# Patient Record
Sex: Male | Born: 1950
Health system: Southern US, Community
[De-identification: ages and names within clinical notes are randomized; demographics above are authoritative.]

## PROBLEM LIST (undated history)

## (undated) DIAGNOSIS — I219 Acute myocardial infarction, unspecified: Secondary | ICD-10-CM

## (undated) DIAGNOSIS — E119 Type 2 diabetes mellitus without complications: Secondary | ICD-10-CM

## (undated) DIAGNOSIS — Z720 Tobacco use: Secondary | ICD-10-CM

## (undated) DIAGNOSIS — I10 Essential (primary) hypertension: Secondary | ICD-10-CM

## (undated) DIAGNOSIS — F1910 Other psychoactive substance abuse, uncomplicated: Secondary | ICD-10-CM

## (undated) DIAGNOSIS — E785 Hyperlipidemia, unspecified: Secondary | ICD-10-CM

## (undated) DIAGNOSIS — J449 Chronic obstructive pulmonary disease, unspecified: Secondary | ICD-10-CM

## (undated) DIAGNOSIS — I4892 Unspecified atrial flutter: Secondary | ICD-10-CM

## (undated) DIAGNOSIS — F329 Major depressive disorder, single episode, unspecified: Secondary | ICD-10-CM

## (undated) DIAGNOSIS — Z89612 Acquired absence of left leg above knee: Secondary | ICD-10-CM

## (undated) DIAGNOSIS — F319 Bipolar disorder, unspecified: Secondary | ICD-10-CM

## (undated) DIAGNOSIS — R350 Frequency of micturition: Secondary | ICD-10-CM

## (undated) DIAGNOSIS — R7302 Impaired glucose tolerance (oral): Secondary | ICD-10-CM

## (undated) DIAGNOSIS — M1990 Unspecified osteoarthritis, unspecified site: Secondary | ICD-10-CM

## (undated) DIAGNOSIS — Z86718 Personal history of other venous thrombosis and embolism: Secondary | ICD-10-CM

## (undated) DIAGNOSIS — E559 Vitamin D deficiency, unspecified: Secondary | ICD-10-CM

## (undated) DIAGNOSIS — F419 Anxiety disorder, unspecified: Secondary | ICD-10-CM

## (undated) DIAGNOSIS — I2510 Atherosclerotic heart disease of native coronary artery without angina pectoris: Secondary | ICD-10-CM

## (undated) DIAGNOSIS — R0602 Shortness of breath: Secondary | ICD-10-CM

## (undated) DIAGNOSIS — I739 Peripheral vascular disease, unspecified: Secondary | ICD-10-CM

## (undated) DIAGNOSIS — I4891 Unspecified atrial fibrillation: Secondary | ICD-10-CM

## (undated) DIAGNOSIS — F32A Depression, unspecified: Secondary | ICD-10-CM

## (undated) HISTORY — DX: Bipolar disorder, unspecified: F31.9

## (undated) HISTORY — DX: Tobacco use: Z72.0

## (undated) HISTORY — DX: Depression, unspecified: F32.A

## (undated) HISTORY — PX: ELECTROCARDIOGRAM: SHX264

## (undated) HISTORY — PX: CARDIAC CATHETERIZATION: SHX172

## (undated) HISTORY — PX: OTHER SURGICAL HISTORY: SHX169

## (undated) HISTORY — DX: Hyperlipidemia, unspecified: E78.5

## (undated) HISTORY — DX: Major depressive disorder, single episode, unspecified: F32.9

## (undated) HISTORY — DX: Chronic obstructive pulmonary disease, unspecified: J44.9

## (undated) HISTORY — DX: Atherosclerotic heart disease of native coronary artery without angina pectoris: I25.10

## (undated) HISTORY — DX: Acute myocardial infarction, unspecified: I21.9

## (undated) HISTORY — DX: Impaired glucose tolerance (oral): R73.02

## (undated) HISTORY — DX: Personal history of other venous thrombosis and embolism: Z86.718

## (undated) HISTORY — PX: VASECTOMY: SHX75

---
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
## 2000-07-26 ENCOUNTER — Ambulatory Visit (HOSPITAL_COMMUNITY): Admission: RE | Admit: 2000-07-26 | Discharge: 2000-07-26 | Payer: Self-pay

## 2006-07-22 ENCOUNTER — Inpatient Hospital Stay (HOSPITAL_COMMUNITY): Admission: EM | Admit: 2006-07-22 | Discharge: 2006-07-28 | Payer: Self-pay | Admitting: *Deleted
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
## 2010-07-04 ENCOUNTER — Inpatient Hospital Stay (HOSPITAL_COMMUNITY)
Admission: EM | Admit: 2010-07-04 | Discharge: 2010-07-07 | DRG: 249 | Disposition: A | Discharge status: Home / Self Care | Payer: Self-pay | Source: Other Acute Inpatient Hospital | Attending: Internal Medicine | Admitting: Internal Medicine

## 2010-07-04 DIAGNOSIS — I498 Other specified cardiac arrhythmias: Secondary | ICD-10-CM | POA: Diagnosis not present

## 2010-07-04 DIAGNOSIS — F17200 Nicotine dependence, unspecified, uncomplicated: Secondary | ICD-10-CM | POA: Diagnosis present

## 2010-07-04 DIAGNOSIS — R7401 Elevation of levels of liver transaminase levels: Secondary | ICD-10-CM | POA: Diagnosis present

## 2010-07-04 DIAGNOSIS — I2119 ST elevation (STEMI) myocardial infarction involving other coronary artery of inferior wall: Principal | ICD-10-CM | POA: Diagnosis present

## 2010-07-04 DIAGNOSIS — F319 Bipolar disorder, unspecified: Secondary | ICD-10-CM | POA: Diagnosis present

## 2010-07-04 DIAGNOSIS — Z86718 Personal history of other venous thrombosis and embolism: Secondary | ICD-10-CM

## 2010-07-04 DIAGNOSIS — Y840 Cardiac catheterization as the cause of abnormal reaction of the patient, or of later complication, without mention of misadventure at the time of the procedure: Secondary | ICD-10-CM | POA: Diagnosis present

## 2010-07-04 DIAGNOSIS — J4489 Other specified chronic obstructive pulmonary disease: Secondary | ICD-10-CM | POA: Diagnosis present

## 2010-07-04 DIAGNOSIS — I2510 Atherosclerotic heart disease of native coronary artery without angina pectoris: Secondary | ICD-10-CM | POA: Diagnosis present

## 2010-07-04 DIAGNOSIS — J449 Chronic obstructive pulmonary disease, unspecified: Secondary | ICD-10-CM | POA: Diagnosis present

## 2010-07-04 DIAGNOSIS — I9589 Other hypotension: Secondary | ICD-10-CM | POA: Diagnosis not present

## 2010-07-04 DIAGNOSIS — R7402 Elevation of levels of lactic acid dehydrogenase (LDH): Secondary | ICD-10-CM | POA: Diagnosis present

## 2010-07-04 DIAGNOSIS — E785 Hyperlipidemia, unspecified: Secondary | ICD-10-CM | POA: Diagnosis present

## 2010-07-04 LAB — CARDIAC PANEL(CRET KIN+CKTOT+MB+TROPI)
Relative Index: 12.8 — ABNORMAL HIGH (ref 0.0–2.5)
Total CK: 1527 U/L — ABNORMAL HIGH (ref 7–232)

## 2010-07-04 LAB — MRSA PCR SCREENING: MRSA by PCR: NEGATIVE

## 2010-07-05 LAB — CBC
HCT: 39 % (ref 39.0–52.0)
Hemoglobin: 14 g/dL (ref 13.0–17.0)
MCH: 31.4 pg (ref 26.0–34.0)
MCHC: 35.9 g/dL (ref 30.0–36.0)
MCV: 87.4 fL (ref 78.0–100.0)
RDW: 12.5 % (ref 11.5–15.5)

## 2010-07-05 LAB — COMPREHENSIVE METABOLIC PANEL
ALT: 41 U/L (ref 0–53)
Alkaline Phosphatase: 51 U/L (ref 39–117)
BUN: 8 mg/dL (ref 6–23)
Chloride: 104 mEq/L (ref 96–112)
GFR calc Af Amer: >60 mL/min (ref >60)
Glucose, Bld: 107 mg/dL — ABNORMAL HIGH (ref 70–99)
Potassium: 3.6 mEq/L (ref 3.5–5.1)
Sodium: 137 mEq/L (ref 135–145)
Total Bilirubin: 0.5 mg/dL (ref 0.3–1.2)
Total Protein: 5.8 g/dL — ABNORMAL LOW (ref 6.0–8.3)

## 2010-07-05 LAB — HEMOGLOBIN A1C
Hgb A1c MFr Bld: 6.2 % — ABNORMAL HIGH (ref <5.7)
Mean Plasma Glucose: 131 mg/dL — ABNORMAL HIGH (ref <117)

## 2010-07-05 LAB — LIPID PANEL
Total CHOL/HDL Ratio: 4.2 RATIO
VLDL: 23 mg/dL (ref 0–40)

## 2010-07-05 LAB — TSH: TSH: 0.242 u[IU]/mL — ABNORMAL LOW (ref 0.350–4.500)

## 2010-07-05 LAB — CARDIAC PANEL(CRET KIN+CKTOT+MB+TROPI): CK, MB: 192.1 ng/mL (ref 0.3–4.0)

## 2010-07-06 LAB — COMPREHENSIVE METABOLIC PANEL
AST: 52 U/L — ABNORMAL HIGH (ref 0–37)
Albumin: 3 g/dL — ABNORMAL LOW (ref 3.5–5.2)
Calcium: 7.5 mg/dL — ABNORMAL LOW (ref 8.4–10.5)
Creatinine, Ser: 0.73 mg/dL (ref 0.50–1.35)
Sodium: 139 mEq/L (ref 135–145)

## 2010-07-06 LAB — CBC
MCH: 31.1 pg (ref 26.0–34.0)
MCV: 88.9 fL (ref 78.0–100.0)
Platelets: 199 10*3/uL (ref 150–400)
RDW: 12.6 % (ref 11.5–15.5)
WBC: 10.8 10*3/uL — ABNORMAL HIGH (ref 4.0–10.5)

## 2010-07-06 LAB — T4, FREE: Free T4: 1.02 ng/dL (ref 0.80–1.80)

## 2010-07-07 DIAGNOSIS — I2510 Atherosclerotic heart disease of native coronary artery without angina pectoris: Secondary | ICD-10-CM

## 2010-07-10 NOTE — Cardiovascular Report (Signed)
NAMEEMRAN, MOLZAHN NO.:  [id number]  MEDICAL RECORD NO.:  [id number]  LOCATION:  2906                         FACILITY:  [facility]  PHYSICIAN:  Nicki Guadalajara, M.D.     DATE OF BIRTH:  09-22-1950  DATE OF PROCEDURE:  07/04/2010 DATE OF DISCHARGE:                           CARDIAC CATHETERIZATION   INDICATIONS:  Mr. Vedanth Sirico is a 60 year old gentleman who has a history of bipolar disorder and ongoing 2-pack-per-day tobacco use.  The patient has been having stuttering chest pain for the past 2 days. Apparently today, he presented to [facility] Emergency Room.  An ECG showed ST-segment elevation inferior wall myocardial infarction.  A code STEMI was called and the patient was transported to [facility].  Upon arrival to [facility], the patient still had recurrent chest tightness.  He was bradycardic with pulses in the low 40s, which in one occasion had dropped into the 30s.  His right femoral artery was punctured anteriorly, and a 6-French arterial sheath and 7-French venous sheath were inserted without difficulty.  Due to the patient's significant bradycardia, a transvenous temporary pacemaker was inserted and advanced to the RV apex with excellent capture and thresholds.  A 6- [place] diagnostic left catheter was then inserted and selective angiography was performed.  A right guiding catheter, 6-French was then inserted and angiography confirmed total mid RCA occlusion with TIMI 0 flow.  Apparently, the patient was treated with 600 mg of Plavix prior to transfer from [facility].  He was started on bivalirudin in the laboratory.  Asahi medium wire was advanced down the RCA and was able to cross the occlusion partially.  A 2.5 x 15 mm Emerge Monorail balloon was then inserted.  This provide additional support to totally cross the lesion and several dilatations were performed at 2,4, 8 and 10 atmospheres.  After initial opening, there was  significant clot burden. An Expressway thrombectomy catheter was then inserted and 3 runs were made with significant clot retrieval.  A 3.0 x 15-mm Trek balloon was then used for additional dilatation prior to stenting.  Due to concerns of potential medical compliance with Plavix long-term with a large- caliber RCA, decision was made to use a bare-metal stent.  A Multilink Vision 3.5 x 18-mm stent was then inserted and successfully deployed x2 up to 13 atmospheres.  A 4.0 x 12-mm noncompliant Trek was used for post- stent dilatation with dilatation up to 3.91 mm.  The patient did have transient hypotension at the start of the study requiring initiation of dopamine up to 4 mcg.  Scout angiography confirmed an excellent angiographic result.  A 6-French pigtail catheter was then inserted and left ventriculography was performed.  Distal aortography was done.  HEMODYNAMIC DATA:  Central aortic pressure was initially 75/54 at the completion of the procedure, central aortic pressure was approximately 100/60 and left ventricular pressure was 103/6.  Post A-wave 16.  ANGIOGRAPHIC DATA:  The left main coronary artery was a large vessel, which bifurcated into an LAD and left circumflex system.  There was mild 10-20% distal taper of the left main.  The LAD was a moderate-sized vessel that gave rise to a small  high diagonal ramus intermediate-like vessel.  The proximal LAD had irregularity with narrowing of 40% to less than 50% in the region of the first septal perforating artery takeoff.  In the midportion of the LAD, there was luminal irregularity with narrowings of 20-30%.  LAD extended to the apex.  Collateralization of PLA branch of the RCA was visualized from the left injection.  The circumflex vessel was moderate-sized vessel that gave rise to one large diagonal vessel and also supplied left atrial circumflex branch.  The right coronary artery was a large-caliber vessel that had  30% proximal narrowing.  The RCA was totally occluded in its midsegment after the takeoff of small anterior RV marginal branch.  There was initial TIMI 0 flow.  As noted above, temporary pacemaker was in place for the intervention and the patient was started on dopamine for hypotension and fluids were increased.  Following initial dilatation at the initial occluded site, there was significant thrombus burden beyond this segment.  Expressway thrombectomy was performed with significant clot removal.  Following additional dilatation and subsequent stenting with a 3.5 x 18-mm bare- metal Vision stent, postdilated to 3.91 mm, the 100% occlusion was reduced to 0%.  There was now brisk TIMI 3 flow and the vessel supplied a large PDA and posterolateral vessel.  RAO ventriculography revealed mild LV dysfunction acutely with an ejection fraction of approximately 50% with mild inferior hypocontractility.  Distal aortography revealed widely patent renal arteries without significant aortoiliac disease.  IMPRESSION: 1. Acute ST-segment elevation inferior wall myocardial infarction     secondary to total occlusion of the mid right coronary artery. 2. Transient bradycardia to heart rate down to 38 requiring temporary     pacemaker insertion. 3. Hypotension requiring inotropic support with dopamine. 4. Mild 10-20% distal tapering of the left main, with 40-50% proximal     left anterior descending stenoses and luminal irregularities of 20-     30% in the mid segment of the left anterior descending with initial     left-to-right collateralization to the posterolateral artery branch     of the RCA. 5. Normal left circumflex coronary artery. 6. Total occlusion of the mid right coronary artery with initial TIMI     0 flow. 7. Successful percutaneous coronary intervention requiring initial     thrombectomy, percutaneous transluminal coronary angioplasty and     ultimate stenting with a 3.5 x 18-mm  bare-metal Vision stent,     postdilated 3.91 mm. 8. Because of haziness and clot during the procedure, the patient was     also started on Integrilin in addition to bivalirudin and had     received 600 mg oral Plavix prior to transfer to [facility].  ESTIMATED DOOR-TO-BALLOON TIME:  39 minutes.          ______________________________ Nicki Guadalajara, M.D.     TK/MEDQ  D:  07/04/2010  T:  07/05/2010  Job:  [id number]  cc:   Bevelyn Buckles. Bensimhon, MD  Electronically Signed by Nicki Guadalajara M.D. on 07/10/2010 04:30:52 PM

## 2010-07-13 NOTE — Discharge Summary (Addendum)
Anthony [name], [name] NO.:  [id number]  MEDICAL RECORD NO.:  [id number]  LOCATION:  2003                         FACILITY:  [facility]  PHYSICIAN:  Marca Ancona, MD      DATE OF BIRTH:  09/26/1950  DATE OF ADMISSION:  07/04/2010 DATE OF DISCHARGE:  07/07/2010                              DISCHARGE SUMMARY   DISCHARGE DIAGNOSES: 1. Newly diagnosed coronary artery disease this admission with acute     inferior ST-elevation myocardial infarction.     a.     Status post percutaneous coronary intervention with      thrombectomy, percutaneous transluminal coronary angioplasty, and      bare-metal stent to the mid right coronary artery. 2. Bradycardia requiring temporary pacemaker in the cath lab, on July 04, 2010, currently tolerating low-dose beta-blocker. 3. Hypotension, requiring dopamine, stable. 4. Bipolar disorder with stable mental status this admission. 5. History of traumatic deep vein thrombosis, off Coumadin. 6. Ongoing tobacco abuse. 7. Mild transaminitis, improved. 8. Dyslipidemia, total cholesterol 164, triglycerides 114, HDL 40, LDL     104. 9. Abnormal initial TSH was 0.242, repeat TSH with free T4 normal.  HOSPITAL COURSE:  Mr. Anthony [name] is a 60 year old gentleman with history of hyperlipidemia, bipolar disorder, and COPD with ongoing tobacco abuse as well as prior history of DVT.  He developed chest pain on the day of admission, but per report, initially ignored it, went to [employer] to drop off the books.  However, the chest pain got so bad, so he presented to the [facility] ER and there, EKG showed inferolateral ST elevation. Code STEMI was activated.  In the ER, he was treated with morphine, heparin, and 600 mg of Plavix.  He was subsequently transferred to [facility] and upon arrival, was still having chest pain approximately 4-5/10.  He underwent emergent cardiac catheterization by Dr. Tresa Endo who was the STEMI doctor on-call,  demonstrating total occlusion of the mid RCA which was subsequently intervened upon with thrombectomy, PTCA, and ultimately stenting with a bare-metal stent. Because of haziness and clot during the procedure, the patient was started on Integrilin in addition to Angiomax.  He had nonobstructive residual disease in the left main, proximal LAD, and mid segment of the LAD.  He also had transient bradycardia with heart rate down to 38, requiring temporary pacemaker implantation.  The morning after procedure, the patient was doing well.  He was no longer relying on a temporary pacemaker with intrinsic rate in the mid 50s.  The temporary pacemaker was discontinued, and his dopamine was weaned down.  He was seen by cardiac rehab as well as tobacco cessation for education and ambulation.  His TSH was checked which was noted to be suppressed, however, repeat TSH and free T4 were normal.  He had mild transaminitis as well but this was thought secondary to his MI and repeat check demonstrated an AST of 52 and normal ALT.  On the day of discharge, the patient is doing well without chest pain.  Dr. Shirlee Latch has seen and examined him and feels he is stable for discharge today.  He has also been seen by  case Production designer, theatre/television/film for the [employer].  DISCHARGE LABS:  WBC 10.8, hemoglobin 14.3, hematocrit 40.9, platelet count 199.  Sodium 139, potassium 4.2, chloride 106, CO2 24, glucose 101, BUN 13, creatinine 0.73.  A1c 6.2.  Troponin greater than 25, total cholesterol 167, triglycerides 114, HDL 40, LDL 104.  TSH 1.157, free T4 1.02.  STUDIES:  Cardiac catheterization on July 04, 2010, please see full report for details.  DISCHARGE MEDICATIONS: 1. Lovastatin 40 mg at bedtime. 2. Metoprolol tartrate 25 mg 1/2 tablet b.i.d. 3. Nicotine patch 21-mg patch 1 daily for 6 weeks, 14 -mg patch 1     daily for 2 weeks, 7-mg patch 1 daily for 2 weeks. 4. Nitro sublingual 0.4 mg every 5 minutes as needed up  to 3 doses for     chest pain. 5. Effient 10 mg daily. 6. Aleve 220 mg daily as needed with instructions to only use     sparingly as this medicine can increase the risk of systemic     bleeding with medicines like aspirin and Plavix. 7. Aspirin 81 mg daily. 8. Multivitamin 1 tablet daily. 9. Xanax 1 mg daily as needed for anxiety.  DISPOSITION:  Anthony [name] will be discharged in stable condition home.  He is not to lift anything over 5 pounds for 1 week, drive for 2 days, or participate in sexual activity for 1 week.  He is to follow a heart- healthy diet.  Per discussion with Dr. Shirlee Latch, he may return to work in 2 weeks.  He denies needing work note for that.  He is to call or return if he notices any pain, swelling, bleeding, or pus at the cath site.  He will follow up with [facility] in our [place] office and we will arrange this appointment.  He lives in [place] postal address, but actually is only 12 miles from the [place] office.  He is also instructed to follow up with his primary care provider for continued to surveillance of his blood sugar as his A1c was elevated this admission at 6.2.DURATION OF DISCHARGE ENCOUNTER:  Greater than 30 minutes including physician and PA time.     Ronie Spies, P.A.C.   ______________________________ Marca Ancona, MD    DD/MEDQ  D:  07/07/2010  T:  07/07/2010  Job:  [id number]  cc:   [facility]  Electronically Signed by Ronie Spies  on 07/13/2010 01:45:04 PM Electronically Signed by Marca Ancona MD on 07/27/2010 01:28:48 PM

## 2010-07-13 NOTE — H&P (Signed)
NAMEPHYLLIS, WHITEFIELD NO.:  [id number]  MEDICAL RECORD NO.:  [id number]  LOCATION:  [facility]                         FACILITY:  [facility]  Anthony [name]:  Bevelyn Buckles. Bensimhon, MDDATE OF BIRTH:  1950-06-14  DATE OF ADMISSION:  07/04/2010 DATE OF DISCHARGE:                             HISTORY & PHYSICAL   PRIMARY CARE Anthony [name]:  [facility].  CARDIOLOGY:  He is new to [facility].  HISTORY OF PRESENT ILLNESS:  Mr. Beirne is a 60 year old male with history of hyperlipidemia, bipolar disorder, and COPD with ongoing tobacco use 2 packs per day and previous DVT.  He denies any history of known coronary artery disease.  He has had stuttering chest pain, since Sunday.  Today, the pain got worse.  He finally went to the [facility] ER. In the ER, the EKG showed inferolateral ST-elevation and a code STEMI was activated.  In the ER, he was treated with morphine, heparin, and 600 mg of Plavix.  He was transferred emergently to [facility] Lab.  On arrival, he continues to have some chest pain, this is mild about 4- 5/10.  REVIEW OF SYSTEMS:  He denies any heart failure symptoms.  No bleeding. No palpitations.  He does have bipolar disorder with depression.  He has not been on any meds for this.  Remainder review of systems are negative except for HPI and problem list.  PROBLEM LIST: 1. Hyperlipidemia. 2. Bipolar disorder. 3. COPD with ongoing tobacco use. 4. History of traumatic DVT several years ago, treated with Coumadin,     now off.  CURRENT MEDICATIONS:  Xanax.  ALLERGIES:  PENICILLIN.  SOCIAL HISTORY:  He lives in [place].  He lives alone.  He has kids, but is not married.  Tobacco 2 packs per day.  Alcohol occasional.  FAMILY HISTORY:  Both mother and father are deceased.  Father had a history of coronary artery disease.  PHYSICAL EXAMINATION:  GENERAL:  He is lying on a cath lab in moderate distress. VITAL SIGNS:  Blood pressure  95/61 and heart rate 45. HEENT:  Normal except for poor dentition. NECK:  Supple.  Carotids are 1+ bilaterally with no obvious bruits. CARDIAC:  PMI is not palpable.  He has distant heart sounds.  No obvious murmur. LUNGS:  Decreased breath sounds throughout. ABDOMEN: Soft and nontender.  Good bowel sounds. EXTREMITIES:  Warm with no cyanosis or clubbing. NEUROLOGIC:  He is alert and oriented x3.  Cranial nerves II through XII are grossly intact.  Moves all 4 extremities without difficulty.  Affect is reasonable.  LABORATORY DATA:  White count is 13.3, hemoglobin 16.0, and platelets are 277,000.  Sodium 136, potassium 3.9, BUN 10, creatinine 0.9, and glucose 109.  CK is 445, MB is 29, and troponin is 0.64.  EKG shows sinus rhythm at a rate of 63 with inferolateral ST elevation.  ASSESSMENT: 1. Inferolateral ST elevation myocardial infarction. 2. Chronic obstructive pulmonary disease with ongoing tobacco use. 3. Bipolar disorder. 4. Hyperlipidemia. 5. History of traumatic deep vein thrombosis several years ago.  PLAN/DISCUSSION:  We will proceed with emergent cardiac catheterization with Dr. Tresa Endo.  He will need a smoking cessation consult  and aggressive risk factor management after his catheterization.     Bevelyn Buckles. Bensimhon, MD     DRB/MEDQ  D:  07/04/2010  T:  07/04/2010  Job:  [id number]  Electronically Signed by Arvilla Meres MD on 07/13/2010 03:31:07 PM

## 2010-07-25 ENCOUNTER — Encounter: Payer: Self-pay | Admitting: Physician Assistant

## 2010-07-26 ENCOUNTER — Encounter: Payer: Self-pay | Admitting: Physician Assistant

## 2010-07-27 ENCOUNTER — Encounter: Payer: Self-pay | Admitting: *Deleted

## 2010-07-27 ENCOUNTER — Encounter: Payer: Self-pay | Admitting: Internal Medicine

## 2010-07-28 ENCOUNTER — Encounter: Payer: Self-pay | Admitting: Physician Assistant

## 2010-07-28 ENCOUNTER — Ambulatory Visit (INDEPENDENT_AMBULATORY_CARE_PROVIDER_SITE_OTHER): Payer: Self-pay | Admitting: Physician Assistant

## 2010-07-28 VITALS — BP 128/84 | HR 67 | Resp 16 | Ht 70.0 in | Wt 187.0 lb

## 2010-07-28 DIAGNOSIS — F32A Depression, unspecified: Secondary | ICD-10-CM | POA: Insufficient documentation

## 2010-07-28 DIAGNOSIS — E785 Hyperlipidemia, unspecified: Secondary | ICD-10-CM | POA: Insufficient documentation

## 2010-07-28 DIAGNOSIS — F329 Major depressive disorder, single episode, unspecified: Secondary | ICD-10-CM | POA: Insufficient documentation

## 2010-07-28 DIAGNOSIS — Z720 Tobacco use: Secondary | ICD-10-CM | POA: Insufficient documentation

## 2010-07-28 DIAGNOSIS — I2510 Atherosclerotic heart disease of native coronary artery without angina pectoris: Secondary | ICD-10-CM | POA: Insufficient documentation

## 2010-07-28 DIAGNOSIS — J449 Chronic obstructive pulmonary disease, unspecified: Secondary | ICD-10-CM | POA: Insufficient documentation

## 2010-07-28 DIAGNOSIS — J441 Chronic obstructive pulmonary disease with (acute) exacerbation: Secondary | ICD-10-CM | POA: Insufficient documentation

## 2010-07-28 DIAGNOSIS — E1169 Type 2 diabetes mellitus with other specified complication: Secondary | ICD-10-CM | POA: Insufficient documentation

## 2010-07-28 DIAGNOSIS — F17200 Nicotine dependence, unspecified, uncomplicated: Secondary | ICD-10-CM

## 2010-07-28 LAB — BASIC METABOLIC PANEL
CO2: 28 mEq/L (ref 19–32)
Calcium: 9.2 mg/dL (ref 8.4–10.5)
Chloride: 104 mEq/L (ref 96–112)
Potassium: 4.4 mEq/L (ref 3.5–5.1)
Sodium: 139 mEq/L (ref 135–145)

## 2010-07-28 NOTE — Assessment & Plan Note (Signed)
Followup with his psychiatrist for continued evaluation.

## 2010-07-28 NOTE — Assessment & Plan Note (Signed)
He understands the importance of cessation.  I have reminded him to not smoke with the nicotine patch on.

## 2010-07-28 NOTE — Assessment & Plan Note (Signed)
Arrange for lipids and LFTs through the [facility] Department in 6-8 weeks

## 2010-07-28 NOTE — Assessment & Plan Note (Signed)
Repeat basic metabolic panel today

## 2010-07-28 NOTE — Assessment & Plan Note (Signed)
Doing well post inferior MI.  He had preserved LV function at cardiac catheterization.  He is tolerating his medications well.  Continue aspirin, Effient, statin and beta blocker.  I will provide him with Effient samples today if available.  We will check on his Effient assistance paperwork to make sure that this is completed.  I will refer him to [facility] cardiac rehabilitation.  Followup with Dr. Gala Romney in 2 months.

## 2010-07-28 NOTE — Patient Instructions (Signed)
Your physician recommends that you schedule a follow-up appointment in: 2 MONTHS WITH DR. Gala Romney AS PER SCOTT WEAVER, PA-C  You have been referred to CARDIAC REHAB AT [place] FOR CAD 414.00   DO NOT SMOKE WHILE USING THE NICOTINE PATCH.  Your physician recommends that you return for lab work in: TODAY BMET 275.41

## 2010-07-28 NOTE — Progress Notes (Signed)
History of Present Illness: Primary Cardiologist:  Dr. Arvilla Meres PCP:  [facility] Psych:  [place] [facility]  Anthony [name] is a 61 y.o. male who presents for post hospital follow up.  He presented to [facility] on 6/26 in transfer from [facility] with an inferior ST elevation myocardial infarction.  He was taken to the cardiac catheterization lab.  Cardiac catheterization demonstrated a totally occluded mid RCA.  This was treated with a Vision bare metal stent.  Residual disease included 10-20% distal left main stenosis, 40-50% proximal LAD and 20-30% mid LAD, 30% proximal RCA.  Left ventriculogram demonstrated inferior hypokinesis with an EF of 50%.  He did have hypotension and bradycardia post PCI.  This required temporary pacemaker implantation and dopamine.  He was eventually weaned off of both of these and maintained an adequate blood pressure and heart rate.  He was started on lovastatin for dyslipidemia.  His LDL was 104.  He was noted to have elevated sugars with a A1c of 6.2 (glucose intolerance).  He was set up with assistance for Effient.  He returns for followup.  Hospital labs: Potassium 4.2, creatinine 0.73, calcium 7.5, hemoglobin 14.3, ALT 34, A1c 6.2, TSH 1.157, TC 167, TG 114, HDL 40, LDL 104; peak troponin greater than 25.  He denies any further chest discomfort consistent with his angina.  He did have one very brief episode of left-sided chest discomfort.  He denies any exertional symptoms.  He is walking infrequently, but without difficulty.  He denies significant shortness of breath.  He is plagued mainly by depression.  He has difficulty with sleeping.  He is due to see a psychiatrist back next week.  The only psych medication he currently takes is Xanax.  This is not helping him sleep.  He denies orthopnea, PND or edema.  He denies syncope or palpitations.  Past Medical History  Diagnosis Date  . CAD (coronary artery disease)     a. INF STEMI 07/04/10:  tx with thrombectomy + Vision BMS to [facility];  cath 07/04/10: dLM 10-20%, pLAD 40-50%, mLAD 20-30%, pRCA 30%, mRCA occluded and tx with PCI, EF 50% with inf HK  . HLD (hyperlipidemia)   . COPD (chronic obstructive pulmonary disease)   . Tobacco abuse   . Bipolar disorder   . History of DVT (deep vein thrombosis)     traumatic, s/p coumadin tx.  . Glucose intolerance (impaired glucose tolerance)     A1c 6.2 06/2010  . Depression     Current Outpatient Prescriptions  Medication Sig Dispense Refill  . ALPRAZolam (XANAX) 0.5 MG tablet Take 0.5 mg by mouth at bedtime as needed.        Marland Kitchen aspirin 81 MG tablet Take 81 mg by mouth daily.        Marland Kitchen lovastatin (MEVACOR) 40 MG tablet Take 40 mg by mouth at bedtime.        . metoprolol tartrate (LOPRESSOR) 25 MG tablet Take 25 mg by mouth. 1/2 po bid       . naproxen sodium (ANAPROX) 220 MG tablet Take 220 mg by mouth 2 (two) times daily with a meal.        . nicotine (NICODERM CQ - DOSED IN MG/24 HOURS) 21 mg/24hr patch Place 1 patch onto the skin daily.        . nitroGLYCERIN (NITROSTAT) 0.4 MG SL tablet Place 0.4 mg under the tongue every 5 (five) minutes as needed.        Marland Kitchen  prasugrel (EFFIENT) 10 MG TABS Take 10 mg by mouth daily.          Allergies: Allergies  Allergen Reactions  . Penicillins     Social history:  He continues to smoke cigarettes.  Vital Signs: BP 128/84  Pulse 67  Resp 16  Ht 5\' 10"  (1.778 m)  Wt 187 lb (84.823 kg)  BMI 26.83 kg/m2  PHYSICAL EXAM: Well nourished, well developed, in no acute distress HEENT: normal Neck: no JVD Vascular: No carotid bruits Cardiac:  normal S1, S2; RRR; no murmur Lungs:  clear to auscultation bilaterally, no wheezing, rhonchi or rales Abd: soft, nontender, no hepatomegaly Ext: no edema; RFA site without hematoma or bruit Skin: warm and dry Neuro:  CNs 2-12 intact, no focal abnormalities noted Psych: Flat affect  EKG:  Sinus rhythm, heart rate 67, inferior Q  waves with associated T-wave inversions (evolving Inferior MI), poor R-wave progression, T wave inversion in lead V6  ASSESSMENT AND PLAN:

## 2010-09-14 ENCOUNTER — Telehealth: Payer: Self-pay | Admitting: Internal Medicine

## 2010-09-14 NOTE — Telephone Encounter (Signed)
Pt would like samples of Effient until his medication comes in.  Please call him when samples are ready for pick up.

## 2010-09-15 NOTE — Telephone Encounter (Signed)
Pt calling wanting to know if he can get some effient 10mg  samples. Pt is unable to afford RX at this time. Pt has been out of RX for one week now. Please return pt call to discuss further. Pt having some phone troubles and will call back with a more reliable number. Pt can hear person talked however, person talking cannot hear pt. Pt asks that we leave a detailed message.

## 2010-09-15 NOTE — Telephone Encounter (Signed)
N/A.  LMTC.  Work number has been disconnected.

## 2010-09-15 NOTE — Telephone Encounter (Signed)
7 days of samples (all we have),  were left at the front desk for pt.  He will call back next week to see if we have more samples available.

## 2010-09-21 ENCOUNTER — Telehealth: Payer: Self-pay | Admitting: Internal Medicine

## 2010-09-21 NOTE — Telephone Encounter (Signed)
Pt called

## 2010-09-21 NOTE — Telephone Encounter (Signed)
Samples of effient.

## 2010-09-21 NOTE — Telephone Encounter (Signed)
Samples left at front desk 

## 2010-09-21 NOTE — Telephone Encounter (Signed)
Pt rtn call to debbie re effient supply, pls call [phone]

## 2010-09-26 ENCOUNTER — Ambulatory Visit (INDEPENDENT_AMBULATORY_CARE_PROVIDER_SITE_OTHER): Payer: Self-pay | Admitting: Internal Medicine

## 2010-09-26 ENCOUNTER — Encounter: Payer: Self-pay | Admitting: Internal Medicine

## 2010-09-26 DIAGNOSIS — F17200 Nicotine dependence, unspecified, uncomplicated: Secondary | ICD-10-CM

## 2010-09-26 DIAGNOSIS — Z720 Tobacco use: Secondary | ICD-10-CM

## 2010-09-26 DIAGNOSIS — F329 Major depressive disorder, single episode, unspecified: Secondary | ICD-10-CM

## 2010-09-26 DIAGNOSIS — I2510 Atherosclerotic heart disease of native coronary artery without angina pectoris: Secondary | ICD-10-CM

## 2010-09-26 NOTE — Assessment & Plan Note (Signed)
Discussed smoking cessation however he is not currently interested in smoking cessation.

## 2010-09-26 NOTE — Progress Notes (Signed)
History of Present Illness: Primary Cardiologist:  Dr. Arvilla Meres PCP:  [facility] Psych:  [place] [facility]  Anthony [name] is a 60 y.o. male who presents for post hospital follow up.  He presented to [facility] on 6/26 in transfer from [facility] with an inferior ST elevation myocardial infarction.  He was taken to the cardiac catheterization lab.  Cardiac catheterization demonstrated a totally occluded mid RCA.  This was treated with a Vision bare metal stent.  Residual disease included 10-20% distal left main stenosis, 40-50% proximal LAD and 20-30% mid LAD, 30% proximal RCA.  Left ventriculogram demonstrated inferior hypokinesis with an EF of 50%.  He did have hypotension and bradycardia post PCI.  This required temporary pacemaker implantation and dopamine.  He was eventually weaned off of both of these and maintained an adequate blood pressure and heart rate.  He was started on lovastatin for dyslipidemia.  His LDL was 104.  He was noted to have elevated sugars with a A1c of 6.2 (glucose intolerance).  He was set up with assistance for Effient.  He returns for followup.  Hospital labs: Potassium 4.2, creatinine 0.73, calcium 7.5, hemoglobin 14.3, ALT 34, A1c 6.2, TSH 1.157, TC 167, TG 114, HDL 40, LDL 104;  troponin greater than 25.  Overall doing ok. Complains of fatigue . He continues to smoke 1 pack 3-4 days. He denies any further chest discomfort consistent with his angina. No dyspnea. He complains of depression and plans to follow up with his pyschiatrist 11-27. Currently not taking any anti depressants. He does walk 20 minute a day. He did not follow up with cardiac rehab. Taking all meds. Wanting to stop Effient.   i Past Medical History  Diagnosis Date  . CAD (coronary artery disease)     a. INF STEMI 07/04/10:  tx with thrombectomy + Vision BMS to [facility];  cath 07/04/10: dLM 10-20%, pLAD 40-50%, mLAD 20-30%, pRCA 30%, mRCA occluded and tx  with PCI, EF 50% with inf HK  . HLD (hyperlipidemia)   . COPD (chronic obstructive pulmonary disease)   . Tobacco abuse   . Bipolar disorder   . History of DVT (deep vein thrombosis)     traumatic, s/p coumadin tx.  . Glucose intolerance (impaired glucose tolerance)     A1c 6.2 06/2010  . Depression     Current Outpatient Prescriptions  Medication Sig Dispense Refill  . ALPRAZolam (XANAX) 0.5 MG tablet Take 0.5 mg by mouth at bedtime as needed.        Marland Kitchen aspirin 81 MG tablet Take 81 mg by mouth daily.        Marland Kitchen lovastatin (MEVACOR) 40 MG tablet Take 40 mg by mouth at bedtime.        . metoprolol tartrate (LOPRESSOR) 25 MG tablet Take 25 mg by mouth. 1/2 po bid       . naproxen sodium (ANAPROX) 220 MG tablet Take 220 mg by mouth 2 (two) times daily with a meal.        . nicotine (NICODERM CQ - DOSED IN MG/24 HOURS) 21 mg/24hr patch Place 1 patch onto the skin daily.        . nitroGLYCERIN (NITROSTAT) 0.4 MG SL tablet Place 0.4 mg under the tongue every 5 (five) minutes as needed.        . prasugrel (EFFIENT) 10 MG TABS Take 10 mg by mouth daily.          Allergies: Allergies  Allergen Reactions  . Penicillins     Social history:  He continues to smoke cigarettes.  Vital Signs: BP 112/70  Pulse 68  Resp 18  Ht 5\' 11"  (1.803 m)  Wt 198 lb (89.812 kg)  BMI 27.62 kg/m2  PHYSICAL EXAM: Well nourished, well developed, in no acute distress HEENT: normal Neck: no JVD Vascular: No carotid bruits Cardiac:  normal S1, S2; RRR; no murmur Lungs:  clear to auscultation bilaterally, no wheezing, rhonchi or rales Abd: soft, nontender, no hepatomegaly Ext: no edema Skin: warm and dry Neuro:  CNs 2-12 intact, no focal abnormalities noted Psych: Flat affect    ASSESSMENT AND PLAN:

## 2010-09-26 NOTE — Patient Instructions (Signed)
Your physician has recommended you make the following change in your medication:  Stop your Effient in one month  Your physician wants you to follow-up in: 6 months with Dr Sanjuana Kava.   You will receive a reminder letter in the mail two months in advance. If you don't receive a letter, please call our office to schedule the follow-up appointment.

## 2010-09-26 NOTE — Assessment & Plan Note (Signed)
He is to follow up with his psychiatrist December 05, 2010

## 2010-09-26 NOTE — Assessment & Plan Note (Addendum)
Doing well from a cardiac standpoint. Refuses cardia rehab. Tolerating medications. Will continue Effient for 1 more month than stop. Continue baby aspirin daily. Follow up 6 months.

## 2010-10-23 LAB — BASIC METABOLIC PANEL
BUN: 16
CO2: 26
Calcium: 8.4
Chloride: 103
Creatinine, Ser: 0.82
GFR calc Af Amer: >60
GFR calc non Af Amer: >60
Glucose, Bld: 98
Potassium: 4.3
Sodium: 138

## 2010-10-23 LAB — CBC
HCT: 40
Hemoglobin: 13.6
MCHC: 34.1
MCV: 90.7
Platelets: 238
RBC: 4.4
RDW: 12.5
WBC: 11.2 — ABNORMAL HIGH

## 2010-10-24 LAB — URINALYSIS, ROUTINE W REFLEX MICROSCOPIC
Bilirubin Urine: NEGATIVE
Hgb urine dipstick: NEGATIVE
Specific Gravity, Urine: 1.015
Urobilinogen, UA: 0.2

## 2011-04-19 ENCOUNTER — Ambulatory Visit (INDEPENDENT_AMBULATORY_CARE_PROVIDER_SITE_OTHER): Payer: Self-pay | Admitting: Cardiovascular Disease

## 2011-04-19 ENCOUNTER — Encounter: Payer: Self-pay | Admitting: Cardiovascular Disease

## 2011-04-19 VITALS — BP 122/76 | HR 72 | Ht 71.0 in | Wt 200.0 lb

## 2011-04-19 DIAGNOSIS — I2510 Atherosclerotic heart disease of native coronary artery without angina pectoris: Secondary | ICD-10-CM

## 2011-04-19 NOTE — Progress Notes (Signed)
History of Present Illness: 61 yo male with history of CAD, HLD, tobacco abuse, bipolar disorder, COPD here today for cardiac follow up. He has been followed in the past by Dr. Gala Romney and was last seen in our office September 2012.  He presented to [facility] on 07/04/10 in transfer from [facility] with an inferior ST elevation myocardial infarction. He was taken to the cardiac catheterization lab by Norwood Levo. Cardiac catheterization demonstrated a totally occluded mid RCA. This was treated with a Vision bare metal stent. Residual disease included 10-20% distal left main stenosis, 40-50% proximal LAD and 20-30% mid LAD, 30% proximal RCA. Left ventriculogram demonstrated inferior hypokinesis with an EF of 50%. He did have hypotension and bradycardia post PCI. This required temporary pacemaker implantation and dopamine. He was eventually weaned off of both of these and maintained an adequate blood pressure and heart rate. He was started on lovastatin for dyslipidemia. He continued to smoke.  He did not follow up with cardiac rehab.  He is here today for follow up. He is doing well. No recurrent chest pain or SOB. He continues to smoke. He continues to have some pain in his right groin at cath site. Taking all meds including. His Effient was stopped in September. He has had some issues with decaying teeth. He has been walking and playing tennis.   Primary Care Physician:  None  Last Lipid Profile: TC 167, TG 114, HDL 40, LDL 104;   Past Medical History  Diagnosis Date  . CAD (coronary artery disease)     a. INF STEMI 07/04/10:  tx with thrombectomy + Vision BMS to [facility];  cath 07/04/10: dLM 10-20%, pLAD 40-50%, mLAD 20-30%, pRCA 30%, mRCA occluded and tx with PCI, EF 50% with inf HK. A Multilink  . HLD (hyperlipidemia)   . COPD (chronic obstructive pulmonary disease)   . Tobacco abuse   . Bipolar disorder   . History of DVT (deep vein thrombosis)     traumatic, s/p coumadin tx.  .  Glucose intolerance (impaired glucose tolerance)     A1c 6.2 06/2010  . Depression     Past Surgical History  Procedure Date  . Electrocardiogram     Showed inferolateral ST-elevation and a code STEMI was activated. In the ER, he was treated with morphine, herarin, and 600 mg of Plavix. He was transferred emergently to [facility].     Current Outpatient Prescriptions  Medication Sig Dispense Refill  . ALPRAZolam (XANAX) 0.5 MG tablet Take 0.5 mg by mouth at bedtime as needed.        Marland Kitchen aspirin 81 MG tablet Take 81 mg by mouth daily.        Marland Kitchen lovastatin (MEVACOR) 40 MG tablet Take 40 mg by mouth at bedtime.        . metoprolol tartrate (LOPRESSOR) 25 MG tablet Take 25 mg by mouth. 1/2 po bid       . naproxen sodium (ANAPROX) 220 MG tablet 220 mg. As needed      . nicotine (NICODERM CQ - DOSED IN MG/24 HOURS) 21 mg/24hr patch Place 1 patch onto the skin daily.        . nitroGLYCERIN (NITROSTAT) 0.4 MG SL tablet Place 0.4 mg under the tongue every 5 (five) minutes as needed.          Allergies  Allergen Reactions  . Penicillins     History   Social History  . Marital Status: Divorced  Spouse Name: N/A    Number of Children: N/A  . Years of Education: N/A   Occupational History  . Not on file.   Social History Main Topics  . Smoking status: Current Everyday Smoker -- 2.0 packs/day    Types: Cigarettes  . Smokeless tobacco: Not on file  . Alcohol Use: Not on file     occasionally  . Drug Use: Not on file  . Sexually Active: Not on file   Other Topics Concern  . Not on file   Social History Narrative   Lives in [place]. He lives alone. He has kids but is not married.     Family History  Problem Relation Age of Onset  . Coronary artery disease Father     Review of Systems:  As stated in the HPI and otherwise negative.   BP 122/76  Pulse 72  Ht 5\' 11"  (1.803 m)  Wt 200 lb (90.719 kg)  BMI 27.89 kg/m2  Physical Examination: General: Well developed,  well nourished, NAD HEENT: OP clear, mucus membranes moist SKIN: warm, dry. No rashes. Neuro: No focal deficits Musculoskeletal: Muscle strength 5/5 all ext Psychiatric: Mood and affect normal Neck: No JVD, no carotid bruits, no thyromegaly, no lymphadenopathy. Lungs:Clear bilaterally, no wheezes, rhonci, crackles Cardiovascular: Regular rate and rhythm. No murmurs, gallops or rubs. Abdomen:Soft. Bowel sounds present. Non-tender.  Extremities: No lower extremity edema. Pulses are 2 + in the bilateral DP/PT. No bruit over right femoral artery. No hematoma. No palpable mass right groin

## 2011-04-19 NOTE — Patient Instructions (Signed)
Your physician wants you to follow-up in: 6 months  You will receive a reminder letter in the mail two months in advance. If you don't receive a letter, please call our office to schedule the follow-up appointment.  Your physician recommends that you continue on your current medications as directed. Please refer to the Current Medication list given to you today.  

## 2011-04-19 NOTE — Assessment & Plan Note (Addendum)
Stable. No chest pains. Continue ASA, statin, beta blocker. He has been off of statin. BP is well controlled. Lipids are at goal

## 2011-07-22 ENCOUNTER — Other Ambulatory Visit: Payer: Self-pay | Admitting: Physician Assistant

## 2011-07-23 ENCOUNTER — Other Ambulatory Visit: Payer: Self-pay | Admitting: Physician Assistant

## 2011-07-23 NOTE — Telephone Encounter (Signed)
..   Requested Prescriptions   Pending Prescriptions Disp Refills  . metoprolol tartrate (LOPRESSOR) 25 MG tablet [Pharmacy Med Name: METOPROLOL TART 25MG  TAB] 30 tablet 3    Sig: TAKE ONE-HALF TABLET BY MOUTH TWICE DAILY

## 2011-07-23 NOTE — Telephone Encounter (Signed)
Received call from Brunilda Payor, RN in research that pt had contacted her regarding refill for either Pravastatin or Mevacor. I called [facility] on [address] and confirmed they filled pravastatin today. Prescription for pravastatin sent by [facility].  I will not send Mevacor refill at this time.  Brunilda Payor made aware and she will contact pt with this information

## 2011-07-25 ENCOUNTER — Other Ambulatory Visit: Payer: Self-pay | Admitting: Internal Medicine

## 2011-07-25 MED ORDER — METOPROLOL TARTRATE 25 MG PO TABS: ORAL_TABLET | ORAL | Status: DC

## 2011-09-11 ENCOUNTER — Encounter: Payer: Self-pay | Admitting: Cardiovascular Disease

## 2011-09-11 ENCOUNTER — Ambulatory Visit (INDEPENDENT_AMBULATORY_CARE_PROVIDER_SITE_OTHER): Payer: Self-pay | Admitting: Cardiovascular Disease

## 2011-09-11 VITALS — BP 134/87 | HR 94 | Ht 71.0 in | Wt 204.0 lb

## 2011-09-11 DIAGNOSIS — M79609 Pain in unspecified limb: Secondary | ICD-10-CM

## 2011-09-11 DIAGNOSIS — M79604 Pain in right leg: Secondary | ICD-10-CM

## 2011-09-11 DIAGNOSIS — I25810 Atherosclerosis of coronary artery bypass graft(s) without angina pectoris: Secondary | ICD-10-CM

## 2011-09-11 DIAGNOSIS — R5383 Other fatigue: Secondary | ICD-10-CM

## 2011-09-11 NOTE — Patient Instructions (Signed)
Your physician wants you to follow-up in: 6 months  You will receive a reminder letter in the mail two months in advance. If you don't receive a letter, please call our office to schedule the follow-up appointment.  Your physician recommends that you continue on your current medications as directed. Please refer to the Current Medication list given to you today.  

## 2011-09-11 NOTE — Progress Notes (Signed)
History of Present Illness: 61 yo male with history of CAD, HLD, tobacco abuse, bipolar disorder, COPD, DVT 2008 here today for cardiac follow up. He has been followed in the past by Dr. Gala Romney.  I saw him in our office April 2013. He presented to [facility] on 07/04/10 in transfer from [facility] with an inferior ST elevation myocardial infarction. He was taken to the cardiac catheterization lab by Norwood Levo. Cardiac catheterization demonstrated a totally occluded mid RCA. This was treated with a Vision bare metal stent. Residual disease included 10-20% distal left main stenosis, 40-50% proximal LAD and 20-30% mid LAD, 30% proximal RCA. Left ventriculogram demonstrated inferior hypokinesis with an EF of 50%. He did have hypotension and bradycardia post PCI. This required temporary pacemaker implantation and dopamine. He was eventually weaned off of both of these and maintained an adequate blood pressure and heart rate. He was started on lovastatin for dyslipidemia. He continued to smoke. He did not follow up with cardiac rehab.   He is here today for follow up. He has been feeling weak. He has no energy.  No recurrent chest pain or SOB. He continues to smoke. He continues to have some pain in his right leg, anterior aspect at rest.   His Effient was stopped in September 2012. He is depressed. He has been on Wellbutrin. He continues to smoke 1/2 ppd.   Primary Care Physician: [facility])  Last Lipid Profile: TC 167, TG 114, HDL 40, LDL 104;    Past Medical History  Diagnosis Date  . CAD (coronary artery disease)     a. INF STEMI 07/04/10:  tx with thrombectomy + Vision BMS to [facility];  cath 07/04/10: dLM 10-20%, pLAD 40-50%, mLAD 20-30%, pRCA 30%, mRCA occluded and tx with PCI, EF 50% with inf HK. A Multilink  . HLD (hyperlipidemia)   . COPD (chronic obstructive pulmonary disease)   . Tobacco abuse   . Bipolar disorder   . History of DVT (deep vein  thrombosis)     traumatic, s/p coumadin tx.  . Glucose intolerance (impaired glucose tolerance)     A1c 6.2 06/2010  . Depression     Past Surgical History  Procedure Date  . Electrocardiogram     Showed inferolateral ST-elevation and a code STEMI was activated. In the ER, he was treated with morphine, herarin, and 600 mg of Plavix. He was transferred emergently to [facility].     Current Outpatient Prescriptions  Medication Sig Dispense Refill  . ALPRAZolam (XANAX) 0.5 MG tablet Take 0.5 mg by mouth at bedtime as needed.        Marland Kitchen aspirin 81 MG tablet Take 81 mg by mouth daily.        Marland Kitchen lovastatin (MEVACOR) 40 MG tablet Take 40 mg by mouth at bedtime.        . metoprolol tartrate (LOPRESSOR) 25 MG tablet TAKE ONE-HALF TABLET BY MOUTH TWICE DAILY  30 tablet  3  . naproxen sodium (ANAPROX) 220 MG tablet Take 500 mg by mouth. As needed      . nicotine (NICODERM CQ - DOSED IN MG/24 HOURS) 21 mg/24hr patch Place 1 patch onto the skin daily.        . nitroGLYCERIN (NITROSTAT) 0.4 MG SL tablet Place 0.4 mg under the tongue every 5 (five) minutes as needed.          Allergies  Allergen Reactions  . Penicillins  History   Social History  . Marital Status: Divorced    Spouse Name: N/A    Number of Children: N/A  . Years of Education: N/A   Occupational History  . Not on file.   Social History Main Topics  . Smoking status: Current Everyday Smoker -- 2.0 packs/day    Types: Cigarettes  . Smokeless tobacco: Not on file  . Alcohol Use: Not on file     occasionally  . Drug Use: Not on file  . Sexually Active: Not on file   Other Topics Concern  . Not on file   Social History Narrative   Lives in [place]. He lives alone. He has kids but is not married.     Family History  Problem Relation Age of Onset  . Coronary artery disease Father     Review of Systems:  As stated in the HPI and otherwise negative.   BP 134/87  Pulse 94  Ht 5\' 11"  (1.803 m)  Wt 204 lb  (92.534 kg)  BMI 28.45 kg/m2  Physical Examination: General: Well developed, well nourished, NAD HEENT: OP clear, mucus membranes moist SKIN: warm, dry. No rashes. Neuro: No focal deficits Musculoskeletal: Muscle strength 5/5 all ext Psychiatric: Mood and affect normal Neck: No JVD, no carotid bruits, no thyromegaly, no lymphadenopathy. Lungs:Clear bilaterally, no wheezes, rhonci, crackles Cardiovascular: Regular rate and rhythm. No murmurs, gallops or rubs. Abdomen:Soft. Bowel sounds present. Non-tender.  Extremities: No lower extremity edema. Pulses are 2 + in the bilateral DP/PT. No groin hematoma or bruit.     Assessment and Plan:   1. CAD: Stable. No chest pain or SOB. Continue ASA, statin, beta blocker.   2. Fatigue: I do not think this is cardiac related. Will check TSH and CBC today. This could be related to depression. He is on Wellbutrin. He is not suicidal.   3. Right leg pain: Sporadic pains in right anterior leg since his cath in 2012. No edema so DVT is low on the differential. He had a history of DVT but his leg was swollen then. No hematoma or bruit at cath site. He has good pulses distally in the right leg and a good femoral pulse. Unlikely to be arterial related. I do not think a venous or arterial doppler is indicated. Likely neuropathic pain.

## 2011-10-02 ENCOUNTER — Telehealth: Payer: Self-pay | Admitting: *Deleted

## 2011-10-02 NOTE — Telephone Encounter (Signed)
Left message on pt's identified voicemail of normal lab results. Left message to call back if questions.

## 2011-12-17 ENCOUNTER — Other Ambulatory Visit: Payer: Self-pay | Admitting: *Deleted

## 2011-12-17 MED ORDER — METOPROLOL TARTRATE 25 MG PO TABS: ORAL_TABLET | ORAL | Status: DC

## 2012-01-07 ENCOUNTER — Encounter (HOSPITAL_COMMUNITY): Payer: Self-pay | Admitting: Emergency Medicine

## 2012-01-07 ENCOUNTER — Encounter: Payer: Self-pay | Admitting: *Deleted

## 2012-01-07 ENCOUNTER — Emergency Department (HOSPITAL_COMMUNITY)
Admission: EM | Admit: 2012-01-07 | Discharge: 2012-01-07 | Disposition: A | Discharge status: Home / Self Care | Payer: Self-pay | Attending: Emergency Medicine | Admitting: Emergency Medicine

## 2012-01-07 DIAGNOSIS — I2510 Atherosclerotic heart disease of native coronary artery without angina pectoris: Secondary | ICD-10-CM | POA: Insufficient documentation

## 2012-01-07 DIAGNOSIS — G8929 Other chronic pain: Secondary | ICD-10-CM | POA: Insufficient documentation

## 2012-01-07 DIAGNOSIS — E785 Hyperlipidemia, unspecified: Secondary | ICD-10-CM | POA: Insufficient documentation

## 2012-01-07 DIAGNOSIS — Z862 Personal history of diseases of the blood and blood-forming organs and certain disorders involving the immune mechanism: Secondary | ICD-10-CM | POA: Insufficient documentation

## 2012-01-07 DIAGNOSIS — Z79899 Other long term (current) drug therapy: Secondary | ICD-10-CM | POA: Insufficient documentation

## 2012-01-07 DIAGNOSIS — Z7982 Long term (current) use of aspirin: Secondary | ICD-10-CM | POA: Insufficient documentation

## 2012-01-07 DIAGNOSIS — R1030 Lower abdominal pain, unspecified: Secondary | ICD-10-CM

## 2012-01-07 DIAGNOSIS — Z8639 Personal history of other endocrine, nutritional and metabolic disease: Secondary | ICD-10-CM | POA: Insufficient documentation

## 2012-01-07 DIAGNOSIS — Z86718 Personal history of other venous thrombosis and embolism: Secondary | ICD-10-CM | POA: Insufficient documentation

## 2012-01-07 DIAGNOSIS — M79609 Pain in unspecified limb: Secondary | ICD-10-CM | POA: Insufficient documentation

## 2012-01-07 DIAGNOSIS — R109 Unspecified abdominal pain: Secondary | ICD-10-CM | POA: Insufficient documentation

## 2012-01-07 DIAGNOSIS — F17200 Nicotine dependence, unspecified, uncomplicated: Secondary | ICD-10-CM | POA: Insufficient documentation

## 2012-01-07 DIAGNOSIS — F319 Bipolar disorder, unspecified: Secondary | ICD-10-CM | POA: Insufficient documentation

## 2012-01-07 DIAGNOSIS — J449 Chronic obstructive pulmonary disease, unspecified: Secondary | ICD-10-CM | POA: Insufficient documentation

## 2012-01-07 DIAGNOSIS — J4489 Other specified chronic obstructive pulmonary disease: Secondary | ICD-10-CM | POA: Insufficient documentation

## 2012-01-07 MED ORDER — HYDROCODONE-ACETAMINOPHEN 5-325 MG PO TABS: 1.0000 | ORAL_TABLET | Freq: Four times a day (QID) | ORAL | Status: DC | PRN

## 2012-01-07 MED ORDER — DOCUSATE SODIUM 100 MG PO CAPS: 100.0000 mg | ORAL_CAPSULE | Freq: Two times a day (BID) | ORAL | Status: DC

## 2012-01-07 MED ORDER — HYDROCODONE-ACETAMINOPHEN 5-325 MG PO TABS: 1.0000 | ORAL_TABLET | ORAL | Status: DC | PRN

## 2012-01-07 NOTE — ED Notes (Signed)
Called patient no answer.

## 2012-01-07 NOTE — ED Notes (Addendum)
Pt here with c/o right groin pain the radiate down to leg since a year ago after stent placement about a year ago because MI. Pt reports that pain got progressively worse in the last 2 months. Also reports ringing in ears bilaterally.

## 2012-01-07 NOTE — ED Notes (Signed)
Pt was called with no response in main waiting ED area

## 2012-01-07 NOTE — ED Provider Notes (Signed)
History     CSN: [id number]  Arrival date & time 01/07/12  1518   None     Chief Complaint  Patient presents with  . Groin Pain  . Leg Pain     HPI chief complaint: Groin pain. Onset one year ago. Location right groin. Improved with rest and worse with ambulation. Quality dull. Severity mild. Timing intermittent. For associated signs and symptoms to review of systems. Regarding social history see nurse's notes. History of coronary artery disease. I have reviewed patient's past medical, past surgical, social service was medications and allergies.  Past Medical History  Diagnosis Date  . CAD (coronary artery disease)     a. INF STEMI 07/04/10:  tx with thrombectomy + Vision BMS to [facility];  cath 07/04/10: dLM 10-20%, pLAD 40-50%, mLAD 20-30%, pRCA 30%, mRCA occluded and tx with PCI, EF 50% with inf HK. A Multilink  . HLD (hyperlipidemia)   . COPD (chronic obstructive pulmonary disease)   . Tobacco abuse   . Bipolar disorder   . History of DVT (deep vein thrombosis)     traumatic, s/p coumadin tx.  . Glucose intolerance (impaired glucose tolerance)     A1c 6.2 06/2010  . Depression     Past Surgical History  Procedure Date  . Electrocardiogram     Showed inferolateral ST-elevation and a code STEMI was activated. In the ER, he was treated with morphine, herarin, and 600 mg of Plavix. He was transferred emergently to [facility].   . Stent placement     Family History  Problem Relation Age of Onset  . Coronary artery disease Father     History  Substance Use Topics  . Smoking status: Current Every Day Smoker -- 2.0 packs/day    Types: Cigarettes  . Smokeless tobacco: Current User  . Alcohol Use: No     Comment: occasionally      Review of Systems  Constitutional: Negative for fever and chills.  HENT: Negative for neck pain and neck stiffness.   Eyes: Negative for visual disturbance.  Respiratory: Negative for cough, chest tightness and shortness of breath.     Cardiovascular: Negative for chest pain, palpitations and leg swelling.  Gastrointestinal: Negative for nausea, vomiting, abdominal pain, diarrhea, constipation, blood in stool, abdominal distention and rectal pain.  Genitourinary: Negative for dysuria, urgency, frequency, hematuria, decreased urine volume, discharge, penile swelling, scrotal swelling, difficulty urinating, genital sores, penile pain and testicular pain.  Musculoskeletal: Negative for back pain and gait problem.       Right groin pain.  Skin: Negative for rash.  Neurological: Negative for dizziness, tremors, seizures, syncope, facial asymmetry, speech difficulty, weakness, light-headedness, numbness and headaches.  Hematological: Negative for adenopathy. Does not bruise/bleed easily.  Psychiatric/Behavioral: Negative for confusion.    Allergies  Penicillins  Home Medications   Current Outpatient Rx  Name  Route  Sig  Dispense  Refill  . ACETAMINOPHEN 325 MG PO TABS   Oral   Take 325 mg by mouth every 6 (six) hours as needed. For pain         . ASPIRIN EC 81 MG PO TBEC   Oral   Take 81 mg by mouth daily.         Marland Kitchen METOPROLOL TARTRATE 25 MG PO TABS   Oral   Take 12.5 mg by mouth 2 (two) times daily.         Marland Kitchen MIRTAZAPINE 30 MG PO TABS   Oral   Take 30  mg by mouth at bedtime.         Marland Kitchen NITROGLYCERIN 0.4 MG SL SUBL   Sublingual   Place 0.4 mg under the tongue every 5 (five) minutes as needed.           Marland Kitchen PRAVASTATIN SODIUM 40 MG PO TABS   Oral   Take 40 mg by mouth every morning.         . STUDY MEDICATION   Oral   Take 1 tablet by mouth every morning. Blind study drug; Duration for 3 years           BP 126/89  Pulse 95  Temp 99.4 F (37.4 C) (Oral)  Resp 20  SpO2 94%  Physical Exam  Constitutional: He is oriented to person, place, and time. He appears well-developed and well-nourished. No distress.  HENT:  Head: Normocephalic.  Eyes: Conjunctivae normal are normal.  Neck:  Normal range of motion. Neck supple.  Cardiovascular: Normal rate, regular rhythm, normal heart sounds and intact distal pulses.   No murmur heard. Pulmonary/Chest: Effort normal and breath sounds normal. No respiratory distress.  Abdominal: Soft. Bowel sounds are normal. He exhibits no distension, no abdominal bruit and no pulsatile midline mass. There is no hepatosplenomegaly. There is no tenderness. There is no rigidity, no rebound, no guarding, no CVA tenderness, no tenderness at McBurney's point and negative Murphy's sign. A hernia is present. Hernia confirmed positive in the left inguinal area. Hernia confirmed negative in the ventral area and confirmed negative in the right inguinal area.  Genitourinary: Penis normal. Right testis shows no mass, no swelling and no tenderness. Right testis is descended. Cremasteric reflex is not absent on the right side. Left testis shows mass. Left testis shows no swelling and no tenderness. Left testis is descended. Cremasteric reflex is not absent on the left side. Circumcised.  Musculoskeletal: Normal range of motion. He exhibits no edema and no tenderness.       Right hip: Normal.       Right knee: Normal.       Cervical back: Normal.       Thoracic back: Normal.       Lumbar back: Normal.       Negative straight leg raise and crossed straight leg raise.  Lymphadenopathy:       Right: No inguinal adenopathy present.       Left: No inguinal adenopathy present.  Neurological: He is alert and oriented to person, place, and time.  Skin: Skin is warm and dry. He is not diaphoretic.  Psychiatric: He has a normal mood and affect.    ED Course  Procedures (including critical care time)  Labs Reviewed - No data to display No results found.   1. Chronic groin pain       MDM  The patient is a very well-appearing 61 year old male who had a right femoral artery catheterization 1.5 years ago presents with one year of right groin pain which only  presents after he has been walking 5-10 minutes and is worse with ambulation. Resolved with rest. Seen his PCP but has not been worked up for this. Vital stable. Afebrile. No pain with range of motion of the right hip. Clinical picture doubtful for right hip pathology. A bedside ultrasound demonstrates right femoral artery is 1.5 cm equal in diameter to the opposite with no obvious intraluminal thrombus or dissection. Femoral vein in the region of the right groin is easily compressible. There is no bruits the right  groin. Clinical picture is doubtful for right femoral artery aneurysm, right femoral DVT. No inguinal hernia. Right lower extremities neurovascular intact, warm and well perfused. Normal pulses. GU exam largely unremarkable. I do not feel patient has a intra-abdominal cause of his right groin pain. Given the chronicity of the patient's pain, and is stable appearance in the emergency department I feel patient's symptoms can be further worked up on an outpatient basis. Start home with PCP followup        Consuello Masse, MD 01/08/12 [phone]

## 2012-01-08 NOTE — ED Provider Notes (Signed)
I saw and evaluated the patient, reviewed the resident's note and I agree with the findings and plan. Pt with groin pain with no herneas or signs of testicle involevment.  Possibly coming from the hip.  Gwyneth Sprout, MD 01/08/12 [phone]

## 2012-03-07 ENCOUNTER — Emergency Department (HOSPITAL_COMMUNITY): Payer: Self-pay

## 2012-03-07 ENCOUNTER — Emergency Department (HOSPITAL_COMMUNITY)
Admission: EM | Admit: 2012-03-07 | Discharge: 2012-03-07 | Disposition: A | Discharge status: Home / Self Care | Payer: Self-pay | Attending: Emergency Medicine | Admitting: Emergency Medicine

## 2012-03-07 DIAGNOSIS — Z9861 Coronary angioplasty status: Secondary | ICD-10-CM | POA: Insufficient documentation

## 2012-03-07 DIAGNOSIS — J4489 Other specified chronic obstructive pulmonary disease: Secondary | ICD-10-CM | POA: Insufficient documentation

## 2012-03-07 DIAGNOSIS — F319 Bipolar disorder, unspecified: Secondary | ICD-10-CM | POA: Insufficient documentation

## 2012-03-07 DIAGNOSIS — J449 Chronic obstructive pulmonary disease, unspecified: Secondary | ICD-10-CM | POA: Insufficient documentation

## 2012-03-07 DIAGNOSIS — IMO0002 Reserved for concepts with insufficient information to code with codable children: Secondary | ICD-10-CM | POA: Insufficient documentation

## 2012-03-07 DIAGNOSIS — I252 Old myocardial infarction: Secondary | ICD-10-CM | POA: Insufficient documentation

## 2012-03-07 DIAGNOSIS — Z791 Long term (current) use of non-steroidal anti-inflammatories (NSAID): Secondary | ICD-10-CM | POA: Insufficient documentation

## 2012-03-07 DIAGNOSIS — Z8639 Personal history of other endocrine, nutritional and metabolic disease: Secondary | ICD-10-CM | POA: Insufficient documentation

## 2012-03-07 DIAGNOSIS — Z8659 Personal history of other mental and behavioral disorders: Secondary | ICD-10-CM | POA: Insufficient documentation

## 2012-03-07 DIAGNOSIS — Y92009 Unspecified place in unspecified non-institutional (private) residence as the place of occurrence of the external cause: Secondary | ICD-10-CM | POA: Insufficient documentation

## 2012-03-07 DIAGNOSIS — R071 Chest pain on breathing: Secondary | ICD-10-CM | POA: Insufficient documentation

## 2012-03-07 DIAGNOSIS — W1789XA Other fall from one level to another, initial encounter: Secondary | ICD-10-CM

## 2012-03-07 DIAGNOSIS — Z86718 Personal history of other venous thrombosis and embolism: Secondary | ICD-10-CM | POA: Insufficient documentation

## 2012-03-07 DIAGNOSIS — S42101A Fracture of unspecified part of scapula, right shoulder, initial encounter for closed fracture: Secondary | ICD-10-CM

## 2012-03-07 DIAGNOSIS — Z79899 Other long term (current) drug therapy: Secondary | ICD-10-CM | POA: Insufficient documentation

## 2012-03-07 DIAGNOSIS — Y93H9 Activity, other involving exterior property and land maintenance, building and construction: Secondary | ICD-10-CM | POA: Insufficient documentation

## 2012-03-07 DIAGNOSIS — Z7982 Long term (current) use of aspirin: Secondary | ICD-10-CM | POA: Insufficient documentation

## 2012-03-07 DIAGNOSIS — W11XXXA Fall on and from ladder, initial encounter: Secondary | ICD-10-CM | POA: Insufficient documentation

## 2012-03-07 DIAGNOSIS — Z862 Personal history of diseases of the blood and blood-forming organs and certain disorders involving the immune mechanism: Secondary | ICD-10-CM | POA: Insufficient documentation

## 2012-03-07 DIAGNOSIS — Z23 Encounter for immunization: Secondary | ICD-10-CM | POA: Insufficient documentation

## 2012-03-07 DIAGNOSIS — I2510 Atherosclerotic heart disease of native coronary artery without angina pectoris: Secondary | ICD-10-CM | POA: Insufficient documentation

## 2012-03-07 DIAGNOSIS — F17200 Nicotine dependence, unspecified, uncomplicated: Secondary | ICD-10-CM | POA: Insufficient documentation

## 2012-03-07 DIAGNOSIS — S42109A Fracture of unspecified part of scapula, unspecified shoulder, initial encounter for closed fracture: Secondary | ICD-10-CM | POA: Insufficient documentation

## 2012-03-07 DIAGNOSIS — IMO0001 Reserved for inherently not codable concepts without codable children: Secondary | ICD-10-CM | POA: Insufficient documentation

## 2012-03-07 MED ORDER — IBUPROFEN 200 MG PO TABS
400.0000 mg | ORAL_TABLET | Freq: Once | ORAL | Status: AC
  Administered 2012-03-07: 400 mg via ORAL
  Filled 2012-03-07: qty 2

## 2012-03-07 MED ORDER — TETANUS-DIPHTH-ACELL PERTUSSIS 5-2.5-18.5 LF-MCG/0.5 IM SUSP
0.5000 mL | Freq: Once | INTRAMUSCULAR | Status: AC
  Administered 2012-03-07: 0.5 mL via INTRAMUSCULAR
  Filled 2012-03-07: qty 0.5

## 2012-03-07 MED ORDER — IOHEXOL 300 MG/ML  SOLN
80.0000 mL | Freq: Once | INTRAMUSCULAR | Status: AC | PRN
  Administered 2012-03-07: 80 mL via INTRAVENOUS

## 2012-03-07 MED ORDER — OXYCODONE-ACETAMINOPHEN 5-325 MG PO TABS: ORAL_TABLET | ORAL | Status: DC

## 2012-03-07 NOTE — ED Notes (Signed)
Patient drove here to the ED due to a fall from 59ft ladder on Wednesday 26th. Patient denies LOC. Sustained multiple dry abrasions & contusions noted to arms, legs, right hip, abdomen. Patient has a stiff C-Collar applied from triage. No neuro deficits.

## 2012-03-07 NOTE — ED Notes (Addendum)
Pt reports he fell 9 ft off ladder on wed night. Pt fell face forward. Arms in protective position on top of alluminum ladder. Bruising noted to chest, abdomen and arms. Laceration to right elbow and right shin. Unsure last tetanus shot. Pt has cardiac hx and stent placed 2 years ago. Pt reports right arm pain 6/10. Pt thinks collar bone might be hurt, reports there is a bump on right collar bone area.   Having lots of difficulty moving and ambulating. Generalized pain all over  c collar applied upon arrival to ED

## 2012-03-07 NOTE — ED Provider Notes (Signed)
Medical screening examination/treatment/procedure(s) were performed by non-physician practitioner and as supervising physician I was immediately available for consultation/collaboration.  Ethelda Chick, MD 03/07/12 2016

## 2012-03-07 NOTE — ED Notes (Signed)
Pt wanting to use restroom but does not want to use bedpan or urinal.  Wants to wait till doctors sees her and she can ambulate to restroom.

## 2012-03-07 NOTE — ED Notes (Signed)
Patient transported to X-ray 

## 2012-03-07 NOTE — ED Provider Notes (Signed)
History     CSN: [id number]  Arrival date & time 03/07/12  1523   First MD Initiated Contact with Patient 03/07/12 [phone]      Chief Complaint  Patient presents with  . fell 26ft off ladder     (Consider location/radiation/quality/duration/timing/severity/associated sxs/prior treatment) HPI  Anthony [name] is a 62 y.o. male complaining of pain status post fall off of 9 foot ladder 48 hours ago patient fell onto a wooden deck he denies any specific head trauma, loss of consciousness, headache or nausea and vomiting. Patient endorses a right shoulder pain with weakness and a right clavicular deformity, in addition to multiple abrasions, and a general soreness. Last tetanus shot is unknown. Patient denies chest pain, shortness of breath, abdominal pain, change in bowel or bladder habits including hematuria, numbness.   Past Medical History  Diagnosis Date  . CAD (coronary artery disease)     a. INF STEMI 07/04/10:  tx with thrombectomy + Vision BMS to [facility];  cath 07/04/10: dLM 10-20%, pLAD 40-50%, mLAD 20-30%, pRCA 30%, mRCA occluded and tx with PCI, EF 50% with inf HK. A Multilink  . HLD (hyperlipidemia)   . COPD (chronic obstructive pulmonary disease)   . Tobacco abuse   . Bipolar disorder   . History of DVT (deep vein thrombosis)     traumatic, s/p coumadin tx.  . Glucose intolerance (impaired glucose tolerance)     A1c 6.2 06/2010  . Depression     Past Surgical History  Procedure Laterality Date  . Electrocardiogram      Showed inferolateral ST-elevation and a code STEMI was activated. In the ER, he was treated with morphine, herarin, and 600 mg of Plavix. He was transferred emergently to [facility].   . Stent placement      Family History  Problem Relation Age of Onset  . Coronary artery disease Father     History  Substance Use Topics  . Smoking status: Current Every Day Smoker -- 2.00 packs/day    Types: Cigarettes  . Smokeless tobacco: Current User  .  Alcohol Use: No     Comment: occasionally      Review of Systems  Constitutional: Negative for fever.  Respiratory: Negative for shortness of breath.   Cardiovascular: Negative for chest pain.  Gastrointestinal: Negative for nausea, vomiting, abdominal pain and diarrhea.  All other systems reviewed and are negative.    Allergies  Penicillins  Home Medications   Current Outpatient Rx  Name  Route  Sig  Dispense  Refill  . aspirin EC 81 MG tablet   Oral   Take 81 mg by mouth every morning.          Marland Kitchen HYDROcodone-acetaminophen (NORCO/VICODIN) 5-325 MG per tablet   Oral   Take 1 tablet by mouth every 4 (four) hours as needed (PAIN).         Marland Kitchen lovastatin (MEVACOR) 40 MG tablet   Oral   Take 40 mg by mouth at bedtime.         . metoprolol tartrate (LOPRESSOR) 25 MG tablet   Oral   Take 12.5 mg by mouth 2 (two) times daily.         . naproxen sodium (ANAPROX) 220 MG tablet   Oral   Take 220 mg by mouth 2 (two) times daily as needed (pain).           BP 139/99  Pulse 87  Temp(Src) 98 F (36.7 C) (Oral)  Resp  20  SpO2 99%  Physical Exam  Nursing note and vitals reviewed. Constitutional: He is oriented to person, place, and time. He appears well-developed and well-nourished. No distress.  HENT:  Head: Normocephalic and atraumatic.  Right Ear: External ear normal.  Mouth/Throat: Oropharynx is clear and moist.  No signs of head trauma  Eyes: Conjunctivae and EOM are normal. Pupils are equal, round, and reactive to light.  Neck: Normal range of motion. Neck supple.  No midline tenderness to palpation or step-offs appreciated. Patient has full range of motion without pain.  Cardiovascular: Normal rate, regular rhythm, normal heart sounds and intact distal pulses.   Pulmonary/Chest: Effort normal and breath sounds normal. No stridor. No respiratory distress. He has no wheezes. He has no rales. He exhibits no tenderness.    Tenderness to palpation or  crepitance  Abdominal: Soft. Bowel sounds are normal. He exhibits no distension and no mass. There is no tenderness. There is no rebound and no guarding.    Musculoskeletal: Normal range of motion. He exhibits no edema.  Right shoulder range of motion is significantly reduced. Patient cannot abduction over 90. Radial pulses 2+ bilaterally distal sensation is grossly intact, normal range of motion to elbow with no tenderness to palpation. Patient is diffusely tender to anterior and posterior right shoulder with no ecchymoses. No clavicular deformity appreciated.   Patient and ambulates without difficulty there is full range of motion in bilateral hips flexion and extension and both bilateral knees have full range of motion without tenderness.   Neurological: He is alert and oriented to person, place, and time.  Skin:  Multiple partial thickness abrasions to all 4 extremities, no erythema, induration, tenderness, warmth or discharge.  Psychiatric: He has a normal mood and affect.    ED Course  Procedures (including critical care time)  Labs Reviewed - No data to display Dg Chest 2 View  03/07/2012  *RADIOLOGY REPORT*  Clinical Data: Cough, shortness of breath  CHEST - 2 VIEW  Comparison: 07/04/2010  Findings: Low lung volumes with left basilar atelectasis / scarring.  No pleural effusion or pneumothorax.  The heart is normal in size.  Visualized osseous structures are within normal limits.  IMPRESSION: No evidence of acute cardiopulmonary disease.   Original Report Authenticated By: Charline Bills, M.D.    Dg Cervical Spine Complete  03/07/2012  *RADIOLOGY REPORT*  Clinical Data: Fall, neck pain.  CERVICAL SPINE - COMPLETE 4+ VIEW  Comparison: None  Findings: Mild degenerative disc and foot disease throughout the cervical spine.  Alignment is normal.  No fracture.  Prevertebral soft tissues are normal.  IMPRESSION: No acute bony abnormality.   Original Report Authenticated By: Charlett Nose,  M.D.    Dg Lumbar Spine Complete  03/07/2012  *RADIOLOGY REPORT*  Clinical Data: Fall, low back pain.  LUMBAR SPINE - COMPLETE 4+ VIEW  Comparison: None  Findings: Diffuse degenerative facet disease and mild degenerative disc disease throughout the lumbar spine.  Normal alignment.  No fracture.  SI joints are symmetric and unremarkable.  IMPRESSION: Degenerative changes.  No acute findings.   Original Report Authenticated By: Charlett Nose, M.D.    Dg Shoulder Right  03/07/2012  *RADIOLOGY REPORT*  Clinical Data: Fall, right shoulder pain.  RIGHT SHOULDER - 2+ VIEW  Comparison: None  Findings: Advanced degenerative changes in the right AC joint. Glenohumeral joint is unremarkable.  There is angulation near the medial tip of the scapula concerning for scapular fracture.  No additional fracture visualized.  IMPRESSION: Medial  right scapular fracture.   Original Report Authenticated By: Charlett Nose, M.D.    Ct Chest W Contrast  03/07/2012  *RADIOLOGY REPORT*  Clinical Data: Fall, known right scapular fracture  CT CHEST WITH CONTRAST  Technique:  Multidetector CT imaging of the chest was performed following the standard protocol during bolus administration of intravenous contrast.  Contrast: 80mL OMNIPAQUE IOHEXOL 300 MG/ML  SOLN  Comparison: Chest and right shoulder radiographs dated 03/07/2012  Findings: No evidence of mediastinal hematoma.  Mild dependent atelectasis in the bilateral lower lobes.  Mild to moderate paraseptal emphysematous changes. No pleural effusion or pneumothorax.  Visualized thyroid is unremarkable.  The heart is normal in size.  No pericardial effusion.  Coronary atherosclerosis.  After calcifications of the aortic arch.  No suspicious mediastinal, hilar, or axillary lymphadenopathy.  Visualized upper abdomen is notable for hepatic steatosis.  Mildly comminuted fracture involving the superomedial aspect of right scapula (coronal image 57).  No additional fracture is seen.  Mild  degenerative changes of the lower thoracic spine.  IMPRESSION: Mildly comminuted fracture involving the superomedial aspect of right scapula.  Otherwise, no evidence of traumatic injury to the chest.   Original Report Authenticated By: Charline Bills, M.D.      1. Scapular fracture, right, closed, initial encounter   2. Injury resulting from fall from height       MDM   Patient had a 9 foot full from a ladder will try to clean out his gutters several days ago. He is presenting with right shoulder pain in reduced range of motion and weakness. In addition to several abrasions. Imaging ordered. Patient refuses pain control this time. C-spine cleared by nexus criteria. I think it still warrants imaging based on the mechanism of injury.   Shoulder x-ray shows scapular fracture. Discussed with attending who recommends Chest CT.  Chest CT shows no further abnormality except for the previously noted scapular fracture. I have placed him in a sling and advised him to follow with the orthopedist. Return precautions given.   Filed Vitals:   03/07/12 1525 03/07/12 1605 03/07/12 1748 03/07/12 1945  BP: 139/99 135/90 120/74 131/90  Pulse: 87 78 67 67  Temp: 98 F (36.7 C)  97.9 F (36.6 C) 98.5 F (36.9 C)  TempSrc: Oral  Oral Oral  Resp: 20 16 16 14   SpO2: 99%   93%     Pt verbalized understanding and agrees with care plan. Outpatient follow-up and return precautions given.    New Prescriptions   OXYCODONE-ACETAMINOPHEN (PERCOCET/ROXICET) 5-325 MG PER TABLET    1 to 2 tabs PO q6hrs  PRN for pain              Wynetta Emery, PA-C 03/07/12 2015

## 2012-03-10 ENCOUNTER — Telehealth: Payer: Self-pay | Admitting: Orthopedic Surgery

## 2012-03-10 NOTE — Telephone Encounter (Signed)
Patient called to request appointment following visit at [facility] Friday, 03/07/12 for problem: scapula fracture.  States he had been referred to someone in [place], but said instructions were vague, and that he has not heard from anyone in [place] yet.  States able to bring the minimum self-pay payment amount ("actually can bring a little more").  Please review and advise if okay to schedule an appointment.  His ph# [phone]

## 2012-04-21 ENCOUNTER — Other Ambulatory Visit (HOSPITAL_COMMUNITY): Payer: Self-pay | Admitting: Family Medicine

## 2012-04-21 DIAGNOSIS — M25569 Pain in unspecified knee: Secondary | ICD-10-CM

## 2012-04-22 ENCOUNTER — Encounter (HOSPITAL_COMMUNITY): Payer: Self-pay | Admitting: Emergency Medicine

## 2012-04-22 ENCOUNTER — Emergency Department (HOSPITAL_COMMUNITY)
Admission: EM | Admit: 2012-04-22 | Discharge: 2012-04-22 | Disposition: A | Discharge status: Home / Self Care | Payer: Self-pay | Attending: Emergency Medicine | Admitting: Emergency Medicine

## 2012-04-22 ENCOUNTER — Ambulatory Visit (HOSPITAL_COMMUNITY)
Admission: RE | Admit: 2012-04-22 | Discharge: 2012-04-22 | Disposition: A | Discharge status: Home / Self Care | Payer: Self-pay | Source: Ambulatory Visit | Attending: Family Medicine | Admitting: Family Medicine

## 2012-04-22 DIAGNOSIS — M79609 Pain in unspecified limb: Secondary | ICD-10-CM | POA: Insufficient documentation

## 2012-04-22 DIAGNOSIS — Z7982 Long term (current) use of aspirin: Secondary | ICD-10-CM | POA: Insufficient documentation

## 2012-04-22 DIAGNOSIS — M25569 Pain in unspecified knee: Secondary | ICD-10-CM

## 2012-04-22 DIAGNOSIS — I82409 Acute embolism and thrombosis of unspecified deep veins of unspecified lower extremity: Secondary | ICD-10-CM | POA: Insufficient documentation

## 2012-04-22 DIAGNOSIS — I82401 Acute embolism and thrombosis of unspecified deep veins of right lower extremity: Secondary | ICD-10-CM

## 2012-04-22 DIAGNOSIS — Z9861 Coronary angioplasty status: Secondary | ICD-10-CM | POA: Insufficient documentation

## 2012-04-22 DIAGNOSIS — E785 Hyperlipidemia, unspecified: Secondary | ICD-10-CM | POA: Insufficient documentation

## 2012-04-22 DIAGNOSIS — J4489 Other specified chronic obstructive pulmonary disease: Secondary | ICD-10-CM | POA: Insufficient documentation

## 2012-04-22 DIAGNOSIS — F17200 Nicotine dependence, unspecified, uncomplicated: Secondary | ICD-10-CM | POA: Insufficient documentation

## 2012-04-22 DIAGNOSIS — M7989 Other specified soft tissue disorders: Secondary | ICD-10-CM

## 2012-04-22 DIAGNOSIS — I2510 Atherosclerotic heart disease of native coronary artery without angina pectoris: Secondary | ICD-10-CM | POA: Insufficient documentation

## 2012-04-22 DIAGNOSIS — Z8659 Personal history of other mental and behavioral disorders: Secondary | ICD-10-CM | POA: Insufficient documentation

## 2012-04-22 DIAGNOSIS — Z79899 Other long term (current) drug therapy: Secondary | ICD-10-CM | POA: Insufficient documentation

## 2012-04-22 DIAGNOSIS — J449 Chronic obstructive pulmonary disease, unspecified: Secondary | ICD-10-CM | POA: Insufficient documentation

## 2012-04-22 DIAGNOSIS — Z86718 Personal history of other venous thrombosis and embolism: Secondary | ICD-10-CM | POA: Insufficient documentation

## 2012-04-22 LAB — CBC
MCH: 32.2 pg (ref 26.0–34.0)
MCHC: 36.8 g/dL — ABNORMAL HIGH (ref 30.0–36.0)
MCV: 87.4 fL (ref 78.0–100.0)
Platelets: 150 10*3/uL (ref 150–400)
RDW: 12.4 % (ref 11.5–15.5)

## 2012-04-22 LAB — BASIC METABOLIC PANEL
BUN: 13 mg/dL (ref 6–23)
CO2: 26 mEq/L (ref 19–32)
Calcium: 9.3 mg/dL (ref 8.4–10.5)
Creatinine, Ser: 0.93 mg/dL (ref 0.50–1.35)
Glucose, Bld: 111 mg/dL — ABNORMAL HIGH (ref 70–99)

## 2012-04-22 MED ORDER — RIVAROXABAN 15 MG PO TABS: 15.0000 mg | ORAL_TABLET | Freq: Every day | ORAL | Status: DC

## 2012-04-22 MED ORDER — OXYCODONE-ACETAMINOPHEN 5-325 MG PO TABS: 1.0000 | ORAL_TABLET | Freq: Four times a day (QID) | ORAL | Status: DC | PRN

## 2012-04-22 MED ORDER — RIVAROXABAN 15 MG PO TABS
15.0000 mg | ORAL_TABLET | Freq: Once | ORAL | Status: AC
  Administered 2012-04-22: 15 mg via ORAL
  Filled 2012-04-22: qty 1

## 2012-04-22 NOTE — ED Notes (Signed)
Lab at beside.

## 2012-04-22 NOTE — ED Notes (Signed)
Rt leg feels hot x 4 days  [facility] [place] [facility] had [place] of leg and he is positive for dvt here for further tx

## 2012-04-22 NOTE — Progress Notes (Signed)
   CARE MANAGEMENT ED NOTE 04/22/2012  Patient:  Anthony [name], Anthony [name]   Account Number:  [id number]  Date Initiated:  04/22/2012  Documentation initiated by:  Fransico Michael  Subjective/Objective Assessment:   presented to ED with c/o pain and swelling to right leg     Subjective/Objective Assessment Detail:     Action/Plan:   needs assistance with xarelto and pcp   Action/Plan Detail:   Anticipated DC Date:  04/22/2012     Status Recommendation to Physician:   Result of Recommendation:      DC Planning Services  CM consult  PCP issues    Choice offered to / List presented to:            Status of service:  Completed, signed off  ED Comments:   ED Comments Detail:  04/22/12-1515-J.Denali Sharma,RN,BSN [phone]      Patient assistance application faxed to [facility] and [place] along with copy of prescription  per instructions on application. All paperwork returned to patient along with fax confirmation. No further needs identified. Patient to be discharged back home today.  04/22/12-1501-J.Zyrell Carmean,RN,BSN [phone]      Called Adult care clinic at [phone] and made follow up appointment with them for next Tuesday, April 22 at 1730. Information entered on follow up section of discharge and shared with [facility] and Dr. Lorenso Courier.  04/22/12-1452-J.Hadden Steig,RN,BSN [phone]      Application for assistance reveiwed with Dr. Lorenso Courier. Signature obtained from Dr and patient. Prescription for initial 20 days of therapy written to be faxed with application.  04/22/12-1436-J.Vina Byrd,RN,BSN [phone]      Xarelto 10 day free card and application for paitent assistance given to and reviewed with patient. Emphasized importance of follow up appointment with patient due to limited supply of free medication. Voiced understanding. Denied having any monthly income or tax returns to send in with application.  04/22/12-1407-J.Everardo Voris,RN,BSN [phone]     Received call from Dr. Lorenso Courier that we "are going to proceed  with Xarelto initiation."  CM attempted to call Adult care clinic to make follow up appointment.  04/22/12- 1333-J.Kamonte Mcmichen,RN,BSN [phone]     In to speak with patient. Denies having insurance or income. Denies also to have PCP. Reports, "I just go to the health department in [place] if I need anything. Or here."  04/22/12-1303-J.Evah Rashid,RN,BSN [phone]     Received referral from Dr. Lorenso Courier regarding initiation of xarelto for recurrent DVT. Patient is self-pay. Felicia with [facility] consulted by [facility]. Dr. Lorenso Courier waiting on labs for final determination

## 2012-04-22 NOTE — ED Notes (Signed)
Pt c/o right leg pain and swelling since Friday, then on Saturday he noticed it was feeling warm, yesterday he went to health department to be evaluated and scheduled him an appointment for a doppler, and was told he had an old DVT, but his PCP told him to go to [facility] and get admitted. Pt reports he had a DVT about 4 years ago in right leg and was put on coumadin but was taken off of it a few years ago. Pt reports he has felt generalized weakness over the past year or so. Pt reports he is an everyday smoker. Pt in nad, ambulatory to room, skin warm and dry, resp e/u.

## 2012-04-22 NOTE — Discharge Instructions (Signed)
Deep Vein Thrombosis  A deep vein thrombosis (DVT) is a blood clot that develops in a deep vein. A DVT is a clot in the deep, larger veins of the leg, arm, or pelvis. These are more dangerous than clots that might form in veins near the surface of the body. A DVT can lead to complications if the clot breaks off and travels in the bloodstream to the lungs.   A DVT can damage the valves in your leg veins, so that instead of flowing upwards, the blood pools in the lower leg. This is called post-thrombotic syndrome, and can result in pain, swelling, discoloration, and sores on the leg.  Once identified, a DVT can be treated. It can also be prevented in some circumstances. Once you have had a DVT, you may be at increased risk for a DVT in the future.  CAUSES  Blood clots form in a vein for different reasons. Usually several things contribute to blood clots. Contributing factors include:   The flow of blood slows down.   The inside of the vein is damaged in some way.   The person has a condition that makes blood clot more easily.  Some people are more likely than others to develop blood clots. That is because they have more factors that make clots likely. These are called risk factors. Risk factors include:    Older age, especially over 75 years old.   Having a history of blood clots. This means you have had one before. Or, it means that someone else in your family has had blood clots. You may have a genetic tendency to form clots.   Having major or lengthy surgery. This is especially true for surgery on the hip, knee, or belly (abdomen). Hip surgery is particularly high risk.   Breaking a hip or leg.   Sitting or lying still for a long time. This includes long distance travel, paralysis, or recovery from an illness or surgery.   Cancer, or cancer treatment.   Having a long, thin tube (catheter) placed inside a vein during a medical procedure.   Being overweight (obese).   Pregnancy and childbirth. Hormone  changes make the blood clot more easily during pregnancy. The fetus puts pressure on the veins of the pelvis. There is also risk of injury to veins during delivery or a caesarean. The risk is at its highest just after childbirth.   Medicines with the male hormone estrogen. This includes birth control pills and hormone replacement therapy.   Smoking.   Other circulation or heart problems.  SYMPTOMS  When a clot forms, it can either partially or totally block the blood flow in that vein. Symptoms of a DVT can include:   Swelling of the leg or arm, especially if one side is much worse.   Warmth and redness of the leg or arm, especially if one side is much worse.   Pain in an arm or leg. If the clot is in the leg, symptoms may be more noticeable or worse when standing or walking.  The symptoms of a DVT that has traveled to the lungs (pulmonary embolism, PE) usually start suddenly, and include:   Shortness of breath.   Coughing.   Coughing up blood or blood-tinged phlegm.   Chest pain. The chest pain is often worse with deep breaths.   Rapid heartbeat.  Anyone with these symptoms should get emergency medical treatment right away. Call your local emergency services 911 in [place] if you have these symptoms.    the clotting properties of the blood.  Ultrasonography to see if you have clots in your legs or lungs.  X-rays to show the flow of blood when dye is injected into the veins (venography).  Studies of your lungs, if you have any chest symptoms. PREVENTION  Exercise the legs regularly. Take a brisk 30 minute walk every day.  Maintain a weight that is appropriate for your height.  Avoid sitting or lying in bed for long periods of time without moving your legs.  Women, particularly those over the age of 5,  should consider the risks and benefits of taking estrogen medicines, including birth control pills.  Do not smoke, especially if you take estrogen medicines.  Long distance travel can increase your risk of DVT. You should exercise your legs by walking or pumping the muscles every hour.  In-hospital prevention:  Many of the risk factors above relate to situations that exist with hospitalization, either for illness, injury, or elective surgery.  Your caregiver will assess you for the need for venous thromboembolism prophylaxis when you are admitted to the hospital. If you are having surgery, your surgeon will assess you the day of or day after surgery.  Prevention may include medical and nonmedical measures. TREATMENT Treatment for DVT helps prevent death and disability. The most common treatment for DVT is blood thinning (anticoagulant) medicine, which reduces the blood's tendency to clot. Anticoagulants can stop new blood clots from forming and old ones from growing. They cannot dissolve existing clots. Your body does this by itself over time. Anticoagulants can be given by mouth, by intravenous (IV) access, or by injection. Your caregiver will determine the best program for you.  Heparin or related medicines (low molecular weight heparin) are usually the first treatment for a blood clot. They act quickly. However, they cannot be taken orally.  Heparin can cause a fall in a component of blood that stops bleeding and forms blood clots (platelets). You will be monitored with blood tests to be sure this does not occur.  Warfarin is an anticoagulant that can be swallowed (taken orally). It takes a few days to start working, so usually heparin or related medicines are used in combination. Once warfarin is working, heparin is usually stopped.  Less commonly, clot dissolving drugs (thrombolytics) are used to dissolve a DVT. They carry a high risk of bleeding, so they are used mainly in severe cases,  where a life or limb is threatened.  Very rarely, a blood clot in the leg needs to be removed surgically.  If you are unable to take anticoagulants, your caregiver may arrange for you to have a filter placed in a main vein in your belly (abdomen). This filter prevents clots from traveling to your lungs. HOME CARE INSTRUCTIONS  Take all medicines prescribed by your caregiver. Follow the directions carefully.  Warfarin. Most people will continue taking warfarin after hospital discharge. Your caregiver will advise you on the length of treatment (usually 3 6 months, sometimes lifelong).  Too much and too little warfarin are both dangerous. Too much warfarin increases the risk of bleeding. Too little warfarin continues to allow the risk for blood clots. While taking warfarin, you will need to have regular blood tests to measure your blood clotting time. These blood tests usually include both the prothrombin time (PT) and international normalized ratio (INR) tests. The PT and INR results allow your caregiver to adjust your dose of warfarin. The dose can change for many reasons. It is critically important that  blood clots. While taking warfarin, you will need to have regular blood tests to measure your blood clotting time. These blood tests usually include both the prothrombin time (PT) and international normalized ratio (INR) tests. The PT and INR results allow your caregiver to adjust your dose of warfarin. The dose can change for many reasons. It is critically important that you take warfarin exactly as prescribed, and that you have your PT and INR levels drawn exactly as directed.   Many foods, especially foods high in vitamin K can interfere with warfarin and affect the PT and INR results. Foods high in vitamin K include spinach, kale, broccoli, cabbage, collard and turnip greens, brussels sprouts, peas, cauliflower, seaweed, and parsley as well as beef and pork liver, green tea, and soybean oil. You should eat a consistent amount of foods high in vitamin K. Avoid major changes in your diet, or notify your caregiver before changing your diet. Arrange a visit with a dietitian to answer your questions.   Many medicines can interfere with warfarin and affect the PT and INR results. You must tell your caregiver about any and all medicines you take, this includes all vitamins  and supplements. Be especially cautious with aspirin and anti-inflammatory medicines. Ask your caregiver before taking these. Do not take or discontinue any prescribed or over-the-counter medicine except on the advice of your caregiver or pharmacist.   Warfarin can have side effects, primarily excessive bruising or bleeding. You will need to hold pressure over cuts for longer than usual. Your caregiver or pharmacist will discuss other potential side effects.   Alcohol can change the body's ability to handle warfarin. It is best to avoid alcoholic drinks or consume only very small amounts while taking warfarin. Notify your caregiver if you change your alcohol intake.   Notify your dentist or other caregivers before procedures.   Activity. Ask your caregiver how soon you can go back to normal activities. It is important to stay active to prevent blood clots. If you are on anticoagulant medicine, avoid contact sports.   Exercise. It is very important to exercise. This is especially important while traveling, sitting or standing for long periods of time. Exercise your legs by walking or by pumping the muscles frequently. Take frequent walks.   Compression stockings. These are tight elastic stockings that apply pressure to the lower legs. This pressure can help keep the blood in the legs from clotting. You may need to wear compressions stockings at home to help prevent a DVT.   Smoking. If you smoke, quit. Ask your caregiver for help with quitting smoking.   Learn as much as you can about DVT. Knowing more about the condition should help you keep it from coming back.   Wear a medical alert bracelet or carry a medical alert card.  SEEK MEDICAL CARE IF:   You notice a rapid heartbeat.   You feel weaker or more tired than usual.   You feel faint.   You notice increased bruising.   You feel your symptoms are not getting better in the time expected.   You believe you are having side effects of medicine.  SEEK  IMMEDIATE MEDICAL CARE IF:   You have chest pain.   You have trouble breathing.   You have new or increased swelling or pain in one leg.   You cough up blood.   You notice blood in vomit, in a bowel movement, or in urine.  MAKE SURE YOU:   Understand these instructions.  

## 2012-04-22 NOTE — ED Provider Notes (Signed)
History     CSN: [id number]  Arrival date & time 04/22/12  1042   First MD Initiated Contact with Patient 04/22/12 1123      Chief Complaint  Patient presents with  . DVT    (Consider location/radiation/quality/duration/timing/severity/associated sxs/prior treatment) HPI Comments: Anthony [name] presents for evaluation of right lower leg pain.  He reports a dull soreness and throbbing of the leg and calf over the last 4 days.  It has become more noticeable and is within the last 48 hours swelling has developed.  He was seen yesterday at the [facility] and referred here for a DVT stud this morning.  He was told that there were clots in the right leg at the vascular clinic.  He was referred directly to the ER.  He denies chest pain, palpitations, new back pain, fainting, and shortness of breath.  Patient is a 62 y.o. male presenting with leg pain. The history is provided by the patient. No language interpreter was used.  Leg Pain Location:  Leg Time since incident:  4 days Injury: no   Leg location:  R leg and R lower leg Pain details:    Quality:  Dull, pressure, throbbing and aching   Radiates to:  Does not radiate   Onset quality:  Gradual   Timing:  Constant   Progression:  Unchanged Chronicity:  New Dislocation: no   Foreign body present:  No foreign bodies Prior injury to area:  Yes (hx DVT) Relieved by:  Nothing Worsened by:  Nothing tried Ineffective treatments:  None tried Associated symptoms: swelling   Associated symptoms: no decreased ROM, no fatigue, no fever, no muscle weakness, no numbness and no stiffness   Risk factors: no frequent fractures, no known bone disorder and no recent illness   Risk factors comment:  Previous DVT   Past Medical History  Diagnosis Date  . CAD (coronary artery disease)     a. INF STEMI 07/04/10:  tx with thrombectomy + [facility] BMS to [facility];  cath 07/04/10: dLM 10-20%, pLAD 40-50%, mLAD 20-30%, pRCA 30%, mRCA  occluded and tx with PCI, EF 50% with inf HK. A Multilink  . HLD (hyperlipidemia)   . COPD (chronic obstructive pulmonary disease)   . Tobacco abuse   . Bipolar disorder   . History of DVT (deep vein thrombosis)     traumatic, s/p coumadin tx.  . Glucose intolerance (impaired glucose tolerance)     A1c 6.2 06/2010  . Depression     Past Surgical History  Procedure Laterality Date  . Electrocardiogram      Showed inferolateral ST-elevation and a code STEMI was activated. In the ER, he was treated with morphine, herarin, and 600 mg of Plavix. He was transferred emergently to [facility].   . Stent placement      Family History  Problem Relation Age of Onset  . Coronary artery disease Father     History  Substance Use Topics  . Smoking status: Current Every Day Smoker -- 2.00 packs/day    Types: Cigarettes  . Smokeless tobacco: Current User  . Alcohol Use: No     Comment: occasionally      Review of Systems  Constitutional: Negative for fever and fatigue.  Cardiovascular: Positive for leg swelling.  Musculoskeletal: Positive for arthralgias. Negative for stiffness.  All other systems reviewed and are negative.    Allergies  Penicillins  Home Medications   Current Outpatient Rx  Name  Route  Sig  Dispense  Refill  . aspirin EC 81 MG tablet   Oral   Take 81 mg by mouth every morning.          Marland Kitchen HYDROcodone-acetaminophen (NORCO/VICODIN) 5-325 MG per tablet   Oral   Take 1 tablet by mouth every 4 (four) hours as needed (PAIN).         Marland Kitchen lovastatin (MEVACOR) 40 MG tablet   Oral   Take 40 mg by mouth at bedtime.         . metoprolol tartrate (LOPRESSOR) 25 MG tablet   Oral   Take 12.5 mg by mouth 2 (two) times daily.         . naproxen sodium (ANAPROX) 220 MG tablet   Oral   Take 220 mg by mouth 2 (two) times daily as needed (pain).         Marland Kitchen oxyCODONE-acetaminophen (PERCOCET/ROXICET) 5-325 MG per tablet      1 to 2 tabs PO q6hrs  PRN  for pain   15 tablet   0     BP 109/79  Pulse 86  Temp(Src) 98.3 F (36.8 C)  Resp 16  SpO2 96%  Physical Exam  Nursing note and vitals reviewed. Constitutional: He is oriented to person, place, and time. He appears well-developed and well-nourished. No distress.  HENT:  Head: Normocephalic and atraumatic.  Right Ear: External ear normal.  Left Ear: External ear normal.  Nose: Nose normal.  Mouth/Throat: Oropharynx is clear and moist. No oropharyngeal exudate.  Eyes: Conjunctivae are normal. Pupils are equal, round, and reactive to light. Right eye exhibits no discharge. Left eye exhibits no discharge. No scleral icterus.  Neck: Normal range of motion. Neck supple. No JVD present. No tracheal deviation present.  Pulmonary/Chest: No stridor.  Abdominal: Soft. Bowel sounds are normal. He exhibits no distension and no mass. There is no tenderness. There is no rebound and no guarding.  Musculoskeletal: Normal range of motion. He exhibits tenderness (right calf.  also note increased circumference of right calf as compared to left). He exhibits no edema.  Lymphadenopathy:    He has no cervical adenopathy.  Neurological: He is alert and oriented to person, place, and time.  Skin: Skin is warm and dry. No rash noted. He is not diaphoretic. No erythema. No pallor.  Psychiatric: He has a normal mood and affect. His behavior is normal.    ED Course  Procedures (including critical care time)  Labs Reviewed  CBC  APTT  PROTIME-INR  BASIC METABOLIC PANEL   No results found.   No diagnosis found.    MDM  Pt presents for evaluation of atraumatic right leg pain and swelling.  An ultrasound obtained this morning demonstrates DVT of the right leg.  He has no clinical evidence of limb ischemia.  Will obtain basic labs and coags.  Will also consult the hospitalist service for treatment recommendations.  He would like to avoid taking coumadin but he has no insurance benefits and his  ability to take a newer anticoagulant may be limited by cost.  1515.  Pt stable, NAD.  Case management was able to arrange follow-up for Mr. Bahl.  He has also been supplied with 21 days of xarelto and will apply for assistance with the drug manufacturer to continue the medication thereafter.  Pt has no clinical evidence of PE.  Plan discharge home.      Tobin Chad, MD 04/22/12 [phone]

## 2012-04-22 NOTE — Progress Notes (Signed)
*  PRELIMINARY RESULTS* Vascular Ultrasound Lower extremity venous duplex has been completed.  Preliminary findings: Right= DVT involving the femoral, popliteal, and posterior tibial veins. Mostly appears chronic, but areas of distal femoral and popliteal appears more acute. Left= no evidence of DVT.  Called report to [employer], PA at [facility]. She instructed patient to go to ED for treatment.   Farrel Demark, RDMS, RVT  04/22/2012, 10:46 AM

## 2012-04-29 ENCOUNTER — Observation Stay (HOSPITAL_COMMUNITY)
Admission: AD | Admit: 2012-04-29 | Discharge: 2012-04-29 | Discharge status: Against Medical Advice | Payer: Self-pay | Source: Ambulatory Visit | Attending: Family Medicine | Admitting: Family Medicine

## 2012-04-29 ENCOUNTER — Encounter (HOSPITAL_COMMUNITY): Payer: Self-pay | Admitting: *Deleted

## 2012-04-29 ENCOUNTER — Emergency Department (HOSPITAL_COMMUNITY)
Admission: EM | Admit: 2012-04-29 | Discharge: 2012-04-29 | Disposition: A | Discharge status: Still In Hospital | Payer: Self-pay | Source: Home / Self Care

## 2012-04-29 DIAGNOSIS — E785 Hyperlipidemia, unspecified: Secondary | ICD-10-CM | POA: Insufficient documentation

## 2012-04-29 DIAGNOSIS — F319 Bipolar disorder, unspecified: Secondary | ICD-10-CM | POA: Insufficient documentation

## 2012-04-29 DIAGNOSIS — I82409 Acute embolism and thrombosis of unspecified deep veins of unspecified lower extremity: Principal | ICD-10-CM | POA: Insufficient documentation

## 2012-04-29 DIAGNOSIS — F17200 Nicotine dependence, unspecified, uncomplicated: Secondary | ICD-10-CM | POA: Insufficient documentation

## 2012-04-29 DIAGNOSIS — I82401 Acute embolism and thrombosis of unspecified deep veins of right lower extremity: Secondary | ICD-10-CM | POA: Diagnosis present

## 2012-04-29 DIAGNOSIS — F39 Unspecified mood [affective] disorder: Secondary | ICD-10-CM | POA: Diagnosis present

## 2012-04-29 DIAGNOSIS — Z5181 Encounter for therapeutic drug level monitoring: Secondary | ICD-10-CM

## 2012-04-29 DIAGNOSIS — J441 Chronic obstructive pulmonary disease with (acute) exacerbation: Secondary | ICD-10-CM | POA: Diagnosis present

## 2012-04-29 DIAGNOSIS — J4489 Other specified chronic obstructive pulmonary disease: Secondary | ICD-10-CM | POA: Insufficient documentation

## 2012-04-29 DIAGNOSIS — Z86718 Personal history of other venous thrombosis and embolism: Secondary | ICD-10-CM | POA: Insufficient documentation

## 2012-04-29 DIAGNOSIS — J449 Chronic obstructive pulmonary disease, unspecified: Secondary | ICD-10-CM | POA: Diagnosis present

## 2012-04-29 DIAGNOSIS — Z7901 Long term (current) use of anticoagulants: Secondary | ICD-10-CM

## 2012-04-29 DIAGNOSIS — I2510 Atherosclerotic heart disease of native coronary artery without angina pectoris: Secondary | ICD-10-CM | POA: Diagnosis present

## 2012-04-29 DIAGNOSIS — Z720 Tobacco use: Secondary | ICD-10-CM

## 2012-04-29 MED ORDER — SODIUM CHLORIDE 0.9 % IV SOLN: 250.0000 mL | INTRAVENOUS | Status: DC | PRN

## 2012-04-29 MED ORDER — SODIUM CHLORIDE 0.9 % IJ SOLN: 3.0000 mL | Freq: Two times a day (BID) | INTRAMUSCULAR | Status: DC

## 2012-04-29 MED ORDER — RIVAROXABAN 20 MG PO TABS: 20.0000 mg | ORAL_TABLET | Freq: Every day | ORAL | Status: DC

## 2012-04-29 MED ORDER — METOPROLOL TARTRATE 12.5 MG HALF TABLET
12.5000 mg | ORAL_TABLET | Freq: Two times a day (BID) | ORAL | Status: DC
  Filled 2012-04-29: qty 1

## 2012-04-29 MED ORDER — RIVAROXABAN 15 MG PO TABS
15.0000 mg | ORAL_TABLET | Freq: Two times a day (BID) | ORAL | Status: DC
  Filled 2012-04-29 (×2): qty 1

## 2012-04-29 MED ORDER — ASPIRIN EC 81 MG PO TBEC: 81.0000 mg | DELAYED_RELEASE_TABLET | Freq: Every morning | ORAL | Status: DC

## 2012-04-29 MED ORDER — SODIUM CHLORIDE 0.9 % IJ SOLN: 3.0000 mL | INTRAMUSCULAR | Status: DC | PRN

## 2012-04-29 MED ORDER — RIVAROXABAN 15 MG PO TABS: 15.0000 mg | ORAL_TABLET | Freq: Two times a day (BID) | ORAL | Status: DC

## 2012-04-29 NOTE — ED Notes (Signed)
Follow up from hospital visit.

## 2012-04-29 NOTE — ED Provider Notes (Signed)
History     CSN: [id number]  Arrival date & time 04/29/12  1721   First MD Initiated Contact with Patient 04/29/12 1812      No chief complaint on file.    HPI 62 year old male with history of CAD, inf STEMI in 2012 s/p cath, COPD, hyperlipidemia, history of DVT in 2006 treated with coumadin was seen in the ED 1 week back with symptoms of right cough swelling. He had a Doppler of his eye care done which showed DVT of right femoral, right popliteal and right posterior tibial vein. He was given a 21 day supply of xarelto as per ED physician note but  patient informs that he received only about a week supply and may have 1-2 pills left.   he informs his calf swelling to have subsided and has minimal leg pain. Denies chest pain or SOB.  Past Medical History  Diagnosis Date  . CAD (coronary artery disease)     a. INF STEMI 07/04/10:  tx with thrombectomy + [facility] BMS to [facility];  cath 07/04/10: dLM 10-20%, pLAD 40-50%, mLAD 20-30%, pRCA 30%, mRCA occluded and tx with PCI, EF 50% with inf HK. A Multilink  . HLD (hyperlipidemia)   . COPD (chronic obstructive pulmonary disease)   . Tobacco abuse   . Bipolar disorder   . History of DVT (deep vein thrombosis)     traumatic, s/p coumadin tx.  . Glucose intolerance (impaired glucose tolerance)     A1c 6.2 06/2010  . Depression     Past Surgical History  Procedure Laterality Date  . Electrocardiogram      Showed inferolateral ST-elevation and a code STEMI was activated. In the ER, he was treated with morphine, herarin, and 600 mg of Plavix. He was transferred emergently to [facility].   . Stent placement      Family History  Problem Relation Age of Onset  . Coronary artery disease Father     History  Substance Use Topics  . Smoking status: Current Every Day Smoker -- 2.00 packs/day    Types: Cigarettes  . Smokeless tobacco: Current User  . Alcohol Use: No     Comment: occasionally      Review of Systems As outlined in  HPI Allergies  Penicillins  Home Medications   Current Outpatient Rx  Name  Route  Sig  Dispense  Refill  . aspirin EC 81 MG tablet   Oral   Take 81 mg by mouth every morning.          . lovastatin (MEVACOR) 40 MG tablet   Oral   Take 40 mg by mouth at bedtime.         . metoprolol tartrate (LOPRESSOR) 25 MG tablet   Oral   Take 12.5 mg by mouth 2 (two) times daily.         . naproxen sodium (ANAPROX) 220 MG tablet   Oral   Take 220 mg by mouth 2 (two) times daily as needed (pain).         Marland Kitchen oxyCODONE-acetaminophen (PERCOCET) 5-325 MG per tablet   Oral   Take 1 tablet by mouth every 6 (six) hours as needed for pain.   16 tablet   0   . Rivaroxaban (XARELTO) 15 MG TABS tablet   Oral   Take 1 tablet (15 mg total) by mouth daily.   30 tablet   0   . Rivaroxaban (XARELTO) 15 MG TABS tablet   Oral  Take 1 tablet (15 mg total) by mouth daily.   42 tablet   0     BP 125/81  Pulse 76  Temp(Src) 98.1 F (36.7 C) (Oral)  Resp 18  SpO2 97%  Physical Exam Elderly male in no acute distress HEENT: No pallor, moist oral mucosa Chest: Clear to auscultation bilaterally, no added sounds CVS: Normal S1 and S2, no murmurs rub or gallop Abdomen: Soft, nontender, nondistended, bowel sounds present Extremities: Warm, no edema, no cough swelling or tenderness CNS: The aorta is 3 ED Course  Procedures (including critical care time)  Labs Reviewed - No data to display No results found.   No diagnosis found.  Acute right lower extremity DVT Patient was sent on a prescription of xarelto for  3 weeks however he was only able to fill for about 7 days and remaining he had to get a voucher or pay out of pocket which he cannot afford. No acute DVT  And lack of insurance to afford newer anticoagulant he will need to be on coumadin and bridged with sq lovenox.  i have spoken with hospitalist Dr Mahala Menghini who agrees with direct admit to floor under observation overnight and  start on coumadin and bridge with sq lovenox until INR therapeutic.  SW consult to help him with few days supply with sq lovenox until INR therapeutic in coumadin.     MDM          Eddie North, MD 04/29/12 1843

## 2012-04-29 NOTE — H&P (Signed)
Chief Complaint:  New dvt cant afford xaralto  HPI: 62 yo male with new diagnosed rle dvt last week here at [facility] (h/o dvt in same leg several years ago took coumadin for a year).  Was sent home on xaralto 15mg  daily, pt has no health insurance.  CM in ED arranged for him to get 20 day supply but pt only actually got a 10 day supply and was arranged to f/u with clinic today.  He was only given 15 mg pill daily, and has 2 pills left.  He filled out paperwork last week at [employer] to get assistance from [employer] for xaralto long term.  He does not know if this has been approved yet.  Due to the unclear plan of his anticoagulation long term dr Dorthey Sawyer advised direct admit to switch to lovenox/coumadin.  Pt does not want to take coumadin.  Denies any cp or sob.  The swelling in his rt leg has markedly decreased since starting xaralto.  No hemoptysis.    Review of Systems:  Positive and negative as per HPI otherwise all other systems are negative  Past Medical History: Past Medical History  Diagnosis Date  . CAD (coronary artery disease)     a. INF STEMI 07/04/10:  tx with thrombectomy + Vision BMS to [facility];  cath 07/04/10: dLM 10-20%, pLAD 40-50%, mLAD 20-30%, pRCA 30%, mRCA occluded and tx with PCI, EF 50% with inf HK. A Multilink  . HLD (hyperlipidemia)   . COPD (chronic obstructive pulmonary disease)   . Tobacco abuse   . Bipolar disorder   . History of DVT (deep vein thrombosis)     traumatic, s/p coumadin tx.  . Glucose intolerance (impaired glucose tolerance)     A1c 6.2 06/2010  . Depression    Past Surgical History  Procedure Laterality Date  . Electrocardiogram      Showed inferolateral ST-elevation and a code STEMI was activated. In the ER, he was treated with morphine, herarin, and 600 mg of Plavix. He was transferred emergently to [facility].   . Stent placement      Medications: Prior to Admission medications   Medication Sig Start Date End Date Taking?  Authorizing Provider  aspirin EC 81 MG tablet Take 81 mg by mouth every morning.    Yes Historical Provider, MD  Ginkgo Biloba 40 MG TABS Take 1 tablet by mouth daily.   Yes Historical Provider, MD  lovastatin (MEVACOR) 40 MG tablet Take 40 mg by mouth at bedtime.   Yes Historical Provider, MD  metoprolol tartrate (LOPRESSOR) 25 MG tablet Take 12.5 mg by mouth 2 (two) times daily.   Yes Historical Provider, MD  naproxen sodium (ANAPROX) 220 MG tablet Take 220 mg by mouth 2 (two) times daily as needed (pain).   Yes Historical Provider, MD  oxyCODONE-acetaminophen (PERCOCET) 5-325 MG per tablet Take 1 tablet by mouth every 6 (six) hours as needed for pain. 04/22/12  Yes Tobin Chad, MD  Rivaroxaban (XARELTO) 15 MG TABS tablet Take 1 tablet (15 mg total) by mouth daily. 04/22/12  Yes Tobin Chad, MD  vitamin B-12 (CYANOCOBALAMIN) 1000 MCG tablet Take 1,000 mcg by mouth daily.   Yes Historical Provider, MD    Allergies:   Allergies  Allergen Reactions  . Penicillins Other (See Comments)    unknown reaction    Social History:  reports that he has been smoking Cigarettes.  He has been smoking about 2.00 packs per day. He uses smokeless  tobacco. He reports that he does not drink alcohol. His drug history is not on file.  Family History: Family History  Problem Relation Age of Onset  . Coronary artery disease Father     Physical Exam: Filed Vitals:   04/29/12 2044  BP: 129/86  Pulse: 64  Temp: 97.8 F (36.6 C)  TempSrc: Oral  Resp: 20  Height: 5\' 10"  (1.778 m)  Weight: 93.849 kg (206 lb 14.4 oz)  SpO2: 94%   General appearance: alert, cooperative and no distress Head: Normocephalic, without obvious abnormality, atraumatic Eyes: negative Nose: Nares normal. Septum midline. Mucosa normal. No drainage or sinus tenderness. Neck: no JVD and supple, symmetrical, trachea midline Lungs: clear to auscultation bilaterally Heart: regular rate and rhythm, S1, S2 normal, no murmur,  click, rub or gallop Abdomen: soft, non-tender; bowel sounds normal; no masses,  no organomegaly Extremities: extremities normal, atraumatic, no cyanosis or edema Pulses: 2+ and symmetric Skin: Skin color, texture, turgor normal. No rashes or lesions Neurologic: Grossly normal    Labs on Admission:  pending  Radiological Exams on Admission: No results found.  Assessment/Plan  62 yo male with recent recurrent rle dvt in need of arrangement for long term anticoagulation  Principal Problem:   Inadequate anticoagulation Active Problems:   CAD (coronary artery disease)   COPD (chronic obstructive pulmonary disease)   Right leg DVT   Bipolar disorder  Will place on xaralto 15mg  bid appropriate dosing for now, CM will need to be involved to help arrange the most cost effective long term plan for this gentlemen who has no health insurance.  He is agreeable to stay overnight for now, initially wanting to leave AMA.  Will need life long anticoagulation (second dvt).  Will need to check if his application from [employer] has been approved thru [employer] and if indeed he will be able to get assistance with xaralto long term.  Called CM on call, will not be able to assist until the morning.  obs overnight.  Full code.  DAVID,RACHAL A 04/29/2012, 8:52 PM

## 2012-04-29 NOTE — Progress Notes (Signed)
Pt admitted to the unit. Pt is alert and oriented. Pt oriented to room, staff, and call bell. Bed in lowest position. Admissions paged.  Anthony [name] E

## 2012-04-29 NOTE — Progress Notes (Signed)
Pt decided to leave against medical advice. He has been informed of the risks involved in leaving the hospital without receiving further care. Dr. Onalee Hua paged. AMA form signed and placed in the chart.

## 2012-05-01 NOTE — Discharge Summary (Signed)
Pt left AMA °

## 2012-05-26 NOTE — Progress Notes (Signed)
05/26/12-1514-J.Mahrosh Donnell,RN,BSN [id number]      Received call from patient, very angry and verbally abusive. States, " I need you to call me in 21 more xarelto pills cause I didn't get two a day like I was supposed to. I only got one a day." EDCM explained to patient that he received the correct prescription upon discharge from the ED on the 15th of April. He states, Dr. Lorenso Courier needs to give me another prescription. [facility] asked patient if he had kept [facility]nd of April. He stated, "yes but they weren't any help. They wanted to put me on coumadin so we had a falling out. Then they sent me to the hospital to be admitted." [facility] attempted to explain to patient that according to notes, patient left hospital AMA on 4/24 and that he needed to pursue finding a PCP to move forward for treatment. Patient states, " Of course, [facility] just passes the buck so no one has to take responsibility. I will just find it on my own, or maybe mug him in the parking lot. Thanks for nothing!" Patient hung up the phone.

## 2012-05-29 ENCOUNTER — Encounter (HOSPITAL_COMMUNITY): Payer: Self-pay | Admitting: *Deleted

## 2012-05-29 ENCOUNTER — Emergency Department (INDEPENDENT_AMBULATORY_CARE_PROVIDER_SITE_OTHER)
Admission: EM | Admit: 2012-05-29 | Discharge: 2012-05-29 | Discharge status: Against Medical Advice | Payer: Self-pay | Source: Home / Self Care

## 2012-05-29 DIAGNOSIS — Z9119 Patient's noncompliance with other medical treatment and regimen: Secondary | ICD-10-CM

## 2012-05-29 DIAGNOSIS — I82409 Acute embolism and thrombosis of unspecified deep veins of unspecified lower extremity: Secondary | ICD-10-CM

## 2012-05-29 DIAGNOSIS — Z91199 Patient's noncompliance with other medical treatment and regimen due to unspecified reason: Secondary | ICD-10-CM

## 2012-05-29 NOTE — ED Notes (Signed)
Pt  Here  Today for  followup  From    Recent  hosp   Admission for a  Blood  Clot          Pt  States  He  Is  Taking  His    xarelta    But    According to  His  Statement  He  Left the  Hospital ama        When asked  Had  He  Seen a  pcp    Since  The   ama  He  Stated  He  Was  Seen at the    [facility]  Dept   - he  denys  Any  Chest pain or  Any  Shortness  Of  Breath  He  Ambulated  To  Room  With a  Steady  Fluid  Gait   He  Reports  Some  Pain in    The  Affected  Leg    He  States  He  Is   Almost  Out of his   xarelta

## 2012-05-29 NOTE — ED Notes (Signed)
Pt    Was  Noted   Walking  Out  Of  Treatment  Room      With a  Brisk  Gait       After  discussing  Plan of  Care  With the  Provider

## 2012-05-29 NOTE — ED Provider Notes (Signed)
History     CSN: [id number]  Arrival date & time 05/29/12  1257   None     Chief Complaint  Patient presents with  . Follow-up    (Consider location/radiation/quality/duration/timing/severity/associated sxs/prior treatment) HPI Comments: Pt presents requesting refill on his xarelto.  He has 4 days left on it at this point.  He questions whether he had the right dosage prescribed originally.  He tried to go to the [facility] department for a refill but they would not give him one.  He has not kept follow-up with primary care that he was sent to following last hospital visit either.  He states he went to a PCP visit initially and they tried to put him on coumadin and then sent him into the hospital.  When he started to think the hospital wanted to put him on coumadin, he left AMA.  States he does not want to go to hospital and doesn't want to be started on coumadin, he just wants a refill on his medications.     Past Medical History  Diagnosis Date  . CAD (coronary artery disease)     a. INF STEMI 07/04/10:  tx with thrombectomy + Vision BMS to [facility];  cath 07/04/10: dLM 10-20%, pLAD 40-50%, mLAD 20-30%, pRCA 30%, mRCA occluded and tx with PCI, EF 50% with inf HK. A Multilink  . HLD (hyperlipidemia)   . COPD (chronic obstructive pulmonary disease)   . Tobacco abuse   . Bipolar disorder   . History of DVT (deep vein thrombosis)     traumatic, s/p coumadin tx.  . Glucose intolerance (impaired glucose tolerance)     A1c 6.2 06/2010  . Depression     Past Surgical History  Procedure Laterality Date  . Electrocardiogram      Showed inferolateral ST-elevation and a code STEMI was activated. In the ER, he was treated with morphine, herarin, and 600 mg of Plavix. He was transferred emergently to [facility].   . Stent placement      Family History  Problem Relation Age of Onset  . Coronary artery disease Father     History  Substance Use Topics  . Smoking  status: Current Every Day Smoker -- 2.00 packs/day    Types: Cigarettes  . Smokeless tobacco: Current User  . Alcohol Use: No     Comment: occasionally      Review of Systems  Cardiovascular: Positive for leg swelling.    Allergies  Penicillins  Home Medications   Current Outpatient Rx  Name  Route  Sig  Dispense  Refill  . aspirin EC 81 MG tablet   Oral   Take 81 mg by mouth every morning.          . Ginkgo Biloba 40 MG TABS   Oral   Take 1 tablet by mouth daily.         Marland Kitchen lovastatin (MEVACOR) 40 MG tablet   Oral   Take 40 mg by mouth at bedtime.         . metoprolol tartrate (LOPRESSOR) 25 MG tablet   Oral   Take 12.5 mg by mouth 2 (two) times daily.         . naproxen sodium (ANAPROX) 220 MG tablet   Oral   Take 220 mg by mouth 2 (two) times daily as needed (pain).         Marland Kitchen oxyCODONE-acetaminophen (PERCOCET) 5-325 MG per tablet   Oral   Take 1  tablet by mouth every 6 (six) hours as needed for pain.   16 tablet   0   . Rivaroxaban (XARELTO) 15 MG TABS tablet   Oral   Take 1 tablet (15 mg total) by mouth daily.   30 tablet   0   . vitamin B-12 (CYANOCOBALAMIN) 1000 MCG tablet   Oral   Take 1,000 mcg by mouth daily.           BP 134/93  Pulse 78  Temp(Src) 98.1 F (36.7 C) (Oral)  SpO2 100%  Physical Exam  Musculoskeletal: He exhibits edema.    ED Course  Procedures (including critical care time)  Labs Reviewed - No data to display No results found.   No diagnosis found.    MDM  Discussed with pt his options, including the importance of primary care.  We discussed how this is not something to be handled in urgent care.  While we were discussing proper utilization of the healthcare system and what this pt needs in terms of management of his condition, pt left the room, stating "well then thanks for nothing."  Walked him out and encouraged him to call the community care service that he was originally referred to right now.           Graylon Good, PA-C 05/29/12 1356  Graylon Good, PA-C 05/29/12 1423  Graylon Good, PA-C 05/29/12 1549

## 2012-06-05 NOTE — ED Provider Notes (Signed)
Medical screening examination/treatment/procedure(s) were performed by non-physician practitioner and as supervising physician I was immediately available for consultation/collaboration.   [facility]; MD  Sharin Grave, MD 06/05/12 1016

## 2012-12-13 ENCOUNTER — Emergency Department (HOSPITAL_COMMUNITY)
Admission: EM | Admit: 2012-12-13 | Discharge: 2012-12-16 | Disposition: A | Discharge status: Other Acute Inpatient Hospital | Payer: 59 | Attending: Emergency Medicine | Admitting: Emergency Medicine

## 2012-12-13 ENCOUNTER — Emergency Department (HOSPITAL_COMMUNITY): Payer: Self-pay

## 2012-12-13 ENCOUNTER — Encounter (HOSPITAL_COMMUNITY): Payer: Self-pay | Admitting: Emergency Medicine

## 2012-12-13 ENCOUNTER — Other Ambulatory Visit: Payer: Self-pay

## 2012-12-13 ENCOUNTER — Ambulatory Visit (HOSPITAL_COMMUNITY)
Admission: AD | Admit: 2012-12-13 | Discharge: 2012-12-13 | Disposition: A | Discharge status: Home / Self Care | Payer: Self-pay | Attending: Psychiatry | Admitting: Psychiatry

## 2012-12-13 DIAGNOSIS — J449 Chronic obstructive pulmonary disease, unspecified: Secondary | ICD-10-CM | POA: Insufficient documentation

## 2012-12-13 DIAGNOSIS — F17200 Nicotine dependence, unspecified, uncomplicated: Secondary | ICD-10-CM | POA: Insufficient documentation

## 2012-12-13 DIAGNOSIS — F329 Major depressive disorder, single episode, unspecified: Secondary | ICD-10-CM

## 2012-12-13 DIAGNOSIS — I2510 Atherosclerotic heart disease of native coronary artery without angina pectoris: Secondary | ICD-10-CM | POA: Insufficient documentation

## 2012-12-13 DIAGNOSIS — J4489 Other specified chronic obstructive pulmonary disease: Secondary | ICD-10-CM | POA: Insufficient documentation

## 2012-12-13 DIAGNOSIS — Z79899 Other long term (current) drug therapy: Secondary | ICD-10-CM | POA: Insufficient documentation

## 2012-12-13 DIAGNOSIS — K4090 Unilateral inguinal hernia, without obstruction or gangrene, not specified as recurrent: Secondary | ICD-10-CM | POA: Insufficient documentation

## 2012-12-13 DIAGNOSIS — F3130 Bipolar disorder, current episode depressed, mild or moderate severity, unspecified: Secondary | ICD-10-CM | POA: Insufficient documentation

## 2012-12-13 DIAGNOSIS — Z880 Allergy status to penicillin: Secondary | ICD-10-CM | POA: Insufficient documentation

## 2012-12-13 DIAGNOSIS — Z7901 Long term (current) use of anticoagulants: Secondary | ICD-10-CM | POA: Insufficient documentation

## 2012-12-13 DIAGNOSIS — E785 Hyperlipidemia, unspecified: Secondary | ICD-10-CM | POA: Insufficient documentation

## 2012-12-13 DIAGNOSIS — Z7982 Long term (current) use of aspirin: Secondary | ICD-10-CM | POA: Insufficient documentation

## 2012-12-13 DIAGNOSIS — F319 Bipolar disorder, unspecified: Secondary | ICD-10-CM

## 2012-12-13 DIAGNOSIS — Z86718 Personal history of other venous thrombosis and embolism: Secondary | ICD-10-CM | POA: Insufficient documentation

## 2012-12-13 DIAGNOSIS — Z9861 Coronary angioplasty status: Secondary | ICD-10-CM | POA: Insufficient documentation

## 2012-12-13 LAB — COMPREHENSIVE METABOLIC PANEL
Alkaline Phosphatase: 73 U/L (ref 39–117)
BUN: 14 mg/dL (ref 6–23)
CO2: 23 mEq/L (ref 19–32)
Chloride: 97 mEq/L (ref 96–112)
GFR calc Af Amer: >90 mL/min (ref >90)
Glucose, Bld: 204 mg/dL — ABNORMAL HIGH (ref 70–99)
Potassium: 4.2 mEq/L (ref 3.5–5.1)
Total Bilirubin: 0.5 mg/dL (ref 0.3–1.2)

## 2012-12-13 LAB — CBC
HCT: 46.6 % (ref 39.0–52.0)
Hemoglobin: 16.8 g/dL (ref 13.0–17.0)
WBC: 9.3 10*3/uL (ref 4.0–10.5)

## 2012-12-13 LAB — RAPID URINE DRUG SCREEN, HOSP PERFORMED: Barbiturates: NOT DETECTED

## 2012-12-13 LAB — ACETAMINOPHEN LEVEL: Acetaminophen (Tylenol), Serum: <15 ug/mL (ref 10–30)

## 2012-12-13 LAB — POCT I-STAT TROPONIN I: Troponin i, poc: 0 ng/mL (ref 0.00–0.08)

## 2012-12-13 LAB — ETHANOL: Alcohol, Ethyl (B): <11 mg/dL (ref 0–11)

## 2012-12-13 MED ORDER — SIMVASTATIN 5 MG PO TABS: 5.0000 mg | ORAL_TABLET | Freq: Every day | ORAL | Status: DC

## 2012-12-13 MED ORDER — RIVAROXABAN 15 MG PO TABS
15.0000 mg | ORAL_TABLET | Freq: Every day | ORAL | Status: DC
  Filled 2012-12-13: qty 1

## 2012-12-13 MED ORDER — NICOTINE 21 MG/24HR TD PT24
21.0000 mg | MEDICATED_PATCH | Freq: Every day | TRANSDERMAL | Status: DC
  Administered 2012-12-15 – 2012-12-16 (×2): 21 mg via TRANSDERMAL
  Filled 2012-12-13 (×3): qty 1

## 2012-12-13 MED ORDER — METOPROLOL TARTRATE 25 MG PO TABS
12.5000 mg | ORAL_TABLET | Freq: Two times a day (BID) | ORAL | Status: DC
  Administered 2012-12-14 – 2012-12-16 (×4): 12.5 mg via ORAL
  Filled 2012-12-13 (×5): qty 1

## 2012-12-13 MED ORDER — LORAZEPAM 1 MG PO TABS
1.0000 mg | ORAL_TABLET | Freq: Three times a day (TID) | ORAL | Status: DC | PRN
  Administered 2012-12-14 – 2012-12-16 (×3): 1 mg via ORAL
  Filled 2012-12-13 (×3): qty 1

## 2012-12-13 MED ORDER — ASPIRIN EC 81 MG PO TBEC
81.0000 mg | DELAYED_RELEASE_TABLET | Freq: Every morning | ORAL | Status: DC
  Filled 2012-12-13: qty 1

## 2012-12-13 NOTE — Progress Notes (Addendum)
Writer consulted with the NP Drenda Freeze) regarding the patient.  Drenda Freeze reports that the patient has to be medically cleared then referred to other hospitals because there are no beds at [facility]   Writer informed the Horticulturist, commercial) that the patient will be in route for medical clearance and the patient has not been accepted to [facility].  Per Drenda Freeze, the patient does meet criteria for inpatient hospitalization.  Patient was a walk in at [facility].  Patient reports SI with a plan to overdose on his blood pressure medication and aspirin or by cutting his wrist and ankles.   After the patient is medically cleared the disposition Tech and the TTS counselor will refer the patient to other hospitals for psychiatric inpatient hospitalization.

## 2012-12-13 NOTE — BH Assessment (Signed)
Assessment Note  Patient is a 62 year old while male with SI and a plan to overdose on his blood thinner medication and aspirin or by cutting his nkles and wrists.  Patient reports that he is not able to contract for safety.  Writer attempted suicide in 1995 when he took an overdose on sleeping pills.      Patient reports increased feelings of hopelessness and depression.  Patient reports a previous diagnosis of Bipolar and Major Depressive Disorder, Recurrent.  Patient denies prior psychiatric hospitalization.  Patient reports medication management previously at [facility].  Patient reports that he has not been taking his medication as prescribed.  Patient was not able to tell me the last time that he has taken his medication.    Patient denies substance abuse.  Patient denies HI.  Patient denies psychosis.       Axis I: Major Depression, Recurrent severe Axis II: Deferred Axis III:  Past Medical History  Diagnosis Date  . CAD (coronary artery disease)     a. INF STEMI 07/04/10:  tx with thrombectomy + Vision BMS to [facility];  cath 07/04/10: dLM 10-20%, pLAD 40-50%, mLAD 20-30%, pRCA 30%, mRCA occluded and tx with PCI, EF 50% with inf HK. A Multilink  . HLD (hyperlipidemia)   . COPD (chronic obstructive pulmonary disease)   . Tobacco abuse   . Bipolar disorder   . History of DVT (deep vein thrombosis)     traumatic, s/p coumadin tx.  . Glucose intolerance (impaired glucose tolerance)     A1c 6.2 06/2010  . Depression    Axis IV: economic problems, housing problems, occupational problems, other psychosocial or environmental problems, problems related to social environment, problems with access to health care services and problems with primary support group Axis V: 31-40 impairment in reality testing  Past Medical History:  Past Medical History  Diagnosis Date  . CAD (coronary artery disease)     a. INF STEMI 07/04/10:  tx with thrombectomy + Vision BMS to [facility];  cath 07/04/10: dLM 10-20%, pLAD  40-50%, mLAD 20-30%, pRCA 30%, mRCA occluded and tx with PCI, EF 50% with inf HK. A Multilink  . HLD (hyperlipidemia)   . COPD (chronic obstructive pulmonary disease)   . Tobacco abuse   . Bipolar disorder   . History of DVT (deep vein thrombosis)     traumatic, s/p coumadin tx.  . Glucose intolerance (impaired glucose tolerance)     A1c 6.2 06/2010  . Depression     Past Surgical History  Procedure Laterality Date  . Electrocardiogram      Showed inferolateral ST-elevation and a code STEMI was activated. In the ER, he was treated with morphine, herarin, and 600 mg of Plavix. He was transferred emergently to [facility].   . Stent placement      Family History:  Family History  Problem Relation Age of Onset  . Coronary artery disease Father     Social History:  reports that he has been smoking Cigarettes.  He has been smoking about 2.00 packs per day. He uses smokeless tobacco. He reports that he does not drink alcohol. His drug history is not on file.  Additional Social History:     CIWA:   COWS:    Allergies:  Allergies  Allergen Reactions  . Penicillins Other (See Comments)    unknown reaction    Home Medications:  (Not in a hospital admission)  OB/GYN Status:  No LMP for male patient.  General Assessment Data Location of Assessment: [facility] Is this a Tele or Face-to-Face Assessment?: Face-to-Face Is this an Initial Assessment or a Re-assessment for this encounter?: Initial Assessment Living Arrangements: Alone Can pt return to current living arrangement?: Yes Admission Status: Voluntary Is patient capable of signing voluntary admission?: Yes Transfer from: Acute Hospital Referral Source: Self/Family/Friend  Medical Screening Exam [facility] Walk-in ONLY) Medical Exam completed: Yes  [facility] Crisis Care Plan Living Arrangements: Alone Name of Psychiatrist: NA Name of Therapist: NA  Education Status Is patient currently in school?:  No Current Grade: NA Highest grade of school patient has completed: NA Name of school: NA Contact person: NA  Risk to self Suicidal Ideation: Yes-Currently Present Suicidal Intent: Yes-Currently Present Is patient at risk for suicide?: Yes Suicidal Plan?: Yes-Currently Present Specify Current Suicidal Plan: Overdosing in his blood thinner medication and aspirin Access to Means: Yes Specify Access to Suicidal Means: medication  What has been your use of drugs/alcohol within the last 12 months?: None Previous Attempts/Gestures: Yes How many times?: 1 Other Self Harm Risks: None Triggers for Past Attempts: Unpredictable Intentional Self Injurious Behavior: None Family Suicide History: No Recent stressful life event(s): Job Loss;Financial Problems;Conflict (Comment);Divorce Persecutory voices/beliefs?: No Depression: Yes Depression Symptoms: Despondent;Isolating;Tearfulness;Guilt;Feeling worthless/self pity;Fatigue Substance abuse history and/or treatment for substance abuse?: No Suicide prevention information given to non-admitted patients: Yes  Risk to Others Homicidal Ideation: No Thoughts of Harm to Others: Yes-Currently Present Comment - Thoughts of Harm to Others: None Current Homicidal Intent: No Current Homicidal Plan: No Access to Homicidal Means: No Identified Victim: None Reported History of harm to others?: No Assessment of Violence: None Noted Violent Behavior Description: calm Does patient have access to weapons?: No Criminal Charges Pending?: No Does patient have a court date: No  Psychosis Hallucinations: None noted Delusions: None noted  Mental Status Report Appear/Hygiene: Body odor;Disheveled;Layered clothes;Poor hygiene Eye Contact: Poor Motor Activity: Freedom of movement Speech: Logical/coherent Level of Consciousness: Alert Mood: Depressed Affect: Blunted Anxiety Level: None Thought Processes: Coherent;Relevant Judgement:  Unimpaired Orientation: Person;Place;Time;Situation Obsessive Compulsive Thoughts/Behaviors: None  Cognitive Functioning Concentration: Decreased Memory: Recent Intact;Remote Intact IQ: Average Insight: Fair Impulse Control: Poor Appetite: Fair Weight Loss: 0 Weight Gain: 0 Sleep: Decreased Total Hours of Sleep: 3 Vegetative Symptoms: Decreased grooming;Not bathing  ADLScreening [facility] Assessment Services) Patient's cognitive ability adequate to safely complete daily activities?: Yes Patient able to express need for assistance with ADLs?: Yes Independently performs ADLs?: Yes (appropriate for developmental age)  Prior Inpatient Therapy Prior Inpatient Therapy: No Prior Therapy Dates: NA Prior Therapy Facilty/Provider(s): NA Reason for Treatment: NA  Prior Outpatient Therapy Prior Outpatient Therapy: Yes Prior Therapy Dates: 2014 Prior Therapy Facilty/Provider(s): [facility] Reason for Treatment: Medication Management   ADL Screening (condition at time of admission) Patient's cognitive ability adequate to safely complete daily activities?: Yes Patient able to express need for assistance with ADLs?: Yes Independently performs ADLs?: Yes (appropriate for developmental age)                  Additional Information 1:1 In Past 12 Months?: No CIRT Risk: No Elopement Risk: No Does patient have medical clearance?: Yes     Disposition:  Disposition Initial Assessment Completed for this Encounter: Yes Disposition of Patient: Inpatient treatment program Type of inpatient treatment program: Adult  On Site Evaluation by:   Reviewed with Physician:    Phillip Heal LaVerne 12/13/2012 10:32 PM

## 2012-12-13 NOTE — ED Notes (Addendum)
Pt presents today with frequent thoughts of suicide. Pt reports his plan is to overdose on Xarelto and aspirin or by cutting his wrists, ankles, or throat. Pt reports having a stent placed in his heart two years ago and having a DVT seven months ago. Pt denies HI. Pt denies using any drugs. Pt denies auditory and visual hallucinations. Pt reports trouble falling asleep. Pt also voices concern that his "scrotal sac has something growing in it."

## 2012-12-13 NOTE — ED Provider Notes (Addendum)
CSN: [id number]     Arrival date & time 12/13/12  2145 History   First MD Initiated Contact with Patient 12/13/12 2229     Chief Complaint  Patient presents with  . Medical Clearance   (Consider location/radiation/quality/duration/timing/severity/associated sxs/prior Treatment) The history is provided by the patient.  pt with hx cad, depression, copd, c/o feeing increasingly depressed in the past few weeks. States he is depressed about not working, and the limitations his chronic health problems have placed on his life.  Lives alone. Denies specific acute stressor in recent weeks. Remote hx etoh abuse, but none x years. States compliant w his normal meds, no recent change in doses. States thoughts of suicide but denies any attempt, denies overdose. States physical health at baseline w exception that he had noticed a swollen area between bil testicles. Denies pain. No dysuria. No skin changes or redness to area. No prior eval for same. Denies wt loss. States is eating and drinking normally. Is having trouble sleeping at night.      Past Medical History  Diagnosis Date  . CAD (coronary artery disease)     a. INF STEMI 07/04/10:  tx with thrombectomy + Vision BMS to [facility];  cath 07/04/10: dLM 10-20%, pLAD 40-50%, mLAD 20-30%, pRCA 30%, mRCA occluded and tx with PCI, EF 50% with inf HK. A Multilink  . HLD (hyperlipidemia)   . COPD (chronic obstructive pulmonary disease)   . Tobacco abuse   . Bipolar disorder   . History of DVT (deep vein thrombosis)     traumatic, s/p coumadin tx.  . Glucose intolerance (impaired glucose tolerance)     A1c 6.2 06/2010  . Depression    Past Surgical History  Procedure Laterality Date  . Electrocardiogram      Showed inferolateral ST-elevation and a code STEMI was activated. In the ER, he was treated with morphine, herarin, and 600 mg of Plavix. He was transferred emergently to [facility].   . Stent placement     Family History  Problem Relation Age  of Onset  . Coronary artery disease Father    History  Substance Use Topics  . Smoking status: Current Every Day Smoker -- 2.00 packs/day    Types: Cigarettes  . Smokeless tobacco: Current User  . Alcohol Use: No     Comment: occasionally    Review of Systems  Constitutional: Negative for fever.  HENT: Negative for sore throat.   Eyes: Negative for redness.  Respiratory: Negative for shortness of breath.   Cardiovascular: Negative for chest pain.  Gastrointestinal: Negative for abdominal pain.  Genitourinary: Negative for dysuria, flank pain and testicular pain.  Musculoskeletal: Negative for back pain and neck pain.  Skin: Negative for rash.  Neurological: Negative for headaches.  Hematological: Does not bruise/bleed easily.  Psychiatric/Behavioral: Positive for dysphoric mood.    Allergies  Penicillins  Home Medications   Current Outpatient Rx  Name  Route  Sig  Dispense  Refill  . aspirin EC 81 MG tablet   Oral   Take 81 mg by mouth every morning.          . Ginkgo Biloba 40 MG TABS   Oral   Take 1 tablet by mouth daily.         Marland Kitchen lovastatin (MEVACOR) 40 MG tablet   Oral   Take 40 mg by mouth at bedtime.         . metoprolol tartrate (LOPRESSOR) 25 MG tablet   Oral  Take 12.5 mg by mouth 2 (two) times daily.         . naproxen sodium (ANAPROX) 220 MG tablet   Oral   Take 220 mg by mouth 2 (two) times daily as needed (pain).         Marland Kitchen oxyCODONE-acetaminophen (PERCOCET) 5-325 MG per tablet   Oral   Take 1 tablet by mouth every 6 (six) hours as needed for pain.   16 tablet   0   . Rivaroxaban (XARELTO) 15 MG TABS tablet   Oral   Take 1 tablet (15 mg total) by mouth daily.   30 tablet   0   . vitamin B-12 (CYANOCOBALAMIN) 1000 MCG tablet   Oral   Take 1,000 mcg by mouth daily.          BP 116/88  Pulse 97  Temp(Src) 98.3 F (36.8 C) (Oral)  Resp 15  Ht 5\' 10"  (1.778 m)  Wt 210 lb (95.255 kg)  BMI 30.13 kg/m2  SpO2  95% Physical Exam  Nursing note and vitals reviewed. Constitutional: He is oriented to person, place, and time. He appears well-developed and well-nourished. No distress.  HENT:  Head: Atraumatic.  Eyes: Conjunctivae are normal. No scleral icterus.  Neck: Neck supple. No tracheal deviation present.  Cardiovascular: Normal rate, normal heart sounds and intact distal pulses.   Pulmonary/Chest: Effort normal and breath sounds normal. No accessory muscle usage. No respiratory distress.  Abdominal: Soft. Bowel sounds are normal. He exhibits no distension. There is no tenderness.  Genitourinary:  Mild fullness ?mass mid scrotum. No discrete testicular mass noted. No abscess. No tenderness. No skin changes, rash or lesion. No penile discharge. No fem/ing l/a.   Musculoskeletal: Normal range of motion. He exhibits no edema and no tenderness.  Neurological: He is alert and oriented to person, place, and time.  Steady gait.   Skin: Skin is warm and dry. He is not diaphoretic.  Psychiatric:  Depressed mood/flat affect.       ED Course  Procedures (including critical care time)  Results for orders placed during the hospital encounter of 12/13/12  ACETAMINOPHEN LEVEL      Result Value Range   Acetaminophen (Tylenol), Serum <15.0  10 - 30 ug/mL  CBC      Result Value Range   WBC 9.3  4.0 - 10.5 K/uL   RBC 5.27  4.22 - 5.81 MIL/uL   Hemoglobin 16.8  13.0 - 17.0 g/dL   HCT 16.1  09.6 - 04.5 %   MCV 88.4  78.0 - 100.0 fL   MCH 31.9  26.0 - 34.0 pg   MCHC 36.1 (*) 30.0 - 36.0 g/dL   RDW 40.9  81.1 - 91.4 %   Platelets 207  150 - 400 K/uL  COMPREHENSIVE METABOLIC PANEL      Result Value Range   Sodium 133 (*) 135 - 145 mEq/L   Potassium 4.2  3.5 - 5.1 mEq/L   Chloride 97  96 - 112 mEq/L   CO2 23  19 - 32 mEq/L   Glucose, Bld 204 (*) 70 - 99 mg/dL   BUN 14  6 - 23 mg/dL   Creatinine, Ser 7.82  0.50 - 1.35 mg/dL   Calcium 9.6  8.4 - 95.6 mg/dL   Total Protein 7.7  6.0 - 8.3 g/dL    Albumin 4.1  3.5 - 5.2 g/dL   AST 70 (*) 0 - 37 U/L   ALT 85 (*) 0 - 53 U/L  Alkaline Phosphatase 73  39 - 117 U/L   Total Bilirubin 0.5  0.3 - 1.2 mg/dL   GFR calc non Af Amer 88 (*) >90 mL/min   GFR calc Af Amer >90  >90 mL/min  ETHANOL      Result Value Range   Alcohol, Ethyl (B) <11  0 - 11 mg/dL  SALICYLATE LEVEL      Result Value Range   Salicylate Lvl <2.0 (*) 2.8 - 20.0 mg/dL  URINE RAPID DRUG SCREEN (HOSP PERFORMED)      Result Value Range   Opiates NONE DETECTED  NONE DETECTED   Cocaine NONE DETECTED  NONE DETECTED   Benzodiazepines NONE DETECTED  NONE DETECTED   Amphetamines NONE DETECTED  NONE DETECTED   Tetrahydrocannabinol NONE DETECTED  NONE DETECTED   Barbiturates NONE DETECTED  NONE DETECTED  POCT I-STAT TROPONIN I      Result Value Range   Troponin i, poc 0.00  0.00 - 0.08 ng/mL   Comment 3            US Scrotum  12/14/2012   CLINICAL DATA:  Scrotal fullness; assess for scrotal mass.  EXAM: ULTRASOUND OF SCROTUM  TECHNIQUE: Complete ultrasound examination of the testicles, epididymis, and other scrotal structures was performed.  COMPARISON:  None.  FINDINGS: Right testicle  Measurements: 4.8 x 2.1 x 3.1 cm. No mass or microlithiasis visualized.  Left testicle  Measurements: 3.4 x 2.3 x 3.4 cm. No mass or microlithiasis visualized.  Limited Doppler evaluation demonstrates normal color Doppler blood flow respect to both testes; there is no evidence of testicular torsion.  Right epididymis: A few small cysts are seen at the right epididymal head, measuring up to 0.5 cm in size. The right epididymis is otherwise unremarkable in appearance.  Left epididymis: A 0.9 cm cyst is noted at the left epididymal head; the left epididymis is otherwise unremarkable in appearance.  Hydrocele:  None visualized.  Varicocele:  None visualized.  A relatively large left inguinal hernia is noted, containing only fat, extending into the left scrotum. The fat measures approximately 23.2 x 4.5  cm; no associated bowel is seen. This appears to explain the patient's scrotal fullness. There is no evidence of peristalsis.  IMPRESSION: 1. The patient's scrotal fullness appears to reflect a large left inguinal hernia, containing only fat. A large amount of fat is seen extending into the left scrotum. No associated bowel seen. No suspicious masses identified. 2. Testes unremarkable in appearance; no evidence of testicular torsion. 3. Scattered bilateral epididymal head cysts incidentally noted.   Electronically Signed   By: Roanna Raider M.D.   On: 12/14/2012 01:00        EKG Interpretation   None       MDM  Labs.  Reviewed nursing notes and prior charts for additional history.   Psych team called to assess.  Labs reviewed, mild elev ast/alt. No abd pain or nv. Denies etoh abuse. Will hold statin rx given elev lfts.  Psych eval pending. Signed out to oncoming provider to follow up with psych eval, dispo appropriately.   Pt has no abd pain or tenderness. No scrotal/testicular pain/tenderness on recheck.  U/s results c/w inguinal hernia, also noted epididymal cysts - as no pain, not acutely incarcerated, patient can follow up with general surgeon as outpt.   Psych dispo pending.     Suzi Roots, MD 12/14/12 1556

## 2012-12-14 ENCOUNTER — Encounter (HOSPITAL_COMMUNITY): Payer: Self-pay | Admitting: Registered Nurse

## 2012-12-14 DIAGNOSIS — F332 Major depressive disorder, recurrent severe without psychotic features: Secondary | ICD-10-CM

## 2012-12-14 DIAGNOSIS — R45851 Suicidal ideations: Secondary | ICD-10-CM

## 2012-12-14 MED ORDER — RIVAROXABAN 20 MG PO TABS
20.0000 mg | ORAL_TABLET | Freq: Every day | ORAL | Status: DC
  Administered 2012-12-14 – 2012-12-15 (×2): 20 mg via ORAL
  Filled 2012-12-14 (×3): qty 1

## 2012-12-14 MED ORDER — ASPIRIN EC 81 MG PO TBEC
81.0000 mg | DELAYED_RELEASE_TABLET | Freq: Every day | ORAL | Status: DC
  Administered 2012-12-14 – 2012-12-16 (×3): 81 mg via ORAL
  Filled 2012-12-14 (×3): qty 1

## 2012-12-14 MED ORDER — RIVAROXABAN 15 MG PO TABS
15.0000 mg | ORAL_TABLET | Freq: Every day | ORAL | Status: DC
  Filled 2012-12-14: qty 1

## 2012-12-14 MED ORDER — TRAZODONE HCL 50 MG PO TABS
50.0000 mg | ORAL_TABLET | Freq: Every evening | ORAL | Status: DC | PRN
  Administered 2012-12-14 – 2012-12-15 (×3): 50 mg via ORAL
  Filled 2012-12-14 (×3): qty 1

## 2012-12-14 NOTE — Consult Note (Signed)
[facility] Face-to-Face Psychiatry Consult   Reason for Consult:  Suicidal thinking and plan to overdose Referring Physician:  EDP  Anthony [name] is an 62 y.o. male.  Assessment: AXIS I:  Major Depression, Recurrent severe AXIS II:  Deferred AXIS III:   Past Medical History  Diagnosis Date  . CAD (coronary artery disease)     a. INF STEMI 07/04/10:  tx with thrombectomy + Vision BMS to [facility];  cath 07/04/10: dLM 10-20%, pLAD 40-50%, mLAD 20-30%, pRCA 30%, mRCA occluded and tx with PCI, EF 50% with inf HK. A Multilink  . HLD (hyperlipidemia)   . COPD (chronic obstructive pulmonary disease)   . Tobacco abuse   . Bipolar disorder   . History of DVT (deep vein thrombosis)     traumatic, s/p coumadin tx.  . Glucose intolerance (impaired glucose tolerance)     A1c 6.2 06/2010  . Depression    AXIS IV:  housing problems, other psychosocial or environmental problems, problems related to social environment and problems with primary support group AXIS V:  11-20 some danger of hurting self or others possible OR occasionally fails to maintain minimal personal hygiene OR gross impairment in communication  Plan:  Recommend psychiatric Inpatient admission when medically cleared.  Subjective:   Anthony [name] is a 62 y.o. male Anthony [name] admitted with suicidal thinking and plan to overdose.  HPI:  Anthony [name] seen chart reviewed.  Anthony [name] is 62 year old Caucasian man who came to the emergency room with a feeling of severe depression and having suicidal thoughts.  Anthony [name] is noncompliant with his psychotropic medication trial.  Anthony [name] told the past few months he has been sleeping poorly, complaining of hopelessness and helplessness.  He has multiple physical problems including DVT.  He cannot walk and cannot work.  He is taking blood thinner.  For past few weeks he is thinking to take this blood thinner in his life.  He denies any hallucination or any paranoia but appropriate hopeless, helpless, anhedonia, lack of  energy and no desire to live.  He has limited family and social support.  He also like to restart his medication Wellbutrin which helped him in the past.  Anthony [name] is not drinking or using any illegal substances.  Anthony [name] has history of suicidal attempt in 1995 when he took overdose on his medication. HPI Elements:   Location:  Emergency room. Quality:  poor. Severity:  Moderate.  Past Psychiatric History: Past Medical History  Diagnosis Date  . CAD (coronary artery disease)     a. INF STEMI 07/04/10:  tx with thrombectomy + Vision BMS to [facility];  cath 07/04/10: dLM 10-20%, pLAD 40-50%, mLAD 20-30%, pRCA 30%, mRCA occluded and tx with PCI, EF 50% with inf HK. A Multilink  . HLD (hyperlipidemia)   . COPD (chronic obstructive pulmonary disease)   . Tobacco abuse   . Bipolar disorder   . History of DVT (deep vein thrombosis)     traumatic, s/p coumadin tx.  . Glucose intolerance (impaired glucose tolerance)     A1c 6.2 06/2010  . Depression     reports that he has been smoking Cigarettes.  He has been smoking about 2.00 packs per day. He uses smokeless tobacco. He reports that he does not drink alcohol. His drug history is not on file. Family History  Problem Relation Age of Onset  . Coronary artery disease Father            Allergies:   Allergies  Allergen Reactions  . Penicillins  Other (See Comments)    unknown reaction    ACT Assessment Complete:  Yes:    Educational Status    Risk to Self: Risk to self Is Anthony [name] at risk for suicide?: Yes Substance abuse history and/or treatment for substance abuse?: No  Risk to Others:    Abuse:    Prior Inpatient Therapy:    Prior Outpatient Therapy:    Additional Information:                    Objective: Blood pressure 101/73, pulse 86, temperature 98.4 F (36.9 C), temperature source Oral, resp. rate 18, height 5\' 10"  (1.778 m), weight 210 lb (95.255 kg), SpO2 93.00%.Body mass index is 30.13 kg/(m^2). Results for  orders placed during the hospital encounter of 12/13/12 (from the past 72 hour(s))  ACETAMINOPHEN LEVEL     Status: None   Collection Time    12/13/12  9:55 PM      Result Value Range   Acetaminophen (Tylenol), Serum <15.0  10 - 30 ug/mL   Comment:            THERAPEUTIC CONCENTRATIONS VARY     SIGNIFICANTLY. A RANGE OF 10-30     ug/mL MAY BE AN EFFECTIVE     CONCENTRATION FOR MANY PATIENTS.     HOWEVER, SOME ARE BEST TREATED     AT CONCENTRATIONS OUTSIDE THIS     RANGE.     ACETAMINOPHEN CONCENTRATIONS     >150 ug/mL AT 4 HOURS AFTER     INGESTION AND >50 ug/mL AT 12     HOURS AFTER INGESTION ARE     OFTEN ASSOCIATED WITH TOXIC     REACTIONS.  CBC     Status: Abnormal   Collection Time    12/13/12  9:55 PM      Result Value Range   WBC 9.3  4.0 - 10.5 K/uL   RBC 5.27  4.22 - 5.81 MIL/uL   Hemoglobin 16.8  13.0 - 17.0 g/dL   HCT 91.4  78.2 - 95.6 %   MCV 88.4  78.0 - 100.0 fL   MCH 31.9  26.0 - 34.0 pg   MCHC 36.1 (*) 30.0 - 36.0 g/dL   RDW 21.3  08.6 - 57.8 %   Platelets 207  150 - 400 K/uL  COMPREHENSIVE METABOLIC PANEL     Status: Abnormal   Collection Time    12/13/12  9:55 PM      Result Value Range   Sodium 133 (*) 135 - 145 mEq/L   Potassium 4.2  3.5 - 5.1 mEq/L   Chloride 97  96 - 112 mEq/L   CO2 23  19 - 32 mEq/L   Glucose, Bld 204 (*) 70 - 99 mg/dL   BUN 14  6 - 23 mg/dL   Creatinine, Ser 4.69  0.50 - 1.35 mg/dL   Calcium 9.6  8.4 - 62.9 mg/dL   Total Protein 7.7  6.0 - 8.3 g/dL   Albumin 4.1  3.5 - 5.2 g/dL   AST 70 (*) 0 - 37 U/L   ALT 85 (*) 0 - 53 U/L   Alkaline Phosphatase 73  39 - 117 U/L   Total Bilirubin 0.5  0.3 - 1.2 mg/dL   GFR calc non Af Amer 88 (*) >90 mL/min   GFR calc Af Amer >90  >90 mL/min   Comment: (NOTE)     The eGFR has been calculated using the CKD EPI equation.  This calculation has not been validated in all clinical situations.     eGFR's persistently <90 mL/min signify possible Chronic Kidney     Disease.  ETHANOL      Status: None   Collection Time    12/13/12  9:55 PM      Result Value Range   Alcohol, Ethyl (B) <11  0 - 11 mg/dL   Comment:            LOWEST DETECTABLE LIMIT FOR     SERUM ALCOHOL IS 11 mg/dL     FOR MEDICAL PURPOSES ONLY  SALICYLATE LEVEL     Status: Abnormal   Collection Time    12/13/12  9:55 PM      Result Value Range   Salicylate Lvl <2.0 (*) 2.8 - 20.0 mg/dL  POCT I-STAT TROPONIN I     Status: None   Collection Time    12/13/12 10:06 PM      Result Value Range   Troponin i, poc 0.00  0.00 - 0.08 ng/mL   Comment 3            Comment: Due to the release kinetics of cTnI,     a negative result within the first hours     of the onset of symptoms does not rule out     myocardial infarction with certainty.     If myocardial infarction is still suspected,     repeat the test at appropriate intervals.  URINE RAPID DRUG SCREEN (HOSP PERFORMED)     Status: None   Collection Time    12/13/12 10:21 PM      Result Value Range   Opiates NONE DETECTED  NONE DETECTED   Cocaine NONE DETECTED  NONE DETECTED   Benzodiazepines NONE DETECTED  NONE DETECTED   Amphetamines NONE DETECTED  NONE DETECTED   Tetrahydrocannabinol NONE DETECTED  NONE DETECTED   Barbiturates NONE DETECTED  NONE DETECTED   Comment:            DRUG SCREEN FOR MEDICAL PURPOSES     ONLY.  IF CONFIRMATION IS NEEDED     FOR ANY PURPOSE, NOTIFY LAB     WITHIN 5 DAYS.                LOWEST DETECTABLE LIMITS     FOR URINE DRUG SCREEN     Drug Class       Cutoff (ng/mL)     Amphetamine      1000     Barbiturate      200     Benzodiazepine   200     Tricyclics       300     Opiates          300     Cocaine          300     THC              50   Labs are reviewed.  Current Facility-Administered Medications  Medication Dose Route Frequency Provider Last Rate Last Dose  . aspirin EC tablet 81 mg  81 mg Oral q morning - 10a Suzi Roots, MD      . LORazepam (ATIVAN) tablet 1 mg  1 mg Oral Q8H PRN Suzi Roots, MD   1 mg at 12/14/12 0151  . metoprolol tartrate (LOPRESSOR) tablet 12.5 mg  12.5 mg Oral BID Suzi Roots, MD      . nicotine (  NICODERM CQ - dosed in mg/24 hours) patch 21 mg  21 mg Transdermal Daily Suzi Roots, MD      . Rivaroxaban Carlena Hurl) tablet 15 mg  15 mg Oral Daily Suzi Roots, MD      . traZODone (DESYREL) tablet 50 mg  50 mg Oral QHS PRN Kristeen Mans, NP   50 mg at 12/14/12 0151   Current Outpatient Prescriptions  Medication Sig Dispense Refill  . aspirin EC 81 MG tablet Take 81 mg by mouth every morning.       . Ginkgo Biloba 40 MG TABS Take 1 tablet by mouth daily.      Marland Kitchen lovastatin (MEVACOR) 40 MG tablet Take 40 mg by mouth at bedtime.      . metoprolol tartrate (LOPRESSOR) 25 MG tablet Take 12.5 mg by mouth 2 (two) times daily.      . naproxen sodium (ANAPROX) 220 MG tablet Take 220 mg by mouth 2 (two) times daily as needed (pain).      Marland Kitchen oxyCODONE-acetaminophen (PERCOCET) 5-325 MG per tablet Take 1 tablet by mouth every 6 (six) hours as needed for pain.  16 tablet  0  . Rivaroxaban (XARELTO) 15 MG TABS tablet Take 1 tablet (15 mg total) by mouth daily.  30 tablet  0  . vitamin B-12 (CYANOCOBALAMIN) 1000 MCG tablet Take 1,000 mcg by mouth daily.        Psychiatric Specialty Exam:     Blood pressure 101/73, pulse 86, temperature 98.4 F (36.9 C), temperature source Oral, resp. rate 18, height 5\' 10"  (1.778 m), weight 210 lb (95.255 kg), SpO2 93.00%.Body mass index is 30.13 kg/(m^2).  General Appearance: Disheveled and Guarded  Eye Solicitor::  Fair  Speech:  Slow  Volume:  Decreased  Mood:  Anxious, Depressed and Dysphoric  Affect:  Constricted and Depressed  Thought Process:  Intact  Orientation:  Full (Time, Place, and Person)  Thought Content:  Rumination  Suicidal Thoughts:  Yes.  with intent/plan  Homicidal Thoughts:  No  Memory:  Alert and oriented x3  Judgement:  Impaired  Insight:  Lacking  Psychomotor Activity:  Decreased  Concentration:   Fair  Recall:  Fair  Akathisia:  No  Handed:  Right  AIMS (if indicated):     Assets:  Desire for Improvement  Sleep:      Treatment Plan Summary: Medication management We will start Wellbutrin XL 150 mg daily.  Continue trazodone for insomnia.  Recommend inpatient psychiatric treatment when bed is available.  Zeki Bedrosian T. 12/14/2012 10:51 AM

## 2012-12-14 NOTE — BH Assessment (Signed)
[facility] Assessment Progress Note  Update:  [facility] - no beds per [facility] @ [address] - beds per [facility] @ 1117, so referral faxed for review [facility] - no beds per [facility] @ 1121

## 2012-12-14 NOTE — ED Provider Notes (Signed)
Pt sleeping, no issues overnight.  Awaiting placement.  Filed Vitals:   12/14/12 0549  BP: 107/66  Pulse: 75  Temp: 98.4 F (36.9 C)  Resp: 18     Rolan Bucco, MD 12/14/12 [phone]

## 2012-12-14 NOTE — ED Notes (Signed)
NAD, sitting up in room eating lunch

## 2012-12-14 NOTE — Progress Notes (Signed)
The following facilities have been contacted regarding inpatient placement:  [facility]: spoke with Broadus John, asked that the referral be faxed [facility]: spoke with Geri Seminole, stated beds are available and asked that the referral be faxed  Referral has been faxed to both of the facilities named above.  Tomi Bamberger, MHT

## 2012-12-14 NOTE — ED Notes (Signed)
Dr Fredderick Phenix  Informed that lopressor was held due to BP this AM, continue to monitor BP

## 2012-12-14 NOTE — ED Notes (Signed)
Shuvon NP and Dr Lolly Mustache into see

## 2012-12-15 ENCOUNTER — Emergency Department (HOSPITAL_COMMUNITY): Payer: Self-pay

## 2012-12-15 DIAGNOSIS — F319 Bipolar disorder, unspecified: Secondary | ICD-10-CM

## 2012-12-15 MED ORDER — FLUOXETINE HCL 10 MG PO CAPS
10.0000 mg | ORAL_CAPSULE | Freq: Every day | ORAL | Status: DC
  Administered 2012-12-15 – 2012-12-16 (×2): 10 mg via ORAL
  Filled 2012-12-15 (×2): qty 1

## 2012-12-15 NOTE — Progress Notes (Signed)
Pt was interviewed with NP. Agree with assessment and plan. Will admit for safety and stabilization. Start prozac for depression.

## 2012-12-15 NOTE — Progress Notes (Signed)
Patient ID: Anthony [name], male   DOB: 17-Nov-1950, 62 y.o.   MRN: [id number] Psychiatric Specialty Exam: Physical Exam  ROS  Blood pressure 121/81, pulse 74, temperature 97.5 F (36.4 C), temperature source Oral, resp. rate 18, height 5\' 10"  (1.778 m), weight 95.255 kg (210 lb), SpO2 93.00%.Body mass index is 30.13 kg/(m^2).  General Appearance: Casual and Disheveled  Eye Contact::  Good  Speech:  Clear and Coherent and Normal Rate  Volume:  Normal  Mood:  Depressed  Affect:  Congruent and Flat, depressed  Thought Process:  Coherent and Intact  Orientation:  Full (Time, Place, and Person)  Thought Content:  NA  Suicidal Thoughts:  No  Homicidal Thoughts:  No  Memory:  Immediate;   Good Recent;   Fair Remote;   Fair  Judgement:  Poor  Insight:  Present  Psychomotor Activity:  Normal  Concentration:  Fair  Recall:  NA  Akathisia:  NA  Handed:  Right  AIMS (if indicated):     Assets:  Desire for Improvement  Sleep:      Pt was seen this am by this Clinical research associate and Dr Tawni Carnes.  He denies Suicidal ideation and credits his feeling well to good night sleep.  Patient states he lives in a shade at am empty lot with heating but no water.  He reports previous suicidal attempt in 1995 by OD on sleeping pills. He did not report to anybody and did not seek treatment.  He started seeing Psychiatrist back in 2008 and was tried on several medications.  He states   The only medication that worked for him was Xanax and after a while his doctor stopped ordering it for him.  This Clinical research associate and Dr Tawni Carnes told patient we will not restart his Xanax but will restart him on an Antidepressant.  Patient now denies SI/HI/AVH but stated he was thinking of OD on Xarelto or cutting his wrist.  He has agreed to come in for treatment of his depression and we will be seeking for bed in our 500 hall unit.  We will start him on Prozac 10 mg po while he waits for available bed. Plan: Admit as planned Start Prozac 10 mg po  daily.  Dahlia Byes   PMHNP-BC

## 2012-12-15 NOTE — Progress Notes (Signed)
Underwriter initiated bed placement for inpatient hospitalization for pt.  The following facilities were faxed based on bed availability: 1)[facility] 2)[facility]  [facility] declined pt due to DVT/blood thinner in addition to heart condition per Christiane Ha.  Blain Pais, MHT/NS

## 2012-12-16 ENCOUNTER — Inpatient Hospital Stay (HOSPITAL_COMMUNITY)
Admission: AD | Admit: 2012-12-16 | Discharge: 2012-12-22 | DRG: 885 | Disposition: A | Discharge status: Home / Self Care | Payer: 59 | Source: Intra-hospital | Attending: Psychiatry | Admitting: Psychiatry

## 2012-12-16 ENCOUNTER — Encounter (HOSPITAL_COMMUNITY): Payer: Self-pay | Admitting: *Deleted

## 2012-12-16 DIAGNOSIS — F332 Major depressive disorder, recurrent severe without psychotic features: Principal | ICD-10-CM | POA: Diagnosis present

## 2012-12-16 DIAGNOSIS — E785 Hyperlipidemia, unspecified: Secondary | ICD-10-CM

## 2012-12-16 DIAGNOSIS — F17200 Nicotine dependence, unspecified, uncomplicated: Secondary | ICD-10-CM | POA: Diagnosis present

## 2012-12-16 DIAGNOSIS — R45851 Suicidal ideations: Secondary | ICD-10-CM

## 2012-12-16 DIAGNOSIS — Z5181 Encounter for therapeutic drug level monitoring: Secondary | ICD-10-CM

## 2012-12-16 DIAGNOSIS — J4489 Other specified chronic obstructive pulmonary disease: Secondary | ICD-10-CM | POA: Diagnosis present

## 2012-12-16 DIAGNOSIS — Z79899 Other long term (current) drug therapy: Secondary | ICD-10-CM

## 2012-12-16 DIAGNOSIS — J449 Chronic obstructive pulmonary disease, unspecified: Secondary | ICD-10-CM | POA: Diagnosis present

## 2012-12-16 DIAGNOSIS — F319 Bipolar disorder, unspecified: Secondary | ICD-10-CM

## 2012-12-16 DIAGNOSIS — Z720 Tobacco use: Secondary | ICD-10-CM

## 2012-12-16 DIAGNOSIS — F329 Major depressive disorder, single episode, unspecified: Secondary | ICD-10-CM

## 2012-12-16 DIAGNOSIS — F333 Major depressive disorder, recurrent, severe with psychotic symptoms: Secondary | ICD-10-CM | POA: Diagnosis present

## 2012-12-16 DIAGNOSIS — I2510 Atherosclerotic heart disease of native coronary artery without angina pectoris: Secondary | ICD-10-CM | POA: Diagnosis present

## 2012-12-16 LAB — URINALYSIS, ROUTINE W REFLEX MICROSCOPIC
Bilirubin Urine: NEGATIVE
Nitrite: NEGATIVE
Specific Gravity, Urine: 1.016 (ref 1.005–1.030)
Urobilinogen, UA: 0.2 mg/dL (ref 0.0–1.0)

## 2012-12-16 LAB — GLUCOSE, CAPILLARY: Glucose-Capillary: 193 mg/dL — ABNORMAL HIGH (ref 70–99)

## 2012-12-16 MED ORDER — NICOTINE 21 MG/24HR TD PT24
21.0000 mg | MEDICATED_PATCH | Freq: Every day | TRANSDERMAL | Status: DC
  Administered 2012-12-17 – 2012-12-22 (×6): 21 mg via TRANSDERMAL
  Filled 2012-12-16 (×6): qty 1
  Filled 2012-12-16: qty 14
  Filled 2012-12-16 (×2): qty 1

## 2012-12-16 MED ORDER — MAGNESIUM HYDROXIDE 400 MG/5ML PO SUSP
30.0000 mL | Freq: Every day | ORAL | Status: DC | PRN
  Administered 2012-12-18 – 2012-12-19 (×2): 30 mL via ORAL

## 2012-12-16 MED ORDER — ALUM & MAG HYDROXIDE-SIMETH 200-200-20 MG/5ML PO SUSP: 30.0000 mL | ORAL | Status: DC | PRN

## 2012-12-16 MED ORDER — IBUPROFEN 200 MG PO TABS
600.0000 mg | ORAL_TABLET | Freq: Three times a day (TID) | ORAL | Status: DC | PRN
  Administered 2012-12-16: 600 mg via ORAL
  Filled 2012-12-16: qty 3

## 2012-12-16 MED ORDER — TRAZODONE HCL 50 MG PO TABS
50.0000 mg | ORAL_TABLET | Freq: Every evening | ORAL | Status: DC | PRN
  Administered 2012-12-16 – 2012-12-17 (×4): 50 mg via ORAL
  Filled 2012-12-16 (×10): qty 1

## 2012-12-16 MED ORDER — RIVAROXABAN 20 MG PO TABS
20.0000 mg | ORAL_TABLET | Freq: Every day | ORAL | Status: DC
  Administered 2012-12-16 – 2012-12-21 (×6): 20 mg via ORAL
  Filled 2012-12-16 (×9): qty 1

## 2012-12-16 MED ORDER — METOPROLOL TARTRATE 12.5 MG HALF TABLET
12.5000 mg | ORAL_TABLET | Freq: Two times a day (BID) | ORAL | Status: DC
  Administered 2012-12-16 – 2012-12-17 (×2): 12.5 mg via ORAL
  Administered 2012-12-17: 09:00:00 via ORAL
  Administered 2012-12-18 – 2012-12-19 (×3): 12.5 mg via ORAL
  Administered 2012-12-19: 17:00:00 via ORAL
  Administered 2012-12-20 – 2012-12-22 (×5): 12.5 mg via ORAL
  Filled 2012-12-16 (×17): qty 1

## 2012-12-16 MED ORDER — ASPIRIN EC 81 MG PO TBEC
81.0000 mg | DELAYED_RELEASE_TABLET | Freq: Every day | ORAL | Status: DC
  Administered 2012-12-17 – 2012-12-22 (×6): 81 mg via ORAL
  Filled 2012-12-16 (×7): qty 1

## 2012-12-16 MED ORDER — ACETAMINOPHEN 500 MG PO TABS: 1000.0000 mg | ORAL_TABLET | Freq: Four times a day (QID) | ORAL | Status: DC | PRN

## 2012-12-16 MED ORDER — ACETAMINOPHEN 500 MG PO TABS
1000.0000 mg | ORAL_TABLET | Freq: Four times a day (QID) | ORAL | Status: DC | PRN
  Administered 2012-12-16 – 2012-12-22 (×13): 1000 mg via ORAL
  Filled 2012-12-16 (×13): qty 2

## 2012-12-16 MED ORDER — ACETAMINOPHEN 325 MG PO TABS: 650.0000 mg | ORAL_TABLET | Freq: Four times a day (QID) | ORAL | Status: DC | PRN

## 2012-12-16 MED ORDER — WHITE PETROLATUM GEL
Status: DC | PRN
  Filled 2012-12-16 (×2): qty 5

## 2012-12-16 MED ORDER — FLUOXETINE HCL 10 MG PO CAPS
10.0000 mg | ORAL_CAPSULE | Freq: Every day | ORAL | Status: DC
  Administered 2012-12-17: 10 mg via ORAL
  Filled 2012-12-16 (×2): qty 1

## 2012-12-16 NOTE — Progress Notes (Signed)
[name], MHT received UA results and submitted to Bonita Quin, RN intake at [facility] for review. Bonita Quin was informed that patient will be a voluntary admission and to fax voluntary admission forms to [facility], attending nurse at this time [place] notified of this. Writer will have oncoming disposition tech to follow up.

## 2012-12-16 NOTE — Consult Note (Signed)
  Psychiatric Specialty Exam: Physical Exam  ROS  Blood pressure 119/69, pulse 104, temperature 97.7 F (36.5 C), temperature source Oral, resp. rate 20, height 5\' 10"  (1.778 m), weight 95.255 kg (210 lb), SpO2 98.00%.Body mass index is 30.13 kg/(m^2).  General Appearance: Casual  Eye Contact::  Good  Speech:  Clear and Coherent  Volume:  Normal  Mood:  Depressed  Affect:  Appropriate  Thought Process:  Goal Directed  Orientation:  Full (Time, Place, and Person)  Thought Content:  Negative  Suicidal Thoughts:  Yes.  with intent/plan  Homicidal Thoughts:  No  Memory:  Immediate;   Good Recent;   Good Remote;   Good  Judgement:  Intact  Insight:  Fair  Psychomotor Activity:  Normal  Concentration:  Good  Recall:  Good  Akathisia:  Negative  Handed:  Right  AIMS (if indicated):     Assets:  Communication Skills Desire for Improvement  Sleep:   poor even with meds  Mr Jameis is still depressed and suicidal.  Says his physical health is so bad that he has no quality of life.  To even take care of his house tires him.  He describes himself as a "slovenly sloth".  He also worries about a hydrocoele that is getting larger.  Has become depressed and hopeless.  He will be transferred to [facility] today.

## 2012-12-16 NOTE — Progress Notes (Signed)
D: Pt was having frequent thoughts of suicide with a plan to OD.Pt states that his main stressor is lack of sleep. Passive SI but contracts for safety on the unit. Denies HI & AVH. A: Pt supported & encouraged. Pt on 15 minute checks. R: Pt. Safety maintained.

## 2012-12-16 NOTE — ED Notes (Signed)
Report called Beverly,RN at [facility].  Pt. To go to [facility] at 1330.

## 2012-12-16 NOTE — Progress Notes (Signed)
The Clinical research associate was requested by Annabelle Harman, Intake nurse, from [place] to send the results of the urinalysis. The writer received verbal confirmation that she received the fax. Annabelle Harman from [place] stated she will also, send the voluntary consent support forms to the [facility]. The Clinical research associate informed the counselor Berna Spare at the [facility] that they should receive the forms. -T.Damain Broadus, MHT

## 2012-12-16 NOTE — ED Notes (Signed)
Notified Shavon, NP of [facility] of 193.  Pt. Denies being DM.  Per NP, continue to monitor pt.'s bloodsugar.  Discussed with pt. DM, pt. States that he will "see someone" about this.

## 2012-12-16 NOTE — Progress Notes (Signed)
Anthony [name], MHT continued placement search efforts by contacting the facilities noted below;   [facility] at capacity [facility] not available [facility] not available [facility] faxed referral for review [facility] not available [facility] per [employer] patient must be IVC to present to attending [name] faxed for review

## 2012-12-16 NOTE — Progress Notes (Signed)
Ava from the ACT team informed the writer that she has not received the support forms from [place]. Per request from Ava, the writer followed-up with Annabelle Harman, intake nurse, from [place]. Annabelle Harman stated that she will look for the forms and call the writer back once she is able to locate them. Writer provided contact number to [employer]. Writer will follow-up with Annabelle Harman from [place] if a call has not been returned by 1300. -T.Adriana Simas, MHT

## 2012-12-16 NOTE — Progress Notes (Signed)
B.Shanelle Clontz, MHT received report from Bonita Quin, RN at [facility] who is reviewing patient for admission and has requested a urine analysis. Writer contacted attending nurse Latricia  at [facility] and notified of request. If patient is accepted he will need to complete voluntary admission forms for [facility] which will be faxed to [facility] for him to sign. Writer will follow up this am when results are back or notify oncoming disposition tech if result are not back by 7am this morning.

## 2012-12-16 NOTE — ED Notes (Signed)
Pt. To go to [facility] around 1330.

## 2012-12-16 NOTE — Progress Notes (Signed)
Per Annabelle Harman, intake nurse, from [place] the patient has been denied based on the patient needing long term care. The writer informed Ava in the psych-ed and the Clinical research associate will search for other facilities for placement. -T.Anyeli Hockenbury, MHT

## 2012-12-16 NOTE — Progress Notes (Signed)
Writer consulted with the Psychiatrist (Dr. Ladona Ridgel) and the NP [facility]) regarding the patient meeting criteria for inpatient hospitalization.   Writer informed the ER MD (Dr. Dawna Part) and the nurse Candise Bowens) that the patient has been accepted to [facility] Bed 502-2. The accepting is Dr. Elsie Saas.  Patient will be sent to [facility] at 1:30pm.  Writer has faxed the support paperwork to [facility]

## 2012-12-16 NOTE — Progress Notes (Signed)
Adult Psychoeducational Group Note  Date:  12/16/2012 Time:  8:00 pm  Group Topic/Focus:  Wrap-Up Group:   The focus of this group is to help patients review their daily goal of treatment and discuss progress on daily workbooks.  Participation Level:  Active  Participation Quality:  Appropriate and Sharing  Affect:  Appropriate  Cognitive:  Appropriate  Insight: Appropriate  Engagement in Group:  Engaged  Modes of Intervention:  Discussion, Education, Socialization and Support  Additional Comments: Pt stated that he is an alcoholic and tried to commit suicide. Pt stated that he is feeling better since being in the hospital. Pt stated that he has a sense of humor and that he is optimistic.   Laural Benes, Deondray Ospina 12/16/2012, 11:29 PM

## 2012-12-17 DIAGNOSIS — F333 Major depressive disorder, recurrent, severe with psychotic symptoms: Secondary | ICD-10-CM

## 2012-12-17 DIAGNOSIS — R45851 Suicidal ideations: Secondary | ICD-10-CM

## 2012-12-17 MED ORDER — FLUOXETINE HCL 20 MG PO CAPS
20.0000 mg | ORAL_CAPSULE | Freq: Every day | ORAL | Status: DC
  Administered 2012-12-18 – 2012-12-19 (×2): 20 mg via ORAL
  Filled 2012-12-17 (×6): qty 1

## 2012-12-17 NOTE — Progress Notes (Signed)
[facility] Group Notes:  (Nursing/MHT/Case Management/Adjunct)  Date:  12/17/2012  Time:  8:39 PM  Type of Therapy:  Group Therapy  Participation Level:  Active  Participation Quality:  Appropriate  Affect:  Appropriate  Cognitive:  Appropriate  Insight:  Appropriate  Engagement in Group:  Engaged  Modes of Intervention:  Discussion  Summary of Progress/Problems:The patient said he had  A ok day.The patient said that he kept finding hisself in a depress way. The patient said that he tries to occupy his mind with other thought to feel better.  Anthony [name] 12/17/2012, 8:39 PM

## 2012-12-17 NOTE — Tx Team (Signed)
Interdisciplinary Treatment Plan Update (Adult)  Date: 12/17/2012  Time Reviewed:  9:45 AM  Progress in Treatment: Attending groups: Yes Participating in groups:  Yes Taking medication as prescribed:  Yes Tolerating medication:  Yes Family/Significant othe contact made: CSW assessing  Patient understands diagnosis:  Yes Discussing patient identified problems/goals with staff:  Yes Medical problems stabilized or resolved:  Yes Denies suicidal/homicidal ideation: Yes Issues/concerns per patient self-inventory:  Yes Other:  New problem(s) identified: N/A  Discharge Plan or Barriers: CSW assessing for appropriate referrals.  Reason for Continuation of Hospitalization: Anxiety Depression Medication Stabilization  Comments: N/A  Estimated length of stay: 3-5 days  For review of initial/current patient goals, please see plan of care.  Attendees: Patient:     Family:     Physician:  Dr. Johnalagadda 12/17/2012 10:33 AM   Nursing:   Vivian Kent, RN 12/17/2012 10:33 AM   Clinical Social Worker:  Korin Hartwell [name], LCSW 12/17/2012 10:33 AM   Other: Christa Dopson, RN 12/17/2012 10:33 AM   Other:  Roxanne Brand, care coordination 12/17/2012 10:33 AM   Other:  Quylle Hodnett, LCSW 12/17/2012 10:33 AM   Other:  Roniecia Byrd, RN 12/17/2012 10:33 AM   Other:    Other:    Other:    Other:    Other:    Other:     Scribe for Treatment Team:   [name], Jewelle Whitner Nicole, 12/17/2012 10:33 AM   

## 2012-12-17 NOTE — Progress Notes (Signed)
Recreation Therapy Notes  Date: 12.10.2014 Time: 3:00pm Location: [address] Dayroom  Group Topic: Leisure Education  Goal Area(s) Addresses:  Patient will identify positive leisure activities.  Patient will identify one positive benefit of participation in leisure activities.   Behavioral Response:  Appropriate  Intervention: Game  Activity: Group Leisure ABC's. Patients were split into teams of 4, as a team they were asked to identify leisure activities to correspond with each letter of the alphabet. Patient lists were combined to make large group list.  Education:  Leisure Education, Pharmacologist, Building control surveyor.   Education Outcome: Acknowledges understanding  Clinical Observations/Feedback: Patient actively engaged in group activity working well with her teammates to identify leisure activities to correspond with letters of the alphabet. Patient made no contributions to group discussion, but appeared to actively listen as he maintained appropriate eye contact with speaker.    Marykay Lex Myli Pae, LRT/CTRS  Jearl Klinefelter 12/17/2012 3:56 PM

## 2012-12-17 NOTE — BHH Counselor (Signed)
Adult Comprehensive Assessment  Patient ID: Anthony [name], male   DOB: 1950-08-28, 62 y.o.   MRN: [id number]  Information Source: Information source: Patient  Current Stressors:  Educational / Learning stressors: N/A Employment / Job issues: Unemployed, not stressor for him though Family Relationships: N/A Surveyor, quantity / Lack of resources (include bankruptcy): No income, lives off his assests, not a stressor for him Housing / Lack of housing: N/A Physical health (include injuries & life threatening diseases): has something growing in testicular sac. Social relationships: N/A Substance abuse: N/A Bereavement / Loss: N/A  Living/Environment/Situation:  Living Arrangements: Alone Living conditions (as described by patient or guardian): Pt lives alone in [place].  Pt reports this is an okay environment but states it's a "shack" and hasn't been caring for the place.   How long has patient lived in current situation?: 6 years What is atmosphere in current home: Other (Comment) (isolated, lonely)  Family History:  Marital status: Divorced Divorced, when?: divorced twice, 2002 What types of issues is patient dealing with in the relationship?: went seperate ways Additional relationship information: N/A Does patient have children?: Yes How many children?: 2 How is patient's relationship with their children?: Pt reports having a good relationship with daughters.    Childhood History:  By whom was/is the patient raised?: Both parents Additional childhood history information: Pt reports having a good childhood.  Pt states that he was raised in [place].   Description of patient's relationship with caregiver when they were a child: Pt reports having a good relationship with both parents.  Patient's description of current relationship with people who raised him/her: Both parents are deceased.  Does patient have siblings?: Yes Number of Siblings: 1 Description of patient's current relationship with  siblings: Pt reports having a good relationship with sister.   Did patient suffer any verbal/emotional/physical/sexual abuse as a child?: No Did patient suffer from severe childhood neglect?: No Has patient ever been sexually abused/assaulted/raped as an adolescent or adult?: No Was the patient ever a victim of a crime or a disaster?: No Witnessed domestic violence?: No Has patient been effected by domestic violence as an adult?: No  Education:  Highest grade of school patient has completed: some trade school Currently a student?: No Name of school: N/A Learning disability?: No  Employment/Work Situation:   Employment situation: Unemployed Patient's job has been impacted by current illness: No What is the longest time patient has a held a job?: 12 years 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, making clothes Has patient ever been in the [employer]?: Yes (Describe in comment) (Educational psychologist for 6 months) Has patient ever served in Buyer, retail?: No  Financial Resources:   Surveyor, quantity resources: Income from spouse;Food stamps (living off of his assests) Does patient have a representative payee or guardian?: No  Alcohol/Substance Abuse:   What has been your use of drugs/alcohol within the last 12 months?: Pt denies alcohol and drug abuse If attempted suicide, did drugs/alcohol play a role in this?: No Alcohol/Substance Abuse Treatment Hx: Past Tx, Inpatient If yes, describe treatment: Pt reports having to complete a court ordered treatment in 1984 and not having a problem with alcohol since.   Has alcohol/substance abuse ever caused legal problems?: No  Social Support System:   Patient's Community Support System: Good Describe Community Support System: Pt reports that his family is very supportive.  Type of faith/religion: Ephriam Knuckles How does patient's faith help to cope with current illness?: prayer  Leisure/Recreation:   Leisure and  Hobbies: pt denies having any hobbies,  stating he's been depressed and doesn't feel like doing anything.   Strengths/Needs:   What things does the patient do well?: personal relations, fixing things In what areas does patient struggle / problems for patient: Depression, SI  Discharge Plan:   Does patient have access to transportation?: Yes Will patient be returning to same living situation after discharge?: Yes Currently receiving community mental health services: Yes (From Whom) Arna Medici) If no, would patient like referral for services when discharged?: Yes (What county?) [facility]) Does patient have financial barriers related to discharge medications?: No  Summary/Recommendations:     Patient is a 62 year old Caucasian Male with a diagnosis of Major Depressive Disorder.  Patient lives in [place] alone.  Pt reports feeling depressed for the last year with no parcipitating factors.  Patient will benefit from crisis stabilization, medication evaluation, group therapy and psycho education in addition to case management for discharge planning.    Anthony [name], [name]. 12/17/2012

## 2012-12-17 NOTE — H&P (Signed)
Psychiatric Admission Assessment Adult  Patient Identification:  Anthony [name] Date of Evaluation:  12/17/2012 Chief Complaint:  MAJOR DEPRESSIVE DISORDER History of Present Illness: Patient is a 62 year old while male [facility] for symptoms of depression and suicidal ideation with the plan to overdose on his blood thinner medication and aspirin or by cutting his ankles and wrists. Patient reports that he is not able to contract for safety. Patient has a history of suicidal attempt in 1995 when he took an overdose on sleeping pills. Patient has increased feelings of hopelessness and depression. Patient reports a previous diagnosis of Bipolar and Major Depressive Disorder, Recurrent. Patient denies prior psychiatric hospitalization. Patient reports medication management previously at [facility]. Patient reports that he has not been taking his medication as prescribed. Patient was not able to tell me the last time that he has taken his medication. Patient denies substance abuse.   Elements:  Location:  Psychiatric inpatient unit. Quality:  Depression. Severity:  Suicidal ideation. Timing:  Multiple psychosocial stressors. Duration:  Few months. Context:  Suicide plan and intervention. Associated Signs/Synptoms: Depression Symptoms:  depressed mood, anhedonia, hypersomnia, psychomotor retardation, fatigue, feelings of worthlessness/guilt, difficulty concentrating, hopelessness, impaired memory, suicidal thoughts with specific plan, anxiety, weight gain, increased appetite, (Hypo) Manic Symptoms:  Distractibility, Impulsivity, Anxiety Symptoms:  Excessive Worry, Psychotic Symptoms:  Not applicable PTSD Symptoms: NA  Psychiatric Specialty Exam: Physical Exam  ROS  Blood pressure 126/88, pulse 78, temperature 97.4 F (36.3 C), temperature source Oral, resp. rate 16, height 5\' 10"  (1.778 m), SpO2 93.00%.There is no weight  on file to calculate BMI.  General Appearance: Disheveled and Guarded  Patent attorney::  Fair  Speech:  Clear and Coherent and Slow  Volume:  Decreased  Mood:  Anxious, Depressed, Hopeless and Worthless  Affect:  Depressed and Flat  Thought Process:  Goal Directed and Intact  Orientation:  Full (Time, Place, and Person)  Thought Content:  WDL  Suicidal Thoughts:  Yes.  with intent/plan  Homicidal Thoughts:  No  Memory:  Immediate;   Fair  Judgement:  Impaired  Insight:  Lacking  Psychomotor Activity:  Psychomotor Retardation  Concentration:  Fair  Recall:  Fair  Akathisia:  NA  Handed:  Right  AIMS (if indicated):     Assets:  Communication Skills Desire for Improvement Leisure Time Physical Health Resilience Social Support Transportation  Sleep:  Number of Hours: 4.25    Past Psychiatric History: Diagnosis:  Hospitalizations:  Outpatient Care:  Substance Abuse Care:  Self-Mutilation:  Suicidal Attempts:  Violent Behaviors:   Past Medical History:   Past Medical History  Diagnosis Date  . CAD (coronary artery disease)     a. INF STEMI 07/04/10:  tx with thrombectomy + [facility] BMS to [facility];  cath 07/04/10: dLM 10-20%, pLAD 40-50%, mLAD 20-30%, pRCA 30%, mRCA occluded and tx with PCI, EF 50% with inf HK. A Multilink  . HLD (hyperlipidemia)   . COPD (chronic obstructive pulmonary disease)   . Tobacco abuse   . Bipolar disorder   . History of DVT (deep vein thrombosis)     traumatic, s/p coumadin tx.  . Glucose intolerance (impaired glucose tolerance)     A1c 6.2 06/2010  . Depression    None. Allergies:   Allergies  Allergen Reactions  . Penicillins Other (See Comments)    unknown reaction   PTA Medications: Prescriptions prior to admission  Medication Sig Dispense Refill  . aspirin EC  81 MG tablet Take 81 mg by mouth every morning.       . lovastatin (MEVACOR) 40 MG tablet Take 40 mg by mouth at bedtime.      . metoprolol tartrate (LOPRESSOR) 25 MG tablet Take  12.5 mg by mouth 2 (two) times daily.      . naproxen sodium (ANAPROX) 220 MG tablet Take 220 mg by mouth 2 (two) times daily as needed (pain).      . Rivaroxaban (XARELTO) 20 MG TABS tablet Take 20 mg by mouth daily with supper.      . vitamin B-12 (CYANOCOBALAMIN) 1000 MCG tablet Take 1,000 mcg by mouth daily.        Previous Psychotropic Medications:  Medication/Dose                 Substance Abuse History in the last 12 months:  no  Consequences of Substance Abuse: NA  Social History:  reports that he has been smoking Cigarettes.  He has been smoking about 2.00 packs per day. He uses smokeless tobacco. He reports that he does not drink alcohol. His drug history is not on file. Additional Social History:                      Current Place of Residence:   Place of Birth:   Family Members: Marital Status:  Divorced Children:  Sons:  Daughters: Relationships: Education:  [employer] Problems/Performance: Religious Beliefs/Practices: History of Abuse (Emotional/Phsycial/Sexual) Teacher, music History:  None. Legal History: Hobbies/Interests:  Family History:   Family History  Problem Relation Age of Onset  . Coronary artery disease Father     Results for orders placed during the hospital encounter of 12/13/12 (from the past 72 hour(s))  URINALYSIS, ROUTINE W REFLEX MICROSCOPIC     Status: Abnormal   Collection Time    12/16/12  6:14 AM      Result Value Range   Color, Urine YELLOW  YELLOW   APPearance CLEAR  CLEAR   Specific Gravity, Urine 1.016  1.005 - 1.030   pH 5.5  5.0 - 8.0   Glucose, UA 100 (*) NEGATIVE mg/dL   Hgb urine dipstick NEGATIVE  NEGATIVE   Bilirubin Urine NEGATIVE  NEGATIVE   Ketones, ur NEGATIVE  NEGATIVE mg/dL   Protein, ur NEGATIVE  NEGATIVE mg/dL   Urobilinogen, UA 0.2  0.0 - 1.0 mg/dL   Nitrite NEGATIVE  NEGATIVE   Leukocytes, UA NEGATIVE  NEGATIVE   Comment: MICROSCOPIC NOT DONE ON  URINES WITH NEGATIVE PROTEIN, BLOOD, LEUKOCYTES, NITRITE, OR GLUCOSE <1000 mg/dL.  GLUCOSE, CAPILLARY     Status: Abnormal   Collection Time    12/16/12 12:08 PM      Result Value Range   Glucose-Capillary 193 (*) 70 - 99 mg/dL   Psychological Evaluations:  Assessment:   DSM5:  Schizophrenia Disorders:   Obsessive-Compulsive Disorders:   Trauma-Stressor Disorders:   Substance/Addictive Disorders:   Depressive Disorders:  Major Depressive Disorder - Severe (296.23)  AXIS I:  Major Depression, Recurrent severe AXIS II:  Deferred AXIS III:   Past Medical History  Diagnosis Date  . CAD (coronary artery disease)     a. INF STEMI 07/04/10:  tx with thrombectomy + Vision BMS to [facility];  cath 07/04/10: dLM 10-20%, pLAD 40-50%, mLAD 20-30%, pRCA 30%, mRCA occluded and tx with PCI, EF 50% with inf HK. A Multilink  . HLD (hyperlipidemia)   . COPD (chronic obstructive pulmonary disease)   .  Tobacco abuse   . Bipolar disorder   . History of DVT (deep vein thrombosis)     traumatic, s/p coumadin tx.  . Glucose intolerance (impaired glucose tolerance)     A1c 6.2 06/2010  . Depression    AXIS IV:  economic problems, housing problems, occupational problems, other psychosocial or environmental problems, problems related to social environment and problems with primary support group AXIS V:  41-50 serious symptoms  Treatment Plan/Recommendations:  Admit for crisis evaluation, safety margin and medication management  Treatment Plan Summary: Daily contact with patient to assess and evaluate symptoms and progress in treatment Medication management Current Medications:  Current Facility-Administered Medications  Medication Dose Route Frequency Provider Last Rate Last Dose  . acetaminophen (TYLENOL) tablet 1,000 mg  1,000 mg Oral Q6H PRN Benjaman Pott, MD   1,000 mg at 12/17/12 4098  . alum & mag hydroxide-simeth (MAALOX/MYLANTA) 200-200-20 MG/5ML suspension 30 mL  30 mL Oral Q4H PRN Benjaman Pott, MD      . aspirin EC tablet 81 mg  81 mg Oral Q lunch Benjaman Pott, MD   81 mg at 12/17/12 1200  . [START ON 12/18/2012] FLUoxetine (PROZAC) capsule 20 mg  20 mg Oral Daily Nehemiah Settle, MD      . magnesium hydroxide (MILK OF MAGNESIA) suspension 30 mL  30 mL Oral Daily PRN Benjaman Pott, MD      . metoprolol tartrate (LOPRESSOR) tablet 12.5 mg  12.5 mg Oral BID Benjaman Pott, MD      . nicotine (NICODERM CQ - dosed in mg/24 hours) patch 21 mg  21 mg Transdermal Daily Benjaman Pott, MD   21 mg at 12/17/12 0907  . Rivaroxaban (XARELTO) tablet 20 mg  20 mg Oral Q supper Benjaman Pott, MD   20 mg at 12/16/12 1849  . traZODone (DESYREL) tablet 50 mg  50 mg Oral QHS,MR X 1 Kerry Hough, PA-C   50 mg at 12/16/12 2331    Observation Level/Precautions:  15 minute checks  Laboratory:  Reviewed admission labs  Psychotherapy: Individual, group and milieu therapy patient benefit from supportive therapy and interpersonal psychotherapy and motivational interviewing   Medications:  Increase fluoxetine to 20 mg daily for depression and continue trazodone 50 mg at bedtime and home medications   Consultations:  None   Discharge Concerns: Safety   Estimated LOS: 4-7 days   Other:     I certify that inpatient services furnished can reasonably be expected to improve the patient's condition.   Starlene Consuegra,JANARDHAHA R. 12/10/201412:40 PM

## 2012-12-17 NOTE — [facility] Group Notes (Signed)
[facility] LCSW Aftercare Discharge Planning Group Note   12/17/2012 8:45 AM  Participation Quality:  Alert, Appropriate and Oriented  Mood/Affect:  Flat and Depressed  Depression Rating:  4  Anxiety Rating:  5  Thoughts of Suicide:  Pt denies SI/HI  Will you contract for safety?   Yes  Current AVH:  Pt denies  Plan for Discharge/Comments:  Pt attended discharge planning group and actively participated in group.  CSW provided pt with today's workbook.  Pt reports coming to the hospital for suicidal ideation and anxiety.  Pt reports low energy today.  Pt states that he lives in [place] and will return home there.  CSW will assess for appropriate referrals.  No further needs voiced by pt at this time.    Transportation Means: Pt reports access to transportation - pt has own car here  Supports: No supports mentioned at this time  Reyes Ivan, LCSW 12/17/2012 10:21 AM

## 2012-12-17 NOTE — [facility] Group Notes (Signed)
[facility] LCSW Group Therapy  12/17/2012  1:15 PM   Type of Therapy:  Group Therapy  Participation Level:  Active  Participation Quality:  Attentive, Sharing and Supportive  Affect:  Depressed and Flat  Cognitive:  Alert and Oriented  Insight:  Developing/Improving and Engaged  Engagement in Therapy:  Developing/Improving and Engaged  Modes of Intervention:  Clarification, Confrontation, Discussion, Education, Exploration, Limit-setting, Orientation, Problem-solving, Rapport Building, Dance movement psychotherapist, Socialization and Support  Summary of Progress/Problems: The topic for group today was emotional regulation.  This group focused on both positive and negative emotion identification and allowed group members to process ways to identify feelings, regulate negative emotions, and find healthy ways to manage internal/external emotions. Group members were asked to reflect on a time when their reaction to an emotion led to a negative outcome and explored how alternative responses using emotion regulation would have benefited them. Group members were also asked to discuss a time when emotion regulation was utilized when a negative emotion was experienced.  Pt shared that he knows what to do to get out of this depressed state but has not been motivated to do so.  Pt states that he thinks he is still in denial about accepting that he is depressed.  Pt actively participated and was engaged in group discussion.    Reyes Ivan, LCSW 12/17/2012 2:16 PM

## 2012-12-17 NOTE — Progress Notes (Signed)
D: Patient pleasant and cooperative with staff and peers. Presents with appropriate affect and depressed mood. Patient complained of headache 5/10. He reported on the self inventory sheet that sleep is poor, appetite is good, energy level is low and ability to pay attention is improving. Patient rated depression "4" and feelings of hopelessness "6". He's attending and participating in groups. Compliant with current medication regimen.  A: Support and encouragement provided to patient. Scheduled medications administered per MD orders. Maintain Q15 minute checks for safety.  R: Patient receptive. Passive SI, but contracts for safety. Denies HI and auditory/visual hallucinations. Patient remains safe.

## 2012-12-17 NOTE — [facility] Suicide Risk Assessment (Signed)
Suicide Risk Assessment  Admission Assessment     Nursing information obtained from:    Demographic factors:    Current Mental Status:    Loss Factors:    Historical Factors:    Risk Reduction Factors:     CLINICAL FACTORS:   Severe Anxiety and/or Agitation Depression:   Anhedonia Hopelessness Impulsivity Insomnia Recent sense of peace/wellbeing Severe Unstable or Poor Therapeutic Relationship Previous Psychiatric Diagnoses and Treatments Medical Diagnoses and Treatments/Surgeries  COGNITIVE FEATURES THAT CONTRIBUTE TO RISK:  Closed-mindedness Loss of executive function Polarized thinking Thought constriction (tunnel vision)    SUICIDE RISK:   Moderate:  Frequent suicidal ideation with limited intensity, and duration, some specificity in terms of plans, no associated intent, good self-control, limited dysphoria/symptomatology, some risk factors present, and identifiable protective factors, including available and accessible social support.  PLAN OF CARE: Admit from [facility] long emergency department for major depressive disorder with suicidal ideation and has multiple psychosocial stressors.   I certify that inpatient services furnished can reasonably be expected to improve the patient's condition.  Anthony [name],Anthony R. 12/17/2012, 12:39 PM

## 2012-12-18 DIAGNOSIS — F332 Major depressive disorder, recurrent severe without psychotic features: Principal | ICD-10-CM

## 2012-12-18 DIAGNOSIS — R45851 Suicidal ideations: Secondary | ICD-10-CM

## 2012-12-18 MED ORDER — TRAZODONE HCL 100 MG PO TABS
100.0000 mg | ORAL_TABLET | Freq: Every evening | ORAL | Status: DC | PRN
  Administered 2012-12-18 – 2012-12-20 (×6): 100 mg via ORAL
  Filled 2012-12-18 (×8): qty 1

## 2012-12-18 MED ORDER — NAPROXEN 375 MG PO TABS
375.0000 mg | ORAL_TABLET | Freq: Two times a day (BID) | ORAL | Status: DC | PRN
  Administered 2012-12-18 – 2012-12-21 (×2): 375 mg via ORAL
  Filled 2012-12-18 (×2): qty 1

## 2012-12-18 NOTE — Progress Notes (Signed)
Patient ID: Anthony [name], male   DOB: June 27, 1950, 62 y.o.   MRN: [id number] D: Pt. Reports depression at "8" of 10. Pt. Reports day been "pretty good", sharing in groups have helped. A: Writer introduced self to client and provided emotional support. Staff encouraged group. R: Pt. Is safe on the unit and attended group.

## 2012-12-18 NOTE — Progress Notes (Signed)
[facility] MD Progress Note  12/18/2012 12:03 PM Anthony [name]  MRN:  [id number] Subjective:   Patient states "I did not sleep well last night. That's been going on for a while. I have been feeling a little irritable. I have been depressed about my medical problems. My depression is still high. I am trying to keep myself occupied. At home I was too depressed to clean up after myself and I would let things get messy."   Objective:  Patient has been visible on the unit today. He continues to report ongoing depressive symptoms and trouble sleeping. Anthony [name] is noted to exhibit a depressed mood and seems sad.Patient is reporting less suicidal thoughts since being admitted to the hospital. Patient has been going to groups and interacting with peers. The patient appears to be invested in his treatment.  Diagnosis:   DSM5: Schizophrenia Disorders:  Obsessive-Compulsive Disorders:  Trauma-Stressor Disorders:  Substance/Addictive Disorders:  Depressive Disorders: Major Depressive Disorder - Severe (296.23)  AXIS I: Major Depression, Recurrent severe  AXIS II: Deferred  AXIS III:  Past Medical History   Diagnosis  Date   .  CAD (coronary artery disease)      a. INF STEMI 07/04/10: tx with thrombectomy + [facility]; cath 07/04/10: dLM 10-20%, pLAD 40-50%, mLAD 20-30%, pRCA 30%, mRCA occluded and tx with PCI, EF 50% with inf HK. A Multilink   .  HLD (hyperlipidemia)    .  COPD (chronic obstructive pulmonary disease)    .  Tobacco abuse    .  Bipolar disorder    .  History of DVT (deep vein thrombosis)      traumatic, s/p coumadin tx.   .  Glucose intolerance (impaired glucose tolerance)      A1c 6.2 06/2010   .  Depression     AXIS IV: economic problems, housing problems, occupational problems, other psychosocial or environmental problems, problems related to social environment and problems with primary support group  AXIS V: 41-50 serious symptoms  ADL's:  Intact  Sleep: Poor  Appetite:   Fair  Suicidal Ideation:  Passive SI with plan Homicidal Ideation:  Denies AEB (as evidenced by):  Psychiatric Specialty Exam: Review of Systems  Constitutional: Negative.   HENT: Negative.   Eyes: Negative.   Respiratory: Negative.   Cardiovascular: Negative.   Gastrointestinal: Negative.   Genitourinary: Negative.   Musculoskeletal: Positive for joint pain.  Skin: Negative.   Neurological: Negative.   Endo/Heme/Allergies: Negative.   Psychiatric/Behavioral: Positive for depression and suicidal ideas. Negative for hallucinations, memory loss and substance abuse. The patient is nervous/anxious and has insomnia.     Blood pressure 135/85, pulse 84, temperature 97.8 F (36.6 C), temperature source Oral, resp. rate 20, height 5\' 10"  (1.778 m), SpO2 93.00%.There is no weight on file to calculate BMI.  General Appearance: Disheveled  Eye Contact::  Good  Speech:  Clear and Coherent  Volume:  Normal  Mood:  Dysphoric and Irritable  Affect:  Congruent  Thought Process:  Goal Directed and Intact  Orientation:  Full (Time, Place, and Person)  Thought Content:  Rumination  Suicidal Thoughts:  Yes.  with intent/plan  Homicidal Thoughts:  No  Memory:  Immediate;   Good Recent;   Good Remote;   Good  Judgement:  Fair  Insight:  Shallow  Psychomotor Activity:  Normal  Concentration:  Fair  Recall:  Good  Akathisia:  No  Handed:  Right  AIMS (if indicated):     Assets:  Communication Skills Desire for Improvement Resilience  Sleep:  Number of Hours: 3.75   Current Medications: Current Facility-Administered Medications  Medication Dose Route Frequency Provider Last Rate Last Dose  . acetaminophen (TYLENOL) tablet 1,000 mg  1,000 mg Oral Q6H PRN Benjaman Pott, MD   1,000 mg at 12/18/12 0943  . alum & mag hydroxide-simeth (MAALOX/MYLANTA) 200-200-20 MG/5ML suspension 30 mL  30 mL Oral Q4H PRN Benjaman Pott, MD      . aspirin EC tablet 81 mg  81 mg Oral Q lunch Benjaman Pott, MD   81 mg at 12/18/12 1147  . FLUoxetine (PROZAC) capsule 20 mg  20 mg Oral Daily Nehemiah Settle, MD   20 mg at 12/18/12 0738  . magnesium hydroxide (MILK OF MAGNESIA) suspension 30 mL  30 mL Oral Daily PRN Benjaman Pott, MD   30 mL at 12/18/12 1009  . metoprolol tartrate (LOPRESSOR) tablet 12.5 mg  12.5 mg Oral BID Benjaman Pott, MD   12.5 mg at 12/18/12 0739  . nicotine (NICODERM CQ - dosed in mg/24 hours) patch 21 mg  21 mg Transdermal Daily Benjaman Pott, MD   21 mg at 12/17/12 0907  . Rivaroxaban (XARELTO) tablet 20 mg  20 mg Oral Q supper Benjaman Pott, MD   20 mg at 12/17/12 1720  . traZODone (DESYREL) tablet 50 mg  50 mg Oral QHS,MR X 1 Kerry Hough, PA-C   50 mg at 12/17/12 2340    Lab Results:  Results for orders placed during the hospital encounter of 12/13/12 (from the past 48 hour(s))  GLUCOSE, CAPILLARY     Status: Abnormal   Collection Time    12/16/12 12:08 PM      Result Value Range   Glucose-Capillary 193 (*) 70 - 99 mg/dL    Physical Findings: AIMS: Facial and Oral Movements Muscles of Facial Expression: None, normal Lips and Perioral Area: None, normal Jaw: None, normal Tongue: None, normal,Extremity Movements Upper (arms, wrists, hands, fingers): None, normal Lower (legs, knees, ankles, toes): None, normal, Trunk Movements Neck, shoulders, hips: None, normal, Overall Severity Severity of abnormal movements (highest score from questions above): None, normal Incapacitation due to abnormal movements: None, normal Patient's awareness of abnormal movements (rate only patient's report): No Awareness, Dental Status Current problems with teeth and/or dentures?: Yes (no teeth, wears dentures) Does patient usually wear dentures?: Yes (no dentures with patient)  CIWA:    COWS:     Treatment Plan Summary: Daily contact with patient to assess and evaluate symptoms and progress in treatment Medication management  Plan: Continue crisis  management and stabilization.  Medication management:  Continue Prozac 20 mg daily for depression/anxiety.  Increase Trazodone to 100 mg hs.  Encouraged patient to attend groups and participate in group counseling sessions and activities.  Discharge plan in progress.  Continue current treatment plan.  Address health issues: Vitals reviewed and stable. Order Naproxen 500 mg BID prn for knee pain.   Medical Decision Making Problem Points:  Established problem, stable/improving (1) and Review of psycho-social stressors (1) Data Points:  Review of medication regiment & side effects (2)  I certify that inpatient services furnished can reasonably be expected to improve the patient's condition.   DAVIS, LAURA NP-C 12/18/2012, 12:03 PM  Reviewed the information documented and agree with the treatment plan.  Dickie Cloe,JANARDHAHA R. 12/18/2012 1:48 PM

## 2012-12-18 NOTE — Progress Notes (Signed)
Adult Psychoeducational Group Note  Date:  12/18/2012 Time:  1:14 PM  Group Topic/Focus:  Therapeutic Activity  Participation Level:  Active  Participation Quality:  Appropriate and Redirectable  Affect:  Appropriate  Cognitive:  Appropriate  Insight: Appropriate  Engagement in Group:  Lacking  Modes of Intervention:  Activity  Additional Comments:  Pts played a game of Charades using coping skills. Pt participated but had to be redirected from having side conversations.  Caswell Corwin 12/18/2012, 1:14 PM

## 2012-12-18 NOTE — Progress Notes (Signed)
The focus of this group is to educate the patient on the purpose and policies of crisis stabilization and provide a format to answer questions about their admission.  The group details unit policies and expectations of patients while admitted.  Patient attended 0900 nurse education orientation group this morning.  Patient listened, appropriate affect, alert, appropriate insight and engagement.  Today patient will work on 3 goals for discharge.  

## 2012-12-18 NOTE — Progress Notes (Signed)
Adult Psychoeducational Group Note  Date:  12/18/2012 Time:  1:55 PM  Group Topic/Focus:  Self Esteem Action Plan:   The focus of this group is to help patients create a plan to continue to build self-esteem after discharge.  Participation Level:  Active  Participation Quality:  Appropriate and Attentive  Affect:  Appropriate  Cognitive:  Appropriate  Insight: Appropriate  Engagement in Group:  Engaged and Supportive  Modes of Intervention:  Discussion, Socialization and Support  Additional Comments:  Pts discussed different things that can increase and decrease in our life. Pts worked on coming up with something that positively helps our self esteem for each letter of the alphabet. Pt came up with two things that improve his self-esteem, prayer and meditation.  Anthony [name] 12/18/2012, 1:55 PM

## 2012-12-18 NOTE — [facility] Group Notes (Signed)
[facility] LCSW Group Therapy  12/18/2012  1:15 PM   Type of Therapy:  Group Therapy  Participation Level:  Active  Participation Quality:  Attentive, Sharing and Supportive  Affect:  Depressed and Flat  Cognitive:  Alert and Oriented  Insight:  Developing/Improving, Engaged and Supportive  Engagement in Therapy:  Developing/Improving, Engaged and Supportive  Modes of Intervention:  Activity, Clarification, Confrontation, Discussion, Education, Exploration, Limit-setting, Orientation, Problem-solving, Rapport Building, Reality Testing, Socialization and Support  Summary of Progress/Problems: Patient was attentive and engaged with speaker from [employer].  Patient was attentive to speaker while they shared their story of dealing with mental health and overcoming it.  Patient expressed interest in their programs and services and received information on their agency.  Patient processed ways they can relate to the speaker.     Lydon Vansickle [name], LCSW 12/18/2012 2:35 PM  

## 2012-12-18 NOTE — [facility] Suicide Risk Assessment (Signed)
[facility] INPATIENT:  Family/Significant Other Suicide Prevention Education  Suicide Prevention Education:  Education Completed; Earma Reading - daughter [phone]),  (name of family member/significant other) has been identified by the patient as the family member/significant other with whom the patient will be residing, and identified as the person(s) who will aid the patient in the event of a mental health crisis (suicidal ideations/suicide attempt).  With written consent from the patient, the family member/significant other has been provided the following suicide prevention education, prior to the and/or following the discharge of the patient.  The suicide prevention education provided includes the following:  Suicide risk factors  Suicide prevention and interventions  National Suicide Hotline telephone number  [facility] assessment telephone number  [facility] Emergency Assistance 911  [facility] and/or Residential Mobile Crisis Unit telephone number  Request made of family/significant other to:  Remove weapons (e.g., guns, rifles, knives), all items previously/currently identified as safety concern.    Remove drugs/medications (over-the-counter, prescriptions, illicit drugs), all items previously/currently identified as a safety concern.  The family member/significant other verbalizes understanding of the suicide prevention education information provided.  The family member/significant other agrees to remove the items of safety concern listed above.  Carmina Miller 12/18/2012, 2:49 PM

## 2012-12-18 NOTE — Progress Notes (Signed)
Recreation Therapy Notes   Animal-Assisted Activity/Therapy (AAA/T) Program Checklist/Progress Notes Patient Eligibility Criteria Checklist & Daily Group note for Rec Tx Intervention  Date: 12.11.2014 Time: 2:45pm Location: [address] Programmer, applications   AAA/T Program Assumption of Risk Form signed by Patient/ or Parent Legal Guardian yes  Patient is free of allergies or sever asthma yes  Patient reports no fear of animals yes  Patient reports no history of cruelty to animals yes   Patient understands his/her participation is voluntary yes  Patient washes hands before animal contact yes  Patient washes hands after animal contact yes  Behavioral Response: Appropriate, Engaged, Attentive.   Education: Charity fundraiser, Health visitor   Education Outcome: Acknowledges understanding  Anthony [name] Anthony [name], LRT/CTRS  Jearl Klinefelter 12/18/2012 4:43 PM

## 2012-12-18 NOTE — Progress Notes (Signed)
D: Patient presents with appropriate affect and depressed mood. Patient is active in groups and compliant with medications. He's visible in the milieu and interactive with peers.  A: Support and encouragement provided to patient. Administered scheduled medications per ordering MD. Monitor Q15 minute checks for safety.  R: Patient receptive. Passive SI, but contracts for safety. Denies HI. Patient remains safe on the unit.

## 2012-12-18 NOTE — Progress Notes (Signed)
Adult Psychoeducational Group Note  Date:  12/18/2012 Time:  9:07 PM  Group Topic/Focus:  Goals Group:   The focus of this group is to help patients establish daily goals to achieve during treatment and discuss how the patient can incorporate goal setting into their daily lives to aide in recovery.  Participation Level:  Active  Participation Quality:  Appropriate  Affect:  Appropriate  Cognitive:  Appropriate  Insight: Appropriate  Engagement in Group:  Engaged  Modes of Intervention:  Discussion  Additional Comments:  Pt stated that his day was so so, it had it's ups and downs but was ok, wants to do something about his sleeping patterns. Stated that he didn't get enough sleep.  Terie Purser R 12/18/2012, 9:07 PM

## 2012-12-19 DIAGNOSIS — F329 Major depressive disorder, single episode, unspecified: Secondary | ICD-10-CM

## 2012-12-19 MED ORDER — SIMVASTATIN 20 MG PO TABS
20.0000 mg | ORAL_TABLET | Freq: Every day | ORAL | Status: DC
  Administered 2012-12-19 – 2012-12-21 (×3): 20 mg via ORAL
  Filled 2012-12-19 (×5): qty 1

## 2012-12-19 NOTE — Progress Notes (Signed)
Adult Psychoeducational Group Note  Date:  12/19/2012 Time:  11:07 AM  Group Topic/Focus:  Stages of Change:   The focus of this group is to explain the stages of change and help patients identify changes they want to make upon discharge.  Participation Level:  Active  Participation Quality:  Appropriate  Affect:  Appropriate  Cognitive:  Appropriate  Insight: Appropriate  Engagement in Group:  Engaged  Modes of Intervention:  Discussion and Education    Roanna Banning, [place] N 12/19/2012, 11:07 AM

## 2012-12-19 NOTE — [facility] Group Notes (Signed)
[facility] LCSW Aftercare Discharge Planning Group Note   12/19/2012 1:19 PM    Participation Quality:  Appropraite  Mood/Affect:  Appropriate  Depression Rating:    Anxiety Rating:    Thoughts of Suicide:  No  Will you contract for safety?   NA  Current AVH:  No  Plan for Discharge/Comments:  Patient attended discharge planning group and actively participated in group.  He reports doing well and hopes to discharge today.  He advised he and his CSW are working on discharge plans. CSW provided all participants with daily workbook.   Transportation Means: Patient has transportation.   Supports:  Patient has a support system.   Anthony [name], Joesph July

## 2012-12-19 NOTE — Tx Team (Signed)
Interdisciplinary Treatment Plan Update (Adult)  Date: 12/19/2012  Time Reviewed:  9:45 AM  Progress in Treatment: Attending groups: Yes Participating in groups:  Yes Taking medication as prescribed:  Yes Tolerating medication:  Yes Family/Significant othe contact made: Yes, with pt's daughter Patient understands diagnosis:  Yes Discussing patient identified problems/goals with staff:  Yes Medical problems stabilized or resolved:  Yes Denies suicidal/homicidal ideation: Yes Issues/concerns per patient self-inventory:  Yes Other:  New problem(s) identified: N/A  Discharge Plan or Barriers: Pt will follow up at [facility] for medication management and therapy.    Reason for Continuation of Hospitalization: Anxiety Depression Medication Stabilization  Comments: N/A  Estimated length of stay: 2-3 days  For review of initial/current patient goals, please see plan of care.  Attendees: Patient:     Family:     Physician:  Dr. Javier Glazier 12/19/2012 10:35 AM   Nursing:   Audie Box, RN 12/19/2012 10:35 AM   Clinical Social Worker:  Reyes Ivan, LCSW 12/19/2012 10:35 AM   Other: Verne Spurr, PA 12/19/2012 10:35 AM   Other:  Lamount Cranker, RN 12/19/2012 10:35 AM   Other:  Juline Patch, LCSW 12/19/2012 10:35 AM   Other:     Other:    Other:    Other:    Other:    Other:    Other:     Scribe for Treatment Team:   Carmina Miller, 12/19/2012 10:35 AM

## 2012-12-19 NOTE — [facility] Group Notes (Signed)
[facility] LCSW Group Therapy  Feelings Around Relapse 1:15 -2:30        12/19/2012  2:56 PM   Type of Therapy:  Group Therapy  Participation Level:  Appropriate  Participation Quality:  Appropriate  Affect:  Appropriate  Cognitive:  Attentive Appropriate  Insight:  Developing/Improving  Engagement in Therapy: Developing/Improving  Modes of Intervention:  Discussion Exploration Problem-Solving Supportive  Summary of Progress/Problems:  The topic for today was feelings around relapse.    Patient processed feelings toward relapse and was able to relate to peers. Patient shared if he were to relapse he would be engaging negative thoughts.  Patient identified coping skills that can be used to prevent a relapse.   Wynn Banker 12/19/2012 2:56 PM

## 2012-12-19 NOTE — Progress Notes (Signed)
Patient observed in bed. Patient appears anxious. Reports anxiety 12/10, depression 10/10, c/o insomnia. Patient states that he attended groups throughout the day, continues to have "racing thoughts, strange thoughts". States his feelings still "up and down". Denies SI, HI, AVH at present. Patient reports that he "has trouble with Prozac" and prefers Wellbutrin.   Encouragement offered. Patient given trazodone, prn tylenol.   Patient safety maintained, Q 15 checks continue.

## 2012-12-19 NOTE — Progress Notes (Signed)
D: Patient's affect is appropriate to circumstance and mood is depressed. He reported on the self inventory sheet that he's sleeping well, appetite and ability to pay attention are both good and energy level is normal. Patient rated depression "5" and feelings of hopelessness "4". He's actively participating in groups and attending meals. Patient tolerating medications well.  A: Support and encouragement provided to patient. Scheduled medications administered per MD orders. Maintain Q15 minute checks for safety.  R: Patient receptive. Denies SI/HI and auditory/visual hallucinations. Patient remains safe.

## 2012-12-19 NOTE — Progress Notes (Signed)
[facility] MD Progress Note  12/19/2012 2:18 PM Anthony [name]  MRN:  [id number] Subjective:  Patient's sleep was better last night after Trazodone but a "little dry mouth", appetite is fair.  Ruminating over issues last night and how he got upset with a minute detail of his account---difficult to redirect---focused on the negativity, pain in his leg is much better.  Everything is a negative and a complaint, nothing positive to say...Marland KitchenMarland Kitchenasked for him to think of 3 positives prior to sleep. Diagnosis:   DSM5:  Depressive Disorders:  Major Depressive Disorder - Severe (296.23)  Axis I: Anxiety Disorder NOS and Major Depression, Recurrent severe Axis II: Deferred Axis III:  Past Medical History  Diagnosis Date  . CAD (coronary artery disease)     a. INF STEMI 07/04/10:  tx with thrombectomy + [facility] BMS to [facility];  cath 07/04/10: dLM 10-20%, pLAD 40-50%, mLAD 20-30%, pRCA 30%, mRCA occluded and tx with PCI, EF 50% with inf HK. A Multilink  . HLD (hyperlipidemia)   . COPD (chronic obstructive pulmonary disease)   . Tobacco abuse   . Bipolar disorder   . History of DVT (deep vein thrombosis)     traumatic, s/p coumadin tx.  . Glucose intolerance (impaired glucose tolerance)     A1c 6.2 06/2010  . Depression    Axis IV: economic problems, other psychosocial or environmental problems, problems related to social environment and problems with primary support group Axis V: 41-50 serious symptoms  ADL's:  Intact  Sleep: Fair  Appetite:  Fair  Suicidal Ideation:  Plan:  overdose Intent:  yes Means:  none Homicidal Ideation:  Denies  Psychiatric Specialty Exam: Review of Systems  Constitutional: Negative.   HENT: Negative.   Eyes: Negative.   Respiratory: Negative.   Cardiovascular: Negative.   Gastrointestinal: Negative.   Genitourinary: Negative.   Musculoskeletal: Negative.   Skin: Negative.   Neurological: Negative.   Endo/Heme/Allergies: Negative.   Psychiatric/Behavioral: Positive  for depression and suicidal ideas. The patient is nervous/anxious.     Blood pressure 135/88, pulse 76, temperature 97.3 F (36.3 C), temperature source Oral, resp. rate 19, height 5\' 10"  (1.778 m), SpO2 93.00%.There is no weight on file to calculate BMI.  General Appearance: Casual  Eye Contact::  Fair  Speech:  Normal Rate  Volume:  Normal  Mood:  Anxious and Depressed  Affect:  Congruent  Thought Process:  Coherent  Orientation:  Full (Time, Place, and Person)  Thought Content:  WDL  Suicidal Thoughts:  Yes.  with intent/plan  Homicidal Thoughts:  No  Memory:  Immediate;   Fair Recent;   Fair Remote;   Fair  Judgement:  Fair  Insight:  Fair  Psychomotor Activity:  Decreased  Concentration:  Fair  Recall:  Fair  Akathisia:  No  Handed:  Right  AIMS (if indicated):     Assets:  Resilience Social Support  Sleep:  Number of Hours: 5.75   Current Medications: Current Facility-Administered Medications  Medication Dose Route Frequency Provider Last Rate Last Dose  . acetaminophen (TYLENOL) tablet 1,000 mg  1,000 mg Oral Q6H PRN Benjaman Pott, MD   1,000 mg at 12/18/12 2028  . alum & mag hydroxide-simeth (MAALOX/MYLANTA) 200-200-20 MG/5ML suspension 30 mL  30 mL Oral Q4H PRN Benjaman Pott, MD      . aspirin EC tablet 81 mg  81 mg Oral Q lunch Benjaman Pott, MD   81 mg at 12/19/12 1149  . FLUoxetine (PROZAC) capsule 20  mg  20 mg Oral Daily Nehemiah Settle, MD   20 mg at 12/19/12 0816  . magnesium hydroxide (MILK OF MAGNESIA) suspension 30 mL  30 mL Oral Daily PRN Benjaman Pott, MD   30 mL at 12/18/12 1009  . metoprolol tartrate (LOPRESSOR) tablet 12.5 mg  12.5 mg Oral BID Benjaman Pott, MD   12.5 mg at 12/19/12 0816  . naproxen (NAPROSYN) tablet 375 mg  375 mg Oral BID BM PRN Fransisca Kaufmann, NP   375 mg at 12/18/12 1822  . nicotine (NICODERM CQ - dosed in mg/24 hours) patch 21 mg  21 mg Transdermal Daily Benjaman Pott, MD   21 mg at 12/19/12 0817  . Rivaroxaban  (XARELTO) tablet 20 mg  20 mg Oral Q supper Benjaman Pott, MD   20 mg at 12/18/12 1708  . simvastatin (ZOCOR) tablet 20 mg  20 mg Oral q1800 Nanine Means, NP      . traZODone (DESYREL) tablet 100 mg  100 mg Oral QHS,MR X 1 Fransisca Kaufmann, NP   100 mg at 12/18/12 2303    Lab Results: No results found for this or any previous visit (from the past 48 hour(s)).  Physical Findings: AIMS: Facial and Oral Movements Muscles of Facial Expression: None, normal Lips and Perioral Area: None, normal Jaw: None, normal Tongue: None, normal,Extremity Movements Upper (arms, wrists, hands, fingers): None, normal Lower (legs, knees, ankles, toes): None, normal, Trunk Movements Neck, shoulders, hips: None, normal, Overall Severity Severity of abnormal movements (highest score from questions above): None, normal Incapacitation due to abnormal movements: None, normal Patient's awareness of abnormal movements (rate only patient's report): No Awareness, Dental Status Current problems with teeth and/or dentures?: Yes Does patient usually wear dentures?:  (no dentures with pt.)  CIWA:    COWS:     Treatment Plan Summary: Daily contact with patient to assess and evaluate symptoms and progress in treatment Medication management  Plan:  Review of chart, vital signs, medications, and notes. 1-Individual and group therapy 2-Medication management for depression and anxiety:  Medications reviewed with the patient and he stated no untoward effects, no changes made 3-Coping skills for depression, anxiety 4-Continue crisis stabilization and management 5-Address health issues--monitoring vital signs, stable 6-Treatment plan in progress to prevent relapse of depression and anxiety  Medical Decision Making Problem Points:  Established problem, stable/improving (1) and Review of psycho-social stressors (1) Data Points:  Review of new medications or change in dosage (2)  I certify that inpatient services furnished can  reasonably be expected to improve the patient's condition.   Nanine Means, PMH-NP 12/19/2012, 2:18 PM  Reviewed the information documented and agree with the treatment plan.  Evelio Rueda,JANARDHAHA R. 12/20/2012 11:43 AM

## 2012-12-19 NOTE — Progress Notes (Signed)
D: Pt in bed resting with eyes closed. Respirations even and unlabored. Pt appears to be in no signs of distress at this time. A: Q15min checks remains for this pt. R: Pt remains safe at this time.   

## 2012-12-20 MED ORDER — HYDROXYZINE HCL 50 MG PO TABS
50.0000 mg | ORAL_TABLET | Freq: Once | ORAL | Status: AC
  Administered 2012-12-20: 50 mg via ORAL
  Filled 2012-12-20 (×2): qty 1

## 2012-12-20 MED ORDER — HYDROXYZINE HCL 25 MG PO TABS
25.0000 mg | ORAL_TABLET | Freq: Three times a day (TID) | ORAL | Status: DC
  Administered 2012-12-20 – 2012-12-22 (×6): 25 mg via ORAL
  Filled 2012-12-20 (×2): qty 1
  Filled 2012-12-20: qty 42
  Filled 2012-12-20 (×6): qty 1
  Filled 2012-12-20: qty 42
  Filled 2012-12-20 (×2): qty 1
  Filled 2012-12-20: qty 42

## 2012-12-20 MED ORDER — BUPROPION HCL ER (XL) 150 MG PO TB24
150.0000 mg | ORAL_TABLET | Freq: Every day | ORAL | Status: DC
  Administered 2012-12-20 – 2012-12-22 (×3): 150 mg via ORAL
  Filled 2012-12-20: qty 1
  Filled 2012-12-20: qty 14
  Filled 2012-12-20 (×5): qty 1

## 2012-12-20 NOTE — Progress Notes (Signed)
Psychoeducational Group Note  Date: 12/20/2012 Time:  1015  Group Topic/Focus:  Identifying Needs:   The focus of this group is to help patients identify their personal needs that have been historically problematic and identify healthy behaviors to address their needs.  Participation Level:  Active  Participation Quality:  Appropriate  Affect:  Appropriate  Cognitive:  Appropriate  Insight:  Engaged  Engagement in Group:  Engaged  Additional Comments:    [name] A  

## 2012-12-20 NOTE — Progress Notes (Signed)
 .  Psychoeducational Group Note    Date: 12/20/2012 Time: 0930  Goal Setting Purpose of Group: To be able to set a goal that is measurable and that can be accomplished in one day Participation Level:  Active  Participation Quality:  Appropriate  Affect:  Appropriate  Cognitive:  Oriented  Insight:  Improving  Engagement in Group:  Engaged  Additional Comments:    Elisabeth Strom A 

## 2012-12-20 NOTE — Progress Notes (Signed)
Patient ID: Anthony [name], male   DOB: 1950-08-04, 62 y.o.   MRN: [id number] [facility] MD Progress Note  12/20/2012 12:45 PM Anthony [name]  MRN:  [id number] Subjective:  Hawken states he is doing better than before, he is thinking of positive things to say and do. He reports resting well last night, after he was given 2 trazadone and another medication. He also notes a decrease in his withdrawal symtpoms. Upon discharge he is hoping to get into into outpatient therapy, state he has support from his daughters. He really wishes to stop smoking currently on the patch. Denies any SI/HI/AVH. Reports limited amount of pain in his legs and ankles, states it is no big deal. Denies the need for pain medication at this time.  Diagnosis:   DSM5:  Depressive Disorders:  Major Depressive Disorder - Severe (296.23)  Axis I: Anxiety Disorder NOS and Major Depression, Recurrent severe Axis II: Deferred Axis III:  Past Medical History  Diagnosis Date  . CAD (coronary artery disease)     a. INF STEMI 07/04/10:  tx with thrombectomy + [facility] BMS to [facility];  cath 07/04/10: dLM 10-20%, pLAD 40-50%, mLAD 20-30%, pRCA 30%, mRCA occluded and tx with PCI, EF 50% with inf HK. A Multilink  . HLD (hyperlipidemia)   . COPD (chronic obstructive pulmonary disease)   . Tobacco abuse   . Bipolar disorder   . History of DVT (deep vein thrombosis)     traumatic, s/p coumadin tx.  . Glucose intolerance (impaired glucose tolerance)     A1c 6.2 06/2010  . Depression    Axis IV: economic problems, other psychosocial or environmental problems, problems related to social environment and problems with primary support group Axis V: 41-50 serious symptoms  ADL's:  Intact  Sleep: Fair  Appetite:  Fair  Suicidal Ideation:  Plan:  overdose Intent:  yes Means:  none Homicidal Ideation:  Denies  Psychiatric Specialty Exam: Review of Systems  Constitutional: Negative.   HENT: Negative.   Eyes: Negative.   Respiratory: Negative.    Cardiovascular: Negative.   Gastrointestinal: Negative.   Genitourinary: Negative.   Musculoskeletal: Negative.   Skin: Negative.   Neurological: Negative.   Endo/Heme/Allergies: Negative.   Psychiatric/Behavioral: Positive for depression. Negative for suicidal ideas. The patient is nervous/anxious.     Blood pressure 144/69, pulse 88, temperature 98 F (36.7 C), temperature source Oral, resp. rate 18, height 5\' 10"  (1.778 m), SpO2 93.00%.There is no weight on file to calculate BMI.  General Appearance: Casual  Eye Contact::  Fair  Speech:  Normal Rate  Volume:  Normal  Mood:  Anxious and Depressed  Affect:  Congruent  Thought Process:  Coherent  Orientation:  Full (Time, Place, and Person)  Thought Content:  WDL  Suicidal Thoughts:  Yes.  with intent/plan  Homicidal Thoughts:  No  Memory:  Immediate;   Fair Recent;   Fair Remote;   Fair  Judgement:  Fair  Insight:  Fair  Psychomotor Activity:  Decreased  Concentration:  Fair  Recall:  Fair  Akathisia:  No  Handed:  Right  AIMS (if indicated):     Assets:  Resilience Social Support  Sleep:  Number of hours of sleep 6-7   Current Medications: Current Facility-Administered Medications  Medication Dose Route Frequency Provider Last Rate Last Dose  . acetaminophen (TYLENOL) tablet 1,000 mg  1,000 mg Oral Q6H PRN Benjaman Pott, MD   1,000 mg at 12/20/12 0810  . alum & mag hydroxide-simeth (  MAALOX/MYLANTA) 200-200-20 MG/5ML suspension 30 mL  30 mL Oral Q4H PRN Benjaman Pott, MD      . aspirin EC tablet 81 mg  81 mg Oral Q lunch Benjaman Pott, MD   81 mg at 12/20/12 1120  . FLUoxetine (PROZAC) capsule 20 mg  20 mg Oral Daily Nehemiah Settle, MD   20 mg at 12/19/12 0816  . magnesium hydroxide (MILK OF MAGNESIA) suspension 30 mL  30 mL Oral Daily PRN Benjaman Pott, MD   30 mL at 12/19/12 2252  . metoprolol tartrate (LOPRESSOR) tablet 12.5 mg  12.5 mg Oral BID Benjaman Pott, MD   12.5 mg at 12/20/12 0751  .  naproxen (NAPROSYN) tablet 375 mg  375 mg Oral BID BM PRN Fransisca Kaufmann, NP   375 mg at 12/18/12 1822  . nicotine (NICODERM CQ - dosed in mg/24 hours) patch 21 mg  21 mg Transdermal Daily Benjaman Pott, MD   21 mg at 12/20/12 0749  . Rivaroxaban (XARELTO) tablet 20 mg  20 mg Oral Q supper Benjaman Pott, MD   20 mg at 12/19/12 1723  . simvastatin (ZOCOR) tablet 20 mg  20 mg Oral q1800 Nanine Means, NP   20 mg at 12/19/12 1723  . traZODone (DESYREL) tablet 100 mg  100 mg Oral QHS,MR X 1 Fransisca Kaufmann, NP   100 mg at 12/20/12 1610    Lab Results: No results found for this or any previous visit (from the past 48 hour(s)).  Physical Findings: AIMS: Facial and Oral Movements Muscles of Facial Expression: None, normal Lips and Perioral Area: None, normal Jaw: None, normal Tongue: None, normal,Extremity Movements Upper (arms, wrists, hands, fingers): None, normal Lower (legs, knees, ankles, toes): None, normal, Trunk Movements Neck, shoulders, hips: None, normal, Overall Severity Severity of abnormal movements (highest score from questions above): None, normal Incapacitation due to abnormal movements: None, normal Patient's awareness of abnormal movements (rate only patient's report): No Awareness, Dental Status Current problems with teeth and/or dentures?: Yes Does patient usually wear dentures?:  (no dentures with pt.)  CIWA:    COWS:     Treatment Plan Summary: Daily contact with patient to assess and evaluate symptoms and progress in treatment Medication management  Plan:  Review of chart, vital signs, medications, and notes. 1-Individual and group therapy 2-Medication management for depression and anxiety:  Medications reviewed with the patient and he stated no untoward effects, no changes made 3-Coping skills for depression, anxiety 4-Continue crisis stabilization and management 5-Address health issues--monitoring vital signs, stable 6-Treatment plan in progress to prevent relapse  of depression and anxiety  Medical Decision Making Problem Points:  Established problem, stable/improving (1) and Review of psycho-social stressors (1) Data Points:  Review of new medications or change in dosage (2)  I certify that inpatient services furnished can reasonably be expected to improve the patient's condition.   Truman Hayward, FNP-BC  12/20/2012, 12:45 PM  Reviewed the information documented and agree with the treatment plan.  Truman Hayward 12/20/2012 12:45 PM

## 2012-12-20 NOTE — Progress Notes (Signed)
[facility] Group Notes:  (Nursing/MHT/Case Management/Adjunct)  Date:  12/20/2012  Time:  10:10 PM  Type of Therapy:  Group Therapy  Participation Level:  Active  Participation Quality:  Appropriate  Affect:  Appropriate  Cognitive:  Appropriate  Insight:  Appropriate  Engagement in Group:  Engaged  Modes of Intervention:  Discussion  Summary of Progress/Problems:The patient expressed in Group that its not good to be catered to all the time.The patient said that you have to have better coping skills.  Octavio Manns 12/20/2012, 10:10 PM
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
## 2012-12-20 NOTE — Progress Notes (Addendum)
Patient ID: Anthony [name], male   DOB: June 18, 1950, 62 y.o.   MRN: [id number] 12-20-12 nursing shift note: D: pt is very talkative and very engaging with staff. He is going to groups and complained of some right leg pain. He is coming to the medication window for his am medications. He is having passive SI.  Pt is refusing his Prozac he stated it increases his anxiety.  A: med's have been administered with no adverse effects. He was given acetaminophen for his right leg pain 3/10 and was reduced to a 1/10. He is able to contract for passive SI, verbally. R: on his inventory sheet he wrote: slept well, appetite good, energy normal, attention poor with his depression at 4 and hopelessness at 7. No w/d symptoms. After discharge he plans to "eat better, pray,exercise". RN will monitor and Q 15 min ck's continue.

## 2012-12-21 NOTE — Progress Notes (Signed)
Patient ID: Anthony [name], male   DOB: December 20, 1950, 62 y.o.   MRN: [id number]  D: Patient pleasant on approach today. Reports poor sleep and hip/leg pain this am. Gives depression "4" and hopelessness "3" on scales. Currently denies any SI but says the thoughts come and go. Denies any active thoughts about suicide. Reports trazodone not working for him.  A: Staff will monitor on q 15 minute checks, follow treatment plan, and give meds as ordered. R: Took scheduled meds and also got naprosyn for pain this am.

## 2012-12-21 NOTE — Progress Notes (Signed)
Adult Psychoeducational Group Note  Date:  12/21/2012 Time:  9:30 PM  Group Topic/Focus:  Wrap-Up Group:   The focus of this group is to help patients review their daily goal of treatment and discuss progress on daily workbooks.  Participation Level:  Active  Participation Quality:  Appropriate and Attentive  Affect:  Appropriate  Cognitive:  Appropriate  Insight: Appropriate  Engagement in Group:  Engaged  Modes of Intervention:  Discussion and Support  Anthony [name] 12/21/2012, 9:30 PM

## 2012-12-21 NOTE — Progress Notes (Signed)
[facility] MD Progress Note  12/21/2012 11:02 AM Anthony [name]  MRN:  [id number] Subjective:  Patient said his sleep was poor due to his roommate being up frequently.  He stated his Trazodone does not work but the Vistaril works, Trazodone discontinued and Vistaril increased.  Appetite is "good", depression is "a little better" with only brief thoughts of suicide at times, he also stated, "I would be happy to stay longer." Diagnosis:   DSM5:  Depressive Disorders:  Major Depressive Disorder - Severe (296.23)  Axis I: Major Depression, Recurrent severe Axis II: Deferred Axis III:  Past Medical History  Diagnosis Date  . CAD (coronary artery disease)     a. INF STEMI 07/04/10:  tx with thrombectomy + Vision BMS to [facility];  cath 07/04/10: dLM 10-20%, pLAD 40-50%, mLAD 20-30%, pRCA 30%, mRCA occluded and tx with PCI, EF 50% with inf HK. A Multilink  . HLD (hyperlipidemia)   . COPD (chronic obstructive pulmonary disease)   . Tobacco abuse   . Bipolar disorder   . History of DVT (deep vein thrombosis)     traumatic, s/p coumadin tx.  . Glucose intolerance (impaired glucose tolerance)     A1c 6.2 06/2010  . Depression    Axis IV: economic problems, housing problems, other psychosocial or environmental problems, problems related to social environment and problems with primary support group Axis V: 41-50 serious symptoms  ADL's:  Intact  Sleep: Poor  Appetite:  Good  Suicidal Ideation:  Plan:  none Intent:  none Means:  none Homicidal Ideation:  Denies   Psychiatric Specialty Exam: Review of Systems  Constitutional: Negative.   HENT: Negative.   Eyes: Negative.   Respiratory: Negative.   Cardiovascular: Negative.   Gastrointestinal: Negative.   Genitourinary: Negative.   Musculoskeletal: Negative.   Skin: Negative.   Neurological: Negative.   Endo/Heme/Allergies: Negative.   Psychiatric/Behavioral: Positive for depression. The patient is nervous/anxious.     Blood pressure  131/90, pulse 86, temperature 97.8 F (36.6 C), temperature source Oral, resp. rate 18, height 5\' 10"  (1.778 m), SpO2 93.00%.There is no weight on file to calculate BMI.  General Appearance: Casual  Eye Contact::  Fair  Speech:  Normal Rate  Volume:  Normal  Mood:  Depressed  Affect:  Congruent  Thought Process:  Coherent  Orientation:  Full (Time, Place, and Person)  Thought Content:  WDL  Suicidal Thoughts:  Yes.  without intent/plan  Homicidal Thoughts:  No  Memory:  Immediate;   Fair Recent;   Fair Remote;   Fair  Judgement:  Fair  Insight:  Fair  Psychomotor Activity:  Decreased  Concentration:  Fair  Recall:  Fair  Akathisia:  No  Handed:  Right  AIMS (if indicated):     Assets:  Resilience  Sleep:  Number of Hours: 4.25   Current Medications: Current Facility-Administered Medications  Medication Dose Route Frequency Provider Last Rate Last Dose  . acetaminophen (TYLENOL) tablet 1,000 mg  1,000 mg Oral Q6H PRN Benjaman Pott, MD   1,000 mg at 12/20/12 2044  . alum & mag hydroxide-simeth (MAALOX/MYLANTA) 200-200-20 MG/5ML suspension 30 mL  30 mL Oral Q4H PRN Benjaman Pott, MD      . aspirin EC tablet 81 mg  81 mg Oral Q lunch Benjaman Pott, MD   81 mg at 12/20/12 1120  . buPROPion (WELLBUTRIN XL) 24 hr tablet 150 mg  150 mg Oral Daily Nanine Means, NP   150 mg at 12/21/12 0810  .  hydrOXYzine (ATARAX/VISTARIL) tablet 25 mg  25 mg Oral TID Nanine Means, NP   25 mg at 12/21/12 0807  . magnesium hydroxide (MILK OF MAGNESIA) suspension 30 mL  30 mL Oral Daily PRN Benjaman Pott, MD   30 mL at 12/19/12 2252  . metoprolol tartrate (LOPRESSOR) tablet 12.5 mg  12.5 mg Oral BID Benjaman Pott, MD   12.5 mg at 12/21/12 1610  . naproxen (NAPROSYN) tablet 375 mg  375 mg Oral BID BM PRN Fransisca Kaufmann, NP   375 mg at 12/21/12 9604  . nicotine (NICODERM CQ - dosed in mg/24 hours) patch 21 mg  21 mg Transdermal Daily Benjaman Pott, MD   21 mg at 12/21/12 5409  . Rivaroxaban  (XARELTO) tablet 20 mg  20 mg Oral Q supper Benjaman Pott, MD   20 mg at 12/20/12 1658  . simvastatin (ZOCOR) tablet 20 mg  20 mg Oral q1800 Nanine Means, NP   20 mg at 12/20/12 8119    Lab Results: No results found for this or any previous visit (from the past 48 hour(s)).  Physical Findings: AIMS: Facial and Oral Movements Muscles of Facial Expression: None, normal Lips and Perioral Area: None, normal Jaw: None, normal Tongue: None, normal,Extremity Movements Upper (arms, wrists, hands, fingers): None, normal Lower (legs, knees, ankles, toes): None, normal, Trunk Movements Neck, shoulders, hips: None, normal, Overall Severity Severity of abnormal movements (highest score from questions above): None, normal Incapacitation due to abnormal movements: None, normal Patient's awareness of abnormal movements (rate only patient's report): No Awareness, Dental Status Current problems with teeth and/or dentures?: Yes Does patient usually wear dentures?:  (no dentures with pt.)  CIWA:    COWS:     Treatment Plan Summary: Daily contact with patient to assess and evaluate symptoms and progress in treatment Medication management  Plan:  Review of chart, vital signs, medications, and notes. 1-Individual and group therapy 2-Medication management for depression and anxiety:  Medications reviewed with the patient and Trazodone discontinued, Vistaril 50 mg at bedtime ordered for sleep and anxiety 3-Coping skills for depression, anxiety 4-Continue crisis stabilization and management 5-Address health issues--monitoring vital signs, stable 6-Treatment plan in progress to prevent relapse of depression and anxiety  Medical Decision Making Problem Points:  Established problem, stable/improving (1) and Review of psycho-social stressors (1) Data Points:  Review of new medications or change in dosage (2)  I certify that inpatient services furnished can reasonably be expected to improve the patient's  condition.   Nanine Means, PMH-NP 12/21/2012, 11:02 AM

## 2012-12-21 NOTE — Progress Notes (Signed)
Patient ID: Anthony [name], male   DOB: 02/05/1950, 62 y.o.   MRN: [id number] D)  Has been out and about on the hall this evening, pleasant, cooperative, conversational and appropriate.  Was medicated with tylenol for pain in his leg, decreased from 6 to 4.  Attended group this evening and participated, sense of humor returning, insightful.  Contracts for safety.   A)  Will continue to monitor for safety, continue POC R)  Safety maintained.

## 2012-12-21 NOTE — [facility] Group Notes (Signed)
[facility] Group Notes:  (Clinical Social Work)  12/21/2012   1:15-2:20PM  Summary of Progress/Problems:  The main focus of today's process group was to   identify the patient's current support system and decide on other supports that can be put in place.  The picture on workbook was used to discuss why additional supports are needed.  An emphasis was placed on using counselor, doctor, therapy groups, 12-step groups, and problem-specific support groups to expand supports.   There was also an extensive discussion about what constitutes a healthy support versus an unhealthy support.  The patient expressed full comprehension of the concepts presented, and agreed that there is a need to add more supports.  The patient reported a willingness to add the [facility].  Type of Therapy:  Process Group  Participation Level:  Active  Participation Quality:  Attentive and Sharing  Affect:  Blunted  Cognitive:  Appropriate and Oriented  Insight:  Developing/Improving  Engagement in Therapy:  Engaged  Modes of Intervention:  Education,  Support and [employer], LCSW 12/21/2012, 4:00pm

## 2012-12-21 NOTE — Progress Notes (Signed)
Psychoeducational Group Note  Date: 12/21/2012 Time:0930  Group Topic/Focus:  Gratefulness:  The focus of this group is to help patients identify what two things they are most grateful for in their lives. What helps ground them and to center them on their work to their recovery.  Participation Level:  Active  Participation Quality:  Appropriate  Affect:  Appropriate  Cognitive:  Appropriate  Insight:  Improving  Engagement in Group:  Engaged  Additional Comments:    Konstantine Gervasi A   

## 2012-12-21 NOTE — Progress Notes (Signed)
Psychoeducational Group Note  Date:  12/21/2012 Time:  1015  Group Topic/Focus:  Making Healthy Choices:   The focus of this group is to help patients identify negative/unhealthy choices they were using prior to admission and identify positive/healthier coping strategies to replace them upon discharge.  Participation Level:  Active  Participation Quality:  Appropriate  Affect:  Appropriate  Cognitive:  Oriented  Insight:  Improving  Engagement in Group:  Engaged  Additional Comments:    Layton Naves A 12/21/2012  

## 2012-12-22 MED ORDER — HYDROXYZINE HCL 50 MG PO TABS
50.0000 mg | ORAL_TABLET | Freq: Every evening | ORAL | Status: DC | PRN
  Administered 2012-12-22: 50 mg via ORAL
  Filled 2012-12-22: qty 1

## 2012-12-22 MED ORDER — LOVASTATIN 40 MG PO TABS: 40.0000 mg | ORAL_TABLET | Freq: Every day | ORAL | Status: DC

## 2012-12-22 MED ORDER — METOPROLOL TARTRATE 25 MG PO TABS: 12.5000 mg | ORAL_TABLET | Freq: Two times a day (BID) | ORAL | Status: DC

## 2012-12-22 MED ORDER — NAPROXEN SODIUM 220 MG PO TABS: 220.0000 mg | ORAL_TABLET | Freq: Two times a day (BID) | ORAL | Status: DC | PRN

## 2012-12-22 MED ORDER — HYDROXYZINE HCL 25 MG PO TABS: 25.0000 mg | ORAL_TABLET | Freq: Three times a day (TID) | ORAL | Status: DC

## 2012-12-22 MED ORDER — BUPROPION HCL ER (XL) 150 MG PO TB24: 150.0000 mg | ORAL_TABLET | Freq: Every day | ORAL | Status: DC

## 2012-12-22 MED ORDER — RIVAROXABAN 20 MG PO TABS: 20.0000 mg | ORAL_TABLET | Freq: Every day | ORAL | Status: DC

## 2012-12-22 MED ORDER — ASPIRIN EC 81 MG PO TBEC: 81.0000 mg | DELAYED_RELEASE_TABLET | Freq: Every morning | ORAL | Status: DC

## 2012-12-22 NOTE — [facility] Group Notes (Signed)
[facility] LCSW Aftercare Discharge Planning Group Note   12/22/2012 8:45 AM  Participation Quality:  Alert, Appropriate and Oriented  Mood/Affect:  Calm  Depression Rating:  1  Anxiety Rating:  1  Thoughts of Suicide:  Pt denies SI/HI  Will you contract for safety?   Yes  Current AVH:  Pt denies  Plan for Discharge/Comments:  Pt attended discharge planning group and actively participated in group.  CSW provided pt with today's workbook.  Pt reports feeling stable to d/c today.  Pt will return home in [place] and has follow up scheduled at [facility].  Pt is also interested following up at [facility] for additional support.   No further needs voiced by pt at this time.    Transportation Means: Pt reports access to transportation - pt has own car here  Supports: No supports mentioned at this time  Anthony Ivan, LCSW 12/22/2012 10:16 AM

## 2012-12-22 NOTE — Progress Notes (Signed)
[facility] Adult Case Management Discharge Plan :  Will you be returning to the same living situation after discharge: Yes,  returning home At discharge, do you have transportation home?:Yes,  has own car here Do you have the ability to pay for your medications:Yes,  access to meds  Release of information consent forms completed and in the chart;  Patient's signature needed at discharge.  Patient to Follow up at: Follow-up Information   Follow up with Arna Medici On 12/24/2012. (Appointment scheduled at 8:00 am on this date, for hospital discharge appointment.  They will then schedule you for medication management and therapy.  Referral # [id number])    Contact information:   Physical Address: [address], [place], [place] [address] Phone: [phone] Fax: [phone]  Mailing Address: [address], [place], [place] [address]      Patient denies SI/HI:   Yes,  denies SI/HI    Safety Planning and Suicide Prevention discussed:  Yes,  discussed with pt and pt's daughter.  See suicide prevention education note.   Carmina Miller 12/22/2012, 10:18 AM

## 2012-12-22 NOTE — Discharge Summary (Signed)
Physician Discharge Summary Note  Patient:  Anthony [name] is an 62 y.o., male MRN:  [id number] DOB:  February 11, 1950 Patient phone:  [phone] (home)  Patient address:   [address] [place] [address],   Date of Admission:  12/16/2012 Date of Discharge: 12/22/2012  Reason for Admission:  Depression with suicidal ideations and plan  Discharge Diagnoses: Active Problems:   Major psychotic depression, recurrent  Review of Systems  Constitutional: Negative.   HENT: Negative.   Eyes: Negative.   Respiratory: Negative.   Cardiovascular: Negative.   Gastrointestinal: Negative.   Genitourinary: Negative.   Musculoskeletal: Negative.   Skin: Negative.   Neurological: Negative.   Endo/Heme/Allergies: Negative.   Psychiatric/Behavioral: The patient is nervous/anxious.     DSM5:  Depressive Disorders:  Major Depressive Disorder - Severe (296.23)  Axis Diagnosis:   AXIS I:  Major Depression, Recurrent severe AXIS II:  Deferred AXIS III:   Past Medical History  Diagnosis Date  . CAD (coronary artery disease)     a. INF STEMI 07/04/10:  tx with thrombectomy + [facility] to [facility];  cath 07/04/10: dLM 10-20%, pLAD 40-50%, mLAD 20-30%, pRCA 30%, mRCA occluded and tx with PCI, EF 50% with inf HK. A Multilink  . HLD (hyperlipidemia)   . COPD (chronic obstructive pulmonary disease)   . Tobacco abuse   . Bipolar disorder   . History of DVT (deep vein thrombosis)     traumatic, s/p coumadin tx.  . Glucose intolerance (impaired glucose tolerance)     A1c 6.2 06/2010  . Depression    AXIS IV:  other psychosocial or environmental problems, problems related to social environment and problems with primary support group AXIS V:  41-50 serious symptoms  Level of Care:  OP  Hospital Course:  On admission:  62 year old while male [facility] for symptoms of depression and suicidal ideation with the plan to overdose on his  blood thinner medication and aspirin or by cutting his ankles and wrists. Patient reports that he is not able to contract for safety. Patient has a history of suicidal attempt in 1995 when he took an overdose on sleeping pills. Patient has increased feelings of hopelessness and depression. Patient reports a previous diagnosis of Bipolar and Major Depressive Disorder, Recurrent. Patient denies prior psychiatric hospitalization. Patient reports medication management previously at [facility]. Patient reports that he has not been taking his medication as prescribed. Patient was not able to tell me the last time that he has taken his medication. Patient denies substance abuse.   During hospitalization:  Medications managed--Cardiac and coagulation medications continued during inpatient.  Wellbutrin 150 mg daily for depression, Vistaril 25 mg TID and 50 mg at bedtime for anxiety started.  Anthony [name] attended and participated in therapy.  He denied suicidal/homicidal ideations and auditory/visual hallucinations, follow-up appointments encouraged to attend, outside support groups encouraged and information given, Rx given and 14 day supply of medications.  Anthony [name] is mentally and physically stable for discharge.  Consults:  None  Significant Diagnostic Studies:  labs: completed, reviewed, stable  Discharge Vitals:   Blood pressure 132/91, pulse 70, temperature 97.3 F (36.3 C), temperature source Oral, resp. rate 16, height 5\' 10"  (1.778 m), SpO2 93.00%. There is no weight on file to calculate BMI. Lab Results:   No results found for this or any previous visit (from the past 72 hour(s)).  Physical Findings: AIMS: Facial and Oral Movements Muscles of Facial Expression: None,  normal Lips and Perioral Area: None, normal Jaw: None, normal Tongue: None, normal,Extremity Movements Upper (arms, wrists, hands, fingers): None, normal Lower (legs, knees, ankles, toes): None, normal, Trunk Movements Neck, shoulders,  hips: None, normal, Overall Severity Severity of abnormal movements (highest score from questions above): None, normal Incapacitation due to abnormal movements: None, normal Patient's awareness of abnormal movements (rate only patient's report): No Awareness, Dental Status Current problems with teeth and/or dentures?: No Does patient usually wear dentures?: No  CIWA:    COWS:     Psychiatric Specialty Exam: See Psychiatric Specialty Exam and Suicide Risk Assessment completed by Attending Physician prior to discharge.  Discharge destination:  Home  Is patient on multiple antipsychotic therapies at discharge:  No   Has Patient had three or more failed trials of antipsychotic monotherapy by history:  No  Recommended Plan for Multiple Antipsychotic Therapies: NA  Discharge Orders   Future Orders Complete By Expires   Diet - low sodium heart healthy  As directed    Discharge instructions  As directed    Comments:     Take all of your medications as directed. Be sure to keep all of your follow up appointments.  If you are unable to keep your follow up appointment, call your Doctor's office to let them know, and reschedule.  Make sure that you have enough medication to last until your appointment. Be sure to get plenty of rest. Going to bed at the same time each night will help. Try to avoid sleeping during the day.  Increase your activity as tolerated. Regular exercise will help you to sleep better and improve your mental health. Eating a heart healthy diet is recommended. Try to avoid salty or fried foods. Be sure to avoid all alcohol and illegal drugs.   Increase activity slowly  As directed        Medication List    STOP taking these medications       vitamin B-12 1000 MCG tablet  Commonly known as:  CYANOCOBALAMIN      TAKE these medications     Indication   aspirin EC 81 MG tablet  Take 1 tablet (81 mg total) by mouth every morning. For platelet aggregation.   Indication:   Blood Clot     buPROPion 150 MG 24 hr tablet  Commonly known as:  WELLBUTRIN XL  Take 1 tablet (150 mg total) by mouth daily.   Indication:  Major Depressive Disorder     hydrOXYzine 25 MG tablet  Commonly known as:  ATARAX/VISTARIL  Take 1 tablet (25 mg total) by mouth 3 (three) times daily. For anxiety.   Indication:  Anxiety Neurosis     lovastatin 40 MG tablet  Commonly known as:  MEVACOR  Take 1 tablet (40 mg total) by mouth at bedtime. Cholesterol.   Indication:  Type II A Hyperlipidemia     metoprolol tartrate 25 MG tablet  Commonly known as:  LOPRESSOR  Take 0.5 tablets (12.5 mg total) by mouth 2 (two) times daily. For anxiety.   Indication:  Feeling Anxious     naproxen sodium 220 MG tablet  Commonly known as:  ANAPROX  Take 1 tablet (220 mg total) by mouth 2 (two) times daily as needed (pain).      Rivaroxaban 20 MG Tabs tablet  Commonly known as:  XARELTO  Take 1 tablet (20 mg total) by mouth daily with supper.   Indication:  Thromboembolism secondary to Atrial Fibrillation  Follow-up Information   Follow up with Arna Medici On 12/24/2012. (Appointment scheduled at 8:00 am on this date, for hospital discharge appointment.  They will then schedule you for medication management and therapy.  Referral # [id number])    Contact information:   Physical Address: [address], [place], [place] [address] Phone: [phone] Fax: [phone]  Mailing Address: [address], [place], [place] [address]      Follow-up recommendations:  Activity:  as tolerated Diet:  low-sodium heart healthy diet  Comments:  Patient will continued his care at [facility].  Total Discharge Time:  Greater than 30 minutes.  SignedNanine Means, PMH-NP 12/22/2012, 11:06 AM  Patient was seen for psychiatric evaluation, suicide risk assessment and case discussed with physician extender and formulated treatment plan and also developed disposition plan after discussed with the treatment team.  Reviewed the information documented and agree with the treatment plan.  Deanndra Kirley,JANARDHAHA R. 12/22/2012 2:30 PM

## 2012-12-22 NOTE — Progress Notes (Signed)
Patient ID: Anthony [name], male   DOB: February 03, 1950, 62 y.o.   MRN: [id number] Discharge Note:   Patient denies HI and A/V hallucinations. Patient is passive for SI but verbally contracts for safety. Patient states, "I just have thoughts but it's getting better."  Belongings returned to patient at time of discharge. Patient denies any pain or discomfort upon discharge. Discharge instructions and medications were reviewed with patient. Patient verbalized understanding of both medications and discharge instructions. Patient was seen in the milieu and going to groups earlier in the day. Patient filled out patient satisfaction survey. Q15 minute safety checks were maintained until discharge.

## 2012-12-22 NOTE — [facility] Suicide Risk Assessment (Signed)
Suicide Risk Assessment  Discharge Assessment     Demographic Factors:  Male, Adolescent or young adult, Caucasian, Low socioeconomic status and Unemployed  Mental Status Per Nursing Assessment::   On Admission:     Current Mental Status by Physician: Mental Status Examination: Patient appeared as per his stated age, casually dressed, and fairly groomed, and maintaining good eye contact. Patient has good mood and his affect was constricted. He has normal rate, rhythm, and volume of speech. His thought process is linear and goal directed. Patient has denied suicidal, homicidal ideations, intentions or plans. Patient has no evidence of auditory or visual hallucinations, delusions, and paranoia. Patient has fair insight judgment and impulse control.  Loss Factors: None significant except chronic medical problems  Historical Factors: Prior suicide attempts, Family history of mental illness or substance abuse and Impulsivity  Risk Reduction Factors:   Sense of responsibility to family, Religious beliefs about death, Living with another person, especially a relative, Positive social support, Positive therapeutic relationship and Positive coping skills or problem solving skills  Continued Clinical Symptoms:  Depression:   Recent sense of peace/wellbeing Previous Psychiatric Diagnoses and Treatments Medical Diagnoses and Treatments/Surgeries  Cognitive Features That Contribute To Risk:  Polarized thinking    Suicide Risk:  Minimal: No identifiable suicidal ideation.  Patients presenting with no risk factors but with morbid ruminations; may be classified as minimal risk based on the severity of the depressive symptoms  Discharge Diagnoses:   AXIS I:  Major Depression, Recurrent severe AXIS II:  Deferred AXIS III:   Past Medical History  Diagnosis Date  . CAD (coronary artery disease)     a. INF STEMI 07/04/10:  tx with thrombectomy + [facility]MS to [facility];  cath 07/04/10: dLM 10-20%, pLAD  40-50%, mLAD 20-30%, pRCA 30%, mRCA occluded and tx with PCI, EF 50% with inf HK. A Multilink  . HLD (hyperlipidemia)   . COPD (chronic obstructive pulmonary disease)   . Tobacco abuse   . Bipolar disorder   . History of DVT (deep vein thrombosis)     traumatic, s/p coumadin tx.  . Glucose intolerance (impaired glucose tolerance)     A1c 6.2 06/2010  . Depression    AXIS IV:  economic problems, other psychosocial or environmental problems, problems related to social environment and problems with primary support group AXIS V:  61-70 mild symptoms  Plan Of Care/Follow-up recommendations:  Activity:  As directed Diet:  Regular  Is patient on multiple antipsychotic therapies at discharge:  No   Has Patient had three or more failed trials of antipsychotic monotherapy by history:  No  Recommended Plan for Multiple Antipsychotic Therapies: NA  Raynell Upton,JANARDHAHA R. 12/22/2012, 12:08 PM

## 2012-12-22 NOTE — Progress Notes (Signed)
Patient ID: Anthony [name], male   DOB: 1950-12-28, 62 y.o.   MRN: [id number] D)  Has been out and about on the hall this evening, attended group, pleasant, appropriate.  Requested something for sleep, NP made aware as trazodone has been d/c'd.  Stated had had problems sleeping mostly because of his roommate, snoring, other noises.  States feeling a little better but tired, still occasional thought of suicide but contracts for safety. A)  Will continue to monitor for safety, continue POC R)  Safety maintained.   Addenddum:  01:10 came to med window to ask about sleep med, NP notified of request, order put in for vistaril 50 mg for sleep, was given, pt in bed resting.

## 2012-12-22 NOTE — Tx Team (Signed)
Interdisciplinary Treatment Plan Update (Adult)  Date: 12/22/2012  Time Reviewed:  9:45 AM  Progress in Treatment: Attending groups: Yes Participating in groups:  Yes Taking medication as prescribed:  Yes Tolerating medication:  Yes Family/Significant othe contact made: Yes, with pt's daughter  Patient understands diagnosis:  Yes Discussing patient identified problems/goals with staff:  Yes Medical problems stabilized or resolved:  Yes Denies suicidal/homicidal ideation: Yes Issues/concerns per patient self-inventory:  Yes Other:  New problem(s) identified: N/A  Discharge Plan or Barriers: Pt will follow up at [facility] for medication management and therapy  Reason for Continuation of Hospitalization: Stable to d/c today  Comments: N/A  Estimated length of stay: D/C today  For review of initial/current patient goals, please see plan of care.  Attendees: Patient:  Anthony [name] 12/22/2012 10:18 AM   Family:     Physician:  Dr. Javier Glazier 12/22/2012 10:18 AM   Nursing:   Marzetta Board, RN 12/22/2012 10:18 AM   Clinical Social Worker:  Reyes Ivan, LCSW 12/22/2012 10:18 AM   Other: Verne Spurr, PA 12/22/2012 10:18 AM   Other:  Quintella Reichert, RN 12/22/2012 10:18 AM   Other:  Juline Patch, LCSW 12/22/2012 10:18 AM   Other:  Neill Loft, RN 12/22/2012 10:18 AM   Other:    Other:    Other:    Other:    Other:      Scribe for Treatment Team:   Carmina Miller, 12/22/2012 , 10:18 AM

## 2012-12-25 NOTE — Progress Notes (Signed)
Patient Discharge Instructions:  After Visit Summary (AVS):   Faxed to:  12/25/12 Discharge Summary Note:   Faxed to:  12/25/12 Psychiatric Admission Assessment Note:   Faxed to:  12/25/12 Suicide Risk Assessment - Discharge Assessment:   Faxed to:  12/25/12 Faxed/Sent to the Next Level Care provider:  12/25/12 Faxed to [facility] @ [phone]  Jerelene Redden, 12/25/2012, 4:00 PM

## 2013-05-05 ENCOUNTER — Emergency Department (HOSPITAL_COMMUNITY): Payer: Self-pay

## 2013-05-05 ENCOUNTER — Emergency Department (HOSPITAL_COMMUNITY)
Admission: EM | Admit: 2013-05-05 | Discharge: 2013-05-05 | Disposition: A | Discharge status: Home / Self Care | Payer: Self-pay | Attending: Emergency Medicine | Admitting: Emergency Medicine

## 2013-05-05 ENCOUNTER — Encounter (HOSPITAL_COMMUNITY): Payer: Self-pay | Admitting: Emergency Medicine

## 2013-05-05 DIAGNOSIS — Z7901 Long term (current) use of anticoagulants: Secondary | ICD-10-CM | POA: Insufficient documentation

## 2013-05-05 DIAGNOSIS — F17200 Nicotine dependence, unspecified, uncomplicated: Secondary | ICD-10-CM | POA: Insufficient documentation

## 2013-05-05 DIAGNOSIS — Z880 Allergy status to penicillin: Secondary | ICD-10-CM | POA: Insufficient documentation

## 2013-05-05 DIAGNOSIS — K4090 Unilateral inguinal hernia, without obstruction or gangrene, not specified as recurrent: Secondary | ICD-10-CM | POA: Insufficient documentation

## 2013-05-05 DIAGNOSIS — J4489 Other specified chronic obstructive pulmonary disease: Secondary | ICD-10-CM | POA: Insufficient documentation

## 2013-05-05 DIAGNOSIS — Z86718 Personal history of other venous thrombosis and embolism: Secondary | ICD-10-CM | POA: Insufficient documentation

## 2013-05-05 DIAGNOSIS — J449 Chronic obstructive pulmonary disease, unspecified: Secondary | ICD-10-CM | POA: Insufficient documentation

## 2013-05-05 DIAGNOSIS — Z791 Long term (current) use of non-steroidal anti-inflammatories (NSAID): Secondary | ICD-10-CM | POA: Insufficient documentation

## 2013-05-05 DIAGNOSIS — F319 Bipolar disorder, unspecified: Secondary | ICD-10-CM | POA: Insufficient documentation

## 2013-05-05 DIAGNOSIS — E785 Hyperlipidemia, unspecified: Secondary | ICD-10-CM | POA: Insufficient documentation

## 2013-05-05 DIAGNOSIS — Z79899 Other long term (current) drug therapy: Secondary | ICD-10-CM | POA: Insufficient documentation

## 2013-05-05 DIAGNOSIS — Z7982 Long term (current) use of aspirin: Secondary | ICD-10-CM | POA: Insufficient documentation

## 2013-05-05 DIAGNOSIS — Z9861 Coronary angioplasty status: Secondary | ICD-10-CM | POA: Insufficient documentation

## 2013-05-05 DIAGNOSIS — I2510 Atherosclerotic heart disease of native coronary artery without angina pectoris: Secondary | ICD-10-CM | POA: Insufficient documentation

## 2013-05-05 LAB — URINALYSIS, ROUTINE W REFLEX MICROSCOPIC
Bilirubin Urine: NEGATIVE
Glucose, UA: NEGATIVE mg/dL
HGB URINE DIPSTICK: NEGATIVE
Ketones, ur: NEGATIVE mg/dL
Leukocytes, UA: NEGATIVE
Nitrite: NEGATIVE
Protein, ur: NEGATIVE mg/dL
Specific Gravity, Urine: 1.006 (ref 1.005–1.030)
Urobilinogen, UA: 0.2 mg/dL (ref 0.0–1.0)
pH: 6.5 (ref 5.0–8.0)

## 2013-05-05 LAB — HEPATIC FUNCTION PANEL
ALK PHOS: 59 U/L (ref 39–117)
ALT: 62 U/L — ABNORMAL HIGH (ref 0–53)
AST: 52 U/L — ABNORMAL HIGH (ref 0–37)
Albumin: 4.2 g/dL (ref 3.5–5.2)
TOTAL PROTEIN: 7.4 g/dL (ref 6.0–8.3)
Total Bilirubin: 0.8 mg/dL (ref 0.3–1.2)

## 2013-05-05 LAB — I-STAT CG4 LACTIC ACID, ED: Lactic Acid, Venous: 0.95 mmol/L (ref 0.5–2.2)

## 2013-05-05 LAB — CBC WITH DIFFERENTIAL/PLATELET
Basophils Absolute: 0 10*3/uL (ref 0.0–0.1)
Basophils Relative: 0 % (ref 0–1)
Eosinophils Absolute: 0.1 10*3/uL (ref 0.0–0.7)
Eosinophils Relative: 1 % (ref 0–5)
HCT: 46.3 % (ref 39.0–52.0)
Hemoglobin: 16.5 g/dL (ref 13.0–17.0)
LYMPHS ABS: 2.8 10*3/uL (ref 0.7–4.0)
LYMPHS PCT: 26 % (ref 12–46)
MCH: 31.5 pg (ref 26.0–34.0)
MCHC: 35.6 g/dL (ref 30.0–36.0)
MCV: 88.5 fL (ref 78.0–100.0)
Monocytes Absolute: 1 10*3/uL (ref 0.1–1.0)
Monocytes Relative: 10 % (ref 3–12)
NEUTROS PCT: 62 % (ref 43–77)
Neutro Abs: 6.5 10*3/uL (ref 1.7–7.7)
Platelets: 185 10*3/uL (ref 150–400)
RBC: 5.23 MIL/uL (ref 4.22–5.81)
RDW: 12.5 % (ref 11.5–15.5)
WBC: 10.5 10*3/uL (ref 4.0–10.5)

## 2013-05-05 LAB — I-STAT CHEM 8, ED
BUN: 8 mg/dL (ref 6–23)
CREATININE: 1.1 mg/dL (ref 0.50–1.35)
Calcium, Ion: 1.16 mmol/L (ref 1.13–1.30)
Chloride: 102 mEq/L (ref 96–112)
Glucose, Bld: 116 mg/dL — ABNORMAL HIGH (ref 70–99)
HCT: 50 % (ref 39.0–52.0)
Hemoglobin: 17 g/dL (ref 13.0–17.0)
POTASSIUM: 4 meq/L (ref 3.7–5.3)
SODIUM: 139 meq/L (ref 137–147)
TCO2: 22 mmol/L (ref 0–100)

## 2013-05-05 LAB — LIPASE, BLOOD: LIPASE: 29 U/L (ref 11–59)

## 2013-05-05 LAB — PROTIME-INR
INR: 1.1 (ref 0.00–1.49)
PROTHROMBIN TIME: 14 s (ref 11.6–15.2)

## 2013-05-05 MED ORDER — MORPHINE SULFATE 4 MG/ML IJ SOLN
4.0000 mg | Freq: Once | INTRAMUSCULAR | Status: AC
  Administered 2013-05-05: 4 mg via INTRAVENOUS
  Filled 2013-05-05: qty 1

## 2013-05-05 MED ORDER — IOHEXOL 300 MG/ML  SOLN
50.0000 mL | Freq: Once | INTRAMUSCULAR | Status: AC | PRN
  Administered 2013-05-05: 50 mL via ORAL

## 2013-05-05 MED ORDER — HYDROCODONE-ACETAMINOPHEN 5-325 MG PO TABS: ORAL_TABLET | ORAL | Status: DC

## 2013-05-05 MED ORDER — IOHEXOL 300 MG/ML  SOLN
100.0000 mL | Freq: Once | INTRAMUSCULAR | Status: AC | PRN
  Administered 2013-05-05: 100 mL via INTRAVENOUS

## 2013-05-05 MED ORDER — ONDANSETRON HCL 4 MG/2ML IJ SOLN
4.0000 mg | Freq: Once | INTRAMUSCULAR | Status: AC
  Administered 2013-05-05: 4 mg via INTRAVENOUS
  Filled 2013-05-05: qty 2

## 2013-05-05 NOTE — ED Provider Notes (Signed)
7:48 AM Handoff from Rona Ravens PA-C/Dr. Sabra Heck at shift change pending CT abd.   Patient with known reducible L inguinal hernia with worsening pain. Reducible on exam here. CT ordered to r/o other cause of pain. Patient reports pain radiating around pelvis to buttocks.   CT reviewed. Shows bilateral fat containing hernias. Tender on exam, but no redness or exquisite pain. Pt informed of all results. Will rx pain meds, surgery f/u. Also discussed support of area, ice.   Discussed signs of strangulation and incarceration and need to return with these.   Patient counseled on use of narcotic pain medications. Counseled not to combine these medications with others containing tylenol. Urged not to drink alcohol, drive, or perform any other activities that requires focus while taking these medications. The patient verbalizes understanding and agrees with the plan.   Carlisle Cater, PA-C 05/05/13 [phone]

## 2013-05-05 NOTE — ED Provider Notes (Signed)
Medical screening examination/treatment/procedure(s) were conducted as a shared visit with non-physician practitioner(s) and myself.  I personally evaluated the patient during the encounter  Please see my separate respective documentation pertaining to this patient encounter   Johnna Acosta, MD 05/05/13 [phone]

## 2013-05-05 NOTE — ED Notes (Signed)
Pt made aware of need for urine.  Pt unable to provide one at this time. Pt will notify staff when he has provided a sample.

## 2013-05-05 NOTE — ED Provider Notes (Signed)
63 year old male who presents with a complaint of abdominal discomfort and left inguinal pain. This is not a new problem, it has been going on for some time in fact he has recently had an ultrasound of the scrotum that confirmed an inguinal hernia present in the scrotum. He has not had nausea vomiting fevers chills or lightheadedness, he does have some component of mild low back pain and a feeling of discomfort in his bilateral legs especially when he coughs. He has no weakness or numbness, no difficulty urinating, no diarrhea. He has been referred for surgical repair of his hernia but told to come to the hospital, not the surgeon's office.  On my exam the patient has a soft nontender abdomen with a left inguinal hernia which is present in the inguinal canal into the left hemiscrotum. This is reducible but immediately goes back into the scrotum upon release. There is no tenderness, no redness, minimal swelling. He has no tenderness on palpation of his back, clear heart and lung sounds. The patient is on anticoagulation secondary to a prior blood clot as well as a prior myocardial infarction.  Due to the presence and worsening symptoms of this hernia imaging will be performed to rule out strangulation or other complications related to his hernia.  Medical screening examination/treatment/procedure(s) were conducted as a shared visit with non-physician practitioner(s) and myself.  I personally evaluated the patient during the encounter.        Johnna Acosta, MD 05/05/13 0530

## 2013-05-05 NOTE — ED Notes (Signed)
Pt complains of abd pain for several days, he states he was dx with a hernia, no vomiting and no diarrhea

## 2013-05-05 NOTE — Discharge Instructions (Signed)
Please read and follow all provided instructions.  Your diagnoses today include:  1. Inguinal hernia     Tests performed today include:  Blood counts and electrolytes - normal  Blood tests to check liver and kidney function - normal  Urine test to look for infection - normal  CT scan - shows a large left inguinal hernia containing fat and a smaller one on the right, no other problems  Vital signs. See below for your results today.   Medications prescribed:   Vicodin (hydrocodone/acetaminophen) - narcotic pain medication  DO NOT drive or perform any activities that require you to be awake and alert because this medicine can make you drowsy. BE VERY CAREFUL not to take multiple medicines containing Tylenol (also called acetaminophen). Doing so can lead to an overdose which can damage your liver and cause liver failure and possibly death.  Take any prescribed medications only as directed.  Home care instructions:   Follow any educational materials contained in this packet.  Follow-up instructions: Please follow-up with the surgeon listed in the next 3 days for further evaluation of your symptoms. If you do not have a primary care doctor -- see below for referral information.   Return instructions:  SEEK IMMEDIATE MEDICAL ATTENTION IF:  The pain does not go away or becomes severe   A temperature above 101F develops   Repeated vomiting occurs (multiple episodes)   The pain becomes localized to portions of the abdomen. The right side could possibly be appendicitis. In an adult, the left lower portion of the abdomen could be colitis or diverticulitis.   Blood is being passed in stools or vomit (bright red or black tarry stools)   You develop chest pain, difficulty breathing, dizziness or fainting, or become confused, poorly responsive, or inconsolable (young children)  If you have any other emergent concerns regarding your health  Additional Information: Abdominal (belly)  pain can be caused by many things. Your caregiver performed an examination and possibly ordered blood/urine tests and imaging (CT scan, x-rays, ultrasound). Many cases can be observed and treated at home after initial evaluation in the emergency department. Even though you are being discharged home, abdominal pain can be unpredictable. Therefore, you need a repeated exam if your pain does not resolve, returns, or worsens. Most patients with abdominal pain don't have to be admitted to the hospital or have surgery, but serious problems like appendicitis and gallbladder attacks can start out as nonspecific pain. Many abdominal conditions cannot be diagnosed in one visit, so follow-up evaluations are very important.  Your vital signs today were: BP 136/83   Pulse 77   Temp(Src) 98 F (36.7 C) (Oral)   Resp 20   Ht 5\' 11"  (1.803 m)   SpO2 96% If your blood pressure (bp) was elevated above 135/85 this visit, please have this repeated by your doctor within one month. --------------  Emergency Department Resource Guide 1) Find a Doctor and Pay Out of Pocket Although you won't have to find out who is covered by your insurance plan, it is a good idea to ask around and get recommendations. You will then need to call the office and see if the doctor you have chosen will accept you as a new patient and what types of options they offer for patients who are self-pay. Some doctors offer discounts or will set up payment plans for their patients who do not have insurance, but you will need to ask so you aren't surprised when you get to  your appointment.  2) Contact Your Local Health Department Not all health departments have doctors that can see patients for sick visits, but many do, so it is worth a call to see if yours does. If you don't know where your local health department is, you can check in your phone book. The CDC also has a tool to help you locate your state's health department, and many state websites also  have listings of all of their local health departments.  3) Find a [facility] If your illness is not likely to be very severe or complicated, you may want to try a walk in clinic. These are popping up all over the country in pharmacies, drugstores, and shopping centers. They're usually staffed by nurse practitioners or physician assistants that have been trained to treat common illnesses and complaints. They're usually fairly quick and inexpensive. However, if you have serious medical issues or chronic medical problems, these are probably not your best option.  No Primary Care Doctor: - Call Health Connect at  [phone] - they can help you locate a primary care doctor that  accepts your insurance, provides certain services, etc. - Physician Referral Service- [phone]  Chronic Pain Problems: Organization         Address  Phone   Notes  [facility]  [phone] Patients need to be referred by their primary care doctor.   Medication Assistance: Organization         Address  Phone   Notes  [facility] Medication [facility] [address] [place], [place] [address] [phone] --Must be a resident of [facility] -- Must have NO insurance coverage whatsoever (no Medicaid/ Medicare, etc.) -- The pt. MUST have a primary care doctor that directs their care regularly and follows them in the community   [facility]  [phone]   [employer]  [phone]    Agencies that provide inexpensive medical care: Organization         Address  Phone   Notes  [place]  [phone]   [facility]    [phone]   [facility] [address], [place] [address] [phone]   [place] [address], [place] [phone]   Planned Parenthood    [phone]   [facility]    [phone]   [place] and [place] [address], [place] Phone:  [phone], Fax:  [phone] Hours of Operation:  9 am - 6 pm, M-F.  Also accepts Medicaid/Medicare and self-pay.  [facility] for [place] [address], [address], [place] Phone: [phone], Fax: [phone]. Hours of Operation:  8:30 am - 5:30 pm, M-F.  Also accepts Medicaid and self-pay.  [facility] [place] [address], [place] Phone: [phone]   [place], [place], [place] [phone], Ext. [phone] Mondays & Thursdays: 7-9 AM.  First 15 patients are seen on a first come, first serve basis.    [place] Providers:  Organization         Address  Phone   Notes  [facility] [address], [place] [phone] Also accepts self-pay patients.  [place], [place], [place]  [phone]   [place]  [place], [place] [phone]   [place] [address] [place] [phone]   Lucianne Lei [address], [place]   [phone] Only accepts [place] Access [place] patients after they have their name applied to their card.   Self-Pay (no insurance) in [facility]:  Organization         Address  Phone   Notes  Sickle Cell Patients, [facility] [place] [phone]   [facility] [place] [phone]   [facility] [place]  [place], [place], [place] [phone]   [facility]/Dr. Osei-Bonsu  [address], [place] or [address], [place] [phone] Phone number for both [place] and [place] locations is the same.  Urgent Medical and [facility] [address], [place] [phone]   [facility] [address],  [place] or [address] [phone]   [facility] [address], [place] [phone], phone; [phone], fax Sees patients 1st and 3rd Saturday of every month.  Must not qualify for public or private insurance (i.e. Medicaid, Medicare, [place] Health Choice, Veterans' Benefits)  Household income should be no more than 200% of the poverty level The clinic cannot treat you if you are pregnant or think you are pregnant  Sexually transmitted diseases are not treated at the clinic.    Dental Care: Organization         Address  Phone  Notes  [facility] Department of [facility] [place] [phone] Accepts children up to age 43 who are enrolled in [place] or [place]; pregnant women with a Medicaid card; and children who have applied for Medicaid or [place] Health Choice, but were declined, whose parents can pay a reduced fee at time of service.  [facility] Department of [facility]  [address], [place] [phone] Accepts children up to age 21 who are enrolled in [place] or [place]; pregnant women with a Medicaid card; and children who have applied for Medicaid or [place] Health Choice, but were declined, whose parents can pay a reduced fee at time of service.  [place] Adult [facility] Access PROGRAM  [place] [phone] Patients are seen by appointment only. Walk-ins are not accepted. [place] will see patients 42 years of age and older. Monday - Tuesday (8am-5pm) Most Wednesdays (8:30-5pm) $30 per visit, cash only  [facility] Adult Dental Access PROGRAM  [address], [facility] [phone] Patients are seen by appointment only. Walk-ins are not accepted. [place] will see patients 53 years of age and older. One Wednesday Evening (Monthly: Volunteer Based).  $30 per visit, cash only  [place]  [phone] for adults; Children under age 20, call Graduate Pediatric Dentistry at [phone]. Children aged 70-14, please call [phone] to request a pediatric application.  Dental services are provided in all areas of dental care including fillings, crowns and bridges, complete and partial dentures, implants, gum treatment, root canals, and extractions. Preventive care is also provided. Treatment is provided to both adults and children. Patients are selected via a lottery and there is often a waiting list.   [facility] [phone]  [address], [place]  [phone] [email or web address]   [facility] [address] [place], [place] [phone], Ext. [phone] Second and Fourth Thursday of each month, opens at 6:30 AM; Clinic ends at 9 AM.  Patients are seen on a first-come first-served basis, and a limited number are seen during each clinic.   [facility]  [address] Hillard Danker [place], [place] [phone]   Eligibility Requirements You must have lived in [place], [place], or [place] counties for at least the last three months.   You cannot be eligible for state or federal sponsored [employer], including [employer], [place], or [employer].   You generally cannot be eligible for healthcare insurance through your employer.    How to apply: Eligibility screenings are held every Tuesday and Wednesday afternoon from 1:00 pm until 4:00 pm. You do not need an appointment for the interview!  [facility] [address] [place], [place]   [place]  [place] Department  [place]  [phone]    Behavioral Health Resources in the Community: Intensive Outpatient Programs Organization         Address  Phone  Notes  [place]. [address], [place], [place] [phone]     [facility] Outpatient [address] [place], [place]   ADS: Alcohol & Drug Svcs [address], [place], [place]   [place] [address],  [place], [place] or [phone]   Substance Abuse Resources Organization         Address  Phone  Notes  Alcohol and Drug Services  [phone]   [place]  [phone]   [facility] [place]   Chinita Pester  [phone]   Residential & Outpatient Substance Abuse Program  [phone]   Psychological Services Organization         Address  Phone  Notes  [facility] [place]  [place]  [phone]   [place] [address], [place] or [phone]    Mobile Crisis Teams Organization         Address  Phone  Notes  [facility], Mobile Crisis Care Unit  [phone]   [facility]  [address] [place], [place]   [place] [address], [place] [phone]    Self-Help/Support Groups Organization         Address  Phone             Notes  [place]. of [place] - variety of support groups  [place] Call for more information  Narcotics Anonymous (NA), [facility] [address], [employer]   2 meetings at this location   Special educational needs teacher         Address  Phone  Notes  [facility] Residential Treatment [place],    [place]  [phone]   [facility]  [address] [place] [address], [place], [place]   [place], [place] [phone] Admissions: 8am-3pm M-F  Incentives Substance [place] [address]    [place], [place] [phone]   [facility] [address] Jadene Pierini [place], [place]   The [place].,  [place],  [place] - Intensive Outpatient [address] Kristeen [address], [place], [place]   [facility] ([place].,  [place], [place] [phone] or [phone]   [facility]idential Treatment Services (RTS) [address] [place], [place] Accepts Medicaid  [facility] [place] [address],  [place] [phone] Substance Abuse/Addiction Treatment   [facility] Organization         Address  Phone  Notes  [facility]  [phone]   Domenic Schwab, PhD [address] Arlis Porta [place], [place]   [phone] or [phone]   [place], [place] [phone]   [address], [place], [place] [phone] Insurance/Medicaid/sponsorship through [facility] and Families [address] [place]                                    [place], [place] [phone] Regino Ramirez [address], [place] [phone]    Dr. Adele Schilder  [phone]   [facility] Dept. 1) [address] [place] 2) [place] 3)  [place] [address], [place] [phone]  [phone]   [place] [phone] or [phone] (After Hours)

## 2013-05-05 NOTE — ED Provider Notes (Signed)
CSN: [id number]     Arrival date & time 05/05/13  0358 History   First MD Initiated Contact with Patient 05/05/13 0406     Chief Complaint  Patient presents with  . Abdominal Pain     (Consider location/radiation/quality/duration/timing/severity/associated sxs/prior Treatment) HPI  63 year old male with history of left inguinal hernia, history of CAD, COPD, and bipolar disorder who presents complaining of abdominal pain. Patient is a poor historian. Patient's initial complaint is pain to his buttocks which radiates down to both thigh ongoing for the past 6-7 months. Pain is described as a sharp and aching sensation, persistent, worsening with movement and not improved with taking ibuprofen. For the past 3 days it seems that the pain has intensified. No associated numbness or weakness. He also mentioned that he has history of left hernia that was diagnosed several months ago. Denies any changes in his hernia and states that it's always the same which she described as a swollen scrotum but no significant pain. He mentioned that he was told by his primary care doctor to be seen in the ER while month ago due to his hernia and that's why he is here today. When asked how severe his his pain he rated as 9/10 but unable to specify where it hurts the most. Otherwise patient denies fever, chills, chest pain, shortness of breath, productive cough, dysuria, hematuria,, he or melena. He does report occasional sensation of nausea without vomiting. He did complain of having constipation for the past several weeks. Last bowel movement was today but reports only a small amount.   Past Medical History  Diagnosis Date  . CAD (coronary artery disease)     a. INF STEMI 07/04/10:  tx with thrombectomy + [facility] to [place] [facility];  cath 07/04/10: dLM 10-20%, pLAD 40-50%, mLAD 20-30%, pRCA 30%, mRCA occluded and tx with PCI, EF 50% with inf HK. A Multilink  . HLD (hyperlipidemia)   . COPD (chronic obstructive pulmonary disease)    . Tobacco abuse   . Bipolar disorder   . History of DVT (deep vein thrombosis)     traumatic, s/p coumadin tx.  . Glucose intolerance (impaired glucose tolerance)     A1c 6.2 06/2010  . Depression    Past Surgical History  Procedure Laterality Date  . Electrocardiogram      Showed inferolateral ST-elevation and a code STEMI was activated. In the ER, he was treated with morphine, herarin, and 600 mg of Plavix. [facility] was [facility] emerg[facility] to [facility].   . Stent placement     Family History  Problem Relation Age of Onset  . Coronary artery disease Father    History  Substance Use Topics  . Smoking status: Current Every Day Smoker -- 2.00 packs/day    Types: Cigarettes  . Smokeless tobacco: Current User  . Alcohol Use: No     Comment: occasionally    Review of Systems  All other systems reviewed and are negative.     Allergies  Penicillins  Home Medications   Prior to Admission medications   Medication Sig Start Date End Date Taking? Authorizing Provider  aspirin EC 81 MG tablet Take 1 tablet (81 mg total) by mouth every morning. For platelet aggregation. 12/22/12   Nena Polio, PA-C  buPROPion (WELLBUTRIN XL) 150 MG 24 hr tablet Take 1 tablet (150 mg total) by mouth daily. 12/22/12   Nena Polio, PA-C  hydrOXYzine (ATARAX/VISTARIL) 25 MG tablet Take 1 tablet (25 mg total) by mouth 3 (  three) times daily. For anxiety. 12/22/12   Nena Polio, PA-C  lovastatin (MEVACOR) 40 MG tablet Take 1 tablet (40 mg total) by mouth at bedtime. Cholesterol. 12/22/12   Nena Polio, PA-C  metoprolol tartrate (LOPRESSOR) 25 MG tablet Take 0.5 tablets (12.5 mg total) by mouth 2 (two) times daily. For anxiety. 12/22/12   Nena Polio, PA-C  naproxen sodium (ANAPROX) 220 MG tablet Take 1 tablet (220 mg total) by mouth 2 (two) times daily as needed (pain). 12/22/12   Nena Polio, PA-C  Rivaroxaban (XARELTO) 20 MG TABS tablet Take 1 tablet (20 mg total) by mouth daily with  supper. 12/22/12   Nena Polio, PA-C   BP 136/83  Pulse 80  Temp(Src) 98 F (36.7 C) (Oral)  Resp 20  Ht 5\' 11"  (1.803 m)  SpO2 97% Physical Exam  Constitutional: He is oriented to person, place, and time. He appears well-developed and well-nourished. No distress.  HENT:  Head: Atraumatic.  Eyes: Conjunctivae are normal.  Neck: Normal range of motion. Neck supple.  Cardiovascular: Normal rate, regular rhythm and intact distal pulses.   Pulmonary/Chest: Effort normal and breath sounds normal.  Abdominal: Soft. There is tenderness. There is no rebound and no guarding.  Protuberant abdomen that is soft with mild tenderness to epigastrium, no guarding, no rebound tenderness  Genitourinary:  No CVA tenderness  Left inguinal hernia noted without any significant tenderness  Musculoskeletal:  No significant midline spine tenderness, crepitus, or step-off noted.  Bilateral lower extremities without palpable cords, erythema, edema, negative Homans sign.  Bilateral hip with normal flexion extension abduction and abduction  Neurological: He is alert and oriented to person, place, and time. He has normal reflexes.  Skin: No rash noted.  Psychiatric: He has a normal mood and affect.    ED Course  Procedures (including critical care time)  4:30 AM Pt here with multiple vague complaints.  Report having radicular back pain without any red flags. Unable to localized his back or thigh pain.  Negative straight leg raise. Is NVI. Has mild abd pain without peritoneal sign. Doubt AAA.  Has L inguinal hernia without significant pain and doubt incarceration or strangulation given the duration of his sxs.  Care discussed with Dr. Sabra Heck.    5:05 AM Dr. Sabra Heck has evaluated pt and recommend belly labs, abd/pelvis CT w/CM for further evaluation of his sxs.    6:05 AM Care discussed with oncoming provider who will continue care and dispo pending CT result.     Labs Review Labs Reviewed  HEPATIC  FUNCTION PANEL - Abnormal; Notable for the following:    AST 52 (*)    ALT 62 (*)    All other components within normal limits  I-STAT CHEM 8, ED - Abnormal; Notable for the following:    Glucose, Bld 116 (*)    All other components within normal limits  CBC WITH DIFFERENTIAL  LIPASE, BLOOD  PROTIME-INR  URINALYSIS, ROUTINE W REFLEX MICROSCOPIC  I-STAT CG4 LACTIC ACID, ED    Imaging Review No results found.   EKG Interpretation None      MDM   Final diagnoses:  None    BP 136/83  Pulse 80  Temp(Src) 98 F (36.7 C) (Oral)  Resp 20  Ht 5\' 11"  (1.803 m)  SpO2 97%     Domenic Moras, PA-C 05/05/13 [phone]

## 2013-05-06 NOTE — ED Provider Notes (Signed)
Medical screening examination/treatment/procedure(s) were conducted as a shared visit with non-physician practitioner(s) and myself.  I personally evaluated the patient during the encounter  Please see my separate respective documentation pertaining to this patient encounter   Johnna Acosta, MD 05/06/13 [phone]

## 2013-05-21 ENCOUNTER — Ambulatory Visit (INDEPENDENT_AMBULATORY_CARE_PROVIDER_SITE_OTHER): Payer: Self-pay | Admitting: General Surgery

## 2013-05-26 ENCOUNTER — Telehealth (INDEPENDENT_AMBULATORY_CARE_PROVIDER_SITE_OTHER): Payer: Self-pay | Admitting: General Surgery

## 2013-05-26 NOTE — Telephone Encounter (Signed)
LMOM for patient to call back and ask for Deneise Lever, want to see if he wants to move his apt up to 4 instead 4:30. Deneise Lever

## 2013-05-28 ENCOUNTER — Encounter (INDEPENDENT_AMBULATORY_CARE_PROVIDER_SITE_OTHER): Payer: Self-pay | Admitting: General Surgery

## 2013-05-28 ENCOUNTER — Ambulatory Visit (INDEPENDENT_AMBULATORY_CARE_PROVIDER_SITE_OTHER): Payer: Self-pay | Admitting: General Surgery

## 2013-05-28 ENCOUNTER — Other Ambulatory Visit (INDEPENDENT_AMBULATORY_CARE_PROVIDER_SITE_OTHER): Payer: Self-pay | Admitting: General Surgery

## 2013-05-28 VITALS — BP 116/72 | HR 80 | Temp 98.7°F | Resp 16 | Ht 70.0 in | Wt 204.6 lb

## 2013-05-28 DIAGNOSIS — K429 Umbilical hernia without obstruction or gangrene: Secondary | ICD-10-CM | POA: Insufficient documentation

## 2013-05-28 DIAGNOSIS — K4090 Unilateral inguinal hernia, without obstruction or gangrene, not specified as recurrent: Secondary | ICD-10-CM

## 2013-05-28 DIAGNOSIS — R911 Solitary pulmonary nodule: Secondary | ICD-10-CM

## 2013-05-28 DIAGNOSIS — K4020 Bilateral inguinal hernia, without obstruction or gangrene, not specified as recurrent: Secondary | ICD-10-CM

## 2013-05-28 NOTE — Patient Instructions (Signed)
Our office will contact yourr cardiologist to see if we can get you cleared for surgery and whether or not we can stop your blood thinner Once we obtain clearance our office will contact you to discuss surgery and scheduling surgery and potential financial options for paying for surgery  If the area becomes extremely painful and unbearable then the safest thing to do is to report to the emergency room

## 2013-05-29 ENCOUNTER — Encounter (INDEPENDENT_AMBULATORY_CARE_PROVIDER_SITE_OTHER): Payer: Self-pay | Admitting: General Surgery

## 2013-05-29 DIAGNOSIS — R911 Solitary pulmonary nodule: Secondary | ICD-10-CM | POA: Insufficient documentation

## 2013-05-29 NOTE — Progress Notes (Signed)
Patient ID: Anthony [name], male   DOB: 10-12-1950, 63 y.o.   MRN: [id number]  Chief Complaint  Patient presents with  . New Evaluation    inguinal hernia    HPI Anthony [name] is a 63 y.o. male.   HPI 63 year old Caucasian male referred by Dr. Noemi Chapel for evaluation of inguinal hernia. The patient states that he has had a left groin hernia for many years. He went to the emergency room for multiple vague complaints. A CT scan was performed which demonstrated bilateral inguinal hernias as well as a 4 mm left lower lung nodule. He states that he will have some occasional discomfort in his right groin. He also will have some occasional right groin discomfort that radiates down his anterior thigh as well as along his right hip and buttock. He continues to smoke up to 2 packs per day. He does take an oral anticoagulant because of his coronary artery disease. He has had a prior vasectomy Past Medical History  Diagnosis Date  . CAD (coronary artery disease)     a. INF STEMI 07/04/10:  tx with thrombectomy + Vision BMS to [facility];  cath 07/04/10: dLM 10-20%, pLAD 40-50%, mLAD 20-30%, pRCA 30%, mRCA occluded and tx with PCI, EF 50% with inf HK. A Multilink  . HLD (hyperlipidemia)   . COPD (chronic obstructive pulmonary disease)   . Tobacco abuse   . Bipolar disorder   . History of DVT (deep vein thrombosis)     traumatic, s/p coumadin tx.  . Glucose intolerance (impaired glucose tolerance)     A1c 6.2 06/2010  . Depression   . Myocardial infarction     Past Surgical History  Procedure Laterality Date  . Electrocardiogram      Showed inferolateral ST-elevation and a code STEMI was activated. In the ER, he was treated with morphine, herarin, and 600 mg of Plavix. He was transferred emergently to [facility].   . Stent placement    . Vasectomy      Family History  Problem Relation Age of Onset  . Coronary artery disease Father     Social History History  Substance Use Topics  .  Smoking status: Current Every Day Smoker -- 2.00 packs/day    Types: Cigarettes  . Smokeless tobacco: Current User  . Alcohol Use: No     Comment: occasionally    Allergies  Allergen Reactions  . Penicillins Other (See Comments)    unknown reaction    Current Outpatient Prescriptions  Medication Sig Dispense Refill  . aspirin EC 81 MG tablet Take 1 tablet (81 mg total) by mouth every morning. For platelet aggregation.      . hydrOXYzine (ATARAX/VISTARIL) 25 MG tablet Take 1 tablet (25 mg total) by mouth 3 (three) times daily. For anxiety.  90 tablet  0  . lovastatin (MEVACOR) 40 MG tablet Take 1 tablet (40 mg total) by mouth at bedtime. Cholesterol.      . metoprolol tartrate (LOPRESSOR) 25 MG tablet Take 0.5 tablets (12.5 mg total) by mouth 2 (two) times daily. For anxiety.  60 tablet  0  . naproxen sodium (ANAPROX) 220 MG tablet Take 1 tablet (220 mg total) by mouth 2 (two) times daily as needed (pain).      . Rivaroxaban (XARELTO) 20 MG TABS tablet Take 1 tablet (20 mg total) by mouth daily with supper.  30 tablet    . TRAZODONE HCL PO Take 1 tablet by mouth at bedtime as  needed (sleep).      Marland Kitchen HYDROcodone-acetaminophen (NORCO/VICODIN) 5-325 MG per tablet Take 1-2 tablets every 6 hours as needed for severe pain  10 tablet  0   No current facility-administered medications for this visit.    Review of Systems Review of Systems  Constitutional: Negative for fever, chills, appetite change and unexpected weight change.  HENT: Negative for congestion and trouble swallowing.   Eyes: Negative for visual disturbance.  Respiratory: Negative for chest tightness and shortness of breath.   Cardiovascular: Negative for chest pain and leg swelling.       No PND, no orthopnea, no DOE  Gastrointestinal: Positive for constipation.       See HPI  Genitourinary: Negative for dysuria and hematuria.  Musculoskeletal: Negative.   Skin: Negative for rash.  Neurological: Negative for seizures and  speech difficulty.  Hematological: Does not bruise/bleed easily.  Psychiatric/Behavioral: Negative for behavioral problems and confusion.    Blood pressure 116/72, pulse 80, temperature 98.7 F (37.1 C), resp. rate 16, height 5\' 10"  (1.778 m), weight 204 lb 9.6 oz (92.806 kg).  Physical Exam Physical Exam  Constitutional: He is oriented to person, place, and time. He appears well-developed and well-nourished. No distress.  Slightly disheveled appearance  HENT:  Head: Normocephalic and atraumatic.  Right Ear: External ear normal.  Left Ear: External ear normal.  Eyes: Conjunctivae are normal. No scleral icterus.  Neck: Normal range of motion. Neck supple. No tracheal deviation present. No thyromegaly present.  Cardiovascular: Normal rate, normal heart sounds and intact distal pulses.   Pulmonary/Chest: Effort normal and breath sounds normal. No respiratory distress. He has no wheezes.  Abdominal: Soft. He exhibits no distension. There is no tenderness. There is no rebound. A hernia is present. Hernia confirmed positive in the right inguinal area and confirmed positive in the left inguinal area.  About a 1.5cm fascial defect at umbilicus. reducible  Genitourinary: Testes normal and penis normal.     Large left inguinal hernia extends into scrotum. Reducible. Small RIH.   Musculoskeletal: Normal range of motion. He exhibits no edema and no tenderness.  Lymphadenopathy:    He has no cervical adenopathy.  Neurological: He is alert and oriented to person, place, and time. He exhibits normal muscle tone.  Skin: Skin is warm and dry. No rash noted. He is not diaphoretic. No erythema. No pallor.  Psychiatric: He has a normal mood and affect. His behavior is normal. Judgment and thought content normal.    [facility] psych records Dec 2014 ED notes 04/2013 along with CT  Assessment    Bilateral inguinal hernias Umbilical hernia Tobacco use disorder Chronic  anticoagulation Left lower lung nodule      Plan    We discussed the etiology of inguinal and umbilical hernias. We discussed the signs & symptoms of incarceration & strangulation.  We discussed non-operative and operative management.  Because he has bilateral inguinal hernias as well as a small umbilical hernia I recommended a laparoscopic repair of bilateral inguinal hernias with mesh and open repair of umbilical hernia with possible mesh.   I described the procedure in detail.  The patient was given educational material. We discussed the risks and benefits including but not limited to bleeding, infection, chronic inguinal pain, nerve entrapment, hernia recurrence, mesh complications, hematoma formation, urinary retention, injury to the testicles, numbness in the groin, blood clots, injury to the surrounding structures, and anesthesia risk. We also discussed the typical post operative recovery course, including no heavy  lifting for 6 weeks. I explained that the likelihood of improvement of their symptoms is good.  I discussed that he was at higher risk for infection as well as recurrence given his ongoing tobacco use. We discussed the importance of him trying to cut back and stop smoking given his history of heart disease and moreover because of the small lung nodule.  With respect to his lung nodule I stressed to him the importance of discussing this with his primary care physician. We discussed the fact that he would need a followup CT scan of his lungs within one year to monitor this nodule.  I explained that we would also need cardiology clearance in order to determine whether or not he is safe for surgery and whether or not we can stop his oral anticoagulant perioperatively.  At the end of the appointment he also states that currently he has no funding for surgery and  he would be probably eligible for  Medicare in the fall when he turns 69. We arranged for him to meet with our surgery  scheduler's to talk about potential financial options  Leighton Ruff. Redmond Pulling, MD, FACS General, Bariatric, & Minimally Invasive Surgery [facility]          [place] Curry 05/29/2013, 10:27 AM

## 2013-06-09 ENCOUNTER — Encounter: Payer: Self-pay | Admitting: Cardiovascular Disease

## 2013-06-09 ENCOUNTER — Ambulatory Visit (INDEPENDENT_AMBULATORY_CARE_PROVIDER_SITE_OTHER): Payer: Self-pay | Admitting: Cardiovascular Disease

## 2013-06-09 ENCOUNTER — Other Ambulatory Visit: Payer: Self-pay | Admitting: Cardiovascular Disease

## 2013-06-09 VITALS — BP 120/76 | HR 67 | Ht 69.0 in | Wt 209.0 lb

## 2013-06-09 DIAGNOSIS — M79609 Pain in unspecified limb: Secondary | ICD-10-CM

## 2013-06-09 DIAGNOSIS — I2510 Atherosclerotic heart disease of native coronary artery without angina pectoris: Secondary | ICD-10-CM

## 2013-06-09 DIAGNOSIS — E785 Hyperlipidemia, unspecified: Secondary | ICD-10-CM

## 2013-06-09 DIAGNOSIS — M79604 Pain in right leg: Secondary | ICD-10-CM

## 2013-06-09 DIAGNOSIS — Z720 Tobacco use: Secondary | ICD-10-CM

## 2013-06-09 DIAGNOSIS — F17200 Nicotine dependence, unspecified, uncomplicated: Secondary | ICD-10-CM

## 2013-06-09 NOTE — Patient Instructions (Signed)
Your physician wants you to follow-up in:  5-6 months.  You will receive a reminder letter in the mail two months in advance. If you don't receive a letter, please call our office to schedule the follow-up appointment.

## 2013-06-09 NOTE — Progress Notes (Signed)
History of Present Illness: 63 yo male with history of CAD, HLD, tobacco abuse, bipolar disorder, COPD, DVT 2008 here today for cardiac follow up. He has been followed in the past by Dr. Haroldine Laws. He presented to [facility] on 07/04/10 in transfer from [facility] with an inferior ST elevation myocardial infarction. He was taken to the cardiac catheterization lab by Nona Dell. Cardiac catheterization demonstrated a totally occluded mid RCA. This was treated with a Vision bare metal stent. Residual disease included 10-20% distal left main stenosis, 40-50% proximal LAD and 20-30% mid LAD, 30% proximal RCA. Left ventriculogram demonstrated inferior hypokinesis with an EF of 50%. Recurrence of DVT March 2014 and has been on Xarelto.   He is here today for follow up. No chest pain or SOB. He continues to have some pain in his right leg, anterior aspect at rest. He continues to smoke 2 ppd.   Primary Care Physician: [facility])  Last Lipid Profile: He thinks this is followed in the Health Dept but he has no records. Does not wish to have it checked here since he has no coverage.   Past Medical History  Diagnosis Date  . CAD (coronary artery disease)     a. INF STEMI 07/04/10:  tx with thrombectomy + Vision BMS to [facility];  cath 07/04/10: dLM 10-20%, pLAD 40-50%, mLAD 20-30%, pRCA 30%, mRCA occluded and tx with PCI, EF 50% with inf HK. A Multilink  . HLD (hyperlipidemia)   . COPD (chronic obstructive pulmonary disease)   . Tobacco abuse   . Bipolar disorder   . History of DVT (deep vein thrombosis)     traumatic, s/p coumadin tx.  . Glucose intolerance (impaired glucose tolerance)     A1c 6.2 06/2010  . Depression   . Myocardial infarction     Past Surgical History  Procedure Laterality Date  . Electrocardiogram      Showed inferolateral ST-elevation and a code STEMI was activated. In the ER, he was treated with morphine, herarin, and 600 mg of  Plavix. He was transferred emergently to [facility].   . Stent placement    . Vasectomy      Current Outpatient Prescriptions  Medication Sig Dispense Refill  . aspirin EC 81 MG tablet Take 1 tablet (81 mg total) by mouth every morning. For platelet aggregation.      Marland Kitchen HYDROcodone-acetaminophen (NORCO/VICODIN) 5-325 MG per tablet Take 1-2 tablets every 6 hours as needed for severe pain  10 tablet  0  . hydrOXYzine (ATARAX/VISTARIL) 25 MG tablet Take 1 tablet (25 mg total) by mouth 3 (three) times daily. For anxiety.  90 tablet  0  . ibuprofen (ADVIL,MOTRIN) 200 MG tablet Take 200 mg by mouth every 6 (six) hours as needed.      . lovastatin (MEVACOR) 40 MG tablet Take 1 tablet (40 mg total) by mouth at bedtime. Cholesterol.      . metoprolol tartrate (LOPRESSOR) 25 MG tablet Take 0.5 tablets (12.5 mg total) by mouth 2 (two) times daily. For anxiety.  60 tablet  0  . naproxen sodium (ANAPROX) 220 MG tablet Take 1 tablet (220 mg total) by mouth 2 (two) times daily as needed (pain).      . Rivaroxaban (XARELTO) 20 MG TABS tablet Take 1 tablet (20 mg total) by mouth daily with supper.  30 tablet    . TRAZODONE HCL PO Take 1 tablet by mouth at bedtime as needed (sleep).  No current facility-administered medications for this visit.    Allergies  Allergen Reactions  . Penicillins Other (See Comments)    unknown reaction    History   Social History  . Marital Status: Divorced    Spouse Name: N/A    Number of Children: N/A  . Years of Education: N/A   Occupational History  . Not on file.   Social History Main Topics  . Smoking status: Current Every Day Smoker -- 2.00 packs/day    Types: Cigarettes  . Smokeless tobacco: Current User  . Alcohol Use: No     Comment: occasionally  . Drug Use: Not on file  . Sexual Activity: Not on file   Other Topics Concern  . Not on file   Social History Narrative   Lives in [place]. He lives alone. He has kids but is not married.      Family History  Problem Relation Age of Onset  . Coronary artery disease Father     Review of Systems:  As stated in the HPI and otherwise negative.   BP 120/76  Pulse 67  Ht 5\' 9"  (1.753 m)  Wt 209 lb (94.802 kg)  BMI 30.85 kg/m2  Physical Examination: General: Well developed, well nourished, NAD HEENT: OP clear, mucus membranes moist SKIN: warm, dry. No rashes. Neuro: No focal deficits Musculoskeletal: Muscle strength 5/5 all ext Psychiatric: Mood and affect normal Neck: No JVD, no carotid bruits, no thyromegaly, no lymphadenopathy. Lungs:Clear bilaterally, no wheezes, rhonci, crackles Cardiovascular: Regular rate and rhythm. No murmurs, gallops or rubs. Abdomen:Soft. Bowel sounds present. Non-tender.  Extremities: No lower extremity edema. Pulses are 2 + in the bilateral DP/PT. No groin hematoma or bruit.   EKG: NSR, rate 67 bpm. Inferior infarct.   Assessment and Plan:   1. CAD: Stable. No chest pain or SOB. Continue ASA, statin, beta blocker. No cardiac contra-indication to hernia surgery.   2. DVT: On Xarelto. Followed in Health Dept. I do not follow his DVT. If he needs hernia surgery, will have to have Xarelto issue addressed in primary care.   3. Right leg pain: Sporadic pains in right anterior leg since his cath in 2012. He has good pulses distally in the right leg and a good femoral pulse. Unlikely to be arterial related. I do not think a venous or arterial doppler is indicated. Likely neuropathic pain.   4. HLD: on statin. Needs lipids but will check in October at f/u if not done in Health Dept  5. Tobacco abuse: Still smoking 2 packs per day. He is advised to stop smoking.

## 2013-06-17 ENCOUNTER — Encounter (INDEPENDENT_AMBULATORY_CARE_PROVIDER_SITE_OTHER): Payer: Self-pay | Admitting: General Surgery

## 2013-07-07 ENCOUNTER — Encounter: Payer: Self-pay | Admitting: Cardiovascular Disease

## 2013-07-09 ENCOUNTER — Other Ambulatory Visit (INDEPENDENT_AMBULATORY_CARE_PROVIDER_SITE_OTHER): Payer: Self-pay | Admitting: General Surgery

## 2013-07-09 ENCOUNTER — Telehealth (INDEPENDENT_AMBULATORY_CARE_PROVIDER_SITE_OTHER): Payer: Self-pay | Admitting: General Surgery

## 2013-07-09 ENCOUNTER — Encounter (INDEPENDENT_AMBULATORY_CARE_PROVIDER_SITE_OTHER): Payer: Self-pay

## 2013-07-09 NOTE — Telephone Encounter (Signed)
Called Mr. Dewey to let him know that we have cardiac clearance and he will need to stop his xarelto one day before surgery then start back the day after surgery. I told the patient when our scheduler call him and it may be next week when they call him, I told him to ask for Deneise Lever that I can get him in to see Dr. Redmond Pulling before surgery, patient understood. The orders for surgery are up in medical records and I had Randa Lynn to scan his cardiac clearance

## 2013-10-08 ENCOUNTER — Other Ambulatory Visit: Payer: Self-pay | Admitting: *Deleted

## 2013-10-08 ENCOUNTER — Telehealth: Payer: Self-pay | Admitting: *Deleted

## 2013-10-08 MED ORDER — RIVAROXABAN 20 MG PO TABS: 20.0000 mg | ORAL_TABLET | Freq: Every day | ORAL | Status: DC

## 2013-10-08 NOTE — Telephone Encounter (Signed)
OK to refill Xarelto. cdm

## 2013-10-08 NOTE — Telephone Encounter (Signed)
Patient is requesting a refill on xarelto. He stated that previously the health department has been refilling this but they are not responding to the pharmacy request for this nor are they returning the patients phone calls/messages. Per your office note, you do not follow his dvt. Ok to refill? Please advise. Thanks, MI

## 2013-10-08 NOTE — Telephone Encounter (Signed)
Rx sent in. Patient aware and also aware to call and schedule follow up appointment as well.

## 2013-10-19 NOTE — Progress Notes (Signed)
Please put orders in Epic surgery 10-30-13 pre op 10-26-13 Thanks

## 2013-10-20 ENCOUNTER — Encounter (HOSPITAL_COMMUNITY): Payer: Self-pay | Admitting: Pharmacy Technician

## 2013-10-20 ENCOUNTER — Other Ambulatory Visit (INDEPENDENT_AMBULATORY_CARE_PROVIDER_SITE_OTHER): Payer: Self-pay | Admitting: General Surgery

## 2013-10-22 DIAGNOSIS — Z86718 Personal history of other venous thrombosis and embolism: Secondary | ICD-10-CM | POA: Diagnosis not present

## 2013-10-22 DIAGNOSIS — N401 Enlarged prostate with lower urinary tract symptoms: Secondary | ICD-10-CM | POA: Diagnosis not present

## 2013-10-22 DIAGNOSIS — J42 Unspecified chronic bronchitis: Secondary | ICD-10-CM | POA: Diagnosis not present

## 2013-10-22 DIAGNOSIS — K429 Umbilical hernia without obstruction or gangrene: Secondary | ICD-10-CM | POA: Diagnosis not present

## 2013-10-22 DIAGNOSIS — F17200 Nicotine dependence, unspecified, uncomplicated: Secondary | ICD-10-CM | POA: Diagnosis not present

## 2013-10-22 DIAGNOSIS — I2510 Atherosclerotic heart disease of native coronary artery without angina pectoris: Secondary | ICD-10-CM | POA: Diagnosis not present

## 2013-10-22 DIAGNOSIS — K4001 Bilateral inguinal hernia, with obstruction, without gangrene, recurrent: Secondary | ICD-10-CM | POA: Diagnosis not present

## 2013-10-22 DIAGNOSIS — R351 Nocturia: Secondary | ICD-10-CM | POA: Diagnosis not present

## 2013-10-26 ENCOUNTER — Encounter (INDEPENDENT_AMBULATORY_CARE_PROVIDER_SITE_OTHER): Payer: Self-pay

## 2013-10-26 ENCOUNTER — Encounter (HOSPITAL_COMMUNITY): Payer: Self-pay

## 2013-10-26 ENCOUNTER — Encounter (HOSPITAL_COMMUNITY)
Admission: RE | Admit: 2013-10-26 | Discharge: 2013-10-26 | Disposition: A | Discharge status: Home / Self Care | Payer: Medicare Other | Source: Ambulatory Visit | Attending: General Surgery | Admitting: General Surgery

## 2013-10-26 DIAGNOSIS — Z01818 Encounter for other preprocedural examination: Secondary | ICD-10-CM | POA: Diagnosis not present

## 2013-10-26 DIAGNOSIS — K4020 Bilateral inguinal hernia, without obstruction or gangrene, not specified as recurrent: Secondary | ICD-10-CM | POA: Insufficient documentation

## 2013-10-26 DIAGNOSIS — K429 Umbilical hernia without obstruction or gangrene: Secondary | ICD-10-CM | POA: Insufficient documentation

## 2013-10-26 DIAGNOSIS — I2510 Atherosclerotic heart disease of native coronary artery without angina pectoris: Secondary | ICD-10-CM | POA: Insufficient documentation

## 2013-10-26 HISTORY — DX: Anxiety disorder, unspecified: F41.9

## 2013-10-26 HISTORY — DX: Shortness of breath: R06.02

## 2013-10-26 LAB — BASIC METABOLIC PANEL
ANION GAP: 11 (ref 5–15)
BUN: 14 mg/dL (ref 6–23)
CO2: 25 mEq/L (ref 19–32)
Calcium: 9.2 mg/dL (ref 8.4–10.5)
Chloride: 98 mEq/L (ref 96–112)
Creatinine, Ser: 0.89 mg/dL (ref 0.50–1.35)
GFR calc non Af Amer: 88 mL/min — ABNORMAL LOW (ref >90)
Glucose, Bld: 172 mg/dL — ABNORMAL HIGH (ref 70–99)
Potassium: 4.2 mEq/L (ref 3.7–5.3)
Sodium: 134 mEq/L — ABNORMAL LOW (ref 137–147)

## 2013-10-26 LAB — PROTIME-INR
INR: 1.03 (ref 0.00–1.49)
Prothrombin Time: 13.7 seconds (ref 11.6–15.2)

## 2013-10-26 LAB — CBC WITH DIFFERENTIAL/PLATELET
BASOS ABS: 0.1 10*3/uL (ref 0.0–0.1)
BASOS PCT: 1 % (ref 0–1)
EOS ABS: 0.1 10*3/uL (ref 0.0–0.7)
Eosinophils Relative: 1 % (ref 0–5)
HCT: 46.1 % (ref 39.0–52.0)
Hemoglobin: 16.3 g/dL (ref 13.0–17.0)
Lymphocytes Relative: 25 % (ref 12–46)
Lymphs Abs: 1.8 10*3/uL (ref 0.7–4.0)
MCH: 31.2 pg (ref 26.0–34.0)
MCHC: 35.4 g/dL (ref 30.0–36.0)
MCV: 88.3 fL (ref 78.0–100.0)
MONO ABS: 0.8 10*3/uL (ref 0.1–1.0)
Monocytes Relative: 11 % (ref 3–12)
NEUTROS ABS: 4.3 10*3/uL (ref 1.7–7.7)
Neutrophils Relative %: 62 % (ref 43–77)
Platelets: 202 10*3/uL (ref 150–400)
RBC: 5.22 MIL/uL (ref 4.22–5.81)
RDW: 12.2 % (ref 11.5–15.5)
WBC: 7 10*3/uL (ref 4.0–10.5)

## 2013-10-26 LAB — APTT: aPTT: 32 seconds (ref 24–37)

## 2013-10-26 NOTE — Patient Instructions (Signed)
[place]  10/26/2013   Your procedure is scheduled on:    10/30/2013  Report to [facility] Main Entrance and follow signs to  [place] arrive at [place] AM.   Call this number if you have problems the morning of surgery [phone] or Presurgical Testing [phone].   Remember:  Do not eat food or drink liquids :After Midnight.  For Living Will and/or Health Care Power Attorney Forms: please provide copy for your medical record, may bring AM of surgery (forms should be already notarized-we do not provide this service).     Take these medicines the morning of surgery with A SIP OF WATER: Metoprolol;Eye Drops (bring with you day of surgery)                               You may not have any metal on your body including hair pins and piercings  Do not wear jewelry, lotions, powders, or deodorant.  Men may shave face and neck.               Do not bring valuables to the hospital. [place].  Contacts, dentures or bridgework may not be worn into surgery.  Leave suitcase in the car. After surgery it may be brought to your room.  For patients admitted to the hospital, checkout time is 11:00 AM the day of discharge. ________________________________________________________________________  [facility] - Preparing for Surgery Before surgery, you can play an important role.  Because skin is not sterile, your skin needs to be as free of germs as possible.  You can reduce the number of germs on your skin by washing with CHG (chlorahexidine gluconate) soap before surgery.  CHG is an antiseptic cleaner which kills germs and bonds with the skin to continue killing germs even after washing. Please DO NOT use if you have an allergy to CHG or antibacterial soaps.  If your skin becomes reddened/irritated stop using the CHG and inform your nurse when you arrive at Short Stay. Do not shave (including legs and underarms) for at least 48 hours prior  to the first CHG shower.  You may shave your face/neck. Please follow these instructions carefully:  1.  Shower with CHG Soap the night before surgery and the  morning of Surgery.  2.  If you choose to wash your hair, wash your hair first as usual with your  normal  shampoo.  3.  After you shampoo, rinse your hair and body thoroughly to remove the  shampoo.                           4.  Use CHG as you would any other liquid soap.  You can apply chg directly  to the skin and wash                       Gently with a scrungie or clean washcloth.  5.  Apply the CHG Soap to your body ONLY FROM THE NECK DOWN.   Do not use on face/ open                           Wound or open sores. Avoid contact with eyes, ears mouth and genitals (private parts).  Wash face,  Genitals (private parts) with your normal soap.             6.  Wash thoroughly, paying special attention to the area where your surgery  will be performed.  7.  Thoroughly rinse your body with warm water from the neck down.  8.  DO NOT shower/wash with your normal soap after using and rinsing off  the CHG Soap.                9.  Pat yourself dry with a clean towel.            10.  Wear clean pajamas.            11.  Place clean sheets on your bed the night of your first shower and do not  sleep with pets. Day of Surgery : Do not apply any lotions/deodorants the morning of surgery.  Please wear clean clothes to the hospital/surgery center.  FAILURE TO FOLLOW THESE INSTRUCTIONS MAY RESULT IN THE CANCELLATION OF YOUR SURGERY PATIENT SIGNATURE_________________________________  NURSE SIGNATURE__________________________________  ________________________________________________________________________

## 2013-10-26 NOTE — Progress Notes (Signed)
CXR per epic 12/15/2012 EKG per epic 06/09/2013

## 2013-10-30 ENCOUNTER — Observation Stay (HOSPITAL_COMMUNITY)
Admission: RE | Admit: 2013-10-30 | Discharge: 2013-10-31 | Disposition: A | Discharge status: Home / Self Care | Payer: Medicare Other | Source: Ambulatory Visit | Attending: General Surgery | Admitting: General Surgery

## 2013-10-30 ENCOUNTER — Ambulatory Visit (HOSPITAL_COMMUNITY): Payer: Medicare Other | Admitting: Certified Registered Nurse Anesthetist

## 2013-10-30 ENCOUNTER — Encounter (HOSPITAL_COMMUNITY): Payer: Self-pay

## 2013-10-30 ENCOUNTER — Encounter (HOSPITAL_COMMUNITY)
Admission: RE | Disposition: A | Discharge status: Home / Self Care | Payer: Self-pay | Source: Ambulatory Visit | Attending: General Surgery

## 2013-10-30 ENCOUNTER — Encounter (HOSPITAL_COMMUNITY): Payer: Medicare Other | Admitting: Certified Registered Nurse Anesthetist

## 2013-10-30 DIAGNOSIS — R351 Nocturia: Secondary | ICD-10-CM | POA: Diagnosis not present

## 2013-10-30 DIAGNOSIS — I509 Heart failure, unspecified: Secondary | ICD-10-CM | POA: Insufficient documentation

## 2013-10-30 DIAGNOSIS — Z86718 Personal history of other venous thrombosis and embolism: Secondary | ICD-10-CM | POA: Diagnosis not present

## 2013-10-30 DIAGNOSIS — F17210 Nicotine dependence, cigarettes, uncomplicated: Secondary | ICD-10-CM | POA: Diagnosis not present

## 2013-10-30 DIAGNOSIS — K4020 Bilateral inguinal hernia, without obstruction or gangrene, not specified as recurrent: Principal | ICD-10-CM | POA: Insufficient documentation

## 2013-10-30 DIAGNOSIS — J449 Chronic obstructive pulmonary disease, unspecified: Secondary | ICD-10-CM | POA: Diagnosis not present

## 2013-10-30 DIAGNOSIS — F1010 Alcohol abuse, uncomplicated: Secondary | ICD-10-CM | POA: Diagnosis not present

## 2013-10-30 DIAGNOSIS — F419 Anxiety disorder, unspecified: Secondary | ICD-10-CM | POA: Diagnosis not present

## 2013-10-30 DIAGNOSIS — E780 Pure hypercholesterolemia: Secondary | ICD-10-CM | POA: Diagnosis not present

## 2013-10-30 DIAGNOSIS — R7302 Impaired glucose tolerance (oral): Secondary | ICD-10-CM | POA: Diagnosis not present

## 2013-10-30 DIAGNOSIS — K4090 Unilateral inguinal hernia, without obstruction or gangrene, not specified as recurrent: Secondary | ICD-10-CM | POA: Diagnosis not present

## 2013-10-30 DIAGNOSIS — N401 Enlarged prostate with lower urinary tract symptoms: Secondary | ICD-10-CM | POA: Diagnosis not present

## 2013-10-30 DIAGNOSIS — J42 Unspecified chronic bronchitis: Secondary | ICD-10-CM | POA: Diagnosis not present

## 2013-10-30 DIAGNOSIS — Z8719 Personal history of other diseases of the digestive system: Secondary | ICD-10-CM

## 2013-10-30 DIAGNOSIS — K429 Umbilical hernia without obstruction or gangrene: Secondary | ICD-10-CM | POA: Diagnosis not present

## 2013-10-30 DIAGNOSIS — I2510 Atherosclerotic heart disease of native coronary artery without angina pectoris: Secondary | ICD-10-CM | POA: Insufficient documentation

## 2013-10-30 DIAGNOSIS — F3130 Bipolar disorder, current episode depressed, mild or moderate severity, unspecified: Secondary | ICD-10-CM | POA: Diagnosis not present

## 2013-10-30 DIAGNOSIS — K4000 Bilateral inguinal hernia, with obstruction, without gangrene, not specified as recurrent: Secondary | ICD-10-CM | POA: Diagnosis not present

## 2013-10-30 DIAGNOSIS — Z9889 Other specified postprocedural states: Secondary | ICD-10-CM

## 2013-10-30 HISTORY — PX: INSERTION OF MESH: SHX5868

## 2013-10-30 HISTORY — PX: LAPAROSCOPIC INGUINAL HERNIA WITH UMBILICAL HERNIA: SHX5658

## 2013-10-30 SURGERY — LAPAROSCOPIC INGUINAL HERNIA WITH UMBILICAL HERNIA
Anesthesia: General | Laterality: Bilateral

## 2013-10-30 MED ORDER — LACTATED RINGERS IR SOLN
Status: DC | PRN
  Administered 2013-10-30: 1

## 2013-10-30 MED ORDER — PROPOFOL 10 MG/ML IV BOLUS
INTRAVENOUS | Status: DC | PRN
  Administered 2013-10-30: 150 mg via INTRAVENOUS

## 2013-10-30 MED ORDER — CIPROFLOXACIN IN D5W 400 MG/200ML IV SOLN
400.0000 mg | INTRAVENOUS | Status: AC
  Administered 2013-10-30: 400 mg via INTRAVENOUS

## 2013-10-30 MED ORDER — LACTATED RINGERS IV SOLN: INTRAVENOUS | Status: DC

## 2013-10-30 MED ORDER — HYDROMORPHONE HCL 1 MG/ML IJ SOLN
INTRAMUSCULAR | Status: AC
  Filled 2013-10-30: qty 1

## 2013-10-30 MED ORDER — MIDAZOLAM HCL 5 MG/5ML IJ SOLN
INTRAMUSCULAR | Status: DC | PRN
  Administered 2013-10-30: .5 mg via INTRAVENOUS

## 2013-10-30 MED ORDER — SODIUM CHLORIDE 0.9 % IJ SOLN
INTRAMUSCULAR | Status: AC
  Filled 2013-10-30: qty 10

## 2013-10-30 MED ORDER — OXYCODONE-ACETAMINOPHEN 5-325 MG PO TABS
1.0000 | ORAL_TABLET | ORAL | Status: DC | PRN
  Administered 2013-10-30 – 2013-10-31 (×6): 2 via ORAL
  Filled 2013-10-30 (×6): qty 2

## 2013-10-30 MED ORDER — HYDROXYZINE HCL 25 MG PO TABS
25.0000 mg | ORAL_TABLET | Freq: Two times a day (BID) | ORAL | Status: DC
  Administered 2013-10-30 – 2013-10-31 (×3): 25 mg via ORAL
  Filled 2013-10-30 (×4): qty 1

## 2013-10-30 MED ORDER — MIDAZOLAM HCL 2 MG/2ML IJ SOLN
INTRAMUSCULAR | Status: AC
  Filled 2013-10-30: qty 2

## 2013-10-30 MED ORDER — ONDANSETRON HCL 4 MG/2ML IJ SOLN
INTRAMUSCULAR | Status: DC | PRN
  Administered 2013-10-30 (×2): 2 mg via INTRAVENOUS

## 2013-10-30 MED ORDER — NEOSTIGMINE METHYLSULFATE 10 MG/10ML IV SOLN
INTRAVENOUS | Status: DC | PRN
  Administered 2013-10-30: 3 mg via INTRAVENOUS

## 2013-10-30 MED ORDER — SUCCINYLCHOLINE CHLORIDE 20 MG/ML IJ SOLN
INTRAMUSCULAR | Status: DC | PRN
  Administered 2013-10-30: 120 mg via INTRAVENOUS

## 2013-10-30 MED ORDER — MORPHINE SULFATE 2 MG/ML IJ SOLN: 1.0000 mg | INTRAMUSCULAR | Status: DC | PRN

## 2013-10-30 MED ORDER — PHENOL 1.4 % MT LIQD
1.0000 | OROMUCOSAL | Status: DC | PRN
  Administered 2013-10-30: 1 via OROMUCOSAL
  Filled 2013-10-30: qty 177

## 2013-10-30 MED ORDER — ALBUTEROL SULFATE HFA 108 (90 BASE) MCG/ACT IN AERS
INHALATION_SPRAY | RESPIRATORY_TRACT | Status: DC | PRN
  Administered 2013-10-30: 5 via RESPIRATORY_TRACT

## 2013-10-30 MED ORDER — DEXAMETHASONE SODIUM PHOSPHATE 10 MG/ML IJ SOLN
INTRAMUSCULAR | Status: DC | PRN
  Administered 2013-10-30: 10 mg via INTRAVENOUS

## 2013-10-30 MED ORDER — EPHEDRINE SULFATE 50 MG/ML IJ SOLN
INTRAMUSCULAR | Status: DC | PRN
  Administered 2013-10-30: 10 mg via INTRAVENOUS
  Administered 2013-10-30: 5 mg via INTRAVENOUS

## 2013-10-30 MED ORDER — GUAIFENESIN 100 MG/5ML PO SOLN
5.0000 mL | ORAL | Status: DC | PRN
  Administered 2013-10-30 – 2013-10-31 (×2): 100 mg via ORAL
  Filled 2013-10-30: qty 100
  Filled 2013-10-30: qty 10

## 2013-10-30 MED ORDER — LIDOCAINE HCL (CARDIAC) 20 MG/ML IV SOLN
INTRAVENOUS | Status: DC | PRN
  Administered 2013-10-30: 75 mg via INTRAVENOUS

## 2013-10-30 MED ORDER — CHLORHEXIDINE GLUCONATE 4 % EX LIQD: 1.0000 "application " | Freq: Once | CUTANEOUS | Status: DC

## 2013-10-30 MED ORDER — ONDANSETRON HCL 4 MG/2ML IJ SOLN
INTRAMUSCULAR | Status: AC
Start: 2013-10-30 — End: 2013-10-30
  Filled 2013-10-30: qty 2

## 2013-10-30 MED ORDER — EPHEDRINE SULFATE 50 MG/ML IJ SOLN
INTRAMUSCULAR | Status: AC
  Filled 2013-10-30: qty 1

## 2013-10-30 MED ORDER — BUPIVACAINE-EPINEPHRINE (PF) 0.25% -1:200000 IJ SOLN
INTRAMUSCULAR | Status: AC
  Filled 2013-10-30: qty 30

## 2013-10-30 MED ORDER — ALBUTEROL SULFATE HFA 108 (90 BASE) MCG/ACT IN AERS
INHALATION_SPRAY | RESPIRATORY_TRACT | Status: AC
  Filled 2013-10-30: qty 6.7

## 2013-10-30 MED ORDER — CISATRACURIUM BESYLATE (PF) 10 MG/5ML IV SOLN
INTRAVENOUS | Status: DC | PRN
  Administered 2013-10-30: 6 mg via INTRAVENOUS
  Administered 2013-10-30 (×2): 2 mg via INTRAVENOUS

## 2013-10-30 MED ORDER — TRAZODONE HCL 50 MG PO TABS
50.0000 mg | ORAL_TABLET | Freq: Every evening | ORAL | Status: DC | PRN
  Administered 2013-10-31: 50 mg via ORAL
  Filled 2013-10-30: qty 1

## 2013-10-30 MED ORDER — FENTANYL CITRATE 0.05 MG/ML IJ SOLN
INTRAMUSCULAR | Status: AC
  Filled 2013-10-30: qty 5

## 2013-10-30 MED ORDER — SODIUM CHLORIDE 0.9 % IV SOLN
10.0000 mg | INTRAVENOUS | Status: DC | PRN
  Administered 2013-10-30: 40 ug/min via INTRAVENOUS

## 2013-10-30 MED ORDER — CISATRACURIUM BESYLATE 20 MG/10ML IV SOLN
INTRAVENOUS | Status: AC
  Filled 2013-10-30: qty 10

## 2013-10-30 MED ORDER — ENOXAPARIN SODIUM 40 MG/0.4ML ~~LOC~~ SOLN
40.0000 mg | SUBCUTANEOUS | Status: DC
  Filled 2013-10-30 (×2): qty 0.4

## 2013-10-30 MED ORDER — GLYCOPYRROLATE 0.2 MG/ML IJ SOLN
INTRAMUSCULAR | Status: DC | PRN
  Administered 2013-10-30: .1 mg via INTRAVENOUS
  Administered 2013-10-30: .6 mg via INTRAVENOUS
  Administered 2013-10-30: 0.1 mg via INTRAVENOUS

## 2013-10-30 MED ORDER — METOPROLOL TARTRATE 12.5 MG HALF TABLET
12.5000 mg | ORAL_TABLET | Freq: Two times a day (BID) | ORAL | Status: DC
  Administered 2013-10-30 – 2013-10-31 (×2): 12.5 mg via ORAL
  Filled 2013-10-30 (×4): qty 1

## 2013-10-30 MED ORDER — 0.9 % SODIUM CHLORIDE (POUR BTL) OPTIME
TOPICAL | Status: DC | PRN
  Administered 2013-10-30: 1000 mL

## 2013-10-30 MED ORDER — ONDANSETRON HCL 4 MG PO TABS: 4.0000 mg | ORAL_TABLET | Freq: Four times a day (QID) | ORAL | Status: DC | PRN

## 2013-10-30 MED ORDER — HYDROMORPHONE HCL 1 MG/ML IJ SOLN
0.2500 mg | INTRAMUSCULAR | Status: DC | PRN
  Administered 2013-10-30: 0.5 mg via INTRAVENOUS

## 2013-10-30 MED ORDER — POTASSIUM CHLORIDE IN NACL 20-0.9 MEQ/L-% IV SOLN
INTRAVENOUS | Status: DC
  Administered 2013-10-30: 13:00:00 via INTRAVENOUS
  Filled 2013-10-30 (×2): qty 1000

## 2013-10-30 MED ORDER — BUPIVACAINE-EPINEPHRINE 0.25% -1:200000 IJ SOLN
INTRAMUSCULAR | Status: DC | PRN
  Administered 2013-10-30: 60 mL

## 2013-10-30 MED ORDER — METOPROLOL TARTRATE 12.5 MG HALF TABLET
12.5000 mg | ORAL_TABLET | Freq: Once | ORAL | Status: AC
  Administered 2013-10-30: 12.5 mg via ORAL
  Filled 2013-10-30: qty 1

## 2013-10-30 MED ORDER — LACTATED RINGERS IV SOLN
INTRAVENOUS | Status: DC | PRN
  Administered 2013-10-30 (×2): via INTRAVENOUS

## 2013-10-30 MED ORDER — DEXAMETHASONE SODIUM PHOSPHATE 10 MG/ML IJ SOLN
INTRAMUSCULAR | Status: AC
  Filled 2013-10-30: qty 1

## 2013-10-30 MED ORDER — ONDANSETRON HCL 4 MG/2ML IJ SOLN: 4.0000 mg | Freq: Four times a day (QID) | INTRAMUSCULAR | Status: DC | PRN

## 2013-10-30 MED ORDER — PROPOFOL 10 MG/ML IV BOLUS
INTRAVENOUS | Status: AC
  Filled 2013-10-30: qty 20

## 2013-10-30 MED ORDER — POLYVINYL ALCOHOL 1.4 % OP SOLN: 1.0000 [drp] | OPHTHALMIC | Status: DC | PRN

## 2013-10-30 MED ORDER — ATROPINE SULFATE 0.4 MG/ML IJ SOLN
INTRAMUSCULAR | Status: AC
  Filled 2013-10-30: qty 1

## 2013-10-30 MED ORDER — CIPROFLOXACIN IN D5W 400 MG/200ML IV SOLN
INTRAVENOUS | Status: AC
  Filled 2013-10-30: qty 200

## 2013-10-30 MED ORDER — GUAIFENESIN ER 600 MG PO TB12
600.0000 mg | ORAL_TABLET | Freq: Two times a day (BID) | ORAL | Status: DC | PRN
  Administered 2013-10-30: 600 mg via ORAL
  Filled 2013-10-30 (×2): qty 1

## 2013-10-30 SURGICAL SUPPLY — 41 items
ADH SKN CLS APL DERMABOND .7 (GAUZE/BANDAGES/DRESSINGS) ×1
APL SKNCLS STERI-STRIP NONHPOA (GAUZE/BANDAGES/DRESSINGS)
BANDAGE ADH SHEER 1  50/CT (GAUZE/BANDAGES/DRESSINGS) IMPLANT
BENZOIN TINCTURE PRP APPL 2/3 (GAUZE/BANDAGES/DRESSINGS) IMPLANT
CANISTER SUCTION 2500CC (MISCELLANEOUS) ×3 IMPLANT
CLOSURE WOUND 1/2 X4 (GAUZE/BANDAGES/DRESSINGS)
DECANTER SPIKE VIAL GLASS SM (MISCELLANEOUS) IMPLANT
DERMABOND ADVANCED (GAUZE/BANDAGES/DRESSINGS) ×2
DERMABOND ADVANCED .7 DNX12 (GAUZE/BANDAGES/DRESSINGS) ×1 IMPLANT
DEVICE SECURE STRAP 25 ABSORB (INSTRUMENTS) ×2 IMPLANT
DRAPE LAPAROSCOPIC ABDOMINAL (DRAPES) ×3 IMPLANT
DRSG TEGADERM 2-3/8X2-3/4 SM (GAUZE/BANDAGES/DRESSINGS) IMPLANT
DRSG TEGADERM 4X4.75 (GAUZE/BANDAGES/DRESSINGS) IMPLANT
ELECT PENCIL ROCKER SW 15FT (MISCELLANEOUS) ×3 IMPLANT
ELECT REM PT RETURN 9FT ADLT (ELECTROSURGICAL) ×3
ELECTRODE REM PT RTRN 9FT ADLT (ELECTROSURGICAL) ×1 IMPLANT
GLOVE BIOGEL M STRL SZ7.5 (GLOVE) IMPLANT
GOWN STRL REUS W/TWL XL LVL3 (GOWN DISPOSABLE) ×9 IMPLANT
KIT BASIN OR (CUSTOM PROCEDURE TRAY) ×3 IMPLANT
MESH ULTRAPRO 6X6 15CM15CM (Mesh General) ×6 IMPLANT
NS IRRIG 1000ML POUR BTL (IV SOLUTION) ×3 IMPLANT
RELOAD STAPLE 4.0 BLU F/HERNIA (INSTRUMENTS) IMPLANT
RELOAD STAPLE HERNIA 4.0 BLUE (INSTRUMENTS) IMPLANT
RELOAD STAPLE HERNIA 4.8 BLK (STAPLE) IMPLANT
SCALPEL HARMONIC ACE (MISCELLANEOUS) IMPLANT
SCISSORS LAP 5X35 DISP (ENDOMECHANICALS) ×3 IMPLANT
SET IRRIG TUBING LAPAROSCOPIC (IRRIGATION / IRRIGATOR) ×2 IMPLANT
SOLUTION ANTI FOG 6CC (MISCELLANEOUS) ×3 IMPLANT
STAPLER HERNIA 12 8.5 360D (INSTRUMENTS) ×1 IMPLANT
STRIP CLOSURE SKIN 1/2X4 (GAUZE/BANDAGES/DRESSINGS) IMPLANT
SUT MNCRL AB 4-0 PS2 18 (SUTURE) ×3 IMPLANT
SUT NOVA NAB DX-16 0-1 5-0 T12 (SUTURE) ×2 IMPLANT
SUT VIC AB 3-0 SH 18 (SUTURE) ×2 IMPLANT
TOWEL OR 17X26 10 PK STRL BLUE (TOWEL DISPOSABLE) ×3 IMPLANT
TOWEL OR NON WOVEN STRL DISP B (DISPOSABLE) ×3 IMPLANT
TRAY FOLEY CATH 14FRSI W/METER (CATHETERS) IMPLANT
TRAY LAPAROSCOPIC (CUSTOM PROCEDURE TRAY) ×3 IMPLANT
TROCAR BLADELESS OPT 5 75 (ENDOMECHANICALS) ×3 IMPLANT
TROCAR SLEEVE XCEL 5X75 (ENDOMECHANICALS) ×3 IMPLANT
TROCAR XCEL BLUNT TIP 100MML (ENDOMECHANICALS) ×3 IMPLANT
TUBING INSUFFLATION 10FT LAP (TUBING) ×3 IMPLANT

## 2013-10-30 NOTE — Discharge Instructions (Signed)
[facility]  UMBILICAL OR INGUINAL HERNIA REPAIR: POST OP INSTRUCTIONS  Always review your discharge instruction sheet given to you by the facility where your surgery was performed. IF YOU HAVE DISABILITY OR FAMILY LEAVE FORMS, YOU MUST BRING THEM TO THE OFFICE FOR PROCESSING.   DO NOT GIVE THEM TO YOUR DOCTOR.  1. A  prescription for pain medication may be given to you upon discharge.  Take your pain medication as prescribed, if needed.  If narcotic pain medicine is not needed, then you may take acetaminophen (Tylenol) or ibuprofen (Advil) as needed. 2. Take your usually prescribed medications unless otherwise directed. 3. If you need a refill on your pain medication, please contact your pharmacy.  They will contact our office to request authorization. Prescriptions will not be filled after 5 pm or on week-ends. 4. You should follow a light diet the first 24 hours after arrival home, such as soup and crackers, etc.  Be sure to include lots of fluids daily.  Resume your normal diet the day after surgery. 5. Most patients will experience some swelling and bruising around the umbilicus or in the groin and scrotum.  Ice packs and reclining will help.  Swelling and bruising can take several days to resolve.  6. It is common to experience some constipation if taking pain medication after surgery.  Increasing fluid intake and taking a stool softener (such as Colace) will usually help or prevent this problem from occurring.  A mild laxative (Milk of Magnesia or Miralax) should be taken according to package directions if there are no bowel movements after 48 hours. 7.  If your surgeon used skin glue on the incision, you may shower in 24 hours.  The glue will flake off over the next 2-3 weeks.  Any sutures or staples will be removed at the office during your follow-up visit. 8. ACTIVITIES:  You may resume regular (light) daily activities beginning the next day--such as daily self-care,  walking, climbing stairs--gradually increasing activities as tolerated.  You may have sexual intercourse when it is comfortable.  Refrain from any heavy lifting or straining until approved by your doctor. a. You may drive when you are no longer taking prescription pain medication, you can comfortably wear a seatbelt, and you can safely maneuver your car and apply brakes. b. RETURN TO WORK:  9. You should see your doctor in the office for a follow-up appointment approximately 2-3 weeks after your surgery.  Make sure that you call for this appointment within a day or two after you arrive home to insure a convenient appointment time. 10. OTHER INSTRUCTIONS: RESUME XARELTO ON Saturday 10/30/13 11. DO NOT LIFT, PUSH OR PULL ANYTHING GREATER THAN 10 POUNDS FOR 6 WEEKS 12. GET UP AND WALK AT LEAST ONCE  PER HOUR WHILE AWAKE   WHEN TO CALL YOUR DOCTOR: 1. Fever over 101.0 2. Inability to urinate 3. Nausea and/or vomiting 4. Extreme swelling or bruising 5. Continued bleeding from incision. 6. Increased pain, redness, or drainage from the incision  The clinic staff is available to answer your questions during regular business hours.  Please dont hesitate to call and ask to speak to one of the nurses for clinical concerns.  If you have a medical emergency, go to the nearest emergency room or call 911.  A surgeon from [facility] is always on call at the hospital   [address], [place], [place], [place]  [address] ?  [id number], [place],  [place]   [address] [phone] ? [phone] ? FAX [phone] Web site: [email or web address]

## 2013-10-30 NOTE — Anesthesia Procedure Notes (Signed)
Procedure Name: Intubation Date/Time: 10/30/2013 7:45 AM Performed by: Ofilia Neas Pre-anesthesia Checklist: Patient identified, Emergency Drugs available, Suction available, Patient being monitored and Timeout performed Patient Re-evaluated:Patient Re-evaluated prior to inductionOxygen Delivery Method: Circle system utilized Preoxygenation: Pre-oxygenation with 100% oxygen Intubation Type: IV induction Ventilation: Mask ventilation without difficulty Laryngoscope Size: Mac and 4 Grade View: Grade I Tube type: Oral Tube size: 7.5 mm Number of attempts: 1 Airway Equipment and Method: Stylet Placement Confirmation: ETT inserted through vocal cords under direct vision,  positive ETCO2 and breath sounds checked- equal and bilateral Secured at: 21 cm Tube secured with: Tape Dental Injury: Teeth and Oropharynx as per pre-operative assessment

## 2013-10-30 NOTE — Transfer of Care (Signed)
Immediate Anesthesia Transfer of Care Note  Patient: Anthony [name]  Procedure(s) Performed: Procedure(s): laparoscopic repair left pantaloom hernia with mesh, laparoscopic right inguinal hernia with mesh, OPEN REPAIR OF UMBILICAL HERNIA  (Bilateral) INSERTION OF MESH (Bilateral)  Patient Location: PACU  Anesthesia Type:General  Level of Consciousness: awake, alert , oriented, patient cooperative, lethargic and responds to stimulation  Airway & Oxygen Therapy: Patient Spontanous Breathing and Patient connected to face mask oxygen  Post-op Assessment: Report given to PACU RN, Post -op Vital signs reviewed and stable and Patient moving all extremities  Post vital signs: Reviewed and stable  Complications: No apparent anesthesia complications

## 2013-10-30 NOTE — Interval H&P Note (Signed)
History and Physical Interval Note:  10/30/2013 7:27 AM  Anthony [name]  has presented today for surgery, with the diagnosis of Bilateral Inguinal Hernia, umbilical Hernia  The various methods of treatment have been discussed with the patient and family. After consideration of risks, benefits and other options for treatment, the patient has consented to  Procedure(s): [place], OPEN REPAIR OF UMBILICAL HERNIA WITH POSSIBLE MESH (Bilateral) INSERTION OF MESH (Bilateral) as a surgical intervention .  The patient's history has been reviewed, patient examined, no change in status, stable for surgery.  I have reviewed the patient's chart and labs.  Questions were answered to the patient's satisfaction.    Rt calf soft, nontender Stopped xarelto Did not pick up flomax and take It  Leighton Ruff. Redmond Pulling, MD, [place], Bariatric, & Minimally Invasive Surgery [facility] Surgery, [place]  [facility]

## 2013-10-30 NOTE — Anesthesia Preprocedure Evaluation (Addendum)
Anesthesia Evaluation  Patient identified by MRN, date of birth, ID band Patient awake    Reviewed: Allergy & Precautions, H&P , NPO status , Patient's Chart, lab work & pertinent test results, reviewed documented beta blocker date and time   Airway Mallampati: II TM Distance: >3 FB Neck ROM: full    Dental  (+) Edentulous Upper, Edentulous Lower, Dental Advisory Given   Pulmonary shortness of breath and with exertion, COPDCurrent Smoker,  breath sounds clear to auscultation  Pulmonary exam normal       Cardiovascular Exercise Tolerance: Good + CAD, + Past MI and + Cardiac Stents Rhythm:regular Rate:Normal  INF STEMI 6/12   Neuro/Psych Anxiety Depression Bipolar Disorder negative neurological ROS  negative psych ROS   GI/Hepatic negative GI ROS, Neg liver ROS,   Endo/Other  negative endocrine ROSImpaired glucose tolerance  Renal/GU negative Renal ROS  negative genitourinary   Musculoskeletal   Abdominal   Peds  Hematology negative hematology ROS (+)   Anesthesia Other Findings   Reproductive/Obstetrics negative OB ROS                          Anesthesia Physical Anesthesia Plan  ASA: III  Anesthesia Plan: General   Post-op Pain Management:    Induction: Intravenous  Airway Management Planned: Oral ETT  Additional Equipment:   Intra-op Plan:   Post-operative Plan: Extubation in OR  Informed Consent: I have reviewed the patients History and Physical, chart, labs and discussed the procedure including the risks, benefits and alternatives for the proposed anesthesia with the patient or authorized representative who has indicated his/her understanding and acceptance.   Dental Advisory Given  Plan Discussed with: CRNA and Surgeon  Anesthesia Plan Comments:         Anesthesia Quick Evaluation

## 2013-10-30 NOTE — Anesthesia Postprocedure Evaluation (Signed)
  Anesthesia Post-op Note  Patient: Anthony [name]  Procedure(s) Performed: Procedure(s) (LRB): laparoscopic repair left pantaloom hernia with mesh, laparoscopic right inguinal hernia with mesh, OPEN REPAIR OF UMBILICAL HERNIA  (Bilateral) INSERTION OF MESH (Bilateral)  Patient Location: PACU  Anesthesia Type: General  Level of Consciousness: awake and alert   Airway and Oxygen Therapy: Patient Spontanous Breathing  Post-op Pain: mild  Post-op Assessment: Post-op Vital signs reviewed, Patient's Cardiovascular Status Stable, Respiratory Function Stable, Patent Airway and No signs of Nausea or vomiting  Last Vitals:  Filed Vitals:   10/30/13 0947  BP: 121/73  Pulse: 69  Temp: 36.7 C  Resp: 15    Post-op Vital Signs: stable   Complications: No apparent anesthesia complications

## 2013-10-30 NOTE — Op Note (Signed)
10/30/2013  Anthony [name] July 01, 1950   PREOPERATIVE DIAGNOSIS: bilateral inguinal hernias, umbilical hernia  POSTOPERATIVE DIAGNOSIS: Left pantaloon (direct & indirect) inguinal hernia; right direct inguinal hernia; umbilical hernia  PROCEDURE: Laparoscopic repair of Left pantaloon inguinal hernia with  mesh and right direct inguinal hernia with mesh(TAPP). Open primary repair of umbilical hernia   SURGEON: Leighton Ruff. Redmond Pulling, MD   ASSISTANT SURGEON: None.   ANESTHESIA: General plus local consisting of 0.25% Marcaine with epi.   ESTIMATED BLOOD LOSS: Minimal.   FINDINGS: The patient had a both a left direct and indirect inguinal hernia. He had a right direct inguinal hernia.  It was repaired using a 6 inch x 6  inch piece of Ethicon UltraPro mesh that was cut in half (a 3 in x 6 in piece of mesh in each groin). 5.1OA umbilical fascial defect.   SPECIMEN: none  INDICATIONS FOR PROCEDURE: 63 year old obese Caucasian male with history of bipolar disease and tobacco abuse presented with a large symptomatic left groin hernia was also found to have a right groin hernia as well as an umbilical hernia. He desired surgical repair. The risks and benefits including but not limited to bleeding, infection, chronic inguinal pain, nerve entrapment, hernia recurrence, mesh complications, hematoma formation, urinary retention, injury to the testicles or the ovaries, numbness in the groin, blood clots, injury to the surrounding structures, and anesthesia risk was discussed with the patient.  DESCRIPTION OF PROCEDURE: After obtaining verbal consent the patient was then taken back to the operating room, placed  supine on the operating room table. General endotracheal anesthesia was established. The patient had emptied their bladder prior to going back to  the operating room. Sequential compression devices were placed. The abdomen and groin were prepped and draped in the usual standard surgical  fashion  with ChloraPrep. The patient received  IV antibiotics prior to the incision. A surgical time-out was performed.  Local was infiltrated at the base of the umbilicus.   0.250% Marcaine with epinephrine was used to anesthetize the skin. A curvilinear infraumbilical incision was created. Dissection was carried down to the hernia sac located above the fascia and was mobilized from surrounding structures. Intact fascia was identified circumferentially around the defect. Skin and soft tissue was mobilized from the surface of the fascia in a circumferential manner. A finger sweep was performed underneath the fascia to ensure there were no adhesions to the anterior abdominal wall. Pursestring suture was placed around the fascial edges using a 0 Vicryl. A 12-mm Hasson trocar was placed.  Pneumoperitoneum was smoothly established up to a patient pressure of 15 mmHg. Laparoscope was advanced. There was  evidence of a  contralateral direct hernia on the right. The patient had a defects in the left groin both medial and lateral to the inferior epigastric vessel, consistent with an pantaloon inguinal  hernia. Two 5-mm trocars were placed, one on the right, one on the left in the midclavicular line slightly above the level of the umbilicus all  under direct visualization. After local had been infiltrated, I then made incision along the peritoneum on the left, starting 2 inches above  the anterior superior iliac spine and caring it medial toward the median umbilical ligament in a lazy S configuration using  Endo Shears with electrocautery. The peritoneal flap was then gently dissected downward from the anterior abdominal wall taking care not to  injure the inferior epigastric vessels. The pubic bone was identified. The testicular vessels were identified.  Using  traction and  counter traction with short graspers, I reduced both the indirect and direct hernia sacs in its entirety. The testicular vessels had been identified  and preserved. The vas deferens was identified and preserved, and the hernia sac was stripped from those to surrounding structures. I then went about creating a large pocket by  lifting the peritoneum of the pelvic floor. I took great care not to  injure the iliac vessels.  I then turned my attention to the right groin and in a similar fashion Made an incision along the right peritoneum bringing down flap. The peritoneum was brought down all the weight down to the level of the pubic bone. The inferior epigastric vessels were protected. The patient had a direct hernia on this side. It was not as large as the contralateral groin. It was easily reduced. A large pocket was then made to accommodate the mesh. The vas deferens and testicular structures were identified and preserved. The peritoneum was stripped off the structures.   Local anesthetic was injected 2 finger breadths below and medial to the anterior superior iliac spine as well as along both groins prior to placing the mesh. I then obtained a piece of Ethicon UltraPro mesh 6inch x 6 inch; cut in half resulting in 2 pieces of 3in x 6in mesh, placed one piece  through the Hasson trocar and into the right groin so that half of it covered medial  to the inferior epigastric vessels and half of it lateral to the inferior epigastric vessels. The defect was well covered with the mesh. I then secured the mesh to the abdominal wall using an Ethicon secure strap tack. Tacks were placed through  the Cooper's ligament, one tack on each side of the inferior epigastric vessel and two tacks out laterally. No tacks were placed below the shelving edge of the inguinal ligament.  Pneumoperitoneum was reduced to 8 mmHg. I then brought the peritoneal flap back up to the abdominal wall and tacked it to the abdominal wall using 4 tacks. There was no defect in the peritoneum, and the mesh was well covered. I then repeated the same procedure on the left, Placing a 3" x 6" piece of  ultra Pro mesh into the left groin where half of the mesh was lateral to the inferior epigastric vessels and half was medial to the inferior epigastric vessels. The mesh was secured to the abdominal wall into the groin in a similar fashion. No tacks were placed below the shelving edge of inguinal ligament. I then reduced pneumoperitoneum and closed the flap with 4 tacks. The mesh was well covered. I removed the Hasson trocar and removed the previously placed pursestring suture.   The fascia was then closed primarily with 4 interrupted 0-novafil sutures. I then restored pneumoperitoneum and inspected the umbilical closure through the right-sided abdominal trocar into the closure. There is nothing within the closure. It was airtight. Pneumoperitoneum was released. The umbilical cavity was irrigated and additional local was infiltrated in the subcutaneous tissue and fascia. The umbilical stalk was then tacked back down to the fascia with two 3-0 vicryl sutures. Hemostasis was confirmed. The soft tissue was irrigated and closed in layers with inverted interrupted 3-0 vicryl sutures for the deep dermis. All skin incisions were closed with a 4-0 Monocryl in a subcuticular fashion followed by  application of Dermabond. All needle, instrument, and sponge counts were correct x2. There are no immediate complications. The patient tolerated the procedure well. The patient was extubated and taken to the  recovery room in stable condition.  Leighton Ruff. Redmond Pulling, MD, FACS General, Bariatric, & Minimally Invasive Surgery [facility] Surgery, [place]

## 2013-10-30 NOTE — Progress Notes (Signed)
Patient was  Seen drinking water from water fountain outside of short stay prior to arrival this am. Does have a daughter that can pick him up, but does not have anyone to stay w him for next 24 hours

## 2013-10-30 NOTE — H&P (Signed)
History of Present Illness Randall Hiss M. Wadsworth Skolnick MD; 10/26/2013 3:43 PM) Patient words: prep inguinal hernia (bilateral).  The patient is a 63 year old male who presents for a pre-op visit. 63 year old Caucasian male comes in for his preoperative visit. I initially met him back in May 2015 for bilateral inguinal hernias as well as a small umbilical hernia. He is currently scheduled to undergo surgery on October 23. He denies any changes since his last visit. Unfortunately he is still smoking cigarettes. He has been seen by the cardiologist and cleared for surgery. He reports some issues with constipation. He denies any fever, chills, nausea or vomiting. He still complains of left groin pain. He has had a prior vasectomy. He is still taking an oral anticoagulant for history of a DVT   Other Problems Gayland Curry, MD; 10/22/2013 10:36 AM) Alcohol Abuse Anxiety Disorder Congestive Heart Failure Hypercholesterolemia Inguinal Hernia Other disease, cancer, significant illness Umbilical Hernia Repair  Past Surgical History Gayland Curry, MD; 10/22/2013 10:36 AM) Vasectomy  Diagnostic Studies History Gayland Curry, MD; 10/22/2013 10:36 AM) Colonoscopy never  Allergies Lenoria Farrier; 10/22/2013 10:12 AM) Penicillins  Medication History Apolonio Schneiders [place]; 10/22/2013 10:14 AM) Xarelto (20MG Tablet, Oral daily) Active. Aspirin Childrens (81MG Tablet Chewable, Oral daily) Active. Atarax (25MG Tablet, Oral daily) Active. Mevacor (40MG Tablet, Oral daily) Active. Lopressor (50MG Tablet, .5 Oral daily) Active. TraZODone HCl (50MG Tablet, Oral daily) Active. Valerian Root Plus (Oral daily) Active.  Social History Gayland Curry, MD; 10/22/2013 10:36 AM) Caffeine use Coffee. Tobacco use Current every day smoker. Alcohol use Remotely quit alcohol use. Illicit drug use Remotely quit drug use.  Family History Gayland Curry, MD; 10/22/2013 10:36 AM) Alcohol Abuse Father,  Mother, Sister. Depression Father, Mother.  Review of Systems Randall Hiss M. Keelin Neville MD; 10/22/2013 10:36 AM) General Present- Fatigue and Fever. Not Present- Appetite Loss, Chills, Night Sweats, Weight Gain and Weight Loss. HEENT Present- Ringing in the Ears and Wears glasses/contact lenses. Not Present- Earache, Hearing Loss, Hoarseness, Nose Bleed, Oral Ulcers, Seasonal Allergies, Sinus Pain, Sore Throat, Visual Disturbances and Yellow Eyes. Gastrointestinal Present- Constipation and Rectal Pain. Not Present- Abdominal Pain, Bloating, Bloody Stool, Change in Bowel Habits, Chronic diarrhea, Difficulty Swallowing, Excessive gas, Gets full quickly at meals, Hemorrhoids, Indigestion, Nausea and Vomiting. Male Genitourinary Present- Urgency. Not Present- Blood in Urine, Change in Urinary Stream, Frequency, Impotence, Nocturia, Painful Urination and Urine Leakage.   Vitals Apolonio Schneiders [place]; 10/22/2013 10:15 AM) 10/22/2013 10:14 AM Weight: 213 lb Height: 70in Body Surface Area: 2.18 m Body Mass Index: 30.56 kg/m Temp.: 98.52F(Oral)  Pulse: 74 (Regular)  Resp.: 16 (Unlabored)  BP: 118/68 (Sitting, Left Arm, Standard)    Physical Exam Randall Hiss M. Tina Temme MD; 10/26/2013 3:41 PM) General Mental Status-Alert. General Appearance-Consistent with stated age. Hydration-Well hydrated. Voice-Normal. Note: obese; A LITTLE DISHEVELED   Head and Neck Head-normocephalic, atraumatic with no lesions or palpable masses. Trachea-midline. Thyroid Gland Characteristics - normal size and consistency.  Eye Eyeball - Bilateral-Extraocular movements intact. Sclera/Conjunctiva - Bilateral-No scleral icterus.  Chest and Lung Exam Chest and lung exam reveals -quiet, even and easy respiratory effort with no use of accessory muscles and on auscultation, normal breath sounds, no adventitious sounds and normal vocal resonance. Inspection Chest Wall - Normal. Back - normal.  Breast -  Did not examine.  Cardiovascular Cardiovascular examination reveals -normal heart sounds, regular rate and rhythm with no murmurs and normal pedal pulses bilaterally.  Abdomen Inspection  Inspection of the abdomen reveals: Note: ABOUT  A 1.5CM FASCIAL DEFECT AT THE UMBILICUS - NT, REDUCIBLE. Skin - Scar - no surgical scars. Palpation/Percussion Palpation and Percussion of the abdomen reveal - Soft, Non Tender, No Rebound tenderness, No Rigidity (guarding) and No hepatosplenomegaly. Auscultation Auscultation of the abdomen reveals - Bowel sounds normal.  Male Genitourinary Note: LARGE LEFT INGUINAL HERNIA EXTENDS INTO SCROTUM, SOFT, NT; SMALL REDUCIBLE RIGHT IH. BOTH TESTICLES DOWN   Peripheral Vascular Upper Extremity Palpation - Pulses bilaterally normal.  Neurologic Neurologic evaluation reveals -alert and oriented x 3 with no impairment of recent or remote memory. Mental Status-Normal.  Neuropsychiatric The patient's mood and affect are described as -normal. Judgment and Insight-insight is appropriate concerning matters relevant to self.  Musculoskeletal Normal Exam - Left-Upper Extremity Strength Normal and Lower Extremity Strength Normal. Normal Exam - Right-Upper Extremity Strength Normal and Lower Extremity Strength Normal.  Lymphatic Head & Neck  General Head & Neck Lymphatics: Bilateral - Description - Normal. Axillary - Did not examine. Femoral & Inguinal - Did not examine.    Assessment & Plan Randall Hiss M. Holton Sidman MD; 10/22/2013 10:35 AM) BILATERAL RECURRENT INGUINAL HERNIA WITH OBSTRUCTION AND WITHOUT GANGRENE (161.09  U04.54) UMBILICAL HERNIA WITHOUT OBSTRUCTION AND WITHOUT GANGRENE (553.1  K42.9) NOCTURIA ASSOCIATED WITH BENIGN PROSTATIC HYPERTROPHY (600.01  N40.1) Current Plans  Started Flomax 0.4MG, 1 (one) Capsule daily, #7, 7 days starting 10/22/2013, No Refill. Instructions: Start taking 3 days before surgery including the morning of  surgery HISTORY OF DVT (DEEP VEIN THROMBOSIS) (V12.51  Z86.718) Current Plans Instructions: stop taking Xarelto on Sunday 10/25/13 - Last dose to be taken this Sunday TOBACCO DEPENDENCE (305.1  F17.200) Current Plans Pt Education - Smoking: Ways to Quit: tobacco CHRONIC BRONCHITIS, UNSPECIFIED CHRONIC BRONCHITIS TYPE (491.9  J42) CORONARY ATHEROSCLEROSIS DUE TO CALCIFIED CORONARY LESION (414.00  I25.10) BIPOLAR DEPRESSION (296.50  F31.30) GLUCOSE INTOLERANCE (IMPAIRED GLUCOSE TOLERANCE) (790.22  R73.02)  Leighton Ruff. Redmond Pulling, MD, FACS General, Bariatric, & Minimally Invasive Surgery [facility] Surgery, [place]

## 2013-10-31 DIAGNOSIS — K4020 Bilateral inguinal hernia, without obstruction or gangrene, not specified as recurrent: Secondary | ICD-10-CM | POA: Diagnosis not present

## 2013-10-31 MED ORDER — OXYCODONE-ACETAMINOPHEN 10-325 MG PO TABS: 1.0000 | ORAL_TABLET | ORAL | Status: DC | PRN

## 2013-10-31 NOTE — Discharge Summary (Signed)
Physician Discharge Summary  Patient ID: Anthony [name] MRN: [id number] DOB/AGE: [birth year] 63 y.o.  Admit date: 10/30/2013 Discharge date: 10/31/2013  Admission Diagnoses: s/p bIHR with mesh and UHR   Discharge Diagnoses:  Active Problems:   S/P bilateral inguinal hernia repair   Discharged Condition: good  Hospital Course: PT doing well.  No medical complaints this AM.  Pain well addressed.  Afebrile, ready for home  Consults: None  Significant Diagnostic Studies: none  Treatments: surgery: as above  Discharge Exam: Blood pressure 129/82, pulse 76, temperature 98.3 F (36.8 C), temperature source Oral, resp. rate 18, height 5\' 10"  (1.778 m), weight 211 lb 2 oz (95.766 kg), SpO2 95.00%. General appearance: alert and cooperative GI: soft, non-tender; bowel sounds normal; no masses,  no organomegaly wound c/d/i  Disposition: 01-Home or Self Care  Discharge Instructions   Diet - low sodium heart healthy    Complete by:  As directed      Increase activity slowly    Complete by:  As directed             Medication List         5-HTP PO  Take 1-2 tablets by mouth at bedtime.     aspirin EC 81 MG tablet  Take 81 mg by mouth 2 (two) times daily.     DRAMAMINE 50 MG tablet  Generic drug:  dimenhyDRINATE  Take 50-200 mg by mouth at bedtime.     guaiFENesin 600 MG 12 hr tablet  Commonly known as:  MUCINEX  Take 600 mg by mouth 2 (two) times daily as needed for cough or to loosen phlegm.     hydrOXYzine 25 MG tablet  Commonly known as:  ATARAX/VISTARIL  Take 25 mg by mouth 2 (two) times daily.     ibuprofen 800 MG tablet  Commonly known as:  ADVIL,MOTRIN  Take 800 mg by mouth every 8 (eight) hours as needed.     ibuprofen 200 MG tablet  Commonly known as:  ADVIL,MOTRIN  Take 200 mg by mouth every 6 (six) hours as needed. Pt takes 1-2 every 4 hours     lovastatin 20 MG tablet  Commonly known as:  MEVACOR  Take 20 mg by mouth at bedtime.     LUTEIN PO   Take 1-2 tablets by mouth every morning.     metoprolol tartrate 25 MG tablet  Commonly known as:  LOPRESSOR  Take 0.5 tablets (12.5 mg total) by mouth 2 (two) times daily. For anxiety.     OVER THE COUNTER MEDICATION  1 each 3 (three) times daily. Vicks Inhaler.     polyvinyl alcohol 1.4 % ophthalmic solution  Commonly known as:  LIQUIFILM TEARS  Place 1-2 drops into both eyes as needed for dry eyes.     rivaroxaban 20 MG Tabs tablet  Commonly known as:  XARELTO  Take 1 tablet (20 mg total) by mouth daily with supper.     TRAZODONE HCL PO  Take 1 tablet by mouth at bedtime as needed (sleep).     VALERIAN ROOT PO  Take 1-2 tablets by mouth at bedtime.           Follow-up Information   Follow up with Gayland Curry, MD. Schedule an appointment as soon as possible for a visit in 4 weeks. (For wound re-check)    Specialty:  General Surgery   Contact information:   [address] [place] [address] [phone]  Signed: Rosario Jacks., Saira Kramme 10/31/2013, 9:47 AM

## 2013-10-31 NOTE — Progress Notes (Signed)
UR completed 

## 2013-11-02 ENCOUNTER — Encounter (HOSPITAL_COMMUNITY): Payer: Self-pay | Admitting: General Surgery

## 2013-12-21 DIAGNOSIS — M9904 Segmental and somatic dysfunction of sacral region: Secondary | ICD-10-CM | POA: Diagnosis not present

## 2013-12-21 DIAGNOSIS — M5414 Radiculopathy, thoracic region: Secondary | ICD-10-CM | POA: Diagnosis not present

## 2013-12-21 DIAGNOSIS — M9902 Segmental and somatic dysfunction of thoracic region: Secondary | ICD-10-CM | POA: Diagnosis not present

## 2013-12-21 DIAGNOSIS — M5417 Radiculopathy, lumbosacral region: Secondary | ICD-10-CM | POA: Diagnosis not present

## 2013-12-21 DIAGNOSIS — M9903 Segmental and somatic dysfunction of lumbar region: Secondary | ICD-10-CM | POA: Diagnosis not present

## 2013-12-21 DIAGNOSIS — M5137 Other intervertebral disc degeneration, lumbosacral region: Secondary | ICD-10-CM | POA: Diagnosis not present

## 2013-12-23 DIAGNOSIS — M9903 Segmental and somatic dysfunction of lumbar region: Secondary | ICD-10-CM | POA: Diagnosis not present

## 2013-12-23 DIAGNOSIS — M5414 Radiculopathy, thoracic region: Secondary | ICD-10-CM | POA: Diagnosis not present

## 2013-12-23 DIAGNOSIS — M9904 Segmental and somatic dysfunction of sacral region: Secondary | ICD-10-CM | POA: Diagnosis not present

## 2013-12-23 DIAGNOSIS — M5417 Radiculopathy, lumbosacral region: Secondary | ICD-10-CM | POA: Diagnosis not present

## 2013-12-23 DIAGNOSIS — M5137 Other intervertebral disc degeneration, lumbosacral region: Secondary | ICD-10-CM | POA: Diagnosis not present

## 2013-12-23 DIAGNOSIS — M9902 Segmental and somatic dysfunction of thoracic region: Secondary | ICD-10-CM | POA: Diagnosis not present

## 2013-12-25 ENCOUNTER — Other Ambulatory Visit: Payer: Self-pay | Admitting: Cardiovascular Disease

## 2013-12-25 DIAGNOSIS — M9904 Segmental and somatic dysfunction of sacral region: Secondary | ICD-10-CM | POA: Diagnosis not present

## 2013-12-25 DIAGNOSIS — M9902 Segmental and somatic dysfunction of thoracic region: Secondary | ICD-10-CM | POA: Diagnosis not present

## 2013-12-25 DIAGNOSIS — M5414 Radiculopathy, thoracic region: Secondary | ICD-10-CM | POA: Diagnosis not present

## 2013-12-25 DIAGNOSIS — F332 Major depressive disorder, recurrent severe without psychotic features: Secondary | ICD-10-CM | POA: Diagnosis not present

## 2013-12-25 DIAGNOSIS — M9903 Segmental and somatic dysfunction of lumbar region: Secondary | ICD-10-CM | POA: Diagnosis not present

## 2013-12-25 DIAGNOSIS — M5417 Radiculopathy, lumbosacral region: Secondary | ICD-10-CM | POA: Diagnosis not present

## 2013-12-25 DIAGNOSIS — M5137 Other intervertebral disc degeneration, lumbosacral region: Secondary | ICD-10-CM | POA: Diagnosis not present

## 2013-12-28 DIAGNOSIS — M5137 Other intervertebral disc degeneration, lumbosacral region: Secondary | ICD-10-CM | POA: Diagnosis not present

## 2013-12-28 DIAGNOSIS — M5414 Radiculopathy, thoracic region: Secondary | ICD-10-CM | POA: Diagnosis not present

## 2013-12-28 DIAGNOSIS — M9903 Segmental and somatic dysfunction of lumbar region: Secondary | ICD-10-CM | POA: Diagnosis not present

## 2013-12-28 DIAGNOSIS — M5417 Radiculopathy, lumbosacral region: Secondary | ICD-10-CM | POA: Diagnosis not present

## 2013-12-28 DIAGNOSIS — M9902 Segmental and somatic dysfunction of thoracic region: Secondary | ICD-10-CM | POA: Diagnosis not present

## 2013-12-28 DIAGNOSIS — M9904 Segmental and somatic dysfunction of sacral region: Secondary | ICD-10-CM | POA: Diagnosis not present

## 2013-12-28 NOTE — Telephone Encounter (Signed)
I spoke to patient on phone for final visit for Accelerate Study. Patient is doing well. Patient stated he is almost out of Xarelto for DVT. He wanted me to help him get Xarelto refill. He stated [facility] is not seeing him anymore since he uses Medicare. He wanted Dr. [name] to refill Xarelto for him since he does not have a primary doctor.I called Pat and Dr. Standley Brooking but out of office on vacation. I spoke to Eliot Ford. She stated Dr.MCAlhany not managing DVT and had not orginally prescribed Xarelto. She suggested patient call [facility] back and has for primary doctor referral. I called patient and explained to him.importance of establishing with primary doctor. Patient thanked me for assistance and stated he would call health department for primary doctor referrals.

## 2013-12-28 NOTE — Telephone Encounter (Deleted)
I spoke to patient on phone for final visit for Accelerate Study. Patient is doing well. Patient stated he is almost out of Xarelto for DVT. He wanted me to help him get Xarelto refill. He stated [facility] is not seeing him anymore since he uses Medicare. He wanted Dr. [name] to refill Xarelto for him since he does not have a primary doctor.I called Pat and Dr. Standley Brooking but out of office on vacation. I spoke to Eliot Ford. She stated Dr.MCAlhany not managing DVT and had not orginally prescribed Xarelto. She suggested patient call [facility] back and has for primary doctor referral. I called patient and explained to him.importance of establishing with primary doctor. Patient thanked me for assistance and stated he would call health department for primary doctor referrals.

## 2013-12-29 DIAGNOSIS — M5417 Radiculopathy, lumbosacral region: Secondary | ICD-10-CM | POA: Diagnosis not present

## 2013-12-29 DIAGNOSIS — M9902 Segmental and somatic dysfunction of thoracic region: Secondary | ICD-10-CM | POA: Diagnosis not present

## 2013-12-29 DIAGNOSIS — M9904 Segmental and somatic dysfunction of sacral region: Secondary | ICD-10-CM | POA: Diagnosis not present

## 2013-12-29 DIAGNOSIS — M9903 Segmental and somatic dysfunction of lumbar region: Secondary | ICD-10-CM | POA: Diagnosis not present

## 2013-12-29 DIAGNOSIS — M5137 Other intervertebral disc degeneration, lumbosacral region: Secondary | ICD-10-CM | POA: Diagnosis not present

## 2013-12-29 DIAGNOSIS — M5414 Radiculopathy, thoracic region: Secondary | ICD-10-CM | POA: Diagnosis not present

## 2013-12-30 DIAGNOSIS — M5137 Other intervertebral disc degeneration, lumbosacral region: Secondary | ICD-10-CM | POA: Diagnosis not present

## 2013-12-30 DIAGNOSIS — M9903 Segmental and somatic dysfunction of lumbar region: Secondary | ICD-10-CM | POA: Diagnosis not present

## 2013-12-30 DIAGNOSIS — M5414 Radiculopathy, thoracic region: Secondary | ICD-10-CM | POA: Diagnosis not present

## 2013-12-30 DIAGNOSIS — M9902 Segmental and somatic dysfunction of thoracic region: Secondary | ICD-10-CM | POA: Diagnosis not present

## 2013-12-30 DIAGNOSIS — M9904 Segmental and somatic dysfunction of sacral region: Secondary | ICD-10-CM | POA: Diagnosis not present

## 2013-12-30 DIAGNOSIS — M5417 Radiculopathy, lumbosacral region: Secondary | ICD-10-CM | POA: Diagnosis not present

## 2013-12-31 ENCOUNTER — Other Ambulatory Visit: Payer: Self-pay | Admitting: *Deleted

## 2013-12-31 MED ORDER — RIVAROXABAN 20 MG PO TABS: 20.0000 mg | ORAL_TABLET | Freq: Every day | ORAL | Status: DC

## 2014-01-04 DIAGNOSIS — M9902 Segmental and somatic dysfunction of thoracic region: Secondary | ICD-10-CM | POA: Diagnosis not present

## 2014-01-04 DIAGNOSIS — M5414 Radiculopathy, thoracic region: Secondary | ICD-10-CM | POA: Diagnosis not present

## 2014-01-04 DIAGNOSIS — M9904 Segmental and somatic dysfunction of sacral region: Secondary | ICD-10-CM | POA: Diagnosis not present

## 2014-01-04 DIAGNOSIS — M5137 Other intervertebral disc degeneration, lumbosacral region: Secondary | ICD-10-CM | POA: Diagnosis not present

## 2014-01-04 DIAGNOSIS — M9903 Segmental and somatic dysfunction of lumbar region: Secondary | ICD-10-CM | POA: Diagnosis not present

## 2014-01-04 DIAGNOSIS — M5417 Radiculopathy, lumbosacral region: Secondary | ICD-10-CM | POA: Diagnosis not present

## 2014-01-06 DIAGNOSIS — M9904 Segmental and somatic dysfunction of sacral region: Secondary | ICD-10-CM | POA: Diagnosis not present

## 2014-01-06 DIAGNOSIS — M5414 Radiculopathy, thoracic region: Secondary | ICD-10-CM | POA: Diagnosis not present

## 2014-01-06 DIAGNOSIS — M5417 Radiculopathy, lumbosacral region: Secondary | ICD-10-CM | POA: Diagnosis not present

## 2014-01-06 DIAGNOSIS — M5137 Other intervertebral disc degeneration, lumbosacral region: Secondary | ICD-10-CM | POA: Diagnosis not present

## 2014-01-06 DIAGNOSIS — M9902 Segmental and somatic dysfunction of thoracic region: Secondary | ICD-10-CM | POA: Diagnosis not present

## 2014-01-06 DIAGNOSIS — M9903 Segmental and somatic dysfunction of lumbar region: Secondary | ICD-10-CM | POA: Diagnosis not present

## 2014-01-07 DIAGNOSIS — M5414 Radiculopathy, thoracic region: Secondary | ICD-10-CM | POA: Diagnosis not present

## 2014-01-07 DIAGNOSIS — M5417 Radiculopathy, lumbosacral region: Secondary | ICD-10-CM | POA: Diagnosis not present

## 2014-01-07 DIAGNOSIS — M9902 Segmental and somatic dysfunction of thoracic region: Secondary | ICD-10-CM | POA: Diagnosis not present

## 2014-01-07 DIAGNOSIS — M5137 Other intervertebral disc degeneration, lumbosacral region: Secondary | ICD-10-CM | POA: Diagnosis not present

## 2014-01-07 DIAGNOSIS — M9904 Segmental and somatic dysfunction of sacral region: Secondary | ICD-10-CM | POA: Diagnosis not present

## 2014-01-07 DIAGNOSIS — M9903 Segmental and somatic dysfunction of lumbar region: Secondary | ICD-10-CM | POA: Diagnosis not present

## 2014-01-13 DIAGNOSIS — M9904 Segmental and somatic dysfunction of sacral region: Secondary | ICD-10-CM | POA: Diagnosis not present

## 2014-01-13 DIAGNOSIS — M5137 Other intervertebral disc degeneration, lumbosacral region: Secondary | ICD-10-CM | POA: Diagnosis not present

## 2014-01-13 DIAGNOSIS — M5414 Radiculopathy, thoracic region: Secondary | ICD-10-CM | POA: Diagnosis not present

## 2014-01-13 DIAGNOSIS — M9902 Segmental and somatic dysfunction of thoracic region: Secondary | ICD-10-CM | POA: Diagnosis not present

## 2014-01-13 DIAGNOSIS — M5417 Radiculopathy, lumbosacral region: Secondary | ICD-10-CM | POA: Diagnosis not present

## 2014-01-13 DIAGNOSIS — M9903 Segmental and somatic dysfunction of lumbar region: Secondary | ICD-10-CM | POA: Diagnosis not present

## 2014-01-15 DIAGNOSIS — M9903 Segmental and somatic dysfunction of lumbar region: Secondary | ICD-10-CM | POA: Diagnosis not present

## 2014-01-15 DIAGNOSIS — M9902 Segmental and somatic dysfunction of thoracic region: Secondary | ICD-10-CM | POA: Diagnosis not present

## 2014-01-15 DIAGNOSIS — M5137 Other intervertebral disc degeneration, lumbosacral region: Secondary | ICD-10-CM | POA: Diagnosis not present

## 2014-01-15 DIAGNOSIS — M5417 Radiculopathy, lumbosacral region: Secondary | ICD-10-CM | POA: Diagnosis not present

## 2014-01-15 DIAGNOSIS — M5414 Radiculopathy, thoracic region: Secondary | ICD-10-CM | POA: Diagnosis not present

## 2014-01-15 DIAGNOSIS — M9904 Segmental and somatic dysfunction of sacral region: Secondary | ICD-10-CM | POA: Diagnosis not present

## 2014-01-18 DIAGNOSIS — M9903 Segmental and somatic dysfunction of lumbar region: Secondary | ICD-10-CM | POA: Diagnosis not present

## 2014-01-18 DIAGNOSIS — M9904 Segmental and somatic dysfunction of sacral region: Secondary | ICD-10-CM | POA: Diagnosis not present

## 2014-01-18 DIAGNOSIS — M5137 Other intervertebral disc degeneration, lumbosacral region: Secondary | ICD-10-CM | POA: Diagnosis not present

## 2014-01-18 DIAGNOSIS — M9902 Segmental and somatic dysfunction of thoracic region: Secondary | ICD-10-CM | POA: Diagnosis not present

## 2014-01-18 DIAGNOSIS — M5414 Radiculopathy, thoracic region: Secondary | ICD-10-CM | POA: Diagnosis not present

## 2014-01-18 DIAGNOSIS — M5417 Radiculopathy, lumbosacral region: Secondary | ICD-10-CM | POA: Diagnosis not present

## 2014-01-21 DIAGNOSIS — M9902 Segmental and somatic dysfunction of thoracic region: Secondary | ICD-10-CM | POA: Diagnosis not present

## 2014-01-21 DIAGNOSIS — M5417 Radiculopathy, lumbosacral region: Secondary | ICD-10-CM | POA: Diagnosis not present

## 2014-01-21 DIAGNOSIS — M9903 Segmental and somatic dysfunction of lumbar region: Secondary | ICD-10-CM | POA: Diagnosis not present

## 2014-01-21 DIAGNOSIS — M5414 Radiculopathy, thoracic region: Secondary | ICD-10-CM | POA: Diagnosis not present

## 2014-01-21 DIAGNOSIS — M5137 Other intervertebral disc degeneration, lumbosacral region: Secondary | ICD-10-CM | POA: Diagnosis not present

## 2014-01-21 DIAGNOSIS — M9904 Segmental and somatic dysfunction of sacral region: Secondary | ICD-10-CM | POA: Diagnosis not present

## 2014-01-25 DIAGNOSIS — M9904 Segmental and somatic dysfunction of sacral region: Secondary | ICD-10-CM | POA: Diagnosis not present

## 2014-01-25 DIAGNOSIS — M9902 Segmental and somatic dysfunction of thoracic region: Secondary | ICD-10-CM | POA: Diagnosis not present

## 2014-01-25 DIAGNOSIS — M9903 Segmental and somatic dysfunction of lumbar region: Secondary | ICD-10-CM | POA: Diagnosis not present

## 2014-01-25 DIAGNOSIS — M5414 Radiculopathy, thoracic region: Secondary | ICD-10-CM | POA: Diagnosis not present

## 2014-01-25 DIAGNOSIS — M5417 Radiculopathy, lumbosacral region: Secondary | ICD-10-CM | POA: Diagnosis not present

## 2014-01-25 DIAGNOSIS — M5137 Other intervertebral disc degeneration, lumbosacral region: Secondary | ICD-10-CM | POA: Diagnosis not present

## 2014-02-03 DIAGNOSIS — M5417 Radiculopathy, lumbosacral region: Secondary | ICD-10-CM | POA: Diagnosis not present

## 2014-02-03 DIAGNOSIS — M5414 Radiculopathy, thoracic region: Secondary | ICD-10-CM | POA: Diagnosis not present

## 2014-02-03 DIAGNOSIS — M5137 Other intervertebral disc degeneration, lumbosacral region: Secondary | ICD-10-CM | POA: Diagnosis not present

## 2014-02-03 DIAGNOSIS — M9903 Segmental and somatic dysfunction of lumbar region: Secondary | ICD-10-CM | POA: Diagnosis not present

## 2014-02-03 DIAGNOSIS — M9902 Segmental and somatic dysfunction of thoracic region: Secondary | ICD-10-CM | POA: Diagnosis not present

## 2014-02-03 DIAGNOSIS — M9904 Segmental and somatic dysfunction of sacral region: Secondary | ICD-10-CM | POA: Diagnosis not present

## 2014-02-04 DIAGNOSIS — F332 Major depressive disorder, recurrent severe without psychotic features: Secondary | ICD-10-CM | POA: Diagnosis not present

## 2014-02-15 ENCOUNTER — Telehealth: Payer: Self-pay | Admitting: Family Medicine

## 2014-02-16 DIAGNOSIS — F332 Major depressive disorder, recurrent severe without psychotic features: Secondary | ICD-10-CM | POA: Diagnosis not present

## 2014-02-16 NOTE — Telephone Encounter (Signed)
Pt returned call, given new pt appt with dr Sabra Heck 3/17 at 9:30. Pt aware to arrive 15 minutes prior to appt with a copy of his insurance card and a current list of all medications.

## 2014-02-23 DIAGNOSIS — F332 Major depressive disorder, recurrent severe without psychotic features: Secondary | ICD-10-CM | POA: Diagnosis not present

## 2014-02-24 DIAGNOSIS — M5417 Radiculopathy, lumbosacral region: Secondary | ICD-10-CM | POA: Diagnosis not present

## 2014-02-24 DIAGNOSIS — M5137 Other intervertebral disc degeneration, lumbosacral region: Secondary | ICD-10-CM | POA: Diagnosis not present

## 2014-02-24 DIAGNOSIS — M9902 Segmental and somatic dysfunction of thoracic region: Secondary | ICD-10-CM | POA: Diagnosis not present

## 2014-02-24 DIAGNOSIS — M5414 Radiculopathy, thoracic region: Secondary | ICD-10-CM | POA: Diagnosis not present

## 2014-02-24 DIAGNOSIS — M9903 Segmental and somatic dysfunction of lumbar region: Secondary | ICD-10-CM | POA: Diagnosis not present

## 2014-02-24 DIAGNOSIS — M9904 Segmental and somatic dysfunction of sacral region: Secondary | ICD-10-CM | POA: Diagnosis not present

## 2014-03-10 DIAGNOSIS — M9902 Segmental and somatic dysfunction of thoracic region: Secondary | ICD-10-CM | POA: Diagnosis not present

## 2014-03-10 DIAGNOSIS — M5414 Radiculopathy, thoracic region: Secondary | ICD-10-CM | POA: Diagnosis not present

## 2014-03-10 DIAGNOSIS — M5417 Radiculopathy, lumbosacral region: Secondary | ICD-10-CM | POA: Diagnosis not present

## 2014-03-10 DIAGNOSIS — M5137 Other intervertebral disc degeneration, lumbosacral region: Secondary | ICD-10-CM | POA: Diagnosis not present

## 2014-03-10 DIAGNOSIS — M9904 Segmental and somatic dysfunction of sacral region: Secondary | ICD-10-CM | POA: Diagnosis not present

## 2014-03-10 DIAGNOSIS — M9903 Segmental and somatic dysfunction of lumbar region: Secondary | ICD-10-CM | POA: Diagnosis not present

## 2014-03-15 ENCOUNTER — Ambulatory Visit: Payer: Self-pay | Admitting: Cardiovascular Disease

## 2014-03-18 NOTE — Progress Notes (Signed)
History of Present Illness: 64 yo male with history of CAD, HLD, tobacco abuse, bipolar disorder, COPD, DVT 2008 here today for cardiac follow up. He has been followed in the past by Dr. Haroldine Laws. He presented to [facility] on 07/04/10 in transfer from [facility] with an inferior ST elevation myocardial infarction. He was taken to the cardiac catheterization lab by Nona Dell. Cardiac catheterization demonstrated a totally occluded mid RCA. This was treated with a Vision bare metal stent. Residual disease included 10-20% distal left main stenosis, 40-50% proximal LAD and 20-30% mid LAD, 30% proximal RCA. Left ventriculogram demonstrated inferior hypokinesis with an EF of 50%. Recurrence of DVT March 2014 and has been on Xarelto. Hernia repair October 2015.   He is here today for follow up. No chest pain or SOB. Right leg pain improved. He continues to smoke 2 ppd.   Primary Care Physician: [facility]) Starting at [place] Lipid Profile: He thinks this is followed in the Health Dept but he has no records. Does not wish to have it checked here   Past Medical History  Diagnosis Date  . CAD (coronary artery disease)     a. INF STEMI 07/04/10:  tx with thrombectomy + Vision BMS to [facility];  cath 07/04/10: dLM 10-20%, pLAD 40-50%, mLAD 20-30%, pRCA 30%, mRCA occluded and tx with PCI, EF 50% with inf HK. A Multilink  . HLD (hyperlipidemia)   . COPD (chronic obstructive pulmonary disease)   . Tobacco abuse   . Bipolar disorder   . History of DVT (deep vein thrombosis)     traumatic, s/p coumadin tx.  . Glucose intolerance (impaired glucose tolerance)     A1c 6.2 06/2010  . Depression   . Myocardial infarction   . Shortness of breath     walking distance or climbing stairs  . Anxiety     Past Surgical History  Procedure Laterality Date  . Electrocardiogram      Showed inferolateral ST-elevation and a code STEMI was  activated. In the ER, he was treated with morphine, herarin, and 600 mg of Plavix. He was transferred emergently to [facility].   . Stent placement    . Vasectomy    . Laparoscopic inguinal hernia with umbilical hernia Bilateral 10/30/2013    Procedure: laparoscopic repair left pantaloom hernia with mesh, laparoscopic right inguinal hernia with mesh, OPEN REPAIR OF UMBILICAL HERNIA ;  Surgeon: Gayland Curry, MD;  Location: [facility] ORS;  Service: General;  Laterality: Bilateral;  . Insertion of mesh Bilateral 10/30/2013    Procedure: INSERTION OF MESH;  Surgeon: Gayland Curry, MD;  Location: [facility] ORS;  Service: General;  Laterality: Bilateral;    Current Outpatient Prescriptions  Medication Sig Dispense Refill  . 5-Hydroxytryptophan (5-HTP PO) Take 1-2 tablets by mouth at bedtime.    Marland Kitchen aspirin EC 81 MG tablet Take 81 mg by mouth 2 (two) times daily.     Marland Kitchen dimenhyDRINATE (DRAMAMINE) 50 MG tablet Take 50-200 mg by mouth at bedtime.    Marland Kitchen guaiFENesin (MUCINEX) 600 MG 12 hr tablet Take 600 mg by mouth 2 (two) times daily as needed for cough or to loosen phlegm.    . hydrOXYzine (ATARAX/VISTARIL) 25 MG tablet Take 25 mg by mouth 2 (two) times daily.    Marland Kitchen lovastatin (MEVACOR) 20 MG tablet Take 20 mg by mouth at bedtime.    . LUTEIN PO Take 1-2 tablets by  mouth every morning.    . metoprolol tartrate (LOPRESSOR) 25 MG tablet Take 0.5 tablets (12.5 mg total) by mouth 2 (two) times daily. For anxiety. 60 tablet 0  . naproxen sodium (ANAPROX) 220 MG tablet Take 220 mg by mouth 2 (two) times daily with a meal.    . OVER THE COUNTER MEDICATION 1 each 3 (three) times daily. Vicks Inhaler.    Marland Kitchen oxyCODONE-acetaminophen (PERCOCET) 10-325 MG per tablet Take 1 tablet by mouth every 4 (four) hours as needed for pain. 30 tablet 0  . polyvinyl alcohol (LIQUIFILM TEARS) 1.4 % ophthalmic solution Place 1-2 drops into both eyes as needed for dry eyes.    . rivaroxaban (XARELTO) 20 MG TABS tablet Take 1 tablet (20 mg  total) by mouth daily with supper. 30 tablet 3  . TRAZODONE HCL PO Take 1 tablet by mouth at bedtime as needed (sleep).    Cristino Martes ROOT PO Take 1-2 tablets by mouth at bedtime.     No current facility-administered medications for this visit.    Allergies  Allergen Reactions  . Penicillins Other (See Comments)    unknown reaction    History   Social History  . Marital Status: Divorced    Spouse Name: N/A  . Number of Children: N/A  . Years of Education: N/A   Occupational History  . Not on file.   Social History Main Topics  . Smoking status: Current Every Day Smoker -- 2.00 packs/day for 45 years    Types: Cigarettes  . Smokeless tobacco: Never Used  . Alcohol Use: Yes     Comment: past hx of ETOH use was in inpt facility per court order   . Drug Use: Yes    Special: Marijuana, Heroin, Cocaine     Comment: snorted heroin and cocaine  . Sexual Activity: Not on file   Other Topics Concern  . Not on file   Social History Narrative   Lives in [place]. He lives alone. He has kids but is not married.     Family History  Problem Relation Age of Onset  . Coronary artery disease Father     Review of Systems:  As stated in the HPI and otherwise negative.   BP 102/74 mmHg  Pulse 65  Ht 5\' 10"  (1.778 m)  Wt 210 lb (95.255 kg)  BMI 30.13 kg/m2  Physical Examination: General: Well developed, well nourished, NAD HEENT: OP clear, mucus membranes moist SKIN: warm, dry. No rashes. Neuro: No focal deficits Musculoskeletal: Muscle strength 5/5 all ext Psychiatric: Mood and affect normal Neck: No JVD, no carotid bruits, no thyromegaly, no lymphadenopathy. Lungs:Clear bilaterally, no wheezes, rhonci, crackles Cardiovascular: Regular rate and rhythm. No murmurs, gallops or rubs. Abdomen:Soft. Bowel sounds present. Non-tender.  Extremities: No lower extremity edema. Pulses are 2 + in the bilateral DP/PT. No groin hematoma or bruit.   EKG: NSR, rate 65 bpm. Inferior  infarct.   Assessment and Plan:   1. CAD: Stable. No chest pain or SOB. Continue ASA, statin, beta blocker.   2. DVT: On Xarelto. Followed in Health Dept. I do not follow his DVT. Will need to have this followed in primary care when he establishes there next week.   3. Right leg pain: Sporadic pains in right anterior leg since his cath in 2012. He has good pulses distally in the right leg and a good femoral pulse. Unlikely to be arterial related. I do not think a venous or arterial doppler is indicated. Likely  neuropathic pain. Mostly resolved.   4. HLD: on statin. Needs lipids but he does not wish to let me check it today. Will have checked in primary care  5. Tobacco abuse: Still smoking 2 packs per day. He is advised to stop smoking.

## 2014-03-19 ENCOUNTER — Encounter: Payer: Self-pay | Admitting: Cardiovascular Disease

## 2014-03-19 ENCOUNTER — Ambulatory Visit (INDEPENDENT_AMBULATORY_CARE_PROVIDER_SITE_OTHER): Payer: Medicare Other | Admitting: Cardiovascular Disease

## 2014-03-19 VITALS — BP 102/74 | HR 65 | Ht 70.0 in | Wt 210.0 lb

## 2014-03-19 DIAGNOSIS — M79604 Pain in right leg: Secondary | ICD-10-CM

## 2014-03-19 DIAGNOSIS — I82401 Acute embolism and thrombosis of unspecified deep veins of right lower extremity: Secondary | ICD-10-CM | POA: Diagnosis not present

## 2014-03-19 DIAGNOSIS — E785 Hyperlipidemia, unspecified: Secondary | ICD-10-CM

## 2014-03-19 DIAGNOSIS — Z720 Tobacco use: Secondary | ICD-10-CM

## 2014-03-19 DIAGNOSIS — I2510 Atherosclerotic heart disease of native coronary artery without angina pectoris: Secondary | ICD-10-CM

## 2014-03-19 MED ORDER — RIVAROXABAN 20 MG PO TABS: 20.0000 mg | ORAL_TABLET | Freq: Every day | ORAL | Status: DC

## 2014-03-19 NOTE — Patient Instructions (Signed)
Your physician wants you to follow-up in:  12 months.  You will receive a reminder letter in the mail two months in advance. If you don't receive a letter, please call our office to schedule the follow-up appointment.   

## 2014-03-25 ENCOUNTER — Ambulatory Visit (INDEPENDENT_AMBULATORY_CARE_PROVIDER_SITE_OTHER): Payer: Medicare Other | Admitting: Family Medicine

## 2014-03-25 ENCOUNTER — Telehealth: Payer: Self-pay

## 2014-03-25 ENCOUNTER — Encounter: Payer: Self-pay | Admitting: Family Medicine

## 2014-03-25 ENCOUNTER — Other Ambulatory Visit: Payer: Self-pay | Admitting: Family Medicine

## 2014-03-25 VITALS — BP 124/77 | HR 75 | Temp 96.9°F | Ht 68.25 in | Wt 207.0 lb

## 2014-03-25 DIAGNOSIS — J439 Emphysema, unspecified: Secondary | ICD-10-CM

## 2014-03-25 DIAGNOSIS — R911 Solitary pulmonary nodule: Secondary | ICD-10-CM | POA: Diagnosis not present

## 2014-03-25 DIAGNOSIS — I2510 Atherosclerotic heart disease of native coronary artery without angina pectoris: Secondary | ICD-10-CM

## 2014-03-25 DIAGNOSIS — E785 Hyperlipidemia, unspecified: Secondary | ICD-10-CM | POA: Diagnosis not present

## 2014-03-25 DIAGNOSIS — K4020 Bilateral inguinal hernia, without obstruction or gangrene, not specified as recurrent: Secondary | ICD-10-CM

## 2014-03-25 NOTE — Progress Notes (Signed)
Subjective:    Patient ID: Anthony [name], male    DOB: 03-19-1950, 64 y.o.   MRN: [id number]  HPI 64 year old gentleman, first visit here. Most recently followed at health department and cardiology offices but is now on Medicare and wishes to move his medical care here. There is a history of coronary disease, hernia repairs, bipolar or depression disease, DVT, hyperlipidemia, and COPD. Currently, he has no complaints or symptoms except for possible failure to hernia surgery which she had in October 2015. There is a little confusion about why he is on anticoagulation having had only one DVT by history. He also has a history of lung nodule which she seemed to not know anything about but is supposed to have follow-up CT Re: The nodule by next month.  Patient Active Problem List   Diagnosis Date Noted  . S/P bilateral inguinal hernia repair 10/30/2013  . Nodule of left lung 05/29/2013  . Bilateral inguinal hernia 05/28/2013  . Umbilical hernia 76/28/3151  . Major psychotic depression, recurrent 12/16/2012  . Inadequate anticoagulation 04/29/2012  . Hypocalcemia 07/28/2010  . CAD (coronary artery disease)   . HLD (hyperlipidemia)   . COPD (chronic obstructive pulmonary disease)   . Tobacco abuse   . Depression    Outpatient Encounter Prescriptions as of 03/25/2014  Medication Sig  . aspirin EC 81 MG tablet Take 81 mg by mouth 2 (two) times daily.   . cyanocobalamin 100 MCG tablet Take 100 mcg by mouth daily.  Marland Kitchen lovastatin (MEVACOR) 20 MG tablet Take 20 mg by mouth at bedtime.  . LUTEIN PO Take 1-2 tablets by mouth every morning.  . metoprolol tartrate (LOPRESSOR) 25 MG tablet Take 0.5 tablets (12.5 mg total) by mouth 2 (two) times daily. For anxiety.  . Omega-3 Fatty Acids (FISH OIL PO) Take by mouth.  . rivaroxaban (XARELTO) 20 MG TABS tablet Take 1 tablet (20 mg total) by mouth daily with supper.  . Vitamin D, Cholecalciferol, 1000 UNITS CAPS Take 1 capsule by mouth.  .  [DISCONTINUED] TRAZODONE HCL PO Take 1 tablet by mouth at bedtime as needed (sleep).  . [DISCONTINUED] 5-Hydroxytryptophan (5-HTP PO) Take 1-2 tablets by mouth at bedtime.  . [DISCONTINUED] dimenhyDRINATE (DRAMAMINE) 50 MG tablet Take 50-200 mg by mouth at bedtime.  . [DISCONTINUED] guaiFENesin (MUCINEX) 600 MG 12 hr tablet Take 600 mg by mouth 2 (two) times daily as needed for cough or to loosen phlegm.  . [DISCONTINUED] hydrOXYzine (ATARAX/VISTARIL) 25 MG tablet Take 25 mg by mouth 2 (two) times daily.  . [DISCONTINUED] naproxen sodium (ANAPROX) 220 MG tablet Take 220 mg by mouth 2 (two) times daily with a meal.  . [DISCONTINUED] OVER THE COUNTER MEDICATION 1 each 3 (three) times daily. Vicks Inhaler.  . [DISCONTINUED] oxyCODONE-acetaminophen (PERCOCET) 10-325 MG per tablet Take 1 tablet by mouth every 4 (four) hours as needed for pain.  . [DISCONTINUED] polyvinyl alcohol (LIQUIFILM TEARS) 1.4 % ophthalmic solution Place 1-2 drops into both eyes as needed for dry eyes.  . [DISCONTINUED] VALERIAN ROOT PO Take 1-2 tablets by mouth at bedtime.     Review of Systems  Constitutional: Negative.   HENT: Negative.   Respiratory: Negative.   Cardiovascular: Negative.   Gastrointestinal: Negative.   Neurological: Negative.   Psychiatric/Behavioral: Negative.        Objective:   Physical Exam  Constitutional: He is oriented to person, place, and time. He appears well-nourished.  Neck: Normal range of motion.  Cardiovascular: Normal rate, regular rhythm,  normal heart sounds and intact distal pulses.   Pulmonary/Chest: Effort normal and breath sounds normal.  Abdominal: Soft.  Musculoskeletal: Normal range of motion.  Neurological: He is alert and oriented to person, place, and time.    BP 124/77 mmHg  Pulse 75  Temp(Src) 96.9 F (36.1 C) (Oral)  Ht 5' 8.25" (1.734 m)  Wt 207 lb (93.895 kg)  BMI 31.23 kg/m2       Assessment & Plan:  1. Lung nodule Patient continues to smoke. Will  order follow-up CT to reassess this nodule. Patient has no knowledge of this problem. - CT Chest W Contrast; Future  2. Hyperlipidemia He takes Mevacor but does not recall last time lipids and liver were checked. He does admit to not remembering to take medication daily - CMP14+EGFR - Lipid panel  3. Bilateral inguinal hernia without obstruction or gangrene, recurrence not specified  - Ambulatory referral to General Surgery  4. Pulmonary emphysema, unspecified emphysema type He is on no medication and denies problems with frequent bronchitis or other exacerbation  5. Coronary artery disease involving native coronary artery of native heart without angina pectoris Followed by cardiology. There is no history of chest pain. He did have stent placed back in 2012.

## 2014-03-25 NOTE — Telephone Encounter (Signed)
LMOM to call x-ray

## 2014-03-26 LAB — CMP14+EGFR
A/G RATIO: 1.8 (ref 1.1–2.5)
ALK PHOS: 64 IU/L (ref 39–117)
ALT: 45 IU/L — ABNORMAL HIGH (ref 0–44)
AST: 34 IU/L (ref 0–40)
Albumin: 4.7 g/dL (ref 3.6–4.8)
BUN / CREAT RATIO: 11 (ref 10–22)
BUN: 11 mg/dL (ref 8–27)
Bilirubin Total: 0.5 mg/dL (ref 0.0–1.2)
CALCIUM: 9.4 mg/dL (ref 8.6–10.2)
CO2: 24 mmol/L (ref 18–29)
Chloride: 99 mmol/L (ref 97–108)
Creatinine, Ser: 0.96 mg/dL (ref 0.76–1.27)
GFR calc Af Amer: 95 mL/min/{1.73_m2} (ref >59)
GFR calc non Af Amer: 83 mL/min/{1.73_m2} (ref >59)
GLOBULIN, TOTAL: 2.6 g/dL (ref 1.5–4.5)
Glucose: 143 mg/dL — ABNORMAL HIGH (ref 65–99)
POTASSIUM: 4.4 mmol/L (ref 3.5–5.2)
Sodium: 138 mmol/L (ref 134–144)
Total Protein: 7.3 g/dL (ref 6.0–8.5)

## 2014-03-26 LAB — LIPID PANEL
CHOLESTEROL TOTAL: 160 mg/dL (ref 100–199)
Chol/HDL Ratio: 4.1 ratio units (ref 0.0–5.0)
HDL: 39 mg/dL — ABNORMAL LOW (ref >39)
LDL Calculated: 95 mg/dL (ref 0–99)
TRIGLYCERIDES: 132 mg/dL (ref 0–149)
VLDL CHOLESTEROL CAL: 26 mg/dL (ref 5–40)

## 2014-03-29 DIAGNOSIS — F411 Generalized anxiety disorder: Secondary | ICD-10-CM | POA: Diagnosis not present

## 2014-03-29 DIAGNOSIS — F332 Major depressive disorder, recurrent severe without psychotic features: Secondary | ICD-10-CM | POA: Diagnosis not present

## 2014-03-30 ENCOUNTER — Ambulatory Visit
Admission: RE | Admit: 2014-03-30 | Discharge: 2014-03-30 | Disposition: A | Discharge status: Home / Self Care | Payer: Medicare Other | Source: Ambulatory Visit | Attending: Family Medicine | Admitting: Family Medicine

## 2014-03-30 DIAGNOSIS — J432 Centrilobular emphysema: Secondary | ICD-10-CM | POA: Diagnosis not present

## 2014-03-30 DIAGNOSIS — R918 Other nonspecific abnormal finding of lung field: Secondary | ICD-10-CM | POA: Diagnosis not present

## 2014-03-30 DIAGNOSIS — I2510 Atherosclerotic heart disease of native coronary artery without angina pectoris: Secondary | ICD-10-CM | POA: Diagnosis not present

## 2014-03-30 DIAGNOSIS — Z87891 Personal history of nicotine dependence: Secondary | ICD-10-CM | POA: Diagnosis not present

## 2014-03-30 DIAGNOSIS — R911 Solitary pulmonary nodule: Secondary | ICD-10-CM

## 2014-03-30 MED ORDER — IOPAMIDOL (ISOVUE-300) INJECTION 61%
75.0000 mL | Freq: Once | INTRAVENOUS | Status: AC | PRN
  Administered 2014-03-30: 75 mL via INTRAVENOUS

## 2014-04-03 ENCOUNTER — Telehealth: Payer: Self-pay | Admitting: *Deleted

## 2014-04-03 NOTE — Telephone Encounter (Signed)
-----   Message from Wardell Honour, MD sent at 03/31/2014 10:39 AM EDT ----- Nodules are still present within the long. Plan would be to repeat CT in 6 months

## 2014-04-03 NOTE — Telephone Encounter (Signed)
Lm - need to go over CT and labs

## 2014-04-07 DIAGNOSIS — M9902 Segmental and somatic dysfunction of thoracic region: Secondary | ICD-10-CM | POA: Diagnosis not present

## 2014-04-07 DIAGNOSIS — M9903 Segmental and somatic dysfunction of lumbar region: Secondary | ICD-10-CM | POA: Diagnosis not present

## 2014-04-07 DIAGNOSIS — M5137 Other intervertebral disc degeneration, lumbosacral region: Secondary | ICD-10-CM | POA: Diagnosis not present

## 2014-04-07 DIAGNOSIS — M5414 Radiculopathy, thoracic region: Secondary | ICD-10-CM | POA: Diagnosis not present

## 2014-04-07 DIAGNOSIS — M9904 Segmental and somatic dysfunction of sacral region: Secondary | ICD-10-CM | POA: Diagnosis not present

## 2014-04-07 DIAGNOSIS — M5417 Radiculopathy, lumbosacral region: Secondary | ICD-10-CM | POA: Diagnosis not present

## 2014-04-12 DIAGNOSIS — F332 Major depressive disorder, recurrent severe without psychotic features: Secondary | ICD-10-CM | POA: Diagnosis not present

## 2014-04-12 DIAGNOSIS — F411 Generalized anxiety disorder: Secondary | ICD-10-CM | POA: Diagnosis not present

## 2014-04-14 ENCOUNTER — Other Ambulatory Visit: Payer: Self-pay | Admitting: General Surgery

## 2014-04-14 DIAGNOSIS — K4090 Unilateral inguinal hernia, without obstruction or gangrene, not specified as recurrent: Secondary | ICD-10-CM

## 2014-04-14 DIAGNOSIS — R1909 Other intra-abdominal and pelvic swelling, mass and lump: Secondary | ICD-10-CM | POA: Diagnosis not present

## 2014-04-14 DIAGNOSIS — K429 Umbilical hernia without obstruction or gangrene: Secondary | ICD-10-CM

## 2014-04-14 DIAGNOSIS — K4021 Bilateral inguinal hernia, without obstruction or gangrene, recurrent: Secondary | ICD-10-CM

## 2014-04-16 ENCOUNTER — Ambulatory Visit
Admission: RE | Admit: 2014-04-16 | Discharge: 2014-04-16 | Disposition: A | Discharge status: Home / Self Care | Payer: Medicare Other | Source: Ambulatory Visit | Attending: General Surgery | Admitting: General Surgery

## 2014-04-16 DIAGNOSIS — K760 Fatty (change of) liver, not elsewhere classified: Secondary | ICD-10-CM | POA: Diagnosis not present

## 2014-04-16 DIAGNOSIS — K4020 Bilateral inguinal hernia, without obstruction or gangrene, not specified as recurrent: Secondary | ICD-10-CM | POA: Diagnosis not present

## 2014-04-16 MED ORDER — IOPAMIDOL (ISOVUE-300) INJECTION 61%
125.0000 mL | Freq: Once | INTRAVENOUS | Status: AC | PRN
  Administered 2014-04-16: 125 mL via INTRAVENOUS

## 2014-05-05 DIAGNOSIS — M9902 Segmental and somatic dysfunction of thoracic region: Secondary | ICD-10-CM | POA: Diagnosis not present

## 2014-05-05 DIAGNOSIS — M9904 Segmental and somatic dysfunction of sacral region: Secondary | ICD-10-CM | POA: Diagnosis not present

## 2014-05-05 DIAGNOSIS — M5137 Other intervertebral disc degeneration, lumbosacral region: Secondary | ICD-10-CM | POA: Diagnosis not present

## 2014-05-05 DIAGNOSIS — M5417 Radiculopathy, lumbosacral region: Secondary | ICD-10-CM | POA: Diagnosis not present

## 2014-05-05 DIAGNOSIS — M5414 Radiculopathy, thoracic region: Secondary | ICD-10-CM | POA: Diagnosis not present

## 2014-05-05 DIAGNOSIS — M9903 Segmental and somatic dysfunction of lumbar region: Secondary | ICD-10-CM | POA: Diagnosis not present

## 2014-05-11 ENCOUNTER — Telehealth: Payer: Self-pay | Admitting: Family Medicine

## 2014-05-12 NOTE — Telephone Encounter (Signed)
Reviewing the results of the CT scan there is persistent hernia and fatty liver. It does not appear that there is any need for further surgery per my reading. Okay to call in Wellbutrin (it may not be covered), 100 mg twice a day

## 2014-05-13 DIAGNOSIS — K4091 Unilateral inguinal hernia, without obstruction or gangrene, recurrent: Secondary | ICD-10-CM | POA: Diagnosis not present

## 2014-05-13 DIAGNOSIS — F17200 Nicotine dependence, unspecified, uncomplicated: Secondary | ICD-10-CM | POA: Diagnosis not present

## 2014-05-18 ENCOUNTER — Telehealth: Payer: Self-pay | Admitting: Family Medicine

## 2014-05-18 NOTE — Telephone Encounter (Signed)
Patient is going to fit in touch with Dr. Laqueta Linden office in regards to his hernias.

## 2014-05-19 DIAGNOSIS — F332 Major depressive disorder, recurrent severe without psychotic features: Secondary | ICD-10-CM | POA: Diagnosis not present

## 2014-05-19 DIAGNOSIS — F411 Generalized anxiety disorder: Secondary | ICD-10-CM | POA: Diagnosis not present

## 2014-05-24 ENCOUNTER — Encounter (HOSPITAL_COMMUNITY): Payer: Self-pay | Admitting: Vascular Surgery

## 2014-05-24 ENCOUNTER — Telehealth: Payer: Self-pay | Admitting: Family Medicine

## 2014-05-24 ENCOUNTER — Encounter (HOSPITAL_COMMUNITY): Payer: Self-pay | Admitting: *Deleted

## 2014-05-24 NOTE — Progress Notes (Signed)
Called pt for pre-op call and he states he thinks he has 2 ticks on his scrotum. He states he can't get them off and he wanted to know if that would be a problem for surgery. I instructed him that he needed to have that checked by his PCP or an Urgent Care and if they had any concerns about surgery, that they would call Dr. Dois Davenport office. He states he has felt like he's been warm and more tired recently. Doesn't have a thermometer to check his temp. Denies any chills or bodyaches. Also states he has a rash between his legs (has been there longer than ticks), he states it was there when he saw Dr. Redmond Pulling last week. Pt denies any chest pain or sob. Saw Dr. [name] in April, 2016. Pt did stop his Xarelto as instructed.

## 2014-05-24 NOTE — Progress Notes (Signed)
Called Dr. Dois Davenport office for pre-op orders and to see if they have a cardiac clearance for pt. Spoke with Anderson Malta, she states Dr. Redmond Pulling did not get a cardiac clearance on pt, but did instruct pt to stop Xarelto 5 days prior to surgery.

## 2014-05-24 NOTE — Telephone Encounter (Signed)
Advised that there are no appointments available today. Patient complains of 3 day history of fatigue, weakness, and tactile fever. He removed a tick from his hand a few days ago that was barely attached. He is scheduled for surgery tomorrow. Advised him to let his surgeons office know that he has been sick. Also suggested he seek care at Urgent Care since there are no appointments available today.  Patient stated understanding and agreement to plan.

## 2014-05-24 NOTE — Progress Notes (Signed)
Anesthesia Chart Review:  SAME DAY WORK-UP.  Patient is a 64 year old male posted for open repair of left inguinal hernia with mesh on 05/25/14 by Dr. Redmond Pulling. He is s/p laparoscopic repair of right direct inguinal hernia and left pantaloon hernia with open repair of umbilical hernia on 51/02/58.   Other history includes former smoker, COPD, CAD with inferior STEMI 07/04/10 s/p BMS RCA, glucose intolerance, Bipolar disorder, DVT '08 and 03/2012 (on Xarelto; followed by PCP), anxiety, HTN, exertional dyspnea, HLD. PCP is Dr. Alain Honey. Cardiologist is Dr. [name], last visit 03/19/14 with one year follow-up recommended. No new cardiac testing ordered at that time.   Meds include ASA, lovastatin, Lopressor, fish oil.  He has been on Xarelto for history of DVT (last in 2014). Dr. Redmond Pulling instructed patent to hold Xarelto for five days pre-operatively.  03/19/14 EKG ([facility]): NSR, inferior infarct (age undetermined).   By cardiology notes, "Cardiac catheterization [07/04/10] demonstrated a totally occluded mid RCA. This was treated with a Vision bare metal stent. Residual disease included 10-20% distal left main stenosis, 40-50% proximal LAD and 20-30% mid LAD, 30% proximal RCA. Left ventriculogram demonstrated inferior hypokinesis with an EF of 50%. He did have hypotension and bradycardia post PCI. This required temporary pacemaker implantation and dopamine. He was eventually weaned off of both of these and maintained an adequate blood pressure and heart rate."  03/30/14 Chest CT: IMPRESSION: 1. There are several noncalcified pulmonary nodules, the largest in the right upper lobe anteriorly and inferiorly of 5 mm in diameter. Followup recommendations are given above. 2. Paraseptal and centrilobular emphysema. 3. Fatty infiltration of the liver. 4. Calcification within the left anterior descending coronary artery. Dr. Sabra Heck plans to repeat CT in 6 months.  Of note, during his RN phone  interview, he said he was concerned about possibly having 2 ticks on his scrotum (he wasn't for sure).  He also reported feeling warm but no chills or body aches.  He does have a thermometer. He does have a rash between his legs which he reported was present even when he saw Dr. Redmond Pulling last week.  Our PAT RN instructed him to check with his PCP or Urgent Care about being seen today, so if there was any concern about proceeding with surgery tomorrow then Dr. Redmond Pulling could be notified. Since then there is a nurse's note from Dr. Sabra Heck office instructing patient that Dr. Ammie Ferrier office had no appointments today, and advised him to let Dr. Dois Davenport office know and suggested he seek care at Urgent Care. I also alerted [facility] triage nurse Amy.   If surgery remains as scheduled, he will need labs on arrival.   [facility]/Anesthesiology Phone [phone] 05/24/2014 1:43 PM

## 2014-05-25 ENCOUNTER — Ambulatory Visit (HOSPITAL_COMMUNITY): Admission: RE | Admit: 2014-05-25 | Payer: Medicare Other | Source: Ambulatory Visit | Admitting: General Surgery

## 2014-05-25 HISTORY — DX: Essential (primary) hypertension: I10

## 2014-05-25 SURGERY — REPAIR, HERNIA, INGUINAL, ADULT
Anesthesia: General | Laterality: Left

## 2014-05-26 DIAGNOSIS — F3130 Bipolar disorder, current episode depressed, mild or moderate severity, unspecified: Secondary | ICD-10-CM | POA: Diagnosis not present

## 2014-05-26 DIAGNOSIS — Z86718 Personal history of other venous thrombosis and embolism: Secondary | ICD-10-CM | POA: Diagnosis not present

## 2014-05-26 DIAGNOSIS — F17200 Nicotine dependence, unspecified, uncomplicated: Secondary | ICD-10-CM | POA: Diagnosis not present

## 2014-05-26 DIAGNOSIS — K4091 Unilateral inguinal hernia, without obstruction or gangrene, recurrent: Secondary | ICD-10-CM | POA: Diagnosis not present

## 2014-05-26 DIAGNOSIS — B356 Tinea cruris: Secondary | ICD-10-CM | POA: Diagnosis not present

## 2014-06-01 ENCOUNTER — Encounter: Payer: Self-pay | Admitting: Family Medicine

## 2014-06-01 ENCOUNTER — Ambulatory Visit (INDEPENDENT_AMBULATORY_CARE_PROVIDER_SITE_OTHER): Payer: Medicare Other

## 2014-06-01 ENCOUNTER — Ambulatory Visit (INDEPENDENT_AMBULATORY_CARE_PROVIDER_SITE_OTHER): Payer: Medicare Other | Admitting: Family Medicine

## 2014-06-01 VITALS — BP 120/81 | HR 74 | Temp 97.3°F | Ht 68.25 in | Wt 209.0 lb

## 2014-06-01 DIAGNOSIS — I2510 Atherosclerotic heart disease of native coronary artery without angina pectoris: Secondary | ICD-10-CM

## 2014-06-01 DIAGNOSIS — M25551 Pain in right hip: Secondary | ICD-10-CM

## 2014-06-01 NOTE — Telephone Encounter (Signed)
Pt has been talking to his surgeon regarding the hernia. Will close encounter.

## 2014-06-01 NOTE — Progress Notes (Signed)
Subjective:    Patient ID: Anthony Form., male    DOB: 06-16-1950, 64 y.o.   MRN: [id number]  HPI 64 year old gentleman who presents with right hip pain. The pain has been intermittent. At his last visit here he hardly had any symptoms. More recently pain is worse. He has a long history of bilateral inguinal hernias repairs and re-repairs.  He also has a history of coronary disease having had an MI in the past. He has gone off of most of his medicines arbitrarily. Lovastatin was discontinued 5 or 6 months ago but his most recent LDL cholesterol is less than 100. He also stopped metoprolol. He denies any recent chest pain and blood pressure is normal at 120/81. There is a history of DVT secondary to trauma and injury and he has been on some relative for a number of years. There is no history of atrial fibrillation or pulmonary embolus so I think we could stop the Xarelto  Patient Active Problem List   Diagnosis Date Noted  . S/P bilateral inguinal hernia repair 10/30/2013  . Nodule of left lung 05/29/2013  . Bilateral inguinal hernia 05/28/2013  . Umbilical hernia 50/27/7412  . Major psychotic depression, recurrent 12/16/2012  . Inadequate anticoagulation 04/29/2012  . Hypocalcemia 07/28/2010  . CAD (coronary artery disease)   . HLD (hyperlipidemia)   . COPD (chronic obstructive pulmonary disease)   . Tobacco abuse   . Depression    Outpatient Encounter Prescriptions as of 06/01/2014  Medication Sig  . aspirin EC 81 MG tablet Take 81 mg by mouth 2 (two) times daily.   . cyanocobalamin 100 MCG tablet Take 100 mcg by mouth daily.  Marland Kitchen lovastatin (MEVACOR) 20 MG tablet Take 20 mg by mouth at bedtime.  . LUTEIN PO Take 1-2 tablets by mouth every morning.  . metoprolol tartrate (LOPRESSOR) 25 MG tablet Take 0.5 tablets (12.5 mg total) by mouth 2 (two) times daily. For anxiety. (Patient not taking: Reported on 06/01/2014)  . nicotine (NICODERM CQ - DOSED IN MG/24 HOURS) 14 mg/24hr patch  Place 14 mg onto the skin daily.  . Omega-3 Fatty Acids (FISH OIL PO) Take by mouth.  . rivaroxaban (XARELTO) 20 MG TABS tablet Take 1 tablet (20 mg total) by mouth daily with supper. (Patient not taking: Reported on 05/24/2014)  . Vitamin D, Cholecalciferol, 1000 UNITS CAPS Take 1 capsule by mouth.   No facility-administered encounter medications on file as of 06/01/2014.      Review of Systems  Constitutional: Negative.   Respiratory: Negative.   Cardiovascular: Negative.   Genitourinary: Negative.   Musculoskeletal: Positive for arthralgias.  Psychiatric/Behavioral: Positive for decreased concentration.       Objective:   Physical Exam  Constitutional: He appears well-developed and well-nourished.  Musculoskeletal:  Right hip: Pain is anterior. There is some tenderness around the greater trochanter. Pain increases with extreme flexion the hip as well as internal and external rotation  X-ray shows degenerative changes.    BP 120/81 mmHg  Pulse 74  Temp(Src) 97.3 F (36.3 C) (Oral)  Ht 5' 8.25" (1.734 m)  Wt 209 lb (94.802 kg)  BMI 31.53 kg/m2        Assessment & Plan:  1. Hip pain, right I believe the hip pain is related to degenerative arthritis. I suggested he begin taking glucosamine 2000 mg a day. We talked about anti-inflammatory as versus analgesia 6 and if this is indeed degenerative resident inflammatory analgesia 6 would provide more relief. Also  consider injection.  Wardell Honour MD - DG HIP UNILAT WITH PELVIS 2-3 VIEWS RIGHT; Future

## 2014-06-22 ENCOUNTER — Telehealth: Payer: Self-pay | Admitting: Family Medicine

## 2014-06-23 ENCOUNTER — Ambulatory Visit (INDEPENDENT_AMBULATORY_CARE_PROVIDER_SITE_OTHER): Payer: Medicare Other | Admitting: Family Medicine

## 2014-06-23 ENCOUNTER — Encounter: Payer: Self-pay | Admitting: Family Medicine

## 2014-06-23 VITALS — BP 110/77 | HR 96 | Temp 97.8°F | Ht 68.25 in | Wt 211.0 lb

## 2014-06-23 DIAGNOSIS — G8929 Other chronic pain: Secondary | ICD-10-CM | POA: Diagnosis not present

## 2014-06-23 DIAGNOSIS — M25551 Pain in right hip: Secondary | ICD-10-CM

## 2014-06-23 DIAGNOSIS — I2510 Atherosclerotic heart disease of native coronary artery without angina pectoris: Secondary | ICD-10-CM

## 2014-06-23 NOTE — Telephone Encounter (Signed)
Can try steroid injection if pt would like

## 2014-06-23 NOTE — Progress Notes (Signed)
   Subjective:    Patient ID: Anthony Form., male    DOB: 21-Jun-1950, 64 y.o.   MRN: [id number]  HPI expiratory-year-old gentleman here with right hip pain. We started glucosamine and his last visit and he has been on it now less than 2 weeks but he stopped Tylenol Aleve,ibuprofen and now the pain is worse.   Patient Active Problem List   Diagnosis Date Noted  . S/P bilateral inguinal hernia repair 10/30/2013  . Nodule of left lung 05/29/2013  . Bilateral inguinal hernia 05/28/2013  . Umbilical hernia 71/24/5809  . Major psychotic depression, recurrent 12/16/2012  . Inadequate anticoagulation 04/29/2012  . Hypocalcemia 07/28/2010  . CAD (coronary artery disease)   . HLD (hyperlipidemia)   . COPD (chronic obstructive pulmonary disease)   . Tobacco abuse   . Depression    Outpatient Encounter Prescriptions as of 06/23/2014  Medication Sig  . acetaminophen (TYLENOL) 325 MG tablet Take 650 mg by mouth every 6 (six) hours as needed.  Marland Kitchen aspirin EC 81 MG tablet Take 81 mg by mouth 2 (two) times daily.   Marland Kitchen glucosamine-chondroitin 500-400 MG tablet Take 1 tablet by mouth 3 (three) times daily.  . LUTEIN PO Take 1-2 tablets by mouth every morning.  . Omega-3 Fatty Acids (FISH OIL PO) Take by mouth.  . Vitamin D, Cholecalciferol, 1000 UNITS CAPS Take 1 capsule by mouth.  . [DISCONTINUED] cyanocobalamin 100 MCG tablet Take 100 mcg by mouth daily.  . [DISCONTINUED] lovastatin (MEVACOR) 20 MG tablet Take 20 mg by mouth at bedtime.  . [DISCONTINUED] metoprolol tartrate (LOPRESSOR) 25 MG tablet Take 0.5 tablets (12.5 mg total) by mouth 2 (two) times daily. For anxiety. (Patient not taking: Reported on 06/01/2014)  . [DISCONTINUED] nicotine (NICODERM CQ - DOSED IN MG/24 HOURS) 14 mg/24hr patch Place 14 mg onto the skin daily.  . [DISCONTINUED] rivaroxaban (XARELTO) 20 MG TABS tablet Take 1 tablet (20 mg total) by mouth daily with supper. (Patient not taking: Reported on 05/24/2014)   No  facility-administered encounter medications on file as of 06/23/2014.     Review of Systems  Constitutional: Negative.   HENT: Negative.   Respiratory: Negative.   Gastrointestinal: Positive for blood in stool.  Musculoskeletal: Positive for arthralgias.  Psychiatric/Behavioral: Negative.        Objective:   Physical Exam  Constitutional: He appears well-developed and well-nourished.  Cardiovascular: Normal rate.   Pulmonary/Chest: Effort normal.  Musculoskeletal:  Tender over right greater trochanter    BP 110/77 mmHg  Pulse 96  Temp(Src) 97.8 F (36.6 C) (Oral)  Ht 5' 8.25" (1.734 m)  Wt 211 lb (95.709 kg)  BMI 31.83 kg/m2        Assessment & Plan:  1. Chronic right hip pain Hip pain probably due to trochanteric bursitis. I have suggested that he continues glucosamine and get back on either Tylenol or Aleve with my preference being Tylenol. I would continue glucosamine for at least 2 months before abandoning that as a source of  Anthony Honour MD

## 2014-06-23 NOTE — Telephone Encounter (Signed)
appt made for steroid injection with miller today.

## 2014-07-07 ENCOUNTER — Ambulatory Visit (INDEPENDENT_AMBULATORY_CARE_PROVIDER_SITE_OTHER): Payer: Medicare Other | Admitting: Family

## 2014-07-07 ENCOUNTER — Encounter: Payer: Self-pay | Admitting: Family

## 2014-07-07 VITALS — BP 131/88 | HR 99 | Temp 97.6°F | Ht 68.25 in | Wt 212.0 lb

## 2014-07-07 DIAGNOSIS — M25551 Pain in right hip: Secondary | ICD-10-CM | POA: Diagnosis not present

## 2014-07-07 DIAGNOSIS — I2510 Atherosclerotic heart disease of native coronary artery without angina pectoris: Secondary | ICD-10-CM | POA: Diagnosis not present

## 2014-07-07 MED ORDER — METHYLPREDNISOLONE ACETATE 80 MG/ML IJ SUSP
80.0000 mg | Freq: Once | INTRAMUSCULAR | Status: AC
  Administered 2014-07-07: 80 mg via INTRAMUSCULAR

## 2014-07-07 MED ORDER — TRAMADOL HCL 50 MG PO TABS: 100.0000 mg | ORAL_TABLET | Freq: Two times a day (BID) | ORAL | Status: DC | PRN

## 2014-07-07 MED ORDER — KETOROLAC TROMETHAMINE 30 MG/ML IJ SOLN
30.0000 mg | Freq: Once | INTRAMUSCULAR | Status: AC
  Administered 2014-07-07: 30 mg via INTRAMUSCULAR

## 2014-07-07 NOTE — Progress Notes (Signed)
   Subjective:    Patient ID: Anthony Form., male    DOB: 1950-04-21, 64 y.o.   MRN: [id number]  Hip Pain  The incident occurred more than 1 week ago. There was no injury mechanism. Pain location: Right hip. The pain is at a severity of 9/10. The pain is moderate. The pain has been constant since onset. Associated symptoms include a loss of motion. Pertinent negatives include no numbness or tingling. He reports no foreign bodies present. The symptoms are aggravated by movement and weight bearing. He has tried NSAIDs, acetaminophen and rest for the symptoms. The treatment provided mild relief.      Review of Systems  Constitutional: Negative.   HENT: Negative.   Respiratory: Negative.   Cardiovascular: Negative.   Gastrointestinal: Negative.   Endocrine: Negative.   Genitourinary: Negative.   Musculoskeletal: Negative.   Neurological: Negative.  Negative for tingling and numbness.  Hematological: Negative.   Psychiatric/Behavioral: Negative.   All other systems reviewed and are negative.      Objective:   Physical Exam  Constitutional: He is oriented to person, place, and time. He appears well-developed and well-nourished. No distress.  HENT:  Head: Normocephalic.  Eyes: Pupils are equal, round, and reactive to light. Right eye exhibits no discharge. Left eye exhibits no discharge.  Neck: Normal range of motion. Neck supple. No thyromegaly present.  Cardiovascular: Normal rate, regular rhythm, normal heart sounds and intact distal pulses.   No murmur heard. Pulmonary/Chest: Effort normal and breath sounds normal. No respiratory distress. He has no wheezes.  Abdominal: Soft. Bowel sounds are normal. He exhibits no distension. There is no tenderness.  Musculoskeletal: Normal range of motion. He exhibits no edema or tenderness.  Neurological: He is alert and oriented to person, place, and time. He has normal reflexes. No cranial nerve deficit.  Skin: Skin is warm and dry. No  rash noted. No erythema.  Psychiatric: He has a normal mood and affect. His behavior is normal. Judgment and thought content normal.  Vitals reviewed.     BP 131/88 mmHg  Pulse 99  Temp(Src) 97.6 F (36.4 C) (Oral)  Ht 5' 8.25" (1.734 m)  Wt 212 lb (96.163 kg)  BMI 31.98 kg/m2     Assessment & Plan:  1. Right hip pain -Avoid activities that cause  Pain -keep follow up appts with Dr. Sabra Heck and RTO prn - Ambulatory referral to Orthopedic Surgery - traMADol (ULTRAM) 50 MG tablet; Take 2 tablets (100 mg total) by mouth every 12 (twelve) hours as needed.  Dispense: 120 tablet; Refill: 0 - BMP8+EGFR - methylPREDNISolone acetate (DEPO-MEDROL) injection 80 mg; Inject 1 mL (80 mg total) into the muscle once. - ketorolac (TORADOL) 30 MG/ML injection 30 mg; Inject 1 mL (30 mg total) into the muscle once.  [name], FNP

## 2014-07-07 NOTE — Patient Instructions (Signed)
Hip Pain Your hip is the joint between your upper legs and your lower pelvis. The bones, cartilage, tendons, and muscles of your hip joint perform a lot of work each day supporting your body weight and allowing you to move around. Hip pain can range from a minor ache to severe pain in one or both of your hips. Pain may be felt on the inside of the hip joint near the groin, or the outside near the buttocks and upper thigh. You may have swelling or stiffness as well.  HOME CARE INSTRUCTIONS   Take medicines only as directed by your health care provider.  Apply ice to the injured area:  Put ice in a plastic bag.  Place a towel between your skin and the bag.  Leave the ice on for 15-20 minutes at a time, 3-4 times a day.  Keep your leg raised (elevated) when possible to lessen swelling.  Avoid activities that cause pain.  Follow specific exercises as directed by your health care provider.  Sleep with a pillow between your legs on your most comfortable side.  Record how often you have hip pain, the location of the pain, and what it feels like. SEEK MEDICAL CARE IF:   You are unable to put weight on your leg.  Your hip is red or swollen or very tender to touch.  Your pain or swelling continues or worsens after 1 week.  You have increasing difficulty walking.  You have a fever. SEEK IMMEDIATE MEDICAL CARE IF:   You have fallen.  You have a sudden increase in pain and swelling in your hip. MAKE SURE YOU:   Understand these instructions.  Will watch your condition.  Will get help right away if you are not doing well or get worse. Document Released: 06/14/2009 Document Revised: 05/11/2013 Document Reviewed: 08/21/2012 Exit[employer] Patient Information 2015 [employer]. This information is not intended to replace advice given to you by your health care provider. Make sure you discuss any questions you have with your health care provider.  

## 2014-07-14 ENCOUNTER — Encounter: Payer: Self-pay | Admitting: Family Medicine

## 2014-07-14 ENCOUNTER — Ambulatory Visit (INDEPENDENT_AMBULATORY_CARE_PROVIDER_SITE_OTHER): Payer: Medicare Other | Admitting: Family Medicine

## 2014-07-14 VITALS — BP 133/89 | HR 69 | Temp 98.0°F | Ht 68.25 in | Wt 213.0 lb

## 2014-07-14 DIAGNOSIS — I2510 Atherosclerotic heart disease of native coronary artery without angina pectoris: Secondary | ICD-10-CM

## 2014-07-14 DIAGNOSIS — K4090 Unilateral inguinal hernia, without obstruction or gangrene, not specified as recurrent: Secondary | ICD-10-CM

## 2014-07-14 DIAGNOSIS — M25551 Pain in right hip: Secondary | ICD-10-CM | POA: Diagnosis not present

## 2014-07-14 DIAGNOSIS — G8929 Other chronic pain: Secondary | ICD-10-CM | POA: Diagnosis not present

## 2014-07-14 LAB — POCT CBC
Granulocyte percent: 72.4 %G (ref 37–80)
HEMATOCRIT: 48.9 % (ref 43.5–53.7)
HEMOGLOBIN: 16.2 g/dL (ref 14.1–18.1)
Lymph, poc: 2.4 (ref 0.6–3.4)
MCH: 29.9 pg (ref 27–31.2)
MCHC: 33 g/dL (ref 31.8–35.4)
MCV: 90.5 fL (ref 80–97)
MPV: 6.1 fL (ref 0–99.8)
PLATELET COUNT, POC: 260 10*3/uL (ref 142–424)
POC Granulocyte: 8.3 — AB (ref 2–6.9)
POC LYMPH PERCENT: 21.2 %L (ref 10–50)
RBC: 5.41 M/uL (ref 4.69–6.13)
RDW, POC: 12.1 %
WBC: 11.4 10*3/uL — AB (ref 4.6–10.2)

## 2014-07-14 NOTE — Progress Notes (Signed)
   Subjective:    Patient ID: Anthony Form., male    DOB: 1950/03/02, 64 y.o.   MRN: [id number]  HPI 64 year old male with right hip pain. At his last visit here he was started on tramadol and given injection of steroid; the pain has improved but he is now constipated. This could be a side effect of tramadol. Pain is not so bad in the morning but is worse the more he moves and worse by end of the day. I think this is consistent with degenerative arthritis which is x-ray did show. We spent some time talking about the progression of arthritis, but as long as tramadol helps will use that.   Patient Active Problem List   Diagnosis Date Noted  . Chronic right hip pain 06/23/2014  . S/P bilateral inguinal hernia repair 10/30/2013  . Nodule of left lung 05/29/2013  . Bilateral inguinal hernia 05/28/2013  . Umbilical hernia 91/91/6606  . Major psychotic depression, recurrent 12/16/2012  . Inadequate anticoagulation 04/29/2012  . Hypocalcemia 07/28/2010  . CAD (coronary artery disease)   . HLD (hyperlipidemia)   . COPD (chronic obstructive pulmonary disease)   . Tobacco abuse   . Depression    Outpatient Encounter Prescriptions as of 07/14/2014  Medication Sig  . acetaminophen (TYLENOL) 325 MG tablet Take 650 mg by mouth every 6 (six) hours as needed.  Marland Kitchen aspirin EC 81 MG tablet Take 81 mg by mouth 2 (two) times daily.   Marland Kitchen glucosamine-chondroitin 500-400 MG tablet Take 1 tablet by mouth 3 (three) times daily.  . LUTEIN PO Take 1-2 tablets by mouth every morning.  . Omega-3 Fatty Acids (FISH OIL PO) Take by mouth.  . Vitamin D, Cholecalciferol, 1000 UNITS CAPS Take 1 capsule by mouth.  . traMADol (ULTRAM) 50 MG tablet Take 2 tablets (100 mg total) by mouth every 12 (twelve) hours as needed. (Patient not taking: Reported on 07/14/2014)   No facility-administered encounter medications on file as of 07/14/2014.     Review of Systems  Constitutional: Negative.   Respiratory: Negative.     Cardiovascular: Negative.   Gastrointestinal: Positive for constipation.  Psychiatric/Behavioral: Negative.        Objective:   Physical Exam  Constitutional: He is oriented to person, place, and time. He appears well-developed and well-nourished.  Cardiovascular: Normal rate.   Pulmonary/Chest: Effort normal.  Musculoskeletal:  Right hip has normal range of motion. I can detect no crepitance.  Neurological: He is alert and oriented to person, place, and time.    BP 133/89 mmHg  Pulse 69  Temp(Src) 98 F (36.7 C) (Oral)  Ht 5' 8.25" (1.734 m)  Wt 213 lb (96.616 kg)  BMI 32.13 kg/m2       Assessment & Plan:  1. Unilateral inguinal hernia without obstruction or gangrene, recurrence not specified Scheduled for hernia repair in the near future - POCT CBC - BMP8+EGFR  2. Chronic right hip pain I believe this is osteoarthritis. Continue tramadol. Add senna S and titrate as needed for constipation  Wardell Honour MD

## 2014-07-15 ENCOUNTER — Ambulatory Visit: Payer: Medicare Other | Admitting: Family Medicine

## 2014-07-15 LAB — BMP8+EGFR
BUN/Creatinine Ratio: 15 (ref 10–22)
BUN: 13 mg/dL (ref 8–27)
CALCIUM: 9 mg/dL (ref 8.6–10.2)
CO2: 20 mmol/L (ref 18–29)
CREATININE: 0.87 mg/dL (ref 0.76–1.27)
Chloride: 101 mmol/L (ref 97–108)
GFR calc Af Amer: 105 mL/min/{1.73_m2} (ref >59)
GFR calc non Af Amer: 91 mL/min/{1.73_m2} (ref >59)
Glucose: 128 mg/dL — ABNORMAL HIGH (ref 65–99)
Potassium: 4.9 mmol/L (ref 3.5–5.2)
Sodium: 139 mmol/L (ref 134–144)

## 2014-07-20 NOTE — Progress Notes (Signed)
Please put orders in Epic for Same day surgery 07-26-14 unable to come for a pre op visit Thanks

## 2014-07-21 ENCOUNTER — Encounter (HOSPITAL_COMMUNITY): Payer: Self-pay | Admitting: *Deleted

## 2014-07-22 ENCOUNTER — Ambulatory Visit: Payer: Self-pay | Admitting: General Surgery

## 2014-07-22 NOTE — H&P (Signed)
[place] 05/26/2014 10:47 AM Location: [facility] Patient #: [id number] DOB: 26-May-1950 Divorced / Language: [name] / Race: White Male  History of Present Illness Randall Hiss M. Ocie Stanzione MD; 05/26/2014 11:18 AM) Patient words: tick bite.  The patient is a 64 year old male who presents for an evaluation of a hernia. He was initially scheduled for open repair of recurrent left inguinal hernia earlier this week however his surgery was canceled when he alerted [place] that he he might have been bit by a tic. He states that he is felt weak and tired. He denies any fevers or chills. He has a follow-up appointment with his primary care physician next week. He also stated that he had developed a rash in his groin area. That was another reason we canceled the surgery. He states that the rash is gotten better. He also thought that there might be some tic bites on his scrotum as well because he found on HIS SCROTUM. He denies any dysuria. He is a longer smoking but he is using patches and Wellbutrin. He reports a good appetite. He denies any lightheadedness or dizziness   Problem List/Past Medical Gayland Curry, MD; 05/26/2014 11:20 AM) RECURRENT LEFT INGUINAL HERNIA (550.91  K40.91) BIPOLAR DEPRESSION (296.50  F31.30) GLUCOSE INTOLERANCE (IMPAIRED GLUCOSE TOLERANCE) (790.22  R73.02) HISTORY OF DVT (DEEP VEIN THROMBOSIS) (V12.51  Z86.718) JOCK ITCH (110.3  B35.6) TOBACCO DEPENDENCE (305.1  F17.200) NOCTURIA ASSOCIATED WITH BENIGN PROSTATIC HYPERTROPHY (600.01  N40.1) GROIN SWELLING (789.30  R19.09) S/P HERNIA SURGERY (V45.89  Z98.89) POSTOP CHECK (V67.00  Z09) CHRONIC BRONCHITIS, UNSPECIFIED CHRONIC BRONCHITIS TYPE (491.9  J42) CORONARY ATHEROSCLEROSIS DUE TO CALCIFIED CORONARY LESION (414.00  I25.10) BILATERAL RECURRENT INGUINAL HERNIA WITH OBSTRUCTION AND WITHOUT GANGRENE (749.44  H67.59) UMBILICAL HERNIA WITHOUT OBSTRUCTION AND WITHOUT GANGRENE (553.1   K42.9)  Other Problems Gayland Curry, MD; 05/26/2014 11:20 AM) Congestive Heart Failure Alcohol Abuse Anxiety Disorder Hypercholesterolemia Inguinal Hernia Other disease, cancer, significant illness Umbilical Hernia Repair  Past Surgical History Gayland Curry, MD; 05/26/2014 11:20 AM) Vasectomy  Diagnostic Studies History Gayland Curry, MD; 05/26/2014 11:20 AM) Colonoscopy never  Allergies Elbert Ewings, CMA; 05/26/2014 10:47 AM) Penicillins  Medication History Elbert Ewings, CMA; 05/26/2014 10:47 AM) Flomax (0.4MG  Capsule, 1 (one) Oral daily, Taken starting 05/13/2014) Active. (start taking 2 weeks before hernia surgery) Xarelto (20MG  Tablet, Oral daily) Active. Aspirin Childrens (81MG  Tablet Chewable, Oral daily) Active. Atarax (25MG  Tablet, Oral daily) Active. Mevacor (40MG  Tablet, Oral daily) Active. Lopressor (50MG  Tablet, .5 Oral daily) Active. TraZODone HCl (50MG  Tablet, Oral daily) Active. Valerian Root Plus (Oral daily) Active. Medications Reconciled  Social History Gayland Curry, MD; 05/26/2014 11:20 AM) Tobacco use Current every day smoker. Alcohol use Remotely quit alcohol use. Caffeine use Coffee. Illicit drug use Remotely quit drug use.  Family History Gayland Curry, MD; 05/26/2014 11:20 AM) Depression Father, Mother. Alcohol Abuse Father, Mother, Sister.  Vitals Elbert Ewings CMA; 05/26/2014 10:48 AM) 05/26/2014 10:47 AM Weight: 210 lb Height: 70in Body Surface Area: 2.17 m Body Mass Index: 30.13 kg/m Temp.: 98.72F(Oral)  Pulse: 80 (Regular)  Resp.: 17 (Unlabored)  BP: 130/72 (Sitting, Left Arm, Standard)    Physical Exam Randall Hiss M. Shavonte Zhao MD; 05/26/2014 11:16 AM) General Mental Status-Alert. General Appearance-Consistent with stated age. Hydration-Well hydrated. Voice-Normal.  Chest and Lung Exam Chest and lung exam reveals -quiet, even and easy respiratory effort with no use of accessory muscles and  on auscultation, normal breath sounds, no adventitious sounds and normal  vocal resonance. Inspection Chest Wall - Normal. Back - normal.  Cardiovascular Cardiovascular examination reveals -normal heart sounds, regular rate and rhythm with no murmurs, carotid auscultation reveals no bruits and normal pedal pulses bilaterally.  Abdomen Inspection Skin - Scar - Note: Incision: clean, dry, and intact. Palpation/Percussion Palpation and Percussion of the abdomen reveal - Soft, Non Tender, No Rebound tenderness, No Rigidity (guarding) and No hepatosplenomegaly. Auscultation Auscultation of the abdomen reveals - Bowel sounds normal. Note: Well-healed umbilical and abdominal wall trocar sites. The obese; no cellulitis, induration or fluctuance. Both testicles are down. Full left scrotum which was present preoperatively no sign of hernia recurrence; some skin rash on L inner thigh c/w jock itch. scrotum - normal - no cellulitis, induration, fluctuance     Assessment & Plan Randall Hiss M. Rockford Leinen MD; 05/26/2014 11:20 AM) RECURRENT LEFT INGUINAL HERNIA (550.91  K40.91) Impression: We will delay repair of his hernia until he is evaluated and cleared by his primary care physician as well as when he feels symptomatically better. Current Plans  Pt Education - CCS Free Text Education/Instructions: discussed with patient and provided information. HISTORY OF DVT (DEEP VEIN THROMBOSIS) (V12.51  Z86.718) Impression: He is not sure why he is on blood thinners long-term. He reports one isolated DVT after traumatic fall. I encouraged him to discuss this with his primary care physician. We discussed indications for long-term blood thinner usage such as genetic mutations and/or multiple prior blood clots. To his knowledge he has only had 1 lower extremity DVT. Current Plans Pt Education - CCS Free Text Education/Instructions: discussed with patient and provided information. TOBACCO DEPENDENCE (305.1   F17.200) Impression: congratulated pt on his efforts to stop smoking Current Plans Pt Education - CCS Free Text Education/Instructions: discussed with patient and provided information. BIPOLAR DEPRESSION (296.50  F31.30) JOCK ITCH (110.3  B35.6) Impression: He has some mild jock itch. I informed him to get a cream over-the-counter for jock itch and a follow package directions. Current Plans Pt Education - CCS Free Text Education/Instructions: discussed with patient and provided informat  Leighton Ruff. Redmond Pulling, MD, FACS General, Bariatric, & Minimally Invasive Surgery [facility], [place]

## 2014-07-26 ENCOUNTER — Observation Stay (HOSPITAL_COMMUNITY)
Admission: RE | Admit: 2014-07-26 | Discharge: 2014-07-27 | Disposition: A | Discharge status: Home / Self Care | Payer: Medicare Other | Source: Ambulatory Visit | Attending: General Surgery | Admitting: General Surgery

## 2014-07-26 ENCOUNTER — Ambulatory Visit (HOSPITAL_COMMUNITY): Payer: Medicare Other | Admitting: Certified Registered Nurse Anesthetist

## 2014-07-26 ENCOUNTER — Encounter (HOSPITAL_COMMUNITY)
Admission: RE | Disposition: A | Discharge status: Home / Self Care | Payer: Self-pay | Source: Ambulatory Visit | Attending: General Surgery

## 2014-07-26 ENCOUNTER — Encounter (HOSPITAL_COMMUNITY): Payer: Self-pay

## 2014-07-26 DIAGNOSIS — I2510 Atherosclerotic heart disease of native coronary artery without angina pectoris: Secondary | ICD-10-CM | POA: Insufficient documentation

## 2014-07-26 DIAGNOSIS — F419 Anxiety disorder, unspecified: Secondary | ICD-10-CM | POA: Insufficient documentation

## 2014-07-26 DIAGNOSIS — Z79899 Other long term (current) drug therapy: Secondary | ICD-10-CM | POA: Diagnosis not present

## 2014-07-26 DIAGNOSIS — G8929 Other chronic pain: Secondary | ICD-10-CM | POA: Diagnosis not present

## 2014-07-26 DIAGNOSIS — F329 Major depressive disorder, single episode, unspecified: Secondary | ICD-10-CM | POA: Insufficient documentation

## 2014-07-26 DIAGNOSIS — K4091 Unilateral inguinal hernia, without obstruction or gangrene, recurrent: Principal | ICD-10-CM | POA: Insufficient documentation

## 2014-07-26 DIAGNOSIS — Z86718 Personal history of other venous thrombosis and embolism: Secondary | ICD-10-CM | POA: Diagnosis not present

## 2014-07-26 DIAGNOSIS — E785 Hyperlipidemia, unspecified: Secondary | ICD-10-CM | POA: Diagnosis not present

## 2014-07-26 DIAGNOSIS — Z7982 Long term (current) use of aspirin: Secondary | ICD-10-CM | POA: Insufficient documentation

## 2014-07-26 DIAGNOSIS — F319 Bipolar disorder, unspecified: Secondary | ICD-10-CM | POA: Diagnosis not present

## 2014-07-26 DIAGNOSIS — J449 Chronic obstructive pulmonary disease, unspecified: Secondary | ICD-10-CM | POA: Diagnosis not present

## 2014-07-26 DIAGNOSIS — R45851 Suicidal ideations: Secondary | ICD-10-CM | POA: Insufficient documentation

## 2014-07-26 DIAGNOSIS — F333 Major depressive disorder, recurrent, severe with psychotic symptoms: Secondary | ICD-10-CM | POA: Diagnosis present

## 2014-07-26 DIAGNOSIS — F17210 Nicotine dependence, cigarettes, uncomplicated: Secondary | ICD-10-CM | POA: Diagnosis not present

## 2014-07-26 DIAGNOSIS — M25551 Pain in right hip: Secondary | ICD-10-CM | POA: Insufficient documentation

## 2014-07-26 DIAGNOSIS — B356 Tinea cruris: Secondary | ICD-10-CM | POA: Diagnosis not present

## 2014-07-26 DIAGNOSIS — Z7901 Long term (current) use of anticoagulants: Secondary | ICD-10-CM | POA: Insufficient documentation

## 2014-07-26 HISTORY — DX: Unspecified osteoarthritis, unspecified site: M19.90

## 2014-07-26 HISTORY — DX: Frequency of micturition: R35.0

## 2014-07-26 HISTORY — PX: INGUINAL HERNIA REPAIR: SHX194

## 2014-07-26 HISTORY — PX: LAPAROSCOPY: SHX197

## 2014-07-26 LAB — CBC WITH DIFFERENTIAL/PLATELET
BASOS PCT: 0 % (ref 0–1)
Basophils Absolute: 0 10*3/uL (ref 0.0–0.1)
EOS ABS: 0 10*3/uL (ref 0.0–0.7)
Eosinophils Relative: 0 % (ref 0–5)
HCT: 49.3 % (ref 39.0–52.0)
Hemoglobin: 17.1 g/dL — ABNORMAL HIGH (ref 13.0–17.0)
LYMPHS PCT: 19 % (ref 12–46)
Lymphs Abs: 1.8 10*3/uL (ref 0.7–4.0)
MCH: 31 pg (ref 26.0–34.0)
MCHC: 34.7 g/dL (ref 30.0–36.0)
MCV: 89.3 fL (ref 78.0–100.0)
MONO ABS: 0.7 10*3/uL (ref 0.1–1.0)
Monocytes Relative: 7 % (ref 3–12)
Neutro Abs: 7.1 10*3/uL (ref 1.7–7.7)
Neutrophils Relative %: 74 % (ref 43–77)
PLATELETS: 235 10*3/uL (ref 150–400)
RBC: 5.52 MIL/uL (ref 4.22–5.81)
RDW: 12 % (ref 11.5–15.5)
WBC: 9.7 10*3/uL (ref 4.0–10.5)

## 2014-07-26 SURGERY — REPAIR, HERNIA, INGUINAL, ADULT
Anesthesia: General | Site: Groin

## 2014-07-26 MED ORDER — MIDAZOLAM HCL 5 MG/5ML IJ SOLN
INTRAMUSCULAR | Status: DC | PRN
  Administered 2014-07-26: 2 mg via INTRAVENOUS

## 2014-07-26 MED ORDER — VANCOMYCIN HCL IN DEXTROSE 1-5 GM/200ML-% IV SOLN
1000.0000 mg | INTRAVENOUS | Status: AC
  Administered 2014-07-26: 1000 mg via INTRAVENOUS
  Filled 2014-07-26: qty 200

## 2014-07-26 MED ORDER — FENTANYL CITRATE (PF) 100 MCG/2ML IJ SOLN
INTRAMUSCULAR | Status: AC
  Filled 2014-07-26: qty 2

## 2014-07-26 MED ORDER — ACETAMINOPHEN 500 MG PO TABS
1000.0000 mg | ORAL_TABLET | Freq: Four times a day (QID) | ORAL | Status: AC
  Administered 2014-07-26 – 2014-07-27 (×4): 1000 mg via ORAL
  Filled 2014-07-26 (×4): qty 2

## 2014-07-26 MED ORDER — LACTATED RINGERS IV SOLN
INTRAVENOUS | Status: DC
  Administered 2014-07-26: 12:00:00 via INTRAVENOUS

## 2014-07-26 MED ORDER — MORPHINE SULFATE 2 MG/ML IJ SOLN
1.0000 mg | INTRAMUSCULAR | Status: DC | PRN
  Administered 2014-07-26 – 2014-07-27 (×2): 2 mg via INTRAVENOUS
  Filled 2014-07-26 (×2): qty 1

## 2014-07-26 MED ORDER — SUGAMMADEX SODIUM 200 MG/2ML IV SOLN
INTRAVENOUS | Status: DC | PRN
  Administered 2014-07-26: 200 mg via INTRAVENOUS

## 2014-07-26 MED ORDER — ONDANSETRON HCL 4 MG/2ML IJ SOLN
INTRAMUSCULAR | Status: AC
  Filled 2014-07-26: qty 2

## 2014-07-26 MED ORDER — MIDAZOLAM HCL 2 MG/2ML IJ SOLN
INTRAMUSCULAR | Status: AC
  Filled 2014-07-26: qty 2

## 2014-07-26 MED ORDER — FENTANYL CITRATE (PF) 100 MCG/2ML IJ SOLN
25.0000 ug | INTRAMUSCULAR | Status: DC | PRN
  Administered 2014-07-26 (×2): 50 ug via INTRAVENOUS

## 2014-07-26 MED ORDER — PHENYLEPHRINE HCL 10 MG/ML IJ SOLN
INTRAMUSCULAR | Status: DC | PRN
  Administered 2014-07-26: 80 ug via INTRAVENOUS

## 2014-07-26 MED ORDER — CHLORHEXIDINE GLUCONATE 4 % EX LIQD: 1.0000 "application " | Freq: Once | CUTANEOUS | Status: DC

## 2014-07-26 MED ORDER — ONDANSETRON HCL 4 MG PO TABS: 4.0000 mg | ORAL_TABLET | Freq: Four times a day (QID) | ORAL | Status: DC | PRN

## 2014-07-26 MED ORDER — ONDANSETRON HCL 4 MG/2ML IJ SOLN: 4.0000 mg | Freq: Four times a day (QID) | INTRAMUSCULAR | Status: DC | PRN

## 2014-07-26 MED ORDER — ONDANSETRON HCL 4 MG/2ML IJ SOLN
INTRAMUSCULAR | Status: DC | PRN
  Administered 2014-07-26: 4 mg via INTRAVENOUS

## 2014-07-26 MED ORDER — NEOSTIGMINE METHYLSULFATE 10 MG/10ML IV SOLN
INTRAVENOUS | Status: AC
  Filled 2014-07-26: qty 1

## 2014-07-26 MED ORDER — BUPIVACAINE-EPINEPHRINE (PF) 0.25% -1:200000 IJ SOLN
INTRAMUSCULAR | Status: AC
  Filled 2014-07-26: qty 30

## 2014-07-26 MED ORDER — FENTANYL CITRATE (PF) 250 MCG/5ML IJ SOLN
INTRAMUSCULAR | Status: AC
  Filled 2014-07-26: qty 5

## 2014-07-26 MED ORDER — GLYCOPYRROLATE 0.2 MG/ML IJ SOLN
INTRAMUSCULAR | Status: AC
  Filled 2014-07-26: qty 3

## 2014-07-26 MED ORDER — HEPARIN SODIUM (PORCINE) 5000 UNIT/ML IJ SOLN
5000.0000 [IU] | Freq: Three times a day (TID) | INTRAMUSCULAR | Status: DC
  Administered 2014-07-26 – 2014-07-27 (×3): 5000 [IU] via SUBCUTANEOUS
  Filled 2014-07-26 (×5): qty 1

## 2014-07-26 MED ORDER — ROCURONIUM BROMIDE 100 MG/10ML IV SOLN
INTRAVENOUS | Status: AC
  Filled 2014-07-26: qty 1

## 2014-07-26 MED ORDER — ROCURONIUM BROMIDE 100 MG/10ML IV SOLN
INTRAVENOUS | Status: DC | PRN
  Administered 2014-07-26: 10 mg via INTRAVENOUS
  Administered 2014-07-26: 20 mg via INTRAVENOUS
  Administered 2014-07-26: 40 mg via INTRAVENOUS
  Administered 2014-07-26 (×2): 10 mg via INTRAVENOUS

## 2014-07-26 MED ORDER — PROPOFOL 10 MG/ML IV BOLUS
INTRAVENOUS | Status: AC
  Filled 2014-07-26: qty 20

## 2014-07-26 MED ORDER — OXYCODONE HCL 5 MG PO TABS
5.0000 mg | ORAL_TABLET | ORAL | Status: DC | PRN
  Administered 2014-07-26: 5 mg via ORAL
  Administered 2014-07-27 (×3): 10 mg via ORAL
  Filled 2014-07-26 (×3): qty 2
  Filled 2014-07-26: qty 1

## 2014-07-26 MED ORDER — BUPIVACAINE-EPINEPHRINE 0.25% -1:200000 IJ SOLN
INTRAMUSCULAR | Status: DC | PRN
  Administered 2014-07-26: 30 mL

## 2014-07-26 MED ORDER — PHENYLEPHRINE 40 MCG/ML (10ML) SYRINGE FOR IV PUSH (FOR BLOOD PRESSURE SUPPORT)
PREFILLED_SYRINGE | INTRAVENOUS | Status: AC
  Filled 2014-07-26: qty 10

## 2014-07-26 MED ORDER — LIDOCAINE HCL (CARDIAC) 20 MG/ML IV SOLN
INTRAVENOUS | Status: AC
  Filled 2014-07-26: qty 5

## 2014-07-26 MED ORDER — KCL IN DEXTROSE-NACL 20-5-0.45 MEQ/L-%-% IV SOLN
INTRAVENOUS | Status: DC
  Administered 2014-07-26: 17:00:00 via INTRAVENOUS
  Administered 2014-07-27: 50 mL/h via INTRAVENOUS
  Filled 2014-07-26 (×3): qty 1000

## 2014-07-26 MED ORDER — PHENOL 1.4 % MT LIQD: 1.0000 | OROMUCOSAL | Status: DC | PRN

## 2014-07-26 MED ORDER — DOCUSATE SODIUM 100 MG PO CAPS
100.0000 mg | ORAL_CAPSULE | Freq: Two times a day (BID) | ORAL | Status: DC
  Administered 2014-07-26 – 2014-07-27 (×3): 100 mg via ORAL
  Filled 2014-07-26 (×4): qty 1

## 2014-07-26 MED ORDER — FENTANYL CITRATE (PF) 100 MCG/2ML IJ SOLN
INTRAMUSCULAR | Status: DC | PRN
  Administered 2014-07-26 (×2): 50 ug via INTRAVENOUS
  Administered 2014-07-26: 100 ug via INTRAVENOUS
  Administered 2014-07-26: 50 ug via INTRAVENOUS

## 2014-07-26 MED ORDER — PANTOPRAZOLE SODIUM 40 MG IV SOLR
40.0000 mg | INTRAVENOUS | Status: DC
  Administered 2014-07-26: 40 mg via INTRAVENOUS
  Filled 2014-07-26 (×2): qty 40

## 2014-07-26 MED ORDER — LIDOCAINE HCL (CARDIAC) 20 MG/ML IV SOLN
INTRAVENOUS | Status: DC | PRN
  Administered 2014-07-26: 50 mg via INTRAVENOUS

## 2014-07-26 MED ORDER — PROPOFOL 10 MG/ML IV BOLUS
INTRAVENOUS | Status: DC | PRN
Start: 2014-07-26 — End: 2014-07-26
  Administered 2014-07-26: 150 mg via INTRAVENOUS

## 2014-07-26 MED ORDER — 0.9 % SODIUM CHLORIDE (POUR BTL) OPTIME
TOPICAL | Status: DC | PRN
  Administered 2014-07-26: 1000 mL

## 2014-07-26 MED ORDER — PROMETHAZINE HCL 25 MG/ML IJ SOLN: 6.2500 mg | INTRAMUSCULAR | Status: DC | PRN

## 2014-07-26 MED ORDER — SUGAMMADEX SODIUM 200 MG/2ML IV SOLN
INTRAVENOUS | Status: AC
  Filled 2014-07-26: qty 2

## 2014-07-26 MED ORDER — MEPERIDINE HCL 50 MG/ML IJ SOLN: 6.2500 mg | INTRAMUSCULAR | Status: DC | PRN

## 2014-07-26 SURGICAL SUPPLY — 85 items
APL SKNCLS STERI-STRIP NONHPOA (GAUZE/BANDAGES/DRESSINGS) ×2
APPLIER CLIP 5 13 M/L LIGAMAX5 (MISCELLANEOUS)
APPLIER CLIP ROT 10 11.4 M/L (STAPLE)
APR CLP MED LRG 11.4X10 (STAPLE)
APR CLP MED LRG 5 ANG JAW (MISCELLANEOUS)
BENZOIN TINCTURE PRP APPL 2/3 (GAUZE/BANDAGES/DRESSINGS) ×4 IMPLANT
BLADE EXTENDED COATED 6.5IN (ELECTRODE) IMPLANT
BLADE HEX COATED 2.75 (ELECTRODE) IMPLANT
BLADE SURG SZ10 CARB STEEL (BLADE) IMPLANT
BLADE SURG SZ12 CARB STEEL (BLADE) ×3 IMPLANT
BLADE SURG SZ20 CARB STEEL (BLADE) ×3 IMPLANT
CLIP APPLIE 5 13 M/L LIGAMAX5 (MISCELLANEOUS) IMPLANT
CLIP APPLIE ROT 10 11.4 M/L (STAPLE) IMPLANT
CLOSURE STERI-STRIP 1/4X4 (GAUZE/BANDAGES/DRESSINGS) ×4 IMPLANT
CLOSURE WOUND 1/2 X4 (GAUZE/BANDAGES/DRESSINGS)
COVER MAYO STAND STRL (DRAPES) ×1 IMPLANT
COVER SURGICAL LIGHT HANDLE (MISCELLANEOUS) ×1 IMPLANT
DECANTER SPIKE VIAL GLASS SM (MISCELLANEOUS) ×1 IMPLANT
DRAIN PENROSE 18X1/2 LTX STRL (DRAIN) ×2 IMPLANT
DRAPE LAPAROSCOPIC ABDOMINAL (DRAPES) ×4 IMPLANT
DRAPE PED LAPAROTOMY (DRAPES) ×1 IMPLANT
DRAPE UTILITY XL STRL (DRAPES) ×4 IMPLANT
DRAPE WARM FLUID 44X44 (DRAPE) IMPLANT
DRSG TEGADERM 2-3/8X2-3/4 SM (GAUZE/BANDAGES/DRESSINGS) ×4 IMPLANT
DRSG TEGADERM 4X4.75 (GAUZE/BANDAGES/DRESSINGS) ×4 IMPLANT
ELECT REM PT RETURN 9FT ADLT (ELECTROSURGICAL) ×4
ELECTRODE REM PT RTRN 9FT ADLT (ELECTROSURGICAL) ×2 IMPLANT
FILTER SMOKE EVAC LAPAROSHD (FILTER) IMPLANT
GAUZE SPONGE 4X4 12PLY STRL (GAUZE/BANDAGES/DRESSINGS) IMPLANT
GAUZE SPONGE 4X4 16PLY XRAY LF (GAUZE/BANDAGES/DRESSINGS) ×2 IMPLANT
GLOVE BIO SURGEON STRL SZ7 (GLOVE) ×4 IMPLANT
GLOVE BIO SURGEON STRL SZ7.5 (GLOVE) ×4 IMPLANT
GLOVE BIOGEL M 7.0 STRL (GLOVE) ×4 IMPLANT
GLOVE BIOGEL M STRL SZ7.5 (GLOVE) IMPLANT
GLOVE BIOGEL PI IND STRL 7.0 (GLOVE) ×2 IMPLANT
GLOVE BIOGEL PI INDICATOR 7.0 (GLOVE) ×2
GLOVE INDICATOR 8.0 STRL GRN (GLOVE) IMPLANT
GOWN STRL REUS W/ TWL XL LVL3 (GOWN DISPOSABLE) ×4 IMPLANT
GOWN STRL REUS W/TWL LRG LVL3 (GOWN DISPOSABLE) ×8 IMPLANT
GOWN STRL REUS W/TWL XL LVL3 (GOWN DISPOSABLE) ×20 IMPLANT
KIT BASIN OR (CUSTOM PROCEDURE TRAY) ×4 IMPLANT
LIGASURE IMPACT 36 18CM CVD LR (INSTRUMENTS) IMPLANT
LIQUID BAND (GAUZE/BANDAGES/DRESSINGS) IMPLANT
MESH ULTRAPRO 3X6 7.6X15CM (Mesh General) ×3 IMPLANT
NEEDLE HYPO 22GX1.5 SAFETY (NEEDLE) ×4 IMPLANT
NS IRRIG 1000ML POUR BTL (IV SOLUTION) ×4 IMPLANT
PACK GENERAL/GYN (CUSTOM PROCEDURE TRAY) ×4 IMPLANT
PENCIL BUTTON HOLSTER BLD 10FT (ELECTRODE) IMPLANT
SET IRRIG TUBING LAPAROSCOPIC (IRRIGATION / IRRIGATOR) IMPLANT
SHEARS HARMONIC ACE PLUS 36CM (ENDOMECHANICALS) IMPLANT
SOLUTION ANTI FOG 6CC (MISCELLANEOUS) ×4 IMPLANT
SPONGE LAP 18X18 X RAY DECT (DISPOSABLE) ×3 IMPLANT
STAPLER VISISTAT 35W (STAPLE) IMPLANT
STRIP CLOSURE SKIN 1/2X4 (GAUZE/BANDAGES/DRESSINGS) IMPLANT
SUCTION POOLE TIP (SUCTIONS) IMPLANT
SUT MNCRL AB 4-0 PS2 18 (SUTURE) ×8 IMPLANT
SUT PDS AB 1 TP1 96 (SUTURE) IMPLANT
SUT PROLENE 2 0 CT2 30 (SUTURE) ×14 IMPLANT
SUT PROLENE 2 0 KS (SUTURE) IMPLANT
SUT PROLENE 2 0 SH DA (SUTURE) IMPLANT
SUT SILK 2 0 (SUTURE)
SUT SILK 2 0 SH CR/8 (SUTURE) IMPLANT
SUT SILK 2-0 18XBRD TIE 12 (SUTURE) IMPLANT
SUT SILK 3 0 (SUTURE)
SUT SILK 3 0 SH CR/8 (SUTURE) IMPLANT
SUT SILK 3-0 18XBRD TIE 12 (SUTURE) IMPLANT
SUT VIC AB 2-0 SH 27 (SUTURE) ×4
SUT VIC AB 2-0 SH 27X BRD (SUTURE) ×2 IMPLANT
SUT VIC AB 3-0 SH 18 (SUTURE) ×4 IMPLANT
SUT VICRYL 0 UR6 27IN ABS (SUTURE) ×3 IMPLANT
SYR CONTROL 10ML LL (SYRINGE) IMPLANT
SYRINGE 20CC LL (MISCELLANEOUS) ×3 IMPLANT
SYS LAPSCP GELPORT 120MM (MISCELLANEOUS)
SYSTEM LAPSCP GELPORT 120MM (MISCELLANEOUS) IMPLANT
TOWEL OR 17X26 10 PK STRL BLUE (TOWEL DISPOSABLE) ×4 IMPLANT
TRAY FOLEY W/METER SILVER 14FR (SET/KITS/TRAYS/PACK) ×4 IMPLANT
TRAY LAPAROSCOPIC (CUSTOM PROCEDURE TRAY) ×1 IMPLANT
TROCAR BLADELESS OPT 5 75 (ENDOMECHANICALS) IMPLANT
TROCAR XCEL 12X100 BLDLESS (ENDOMECHANICALS) ×4 IMPLANT
TROCAR XCEL BLUNT TIP 100MML (ENDOMECHANICALS) IMPLANT
TROCAR XCEL NON-BLD 11X100MML (ENDOMECHANICALS) IMPLANT
TROCAR XCEL UNIV SLVE 11M 100M (ENDOMECHANICALS) IMPLANT
TUBING INSUFFLATION 10FT LAP (TUBING) ×4 IMPLANT
YANKAUER SUCT BULB TIP 10FT TU (MISCELLANEOUS) IMPLANT
YANKAUER SUCT BULB TIP NO VENT (SUCTIONS) IMPLANT

## 2014-07-26 NOTE — Op Note (Signed)
Anthony [name]. [birth year] [id number] 07/26/2014   Diagnostic laparoscopy, Open Repair of Recurrent Left Indirect and Direct Inguinal Hernia with Mesh Procedure Note  Indications: The patient presented with a history of a recurrent left, reducible hernia. Please see previous notes. He had previously undergone laparoscopic repair of left direct/indirect inguinal hernia and right inguinal hernia with mesh and open umbilical hernia repair primarily in the fall of 2015. A ct confirmed recurrent left inguinal hernia  Pre-operative Diagnosis:  Recurrent left inguinal hernia  Post-operative Diagnosis: recurrent left direct/inguinal hernia   Surgeon: Gayland Curry   Assistants: none  Anesthesia: General endotracheal anesthesia  ASA Class: 3   Surgeon: Leighton Ruff. Redmond Pulling, MD, FACS  Procedure Details  The patient was seen again in the Holding Room. The risks, benefits, complications, treatment options, and expected outcomes were discussed with the patient. The possibilities of reaction to medication, pulmonary aspiration, perforation of viscus, bleeding, recurrent infection, the need for additional procedures, and development of a complication requiring transfusion or further operation were discussed with the patient and/or family. The likelihood of success in repairing the hernia and returning the patient to their previous functional status is good.  There was concurrence with the proposed plan, and informed consent was obtained. The site of surgery was properly noted/marked. The patient was taken to the Operating Room, identified as Anthony [name]., and the procedure verified as recurrent left inguinal hernia repair and diagnostic laparoscopy. A Time Out was held and the above information confirmed.  The patient was placed in the supine position and underwent induction of anesthesia. The lower abdomen and groin was prepped with Chloraprep and draped in the standard fashion Local anesthetic agent  was injected into the skin near the umbilicus and an incision made thru his old infraumbilical scar. We dissected down to the abdominal fascia with blunt dissection.  The fascia was incised vertically and we entered the peritoneal cavity bluntly.  A pursestring suture of 0-Vicryl was placed around the fascial opening.  The Hasson cannula was inserted and secured with the stay suture.  Pneumoperitoneum was then created with CO2 and tolerated well without any adverse changes in the patient's vital signs. The right groin was inspected and the previously placed ethicon ultrapro mesh was inplace and flat without evidence of recurrence. Mesh was visualized in the left groin however it was lateral and no longer covering the indirect or direct spaces. There was evidence of a recurrent direct and indirect left inguinal hernia. Moreover the mesh in the left groin had lateralized and appeared contractured. At this point pneumoperitoneum was released.  An oblique incision was made in the left groin. Dissection was carried down through the subcutaneous tissue with cautery to the external oblique fascia.  We opened the external oblique fascia along the direction of its fibers to the external ring.  The spermatic cord was circumferentially dissected bluntly and retracted with a Penrose drain.  The floor of the inguinal canal was inspected and there was a large direct defect.  We skeletonized the spermatic cord and isolated a small hernia sac and reduced it back into the abdomen. For the direct defect I brought together the conjoined tendon down to the shelving edge of the inguinal ligament with 3 interrupted 2-0 proline sutures.  We then used a 3 x 6 inch piece of Ultrapro mesh, which was cut into a keyhole shape.  This was secured with 2-0 Prolene, beginning at the pubic tubercle, running this along the shelving edge inferiorly. Superiorly,  the mesh was secured to the internal oblique fascia with interrupted 2-0 Prolene  sutures.  The tails of the mesh were sutured together behind the spermatic cord.  The mesh was tucked underneath the external oblique fascia laterally.  The external oblique fascia was reapproximated with 2-0 Vicryl.  3-0 Vicryl was used to close the subcutaneous tissues and 4-0 Monocryl was used to close the skin in subcuticular fashion. I reestablished pneumoperitoneum and inspected the hernia repair. It appeared that the direct and indirect defects had been closed. Pneumoperitoneum was released. The previously placed pursestring suture was tied down thus obliterating the umbilical fascial defect. There was no palpable defect. Skin was closed with a 4-0 Monocryl. Benzoin and steri-strips were used to seal the incision.  A clean dressing was applied.  The patient was then extubated and brought to the recovery room in stable condition.  All sponge, instrument, and needle counts were correct prior to closure and at the conclusion of the case.   Estimated Blood Loss: Minimal                 Complications: None; patient tolerated the procedure well.         Disposition: PACU - hemodynamically stable.         Condition: stable  Leighton Ruff. Redmond Pulling, MD, FACS General, Bariatric, & Minimally Invasive Surgery [facility], [place]

## 2014-07-26 NOTE — Anesthesia Procedure Notes (Signed)
Procedure Name: Intubation Date/Time: 07/26/2014 1:22 PM Performed by: Deliah Boston Pre-anesthesia Checklist: Patient identified, Emergency Drugs available, Suction available and Patient being monitored Patient Re-evaluated:Patient Re-evaluated prior to inductionOxygen Delivery Method: Circle System Utilized Preoxygenation: Pre-oxygenation with 100% oxygen Intubation Type: IV induction Ventilation: Mask ventilation without difficulty Laryngoscope Size: Mac and 4 Grade View: Grade I Tube type: Oral Tube size: 8.0 mm Number of attempts: 1 Airway Equipment and Method: Oral airway Placement Confirmation: ETT inserted through vocal cords under direct vision,  positive ETCO2 and breath sounds checked- equal and bilateral Secured at: 21 cm Tube secured with: Tape Dental Injury: Teeth and Oropharynx as per pre-operative assessment

## 2014-07-26 NOTE — Anesthesia Preprocedure Evaluation (Signed)
Anesthesia Evaluation  Patient identified by MRN, date of birth, ID band Patient awake    Reviewed: Allergy & Precautions, NPO status , Patient's Chart, lab work & pertinent test results  Airway Mallampati: II  TM Distance: >3 FB Neck ROM: Full    Dental no notable dental hx.    Pulmonary COPDCurrent Smoker,  breath sounds clear to auscultation  Pulmonary exam normal       Cardiovascular hypertension, + CAD, + Past MI and + Cardiac Stents (2012) Normal cardiovascular examRhythm:Regular Rate:Normal     Neuro/Psych Bipolar Disorder negative neurological ROS  negative psych ROS   GI/Hepatic negative GI ROS, Neg liver ROS,   Endo/Other  negative endocrine ROS  Renal/GU negative Renal ROS  negative genitourinary   Musculoskeletal negative musculoskeletal ROS (+)   Abdominal   Peds negative pediatric ROS (+)  Hematology negative hematology ROS (+)   Anesthesia Other Findings   Reproductive/Obstetrics negative OB ROS                             Anesthesia Physical Anesthesia Plan  ASA: III  Anesthesia Plan: General   Post-op Pain Management:    Induction: Intravenous  Airway Management Planned: Oral ETT  Additional Equipment:   Intra-op Plan:   Post-operative Plan: Extubation in OR  Informed Consent: I have reviewed the patients History and Physical, chart, labs and discussed the procedure including the risks, benefits and alternatives for the proposed anesthesia with the patient or authorized representative who has indicated his/her understanding and acceptance.   Dental advisory given  Plan Discussed with: CRNA  Anesthesia Plan Comments:         Anesthesia Quick Evaluation

## 2014-07-26 NOTE — Clinical Social Work Note (Signed)
Clinical Social Work Assessment  Patient Details  Name: Anthony [name]. MRN: [id number] Date of Birth: [birth year]  Date of referral:  07/26/14               Reason for consult:  Suicide Risk/Attempt                Permission sought to share information with:   (None.) Permission granted to share information::  No  Name::        Agency::     Relationship::     Contact Information:     Housing/Transportation Living arrangements for the past 2 months:  Single Family Home Source of Information:  Patient Patient Interpreter Needed:  None Criminal Activity/Legal Involvement Pertinent to Current Situation/Hospitalization:  No - Comment as needed Significant Relationships:  Adult Children Lives with:  Self Do you feel safe going back to the place where you live?  Yes Need for family participation in patient care:  Yes (Comment) (CSW believes it will be helpful family is involved with pt.)  Care giving concerns:  The pt informed CSW that he lives alone in [place], [place]. Patient informed CSW that he completes his ADL's independently. Patient states that he has gotten behind on his cleaning because he hasn't been feeling well.   Patient informed CSW that he feels safe to return home.   Social Worker assessment / plan:  CSW met with pt at bedside. There was no family present. CSW was consulted by nurse to speak with pt due to him voicing SI thoughts today. Patient states that he presents to hospital due to hernia. CSW spoke with nurse who informed her that he had surgery today.  Patient states that he has fallen x1 in the past 6 months. Patient informed CSW that he fell outside his house due to the steps being wet from rain. Patient states that his daughter is his primary support. Patient states that his daughter calls his at least once a week.  However, he states that she visits him x3 out the year. Patient states that his daughter also lives in [place], [place]. He states that his daughter lives 1  mile from him.  Patient denies SI/HI and AVH. Patient informed CSW that he is not feeling suicidal today. He states that he was feeling suicidal last week, but did not have a plan. Patient mentioned that if he did attempt suicide he would make it seem accidental. Patient states that he was a client at [facility] in the past. He informed CSW that he does not have a psychiatrist or a mental health provider now. Patient states that he is not taking any psychiatric medications.  Patient informed CSW that he feels safe to return home. Patient states that he does not have any questions at this time.    Employment status:    Insurance information:  Medicare PT Recommendations:  Not assessed at this time Information / Referral to community resources:   (CSW will give pt a list of outpatient psychiatric providers.)  Patient/Family's Response to care:  Patient is on the 5th floor. Patient is currently appropriate at this time.  Patient/Family's Understanding of and Emotional Response to Diagnosis, Current Treatment, and Prognosis:  Patient is understanding of his diagnosis. Patient denies SI HI and AVH during this interview.   Emotional Assessment Appearance:  Appears stated age Attitude/Demeanor/Rapport:   (Appropriate. ) Affect (typically observed):  Pleasant, Appropriate Orientation:  Oriented to Self, Oriented to Place, Oriented to  Time, Oriented to  Situation Alcohol / Substance use:    Psych involvement (Current and /or in the community):  Yes (Comment) (Patient states that he has been a client at [facility] in the past.)  Discharge Needs  Concerns to be addressed:  Adjustment to Illness, Mental Health Concerns Readmission within the last 30 days:  No Current discharge risk:  None Barriers to Discharge:  No Barriers Identified   Bernita Buffy, LCSW 07/26/2014, 10:13 PM

## 2014-07-26 NOTE — Transfer of Care (Signed)
Immediate Anesthesia Transfer of Care Note  Patient: Anthony [name].  Procedure(s) Performed: Procedure(s): OPEN REPAIR OF RECURENT LEFT INGUINAL HERNIA REPAIR WITH MESH (Left) LAPAROSCOPY DIAGNOSTIC (N/A)  Patient Location: PACU  Anesthesia Type:General  Level of Consciousness:  sedated, patient cooperative and responds to stimulation  Airway & Oxygen Therapy:Patient Spontanous Breathing and Patient connected to face mask oxgen  Post-op Assessment:  Report given to PACU RN and Post -op Vital signs reviewed and stable  Post vital signs:  Reviewed and stable  Last Vitals:  Filed Vitals:   07/26/14 1128  BP: 117/79  Pulse: 73  Temp: 36.4 C  Resp: 20    Complications: No apparent anesthesia complications

## 2014-07-26 NOTE — Progress Notes (Signed)
Patient expressed suicidal ideations. He doesn't have a plan but " I am trying to think of a way it is an accident and doesn't look like suicide". I passed the word to holding, left a note on the chart, left a voicemail on Financial controller; and spoke with [facility], Venia Carbon RN. Anderson Malta said she would follow up on it

## 2014-07-26 NOTE — Progress Notes (Signed)
Social Work Called per MD request regarding pt statements about suicide before surgery.

## 2014-07-26 NOTE — Progress Notes (Signed)
Pt arrived from PACU via bed accompanied by RN. Vitals stable. Orders reviewed. Sitter at bedside. Will continue to assess

## 2014-07-26 NOTE — Anesthesia Postprocedure Evaluation (Signed)
  Anesthesia Post-op Note  Patient: Anthony [name].  Procedure(s) Performed: Procedure(s) (LRB): OPEN REPAIR OF RECURENT LEFT INGUINAL HERNIA REPAIR WITH MESH (Left) LAPAROSCOPY DIAGNOSTIC (N/A)  Patient Location: PACU  Anesthesia Type: General  Level of Consciousness: awake and alert   Airway and Oxygen Therapy: Patient Spontanous Breathing  Post-op Pain: mild  Post-op Assessment: Post-op Vital signs reviewed, Patient's Cardiovascular Status Stable, Respiratory Function Stable, Patent Airway and No signs of Nausea or vomiting  Last Vitals:  Filed Vitals:   07/26/14 1929  BP: 114/59  Pulse: 69  Temp: 37.5 C  Resp: 18    Post-op Vital Signs: stable   Complications: No apparent anesthesia complications

## 2014-07-26 NOTE — Interval H&P Note (Signed)
History and Physical Interval Note:  07/26/2014 1:04 PM  [name]  has presented today for surgery, with the diagnosis of Recurrent Left Inguinal Hernia  The various methods of treatment have been discussed with the patient and family. After consideration of risks, benefits and other options for treatment, the patient has consented to  Procedure(s): OPEN REPAIR OF RECURENT LEFT INGUINAL HERNIA REPAIR WITH MESH (Left) LAPAROSCOPY DIAGNOSTIC (Left) as a surgical intervention .  The patient's history has been reviewed, patient examined, no change in status, stable for surgery.  I have reviewed the patient's chart and labs.  Questions were answered to the patient's satisfaction.    No changes with respect to [facility] The preop nurse learned pt has been having SI but no plans yet. I confirmed this with the pt. Just doesn't see the point anymore with all of his medical issues. Went to what sounds like group therapy when I asked him about seeing a therapist, but "didn't see any real improvement/change" after several weeks so stopped going. Advised pt he would have someone staying in his room this evening. We will get a psych consult postop.   We also rediscussed risk/benefits of a recurrent hernia repair - increased risks bleeding, injury, chronic pain, recurrence, hematoma/seroma. Pt cont to smoke.   Leighton Ruff. Redmond Pulling, MD, Lydia, Bariatric, & Minimally Invasive Surgery [facility] Surgery, [place]    [facility] M

## 2014-07-26 NOTE — H&P (View-Only) (Signed)
Robins 05/26/2014 10:47 AM Location: [facility] Patient #: [id number] DOB: [birth year] Divorced / Language: [name] / Race: White Male  History of Present Illness Anthony Hiss M. Lanetra Hartley MD; 05/26/2014 11:18 AM) Patient words: tick bite.  The patient is a 64 year old male who presents for an evaluation of a hernia. He was initially scheduled for open repair of recurrent left inguinal hernia earlier this week however his surgery was canceled when he alerted [place] that he he might have been bit by a tic. He states that he is felt weak and tired. He denies any fevers or chills. He has a follow-up appointment with his primary care physician next week. He also stated that he had developed a rash in his groin area. That was another reason we canceled the surgery. He states that the rash is gotten better. He also thought that there might be some tic bites on his scrotum as well because he found on HIS SCROTUM. He denies any dysuria. He is a longer smoking but he is using patches and Wellbutrin. He reports a good appetite. He denies any lightheadedness or dizziness   Problem List/Past Medical Gayland Curry, MD; 05/26/2014 11:20 AM) RECURRENT LEFT INGUINAL HERNIA (550.91  K40.91) BIPOLAR DEPRESSION (296.50  F31.30) GLUCOSE INTOLERANCE (IMPAIRED GLUCOSE TOLERANCE) (790.22  R73.02) HISTORY OF DVT (DEEP VEIN THROMBOSIS) (V12.51  Z86.718) JOCK ITCH (110.3  B35.6) TOBACCO DEPENDENCE (305.1  F17.200) NOCTURIA ASSOCIATED WITH BENIGN PROSTATIC HYPERTROPHY (600.01  N40.1) GROIN SWELLING (789.30  R19.09) S/P HERNIA SURGERY (V45.89  Z98.89) POSTOP CHECK (V67.00  Z09) CHRONIC BRONCHITIS, UNSPECIFIED CHRONIC BRONCHITIS TYPE (491.9  J42) CORONARY ATHEROSCLEROSIS DUE TO CALCIFIED CORONARY LESION (414.00  I25.10) BILATERAL RECURRENT INGUINAL HERNIA WITH OBSTRUCTION AND WITHOUT GANGRENE (492.01  E07.12) UMBILICAL HERNIA WITHOUT OBSTRUCTION AND WITHOUT GANGRENE (553.1   K42.9)  Other Problems Gayland Curry, MD; 05/26/2014 11:20 AM) Congestive Heart Failure Alcohol Abuse Anxiety Disorder Hypercholesterolemia Inguinal Hernia Other disease, cancer, significant illness Umbilical Hernia Repair  Past Surgical History Gayland Curry, MD; 05/26/2014 11:20 AM) Vasectomy  Diagnostic Studies History Gayland Curry, MD; 05/26/2014 11:20 AM) Colonoscopy never  Allergies Elbert Ewings, CMA; 05/26/2014 10:47 AM) Penicillins  Medication History Elbert Ewings, CMA; 05/26/2014 10:47 AM) Flomax (0.4MG  Capsule, 1 (one) Oral daily, Taken starting 05/13/2014) Active. (start taking 2 weeks before hernia surgery) Xarelto (20MG  Tablet, Oral daily) Active. Aspirin Childrens (81MG  Tablet Chewable, Oral daily) Active. Atarax (25MG  Tablet, Oral daily) Active. Mevacor (40MG  Tablet, Oral daily) Active. Lopressor (50MG  Tablet, .5 Oral daily) Active. TraZODone HCl (50MG  Tablet, Oral daily) Active. Valerian Root Plus (Oral daily) Active. Medications Reconciled  Social History Gayland Curry, MD; 05/26/2014 11:20 AM) Tobacco use Current every day smoker. Alcohol use Remotely quit alcohol use. Caffeine use Coffee. Illicit drug use Remotely quit drug use.  Family History Gayland Curry, MD; 05/26/2014 11:20 AM) Depression Father, Mother. Alcohol Abuse Father, Mother, Sister.  Vitals Elbert Ewings CMA; 05/26/2014 10:48 AM) 05/26/2014 10:47 AM Weight: 210 lb Height: 70in Body Surface Area: 2.17 m Body Mass Index: 30.13 kg/m Temp.: 98.67F(Oral)  Pulse: 80 (Regular)  Resp.: 17 (Unlabored)  BP: 130/72 (Sitting, Left Arm, Standard)    Physical Exam Anthony Hiss M. Shahara Hartsfield MD; 05/26/2014 11:16 AM) General Mental Status-Alert. General Appearance-Consistent with stated age. Hydration-Well hydrated. Voice-Normal.  Chest and Lung Exam Chest and lung exam reveals -quiet, even and easy respiratory effort with no use of accessory muscles and  on auscultation, normal breath sounds, no adventitious sounds and normal  vocal resonance. Inspection Chest Wall - Normal. Back - normal.  Cardiovascular Cardiovascular examination reveals -normal heart sounds, regular rate and rhythm with no murmurs, carotid auscultation reveals no bruits and normal pedal pulses bilaterally.  Abdomen Inspection Skin - Scar - Note: Incision: clean, dry, and intact. Palpation/Percussion Palpation and Percussion of the abdomen reveal - Soft, Non Tender, No Rebound tenderness, No Rigidity (guarding) and No hepatosplenomegaly. Auscultation Auscultation of the abdomen reveals - Bowel sounds normal. Note: Well-healed umbilical and abdominal wall trocar sites. The obese; no cellulitis, induration or fluctuance. Both testicles are down. Full left scrotum which was present preoperatively no sign of hernia recurrence; some skin rash on L inner thigh c/w jock itch. scrotum - normal - no cellulitis, induration, fluctuance     Assessment & Plan Anthony Hiss M. Janee Ureste MD; 05/26/2014 11:20 AM) RECURRENT LEFT INGUINAL HERNIA (550.91  K40.91) Impression: We will delay repair of his hernia until he is evaluated and cleared by his primary care physician as well as when he feels symptomatically better. Current Plans  Pt Education - [facility] Free Text Education/Instructions: discussed with patient and provided information. HISTORY OF DVT (DEEP VEIN THROMBOSIS) (V12.51  Z86.718) Impression: He is not sure why he is on blood thinners long-term. He reports one isolated DVT after traumatic fall. I encouraged him to discuss this with his primary care physician. We discussed indications for long-term blood thinner usage such as genetic mutations and/or multiple prior blood clots. To his knowledge he has only had 1 lower extremity DVT. Current Plans Pt Education - [facility] Free Text Education/Instructions: discussed with patient and provided information. TOBACCO DEPENDENCE (305.1   F17.200) Impression: congratulated pt on his efforts to stop smoking Current Plans Pt Education - [facility] Free Text Education/Instructions: discussed with patient and provided information. BIPOLAR DEPRESSION (296.50  F31.30) JOCK ITCH (110.3  B35.6) Impression: He has some mild jock itch. I informed him to get a cream over-the-counter for jock itch and a follow package directions. Current Plans Pt Education - CCS Free Text Education/Instructions: discussed with patient and provided informat  Leighton Ruff. Redmond Pulling, MD, FACS General, Bariatric, & Minimally Invasive Surgery [facility] Surgery, [place]

## 2014-07-27 ENCOUNTER — Encounter (HOSPITAL_COMMUNITY): Payer: Self-pay | Admitting: General Surgery

## 2014-07-27 DIAGNOSIS — J449 Chronic obstructive pulmonary disease, unspecified: Secondary | ICD-10-CM | POA: Diagnosis not present

## 2014-07-27 DIAGNOSIS — K4091 Unilateral inguinal hernia, without obstruction or gangrene, recurrent: Secondary | ICD-10-CM | POA: Diagnosis not present

## 2014-07-27 DIAGNOSIS — F17210 Nicotine dependence, cigarettes, uncomplicated: Secondary | ICD-10-CM | POA: Diagnosis not present

## 2014-07-27 DIAGNOSIS — I2510 Atherosclerotic heart disease of native coronary artery without angina pectoris: Secondary | ICD-10-CM | POA: Diagnosis not present

## 2014-07-27 DIAGNOSIS — B356 Tinea cruris: Secondary | ICD-10-CM | POA: Diagnosis not present

## 2014-07-27 DIAGNOSIS — F329 Major depressive disorder, single episode, unspecified: Secondary | ICD-10-CM | POA: Diagnosis not present

## 2014-07-27 DIAGNOSIS — F339 Major depressive disorder, recurrent, unspecified: Secondary | ICD-10-CM

## 2014-07-27 LAB — CBC
HCT: 45.2 % (ref 39.0–52.0)
Hemoglobin: 15.4 g/dL (ref 13.0–17.0)
MCH: 31 pg (ref 26.0–34.0)
MCHC: 34.1 g/dL (ref 30.0–36.0)
MCV: 90.9 fL (ref 78.0–100.0)
PLATELETS: 206 10*3/uL (ref 150–400)
RBC: 4.97 MIL/uL (ref 4.22–5.81)
RDW: 12.2 % (ref 11.5–15.5)
WBC: 12.4 10*3/uL — ABNORMAL HIGH (ref 4.0–10.5)

## 2014-07-27 LAB — BASIC METABOLIC PANEL
ANION GAP: 7 (ref 5–15)
BUN: 9 mg/dL (ref 6–20)
CHLORIDE: 106 mmol/L (ref 101–111)
CO2: 26 mmol/L (ref 22–32)
CREATININE: 0.92 mg/dL (ref 0.61–1.24)
Calcium: 8.9 mg/dL (ref 8.9–10.3)
GFR calc Af Amer: >60 mL/min (ref >60)
GFR calc non Af Amer: >60 mL/min (ref >60)
GLUCOSE: 119 mg/dL — AB (ref 65–99)
POTASSIUM: 4 mmol/L (ref 3.5–5.1)
SODIUM: 139 mmol/L (ref 135–145)

## 2014-07-27 MED ORDER — OXYCODONE HCL 5 MG PO TABS: 5.0000 mg | ORAL_TABLET | ORAL | Status: DC | PRN

## 2014-07-27 MED ORDER — PANTOPRAZOLE SODIUM 40 MG PO TBEC: 40.0000 mg | DELAYED_RELEASE_TABLET | Freq: Every day | ORAL | Status: DC

## 2014-07-27 NOTE — Discharge Summary (Signed)
Physician Discharge Summary  Anthony [name]. TKZ:[id number] DOB: October 26, 1950 DOA: 07/26/2014  PCP: Wardell Honour, MD  Admit date: 07/26/2014 Discharge date: 07/27/2014  Recommendations for Outpatient Follow-up:   Follow-up Information    Follow up with Gayland Curry, MD On 08/26/2014.   Specialty:  General Surgery   Why:  2:00 PM (pls arrive at 1:45 PM) , For wound re-check   Contact information:   [address] [place] [address] [phone]      Discharge Diagnoses:  1. Recurrent left indirect/direct inguinal hernia 2. Tobacco use 3. Major psychotic depression, recurrent 4. CAD 5. COPD  Surgical Procedure: diagnostic laparoscopy, open repair of recurrent left indirect/direct inguinal hernia with mesh  Discharge Condition: stable Disposition: home  Diet recommendation: regular/cardiac  Filed Weights   07/26/14 1128  Weight: 92.443 kg (203 lb 12.8 oz)    Hospital Course:  Preoperatively in the holding area the patient expressed suicidal ideations but did not express a plan. I confirmed this with the patient. He states that he had several medical issues going on and just didn't really see the point. He did not have a plan. Postoperatively a sitter was at his bedside and he was placed on suicide precautions. A social worker saw him and I called in a psychiatric consult. He was Postoperatively because one he he lived alone as well as because of the above. But the social worker and psychiatrist cleared him for discharge. Please see their notes for further details. With respect to his hernia repair he was doing quite well. There is no urinary retention. He tolerated a diet. He was ambulating without difficulty. His vital signs are stable. And his wound was stable. I discussed discharge instructions with the patient.   Discharge Instructions  Discharge Instructions    Diet - low sodium heart healthy    Complete by:  As directed      Discharge instructions     Complete by:  As directed   See CCS discharge instructions     Increase activity slowly    Complete by:  As directed             Medication List    TAKE these medications        acetaminophen 325 MG tablet  Commonly known as:  TYLENOL  Take 650 mg by mouth every 6 (six) hours as needed.     aspirin EC 81 MG tablet  Take 81 mg by mouth 2 (two) times daily.     FISH OIL PO  Take by mouth.     glucosamine-chondroitin 500-400 MG tablet  Take 1 tablet by mouth 3 (three) times daily.     LUTEIN PO  Take 1-2 tablets by mouth every morning.     oxyCODONE 5 MG immediate release tablet  Commonly known as:  Oxy IR/ROXICODONE  Take 1-2 tablets (5-10 mg total) by mouth every 4 (four) hours as needed for moderate pain.     psyllium 58.6 % packet  Commonly known as:  METAMUCIL  Take 1 packet by mouth as needed.     senna 8.6 MG Tabs tablet  Commonly known as:  SENOKOT  Take 1 tablet by mouth 4 (four) times daily as needed for mild constipation.     traMADol 50 MG tablet  Commonly known as:  ULTRAM  Take 2 tablets (100 mg total) by mouth every 12 (twelve) hours as needed.     Vitamin D (Cholecalciferol) 1000 UNITS Caps  Take 1 capsule by mouth.           Follow-up Information    Follow up with Gayland Curry, MD On 08/26/2014.   Specialty:  General Surgery   Why:  2:00 PM (pls arrive at 1:45 PM) , For wound re-check   Contact information:   [place] [address] [phone]        The results of significant diagnostics from this hospitalization (including imaging, microbiology, ancillary and laboratory) are listed below for reference.    Significant Diagnostic Studies: No results found.  Microbiology: No results found for this or any previous visit (from the past 240 hour(s)).   Labs: Basic Metabolic Panel:  Recent Labs Lab 07/27/14 0450  NA 139  K 4.0  CL 106  CO2 26  GLUCOSE 119*  BUN 9  CREATININE 0.92  CALCIUM 8.9    Liver Function Tests: No results for input(s): AST, ALT, ALKPHOS, BILITOT, PROT, ALBUMIN in the last 168 hours. No results for input(s): LIPASE, AMYLASE in the last 168 hours. No results for input(s): AMMONIA in the last 168 hours. CBC:  Recent Labs Lab 07/26/14 1215 07/27/14 0450  WBC 9.7 12.4*  NEUTROABS 7.1  --   HGB 17.1* 15.4  HCT 49.3 45.2  MCV 89.3 90.9  PLT 235 206   Cardiac Enzymes: No results for input(s): CKTOTAL, CKMB, CKMBINDEX, TROPONINI in the last 168 hours. BNP: BNP (last 3 results) No results for input(s): BNP in the last 8760 hours.  ProBNP (last 3 results) No results for input(s): PROBNP in the last 8760 hours.  CBG: No results for input(s): GLUCAP in the last 168 hours.  Principal Problem:   Major psychotic depression, recurrent Active Problems:   Recurrent left inguinal hernia   Time coordinating discharge: 20 minutes  Signed:  Gayland Curry, MD [facility] Surgery, [place] [phone] 07/27/2014, 5:54 PM

## 2014-07-27 NOTE — Progress Notes (Signed)
1 Day Post-Op  Subjective: Some soreness. Pain ok. No n/v. Tolerated clears. Voided on own  Objective: Vital signs in last 24 hours: Temp:  [97.7 F (36.5 C)-99.5 F (37.5 C)] 98.7 F (37.1 C) (07/19 1145) Pulse Rate:  [63-74] 70 (07/19 1145) Resp:  [16-21] 18 (07/19 1145) BP: (99-144)/(58-84) 119/71 mmHg (07/19 1145) SpO2:  [92 %-98 %] 97 % (07/19 1145)    Intake/Output from previous day: 07/18 0701 - 07/19 0700 In: 1855.8 [P.O.:500; I.V.:1355.8] Out: 550 [Urine:550] Intake/Output this shift: Total I/O In: 646.2 [P.O.:480; I.V.:166.2] Out: 500 [Urine:500]  Alert, appropriate, nad Cta ant Obese, soft, dressings c/d/i; no hematoma. No significant inguinal swelling  No edema  Lab Results:   Recent Labs  07/26/14 1215 07/27/14 0450  WBC 9.7 12.4*  HGB 17.1* 15.4  HCT 49.3 45.2  PLT 235 206   BMET  Recent Labs  07/27/14 0450  NA 139  K 4.0  CL 106  CO2 26  GLUCOSE 119*  BUN 9  CREATININE 0.92  CALCIUM 8.9   PT/INR No results for input(s): LABPROT, INR in the last 72 hours. ABG No results for input(s): PHART, HCO3 in the last 72 hours.  Invalid input(s): PCO2, PO2  Studies/Results: No results found.  Anti-infectives: Anti-infectives    Start     Dose/Rate Route Frequency Ordered Stop   07/26/14 1131  vancomycin (VANCOCIN) IVPB 1000 mg/200 mL premix     1,000 mg 200 mL/hr over 60 Minutes Intravenous On call to O.R. 07/26/14 1131 07/26/14 1320      Assessment/Plan: s/p Procedure(s): OPEN REPAIR OF RECURENT LEFT INGUINAL HERNIA REPAIR WITH MESH (Left) LAPAROSCOPY DIAGNOSTIC (N/A)  Adv diet as tolerated Chemical vte prophylaxis Suicide precautions Discharge when cleared by psychiatry Discussed dc instructions with pt  Leighton Ruff. Redmond Pulling, MD, FACS General, Bariatric, & Minimally Invasive Surgery [facility] Surgery, [place]      [facility] Jerilynn Mages 07/27/2014

## 2014-07-27 NOTE — Discharge Instructions (Signed)
[facility]  UMBILICAL OR INGUINAL HERNIA REPAIR: POST OP INSTRUCTIONS  Always review your discharge instruction sheet given to you by the facility where your surgery was performed. IF YOU HAVE DISABILITY OR FAMILY LEAVE FORMS, YOU MUST BRING THEM TO THE OFFICE FOR PROCESSING.   DO NOT GIVE THEM TO YOUR DOCTOR.  1. A  prescription for pain medication may be given to you upon discharge.  Take your pain medication as prescribed, if needed.  If narcotic pain medicine is not needed, then you may take acetaminophen (Tylenol) or ibuprofen (Advil) as needed. 2. Take your usually prescribed medications unless otherwise directed. 3. If you need a refill on your pain medication, please contact your pharmacy.  They will contact our office to request authorization. Prescriptions will not be filled after 5 pm or on week-ends. 4. You should follow a light diet the first 24 hours after arrival home, such as soup and crackers, etc.  Be sure to include lots of fluids daily.  Resume your normal diet the day after surgery. 5. Most patients will experience some swelling and bruising around the umbilicus or in the groin and scrotum.  Ice packs and reclining will help.  Swelling and bruising can take several days to resolve.  6. It is common to experience some constipation if taking pain medication after surgery.  Increasing fluid intake and taking a stool softener (such as Colace) will usually help or prevent this problem from occurring.  A mild laxative (Milk of Magnesia or Miralax) should be taken according to package directions if there are no bowel movements after 48 hours. 7. Unless discharge instructions indicate otherwise, you may remove your bandages 48 hours after surgery, and you may shower at that time.  You may have steri-strips (small skin tapes) in place directly over the incision.  These strips should be left on the skin for 7-10 days.  8. ACTIVITIES:  You may resume regular (light)  daily activities beginning the next day--such as daily self-care, walking, climbing stairs--gradually increasing activities as tolerated.  You may have sexual intercourse when it is comfortable.  Refrain from any heavy lifting or straining until approved by your doctor. a. You may drive when you are no longer taking prescription pain medication, you can comfortably wear a seatbelt, and you can safely maneuver your car and apply brakes. b. RETURN TO WORK: n/a 9. You should see your doctor in the office for a follow-up appointment approximately 2-3 weeks after your surgery.  Make sure that you call for this appointment within a day or two after you arrive home to insure a convenient appointment time. 10. OTHER INSTRUCTIONS: DO NOT LIFT, PUSH, OR PULL ANYTHING GREATER THAN 10 POUNDS FOR 6 WEEKS 11. REMOVE OUTER CLEAR BANDAGE AND GAUZE ON WEDNESDAY    WHEN TO CALL YOUR DOCTOR: 1. Fever over 101.0 2. Inability to urinate 3. Nausea and/or vomiting 4. Extreme swelling or bruising 5. Continued bleeding from incision. 6. Increased pain, redness, or drainage from the incision  The clinic staff is available to answer your questions during regular business hours.  Please dont hesitate to call and ask to speak to one of the nurses for clinical concerns.  If you have a medical emergency, go to the nearest emergency room or call 911.  A surgeon from [facility] is always on call at the hospital   [address] [place], [place], [place]  [address] ?  [address] [address], [place], [place]  [address] [phone] ? [phone] ? FAX [phone] Web site: [email or web address]

## 2014-07-27 NOTE — Clinical Social Work Psych Assess (Signed)
Clinical Social Work Nature conservation officer  Clinical Social Worker:  Boone Master, [place] Date/Time:  07/27/2014, 3:56 PM Referred By:  Physician Date Referred:  07/27/14 Reason for Referral:  Behavioral Health Issues   Presenting Symptoms/Problems  Presenting Symptoms/Problems(in person's/family's own words):  Psych consulted due to SI.   Abuse/Neglect/Trauma History  Abuse/Neglect/Trauma History:  Denies History Abuse/Neglect/Trauma History Comments (indicate dates):  N/A   Psychiatric History  Psychiatric History:  Inpatient/Hospitalization, Outpatient Treatment Psychiatric Medication:  None currently   Current Mental Health Hospitalizations/Previous Mental Health History:  Patient reports he has been diagnosed with depression in the past and received medication management but is not currently receiving any treatment.   Current Provider:  None currently Place and Date:  N/A  Current Medications:   Scheduled Meds: . docusate sodium  100 mg Oral BID  . heparin  5,000 Units Subcutaneous 3 times per day  . pantoprazole  40 mg Oral QHS   Continuous Infusions: . dextrose 5 % and 0.45 % NaCl with KCl 20 mEq/L 10 mL/hr at 07/27/14 0828   PRN Meds:.morphine injection, ondansetron **OR** ondansetron (ZOFRAN) IV, oxyCODONE, phenol     Previous Inpatient Admission/Date/Reason:  Patient has been to [facility].   Emotional Health/Current Symptoms  Suicide/Self Harm: Suicidal Ideation (ex. "I can't take anymore, I wish I could disappear") Suicide Attempt in Past (date/description):  Patient reports he has passive SI but none currently. Patient is able to contract for safety.  Other Harmful Behavior (ex. homicidal ideation) (describe):  None reported   Psychotic/Dissociative Symptoms  Psychotic/Dissociative Symptoms: None Reported Other Psychotic/Dissociative Symptoms:  N/A   Attention/Behavioral Symptoms  Attention/Behavioral Symptoms: Within Normal  Limits Other Attention/Behavioral Symptoms:  Patient engaged during assessment.   Cognitive Impairment  Cognitive Impairment:  Within Normal Limits Other Cognitive Impairment:  Patient alert and oriented.   Mood and Adjustment  Mood and Adjustment:  Flat   Stress, Anxiety, Trauma, Any Recent Loss/Stressor  Stress, Anxiety, Trauma, Any Recent Loss/Stressor: None Reported Anxiety (frequency):  N/A  Phobia (specify):  N/A  Compulsive Behavior (specify):  N/A  Obsessive Behavior (specify):  N/A  Other Stress, Anxiety, Trauma, Any Recent Loss/Stressor:  N/A   Substance Abuse/Use  Substance Abuse/Use: None SBIRT Completed (please refer for detailed history): No Self-reported Substance Use (last use and frequency):  None reported  Urinary Drug Screen Completed: No Alcohol Level:  N/A   Environment/Housing/Living Arrangement  Environmental/Housing/Living Arrangement: Stable Housing Who is in the Home:  Alone  Emergency Contact:  dtrs   Financial  Financial: Medicare   Patient's Strengths and Goals  Patient's Strengths and Goals (patient's own words):  Patient reports he is motivated to recover quickly so he can start exercising again.   Clinical Social Worker's Interpretive Summary  Clinical Social Workers Interpretive Summary:    CSW received referral to complete psychosocial assessment. CSW and psych MD evaluated patient together. Patient laying in bed in room and agreeable to assessment.  Patient reports he lives home alone and has two dtrs. Dtrs live nearby but do not visit patient often. Patient reports a long history of depression and feels that depression has increased due to medical problems. Patient reports that he has been to [facility] in the past and was prescribed medication. Patient initially stated that medication was helpful but then later stated it was not and that is why he stopped taking medication. Patient has been to [facility] in the past but has stopped  all treatment because he did not think it was helpful.  Patient reports passive SI and feels it is due to depression. Patient denies any current SI and contracts for safety. Patient reports that he is not interested in medication or treatment and feels his depression will improve over time and once his feeling better physically.  CSW will continue to follow to offer support during hospitalization.   Disposition  Disposition: Outpatient Referral Made/Needed         Sindy Messing, LCSW [phone]

## 2014-07-27 NOTE — Consult Note (Signed)
Anthony [name]   Reason for [name]:  Depression and anxiety Referring Physician:  Dr. Redmond Pulling Patient Identification: Anthony [name]. MRN:  [id number] Principal Diagnosis: Major psychotic depression, recurrent Diagnosis:   Patient Active Problem List   Diagnosis Date Noted  . Recurrent left inguinal hernia [K40.91] 07/26/2014  . Chronic right hip pain [M25.551, G89.29] 06/23/2014  . S/P bilateral inguinal hernia repair [Z98.89] 10/30/2013  . Nodule of left lung [R91.1] 05/29/2013  . Bilateral inguinal hernia [K40.20] 05/28/2013  . Umbilical hernia [R51.8] 05/28/2013  . Major psychotic depression, recurrent [F33.3] 12/16/2012  . Inadequate anticoagulation [Z51.81, Z79.01] 04/29/2012  . Hypocalcemia [E83.51] 07/28/2010  . CAD (coronary artery disease) [I25.10]   . HLD (hyperlipidemia) [E78.5]   . COPD (chronic obstructive pulmonary disease) [J44.9]   . Tobacco abuse [Z72.0]   . Depression [F32.9]     Total Time spent with patient: 1 hour  Subjective:   Anthony Memoli. is a 64 y.o. male patient admitted with Inguinal hernia and tick bite. Marland Kitchen  HPI:  Anthony [name] is a 64 years old Caucasian male seen, chart reviewed for psychiatric consultation and evaluation of increased symptoms of depression, anxiety secondary to multiple medical problems and limited clinical response. Patient reportedly suffering with bone spur on his tailbone which causing hip pain and later was diagnosed with inguinal hernia which required repair. Patient reportedly suffering with the depression and anxiety and has been [facility] later [facility] [place]. Patient is currently not receiving outpatient medication management or counseling services at this time over one year. Patient lives by himself and denies current suicidal/homicidal ideation, intention or plans. Patient endorses some symptoms  of depression but not interested in medication management because of his previous experience that medication does not work. Patient is also not interested in outpatient counseling because he feels waste of time. Patient reported he has no current suicidal/homicidal ideation and contracts for safety during the hospitalization. Patient initially reported Wellbutrin was helpful in the past and also help him not to smoke tobacco. Later he stated he was misspoken about effectiveness of medication. Patient refused to discuss about other medication management including Cymbalta. He reports a good appetite but average sleep. He denies any lightheadedness or dizziness.   HPI Elements:   Location:  Depression. Quality:  Fair to poor. Severity:  Isolated, loss of interest poor energy. Timing:  Hospitalization for surgery. Duration:  Few days. Context:  Psychosocial stresses and chronic medical conditions and chronic pain.  Past Medical History:  Past Medical History  Diagnosis Date  . CAD (coronary artery disease)     a. INF STEMI 07/04/10:  tx with thrombectomy + [facility] to [facility];  cath 07/04/10: dLM 10-20%, pLAD 40-50%, mLAD 20-30%, pRCA 30%, mRCA occluded and tx with PCI, EF 50% with inf HK. A Multilink  . HLD (hyperlipidemia)   . COPD (chronic obstructive pulmonary disease)   . Tobacco abuse   . Bipolar disorder   . History of DVT (deep vein thrombosis)     traumatic, s/p coumadin tx.  . Glucose intolerance (impaired glucose tolerance)     A1c 6.2 06/2010  . Depression   . Myocardial infarction   . Shortness of breath     walking distance or climbing stairs  . Anxiety   . Hypertension     pt states is currently on no medications  . Urinary frequency   . Arthritis  Past Surgical History  Procedure Laterality Date  . Electrocardiogram      Showed inferolateral ST-elevation and a code STEMI was activated. In the ER, he was treated with morphine, herarin, and 600 mg of Plavix. He was  transferred emergently to [facility].   . Stent placement    . Vasectomy    . Laparoscopic inguinal hernia with umbilical hernia Bilateral 10/30/2013    Procedure: laparoscopic repair left pantaloom hernia with mesh, laparoscopic right inguinal hernia with mesh, OPEN REPAIR OF UMBILICAL HERNIA ;  Surgeon: Gayland Curry, MD;  Location: [facility];  Service: General;  Laterality: Bilateral;  . Insertion of mesh Bilateral 10/30/2013    Procedure: INSERTION OF MESH;  Surgeon: Gayland Curry, MD;  Location: [facility];  Service: General;  Laterality: Bilateral;  . Cardiac catheterization     Family History:  Family History  Problem Relation Age of Onset  . Coronary artery disease Father    Social History:  History  Alcohol Use No    Comment: past hx of ETOH use was in inpt facility per court order  - 20 years     History  Drug Use No    Comment: snorted heroin and cocaine    History   Social History  . Marital Status: Divorced    Spouse Name: N/A  . Number of Children: N/A  . Years of Education: N/A   Social History Main Topics  . Smoking status: Current Every Day Smoker -- 2.00 packs/day for 45 years    Types: Cigarettes  . Smokeless tobacco: Never Used  . Alcohol Use: No     Comment: past hx of ETOH use was in inpt facility per court order  - 20 years  . Drug Use: No     Comment: snorted heroin and cocaine  . Sexual Activity: Not on file   Other Topics Concern  . None   Social History Narrative   Lives in [place]. He lives alone. He has kids but is not married.    Additional Social History:                          Allergies:   Allergies  Allergen Reactions  . Flomax [Tamsulosin Hcl]     This medication makes patient feel sick  . Penicillins Other (See Comments)    unknown reaction    Labs:  Results for orders placed or performed during the hospital encounter of 07/26/14 (from the past 48 hour(s))  CBC WITH DIFFERENTIAL     Status: Abnormal    Collection Time: 07/26/14 12:15 PM  Result Value Ref Range   WBC 9.7 4.0 - 10.5 K/uL   RBC 5.52 4.22 - 5.81 MIL/uL   Hemoglobin 17.1 (H) 13.0 - 17.0 g/dL   HCT 49.3 39.0 - 52.0 %   MCV 89.3 78.0 - 100.0 fL   MCH 31.0 26.0 - 34.0 pg   MCHC 34.7 30.0 - 36.0 g/dL   RDW 12.0 11.5 - 15.5 %   Platelets 235 150 - 400 K/uL   Neutrophils Relative % 74 43 - 77 %   Neutro Abs 7.1 1.7 - 7.7 K/uL   Lymphocytes Relative 19 12 - 46 %   Lymphs Abs 1.8 0.7 - 4.0 K/uL   Monocytes Relative 7 3 - 12 %   Monocytes Absolute 0.7 0.1 - 1.0 K/uL   Eosinophils Relative 0 0 - 5 %   Eosinophils Absolute 0.0  0.0 - 0.7 K/uL   Basophils Relative 0 0 - 1 %   Basophils Absolute 0.0 0.0 - 0.1 K/uL  CBC     Status: Abnormal   Collection Time: 07/27/14  4:50 AM  Result Value Ref Range   WBC 12.4 (H) 4.0 - 10.5 K/uL   RBC 4.97 4.22 - 5.81 MIL/uL   Hemoglobin 15.4 13.0 - 17.0 g/dL   HCT 45.2 39.0 - 52.0 %   MCV 90.9 78.0 - 100.0 fL   MCH 31.0 26.0 - 34.0 pg   MCHC 34.1 30.0 - 36.0 g/dL   RDW 12.2 11.5 - 15.5 %   Platelets 206 150 - 400 K/uL  Basic metabolic panel     Status: Abnormal   Collection Time: 07/27/14  4:50 AM  Result Value Ref Range   Sodium 139 135 - 145 mmol/L   Potassium 4.0 3.5 - 5.1 mmol/L   Chloride 106 101 - 111 mmol/L   CO2 26 22 - 32 mmol/L   Glucose, Bld 119 (H) 65 - 99 mg/dL   BUN 9 6 - 20 mg/dL   Creatinine, Ser 0.92 0.61 - 1.24 mg/dL   Calcium 8.9 8.9 - 10.3 mg/dL   GFR calc non Af Amer >60 >60 mL/min   GFR calc Af Amer >60 >60 mL/min    Comment: (NOTE) The eGFR has been calculated using the CKD EPI equation. This calculation has not been validated in all clinical situations. eGFR's persistently <60 mL/min signify possible Chronic Kidney Disease.    Anion gap 7 5 - 15    Vitals: Blood pressure 113/68, pulse 64, temperature 98.4 F (36.9 C), temperature source Oral, resp. rate 20, height 5' 10"  (1.778 m), weight 92.443 kg (203 lb 12.8 oz), SpO2 96 %.  Risk to Self: Is  patient at risk for suicide?: Yes Risk to Others:   Prior Inpatient Therapy:   Prior Outpatient Therapy:    Current Facility-Administered Medications  Medication Dose Route Frequency Provider Last Rate Last Dose  . acetaminophen (TYLENOL) tablet 1,000 mg  1,000 mg Oral 4 times per day Greer Pickerel, MD   1,000 mg at 07/27/14 0509  . dextrose 5 % and 0.45 % NaCl with KCl 20 mEq/L infusion   Intravenous Continuous Greer Pickerel, MD 10 mL/hr at 07/27/14 [phone]    . docusate sodium (COLACE) capsule 100 mg  100 mg Oral BID Greer Pickerel, MD   100 mg at 07/27/14 1031  . heparin injection 5,000 Units  5,000 Units Subcutaneous 3 times per day Greer Pickerel, MD   5,000 Units at 07/27/14 0509  . morphine 2 MG/ML injection 1-4 mg  1-4 mg Intravenous Q1H PRN Greer Pickerel, MD   2 mg at 07/27/14 0010  . ondansetron (ZOFRAN) tablet 4 mg  4 mg Oral Q6H PRN Greer Pickerel, MD       Or  . ondansetron [facility]) injection 4 mg  4 mg Intravenous Q6H PRN Greer Pickerel, MD      . oxyCODONE (Oxy IR/ROXICODONE) immediate release tablet 5-10 mg  5-10 mg Oral Q4H PRN Greer Pickerel, MD   10 mg at 07/27/14 1031  . pantoprazole (PROTONIX) injection 40 mg  40 mg Intravenous Q24H Greer Pickerel, MD   40 mg at 07/26/14 2108  . phenol (CHLORASEPTIC) mouth spray 1 spray  1 spray Mouth/Throat PRN Greer Pickerel, MD        Musculoskeletal: Strength & Muscle Tone: decreased Gait & Station: unable to stand Patient leans: N/A  Psychiatric Specialty  Exam: Physical Exam as per history and physical   ROS depression anxiety, loss of interest, poor energy, isolated and chronic hip pain and back pain but denies nausea or vomiting, dizziness, shortness of breath and chest pain No Fever-chills, No Headache, No changes with Vision or hearing, reports vertigo No problems swallowing food or Liquids, No Chest pain, Cough or Shortness of Breath, No Abdominal pain, No Nausea or Vommitting, Bowel movements are regular, No Blood in stool or Urine, No  dysuria, No new skin rashes or bruises, No new joints pains-aches,  No new weakness, tingling, numbness in any extremity, No recent weight gain or loss, No polyuria, polydypsia or polyphagia,   A full 10 point Review of Systems was done, except as stated above, all other Review of Systems were negative.   Blood pressure 113/68, pulse 64, temperature 98.4 F (36.9 C), temperature source Oral, resp. rate 20, height 5' 10"  (1.778 m), weight 92.443 kg (203 lb 12.8 oz), SpO2 96 %.Body mass index is 29.24 kg/(m^2).  General Appearance: Guarded  Eye Contact::  Good  Speech:  Clear and Coherent  Volume:  Normal  Mood:  Depressed  Affect:  Constricted and Depressed  Thought Process:  Coherent and Goal Directed  Orientation:  Full (Time, Place, and Person)  Thought Content:  Rumination  Suicidal Thoughts:  No  Homicidal Thoughts:  No  Memory:  Immediate;   Good Recent;   Good  Judgement:  Fair  Insight:  Fair  Psychomotor Activity:  Decreased  Concentration:  Good  Recall:  Good  Fund of Knowledge:Good  Language: Good  Akathisia:  Negative  Handed:  Right  AIMS (if indicated):     Assets:  Communication Skills Desire for Improvement Financial Resources/Insurance Housing Leisure Time Resilience Social Support  ADL's:  Impaired  Cognition: WNL  Sleep:      Medical Decision Making: Review of Psycho-Social Stressors (1), Review or order clinical lab tests (1), Established Problem, Worsening (2), Review of Last Therapy Session (1), Review or order medicine tests (1), Review of Medication Regimen & Side Effects (2) and Review of New Medication or Change in Dosage (2)  Treatment Plan Summary: Patient presented with chronic symptoms of depression, isolation generalized weakness, lack of interest and poor sleep. Patient also reported chronic pain syndrome secondary to medical condition. Patient has no evidence of psychosis. Patient has no suicidal/homicidal ideation, intention or plan.  Patient contract for safety. Daily contact with patient to assess and evaluate symptoms and progress in treatment and Medication management  Plan: Safety concerns: Patient has no safety concerns and discontinue safety sitter Depression: Provided supportive therapy and recommended medication management which was refused by the patient. If patient changes his mind he might benefit from Cymbalta 30 mg daily or Wellbutrin XL 150 mg daily Patient does not meet criteria for psychiatric inpatient admission. Supportive therapy provided about ongoing stressors.  Appreciate psychiatric consultation Please contact [phone] or [phone] if needs further assistance  Disposition: Patient will be referred to the outpatient medication management when medically stable.  Anthony [name],JANARDHAHA R. 07/27/2014 10:58 AM

## 2014-07-27 NOTE — Progress Notes (Signed)
Verbal ordered for MD Redmond Pulling that patient may advance to regular diet and to change IV fluid rate to Drake Leach RN 8:50 AM 07-27-2014

## 2014-07-27 NOTE — Progress Notes (Signed)
Key Points: Use following P&T approved IV to PO non-antibiotic change policy.  Description contains the criteria that are approved Note: Policy Excludes:  Esophagectomy patientsPHARMACIST - PHYSICIAN COMMUNICATION DR:   Redmond Pulling CONCERNING: IV to Oral Route Change Policy  RECOMMENDATION: This patient is receiving protonix by the intravenous route.  Based on criteria approved by the Pharmacy and Therapeutics Committee, the intravenous medication(s) is/are being converted to the equivalent oral dose form(s).   DESCRIPTION: These criteria include:  The patient is eating (either orally or via tube) and/or has been taking other orally administered medications for a least 24 hours  The patient has no evidence of active gastrointestinal bleeding or impaired GI absorption (gastrectomy, short bowel, patient on TNA or NPO).  If you have questions about this conversion, please contact the Pharmacy Department  []   [phone] )  [facility] []   [phone] )  [facility]  []   [phone] )  [facility] [x]   [phone] )  [place], [place] 07/27/2014 1:00 PM

## 2014-08-12 ENCOUNTER — Other Ambulatory Visit: Payer: Self-pay | Admitting: Family

## 2014-08-13 NOTE — Telephone Encounter (Signed)
Last seen 07/14/14 Dr Sabra Heck  If approved print

## 2014-09-14 ENCOUNTER — Other Ambulatory Visit: Payer: Self-pay | Admitting: Family Medicine

## 2014-09-14 NOTE — Telephone Encounter (Signed)
Last filled 08/13/14, last seen 07/14/14.

## 2014-09-16 NOTE — Telephone Encounter (Signed)
Patient aware script can be picked up at front desk  

## 2014-09-20 DIAGNOSIS — M25551 Pain in right hip: Secondary | ICD-10-CM | POA: Diagnosis not present

## 2014-10-18 ENCOUNTER — Other Ambulatory Visit: Payer: Self-pay | Admitting: Family Medicine

## 2014-10-19 NOTE — Telephone Encounter (Signed)
Last filled 09/16/14, last seen 07/14/14. Rx will print

## 2014-10-20 NOTE — Telephone Encounter (Signed)
Left detailed message that rx is ready for pick up

## 2014-10-29 ENCOUNTER — Encounter: Payer: Self-pay | Admitting: Family Medicine

## 2014-10-29 ENCOUNTER — Ambulatory Visit (INDEPENDENT_AMBULATORY_CARE_PROVIDER_SITE_OTHER): Payer: Medicare Other | Admitting: Family Medicine

## 2014-10-29 VITALS — BP 117/77 | HR 68 | Temp 98.0°F | Ht 68.25 in | Wt 197.8 lb

## 2014-10-29 DIAGNOSIS — J441 Chronic obstructive pulmonary disease with (acute) exacerbation: Secondary | ICD-10-CM

## 2014-10-29 DIAGNOSIS — I2510 Atherosclerotic heart disease of native coronary artery without angina pectoris: Secondary | ICD-10-CM

## 2014-10-29 MED ORDER — LEVOFLOXACIN 500 MG PO TABS: 500.0000 mg | ORAL_TABLET | Freq: Every day | ORAL | Status: DC

## 2014-10-29 MED ORDER — BENZONATATE 200 MG PO CAPS: 200.0000 mg | ORAL_CAPSULE | Freq: Three times a day (TID) | ORAL | Status: DC | PRN

## 2014-10-29 NOTE — Progress Notes (Signed)
Subjective:  Patient ID: Anthony Form., male    DOB: 1950/09/08  Age: 64 y.o. MRN: [id number]  CC: URI   HPI Anthony [name]. presents for increasing cough over the last week. He quit smoking 3 days ago and hopes that that would help with the cough but it has not. He is marginally short of breath but not significantly. His energy is poor and he is been having to lay around more than usual. He is bringing up green sputum. He has had subjective fever with chills. He is sleeping okay at night.  History Anthony [name] has a past medical history of CAD (coronary artery disease); HLD (hyperlipidemia); COPD (chronic obstructive pulmonary disease) (Anthony [name]); Tobacco abuse; Bipolar disorder (Anthony [name]); History of DVT (deep vein thrombosis); Glucose intolerance (impaired glucose tolerance); Depression; Myocardial infarction (Anthony [name]); Shortness of breath; Anxiety; Hypertension; Urinary frequency; and Arthritis.   He has past surgical history that includes electrocardiogram; stent placement; Vasectomy; Laparoscopic inguinal hernia with umbilical hernia (Bilateral, 10/30/2013); Insertion of mesh (Bilateral, 10/30/2013); Cardiac catheterization; Inguinal hernia repair (Left, 07/26/2014); and laparoscopy (N/A, 07/26/2014).   His family history includes Coronary artery disease in his father.He reports that he has been smoking Cigarettes.  He has a 90 pack-year smoking history. He has never used smokeless tobacco. He reports that he does not drink alcohol or use illicit drugs.  Outpatient Prescriptions Prior to Visit  Medication Sig Dispense Refill  . acetaminophen (TYLENOL) 325 MG tablet Take 650 mg by mouth every 6 (six) hours as needed.    Marland Kitchen aspirin EC 81 MG tablet Take 81 mg by mouth 2 (two) times daily.     Marland Kitchen glucosamine-chondroitin 500-400 MG tablet Take 1 tablet by mouth 3 (three) times daily.    . LUTEIN PO Take 1-2 tablets by mouth every morning.    . Omega-3 Fatty Acids (FISH OIL PO) Take by mouth.    . senna  (SENOKOT) 8.6 MG TABS tablet Take 1 tablet by mouth 4 (four) times daily as needed for mild constipation.    . traMADol (ULTRAM) 50 MG tablet TAKE TWO TABLETS BY MOUTH EVERY 12 HOURS AS NEEDED 120 tablet 0  . Vitamin D, Cholecalciferol, 1000 UNITS CAPS Take 1 capsule by mouth.    . psyllium (METAMUCIL) 58.6 % packet Take 1 packet by mouth as needed.    Marland Kitchen oxyCODONE (OXY IR/ROXICODONE) 5 MG immediate release tablet Take 1-2 tablets (5-10 mg total) by mouth every 4 (four) hours as needed for moderate pain. (Patient not taking: Reported on 10/29/2014) 30 tablet 0   No facility-administered medications prior to visit.    ROS Review of Systems  Constitutional: Negative for fever, chills, activity change and appetite change.  HENT: Positive for congestion, postnasal drip, rhinorrhea and sinus pressure. Negative for ear discharge, ear pain, hearing loss, nosebleeds, sneezing and trouble swallowing.   Respiratory: Negative for chest tightness and shortness of breath.   Cardiovascular: Negative for chest pain and palpitations.  Skin: Negative for rash.    Objective:  BP 117/77 mmHg  Pulse 68  Temp(Src) 98 F (36.7 C) (Oral)  Ht 5' 8.25" (1.734 m)  Wt 197 lb 12.8 oz (89.721 kg)  BMI 29.84 kg/m2  BP Readings from Last 3 Encounters:  10/29/14 117/77  07/27/14 138/72  07/14/14 133/89    Wt Readings from Last 3 Encounters:  10/29/14 197 lb 12.8 oz (89.721 kg)  07/26/14 203 lb 12.8 oz (92.443 kg)  07/14/14 213 lb (96.616 kg)  Physical Exam  Constitutional: He appears well-developed and well-nourished.  HENT:  Head: Normocephalic and atraumatic.  Right Ear: Tympanic membrane and external ear normal. No decreased hearing is noted.  Left Ear: Tympanic membrane and external ear normal. No decreased hearing is noted.  Nose: Mucosal edema present. Right sinus exhibits no frontal sinus tenderness. Left sinus exhibits no frontal sinus tenderness.  Mouth/Throat: No oropharyngeal exudate or  posterior oropharyngeal erythema.  Neck: No Brudzinski's sign noted.  Pulmonary/Chest: Breath sounds normal. No respiratory distress.  Lymphadenopathy:       Head (right side): No preauricular adenopathy present.       Head (left side): No preauricular adenopathy present.       Right cervical: No superficial cervical adenopathy present.      Left cervical: No superficial cervical adenopathy present.    Lab Results  Component Value Date   HGBA1C 6.2* 07/05/2010    Lab Results  Component Value Date   WBC 12.4* 07/27/2014   HGB 15.4 07/27/2014   HCT 45.2 07/27/2014   PLT 206 07/27/2014   GLUCOSE 119* 07/27/2014   CHOL 160 03/25/2014   TRIG 132 03/25/2014   HDL 39* 03/25/2014   LDLCALC 95 03/25/2014   ALT 45* 03/25/2014   AST 34 03/25/2014   NA 139 07/27/2014   K 4.0 07/27/2014   CL 106 07/27/2014   CREATININE 0.92 07/27/2014   BUN 9 07/27/2014   CO2 26 07/27/2014   TSH 1.157 07/06/2010   INR 1.03 10/26/2013   HGBA1C 6.2* 07/05/2010    No results found.  Assessment & Plan:   Anthony [name] was seen today for uri.  Diagnoses and all orders for this visit:  Acute exacerbation of chronic obstructive pulmonary disease (COPD) (Anthony [name])  Other orders -     levofloxacin (LEVAQUIN) 500 MG tablet; Take 1 tablet (500 mg total) by mouth daily. -     benzonatate (TESSALON) 200 MG capsule; Take 1 capsule (200 mg total) by mouth 3 (three) times daily as needed for cough.   I have discontinued Mr. Packett psyllium and oxyCODONE. I am also having him start on levofloxacin and benzonatate. Additionally, I am having him maintain his aspirin EC, LUTEIN PO, Vitamin D (Cholecalciferol), Omega-3 Fatty Acids (FISH OIL PO), glucosamine-chondroitin, acetaminophen, senna, and traMADol.  Meds ordered this encounter  Medications  . levofloxacin (LEVAQUIN) 500 MG tablet    Sig: Take 1 tablet (500 mg total) by mouth daily.    Dispense:  10 tablet    Refill:  0  . benzonatate (TESSALON) 200 MG capsule     Sig: Take 1 capsule (200 mg total) by mouth 3 (three) times daily as needed for cough.    Dispense:  20 capsule    Refill:  0     Follow-up: Return if symptoms worsen or fail to improve.  Claretta Fraise, M.D.

## 2014-11-04 ENCOUNTER — Ambulatory Visit (INDEPENDENT_AMBULATORY_CARE_PROVIDER_SITE_OTHER): Payer: Medicare Other | Admitting: Family Medicine

## 2014-11-04 ENCOUNTER — Other Ambulatory Visit: Payer: Self-pay | Admitting: Family Medicine

## 2014-11-04 ENCOUNTER — Ambulatory Visit (INDEPENDENT_AMBULATORY_CARE_PROVIDER_SITE_OTHER): Payer: Medicare Other

## 2014-11-04 ENCOUNTER — Telehealth: Payer: Self-pay

## 2014-11-04 ENCOUNTER — Encounter: Payer: Self-pay | Admitting: Family Medicine

## 2014-11-04 VITALS — BP 123/81 | HR 77 | Temp 96.6°F | Ht 69.5 in | Wt 196.6 lb

## 2014-11-04 DIAGNOSIS — R05 Cough: Secondary | ICD-10-CM

## 2014-11-04 DIAGNOSIS — R059 Cough, unspecified: Secondary | ICD-10-CM

## 2014-11-04 DIAGNOSIS — J209 Acute bronchitis, unspecified: Secondary | ICD-10-CM | POA: Diagnosis not present

## 2014-11-04 DIAGNOSIS — I2510 Atherosclerotic heart disease of native coronary artery without angina pectoris: Secondary | ICD-10-CM | POA: Diagnosis not present

## 2014-11-04 DIAGNOSIS — R911 Solitary pulmonary nodule: Secondary | ICD-10-CM

## 2014-11-04 MED ORDER — FLUTICASONE FUROATE-VILANTEROL 100-25 MCG/INH IN AEPB: 1.0000 | INHALATION_SPRAY | Freq: Every day | RESPIRATORY_TRACT | Status: DC

## 2014-11-04 NOTE — Progress Notes (Signed)
Subjective:    Patient ID: Anthony Form., male    DOB: [birth year], 64 y.o.   MRN: [id number]  HPI 51 or gentleman here for follow-up visit he was seen about a week ago and placed on Levaquin for exacerbation of COPD. Since then he thinks he has gotten worse. He has more wheezing. His and no fever or chills. Cough is not productive. He does not appear he'll.  He also complains of seeing some flashing lights along the periphery of his vision. He was told by pharmacy that's a side effect of tramadol but I have not seen that and I would be concerned that there may be impending retinal detachment.  Patient Active Problem List   Diagnosis Date Noted  . Recurrent left inguinal hernia 07/26/2014  . Chronic right hip pain 06/23/2014  . S/P bilateral inguinal hernia repair 10/30/2013  . Nodule of left lung 05/29/2013  . Bilateral inguinal hernia 05/28/2013  . Umbilical hernia 67/89/3810  . Major psychotic depression, recurrent ([place]) 12/16/2012  . Inadequate anticoagulation 04/29/2012  . Hypocalcemia 07/28/2010  . CAD (coronary artery disease)   . HLD (hyperlipidemia)   . COPD (chronic obstructive pulmonary disease) ([place])   . Tobacco abuse   . Depression    Outpatient Encounter Prescriptions as of 11/04/2014  Medication Sig  . acetaminophen (TYLENOL) 325 MG tablet Take 650 mg by mouth every 6 (six) hours as needed.  Marland Kitchen aspirin EC 81 MG tablet Take 81 mg by mouth 2 (two) times daily.   . benzonatate (TESSALON) 200 MG capsule Take 1 capsule (200 mg total) by mouth 3 (three) times daily as needed for cough.  Marland Kitchen glucosamine-chondroitin 500-400 MG tablet Take 1 tablet by mouth 3 (three) times daily.  Marland Kitchen levofloxacin (LEVAQUIN) 500 MG tablet Take 1 tablet (500 mg total) by mouth daily.  . LUTEIN PO Take 1-2 tablets by mouth every morning.  . Omega-3 Fatty Acids (FISH OIL PO) Take by mouth.  . senna (SENOKOT) 8.6 MG TABS tablet Take 1 tablet by mouth 4 (four) times daily as needed for mild  constipation.  . traMADol (ULTRAM) 50 MG tablet TAKE TWO TABLETS BY MOUTH EVERY 12 HOURS AS NEEDED  . Vitamin D, Cholecalciferol, 1000 UNITS CAPS Take 1 capsule by mouth.   No facility-administered encounter medications on file as of 11/04/2014.      Review of Systems  Constitutional: Positive for fatigue.  Respiratory: Positive for cough.   Cardiovascular: Negative.   Neurological: Negative.   Psychiatric/Behavioral: Negative.        Objective:   Physical Exam  Constitutional: He is oriented to person, place, and time. He appears well-developed and well-nourished.  Does not appear ill  HENT:  Head: Normocephalic.  Mouth/Throat: Oropharynx is clear and moist.  Cardiovascular: Normal rate, regular rhythm and normal heart sounds.   Pulmonary/Chest: Effort normal. He has wheezes.  Neurological: He is alert and oriented to person, place, and time.          Assessment & Plan:  1. Cough Chest x-ray shows no infiltrate - DG Chest 2 View; Future - Ambulatory referral to Ophthalmology  2. Acute bronchitis, unspecified organism Finish prescription of Levaquin. Given sample of Brio. I think this will help his wheezing and shortness of breath. Continue to drink plenty of fluids and get extra rest.  Wardell Honour MD  Notice: Thisdictation was prepared with Dragon dictation along with smaller phrase technology. Any transcriptional errors that result from this process are unintentional and  may not be corrected upon review

## 2014-11-09 ENCOUNTER — Telehealth: Payer: Self-pay | Admitting: Family Medicine

## 2014-11-10 ENCOUNTER — Encounter (INDEPENDENT_AMBULATORY_CARE_PROVIDER_SITE_OTHER): Payer: Medicare Other | Admitting: Ophthalmology

## 2014-11-10 DIAGNOSIS — H353131 Nonexudative age-related macular degeneration, bilateral, early dry stage: Secondary | ICD-10-CM

## 2014-11-10 DIAGNOSIS — H2513 Age-related nuclear cataract, bilateral: Secondary | ICD-10-CM | POA: Diagnosis not present

## 2014-11-10 DIAGNOSIS — H43813 Vitreous degeneration, bilateral: Secondary | ICD-10-CM | POA: Diagnosis not present

## 2014-11-11 ENCOUNTER — Ambulatory Visit
Admission: RE | Admit: 2014-11-11 | Discharge: 2014-11-11 | Disposition: A | Discharge status: Home / Self Care | Payer: Medicare Other | Source: Ambulatory Visit | Attending: Family Medicine | Admitting: Family Medicine

## 2014-11-11 DIAGNOSIS — R911 Solitary pulmonary nodule: Secondary | ICD-10-CM

## 2014-11-11 DIAGNOSIS — R918 Other nonspecific abnormal finding of lung field: Secondary | ICD-10-CM | POA: Diagnosis not present

## 2014-11-11 NOTE — Telephone Encounter (Signed)
Notes faxed.

## 2014-11-16 ENCOUNTER — Telehealth: Payer: Self-pay | Admitting: Family Medicine

## 2014-11-16 NOTE — Telephone Encounter (Signed)
Patient of results.  

## 2014-11-29 ENCOUNTER — Telehealth: Payer: Self-pay | Admitting: Family Medicine

## 2014-11-29 ENCOUNTER — Other Ambulatory Visit: Payer: Self-pay | Admitting: Family Medicine

## 2014-11-29 MED ORDER — TRAMADOL HCL 50 MG PO TABS: ORAL_TABLET | ORAL | Status: DC

## 2014-11-29 NOTE — Telephone Encounter (Signed)
#  10 printed for pt pick up - will follow up with Dr Sabra Heck Wednesday

## 2014-11-29 NOTE — Telephone Encounter (Signed)
Patient aware that Dr. Sabra Heck will not be back until Wednesday but he is completely out. Patient does have appointment on Wed with Sabra Heck. Patient wants to know if we can fill until the appointment?

## 2014-11-29 NOTE — Telephone Encounter (Signed)
Please hold and forwarded to Dr. Sabra Heck when he returns to give him enough to do until Dr. Sabra Heck returns

## 2014-12-01 ENCOUNTER — Ambulatory Visit (INDEPENDENT_AMBULATORY_CARE_PROVIDER_SITE_OTHER): Payer: Medicare Other | Admitting: Family Medicine

## 2014-12-01 ENCOUNTER — Encounter: Payer: Self-pay | Admitting: Family Medicine

## 2014-12-01 VITALS — BP 126/82 | HR 68 | Temp 96.7°F | Ht 69.5 in | Wt 204.2 lb

## 2014-12-01 DIAGNOSIS — M25551 Pain in right hip: Secondary | ICD-10-CM

## 2014-12-01 DIAGNOSIS — G8929 Other chronic pain: Secondary | ICD-10-CM | POA: Diagnosis not present

## 2014-12-01 DIAGNOSIS — F32A Depression, unspecified: Secondary | ICD-10-CM

## 2014-12-01 DIAGNOSIS — F329 Major depressive disorder, single episode, unspecified: Secondary | ICD-10-CM

## 2014-12-01 DIAGNOSIS — I2510 Atherosclerotic heart disease of native coronary artery without angina pectoris: Secondary | ICD-10-CM

## 2014-12-01 MED ORDER — TRAMADOL HCL 50 MG PO TABS: ORAL_TABLET | ORAL | Status: DC

## 2014-12-01 MED ORDER — BUPROPION HCL 100 MG PO TABS: ORAL_TABLET | ORAL | Status: DC

## 2014-12-01 NOTE — Progress Notes (Signed)
   Subjective:    Patient ID: Anthony Form., male    DOB: July 04, 1950, 64 y.o.   MRN: [id number]  HPI  Patient is here today with chief complaint of fatigue and malaise and just general loss of interest. He is smoking 2 packs a day.) Pain limits his activities. He has been on multiple antidepressants in the past and tells me the one that worked best was welbutrin.   Review of Systems  Cardiovascular: Negative.   Gastrointestinal: Negative.   Musculoskeletal: Positive for arthralgias.  Psychiatric/Behavioral: Positive for decreased concentration.      BP 126/82 mmHg  Pulse 68  Temp(Src) 96.7 F (35.9 C) (Oral)  Ht 5' 9.5" (1.765 m)  Wt 204 lb 4 oz (92.647 kg)  BMI 29.74 kg/m2  Objective:   Physical Exam  Constitutional: He is oriented to person, place, and time. He appears well-developed and well-nourished.  Cardiovascular: Normal rate and regular rhythm.   Pulmonary/Chest: Effort normal and breath sounds normal.  Abdominal: Soft. Bowel sounds are normal.  Neurological: He is alert and oriented to person, place, and time.          Assessment & Plan:  1. Chronic right hip pain Refill tramadol with same instructions  2. Depression Start Wellbutrin 100 mg twice a day and increase to 100 mg 3 times a day - Testosterone,Free and Total - TSH  Wardell Honour MD

## 2014-12-01 NOTE — Addendum Note (Signed)
Addended by: Zannie Cove on: 12/01/2014 12:32 PM   Modules accepted: Orders

## 2014-12-08 ENCOUNTER — Encounter: Payer: Self-pay | Admitting: Family Medicine

## 2014-12-08 ENCOUNTER — Ambulatory Visit (INDEPENDENT_AMBULATORY_CARE_PROVIDER_SITE_OTHER): Payer: Medicare Other | Admitting: Family Medicine

## 2014-12-08 VITALS — BP 130/81 | HR 65 | Temp 98.3°F | Ht 69.5 in | Wt 202.2 lb

## 2014-12-08 DIAGNOSIS — M25551 Pain in right hip: Secondary | ICD-10-CM

## 2014-12-08 DIAGNOSIS — F333 Major depressive disorder, recurrent, severe with psychotic symptoms: Secondary | ICD-10-CM

## 2014-12-08 DIAGNOSIS — I2510 Atherosclerotic heart disease of native coronary artery without angina pectoris: Secondary | ICD-10-CM | POA: Diagnosis not present

## 2014-12-08 DIAGNOSIS — G8929 Other chronic pain: Secondary | ICD-10-CM

## 2014-12-08 MED ORDER — QUETIAPINE FUMARATE 25 MG PO TABS: 25.0000 mg | ORAL_TABLET | Freq: Every day | ORAL | Status: DC

## 2014-12-08 NOTE — Progress Notes (Signed)
Subjective:    Patient ID: Anthony [name]., male    DOB: January 26, 1950, 64 y.o.   MRN: [id number]  HPI 64 year old who is having a problem with right hip pain insomnia and depression. We restarted Wellbutrin last week and it really has not had time to have much of an effect. He tells me he is only slept for 5 hours since last visit. He has been on multiple medications for sleep and depression and pretty much has declared that none of them work. He does appear depressed. He lives alone and family have not supported him. He does not appear psychotic even though when he was in the hospital with hernia repair the consulting psychiatrist said that he was psychologically depressed. I almost got him to say yes to psychiatric consultation but then he says the reality is that it he does not have money to pay psychiatry I offered him stronger medicine for his hip pain as well as orthopedic referral but he declines both of these saying that he will continue with tramadol.  He is difficult to treat because he rejects most of the options that I am able to offer.  Patient Active Problem List   Diagnosis Date Noted  . Recurrent left inguinal hernia 07/26/2014  . Chronic right hip pain 06/23/2014  . S/P bilateral inguinal hernia repair 10/30/2013  . Nodule of left lung 05/29/2013  . Bilateral inguinal hernia 05/28/2013  . Umbilical hernia [id number]  . Major psychotic depression, recurrent ([place]) 12/16/2012  . Inadequate anticoagulation 04/29/2012  . Hypocalcemia 07/28/2010  . CAD (coronary artery disease)   . HLD (hyperlipidemia)   . COPD (chronic obstructive pulmonary disease) ([place])   . Tobacco abuse   . Depression    Outpatient Encounter Prescriptions as of 12/08/2014  Medication Sig  . acetaminophen (TYLENOL) 325 MG tablet Take 650 mg by mouth every 6 (six) hours as needed.  Marland Kitchen aspirin EC 81 MG tablet Take 81 mg by mouth 2 (two) times daily.   Marland Kitchen buPROPion (WELLBUTRIN) 100 MG tablet Start with 100 mg  by mouth twice a day. After 3 days increase to 100 mg 3 times a day  . Fluticasone Furoate-Vilanterol (BREO ELLIPTA) 100-25 MCG/INH AEPB Inhale 1 Inhaler into the lungs daily.  Marland Kitchen glucosamine-chondroitin 500-400 MG tablet Take 1 tablet by mouth 3 (three) times daily.  . LUTEIN PO Take 1-2 tablets by mouth every morning.  . Omega-3 Fatty Acids (FISH OIL PO) Take by mouth.  . senna (SENOKOT) 8.6 MG TABS tablet Take 1 tablet by mouth 4 (four) times daily as needed for mild constipation.  . traMADol (ULTRAM) 50 MG tablet TAKE TWO TABLETS BY MOUTH EVERY 12 HOURS AS NEEDED  . Vitamin D, Cholecalciferol, 1000 UNITS CAPS Take 1 capsule by mouth.   No facility-administered encounter medications on file as of 12/08/2014.      Review of Systems  Constitutional: Positive for activity change and fatigue.  Respiratory: Negative.   Cardiovascular: Negative.   Musculoskeletal: Positive for arthralgias.  Psychiatric/Behavioral: Positive for sleep disturbance. The patient is nervous/anxious.        Objective:   Physical Exam  Constitutional: He is oriented to person, place, and time. He appears well-developed and well-nourished.  Cardiovascular: Normal rate.   Pulmonary/Chest: Effort normal and breath sounds normal.  Neurological: He is alert and oriented to person, place, and time.  Psychiatric: He has a normal mood and affect. Judgment normal.  Appears to be in touch with reality and I find  no evidence of psychosis. I do believe he is overtly depressed and I think he probably needs help of a professional such a psychiatrist.          Assessment & Plan:  1. Chronic right hip pain Tenuous tramadol as he has refused other medicines or injection  2. Major psychotic depression, recurrent ([name]) Again I'm not sure he has psychotic but is certainly depressed. In an effort to help him sleep at night of offered low-dose Seroquel 25 mg not to exceed 50 mg and one night hopefully this will help and if he  can get some rest perhaps his outlook on other things will be improved.  Wardell Honour MD

## 2014-12-09 ENCOUNTER — Other Ambulatory Visit: Payer: Medicare Other

## 2014-12-09 DIAGNOSIS — F329 Major depressive disorder, single episode, unspecified: Secondary | ICD-10-CM | POA: Diagnosis not present

## 2014-12-09 NOTE — Progress Notes (Signed)
Lab only 

## 2014-12-11 LAB — TSH: TSH: 1.08 u[IU]/mL (ref 0.450–4.500)

## 2014-12-11 LAB — TESTOSTERONE,FREE AND TOTAL
TESTOSTERONE FREE: 9.8 pg/mL (ref 6.6–18.1)
TESTOSTERONE: 260 ng/dL — AB (ref 348–1197)

## 2014-12-14 ENCOUNTER — Telehealth: Payer: Self-pay | Admitting: Family Medicine

## 2014-12-14 NOTE — Telephone Encounter (Signed)
Patient feels like he is having reaction from the serquel. He feels that it is causing his legs to be jumpy. Patient aware that Sabra Heck is off til Thursday.

## 2014-12-16 ENCOUNTER — Telehealth: Payer: Self-pay | Admitting: *Deleted

## 2014-12-16 MED ORDER — TESTOSTERONE CYPIONATE 200 MG/ML IM SOLN: 200.0000 mg | INTRAMUSCULAR | Status: DC

## 2014-12-16 NOTE — Telephone Encounter (Signed)
Patient will try taking half of the dose and let us know if it helps.

## 2014-12-16 NOTE — Addendum Note (Signed)
Addended by: Shelbie Ammons on: 12/16/2014 03:51 PM   Modules accepted: Orders

## 2014-12-16 NOTE — Telephone Encounter (Signed)
We had started Seroquel to help him sleep. I would wonder isn't helping. We can try to reduce the dose by half to see if that helps [place] restless legs.

## 2014-12-16 NOTE — Telephone Encounter (Signed)
Patient aware, script for testosterone is ready.

## 2014-12-17 ENCOUNTER — Ambulatory Visit (INDEPENDENT_AMBULATORY_CARE_PROVIDER_SITE_OTHER): Payer: Medicare Other | Admitting: *Deleted

## 2014-12-17 DIAGNOSIS — E291 Testicular hypofunction: Secondary | ICD-10-CM

## 2014-12-17 DIAGNOSIS — E349 Endocrine disorder, unspecified: Secondary | ICD-10-CM

## 2014-12-17 MED ORDER — TESTOSTERONE CYPIONATE 200 MG/ML IM SOLN
200.0000 mg | INTRAMUSCULAR | Status: DC
  Administered 2014-12-17 – 2015-04-25 (×5): 200 mg via INTRAMUSCULAR

## 2014-12-17 NOTE — Patient Instructions (Signed)

## 2014-12-17 NOTE — Progress Notes (Signed)
Pt given testosterone injection IM LUOQ and tolerated well. °

## 2014-12-22 ENCOUNTER — Telehealth: Payer: Self-pay | Admitting: Family Medicine

## 2014-12-22 NOTE — Telephone Encounter (Signed)
The question still remains does he sleep with Seroquel if he does he can still cut the dose again

## 2014-12-24 NOTE — Telephone Encounter (Signed)
Stp and he says no he isn't sleeping. He says it doesn't even make him drowsy.

## 2014-12-27 ENCOUNTER — Telehealth: Payer: Self-pay | Admitting: Family Medicine

## 2014-12-27 NOTE — Telephone Encounter (Signed)
Stp and advised we are waiting on Dr. Sabra Heck to return the previous message with his advise. Pt voiced understanding.

## 2014-12-29 NOTE — Telephone Encounter (Signed)
Patient has an office appointment for 12-20-14.

## 2014-12-29 NOTE — Telephone Encounter (Signed)
Probably needs to return for office visit

## 2014-12-30 ENCOUNTER — Encounter: Payer: Self-pay | Admitting: Family Medicine

## 2014-12-30 ENCOUNTER — Ambulatory Visit (INDEPENDENT_AMBULATORY_CARE_PROVIDER_SITE_OTHER): Payer: Medicare Other | Admitting: Family Medicine

## 2014-12-30 VITALS — BP 109/68 | HR 66 | Temp 97.2°F | Ht 69.5 in | Wt 208.0 lb

## 2014-12-30 DIAGNOSIS — F329 Major depressive disorder, single episode, unspecified: Secondary | ICD-10-CM

## 2014-12-30 DIAGNOSIS — M25559 Pain in unspecified hip: Secondary | ICD-10-CM | POA: Diagnosis not present

## 2014-12-30 DIAGNOSIS — I2510 Atherosclerotic heart disease of native coronary artery without angina pectoris: Secondary | ICD-10-CM

## 2014-12-30 DIAGNOSIS — F32A Depression, unspecified: Secondary | ICD-10-CM

## 2014-12-30 NOTE — Progress Notes (Signed)
   Subjective:    Patient ID: Anthony Form., male    DOB: 1950-03-01, 64 y.o.   MRN: [id number]  HPI 64 year old man is here to follow-up insomnia and depression hip pain. At his last visit I gave him Seroquel thinking this would help him sleep. He did call some drowsiness but seemed to cause restless leg syndrome times. We tried adjusting the dose over the phone without much success. Previously we have been limited by his financial abilities but now he has Medicare which should open more possibilities to treatment both with drugs and consultations. We spent some time talking about his depression and insomnia I think he is now willing to see psychiatry. He has tried many hypnotics as well as antidepressants without much relief.  He also has right hip pain that we have been treating with tramadol and injections. He has seen orthopedics at least on one occasion but feels like he is ready to do something about his hip pain again and requests another orthopedic consult.    Review of Systems  Constitutional: Negative.   Musculoskeletal: Positive for arthralgias.  Psychiatric/Behavioral: Positive for behavioral problems and sleep disturbance.      BP 109/68 mmHg  Pulse 66  Temp(Src) 97.2 F (36.2 C) (Oral)  Ht 5' 9.5" (1.765 m)  Wt 208 lb (94.348 kg)  BMI 30.29 kg/m2   Objective:   Physical Exam  Constitutional: He is oriented to person, place, and time. He appears well-developed and well-nourished.  Cardiovascular: Normal rate.   Pulmonary/Chest: Effort normal and breath sounds normal.  Neurological: He is alert and oriented to person, place, and time.  Psychiatric:  I think he is depressed. He is taking Wellbutrin and feels like there is been some improvement. He also is receiving testosterone shots thinking that that might help his moods and depression some          Assessment & Plan:  1. Hip pain, unspecified laterality Increase tramadol to 100 mg 4 times a day as needed  for pain and referral to orthopedic - Ambulatory referral to Orthopedic Surgery  2. Depression Refer to psychiatry continue Wellbutrin pending consultation - Ambulatory referral to Psychiatry   Wardell Honour MD

## 2015-01-04 ENCOUNTER — Other Ambulatory Visit: Payer: Self-pay | Admitting: Family Medicine

## 2015-01-04 MED ORDER — TRAMADOL HCL 50 MG PO TABS: ORAL_TABLET | ORAL | Status: DC

## 2015-01-04 NOTE — Telephone Encounter (Signed)
Patient requests refill of Tramadol, please route response to pools

## 2015-01-05 NOTE — Telephone Encounter (Signed)
Aware , tramadol script ready. 

## 2015-01-06 ENCOUNTER — Telehealth: Payer: Self-pay

## 2015-01-13 ENCOUNTER — Telehealth: Payer: Self-pay | Admitting: Family Medicine

## 2015-01-13 NOTE — Telephone Encounter (Signed)
Per Dr. Sabra Heck seroquel was d/c'd at last visit. lmovm for pt to call back

## 2015-01-13 NOTE — Telephone Encounter (Signed)
Pt is needing something else to help him sleep, since the seroquel was d/c'd last visit and he is still not sleeping.

## 2015-01-14 MED ORDER — DOXEPIN HCL 50 MG PO CAPS: 50.0000 mg | ORAL_CAPSULE | Freq: Every day | ORAL | Status: DC

## 2015-01-14 NOTE — Telephone Encounter (Signed)
Doxepin sent to [facility]

## 2015-01-14 NOTE — Telephone Encounter (Signed)
I think he has tried most sleeping aids without much success. I would recommend going to doxepin 50 mg at bedtime #30. I would expect he might decline but I do not have other options at this point.

## 2015-01-21 ENCOUNTER — Ambulatory Visit (INDEPENDENT_AMBULATORY_CARE_PROVIDER_SITE_OTHER): Payer: Medicare Other | Admitting: *Deleted

## 2015-01-21 DIAGNOSIS — E349 Endocrine disorder, unspecified: Secondary | ICD-10-CM

## 2015-01-21 DIAGNOSIS — E291 Testicular hypofunction: Secondary | ICD-10-CM | POA: Diagnosis not present

## 2015-01-27 DIAGNOSIS — M25551 Pain in right hip: Secondary | ICD-10-CM | POA: Diagnosis not present

## 2015-01-27 DIAGNOSIS — M1611 Unilateral primary osteoarthritis, right hip: Secondary | ICD-10-CM | POA: Diagnosis not present

## 2015-02-11 ENCOUNTER — Other Ambulatory Visit: Payer: Self-pay | Admitting: Family Medicine

## 2015-02-15 MED ORDER — TRAMADOL HCL 50 MG PO TABS: ORAL_TABLET | ORAL | Status: DC

## 2015-02-15 NOTE — Telephone Encounter (Signed)
Last filled 01/04/15, last seen 12/30/14

## 2015-02-16 ENCOUNTER — Other Ambulatory Visit: Payer: Self-pay | Admitting: *Deleted

## 2015-02-16 MED ORDER — DOXEPIN HCL 50 MG PO CAPS: 50.0000 mg | ORAL_CAPSULE | Freq: Every day | ORAL | Status: DC

## 2015-02-21 ENCOUNTER — Ambulatory Visit (INDEPENDENT_AMBULATORY_CARE_PROVIDER_SITE_OTHER): Payer: Medicare Other | Admitting: *Deleted

## 2015-02-21 DIAGNOSIS — E349 Endocrine disorder, unspecified: Secondary | ICD-10-CM

## 2015-02-21 DIAGNOSIS — E291 Testicular hypofunction: Secondary | ICD-10-CM | POA: Diagnosis not present

## 2015-02-21 NOTE — Progress Notes (Signed)
Pt given Testosterone 200mg  injection IM LUOQ and tolerated well.

## 2015-02-28 DIAGNOSIS — M1611 Unilateral primary osteoarthritis, right hip: Secondary | ICD-10-CM | POA: Diagnosis not present

## 2015-02-28 DIAGNOSIS — M25551 Pain in right hip: Secondary | ICD-10-CM | POA: Diagnosis not present

## 2015-03-01 ENCOUNTER — Encounter (HOSPITAL_COMMUNITY): Payer: Self-pay

## 2015-03-01 ENCOUNTER — Other Ambulatory Visit (HOSPITAL_COMMUNITY): Payer: Self-pay | Admitting: Orthopedic Surgery

## 2015-03-01 ENCOUNTER — Encounter: Payer: Self-pay | Admitting: Family Medicine

## 2015-03-01 ENCOUNTER — Ambulatory Visit (INDEPENDENT_AMBULATORY_CARE_PROVIDER_SITE_OTHER): Payer: Medicare Other | Admitting: Family Medicine

## 2015-03-01 VITALS — BP 120/72 | HR 83 | Temp 97.5°F | Ht 69.0 in | Wt 206.0 lb

## 2015-03-01 DIAGNOSIS — R52 Pain, unspecified: Secondary | ICD-10-CM

## 2015-03-01 DIAGNOSIS — L820 Inflamed seborrheic keratosis: Secondary | ICD-10-CM | POA: Diagnosis not present

## 2015-03-01 NOTE — Progress Notes (Signed)
   Subjective:    Patient ID: Anthony Form., male    DOB: 04/20/1950, 65 y.o.   MRN: [id number]  HPI Patient here today for skin lesion of left forearm.     Patient Active Problem List   Diagnosis Date Noted  . Recurrent left inguinal hernia 07/26/2014  . Chronic right hip pain 06/23/2014  . S/P bilateral inguinal hernia repair 10/30/2013  . Nodule of left lung 05/29/2013  . Bilateral inguinal hernia 05/28/2013  . Umbilical hernia [id number]  . Major psychotic depression, recurrent ([place]) 12/16/2012  . Inadequate anticoagulation 04/29/2012  . Hypocalcemia 07/28/2010  . CAD (coronary artery disease)   . HLD (hyperlipidemia)   . COPD (chronic obstructive pulmonary disease) ([place])   . Tobacco abuse   . Depression    Outpatient Encounter Prescriptions as of 03/01/2015  Medication Sig  . acetaminophen (TYLENOL) 325 MG tablet Take 650 mg by mouth every 6 (six) hours as needed.  Marland Kitchen aspirin EC 81 MG tablet Take 81 mg by mouth 2 (two) times daily.   Marland Kitchen buPROPion (WELLBUTRIN) 100 MG tablet Start with 100 mg by mouth twice a day. After 3 days increase to 100 mg 3 times a day  . doxepin (SINEQUAN) 50 MG capsule Take 1 capsule (50 mg total) by mouth at bedtime.  . Fluticasone Furoate-Vilanterol (BREO ELLIPTA) 100-25 MCG/INH AEPB Inhale 1 Inhaler into the lungs daily.  Marland Kitchen glucosamine-chondroitin 500-400 MG tablet Take 1 tablet by mouth 3 (three) times daily.  . LUTEIN PO Take 1-2 tablets by mouth every morning.  . Omega-3 Fatty Acids (FISH OIL PO) Take by mouth.  . QUEtiapine (SEROQUEL) 25 MG tablet Take 1 tablet (25 mg total) by mouth at bedtime.  . senna (SENOKOT) 8.6 MG TABS tablet Take 1 tablet by mouth 4 (four) times daily as needed for mild constipation.  Marland Kitchen testosterone cypionate (DEPO-TESTOSTERONE) 200 MG/ML injection Inject 1 mL (200 mg total) into the muscle every 28 (twenty-eight) days.  . traMADol (ULTRAM) 50 MG tablet TAKE TWO TABLETS BY MOUTH EVERY 12 HOURS AS NEEDED  . Vitamin  D, Cholecalciferol, 1000 UNITS CAPS Take 1 capsule by mouth.   Facility-Administered Encounter Medications as of 03/01/2015  Medication  . testosterone cypionate (DEPOTESTOSTERONE CYPIONATE) injection 200 mg      Review of Systems  Constitutional: Negative.   HENT: Negative.   Eyes: Negative.   Respiratory: Negative.   Cardiovascular: Negative.   Gastrointestinal: Negative.   Endocrine: Negative.   Genitourinary: Negative.   Musculoskeletal: Negative.   Skin: Negative.        Left forearm skin lesion   Allergic/Immunologic: Negative.   Neurological: Negative.   Hematological: Negative.   Psychiatric/Behavioral: Negative.        Objective:   Physical Exam  BP 120/72 mmHg  Pulse 83  Temp(Src) 97.5 F (36.4 C) (Oral)  Ht 5\' 9"  (1.753 m)  Wt 206 lb (93.441 kg)  BMI 30.41 kg/m2       Assessment & Plan:

## 2015-03-01 NOTE — Progress Notes (Signed)
S patient has a lesion on left forearm consistent with inflamed seborrheic keratosis. We discussed options of shave excision versus freezing. He chose the latter.   O: The lesion was frozen using liquid nitrogen with the device which limited the area frozen. It was allowed to thaw and then refrozen.  A,P: Expect lesion to follow off within 2 weeks if it does not consider re-freezing or shave excision at next visit.

## 2015-03-02 ENCOUNTER — Ambulatory Visit (HOSPITAL_COMMUNITY)
Admission: RE | Admit: 2015-03-02 | Discharge: 2015-03-02 | Disposition: A | Discharge status: Home / Self Care | Payer: Medicare Other | Source: Ambulatory Visit | Attending: Cardiovascular Disease | Admitting: Cardiovascular Disease

## 2015-03-02 DIAGNOSIS — E785 Hyperlipidemia, unspecified: Secondary | ICD-10-CM | POA: Diagnosis not present

## 2015-03-02 DIAGNOSIS — I82531 Chronic embolism and thrombosis of right popliteal vein: Secondary | ICD-10-CM | POA: Diagnosis not present

## 2015-03-02 DIAGNOSIS — M79604 Pain in right leg: Secondary | ICD-10-CM | POA: Diagnosis not present

## 2015-03-02 DIAGNOSIS — I10 Essential (primary) hypertension: Secondary | ICD-10-CM | POA: Insufficient documentation

## 2015-03-02 DIAGNOSIS — R52 Pain, unspecified: Secondary | ICD-10-CM | POA: Diagnosis not present

## 2015-03-02 DIAGNOSIS — I82511 Chronic embolism and thrombosis of right femoral vein: Secondary | ICD-10-CM | POA: Insufficient documentation

## 2015-03-02 DIAGNOSIS — I2510 Atherosclerotic heart disease of native coronary artery without angina pectoris: Secondary | ICD-10-CM | POA: Diagnosis not present

## 2015-03-02 DIAGNOSIS — Z720 Tobacco use: Secondary | ICD-10-CM | POA: Insufficient documentation

## 2015-03-24 ENCOUNTER — Ambulatory Visit (INDEPENDENT_AMBULATORY_CARE_PROVIDER_SITE_OTHER): Payer: Medicare Other | Admitting: *Deleted

## 2015-03-24 DIAGNOSIS — E291 Testicular hypofunction: Secondary | ICD-10-CM

## 2015-03-24 DIAGNOSIS — E349 Endocrine disorder, unspecified: Secondary | ICD-10-CM

## 2015-03-24 NOTE — Progress Notes (Signed)
Pt given Testosterone injection IM RUOQ and tolerated well. 

## 2015-04-05 ENCOUNTER — Telehealth: Payer: Self-pay | Admitting: Family Medicine

## 2015-04-06 NOTE — Telephone Encounter (Signed)
Spoke to pt and advised to take a stool softener which he says he is already taking, advised he can also take Miralax with the stool softener if needed. Pt voiced understanding.

## 2015-04-25 ENCOUNTER — Ambulatory Visit (INDEPENDENT_AMBULATORY_CARE_PROVIDER_SITE_OTHER): Payer: Medicare Other | Admitting: *Deleted

## 2015-04-25 DIAGNOSIS — E291 Testicular hypofunction: Secondary | ICD-10-CM | POA: Diagnosis not present

## 2015-04-25 DIAGNOSIS — E349 Endocrine disorder, unspecified: Secondary | ICD-10-CM

## 2015-04-25 NOTE — Progress Notes (Signed)
Pt given testosterone injection IM LUOQ and tolerated well. °

## 2015-04-28 NOTE — H&P (Signed)
TOTAL HIP ADMISSION H&P  Patient is admitted for right total hip arthroplasty, anterior approach.  Subjective:  Chief Complaint:     Right hip primary OA / pain  HPI: Anthony Form., 65 y.o. male, has a history of pain and functional disability in the right hip(s) due to arthritis and patient has failed non-surgical conservative treatments for greater than 12 weeks to include NSAID's and/or analgesics, use of assistive devices and activity modification.  Onset of symptoms was gradual starting 4+ years ago with gradually worsening course since that time.The patient noted no past surgery on the right hip(s).  Patient currently rates pain in the right hip at 10 out of 10 with activity. Patient has night pain, worsening of pain with activity and weight bearing, trendelenberg gait, pain that interfers with activities of daily living and pain with passive range of motion. Patient has evidence of periarticular osteophytes and joint space narrowing by imaging studies. This condition presents safety issues increasing the risk of falls.   There is no current active infection.   Risks, benefits and expectations were discussed with the patient.  Risks including but not limited to the risk of anesthesia, blood clots, nerve damage, blood vessel damage, failure of the prosthesis, infection and up to and including death.  Patient understand the risks, benefits and expectations and wishes to proceed with surgery.   PCP: Wardell Honour, MD  D/C Plans:      Home  Post-op Meds:       No Rx given   Tranexamic Acid:      To be given - topically   (previous DVT)  Decadron:      is to be given  FYI:     Xarelto then ASA  Norco  Nicotine Patch 21 mg    Patient Active Problem List   Diagnosis Date Noted  . Recurrent left inguinal hernia 07/26/2014  . Chronic right hip pain 06/23/2014  . S/P bilateral inguinal hernia repair 10/30/2013  . Nodule of left lung 05/29/2013  . Bilateral inguinal hernia  05/28/2013  . Umbilical hernia [id number]  . Major psychotic depression, recurrent ([place]) 12/16/2012  . Inadequate anticoagulation 04/29/2012  . Hypocalcemia 07/28/2010  . CAD (coronary artery disease)   . HLD (hyperlipidemia)   . COPD (chronic obstructive pulmonary disease) ([place])   . Tobacco abuse   . Depression    Past Medical History  Diagnosis Date  . CAD (coronary artery disease)     a. INF STEMI 07/04/10:  tx with thrombectomy + [facility] to [facility];  cath 07/04/10: dLM 10-20%, pLAD 40-50%, mLAD 20-30%, pRCA 30%, mRCA occluded and tx with PCI, EF 50% with inf HK. A Multilink  . HLD (hyperlipidemia)   . COPD (chronic obstructive pulmonary disease) ([place])   . Tobacco abuse   . Bipolar disorder ([place])   . History of DVT (deep vein thrombosis)     traumatic, s/p coumadin tx.  . Glucose intolerance (impaired glucose tolerance)     A1c 6.2 06/2010  . Depression   . Myocardial infarction ([place])   . Shortness of breath     walking distance or climbing stairs  . Anxiety   . Hypertension     pt states is currently on no medications  . Urinary frequency   . Arthritis     Past Surgical History  Procedure Laterality Date  . Electrocardiogram      Showed inferolateral ST-elevation and a code STEMI was activated. In the ER, he was  treated with morphine, herarin, and 600 mg of Plavix. He was transferred emergently to [facility].   . Stent placement    . Vasectomy    . Laparoscopic inguinal hernia with umbilical hernia Bilateral 10/30/2013    Procedure: laparoscopic repair left pantaloom hernia with mesh, laparoscopic right inguinal hernia with mesh, OPEN REPAIR OF UMBILICAL HERNIA ;  Surgeon: Gayland Curry, MD;  Location: [facility];  Service: General;  Laterality: Bilateral;  . Insertion of mesh Bilateral 10/30/2013    Procedure: INSERTION OF MESH;  Surgeon: Gayland Curry, MD;  Location: [facility];  Service: General;  Laterality: Bilateral;  . Cardiac catheterization    . Inguinal hernia  repair Left 07/26/2014    Procedure: OPEN REPAIR OF RECURENT LEFT INGUINAL HERNIA REPAIR WITH MESH;  Surgeon: Greer Pickerel, MD;  Location: [facility];  Service: General;  Laterality: Left;  . Laparoscopy N/A 07/26/2014    Procedure: LAPAROSCOPY DIAGNOSTIC;  Surgeon: Greer Pickerel, MD;  Location: [facility];  Service: General;  Laterality: N/A;    No prescriptions prior to admission   Allergies  Allergen Reactions  . Flomax [Tamsulosin Hcl]     This medication makes patient feel sick  . Penicillins Other (See Comments)    unknown reaction    Social History  Substance Use Topics  . Smoking status: Current Every Day Smoker -- 2.00 packs/day for 45 years    Types: Cigarettes  . Smokeless tobacco: Never Used  . Alcohol Use: No     Comment: past hx of ETOH use was in inpt facility per court order  - 20 years    Family History  Problem Relation Age of Onset  . Coronary artery disease Father      Review of Systems  Constitutional: Negative.   HENT: Negative.   Eyes: Negative.   Respiratory: Positive for shortness of breath (with exertion).   Cardiovascular: Negative.   Gastrointestinal: Positive for constipation.  Genitourinary: Positive for frequency.  Musculoskeletal: Positive for joint pain.  Skin: Negative.   Neurological: Negative.   Endo/Heme/Allergies: Negative.   Psychiatric/Behavioral: Positive for depression. The patient is nervous/anxious.     Objective:  Physical Exam  Constitutional: He is oriented to person, place, and time. He appears well-developed.  HENT:  Head: Normocephalic.  Eyes: Pupils are equal, round, and reactive to light.  Neck: Neck supple. No JVD present. No tracheal deviation present. No thyromegaly present.  Cardiovascular: Normal rate, regular rhythm, normal heart sounds and intact distal pulses.   Respiratory: Effort normal and breath sounds normal. No stridor. No respiratory distress. He has no wheezes.  GI: Soft. There is no tenderness. There is no  guarding.  Musculoskeletal:       Right hip: He exhibits decreased range of motion, decreased strength, tenderness and bony tenderness. He exhibits no swelling, no deformity and no laceration.  Lymphadenopathy:    He has no cervical adenopathy.  Neurological: He is alert and oriented to person, place, and time.  Skin: Skin is warm and dry.  Psychiatric: He has a normal mood and affect.     Labs:  Estimated body mass index is 30.41 kg/(m^2) as calculated from the following:   Height as of 03/01/15: 5\' 9"  (1.753 m).   Weight as of 03/01/15: 93.441 kg (206 lb).   Imaging Review Plain radiographs demonstrate severe degenerative joint disease of the right hip(s). The bone quality appears to be good for age and reported activity level.  Assessment/Plan:  End stage arthritis,  right hip(s)  The patient history, physical examination, clinical judgement of the provider and imaging studies are consistent with end stage degenerative joint disease of the right hip(s) and total hip arthroplasty is deemed medically necessary. The treatment options including medical management, injection therapy, arthroscopy and arthroplasty were discussed at length. The risks and benefits of total hip arthroplasty were presented and reviewed. The risks due to aseptic loosening, infection, stiffness, dislocation/subluxation,  thromboembolic complications and other imponderables were discussed.  The patient acknowledged the explanation, agreed to proceed with the plan and consent was signed. Patient is being admitted for inpatient treatment for surgery, pain control, PT, OT, prophylactic antibiotics, VTE prophylaxis, progressive ambulation and ADL's and discharge planning.The patient is planning to be discharged home with home health services.    [place] Pugh Byron Tipping   PA-C  04/28/2015, 12:19 PM

## 2015-05-02 ENCOUNTER — Other Ambulatory Visit: Payer: Self-pay | Admitting: Family Medicine

## 2015-05-05 MED ORDER — TRAMADOL HCL 50 MG PO TABS: ORAL_TABLET | ORAL | Status: DC

## 2015-05-06 ENCOUNTER — Encounter (HOSPITAL_COMMUNITY): Payer: Self-pay

## 2015-05-06 ENCOUNTER — Encounter (HOSPITAL_COMMUNITY)
Admission: RE | Admit: 2015-05-06 | Discharge: 2015-05-06 | Disposition: A | Discharge status: Home / Self Care | Payer: Medicare Other | Source: Ambulatory Visit | Attending: Orthopedic Surgery | Admitting: Orthopedic Surgery

## 2015-05-06 DIAGNOSIS — E785 Hyperlipidemia, unspecified: Secondary | ICD-10-CM | POA: Diagnosis not present

## 2015-05-06 DIAGNOSIS — J449 Chronic obstructive pulmonary disease, unspecified: Secondary | ICD-10-CM | POA: Insufficient documentation

## 2015-05-06 DIAGNOSIS — Z01812 Encounter for preprocedural laboratory examination: Secondary | ICD-10-CM | POA: Diagnosis not present

## 2015-05-06 DIAGNOSIS — I252 Old myocardial infarction: Secondary | ICD-10-CM | POA: Insufficient documentation

## 2015-05-06 DIAGNOSIS — I2510 Atherosclerotic heart disease of native coronary artery without angina pectoris: Secondary | ICD-10-CM | POA: Diagnosis not present

## 2015-05-06 DIAGNOSIS — F319 Bipolar disorder, unspecified: Secondary | ICD-10-CM | POA: Diagnosis not present

## 2015-05-06 DIAGNOSIS — Z86718 Personal history of other venous thrombosis and embolism: Secondary | ICD-10-CM | POA: Diagnosis not present

## 2015-05-06 LAB — URINALYSIS, ROUTINE W REFLEX MICROSCOPIC
BILIRUBIN URINE: NEGATIVE
GLUCOSE, UA: 100 mg/dL — AB
HGB URINE DIPSTICK: NEGATIVE
KETONES UR: NEGATIVE mg/dL
Leukocytes, UA: NEGATIVE
NITRITE: NEGATIVE
PH: 6 (ref 5.0–8.0)
Protein, ur: NEGATIVE mg/dL
Specific Gravity, Urine: 1.016 (ref 1.005–1.030)

## 2015-05-06 LAB — BASIC METABOLIC PANEL
Anion gap: 11 (ref 5–15)
BUN: 11 mg/dL (ref 6–20)
CO2: 23 mmol/L (ref 22–32)
Calcium: 9.1 mg/dL (ref 8.9–10.3)
Chloride: 103 mmol/L (ref 101–111)
Creatinine, Ser: 0.91 mg/dL (ref 0.61–1.24)
GFR calc Af Amer: >60 mL/min (ref >60)
GLUCOSE: 131 mg/dL — AB (ref 65–99)
POTASSIUM: 4.5 mmol/L (ref 3.5–5.1)
Sodium: 137 mmol/L (ref 135–145)

## 2015-05-06 LAB — CBC
HEMATOCRIT: 51 % (ref 39.0–52.0)
Hemoglobin: 17.6 g/dL — ABNORMAL HIGH (ref 13.0–17.0)
MCH: 30.6 pg (ref 26.0–34.0)
MCHC: 34.5 g/dL (ref 30.0–36.0)
MCV: 88.5 fL (ref 78.0–100.0)
Platelets: 215 10*3/uL (ref 150–400)
RBC: 5.76 MIL/uL (ref 4.22–5.81)
RDW: 12.6 % (ref 11.5–15.5)
WBC: 10.4 10*3/uL (ref 4.0–10.5)

## 2015-05-06 LAB — APTT: APTT: 28 s (ref 24–37)

## 2015-05-06 LAB — ABO/RH: ABO/RH(D): B POS

## 2015-05-06 LAB — SURGICAL PCR SCREEN
MRSA, PCR: NEGATIVE
Staphylococcus aureus: NEGATIVE

## 2015-05-06 LAB — PROTIME-INR
INR: 0.93 (ref 0.00–1.49)
PROTHROMBIN TIME: 12.7 s (ref 11.6–15.2)

## 2015-05-06 NOTE — Pre-Procedure Instructions (Addendum)
EKG 03-19-14 epic CT chest 11-11-14 epic  Pt chart is marked for history of cocaine and heroin. However, pt denies.  Ordered Stat drug screen for AM of surgery.  Pt had MI in June 2012.  Spoke with Dr. Aurea Graff office. She is looking to see if they have cardiac clearance.    Pt does not have cardiac clearance. Spoke with Dr. Marcie Bal, anesthesia.  He would like clarification that pt's CAD is stable prior to surgery.  Called Dr. Sabra Heck, PCP to get clarification.  Dr. Sabra Heck remains okay to clear for surgery. CAD is stable.

## 2015-05-06 NOTE — Patient Instructions (Signed)
[name]  05/06/2015   Your procedure is scheduled on: 05/10/15  Report to [facility]  Entrance take [facility]  elevators to 3rd floor to  [place] at 5:15AM.  Call this number if you have problems the morning of surgery [phone]   Remember: ONLY 1 PERSON MAY GO WITH YOU TO SHORT STAY TO GET  READY MORNING OF [place].  Do not eat food or drink liquids :After Midnight.     Take these medicines the morning of surgery with A SIP OF WATER: none                                You may not have any metal on your body including hair pins and              piercings  Do not wear jewelry, make-up, lotions, powders or perfumes, deodorant             Do not wear nail polish.  Do not shave  48 hours prior to surgery.              Men may shave face and neck.   Do not bring valuables to the hospital. [place].  Contacts, dentures or bridgework may not be worn into surgery.  Leave suitcase in the car. After surgery it may be brought to your room.                Please read over the following fact sheets you were given: _____________________________________________________________________             [facility] - Preparing for Surgery Before surgery, you can play an important role.  Because skin is not sterile, your skin needs to be as free of germs as possible.  You can reduce the number of germs on your skin by washing with CHG (chlorahexidine gluconate) soap before surgery.  CHG is an antiseptic cleaner which kills germs and bonds with the skin to continue killing germs even after washing. Please DO NOT use if you have an allergy to CHG or antibacterial soaps.  If your skin becomes reddened/irritated stop using the CHG and inform your nurse when you arrive at Short Stay. Do not shave (including legs and underarms) for at least 48 hours prior to the first CHG shower.  You may shave your  face/neck. Please follow these instructions carefully:  1.  Shower with CHG Soap the night before surgery and the  morning of Surgery.  2.  If you choose to wash your hair, wash your hair first as usual with your  normal  shampoo.  3.  After you shampoo, rinse your hair and body thoroughly to remove the  shampoo.                           4.  Use CHG as you would any other liquid soap.  You can apply chg directly  to the skin and wash                       Gently with a scrungie or clean washcloth.  5.  Apply the CHG Soap to your  body ONLY FROM THE NECK DOWN.   Do not use on face/ open                           Wound or open sores. Avoid contact with eyes, ears mouth and genitals (private parts).                       Wash face,  Genitals (private parts) with your normal soap.             6.  Wash thoroughly, paying special attention to the area where your surgery  will be performed.  7.  Thoroughly rinse your body with warm water from the neck down.  8.  DO NOT shower/wash with your normal soap after using and rinsing off  the CHG Soap.                9.  Pat yourself dry with a clean towel.            10.  Wear clean pajamas.            11.  Place clean sheets on your bed the night of your first shower and do not  sleep with pets. Day of Surgery : Do not apply any lotions/deodorants the morning of surgery.  Please wear clean clothes to the hospital/surgery center.  FAILURE TO FOLLOW THESE INSTRUCTIONS MAY RESULT IN THE CANCELLATION OF YOUR SURGERY PATIENT SIGNATURE_________________________________  NURSE SIGNATURE__________________________________  ________________________________________________________________________   Adam Phenix  An incentive spirometer is a tool that can help keep your lungs clear and active. This tool measures how well you are filling your lungs with each breath. Taking long deep breaths may help reverse or decrease the chance of developing breathing  (pulmonary) problems (especially infection) following:  A long period of time when you are unable to move or be active. BEFORE THE PROCEDURE   If the spirometer includes an indicator to show your best effort, your nurse or respiratory therapist will set it to a desired goal.  If possible, sit up straight or lean slightly forward. Try not to slouch.  Hold the incentive spirometer in an upright position. INSTRUCTIONS FOR USE   Sit on the edge of your bed if possible, or sit up as far as you can in bed or on a chair.  Hold the incentive spirometer in an upright position.  Breathe out normally.  Place the mouthpiece in your mouth and seal your lips tightly around it.  Breathe in slowly and as deeply as possible, raising the piston or the ball toward the top of the column.  Hold your breath for 3-5 seconds or for as long as possible. Allow the piston or ball to fall to the bottom of the column.  Remove the mouthpiece from your mouth and breathe out normally.  Rest for a few seconds and repeat Steps 1 through 7 at least 10 times every 1-2 hours when you are awake. Take your time and take a few normal breaths between deep breaths.  The spirometer may include an indicator to show your best effort. Use the indicator as a goal to work toward during each repetition.  After each set of 10 deep breaths, practice coughing to be sure your lungs are clear. If you have an incision (the cut made at the time of surgery), support your incision when coughing by placing a pillow or rolled up towels firmly against it.  Once you are able to get out of bed, walk around indoors and cough well. You may stop using the incentive spirometer when instructed by your caregiver.  RISKS AND COMPLICATIONS  Take your time so you do not get dizzy or light-headed.  If you are in pain, you may need to take or ask for pain medication before doing incentive spirometry. It is harder to take a deep breath if you are having  pain. AFTER USE  Rest and breathe slowly and easily.  It can be helpful to keep track of a log of your progress. Your caregiver can provide you with a simple table to help with this. If you are using the spirometer at home, follow these instructions: [place] IF:   You are having difficultly using the spirometer.  You have trouble using the spirometer as often as instructed.  Your pain medication is not giving enough relief while using the spirometer.  You develop fever of 100.5 F (38.1 C) or higher. SEEK IMMEDIATE MEDICAL CARE IF:   You cough up bloody sputum that had not been present before.  You develop fever of 102 F (38.9 C) or greater.  You develop worsening pain at or near the incision site. MAKE SURE YOU:   Understand these instructions.  Will watch your condition.  Will get help right away if you are not doing well or get worse. Document Released: 05/07/2006 Document Revised: 03/19/2011 Document Reviewed: 07/08/2006 Exit[employer] Patient Information 2014 [employer], [place].   ________________________________________________________________________  WHAT IS A BLOOD TRANSFUSION? Blood Transfusion Information  A transfusion is the replacement of blood or some of its parts. Blood is made up of multiple cells which provide different functions.  Red blood cells carry oxygen and are used for blood loss replacement.  White blood cells fight against infection.  Platelets control bleeding.  Plasma helps clot blood.  Other blood products are available for specialized needs, such as hemophilia or other clotting disorders. BEFORE THE TRANSFUSION  Who gives blood for transfusions?   Healthy volunteers who are fully evaluated to make sure their blood is safe. This is blood bank blood. Transfusion therapy is the safest it has ever been in the practice of medicine. Before blood is taken from a donor, a complete history is taken to make sure that person has no history  of diseases nor engages in risky social behavior (examples are intravenous drug use or sexual activity with multiple partners). The donor's travel history is screened to minimize risk of transmitting infections, such as malaria. The donated blood is tested for signs of infectious diseases, such as HIV and hepatitis. The blood is then tested to be sure it is compatible with you in order to minimize the chance of a transfusion reaction. If you or a relative donates blood, this is often done in anticipation of surgery and is not appropriate for emergency situations. It takes many days to process the donated blood. RISKS AND COMPLICATIONS Although transfusion therapy is very safe and saves many lives, the main dangers of transfusion include:   Getting an infectious disease.  Developing a transfusion reaction. This is an allergic reaction to something in the blood you were given. Every precaution is taken to prevent this. The decision to have a blood transfusion has been considered carefully by your caregiver before blood is given. Blood is not given unless the benefits outweigh the risks. AFTER THE TRANSFUSION  Right after receiving a blood transfusion, you will usually feel much better and more energetic.  This is especially true if your red blood cells have gotten low (anemic). The transfusion raises the level of the red blood cells which carry oxygen, and this usually causes an energy increase.  The nurse administering the transfusion will monitor you carefully for complications. HOME CARE INSTRUCTIONS  No special instructions are needed after a transfusion. You may find your energy is better. Speak with your caregiver about any limitations on activity for underlying diseases you may have. SEEK MEDICAL CARE IF:   Your condition is not improving after your transfusion.  You develop redness or irritation at the intravenous (IV) site. SEEK IMMEDIATE MEDICAL CARE IF:  Any of the following symptoms  occur over the next 12 hours:  Shaking chills.  You have a temperature by mouth above 102 F (38.9 C), not controlled by medicine.  Chest, back, or muscle pain.  People around you feel you are not acting correctly or are confused.  Shortness of breath or difficulty breathing.  Dizziness and fainting.  You get a rash or develop hives.  You have a decrease in urine output.  Your urine turns a dark color or changes to pink, red, or brown. Any of the following symptoms occur over the next 10 days:  You have a temperature by mouth above 102 F (38.9 C), not controlled by medicine.  Shortness of breath.  Weakness after normal activity.  The white part of the eye turns yellow (jaundice).  You have a decrease in the amount of urine or are urinating less often.  Your urine turns a dark color or changes to pink, red, or brown. Document Released: 12/23/1999 Document Revised: 03/19/2011 Document Reviewed: 08/11/2007 [facility] Patient Information 2014 [place], [place].  _______________________________________________________________________

## 2015-05-06 NOTE — Telephone Encounter (Signed)
Aware ,script ready. 

## 2015-05-09 NOTE — Anesthesia Preprocedure Evaluation (Addendum)
Anesthesia Evaluation  Patient identified by MRN, date of birth, ID band Patient awake    Reviewed: Allergy & Precautions, H&P , NPO status , Patient's Chart, lab work & pertinent test results  Airway Mallampati: II  TM Distance: >3 FB Neck ROM: full    Dental  (+) Dental Advisory Given, Edentulous Upper, Edentulous Lower   Pulmonary shortness of breath and with exertion, COPD, Current Smoker,    Pulmonary exam normal breath sounds clear to auscultation       Cardiovascular hypertension, + CAD, + Past MI and + Cardiac Stents  Normal cardiovascular exam Rhythm:regular Rate:Normal     Neuro/Psych Anxiety Depression Bipolar Disorder negative neurological ROS  negative psych ROS   GI/Hepatic negative GI ROS, Neg liver ROS,   Endo/Other  negative endocrine ROSImpaired glucose tolerance  Renal/GU negative Renal ROS  negative genitourinary   Musculoskeletal   Abdominal   Peds  Hematology negative hematology ROS (+)   Anesthesia Other Findings   Reproductive/Obstetrics negative OB ROS                           Anesthesia Physical Anesthesia Plan  ASA: III  Anesthesia Plan:    Post-op Pain Management:    Induction:   Airway Management Planned:   Additional Equipment:   Intra-op Plan:   Post-operative Plan:   Informed Consent: I have reviewed the patients History and Physical, chart, labs and discussed the procedure including the risks, benefits and alternatives for the proposed anesthesia with the patient or authorized representative who has indicated his/her understanding and acceptance.   Dental Advisory Given  Plan Discussed with: CRNA  Anesthesia Plan Comments:         Anesthesia Quick Evaluation

## 2015-05-10 ENCOUNTER — Inpatient Hospital Stay (HOSPITAL_COMMUNITY): Payer: Medicare Other | Admitting: Anesthesiology

## 2015-05-10 ENCOUNTER — Inpatient Hospital Stay (HOSPITAL_COMMUNITY): Payer: Medicare Other

## 2015-05-10 ENCOUNTER — Encounter (HOSPITAL_COMMUNITY): Payer: Self-pay

## 2015-05-10 ENCOUNTER — Encounter (HOSPITAL_COMMUNITY)
Admission: RE | Disposition: A | Discharge status: Home Health | Payer: Self-pay | Source: Ambulatory Visit | Attending: Orthopedic Surgery

## 2015-05-10 ENCOUNTER — Inpatient Hospital Stay (HOSPITAL_COMMUNITY)
Admission: RE | Admit: 2015-05-10 | Discharge: 2015-05-11 | DRG: 470 | Disposition: A | Discharge status: Home Health | Payer: Medicare Other | Source: Ambulatory Visit | Attending: Orthopedic Surgery | Admitting: Orthopedic Surgery

## 2015-05-10 DIAGNOSIS — F17210 Nicotine dependence, cigarettes, uncomplicated: Secondary | ICD-10-CM | POA: Diagnosis present

## 2015-05-10 DIAGNOSIS — Z86718 Personal history of other venous thrombosis and embolism: Secondary | ICD-10-CM | POA: Diagnosis not present

## 2015-05-10 DIAGNOSIS — I10 Essential (primary) hypertension: Secondary | ICD-10-CM | POA: Diagnosis not present

## 2015-05-10 DIAGNOSIS — J449 Chronic obstructive pulmonary disease, unspecified: Secondary | ICD-10-CM | POA: Diagnosis not present

## 2015-05-10 DIAGNOSIS — Z6829 Body mass index (BMI) 29.0-29.9, adult: Secondary | ICD-10-CM

## 2015-05-10 DIAGNOSIS — F319 Bipolar disorder, unspecified: Secondary | ICD-10-CM | POA: Diagnosis present

## 2015-05-10 DIAGNOSIS — E785 Hyperlipidemia, unspecified: Secondary | ICD-10-CM | POA: Diagnosis present

## 2015-05-10 DIAGNOSIS — Z96649 Presence of unspecified artificial hip joint: Secondary | ICD-10-CM

## 2015-05-10 DIAGNOSIS — I2510 Atherosclerotic heart disease of native coronary artery without angina pectoris: Secondary | ICD-10-CM | POA: Diagnosis present

## 2015-05-10 DIAGNOSIS — M1611 Unilateral primary osteoarthritis, right hip: Principal | ICD-10-CM | POA: Diagnosis present

## 2015-05-10 DIAGNOSIS — I252 Old myocardial infarction: Secondary | ICD-10-CM

## 2015-05-10 DIAGNOSIS — E663 Overweight: Secondary | ICD-10-CM | POA: Diagnosis present

## 2015-05-10 DIAGNOSIS — Z96641 Presence of right artificial hip joint: Secondary | ICD-10-CM | POA: Diagnosis not present

## 2015-05-10 DIAGNOSIS — Z471 Aftercare following joint replacement surgery: Secondary | ICD-10-CM | POA: Diagnosis not present

## 2015-05-10 DIAGNOSIS — M25551 Pain in right hip: Secondary | ICD-10-CM | POA: Diagnosis not present

## 2015-05-10 HISTORY — PX: TOTAL HIP ARTHROPLASTY: SHX124

## 2015-05-10 LAB — TYPE AND SCREEN
ABO/RH(D): B POS
ANTIBODY SCREEN: NEGATIVE

## 2015-05-10 LAB — RAPID URINE DRUG SCREEN, HOSP PERFORMED
Amphetamines: NOT DETECTED
BARBITURATES: NOT DETECTED
BENZODIAZEPINES: NOT DETECTED
Cocaine: NOT DETECTED
Opiates: NOT DETECTED
Tetrahydrocannabinol: NOT DETECTED

## 2015-05-10 SURGERY — ARTHROPLASTY, HIP, TOTAL, ANTERIOR APPROACH
Anesthesia: Spinal | Site: Hip | Laterality: Right

## 2015-05-10 MED ORDER — METOCLOPRAMIDE HCL 5 MG/ML IJ SOLN: 5.0000 mg | Freq: Three times a day (TID) | INTRAMUSCULAR | Status: DC | PRN

## 2015-05-10 MED ORDER — PHENYLEPHRINE HCL 10 MG/ML IJ SOLN
INTRAMUSCULAR | Status: DC | PRN
  Administered 2015-05-10 (×3): 80 ug via INTRAVENOUS

## 2015-05-10 MED ORDER — SODIUM CHLORIDE 0.9 % IR SOLN
Status: DC | PRN
  Administered 2015-05-10: 1000 mL

## 2015-05-10 MED ORDER — MAGNESIUM CITRATE PO SOLN: 1.0000 | Freq: Once | ORAL | Status: DC | PRN

## 2015-05-10 MED ORDER — DEXAMETHASONE SODIUM PHOSPHATE 10 MG/ML IJ SOLN
INTRAMUSCULAR | Status: AC
  Filled 2015-05-10: qty 1

## 2015-05-10 MED ORDER — MENTHOL 3 MG MT LOZG
1.0000 | LOZENGE | OROMUCOSAL | Status: DC | PRN
  Filled 2015-05-10: qty 9

## 2015-05-10 MED ORDER — CELECOXIB 200 MG PO CAPS
200.0000 mg | ORAL_CAPSULE | Freq: Two times a day (BID) | ORAL | Status: DC
  Administered 2015-05-10 – 2015-05-11 (×3): 200 mg via ORAL
  Filled 2015-05-10 (×5): qty 1

## 2015-05-10 MED ORDER — FENTANYL CITRATE (PF) 100 MCG/2ML IJ SOLN
INTRAMUSCULAR | Status: AC
  Filled 2015-05-10: qty 2

## 2015-05-10 MED ORDER — ONDANSETRON HCL 4 MG/2ML IJ SOLN: 4.0000 mg | Freq: Four times a day (QID) | INTRAMUSCULAR | Status: DC | PRN

## 2015-05-10 MED ORDER — VANCOMYCIN HCL IN DEXTROSE 1-5 GM/200ML-% IV SOLN
1000.0000 mg | Freq: Two times a day (BID) | INTRAVENOUS | Status: AC
  Administered 2015-05-10: 1000 mg via INTRAVENOUS
  Filled 2015-05-10: qty 200

## 2015-05-10 MED ORDER — HYDROMORPHONE HCL 1 MG/ML IJ SOLN: 0.2500 mg | INTRAMUSCULAR | Status: DC | PRN

## 2015-05-10 MED ORDER — BISACODYL 10 MG RE SUPP: 10.0000 mg | Freq: Every day | RECTAL | Status: DC | PRN

## 2015-05-10 MED ORDER — HYDROCODONE-ACETAMINOPHEN 7.5-325 MG PO TABS
1.0000 | ORAL_TABLET | ORAL | Status: DC
  Administered 2015-05-10: 1 via ORAL
  Administered 2015-05-10 – 2015-05-11 (×6): 2 via ORAL
  Filled 2015-05-10 (×4): qty 2
  Filled 2015-05-10: qty 1
  Filled 2015-05-10 (×3): qty 2

## 2015-05-10 MED ORDER — CHLORHEXIDINE GLUCONATE 4 % EX LIQD: 60.0000 mL | Freq: Once | CUTANEOUS | Status: DC

## 2015-05-10 MED ORDER — LACTATED RINGERS IV SOLN: INTRAVENOUS | Status: DC

## 2015-05-10 MED ORDER — ROCURONIUM BROMIDE 100 MG/10ML IV SOLN
INTRAVENOUS | Status: AC
  Filled 2015-05-10: qty 1

## 2015-05-10 MED ORDER — NICOTINE 21 MG/24HR TD PT24
21.0000 mg | MEDICATED_PATCH | TRANSDERMAL | Status: DC
  Administered 2015-05-10: 21 mg via TRANSDERMAL
  Filled 2015-05-10 (×2): qty 1

## 2015-05-10 MED ORDER — LACTATED RINGERS IV SOLN
INTRAVENOUS | Status: DC
  Administered 2015-05-10: 06:00:00 via INTRAVENOUS

## 2015-05-10 MED ORDER — BUPIVACAINE HCL (PF) 0.75 % IJ SOLN
INTRAMUSCULAR | Status: DC | PRN
  Administered 2015-05-10: 15 mg via INTRATHECAL

## 2015-05-10 MED ORDER — METHOCARBAMOL 1000 MG/10ML IJ SOLN
500.0000 mg | Freq: Four times a day (QID) | INTRAMUSCULAR | Status: DC | PRN
  Administered 2015-05-10: 500 mg via INTRAVENOUS
  Filled 2015-05-10: qty 5
  Filled 2015-05-10: qty 550

## 2015-05-10 MED ORDER — HYDROMORPHONE HCL 1 MG/ML IJ SOLN
0.5000 mg | INTRAMUSCULAR | Status: DC | PRN
  Administered 2015-05-10: 0.5 mg via INTRAVENOUS
  Filled 2015-05-10: qty 1

## 2015-05-10 MED ORDER — DEXAMETHASONE SODIUM PHOSPHATE 10 MG/ML IJ SOLN
10.0000 mg | Freq: Once | INTRAMUSCULAR | Status: AC
  Administered 2015-05-11: 10 mg via INTRAVENOUS

## 2015-05-10 MED ORDER — ONDANSETRON HCL 4 MG/2ML IJ SOLN
INTRAMUSCULAR | Status: DC | PRN
  Administered 2015-05-10: 4 mg via INTRAVENOUS

## 2015-05-10 MED ORDER — METHOCARBAMOL 500 MG PO TABS
500.0000 mg | ORAL_TABLET | Freq: Four times a day (QID) | ORAL | Status: DC | PRN
Start: 2015-05-10 — End: 2015-05-11
  Administered 2015-05-11: 500 mg via ORAL
  Filled 2015-05-10: qty 1

## 2015-05-10 MED ORDER — DOCUSATE SODIUM 100 MG PO CAPS
100.0000 mg | ORAL_CAPSULE | Freq: Two times a day (BID) | ORAL | Status: DC
  Administered 2015-05-10 – 2015-05-11 (×2): 100 mg via ORAL

## 2015-05-10 MED ORDER — MIDAZOLAM HCL 2 MG/2ML IJ SOLN
INTRAMUSCULAR | Status: DC | PRN
  Administered 2015-05-10: 2 mg via INTRAVENOUS

## 2015-05-10 MED ORDER — TRANEXAMIC ACID 1000 MG/10ML IV SOLN
2000.0000 mg | INTRAVENOUS | Status: DC | PRN
  Administered 2015-05-10: 2000 mg via TOPICAL

## 2015-05-10 MED ORDER — GLYCOPYRROLATE 0.2 MG/ML IJ SOLN
0.2000 mg | Freq: Once | INTRAMUSCULAR | Status: AC
  Administered 2015-05-10: 0.2 mg via INTRAVENOUS

## 2015-05-10 MED ORDER — DOXEPIN HCL 50 MG PO CAPS
50.0000 mg | ORAL_CAPSULE | Freq: Every evening | ORAL | Status: DC | PRN
  Filled 2015-05-10: qty 1

## 2015-05-10 MED ORDER — ONDANSETRON HCL 4 MG/2ML IJ SOLN
INTRAMUSCULAR | Status: AC
Start: 2015-05-10 — End: 2015-05-10
  Filled 2015-05-10: qty 2

## 2015-05-10 MED ORDER — PROPOFOL 10 MG/ML IV BOLUS
INTRAVENOUS | Status: AC
  Filled 2015-05-10: qty 80

## 2015-05-10 MED ORDER — LACTATED RINGERS IV BOLUS (SEPSIS)
500.0000 mL | Freq: Once | INTRAVENOUS | Status: AC
  Administered 2015-05-10: 500 mL via INTRAVENOUS

## 2015-05-10 MED ORDER — METOCLOPRAMIDE HCL 10 MG PO TABS: 5.0000 mg | ORAL_TABLET | Freq: Three times a day (TID) | ORAL | Status: DC | PRN

## 2015-05-10 MED ORDER — PHENOL 1.4 % MT LIQD: 1.0000 | OROMUCOSAL | Status: DC | PRN

## 2015-05-10 MED ORDER — RIVAROXABAN 10 MG PO TABS
10.0000 mg | ORAL_TABLET | ORAL | Status: DC
  Administered 2015-05-11: 10 mg via ORAL
  Filled 2015-05-10 (×2): qty 1

## 2015-05-10 MED ORDER — GLYCOPYRROLATE 0.2 MG/ML IJ SOLN: 0.1000 mg | Freq: Once | INTRAMUSCULAR | Status: DC

## 2015-05-10 MED ORDER — PHENYLEPHRINE HCL 10 MG/ML IJ SOLN
INTRAMUSCULAR | Status: AC
  Filled 2015-05-10: qty 2

## 2015-05-10 MED ORDER — DEXAMETHASONE SODIUM PHOSPHATE 10 MG/ML IJ SOLN
10.0000 mg | Freq: Once | INTRAMUSCULAR | Status: AC
  Administered 2015-05-10: 10 mg via INTRAVENOUS

## 2015-05-10 MED ORDER — DEXTROSE 5 % IV SOLN
20.0000 mg | INTRAVENOUS | Status: DC | PRN
  Administered 2015-05-10: 25 ug/min via INTRAVENOUS

## 2015-05-10 MED ORDER — FERROUS SULFATE 325 (65 FE) MG PO TABS
325.0000 mg | ORAL_TABLET | Freq: Three times a day (TID) | ORAL | Status: DC
  Administered 2015-05-10 – 2015-05-11 (×2): 325 mg via ORAL
  Filled 2015-05-10 (×5): qty 1

## 2015-05-10 MED ORDER — DIPHENHYDRAMINE HCL 25 MG PO CAPS
25.0000 mg | ORAL_CAPSULE | Freq: Four times a day (QID) | ORAL | Status: DC | PRN
  Administered 2015-05-11: 25 mg via ORAL
  Filled 2015-05-10: qty 1

## 2015-05-10 MED ORDER — PHENYLEPHRINE 40 MCG/ML (10ML) SYRINGE FOR IV PUSH (FOR BLOOD PRESSURE SUPPORT)
PREFILLED_SYRINGE | INTRAVENOUS | Status: AC
  Filled 2015-05-10: qty 10

## 2015-05-10 MED ORDER — PROPOFOL 500 MG/50ML IV EMUL
INTRAVENOUS | Status: DC | PRN
  Administered 2015-05-10: 75 ug/kg/min via INTRAVENOUS

## 2015-05-10 MED ORDER — MIDAZOLAM HCL 2 MG/2ML IJ SOLN
INTRAMUSCULAR | Status: AC
Start: 2015-05-10 — End: 2015-05-10
  Filled 2015-05-10: qty 2

## 2015-05-10 MED ORDER — POLYETHYLENE GLYCOL 3350 17 G PO PACK
17.0000 g | PACK | Freq: Two times a day (BID) | ORAL | Status: DC
  Administered 2015-05-10 – 2015-05-11 (×2): 17 g via ORAL

## 2015-05-10 MED ORDER — VANCOMYCIN HCL IN DEXTROSE 1-5 GM/200ML-% IV SOLN
1000.0000 mg | INTRAVENOUS | Status: AC
  Administered 2015-05-10 (×2): 1000 mg via INTRAVENOUS
  Filled 2015-05-10: qty 200

## 2015-05-10 MED ORDER — ONDANSETRON HCL 4 MG PO TABS: 4.0000 mg | ORAL_TABLET | Freq: Four times a day (QID) | ORAL | Status: DC | PRN

## 2015-05-10 MED ORDER — TRANEXAMIC ACID 1000 MG/10ML IV SOLN
2000.0000 mg | Freq: Once | INTRAVENOUS | Status: DC
  Filled 2015-05-10: qty 20

## 2015-05-10 MED ORDER — ALUM & MAG HYDROXIDE-SIMETH 200-200-20 MG/5ML PO SUSP
30.0000 mL | ORAL | Status: DC | PRN
  Administered 2015-05-11 (×3): 30 mL via ORAL
  Filled 2015-05-10 (×3): qty 30

## 2015-05-10 MED ORDER — SODIUM CHLORIDE 0.9 % IV SOLN
INTRAVENOUS | Status: DC
  Administered 2015-05-10: 1000 mL via INTRAVENOUS
  Administered 2015-05-11: 01:00:00 via INTRAVENOUS

## 2015-05-10 SURGICAL SUPPLY — 37 items
BAG DECANTER FOR FLEXI CONT (MISCELLANEOUS) IMPLANT
BAG SPEC THK2 15X12 ZIP CLS (MISCELLANEOUS)
BAG ZIPLOCK 12X15 (MISCELLANEOUS) IMPLANT
CAPT HIP TOTAL 2 ×2 IMPLANT
CLOTH BEACON ORANGE TIMEOUT ST (SAFETY) ×3 IMPLANT
COVER PERINEAL POST (MISCELLANEOUS) ×3 IMPLANT
DRAPE STERI IOBAN 125X83 (DRAPES) ×3 IMPLANT
DRAPE U-SHAPE 47X51 STRL (DRAPES) ×6 IMPLANT
DRESSING AQUACEL AG SP 3.5X10 (GAUZE/BANDAGES/DRESSINGS) ×1 IMPLANT
DRSG AQUACEL AG ADV 3.5X10 (GAUZE/BANDAGES/DRESSINGS) ×3 IMPLANT
DRSG AQUACEL AG SP 3.5X10 (GAUZE/BANDAGES/DRESSINGS) ×3
DURAPREP 26ML APPLICATOR (WOUND CARE) ×3 IMPLANT
ELECT REM PT RETURN 15FT ADLT (MISCELLANEOUS) IMPLANT
ELECT REM PT RETURN 9FT ADLT (ELECTROSURGICAL) ×3
ELECTRODE REM PT RTRN 9FT ADLT (ELECTROSURGICAL) ×1 IMPLANT
GLOVE BIOGEL M 7.0 STRL (GLOVE) IMPLANT
GLOVE BIOGEL PI IND STRL 7.5 (GLOVE) ×1 IMPLANT
GLOVE BIOGEL PI IND STRL 8.5 (GLOVE) ×4 IMPLANT
GLOVE BIOGEL PI INDICATOR 7.5 (GLOVE) ×2
GLOVE BIOGEL PI INDICATOR 8.5 (GLOVE) ×8
GLOVE ECLIPSE 8.0 STRL XLNG CF (GLOVE) ×12 IMPLANT
GLOVE ORTHO TXT STRL SZ7.5 (GLOVE) ×5 IMPLANT
GOWN STRL REUS W/TWL LRG LVL3 (GOWN DISPOSABLE) ×9 IMPLANT
GOWN STRL REUS W/TWL XL LVL3 (GOWN DISPOSABLE) ×3 IMPLANT
HOLDER FOLEY CATH W/STRAP (MISCELLANEOUS) ×3 IMPLANT
LIQUID BAND (GAUZE/BANDAGES/DRESSINGS) ×3 IMPLANT
PACK ANTERIOR HIP CUSTOM (KITS) ×3 IMPLANT
SAW OSC TIP CART 19.5X105X1.3 (SAW) ×3 IMPLANT
SUT MNCRL AB 4-0 PS2 18 (SUTURE) ×3 IMPLANT
SUT VIC AB 1 CT1 36 (SUTURE) ×9 IMPLANT
SUT VIC AB 2-0 CT1 27 (SUTURE) ×6
SUT VIC AB 2-0 CT1 TAPERPNT 27 (SUTURE) ×2 IMPLANT
SUT VLOC 180 0 24IN GS25 (SUTURE) ×3 IMPLANT
TRAY FOLEY W/METER SILVER 14FR (SET/KITS/TRAYS/PACK) IMPLANT
TRAY FOLEY W/METER SILVER 16FR (SET/KITS/TRAYS/PACK) ×3 IMPLANT
WATER STERILE IRR 1500ML POUR (IV SOLUTION) ×3 IMPLANT
YANKAUER SUCT BULB TIP 10FT TU (MISCELLANEOUS) IMPLANT

## 2015-05-10 NOTE — Transfer of Care (Signed)
Immediate Anesthesia Transfer of Care Note  Patient: Anthony [name].  Procedure(s) Performed: Procedure(s): RIGHT TOTAL HIP ARTHROPLASTY ANTERIOR APPROACH (Right)  Patient Location: PACU  Anesthesia Type:Spinal  Level of Consciousness: sedated  Airway & Oxygen Therapy: Patient connected to face mask oxygen  Post-op Assessment: Post -op Vital signs reviewed and stable  Post vital signs: stable  Last Vitals:  Filed Vitals:   05/10/15 0545  BP: 114/77  Pulse: 84  Temp: 37 C  Resp: 18    Last Pain:  Filed Vitals:   05/10/15 0621  PainSc: 1          Complications: No apparent anesthesia complications

## 2015-05-10 NOTE — Progress Notes (Signed)
Utilization review completed.  

## 2015-05-10 NOTE — Anesthesia Postprocedure Evaluation (Signed)
Anesthesia Post Note  Patient: Anthony [name].  Procedure(s) Performed: Procedure(s) (LRB): RIGHT TOTAL HIP ARTHROPLASTY ANTERIOR APPROACH (Right)  Patient location during evaluation: PACU Anesthesia Type: General Level of consciousness: awake and alert Pain management: pain level controlled Vital Signs Assessment: post-procedure vital signs reviewed and stable Respiratory status: spontaneous breathing, nonlabored ventilation, respiratory function stable and patient connected to nasal cannula oxygen Cardiovascular status: blood pressure returned to baseline and stable Postop Assessment: no signs of nausea or vomiting Anesthetic complications: no    Last Vitals:  Filed Vitals:   05/10/15 1200 05/10/15 1223  BP: 89/64 98/65  Pulse: 65 69  Temp:  36.7 C  Resp: 21 20    Last Pain:  Filed Vitals:   05/10/15 1333  PainSc: 2                  Mahira Petras L

## 2015-05-10 NOTE — Interval H&P Note (Signed)
History and Physical Interval Note:  05/10/2015 7:11 AM  Anthony [name].  has presented today for surgery, with the diagnosis of RIGHT HIP OA  The various methods of treatment have been discussed with the patient and family. After consideration of risks, benefits and other options for treatment, the patient has consented to  Procedure(s): RIGHT TOTAL HIP ARTHROPLASTY ANTERIOR APPROACH (Right) as a surgical intervention .  The patient's history has been reviewed, patient examined, no change in status, stable for surgery.  I have reviewed the patient's chart and labs.  Questions were answered to the patient's satisfaction.     Mauri Pole

## 2015-05-10 NOTE — Evaluation (Signed)
Physical Therapy Evaluation Patient Details Name: Anthony [name]. MRN: [id number] DOB: 11-16-1950 Today's Date: 05/10/2015   History of Present Illness  Anthony [name]  Clinical Impression  The patient ambulated x 48' today. The patient will benefit from PT to addrss the problems listed in the note below to address the problems listed in the note below.    Follow Up Recommendations No PT follow up    Equipment Recommendations  Rolling walker with 5" wheels;3in1 (PT)    Recommendations for Other Services       Precautions / Restrictions Precautions Precautions: Fall      Mobility  Bed Mobility Overal bed mobility: Needs Assistance Bed Mobility: Supine to Sit     Supine to sit: Min assist     General bed mobility comments: assist with the  right leg, cues  for technique  Transfers Overall transfer level: Needs assistance Equipment used: Rolling walker (2 wheeled) Transfers: Sit to/from Stand Sit to Stand: Min assist         General transfer comment: cues for technique, right leg position  Ambulation/Gait Ambulation/Gait assistance: Min assist Ambulation Distance (Feet): 48 Feet Assistive device: Rolling walker (2 wheeled) Gait Pattern/deviations: Step-to pattern;Antalgic;Decreased step length - right;Decreased stance time - right     General Gait Details: cues for sequence and  position inside RW.  Stairs            Wheelchair Mobility    Modified Rankin (Stroke Patients Only)       Balance                                             Pertinent Vitals/Pain Pain Assessment: 0-10 Pain Score: 2  Pain Location: Anthony thigh Pain Descriptors / Indicators: Tightness Pain Intervention(s): Monitored during session;Premedicated before session;Repositioned;Ice applied    Home Living Family/patient expects to be discharged to:: Private residence Living Arrangements: Alone Available Help at Discharge: Family Type of Home: House Home  Access: Stairs to enter Entrance Stairs-Rails: Psychiatric nurse of Steps: 8 Home Layout: One level Home Equipment: Anthony [name] - single point Additional Comments: ex wife to assist    Prior Function Level of Independence: Independent with assistive device(s)               Hand Dominance        Extremity/Trunk Assessment   Upper Extremity Assessment: Defer to OT evaluation           Lower Extremity Assessment: RLE deficits/detail RLE Deficits / Details: able to advance the leg first    Cervical / Trunk Assessment: Normal  Communication   Communication: No difficulties  Cognition Arousal/Alertness: Awake/alert Behavior During Therapy: WFL for tasks assessed/performed Overall Cognitive Status: Within Functional Limits for tasks assessed                      General Comments      Exercises        Assessment/Plan    PT Assessment Patient needs continued PT services  PT Diagnosis Difficulty walking;Acute pain   PT Problem List Decreased range of motion;Decreased activity tolerance;Decreased mobility;Decreased knowledge of precautions;Decreased safety awareness;Decreased knowledge of use of DME;Decreased strength;Pain  PT Treatment Interventions DME instruction;Gait training;Stair training;Functional mobility training;Therapeutic activities;Therapeutic exercise;Patient/family education   PT Goals (Current goals can be found in the Care Plan section) Acute Rehab PT Goals Patient Stated  Goal: to walk without pain PT Goal Formulation: With patient/family Time For Goal Achievement: 05/13/15 Potential to Achieve Goals: Good    Frequency 7X/week   Barriers to discharge        Co-evaluation               End of Session Equipment Utilized During Treatment: Gait belt Activity Tolerance: Patient tolerated treatment well Patient left: in chair;with call bell/phone within reach;with chair alarm set Nurse Communication: Mobility status          Time: [id number] PT Time Calculation (min) (ACUTE ONLY): 15 min   Charges:   PT Evaluation $PT Eval Low Complexity: 1 Procedure     PT G CodesMarcelino [name] PT [id number]  05/10/2015, 5:04 PM
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
## 2015-05-10 NOTE — Op Note (Signed)
NAME:  Anthony [name].                ACCOUNT NO.: [id number]      MEDICAL RECORD NO.: [id number]      FACILITY:  [facility]      PHYSICIAN:  Paralee Cancel D  DATE OF BIRTH:  Dec 06, 1950     DATE OF PROCEDURE:  05/10/2015                                 OPERATIVE REPORT         PREOPERATIVE DIAGNOSIS: Right  hip osteoarthritis.      POSTOPERATIVE DIAGNOSIS:  Right hip osteoarthritis.      PROCEDURE:  Right total hip replacement through an anterior approach   utilizing DePuy THR system, component size 53mm pinnacle cup, a size 36+4 neutral   Altrex liner, a size 8 Hi Tri Lock stem with a 36+1.5 delta ceramic   ball.      SURGEON:  Pietro Cassis. Alvan Dame, M.D.      ASSISTANT:  Danae Orleans, PA-C     ANESTHESIA:  Spinal.      SPECIMENS:  None.      COMPLICATIONS:  None.      BLOOD LOSS:  325 cc     DRAINS:  None     INDICATION OF THE PROCEDURE:  Anthony [name]. is a 65 y.o. male who had   presented to office for evaluation of right hip pain.  Radiographs revealed   progressive degenerative changes with bone-on-bone   articulation to the  hip joint.  The patient had painful limited range of   motion significantly affecting their overall quality of life.  The patient was failing to    respond to conservative measures, and at this point was ready   to proceed with more definitive measures.  The patient has noted progressive   degenerative changes in his hip, progressive problems and dysfunction   with regarding the hip prior to surgery.  Consent was obtained for   benefit of pain relief.  Specific risk of infection, DVT, component   failure, dislocation, need for revision surgery, as well discussion of   the anterior versus posterior approach were reviewed.  Consent was   obtained for benefit of anterior pain relief through an anterior   approach.      PROCEDURE IN DETAIL:  The patient was brought to operative theater.   Once adequate anesthesia,  preoperative antibiotics, 1gm of Vancomycin and 10 mg of Decadron administered.   The patient was positioned supine on the OSI Hanna table.  Once adequate   padding of boney process was carried out, we had predraped out the hip, and  used fluoroscopy to confirm orientation of the pelvis and position.      The right hip was then prepped and draped from proximal iliac crest to   mid thigh with shower curtain technique.      Time-out was performed identifying the patient, planned procedure, and   extremity.     An incision was then made 2 cm distal and lateral to the   anterior superior iliac spine extending over the orientation of the   tensor fascia lata muscle and sharp dissection was carried down to the   fascia of the muscle and protractor placed in the soft tissues.      The fascia was then incised.  The  muscle belly was identified and swept   laterally and retractor placed along the superior neck.  Following   cauterization of the circumflex vessels and removing some pericapsular   fat, a second cobra retractor was placed on the inferior neck.  A third   retractor was placed on the anterior acetabulum after elevating the   anterior rectus.  A L-capsulotomy was along the line of the   superior neck to the trochanteric fossa, then extended proximally and   distally.  Tag sutures were placed and the retractors were then placed   intracapsular.  We then identified the trochanteric fossa and   orientation of my neck cut, confirmed this radiographically   and then made a neck osteotomy with the femur on traction.  The femoral   head was removed without difficulty or complication.  Traction was let   off and retractors were placed posterior and anterior around the   acetabulum.      The labrum and foveal tissue were debrided.  I began reaming with a 5mm   reamer and reamed up to 14mm reamer with good bony bed preparation and a 4mm   cup was chosen.  Cancellous bone graft from the  femoral head was placed in the central acetabular region where central pulvinar removed.  The final 34mm Pinnacle cup was then impacted under fluoroscopy  to confirm the depth of penetration and orientation with respect to   abduction.  A screw was placed followed by the hole eliminator.  The final   36+4 neutral Altrex liner was impacted with good visualized rim fit.  The cup was positioned anatomically within the acetabular portion of the pelvis.      At this point, the femur was rolled at 80 degrees.  Further capsule was   released off the inferior aspect of the femoral neck.  I then   released the superior capsule proximally.  The hook was placed laterally   along the femur and elevated manually and held in position with the bed   hook.  The leg was then extended and adducted with the leg rolled to 100   degrees of external rotation.  Once the proximal femur was fully   exposed, I used a box osteotome to set orientation.  I then began   broaching with the starting chili pepper broach and passed this by hand and then broached up to 8.  With the 8 broach in place I chose a high offset neck and did trial reductions.  The offset was appropriate, leg lengths   appeared to be equal best matched with the +1.5 trial head ball confirmed radiographically.   Given these findings, I went ahead and dislocated the hip, repositioned all   retractors and positioned the right hip in the extended and abducted position.  The final 8 Hi Tri Lock stem was   chosen and it was impacted down to the level of neck cut.  Based on this   and the trial reduction, a 36+1.5 delta ceramic ball was chosen and   impacted onto a clean and dry trunnion, and the hip was reduced.  The   hip had been irrigated throughout the case again at this point.  I did   reapproximate the superior capsular leaflet to the anterior leaflet   using #1 Vicryl.  The fascia of the   tensor fascia lata muscle was then reapproximated using #1  Vicryl and #0 V-lock sutures.  The   remaining wound was closed  with 2-0 Vicryl and running 4-0 Monocryl.   The hip was cleaned, dried, and dressed sterilely using Dermabond and   Aquacel dressing.  He was then brought   to recovery room in stable condition tolerating the procedure well.    Danae Orleans, PA-C was present for the entirety of the case involved from   preoperative positioning, perioperative retractor management, general   facilitation of the case, as well as primary wound closure as assistant.            Pietro Cassis Alvan Dame, M.D.        05/10/2015 8:48 AM

## 2015-05-10 NOTE — Anesthesia Procedure Notes (Addendum)
Date/Time: 05/10/2015 7:20 AM Performed by: Pilar Grammes Pre-anesthesia Checklist: Patient identified Patient Re-evaluated:Patient Re-evaluated prior to inductionOxygen Delivery Method: Simple face mask   Spinal Patient location during procedure: OR Start time: 05/10/2015 7:24 AM End time: 05/10/2015 7:29 AM Staffing Anesthesiologist: Rod Mae Performed by: anesthesiologist  Preanesthetic Checklist Completed: patient identified, site marked, surgical consent, pre-op evaluation, timeout performed, IV checked, risks and benefits discussed and monitors and equipment checked Spinal Block Patient position: sitting Prep: Betadine Patient monitoring: heart rate, continuous pulse ox and blood pressure Location: L3-4 Injection technique: single-shot Needle Needle type: Whitacre  Needle gauge: 25 G Needle length: 9 cm Assessment Sensory level: T6 Additional Notes Expiration date of kit checked and confirmed. Patient tolerated procedure well, without complications.

## 2015-05-11 DIAGNOSIS — E663 Overweight: Secondary | ICD-10-CM | POA: Diagnosis present

## 2015-05-11 LAB — BASIC METABOLIC PANEL
ANION GAP: 8 (ref 5–15)
BUN: 14 mg/dL (ref 6–20)
CO2: 25 mmol/L (ref 22–32)
Calcium: 8.6 mg/dL — ABNORMAL LOW (ref 8.9–10.3)
Chloride: 106 mmol/L (ref 101–111)
Creatinine, Ser: 0.94 mg/dL (ref 0.61–1.24)
Glucose, Bld: 154 mg/dL — ABNORMAL HIGH (ref 65–99)
Potassium: 4.4 mmol/L (ref 3.5–5.1)
Sodium: 139 mmol/L (ref 135–145)

## 2015-05-11 LAB — CBC
HCT: 42.3 % (ref 39.0–52.0)
Hemoglobin: 14.5 g/dL (ref 13.0–17.0)
MCH: 30 pg (ref 26.0–34.0)
MCHC: 34.3 g/dL (ref 30.0–36.0)
MCV: 87.6 fL (ref 78.0–100.0)
PLATELETS: 207 10*3/uL (ref 150–400)
RBC: 4.83 MIL/uL (ref 4.22–5.81)
RDW: 12.6 % (ref 11.5–15.5)
WBC: 17.4 10*3/uL — AB (ref 4.0–10.5)

## 2015-05-11 MED ORDER — HYDROCODONE-ACETAMINOPHEN 7.5-325 MG PO TABS: 1.0000 | ORAL_TABLET | ORAL | Status: DC | PRN

## 2015-05-11 MED ORDER — RIVAROXABAN 10 MG PO TABS: 10.0000 mg | ORAL_TABLET | ORAL | Status: DC

## 2015-05-11 MED ORDER — FERROUS SULFATE 325 (65 FE) MG PO TABS: 325.0000 mg | ORAL_TABLET | Freq: Three times a day (TID) | ORAL | Status: DC

## 2015-05-11 MED ORDER — DOCUSATE SODIUM 100 MG PO CAPS: 100.0000 mg | ORAL_CAPSULE | Freq: Two times a day (BID) | ORAL | Status: DC

## 2015-05-11 MED ORDER — ASPIRIN EC 325 MG PO TBEC: 325.0000 mg | DELAYED_RELEASE_TABLET | Freq: Two times a day (BID) | ORAL | Status: AC

## 2015-05-11 MED ORDER — TIZANIDINE HCL 4 MG PO TABS: 4.0000 mg | ORAL_TABLET | Freq: Four times a day (QID) | ORAL | Status: DC | PRN

## 2015-05-11 MED ORDER — POLYETHYLENE GLYCOL 3350 17 G PO PACK: 17.0000 g | PACK | Freq: Two times a day (BID) | ORAL | Status: DC

## 2015-05-11 NOTE — Care Management Note (Signed)
Case Management Note  Patient Details  Name: Anthony [name]. MRN: [id number] Date of Birth: 1950/04/23  Subjective/Objective:                  RIGHT TOTAL HIP ARTHROPLASTY ANTERIOR APPROACH (Right) Action/Plan: Discharge planning Expected Discharge Date:  05/11/15               Expected Discharge Plan:  Home/Self Care  In-House Referral:     Discharge planning Services  CM Consult  Post Acute Care Choice:    Choice offered to:  Patient  DME Arranged:  3-N-1, Walker rolling DME Agency:  [place]:  NA [place] Agency:  NA  Status of Service:  Completed, signed off  Medicare Important Message Given:    Date Medicare IM Given:    Medicare IM give by:    Date Additional Medicare IM Given:    Additional Medicare Important Message give by:     If discussed at [place] of Stay Meetings, dates discussed:    Additional Comments: CM met with pt to confirm plan to go home without [facility] services; pt confirms.  CM called [facility] DME rep, Lecretia to please deliver the 3n1 and rolling walker to room so pt can discharge.  No other CM needs were communicated. Dellie Catholic, RN 05/11/2015, 9:48 AM

## 2015-05-11 NOTE — Progress Notes (Signed)
[facility]    [facility] is providing the following services: RW/Commode  If patient discharges after hours, please call [phone].   Anthony [name] 05/11/2015, 10:14 AM

## 2015-05-11 NOTE — Discharge Instructions (Addendum)
INSTRUCTIONS AFTER JOINT REPLACEMENT  ° °o Remove items at home which could result in a fall. This includes throw rugs or furniture in walking pathways °o ICE to the affected joint every three hours while awake for 30 minutes at a time, for at least the first 3-5 days, and then as needed for pain and swelling.  Continue to use ice for pain and swelling. You may notice swelling that will progress down to the foot and ankle.  This is normal after surgery.  Elevate your leg when you are not up walking on it.   °o Continue to use the breathing machine you got in the hospital (incentive spirometer) which will help keep your temperature down.  It is common for your temperature to cycle up and down following surgery, especially at night when you are not up moving around and exerting yourself.  The breathing machine keeps your lungs expanded and your temperature down. ° ° °DIET:  As you were doing prior to hospitalization, we recommend a well-balanced diet. ° °DRESSING / WOUND CARE / SHOWERING ° °Keep the surgical dressing until follow up.  The dressing is water proof, so you can shower without any extra covering.  IF THE DRESSING FALLS OFF or the wound gets wet inside, change the dressing with sterile gauze.  Please use good hand washing techniques before changing the dressing.  Do not use any lotions or creams on the incision until instructed by your surgeon.   ° °ACTIVITY ° °o Increase activity slowly as tolerated, but follow the weight bearing instructions below.   °o No driving for 6 weeks or until further direction given by your physician.  You cannot drive while taking narcotics.  °o No lifting or carrying greater than 10 lbs. until further directed by your surgeon. °o Avoid periods of inactivity such as sitting longer than an hour when not asleep. This helps prevent blood clots.  °o You may return to work once you are authorized by your doctor.  ° ° ° °WEIGHT BEARING  ° °Weight bearing as tolerated with assist  device (walker, cane, etc) as directed, use it as long as suggested by your surgeon or therapist, typically at least 4-6 weeks. ° ° °EXERCISES ° °Results after joint replacement surgery are often greatly improved when you follow the exercise, range of motion and muscle strengthening exercises prescribed by your doctor. Safety measures are also important to protect the joint from further injury. Any time any of these exercises cause you to have increased pain or swelling, decrease what you are doing until you are comfortable again and then slowly increase them. If you have problems or questions, call your caregiver or physical therapist for advice.  ° °Rehabilitation is important following a joint replacement. After just a few days of immobilization, the muscles of the leg can become weakened and shrink (atrophy).  These exercises are designed to build up the tone and strength of the thigh and leg muscles and to improve motion. Often times heat used for twenty to thirty minutes before working out will loosen up your tissues and help with improving the range of motion but do not use heat for the first two weeks following surgery (sometimes heat can increase post-operative swelling).  ° °These exercises can be done on a training (exercise) mat, on the floor, on a table or on a bed. Use whatever works the best and is most comfortable for you.    Use music or television while you are exercising so that   the exercises are a pleasant break in your day. This will make your life better with the exercises acting as a break in your routine that you can look forward to.   Perform all exercises about fifteen times, three times per day or as directed.  You should exercise both the operative leg and the other leg as well. ° °Exercises include: °  °• Quad Sets - Tighten up the muscle on the front of the thigh (Quad) and hold for 5-10 seconds.   °• Straight Leg Raises - With your knee straight (if you were given a brace, keep it on),  lift the leg to 60 degrees, hold for 3 seconds, and slowly lower the leg.  Perform this exercise against resistance later as your leg gets stronger.  °• Leg Slides: Lying on your back, slowly slide your foot toward your buttocks, bending your knee up off the floor (only go as far as is comfortable). Then slowly slide your foot back down until your leg is flat on the floor again.  °• Angel Wings: Lying on your back spread your legs to the side as far apart as you can without causing discomfort.  °• Hamstring Strength:  Lying on your back, push your heel against the floor with your leg straight by tightening up the muscles of your buttocks.  Repeat, but this time bend your knee to a comfortable angle, and push your heel against the floor.  You may put a pillow under the heel to make it more comfortable if necessary.  ° °A rehabilitation program following joint replacement surgery can speed recovery and prevent re-injury in the future due to weakened muscles. Contact your doctor or a physical therapist for more information on knee rehabilitation.  ° ° °CONSTIPATION ° °Constipation is defined medically as fewer than three stools per week and severe constipation as less than one stool per week.  Even if you have a regular bowel pattern at home, your normal regimen is likely to be disrupted due to multiple reasons following surgery.  Combination of anesthesia, postoperative narcotics, change in appetite and fluid intake all can affect your bowels.  ° °YOU MUST use at least one of the following options; they are listed in order of increasing strength to get the job done.  They are all available over the counter, and you may need to use some, POSSIBLY even all of these options:   ° °Drink plenty of fluids (prune juice may be helpful) and high fiber foods °Colace 100 mg by mouth twice a day  °Senokot for constipation as directed and as needed Dulcolax (bisacodyl), take with full glass of water  °Miralax (polyethylene glycol)  once or twice a day as needed. ° °If you have tried all these things and are unable to have a bowel movement in the first 3-4 days after surgery call either your surgeon or your primary doctor.   ° °If you experience loose stools or diarrhea, hold the medications until you stool forms back up.  If your symptoms do not get better within 1 week or if they get worse, check with your doctor.  If you experience "the worst abdominal pain ever" or develop nausea or vomiting, please contact the office immediately for further recommendations for treatment. ° ° °ITCHING:  If you experience itching with your medications, try taking only a single pain pill, or even half a pain pill at a time.  You can also use Benadryl over the counter for itching or also to   help with sleep.   TED HOSE STOCKINGS:  Use stockings on both legs until for at least 2 weeks or as directed by physician office. They may be removed at night for sleeping.  MEDICATIONS:  See your medication summary on the After Visit Summary that nursing will review with you.  You may have some home medications which will be placed on hold until you complete the course of blood thinner medication.  It is important for you to complete the blood thinner medication as prescribed.  PRECAUTIONS:  If you experience chest pain or shortness of breath - call 911 immediately for transfer to the hospital emergency department.   If you develop a fever greater that 101 F, purulent drainage from wound, increased redness or drainage from wound, foul odor from the wound/dressing, or calf pain - CONTACT YOUR SURGEON.                                                   FOLLOW-UP APPOINTMENTS:  If you do not already have a post-op appointment, please call the office for an appointment to be seen by your surgeon.  Guidelines for how soon to be seen are listed in your After Visit Summary, but are typically between 1-4 weeks after surgery.  OTHER INSTRUCTIONS:   Knee  Replacement:  Do not place pillow under knee, focus on keeping the knee straight while resting.   MAKE SURE YOU:   Understand these instructions.   Get help right away if you are not doing well or get worse.    Thank you for letting us be a part of your medical care team.  It is a privilege we respect greatly.  We hope these instructions will help you stay on track for a fast and full recovery!     Information on my medicine - XARELTO (Rivaroxaban)  This medication education was reviewed with me or my healthcare representative as part of my discharge preparation.  The pharmacist that spoke with me during my hospital stay was:  Yonael Tulloch, [facility]  Why was Xarelto prescribed for you? Xarelto was prescribed for you to reduce the risk of blood clots forming after orthopedic surgery. The medical term for these abnormal blood clots is venous thromboembolism (VTE).  What do you need to know about xarelto ? Take your Xarelto ONCE DAILY at the same time every day. You may take it either with or without food.  If you have difficulty swallowing the tablet whole, you may crush it and mix in applesauce just prior to taking your dose.  Take Xarelto exactly as prescribed by your doctor and DO NOT stop taking Xarelto without talking to the doctor who prescribed the medication.  Stopping without other VTE prevention medication to take the place of Xarelto may increase your risk of developing a clot.  After discharge, you should have regular check-up appointments with your healthcare provider that is prescribing your Xarelto.    What do you do if you miss a dose? If you miss a dose, take it as soon as you remember on the same day then continue your regularly scheduled once daily regimen the next day. Do not take two doses of Xarelto on the same day.   Important Safety Information A possible side effect of Xarelto is bleeding. You should call your healthcare provider right away if you  experience any of the following: ? Bleeding from an injury or your nose that does not stop. ? Unusual colored urine (red or dark brown) or unusual colored stools (red or black). ? Unusual bruising for unknown reasons. ? A serious fall or if you hit your head (even if there is no bleeding).  Some medicines may interact with Xarelto and might increase your risk of bleeding while on Xarelto. To help avoid this, consult your healthcare provider or pharmacist prior to using any new prescription or non-prescription medications, including herbals, vitamins, non-steroidal anti-inflammatory drugs (NSAIDs) and supplements.  This website has more information on Xarelto: [email or web address]

## 2015-05-11 NOTE — Progress Notes (Signed)
     Subjective: 1 Day Post-Op Procedure(s) (LRB): RIGHT TOTAL HIP ARTHROPLASTY ANTERIOR APPROACH (Right)   Patient reports pain as mild, pain controlled. No events throughout the night. Ready to be discharged home if he does well with PT.  Objective:   VITALS:   Filed Vitals:   05/11/15 0228 05/11/15 0624  BP: 107/65 114/73  Pulse: 62 63  Temp: 98 F (36.7 C) 97.8 F (36.6 C)  Resp: 18 18    Dorsiflexion/Plantar flexion intact Incision: dressing C/D/I No cellulitis present Compartment soft  LABS  Recent Labs  05/11/15 0425  HGB 14.5  HCT 42.3  WBC 17.4*  PLT 207     Recent Labs  05/11/15 0425  NA 139  K 4.4  BUN 14  CREATININE 0.94  GLUCOSE 154*     Assessment/Plan: 1 Day Post-Op Procedure(s) (LRB): RIGHT TOTAL HIP ARTHROPLASTY ANTERIOR APPROACH (Right) Foley cath d/c'ed Advance diet Up with therapy D/C IV fluids Discharge home with home health  Follow up in 2 weeks at [facility]. Follow up with OLIN,Angelamarie Avakian D in 2 weeks.  Contact information:  [facility] [address] [place] [id number]    Overweight (BMI 25-29.9) Estimated body mass index is 29.61 kg/(m^2) as calculated from the following:   Height as of this encounter: 5\' 10"  (1.778 m).   Weight as of this encounter: 93.611 kg (206 lb 6 oz). Patient also counseled that weight may inhibit the healing process Patient counseled that losing weight will help with future health issues         West Pugh. Makael Stein   PAC  05/11/2015, 9:53 AM

## 2015-05-11 NOTE — Progress Notes (Signed)
Physical Therapy Treatment Patient Details Name: Anthony [name]. MRN: [id number] DOB: 11-15-1950 Today's Date: 05/11/2015    History of Present Illness [name]    PT Comments    The patient is mobilizing at times without the RW, cues and reminders to stick with the RW, plans DC today.  Follow Up Recommendations  No PT follow up     Equipment Recommendations       Recommendations for Other Services       Precautions / Restrictions Precautions Precautions: Fall    Mobility  Bed Mobility               General bed mobility comments: oob  Transfers Overall transfer level: Modified independent                  Ambulation/Gait Ambulation/Gait assistance: Supervision Ambulation Distance (Feet): 225 Feet         General Gait Details: cues for sequence and  position inside RW.   Stairs            Wheelchair Mobility    Modified Rankin (Stroke Patients Only)       Balance                                    Cognition Arousal/Alertness: Awake/alert                          Exercises Total Joint Exercises Hip ABduction/ADduction: AROM;Right;10 reps;Standing Knee Flexion: AROM;Right;10 reps;Standing Marching in Standing: AROM;Right;10 reps;Standing Standing Hip Extension: AROM;Right;Standing    General Comments        Pertinent Vitals/Pain Pain Score: 4  Pain Descriptors / Indicators: Tender;Tightness Pain Intervention(s): Monitored during session;Premedicated before session;Repositioned    Home Living                      Prior Function            PT Goals (current goals can now be found in the care plan section) Progress towards PT goals: Progressing toward goals    Frequency       PT Plan Current plan remains appropriate    Co-evaluation             End of Session Equipment Utilized During Treatment: Gait belt Activity Tolerance: Patient tolerated treatment well Patient  left: in chair;with call bell/phone within reach     Time: 1500-1533 PT Time Calculation (min) (ACUTE ONLY): 33 min  Charges:  $Gait Training: 8-22 mins $Therapeutic Exercise: 8-22 mins                    G Codes:      Claretha Cooper 05/11/2015, 3:43 PM Tresa Endo PT [phone]

## 2015-05-11 NOTE — Progress Notes (Signed)
Physical Therapy Treatment Patient Details Name: Anthony [name]. MRN: [id number] DOB: 08-03-1950 Today's Date: 05/11/2015    History of Present Illness [name]    PT Comments    Patient is progressing well. Plans DC today.   Follow Up Recommendations  No PT follow up     Equipment Recommendations  Rolling walker with 5" wheels;3in1 (PT)    Recommendations for Other Services       Precautions / Restrictions Precautions Precautions: Fall Restrictions Weight Bearing Restrictions: No    Mobility  Bed Mobility Overal bed mobility: Modified Independent Bed Mobility: Supine to Sit     Supine to sit: Min assist     General bed mobility comments: assist with the  right leg, cues  for technique  Transfers Overall transfer level: Modified independent Equipment used: Rolling walker (2 wheeled) Transfers: Sit to/from Stand Sit to Stand: Modified independent (Device/Increase time)         General transfer comment: cues for UE/LE placement and safety  Ambulation/Gait Ambulation/Gait assistance: Supervision Ambulation Distance (Feet): 225 Feet Assistive device: Rolling walker (2 wheeled) Gait Pattern/deviations: Step-to pattern;Step-through pattern;Antalgic     General Gait Details: cues for sequence and  position inside RW.   Stairs Stairs: Yes Stairs assistance: Supervision Stair Management: One rail Right;One rail Left;Step to pattern;Forwards;With cane Number of Stairs: 4 General stair comments: cues for safety and sequence  Wheelchair Mobility    Modified Rankin (Stroke Patients Only)       Balance                                    Cognition Arousal/Alertness: Awake/alert Behavior During Therapy: WFL for tasks assessed/performed Overall Cognitive Status:  (decreased safety; pt is used to being independent)                      Exercises Total Joint Exercises Ankle Circles/Pumps: AROM;Both;10 reps;Supine Quad Sets:  AROM;Both;10 reps;Supine Short Arc Quad: AROM;Right;10 reps;Supine Heel Slides: AROM;Right;10 reps;Supine Hip ABduction/ADduction: AROM;Right;10 reps;Supine Straight Leg Raises: AROM;Right;10 reps;Supine Long Arc Quad: AROM;Right;10 reps;Seated    General Comments        Pertinent Vitals/Pain Pain Score: 4  Pain Location: R hip Pain Descriptors / Indicators: Tightness;Dull Pain Intervention(s): Monitored during session;Premedicated before session;Ice applied;Repositioned    Home Living Family/patient expects to be discharged to:: Private residence Living Arrangements: Alone Available Help at Discharge: Other (Comment) (ex wife)           Additional Comments: has reacher at home    Prior Function Level of Independence: Independent with assistive device(s)          PT Goals (current goals can now be found in the care plan section) Acute Rehab PT Goals Patient Stated Goal: to walk without pain Progress towards PT goals: Progressing toward goals    Frequency  7X/week    PT Plan Current plan remains appropriate    Co-evaluation             End of Session   Activity Tolerance: Patient tolerated treatment well Patient left: in bed;with call bell/phone within reach;with bed alarm set     Time: [id number] PT Time Calculation (min) (ACUTE ONLY): 36 min  Charges:  $Gait Training: 8-22 mins $Therapeutic Exercise: 8-22 mins                    G Codes:  Claretha Cooper 05/11/2015, 11:34 AM Tresa Endo PT [phone]

## 2015-05-11 NOTE — Addendum Note (Signed)
Addendum  created 05/11/15 1029 by Lollie Sails, CRNA   Modules edited: Charges VN

## 2015-05-11 NOTE — Evaluation (Signed)
Occupational Therapy Evaluation Patient Details Name: Anthony [name]. MRN: [id number] DOB: 1950-07-02 Today's Date: 05/11/2015    History of Present Illness Anthony [name]   Clinical Impression   This 65 year old man was admitted for the above surgery. He will benefit from continued OT in acute to reinforce safety and use of reacher for ADLs.  Pt is very independent natured and lives at home alone, but his ex wife will assist him initially  Goals are for supervision     Follow Up Recommendations  Supervision/Assistance - 24 hour    Equipment Recommendations  3 in 1 bedside comode    Recommendations for Other Services       Precautions / Restrictions Precautions Precautions: Fall Restrictions Weight Bearing Restrictions: No      Mobility Bed Mobility   Bed Mobility: Supine to Sit     Supine to sit: Min assist     General bed mobility comments: assist with the  right leg, cues  for technique  Transfers   Equipment used: Rolling walker (2 wheeled) Transfers: Sit to/from Stand Sit to Stand: Min guard         General transfer comment: cues for UE/LE placement and safety    Balance                                            ADL Overall ADL's : Needs assistance/impaired     Grooming: Min guard;Standing       Lower Body Bathing: Minimal assistance;Sit to/from stand       Lower Body Dressing: Moderate assistance;Sit to/from stand   Toilet Transfer: Min guard;Ambulation;BSC;RW             General ADL Comments: pt required multiple safety cues.  Stood without walker when scooting to EOB.  "parked walker a couple of times and attempted to step out of it and adjust things in room (ie moving BSC, adjusting blanket in chair).  Educated to stay within walker and to use the walker at all times when standing.  Pt lives alone and is very independent.  Discussed methods to assist RLE out of bed.       Vision     Perception     Praxis       Pertinent Vitals/Pain Pain Score: 2  Pain Location: Anthony thigh Pain Descriptors / Indicators: Sore Pain Intervention(s): Limited activity within patient's tolerance;Monitored during session;Premedicated before session;Repositioned;Ice applied     Hand Dominance     Extremity/Trunk Assessment Upper Extremity Assessment Upper Extremity Assessment: Overall WFL for tasks assessed           Communication Communication Communication: No difficulties   Cognition Arousal/Alertness: Awake/alert Behavior During Therapy: WFL for tasks assessed/performed Overall Cognitive Status:  (decreased safety; pt is used to being independent)                     General Comments       Exercises       Shoulder Instructions      Home Living Family/patient expects to be discharged to:: Private residence Living Arrangements: Alone Available Help at Discharge: Other (Comment) (ex wife)               Bathroom Shower/Tub: Tub/shower unit Shower/tub characteristics: Curtain Biochemist, clinical: Standard         Additional Comments: has Secondary school teacher at  home      Prior Functioning/Environment Level of Independence: Independent with assistive device(s)             OT Diagnosis: Acute pain   OT Problem List: Decreased safety awareness;Pain;Decreased knowledge of use of DME or AE   OT Treatment/Interventions: Self-care/ADL training;DME and/or AE instruction;Patient/family education (safety)    OT Goals(Current goals can be found in the care plan section) Acute Rehab OT Goals Patient Stated Goal: to walk without pain OT Goal Formulation: With patient Time For Goal Achievement: 05/18/15 Potential to Achieve Goals: Good ADL Goals Pt Will Perform Lower Body Dressing: with supervision;with adaptive equipment;sitting/lateral leans (pants) Pt Will Transfer to Toilet: with supervision;ambulating;bedside commode Additional ADL Goal #1: pt will keep body positioned within walker when  standing and walking during adls  OT Frequency: Min 2X/week   Barriers to D/C:            Co-evaluation              End of Session    Activity Tolerance: Patient tolerated treatment well Patient left: in chair;with call bell/phone within reach;with chair alarm set   Time: [id number] OT Time Calculation (min): 20 min Charges:  OT General Charges $OT Visit: 1 Procedure OT Evaluation $OT Eval Low Complexity: 1 Procedure G-Codes:    Nuchem Grattan 05/13/15, 9:21 AM Lesle Chris, OTR/L [phone] 05/13/15

## 2015-05-16 NOTE — Discharge Summary (Signed)
Physician Discharge Summary  Patient ID: Anthony [name]. MRN: [id number] DOB/AGE: Aug 11, 1950 65 y.o.  Admit date: 05/10/2015 Discharge date: 05/11/2015   Procedures:  Procedure(s) (LRB): RIGHT TOTAL HIP ARTHROPLASTY ANTERIOR APPROACH (Right)  Attending Physician:  Dr. Paralee Cancel   Admission Diagnoses:   Right hip primary OA / pain  Discharge Diagnoses:  Principal Problem:   S/P right THA, AA Active Problems:   Overweight (BMI 25.0-29.9)  Past Medical History  Diagnosis Date  . CAD (coronary artery disease)     a. INF STEMI 07/04/10:  tx with thrombectomy + [facility] to [facility];  cath 07/04/10: dLM 10-20%, pLAD 40-50%, mLAD 20-30%, pRCA 30%, mRCA occluded and tx with PCI, EF 50% with inf HK. A Multilink  . HLD (hyperlipidemia)   . COPD (chronic obstructive pulmonary disease) ([place])   . Tobacco abuse   . Bipolar disorder ([place])   . History of DVT (deep vein thrombosis)     traumatic, s/p coumadin tx.  . Glucose intolerance (impaired glucose tolerance)     A1c 6.2 06/2010  . Depression   . Shortness of breath     walking distance or climbing stairs  . Anxiety   . Hypertension     pt states is currently on no medications  . Urinary frequency   . Arthritis   . Myocardial infarction [facility])     around 2013    HPI:    Anthony Form., 65 y.o. male, has a history of pain and functional disability in the right hip(s) due to arthritis and patient has failed non-surgical conservative treatments for greater than 12 weeks to include NSAID's and/or analgesics, use of assistive devices and activity modification. Onset of symptoms was gradual starting 4+ years ago with gradually worsening course since that time.The patient noted no past surgery on the right hip(s). Patient currently rates pain in the right hip at 10 out of 10 with activity. Patient has night pain, worsening of pain with activity and weight bearing, trendelenberg gait, pain that interfers with activities of daily living  and pain with passive range of motion. Patient has evidence of periarticular osteophytes and joint space narrowing by imaging studies. This condition presents safety issues increasing the risk of falls. There is no current active infection. Risks, benefits and expectations were discussed with the patient. Risks including but not limited to the risk of anesthesia, blood clots, nerve damage, blood vessel damage, failure of the prosthesis, infection and up to and including death. Patient understand the risks, benefits and expectations and wishes to proceed with surgery.   PCP: Wardell Honour, MD   Discharged Condition: good  Hospital Course:  Patient underwent the above stated procedure on 05/10/2015. Patient tolerated the procedure well and brought to the recovery room in good condition and subsequently to the floor.  POD #1 BP: 114/73 ; Pulse: 63 ; Temp: 97.8 F (36.6 C) ; Resp: 18 Patient reports pain as mild, pain controlled. No events throughout the night. Ready to be discharged home. Dorsiflexion/plantar flexion intact, incision: dressing C/D/I, no cellulitis present and compartment soft.   LABS  Basename    HGB     14.5  HCT     42.3    Discharge Exam: General appearance: alert, cooperative and no distress Extremities: Homans sign is negative, no sign of DVT, no edema, redness or tenderness in the calves or thighs and no ulcers, gangrene or trophic changes  Disposition: Home with follow up in 2 weeks  Follow-up Information    Follow up with Anthony Pole, MD. Schedule an appointment as soon as possible for a visit in 2 weeks.   Specialty:  Orthopedic Surgery   Contact information:   [address] [place] [address] [id number]       Discharge Instructions    Call MD / Call 911    Complete by:  As directed   If you experience chest pain or shortness of breath, CALL 911 and be transported to the hospital emergency room.  If you develope a fever  above 101 F, pus (white drainage) or increased drainage or redness at the wound, or calf pain, call your surgeon's office.     Change dressing    Complete by:  As directed   Maintain surgical dressing until follow up in the clinic. If the edges start to pull up, may reinforce with tape. If the dressing is no longer working, may remove and cover with gauze and tape, but must keep the area dry and clean.  Call with any questions or concerns.     Constipation Prevention    Complete by:  As directed   Drink plenty of fluids.  Prune juice may be helpful.  You may use a stool softener, such as Colace (over the counter) 100 mg twice a day.  Use MiraLax (over the counter) for constipation as needed.     Diet - low sodium heart healthy    Complete by:  As directed      Discharge instructions    Complete by:  As directed   Maintain surgical dressing until follow up in the clinic. If the edges start to pull up, may reinforce with tape. If the dressing is no longer working, may remove and cover with gauze and tape, but must keep the area dry and clean.  Follow up in 2 weeks at [facility]. Call with any questions or concerns.     Increase activity slowly as tolerated    Complete by:  As directed   Weight bearing as tolerated with assist device (walker, cane, etc) as directed, use it as long as suggested by your surgeon or therapist, typically at least 4-6 weeks.     TED hose    Complete by:  As directed   Use stockings (TED hose) for 2 weeks on both leg(s).  You may remove them at night for sleeping.             Medication List    STOP taking these medications        acetaminophen 325 MG tablet  Commonly known as:  TYLENOL     buPROPion 100 MG tablet  Commonly known as:  WELLBUTRIN     fluticasone furoate-vilanterol 100-25 MCG/INH Aepb  Commonly known as:  BREO ELLIPTA     HYDROcodone-acetaminophen 5-325 MG tablet  Commonly known as:  NORCO/VICODIN  Replaced by:   HYDROcodone-acetaminophen 7.5-325 MG tablet     QUEtiapine 25 MG tablet  Commonly known as:  SEROQUEL     traMADol 50 MG tablet  Commonly known as:  ULTRAM      TAKE these medications        ANTIFUNGAL 1 % cream  Generic drug:  tolnaftate  Apply 1 application topically daily as needed (He uses on feet.).     ANTIFUNGAL SPRAY POWDER EX  Apply 1 application topically daily as needed (Applies to groin area.).     aspirin EC 325 MG tablet  Take 1  tablet (325 mg total) by mouth 2 (two) times daily. Take for 4 weeks.  Start taking on:  05/26/2015     docusate sodium 100 MG capsule  Commonly known as:  COLACE  Take 1 capsule (100 mg total) by mouth 2 (two) times daily.     doxepin 50 MG capsule  Commonly known as:  SINEQUAN  Take 1 capsule (50 mg total) by mouth at bedtime.     ferrous sulfate 325 (65 FE) MG tablet  Take 1 tablet (325 mg total) by mouth 3 (three) times daily after meals.     glucosamine-chondroitin 500-400 MG tablet  Take 1 tablet by mouth 2 (two) times daily.     HYDROcodone-acetaminophen 7.5-325 MG tablet  Commonly known as:  NORCO  Take 1-2 tablets by mouth every 4 (four) hours as needed for moderate pain.     LUTEIN PO  Take 1-2 tablets by mouth every morning.     MELATONIN PO  Take 2-3 tablets by mouth at bedtime as needed (For sleep.).     polyethylene glycol packet  Commonly known as:  MIRALAX / GLYCOLAX  Take 17 g by mouth 2 (two) times daily.     rivaroxaban 10 MG Tabs tablet  Commonly known as:  XARELTO  Take 1 tablet (10 mg total) by mouth daily.     senna 8.6 MG Tabs tablet  Commonly known as:  SENOKOT  Take 1 tablet by mouth 4 (four) times daily as needed for mild constipation.     testosterone cypionate 200 MG/ML injection  Commonly known as:  DEPO-TESTOSTERONE  Inject 1 mL (200 mg total) into the muscle every 28 (twenty-eight) days.     tetrahydrozoline 0.05 % ophthalmic solution  Place 2-3 drops into both eyes 4 (four) times  daily as needed (For eye redness.).     tiZANidine 4 MG tablet  Commonly known as:  ZANAFLEX  Take 1 tablet (4 mg total) by mouth every 6 (six) hours as needed for muscle spasms.     TRYPTOPHAN PO  Take 2-3 capsules by mouth at bedtime as needed (For sleep.).         Signed: West Pugh. Markee Remlinger   PA-C  05/16/2015, 2:55 PM

## 2015-05-19 ENCOUNTER — Encounter (HOSPITAL_COMMUNITY): Payer: Self-pay | Admitting: Orthopedic Surgery

## 2015-05-26 ENCOUNTER — Ambulatory Visit: Payer: Medicare Other

## 2015-05-26 DIAGNOSIS — Z96641 Presence of right artificial hip joint: Secondary | ICD-10-CM | POA: Diagnosis not present

## 2015-05-26 DIAGNOSIS — Z471 Aftercare following joint replacement surgery: Secondary | ICD-10-CM | POA: Diagnosis not present

## 2015-05-30 ENCOUNTER — Ambulatory Visit (INDEPENDENT_AMBULATORY_CARE_PROVIDER_SITE_OTHER): Payer: Medicare Other | Admitting: *Deleted

## 2015-05-30 DIAGNOSIS — E291 Testicular hypofunction: Secondary | ICD-10-CM

## 2015-05-30 DIAGNOSIS — E349 Endocrine disorder, unspecified: Secondary | ICD-10-CM

## 2015-05-30 MED ORDER — TESTOSTERONE CYPIONATE 200 MG/ML IM SOLN
200.0000 mg | INTRAMUSCULAR | Status: AC
  Administered 2015-05-30 – 2015-10-25 (×4): 200 mg via INTRAMUSCULAR

## 2015-05-30 NOTE — Patient Instructions (Signed)

## 2015-05-30 NOTE — Progress Notes (Signed)
Pt given Testosterone injection IM LUOQ and tolerated well. ?

## 2015-06-14 ENCOUNTER — Encounter: Payer: Self-pay | Admitting: Cardiovascular Disease

## 2015-06-14 ENCOUNTER — Telehealth: Payer: Self-pay | Admitting: Cardiovascular Disease

## 2015-06-14 NOTE — Progress Notes (Signed)
No show

## 2015-06-14 NOTE — Telephone Encounter (Signed)
No show for appt today. cdm 

## 2015-06-15 ENCOUNTER — Encounter: Payer: Self-pay | Admitting: Cardiovascular Disease

## 2015-06-23 DIAGNOSIS — Z96641 Presence of right artificial hip joint: Secondary | ICD-10-CM | POA: Diagnosis not present

## 2015-06-23 DIAGNOSIS — Z471 Aftercare following joint replacement surgery: Secondary | ICD-10-CM | POA: Diagnosis not present

## 2015-07-01 ENCOUNTER — Ambulatory Visit (INDEPENDENT_AMBULATORY_CARE_PROVIDER_SITE_OTHER): Payer: Medicare Other

## 2015-07-01 DIAGNOSIS — E291 Testicular hypofunction: Secondary | ICD-10-CM

## 2015-07-01 DIAGNOSIS — E349 Endocrine disorder, unspecified: Secondary | ICD-10-CM

## 2015-07-11 ENCOUNTER — Ambulatory Visit (INDEPENDENT_AMBULATORY_CARE_PROVIDER_SITE_OTHER): Payer: Medicare Other | Admitting: Family Medicine

## 2015-07-11 ENCOUNTER — Encounter: Payer: Self-pay | Admitting: Family Medicine

## 2015-07-11 VITALS — BP 123/80 | HR 79 | Temp 97.1°F | Ht 70.0 in | Wt 187.2 lb

## 2015-07-11 DIAGNOSIS — G8929 Other chronic pain: Secondary | ICD-10-CM | POA: Diagnosis not present

## 2015-07-11 DIAGNOSIS — M25551 Pain in right hip: Secondary | ICD-10-CM

## 2015-07-11 DIAGNOSIS — E291 Testicular hypofunction: Secondary | ICD-10-CM

## 2015-07-11 DIAGNOSIS — F329 Major depressive disorder, single episode, unspecified: Secondary | ICD-10-CM

## 2015-07-11 DIAGNOSIS — F32A Depression, unspecified: Secondary | ICD-10-CM

## 2015-07-11 DIAGNOSIS — E349 Endocrine disorder, unspecified: Secondary | ICD-10-CM

## 2015-07-11 MED ORDER — ALPRAZOLAM 0.5 MG PO TBDP: 0.5000 mg | ORAL_TABLET | Freq: Two times a day (BID) | ORAL | Status: DC | PRN

## 2015-07-11 MED ORDER — TRAMADOL HCL 50 MG PO TABS: ORAL_TABLET | ORAL | Status: DC

## 2015-07-11 NOTE — Progress Notes (Signed)
Subjective:    Patient ID: Anthony Form., male    DOB: 10/16/1950, 65 y.o.   MRN: [id number]  HPI Patient here today with complaints of trouble sleeping, difficulty concentrating, and anxiety. Patient had hip replacement back in May and is doing a lot better. Still taking tramadol when necessary for pain. He is also receiving testosterone injections for the past 6-7 months and he thinks this is helped in terms of energy and depression but he still does have some trouble sleeping and nervousness. He has taken alprazolam in the past which helps. We have also tried several other hypnotics at night without much success.          Patient Active Problem List   Diagnosis Date Noted  . Overweight (BMI 25.0-29.9) 05/11/2015  . S/P right THA, AA 05/10/2015  . Recurrent left inguinal hernia 07/26/2014  . Chronic right hip pain 06/23/2014  . S/P bilateral inguinal hernia repair 10/30/2013  . Nodule of left lung 05/29/2013  . Bilateral inguinal hernia 05/28/2013  . Umbilical hernia 123456  . Major psychotic depression, recurrent ([place]) 12/16/2012  . Inadequate anticoagulation 04/29/2012  . Hypocalcemia 07/28/2010  . CAD (coronary artery disease)   . HLD (hyperlipidemia)   . COPD (chronic obstructive pulmonary disease) ([place])   . Tobacco abuse   . Depression    Outpatient Encounter Prescriptions as of 07/11/2015  Medication Sig  . glucosamine-chondroitin 500-400 MG tablet Take 1 tablet by mouth 2 (two) times daily.   . LUTEIN PO Take 1-2 tablets by mouth every morning.  Marland Kitchen MELATONIN PO Take 2-3 tablets by mouth at bedtime as needed (For sleep.).  Marland Kitchen testosterone cypionate (DEPO-TESTOSTERONE) 200 MG/ML injection Inject 1 mL (200 mg total) into the muscle every 28 (twenty-eight) days.  . Tolnaftate (ANTIFUNGAL SPRAY POWDER EX) Apply 1 application topically daily as needed (Applies to groin area.).  Marland Kitchen tolnaftate (ANTIFUNGAL) 1 % cream Apply 1 application topically daily as needed (He  uses on feet.).  Marland Kitchen ferrous sulfate 325 (65 FE) MG tablet Take 1 tablet (325 mg total) by mouth 3 (three) times daily after meals. (Patient not taking: Reported on 07/11/2015)  . polyethylene glycol (MIRALAX / GLYCOLAX) packet Take 17 g by mouth 2 (two) times daily. (Patient not taking: Reported on 07/11/2015)  . rivaroxaban (XARELTO) 10 MG TABS tablet Take 1 tablet (10 mg total) by mouth daily.  Marland Kitchen tetrahydrozoline 0.05 % ophthalmic solution Place 2-3 drops into both eyes 4 (four) times daily as needed (For eye redness.).  Marland Kitchen tiZANidine (ZANAFLEX) 4 MG tablet Take 1 tablet (4 mg total) by mouth every 6 (six) hours as needed for muscle spasms. (Patient not taking: Reported on 07/11/2015)  . [DISCONTINUED] docusate sodium (COLACE) 100 MG capsule Take 1 capsule (100 mg total) by mouth 2 (two) times daily. (Patient not taking: Reported on 07/11/2015)  . [DISCONTINUED] doxepin (SINEQUAN) 50 MG capsule Take 1 capsule (50 mg total) by mouth at bedtime. (Patient not taking: Reported on 07/11/2015)  . [DISCONTINUED] HYDROcodone-acetaminophen (NORCO) 7.5-325 MG tablet Take 1-2 tablets by mouth every 4 (four) hours as needed for moderate pain. (Patient not taking: Reported on 07/11/2015)  . [DISCONTINUED] senna (SENOKOT) 8.6 MG TABS tablet Take 1 tablet by mouth 4 (four) times daily as needed for mild constipation.  . [DISCONTINUED] TRYPTOPHAN PO Take 2-3 capsules by mouth at bedtime as needed (For sleep.). Reported on 07/11/2015   Facility-Administered Encounter Medications as of 07/11/2015  Medication  . testosterone cypionate (DEPOTESTOSTERONE CYPIONATE) injection 200  mg     Review of Systems  Constitutional: Negative.   HENT: Negative.   Eyes: Negative.   Respiratory: Negative.   Cardiovascular: Negative.   Gastrointestinal: Negative.   Endocrine: Negative.   Genitourinary: Negative.   Musculoskeletal: Negative.   Skin: Negative.   Allergic/Immunologic: Negative.   Neurological: Negative.   Hematological:  Negative.   Psychiatric/Behavioral: Positive for sleep disturbance and decreased concentration. The patient is nervous/anxious.        Objective:   Physical Exam  Constitutional: He is oriented to person, place, and time. He appears well-developed and well-nourished.  Patient has lost weight eating healthier but has gone back to smoking about 2 packs per day  Cardiovascular: Normal rate.   Pulmonary/Chest: Effort normal and breath sounds normal.  Musculoskeletal:  Walks with a cane but moves around pretty well. He did not undergo any formal physical therapy after the hip replacement  Neurological: He is alert and oriented to person, place, and time.  Psychiatric: He has a normal mood and affect. His behavior is normal.    BP 123/80 mmHg  Pulse 79  Temp(Src) 97.1 F (36.2 C) (Oral)  Ht 5\' 10"  (1.778 m)  Wt 187 lb 3.2 oz (84.913 kg)  BMI 26.86 kg/m2         Assessment & Plan:  1. Testosterone deficiency Receiving injections. Need to reassess testosterone level - Testosterone,Free and Total  2. Depression Will try alprazolam rather than a typical antidepressant. Really not sure whether his depression is more of an anxiety issue  3. Chronic right hip pain Antrum's are markedly better after joint replacement  Wardell Honour MD

## 2015-07-13 LAB — TESTOSTERONE,FREE AND TOTAL
TESTOSTERONE: 523 ng/dL (ref 348–1197)
Testosterone, Free: 12.8 pg/mL (ref 6.6–18.1)

## 2015-08-02 ENCOUNTER — Ambulatory Visit (INDEPENDENT_AMBULATORY_CARE_PROVIDER_SITE_OTHER): Payer: Medicare Other | Admitting: *Deleted

## 2015-08-02 DIAGNOSIS — E349 Endocrine disorder, unspecified: Secondary | ICD-10-CM

## 2015-08-02 DIAGNOSIS — E291 Testicular hypofunction: Secondary | ICD-10-CM

## 2015-08-02 NOTE — Progress Notes (Signed)
Pt given Testosterone injection IM RUOQ and tolerated well. 

## 2015-08-03 DIAGNOSIS — Z96641 Presence of right artificial hip joint: Secondary | ICD-10-CM | POA: Diagnosis not present

## 2015-08-03 DIAGNOSIS — Z471 Aftercare following joint replacement surgery: Secondary | ICD-10-CM | POA: Diagnosis not present

## 2015-08-15 NOTE — Progress Notes (Signed)
 Subjective:    Patient ID: Anthony M Betty Jr., male    DOB: 01/10/1950, 65 y.o.   MRN: [id number]  HPI 65-year-old gentleman who complains of weakness and fatigue. He is receiving testosterone replacement shots that I thought was helping symptoms. His sleep hygiene is questionable he sleeps daytime and nighttime concerning the depression screening he scores very high at 19 but he thinks that depression may be because he feels weak. He is having some problems with memory and forgetfulness which could also be a sign of depression.  Patient Active Problem List   Diagnosis Date Noted  . Overweight (BMI 25.0-29.9) 05/11/2015  . S/P right THA, AA 05/10/2015  . Recurrent left inguinal hernia 07/26/2014  . Chronic right hip pain 06/23/2014  . S/P bilateral inguinal hernia repair 10/30/2013  . Nodule of left lung 05/29/2013  . Bilateral inguinal hernia 05/28/2013  . Umbilical hernia 05/28/2013  . Major psychotic depression, recurrent ([facility]) 12/16/2012  . Inadequate anticoagulation 04/29/2012  . Hypocalcemia 07/28/2010  . CAD (coronary artery disease)   . HLD (hyperlipidemia)   . COPD (chronic obstructive pulmonary disease) ([facility])   . Tobacco abuse   . Depression    Outpatient Encounter Prescriptions as of 08/16/2015  Medication Sig  . ALPRAZolam (NIRAVAM) 0.5 MG dissolvable tablet Take 1 tablet (0.5 mg total) by mouth 2 (two) times daily as needed for anxiety.  . ferrous sulfate 325 (65 FE) MG tablet Take 1 tablet (325 mg total) by mouth 3 (three) times daily after meals.  . glucosamine-chondroitin 500-400 MG tablet Take 1 tablet by mouth 2 (two) times daily.   . LUTEIN PO Take 1-2 tablets by mouth every morning.  . MELATONIN PO Take 2-3 tablets by mouth at bedtime as needed (For sleep.).  . testosterone cypionate (DEPO-TESTOSTERONE) 200 MG/ML injection Inject 1 mL (200 mg total) into the muscle every 28 (twenty-eight) days.  . tetrahydrozoline 0.05 % ophthalmic solution Place 2-3 drops into  both eyes 4 (four) times daily as needed (For eye redness.).  . Tolnaftate (ANTIFUNGAL SPRAY POWDER EX) Apply 1 application topically daily as needed (Applies to groin area.).  . tolnaftate (ANTIFUNGAL) 1 % cream Apply 1 application topically daily as needed (He uses on feet.).  . traMADol (ULTRAM) 50 MG tablet Take 1-2 tablets 3 times a day when necessary  . polyethylene glycol (MIRALAX / GLYCOLAX) packet Take 17 g by mouth 2 (two) times daily. (Patient not taking: Reported on 07/11/2015)  . [DISCONTINUED] rivaroxaban (XARELTO) 10 MG TABS tablet Take 1 tablet (10 mg total) by mouth daily.  . [DISCONTINUED] tiZANidine (ZANAFLEX) 4 MG tablet Take 1 tablet (4 mg total) by mouth every 6 (six) hours as needed for muscle spasms. (Patient not taking: Reported on 07/11/2015)   Facility-Administered Encounter Medications as of 08/16/2015  Medication  . testosterone cypionate (DEPOTESTOSTERONE CYPIONATE) injection 200 mg      Review of Systems  Constitutional: Positive for fatigue.  Respiratory: Negative.   Cardiovascular: Negative.   Gastrointestinal: Negative.   Neurological: Negative.   Psychiatric/Behavioral: Positive for sleep disturbance.       Objective:   Physical Exam  Constitutional: He is oriented to person, place, and time. He appears well-developed.  Cardiovascular: Normal rate and regular rhythm.   Pulmonary/Chest: Effort normal and breath sounds normal.  Neurological: He is alert and oriented to person, place, and time.  Psychiatric: He has a normal mood and affect. His behavior is normal.   BP 105/72 (BP Location: Right Arm, Patient Position:   Sitting, Cuff Size: Normal)   Pulse 76   Temp 97.4 F (36.3 C) (Oral)   Ht 5' 10" (1.778 m)   Wt 188 lb 12.8 oz (85.6 kg)   BMI 27.09 kg/m         Assessment & Plan:    1. Chronic fatigue I suspect his fatigue may be related to depression. Will screen for other common causes such as anemia or hypothyroidism etc. but I have asked  him to think about trying an antidepressant for 1 or 2 months to see if that would help - CBC with Differential/Platelet - BMP8+EGFR - TSH  Wardell Honour MD

## 2015-08-16 ENCOUNTER — Encounter: Payer: Self-pay | Admitting: Family Medicine

## 2015-08-16 ENCOUNTER — Ambulatory Visit (INDEPENDENT_AMBULATORY_CARE_PROVIDER_SITE_OTHER): Payer: Medicare Other | Admitting: Family Medicine

## 2015-08-16 VITALS — BP 105/72 | HR 76 | Temp 97.4°F | Ht 70.0 in | Wt 188.8 lb

## 2015-08-16 DIAGNOSIS — R1011 Right upper quadrant pain: Secondary | ICD-10-CM | POA: Diagnosis not present

## 2015-08-16 DIAGNOSIS — R5382 Chronic fatigue, unspecified: Secondary | ICD-10-CM | POA: Diagnosis not present

## 2015-08-16 MED ORDER — ALPRAZOLAM 0.5 MG PO TBDP: 0.5000 mg | ORAL_TABLET | Freq: Two times a day (BID) | ORAL | 0 refills | Status: DC | PRN

## 2015-08-17 LAB — CBC WITH DIFFERENTIAL/PLATELET
BASOS ABS: 0 10*3/uL (ref 0.0–0.2)
Basos: 0 %
EOS (ABSOLUTE): 0.1 10*3/uL (ref 0.0–0.4)
EOS: 1 %
HEMATOCRIT: 48.3 % (ref 37.5–51.0)
Hemoglobin: 16.5 g/dL (ref 12.6–17.7)
IMMATURE GRANULOCYTES: 0 %
Immature Grans (Abs): 0 10*3/uL (ref 0.0–0.1)
LYMPHS ABS: 1.9 10*3/uL (ref 0.7–3.1)
Lymphs: 21 %
MCH: 29.8 pg (ref 26.6–33.0)
MCHC: 34.2 g/dL (ref 31.5–35.7)
MCV: 87 fL (ref 79–97)
MONOS ABS: 0.5 10*3/uL (ref 0.1–0.9)
Monocytes: 6 %
NEUTROS PCT: 72 %
Neutrophils Absolute: 6.5 10*3/uL (ref 1.4–7.0)
PLATELETS: 222 10*3/uL (ref 150–379)
RBC: 5.53 x10E6/uL (ref 4.14–5.80)
RDW: 14 % (ref 12.3–15.4)
WBC: 9.1 10*3/uL (ref 3.4–10.8)

## 2015-08-17 LAB — BMP8+EGFR
BUN/Creatinine Ratio: 19 (ref 10–24)
BUN: 17 mg/dL (ref 8–27)
CALCIUM: 9 mg/dL (ref 8.6–10.2)
CHLORIDE: 100 mmol/L (ref 96–106)
CO2: 25 mmol/L (ref 18–29)
Creatinine, Ser: 0.91 mg/dL (ref 0.76–1.27)
GFR calc Af Amer: 101 mL/min/{1.73_m2} (ref >59)
GFR, EST NON AFRICAN AMERICAN: 88 mL/min/{1.73_m2} (ref >59)
GLUCOSE: 151 mg/dL — AB (ref 65–99)
POTASSIUM: 4.8 mmol/L (ref 3.5–5.2)
SODIUM: 141 mmol/L (ref 134–144)

## 2015-08-17 LAB — TSH: TSH: 1.35 u[IU]/mL (ref 0.450–4.500)

## 2015-08-22 ENCOUNTER — Telehealth: Payer: Self-pay | Admitting: *Deleted

## 2015-08-25 MED ORDER — ALPRAZOLAM 0.5 MG PO TABS: 0.5000 mg | ORAL_TABLET | Freq: Every evening | ORAL | 0 refills | Status: DC | PRN

## 2015-08-25 NOTE — Telephone Encounter (Signed)
Will refill generic Xanax rather than the niravam

## 2015-08-25 NOTE — Telephone Encounter (Signed)
Pt notified RX is ready for pick up Verbalizes understanding

## 2015-08-26 ENCOUNTER — Ambulatory Visit (INDEPENDENT_AMBULATORY_CARE_PROVIDER_SITE_OTHER): Payer: Medicare Other | Admitting: Family Medicine

## 2015-08-26 ENCOUNTER — Encounter: Payer: Self-pay | Admitting: Family Medicine

## 2015-08-26 VITALS — BP 116/74 | HR 72 | Temp 98.1°F | Ht 70.0 in | Wt 186.6 lb

## 2015-08-26 DIAGNOSIS — F329 Major depressive disorder, single episode, unspecified: Secondary | ICD-10-CM | POA: Diagnosis not present

## 2015-08-26 DIAGNOSIS — F32A Depression, unspecified: Secondary | ICD-10-CM

## 2015-08-26 MED ORDER — ZOLPIDEM TARTRATE 10 MG PO TABS: 10.0000 mg | ORAL_TABLET | Freq: Every evening | ORAL | 0 refills | Status: DC | PRN

## 2015-08-26 NOTE — Telephone Encounter (Signed)
Generic alprazolam has been approved

## 2015-08-26 NOTE — Progress Notes (Signed)
Subjective:    Patient ID: Anthony Form., male    DOB: [birth year], 65 y.o.   MRN: [id number]  HPI 65 year old gentleman who was seen here most recently for fatigue. We did the standard workup all of which was nonrevealing. I thought the hand and several times before that his symptoms may be related to depression which is also leading to insomnia. The latter is his complaint today. He does have Xanax that he takes at nighttime that helps relaxing but does not really induce sleep. He scored a 14 on the depression scale. We discussed options that is treating with antidepressant versus sedative hypnotic versus antihistamine.  Patient Active Problem List   Diagnosis Date Noted  . Overweight (BMI 25.0-29.9) 05/11/2015  . S/P right THA, AA 05/10/2015  . Recurrent left inguinal hernia 07/26/2014  . Chronic right hip pain 06/23/2014  . S/P bilateral inguinal hernia repair 10/30/2013  . Nodule of left lung 05/29/2013  . Bilateral inguinal hernia 05/28/2013  . Umbilical hernia 123456  . Major psychotic depression, recurrent ([place]) 12/16/2012  . Inadequate anticoagulation 04/29/2012  . Hypocalcemia 07/28/2010  . CAD (coronary artery disease)   . HLD (hyperlipidemia)   . COPD (chronic obstructive pulmonary disease) ([place])   . Tobacco abuse   . Depression    Outpatient Encounter Prescriptions as of 08/26/2015  Medication Sig  . ALPRAZolam (XANAX) 0.5 MG tablet Take 1 tablet (0.5 mg total) by mouth at bedtime as needed for anxiety.  . ferrous sulfate 325 (65 FE) MG tablet Take 1 tablet (325 mg total) by mouth 3 (three) times daily after meals.  Marland Kitchen glucosamine-chondroitin 500-400 MG tablet Take 1 tablet by mouth 2 (two) times daily.   . LUTEIN PO Take 1-2 tablets by mouth every morning.  Marland Kitchen MELATONIN PO Take 2-3 tablets by mouth at bedtime as needed (For sleep.).  Marland Kitchen polyethylene glycol (MIRALAX / GLYCOLAX) packet Take 17 g by mouth 2 (two) times daily.  Marland Kitchen testosterone cypionate  (DEPO-TESTOSTERONE) 200 MG/ML injection Inject 1 mL (200 mg total) into the muscle every 28 (twenty-eight) days.  Marland Kitchen tetrahydrozoline 0.05 % ophthalmic solution Place 2-3 drops into both eyes 4 (four) times daily as needed (For eye redness.).  Marland Kitchen Tolnaftate (ANTIFUNGAL SPRAY POWDER EX) Apply 1 application topically daily as needed (Applies to groin area.).  Marland Kitchen tolnaftate (ANTIFUNGAL) 1 % cream Apply 1 application topically daily as needed (He uses on feet.).  Marland Kitchen traMADol (ULTRAM) 50 MG tablet Take 1-2 tablets 3 times a day when necessary   Facility-Administered Encounter Medications as of 08/26/2015  Medication  . testosterone cypionate (DEPOTESTOSTERONE CYPIONATE) injection 200 mg      Review of Systems  Constitutional: Positive for fatigue.  Respiratory: Negative.   Cardiovascular: Negative.   Psychiatric/Behavioral: Positive for confusion and sleep disturbance.       Objective:   Physical Exam  Constitutional: He is oriented to person, place, and time. He appears well-developed and well-nourished.  Cardiovascular: Normal rate and regular rhythm.   Pulmonary/Chest: Effort normal and breath sounds normal.  Neurological: He is alert and oriented to person, place, and time.  Psychiatric: He has a normal mood and affect. His behavior is normal.   BP 116/74 (BP Location: Right Arm, Patient Position: Sitting, Cuff Size: Normal)   Pulse 72   Temp 98.1 F (36.7 C) (Oral)   Ht 5\' 10"  (1.778 m)   Wt 186 lb 9.6 oz (84.6 kg)   BMI 26.77 kg/m  Assessment & Plan:  1. Depression I really think this man is depressed but he will not take antidepressants because he has not had good experience in the past with efficacy. I'm also wondering a little bit about bipolar disorder because he has depression along with some times what sounds like manic activity. At his request will go ahead with sedative hypnotic and try Ambien 10 mg. He will let us know how this works I do think if he can rest  well that that will help his fatigue.  Wardell Honour MD

## 2015-09-05 DIAGNOSIS — Z471 Aftercare following joint replacement surgery: Secondary | ICD-10-CM | POA: Diagnosis not present

## 2015-09-05 DIAGNOSIS — Z96641 Presence of right artificial hip joint: Secondary | ICD-10-CM | POA: Diagnosis not present

## 2015-09-05 DIAGNOSIS — M25551 Pain in right hip: Secondary | ICD-10-CM | POA: Diagnosis not present

## 2015-09-22 ENCOUNTER — Encounter: Payer: Self-pay | Admitting: Family Medicine

## 2015-09-22 ENCOUNTER — Ambulatory Visit (INDEPENDENT_AMBULATORY_CARE_PROVIDER_SITE_OTHER): Payer: Medicare Other | Admitting: Family Medicine

## 2015-09-22 VITALS — BP 115/78 | HR 65 | Temp 98.0°F | Ht 70.0 in | Wt 194.0 lb

## 2015-09-22 DIAGNOSIS — F329 Major depressive disorder, single episode, unspecified: Secondary | ICD-10-CM | POA: Diagnosis not present

## 2015-09-22 DIAGNOSIS — F32A Depression, unspecified: Secondary | ICD-10-CM

## 2015-09-22 MED ORDER — ZOLPIDEM TARTRATE 10 MG PO TABS: 10.0000 mg | ORAL_TABLET | Freq: Every evening | ORAL | 0 refills | Status: DC | PRN

## 2015-09-22 NOTE — Progress Notes (Signed)
Subjective:    Patient ID: Anthony Form., male    DOB: 03-04-1950, 65 y.o.   MRN: [id number]  HPI Patient here today for fatigue and depression.  Patient denies any physical symptoms. He has gained some weight so when he has nothing else to do he either eats or smokes. Insomnia and sleeping is still a big problem. Ambien that I have given him seems to help some but he has run out of that. He talked about checking himself into behavioral health that [facility] in [place]. Every thing about this patient points toward depression. The problem comes in that he has tried multiple antidepressants and seen several psychiatrists in the past and has had negative experiences. I think he realizes he needs some help and today says he is willing to see a psychiatrist if we help with the referral. There have also been some issues with concentration and memory that suggest a component of psychomotor retardation.    Patient Active Problem List   Diagnosis Date Noted  . Overweight (BMI 25.0-29.9) 05/11/2015  . S/P right THA, AA 05/10/2015  . Recurrent left inguinal hernia 07/26/2014  . Chronic right hip pain 06/23/2014  . S/P bilateral inguinal hernia repair 10/30/2013  . Nodule of left lung 05/29/2013  . Bilateral inguinal hernia 05/28/2013  . Umbilical hernia 123456  . Major psychotic depression, recurrent ([place]) 12/16/2012  . Inadequate anticoagulation 04/29/2012  . Hypocalcemia 07/28/2010  . CAD (coronary artery disease)   . HLD (hyperlipidemia)   . COPD (chronic obstructive pulmonary disease) ([place])   . Tobacco abuse   . Depression    Outpatient Encounter Prescriptions as of 09/22/2015  Medication Sig  . ALPRAZolam (XANAX) 0.5 MG tablet Take 1 tablet (0.5 mg total) by mouth at bedtime as needed for anxiety.  . ferrous sulfate 325 (65 FE) MG tablet Take 1 tablet (325 mg total) by mouth 3 (three) times daily after meals.  Marland Kitchen glucosamine-chondroitin 500-400 MG tablet Take 1 tablet by  mouth 2 (two) times daily.   . LUTEIN PO Take 1-2 tablets by mouth every morning.  Marland Kitchen MELATONIN PO Take 2-3 tablets by mouth at bedtime as needed (For sleep.).  Marland Kitchen polyethylene glycol (MIRALAX / GLYCOLAX) packet Take 17 g by mouth 2 (two) times daily.  Marland Kitchen testosterone cypionate (DEPO-TESTOSTERONE) 200 MG/ML injection Inject 1 mL (200 mg total) into the muscle every 28 (twenty-eight) days.  Marland Kitchen tetrahydrozoline 0.05 % ophthalmic solution Place 2-3 drops into both eyes 4 (four) times daily as needed (For eye redness.).  Marland Kitchen Tolnaftate (ANTIFUNGAL SPRAY POWDER EX) Apply 1 application topically daily as needed (Applies to groin area.).  Marland Kitchen tolnaftate (ANTIFUNGAL) 1 % cream Apply 1 application topically daily as needed (He uses on feet.).  Marland Kitchen traMADol (ULTRAM) 50 MG tablet Take 1-2 tablets 3 times a day when necessary  . zolpidem (AMBIEN) 10 MG tablet Take 1 tablet (10 mg total) by mouth at bedtime as needed for sleep.   Facility-Administered Encounter Medications as of 09/22/2015  Medication  . testosterone cypionate (DEPOTESTOSTERONE CYPIONATE) injection 200 mg      Review of Systems  Constitutional: Positive for fatigue.  HENT: Negative.   Eyes: Negative.   Respiratory: Negative.   Cardiovascular: Negative.   Gastrointestinal: Negative.   Endocrine: Negative.   Genitourinary: Negative.   Musculoskeletal: Positive for arthralgias (hip ).  Skin: Negative.   Allergic/Immunologic: Negative.   Neurological: Positive for weakness.  Hematological: Negative.   Psychiatric/Behavioral: Positive for suicidal ideas (depressed).  Objective:   Physical Exam  Constitutional: He is oriented to person, place, and time. He appears well-developed and well-nourished.  Cardiovascular: Normal rate and regular rhythm.   Pulmonary/Chest: Effort normal and breath sounds normal.  Neurological: He is alert and oriented to person, place, and time.   BP 115/78 (BP Location: Left Arm)   Pulse 65   Temp 98 F  (36.7 C) (Oral)   Ht 5\' 10"  (1.778 m)   Wt 194 lb (88 kg)   BMI 27.84 kg/m         Assessment & Plan:  1. Depression I had considered starting antidepressant but if he is willing to except for referral I think that would be the better choice. In the meantime I've refilled his Ambien.  Wardell Honour MD - Ambulatory referral to Psychiatry

## 2015-09-26 ENCOUNTER — Ambulatory Visit (INDEPENDENT_AMBULATORY_CARE_PROVIDER_SITE_OTHER): Payer: Medicare Other | Admitting: Cardiovascular Disease

## 2015-09-26 ENCOUNTER — Encounter: Payer: Self-pay | Admitting: Cardiovascular Disease

## 2015-09-26 VITALS — BP 128/68 | HR 64 | Ht 70.0 in | Wt 196.0 lb

## 2015-09-26 DIAGNOSIS — E785 Hyperlipidemia, unspecified: Secondary | ICD-10-CM | POA: Diagnosis not present

## 2015-09-26 DIAGNOSIS — I2510 Atherosclerotic heart disease of native coronary artery without angina pectoris: Secondary | ICD-10-CM | POA: Diagnosis not present

## 2015-09-26 DIAGNOSIS — Z720 Tobacco use: Secondary | ICD-10-CM

## 2015-09-26 MED ORDER — ASPIRIN EC 81 MG PO TBEC: 81.0000 mg | DELAYED_RELEASE_TABLET | Freq: Every day | ORAL | 3 refills | Status: DC

## 2015-09-26 MED ORDER — METOPROLOL TARTRATE 25 MG PO TABS: 12.5000 mg | ORAL_TABLET | Freq: Two times a day (BID) | ORAL | 11 refills | Status: DC

## 2015-09-26 MED ORDER — LOVASTATIN 20 MG PO TABS: 20.0000 mg | ORAL_TABLET | Freq: Every day | ORAL | 11 refills | Status: DC

## 2015-09-26 NOTE — Progress Notes (Signed)
Chief Complaint  Patient presents with  . Anxiety     History of Present Illness: 65 yo male with history of CAD, HLD, tobacco abuse, bipolar disorder, COPD, DVT 2008 here today for cardiac follow up. He presented to [facility] in June 2012  with an inferior ST elevation myocardial infarction. Cardiac catheterization demonstrated a totally occluded mid RCA. This was treated with a Vision bare metal stent. Residual disease included 10-20% distal left main stenosis, 40-50% proximal LAD and 20-30% mid LAD, 30% proximal RCA. Left ventriculogram demonstrated inferior hypokinesis with an EF of 50%. Recurrence of DVT March 2014 and has been on Xarelto.   He is here today for follow up. No chest pain or SOB. He continues to smoke 2 ppd.   Primary Care Physician: Wardell Honour, MD   Past Medical History:  Diagnosis Date  . Anxiety   . Arthritis   . Bipolar disorder ([place])   . CAD (coronary artery disease)    a. INF STEMI 07/04/10:  tx with thrombectomy + Vision BMS to [facility];  cath 07/04/10: dLM 10-20%, pLAD 40-50%, mLAD 20-30%, pRCA 30%, mRCA occluded and tx with PCI, EF 50% with inf HK. A Multilink  . COPD (chronic obstructive pulmonary disease) ([place])   . Depression   . Glucose intolerance (impaired glucose tolerance)    A1c 6.2 06/2010  . History of DVT (deep vein thrombosis)    traumatic, s/p coumadin tx.  Marland Kitchen HLD (hyperlipidemia)   . Hypertension    pt states is currently on no medications  . Myocardial infarction ([place])    around 2013  . Shortness of breath    walking distance or climbing stairs  . Tobacco abuse   . Urinary frequency     Past Surgical History:  Procedure Laterality Date  . CARDIAC CATHETERIZATION    . ELECTROCARDIOGRAM     Showed inferolateral ST-elevation and a code STEMI was activated. In the ER, he was treated with morphine, herarin, and 600 mg of Plavix. He was transferred emergently to [facility].   . INGUINAL HERNIA REPAIR Left 07/26/2014     Procedure: OPEN REPAIR OF RECURENT LEFT INGUINAL HERNIA REPAIR WITH MESH;  Surgeon: Greer Pickerel, MD;  Location: [facility];  Service: General;  Laterality: Left;  . INSERTION OF MESH Bilateral 10/30/2013   Procedure: INSERTION OF MESH;  Surgeon: Gayland Curry, MD;  Location: [facility];  Service: General;  Laterality: Bilateral;  . LAPAROSCOPIC INGUINAL HERNIA WITH UMBILICAL HERNIA Bilateral 10/30/2013   Procedure: laparoscopic repair left pantaloom hernia with mesh, laparoscopic right inguinal hernia with mesh, OPEN REPAIR OF UMBILICAL HERNIA ;  Surgeon: Gayland Curry, MD;  Location: [facility];  Service: General;  Laterality: Bilateral;  . LAPAROSCOPY N/A 07/26/2014   Procedure: LAPAROSCOPY DIAGNOSTIC;  Surgeon: Greer Pickerel, MD;  Location: [facility];  Service: General;  Laterality: N/A;  . stent placement    . TOTAL HIP ARTHROPLASTY Right 05/10/2015   Procedure: RIGHT TOTAL HIP ARTHROPLASTY ANTERIOR APPROACH;  Surgeon: Paralee Cancel, MD;  Location: [facility];  Service: Orthopedics;  Laterality: Right;  Marland Kitchen VASECTOMY      Current Outpatient Prescriptions  Medication Sig Dispense Refill  . ALPRAZolam (XANAX) 0.5 MG tablet Take 1 tablet (0.5 mg total) by mouth at bedtime as needed for anxiety. 30 tablet 0  . ferrous sulfate 325 (65 FE) MG tablet Take 1 tablet (325 mg total) by mouth 3 (three) times daily after meals.  3  . glucosamine-chondroitin  500-400 MG tablet Take 1 tablet by mouth 2 (two) times daily.     . LUTEIN PO Take 1-2 tablets by mouth every morning.    Marland Kitchen MELATONIN PO Take 2-3 tablets by mouth at bedtime as needed (For sleep.).    Marland Kitchen polyethylene glycol (MIRALAX / GLYCOLAX) packet Take 17 g by mouth 2 (two) times daily. 14 each 0  . testosterone cypionate (DEPO-TESTOSTERONE) 200 MG/ML injection Inject 1 mL (200 mg total) into the muscle every 28 (twenty-eight) days. 10 mL 0  . tetrahydrozoline 0.05 % ophthalmic solution Place 2-3 drops into both eyes 4 (four) times daily as needed (For eye redness.).     Marland Kitchen Tolnaftate (ANTIFUNGAL SPRAY POWDER EX) Apply 1 application topically daily as needed (Applies to groin area.).    Marland Kitchen tolnaftate (ANTIFUNGAL) 1 % cream Apply 1 application topically daily as needed (He uses on feet.).    Marland Kitchen traMADol (ULTRAM) 50 MG tablet Take 1-2 tablets 3 times a day when necessary 90 tablet 0  . zolpidem (AMBIEN) 10 MG tablet Take 1 tablet (10 mg total) by mouth at bedtime as needed for sleep. 30 tablet 0  . aspirin EC 81 MG tablet Take 1 tablet (81 mg total) by mouth daily. 90 tablet 3  . lovastatin (MEVACOR) 20 MG tablet Take 1 tablet (20 mg total) by mouth at bedtime. 30 tablet 11  . metoprolol tartrate (LOPRESSOR) 25 MG tablet Take 0.5 tablets (12.5 mg total) by mouth 2 (two) times daily. 30 tablet 11   Current Facility-Administered Medications  Medication Dose Route Frequency Provider Last Rate Last Dose  . testosterone cypionate (DEPOTESTOSTERONE CYPIONATE) injection 200 mg  200 mg Intramuscular Q28 days Wardell Honour, MD   200 mg at 08/02/15 1141    Allergies  Allergen Reactions  . Flomax [Tamsulosin Hcl] Other (See Comments)    This medication makes patient feel sick  . Penicillins Other (See Comments)    Reaction unknown occurred during childhood Has patient had a PCN reaction causing immediate rash, facial/tongue/throat swelling, SOB or lightheadedness with hypotension: unsure - childhood reaction Has patient had a PCN reaction causing severe rash involving mucus membranes or skin necrosis: unsure - childhood reaction Has patient had a PCN reaction that required hospitalization no Has patient had a PCN reaction occurring within the last 10 years: no If all of the above answers are "NO", then may p    Social History   Social History  . Marital status: Divorced    Spouse name: N/A  . Number of children: N/A  . Years of education: N/A   Occupational History  . Not on file.   Social History Main Topics  . Smoking status: Current Every Day Smoker     Packs/day: 2.00    Years: 45.00    Types: Cigarettes  . Smokeless tobacco: Never Used  . Alcohol use No     Comment: past hx of ETOH use was in inpt facility per court order  - 20 years  . Drug use: No     Comment: snorted heroin and cocaine  . Sexual activity: Not on file   Other Topics Concern  . Not on file   Social History Narrative   Lives in [place]. He lives alone. He has kids but is not married.     Family History  Problem Relation Age of Onset  . Coronary artery disease Father     Review of Systems:  As stated in the HPI and otherwise negative.  BP 128/68   Pulse 64   Ht 5\' 10"  (1.778 m)   Wt 196 lb (88.9 kg)   BMI 28.12 kg/m   Physical Examination: General: Well developed, well nourished, NAD  HEENT: OP clear, mucus membranes moist  SKIN: warm, dry. No rashes. Neuro: No focal deficits  Musculoskeletal: Muscle strength 5/5 all ext  Psychiatric: Mood and affect normal  Neck: No JVD, no carotid bruits, no thyromegaly, no lymphadenopathy.  Lungs:Clear bilaterally, no wheezes, rhonci, crackles Cardiovascular: Regular rate and rhythm. No murmurs, gallops or rubs. Abdomen:Soft. Bowel sounds present. Non-tender.  Extremities: No lower extremity edema. Pulses are 2 + in the bilateral DP/PT. No groin hematoma or bruit.   EKG:  EKG is ordered today. The ekg ordered today demonstrates NSR, rate 65 bpm. Inferior Q waves.   Recent Labs: 05/11/2015: Hemoglobin 14.5 08/16/2015: BUN 17; Creatinine, Ser 0.91; Platelets 222; Potassium 4.8; Sodium 141; TSH 1.350   Lipid Panel    Component Value Date/Time   CHOL 160 03/25/2014 1017   TRIG 132 03/25/2014 1017   HDL 39 (L) 03/25/2014 1017   CHOLHDL 4.1 03/25/2014 1017   CHOLHDL 4.2 07/05/2010 0450   VLDL 23 07/05/2010 0450   LDLCALC 95 03/25/2014 1017     Wt Readings from Last 3 Encounters:  09/26/15 196 lb (88.9 kg)  09/22/15 194 lb (88 kg)  08/26/15 186 lb 9.6 oz (84.6 kg)     Other studies Reviewed: Additional  studies/ records that were reviewed today include: . Review of the above records demonstrates:   Assessment and Plan:   1. CAD: No chest pain suggestive of angina. He had been on ASA, statin, beta blocker. He is not sure why he is not taking now. Will restart ASA 81 mg daily, Lopressor 12.5 mg twice daily and Lovastatin.   2. DVT: resolved. He is now off of Xarelto.   3. HLD: Will restart statin today. Will need lipids checked in 3 months along with LFTs.   5. Tobacco abuse: Still smoking 2 packs per day. He is advised to stop smoking.   Current medicines are reviewed at length with the patient today.  The patient does not have concerns regarding medicines.  The following changes have been made:  no change  Labs/ tests ordered today include:   Orders Placed This Encounter  Procedures  . Lipid Profile  . Hepatic function panel  . EKG 12-Lead     Disposition:   FU with me in 12 months   Signed, Lauree Chandler, MD 09/26/2015 9:19 AM    [facility] [place], [place], [place]  [address] Phone: [phone]; Fax: [phone]

## 2015-09-26 NOTE — Patient Instructions (Signed)
Medication Instructions:  Your physician has recommended you make the following change in your medication:  Start Aspirin 81 mg by mouth daily. Start metoprolol tartrate 12.5 mg by mouth twice daily. Start lovastatin 20 mg by mouth daily at bedtime    Labwork: Your physician recommends that you return for lab work in: 12 weeks.  This will be fasting. The lab opens at 7:30 AM.  Scheduled for December 11,2017   Testing/Procedures: none  Follow-Up: Your physician wants you to follow-up in: 12 months.  You will receive a reminder letter in the mail two months in advance. If you don't receive a letter, please call our office to schedule the follow-up appointment.   Any Other Special Instructions Will Be Listed Below (If Applicable).     If you need a refill on your cardiac medications before your next appointment, please call your pharmacy.

## 2015-09-27 ENCOUNTER — Telehealth (HOSPITAL_COMMUNITY): Payer: Self-pay | Admitting: *Deleted

## 2015-09-27 NOTE — Telephone Encounter (Signed)
left voice message regarding an appointment. 

## 2015-10-04 ENCOUNTER — Telehealth: Payer: Self-pay | Admitting: Family Medicine

## 2015-10-04 NOTE — Telephone Encounter (Signed)
Appt. Made to see Dr. Sabra Heck.

## 2015-10-05 NOTE — Progress Notes (Signed)
Subjective:    Patient ID: Anthony Form., male    DOB: 1950/05/04, 65 y.o.   MRN: [id number]  HPI 65 year old gentleman with some scrotal swelling. He thinks the hernia and he had repaired a few years ago may have come back. He has some pain with standing but has had no other urinary symptoms  Patient Active Problem List   Diagnosis Date Noted  . Overweight (BMI 25.0-29.9) 05/11/2015  . S/P right THA, AA 05/10/2015  . Recurrent left inguinal hernia 07/26/2014  . Chronic right hip pain 06/23/2014  . S/P bilateral inguinal hernia repair 10/30/2013  . Nodule of left lung 05/29/2013  . Bilateral inguinal hernia 05/28/2013  . Umbilical hernia [id number]  . Major psychotic depression, recurrent ([place]) 12/16/2012  . Inadequate anticoagulation 04/29/2012  . Hypocalcemia 07/28/2010  . CAD (coronary artery disease)   . HLD (hyperlipidemia)   . COPD (chronic obstructive pulmonary disease) ([place])   . Tobacco abuse   . Depression    Outpatient Encounter Prescriptions as of 10/06/2015  Medication Sig  . ALPRAZolam (XANAX) 0.5 MG tablet Take 1 tablet (0.5 mg total) by mouth at bedtime as needed for anxiety.  Marland Kitchen aspirin EC 81 MG tablet Take 1 tablet (81 mg total) by mouth daily.  . ferrous sulfate 325 (65 FE) MG tablet Take 1 tablet (325 mg total) by mouth 3 (three) times daily after meals.  Marland Kitchen glucosamine-chondroitin 500-400 MG tablet Take 1 tablet by mouth 2 (two) times daily.   Marland Kitchen lovastatin (MEVACOR) 20 MG tablet Take 1 tablet (20 mg total) by mouth at bedtime.  . LUTEIN PO Take 1-2 tablets by mouth every morning.  Marland Kitchen MELATONIN PO Take 2-3 tablets by mouth at bedtime as needed (For sleep.).  Marland Kitchen metoprolol tartrate (LOPRESSOR) 25 MG tablet Take 0.5 tablets (12.5 mg total) by mouth 2 (two) times daily.  . polyethylene glycol (MIRALAX / GLYCOLAX) packet Take 17 g by mouth 2 (two) times daily.  Marland Kitchen testosterone cypionate (DEPO-TESTOSTERONE) 200 MG/ML injection Inject 1 mL (200 mg total) into the  muscle every 28 (twenty-eight) days.  Marland Kitchen tetrahydrozoline 0.05 % ophthalmic solution Place 2-3 drops into both eyes 4 (four) times daily as needed (For eye redness.).  Marland Kitchen Tolnaftate (ANTIFUNGAL SPRAY POWDER EX) Apply 1 application topically daily as needed (Applies to groin area.).  Marland Kitchen tolnaftate (ANTIFUNGAL) 1 % cream Apply 1 application topically daily as needed (He uses on feet.).  Marland Kitchen traMADol (ULTRAM) 50 MG tablet Take 1-2 tablets 3 times a day when necessary  . zolpidem (AMBIEN) 10 MG tablet Take 1 tablet (10 mg total) by mouth at bedtime as needed for sleep.   Facility-Administered Encounter Medications as of 10/06/2015  Medication  . testosterone cypionate (DEPOTESTOSTERONE CYPIONATE) injection 200 mg      Review of Systems  Genitourinary: Positive for scrotal swelling.  Psychiatric/Behavioral: Positive for dysphoric mood and sleep disturbance.       Objective:   Physical Exam  Constitutional: He appears well-developed and well-nourished.  Genitourinary:  Genitourinary Comments: I cannot detect a hernia area there is swelling of the scrotum consistent with probable hydrocele. There is no pain with palpation. I offered a urology referral but he declines at present time. We did agree that if it continued to get larger and become more uncomfortable and he would see urologist  Psychiatric:  Patient seems a little "brighter" today. His daughter is given him 2 kittens that he is bonding with and I think that's a good thing. He  does have appointment with psychiatry in about 2-3 weeks   BP 131/82   Pulse 77   Temp 97.5 F (36.4 C) (Oral)   Ht 5\' 10"  (1.778 m)   Wt 198 lb (89.8 kg)   BMI 28.41 kg/m         Assessment & Plan:   1. Scrotal swelling I think this is related to hydrocele. I can detect no hernia today. Patient declined urology referral but will agree if symptoms worsen   Wardell Honour MD

## 2015-10-06 ENCOUNTER — Encounter: Payer: Self-pay | Admitting: Family Medicine

## 2015-10-06 ENCOUNTER — Ambulatory Visit (INDEPENDENT_AMBULATORY_CARE_PROVIDER_SITE_OTHER): Payer: Medicare Other | Admitting: Family Medicine

## 2015-10-06 VITALS — BP 131/82 | HR 77 | Temp 97.5°F | Ht 70.0 in | Wt 198.0 lb

## 2015-10-06 DIAGNOSIS — N5089 Other specified disorders of the male genital organs: Secondary | ICD-10-CM

## 2015-10-06 DIAGNOSIS — I2510 Atherosclerotic heart disease of native coronary artery without angina pectoris: Secondary | ICD-10-CM

## 2015-10-06 MED ORDER — TESTOSTERONE CYPIONATE 200 MG/ML IM SOLN: 200.0000 mg | INTRAMUSCULAR | 0 refills | Status: DC

## 2015-10-14 ENCOUNTER — Telehealth: Payer: Self-pay | Admitting: *Deleted

## 2015-10-14 DIAGNOSIS — F329 Major depressive disorder, single episode, unspecified: Secondary | ICD-10-CM | POA: Diagnosis not present

## 2015-10-14 DIAGNOSIS — F319 Bipolar disorder, unspecified: Secondary | ICD-10-CM | POA: Diagnosis not present

## 2015-10-14 NOTE — Telephone Encounter (Signed)
Incoming call from pt  Pt called c/o increased anxiety and paranoia He states he is also having some suicidal ideation Pt instructed to go to ED Pt stated he didn't want to go to ED EMS contacted to go eval pt per Dr Warrick Parisian
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
## 2015-10-18 ENCOUNTER — Ambulatory Visit (INDEPENDENT_AMBULATORY_CARE_PROVIDER_SITE_OTHER): Payer: Medicare Other | Admitting: Psychiatry

## 2015-10-18 ENCOUNTER — Encounter (HOSPITAL_COMMUNITY): Payer: Self-pay | Admitting: Psychiatry

## 2015-10-18 VITALS — BP 103/71 | HR 68 | Ht 70.0 in | Wt 197.0 lb

## 2015-10-18 DIAGNOSIS — F331 Major depressive disorder, recurrent, moderate: Secondary | ICD-10-CM | POA: Diagnosis not present

## 2015-10-18 DIAGNOSIS — Z7982 Long term (current) use of aspirin: Secondary | ICD-10-CM | POA: Diagnosis not present

## 2015-10-18 DIAGNOSIS — Z8249 Family history of ischemic heart disease and other diseases of the circulatory system: Secondary | ICD-10-CM | POA: Diagnosis not present

## 2015-10-18 DIAGNOSIS — F17210 Nicotine dependence, cigarettes, uncomplicated: Secondary | ICD-10-CM

## 2015-10-18 DIAGNOSIS — R45851 Suicidal ideations: Secondary | ICD-10-CM

## 2015-10-18 DIAGNOSIS — Z79899 Other long term (current) drug therapy: Secondary | ICD-10-CM

## 2015-10-18 MED ORDER — MIRTAZAPINE 15 MG PO TBDP: 15.0000 mg | ORAL_TABLET | Freq: Every day | ORAL | 0 refills | Status: DC

## 2015-10-18 NOTE — Patient Instructions (Addendum)
1. Start Mirtazapine 15 mg at night 2. You may call crisis hotline numbers 1-800-273-TALK or 1-800-SUICIDE or 911 if any suicidal ideation gets worse 3. Return to clinic in one month

## 2015-10-19 ENCOUNTER — Telehealth (HOSPITAL_COMMUNITY): Payer: Self-pay | Admitting: *Deleted

## 2015-10-19 NOTE — Telephone Encounter (Signed)
Pt called stating he is needing refills for his Klonopin. Per pt, his PCP put him on this medication and when he came into his appt on 10-18-2015, he had enough medications. Per pt, when he last went in his bottle to look, he realized that he was out of his medication. Informed pt that this medication is not on his list nor is it in Dr. Ivor Reining notes. Per pt, PCP put him on it and  when he called them to see if they could give him more refills, they informed him that due to him having a psychiatrist now, he should have her fill this medication. Pt number is  [phone].

## 2015-10-20 ENCOUNTER — Telehealth (HOSPITAL_COMMUNITY): Payer: Self-pay | Admitting: Psychiatry

## 2015-10-20 ENCOUNTER — Telehealth (HOSPITAL_COMMUNITY): Payer: Self-pay | Admitting: *Deleted

## 2015-10-20 MED ORDER — MIRTAZAPINE 15 MG PO TABS: 15.0000 mg | ORAL_TABLET | Freq: Every day | ORAL | 0 refills | Status: DC

## 2015-10-20 NOTE — Telephone Encounter (Signed)
Pt called stating when he went to the pharmacy to pick up his Remeron, he noticed it was the Soluble tabs. Per pt he do not want the Sol-Tabs. Per pt he wants the regular tablets and if Dr. Modesta Messing could resend it to the pharmacy for them to fill the current one. Per pt he thinks it was a mistake but need Dr. Modesta Messing to resend Remeron to his pharmacy. Pt number is [phone].

## 2015-10-20 NOTE — Telephone Encounter (Signed)
Called the patient, left a voice message that regular Mirtazapine was ordered electronically.

## 2015-10-20 NOTE — Telephone Encounter (Signed)
noted 

## 2015-10-20 NOTE — Telephone Encounter (Signed)
PHONE CALL FROM PATIENT.  WENT TO PICK UP HIS mirtazapine (REMERON) AND IT WAS $67 BECAUSE IT WAS THE MELT IN YOUR MOUTH.

## 2015-10-20 NOTE — Telephone Encounter (Signed)
Completed by provider.

## 2015-10-25 ENCOUNTER — Encounter: Payer: Self-pay | Admitting: Family Medicine

## 2015-10-25 ENCOUNTER — Ambulatory Visit (INDEPENDENT_AMBULATORY_CARE_PROVIDER_SITE_OTHER): Payer: Medicare Other | Admitting: Family Medicine

## 2015-10-25 VITALS — BP 119/79 | HR 98 | Temp 97.8°F | Ht 70.0 in | Wt 198.0 lb

## 2015-10-25 DIAGNOSIS — K921 Melena: Secondary | ICD-10-CM | POA: Diagnosis not present

## 2015-10-25 DIAGNOSIS — E349 Endocrine disorder, unspecified: Secondary | ICD-10-CM

## 2015-10-25 DIAGNOSIS — I2510 Atherosclerotic heart disease of native coronary artery without angina pectoris: Secondary | ICD-10-CM

## 2015-10-25 NOTE — Progress Notes (Signed)
Subjective:    Patient ID: Anthony Form., male    DOB: 27-Dec-1950, 65 y.o.   MRN: [id number]  HPI Pt is here for blood in stool, RLQ pain, and fatigue. The blood was just noted one time it was read and also mixed in with the stool. He does suffer from constipation sometimes. Has seen psychiatry since he was here instead was started on mirtazapine 15 mg. Unsure of the plan of psychiatry that dose is not as likely to be an antidepressant as it is more for sleep and perhaps that was the goal of the psychiatrist Also continuing to have problems with scrotal swelling was thought last time to be possible hydrocele. Other possibility may be a varicocele  Patient Active Problem List   Diagnosis Date Noted  . Moderate episode of recurrent major depressive disorder ([place]) 10/18/2015  . Overweight (BMI 25.0-29.9) 05/11/2015  . S/P right THA, AA 05/10/2015  . Recurrent left inguinal hernia 07/26/2014  . Chronic right hip pain 06/23/2014  . S/P bilateral inguinal hernia repair 10/30/2013  . Nodule of left lung 05/29/2013  . Bilateral inguinal hernia 05/28/2013  . Umbilical hernia [id number]  . Major psychotic depression, recurrent ([place]) 12/16/2012  . Inadequate anticoagulation 04/29/2012  . Hypocalcemia 07/28/2010  . CAD (coronary artery disease)   . HLD (hyperlipidemia)   . COPD (chronic obstructive pulmonary disease) ([place])   . Tobacco abuse   . Depression    Outpatient Encounter Prescriptions as of 10/25/2015  Medication Sig  . aspirin EC 81 MG tablet Take 1 tablet (81 mg total) by mouth daily.  . ferrous sulfate 325 (65 FE) MG tablet Take 1 tablet (325 mg total) by mouth 3 (three) times daily after meals.  Marland Kitchen glucosamine-chondroitin 500-400 MG tablet Take 1 tablet by mouth 2 (two) times daily.   Marland Kitchen lovastatin (MEVACOR) 20 MG tablet Take 1 tablet (20 mg total) by mouth at bedtime.  . LUTEIN PO Take 1-2 tablets by mouth every morning.  Marland Kitchen MELATONIN PO Take 2-3 tablets by mouth at bedtime  as needed (For sleep.).  Marland Kitchen metoprolol tartrate (LOPRESSOR) 25 MG tablet Take 0.5 tablets (12.5 mg total) by mouth 2 (two) times daily.  . mirtazapine (REMERON) 15 MG tablet Take 1 tablet (15 mg total) by mouth at bedtime.  . polyethylene glycol (MIRALAX / GLYCOLAX) packet Take 17 g by mouth 2 (two) times daily.  Marland Kitchen testosterone cypionate (DEPO-TESTOSTERONE) 200 MG/ML injection Inject 1 mL (200 mg total) into the muscle every 28 (twenty-eight) days.  Marland Kitchen tetrahydrozoline 0.05 % ophthalmic solution Place 2-3 drops into both eyes 4 (four) times daily as needed (For eye redness.).  Marland Kitchen Tolnaftate (ANTIFUNGAL SPRAY POWDER EX) Apply 1 application topically daily as needed (Applies to groin area.).  Marland Kitchen tolnaftate (ANTIFUNGAL) 1 % cream Apply 1 application topically daily as needed (He uses on feet.).  Marland Kitchen traMADol (ULTRAM) 50 MG tablet Take 1-2 tablets 3 times a day when necessary (Patient taking differently: Take 1-2 tablets 3 times a day when necessary, 10-18-2015 per pt he only takes 1 tablets QOD)  . zolpidem (AMBIEN) 10 MG tablet Take 1 tablet (10 mg total) by mouth at bedtime as needed for sleep.   Facility-Administered Encounter Medications as of 10/25/2015  Medication  . testosterone cypionate (DEPOTESTOSTERONE CYPIONATE) injection 200 mg       Review of Systems  Constitutional: Positive for fatigue.  HENT: Negative.   Eyes: Negative.   Respiratory: Negative.   Cardiovascular: Negative.   Gastrointestinal: Positive  for abdominal pain (RLQ pain).  Endocrine: Negative.   Genitourinary: Negative.   Musculoskeletal: Negative.   Skin: Negative.   Allergic/Immunologic: Negative.   Neurological: Negative.   Hematological: Negative.   Psychiatric/Behavioral: Negative.        Objective:   Physical Exam  Constitutional: He appears well-developed and well-nourished.  Abdominal: Soft. He exhibits no mass.  Genitourinary:  Genitourinary Comments: Rectal exam shows no hemorrhoids or masses  Hemoccult was sent    BP 119/79 (BP Location: Left Arm)   Pulse 98   Temp 97.8 F (36.6 C) (Oral)   Ht 5\' 10"  (1.778 m)   Wt 198 lb (89.8 kg)   BMI 28.41 kg/m        Assessment & Plan:  1. Blood in stool Will do Hemoccult. Last hemoglobin was checked 2 months ago and it was greater than 16. - Fecal occult blood, imunochemical  Wardell Honour MD

## 2015-10-27 LAB — FECAL OCCULT BLOOD, IMMUNOCHEMICAL: FECAL OCCULT BLD: POSITIVE — AB

## 2015-10-28 ENCOUNTER — Other Ambulatory Visit (INDEPENDENT_AMBULATORY_CARE_PROVIDER_SITE_OTHER): Payer: Medicare Other

## 2015-10-28 DIAGNOSIS — Z1211 Encounter for screening for malignant neoplasm of colon: Secondary | ICD-10-CM

## 2015-10-28 NOTE — Addendum Note (Signed)
Addended by: Michaela Corner on: 10/28/2015 01:32 PM   Modules accepted: Orders

## 2015-10-30 LAB — FECAL OCCULT BLOOD, IMMUNOCHEMICAL: Fecal Occult Bld: NEGATIVE

## 2015-10-31 ENCOUNTER — Other Ambulatory Visit: Payer: Self-pay | Admitting: Family Medicine

## 2015-11-10 ENCOUNTER — Encounter (INDEPENDENT_AMBULATORY_CARE_PROVIDER_SITE_OTHER): Payer: Self-pay | Admitting: Internal Medicine

## 2015-11-10 ENCOUNTER — Encounter (INDEPENDENT_AMBULATORY_CARE_PROVIDER_SITE_OTHER): Payer: Self-pay

## 2015-11-15 NOTE — Progress Notes (Signed)
[place] MD/PA/NP OP Progress Note  11/17/2015 2:19 PM [place].  MRN:  [id number]  Chief Complaint:  Chief Complaint    Depression; Follow-up     Subjective:  "sleepless" HPI:  Patient presents for follow up appointment. He states that he continues to have insomnia, although it got a little better after starting mirtazapine. He feels depressed, weaker every day and spends most of the day at home. He states he is concerned that he has hematochezia and he will see GI specialist. He feels depressed. He has SI, although he denies any intent/plans. He denies Ah/VH. He denies alcohol use or drug use  Visit Diagnosis:    ICD-9-CM ICD-10-CM   1. Moderate episode of recurrent major depressive disorder ([place]) 296.32 F33.1     Past Psychiatric History:  Outpatient: not since 2014 [facility]states he has "manic" ), [facility] in 12/9-12/15/2014 for depression with SI Previous suicide attempt: Patient has a history of suicidal attempt in 1995 when he overdosed on sleeping pills. Past trials of medication: Wellbutrin, Cymbalta  Past Medical History:  Past Medical History:  Diagnosis Date  . Anxiety   . Arthritis   . Bipolar disorder ([place])   . CAD (coronary artery disease)    a. INF STEMI 07/04/10:  tx with thrombectomy + [facility] to [facility];  cath 07/04/10: dLM 10-20%, pLAD 40-50%, mLAD 20-30%, pRCA 30%, mRCA occluded and tx with PCI, EF 50% with inf HK. A Multilink  . COPD (chronic obstructive pulmonary disease) ([place])   . Depression   . Glucose intolerance (impaired glucose tolerance)    A1c 6.2 06/2010  . History of DVT (deep vein thrombosis)    traumatic, s/p coumadin tx.  Marland Kitchen HLD (hyperlipidemia)   . Hypertension    pt states is currently on no medications  . Myocardial infarction    around 2013  . Shortness of breath    walking distance or climbing stairs  . Tobacco abuse   . Urinary frequency     Past Surgical History:  Procedure  Laterality Date  . CARDIAC CATHETERIZATION    . ELECTROCARDIOGRAM     Showed inferolateral ST-elevation and a code STEMI was activated. In the ER, he was treated with morphine, herarin, and 600 mg of Plavix. He was transferred emergently to [facility].   . INGUINAL HERNIA REPAIR Left 07/26/2014   Procedure: OPEN REPAIR OF RECURENT LEFT INGUINAL HERNIA REPAIR WITH MESH;  Surgeon: Greer Pickerel, MD;  Location: [facility];  Service: General;  Laterality: Left;  . INSERTION OF MESH Bilateral 10/30/2013   Procedure: INSERTION OF MESH;  Surgeon: Gayland Curry, MD;  Location: [facility];  Service: General;  Laterality: Bilateral;  . LAPAROSCOPIC INGUINAL HERNIA WITH UMBILICAL HERNIA Bilateral 10/30/2013   Procedure: laparoscopic repair left pantaloom hernia with mesh, laparoscopic right inguinal hernia with mesh, OPEN REPAIR OF UMBILICAL HERNIA ;  Surgeon: Gayland Curry, MD;  [facility]: [facility];  Service: General;  Laterality: Bilateral;  . LAPAROSCOPY N/A 07/26/2014   Procedure: LAPAROSCOPY DIAGNOSTIC;  Surgeon: Greer Pickerel, MD;  [facility]: [facility];  Service: General;  Laterality: N/A;  . stent placement    . TOTAL HIP ARTHROPLASTY Right 05/10/2015   Procedure: RIGHT TOTAL HIP ARTHROPLASTY ANTERIOR APPROACH;  Surgeon: Paralee Cancel, MD;  Location: [facility];  Service: Orthopedics;  Laterality: Right;  Marland Kitchen VASECTOMY      Family Psychiatric History: Denies (Details unknown),   Family History:  Family History  Problem Relation Age of Onset  . Coronary artery disease Father     Social History:  Social History   Social History  . Marital status: Divorced    Spouse name: N/A  . Number of children: N/A  . Years of education: N/A   Social History Main Topics  . Smoking status: Current Every Day Smoker    Packs/day: 2.00    Years: 45.00    Types: Cigarettes  . Smokeless tobacco: Never Used  . Alcohol use No     Comment: past hx of ETOH use was in inpt facility per court order  - 20 years, 10-18-2015 per  pt not anymore,per pt stopped about 15-20 yrs ago  . Drug use: No     Comment: snorted heroin and cocaine, 10-18-2015 per pt in the past he did Marijuana only and nothing else  . Sexual activity: Not Asked   Other Topics Concern  . None   Social History Narrative   Lives in [place]. He lives alone. He has kids but is not married.     Allergies:  Allergies  Allergen Reactions  . Flomax [Tamsulosin Hcl] Other (See Comments)    This medication makes patient feel sick  . Penicillins Other (See Comments)    Reaction unknown occurred during childhood Has patient had a PCN reaction causing immediate rash, facial/tongue/throat swelling, SOB or lightheadedness with hypotension: unsure - childhood reaction Has patient had a PCN reaction causing severe rash involving mucus membranes or skin necrosis: unsure - childhood reaction Has patient had a PCN reaction that required hospitalization no Has patient had a PCN reaction occurring within the last 10 years: no If all of the above answers are "NO", then may p    Metabolic Disorder Labs: Lab Results  Component Value Date   HGBA1C 6.2 (H) 07/05/2010   MPG 131 (H) 07/05/2010   No results found for: PROLACTIN Lab Results  Component Value Date   CHOL 160 03/25/2014   TRIG 132 03/25/2014   HDL 39 (L) 03/25/2014   CHOLHDL 4.1 03/25/2014   VLDL 23 07/05/2010   LDLCALC 95 03/25/2014   LDLCALC 104 (H) 07/05/2010     Current Medications: Current Outpatient Prescriptions  Medication Sig Dispense Refill  . aspirin EC 81 MG tablet Take 1 tablet (81 mg total) by mouth daily. 90 tablet 3  . ferrous sulfate 325 (65 FE) MG tablet Take 1 tablet (325 mg total) by mouth 3 (three) times daily after meals.  3  . LUTEIN PO Take 1-2 tablets by mouth every morning.    Marland Kitchen MELATONIN PO Take 2-3 tablets by mouth at bedtime as needed (For sleep.).    Marland Kitchen metoprolol tartrate (LOPRESSOR) 25 MG tablet Take 0.5 tablets (12.5 mg total) by mouth 2 (two) times daily.  30 tablet 11  . mirtazapine (REMERON) 30 MG tablet Take 0.5 tablets (15 mg total) by mouth at bedtime. 30 tablet 0  . tetrahydrozoline 0.05 % ophthalmic solution Place 2-3 drops into both eyes 4 (four) times daily as needed (For eye redness.).    Marland Kitchen Tolnaftate (ANTIFUNGAL SPRAY POWDER EX) Apply 1 application topically daily as needed (Applies to groin area.).    Marland Kitchen tolnaftate (ANTIFUNGAL) 1 % cream Apply 1 application topically daily as needed (He uses on feet.).    Marland Kitchen glucosamine-chondroitin 500-400 MG tablet Take 1 tablet by mouth 2 (two) times daily.     Marland Kitchen lovastatin (MEVACOR) 20 MG tablet Take 1 tablet (20 mg total) by  mouth at bedtime. (Patient not taking: Reported on 11/17/2015) 30 tablet 11  . testosterone cypionate (DEPO-TESTOSTERONE) 200 MG/ML injection Inject 1 mL (200 mg total) into the muscle every 28 (twenty-eight) days. (Patient not taking: Reported on 11/17/2015) 10 mL 0  . traMADol (ULTRAM) 50 MG tablet Take 1-2 tablets 3 times a day when necessary (Patient not taking: Reported on 11/17/2015) 90 tablet 0   Current Facility-Administered Medications  Medication Dose Route Frequency Provider Last Rate Last Dose  . testosterone cypionate (DEPOTESTOSTERONE CYPIONATE) injection 200 mg  200 mg Intramuscular Q28 days Wardell Honour, MD   200 mg at 10/25/15 1556    Neurologic: Headache: No Seizure: No Paresthesias: No  Musculoskeletal: Strength & Muscle Tone: within normal limits Gait & Station: normal Patient leans: N/A  Psychiatric Specialty Exam: Review of Systems  Gastrointestinal: Positive for blood in stool.  Neurological: Positive for weakness.  Psychiatric/Behavioral: Positive for depression and suicidal ideas. Negative for hallucinations and substance abuse. The patient is nervous/anxious and has insomnia.   All other systems reviewed and are negative.   Blood pressure 130/89, pulse 81, height 5\' 10"  (1.778 m), weight 204 lb 12.8 oz (92.9 kg).Body mass index is 29.39  kg/m.  General Appearance: Casual  Eye Contact:  Good  Speech:  Clear and Coherent  Volume:  Normal  Mood:  Depressed  Affect:  down  Thought Process:  Coherent and Goal Directed  Orientation:  Full (Time, Place, and Person)  Thought Content: Logical  Perceptions: denies AH/VH  Suicidal Thoughts:  Yes.  without intent/plan  Homicidal Thoughts:  No  Memory:  Immediate;   Good Recent;   Good Remote;   Good  Judgement:  Fair  Insight:  Fair  Psychomotor Activity:  Normal  Concentration:  Concentration: Good and Attention Span: Good  Recall:  Good  Fund of Knowledge: Good  Language: Good  Akathisia:  NA  Handed:  Right  AIMS (if indicated):  N/A  Assets:  Communication Skills Desire for Improvement  ADL's:  Intact  Cognition: WNL  Sleep:  poor   Assessment 65yo male with bipolar disorder per chart, CAD, HLD, tobacco abuse, COPD, DVT 2008, S/P right THA, AA, s/p bilateral inguinal hernia repair, who presented to the clinic for depression. Psychosocial stressors including divorce and financial strain.   # MDD  R/o MDD with mixed episode Patient continues to endorse neurovegetative symptoms with chronic SI. Will increase Mirtazapine to target his mood and insomnia. Noted that he had periods of hypomanic episode post operation; it is likely related to anesthetic use, but will closely monitor any manic symptoms. Discussed behavioral activation. Patient will greatly benefit from CBT; will make a referral.   Plan - Increase mirtazapine 30 mg at night - Patient to try walking 15 minutes every day  - RTC in one month - Will make a referral for therapy  The patient demonstrates the following  risk factors for suicide: Chronic risk factors for suicide include psychiatric disorder,  medical illness, previous suicide attempt, demographic factors (male, >60yo). Acute risk factors for suicide include family or marital conflict, unemployment, social withdrawal/isolation, medical  problems.  Protective factors for this patient include positive social support, hope for the future, being amenable to [facility] treatment, seeking for help.  Considering these factors, the overall suicide risk at this point appears to be low. Emergency resources including 911, ED, suicide hotline was discussed.   Treatment Plan Summary: Medication management  Norman Clay, MD 11/17/2015, 2:19 PM

## 2015-11-17 ENCOUNTER — Ambulatory Visit (INDEPENDENT_AMBULATORY_CARE_PROVIDER_SITE_OTHER): Payer: Medicare Other | Admitting: Psychiatry

## 2015-11-17 ENCOUNTER — Encounter (HOSPITAL_COMMUNITY): Payer: Self-pay | Admitting: Psychiatry

## 2015-11-17 VITALS — BP 130/89 | HR 81 | Ht 70.0 in | Wt 204.8 lb

## 2015-11-17 DIAGNOSIS — Z79899 Other long term (current) drug therapy: Secondary | ICD-10-CM

## 2015-11-17 DIAGNOSIS — Z888 Allergy status to other drugs, medicaments and biological substances status: Secondary | ICD-10-CM

## 2015-11-17 DIAGNOSIS — R45851 Suicidal ideations: Secondary | ICD-10-CM

## 2015-11-17 DIAGNOSIS — F331 Major depressive disorder, recurrent, moderate: Secondary | ICD-10-CM | POA: Diagnosis not present

## 2015-11-17 DIAGNOSIS — F17210 Nicotine dependence, cigarettes, uncomplicated: Secondary | ICD-10-CM | POA: Diagnosis not present

## 2015-11-17 DIAGNOSIS — I2510 Atherosclerotic heart disease of native coronary artery without angina pectoris: Secondary | ICD-10-CM | POA: Diagnosis not present

## 2015-11-17 DIAGNOSIS — Z880 Allergy status to penicillin: Secondary | ICD-10-CM

## 2015-11-17 MED ORDER — MIRTAZAPINE 30 MG PO TABS: 15.0000 mg | ORAL_TABLET | Freq: Every day | ORAL | 0 refills | Status: DC

## 2015-11-17 NOTE — Patient Instructions (Signed)
1. Increase mirtazapine 30 mg at night 2. Try to have a daily schedule- would recommend taking a walk for 15 minutes every day 3. Return to clinic in one month 4. You will be scheduled with a therapist

## 2015-11-21 ENCOUNTER — Encounter (INDEPENDENT_AMBULATORY_CARE_PROVIDER_SITE_OTHER): Payer: Self-pay | Admitting: Internal Medicine

## 2015-11-21 ENCOUNTER — Telehealth: Payer: Self-pay | Admitting: Family Medicine

## 2015-11-21 ENCOUNTER — Other Ambulatory Visit (INDEPENDENT_AMBULATORY_CARE_PROVIDER_SITE_OTHER): Payer: Self-pay | Admitting: Internal Medicine

## 2015-11-21 ENCOUNTER — Ambulatory Visit (INDEPENDENT_AMBULATORY_CARE_PROVIDER_SITE_OTHER): Payer: Medicare Other | Admitting: Internal Medicine

## 2015-11-21 VITALS — BP 104/70 | HR 60 | Temp 97.5°F | Ht 70.0 in | Wt 209.3 lb

## 2015-11-21 DIAGNOSIS — K625 Hemorrhage of anus and rectum: Secondary | ICD-10-CM

## 2015-11-21 DIAGNOSIS — I2510 Atherosclerotic heart disease of native coronary artery without angina pectoris: Secondary | ICD-10-CM | POA: Diagnosis not present

## 2015-11-21 DIAGNOSIS — R195 Other fecal abnormalities: Secondary | ICD-10-CM | POA: Diagnosis not present

## 2015-11-21 NOTE — Progress Notes (Signed)
Subjective:    Patient ID: Anthony [name]., male    DOB: [birth year], 65 y.o.   MRN: [id number]  HPI Referred by Alain Honey for guaiac positive stool. He states he has seen blood in his stool a couple of times recently. Stool are brown in color. Saw streaks of blood on the stool. Symptoms for 4-5 months. No family hx of colon cancer. He has never undergone a colonoscopy in the past.  His appetite is good. No weight loss. He has a BM daily. Takes Aleve x 1 a day.  Review of Systems Past Medical History:  Diagnosis Date  . Anxiety   . Arthritis   . Bipolar disorder ([place])   . CAD (coronary artery disease)    a. INF STEMI 07/04/10:  tx with thrombectomy + [facility] BMS to [facility];  cath 07/04/10: dLM 10-20%, pLAD 40-50%, mLAD 20-30%, pRCA 30%, mRCA occluded and tx with PCI, EF 50% with inf HK. A Multilink  . COPD (chronic obstructive pulmonary disease) ([place])   . Depression   . Glucose intolerance (impaired glucose tolerance)    A1c 6.2 06/2010  . History of DVT (deep vein thrombosis)    traumatic, s/p coumadin tx.  Marland Kitchen HLD (hyperlipidemia)   . Hypertension    pt states is currently on no medications  . Myocardial infarction    around 2013  . Shortness of breath    walking distance or climbing stairs  . Tobacco abuse   . Urinary frequency     Past Surgical History:  Procedure Laterality Date  . CARDIAC CATHETERIZATION    . ELECTROCARDIOGRAM     Showed inferolateral ST-elevation and a code STEMI was activated. In the ER, he was treated with morphine, herarin, and 600 mg of Plavix. He was transferred emergently to [facility].   . INGUINAL HERNIA REPAIR Left 07/26/2014   Procedure: OPEN REPAIR OF RECURENT LEFT INGUINAL HERNIA REPAIR WITH MESH;  Surgeon: Greer Pickerel, MD;  [facility];  Service: General;  Laterality: Left;  . INSERTION OF MESH Bilateral 10/30/2013   Procedure: INSERTION OF MESH;  Surgeon: Gayland Curry, MD;  [facility];  Service: General;   Laterality: Bilateral;  . LAPAROSCOPIC INGUINAL HERNIA WITH UMBILICAL HERNIA Bilateral 10/30/2013   Procedure: laparoscopic repair left pantaloom hernia with mesh, laparoscopic right inguinal hernia with mesh, OPEN REPAIR OF UMBILICAL HERNIA ;  Surgeon: Gayland Curry, MD;  [facility];  Service: General;  Laterality: Bilateral;  . LAPAROSCOPY N/A 07/26/2014   Procedure: LAPAROSCOPY DIAGNOSTIC;  Surgeon: Greer Pickerel, MD;  [facility];  Service: General;  Laterality: N/A;  . stent placement    . TOTAL HIP ARTHROPLASTY Right 05/10/2015   Procedure: RIGHT TOTAL HIP ARTHROPLASTY ANTERIOR APPROACH;  Surgeon: Paralee Cancel, MD;  [facility];  Service: Orthopedics;  Laterality: Right;  Marland Kitchen VASECTOMY      Allergies  Allergen Reactions  . Flomax [Tamsulosin Hcl] Other (See Comments)    This medication makes patient feel sick  . Penicillins Other (See Comments)    Reaction unknown occurred during childhood Has patient had a PCN reaction causing immediate rash, facial/tongue/throat swelling, SOB or lightheadedness with hypotension: unsure - childhood reaction Has patient had a PCN reaction causing severe rash involving mucus membranes or skin necrosis: unsure - childhood reaction Has patient had a PCN reaction that required hospitalization no Has patient had a PCN reaction occurring within the last 10 years: no If all of the above answers  are "NO", then may p    Current Outpatient Prescriptions on File Prior to Visit  Medication Sig Dispense Refill  . aspirin EC 81 MG tablet Take 1 tablet (81 mg total) by mouth daily. 90 tablet 3  . lovastatin (MEVACOR) 20 MG tablet Take 1 tablet (20 mg total) by mouth at bedtime. 30 tablet 11  . LUTEIN PO Take 1-2 tablets by mouth every morning.    Marland Kitchen MELATONIN PO Take 2-3 tablets by mouth at bedtime as needed (For sleep.).    Marland Kitchen metoprolol tartrate (LOPRESSOR) 25 MG tablet Take 0.5 tablets (12.5 mg total) by mouth 2 (two) times daily. 30 tablet 11  .  mirtazapine (REMERON) 30 MG tablet Take 0.5 tablets (15 mg total) by mouth at bedtime. 30 tablet 0  . tetrahydrozoline 0.05 % ophthalmic solution Place 2-3 drops into both eyes 4 (four) times daily as needed (For eye redness.).    Marland Kitchen glucosamine-chondroitin 500-400 MG tablet Take 1 tablet by mouth as needed.      Current Facility-Administered Medications on File Prior to Visit  Medication Dose Route Frequency Provider Last Rate Last Dose  . testosterone cypionate (DEPOTESTOSTERONE CYPIONATE) injection 200 mg  200 mg Intramuscular Q28 days Wardell Honour, MD   200 mg at 10/25/15 1556       Objective:   Physical Exam Blood pressure 104/70, pulse 60, temperature 97.5 F (36.4 C), height 5\' 10"  (1.778 m), weight 209 lb 4.8 oz (94.9 kg).   Alert and oriented. Skin warm and dry. Oral mucosa is moist.   . Sclera anicteric, conjunctivae is pink. Thyroid not enlarged. No cervical lymphadenopathy. Lungs clear. Heart regular rate and rhythm.  Abdomen is soft. Bowel sounds are positive. No hepatomegaly. No abdominal masses felt. No tenderness.  No edema to lower extremities.      Assessment & Plan:  Guaiac positive stool. Rectal bleeding. Colonic neoplasm, polyp, AVM, hemorrhoids needs to be ruled out. Colonoscopy. The risks and benefits such as perforation, bleeding, and infection were reviewed with the patient and is agreeable.

## 2015-11-21 NOTE — Patient Instructions (Signed)
Colonoscopy.  The risks and benefits such as perforation, bleeding, and infection were reviewed with the patient and is agreeable. 

## 2015-11-22 ENCOUNTER — Telehealth (INDEPENDENT_AMBULATORY_CARE_PROVIDER_SITE_OTHER): Payer: Self-pay | Admitting: *Deleted

## 2015-11-22 ENCOUNTER — Other Ambulatory Visit: Payer: Self-pay | Admitting: *Deleted

## 2015-11-22 ENCOUNTER — Encounter (INDEPENDENT_AMBULATORY_CARE_PROVIDER_SITE_OTHER): Payer: Self-pay | Admitting: *Deleted

## 2015-11-22 DIAGNOSIS — K469 Unspecified abdominal hernia without obstruction or gangrene: Secondary | ICD-10-CM

## 2015-11-22 MED ORDER — ZOLPIDEM TARTRATE 10 MG PO TABS: 10.0000 mg | ORAL_TABLET | Freq: Every evening | ORAL | 0 refills | Status: DC | PRN

## 2015-11-22 NOTE — Telephone Encounter (Signed)
Patient needs trilyte 

## 2015-11-22 NOTE — Telephone Encounter (Signed)
Okay to refer to general surgery either in [place] or  at [facility] surgery in [place]

## 2015-11-22 NOTE — Telephone Encounter (Signed)
Referral placed for surgery

## 2015-11-22 NOTE — Telephone Encounter (Signed)
I agree with patient he needs to see GI not orthopedics

## 2015-11-22 NOTE — Telephone Encounter (Signed)
Pt was seen by GI yesterday. Pt wanted refill for sleep of either Ambien or Xanax. Dr. Sabra Heck verbally ok'd Ambien that it would help better. Rx called to [facility]

## 2015-11-23 DIAGNOSIS — K625 Hemorrhage of anus and rectum: Secondary | ICD-10-CM | POA: Insufficient documentation

## 2015-11-25 MED ORDER — PEG 3350-KCL-NA BICARB-NACL 420 G PO SOLR: 4000.0000 mL | Freq: Once | ORAL | 0 refills | Status: AC

## 2015-12-15 NOTE — Progress Notes (Signed)
[place] MD/PA/NP OP Progress Note  12/16/2015 11:59 AM Anthony [name].  MRN:  [id number]  Chief Complaint:  Chief Complaint    Depression; Anxiety; Follow-up     Subjective:  "Meh..soso" HPI:  Patient presents for follow up appointment. Patient states that he feels "despondent." He feels weaker and feels very tired after taking a walk. He was started on zolpidem and sleeps better; sleeps 4 hours. He stayed by himself on Thanksgiving day. He occasionally texts with hi children. Although he used to enjoy bike riding, he does not do it anymore. He feels anxious and has panic attack a couple of times per week. He reports passive SI.   Visit Diagnosis:    ICD-9-CM ICD-10-CM   1. Moderate episode of recurrent major depressive disorder ([place]) 296.32 F33.1     Past Psychiatric History:  Outpatient: not since 2014 [facility]states he has "manic" ), [facility] in 12/9-12/15/2014 for depression with SI Previous suicide attempt: Patient has a history of suicidal attempt in 1995 when he overdosed on sleeping pills. Past trials of medication: Wellbutrin, Cymbalta  Past Medical History:  Past Medical History:  Diagnosis Date  . Anxiety   . Arthritis   . Bipolar disorder ([place])   . CAD (coronary artery disease)    a. INF STEMI 07/04/10:  tx with thrombectomy + [facility] to [facility];  cath 07/04/10: dLM 10-20%, pLAD 40-50%, mLAD 20-30%, pRCA 30%, mRCA occluded and tx with PCI, EF 50% with inf HK. A Multilink  . COPD (chronic obstructive pulmonary disease) ([place])   . Depression   . Glucose intolerance (impaired glucose tolerance)    A1c 6.2 06/2010  . History of DVT (deep vein thrombosis)    traumatic, s/p coumadin tx.  Marland Kitchen HLD (hyperlipidemia)   . Hypertension    pt states is currently on no medications  . Myocardial infarction    around 2013  . Shortness of breath    walking distance or climbing stairs  . Tobacco abuse   . Urinary frequency     Past  Surgical History:  Procedure Laterality Date  . CARDIAC CATHETERIZATION    . ELECTROCARDIOGRAM     Showed inferolateral ST-elevation and a code STEMI was activated. In the ER, he was treated with morphine, herarin, and 600 mg of Plavix. He was transferred emergently to [facility].   . INGUINAL HERNIA REPAIR Left 07/26/2014   Procedure: OPEN REPAIR OF RECURENT LEFT INGUINAL HERNIA REPAIR WITH MESH;  Surgeon: Greer Pickerel, MD;  Location: [facility];  Service: General;  Laterality: Left;  . INSERTION OF MESH Bilateral 10/30/2013   Procedure: INSERTION OF MESH;  Surgeon: Gayland Curry, MD;  Location: [facility];  Service: General;  Laterality: Bilateral;  . LAPAROSCOPIC INGUINAL HERNIA WITH UMBILICAL HERNIA Bilateral 10/30/2013   Procedure: laparoscopic repair left pantaloom hernia with mesh, laparoscopic right inguinal hernia with mesh, OPEN REPAIR OF UMBILICAL HERNIA ;  Surgeon: Gayland Curry, MD;  Location: [facility];  Service: General;  Laterality: Bilateral;  . LAPAROSCOPY N/A 07/26/2014   Procedure: LAPAROSCOPY DIAGNOSTIC;  Surgeon: Greer Pickerel, MD;  Location: [facility];  Service: General;  Laterality: N/A;  . stent placement    . TOTAL HIP ARTHROPLASTY Right 05/10/2015   Procedure: RIGHT TOTAL HIP ARTHROPLASTY ANTERIOR APPROACH;  Surgeon: Paralee Cancel, MD;  Location: [facility];  Service: Orthopedics;  Laterality: Right;  Marland Kitchen VASECTOMY      Family Psychiatric History: Denies (Details  unknown),   Family History:  Family History  Problem Relation Age of Onset  . Coronary artery disease Father     Social History:  Social History   Social History  . Marital status: Divorced    Spouse name: N/A  . Number of children: N/A  . Years of education: N/A   Social History Main Topics  . Smoking status: Current Every Day Smoker    Packs/day: 2.00    Years: 45.00    Types: Cigarettes  . Smokeless tobacco: Never Used  . Alcohol use No     Comment: past hx of ETOH use was in inpt facility per court  order  - 20 years, 10-18-2015 per pt not anymore,per pt stopped about 15-20 yrs ago  . Drug use: No     Comment: snorted heroin and cocaine, 10-18-2015 per pt in the past he did Marijuana only and nothing else  . Sexual activity: Not Asked   Other Topics Concern  . None   Social History Narrative   Lives in [place]. He lives alone. He has kids but is not married.     Allergies:  Allergies  Allergen Reactions  . Flomax [Tamsulosin Hcl] Other (See Comments)    This medication makes patient feel sick  . Penicillins Other (See Comments)    Reaction unknown occurred during childhood Has patient had a PCN reaction causing immediate rash, facial/tongue/throat swelling, SOB or lightheadedness with hypotension: unsure - childhood reaction Has patient had a PCN reaction causing severe rash involving mucus membranes or skin necrosis: unsure - childhood reaction Has patient had a PCN reaction that required hospitalization no Has patient had a PCN reaction occurring within the last 10 years: no If all of the above answers are "NO", then may p    Metabolic Disorder Labs: Lab Results  Component Value Date   HGBA1C 6.2 (H) 07/05/2010   MPG 131 (H) 07/05/2010   No results found for: PROLACTIN Lab Results  Component Value Date   CHOL 160 03/25/2014   TRIG 132 03/25/2014   HDL 39 (L) 03/25/2014   CHOLHDL 4.1 03/25/2014   VLDL 23 07/05/2010   LDLCALC 95 03/25/2014   LDLCALC 104 (H) 07/05/2010     Current Medications: Current Outpatient Prescriptions  Medication Sig Dispense Refill  . ARIPiprazole (ABILIFY) 2 MG tablet Take 1 tablet (2 mg total) by mouth daily. 30 tablet 1  . aspirin EC 81 MG tablet Take 1 tablet (81 mg total) by mouth daily. 90 tablet 3  . glucosamine-chondroitin 500-400 MG tablet Take 1 tablet by mouth as needed.     . lovastatin (MEVACOR) 20 MG tablet Take 1 tablet (20 mg total) by mouth at bedtime. 30 tablet 11  . LUTEIN PO Take 1-2 tablets by mouth every morning.     Marland Kitchen MELATONIN PO Take 2-3 tablets by mouth at bedtime as needed (For sleep.).    Marland Kitchen metoprolol tartrate (LOPRESSOR) 25 MG tablet Take 0.5 tablets (12.5 mg total) by mouth 2 (two) times daily. 30 tablet 11  . mirtazapine (REMERON) 30 MG tablet Take 1 tablet (30 mg total) by mouth at bedtime. 30 tablet 1  . naproxen sodium (ANAPROX) 220 MG tablet Take 220 mg by mouth 2 (two) times daily with a meal.    . tetrahydrozoline 0.05 % ophthalmic solution Place 2-3 drops into both eyes 4 (four) times daily as needed (For eye redness.).    Marland Kitchen zolpidem (AMBIEN) 10 MG tablet Take 1 tablet (10 mg total) by  mouth at bedtime as needed for sleep. 30 tablet 0   Current Facility-Administered Medications  Medication Dose Route Frequency Provider Last Rate Last Dose  . testosterone cypionate (DEPOTESTOSTERONE CYPIONATE) injection 200 mg  200 mg Intramuscular Q28 days Wardell Honour, MD   200 mg at 10/25/15 1556    Neurologic: Headache: No Seizure: No Paresthesias: No  Musculoskeletal: Strength & Muscle Tone: within normal limits Gait & Station: normal Patient leans: N/A  Psychiatric Specialty Exam: Review of Systems  Gastrointestinal: Positive for blood in stool.  Neurological: Positive for weakness.  Psychiatric/Behavioral: Positive for depression and suicidal ideas. Negative for hallucinations and substance abuse. The patient is nervous/anxious and has insomnia.   All other systems reviewed and are negative.   Blood pressure 105/77, pulse 95, height 5\' 10"  (1.778 m), weight 211 lb 6.4 oz (95.9 kg).Body mass index is 30.33 kg/m.  General Appearance: Casual  Eye Contact:  Good  Speech:  Clear and Coherent  Volume:  Normal  Mood:  Depressed  Affect:  down  Thought Process:  Coherent and Goal Directed  Orientation:  Full (Time, Place, and Person)  Thought Content: Logical  Perceptions: denies AH/VH  Suicidal Thoughts:  Yes.  without intent/plan  Homicidal Thoughts:  No  Memory:  Immediate;    Good Recent;   Good Remote;   Good  Judgement:  Fair  Insight:  Fair  Psychomotor Activity:  Normal  Concentration:  Concentration: Good and Attention Span: Good  Recall:  Good  Fund of Knowledge: Good  Language: Good  Akathisia:  NA  Handed:  Right  AIMS (if indicated):  N/A  Assets:  Communication Skills Desire for Improvement  ADL's:  Intact  Cognition: WNL  Sleep:  poor   Assessment 65yo male with bipolar disorder per chart, CAD, HLD, tobacco abuse, COPD, DVT 2008, S/P right THA, AA, s/p bilateral inguinal hernia repair, who presented to the clinic for depression. Psychosocial stressors including divorce and financial strain.   # MDD  R/o MDD with mixed episode Patient continues to endorse neurovegetative symptoms with chronic SI. Will continue mirtazapine and add Abilify as augmentation therapy. Noted that although he had periods of hypomanic episode post operation; it is likely related to anesthetic use;  will closely monitor any manic symptoms. Discussed behavioral activation. Patient to see a therapist; he will greatly benefit from CBT.   Plan 1. Continue mirtazapine 30 mg at night 2. Start Abilify 2 mg daily 3. Return to clinic in January 4. Patient is scheduled to see Ms. Bynum for therapy  The patient demonstrates the following  risk factors for suicide: Chronic risk factors for suicide include psychiatric disorder,  medical illness, previous suicide attempt, demographic factors (male, >48 yo). Acute risk factors for suicide include family or marital conflict, unemployment, social withdrawal/isolation, medical problems.  Protective factors for this patient include positive social support, hope for the future, being amenable to [facility] treatment, seeking for help.  Considering these factors, the overall suicide risk at this point appears to be low. Emergency resources including 911, ED, suicide hotline was discussed.   Treatment Plan Summary: Medication  management  Norman Clay, MD 12/16/2015, 11:59 AM

## 2015-12-16 ENCOUNTER — Encounter (HOSPITAL_COMMUNITY): Payer: Self-pay | Admitting: Psychiatry

## 2015-12-16 ENCOUNTER — Ambulatory Visit (INDEPENDENT_AMBULATORY_CARE_PROVIDER_SITE_OTHER): Payer: Medicare Other | Admitting: Psychiatry

## 2015-12-16 VITALS — BP 105/77 | HR 95 | Ht 70.0 in | Wt 211.4 lb

## 2015-12-16 DIAGNOSIS — Z8489 Family history of other specified conditions: Secondary | ICD-10-CM | POA: Diagnosis not present

## 2015-12-16 DIAGNOSIS — Z888 Allergy status to other drugs, medicaments and biological substances status: Secondary | ICD-10-CM

## 2015-12-16 DIAGNOSIS — I2510 Atherosclerotic heart disease of native coronary artery without angina pectoris: Secondary | ICD-10-CM

## 2015-12-16 DIAGNOSIS — Z880 Allergy status to penicillin: Secondary | ICD-10-CM

## 2015-12-16 DIAGNOSIS — R45851 Suicidal ideations: Secondary | ICD-10-CM

## 2015-12-16 DIAGNOSIS — F331 Major depressive disorder, recurrent, moderate: Secondary | ICD-10-CM | POA: Diagnosis not present

## 2015-12-16 DIAGNOSIS — Z79899 Other long term (current) drug therapy: Secondary | ICD-10-CM

## 2015-12-16 DIAGNOSIS — Z7982 Long term (current) use of aspirin: Secondary | ICD-10-CM

## 2015-12-16 DIAGNOSIS — Z9889 Other specified postprocedural states: Secondary | ICD-10-CM | POA: Diagnosis not present

## 2015-12-16 MED ORDER — ARIPIPRAZOLE 2 MG PO TABS: 2.0000 mg | ORAL_TABLET | Freq: Every day | ORAL | 1 refills | Status: DC

## 2015-12-16 MED ORDER — MIRTAZAPINE 30 MG PO TABS: 30.0000 mg | ORAL_TABLET | Freq: Every day | ORAL | 1 refills | Status: DC

## 2015-12-16 NOTE — Patient Instructions (Signed)
1. Continue mirtazapine 30 mg at night 2. Start Abilify 2 mg daily 3. Return to clinic in January

## 2015-12-19 ENCOUNTER — Other Ambulatory Visit: Payer: Medicare Other | Admitting: *Deleted

## 2015-12-19 DIAGNOSIS — E785 Hyperlipidemia, unspecified: Secondary | ICD-10-CM | POA: Diagnosis not present

## 2015-12-19 LAB — LIPID PANEL
CHOLESTEROL: 174 mg/dL (ref <200)
HDL: 28 mg/dL — ABNORMAL LOW (ref >40)
Total CHOL/HDL Ratio: 6.2 Ratio — ABNORMAL HIGH (ref <5.0)
Triglycerides: 426 mg/dL — ABNORMAL HIGH (ref <150)

## 2015-12-19 LAB — HEPATIC FUNCTION PANEL
ALK PHOS: 63 U/L (ref 40–115)
ALT: 20 U/L (ref 9–46)
AST: 20 U/L (ref 10–35)
Albumin: 4.4 g/dL (ref 3.6–5.1)
BILIRUBIN DIRECT: 0.1 mg/dL (ref <=0.2)
BILIRUBIN INDIRECT: 0.3 mg/dL (ref 0.2–1.2)
BILIRUBIN TOTAL: 0.4 mg/dL (ref 0.2–1.2)
Total Protein: 7.1 g/dL (ref 6.1–8.1)

## 2015-12-21 ENCOUNTER — Other Ambulatory Visit: Payer: Self-pay | Admitting: *Deleted

## 2015-12-21 DIAGNOSIS — E7849 Other hyperlipidemia: Secondary | ICD-10-CM

## 2015-12-23 ENCOUNTER — Telehealth: Payer: Self-pay | Admitting: Family Medicine

## 2015-12-23 MED ORDER — ZOLPIDEM TARTRATE 10 MG PO TABS: 10.0000 mg | ORAL_TABLET | Freq: Every evening | ORAL | 0 refills | Status: DC | PRN

## 2015-12-23 NOTE — Telephone Encounter (Signed)
I do not like Ambien. You may refill this 1 but tell the patient to take as little as possible like one half nightly instead of a whole nightly.

## 2015-12-23 NOTE — Telephone Encounter (Signed)
Pt notified of refill on Zolpidem  Called to [facility] per Dr Laurance Flatten

## 2015-12-23 NOTE — Telephone Encounter (Signed)
Pt having some problems with constipation Will try Miralax Is scheduled for colonoscopy next week Also requesting refill on Ambien Please advise

## 2015-12-26 ENCOUNTER — Encounter (HOSPITAL_COMMUNITY): Payer: Self-pay | Admitting: Psychiatry

## 2015-12-26 ENCOUNTER — Other Ambulatory Visit (HOSPITAL_COMMUNITY): Payer: Self-pay | Admitting: Psychiatry

## 2015-12-26 ENCOUNTER — Ambulatory Visit (INDEPENDENT_AMBULATORY_CARE_PROVIDER_SITE_OTHER): Payer: Medicare Other | Admitting: Psychiatry

## 2015-12-26 ENCOUNTER — Telehealth (INDEPENDENT_AMBULATORY_CARE_PROVIDER_SITE_OTHER): Payer: Self-pay | Admitting: *Deleted

## 2015-12-26 ENCOUNTER — Telehealth (HOSPITAL_COMMUNITY): Payer: Self-pay | Admitting: *Deleted

## 2015-12-26 DIAGNOSIS — F331 Major depressive disorder, recurrent, moderate: Secondary | ICD-10-CM | POA: Diagnosis not present

## 2015-12-26 MED ORDER — BUPROPION HCL ER (XL) 150 MG PO TB24: 150.0000 mg | ORAL_TABLET | Freq: Every day | ORAL | 1 refills | Status: DC

## 2015-12-26 NOTE — Telephone Encounter (Signed)
Patient has not started Abilify due to cost issues. (The pharmacist has not received an appropriate insurance card in file). After having discussion, patient is interested in restarting wellbutrin. Will start this medication  Discussed side effects which includes headache and insomnia.  - Start wellbutrin XR 150 mg daily - continue mirtazapine 30 mg qhs - discontinue Abilify

## 2015-12-26 NOTE — Telephone Encounter (Signed)
Pt came into office to see another provider. Per pt, he would like to know what happened with office responding back to him about his Abilify? Informed pt that the last time RMA spoke with him, he informed RMA that his pharmacy is telling him that his Abilify medication is going to cost him a little over $700 and he do not have the money for that. RMA then instructed pt to have his pharmacy send PA to office because it sounds like he needs a PA and if the pharmacy have any problems to have them call office and to also make sure he have the right insurance on file with his pharmacy. At the time pt was on the phone pt verbalized understanding. Per pt he called his insurance company and they will not pay for his Abilify. Per pt he might as well go back on his Wellbutrin although he told Dr. Modesta Messing that is did not work. Per pt he took insurance card to his pharmacy and they also told him that his insurance don't pay for the Abilify and they only wants to pay for his diabetes meds. RMA then called pt pharmacy ([facility] in [place]) which was the pharmacy pt verbalized that office should call, and spoke with Hinton Dyer. Per Hinton Dyer, pt have several cards on file and the only one that is similar or close to being an insurance is AAP. Per Hinton Dyer when they run this card a message comes back stating non match card holder ID (which means pt do not have coverage). Hinton Dyer then stated that pt also have [place], PDMI, QCA and P[employer] and all of those cards are discount savings card because when ever she tries to run those cards it only takes $1.00 off the total balance each. Asked Hinton Dyer if in their record/pt profile if he brought his medicare card to them and Hinton Dyer stated no. Per Hinton Dyer they do not have no other insurance card on file other then AAP and can not find anywhere on pt profile any medicare card. When RMA got off the phone with Hinton Dyer at the pharmacy, [place] informed pt that pharmacy is saying they do not have any Medicare card on file and the  only cards they have on file are discount cards. Pt then stated he know and he already told RMA that. RMA then stated asked pt if he had his Medicare card on him right now and pt stated no and did not say anything else. Then RMA asked pt if he know where his Medicare card was and pt stated yes it's in his car. RMA then asked pt if he could go to his car and bring his card upstairs to office and pt then stated no and stated it's just his social number anyway's. RMA then tried to tell pt that taking his Medicare card to his insurance is very important and in order for office to phone if Medicare will pay for this medication he would have to take the care to the pharmacy. Pt then stated "I already told you I called the insurance they told me the only medication they will pay for is my diabetes meds and they will not pay for my Abilify. Per pt I might as well just give up on taking this Abilify because I don't feel like fooling around with this anymore. Per pt just tell Dr. Modesta Messing to put me bsck on Wellbutrin although it does not work. RMA then tried to encourage pt to not give up on trying to take  the Abilify. RMA then reminded pt that as soon as he leaves office to please take his Medicare card to his pharmacy. Per pt, "I already took it to them once and I'm not taking it up there right now, I'll take it up there when I have the chance to go by there". Then pt stated you know what I'm not fooling with this anymore and then pt started stating that office should have had this token care of and and he's not going to the pharmacy anymore and if he thinks about it he'll do it on his time. Pt then stated getting more louder with RMA and RMA then calmly asked pt to please have a seat for a minute and pt forcefully stated "No I will not have a seat and I'm leaving" and walked away from the front desk and walked out. 5 mins later pt walked back in office and sat down. RMA then called Dr. Modesta Messing and informed her with what just  happened and asked if she could speak with pt because pt is not trying to hear what RMA is trying to say right now if if he hears it coming form her he might take it into consideration. Provider agreed and RMA handed pt the phone. When pt was done talking with provider, pt handed the phone back to RMA and stated thank you and walked out.

## 2015-12-26 NOTE — Telephone Encounter (Signed)
Patient needs you to call him, real constipated, TCS sch'd 12/29/15

## 2015-12-26 NOTE — Telephone Encounter (Signed)
Advised him to get an enema over the counter and then call me  With results
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
## 2015-12-27 ENCOUNTER — Other Ambulatory Visit: Payer: Self-pay

## 2015-12-29 ENCOUNTER — Encounter (HOSPITAL_COMMUNITY)
Admission: RE | Disposition: A | Discharge status: Home / Self Care | Payer: Self-pay | Source: Ambulatory Visit | Attending: Internal Medicine

## 2015-12-29 ENCOUNTER — Ambulatory Visit (HOSPITAL_COMMUNITY)
Admission: RE | Admit: 2015-12-29 | Discharge: 2015-12-29 | Disposition: A | Discharge status: Home / Self Care | Payer: Medicare Other | Source: Ambulatory Visit | Attending: Internal Medicine | Admitting: Internal Medicine

## 2015-12-29 ENCOUNTER — Encounter (HOSPITAL_COMMUNITY): Payer: Self-pay | Admitting: *Deleted

## 2015-12-29 DIAGNOSIS — E785 Hyperlipidemia, unspecified: Secondary | ICD-10-CM | POA: Insufficient documentation

## 2015-12-29 DIAGNOSIS — K641 Second degree hemorrhoids: Secondary | ICD-10-CM | POA: Diagnosis not present

## 2015-12-29 DIAGNOSIS — D122 Benign neoplasm of ascending colon: Secondary | ICD-10-CM | POA: Diagnosis not present

## 2015-12-29 DIAGNOSIS — D124 Benign neoplasm of descending colon: Secondary | ICD-10-CM | POA: Insufficient documentation

## 2015-12-29 DIAGNOSIS — I2510 Atherosclerotic heart disease of native coronary artery without angina pectoris: Secondary | ICD-10-CM | POA: Diagnosis not present

## 2015-12-29 DIAGNOSIS — K625 Hemorrhage of anus and rectum: Secondary | ICD-10-CM | POA: Insufficient documentation

## 2015-12-29 DIAGNOSIS — K5900 Constipation, unspecified: Secondary | ICD-10-CM | POA: Insufficient documentation

## 2015-12-29 DIAGNOSIS — F17210 Nicotine dependence, cigarettes, uncomplicated: Secondary | ICD-10-CM | POA: Insufficient documentation

## 2015-12-29 DIAGNOSIS — Z7982 Long term (current) use of aspirin: Secondary | ICD-10-CM | POA: Insufficient documentation

## 2015-12-29 DIAGNOSIS — I252 Old myocardial infarction: Secondary | ICD-10-CM | POA: Diagnosis not present

## 2015-12-29 DIAGNOSIS — K921 Melena: Secondary | ICD-10-CM | POA: Diagnosis not present

## 2015-12-29 DIAGNOSIS — Z96641 Presence of right artificial hip joint: Secondary | ICD-10-CM | POA: Diagnosis not present

## 2015-12-29 DIAGNOSIS — Z79899 Other long term (current) drug therapy: Secondary | ICD-10-CM | POA: Diagnosis not present

## 2015-12-29 DIAGNOSIS — Z955 Presence of coronary angioplasty implant and graft: Secondary | ICD-10-CM | POA: Insufficient documentation

## 2015-12-29 DIAGNOSIS — I10 Essential (primary) hypertension: Secondary | ICD-10-CM | POA: Diagnosis not present

## 2015-12-29 DIAGNOSIS — F329 Major depressive disorder, single episode, unspecified: Secondary | ICD-10-CM | POA: Insufficient documentation

## 2015-12-29 DIAGNOSIS — K644 Residual hemorrhoidal skin tags: Secondary | ICD-10-CM | POA: Insufficient documentation

## 2015-12-29 DIAGNOSIS — R195 Other fecal abnormalities: Secondary | ICD-10-CM | POA: Diagnosis not present

## 2015-12-29 DIAGNOSIS — K5730 Diverticulosis of large intestine without perforation or abscess without bleeding: Secondary | ICD-10-CM | POA: Diagnosis not present

## 2015-12-29 HISTORY — PX: COLONOSCOPY: SHX5424

## 2015-12-29 HISTORY — PX: POLYPECTOMY: SHX5525

## 2015-12-29 SURGERY — COLONOSCOPY
Anesthesia: Moderate Sedation

## 2015-12-29 MED ORDER — SODIUM CHLORIDE 0.9 % IV SOLN
INTRAVENOUS | Status: DC
  Administered 2015-12-29: 12:00:00 via INTRAVENOUS

## 2015-12-29 MED ORDER — MIDAZOLAM HCL 5 MG/5ML IJ SOLN
INTRAMUSCULAR | Status: DC | PRN
  Administered 2015-12-29: 2 mg via INTRAVENOUS
  Administered 2015-12-29: 1 mg via INTRAVENOUS
  Administered 2015-12-29: 2 mg via INTRAVENOUS
  Administered 2015-12-29: 1 mg via INTRAVENOUS

## 2015-12-29 MED ORDER — STERILE WATER FOR IRRIGATION IR SOLN
Status: DC | PRN
  Administered 2015-12-29: 2.5 mL

## 2015-12-29 MED ORDER — MEPERIDINE HCL 50 MG/ML IJ SOLN
INTRAMUSCULAR | Status: DC | PRN
  Administered 2015-12-29 (×2): 25 mg via INTRAVENOUS

## 2015-12-29 MED ORDER — MEPERIDINE HCL 50 MG/ML IJ SOLN
INTRAMUSCULAR | Status: AC
  Filled 2015-12-29: qty 1

## 2015-12-29 MED ORDER — MIDAZOLAM HCL 5 MG/5ML IJ SOLN
INTRAMUSCULAR | Status: AC
  Filled 2015-12-29: qty 10

## 2015-12-29 NOTE — H&P (Signed)
Anthony Form. is an 65 y.o. male.   Chief Complaint: Patient is here for colonoscopy. HPI: Patient is 65 year old Caucasian male who has had 2 episodes of rectal bleeding. Past small amount of fresh blood. He was noted to have heme-positive stool. Lately he has had constipation. He denies abdominal pain anorexia weight loss. He has never been screened for CRC. He is on low-dose aspirin. He may also use Aleve once or twice a week but no more. Family history is negative for CRC.  Past Medical History:  Diagnosis Date  . Anxiety   . Arthritis   . Bipolar disorder ([place])   . CAD (coronary artery disease)    a. INF STEMI 07/04/10:  tx with thrombectomy + [facility] to [facility];  cath 07/04/10: dLM 10-20%, pLAD 40-50%, mLAD 20-30%, pRCA 30%, mRCA occluded and tx with PCI, EF 50% with inf HK. A Multilink  . COPD (chronic obstructive pulmonary disease) ([place])   . Depression   . Glucose intolerance (impaired glucose tolerance)    A1c 6.2 06/2010  . History of DVT (deep vein thrombosis)    traumatic, s/p coumadin tx.  Marland Kitchen HLD (hyperlipidemia)   . Hypertension    pt states is currently on no medications  . Myocardial infarction    around 2013  . Shortness of breath    walking distance or climbing stairs  . Tobacco abuse   . Urinary frequency     Past Surgical History:  Procedure Laterality Date  . CARDIAC CATHETERIZATION    . ELECTROCARDIOGRAM     Showed inferolateral ST-elevation and a code STEMI was activated. In the ER, he was treated with morphine, herarin, and 600 mg of Plavix. He was transferred emergently to [facility].   . INGUINAL HERNIA REPAIR Left 07/26/2014   Procedure: OPEN REPAIR OF RECURENT LEFT INGUINAL HERNIA REPAIR WITH MESH;  Surgeon: Greer Pickerel, MD;  Location: [facility];  Service: General;  Laterality: Left;  . INSERTION OF MESH Bilateral 10/30/2013   Procedure: INSERTION OF MESH;  Surgeon: Gayland Curry, MD;  Location: [facility];  Service: General;  Laterality: Bilateral;   . LAPAROSCOPIC INGUINAL HERNIA WITH UMBILICAL HERNIA Bilateral 10/30/2013   Procedure: laparoscopic repair left pantaloom hernia with mesh, laparoscopic right inguinal hernia with mesh, OPEN REPAIR OF UMBILICAL HERNIA ;  Surgeon: Gayland Curry, MD;  Location: [facility];  Service: General;  Laterality: Bilateral;  . LAPAROSCOPY N/A 07/26/2014   Procedure: LAPAROSCOPY DIAGNOSTIC;  Surgeon: Greer Pickerel, MD;  Location: [facility];  Service: General;  Laterality: N/A;  . stent placement    . TOTAL HIP ARTHROPLASTY Right 05/10/2015   Procedure: RIGHT TOTAL HIP ARTHROPLASTY ANTERIOR APPROACH;  Surgeon: Paralee Cancel, MD;  Location: [facility];  Service: Orthopedics;  Laterality: Right;  Marland Kitchen VASECTOMY      Family History  Problem Relation Age of Onset  . Coronary artery disease Father   . Depression Father   . Depression Mother    Social History:  reports that he has been smoking Cigarettes.  He has a 90.00 pack-year smoking history. He has never used smokeless tobacco. He reports that he does not drink alcohol or use drugs.  Allergies:  Allergies  Allergen Reactions  . Flomax [Tamsulosin Hcl] Other (See Comments)    This medication makes patient feel sick  . Penicillins Other (See Comments)    Reaction unknown occurred during childhood Has patient had a PCN reaction causing immediate rash, facial/tongue/throat swelling, SOB or lightheadedness with  hypotension: unsure - childhood reaction Has patient had a PCN reaction causing severe rash involving mucus membranes or skin necrosis: unsure - childhood reaction Has patient had a PCN reaction that required hospitalization no Has patient had a PCN reaction occurring within the last 10 years: no If all of the above answers are "NO", then may p    Facility-Administered Medications Prior to Admission  Medication Dose Route Frequency Provider Last Rate Last Dose  . testosterone cypionate (DEPOTESTOSTERONE CYPIONATE) injection 200 mg  200 mg Intramuscular Q28 days  Wardell Honour, MD   200 mg at 10/25/15 1556   Medications Prior to Admission  Medication Sig Dispense Refill  . aspirin EC 81 MG tablet Take 1 tablet (81 mg total) by mouth daily. 90 tablet 3  . lovastatin (MEVACOR) 20 MG tablet Take 1 tablet (20 mg total) by mouth at bedtime. 30 tablet 11  . MELATONIN PO Take 2 tablets by mouth at bedtime as needed (sleep).    . metoprolol tartrate (LOPRESSOR) 25 MG tablet Take 0.5 tablets (12.5 mg total) by mouth 2 (two) times daily. 30 tablet 11  . mirtazapine (REMERON) 30 MG tablet Take 1 tablet (30 mg total) by mouth at bedtime. 30 tablet 1  . multivitamin-lutein (OCUVITE-LUTEIN) CAPS capsule Take 1 capsule by mouth every other day.    . naproxen sodium (ANAPROX) 220 MG tablet Take 220 mg by mouth 2 (two) times daily as needed (pain).     . psyllium (METAMUCIL) 58.6 % powder Take 1 packet by mouth daily as needed (constipation).    . sennosides-docusate sodium (SENOKOT-S) 8.6-50 MG tablet Take 1 tablet by mouth daily as needed for constipation.    . sodium chloride (OCEAN) 0.65 % SOLN nasal spray Place 1 spray into both nostrils as needed for congestion.    Marland Kitchen tetrahydrozoline 0.05 % ophthalmic solution Place 2-3 drops into both eyes 4 (four) times daily as needed (For eye redness.).    Marland Kitchen traMADol (ULTRAM) 50 MG tablet Take 50 mg by mouth every 12 (twelve) hours as needed for moderate pain or severe pain.    Marland Kitchen VALERIAN ROOT PO Take 1 capsule by mouth daily as needed (sleep).    . zolpidem (AMBIEN) 10 MG tablet Take 1 tablet (10 mg total) by mouth at bedtime as needed for sleep. 30 tablet 0  . ARIPiprazole (ABILIFY) 2 MG tablet Take 1 tablet (2 mg total) by mouth daily. (Patient not taking: Reported on 12/26/2015) 30 tablet 1  . buPROPion (WELLBUTRIN XL) 150 MG 24 hr tablet Take 1 tablet (150 mg total) by mouth daily. 30 tablet 1  . glucosamine-chondroitin 500-400 MG tablet Take 1 tablet by mouth as needed (joint pain).       No results found for this or  any previous visit (from the past 48 hour(s)). No results found.  ROS  Blood pressure 108/72, pulse 69, temperature 98.3 F (36.8 C), temperature source Oral, resp. rate 12, height 5\' 9"  (1.753 m), weight 205 lb (93 kg), SpO2 95 %. Physical Exam  Constitutional: He appears well-developed and well-nourished.  HENT:  Mouth/Throat: Oropharynx is clear and moist.  Eyes: Conjunctivae are normal. No scleral icterus.  Neck: No thyromegaly present.  Cardiovascular: Normal rate, regular rhythm and normal heart sounds.   No murmur heard. Respiratory: Effort normal and breath sounds normal.  GI:  Abdomen is full but soft with mild tenderness at LLQ. No organomegaly or masses.  Musculoskeletal: He exhibits no edema.  Lymphadenopathy:  He has no cervical adenopathy.  Neurological: He is alert.  Skin: Skin is warm and dry.     Assessment/Plan Hematochezia and heme-positive stool. Diagnostic colonoscopy.  Hildred Laser, MD 12/29/2015, 12:29 PM

## 2015-12-29 NOTE — Discharge Instructions (Signed)
Resume aspirin on 01/01/2016. Resume other medications as before. High fiber diet. No driving for 24 hours. Physician will call with biopsy results.     Colonoscopy, Adult, Care After This sheet gives you information about how to care for yourself after your procedure. Your doctor may also give you more specific instructions. If you have problems or questions, call your doctor. Follow these instructions at home: General instructions  For the first 24 hours after the procedure:  Do not drive or use machinery.  Do not sign important documents.  Do not drink alcohol.  Do your daily activities more slowly than normal.  Eat foods that are soft and easy to digest.  Rest often.  Take over-the-counter or prescription medicines only as told by your doctor.  It is up to you to get the results of your procedure. Ask your doctor, or the department performing the procedure, when your results will be ready. To help cramping and bloating:  Try walking around.  Put heat on your belly (abdomen) as told by your doctor. Use a heat source that your doctor recommends, such as a moist heat pack or a heating pad.  Put a towel between your skin and the heat source.  Leave the heat on for 20-30 minutes.  Remove the heat if your skin turns bright red. This is especially important if you cannot feel pain, heat, or cold. You can get burned. Eating and drinking  Drink enough fluid to keep your pee (urine) clear or pale yellow.  Return to your normal diet as told by your doctor. Avoid heavy or fried foods that are hard to digest.  Avoid drinking alcohol for as long as told by your doctor. Contact a doctor if:  You have blood in your poop (stool) 2-3 days after the procedure. Get help right away if:  You have more than a small amount of blood in your poop.  You see large clumps of tissue (blood clots) in your poop.  Your belly is swollen.  You feel sick to your stomach (nauseous).  You  throw up (vomit).  You have a fever.  You have belly pain that gets worse, and medicine does not help your pain. This information is not intended to replace advice given to you by your health care provider. Make sure you discuss any questions you have with your health care provider. Document Released: 01/27/2010 Document Revised: 09/19/2015 Document Reviewed: 09/19/2015 Elsevier Interactive Patient Education  2017 [place].    Colon Polyps Introduction Polyps are tissue growths inside the body. Polyps can grow in many places, including the large intestine (colon). A polyp may be a round bump or a mushroom-shaped growth. You could have one polyp or several. Most colon polyps are noncancerous (benign). However, some colon polyps can become cancerous over time. What are the causes? The exact cause of colon polyps is not known. What increases the risk? This condition is more likely to develop in people who:  Have a family history of colon cancer or colon polyps.  Are older than 66 or older than 45 if they are African American.  Have inflammatory bowel disease, such as ulcerative colitis or Crohn disease.  Are overweight.  Smoke cigarettes.  Do not get enough exercise.  Drink too much alcohol.  Eat a diet that is:  High in fat and red meat.  Low in fiber.  Had childhood cancer that was treated with abdominal radiation. What are the signs or symptoms? Most polyps do not cause  symptoms. If you have symptoms, they may include:  Blood coming from your rectum when having a bowel movement.  Blood in your stool.The stool may look dark red or black.  A change in bowel habits, such as constipation or diarrhea. How is this diagnosed? This condition is diagnosed with a colonoscopy. This is a procedure that uses a lighted, flexible scope to look at the inside of your colon. How is this treated? Treatment for this condition involves removing any polyps that are found. Those  polyps will then be tested for cancer. If cancer is found, your health care provider will talk to you about options for colon cancer treatment. Follow these instructions at home: Diet  Eat plenty of fiber, such as fruits, vegetables, and whole grains.  Eat foods that are high in calcium and vitamin D, such as milk, cheese, yogurt, eggs, liver, fish, and broccoli.  Limit foods high in fat, red meats, and processed meats, such as hot dogs, sausage, bacon, and lunch meats.  Maintain a healthy weight, or lose weight if recommended by your health care provider. General instructions  Do not smoke cigarettes.  Do not drink alcohol excessively.  Keep all follow-up visits as told by your health care provider. This is important. This includes keeping regularly scheduled colonoscopies. Talk to your health care provider about when you need a colonoscopy.  Exercise every day or as told by your health care provider. Contact a health care provider if:  You have new or worsening bleeding during a bowel movement.  You have new or increased blood in your stool.  You have a change in bowel habits.  You unexpectedly lose weight. This information is not intended to replace advice given to you by your health care provider. Make sure you discuss any questions you have with your health care provider. Document Released: 09/21/2003 Document Revised: 06/02/2015 Document Reviewed: 11/15/2014  2017 Elsevier   High-Fiber Diet Fiber, also called dietary fiber, is a type of carbohydrate found in fruits, vegetables, whole grains, and beans. A high-fiber diet can have many health benefits. Your health care provider may recommend a high-fiber diet to help:  Prevent constipation. Fiber can make your bowel movements more regular.  Lower your cholesterol.  Relieve hemorrhoids, uncomplicated diverticulosis, or irritable bowel syndrome.  Prevent overeating as part of a weight-loss plan.  Prevent heart disease,  type 2 diabetes, and certain cancers. What is my plan? The recommended daily intake of fiber includes:  38 grams for men under age 55.  69 grams for men over age 36.  85 grams for women under age 19.  55 grams for women over age 90. You can get the recommended daily intake of dietary fiber by eating a variety of fruits, vegetables, grains, and beans. Your health care provider may also recommend a fiber supplement if it is not possible to get enough fiber through your diet. What do I need to know about a high-fiber diet?  Fiber supplements have not been widely studied for their effectiveness, so it is better to get fiber through food sources.  Always check the fiber content on thenutrition facts label of any prepackaged food. Look for foods that contain at least 5 grams of fiber per serving.  Ask your dietitian if you have questions about specific foods that are related to your condition, especially if those foods are not listed in the following section.  Increase your daily fiber consumption gradually. Increasing your intake of dietary fiber too quickly may cause bloating,  cramping, or gas.  Drink plenty of water. Water helps you to digest fiber. What foods can I eat? Grains  Whole-grain breads. Multigrain cereal. Oats and oatmeal. Brown rice. Barley. Bulgur wheat. [place]. Bran muffins. Popcorn. Rye wafer crackers. Vegetables  Sweet potatoes. Spinach. Kale. Artichokes. Cabbage. Broccoli. Green peas. Carrots. Squash. Fruits  Berries. Pears. Apples. Oranges. Avocados. Prunes and raisins. Dried figs. Meats and Other Protein Sources  [place], kidney, pinto, and soy beans. Split peas. Lentils. Nuts and seeds. Dairy  Fiber-fortified yogurt. Beverages  Fiber-fortified soy milk. Fiber-fortified orange juice. Other  Fiber bars. The items listed above may not be a complete list of recommended foods or beverages. Contact your dietitian for more options.  What foods are not  recommended? Grains  White bread. Pasta made with refined flour. White rice. Vegetables  Fried potatoes. Canned vegetables. Well-cooked vegetables. Fruits  Fruit juice. Cooked, strained fruit. Meats and Other Protein Sources  Fatty cuts of meat. Fried Sales executive or fried fish. Dairy  Milk. Yogurt. Cream cheese. Sour cream. Beverages  Soft drinks. Other  Cakes and pastries. Butter and oils. The items listed above may not be a complete list of foods and beverages to avoid. Contact your dietitian for more information.  What are some tips for including high-fiber foods in my diet?  Eat a wide variety of high-fiber foods.  Make sure that half of all grains consumed each day are whole grains.  Replace breads and cereals made from refined flour or white flour with whole-grain breads and cereals.  Replace white rice with brown rice, bulgur wheat, or millet.  Start the day with a breakfast that is high in fiber, such as a cereal that contains at least 5 grams of fiber per serving.  Use beans in place of meat in soups, salads, or pasta.  Eat high-fiber snacks, such as berries, raw vegetables, nuts, or popcorn. This information is not intended to replace advice given to you by your health care provider. Make sure you discuss any questions you have with your health care provider. Document Released: 12/25/2004 Document Revised: 06/02/2015 Document Reviewed: 06/09/2013 Elsevier Interactive Patient Education  2017 [employer].

## 2015-12-29 NOTE — Op Note (Signed)
[facility] Patient Name: Anthony [name] Procedure Date: 12/29/2015 10:24 AM MRN: [id number] Date of Birth: 1950-06-30 Attending MD: Hildred Laser , MD CSN: [id number] Age: 65 Admit Type: Outpatient Procedure:                Colonoscopy Indications:              Hematochezia, Heme positive stool Providers:                Hildred Laser, MD, Charlyne Petrin RN, RN, Randa Spike, Technician, Aram Candela Referring MD:             Lillette Boxer. Sabra Heck, MD Medicines:                Meperidine 50 mg IV, Midazolam 6 mg IV Complications:            No immediate complications. Estimated Blood Loss:     Estimated blood loss was minimal. Procedure:                Pre-Anesthesia Assessment:                           - Prior to the procedure, a History and Physical                            was performed, and patient medications and                            allergies were reviewed. The patient's tolerance of                            previous anesthesia was also reviewed. The risks                            and benefits of the procedure and the sedation                            options and risks were discussed with the patient.                            All questions were answered, and informed consent                            was obtained. Prior Anticoagulants: The patient                            last took aspirin 2 days prior to the procedure.                            ASA Grade Assessment: II - A patient with mild                            systemic disease. After reviewing the risks and  benefits, the patient was deemed in satisfactory                            condition to undergo the procedure.                           After obtaining informed consent, the colonoscope                            was passed under direct vision. Throughout the                            procedure, the patient's blood pressure, pulse, and                             oxygen saturations were monitored continuously. The                            EC-[id number]) scope was introduced through                            the anus and advanced to the the cecum, identified                            by appendiceal orifice and ileocecal valve. The                            colonoscopy was performed without difficulty. The                            patient tolerated the procedure well. The quality                            of the bowel preparation was adequate to identify                            polyps 6 mm and larger in size. The ileocecal                            valve, appendiceal orifice, and rectum were                            photographed. Findings:      Three sessile polyps were found in the ascending colon. The polyps were       5 to 8 mm in size. These polyps were removed with a cold snare.       Resection and retrieval were complete. The pathology specimen was placed       into Bottle Number 1.      A 4 mm polyp was found in the descending colon. The polyp was sessile.       Biopsies were taken with a cold forceps for histology. The pathology       specimen was placed into Bottle Number 2.      A few medium-mouthed diverticula were found in  the sigmoid colon.      External hemorrhoids were found during retroflexion. The hemorrhoids       were small. Impression:               - Three 5 to 8 mm polyps in the ascending colon,                            removed with a cold snare. Resected and retrieved.                           - One 4 mm polyp in the descending colon. Biopsied.                           - Diverticulosis in the sigmoid colon.                           - External hemorrhoids. Moderate Sedation:      Moderate (conscious) sedation was administered by the endoscopy nurse       and supervised by the endoscopist. The following parameters were       monitored: oxygen saturation, heart rate, blood  pressure, CO2       capnography and response to care. Total physician intraservice time was       37 minutes. Recommendation:           - Patient has a contact number available for                            emergencies. The signs and symptoms of potential                            delayed complications were discussed with the                            patient. Return to normal activities tomorrow.                            Written discharge instructions were provided to the                            patient.                           - High fiber diet today.                           - Continue present medications.                           - No aspirin, ibuprofen, naproxen, or other                            non-steroidal anti-inflammatory drugs for 3 days                            after polyp removal.                           -  Await pathology results.                           - Repeat colonoscopy date to be determined after                            pending pathology results are reviewed. Procedure Code(s):        --- Professional ---                           [phone], Colonoscopy, flexible; with removal of                            tumor(s), polyp(s), or other lesion(s) by snare                            technique                           45380, 59, Colonoscopy, flexible; with biopsy,                            single or multiple                           99152, Moderate sedation services provided by the                            same physician or other qualified health care                            professional performing the diagnostic or                            therapeutic service that the sedation supports,                            requiring the presence of an independent trained                            observer to assist in the monitoring of the                            patient's level of consciousness and physiological                            status;  initial 15 minutes of intraservice time,                            patient age 58 years or older                           99153, Moderate sedation services; each additional                            15 minutes  intraservice time Diagnosis Code(s):        --- Professional ---                           D12.2, Benign neoplasm of ascending colon                           D12.4, Benign neoplasm of descending colon                           K64.4, Residual hemorrhoidal skin tags                           K92.1, Melena (includes Hematochezia)                           R19.5, Other fecal abnormalities                           K57.30, Diverticulosis of large intestine without                            perforation or abscess without bleeding CPT copyright 2016 American Medical Association. All rights reserved. The codes documented in this report are preliminary and upon coder review may  be revised to meet current compliance requirements. Hildred Laser, MD Hildred Laser, MD 12/29/2015 1:25:03 PM This report has been signed electronically. Number of Addenda: 0

## 2016-01-03 ENCOUNTER — Other Ambulatory Visit: Payer: Medicare Other | Admitting: *Deleted

## 2016-01-03 ENCOUNTER — Encounter (HOSPITAL_COMMUNITY): Payer: Self-pay | Admitting: Internal Medicine

## 2016-01-03 DIAGNOSIS — E7849 Other hyperlipidemia: Secondary | ICD-10-CM

## 2016-01-03 DIAGNOSIS — E784 Other hyperlipidemia: Secondary | ICD-10-CM | POA: Diagnosis not present

## 2016-01-03 LAB — LIPID PANEL
CHOL/HDL RATIO: 6.3 ratio — AB (ref <5.0)
CHOLESTEROL: 183 mg/dL (ref <200)
HDL: 29 mg/dL — ABNORMAL LOW (ref >40)
LDL Cholesterol: 89 mg/dL (ref <100)
TRIGLYCERIDES: 325 mg/dL — AB (ref <150)
VLDL: 65 mg/dL — AB (ref <30)

## 2016-01-05 ENCOUNTER — Telehealth: Payer: Self-pay | Admitting: Cardiovascular Disease

## 2016-01-05 DIAGNOSIS — E7849 Other hyperlipidemia: Secondary | ICD-10-CM

## 2016-01-05 MED ORDER — LOVASTATIN 40 MG PO TABS: 40.0000 mg | ORAL_TABLET | Freq: Every day | ORAL | 11 refills | Status: DC

## 2016-01-05 NOTE — Telephone Encounter (Signed)
I spoke with pt and reviewed lab results with him.  I will send new prescription for Lovastatin 40 mg to [facility] on [address].  Pt will come in for fasting lab work on 03/27/16

## 2016-01-05 NOTE — Telephone Encounter (Signed)
Anthony [name] is returning your call. Please call. Thanks.

## 2016-01-13 ENCOUNTER — Other Ambulatory Visit (HOSPITAL_COMMUNITY): Payer: Self-pay | Admitting: General Surgery

## 2016-01-13 DIAGNOSIS — R1909 Other intra-abdominal and pelvic swelling, mass and lump: Secondary | ICD-10-CM | POA: Diagnosis not present

## 2016-01-16 ENCOUNTER — Other Ambulatory Visit (HOSPITAL_COMMUNITY): Payer: Self-pay | Admitting: General Surgery

## 2016-01-17 ENCOUNTER — Telehealth (HOSPITAL_COMMUNITY): Payer: Self-pay | Admitting: *Deleted

## 2016-01-17 NOTE — Telephone Encounter (Signed)
voice message from patient, said he need refill of Remeron to be call to [facility].

## 2016-01-17 NOTE — Progress Notes (Signed)
Subjective:    Patient ID: Anthony Form., male    DOB: 1950/11/20, 66 y.o.   MRN: [id number]  HPI 66 year old gentleman who is here primarily to talk. He has lots of chronic symptoms that typically relate to depression. He is seeing psychiatrist and psychologist now. He had a recent issue with some lower GI bleeding and a polyp was removed at colonoscopy. He has had no further bleeding. He does complain today of some dry eyes. I reviewed his medicines and looks like only the Remeron may have some drying effect as far as his eyes. We talked about using some artificial tears or drops for the dry  Patient Active Problem List   Diagnosis Date Noted  . Rectal bleeding 11/23/2015  . Moderate episode of recurrent major depressive disorder ([place]) 10/18/2015  . Overweight (BMI 25.0-29.9) 05/11/2015  . S/P right THA, AA 05/10/2015  . Recurrent left inguinal hernia 07/26/2014  . Chronic right hip pain 06/23/2014  . S/P bilateral inguinal hernia repair 10/30/2013  . Nodule of left lung 05/29/2013  . Bilateral inguinal hernia 05/28/2013  . Umbilical hernia [id number]  . Major psychotic depression, recurrent ([place]) 12/16/2012  . Inadequate anticoagulation 04/29/2012  . Hypocalcemia 07/28/2010  . CAD (coronary artery disease)   . HLD (hyperlipidemia)   . COPD (chronic obstructive pulmonary disease) ([place])   . Tobacco abuse   . Depression    Outpatient Encounter Prescriptions as of 01/18/2016  Medication Sig  . aspirin EC 81 MG tablet Take 1 tablet (81 mg total) by mouth daily.  Marland Kitchen buPROPion (WELLBUTRIN XL) 150 MG 24 hr tablet Take 1 tablet (150 mg total) by mouth daily.  Marland Kitchen glucosamine-chondroitin 500-400 MG tablet Take 1 tablet by mouth as needed (joint pain).   Marland Kitchen lovastatin (MEVACOR) 40 MG tablet Take 1 tablet (40 mg total) by mouth at bedtime.  Marland Kitchen MELATONIN PO Take 2 tablets by mouth at bedtime as needed (sleep).  . mirtazapine (REMERON) 30 MG tablet Take 1 tablet (30 mg total) by mouth at  bedtime.  . multivitamin-lutein (OCUVITE-LUTEIN) CAPS capsule Take 1 capsule by mouth every other day.  . naproxen sodium (ANAPROX) 220 MG tablet Take 220 mg by mouth 2 (two) times daily as needed (pain).   . psyllium (METAMUCIL) 58.6 % powder Take 1 packet by mouth daily as needed (constipation).  . sennosides-docusate sodium (SENOKOT-S) 8.6-50 MG tablet Take 1 tablet by mouth daily as needed for constipation.  . sodium chloride (OCEAN) 0.65 % SOLN nasal spray Place 1 spray into both nostrils as needed for congestion.  Marland Kitchen tetrahydrozoline 0.05 % ophthalmic solution Place 2-3 drops into both eyes 4 (four) times daily as needed (For eye redness.).  Marland Kitchen traMADol (ULTRAM) 50 MG tablet Take 50 mg by mouth every 12 (twelve) hours as needed for moderate pain or severe pain.  Marland Kitchen VALERIAN ROOT PO Take 1 capsule by mouth daily as needed (sleep).  . zolpidem (AMBIEN) 10 MG tablet Take 1 tablet (10 mg total) by mouth at bedtime as needed for sleep.  . metoprolol tartrate (LOPRESSOR) 25 MG tablet Take 0.5 tablets (12.5 mg total) by mouth 2 (two) times daily.   Facility-Administered Encounter Medications as of 01/18/2016  Medication  . testosterone cypionate (DEPOTESTOSTERONE CYPIONATE) injection 200 mg      Review of Systems  Constitutional: Positive for fatigue.  Respiratory: Negative.   Cardiovascular: Negative.        Objective:   Physical Exam  Constitutional: He is oriented to person, place,  and time. He appears well-developed and well-nourished.  Cardiovascular: Normal rate and regular rhythm.   Pulmonary/Chest: Effort normal and breath sounds normal.  Neurological: He is alert and oriented to person, place, and time.  Psychiatric: He has a normal mood and affect.   BP 127/84   Pulse 71   Temp 97.2 F (36.2 C) (Oral)   Ht 5\' 9"  (1.753 m)   Wt 210 lb (95.3 kg)   BMI 31.01 kg/m         Assessment & Plan:  1. Moderate episode of recurrent major depressive disorder (HCC) Think a lot  of his problems stem from some depression and loneliness. These include lack of energy. He really has some insight and intelligence but I think he likes to visit the doctor's office too   2. Chronic right hip pain Using tramadol as needed for hip pain\\Stephen Loleta Chance MD .

## 2016-01-18 ENCOUNTER — Encounter: Payer: Self-pay | Admitting: Family Medicine

## 2016-01-18 ENCOUNTER — Telehealth (HOSPITAL_COMMUNITY): Payer: Self-pay | Admitting: *Deleted

## 2016-01-18 ENCOUNTER — Ambulatory Visit (INDEPENDENT_AMBULATORY_CARE_PROVIDER_SITE_OTHER): Payer: Medicare Other | Admitting: Family Medicine

## 2016-01-18 VITALS — BP 127/84 | HR 71 | Temp 97.2°F | Ht 69.0 in | Wt 210.0 lb

## 2016-01-18 DIAGNOSIS — M25551 Pain in right hip: Secondary | ICD-10-CM

## 2016-01-18 DIAGNOSIS — G8929 Other chronic pain: Secondary | ICD-10-CM | POA: Diagnosis not present

## 2016-01-18 DIAGNOSIS — F331 Major depressive disorder, recurrent, moderate: Secondary | ICD-10-CM

## 2016-01-18 NOTE — Telephone Encounter (Signed)
lmtcb

## 2016-01-18 NOTE — Telephone Encounter (Signed)
Called pt and lmtcb and office number provided.  

## 2016-01-18 NOTE — Telephone Encounter (Signed)
Patient left voice message for his refill to go to [facility].

## 2016-01-19 ENCOUNTER — Ambulatory Visit (HOSPITAL_COMMUNITY): Payer: Medicare Other

## 2016-01-19 ENCOUNTER — Telehealth (HOSPITAL_COMMUNITY): Payer: Self-pay | Admitting: *Deleted

## 2016-01-19 ENCOUNTER — Ambulatory Visit (INDEPENDENT_AMBULATORY_CARE_PROVIDER_SITE_OTHER): Payer: Medicare Other | Admitting: Psychiatry

## 2016-01-19 ENCOUNTER — Ambulatory Visit (HOSPITAL_COMMUNITY)
Admission: RE | Admit: 2016-01-19 | Discharge: 2016-01-19 | Disposition: A | Discharge status: Home / Self Care | Payer: Medicare Other | Source: Ambulatory Visit | Attending: General Surgery | Admitting: General Surgery

## 2016-01-19 ENCOUNTER — Encounter (HOSPITAL_COMMUNITY): Payer: Self-pay | Admitting: Psychiatry

## 2016-01-19 DIAGNOSIS — N433 Hydrocele, unspecified: Secondary | ICD-10-CM | POA: Insufficient documentation

## 2016-01-19 DIAGNOSIS — N503 Cyst of epididymis: Secondary | ICD-10-CM | POA: Diagnosis not present

## 2016-01-19 DIAGNOSIS — F331 Major depressive disorder, recurrent, moderate: Secondary | ICD-10-CM

## 2016-01-19 DIAGNOSIS — K4090 Unilateral inguinal hernia, without obstruction or gangrene, not specified as recurrent: Secondary | ICD-10-CM | POA: Diagnosis not present

## 2016-01-19 DIAGNOSIS — R1909 Other intra-abdominal and pelvic swelling, mass and lump: Secondary | ICD-10-CM | POA: Insufficient documentation

## 2016-01-19 NOTE — Telephone Encounter (Signed)
Pt called office back. RMA informed pt that due to him requesting for office to send more refills to [facility] for his Remeron, pt could call or go to that location he wants to fill with and request a transfer. Per pt he went to the [facility] and was informed by Harrell Gave that his medication had expired. Informed pt that per his chart with office, he should not be out of his Remeron and he should have refills for this medication. RMA informed pt that she will have to call pharmacy to figure out what's going on and pt agreed. Called pt in [place] and spoke with Beverlee Nims and she stated that pt do have written script that was written on 12-16-15 on hold waiting for pt to call them requesting refills. Informed Beverlee Nims with with pt stated about his medication be expired and she stated she do no know who told him that but his Remeron is not expired. RMA asked Beverlee Nims if she could transfer the Remeron to the [facility] location in [place] due to pt wanting to fill his medication with them. Per Beverlee Nims, pt do not have to request a transfer due to all the [facility] location have to do is look it up and they will be able to see medication and able to pull it up and fill it. Called pt and informed him with this information. Per pt "why couldn't RMA leave this information on his VM?" RMA informed pt that detail massage about his personal information like his medication can not be left on voicemail and pt verbalized understanding and stated okay. RMA did informed pt with what Beverlee Nims stated and he also verbalized understanding.

## 2016-01-19 NOTE — Telephone Encounter (Signed)
Voice message at 9:04 a.m. [facility] has no scripts for him.  only thing they have is the dissolve in mouth.

## 2016-01-19 NOTE — Telephone Encounter (Signed)
Per Denice Paradise the pharmacy, pt have his Remeron and picked it up today.

## 2016-01-19 NOTE — Progress Notes (Signed)
Patient:  Anthony [name].   DOB: [birth year]  MR Number: [id number]  Location: [facility]:  [address] [place],  [place], [address]  Start: Thursday  01/19/2016 2:15 PM End: Thursday 01/19/2016  3:10 PM  Provider/Observer:     Maurice Small, MSW, LCSW   Chief Complaint:      Chief Complaint  Patient presents with  . Depression    Reason For Service:     Anthony [name]. is a 66 y.o. male who says I am here because Dr. Modesta Messing referred me. I have depression. I think about suicide. I wouldn't do anything to myself because of my family. I am stressed with going to the doctors. I have been very weak for the past 1 1/2 years and I am falling behind in doing things.  Interventions Strategy:  Supportive  Participation Level:   Active  Participation Quality:  Appropriate      Behavioral Observation:  Fairly Groomed, Alert, and Appropriate   Current Psychosocial Factors: Health, family issues  Content of Session:   Established rapport, reviewed symptoms, discussed stressors, examined patient's pattern of interaction with his family, discussed ways to improve self-care  Current Status:   depressed mood, worry, fatigue, poor motivation, diminished interest/pleasure in activities  Suicidal/Homicidal:   No  Patient Progress:   [place]. Patient reports less depressed mood and improved sleep pattern since last session. He also reports decreased frequency of passive SI and still says he would not do anything to harm self. He reports he did see oldest daughter during the holidays and reports relief he and his youngest daughter have resumed contact. He reports trying to increase physical activity by walking regularly and says he eats healthy meals although he skips some meals. He continues to experience fatigue and reports feeling despondent. He  expresses continued frustration regarding his health issues and suspects he may need another surgery to correct previous hernia surgery. He    Target Goals:   1. Establish rapport.  2.  Identify ways to improve self-care.   Last Reviewed:     Goals Addressed Today:    1, 2  Impression/Diagnosis:   Patient presents with symptoms of depression that have been present most of his life per his report. Patient reports one psychiatric hosptialization which occurred at age 69 due to manic episode. He also participated in outpatient therapy for about a year when he was around 31 or 24. Patient also participated in outpatient therapy at [facility] intermittently for several years. Current symptoms include depressed mood, worry, fatigue, poor motivation, diminished interest/pleasure in activities.    Diagnosis:  Axis I: Moderate episode of recurrent major depressive disorder ([place])          Axis II: Deferred    Tab Rylee, LCSW 01/19/2016

## 2016-01-19 NOTE — Telephone Encounter (Signed)
lmtcb

## 2016-01-19 NOTE — Telephone Encounter (Signed)
Pt called office back. RMA informed pt that due to him requesting for office to send more refills to [facility] for his Remeron, pt could call or go to that location he wants to fill with and request a transfer. Per pt he went to the [facility] and was informed by Harrell Gave that his medication had expired. Informed pt that per his chart with office, he should not be out of his Remeron and he should have refills for this medication. RMA informed pt that she will have to call pharmacy to figure out what's going on and pt agreed. Called pt in [place] and spoke with Beverlee Nims and she stated that pt do have written script that was written on 12-16-15 on hold waiting for pt to call them requesting refills. Informed Beverlee Nims with with pt stated about his medication be expired and she stated she do no know who told him that but his Remeron is not expired. RMA asked Beverlee Nims if she could transfer the Remeron to the [facility] location in [place] due to pt wanting to fill his medication with them. Per Beverlee Nims, pt do not have to request a transfer due to all the [facility] location have to do is look it up and they will be able to see medication and able to pull it up and fill it. Called pt and informed him with this information. Per pt "why couldn't RMA leave this information on his VM?" RMA informed pt that detail massage about his personal information like his medication can not be left on voicemail and pt verbalized understanding and stated okay. RMA did informed pt with what Beverlee Nims stated and he also verbalized understanding.

## 2016-01-19 NOTE — Telephone Encounter (Signed)
Pt called office back. RMA informed pt that due to him requesting for office to send more refills to [facility] for his Remeron, pt could call or go to that location he wants to fill with and request a transfer. Per pt he went to the [facility] and was informed by Harrell Gave that his medication had expired. Informed pt that per his chart with office, he should not be out of his Remeron and he should have refills for this medication. RMA informed pt that she will have to call pharmacy to figure out what's going on and pt agreed. Called pt in [place] and spoke with Beverlee Nims and she stated that pt do have written script that was written on 12-16-15 on hold waiting for pt to call them requesting refills. Informed Beverlee Nims with with pt stated about his medication be expired and she stated she do no know who told him that but his Remeron is not expired. RMA asked Beverlee Nims if she could transfer the Remeron to the [facility] location in [place] due to pt wanting to fill his medication with them. Per Beverlee Nims, pt do not have to request a transfer due to all the [facility] location have to do is look it up and they will be able to see medication and able to pull it up and fill it. Called pt and informed him with this information. Per pt "why couldn't RMA leave this information on his VM?" RMA informed pt that detail massage about his personal information like his medication can not be left on voicemail and pt verbalized understanding and stated okay. RMA did informed pt with what Beverlee Nims stated and he also verbalized understanding.

## 2016-01-23 NOTE — Progress Notes (Signed)
[employer] MD/PA/NP OP Progress Note  01/24/2016 11:33 AM Anthony [name] Erle Crocker.  MRN:  [id number]  Chief Complaint:  Chief Complaint    Follow-up; Depression     Subjective:  "little better" HPI:  Patient presents for follow up appointment. He was not able to afford Wellbutrin. He is concerned about weight gain and reports his frustration about his care for hernia. He has been trying to be active and do walking. He feels a little better and has less SI. He feels anxious and has occasional panic attack. He sleeps only for hour hours.   Visit Diagnosis:    ICD-9-CM ICD-10-CM   1. Moderate episode of recurrent major depressive disorder ([place]) 296.32 F33.1     Past Psychiatric History:  Outpatient: not since 2014 [facility] (states he has "manic" ), [place] in 12/9-12/15/2014 for depression with SI Previous suicide attempt: Patient has a history of suicidal attempt in 1995 when he overdosed on sleeping pills. Past trials of medication: Wellbutrin, Cymbalta, sertraline,   Past Medical History:  Past Medical History:  Diagnosis Date  . Anxiety   . Arthritis   . Bipolar disorder ([place])   . CAD (coronary artery disease)    a. INF STEMI 07/04/10:  tx with thrombectomy + [facility] to [facility];  cath 07/04/10: dLM 10-20%, pLAD 40-50%, mLAD 20-30%, pRCA 30%, mRCA occluded and tx with PCI, EF 50% with inf HK. A Multilink  . COPD (chronic obstructive pulmonary disease) ([place])   . Depression   . Glucose intolerance (impaired glucose tolerance)    A1c 6.2 06/2010  . History of DVT (deep vein thrombosis)    traumatic, s/p coumadin tx.  Marland Kitchen HLD (hyperlipidemia)   . Hypertension    pt states is currently on no medications  . Myocardial infarction    around 2013  . Shortness of breath    walking distance or climbing stairs  . Tobacco abuse   . Urinary frequency     Past Surgical History:  Procedure Laterality Date  . CARDIAC CATHETERIZATION    . COLONOSCOPY N/A  12/29/2015   Procedure: COLONOSCOPY;  Surgeon: Rogene Houston, MD;  [facility]: [facility];  Service: Endoscopy;  Laterality: N/A;  12:55  . ELECTROCARDIOGRAM     Showed inferolateral ST-elevation and a code STEMI was activated. In the ER, he was treated with morphine, herarin, and 600 mg of Plavix. He was transferred emergently to [facility].   . INGUINAL HERNIA REPAIR Left 07/26/2014   Procedure: OPEN REPAIR OF RECURENT LEFT INGUINAL HERNIA REPAIR WITH MESH;  Surgeon: Greer Pickerel, MD;  Location: [facility] ORS;  Service: General;  Laterality: Left;  . INSERTION OF MESH Bilateral 10/30/2013   Procedure: INSERTION OF MESH;  Surgeon: Gayland Curry, MD;  Location: [facility];  Service: General;  Laterality: Bilateral;  . LAPAROSCOPIC INGUINAL HERNIA WITH UMBILICAL HERNIA Bilateral 10/30/2013   Procedure: laparoscopic repair left pantaloom hernia with mesh, laparoscopic right inguinal hernia with mesh, OPEN REPAIR OF UMBILICAL HERNIA ;  Surgeon: Gayland Curry, MD;  Location: [facility];  Service: General;  Laterality: Bilateral;  . LAPAROSCOPY N/A 07/26/2014   Procedure: LAPAROSCOPY DIAGNOSTIC;  Surgeon: Greer Pickerel, MD;  Location: [facility];  Service: General;  Laterality: N/A;  . POLYPECTOMY  12/29/2015   Procedure: POLYPECTOMY;  Surgeon: Rogene Houston, MD;  [facility]: [facility];  Service: Endoscopy;;  ascending colon, descending colon  . stent placement    .  TOTAL HIP ARTHROPLASTY Right 05/10/2015   Procedure: RIGHT TOTAL HIP ARTHROPLASTY ANTERIOR APPROACH;  Surgeon: Paralee Cancel, MD;  Location: [facility];  Service: Orthopedics;  Laterality: Right;  Marland Kitchen VASECTOMY      Family Psychiatric History: Denies (Details unknown),   Family History:  Family History  Problem Relation Age of Onset  . Coronary artery disease Father   . Depression Father   . Depression Mother     Social History:  Social History   Social History  . Marital status: Divorced    Spouse name: N/A  . Number of children: N/A  .  Years of education: N/A   Social History Main Topics  . Smoking status: Current Every Day Smoker    Packs/day: 2.00    Years: 45.00    Types: Cigarettes  . Smokeless tobacco: Never Used  . Alcohol use No     Comment: past hx of ETOH use was in inpt facility per court order  - 20 years, 10-18-2015 per pt not anymore,per pt stopped about 15-20 yrs ago  . Drug use: No     Comment: snorted heroin and cocaine, 10-18-2015 per pt in the past he did Marijuana only and nothing else  . Sexual activity: Not Asked   Other Topics Concern  . None   Social History Narrative   Lives in [place]. He lives alone. He has kids but is not married.     Allergies:  Allergies  Allergen Reactions  . Flomax [Tamsulosin Hcl] Other (See Comments)    This medication makes patient feel sick  . Penicillins Other (See Comments)    Reaction unknown occurred during childhood Has patient had a PCN reaction causing immediate rash, facial/tongue/throat swelling, SOB or lightheadedness with hypotension: unsure - childhood reaction Has patient had a PCN reaction causing severe rash involving mucus membranes or skin necrosis: unsure - childhood reaction Has patient had a PCN reaction that required hospitalization no Has patient had a PCN reaction occurring within the last 10 years: no If all of the above answers are "NO", then may p    Metabolic Disorder Labs: Lab Results  Component Value Date   HGBA1C 6.2 (H) 07/05/2010   MPG 131 (H) 07/05/2010   No results found for: PROLACTIN Lab Results  Component Value Date   CHOL 183 01/03/2016   TRIG 325 (H) 01/03/2016   HDL 29 (L) 01/03/2016   CHOLHDL 6.3 (H) 01/03/2016   VLDL 65 (H) 01/03/2016   LDLCALC 89 01/03/2016   LDLCALC NOT CALC 12/19/2015     Current Medications: Current Outpatient Prescriptions  Medication Sig Dispense Refill  . aspirin EC 81 MG tablet Take 1 tablet (81 mg total) by mouth daily. 90 tablet 3  . glucosamine-chondroitin 500-400 MG  tablet Take 1 tablet by mouth as needed (joint pain).     Marland Kitchen lovastatin (MEVACOR) 40 MG tablet Take 1 tablet (40 mg total) by mouth at bedtime. 30 tablet 11  . MELATONIN PO Take 2 tablets by mouth at bedtime as needed (sleep).    . metoprolol tartrate (LOPRESSOR) 25 MG tablet Take 0.5 tablets (12.5 mg total) by mouth 2 (two) times daily. 30 tablet 11  . mirtazapine (REMERON) 30 MG tablet Take 1 tablet (30 mg total) by mouth at bedtime. 30 tablet 1  . multivitamin-lutein (OCUVITE-LUTEIN) CAPS capsule Take 1 capsule by mouth every other day.    . naproxen sodium (ANAPROX) 220 MG tablet Take 220 mg by mouth 2 (two) times daily as needed (pain).     Marland Kitchen  psyllium (METAMUCIL) 58.6 % powder Take 1 packet by mouth daily as needed (constipation).    . sennosides-docusate sodium (SENOKOT-S) 8.6-50 MG tablet Take 1 tablet by mouth daily as needed for constipation.    . sodium chloride (OCEAN) 0.65 % SOLN nasal spray Place 1 spray into both nostrils as needed for congestion.    Marland Kitchen tetrahydrozoline 0.05 % ophthalmic solution Place 2-3 drops into both eyes 4 (four) times daily as needed (For eye redness.).    Marland Kitchen traMADol (ULTRAM) 50 MG tablet Take 50 mg by mouth every 12 (twelve) hours as needed for moderate pain or severe pain.    Marland Kitchen VALERIAN ROOT PO Take 1 capsule by mouth daily as needed (sleep).    . zolpidem (AMBIEN) 10 MG tablet Take 1 tablet (10 mg total) by mouth at bedtime as needed for sleep. 30 tablet 0   Current Facility-Administered Medications  Medication Dose Route Frequency Provider Last Rate Last Dose  . testosterone cypionate (DEPOTESTOSTERONE CYPIONATE) injection 200 mg  200 mg Intramuscular Q28 days Wardell Honour, MD   200 mg at 10/25/15 1556    Neurologic: Headache: No Seizure: No Paresthesias: No  Musculoskeletal: Strength & Muscle Tone: within normal limits Gait & Station: normal Patient leans: N/A  Psychiatric Specialty Exam: Review of Systems  Gastrointestinal: Negative for  blood in stool.  Neurological: Positive for weakness.  Psychiatric/Behavioral: Positive for depression and suicidal ideas. Negative for hallucinations and substance abuse. The patient is nervous/anxious and has insomnia.   All other systems reviewed and are negative.   Blood pressure 116/68, pulse 70, height 5' 8.5" (1.74 m), weight 212 lb 12.8 oz (96.5 kg).Body mass index is 31.89 kg/m.  General Appearance: Casual  Eye Contact:  Good  Speech:  Clear and Coherent  Volume:  Normal  Mood:  "little better"  Affect:  down  Thought Process:  Coherent and Goal Directed  Orientation:  Full (Time, Place, and Person)  Thought Content: Logical  Perceptions: denies AH/VH  Suicidal Thoughts:  Yes.  without intent/plan  Homicidal Thoughts:  No  Memory:  Immediate;   Good Recent;   Good Remote;   Good  Judgement:  Fair  Insight:  Fair  Psychomotor Activity:  Normal  Concentration:  Concentration: Good and Attention Span: Good  Recall:  Good  Fund of Knowledge: Good  Language: Good  Akathisia:  NA  Handed:  Right  AIMS (if indicated):  N/A  Assets:  Communication Skills Desire for Improvement  ADL's:  Intact  Cognition: WNL  Sleep:  poor   Assessment 66yo male with bipolar disorder per chart, CAD, HLD, tobacco abuse, COPD, DVT 2008, S/P right THA, AA, s/p bilateral inguinal hernia repair, who presented to the clinic for depression. Psychosocial stressors including divorce and financial strain.   # MDD  R/o MDD with mixed episode There is slight improvement in his neurovegetative symptoms since starting mirtazapine. Although there is a concern for weight gain, he reports his preference to continue this medication as he just refilled it. Will continue it at this time and may consider switching to other SSRI if he continues to have weight gain.  Noted that although he had periods of hypomanic episode post operation, it is likely related to anesthetic use;  will closely monitor any manic  symptoms. Discussed behavioral activation. Patient to continue to see for therapy.  Plan 1. Continue mirtazapine 30 mg at night 2. Return to clinic in one month 3. Patient to see Ms. Bynum for therapy  The patient demonstrates the following  risk factors for suicide: Chronic risk factors for suicide include psychiatric disorder,  medical illness, previous suicide attempt, demographic factors (male, >33 yo). Acute risk factors for suicide include family or marital conflict, unemployment, social withdrawal/isolation, medical problems.  Protective factors for this patient include positive social support, hope for the future, being amenable to [facility] treatment, seeking for help.  Considering these factors, the overall suicide risk at this point appears to be low. Emergency resources including 911, ED, suicide hotline was discussed.   Treatment Plan Summary: Medication management  Norman Clay, MD 01/24/2016, 11:33 AM

## 2016-01-24 ENCOUNTER — Encounter (HOSPITAL_COMMUNITY): Payer: Self-pay | Admitting: Psychiatry

## 2016-01-24 ENCOUNTER — Ambulatory Visit (INDEPENDENT_AMBULATORY_CARE_PROVIDER_SITE_OTHER): Payer: Medicare Other | Admitting: Psychiatry

## 2016-01-24 VITALS — BP 116/68 | HR 70 | Ht 68.5 in | Wt 212.8 lb

## 2016-01-24 DIAGNOSIS — Z880 Allergy status to penicillin: Secondary | ICD-10-CM

## 2016-01-24 DIAGNOSIS — Z9852 Vasectomy status: Secondary | ICD-10-CM

## 2016-01-24 DIAGNOSIS — Z818 Family history of other mental and behavioral disorders: Secondary | ICD-10-CM | POA: Diagnosis not present

## 2016-01-24 DIAGNOSIS — Z9889 Other specified postprocedural states: Secondary | ICD-10-CM | POA: Diagnosis not present

## 2016-01-24 DIAGNOSIS — F331 Major depressive disorder, recurrent, moderate: Secondary | ICD-10-CM

## 2016-01-24 DIAGNOSIS — R45851 Suicidal ideations: Secondary | ICD-10-CM

## 2016-01-24 DIAGNOSIS — Z79899 Other long term (current) drug therapy: Secondary | ICD-10-CM

## 2016-01-24 DIAGNOSIS — Z7982 Long term (current) use of aspirin: Secondary | ICD-10-CM

## 2016-01-24 DIAGNOSIS — Z888 Allergy status to other drugs, medicaments and biological substances status: Secondary | ICD-10-CM

## 2016-01-24 DIAGNOSIS — F17210 Nicotine dependence, cigarettes, uncomplicated: Secondary | ICD-10-CM

## 2016-01-24 NOTE — Patient Instructions (Signed)
1. Continue mirtazapine 30 mg at night 2. Return to clinic in one month

## 2016-01-27 ENCOUNTER — Telehealth: Payer: Self-pay | Admitting: Family Medicine

## 2016-01-30 NOTE — Progress Notes (Signed)
Subjective:    Patient ID: Anthony [name]., male    DOB: 09-Oct-1950, 66 y.o.   MRN: [id number]  HPI 66 year old gentleman who is here really doing pretty well. He is sleeping better with Ambien he's walking some and taking a probiotic which was suggested. He is having problems with recurrence of the left hernia that was repaired. He has swelling in the scrotum and underwent recen he request referral to surgeon other than the one who originally repaired the defect. t ultrasound which demonstrated hernia.  Patient Active Problem List   Diagnosis Date Noted  . Rectal bleeding 11/23/2015  . Moderate episode of recurrent major depressive disorder ([place]) 10/18/2015  . Overweight (BMI 25.0-29.9) 05/11/2015  . S/P right THA, AA 05/10/2015  . Recurrent left inguinal hernia 07/26/2014  . Chronic right hip pain 06/23/2014  . S/P bilateral inguinal hernia repair 10/30/2013  . Nodule of left lung 05/29/2013  . Bilateral inguinal hernia 05/28/2013  . Umbilical hernia [id number]  . Major psychotic depression, recurrent ([place]) 12/16/2012  . Inadequate anticoagulation 04/29/2012  . Hypocalcemia 07/28/2010  . CAD (coronary artery disease)   . HLD (hyperlipidemia)   . COPD (chronic obstructive pulmonary disease) ([place])   . Tobacco abuse   . Depression    Outpatient Encounter Prescriptions as of 01/31/2016  Medication Sig  . aspirin EC 81 MG tablet Take 1 tablet (81 mg total) by mouth daily.  Marland Kitchen glucosamine-chondroitin 500-400 MG tablet Take 1 tablet by mouth as needed (joint pain).   Marland Kitchen lovastatin (MEVACOR) 40 MG tablet Take 1 tablet (40 mg total) by mouth at bedtime.  Marland Kitchen MELATONIN PO Take 2 tablets by mouth at bedtime as needed (sleep).  . mirtazapine (REMERON) 30 MG tablet Take 1 tablet (30 mg total) by mouth at bedtime.  . multivitamin-lutein (OCUVITE-LUTEIN) CAPS capsule Take 1 capsule by mouth every other day.  . naproxen sodium (ANAPROX) 220 MG tablet Take 220 mg by mouth 2 (two) times daily as  needed (pain).   . Probiotic Product (PROBIOTIC-10 PO) Take by mouth.  . psyllium (METAMUCIL) 58.6 % powder Take 1 packet by mouth daily as needed (constipation).  . sennosides-docusate sodium (SENOKOT-S) 8.6-50 MG tablet Take 1 tablet by mouth daily as needed for constipation.  . sodium chloride (OCEAN) 0.65 % SOLN nasal spray Place 1 spray into both nostrils as needed for congestion.  Marland Kitchen tetrahydrozoline 0.05 % ophthalmic solution Place 2-3 drops into both eyes 4 (four) times daily as needed (For eye redness.).  Marland Kitchen traMADol (ULTRAM) 50 MG tablet Take 50 mg by mouth every 12 (twelve) hours as needed for moderate pain or severe pain.  Marland Kitchen VALERIAN ROOT PO Take 1 capsule by mouth daily as needed (sleep).  . metoprolol tartrate (LOPRESSOR) 25 MG tablet Take 0.5 tablets (12.5 mg total) by mouth 2 (two) times daily.  Marland Kitchen zolpidem (AMBIEN) 10 MG tablet Take 1 tablet (10 mg total) by mouth at bedtime as needed for sleep.   Facility-Administered Encounter Medications as of 01/31/2016  Medication  . testosterone cypionate (DEPOTESTOSTERONE CYPIONATE) injection 200 mg      Review of Systems  Constitutional: Negative.   Respiratory: Negative.   Cardiovascular: Negative.   Genitourinary: Negative.   Neurological: Negative.   Psychiatric/Behavioral: Negative.        Objective:   Physical Exam  Constitutional: He is oriented to person, place, and time. He appears well-developed and well-nourished.  Cardiovascular: Normal rate, regular rhythm and normal heart sounds.   Pulmonary/Chest: Effort normal  and breath sounds normal.  Neurological: He is alert and oriented to person, place, and time.  Psychiatric: He has a normal mood and affect. His behavior is normal.   BP 123/75   Pulse 76   Temp 97.9 F (36.6 C) (Oral)   Ht 5\' 9"  (1.753 m)   Wt 210 lb 9.6 oz (95.5 kg)   BMI 31.10 kg/m         Assessment & Plan:  1. Major psychotic depression, recurrent ([place]) Followed by psychiatry and  psychology. I think he is doing best that I have seen him do in several years  2. Bilateral recurrent inguinal hernia without obstruction or gangrene Will refer to surgery to see if he needs further repair or repeat  Wardell Honour MD

## 2016-01-31 ENCOUNTER — Ambulatory Visit (INDEPENDENT_AMBULATORY_CARE_PROVIDER_SITE_OTHER): Payer: Medicare Other | Admitting: Family Medicine

## 2016-01-31 ENCOUNTER — Encounter: Payer: Self-pay | Admitting: Family Medicine

## 2016-01-31 VITALS — BP 123/75 | HR 76 | Temp 97.9°F | Ht 69.0 in | Wt 210.6 lb

## 2016-01-31 DIAGNOSIS — F333 Major depressive disorder, recurrent, severe with psychotic symptoms: Secondary | ICD-10-CM | POA: Diagnosis not present

## 2016-01-31 DIAGNOSIS — K4021 Bilateral inguinal hernia, without obstruction or gangrene, recurrent: Secondary | ICD-10-CM | POA: Diagnosis not present

## 2016-01-31 MED ORDER — TRAMADOL HCL 50 MG PO TABS: 50.0000 mg | ORAL_TABLET | Freq: Two times a day (BID) | ORAL | 0 refills | Status: DC | PRN

## 2016-01-31 MED ORDER — ZOLPIDEM TARTRATE 10 MG PO TABS: 10.0000 mg | ORAL_TABLET | Freq: Every evening | ORAL | 2 refills | Status: DC | PRN

## 2016-01-31 NOTE — Telephone Encounter (Signed)
All the surgeons I know of in [place] 1 group. Sometimes it's a problem to get another surgeon in the same group. We can refer him to a surgeon in [place] if he desires.

## 2016-01-31 NOTE — Telephone Encounter (Signed)
Lm for pt to call back and let us know if he prefers [facility] or [place]

## 2016-02-01 ENCOUNTER — Ambulatory Visit (INDEPENDENT_AMBULATORY_CARE_PROVIDER_SITE_OTHER): Payer: Medicare Other | Admitting: Psychiatry

## 2016-02-01 ENCOUNTER — Encounter (HOSPITAL_COMMUNITY): Payer: Self-pay | Admitting: Psychiatry

## 2016-02-01 ENCOUNTER — Telehealth (HOSPITAL_COMMUNITY): Payer: Self-pay | Admitting: *Deleted

## 2016-02-01 DIAGNOSIS — F331 Major depressive disorder, recurrent, moderate: Secondary | ICD-10-CM

## 2016-02-01 NOTE — Progress Notes (Signed)
Patient:  Anthony [name].   DOB: 01/11/1950  MR Number: [id number]  Location: [place]:  [address] [place],  [place], [address]  Start: Wednesday 02/01/2016 3:05 PM End Wednesday 02/01/2016 4:05 PM   Provider/Observer:     Maurice Small, MSW, LCSW   Chief Complaint:      Chief Complaint  Patient presents with  . Depression    Reason For Service:     Anthony [name]. is a 66 y.o. male who says I am here because Dr. Modesta Messing referred me. I have depression. I think about suicide. I wouldn't do anything to myself because of my family. I am stressed with going to the doctors. I have been very weak for the past 1 1/2 years and I am falling behind in doing things.  Interventions Strategy:  Supportive  Participation Level:   Active  Participation Quality:  Appropriate      Behavioral Observation:  Fairly Groomed, Alert, and Appropriate   Current Psychosocial Factors: Health, family issues  Content of Session:    reviewed symptoms, praised and reinforced patient's improved self-care efforts, discussed effects of use of assertiveness skills on mood, behavior, and interactions with wife, discussed strengths and support system, developed treatment plan, assisted patient identify ways to improve daily structure/routine and maintain consistency regarding self-care using daily planning,  Current Status:   Less, depressed mood, decreased worry, increased energy, improved motivation, increased interes in in activities  Suicidal/Homicidal:   No  Patient Progress:   Good. Patient reports continued  less depressed mood and improved sleep pattern since last session. He has begun taking Ambien. He reports improved motivation and increased energy. He he continued to improve self-care efforts regarding nutrition and physical activity including walking regularly. He expresses increased interest in activities. He expresses decreased frustration regarding health issues and more hope about  pursuing avenues to address and improve these issues. He also reports bein assertive with ex-wife regarding their financial arrangement regarding bills and expresses relief since doing this. He expresses increased desire to nurture social relationships.    Target Goals:   1. Learn and implement behavioral strategies to overcome depression.    2. Increase social involvement through learning and implementing interpersonal skills.  Last Reviewed:   02/01/2016  Goals Addressed Today:    1, 2  Impression/Diagnosis:   Patient presents with symptoms of depression that have been present most of his life per his report. Patient reports one psychiatric hosptialization which occurred at age 44 due to manic episode. He also participated in outpatient therapy for about a year when he was around 14 or 38. Patient also participated in outpatient therapy at [facility] intermittently for several years. Current symptoms include depressed mood, worry, fatigue, poor motivation, diminished interest/pleasure in activities.    Diagnosis:  Axis I: MDD, Recurrent          Axis II: Deferred    Aldea Avis, LCSW 02/01/2016

## 2016-02-02 NOTE — Telephone Encounter (Signed)
Pt came into office to see another provider. Pt then stated when he first came in to see Dr. Modesta Messing, she had put him on Abilify. Per pt he's thinking about wanting to retry it again but it is way too expensive and his Medicare will not pay for it. Per pt he wants office to look up the name of the company that makes Abilify and wants office to give him their number as well. Per pt he wants to call them and see if they have any patient assistance to where he can get that medication free. Office provided name and number to pt and pt showed and verbalized understanding.

## 2016-02-09 NOTE — Telephone Encounter (Signed)
Please check on his referral.       Patient prefers to get surgery at [facility] with second choice is [facility].  He does not want to go back to Dr. Redmond Pulling.

## 2016-02-10 NOTE — Addendum Note (Signed)
Addended by: Shelbie Ammons on: 02/10/2016 03:49 PM   Modules accepted: Orders

## 2016-02-10 NOTE — Telephone Encounter (Signed)
Referral placed and sent thru Epic to Dr. Arnoldo Morale in [place]. This will be a second opinion so this appointment may take some time to get scheduled

## 2016-02-15 ENCOUNTER — Telehealth (HOSPITAL_COMMUNITY): Payer: Self-pay | Admitting: *Deleted

## 2016-02-15 ENCOUNTER — Encounter (HOSPITAL_COMMUNITY): Payer: Self-pay | Admitting: Psychiatry

## 2016-02-15 ENCOUNTER — Ambulatory Visit (INDEPENDENT_AMBULATORY_CARE_PROVIDER_SITE_OTHER): Payer: Medicare Other | Admitting: Psychiatry

## 2016-02-15 DIAGNOSIS — F331 Major depressive disorder, recurrent, moderate: Secondary | ICD-10-CM

## 2016-02-15 NOTE — Progress Notes (Signed)
Patient:  Anthony [name].   DOB: October 30, 1950  MR Number: [id number]  Location: [place]:  [address] [place],  [place], [address]  Start: Wednesday 02/15/2016 2:10  PM End Wednesday 02/15/2016 3:04 PM                    Provider/Observer:     Anthony [name], MSW, LCSW   Chief Complaint:      Chief Complaint  Patient presents with  . Depression    Reason For Service:     Anthony [name]. is a 66 y.o. male who says I am here because Dr. Modesta Messing referred me. I have depression. I think about suicide. I wouldn't do anything to myself because of my family. I am stressed with going to the doctors. I have been very weak for the past 1 1/2 years and I am falling behind in doing things.  Interventions Strategy:  Supportive  Participation Level:   Active  Participation Quality:  Appropriate      Behavioral Observation:  Well groomed, Alert, and Appropriate   Current Psychosocial Factors: Health, family issues  Content of Session:    reviewed symptoms, praised and reinforced patient's improved self-care efforts and use of daily planning, discussed effects of improved self-care and use of daily planning, praised and reinforced patient's increased social involvement, began to discuss the connection between thoughts/mood/behavior, discussed patient's desire to discontinue smoking, assisted patient identify benefits of quitting smoking and ways to use as coping statements, provided patient with contact information for [place] [facility], assigned patient to continue using daily planning to maintain consistency regarding self-care, daily routine/structure  Current Status:   Less depressed mood, decreased worry, increased energy, improved motivation, increased interest in activities  Suicidal/Homicidal:   No  Patient Progress:   Good. Patient reports continued improved mood since last session. He has been using daily planning and reports increased involvement in activity. He has been  walking daily and reports having a healthy eating pattern. He also has been, completing household tasks, and following through on managing medical appointments as well as business affairs. He reports increased involvement with his family. He has visited daughter and her family who reside in the local area twice since last session and reports having dinner with them on one occasion. He reports enjoying spending time with his grandsons. He also has had contact with daughter in [place] and is excited she has extended an invitation for him to visit her and her family in the near future. Patient also reports frequent contact with his "adopted son" in the past 2 weeks. Patient reports feeling more hopeful.  Target Goals:   1. Learn and implement behavioral strategies to overcome depression.    2. Increase social involvement through learning and implementing interpersonal skills.  Last Reviewed:   02/01/2016  Goals Addressed Today:    1, 2  Impression/Diagnosis:   Patient presents with symptoms of depression that have been present most of his life per his report. Patient reports one psychiatric hosptialization which occurred at age 26 due to manic episode. He also participated in outpatient therapy for about a year when he was around 74 or 14. Patient also participated in outpatient therapy at [facility] intermittently for several years. Current symptoms include depressed mood, worry, fatigue, poor motivation, diminished interest/pleasure in activities.    Diagnosis:  Axis I: MDD, Recurrent          Axis II: Deferred    BYNUM,PEGGY,  LCSW 02/15/2016

## 2016-02-15 NOTE — Telephone Encounter (Signed)
Pt came into office to see another provider. Per pt, he filled out patient assistance form for Abilify and would like for Dr. Modesta Messing to fill in her potion of the form and fax it in for him. Pt then took the form stating he was donw completing his portion when staff pointed out he had to complete the portion with his medical insurance information. Pt then stated when he gets his contact information written in and his insurance information written on the form, if Dr. Modesta Messing could fill out her part and fax it for him and for office to also fax his medicare card with application as well.

## 2016-02-16 NOTE — Telephone Encounter (Signed)
Noted  

## 2016-02-21 NOTE — Progress Notes (Signed)
[facility] MD/PA/NP OP Progress Note  02/23/2016 10:55 AM Damita Dunnings Erle Crocker.  MRN:  [id number]  Chief Complaint:  Chief Complaint    Depression; Follow-up     Subjective:  "much better" HPI:  Patient presents for follow up appointment. He states that he is doing much better since the last appointment. Although there are days he feels depressed, it is not as frequent as it used to be. He denies SI. He prioritize walking twice a day and is careful of his diet. He reports his stress of the need to find a new PCP as the one will be retired. He endorses insomnia, sleeping 3-4 hours at times; he believes it has been getting better. He takes Tramadol at times for his pain.   Visit Diagnosis:    ICD-9-CM ICD-10-CM   1. Moderate episode of recurrent major depressive disorder ([place]) 296.32 F33.1     Past Psychiatric History:  Outpatient: not since 2014 [facility]states he has "manic" ), [facility] in 12/9-12/15/2014 for depression with SI Previous suicide attempt: Patient has a history of suicidal attempt in 1995 when he overdosed on sleeping pills. Past trials of medication: Wellbutrin, Cymbalta, sertraline, Trazodone  Past Medical History:  Past Medical History:  Diagnosis Date  . Anxiety   . Arthritis   . Bipolar disorder ([place])   . CAD (coronary artery disease)    a. INF STEMI 07/04/10:  tx with thrombectomy + [facility] to [facility];  cath 07/04/10: dLM 10-20%, pLAD 40-50%, mLAD 20-30%, pRCA 30%, mRCA occluded and tx with PCI, EF 50% with inf HK. A Multilink  . COPD (chronic obstructive pulmonary disease) ([place])   . Depression   . Glucose intolerance (impaired glucose tolerance)    A1c 6.2 06/2010  . History of DVT (deep vein thrombosis)    traumatic, s/p coumadin tx.  Marland Kitchen HLD (hyperlipidemia)   . Hypertension    pt states is currently on no medications  . Myocardial infarction    around 2013  . Shortness of breath    walking distance or climbing stairs   . Tobacco abuse   . Urinary frequency     Past Surgical History:  Procedure Laterality Date  . CARDIAC CATHETERIZATION    . COLONOSCOPY N/A 12/29/2015   Procedure: COLONOSCOPY;  Surgeon: Rogene Houston, MD;  [facility];  Service: Endoscopy;  Laterality: N/A;  12:55  . ELECTROCARDIOGRAM     Showed inferolateral ST-elevation and a code STEMI was activated. In the ER, he was treated with morphine, herarin, and 600 mg of Plavix. He was transferred emergently to [facility].   . INGUINAL HERNIA REPAIR Left 07/26/2014   Procedure: OPEN REPAIR OF RECURENT LEFT INGUINAL HERNIA REPAIR WITH MESH;  Surgeon: Greer Pickerel, MD;  Location: [facility] ORS;  Service: General;  Laterality: Left;  . INSERTION OF MESH Bilateral 10/30/2013   Procedure: INSERTION OF MESH;  Surgeon: Gayland Curry, MD;  Location: [facility];  Service: General;  Laterality: Bilateral;  . LAPAROSCOPIC INGUINAL HERNIA WITH UMBILICAL HERNIA Bilateral 10/30/2013   Procedure: laparoscopic repair left pantaloom hernia with mesh, laparoscopic right inguinal hernia with mesh, OPEN REPAIR OF UMBILICAL HERNIA ;  Surgeon: Gayland Curry, MD;  Location: [facility];  Service: General;  Laterality: Bilateral;  . LAPAROSCOPY N/A 07/26/2014   Procedure: LAPAROSCOPY DIAGNOSTIC;  Surgeon: Greer Pickerel, MD;  Location: [facility];  Service: General;  Laterality: N/A;  . POLYPECTOMY  12/29/2015  Procedure: POLYPECTOMY;  Surgeon: Rogene Houston, MD;  [facility];  Service: Endoscopy;;  ascending colon, descending colon  . stent placement    . TOTAL HIP ARTHROPLASTY Right 05/10/2015   Procedure: RIGHT TOTAL HIP ARTHROPLASTY ANTERIOR APPROACH;  Surgeon: Paralee Cancel, MD;  Location: [facility];  Service: Orthopedics;  Laterality: Right;  Marland Kitchen VASECTOMY      Family Psychiatric History: Denies (Details unknown),   Family History:  Family History  Problem Relation Age of Onset  . Coronary artery disease Father   . Depression Father   . Depression  Mother     Social History:  Social History   Social History  . Marital status: Divorced    Spouse name: N/A  . Number of children: N/A  . Years of education: N/A   Social History Main Topics  . Smoking status: Current Every Day Smoker    Packs/day: 2.00    Years: 45.00    Types: Cigarettes  . Smokeless tobacco: Never Used  . Alcohol use No     Comment: past hx of ETOH use was in inpt facility per court order  - 20 years, 10-18-2015 per pt not anymore,per pt stopped about 15-20 yrs ago  . Drug use: No     Comment: snorted heroin and cocaine, 10-18-2015 per pt in the past he did Marijuana only and nothing else  . Sexual activity: Not Asked   Other Topics Concern  . None   Social History Narrative   Lives in [place]. He lives alone. He has kids but is not married.     Allergies:  Allergies  Allergen Reactions  . Flomax [Tamsulosin Hcl] Other (See Comments)    This medication makes patient feel sick  . Penicillins Other (See Comments)    Reaction unknown occurred during childhood Has patient had a PCN reaction causing immediate rash, facial/tongue/throat swelling, SOB or lightheadedness with hypotension: unsure - childhood reaction Has patient had a PCN reaction causing severe rash involving mucus membranes or skin necrosis: unsure - childhood reaction Has patient had a PCN reaction that required hospitalization no Has patient had a PCN reaction occurring within the last 10 years: no If all of the above answers are "NO", then may p    Metabolic Disorder Labs: Lab Results  Component Value Date   HGBA1C 6.2 (H) 07/05/2010   MPG 131 (H) 07/05/2010   No results found for: PROLACTIN Lab Results  Component Value Date   CHOL 183 01/03/2016   TRIG 325 (H) 01/03/2016   HDL 29 (L) 01/03/2016   CHOLHDL 6.3 (H) 01/03/2016   VLDL 65 (H) 01/03/2016   LDLCALC 89 01/03/2016   LDLCALC NOT CALC 12/19/2015     Current Medications: Current Outpatient Prescriptions  Medication  Sig Dispense Refill  . aspirin EC 81 MG tablet Take 1 tablet (81 mg total) by mouth daily. 90 tablet 3  . Cyanocobalamin (VITAMIN B-12 PO) Take 1,000 mg by mouth daily as needed.    Marland Kitchen glucosamine-chondroitin 500-400 MG tablet Take 1 tablet by mouth as needed (joint pain).     Marland Kitchen lovastatin (MEVACOR) 40 MG tablet Take 1 tablet (40 mg total) by mouth at bedtime. 30 tablet 11  . MELATONIN PO Take 2 tablets by mouth at bedtime as needed (sleep).    . metoprolol tartrate (LOPRESSOR) 25 MG tablet Take 0.5 tablets (12.5 mg total) by mouth 2 (two) times daily. 30 tablet 11  . mirtazapine (REMERON) 30 MG tablet Take 1 tablet (30  mg total) by mouth at bedtime. 30 tablet 1  . multivitamin-lutein (OCUVITE-LUTEIN) CAPS capsule Take 1 capsule by mouth every other day.    . naproxen sodium (ANAPROX) 220 MG tablet Take 220 mg by mouth 2 (two) times daily as needed (pain).     . Probiotic Product (PROBIOTIC-10 PO) Take by mouth.    . psyllium (METAMUCIL) 58.6 % powder Take 1 packet by mouth daily as needed (constipation).    . sennosides-docusate sodium (SENOKOT-S) 8.6-50 MG tablet Take 1 tablet by mouth daily as needed for constipation.    . sodium chloride (OCEAN) 0.65 % SOLN nasal spray Place 1 spray into both nostrils as needed for congestion.    Marland Kitchen tetrahydrozoline 0.05 % ophthalmic solution Place 2-3 drops into both eyes 4 (four) times daily as needed (For eye redness.).    Marland Kitchen traMADol (ULTRAM) 50 MG tablet Take 1 tablet (50 mg total) by mouth every 12 (twelve) hours as needed for moderate pain or severe pain. 30 tablet 0  . VALERIAN ROOT PO Take 1 capsule by mouth daily as needed (sleep).    . zolpidem (AMBIEN) 10 MG tablet Take 1 tablet (10 mg total) by mouth at bedtime as needed for sleep. 30 tablet 2   Current Facility-Administered Medications  Medication Dose Route Frequency Provider Last Rate Last Dose  . testosterone cypionate (DEPOTESTOSTERONE CYPIONATE) injection 200 mg  200 mg Intramuscular Q28 days  Wardell Honour, MD   200 mg at 10/25/15 1556    Neurologic: Headache: No Seizure: No Paresthesias: No  Musculoskeletal: Strength & Muscle Tone: within normal limits Gait & Station: normal Patient leans: N/A  Psychiatric Specialty Exam: Review of Systems  Gastrointestinal: Negative for blood in stool.  Neurological: Positive for weakness.  Psychiatric/Behavioral: Positive for depression. Negative for hallucinations, substance abuse and suicidal ideas. The patient is nervous/anxious and has insomnia.   All other systems reviewed and are negative.   Blood pressure 114/76, pulse 74, height 5' 8.5" (1.74 m), weight 205 lb 12.8 oz (93.4 kg), SpO2 91 %.Body mass index is 30.84 kg/m.  General Appearance: Casual  Eye Contact:  Good  Speech:  Clear and Coherent  Volume:  Normal  Mood:  "much better"  Affect:  down- improving  Thought Process:  Coherent and Goal Directed  Orientation:  Full (Time, Place, and Person)  Thought Content: Logical  Perceptions: denies AH/VH  Suicidal Thoughts:  No  Homicidal Thoughts:  No  Memory:  Immediate;   Good Recent;   Good Remote;   Good  Judgement:  Fair  Insight:  Fair  Psychomotor Activity:  Normal  Concentration:  Concentration: Good and Attention Span: Good  Recall:  Good  Fund of Knowledge: Good  Language: Good  Akathisia:  NA  Handed:  Right  AIMS (if indicated):  N/A  Assets:  Communication Skills Desire for Improvement  ADL's:  Intact  Cognition: WNL  Sleep:  poor   Assessment 66yo male with bipolar disorder per chart, CAD, HLD, tobacco abuse, COPD, DVT 2008, S/P right THA, AA, s/p bilateral inguinal hernia repair, who presented to the clinic for depression. Psychosocial stressors including divorce and financial strain.   # MDD  R/o MDD with mixed episode There has been significant improvement in his neurovegetative symptoms since starting mirtazapine.He keeps behavioral activation and it helps him to have sense of  autonomy. Will continue current medication. He will continue to see Ms. Bynum for therapy. Noted that although he had periods of hypomanic episode post operation,  it is likely related to anesthetic use;  will closely monitor any manic symptoms.   Plan 1. Continue mirtazapine 30 mg at night 2. Return to clinic in two months 3. Patient to see Ms. Bynum for therapy (He is prescribed Zolpidem 10 mg at night by his PCP)  The patient demonstrates the following  risk factors for suicide: Chronic risk factors for suicide include psychiatric disorder,  medical illness, previous suicide attempt, demographic factors (male, >73 yo). Acute risk factors for suicide include family or marital conflict, unemployment, social withdrawal/isolation, medical problems.  Protective factors for this patient include positive social support, hope for the future, being amenable to [facility] treatment, seeking for help.  Considering these factors, the overall suicide risk at this point appears to be low. Emergency resources including 911, ED, suicide hotline was discussed.   Treatment Plan Summary: Medication management  Norman Clay, MD 02/23/2016, 10:55 AM

## 2016-02-22 ENCOUNTER — Telehealth (HOSPITAL_COMMUNITY): Payer: Self-pay | Admitting: *Deleted

## 2016-02-22 NOTE — Telephone Encounter (Signed)
LEFT VOICE MESSAGE, PROVIDER OUT OF OFFICE 02/29/16.

## 2016-02-23 ENCOUNTER — Encounter (HOSPITAL_COMMUNITY): Payer: Self-pay | Admitting: Psychiatry

## 2016-02-23 ENCOUNTER — Ambulatory Visit (INDEPENDENT_AMBULATORY_CARE_PROVIDER_SITE_OTHER): Payer: Medicare Other | Admitting: Psychiatry

## 2016-02-23 VITALS — BP 114/76 | HR 74 | Ht 68.5 in | Wt 205.8 lb

## 2016-02-23 DIAGNOSIS — F331 Major depressive disorder, recurrent, moderate: Secondary | ICD-10-CM

## 2016-02-23 DIAGNOSIS — Z9852 Vasectomy status: Secondary | ICD-10-CM | POA: Diagnosis not present

## 2016-02-23 DIAGNOSIS — Z880 Allergy status to penicillin: Secondary | ICD-10-CM

## 2016-02-23 DIAGNOSIS — Z8249 Family history of ischemic heart disease and other diseases of the circulatory system: Secondary | ICD-10-CM

## 2016-02-23 DIAGNOSIS — Z9889 Other specified postprocedural states: Secondary | ICD-10-CM

## 2016-02-23 DIAGNOSIS — Z7982 Long term (current) use of aspirin: Secondary | ICD-10-CM

## 2016-02-23 DIAGNOSIS — F17210 Nicotine dependence, cigarettes, uncomplicated: Secondary | ICD-10-CM

## 2016-02-23 DIAGNOSIS — Z818 Family history of other mental and behavioral disorders: Secondary | ICD-10-CM

## 2016-02-23 DIAGNOSIS — Z79899 Other long term (current) drug therapy: Secondary | ICD-10-CM

## 2016-02-23 DIAGNOSIS — Z888 Allergy status to other drugs, medicaments and biological substances status: Secondary | ICD-10-CM

## 2016-02-23 MED ORDER — MIRTAZAPINE 30 MG PO TABS: 30.0000 mg | ORAL_TABLET | Freq: Every day | ORAL | 1 refills | Status: DC

## 2016-02-23 NOTE — Patient Instructions (Signed)
1. Continue mirtazapine 30 mg at night 2. Return to clinic in two months

## 2016-02-29 ENCOUNTER — Encounter: Payer: Self-pay | Admitting: Family Medicine

## 2016-02-29 ENCOUNTER — Ambulatory Visit (HOSPITAL_COMMUNITY): Payer: Self-pay | Admitting: Psychiatry

## 2016-02-29 ENCOUNTER — Ambulatory Visit (INDEPENDENT_AMBULATORY_CARE_PROVIDER_SITE_OTHER): Payer: Medicare Other | Admitting: Family Medicine

## 2016-02-29 VITALS — BP 114/73 | HR 84 | Temp 98.4°F | Ht 69.0 in | Wt 204.0 lb

## 2016-02-29 DIAGNOSIS — Z8719 Personal history of other diseases of the digestive system: Secondary | ICD-10-CM | POA: Diagnosis not present

## 2016-02-29 DIAGNOSIS — F333 Major depressive disorder, recurrent, severe with psychotic symptoms: Secondary | ICD-10-CM | POA: Diagnosis not present

## 2016-02-29 DIAGNOSIS — Z9889 Other specified postprocedural states: Secondary | ICD-10-CM

## 2016-02-29 MED ORDER — TRAMADOL HCL 50 MG PO TABS: 50.0000 mg | ORAL_TABLET | Freq: Two times a day (BID) | ORAL | 0 refills | Status: DC | PRN

## 2016-02-29 MED ORDER — ZOLPIDEM TARTRATE 10 MG PO TABS: 10.0000 mg | ORAL_TABLET | Freq: Every evening | ORAL | 2 refills | Status: DC | PRN

## 2016-02-29 NOTE — Progress Notes (Signed)
Subjective:    Patient ID: Anthony Form., male    DOB: [birth year], 66 y.o.   MRN: [id number]  HPI Patient here today for 1 month follow up and med refills.  Patient is seeing psychiatrist and therapist and seems better psychologically than he has since I have known him. He is on mirtazapine 30 mg. This may have an effect on his appetite since he has gained some weight.   Patient Active Problem List   Diagnosis Date Noted  . Rectal bleeding 11/23/2015  . Moderate episode of recurrent major depressive disorder ([place]) 10/18/2015  . Overweight (BMI 25.0-29.9) 05/11/2015  . S/P right THA, AA 05/10/2015  . Recurrent left inguinal hernia 07/26/2014  . Chronic right hip pain 06/23/2014  . S/P bilateral inguinal hernia repair 10/30/2013  . Nodule of left lung 05/29/2013  . Bilateral inguinal hernia 05/28/2013  . Umbilical hernia [id number]  . Major psychotic depression, recurrent ([place]) 12/16/2012  . Inadequate anticoagulation 04/29/2012  . Hypocalcemia 07/28/2010  . CAD (coronary artery disease)   . HLD (hyperlipidemia)   . COPD (chronic obstructive pulmonary disease) ([place])   . Tobacco abuse   . Depression    Outpatient Encounter Prescriptions as of 02/29/2016  Medication Sig  . 5-Hydroxytryptophan (5-HTP PO) Take 1 tablet by mouth at bedtime.  Marland Kitchen aspirin EC 81 MG tablet Take 1 tablet (81 mg total) by mouth daily.  . Cyanocobalamin (VITAMIN B-12 PO) Take 1,000 mg by mouth daily as needed.  Marland Kitchen glucosamine-chondroitin 500-400 MG tablet Take 1 tablet by mouth as needed (joint pain).   Marland Kitchen lovastatin (MEVACOR) 40 MG tablet Take 1 tablet (40 mg total) by mouth at bedtime.  Marland Kitchen MELATONIN PO Take 2 tablets by mouth at bedtime as needed (sleep).  . metoprolol tartrate (LOPRESSOR) 25 MG tablet Take 0.5 tablets (12.5 mg total) by mouth 2 (two) times daily.  . mirtazapine (REMERON) 30 MG tablet Take 1 tablet (30 mg total) by mouth at bedtime.  . multivitamin-lutein (OCUVITE-LUTEIN) CAPS capsule  Take 1 capsule by mouth every other day.  . naproxen sodium (ANAPROX) 220 MG tablet Take 220 mg by mouth 2 (two) times daily as needed (pain).   . Probiotic Product (PROBIOTIC-10 PO) Take by mouth.  . psyllium (METAMUCIL) 58.6 % powder Take 1 packet by mouth daily as needed (constipation).  . sennosides-docusate sodium (SENOKOT-S) 8.6-50 MG tablet Take 1 tablet by mouth daily as needed for constipation.  . sodium chloride (OCEAN) 0.65 % SOLN nasal spray Place 1 spray into both nostrils as needed for congestion.  Marland Kitchen tetrahydrozoline 0.05 % ophthalmic solution Place 2-3 drops into both eyes 4 (four) times daily as needed (For eye redness.).  Marland Kitchen traMADol (ULTRAM) 50 MG tablet Take 1 tablet (50 mg total) by mouth every 12 (twelve) hours as needed for moderate pain or severe pain.  Marland Kitchen VALERIAN ROOT PO Take 1 capsule by mouth daily as needed (sleep).  . zolpidem (AMBIEN) 10 MG tablet Take 1 tablet (10 mg total) by mouth at bedtime as needed for sleep.   Facility-Administered Encounter Medications as of 02/29/2016  Medication  . testosterone cypionate (DEPOTESTOSTERONE CYPIONATE) injection 200 mg      Review of Systems  Constitutional: Negative.   HENT: Negative.   Eyes: Negative.   Respiratory: Negative.   Cardiovascular: Negative.   Gastrointestinal: Negative.   Endocrine: Negative.   Genitourinary: Negative.   Musculoskeletal: Negative.   Skin: Negative.   Allergic/Immunologic: Negative.   Neurological: Negative.   Hematological: Negative.  Psychiatric/Behavioral: Negative.        Objective:   Physical Exam  Constitutional: He is oriented to person, place, and time. He appears well-developed and well-nourished.  Cardiovascular: Normal rate, regular rhythm and normal heart sounds.   Pulmonary/Chest: Effort normal and breath sounds normal.  Abdominal: Soft.  Neurological: He is alert and oriented to person, place, and time.  Psychiatric: He has a normal mood and affect. His behavior  is normal.    BP 114/73 (BP Location: Left Arm)   Pulse 84   Temp 98.4 F (36.9 C) (Oral)   Ht 5\' 9"  (1.753 m)   Wt 204 lb (92.5 kg)   BMI 30.13 kg/m        Assessment & Plan:  1. S/P bilateral inguinal hernia repair He has an appointment on March 15 with surgeon in [place]  2. Major psychotic depression, recurrent ([place]) Continue in therapy. I think this is helped him tremendously. He is almost a different person  Wardell Honour MD

## 2016-03-16 ENCOUNTER — Ambulatory Visit (INDEPENDENT_AMBULATORY_CARE_PROVIDER_SITE_OTHER): Payer: Medicare Other | Admitting: Psychiatry

## 2016-03-16 DIAGNOSIS — F331 Major depressive disorder, recurrent, moderate: Secondary | ICD-10-CM | POA: Diagnosis not present

## 2016-03-16 NOTE — Progress Notes (Signed)
Patient:  Anthony [name].   DOB:     [birth year]  MR Number: [id number]  Location: [facility]:  [address] [place],  [place], [address]  Start: Friday 03/16/2016 11:17 AM  End Friday 03/16/2016 11:55 AM                    Provider/Observer:     Maurice Small, MSW, LCSW   Chief Complaint:      Chief Complaint  Patient presents with  . Depression    Reason For Service:     [name]. is a 66 y.o. male who says I am here because Dr. Modesta Messing referred me. I have depression. I think about suicide. I wouldn't do anything to myself because of my family. I am stressed with going to the doctors. I have been very weak for the past 1 1/2 years and I am falling behind in doing things.  Interventions Strategy:  Supportive  Participation Level:   Active  Participation Quality:  Appropriate      Behavioral Observation:  Well groomed, Alert, and Appropriate   Current Psychosocial Factors: Health, family issues  Content of Session:    reviewed symptoms, praised and reinforced patient's improved self-care efforts and use of daily planning, explored patient's interests, assisted patient identify ways to pursue increased social involvement based on his interests, assigned patient to research information about a dance program in which he has previously particiapated and enjoyed.   Current Status:   Less depressed mood, decreased worry, increased energy, improved motivation, increased interest in activities  Suicidal/Homicidal:   No  Patient Progress:   Good. Patient reports continued improved mood since last session. He hasn't been using daily planning as frequently but has remained engaged in activities. He also has tried to maintain positive self care. He has continued to socialize with his daughter and her family. He is looking forward to going to visit his other daughter and her family in [place] next weekend. He has maintained contact with adopted son. He expresses some sadness  his PCP is retiring but is positive about seeing another provider in the practice. , and reports increased involvement in activity.  Target Goals:   1. Learn and implement behavioral strategies to overcome depression.    2. Increase social involvement through learning and implementing interpersonal skills.  Last Reviewed:   02/01/2016  Goals Addressed Today:    1, 2  Impression/Diagnosis:   Patient presents with symptoms of depression that have been present most of his life per his report. Patient reports one psychiatric hosptialization which occurred at age 81 due to manic episode. He also participated in outpatient therapy for about a year when he was around 9 or 60. Patient also participated in outpatient therapy at [facility] intermittently for several years. Current symptoms include depressed mood, worry, fatigue, poor motivation, diminished interest/pleasure in activities.    Diagnosis:  Axis I: MDD, Recurrent          Axis II: Deferred    Erinn Mendosa, LCSW 03/16/2016

## 2016-03-22 ENCOUNTER — Encounter: Payer: Self-pay | Admitting: General Surgery

## 2016-03-22 ENCOUNTER — Ambulatory Visit (INDEPENDENT_AMBULATORY_CARE_PROVIDER_SITE_OTHER): Payer: Medicare Other | Admitting: General Surgery

## 2016-03-22 VITALS — BP 123/65 | HR 65 | Temp 98.6°F | Resp 18 | Ht 70.0 in | Wt 204.0 lb

## 2016-03-22 DIAGNOSIS — K4091 Unilateral inguinal hernia, without obstruction or gangrene, recurrent: Secondary | ICD-10-CM

## 2016-03-22 NOTE — Patient Instructions (Signed)

## 2016-03-22 NOTE — Progress Notes (Signed)
Anthony [name].; [id number]; [birth year]   HPI Patient is a 66 year old white male who was referred to my care for second opinion concerning a recurrent left inguinal hernia. He has had both a laparoscopic and open approach to repair his left inguinal hernia in the recent past. He states he did follow-up with his surgeon who did an ultrasound in January 2018 and found that the left inguinal hernia had recurred. It only contained adipose tissue. No bowel was present. Patient states that he continues to have some scrotal swelling. No significant pain is noted. He denies any nausea or vomiting. Sometimes the swelling appears worse when lifting something heavy. It is not affecting his daily lifestyle at this time. Past Medical History:  Diagnosis Date  . Anxiety   . Arthritis   . Bipolar disorder ([place])   . CAD (coronary artery disease)    a. INF STEMI 07/04/10:  tx with thrombectomy + [facility] to [facility];  cath 07/04/10: dLM 10-20%, pLAD 40-50%, mLAD 20-30%, pRCA 30%, mRCA occluded and tx with PCI, EF 50% with inf HK. A Multilink  . COPD (chronic obstructive pulmonary disease) ([place])   . Depression   . Glucose intolerance (impaired glucose tolerance)    A1c 6.2 06/2010  . History of DVT (deep vein thrombosis)    traumatic, s/p coumadin tx.  Marland Kitchen HLD (hyperlipidemia)   . Hypertension    pt states is currently on no medications  . Myocardial infarction    around 2013  . Shortness of breath    walking distance or climbing stairs  . Tobacco abuse   . Urinary frequency     Past Surgical History:  Procedure Laterality Date  . CARDIAC CATHETERIZATION    . COLONOSCOPY N/A 12/29/2015   Procedure: COLONOSCOPY;  Surgeon: Rogene Houston, MD;  [facility];  Service: Endoscopy;  Laterality: N/A;  12:55  . ELECTROCARDIOGRAM     Showed inferolateral ST-elevation and a code STEMI was activated. In the ER, he was treated with morphine, herarin, and 600 mg of Plavix. He was transferred emergently to  [facility].   . INGUINAL HERNIA REPAIR Left 07/26/2014   Procedure: OPEN REPAIR OF RECURENT LEFT INGUINAL HERNIA REPAIR WITH MESH;  Surgeon: Greer Pickerel, MD;  Location: [facility] ORS;  Service: General;  Laterality: Left;  . INSERTION OF MESH Bilateral 10/30/2013   Procedure: INSERTION OF MESH;  Surgeon: Gayland Curry, MD;  Location: [facility];  Service: General;  Laterality: Bilateral;  . LAPAROSCOPIC INGUINAL HERNIA WITH UMBILICAL HERNIA Bilateral 10/30/2013   Procedure: laparoscopic repair left pantaloom hernia with mesh, laparoscopic right inguinal hernia with mesh, OPEN REPAIR OF UMBILICAL HERNIA ;  Surgeon: Gayland Curry, MD;  Location: [facility];  Service: General;  Laterality: Bilateral;  . LAPAROSCOPY N/A 07/26/2014   Procedure: LAPAROSCOPY DIAGNOSTIC;  Surgeon: Greer Pickerel, MD;  Location: [facility];  Service: General;  Laterality: N/A;  . POLYPECTOMY  12/29/2015   Procedure: POLYPECTOMY;  Surgeon: Rogene Houston, MD;  [facility];  Service: Endoscopy;;  ascending colon, descending colon  . stent placement    . TOTAL HIP ARTHROPLASTY Right 05/10/2015   Procedure: RIGHT TOTAL HIP ARTHROPLASTY ANTERIOR APPROACH;  Surgeon: Paralee Cancel, MD;  Location: [facility];  Service: Orthopedics;  Laterality: Right;  Marland Kitchen VASECTOMY      Family History  Problem Relation Age of Onset  . Coronary artery disease Father   . Depression Father   . Depression Mother  Current Outpatient Prescriptions on File Prior to Visit  Medication Sig Dispense Refill  . 5-Hydroxytryptophan (5-HTP PO) Take 1 tablet by mouth at bedtime.    Marland Kitchen aspirin EC 81 MG tablet Take 1 tablet (81 mg total) by mouth daily. 90 tablet 3  . Cyanocobalamin (VITAMIN B-12 PO) Take 1,000 mg by mouth daily as needed.    Marland Kitchen glucosamine-chondroitin 500-400 MG tablet Take 1 tablet by mouth as needed (joint pain).     Marland Kitchen lovastatin (MEVACOR) 40 MG tablet Take 1 tablet (40 mg total) by mouth at bedtime. 30 tablet 11  . MELATONIN PO Take 2  tablets by mouth at bedtime as needed (sleep).    . metoprolol tartrate (LOPRESSOR) 25 MG tablet Take 0.5 tablets (12.5 mg total) by mouth 2 (two) times daily. 30 tablet 11  . mirtazapine (REMERON) 30 MG tablet Take 1 tablet (30 mg total) by mouth at bedtime. 30 tablet 1  . multivitamin-lutein (OCUVITE-LUTEIN) CAPS capsule Take 1 capsule by mouth every other day.    . naproxen sodium (ANAPROX) 220 MG tablet Take 220 mg by mouth 2 (two) times daily as needed (pain).     . Probiotic Product (PROBIOTIC-10 PO) Take by mouth.    . psyllium (METAMUCIL) 58.6 % powder Take 1 packet by mouth daily as needed (constipation).    . sennosides-docusate sodium (SENOKOT-S) 8.6-50 MG tablet Take 1 tablet by mouth daily as needed for constipation.    . sodium chloride (OCEAN) 0.65 % SOLN nasal spray Place 1 spray into both nostrils as needed for congestion.    Marland Kitchen tetrahydrozoline 0.05 % ophthalmic solution Place 2-3 drops into both eyes 4 (four) times daily as needed (For eye redness.).    Marland Kitchen traMADol (ULTRAM) 50 MG tablet Take 1 tablet (50 mg total) by mouth every 12 (twelve) hours as needed for moderate pain or severe pain. 30 tablet 0  . VALERIAN ROOT PO Take 1 capsule by mouth daily as needed (sleep).    . zolpidem (AMBIEN) 10 MG tablet Take 1 tablet (10 mg total) by mouth at bedtime as needed for sleep. 30 tablet 2   Current Facility-Administered Medications on File Prior to Visit  Medication Dose Route Frequency Provider Last Rate Last Dose  . testosterone cypionate (DEPOTESTOSTERONE CYPIONATE) injection 200 mg  200 mg Intramuscular Q28 days Wardell Honour, MD   200 mg at 10/25/15 1556    Allergies  Allergen Reactions  . Flomax [Tamsulosin Hcl] Other (See Comments)    This medication makes patient feel sick  . Penicillins Other (See Comments)    Reaction unknown occurred during childhood Has patient had a PCN reaction causing immediate rash, facial/tongue/throat swelling, SOB or lightheadedness with  hypotension: unsure - childhood reaction Has patient had a PCN reaction causing severe rash involving mucus membranes or skin necrosis: unsure - childhood reaction Has patient had a PCN reaction that required hospitalization no Has patient had a PCN reaction occurring within the last 10 years: no If all of the above answers are "NO", then may p    History  Alcohol Use No    Comment: past hx of ETOH use was in inpt facility per court order  - 20 years, 10-18-2015 per pt not anymore,per pt stopped about 15-20 yrs ago    History  Smoking Status  . Current Every Day Smoker  . Packs/day: 2.00  . Years: 45.00  . Types: Cigarettes  Smokeless Tobacco  . Never Used    Review of Systems  Constitutional: Negative.   HENT: Negative.   Eyes: Negative.   Respiratory: Negative.   Cardiovascular: Negative.   Gastrointestinal: Negative.   Genitourinary: Negative.   Musculoskeletal: Negative.   Skin: Negative.   Neurological: Negative.   Endo/Heme/Allergies: Negative.   Psychiatric/Behavioral: Negative.     Objective   Vitals:   03/22/16 0949  BP: 123/65  Pulse: 65  Resp: 18  Temp: 98.6 F (37 C)    Physical Exam  Constitutional: He is oriented to person, place, and time and well-developed, well-nourished, and in no distress.  HENT:  Head: Normocephalic and atraumatic.  Neck: Normal range of motion. Neck supple.  Cardiovascular: Normal rate, regular rhythm and normal heart sounds.   No murmur heard. Pulmonary/Chest: Effort normal and breath sounds normal. He has no wheezes. He has no rales.  Abdominal: Soft. Bowel sounds are normal. He exhibits no distension. There is no tenderness. There is no rebound.  Small reducible left inguinal hernia.  Genitourinary:  Genitourinary Comments: Scrotal skin is thickened. No hydrocele appreciated.  Neurological: He is alert and oriented to person, place, and time.  Skin: Skin is warm and dry.  Vitals reviewed.  Dr. Ammie Ferrier notes  reviewed. Operative notes reviewed. Ultrasound report reviewed.  Assessment  Recurrent left inguinal hernia, asymptomatic Plan   I had a long discussion with the patient concerning his recurrent left inguinal hernia. As he is not symptomatic, I told him that there was no need for acute surgical intervention at this time. Reoperating on his hernia does increase the risk of recurrence and left testicular atrophy. I would only want to do the surgery once he was symptomatic. He understands and agrees. He was told to return to the office should he become symptomatic.

## 2016-03-27 ENCOUNTER — Other Ambulatory Visit: Payer: Medicare Other | Admitting: *Deleted

## 2016-03-27 DIAGNOSIS — E7849 Other hyperlipidemia: Secondary | ICD-10-CM

## 2016-03-27 DIAGNOSIS — E784 Other hyperlipidemia: Secondary | ICD-10-CM | POA: Diagnosis not present

## 2016-03-27 NOTE — Addendum Note (Signed)
Addended by: Eulis Foster on: 03/27/2016 10:10 AM   Modules accepted: Orders

## 2016-03-27 NOTE — Addendum Note (Signed)
Addended by: Eulis Foster on: 03/27/2016 10:09 AM   Modules accepted: Orders

## 2016-03-28 ENCOUNTER — Other Ambulatory Visit: Payer: Self-pay | Admitting: *Deleted

## 2016-03-28 DIAGNOSIS — E7849 Other hyperlipidemia: Secondary | ICD-10-CM

## 2016-03-28 LAB — HEPATIC FUNCTION PANEL
ALT: 24 IU/L (ref 0–44)
AST: 19 IU/L (ref 0–40)
Albumin: 4.5 g/dL (ref 3.6–4.8)
Alkaline Phosphatase: 71 IU/L (ref 39–117)
Bilirubin Total: 0.3 mg/dL (ref 0.0–1.2)
Bilirubin, Direct: 0.1 mg/dL (ref 0.00–0.40)
TOTAL PROTEIN: 7.5 g/dL (ref 6.0–8.5)

## 2016-03-28 LAB — LIPID PANEL
CHOLESTEROL TOTAL: 204 mg/dL — AB (ref 100–199)
Chol/HDL Ratio: 5.8 ratio units — ABNORMAL HIGH (ref 0.0–5.0)
HDL: 35 mg/dL — AB (ref >39)
LDL Calculated: 128 mg/dL — ABNORMAL HIGH (ref 0–99)
Triglycerides: 206 mg/dL — ABNORMAL HIGH (ref 0–149)
VLDL CHOLESTEROL CAL: 41 mg/dL — AB (ref 5–40)

## 2016-03-28 MED ORDER — ATORVASTATIN CALCIUM 40 MG PO TABS: 40.0000 mg | ORAL_TABLET | Freq: Every day | ORAL | 11 refills | Status: DC

## 2016-04-04 ENCOUNTER — Ambulatory Visit (INDEPENDENT_AMBULATORY_CARE_PROVIDER_SITE_OTHER): Payer: Medicare Other | Admitting: Psychiatry

## 2016-04-04 DIAGNOSIS — F331 Major depressive disorder, recurrent, moderate: Secondary | ICD-10-CM

## 2016-04-04 NOTE — Progress Notes (Signed)
Patient:  Anthony [name].   DOB:     24-May-1950  MR Number: [id number]  Location: [facility]:  [address] [place],  [place], [address]  Start: Wednesday 04/04/2016  10:58 AM  End Wednesday 04/04/2016  11:53  AM                      Provider/Observer:     Maurice Small, MSW, LCSW   Chief Complaint:      Chief Complaint  Patient presents with  . Depression    Reason For Service:     Jotham Ahn. is a 66 y.o. male who says I am here because Dr. Modesta Messing referred me. I have depression. I think about suicide. I wouldn't do anything to myself because of my family. I am stressed with going to the doctors. I have been very weak for the past 1 1/2 years and I am falling behind in doing things.  Interventions Strategy:  Supportive/CBT  Participation Level:   Active  Participation Quality:  Appropriate      Behavioral Observation:  casual, Alert, and depressed  Current Psychosocial Factors: Health, family issues  Content of Session:    reviewed symptoms, provided psychoeducation regarding depression,assisted patient identify triggers of increased depressed mood,  discussed distinction between lapse and relapse, assisted patient identify early warning signs of depression and coping strategies to intervene, began to discuss connection between thoughts/mood/behavior,  Current Status:   Increased depressed mood, increased worry, decreased energy, poor motivation,decreased interest in activities  Suicidal/Homicidal:   No  Patient Progress:   [name] Patient reports increased depressed mood, feelings of hopelessness/helplessness since last session. He reports this seemed to be triggered by being unable to accomplish tasks at his home that he would like to accomplish. He reports he began to make negative comments about self. He did visit daughter in [place] but reports experiencing some symptoms of depression during visit. He has been less motivated and reports decreased involvement  in activities.    Target Goals:   1. Learn and implement behavioral strategies to overcome depression.    2. Increase social involvement through learning and implementing interpersonal skills.  Last Reviewed:   02/01/2016  Goals Addressed Today:    1, 2  Impression/Diagnosis:   Patient presents with symptoms of depression that have been present most of his life per his report. Patient reports one psychiatric hosptialization which occurred at age 39 due to manic episode. He also participated in outpatient therapy for about a year when he was around 51 or 29. Patient also participated in outpatient therapy at [facility] intermittently for several years. Current symptoms include depressed mood, worry, fatigue, poor motivation, diminished interest/pleasure in activities.    Diagnosis:  Axis I: MDD, Recurrent          Axis II: Deferred    Johnell Bas, LCSW 04/04/2016

## 2016-04-13 ENCOUNTER — Encounter: Payer: Self-pay | Admitting: Family

## 2016-04-13 ENCOUNTER — Ambulatory Visit (INDEPENDENT_AMBULATORY_CARE_PROVIDER_SITE_OTHER): Payer: Medicare Other | Admitting: Family

## 2016-04-13 VITALS — BP 116/81 | HR 81 | Temp 97.2°F | Ht 69.0 in | Wt 206.0 lb

## 2016-04-13 DIAGNOSIS — E669 Obesity, unspecified: Secondary | ICD-10-CM

## 2016-04-13 DIAGNOSIS — M25551 Pain in right hip: Secondary | ICD-10-CM | POA: Diagnosis not present

## 2016-04-13 DIAGNOSIS — G4700 Insomnia, unspecified: Secondary | ICD-10-CM

## 2016-04-13 DIAGNOSIS — F17200 Nicotine dependence, unspecified, uncomplicated: Secondary | ICD-10-CM | POA: Diagnosis not present

## 2016-04-13 MED ORDER — ZOLPIDEM TARTRATE 10 MG PO TABS: 10.0000 mg | ORAL_TABLET | Freq: Every evening | ORAL | 2 refills | Status: DC | PRN

## 2016-04-13 NOTE — Patient Instructions (Signed)
Bursitis Bursitis is inflammation and irritation of a bursa, which is one of the small, fluid-filled sacs that cushion and protect the moving parts of your body. These sacs are located between bones and muscles, muscle attachments, or skin areas next to bones. A bursa protects these structures from the wear and tear that results from frequent movement. An inflamed bursa causes pain and swelling. Fluid may build up inside the sac. Bursitis is most common near joints, especially the knees, elbows, hips, and shoulders. What are the causes? Bursitis can be caused by:  Injury from:  A direct blow, like falling on your knee or elbow.  Overuse of a joint (repetitive stress).  Infection. This can happen if bacteria gets into a bursa through a cut or scrape near a joint.  Diseases that cause joint inflammation, such as gout and rheumatoid arthritis. What increases the risk? You may be at risk for bursitis if you:  Have a job or hobby that involves a lot of repetitive stress on your joints.  Have a condition that weakens your body's defense system (immune system), such as diabetes, cancer, or HIV.  Lift and reach overhead often.  Kneel or lean on hard surfaces often.  Run or walk often. What are the signs or symptoms? The most common signs and symptoms of bursitis are:  Pain that gets worse when you move the affected body part or put weight on it.  Inflammation.  Stiffness. Other signs and symptoms may include:  Redness.  Tenderness.  Warmth.  Pain that continues after rest.  Fever and chills. This may occur in bursitis caused by infection. How is this diagnosed? Bursitis may be diagnosed by:  Medical history and physical exam.  MRI.  A procedure to drain fluid from the bursa with a needle (aspiration). The fluid may be checked for signs of infection or gout.  Blood tests to rule out other causes of inflammation. How is this treated? Bursitis can usually be treated at  home with rest, ice, compression, and elevation (RICE). For mild bursitis, RICE treatment may be all you need. Other treatments may include:  Nonsteroidal anti-inflammatory drugs (NSAIDs) to treat pain and inflammation.  Corticosteroids to fight inflammation. You may have these drugs injected into and around the area of bursitis.  Aspiration of bursitis fluid to relieve pain and improve movement.  Antibiotic medicine to treat an infected bursa.  A splint, brace, or walking aid.  Physical therapy if you continue to have pain or limited movement.  Surgery to remove a damaged or infected bursa. This may be needed if you have a very bad case of bursitis or if other treatments have not worked. Follow these instructions at home:  Take medicines only as directed by your health care provider.  If you were prescribed an antibiotic medicine, finish it all even if you start to feel better.  Rest the affected area as directed by your health care provider.  Keep the area elevated.  Avoid activities that make pain worse.  Apply ice to the injured area:  Place ice in a plastic bag.  Place a towel between your skin and the bag.  Leave the ice on for 20 minutes, 2-3 times a day.  Use splints, braces, pads, or walking aids as directed by your health care provider.  Keep all follow-up visits as directed by your health care provider. This is important. How is this prevented?  Wear knee pads if you kneel often.  Wear sturdy running or walking shoes  that fit you well.  Take regular breaks from repetitive activity.  Warm up by stretching before doing any strenuous activity.  Maintain a healthy weight or lose weight as recommended by your health care provider. Ask your health care provider if you need help.  Exercise regularly. Start any new physical activity gradually. Contact a health care provider if:  Your bursitis is not responding to treatment or home care.  You have a  fever.  You have chills. This information is not intended to replace advice given to you by your health care provider. Make sure you discuss any questions you have with your health care provider. Document Released: 12/23/1999 Document Revised: 06/02/2015 Document Reviewed: 03/16/2013 Elsevier Interactive Patient Education  2017 [employer].

## 2016-04-13 NOTE — Progress Notes (Signed)
   Subjective:    Patient ID: Anthony Form., male    DOB: [birth year], 66 y.o.   MRN: [id number]   HPI Pt presents to the office today right hip pain. PT states this hip pain has improved.PT states three weeks ago he traveled to [place], [place] and was in a car for an extended period of time. PT states has 0 out 10 pain in his hip now, but when he walks over a mile he has 7-8 out 10. Pt has tried Aleve with moderate relief.    Requesting refill on Ambien for insomnia. Stable at this time.    Review of Systems  Musculoskeletal: Positive for arthralgias.  All other systems reviewed and are negative.      Objective:   Physical Exam  Constitutional: He is oriented to person, place, and time. He appears well-developed and well-nourished. No distress.  HENT:  Head: Normocephalic.  Cardiovascular: Normal rate, regular rhythm, normal heart sounds and intact distal pulses.   No murmur heard. Pulmonary/Chest: Effort normal and breath sounds normal. No respiratory distress. He has no wheezes.  Musculoskeletal: Normal range of motion. He exhibits no edema or tenderness.  Neurological: He is alert and oriented to person, place, and time.  Skin: Skin is warm and dry. No rash noted. No erythema.  Psychiatric: He has a normal mood and affect. His behavior is normal. Judgment and thought content normal.  Vitals reviewed.     BP 116/81   Pulse 81   Temp 97.2 F (36.2 C) (Oral)   Ht 5\' 9"  (1.753 m)   Wt 206 lb (93.4 kg)   BMI 30.42 kg/m      Assessment & Plan:  1. Right hip pain -Continue Aleve Rest Continue ROM exercises Ice and heat  2. Current smoker -Smoking cessation discussed  3. Obesity (BMI 30-39.9)  4. Insomnia, unspecified type -Sleep ritual discussed - zolpidem (AMBIEN) 10 MG tablet; Take 1 tablet (10 mg total) by mouth at bedtime as needed for sleep.  Dispense: 30 tablet; Refill: [place], FNP

## 2016-04-17 NOTE — Progress Notes (Deleted)
[place] MD/PA/NP OP Progress Note  04/17/2016 12:38 PM Anthony [name].  MRN:  [id number]  Chief Complaint:   Subjective:  "much better" HPI:  Patient presents for follow up appointment. He states that he is doing much better since the last appointment. Although there are days he feels depressed, it is not as frequent as it used to be. He denies SI. He prioritize walking twice a day and is careful of his diet. He reports his stress of the need to find a new PCP as the one will be retired. He endorses insomnia, sleeping 3-4 hours at times; he believes it has been getting better. He takes Tramadol at times for his pain.   Visit Diagnosis:  No diagnosis found.  Past Psychiatric History:  Outpatient: not since 2014 [facility]states he has "manic" ), [place] in 12/9-12/15/2014 for depression with SI Previous suicide attempt: Patient has a history of suicidal attempt in 1995 when he overdosed on sleeping pills. Past trials of medication: Wellbutrin, Cymbalta, sertraline, Trazodone  Past Medical History:  Past Medical History:  Diagnosis Date  . Anxiety   . Arthritis   . Bipolar disorder ([place])   . CAD (coronary artery disease)    a. INF STEMI 07/04/10:  tx with thrombectomy + [facility] to [facility];  cath 07/04/10: dLM 10-20%, pLAD 40-50%, mLAD 20-30%, pRCA 30%, mRCA occluded and tx with PCI, EF 50% with inf HK. A Multilink  . COPD (chronic obstructive pulmonary disease) ([place])   . Depression   . Glucose intolerance (impaired glucose tolerance)    A1c 6.2 06/2010  . History of DVT (deep vein thrombosis)    traumatic, s/p coumadin tx.  Marland Kitchen HLD (hyperlipidemia)   . Hypertension    pt states is currently on no medications  . Myocardial infarction    around 2013  . Shortness of breath    walking distance or climbing stairs  . Tobacco abuse   . Urinary frequency     Past Surgical History:  Procedure Laterality Date  . CARDIAC CATHETERIZATION    .  COLONOSCOPY N/A 12/29/2015   Procedure: COLONOSCOPY;  Surgeon: Rogene Houston, MD;  Location: [facility];  Service: Endoscopy;  Laterality: N/A;  12:55  . ELECTROCARDIOGRAM     Showed inferolateral ST-elevation and a code STEMI was activated. In the ER, he was treated with morphine, herarin, and 600 mg of Plavix. He was transferred emergently to [facility].   . INGUINAL HERNIA REPAIR Left 07/26/2014   Procedure: OPEN REPAIR OF RECURENT LEFT INGUINAL HERNIA REPAIR WITH MESH;  Surgeon: Greer Pickerel, MD;  Location: [facility];  Service: General;  Laterality: Left;  . INSERTION OF MESH Bilateral 10/30/2013   Procedure: INSERTION OF MESH;  Surgeon: Gayland Curry, MD;  Location: [facility];  Service: General;  Laterality: Bilateral;  . LAPAROSCOPIC INGUINAL HERNIA WITH UMBILICAL HERNIA Bilateral 10/30/2013   Procedure: laparoscopic repair left pantaloom hernia with mesh, laparoscopic right inguinal hernia with mesh, OPEN REPAIR OF UMBILICAL HERNIA ;  Surgeon: Gayland Curry, MD;  Location: [facility];  Service: General;  Laterality: Bilateral;  . LAPAROSCOPY N/A 07/26/2014   Procedure: LAPAROSCOPY DIAGNOSTIC;  Surgeon: Greer Pickerel, MD;  Location: [facility];  Service: General;  Laterality: N/A;  . POLYPECTOMY  12/29/2015   Procedure: POLYPECTOMY;  Surgeon: Rogene Houston, MD;  [facility]: [facility];  Service: Endoscopy;;  ascending colon, descending colon  . stent placement    .  TOTAL HIP ARTHROPLASTY Right 05/10/2015   Procedure: RIGHT TOTAL HIP ARTHROPLASTY ANTERIOR APPROACH;  Surgeon: Paralee Cancel, MD;  Location: [facility];  Service: Orthopedics;  Laterality: Right;  Marland Kitchen VASECTOMY      Family Psychiatric History: Denies (Details unknown),   Family History:  Family History  Problem Relation Age of Onset  . Coronary artery disease Father   . Depression Father   . Depression Mother     Social History:  Social History   Social History  . Marital status: Divorced    Spouse name: N/A  . Number of  children: N/A  . Years of education: N/A   Social History Main Topics  . Smoking status: Current Every Day Smoker    Packs/day: 2.00    Years: 45.00    Types: Cigarettes  . Smokeless tobacco: Never Used  . Alcohol use No     Comment: past hx of ETOH use was in inpt facility per court order  - 20 years, 10-18-2015 per pt not anymore,per pt stopped about 15-20 yrs ago  . Drug use: No     Comment: snorted heroin and cocaine, 10-18-2015 per pt in the past he did Marijuana only and nothing else  . Sexual activity: Not on file   Other Topics Concern  . Not on file   Social History Narrative   Lives in [place]. He lives alone. He has kids but is not married.     Allergies:  Allergies  Allergen Reactions  . Flomax [Tamsulosin Hcl] Other (See Comments)    This medication makes patient feel sick  . Penicillins Other (See Comments)    Reaction unknown occurred during childhood Has patient had a PCN reaction causing immediate rash, facial/tongue/throat swelling, SOB or lightheadedness with hypotension: unsure - childhood reaction Has patient had a PCN reaction causing severe rash involving mucus membranes or skin necrosis: unsure - childhood reaction Has patient had a PCN reaction that required hospitalization no Has patient had a PCN reaction occurring within the last 10 years: no If all of the above answers are "NO", then may p    Metabolic Disorder Labs: Lab Results  Component Value Date   HGBA1C 6.2 (H) 07/05/2010   MPG 131 (H) 07/05/2010   No results found for: PROLACTIN Lab Results  Component Value Date   CHOL 204 (H) 03/27/2016   TRIG 206 (H) 03/27/2016   HDL 35 (L) 03/27/2016   CHOLHDL 5.8 (H) 03/27/2016   VLDL 65 (H) 01/03/2016   LDLCALC 128 (H) 03/27/2016   LDLCALC 89 01/03/2016     Current Medications: Current Outpatient Prescriptions  Medication Sig Dispense Refill  . 5-Hydroxytryptophan (5-HTP PO) Take 1 tablet by mouth at bedtime.    Marland Kitchen aspirin EC 81 MG  tablet Take 1 tablet (81 mg total) by mouth daily. 90 tablet 3  . atorvastatin (LIPITOR) 40 MG tablet Take 1 tablet (40 mg total) by mouth daily. 30 tablet 11  . Cyanocobalamin (VITAMIN B-12 PO) Take 1,000 mg by mouth daily as needed.    Marland Kitchen glucosamine-chondroitin 500-400 MG tablet Take 1 tablet by mouth as needed (joint pain).     . MELATONIN PO Take 2 tablets by mouth at bedtime as needed (sleep).    . metoprolol tartrate (LOPRESSOR) 25 MG tablet Take 0.5 tablets (12.5 mg total) by mouth 2 (two) times daily. 30 tablet 11  . mirtazapine (REMERON) 30 MG tablet Take 1 tablet (30 mg total) by mouth at bedtime. 30 tablet 1  . multivitamin-lutein (  OCUVITE-LUTEIN) CAPS capsule Take 1 capsule by mouth every other day.    . naproxen sodium (ANAPROX) 220 MG tablet Take 220 mg by mouth 2 (two) times daily as needed (pain).     . Probiotic Product (PROBIOTIC-10 PO) Take by mouth.    . psyllium (METAMUCIL) 58.6 % powder Take 1 packet by mouth daily as needed (constipation).    . sennosides-docusate sodium (SENOKOT-S) 8.6-50 MG tablet Take 1 tablet by mouth daily as needed for constipation.    . sodium chloride (OCEAN) 0.65 % SOLN nasal spray Place 1 spray into both nostrils as needed for congestion.    Marland Kitchen tetrahydrozoline 0.05 % ophthalmic solution Place 2-3 drops into both eyes 4 (four) times daily as needed (For eye redness.).    Marland Kitchen traMADol (ULTRAM) 50 MG tablet Take 1 tablet (50 mg total) by mouth every 12 (twelve) hours as needed for moderate pain or severe pain. 30 tablet 0  . VALERIAN ROOT PO Take 1 capsule by mouth daily as needed (sleep).    . zolpidem (AMBIEN) 10 MG tablet Take 1 tablet (10 mg total) by mouth at bedtime as needed for sleep. 30 tablet 2   Current Facility-Administered Medications  Medication Dose Route Frequency Provider Last Rate Last Dose  . testosterone cypionate (DEPOTESTOSTERONE CYPIONATE) injection 200 mg  200 mg Intramuscular Q28 days Wardell Honour, MD   200 mg at 10/25/15  1556    Neurologic: Headache: No Seizure: No Paresthesias: No  Musculoskeletal: Strength & Muscle Tone: within normal limits Gait & Station: normal Patient leans: N/A  Psychiatric Specialty Exam: Review of Systems  Gastrointestinal: Negative for blood in stool.  Neurological: Positive for weakness.  Psychiatric/Behavioral: Positive for depression. Negative for hallucinations, substance abuse and suicidal ideas. The patient is nervous/anxious and has insomnia.   All other systems reviewed and are negative.   There were no vitals taken for this visit.There is no height or weight on file to calculate BMI.  General Appearance: Casual  Eye Contact:  Good  Speech:  Clear and Coherent  Volume:  Normal  Mood:  "much better"  Affect:  down- improving  Thought Process:  Coherent and Goal Directed  Orientation:  Full (Time, Place, and Person)  Thought Content: Logical  Perceptions: denies AH/VH  Suicidal Thoughts:  No  Homicidal Thoughts:  No  Memory:  Immediate;   Good Recent;   Good Remote;   Good  Judgement:  Fair  Insight:  Fair  Psychomotor Activity:  Normal  Concentration:  Concentration: Good and Attention Span: Good  Recall:  Good  Fund of Knowledge: Good  Language: Good  Akathisia:  NA  Handed:  Right  AIMS (if indicated):  N/A  Assets:  Communication Skills Desire for Improvement  ADL's:  Intact  Cognition: WNL  Sleep:  poor   Assessment 66yo male with bipolar disorder per chart, CAD, HLD, tobacco abuse, COPD, DVT 2008, S/P right THA, AA, s/p bilateral inguinal hernia repair, who presented to the clinic for depression. Psychosocial stressors including divorce and financial strain.   # MDD  R/o MDD with mixed episode There has been significant improvement in his neurovegetative symptoms since starting mirtazapine.He keeps behavioral activation and it helps him to have sense of autonomy. Will continue current medication. He will continue to see Ms. Bynum for  therapy. Noted that although he had periods of hypomanic episode post operation, it is likely related to anesthetic use;  will closely monitor any manic symptoms.   Plan 1. Continue  mirtazapine 30 mg at night 2. Return to clinic in two months 3. Patient to see Ms. Bynum for therapy (He is prescribed Zolpidem 10 mg at night by his PCP)  The patient demonstrates the following  risk factors for suicide: Chronic risk factors for suicide include psychiatric disorder,  medical illness, previous suicide attempt, demographic factors (male, >74 yo). Acute risk factors for suicide include family or marital conflict, unemployment, social withdrawal/isolation, medical problems.  Protective factors for this patient include positive social support, hope for the future, being amenable to [facility] treatment, seeking for help.  Considering these factors, the overall suicide risk at this point appears to be low. Emergency resources including 911, ED, suicide hotline was discussed.   Treatment Plan Summary: Medication management  Norman Clay, MD 04/17/2016, 12:38 PM

## 2016-04-19 ENCOUNTER — Ambulatory Visit (HOSPITAL_COMMUNITY): Payer: Self-pay | Admitting: Psychiatry

## 2016-04-23 ENCOUNTER — Encounter (HOSPITAL_COMMUNITY): Payer: Self-pay | Admitting: Psychiatry

## 2016-04-23 ENCOUNTER — Ambulatory Visit (INDEPENDENT_AMBULATORY_CARE_PROVIDER_SITE_OTHER): Payer: Medicare Other | Admitting: Psychiatry

## 2016-04-23 DIAGNOSIS — F331 Major depressive disorder, recurrent, moderate: Secondary | ICD-10-CM | POA: Diagnosis not present

## 2016-04-23 NOTE — Progress Notes (Signed)
Patient:  Anthony [name].   DOB:     09/14/1950  MR Number: [id number]  Location: [facility]:  [address] [place],  [place], [address]    Start: Monday 04/23/2016 11:02 AM End Monday 04/23/2016 11:56 AM            Provider/Observer:     Maurice Small, MSW, LCSW   Chief Complaint:      Chief Complaint  Patient presents with  . Depression    Reason For Service:     [name]. is a 66 y.o. male who says I am here because Dr. Modesta Messing referred me. I have depression. I think about suicide. I wouldn't do anything to myself because of my family. I am stressed with going to the doctors. I have been very weak for the past 1 1/2 years and I am falling behind in doing things.  Interventions Strategy:  Supportive/CBT  Participation Level:   Active  Participation Quality:  Appropriate      Behavioral Observation:  casual, Alert, and depressed  Current Psychosocial Factors: Health, family issues  Content of Session:    reviewed symptoms, assisted patient identify possible triggers of increased depressed mood, discussed cycle of depression and ways to intervene,  assisted patient identify ways to increase behavioral activation with use of daily planning, reviewed connection between thoughts/mood/behavior using examples from patient's life, assisted patient identify ways to improve sleep hygiene, discussed scheduling appointment with psychiatrist Dr. Modesta Messing for medication management.   Current Status:   Increased depressed mood, increased worry, decreased energy, poor motivation,decreased interest in activities, poor concentration, indecisiveness, insomnia.  Suicidal/Homicidal:   No   Patient Progress:   Fair Patient reports continued depressed mood, feelings of hopelessness/helplessness along with decreased interest in activities since last session. He admits he did experience passive SI with no plan and no intent. He reports increased sleep difficulty. He has sleep aid  Ambien but has been afraid to use due to side effects regarding his balance. He also reports increased worry about his health due to hernia issue that has not been addressed. He expresses ambivalent feelings about exploring possible solutions as he is reluctant to have another surgery due to previous experience with a previous hernia repair. He admits increased negative and judgmental thinking about self.    Target Goals:   1. Learn and implement behavioral strategies to overcome depression.    2. Increase social involvement through learning and implementing interpersonal skills.  Last Reviewed:   02/01/2016  Goals Addressed Today:    1, 2  Plan:   Return in 2 weeks. Patient agrees to implement strategies discussed in session.  Patient missed appointment with Dr. Modesta Messing last week and agrees to reschedule.   Impression/Diagnosis:   Patient presents with symptoms of depression that have been present most of his life per his report. Patient reports one psychiatric hosptialization which occurred at age 1 due to manic episode. He also participated in outpatient therapy for about a year when he was around 50 or 26. Patient also participated in outpatient therapy at [facility] intermittently for several years. Current symptoms include depressed mood, worry, fatigue, poor motivation, diminished interest/pleasure in activities.    Diagnosis:  Axis I: MDD, Recurrent          Axis II: Deferred    Saharah Sherrow, LCSW 04/23/2016

## 2016-04-25 ENCOUNTER — Ambulatory Visit (INDEPENDENT_AMBULATORY_CARE_PROVIDER_SITE_OTHER): Payer: Medicare Other | Admitting: Psychiatry

## 2016-04-25 ENCOUNTER — Encounter (HOSPITAL_COMMUNITY): Payer: Self-pay | Admitting: Psychiatry

## 2016-04-25 VITALS — BP 111/74 | HR 78 | Ht 69.0 in | Wt 206.0 lb

## 2016-04-25 DIAGNOSIS — I2510 Atherosclerotic heart disease of native coronary artery without angina pectoris: Secondary | ICD-10-CM | POA: Diagnosis not present

## 2016-04-25 DIAGNOSIS — F17210 Nicotine dependence, cigarettes, uncomplicated: Secondary | ICD-10-CM

## 2016-04-25 DIAGNOSIS — Z818 Family history of other mental and behavioral disorders: Secondary | ICD-10-CM | POA: Diagnosis not present

## 2016-04-25 DIAGNOSIS — F331 Major depressive disorder, recurrent, moderate: Secondary | ICD-10-CM

## 2016-04-25 DIAGNOSIS — E785 Hyperlipidemia, unspecified: Secondary | ICD-10-CM | POA: Diagnosis not present

## 2016-04-25 DIAGNOSIS — Z7982 Long term (current) use of aspirin: Secondary | ICD-10-CM

## 2016-04-25 DIAGNOSIS — J449 Chronic obstructive pulmonary disease, unspecified: Secondary | ICD-10-CM | POA: Diagnosis not present

## 2016-04-25 DIAGNOSIS — Z79899 Other long term (current) drug therapy: Secondary | ICD-10-CM | POA: Diagnosis not present

## 2016-04-25 MED ORDER — HYDROXYZINE HCL 25 MG PO TABS: 25.0000 mg | ORAL_TABLET | Freq: Every evening | ORAL | 1 refills | Status: DC | PRN

## 2016-04-25 MED ORDER — MIRTAZAPINE 45 MG PO TABS: 45.0000 mg | ORAL_TABLET | Freq: Every day | ORAL | 1 refills | Status: DC

## 2016-04-25 NOTE — Patient Instructions (Signed)
1. Increase mirtazapine 45 mg at night 2. Start hydroxyzine 25 mg at night as needed for anxiety, sleep 3. Return to clinic in one month 4. Continue to see Ms. Bynum for therapy

## 2016-04-25 NOTE — Progress Notes (Signed)
BH MD/PA/NP OP Progress Note  04/25/2016 4:22 PM [place].  MRN:  [id number]  Chief Complaint:  Chief Complaint    Depression; Follow-up     Subjective:  "I have lapses" HPI:  Patient presents for follow up appointment. He states that he has "lapses" and feels depressed at times. He complains about his care for hernia, and is concerned what might happen. At the same time, he states that he "doesn't care as I'm depressed." He takes a walk every day for 4 miles. He reports occasional insomnia. He reports low energy and anhedonia. He reports passive SI. He takes Ambien with limited effect.   Visit Diagnosis:    ICD-9-CM ICD-10-CM   1. Moderate episode of recurrent major depressive disorder ([place]) 296.32 F33.1     Past Psychiatric History:  Outpatient: not since 2014 [facility]states he has "manic" ), [place] in 12/9-12/15/2014 for depression with SI Previous suicide attempt: Patient has a history of suicidal attempt in 1995 when he overdosed on sleeping pills. Past trials of medication: Wellbutrin, Cymbalta, sertraline, Trazodone  Past Medical History:  Past Medical History:  Diagnosis Date  . Anxiety   . Arthritis   . Bipolar disorder ([place])   . CAD (coronary artery disease)    a. INF STEMI 07/04/10:  tx with thrombectomy + [facility] to [facility];  cath 07/04/10: dLM 10-20%, pLAD 40-50%, mLAD 20-30%, pRCA 30%, mRCA occluded and tx with PCI, EF 50% with inf HK. A Multilink  . COPD (chronic obstructive pulmonary disease) ([place])   . Depression   . Glucose intolerance (impaired glucose tolerance)    A1c 6.2 06/2010  . History of DVT (deep vein thrombosis)    traumatic, s/p coumadin tx.  Marland Kitchen HLD (hyperlipidemia)   . Hypertension    pt states is currently on no medications  . Myocardial infarction ([place])    around 2013  . Shortness of breath    walking distance or climbing stairs  . Tobacco abuse   . Urinary frequency     Past  Surgical History:  Procedure Laterality Date  . CARDIAC CATHETERIZATION    . COLONOSCOPY N/A 12/29/2015   Procedure: COLONOSCOPY;  Surgeon: Rogene Houston, MD;  [facility];  Service: Endoscopy;  Laterality: N/A;  12:55  . ELECTROCARDIOGRAM     Showed inferolateral ST-elevation and a code STEMI was activated. In the ER, he was treated with morphine, herarin, and 600 mg of Plavix. He was transferred emergently to [facility].   . INGUINAL HERNIA REPAIR Left 07/26/2014   Procedure: OPEN REPAIR OF RECURENT LEFT INGUINAL HERNIA REPAIR WITH MESH;  Surgeon: Greer Pickerel, MD;  Location: [facility] ORS;  Service: General;  Laterality: Left;  . INSERTION OF MESH Bilateral 10/30/2013   Procedure: INSERTION OF MESH;  Surgeon: Gayland Curry, MD;  Location: [facility];  Service: General;  Laterality: Bilateral;  . LAPAROSCOPIC INGUINAL HERNIA WITH UMBILICAL HERNIA Bilateral 10/30/2013   Procedure: laparoscopic repair left pantaloom hernia with mesh, laparoscopic right inguinal hernia with mesh, OPEN REPAIR OF UMBILICAL HERNIA ;  Surgeon: Gayland Curry, MD;  Location: [facility];  Service: General;  Laterality: Bilateral;  . LAPAROSCOPY N/A 07/26/2014   Procedure: LAPAROSCOPY DIAGNOSTIC;  Surgeon: Greer Pickerel, MD;  Location: [facility];  Service: General;  Laterality: N/A;  . POLYPECTOMY  12/29/2015   Procedure: POLYPECTOMY;  Surgeon: Rogene Houston, MD;  [facility];  Service: Endoscopy;;  ascending colon, descending colon  . stent placement    . TOTAL HIP ARTHROPLASTY Right 05/10/2015   Procedure: RIGHT TOTAL HIP ARTHROPLASTY ANTERIOR APPROACH;  Surgeon: Paralee Cancel, MD;  Location: [facility];  Service: Orthopedics;  Laterality: Right;  Marland Kitchen VASECTOMY      Family Psychiatric History: Denies (Details unknown),   Family History:  Family History  Problem Relation Age of Onset  . Coronary artery disease Father   . Depression Father   . Depression Mother     Social History:  Social History    Social History  . Marital status: Divorced    Spouse name: N/A  . Number of children: N/A  . Years of education: N/A   Social History Main Topics  . Smoking status: Current Every Day Smoker    Packs/day: 2.00    Years: 45.00    Types: Cigarettes  . Smokeless tobacco: Never Used  . Alcohol use No     Comment: past hx of ETOH use was in inpt facility per court order  - 20 years, 10-18-2015 per pt not anymore,per pt stopped about 15-20 yrs ago  . Drug use: No     Comment: snorted heroin and cocaine, 10-18-2015 per pt in the past he did Marijuana only and nothing else  . Sexual activity: Not Asked   Other Topics Concern  . None   Social History Narrative   Lives in [place]. He lives alone. He has kids but is not married.     Allergies:  Allergies  Allergen Reactions  . Flomax [Tamsulosin Hcl] Other (See Comments)    This medication makes patient feel sick  . Penicillins Other (See Comments)    Reaction unknown occurred during childhood Has patient had a PCN reaction causing immediate rash, facial/tongue/throat swelling, SOB or lightheadedness with hypotension: unsure - childhood reaction Has patient had a PCN reaction causing severe rash involving mucus membranes or skin necrosis: unsure - childhood reaction Has patient had a PCN reaction that required hospitalization no Has patient had a PCN reaction occurring within the last 10 years: no If all of the above answers are "NO", then may p    Metabolic Disorder Labs: Lab Results  Component Value Date   HGBA1C 6.2 (H) 07/05/2010   MPG 131 (H) 07/05/2010   No results found for: PROLACTIN Lab Results  Component Value Date   CHOL 204 (H) 03/27/2016   TRIG 206 (H) 03/27/2016   HDL 35 (L) 03/27/2016   CHOLHDL 5.8 (H) 03/27/2016   VLDL 65 (H) 01/03/2016   LDLCALC 128 (H) 03/27/2016   LDLCALC 89 01/03/2016     Current Medications: Current Outpatient Prescriptions  Medication Sig Dispense Refill  . 5-Hydroxytryptophan  (5-HTP PO) Take 1 tablet by mouth at bedtime.    Marland Kitchen aspirin EC 81 MG tablet Take 1 tablet (81 mg total) by mouth daily. 90 tablet 3  . atorvastatin (LIPITOR) 40 MG tablet Take 1 tablet (40 mg total) by mouth daily. 30 tablet 11  . Cyanocobalamin (VITAMIN B-12 PO) Take 1,000 mg by mouth daily as needed.    Marland Kitchen glucosamine-chondroitin 500-400 MG tablet Take 1 tablet by mouth as needed (joint pain).     . MELATONIN PO Take 2 tablets by mouth at bedtime as needed (sleep).    . metoprolol tartrate (LOPRESSOR) 25 MG tablet Take 0.5 tablets (12.5 mg total) by mouth 2 (two) times daily. 30 tablet 11  . mirtazapine (REMERON) 30 MG tablet Take 1 tablet (30 mg total) by  mouth at bedtime. 30 tablet 1  . multivitamin-lutein (OCUVITE-LUTEIN) CAPS capsule Take 1 capsule by mouth every other day.    . naproxen sodium (ANAPROX) 220 MG tablet Take 220 mg by mouth 2 (two) times daily as needed (pain).     . Probiotic Product (PROBIOTIC-10 PO) Take by mouth.    . psyllium (METAMUCIL) 58.6 % powder Take 1 packet by mouth daily as needed (constipation).    . sennosides-docusate sodium (SENOKOT-S) 8.6-50 MG tablet Take 1 tablet by mouth daily as needed for constipation.    . sodium chloride (OCEAN) 0.65 % SOLN nasal spray Place 1 spray into both nostrils as needed for congestion.    Marland Kitchen tetrahydrozoline 0.05 % ophthalmic solution Place 2-3 drops into both eyes 4 (four) times daily as needed (For eye redness.).    Marland Kitchen traMADol (ULTRAM) 50 MG tablet Take 1 tablet (50 mg total) by mouth every 12 (twelve) hours as needed for moderate pain or severe pain. 30 tablet 0  . VALERIAN ROOT PO Take 1 capsule by mouth daily as needed (sleep).    . zolpidem (AMBIEN) 10 MG tablet Take 1 tablet (10 mg total) by mouth at bedtime as needed for sleep. 30 tablet 2   Current Facility-Administered Medications  Medication Dose Route Frequency Provider Last Rate Last Dose  . testosterone cypionate (DEPOTESTOSTERONE CYPIONATE) injection 200 mg  200  mg Intramuscular Q28 days Wardell Honour, MD   200 mg at 10/25/15 1556    Neurologic: Headache: No Seizure: No Paresthesias: No  Musculoskeletal: Strength & Muscle Tone: within normal limits Gait & Station: normal Patient leans: N/A  Psychiatric Specialty Exam: Review of Systems  Gastrointestinal: Negative for blood in stool.  Neurological: Positive for weakness.  Psychiatric/Behavioral: Positive for depression and suicidal ideas. Negative for hallucinations and substance abuse. The patient is nervous/anxious and has insomnia.   All other systems reviewed and are negative.   Blood pressure 111/74, pulse 78, height 5\' 9"  (1.753 m), weight 206 lb (93.4 kg).Body mass index is 30.42 kg/m.  General Appearance: Casual  Eye Contact:  Good  Speech:  Clear and Coherent  Volume:  Normal  Mood:  Depressed  Affect:  down-slightly worse  Thought Process:  Coherent and Goal Directed  Orientation:  Full (Time, Place, and Person)  Thought Content: Logical  Perceptions: denies AH/VH  Suicidal Thoughts:  No  Homicidal Thoughts:  No  Memory:  Immediate;   Good Recent;   Good Remote;   Good  Judgement:  Fair  Insight:  Fair  Psychomotor Activity:  Normal  Concentration:  Concentration: Good and Attention Span: Good  Recall:  Good  Fund of Knowledge: Good  Language: Good  Akathisia:  NA  Handed:  Right  AIMS (if indicated):  N/A  Assets:  Communication Skills Desire for Improvement  ADL's:  Intact  Cognition: WNL  Sleep:  fair   Assessment 66yo male with depression, CAD, HLD, tobacco abuse, COPD, DVT 2008, S/P right THA, AA, s/p bilateral inguinal hernia repair, who presented to the clinic for follow up appointment for depression. Psychosocial stressors including divorce and financial strain.   # MDD  R/o MDD with mixed episode Although he had responded very well to mirtazapine, there has been worsening in his neurovegetative symptoms without specific trigger. Will uptitrate  mirtazapine to optimize its effect. He is planning to be off Ambien, prescribed by his PCP. Will have hydroxyzine prn for anxiety/insomnia. He will continue to see Ms. Bynum for therapy. Noted that although  he had periods of hypomanic episode post operation, it is likely related to anesthetic use;  will closely monitor any manic symptoms.   Plan 1. Increase mirtazapine 45 mg at night 2. Start hydroxyzine 25 mg at night as needed for anxiety, sleep 3. Return to clinic in one month 4. Continue to see Ms. Bynum for therapy (He is prescribed Zolpidem 10 mg at night by his PCP)  The patient demonstrates the following  risk factors for suicide: Chronic risk factors for suicide include psychiatric disorder,  medical illness, previous suicide attempt, demographic factors (male, >67 yo). Acute risk factors for suicide include family or marital conflict, unemployment, social withdrawal/isolation, medical problems.  Protective factors for this patient include positive social support, hope for the future, being amenable to [facility] treatment, seeking for help.  Considering these factors, the overall suicide risk at this point appears to be low. Emergency resources including 911, ED, suicide hotline was discussed.   Treatment Plan Summary: Medication management  Norman Clay, MD 04/25/2016, 4:22 PM

## 2016-05-07 ENCOUNTER — Ambulatory Visit (INDEPENDENT_AMBULATORY_CARE_PROVIDER_SITE_OTHER): Payer: Medicare Other | Admitting: Psychiatry

## 2016-05-07 DIAGNOSIS — F331 Major depressive disorder, recurrent, moderate: Secondary | ICD-10-CM | POA: Diagnosis not present

## 2016-05-07 NOTE — Progress Notes (Addendum)
Patient:  Anthony [name].   DOB:     [birth year]  MR Number: [id number]  Location: [facility]:  [address] [place],  [place], [address]    Start: Monday 05/07/2016 1:10 PM End Monday 05/07/2016 1:59 PM           Provider/Observer:     Maurice Small, MSW, LCSW   Chief Complaint:      No chief complaint on file.   Reason For Service:     Anthony [name]. is a 66 y.o. male who says I am here because Dr. Modesta Messing referred me. I have depression. I think about suicide. I wouldn't do anything to myself because of my family. I am stressed with going to the doctors. I have been very weak for the past 1 1/2 years and I am falling behind in doing things.  Interventions Strategy:  Supportive/CBT  Participation Level:   Active  Participation Quality:  Appropriate      Behavioral Observation:  casual, Alert, and depressed  Current Psychosocial Factors: Health, family issues  Content of Session:    reviewed symptoms, praised and reinforced patient's increased involvement in activity, discussed effects on mood, discussed boundary issues in relationship with ex-wife, facilitated expression of feelings regarding upcoming move, assisted patient identify benefits of moving, reviewed  ways to improve sleep hygiene,    Current Status:    less depressed mood, worry,increased interest in activities, poor concentration, indecisiveness, insomnia.  Suicidal/Homicidal:   No   Patient Progress:   Good.  Patient reports increased involvement in activity and less depressed mood. His ex-wife is in the process of selling her home and property. Patient resides on the property and has been helping ex-wife do house repairs. He reports it has been stressful working with her but being busy has helped. He hopes he and she will have less contact once she has closed on the house. Patient plans to either live with his daughter or live in a trailer on her property. He expresses mixed feeling about moving. He  reports recent medication change but continued sleep difficulty.     Target Goals:   1. Learn and implement behavioral strategies to overcome depression.    2. Increase social involvement through learning and implementing interpersonal skills.  Last Reviewed:   02/01/2016  Goals Addressed Today:    1, 2  Plan:   Return in 4 weeks. Patient agrees to call for earlier appointment if needed.   Impression/Diagnosis:   Patient presents with symptoms of depression that have been present most of his life per his report. Patient reports one psychiatric hosptialization which occurred at age 42 due to manic episode. He also participated in outpatient therapy for about a year when he was around 68 or 14. Patient also participated in outpatient therapy at [facility] intermittently for several years. Current symptoms include depressed mood, worry, fatigue, poor motivation, diminished interest/pleasure in activities.    Diagnosis:  Axis I: MDD, Recurrent          Axis II: Deferred    Anthony Carsey, LCSW 05/07/2016

## 2016-05-17 ENCOUNTER — Ambulatory Visit: Payer: Medicare Other | Admitting: Family

## 2016-05-21 ENCOUNTER — Encounter: Payer: Self-pay | Admitting: Family

## 2016-05-21 ENCOUNTER — Ambulatory Visit (INDEPENDENT_AMBULATORY_CARE_PROVIDER_SITE_OTHER): Payer: Medicare Other

## 2016-05-21 ENCOUNTER — Ambulatory Visit (HOSPITAL_COMMUNITY): Payer: Self-pay | Admitting: Psychiatry

## 2016-05-21 ENCOUNTER — Ambulatory Visit (INDEPENDENT_AMBULATORY_CARE_PROVIDER_SITE_OTHER): Payer: Medicare Other | Admitting: Family

## 2016-05-21 VITALS — BP 113/74 | HR 98 | Temp 97.0°F | Ht 69.0 in | Wt 206.4 lb

## 2016-05-21 DIAGNOSIS — R911 Solitary pulmonary nodule: Secondary | ICD-10-CM

## 2016-05-21 DIAGNOSIS — R5383 Other fatigue: Secondary | ICD-10-CM

## 2016-05-21 DIAGNOSIS — J439 Emphysema, unspecified: Secondary | ICD-10-CM

## 2016-05-21 DIAGNOSIS — F17200 Nicotine dependence, unspecified, uncomplicated: Secondary | ICD-10-CM

## 2016-05-21 DIAGNOSIS — K4091 Unilateral inguinal hernia, without obstruction or gangrene, recurrent: Secondary | ICD-10-CM | POA: Diagnosis not present

## 2016-05-21 DIAGNOSIS — E119 Type 2 diabetes mellitus without complications: Secondary | ICD-10-CM | POA: Diagnosis not present

## 2016-05-21 DIAGNOSIS — I2510 Atherosclerotic heart disease of native coronary artery without angina pectoris: Secondary | ICD-10-CM

## 2016-05-21 DIAGNOSIS — E669 Obesity, unspecified: Secondary | ICD-10-CM

## 2016-05-21 DIAGNOSIS — F333 Major depressive disorder, recurrent, severe with psychotic symptoms: Secondary | ICD-10-CM | POA: Diagnosis not present

## 2016-05-21 DIAGNOSIS — K429 Umbilical hernia without obstruction or gangrene: Secondary | ICD-10-CM | POA: Diagnosis not present

## 2016-05-21 DIAGNOSIS — R918 Other nonspecific abnormal finding of lung field: Secondary | ICD-10-CM | POA: Diagnosis not present

## 2016-05-21 NOTE — Progress Notes (Signed)
Subjective:    Patient ID: Anthony Form., male    DOB: 1950-06-05, 66 y.o.   MRN: [id number]  HPI Pt presents to the office today for fatigue that started months ago that is unchanged. PT states he eats lots of vegetables and can not lose weight.   PT is followed by [facility] every two weeks for depression and insomnia. PT states he wakes up 2 times a night and is not sleeping well.   PT does have COPD and does currently smoke 1 1/2 packs a day . PT denies any SOB or cough at this time. Pt has hx of pulmonary nodules.   PT has CAD and is followed by Cardiologists every 6 months.   PT states he had bilateral inguinal repair in 3 year then it recurred after 6-7 months and had another repair. Pt states he has started having left testicular swelling since then, but feels like it is worse now.PT states he has intermittent tenderness 1 out 10.   Review of Systems  Constitutional: Positive for fatigue.  Respiratory: Positive for shortness of breath.   All other systems reviewed and are negative.      Objective:   Physical Exam  Constitutional: He is oriented to person, place, and time. He appears well-developed and well-nourished. No distress.  HENT:  Head: Normocephalic.  Right Ear: External ear normal.  Left Ear: External ear normal.  Nose: Nose normal.  Mouth/Throat: Oropharynx is clear and moist.  Eyes: Pupils are equal, round, and reactive to light. Right eye exhibits no discharge. Left eye exhibits no discharge.  Neck: Normal range of motion. Neck supple. No thyromegaly present.  Cardiovascular: Normal rate, regular rhythm, normal heart sounds and intact distal pulses.   No murmur heard. Pulmonary/Chest: Effort normal and breath sounds normal. No respiratory distress. He has no wheezes.  Abdominal: Soft. Bowel sounds are normal. He exhibits no distension. There is no tenderness. A hernia is present. Hernia confirmed positive in the left inguinal area.    Genitourinary: Left testis shows swelling and tenderness.  Musculoskeletal: Normal range of motion. He exhibits no edema or tenderness.  Neurological: He is alert and oriented to person, place, and time.  Skin: Skin is warm and dry. No rash noted. No erythema.  Psychiatric: He has a normal mood and affect. His behavior is normal. Judgment and thought content normal.  Vitals reviewed.     BP 113/74   Pulse 98   Temp 97 F (36.1 C) (Oral)   Ht 5' 9"  (1.753 m)   Wt 206 lb 6.4 oz (93.6 kg)   BMI 30.48 kg/m      Assessment & Plan:  1. Other fatigue - CMP14+EGFR - CBC with Differential/Platelet - DG Chest 2 View; Future  2. Coronary artery disease involving native coronary artery of native heart without angina pectoris - CMP14+EGFR - CBC with Differential/Platelet - DG Chest 2 View; Future  3. Obesity (BMI 30-39.9) - CMP14+EGFR - CBC with Differential/Platelet  4. Major psychotic depression, recurrent ([place]) - CMP14+EGFR - CBC with Differential/Platelet  5. Pulmonary nodules - CMP14+EGFR - DG Chest 2 View; Future  6. Current smoker  7. Umbilical hernia without obstruction and without gangrene  - Ambulatory referral to General Surgery  8. Pulmonary emphysema, unspecified emphysema type ([place])  9. Nodule of left lung  10. Recurrent left inguinal hernia - Ambulatory referral to General Surgery   Continue all meds Labs pending Health Maintenance reviewed Diet and exercise encouraged RTO as needed  and keep chronic follow up appt   [name], FNP

## 2016-05-21 NOTE — Patient Instructions (Signed)

## 2016-05-22 LAB — CMP14+EGFR
A/G RATIO: 1.8 (ref 1.2–2.2)
ALBUMIN: 4.7 g/dL (ref 3.6–4.8)
ALT: 34 IU/L (ref 0–44)
AST: 36 IU/L (ref 0–40)
Alkaline Phosphatase: 60 IU/L (ref 39–117)
BILIRUBIN TOTAL: 0.9 mg/dL (ref 0.0–1.2)
BUN/Creatinine Ratio: 17 (ref 10–24)
BUN: 16 mg/dL (ref 8–27)
CO2: 20 mmol/L (ref 18–29)
CREATININE: 0.94 mg/dL (ref 0.76–1.27)
Calcium: 9.8 mg/dL (ref 8.6–10.2)
Chloride: 96 mmol/L (ref 96–106)
GFR calc non Af Amer: 84 mL/min/{1.73_m2} (ref >59)
GFR, EST AFRICAN AMERICAN: 97 mL/min/{1.73_m2} (ref >59)
GLUCOSE: 152 mg/dL — AB (ref 65–99)
Globulin, Total: 2.6 g/dL (ref 1.5–4.5)
Potassium: 4.2 mmol/L (ref 3.5–5.2)
Sodium: 137 mmol/L (ref 134–144)
Total Protein: 7.3 g/dL (ref 6.0–8.5)

## 2016-05-22 LAB — CBC WITH DIFFERENTIAL/PLATELET
BASOS: 0 %
Basophils Absolute: 0 10*3/uL (ref 0.0–0.2)
EOS (ABSOLUTE): 0 10*3/uL (ref 0.0–0.4)
EOS: 0 %
HEMATOCRIT: 47 % (ref 37.5–51.0)
HEMOGLOBIN: 16.8 g/dL (ref 13.0–17.7)
IMMATURE GRANS (ABS): 0.1 10*3/uL (ref 0.0–0.1)
IMMATURE GRANULOCYTES: 1 %
LYMPHS: 16 %
Lymphocytes Absolute: 1.7 10*3/uL (ref 0.7–3.1)
MCH: 31.2 pg (ref 26.6–33.0)
MCHC: 35.7 g/dL (ref 31.5–35.7)
MCV: 87 fL (ref 79–97)
Monocytes Absolute: 0.9 10*3/uL (ref 0.1–0.9)
Monocytes: 9 %
NEUTROS ABS: 7.7 10*3/uL — AB (ref 1.4–7.0)
NEUTROS PCT: 74 %
Platelets: 221 10*3/uL (ref 150–379)
RBC: 5.38 x10E6/uL (ref 4.14–5.80)
RDW: 13 % (ref 12.3–15.4)
WBC: 10.4 10*3/uL (ref 3.4–10.8)

## 2016-05-24 LAB — HGB A1C W/O EAG: HEMOGLOBIN A1C: 7.4 % — AB (ref 4.8–5.6)

## 2016-05-24 LAB — SPECIMEN STATUS REPORT

## 2016-05-25 ENCOUNTER — Other Ambulatory Visit: Payer: Self-pay | Admitting: Family

## 2016-05-25 DIAGNOSIS — E119 Type 2 diabetes mellitus without complications: Secondary | ICD-10-CM | POA: Insufficient documentation

## 2016-05-25 MED ORDER — METFORMIN HCL ER 500 MG PO TB24: 500.0000 mg | ORAL_TABLET | Freq: Every day | ORAL | 2 refills | Status: DC

## 2016-05-28 ENCOUNTER — Telehealth: Payer: Self-pay | Admitting: Family

## 2016-05-28 NOTE — Telephone Encounter (Signed)
Left message for patient to call . He can get another referral to [facility] or any place he chooses. He should let us know where he prefers.

## 2016-05-31 ENCOUNTER — Other Ambulatory Visit: Payer: Self-pay | Admitting: *Deleted

## 2016-05-31 DIAGNOSIS — Z9889 Other specified postprocedural states: Principal | ICD-10-CM

## 2016-05-31 DIAGNOSIS — Z8719 Personal history of other diseases of the digestive system: Secondary | ICD-10-CM

## 2016-06-05 ENCOUNTER — Ambulatory Visit (INDEPENDENT_AMBULATORY_CARE_PROVIDER_SITE_OTHER): Payer: Medicare Other | Admitting: Family

## 2016-06-05 ENCOUNTER — Encounter: Payer: Self-pay | Admitting: Family

## 2016-06-05 VITALS — BP 121/80 | HR 78 | Temp 97.1°F | Ht 69.0 in | Wt 198.8 lb

## 2016-06-05 DIAGNOSIS — G8929 Other chronic pain: Secondary | ICD-10-CM | POA: Diagnosis not present

## 2016-06-05 DIAGNOSIS — I2510 Atherosclerotic heart disease of native coronary artery without angina pectoris: Secondary | ICD-10-CM | POA: Diagnosis not present

## 2016-06-05 DIAGNOSIS — Z0289 Encounter for other administrative examinations: Secondary | ICD-10-CM

## 2016-06-05 DIAGNOSIS — M25551 Pain in right hip: Secondary | ICD-10-CM

## 2016-06-05 DIAGNOSIS — F1120 Opioid dependence, uncomplicated: Secondary | ICD-10-CM

## 2016-06-05 DIAGNOSIS — K4021 Bilateral inguinal hernia, without obstruction or gangrene, recurrent: Secondary | ICD-10-CM

## 2016-06-05 MED ORDER — TRAMADOL HCL 50 MG PO TABS: 50.0000 mg | ORAL_TABLET | Freq: Two times a day (BID) | ORAL | 2 refills | Status: DC | PRN

## 2016-06-05 NOTE — Patient Instructions (Signed)

## 2016-06-05 NOTE — Progress Notes (Signed)
   Subjective:    Patient ID: Anthony Form., male    DOB: [birth year], 66 y.o.   MRN: [id number]  HPI PT presents to the office today for recurrent hernia with enlarged testes. PT states he has had this hernia repaired twice. Pt states he saw Dr. Arnoldo Morale in 03/22/16 and was told he could wait. Pt states he is upset and does not wait to see Dr. Arnoldo Morale again because he feels like "he did not want to do surgery on me". Pt reports that his daughter works at [employer] and will recommend a Urologists.   PT states he Ultram 50 mg every 12 hours as needed for bilateral hip pain and arthritis. PT states he may take one every other day. PT states he has intermittent pain of 8 out 10 in his hips and knees. Tylenol, ice, and Ultram helps.      Review of Systems  Genitourinary: Positive for scrotal swelling and testicular pain.  Musculoskeletal: Positive for arthralgias and back pain.  All other systems reviewed and are negative.      Objective:   Physical Exam  Constitutional: He is oriented to person, place, and time. He appears well-developed and well-nourished. No distress.  HENT:  Head: Normocephalic.  Neck: No thyromegaly present.  Cardiovascular: Normal rate, regular rhythm, normal heart sounds and intact distal pulses.   No murmur heard. Pulmonary/Chest: Effort normal and breath sounds normal. No respiratory distress. He has no wheezes.  Abdominal: Soft. Bowel sounds are normal. He exhibits no distension. There is no tenderness.  Genitourinary: Right testis shows swelling. Left testis shows swelling.  Musculoskeletal: Normal range of motion. He exhibits no edema or tenderness.  Neurological: He is alert and oriented to person, place, and time.  Skin: Skin is warm and dry. No rash noted. No erythema.  Psychiatric: He has a normal mood and affect. His behavior is normal. Judgment and thought content normal.  Vitals reviewed.     BP 121/80   Pulse 78   Temp 97.1 F (36.2 C) (Oral)    Ht 5\' 9"  (1.753 m)   Wt 198 lb 12.8 oz (90.2 kg)   BMI 29.36 kg/m      Assessment & Plan:  1. Chronic right hip pain - traMADol (ULTRAM) 50 MG tablet; Take 1 tablet (50 mg total) by mouth every 12 (twelve) hours as needed for moderate pain or severe pain.  Dispense: 30 tablet; Refill: 2 - ToxASSURE Select 13 (MW), Urine  2. Bilateral recurrent inguinal hernia without obstruction or gangrene - traMADol (ULTRAM) 50 MG tablet; Take 1 tablet (50 mg total) by mouth every 12 (twelve) hours as needed for moderate pain or severe pain.  Dispense: 30 tablet; Refill: 2 - ToxASSURE Select 13 (MW), Urine  3. Pain medication agreement signed - traMADol (ULTRAM) 50 MG tablet; Take 1 tablet (50 mg total) by mouth every 12 (twelve) hours as needed for moderate pain or severe pain.  Dispense: 30 tablet; Refill: 2 - ToxASSURE Select 13 (MW), Urine  4. Uncomplicated opioid dependence (HCC) - traMADol (ULTRAM) 50 MG tablet; Take 1 tablet (50 mg total) by mouth every 12 (twelve) hours as needed for moderate pain or severe pain.  Dispense: 30 tablet; Refill: 2 - ToxASSURE Select 13 (MW), Urine  Referral pending for [facility] Surgery for hernia repair Discussed we will call or [facility] office will call Pain contracted signed today Ultram given RTO in 3 months   [name], FNP

## 2016-06-07 ENCOUNTER — Ambulatory Visit (HOSPITAL_COMMUNITY): Payer: Medicare Other | Admitting: Psychiatry

## 2016-06-07 LAB — TOXASSURE SELECT 13 (MW), URINE

## 2016-06-12 ENCOUNTER — Other Ambulatory Visit: Payer: Self-pay

## 2016-06-15 ENCOUNTER — Ambulatory Visit (INDEPENDENT_AMBULATORY_CARE_PROVIDER_SITE_OTHER): Payer: Medicare Other | Admitting: Physician Assistant

## 2016-06-15 ENCOUNTER — Encounter: Payer: Self-pay | Admitting: Physician Assistant

## 2016-06-15 VITALS — BP 115/75 | HR 73 | Temp 97.8°F

## 2016-06-15 DIAGNOSIS — S61201A Unspecified open wound of left index finger without damage to nail, initial encounter: Secondary | ICD-10-CM | POA: Diagnosis not present

## 2016-06-15 DIAGNOSIS — I2510 Atherosclerotic heart disease of native coronary artery without angina pectoris: Secondary | ICD-10-CM

## 2016-06-15 DIAGNOSIS — S61203A Unspecified open wound of left middle finger without damage to nail, initial encounter: Secondary | ICD-10-CM

## 2016-06-15 DIAGNOSIS — S61209A Unspecified open wound of unspecified finger without damage to nail, initial encounter: Secondary | ICD-10-CM

## 2016-06-15 DIAGNOSIS — S61205A Unspecified open wound of left ring finger without damage to nail, initial encounter: Secondary | ICD-10-CM | POA: Diagnosis not present

## 2016-06-15 NOTE — Progress Notes (Signed)
Subjective:     Patient ID: Anthony Form., male   DOB: [birth year], 66 y.o.   MRN: [id number]  HPI Pt with laceration to multiple fingers of the L hand He cut this hand with circular saw Pt immed placed pressure and here for review He believes his tetanus is up to date Denies any numbness to the finger  Review of Systems + pain but denies any numbness of loss of motion    Objective:   Physical Exam Pt with multiple avulsion type lacerations to the 2nd, 3rd, and 5th digit of the L hand The 2nd digit has 2 horizontal lacerations to the distal palmar aspect ~ 1.5cm each 3rd digit with 1.5 cm laceration to the mid palmar aspect ~ 2 cm 5th digiti with elliptical laceration to the distal tip ~ 2cm All wounds with some avulsion aspect FROM of all finger No communication with bone or tendon Good cap rf and sensory Offered closure today Consent obtained Areas cleansed with Betadine Anesth with 3 cc lido w/o epi  Under sterile technique wounds closed with 5-0 nylon Due to avulsion edges approx 2 simple sutures placed to both wounds of 2nd digiti 2 simple sutures place to 3rd digiti 5 sutures placed to the 5th digit All areas then cleansed and dressed     Assessment:     Open wound of fingers, complicated, initial encounter      Plan:     S/S of infection reviewed Wound care reviewed Keep wound clean and dry Tetanus confirmed up to date Return in 10 days for suture removal

## 2016-06-15 NOTE — Patient Instructions (Signed)

## 2016-06-20 ENCOUNTER — Ambulatory Visit (INDEPENDENT_AMBULATORY_CARE_PROVIDER_SITE_OTHER): Payer: Medicare Other | Admitting: *Deleted

## 2016-06-20 VITALS — BP 126/79 | HR 70 | Ht 68.0 in | Wt 200.0 lb

## 2016-06-20 DIAGNOSIS — E119 Type 2 diabetes mellitus without complications: Secondary | ICD-10-CM | POA: Diagnosis not present

## 2016-06-20 DIAGNOSIS — Z0000 Encounter for general adult medical examination without abnormal findings: Secondary | ICD-10-CM

## 2016-06-20 NOTE — Patient Instructions (Addendum)
Please keep appointment with Osa Craver, PA on June 19th to have stitches removed at 1:10 pm  Your appointment with Cherre Robins, PharmD is June 21st at 3:30 pm  You have been scheduled for an appointment with [facility] on July 23rd at 1:20 pm - their office is located in front of the old [address] building in [place].  The phone number there is [phone].    Please try to get 30 minutes of exercise at least 3 days per week   Thank you for seeing [place] for your Annual Wellness Visit today!   Preventive Care 47 Years and Older, Male Preventive care refers to lifestyle choices and visits with your health care provider that can promote health and wellness. What does preventive care include?  A yearly physical exam. This is also called an annual well check.  Dental exams once or twice a year.  Routine eye exams. Ask your health care provider how often you should have your eyes checked.  Personal lifestyle choices, including: ? Daily care of your teeth and gums. ? Regular physical activity. ? Eating a healthy diet. ? Avoiding tobacco and drug use. ? Limiting alcohol use. ? Practicing safe sex. ? Taking low doses of aspirin every day. ? Taking vitamin and mineral supplements as recommended by your health care provider. What happens during an annual well check? The services and screenings done by your health care provider during your annual well check will depend on your age, overall health, lifestyle risk factors, and family history of disease. Counseling Your health care provider may ask you questions about your:  Alcohol use.  Tobacco use.  Drug use.  Emotional well-being.  Home and relationship well-being.  Sexual activity.  Eating habits.  History of falls.  Memory and ability to understand (cognition).  Work and work Statistician.  Screening You may have the following tests or measurements:  Height, weight, and BMI.  Blood pressure.  Lipid and  cholesterol levels. These may be checked every 5 years, or more frequently if you are over 69 years old.  Skin check.  Lung cancer screening. You may have this screening every year starting at age 74 if you have a 30-pack-year history of smoking and currently smoke or have quit within the past 15 years.  Fecal occult blood test (FOBT) of the stool. You may have this test every year starting at age 36.  Flexible sigmoidoscopy or colonoscopy. You may have a sigmoidoscopy every 5 years or a colonoscopy every 10 years starting at age 33.  Prostate cancer screening. Recommendations will vary depending on your family history and other risks.  Hepatitis C blood test.  Hepatitis B blood test.  Sexually transmitted disease (STD) testing.  Diabetes screening. This is done by checking your blood sugar (glucose) after you have not eaten for a while (fasting). You may have this done every 1-3 years.  Abdominal aortic aneurysm (AAA) screening. You may need this if you are a current or former smoker.  Osteoporosis. You may be screened starting at age 25 if you are at high risk.  Talk with your health care provider about your test results, treatment options, and if necessary, the need for more tests. Vaccines Your health care provider may recommend certain vaccines, such as:  Influenza vaccine. This is recommended every year.  Tetanus, diphtheria, and acellular pertussis (Tdap, Td) vaccine. You may need a Td booster every 10 years.  Varicella vaccine. You may need this if you have not been vaccinated.  Zoster vaccine. You may need this after age 44.  Measles, mumps, and rubella (MMR) vaccine. You may need at least one dose of MMR if you were born in 1957 or later. You may also need a second dose.  Pneumococcal 13-valent conjugate (PCV13) vaccine. One dose is recommended after age 8.  Pneumococcal polysaccharide (PPSV23) vaccine. One dose is recommended after age 79.  Meningococcal vaccine.  You may need this if you have certain conditions.  Hepatitis A vaccine. You may need this if you have certain conditions or if you travel or work in places where you may be exposed to hepatitis A.  Hepatitis B vaccine. You may need this if you have certain conditions or if you travel or work in places where you may be exposed to hepatitis B.  Haemophilus influenzae type b (Hib) vaccine. You may need this if you have certain risk factors.  Talk to your health care provider about which screenings and vaccines you need and how often you need them. This information is not intended to replace advice given to you by your health care provider. Make sure you discuss any questions you have with your health care provider. Document Released: 01/21/2015 Document Revised: 09/14/2015 Document Reviewed: 10/26/2014 Elsevier Interactive Patient Education  2017 [employer].

## 2016-06-20 NOTE — Progress Notes (Signed)
Subjective:   Anthony [name]. is a 66 y.o. male who presents for an Initial Medicare Annual Wellness Visit.  Patient retired from a job as a Actor.  He enjoys wood working, and is currently working on building his second tiny house.  He also enjoys walking trails for exercise.  Anthony [name] has 2 daughters and 5 grandchildren.  He currently lives with his daughter and her husband, and their three children.  He reports that he feels his health is better than it was last year.    Review of Systems  Patient complains of slight left knee pain and some minor discomfort to multiple fingers on left had related to lacerations that he was seen for here earlier in the week.   All other systems negative       Objective:    Today's Vitals   06/20/16 1552  BP: 126/79  Pulse: 70  Weight: 200 lb (90.7 kg)  Height: 5\' 8"  (1.727 m)  PainSc: 4   PainLoc: Knee   Body mass index is 30.41 kg/m.  Current Medications (verified) Outpatient Encounter Prescriptions as of 06/20/2016  Medication Sig  . aspirin EC 81 MG tablet Take 1 tablet (81 mg total) by mouth daily.  Marland Kitchen atorvastatin (LIPITOR) 40 MG tablet Take 1 tablet (40 mg total) by mouth daily.  . Cyanocobalamin (VITAMIN B-12 PO) Take 1,000 mg by mouth daily as needed.  Marland Kitchen glucosamine-chondroitin 500-400 MG tablet Take 1 tablet by mouth as needed (joint pain).   . hydrOXYzine (ATARAX/VISTARIL) 25 MG tablet Take 1 tablet (25 mg total) by mouth at bedtime as needed for anxiety.  Marland Kitchen MELATONIN PO Take 2 tablets by mouth at bedtime as needed (sleep).  . metFORMIN (GLUCOPHAGE XR) 500 MG 24 hr tablet Take 1 tablet (500 mg total) by mouth daily with breakfast.  . mirtazapine (REMERON) 45 MG tablet Take 1 tablet (45 mg total) by mouth at bedtime.  . naproxen sodium (ANAPROX) 220 MG tablet Take 220 mg by mouth 2 (two) times daily as needed (pain).   . Probiotic Product (PROBIOTIC-10 PO) Take by mouth.  . sennosides-docusate sodium (SENOKOT-S)  8.6-50 MG tablet Take 1 tablet by mouth daily as needed for constipation.  . sodium chloride (OCEAN) 0.65 % SOLN nasal spray Place 1 spray into both nostrils as needed for congestion.  Marland Kitchen tetrahydrozoline 0.05 % ophthalmic solution Place 2-3 drops into both eyes 4 (four) times daily as needed (For eye redness.).  Marland Kitchen traMADol (ULTRAM) 50 MG tablet Take 1 tablet (50 mg total) by mouth every 12 (twelve) hours as needed for moderate pain or severe pain.  Marland Kitchen VALERIAN ROOT PO Take 1 capsule by mouth daily as needed (sleep).  Marland Kitchen 5-Hydroxytryptophan (5-HTP PO) Take 1 tablet by mouth at bedtime.  . metoprolol tartrate (LOPRESSOR) 25 MG tablet Take 0.5 tablets (12.5 mg total) by mouth 2 (two) times daily.  . multivitamin-lutein (OCUVITE-LUTEIN) CAPS capsule Take 1 capsule by mouth every other day.  . psyllium (METAMUCIL) 58.6 % powder Take 1 packet by mouth daily as needed (constipation).  Marland Kitchen zolpidem (AMBIEN) 10 MG tablet Take 1 tablet (10 mg total) by mouth at bedtime as needed for sleep.   No facility-administered encounter medications on file as of 06/20/2016.     Allergies (verified) Flomax [tamsulosin hcl] and Penicillins   History: Past Medical History:  Diagnosis Date  . Anxiety   . Arthritis   . Bipolar disorder ([place])   . CAD (coronary artery disease)  a. INF STEMI 07/04/10:  tx with thrombectomy + [facility] to [facility];  cath 07/04/10: dLM 10-20%, pLAD 40-50%, mLAD 20-30%, pRCA 30%, mRCA occluded and tx with PCI, EF 50% with inf HK. A Multilink  . COPD (chronic obstructive pulmonary disease) ([place])   . Depression   . Glucose intolerance (impaired glucose tolerance)    A1c 6.2 06/2010  . History of DVT (deep vein thrombosis)    traumatic, s/p coumadin tx.  Marland Kitchen HLD (hyperlipidemia)   . Hypertension    pt states is currently on no medications  . Myocardial infarction ([place])    around 2013  . Shortness of breath    walking distance or climbing stairs  . Tobacco abuse   . Urinary frequency     Past Surgical History:  Procedure Laterality Date  . CARDIAC CATHETERIZATION    . COLONOSCOPY N/A 12/29/2015   Procedure: COLONOSCOPY;  Surgeon: Rogene Houston, MD;  Location: [facility];  Service: Endoscopy;  Laterality: N/A;  12:55  . ELECTROCARDIOGRAM     Showed inferolateral ST-elevation and a code STEMI was activated. In the ER, he was treated with morphine, herarin, and 600 mg of Plavix. He was transferred emergently to [facility].   . INGUINAL HERNIA REPAIR Left 07/26/2014   Procedure: OPEN REPAIR OF RECURENT LEFT INGUINAL HERNIA REPAIR WITH MESH;  Surgeon: Greer Pickerel, MD;  Location: [facility];  Service: General;  Laterality: Left;  . INSERTION OF MESH Bilateral 10/30/2013   Procedure: INSERTION OF MESH;  Surgeon: Gayland Curry, MD;  Location: [facility];  Service: General;  Laterality: Bilateral;  . LAPAROSCOPIC INGUINAL HERNIA WITH UMBILICAL HERNIA Bilateral 10/30/2013   Procedure: laparoscopic repair left pantaloom hernia with mesh, laparoscopic right inguinal hernia with mesh, OPEN REPAIR OF UMBILICAL HERNIA ;  Surgeon: Gayland Curry, MD;  Location: [facility];  Service: General;  Laterality: Bilateral;  . LAPAROSCOPY N/A 07/26/2014   Procedure: LAPAROSCOPY DIAGNOSTIC;  Surgeon: Greer Pickerel, MD;  Location: [facility];  Service: General;  Laterality: N/A;  . POLYPECTOMY  12/29/2015   Procedure: POLYPECTOMY;  Surgeon: Rogene Houston, MD;  [facility];  Service: Endoscopy;;  ascending colon, descending colon  . stent placement    . TOTAL HIP ARTHROPLASTY Right 05/10/2015   Procedure: RIGHT TOTAL HIP ARTHROPLASTY ANTERIOR APPROACH;  Surgeon: Paralee Cancel, MD;  Location: [facility];  Service: Orthopedics;  Laterality: Right;  Marland Kitchen VASECTOMY     Family History  Problem Relation Age of Onset  . Coronary artery disease Father   . Depression Father   . Depression Mother    Social History   Occupational History  . Not on file.   Social History Main Topics  . Smoking status:  Current Every Day Smoker    Packs/day: 1.50    Years: 45.00    Types: Cigarettes  . Smokeless tobacco: Never Used  . Alcohol use No     Comment: past hx of ETOH use was in inpt facility per court order  - 20 years, 10-18-2015 per pt not anymore,per pt stopped about 15-20 yrs ago  . Drug use: No     Comment: snorted heroin and cocaine, 10-18-2015 per pt in the past he did Marijuana only and nothing else  . Sexual activity: Not on file   Tobacco Counseling Ready to quit: No Counseling given: Yes   Activities of Daily Living In your present state of health, do you have any difficulty performing the following activities: 06/20/2016  Hearing? N  Vision? N  Difficulty concentrating or making decisions? Y  Walking or climbing stairs? N  Dressing or bathing? N  Doing errands, shopping? N  Some recent data might be hidden    Immunizations and Health Maintenance Immunization History  Administered Date(s) Administered  . Tdap 03/07/2012   Health Maintenance Due  Topic Date Due  . Hepatitis C Screening  Mar 06, 1950  . FOOT EXAM  10/11/1958  . OPHTHALMOLOGY EXAM  10/11/1958  . URINE MICROALBUMIN  10/11/1958  . PNA vac Low Risk Adult (1 of 2 - PCV13) 10/10/2013   Urine microalbumin collected today Eye exam scheduled  All other health maintenance recommended at next follow up with [name], FNP   Patient Care Team: Sharion Balloon, FNP as PCP - General (Family Medicine)      Assessment:   This is a routine wellness examination for Anthony [name].   Hearing/Vision screen No hearing deficits noted. Scheduled patient for a diabetic eye exam at [facility] in [place] on 07/30/16 at 1:20 pm.  Dietary issues and exercise activities discussed: Current Exercise Habits: Home exercise routine, Type of exercise: walking, Time (Minutes): 25, Frequency (Times/Week): 4, Weekly Exercise (Minutes/Week): 100, Intensity: Mild, Exercise limited by: orthopedic condition(s)  Goals    . Exercise  3x per week (30 min per time)      Depression Screen PHQ 2/9 Scores 06/20/2016 06/15/2016 06/05/2016 05/21/2016  PHQ - 2 Score 0 0 1 0  PHQ- 9 Score - - - -    Fall Risk Fall Risk  06/20/2016 05/21/2016 04/13/2016 10/25/2015 09/22/2015  Falls in the past year? No No No No No    Cognitive Function: MMSE - Mini Mental State Exam 06/20/2016  Orientation to time 5  Orientation to Place 5  Registration 3  Attention/ Calculation 5  Recall 3  Language- name 2 objects 2  Language- repeat 1  Language- follow 3 step command 3  Language- read & follow direction 1  Write a sentence 1  Copy design 1  Total score 30        Screening Tests Health Maintenance  Topic Date Due  . Hepatitis C Screening  1950/05/20  . FOOT EXAM  10/11/1958  . OPHTHALMOLOGY EXAM  10/11/1958  . URINE MICROALBUMIN  10/11/1958  . PNA vac Low Risk Adult (1 of 2 - PCV13) 10/10/2013  . INFLUENZA VACCINE  08/08/2016  . HEMOGLOBIN A1C  11/21/2016  . TETANUS/TDAP  03/07/2022  . COLONOSCOPY  12/28/2025        Plan:     Keep follow up appointment with Thomasene Mohair, PA on 06/26/16 1:10 pm Keep appointment with Cherre Robins, PharmD on 06/28/16 at 3:30 pm Keep diabetic eye exam appointment on 07/30/16 at 1:20 pm Exercise for 30 minutes at least 3 times per week  I have personally reviewed and noted the following in the patient's chart:   . Medical and social history . Use of alcohol, tobacco or illicit drugs  . Current medications and supplements . Functional ability and status . Nutritional status . Physical activity . Advanced directives . List of other physicians . Hospitalizations, surgeries, and ER visits in previous 12 months . Vitals . Screenings to include cognitive, depression, and falls . Referrals and appointments  In addition, I have reviewed and discussed with patient certain preventive protocols, quality metrics, and best practice recommendations. A written personalized care plan for preventive  services as well as general preventive health recommendations were provided to patient.  WYATT, AMY M, RN   06/20/2016    I have reviewed and agree with the above AWV documentation.   [name], FNP

## 2016-06-21 LAB — MICROALBUMIN / CREATININE URINE RATIO
CREATININE, UR: 13.6 mg/dL
Microalb/Creat Ratio: <22.1 mg/g creat (ref 0.0–30.0)

## 2016-06-26 ENCOUNTER — Encounter: Payer: Self-pay | Admitting: Physician Assistant

## 2016-06-26 ENCOUNTER — Ambulatory Visit (INDEPENDENT_AMBULATORY_CARE_PROVIDER_SITE_OTHER): Payer: Medicare Other | Admitting: Physician Assistant

## 2016-06-26 VITALS — BP 116/74 | HR 71 | Temp 97.7°F | Ht 68.0 in | Wt 199.6 lb

## 2016-06-26 DIAGNOSIS — I2510 Atherosclerotic heart disease of native coronary artery without angina pectoris: Secondary | ICD-10-CM | POA: Diagnosis not present

## 2016-06-26 DIAGNOSIS — S61209D Unspecified open wound of unspecified finger without damage to nail, subsequent encounter: Secondary | ICD-10-CM

## 2016-06-26 DIAGNOSIS — S61205D Unspecified open wound of left ring finger without damage to nail, subsequent encounter: Secondary | ICD-10-CM

## 2016-06-26 DIAGNOSIS — Z4802 Encounter for removal of sutures: Secondary | ICD-10-CM | POA: Diagnosis not present

## 2016-06-26 NOTE — Progress Notes (Signed)
Subjective:     Patient ID: Anthony Form., male   DOB: December 01, 1950, 66 y.o.   MRN: [id number]  HPI Pt here for suture removal of lacerations to the L hand/fingers No pain or numbness to the areas No pain or redness noted  Review of Systems Denies pain or drainage form the area    Objective:   Physical Exam Wounds healing well No surrounding erythema, edema, or induration FROM of all of the fingers Good sensory Sutures removed Wounds cleansed and dressed    Assessment:     * 1. Open wound of finger of left hand, subsequent encounter   2. Visit for suture removal    *    Plan:     Wound care reviewed No prolonged soaking x 1 more week If any S/S of infection return immed F/U prn

## 2016-06-26 NOTE — Patient Instructions (Signed)
Suture Removal, Care After Refer to this sheet in the next few weeks. These instructions provide you with information on caring for yourself after your procedure. Your health care provider may also give you more specific instructions. Your treatment has been planned according to current medical practices, but problems sometimes occur. Call your health care provider if you have any problems or questions after your procedure. What can I expect after the procedure? After your stitches (sutures) are removed, it is typical to have the following:  Some discomfort and swelling in the wound area.  Slight redness in the area.  Follow these instructions at home:  If you have skin adhesive strips over the wound area, do not take the strips off. They will fall off on their own in a few days. If the strips remain in place after 14 days, you may remove them.  Change any bandages (dressings) at least once a day or as directed by your health care provider. If the bandage sticks, soak it off with warm, soapy water.  Apply cream or ointment only as directed by your health care provider. If using cream or ointment, wash the area with soap and water 2 times a day to remove all the cream or ointment. Rinse off the soap and pat the area dry with a clean towel.  Keep the wound area dry and clean. If the bandage becomes wet or dirty, or if it develops a bad smell, change it as soon as possible.  Continue to protect the wound from injury.  Use sunscreen when out in the sun. New scars become sunburned easily. Contact a health care provider if:  You have increasing redness, swelling, or pain in the wound.  You see pus coming from the wound.  You have a fever.  You notice a bad smell coming from the wound or dressing.  Your wound breaks open (edges not staying together). This information is not intended to replace advice given to you by your health care provider. Make sure you discuss any questions you have  with your health care provider. Document Released: 09/19/2000 Document Revised: 06/02/2015 Document Reviewed: 08/06/2012 Elsevier Interactive Patient Education  2017 [employer]  

## 2016-06-28 ENCOUNTER — Ambulatory Visit: Payer: Self-pay | Admitting: Pharmacist

## 2016-06-29 ENCOUNTER — Telehealth: Payer: Self-pay | Admitting: Family

## 2016-06-29 ENCOUNTER — Encounter: Payer: Self-pay | Admitting: Family

## 2016-07-09 ENCOUNTER — Ambulatory Visit: Payer: Self-pay | Admitting: Surgery

## 2016-07-09 DIAGNOSIS — K4091 Unilateral inguinal hernia, without obstruction or gangrene, recurrent: Secondary | ICD-10-CM | POA: Diagnosis not present

## 2016-07-09 DIAGNOSIS — Z01818 Encounter for other preprocedural examination: Secondary | ICD-10-CM | POA: Diagnosis not present

## 2016-07-09 DIAGNOSIS — Z720 Tobacco use: Secondary | ICD-10-CM | POA: Diagnosis not present

## 2016-07-09 NOTE — H&P (Signed)
Anthony [name] 07/09/2016 1:57 PM Location: [facility] Patient #: [id number] DOB: February 28, 1950 Divorced / Language: Cleophus [name] / Race: White Male  History of Present Illness Adin Hector MD; 07/09/2016 2:47 PM) The patient is a 66 year old male who presents with an inguinal hernia. Note for "Inguinal hernia": ` ` ` Patient sent for surgical consultation at the request of [name], FNP  Chief Complaint: Left scrotal swelling. Concern for recurrent recurrent left inguinal hernia  The patient is a smoking male. Had large inguinal hernias repaired laparoscopically in October 2015. Developed left-sided recurrence and had open repair in July 2016. Diagnostic laparoscopy confirmed no recurrent right inguinal hernia. Good repair of left inguinal hernia. Patient noted some swelling in his left scrotum the following year. Ultrasound noted fat in the region, suspicious for recurrence. There is discussion about doing a CAT scan to further clarify. Patient went gotten second opinion with Dr. [name] in March. Small left inguinal hernia recurrence. Observation recommended since the patient was not particular symptomatic and was not particularly large. Patient comes in today for get another opinion. Patient notes the swelling has gone up. It is not particularly painful, but he is concerned that he's got a recurrent hernia wished to have it fixed. Denies any discomfort on the right side. No problems with urination or defecation. He had a heart attack in the distant past. Had cardiac clearance 2 years ago. No problems since then. No longer on blood thinners. DVT long time ago. No longer on blood thinners. Borderline hemoglobin A1c but no definite diabetes. He walks rather regularly. Did a 4 mile hike last month. Warm out but he was able to do it. Surgeries. No other abdominal surgeries. No history of MRSA infections. No difficulty with urination.   (Review of systems  as stated in this history (HPI) or in the review of systems. Otherwise all other 12 point ROS are negative)   Allergies Dalbert Mayotte, CMA; 07/09/2016 1:58 PM) Penicillins Flomax *GENITOURINARY AGENTS - MISCELLANEOUS* Allergies Reconciled  Medication History Dalbert Mayotte, CMA; 07/09/2016 2:02 PM) 5-Hydroxy-L-Tryptophan Specific strength unknown - Active. TraMADol HCl (50MG  Tablet, Oral) Active. Atarax (25MG  Tablet, Oral) Active. Vitamin B-12 (1000MCG Tablet, 1 (one) Oral) Active. Aspirin Childrens (81MG  Tablet Chewable, Oral daily) Active. Psyllium (58.6% Powder, Oral) Active. Tylenol Extra Strength (500MG  Tablet, Oral) Active. Glucosamine Chondr Complex (500-400MG  Capsule, Oral) Active. Lutein (10MG  Tablet, Oral) Active. Omega 3 (1000MG  Capsule, Oral) Active. Senna (8.6MG  Tablet, Oral) Active. Vitamin D (Cholecalciferol) (1000UNIT Capsule, Oral) Active. Naproxen Sodium (Oral) Specific strength unknown - Active. Medications Reconciled    Vitals Dalbert Mayotte CMA; 07/09/2016 2:02 PM) 07/09/2016 2:02 PM Weight: 195.2 lb Height: 70in Body Surface Area: 2.07 m Body Mass Index: 28.01 kg/m  Temp.: 97.76F  Pulse: 67 (Regular)  BP: 110/80 (Sitting, Left Arm, Standard)      Physical Exam Adin Hector MD; 07/09/2016 2:32 PM)  General Mental Status-Alert. General Appearance-Not in acute distress, Not Sickly. Orientation-Oriented X3. Hydration-Well hydrated. Voice-Normal.  Integumentary Global Assessment Upon inspection and palpation of skin surfaces of the - Axillae: non-tender, no inflammation or ulceration, no drainage. and Distribution of scalp and body hair is normal. General Characteristics Temperature - normal warmth is noted.  Head and Neck Head-normocephalic, atraumatic with no lesions or palpable masses. Face Global Assessment - atraumatic, no absence of expression. Neck Global Assessment - no abnormal movements, no  bruit auscultated on the right, no bruit auscultated on the left, no decreased range of motion, non-tender.  Trachea-midline. Thyroid Gland Characteristics - non-tender.  Eye Eyeball - Left-Extraocular movements intact, No Nystagmus. Eyeball - Right-Extraocular movements intact, No Nystagmus. Cornea - Left-No Hazy. Cornea - Right-No Hazy. Sclera/Conjunctiva - Left-No scleral icterus, No Discharge. Sclera/Conjunctiva - Right-No scleral icterus, No Discharge. Pupil - Left-Direct reaction to light normal. Pupil - Right-Direct reaction to light normal.  ENMT Ears Pinna - Left - no drainage observed, no generalized tenderness observed. Right - no drainage observed, no generalized tenderness observed. Nose and Sinuses External Inspection of the Nose - no destructive lesion observed. Inspection of the nares - Left - quiet respiration. Right - quiet respiration. Mouth and Throat Lips - Upper Lip - no fissures observed, no pallor noted. Lower Lip - no fissures observed, no pallor noted. Nasopharynx - no discharge present. Oral Cavity/Oropharynx - Tongue - no dryness observed. Oral Mucosa - no cyanosis observed. Hypopharynx - no evidence of airway distress observed.  Chest and Lung Exam Inspection Movements - Normal and Symmetrical. Accessory muscles - No use of accessory muscles in breathing. Palpation Palpation of the chest reveals - Non-tender. Auscultation Breath sounds - Normal and Clear.  Cardiovascular Auscultation Rhythm - Regular. Murmurs & Other Heart Sounds - Auscultation of the heart reveals - No Murmurs and No Systolic Clicks.  Abdomen Inspection Inspection of the abdomen reveals - No Visible peristalsis and No Abnormal pulsations. Umbilicus - No Bleeding, No Urine drainage. Palpation/Percussion Palpation and Percussion of the abdomen reveal - Soft, Non Tender, No Rebound tenderness, No Rigidity (guarding) and No Cutaneous hyperesthesia. Note: Abdomen  soft. Nontender, nondistended. No guarding. No diastasis. No umbilical nor other hernias  Male Genitourinary Sexual Maturity Tanner 5 - Adult hair pattern and Adult penile size and shape. Note: Nontender. Fullness particularly in the left scrotum that is soft and distinct from the testicle. It seems to track up medially. Cannot prove a definite direct space hernia but rather suspicious.. Laxity in both inguinal floors bilaterally. No strong evidence of any indirect inguinal hernias on either side. No inguinal lymphadenopathy. No large epididymitis. Normal external male genitalia. Testes of normal size without any discrete masses.  Pauletta Browns there is no evidence of any major epidermal cyst. No hydrocele. No varicocele. Definite fat, especially in the left scrotum.  Peripheral Vascular Upper Extremity Inspection - Left - No Cyanotic nailbeds, Not Ischemic. Right - No Cyanotic nailbeds, Not Ischemic.  Neurologic Neurologic evaluation reveals -normal attention span and ability to concentrate, able to name objects and repeat phrases. Appropriate fund of knowledge , normal sensation and normal coordination. Mental Status Affect - not angry, not paranoid. Cranial Nerves-Normal Bilaterally. Gait-Normal.  Neuropsychiatric Mental status exam performed with findings of-able to articulate well with normal speech/language, rate, volume and coherence, thought content normal with ability to perform basic computations and apply abstract reasoning and no evidence of hallucinations, delusions, obsessions or homicidal/suicidal ideation.  Musculoskeletal Global Assessment Spine, Ribs and Pelvis - no instability, subluxation or laxity. Right Upper Extremity - no instability, subluxation or laxity.  Lymphatic Head & Neck  General Head & Neck Lymphatics: Bilateral - Description - No Localized lymphadenopathy. Axillary  General Axillary Region: Bilateral - Description - No Localized  lymphadenopathy. Femoral & Inguinal  Generalized Femoral & Inguinal Lymphatics: Left - Description - No Localized lymphadenopathy. Right - Description - No Localized lymphadenopathy.    Assessment & Plan Adin Hector MD; 07/09/2016 2:34 PM)  RECURRENT LEFT INGUINAL HERNIA (K40.91) Impression: Increasing swelling in left scrotum suspicious for direct inguinal hernia recurrence. Another possibility is retained  fat or hydrocele. Ultrasound more strongly suggestive hernia. Rest of the differential diagnoses seems unlikely.  Because this is subtle, I suspect ultrasound and CT scan will only confirm fat in the scrotum and not able to tell for certain if there really is a hernia or not. In reviewing the ultrasound images, it is hard to delineate if there is truly hernia versus just increased soft tissue fat. Clinically per the patient, it seems to have worsened since it was noted many months ago. I would proceed with diagnostic laparoscopy. If there is no evidence of hernia recurrence, that is reassuring. If there is evidence for recurrence, plan laparoscopic preperitoneal lysis of adhesions with repair. Most likely try and not remove the old mesh from just buttress to reinforce it. Would try and use a different mesh this time since the ultra pro seems to be contracting. Parietex. Most likely would need multiple sheets for extra overlap.  I did caution he is at risk for vascular testicular blood loss injury. Risk of chronic groin pain. He's not particularlly tender right now but is concerned that is getting larger and worries about it worsening. He does not want to get as bad as when he had it first fixed.  I told him he needs to quit smoking to help minimize risk of recurrence. We will check a urine nicotine beforehand to make sure that he is quit. He understands that if he is positive for urine nicotine, I will cancel the case.  He is open to the suggestions and wishes to proceed. He promises to quit  smoking.  TOBACCO ABUSE (Z72.0)  Current Plans Pt Education - [facility] STOP SMOKING! PREOP - ING HERNIA - ENCOUNTER FOR PREOPERATIVE EXAMINATION FOR GENERAL SURGICAL PROCEDURE (Z01.818)  Current Plans You are being scheduled for surgery- Our schedulers will call you.  You should hear from our office's scheduling department within 5 working days about the location, date, and time of surgery. We try to make accommodations for patient's preferences in scheduling surgery, but sometimes the OR schedule or the surgeon's schedule prevents [place] from making those accommodations.  If you have not heard from our office [phone]) in 5 working days, call the office and ask for your surgeon's nurse.  If you have other questions about your diagnosis, plan, or surgery, call the office and ask for your surgeon's nurse.  Written instructions provided The anatomy & physiology of the abdominal wall and pelvic floor was discussed. The pathophysiology of hernias in the inguinal and pelvic region was discussed. Natural history risks such as progressive enlargement, pain, incarceration, and strangulation was discussed. Contributors to complications such as smoking, obesity, diabetes, prior surgery, etc were discussed.  I feel the risks of no intervention will lead to serious problems that outweigh the operative risks; therefore, I recommended surgery to reduce and repair the hernia. I explained laparoscopic techniques with possible need for an open approach. I noted usual use of mesh to patch and/or buttress hernia repair  Risks such as bleeding, infection, abscess, need for further treatment, heart attack, death, and other risks were discussed. I noted a good likelihood this will help address the problem. Goals of post-operative recovery were discussed as well. Possibility that this will not correct all symptoms was explained. I stressed the importance of low-impact activity, aggressive pain control,  avoiding constipation, & not pushing through pain to minimize risk of post-operative chronic pain or injury. Possibility of reherniation was discussed. We will work to minimize complications.  An educational handout further  explaining the pathology & treatment options was given as well. Questions were answered. The patient expresses understanding & wishes to proceed with surgery.  Pt Education - Pamphlet Given - Laparoscopic Hernia Repair: discussed with patient and provided information. Pt Education - [facility] Pain Control (Soffia Doshier) Pt Education - [facility] Hernia Post-Op HCI (Nicolette Gieske): discussed with patient and provided information.  Adin Hector, M.D., F.A.C.S. Gastrointestinal and Minimally Invasive Surgery [facility] [address], [place], [place] [address] [phone] Main / Paging

## 2016-07-17 ENCOUNTER — Ambulatory Visit (INDEPENDENT_AMBULATORY_CARE_PROVIDER_SITE_OTHER): Payer: Medicare Other | Admitting: Pharmacist

## 2016-07-17 ENCOUNTER — Encounter: Payer: Self-pay | Admitting: Pharmacist

## 2016-07-17 VITALS — BP 110/64 | HR 60 | Ht 70.0 in | Wt 194.0 lb

## 2016-07-17 DIAGNOSIS — E119 Type 2 diabetes mellitus without complications: Secondary | ICD-10-CM | POA: Diagnosis not present

## 2016-07-17 DIAGNOSIS — F17200 Nicotine dependence, unspecified, uncomplicated: Secondary | ICD-10-CM | POA: Diagnosis not present

## 2016-07-17 DIAGNOSIS — I2510 Atherosclerotic heart disease of native coronary artery without angina pectoris: Secondary | ICD-10-CM

## 2016-07-17 MED ORDER — BLOOD GLUCOSE MONITOR KIT: PACK | 0 refills | Status: AC

## 2016-07-17 MED ORDER — BUPROPION HCL ER (SR) 150 MG PO TB12: 150.0000 mg | ORAL_TABLET | Freq: Two times a day (BID) | ORAL | 0 refills | Status: DC

## 2016-07-17 NOTE — Patient Instructions (Addendum)
You have been scheduled for an appointment with [facility] on Monday, July 23rd at 1:20 pm - their office is located in front of the old [place] building in [place].  The phone number there is [phone].   Diabetes and Standards of Medical Care   Diabetes is complicated. You may find that your diabetes team includes a dietitian, nurse, diabetes educator, eye doctor, and more. To help everyone know what is going on and to help you get the care you deserve, the following schedule of care was developed to help keep you on track. Below are the tests, exams, vaccines, medicines, education, and plans you will need.  Blood Glucose Goals Prior to meals = 80 - 130 Within 2 hours of the start of a meal = less than 180  HbA1c test (goal is less than 7.0% - your last value was 7.4%) This test shows how well you have controlled your glucose over the past 2 to 3 months. It is used to see if your diabetes management plan needs to be adjusted.   It is performed at least 2 times a year if you are meeting treatment goals.  It is performed 4 times a year if therapy has changed or if you are not meeting treatment goals.  Blood pressure test  This test is performed at every routine medical visit. The goal is less than 140/90 mmHg for most people, but 130/80 mmHg in some cases. Ask your health care provider about your goal.  Dental exam  Follow up with the dentist regularly.  Eye exam  If you are diagnosed with type 1 diabetes as a child, get an exam upon reaching the age of 14 years or older and have had diabetes for 3 to 5 years. Yearly eye exams are recommended after that initial eye exam.  If you are diagnosed with type 1 diabetes as an adult, get an exam within 5 years of diagnosis and then yearly.  If you are diagnosed with type 2 diabetes, get an exam as soon as possible after the diagnosis and then yearly.  Foot care exam  Visual foot exams are performed at every routine medical visit. The  exams check for cuts, injuries, or other problems with the feet.  A comprehensive foot exam should be done yearly. This includes visual inspection as well as assessing foot pulses and testing for loss of sensation.  Check your feet nightly for cuts, injuries, or other problems with your feet. Tell your health care provider if anything is not healing.  Kidney function test (urine microalbumin)  This test is performed once a year.  Type 1 diabetes: The first test is performed 5 years after diagnosis.  Type 2 diabetes: The first test is performed at the time of diagnosis.  A serum creatinine and estimated glomerular filtration rate (eGFR) test is done once a year to assess the level of chronic kidney disease (CKD), if present.  Lipid profile (cholesterol, HDL, LDL, triglycerides)  Performed every 5 years for most people.  The goal for LDL is less than 100 mg/dL. If you are at high risk, the goal is less than 70 mg/dL.  The goal for HDL is 40 mg/dL to 50 mg/dL for men and 50 mg/dL to 60 mg/dL for women. An HDL cholesterol of 60 mg/dL or higher gives some protection against heart disease.  The goal for triglycerides is less than 150 mg/dL.  Influenza vaccine, pneumococcal vaccine, and hepatitis B vaccine  The influenza vaccine is recommended  yearly.  The pneumococcal vaccine is generally given once in a lifetime. However, there are some instances when another vaccination is recommended. Check with your health care provider.  The hepatitis B vaccine is also recommended for adults with diabetes.  Diabetes self-management education  Education is recommended at diagnosis and ongoing as needed.  Treatment plan  Your treatment plan is reviewed at every medical visit.  Document Released: 10/22/2008 Document Revised: 08/27/2012 Document Reviewed: 05/27/2012 [facility] Patient Information 2014 [place].

## 2016-07-17 NOTE — Progress Notes (Signed)
Patient ID: Matas Burrows., male   DOB: Apr 27, 1950, 66 y.o.   MRN: [id number]   Subjective:    Anthony [name]. is a 66 y.o. male who presents for an initial evaluation of Type 2 diabetes mellitus and education.  Patient was initially diagnosed with tyep 2 DM May 2018 based on FBG of 152 and A1c of 7.4%/   Current symptoms/problems include none  Current diabetic medications include Metformin XR 500mg  take 1 tablet daily.    Current monitoring regimen: none Home blood sugar records: n/a Any episodes of hypoglycemia? no  Known diabetic complications: none Cardiovascular risk factors: advanced age (older than 83 for men, 35 for women), diabetes mellitus, dyslipidemia, male gender and smoking/ tobacco exposure Eye exam current (within one year): no but has appt 07/30/16 Weight trend: stable Prior visit with CDE: no Current diet: not counting carbs but overall he has a fairly healthy diet - eating vegetables, lean proteins and fruits on regular basis. Current exercise: walking Medication Compliance?  No - reports not taking mirtazepine / Remeron  Interesting in quitting smoking.  Is He on ACE inhibitor or angiotensin II receptor blocker?  No      The following portions of the patient's history were reviewed and updated as appropriate: allergies, current medications, past family history, past medical history, past social history, past surgical history and problem list.    Objective:    BP 110/64   Pulse 60   Ht 5\' 10"  (1.778 m)   Wt 194 lb (88 kg)   BMI 27.84 kg/m    A1c = 7.4% (05/21/2016)  BMP Latest Ref Rng & Units 05/21/2016 08/16/2015 05/11/2015  Glucose 65 - 99 mg/dL 152(H) 151(H) 154(H)  BUN 8 - 27 mg/dL 16 17 14   Creatinine 0.76 - 1.27 mg/dL 0.94 0.91 0.94  BUN/Creat Ratio 10 - 24 17 19  -  Sodium 134 - 144 mmol/L 137 141 139  Potassium 3.5 - 5.2 mmol/L 4.2 4.8 4.4  Chloride 96 - 106 mmol/L 96 100 106  CO2 18 - 29 mmol/L 20 25 25   Calcium 8.6 - 10.2 mg/dL 9.8 9.0  8.6(L)      Assessment:    Diabetes Mellitus type II, under inadequate control. - newly diagnosed DM.   Plan:    1.  Rx changes:   Continue metformin XR 500mg  qd  Start wellburtin XL 150mg  bid for smoking cessation 2.  Education: Reviewed 'ABCs' of diabetes management (respective goals in parentheses):  A1C (<7), blood pressure (<130/80), and cholesterol (LDL <100). Reminded of up coming eye exam - pt did not seem to know about appt. 3. Discussed pathophysiology of DM; difference between type 1 and type 2 DM. 4. CHO counting diet discussed.  Reviewed CHO amount in various foods and how to read nutrition labels.  Discussed recommended serving sizes.  5.  Recommend check BG qd to qod  times a day 6.  Recommended continue physical activity - goal is 150 minutes per week 7.  Discussed tips for smoking cessation.   8.  Follow up with PCP in August - appt made today.

## 2016-07-30 DIAGNOSIS — E119 Type 2 diabetes mellitus without complications: Secondary | ICD-10-CM | POA: Diagnosis not present

## 2016-07-30 DIAGNOSIS — H4321 Crystalline deposits in vitreous body, right eye: Secondary | ICD-10-CM | POA: Diagnosis not present

## 2016-07-30 DIAGNOSIS — H2513 Age-related nuclear cataract, bilateral: Secondary | ICD-10-CM | POA: Diagnosis not present

## 2016-07-30 DIAGNOSIS — H43813 Vitreous degeneration, bilateral: Secondary | ICD-10-CM | POA: Diagnosis not present

## 2016-07-30 LAB — HM DIABETES EYE EXAM

## 2016-08-03 ENCOUNTER — Telehealth: Payer: Self-pay | Admitting: Family

## 2016-08-03 NOTE — Telephone Encounter (Signed)
Dr Alvan Dame - 05/10/15 Pt aware by detailed VM

## 2016-08-23 ENCOUNTER — Encounter: Payer: Self-pay | Admitting: Family

## 2016-08-23 ENCOUNTER — Ambulatory Visit (INDEPENDENT_AMBULATORY_CARE_PROVIDER_SITE_OTHER): Payer: Medicare Other | Admitting: Family

## 2016-08-23 VITALS — BP 117/78 | HR 61 | Temp 97.9°F | Ht 70.0 in | Wt 197.8 lb

## 2016-08-23 DIAGNOSIS — Z0289 Encounter for other administrative examinations: Secondary | ICD-10-CM

## 2016-08-23 DIAGNOSIS — Z1159 Encounter for screening for other viral diseases: Secondary | ICD-10-CM

## 2016-08-23 DIAGNOSIS — I2510 Atherosclerotic heart disease of native coronary artery without angina pectoris: Secondary | ICD-10-CM

## 2016-08-23 DIAGNOSIS — J439 Emphysema, unspecified: Secondary | ICD-10-CM | POA: Diagnosis not present

## 2016-08-23 DIAGNOSIS — E7849 Other hyperlipidemia: Secondary | ICD-10-CM

## 2016-08-23 DIAGNOSIS — F333 Major depressive disorder, recurrent, severe with psychotic symptoms: Secondary | ICD-10-CM

## 2016-08-23 DIAGNOSIS — Z720 Tobacco use: Secondary | ICD-10-CM | POA: Diagnosis not present

## 2016-08-23 DIAGNOSIS — E119 Type 2 diabetes mellitus without complications: Secondary | ICD-10-CM | POA: Diagnosis not present

## 2016-08-23 DIAGNOSIS — F331 Major depressive disorder, recurrent, moderate: Secondary | ICD-10-CM | POA: Diagnosis not present

## 2016-08-23 DIAGNOSIS — E784 Other hyperlipidemia: Secondary | ICD-10-CM

## 2016-08-23 DIAGNOSIS — E669 Obesity, unspecified: Secondary | ICD-10-CM | POA: Diagnosis not present

## 2016-08-23 DIAGNOSIS — F1120 Opioid dependence, uncomplicated: Secondary | ICD-10-CM

## 2016-08-23 LAB — BAYER DCA HB A1C WAIVED: HB A1C (BAYER DCA - WAIVED): 6.7 % (ref <7.0)

## 2016-08-23 NOTE — Progress Notes (Signed)
Subjective:    Patient ID: [name], male    DOB: March 02, 1950, 66 y.o.   MRN: [id number]  Pt presents to the office today for chronic follow up. PT is followed by [facility]  for depression and insomnia, but has moved and states he has got off on his schedule. PT states his depression is worse.   PT does have COPD and has tried stop smoking and has smoked 1/2 pack over the last month. PT denies any SOB or cough at this time. Pt has hx of pulmonary nodules.   PT has CAD and is followed by Cardiologists every 6 months.  Diabetes  He presents for his follow-up diabetic visit. He has type 2 diabetes mellitus. His disease course has been stable. There are no hypoglycemic associated symptoms. Pertinent negatives for diabetes include no blurred vision, no foot paresthesias, no foot ulcerations and no visual change. There are no hypoglycemic complications. Symptoms are stable. Diabetic complications include heart disease. Pertinent negatives for diabetic complications include no CVA or peripheral neuropathy. He is following a generally unhealthy diet. (Does not check BS at homes ) Eye exam is current.  Hyperlipidemia  This is a chronic problem. The current episode started more than 1 year ago. The problem is uncontrolled. Recent lipid tests were reviewed and are high. Exacerbating diseases include obesity. Pertinent negatives include no shortness of breath. Current antihyperlipidemic treatment includes statins. The current treatment provides mild improvement of lipids. Risk factors for coronary artery disease include diabetes mellitus, dyslipidemia, male sex, obesity and a sedentary lifestyle.  Hypertension  This is a chronic problem. The current episode started more than 1 year ago. The problem has been resolved since onset. The problem is controlled. Associated symptoms include malaise/fatigue. Pertinent negatives include no blurred vision, peripheral edema or shortness of breath. Risk  factors for coronary artery disease include dyslipidemia, obesity, sedentary lifestyle and diabetes mellitus. The current treatment provides moderate improvement. Hypertensive end-organ damage includes CAD/MI. There is no history of kidney disease or CVA.  COPD PT not using any inhalers at this time. PT is working on quitting smoking. Denies any SOB or cough.     Review of Systems  Constitutional: Positive for malaise/fatigue.  Eyes: Negative for blurred vision.  Respiratory: Negative for shortness of breath.   All other systems reviewed and are negative.      Objective:   Physical Exam  Constitutional: He is oriented to person, place, and time. He appears well-developed and well-nourished. No distress.  HENT:  Head: Normocephalic.  Right Ear: External ear normal.  Left Ear: External ear normal.  Nose: Nose normal.  Mouth/Throat: Oropharynx is clear and moist.  Eyes: Pupils are equal, round, and reactive to light. Right eye exhibits no discharge. Left eye exhibits no discharge.  Neck: Normal range of motion. Neck supple. No thyromegaly present.  Cardiovascular: Normal rate, regular rhythm, normal heart sounds and intact distal pulses.   No murmur heard. Pulmonary/Chest: Effort normal and breath sounds normal. No respiratory distress. He has no wheezes.  Abdominal: Soft. Bowel sounds are normal. He exhibits no distension. There is no tenderness.  Musculoskeletal: Normal range of motion. He exhibits no edema or tenderness.  Neurological: He is alert and oriented to person, place, and time.  Skin: Skin is warm and dry. No rash noted. No erythema.  Psychiatric: He has a normal mood and affect. His behavior is normal. Judgment and thought content normal.  Vitals reviewed.   Diabetic Foot Exam - Simple  Simple Foot Form Diabetic Foot exam was performed with the following findings:  Yes 08/23/2016  2:42 PM  Visual Inspection No deformities, no ulcerations, no other skin breakdown  bilaterally:  Yes Sensation Testing Intact to touch and monofilament testing bilaterally:  Yes Pulse Check Posterior Tibialis and Dorsalis pulse intact bilaterally:  Yes Comments      BP 117/78   Pulse 61   Temp 97.9 F (36.6 C) (Oral)   Ht 5' 10"  (1.778 m)   Wt 197 lb 12.8 oz (89.7 kg)   BMI 28.38 kg/m      Assessment & Plan:  1. Coronary artery disease involving native coronary artery of native heart without angina pectoris - CMP14+EGFR  2. Diabetes mellitus without complication (HCC) - Bayer DCA Hb A1c Waived - CMP14+EGFR  3. Tobacco abuse - CMP14+EGFR  4. Pain medication agreement signed - CMP14+EGFR  5. Uncomplicated opioid dependence ([place] - CMP14+EGFR  6. Obesity (BMI 30-39.9)  - CMP14+EGFR  7. Other hyperlipidemia - CMP14+EGFR - Lipid panel  8. Major psychotic depression, recurrent ([place]) - CMP14+EGFR - Ambulatory referral to Psychiatry  9. Moderate episode of recurrent major depressive disorder ([place]) - CMP14+EGFR - Ambulatory referral to Psychiatry  10. Pulmonary emphysema, unspecified emphysema type ([place])  - CMP14+EGFR  11. Need for hepatitis C screening test - Hepatitis C antibody   Continue all meds Labs pending Health Maintenance reviewed Diet and exercise encouraged RTO 3 months   [name], FNP

## 2016-08-23 NOTE — Patient Instructions (Signed)
Diabetes Mellitus and Food It is important for you to manage your blood sugar (glucose) level. Your blood glucose level can be greatly affected by what you eat. Eating healthier foods in the appropriate amounts throughout the day at about the same time each day will help you control your blood glucose level. It can also help slow or prevent worsening of your diabetes mellitus. Healthy eating may even help you improve the level of your blood pressure and reach or maintain a healthy weight. General recommendations for healthful eating and cooking habits include:  Eating meals and snacks regularly. Avoid going long periods of time without eating to lose weight.  Eating a diet that consists mainly of plant-based foods, such as fruits, vegetables, nuts, legumes, and whole grains.  Using low-heat cooking methods, such as baking, instead of high-heat cooking methods, such as deep frying.  Work with your dietitian to make sure you understand how to use the Nutrition Facts information on food labels. How can food affect me? Carbohydrates Carbohydrates affect your blood glucose level more than any other type of food. Your dietitian will help you determine how many carbohydrates to eat at each meal and teach you how to count carbohydrates. Counting carbohydrates is important to keep your blood glucose at a healthy level, especially if you are using insulin or taking certain medicines for diabetes mellitus. Alcohol Alcohol can cause sudden decreases in blood glucose (hypoglycemia), especially if you use insulin or take certain medicines for diabetes mellitus. Hypoglycemia can be a life-threatening condition. Symptoms of hypoglycemia (sleepiness, dizziness, and disorientation) are similar to symptoms of having too much alcohol. If your health care provider has given you approval to drink alcohol, do so in moderation and use the following guidelines:  Women should not have more than one drink per day, and men  should not have more than two drinks per day. One drink is equal to: ? 12 oz of beer. ? 5 oz of wine. ? 1 oz of hard liquor.  Do not drink on an empty stomach.  Keep yourself hydrated. Have water, diet soda, or unsweetened iced tea.  Regular soda, juice, and other mixers might contain a lot of carbohydrates and should be counted.  What foods are not recommended? As you make food choices, it is important to remember that all foods are not the same. Some foods have fewer nutrients per serving than other foods, even though they might have the same number of calories or carbohydrates. It is difficult to get your body what it needs when you eat foods with fewer nutrients. Examples of foods that you should avoid that are high in calories and carbohydrates but low in nutrients include:  Trans fats (most processed foods list trans fats on the Nutrition Facts label).  Regular soda.  Juice.  Candy.  Sweets, such as cake, pie, doughnuts, and cookies.  Fried foods.  What foods can I eat? Eat nutrient-rich foods, which will nourish your body and keep you healthy. The food you should eat also will depend on several factors, including:  The calories you need.  The medicines you take.  Your weight.  Your blood glucose level.  Your blood pressure level.  Your cholesterol level.  You should eat a variety of foods, including:  Protein. ? Lean cuts of meat. ? Proteins low in saturated fats, such as fish, egg whites, and beans. Avoid processed meats.  Fruits and vegetables. ? Fruits and vegetables that may help control blood glucose levels, such as apples,   mangoes, and yams.  Dairy products. ? Choose fat-free or low-fat dairy products, such as milk, yogurt, and cheese.  Grains, bread, pasta, and rice. ? Choose whole grain products, such as multigrain bread, whole oats, and brown rice. These foods may help control blood pressure.  Fats. ? Foods containing healthful fats, such as  nuts, avocado, olive oil, canola oil, and fish.  Does everyone with diabetes mellitus have the same meal plan? Because every person with diabetes mellitus is different, there is not one meal plan that works for everyone. It is very important that you meet with a dietitian who will help you create a meal plan that is just right for you. This information is not intended to replace advice given to you by your health care provider. Make sure you discuss any questions you have with your health care provider. Document Released: 09/21/2004 Document Revised: 06/02/2015 Document Reviewed: 11/21/2012 Elsevier Interactive Patient Education  2017 Elsevier Inc.  

## 2016-08-24 LAB — CMP14+EGFR
A/G RATIO: 1.7 (ref 1.2–2.2)
ALK PHOS: 66 IU/L (ref 39–117)
ALT: 22 IU/L (ref 0–44)
AST: 20 IU/L (ref 0–40)
Albumin: 4.8 g/dL (ref 3.6–4.8)
BUN/Creatinine Ratio: 15 (ref 10–24)
BUN: 14 mg/dL (ref 8–27)
Bilirubin Total: 0.5 mg/dL (ref 0.0–1.2)
CALCIUM: 9.2 mg/dL (ref 8.6–10.2)
CO2: 23 mmol/L (ref 20–29)
Chloride: 100 mmol/L (ref 96–106)
Creatinine, Ser: 0.92 mg/dL (ref 0.76–1.27)
GFR calc Af Amer: 99 mL/min/{1.73_m2} (ref >59)
GFR, EST NON AFRICAN AMERICAN: 86 mL/min/{1.73_m2} (ref >59)
Globulin, Total: 2.9 g/dL (ref 1.5–4.5)
Glucose: 138 mg/dL — ABNORMAL HIGH (ref 65–99)
POTASSIUM: 5.1 mmol/L (ref 3.5–5.2)
Sodium: 137 mmol/L (ref 134–144)
Total Protein: 7.7 g/dL (ref 6.0–8.5)

## 2016-08-24 LAB — LIPID PANEL
CHOL/HDL RATIO: 3.6 ratio (ref 0.0–5.0)
CHOLESTEROL TOTAL: 157 mg/dL (ref 100–199)
HDL: 44 mg/dL (ref >39)
LDL Calculated: 76 mg/dL (ref 0–99)
TRIGLYCERIDES: 183 mg/dL — AB (ref 0–149)
VLDL Cholesterol Cal: 37 mg/dL (ref 5–40)

## 2016-08-24 LAB — HEPATITIS C ANTIBODY: Hep C Virus Ab: <0.1 s/co ratio (ref 0.0–0.9)

## 2016-09-06 ENCOUNTER — Ambulatory Visit (INDEPENDENT_AMBULATORY_CARE_PROVIDER_SITE_OTHER): Payer: Medicare Other | Admitting: Family

## 2016-09-06 ENCOUNTER — Encounter: Payer: Self-pay | Admitting: Family

## 2016-09-06 ENCOUNTER — Telehealth: Payer: Self-pay | Admitting: Family

## 2016-09-06 VITALS — BP 117/73 | HR 96 | Temp 97.3°F | Ht 70.0 in | Wt 201.0 lb

## 2016-09-06 DIAGNOSIS — J439 Emphysema, unspecified: Secondary | ICD-10-CM

## 2016-09-06 DIAGNOSIS — I2510 Atherosclerotic heart disease of native coronary artery without angina pectoris: Secondary | ICD-10-CM

## 2016-09-06 DIAGNOSIS — J189 Pneumonia, unspecified organism: Secondary | ICD-10-CM | POA: Diagnosis not present

## 2016-09-06 DIAGNOSIS — F17200 Nicotine dependence, unspecified, uncomplicated: Secondary | ICD-10-CM | POA: Diagnosis not present

## 2016-09-06 MED ORDER — BUDESONIDE-FORMOTEROL FUMARATE 80-4.5 MCG/ACT IN AERO: 2.0000 | INHALATION_SPRAY | Freq: Two times a day (BID) | RESPIRATORY_TRACT | 3 refills | Status: DC

## 2016-09-06 MED ORDER — FLUTICASONE FUROATE-VILANTEROL 100-25 MCG/INH IN AEPB: 1.0000 | INHALATION_SPRAY | Freq: Every day | RESPIRATORY_TRACT | 11 refills | Status: DC

## 2016-09-06 MED ORDER — DOXYCYCLINE HYCLATE 100 MG PO TABS: 100.0000 mg | ORAL_TABLET | Freq: Two times a day (BID) | ORAL | 0 refills | Status: DC

## 2016-09-06 MED ORDER — PREDNISONE 10 MG (21) PO TBPK: ORAL_TABLET | ORAL | 0 refills | Status: DC

## 2016-09-06 NOTE — Patient Instructions (Signed)
Chronic Obstructive Pulmonary Disease Chronic obstructive pulmonary disease (COPD) is a common lung condition in which airflow from the lungs is limited. COPD is a general term that can be used to describe many different lung problems that limit airflow, including both chronic bronchitis and emphysema. If you have COPD, your lung function will probably never return to normal, but there are measures you can take to improve lung function and make yourself feel better. What are the causes?  Smoking (common).  Exposure to secondhand smoke.  Genetic problems.  Chronic inflammatory lung diseases or recurrent infections. What are the signs or symptoms?  Shortness of breath, especially with physical activity.  Deep, persistent (chronic) cough with a large amount of thick mucus.  Wheezing.  Rapid breaths (tachypnea).  Gray or bluish discoloration (cyanosis) of the skin, especially in your fingers, toes, or lips.  Fatigue.  Weight loss.  Frequent infections or episodes when breathing symptoms become much worse (exacerbations).  Chest tightness. How is this diagnosed? Your health care provider will take a medical history and perform a physical examination to diagnose COPD. Additional tests for COPD may include:  Lung (pulmonary) function tests.  Chest X-ray.  CT scan.  Blood tests. How is this treated? Treatment for COPD may include:  Inhaler and nebulizer medicines. These help manage the symptoms of COPD and make your breathing more comfortable.  Supplemental oxygen. Supplemental oxygen is only helpful if you have a low oxygen level in your blood.  Exercise and physical activity. These are beneficial for nearly all people with COPD.  Lung surgery or transplant.  Nutrition therapy to gain weight, if you are underweight.  Pulmonary rehabilitation. This may involve working with a team of health care providers and specialists, such as respiratory, occupational, and physical  therapists. Follow these instructions at home:  Take all medicines (inhaled or pills) as directed by your health care provider.  Avoid over-the-counter medicines or cough syrups that dry up your airway (such as antihistamines) and slow down the elimination of secretions unless instructed otherwise by your health care provider.  If you are a smoker, the most important thing that you can do is stop smoking. Continuing to smoke will cause further lung damage and breathing trouble. Ask your health care provider for help with quitting smoking. He or she can direct you to community resources or hospitals that provide support.  Avoid exposure to irritants such as smoke, chemicals, and fumes that aggravate your breathing.  Use oxygen therapy and pulmonary rehabilitation if directed by your health care provider. If you require home oxygen therapy, ask your health care provider whether you should purchase a pulse oximeter to measure your oxygen level at home.  Avoid contact with individuals who have a contagious illness.  Avoid extreme temperature and humidity changes.  Eat healthy foods. Eating smaller, more frequent meals and resting before meals may help you maintain your strength.  Stay active, but balance activity with periods of rest. Exercise and physical activity will help you maintain your ability to do things you want to do.  Preventing infection and hospitalization is very important when you have COPD. Make sure to receive all the vaccines your health care provider recommends, especially the pneumococcal and influenza vaccines. Ask your health care provider whether you need a pneumonia vaccine.  Learn and use relaxation techniques to manage stress.  Learn and use controlled breathing techniques as directed by your health care provider. Controlled breathing techniques include: 1. Pursed lip breathing. Start by breathing in (inhaling)   through your nose for 1 second. Then, purse your lips as  if you were going to whistle and breathe out (exhale) through the pursed lips for 2 seconds. 2. Diaphragmatic breathing. Start by putting one hand on your abdomen just above your waist. Inhale slowly through your nose. The hand on your abdomen should move out. Then purse your lips and exhale slowly. You should be able to feel the hand on your abdomen moving in as you exhale.  Learn and use controlled coughing to clear mucus from your lungs. Controlled coughing is a series of short, progressive coughs. The steps of controlled coughing are: 1. Lean your head slightly forward. 2. Breathe in deeply using diaphragmatic breathing. 3. Try to hold your breath for 3 seconds. 4. Keep your mouth slightly open while coughing twice. 5. Spit any mucus out into a tissue. 6. Rest and repeat the steps once or twice as needed. Contact a health care provider if:  You are coughing up more mucus than usual.  There is a change in the color or thickness of your mucus.  Your breathing is more labored than usual.  Your breathing is faster than usual. Get help right away if:  You have shortness of breath while you are resting.  You have shortness of breath that prevents you from:  Being able to talk.  Performing your usual physical activities.  You have chest pain lasting longer than 5 minutes.  Your skin color is more cyanotic than usual.  You measure low oxygen saturations for longer than 5 minutes with a pulse oximeter. This information is not intended to replace advice given to you by your health care provider. Make sure you discuss any questions you have with your health care provider. Document Released: 10/04/2004 Document Revised: 06/02/2015 Document Reviewed: 08/21/2012 Elsevier Interactive Patient Education  2017 Elsevier Inc.  

## 2016-09-06 NOTE — Telephone Encounter (Signed)
Samples left for patient of symbicort. Detailed message left for patient that I have put samples up front to be picked up.

## 2016-09-06 NOTE — Telephone Encounter (Signed)
Patient aware.

## 2016-09-06 NOTE — Telephone Encounter (Signed)
Can we see if we have samples of either one of these medications for the patient.

## 2016-09-06 NOTE — Addendum Note (Signed)
Addended by: Thana Ates on: 09/06/2016 04:12 PM   Modules accepted: Orders

## 2016-09-06 NOTE — Progress Notes (Signed)
Subjective:    Patient ID: Anthony [name]., male    DOB: 12/04/1950, 66 y.o.   MRN: [id number]  Cough  This is a new problem. The current episode started 1 to 4 weeks ago. The problem has been waxing and waning. The problem occurs every few minutes. The cough is non-productive. Associated symptoms include chills, a fever, headaches, myalgias, nasal congestion, postnasal drip, rhinorrhea, a sore throat and wheezing. Pertinent negatives include no ear congestion, ear pain or shortness of breath. The symptoms are aggravated by lying down. Risk factors for lung disease include smoking/tobacco exposure. He has tried rest and OTC cough suppressant for the symptoms. The treatment provided mild relief. His past medical history is significant for COPD.      Review of Systems  Constitutional: Positive for chills and fever.  HENT: Positive for postnasal drip, rhinorrhea and sore throat. Negative for ear pain.   Respiratory: Positive for cough and wheezing. Negative for shortness of breath.   Musculoskeletal: Positive for myalgias.  Neurological: Positive for headaches.  All other systems reviewed and are negative.      Objective:   Physical Exam  Constitutional: He is oriented to person, place, and time. He appears well-developed and well-nourished. No distress.  HENT:  Head: Normocephalic.  Right Ear: External ear normal.  Left Ear: External ear normal.  Nose: Mucosal edema and rhinorrhea present.  Mouth/Throat: Oropharynx is clear and moist.  Eyes: Pupils are equal, round, and reactive to light. Right eye exhibits no discharge. Left eye exhibits no discharge.  Neck: Normal range of motion. Neck supple. No thyromegaly present.  Cardiovascular: Normal rate, regular rhythm, normal heart sounds and intact distal pulses.   No murmur heard. Pulmonary/Chest: Effort normal. No respiratory distress. He has wheezes. He has rhonchi.  Abdominal: Soft. Bowel sounds are normal. He exhibits no  distension. There is no tenderness.  Musculoskeletal: Normal range of motion. He exhibits no edema or tenderness.  Neurological: He is alert and oriented to person, place, and time.  Skin: Skin is warm and dry. No rash noted. No erythema.  Psychiatric: He has a normal mood and affect. His behavior is normal. Judgment and thought content normal.  Vitals reviewed.     BP 117/73   Pulse 96   Temp (!) 97.3 F (36.3 C) (Oral)   Ht 5\' 10"  (1.778 m)   Wt 201 lb (91.2 kg)   BMI 28.84 kg/m      Assessment & Plan:  1. Pulmonary emphysema, unspecified emphysema type ([place]) - budesonide-formoterol (SYMBICORT) 80-4.5 MCG/ACT inhaler; Inhale 2 puffs into the lungs 2 (two) times daily.  Dispense: 1 Inhaler; Refill: 3 - predniSONE (STERAPRED UNI-PAK 21 TAB) 10 MG (21) TBPK tablet; Use as directed  Dispense: 21 tablet; Refill: 0 - doxycycline (VIBRA-TABS) 100 MG tablet; Take 1 tablet (100 mg total) by mouth 2 (two) times daily.  Dispense: 20 tablet; Refill: 0  2. Atypical pneumonia - budesonide-formoterol (SYMBICORT) 80-4.5 MCG/ACT inhaler; Inhale 2 puffs into the lungs 2 (two) times daily.  Dispense: 1 Inhaler; Refill: 3 - predniSONE (STERAPRED UNI-PAK 21 TAB) 10 MG (21) TBPK tablet; Use as directed  Dispense: 21 tablet; Refill: 0 - doxycycline (VIBRA-TABS) 100 MG tablet; Take 1 tablet (100 mg total) by mouth 2 (two) times daily.  Dispense: 20 tablet; Refill: 0  3. Current smoker Smoking cessation discussed  Started pt on Symbicort  Strict low carb diet Mucinex  Smoking cessation discussed  RTO prn     [name],  FNP  

## 2016-09-06 NOTE — Telephone Encounter (Signed)
Patient called back and states [facility] is going to be over $400. Please advise

## 2016-09-06 NOTE — Telephone Encounter (Signed)
Breo Prescription sent to pharmacy to see if this is a cheaper option.

## 2016-09-13 ENCOUNTER — Telehealth: Payer: Self-pay | Admitting: Family

## 2016-09-13 MED ORDER — LEVOFLOXACIN 500 MG PO TABS: 500.0000 mg | ORAL_TABLET | Freq: Every day | ORAL | 0 refills | Status: DC

## 2016-09-13 NOTE — Telephone Encounter (Signed)
Pt stop doxycyline and stare Levaquin Prescription sent to pharmacy

## 2016-09-13 NOTE — Telephone Encounter (Signed)
Seen [facility] 8/30- please review and advise if something else can be sent in.

## 2016-09-13 NOTE — Telephone Encounter (Signed)
Aware. 

## 2016-09-25 ENCOUNTER — Encounter: Payer: Self-pay | Admitting: Family

## 2016-09-25 ENCOUNTER — Ambulatory Visit (INDEPENDENT_AMBULATORY_CARE_PROVIDER_SITE_OTHER): Payer: Medicare Other | Admitting: Family

## 2016-09-25 VITALS — BP 111/75 | HR 88 | Temp 97.1°F | Ht 70.0 in | Wt 202.0 lb

## 2016-09-25 DIAGNOSIS — J439 Emphysema, unspecified: Secondary | ICD-10-CM

## 2016-09-25 DIAGNOSIS — I2510 Atherosclerotic heart disease of native coronary artery without angina pectoris: Secondary | ICD-10-CM | POA: Diagnosis not present

## 2016-09-25 DIAGNOSIS — R059 Cough, unspecified: Secondary | ICD-10-CM

## 2016-09-25 DIAGNOSIS — R05 Cough: Secondary | ICD-10-CM | POA: Diagnosis not present

## 2016-09-25 MED ORDER — FLUTICASONE PROPIONATE 50 MCG/ACT NA SUSP: 2.0000 | Freq: Every day | NASAL | 6 refills | Status: DC

## 2016-09-25 MED ORDER — FLUTICASONE-UMECLIDIN-VILANT 100-62.5-25 MCG/INH IN AEPB: 1.0000 | INHALATION_SPRAY | Freq: Every day | RESPIRATORY_TRACT | 3 refills | Status: DC

## 2016-09-25 MED ORDER — PREDNISONE 20 MG PO TABS: ORAL_TABLET | ORAL | 0 refills | Status: DC

## 2016-09-25 NOTE — Progress Notes (Signed)
   Subjective:    Patient ID: Anthony Form., male    DOB: [birth year], 66 y.o.   MRN: [id number]  Pt presents to the office today with recurrent cough. Pt was seen on 09/06/16 given prednisone and doxycycline, and started on Symbicort BID. Pt call on 09/13/16 and states the cough was worse and was told to stop doxycycline and start Levaquin. Pt states he continues to have a cough. 9 Cough  This is a recurrent problem. The current episode started more than 1 month ago. The problem has been waxing and waning. The problem occurs every few minutes. The cough is non-productive. Associated symptoms include myalgias, nasal congestion, postnasal drip and wheezing. Pertinent negatives include no chills, ear congestion, ear pain, headaches or shortness of breath. The symptoms are aggravated by lying down. Risk factors for lung disease include smoking/tobacco exposure. He has tried rest, oral steroids and OTC cough suppressant for the symptoms. The treatment provided mild relief. His past medical history is significant for COPD.      Review of Systems  Constitutional: Negative for chills.  HENT: Positive for postnasal drip. Negative for ear pain.   Respiratory: Positive for cough and wheezing. Negative for shortness of breath.   Musculoskeletal: Positive for myalgias.  Neurological: Negative for headaches.  All other systems reviewed and are negative.      Objective:   Physical Exam  Constitutional: He is oriented to person, place, and time. He appears well-developed and well-nourished. No distress.  HENT:  Head: Normocephalic.  Right Ear: External ear normal.  Left Ear: External ear normal.  Mouth/Throat: Oropharynx is clear and moist.  Eyes: Pupils are equal, round, and reactive to light. Right eye exhibits no discharge. Left eye exhibits no discharge.  Neck: Normal range of motion. Neck supple. No thyromegaly present.  Cardiovascular: Normal rate, regular rhythm, normal heart sounds and  intact distal pulses.   No murmur heard. Pulmonary/Chest: Effort normal. No respiratory distress. He has no wheezes. He has rhonchi.  Abdominal: Soft. Bowel sounds are normal. He exhibits no distension. There is no tenderness.  Musculoskeletal: Normal range of motion. He exhibits no edema or tenderness.  Neurological: He is alert and oriented to person, place, and time.  Skin: Skin is warm and dry. No rash noted. No erythema.  Psychiatric: He has a normal mood and affect. His behavior is normal. Judgment and thought content normal.  Vitals reviewed.     BP 111/75 (BP Location: Right Arm, Patient Position: Sitting, Cuff Size: Normal)   Pulse 88   Temp (!) 97.1 F (36.2 C) (Oral)   Ht 5\' 10"  (1.778 m)   Wt 202 lb (91.6 kg)   SpO2 93%   BMI 28.98 kg/m      Assessment & Plan:  1. Pulmonary emphysema, unspecified emphysema type ([place]) - Fluticasone-Umeclidin-Vilant (TRELEGY ELLIPTA) 100-62.5-25 MCG/INH AEPB; Inhale 1 puff into the lungs daily.  Dispense: 1 each; Refill: 3 - fluticasone (FLONASE) 50 MCG/ACT nasal spray; Place 2 sprays into both nostrils daily.  Dispense: 16 g; Refill: 6 - predniSONE (DELTASONE) 20 MG tablet; Take 3 tabs daily for 1 week, then 2 tabs for 1 week, then 1 tab for one week  Dispense: 42 tablet; Refill: 0  2. Cough   Stop smoking  Force fluids Continue Mucinex Pt to stop Symbicort and start Trelegy, samples given Prednisone  RTO prn and keep chronic follow up    [name], FNP

## 2016-09-25 NOTE — Patient Instructions (Signed)
Chronic Obstructive Pulmonary Disease Chronic obstructive pulmonary disease (COPD) is a common lung condition in which airflow from the lungs is limited. COPD is a general term that can be used to describe many different lung problems that limit airflow, including both chronic bronchitis and emphysema. If you have COPD, your lung function will probably never return to normal, but there are measures you can take to improve lung function and make yourself feel better. What are the causes?  Smoking (common).  Exposure to secondhand smoke.  Genetic problems.  Chronic inflammatory lung diseases or recurrent infections. What are the signs or symptoms?  Shortness of breath, especially with physical activity.  Deep, persistent (chronic) cough with a large amount of thick mucus.  Wheezing.  Rapid breaths (tachypnea).  Gray or bluish discoloration (cyanosis) of the skin, especially in your fingers, toes, or lips.  Fatigue.  Weight loss.  Frequent infections or episodes when breathing symptoms become much worse (exacerbations).  Chest tightness. How is this diagnosed? Your health care provider will take a medical history and perform a physical examination to diagnose COPD. Additional tests for COPD may include:  Lung (pulmonary) function tests.  Chest X-ray.  CT scan.  Blood tests. How is this treated? Treatment for COPD may include:  Inhaler and nebulizer medicines. These help manage the symptoms of COPD and make your breathing more comfortable.  Supplemental oxygen. Supplemental oxygen is only helpful if you have a low oxygen level in your blood.  Exercise and physical activity. These are beneficial for nearly all people with COPD.  Lung surgery or transplant.  Nutrition therapy to gain weight, if you are underweight.  Pulmonary rehabilitation. This may involve working with a team of health care providers and specialists, such as respiratory, occupational, and physical  therapists. Follow these instructions at home:  Take all medicines (inhaled or pills) as directed by your health care provider.  Avoid over-the-counter medicines or cough syrups that dry up your airway (such as antihistamines) and slow down the elimination of secretions unless instructed otherwise by your health care provider.  If you are a smoker, the most important thing that you can do is stop smoking. Continuing to smoke will cause further lung damage and breathing trouble. Ask your health care provider for help with quitting smoking. He or she can direct you to community resources or hospitals that provide support.  Avoid exposure to irritants such as smoke, chemicals, and fumes that aggravate your breathing.  Use oxygen therapy and pulmonary rehabilitation if directed by your health care provider. If you require home oxygen therapy, ask your health care provider whether you should purchase a pulse oximeter to measure your oxygen level at home.  Avoid contact with individuals who have a contagious illness.  Avoid extreme temperature and humidity changes.  Eat healthy foods. Eating smaller, more frequent meals and resting before meals may help you maintain your strength.  Stay active, but balance activity with periods of rest. Exercise and physical activity will help you maintain your ability to do things you want to do.  Preventing infection and hospitalization is very important when you have COPD. Make sure to receive all the vaccines your health care provider recommends, especially the pneumococcal and influenza vaccines. Ask your health care provider whether you need a pneumonia vaccine.  Learn and use relaxation techniques to manage stress.  Learn and use controlled breathing techniques as directed by your health care provider. Controlled breathing techniques include: 1. Pursed lip breathing. Start by breathing in (inhaling)   through your nose for 1 second. Then, purse your lips as  if you were going to whistle and breathe out (exhale) through the pursed lips for 2 seconds. 2. Diaphragmatic breathing. Start by putting one hand on your abdomen just above your waist. Inhale slowly through your nose. The hand on your abdomen should move out. Then purse your lips and exhale slowly. You should be able to feel the hand on your abdomen moving in as you exhale.  Learn and use controlled coughing to clear mucus from your lungs. Controlled coughing is a series of short, progressive coughs. The steps of controlled coughing are: 1. Lean your head slightly forward. 2. Breathe in deeply using diaphragmatic breathing. 3. Try to hold your breath for 3 seconds. 4. Keep your mouth slightly open while coughing twice. 5. Spit any mucus out into a tissue. 6. Rest and repeat the steps once or twice as needed. Contact a health care provider if:  You are coughing up more mucus than usual.  There is a change in the color or thickness of your mucus.  Your breathing is more labored than usual.  Your breathing is faster than usual. Get help right away if:  You have shortness of breath while you are resting.  You have shortness of breath that prevents you from:  Being able to talk.  Performing your usual physical activities.  You have chest pain lasting longer than 5 minutes.  Your skin color is more cyanotic than usual.  You measure low oxygen saturations for longer than 5 minutes with a pulse oximeter. This information is not intended to replace advice given to you by your health care provider. Make sure you discuss any questions you have with your health care provider. Document Released: 10/04/2004 Document Revised: 06/02/2015 Document Reviewed: 08/21/2012 Elsevier Interactive Patient Education  2017 Elsevier Inc.  

## 2016-10-12 ENCOUNTER — Encounter: Payer: Self-pay | Admitting: Family Medicine

## 2016-10-12 ENCOUNTER — Ambulatory Visit (INDEPENDENT_AMBULATORY_CARE_PROVIDER_SITE_OTHER): Payer: Medicare Other | Admitting: Family Medicine

## 2016-10-12 VITALS — BP 125/83 | HR 73 | Temp 98.7°F | Ht 70.0 in | Wt 198.0 lb

## 2016-10-12 DIAGNOSIS — J439 Emphysema, unspecified: Secondary | ICD-10-CM

## 2016-10-12 DIAGNOSIS — Z720 Tobacco use: Secondary | ICD-10-CM | POA: Diagnosis not present

## 2016-10-12 DIAGNOSIS — R053 Chronic cough: Secondary | ICD-10-CM

## 2016-10-12 DIAGNOSIS — R05 Cough: Secondary | ICD-10-CM | POA: Diagnosis not present

## 2016-10-12 LAB — CBC WITH DIFFERENTIAL/PLATELET
BASOS ABS: 0.1 10*3/uL (ref 0.0–0.2)
Basos: 1 %
EOS (ABSOLUTE): 0.1 10*3/uL (ref 0.0–0.4)
EOS: 1 %
HEMATOCRIT: 46.7 % (ref 37.5–51.0)
HEMOGLOBIN: 16.5 g/dL (ref 13.0–17.7)
Immature Grans (Abs): 0.3 10*3/uL — ABNORMAL HIGH (ref 0.0–0.1)
Immature Granulocytes: 3 %
LYMPHS ABS: 2.1 10*3/uL (ref 0.7–3.1)
Lymphs: 20 %
MCH: 31 pg (ref 26.6–33.0)
MCHC: 35.3 g/dL (ref 31.5–35.7)
MCV: 88 fL (ref 79–97)
MONOCYTES: 7 %
Monocytes Absolute: 0.8 10*3/uL (ref 0.1–0.9)
NEUTROS ABS: 7.4 10*3/uL — AB (ref 1.4–7.0)
Neutrophils: 68 %
Platelets: 279 10*3/uL (ref 150–379)
RBC: 5.33 x10E6/uL (ref 4.14–5.80)
RDW: 12.8 % (ref 12.3–15.4)
WBC: 10.7 10*3/uL (ref 3.4–10.8)

## 2016-10-12 MED ORDER — ALBUTEROL SULFATE HFA 108 (90 BASE) MCG/ACT IN AERS: 2.0000 | INHALATION_SPRAY | Freq: Four times a day (QID) | RESPIRATORY_TRACT | 0 refills | Status: DC | PRN

## 2016-10-12 MED ORDER — IPRATROPIUM-ALBUTEROL 0.5-2.5 (3) MG/3ML IN SOLN
3.0000 mL | Freq: Once | RESPIRATORY_TRACT | Status: AC
  Administered 2016-10-12: 3 mL via RESPIRATORY_TRACT

## 2016-10-12 NOTE — Patient Instructions (Signed)
As we discussed, I have ordered blood labs and a chest x-ray for further evaluation of your symptoms. I've also ordered albuterol inhaler for you to use 2 puffs every 6 hours for the next 2 days. Then albuterol every 6 hours as needed for cough, shortness of breath or wheeze. You may need a referral to a pulmonologist for lung function testing and further management of your symptoms. I will place this order pending the studies I have ordered today. If you develop worsening shortness of breath, chest pain, lethargy, or cough up blood, please seek immediate medical attention in the emergency department   Cough, Adult Coughing is a reflex that clears your throat and your airways. Coughing helps to heal and protect your lungs. It is normal to cough occasionally, but a cough that happens with other symptoms or lasts a long time may be a sign of a condition that needs treatment. A cough may last only 2-3 weeks (acute), or it may last longer than 8 weeks (chronic). What are the causes? Coughing is commonly caused by:  Breathing in substances that irritate your lungs.  A viral or bacterial respiratory infection.  Allergies.  Asthma.  Postnasal drip.  Smoking.  Acid backing up from the stomach into the esophagus (gastroesophageal reflux).  Certain medicines.  Chronic lung problems, including COPD (or rarely, lung cancer).  Other medical conditions such as heart failure.  Follow these instructions at home: Pay attention to any changes in your symptoms. Take these actions to help with your discomfort:  Take medicines only as told by your health care provider. ? If you were prescribed an antibiotic medicine, take it as told by your health care provider. Do not stop taking the antibiotic even if you start to feel better. ? Talk with your health care provider before you take a cough suppressant medicine.  Drink enough fluid to keep your urine clear or pale yellow.  If the air is dry, use a  cold steam vaporizer or humidifier in your bedroom or your home to help loosen secretions.  Avoid anything that causes you to cough at work or at home.  If your cough is worse at night, try sleeping in a semi-upright position.  Avoid cigarette smoke. If you smoke, quit smoking. If you need help quitting, ask your health care provider.  Avoid caffeine.  Avoid alcohol.  Rest as needed.  Contact a health care provider if:  You have new symptoms.  You cough up pus.  Your cough does not get better after 2-3 weeks, or your cough gets worse.  You cannot control your cough with suppressant medicines and you are losing sleep.  You develop pain that is getting worse or pain that is not controlled with pain medicines.  You have a fever.  You have unexplained weight loss.  You have night sweats. Get help right away if:  You cough up blood.  You have difficulty breathing.  Your heartbeat is very fast. This information is not intended to replace advice given to you by your health care provider. Make sure you discuss any questions you have with your health care provider. Document Released: 06/23/2010 Document Revised: 06/02/2015 Document Reviewed: 03/03/2014 Elsevier Interactive Patient Education  2017 [employer].

## 2016-10-12 NOTE — Progress Notes (Signed)
Subjective: XT:[id number] PCP: Sharion Balloon, FNP BDZ:HGDJME Jerilynn Mages Eduardo Honor. is a 66 y.o. male presenting to clinic today for:  1. Cough Patient was seen on 09/06/2016 and 09/25/2016 for same. He was initially prescribed doxycycline and and prednisone taper. The subsequent visit he was prescribed another steroid taper and Trelegy inhaler. He notes that after about 15 doses of the inhaler, he discontinued because he was not noticing any difference. He reports that the cough seems to be gradually getting worse and has not improved with any therapies that he's been provided. He denies hemoptysis. He does report feeling short of breath regardless of exertion. No chest pain, diaphoresis, nausea, vomiting. He does report both night and day sweats over the last couple of weeks. He notes that he still is and intermittent smoker, smoking maybe 4 cigarettes per day. He notes though that he used to be a very heavy smoker. He is never seen a pulmonologist or had pulmonary function testing.  He had a CT scan done in November 2016 that showed COPD with emphysema. Multiple small lung nodules bilaterally were also seen. These were stable from previous evaluations. There was no evidence of malignancy at that time.  Chest x-ray from May 2018 with no active cardiopulmonary disease.  Allergies  Allergen Reactions  . Flomax [Tamsulosin Hcl] Other (See Comments)    This medication makes patient feel sick  . Penicillins Other (See Comments)    Reaction unknown occurred during childhood Has patient had a PCN reaction causing immediate rash, facial/tongue/throat swelling, SOB or lightheadedness with hypotension: unsure - childhood reaction Has patient had a PCN reaction causing severe rash involving mucus membranes or skin necrosis: unsure - childhood reaction Has patient had a PCN reaction that required hospitalization no Has patient had a PCN reaction occurring within the last 10 years: no If all of the above answers are  "NO", then may p   Past Medical History:  Diagnosis Date  . Anxiety   . Arthritis   . Bipolar disorder ([place])   . CAD (coronary artery disease)    a. INF STEMI 07/04/10:  tx with thrombectomy + Vision BMS to [facility];  cath 07/04/10: dLM 10-20%, pLAD 40-50%, mLAD 20-30%, pRCA 30%, mRCA occluded and tx with PCI, EF 50% with inf HK. A Multilink  . COPD (chronic obstructive pulmonary disease) ([place])   . Depression   . Glucose intolerance (impaired glucose tolerance)    A1c 6.2 06/2010  . History of DVT (deep vein thrombosis)    traumatic, s/p coumadin tx.  Marland Kitchen HLD (hyperlipidemia)   . Hypertension    pt states is currently on no medications  . Myocardial infarction ([place])    around 2013  . Shortness of breath    walking distance or climbing stairs  . Tobacco abuse   . Urinary frequency    Family History  Problem Relation Age of Onset  . Coronary artery disease Father   . Depression Father   . Depression Mother    Social Hx: active daily smoker.Current medications reviewed.   ROS: Per HPI  Objective: Office vital signs reviewed. BP 125/83   Pulse 73   Temp 98.7 F (37.1 C) (Oral)   Ht 5\' 10"  (1.778 m)   Wt 198 lb (89.8 kg)   SpO2 92%   BMI 28.41 kg/m   Physical Examination:  General: Awake, alert, nontoxic appearing, No acute distress HEENT: Normal    Neck: No masses palpated. No lymphadenopathy; no JVD.    Ears:  Tympanic membranes intact, normal light reflex, no erythema, no bulging    Eyes: PERRLA, extraocular membranes intact, sclera white    Nose: nasal turbinates moist, no nasal discharge    Throat: moist mucus membranes, no erythema, no tonsillar exudate.  Airway is patent Cardio: regular rate and rhythm, S1S2 heard, no murmurs appreciated Pulm: Right upper and lower lung fields with diffuse inspiratory and expiratory rhonchi/wheeze; left lung fields with mild expiratory wheeze. normal work of breathing on room air Ext: No lower extremity edema.  Assessment/ Plan: 66  y.o. male   1. Persistent cough for 3 weeks or longer Vital signs stable, patient is afebrile. His oxygen saturation on room air is 92%. He has normal respiratory rate. I am somewhat concerned for possible malignancy given his persistent cough and baseline exam. He was refractory to treatment for COPD exacerbation. He was given a DuoNeb in office today with no improvement in his oxygen saturation, minimal improvement in his lung exam and subjectively did not feel a great deal better after medication. I reviewed the chest x-ray report from May of this year and the CAT scan of his chest report from November 2016. He did have multiple nodules and no evidence of malignancy on the CT scan. For now, we'll obtain a chest x-ray. I did offer to place this so that he may get it done today at [facility]. Patient declined this and prefers to wait until Monday to have this done here. I suspect that he will likely need a repeat CT chest, if not for follow-up on the multiple nodules, for lung screening since he is an active smoker. Additionally, would recommend a one-time abdominal ultrasound to evaluate for aortic aneurysm in the setting of history of smoking as per screening guidelines.  CBC with differential ordered. Strict return precautions and reasons for emergent evaluation in the emergency department or reviewed with the patient. He voiced good understanding. - DG Chest 2 View; Future - ipratropium-albuterol (DUONEB) 0.5-2.5 (3) MG/3ML nebulizer solution 3 mL; Take 3 mLs by nebulization once. - CBC with Differential  2. Pulmonary emphysema, unspecified emphysema type ([place]) - DG Chest 2 View; Future - ipratropium-albuterol (DUONEB) 0.5-2.5 (3) MG/3ML nebulizer solution 3 mL; Take 3 mLs by nebulization once. - CBC with Differential  3. Tobacco abuse Cessation counseling performed today. Patient is contemplative. - DG Chest 2 View; Future - CBC with Differential   Orders Placed This Encounter  Procedures    . DG Chest 2 View    Standing Status:   Future    Standing Expiration Date:   12/12/2017    Order Specific Question:   Reason for Exam (SYMPTOM  OR DIAGNOSIS REQUIRED)    Answer:   cough x4 weeks refractory to abx and oral steroids. h/o tobacco use/ COPD    Order Specific Question:   Preferred imaging location?    Answer:   Internal    Order Specific Question:   Radiology Contrast Protocol - do NOT remove file path    Answer:   [email or web address]  . CBC with Differential   Meds ordered this encounter  Medications  . ipratropium-albuterol (DUONEB) 0.5-2.5 (3) MG/3ML nebulizer solution 3 mL  . albuterol (PROVENTIL HFA;VENTOLIN HFA) 108 (90 Base) MCG/ACT inhaler    Sig: Inhale 2 puffs into the lungs every 6 (six) hours as needed for wheezing or shortness of breath.    Dispense:  1 Inhaler    Refill:  0  . ipratropium-albuterol (DUONEB) 0.5-2.5 (3) MG/3ML nebulizer solution 3  mL     Janora Norlander, DO [place] [phone]

## 2016-10-15 ENCOUNTER — Other Ambulatory Visit (INDEPENDENT_AMBULATORY_CARE_PROVIDER_SITE_OTHER): Payer: Medicare Other

## 2016-10-15 DIAGNOSIS — R053 Chronic cough: Secondary | ICD-10-CM

## 2016-10-15 DIAGNOSIS — R05 Cough: Secondary | ICD-10-CM | POA: Diagnosis not present

## 2016-10-15 DIAGNOSIS — J439 Emphysema, unspecified: Secondary | ICD-10-CM

## 2016-10-15 DIAGNOSIS — Z720 Tobacco use: Secondary | ICD-10-CM

## 2016-10-17 ENCOUNTER — Telehealth: Payer: Self-pay | Admitting: Family Medicine

## 2016-10-17 NOTE — Telephone Encounter (Signed)
Spoke with pt to let him know it has not been read yet. [facility] radiology to make sure this gets read.

## 2016-10-19 ENCOUNTER — Encounter: Payer: Self-pay | Admitting: Family Medicine

## 2016-10-19 DIAGNOSIS — I700 Atherosclerosis of aorta: Secondary | ICD-10-CM | POA: Insufficient documentation

## 2016-10-29 ENCOUNTER — Encounter: Payer: Self-pay | Admitting: Family Medicine

## 2016-10-29 ENCOUNTER — Ambulatory Visit (INDEPENDENT_AMBULATORY_CARE_PROVIDER_SITE_OTHER): Payer: Medicare Other | Admitting: Family Medicine

## 2016-10-29 VITALS — BP 127/85 | HR 94 | Temp 97.3°F | Ht 70.0 in | Wt 202.0 lb

## 2016-10-29 DIAGNOSIS — F329 Major depressive disorder, single episode, unspecified: Secondary | ICD-10-CM

## 2016-10-29 DIAGNOSIS — F32A Depression, unspecified: Secondary | ICD-10-CM

## 2016-10-29 MED ORDER — DULOXETINE HCL 30 MG PO CPEP: 30.0000 mg | ORAL_CAPSULE | Freq: Every day | ORAL | 0 refills | Status: DC

## 2016-10-29 NOTE — Patient Instructions (Signed)
I have prescribed you Cymbalta for your depression and generalized body pain.  This may help you get off of tramadol completely for body pain.  There is a risk of possible serotonin syndrome with tramadol and Cymbalta together.  If you develop any signs or symptoms of serotonin syndrome I would like you to immediately discontinue the Cymbalta.  Otherwise, plan to follow-up in 2-4 weeks for recheck.  Taking the medicine as directed and not missing any doses is one of the best things you can do to treat your depression.  Here are some things to keep in mind:  1) Side effects (stomach upset, some increased anxiety) may happen before you notice a benefit.  These side effects typically go away over time. 2) Changes to your dose of medicine or a change in medication all together is sometimes necessary 3) Most people need to be on medication at least 12 months 4) Many people will notice an improvement within two weeks but the full effect of the medication can take up to 4-6 weeks 5) Stopping the medication when you start feeling better often results in a return of symptoms 6) Never discontinue your medication without contacting a health care professional first.  Some medications require gradual discontinuation/ taper and can make you sick if you stop them abruptly.  If your symptoms worsen or you have thoughts of suicide/homicide, PLEASE SEEK IMMEDIATE MEDICAL ATTENTION.  You may always call the National Suicide Hotline.  This is available 24 hours a day, 7 days a week.  Their phone number is: [phone])[phone] Serotonin Syndrome Serotonin is a brain chemical that regulates the nervous system, which includes the brain, spinal cord, and nerves. Serotonin appears to play a role in all types of behavior, including appetite, emotions, movement, thinking, and response to stress. Excessively high levels of serotonin in the body can cause serotonin syndrome, which is a very dangerous condition. What are the  causes? This condition can be caused by taking medicines or drugs that increase the level of serotonin in your body. These include:  Antidepressant medicines.  Migraine medicines.  Certain pain medicines.  Certain recreational drugs, including ecstasy, LSD, cocaine, and amphetamines.  Over-the-counter cough or cold medicines that contain dextromethorphan.  Certain herbal supplements, including St. John's wort, ginseng, and nutmeg.  This condition usually occurs when you take these medicines or drugs in combination, but it can also happen with a high dose of a single medicine or drug. What increases the risk? This condition is more likely to develop in:  People who have recently increased the dosage of medicine that increases the serotonin level.  People who just started taking medicine that increases the serotonin level.  What are the signs or symptoms? Symptoms of this condition usually happens within several hours of a medicine change. Symptoms include:  Headache.  Muscle twitching or stiffness.  Diarrhea.  Confusion.  Restlessness or agitation.  Shivering or goose bumps.  Loss of muscle coordination.  Rapid heart rate.  Sweating.  Severe cases of serotonin syndromecan cause:  Irregular heartbeat.  Seizures.  Loss of consciousness.  High fever.  How is this diagnosed? This condition is diagnosed with a medical history and physical exam. You will be asked aboutyour symptoms and your use of medicines and recreational drugs. Your health care provider may also order lab work or additional tests to rule out other causes of your symptoms. How is this treated? The treatment for this condition depends on the severity of your symptoms. For mild cases,  stopping the medicine that caused your condition is usually all that is needed. For moderate to severe cases, hospitalization is required to monitor you and to prevent further muscle damage. Follow these instructions  at home:  Take over-the-counter and prescription medicines only as told by your health care provider. This is important.  Check with your health care provider before you start taking any new prescriptions, over-the-counter medicines, herbs, or supplements.  Avoid combining any medicines that can cause this condition to occur.  Keep all follow-up visits as told by your health care provider.This is important.  Maintain a healthy lifestyle. ? Eat healthy foods. ? Get plenty of sleep. ? Exercise regularly. ? Do not drink alcohol. ? Do not use recreational drugs. Contact a health care provider if:  Medicines do not seem to be helping.  Your symptoms do not improve or they get worse.  You have trouble taking care of yourself. Get help right away if:  You have worsening confusion, severe headache, chest pain, high fever, seizures, or loss of consciousness.  You have serious thoughts about hurting yourself or others.  You experience serious side effects of medicine, such as swelling of your face, lips, tongue, or throat. This information is not intended to replace advice given to you by your health care provider. Make sure you discuss any questions you have with your health care provider. Document Released: 02/02/2004 Document Revised: 08/20/2015 Document Reviewed: 01/07/2014 Elsevier Interactive Patient Education  [employer].

## 2016-10-29 NOTE — Progress Notes (Signed)
Subjective: Anthony [name] poorly PCP: Sharion Balloon, FNP [id number] Anthony [name]. is a 66 y.o. male presenting to clinic today for:  1. Feeling poorly Patient reports that he has had just generalized aches and pains and depressive symptoms.  He does have a history of depression diagnosed around 21 some years old.  He reports he has been on "everything".  He notes that Wellbutrin worked okay but prescribed for smoking cessation and not for depression.  He notes that his daughter rearranged his medications and he has not had this in several months.  Denies suicidal or homicidal ideation.  Endorses poor concentration, difficulty with sleep, feeling sadder than normal, especially in the setting of recent illness.  He reports that generalized aches and pains are most prominent in the shoulders and upper extremities.  He denies numbness or tingling.  No weakness.  No known injury.  He has never had imaging of his neck.  He has been taking naproxen daily for this.  When he has breakthrough pain he will take a tramadol.  He cites that he never takes this more than 2-3 times per week and often only 1 tablet.  He does report good relief with the tramadol but he wants to know why he is having the symptoms.  Denies fevers, chills.  Cough has improved substantially with inhaler.  No shortness of breath or chest pain.  No unplanned weight loss.  Allergies  Allergen Reactions  . Flomax [Tamsulosin Hcl] Other (See Comments)    This medication makes patient feel sick  . Penicillins Other (See Comments)    Reaction unknown occurred during childhood Has patient had a PCN reaction causing immediate rash, facial/tongue/throat swelling, SOB or lightheadedness with hypotension: unsure - childhood reaction Has patient had a PCN reaction causing severe rash involving mucus membranes or skin necrosis: unsure - childhood reaction Has patient had a PCN reaction that required hospitalization no Has patient had a PCN  reaction occurring within the last 10 years: no If all of the above answers are "NO", then may p   Past Medical History:  Diagnosis Date  . Anxiety   . Arthritis   . Bipolar disorder ([place])   . CAD (coronary artery disease)    a. INF STEMI 07/04/10:  tx with thrombectomy + Vision BMS to [facility];  cath 07/04/10: dLM 10-20%, pLAD 40-50%, mLAD 20-30%, pRCA 30%, mRCA occluded and tx with PCI, EF 50% with inf HK. A Multilink  . COPD (chronic obstructive pulmonary disease) ([place])   . Depression   . Glucose intolerance (impaired glucose tolerance)    A1c 6.2 06/2010  . History of DVT (deep vein thrombosis)    traumatic, s/p coumadin tx.  Marland Kitchen HLD (hyperlipidemia)   . Hypertension    pt states is currently on no medications  . Myocardial infarction ([place])    around 2013  . Shortness of breath    walking distance or climbing stairs  . Tobacco abuse   . Urinary frequency    Family History  Problem Relation Age of Onset  . Coronary artery disease Father   . Depression Father   . Depression Mother     Current Outpatient Prescriptions:  .  5-Hydroxytryptophan (5-HTP PO), Take 1 tablet by mouth at bedtime., Disp: , Rfl:  .  albuterol (PROVENTIL HFA;VENTOLIN HFA) 108 (90 Base) MCG/ACT inhaler, Inhale 2 puffs into the lungs every 6 (six) hours as needed for wheezing or shortness of breath., Disp: 1 Inhaler, Rfl: 0 .  blood  glucose meter kit and supplies KIT, Dispense based on patient and insurance preference. Use to check BG once daily or every other other.  Dx: E11.9, Disp: 1 each, Rfl: 0 .  Cyanocobalamin (VITAMIN B-12 PO), Take 1,000 mg by mouth daily as needed., Disp: , Rfl:  .  DULoxetine (CYMBALTA) 30 MG capsule, Take 1 capsule (30 mg total) by mouth daily., Disp: 30 capsule, Rfl: 0 .  fluticasone (FLONASE) 50 MCG/ACT nasal spray, Place 2 sprays into both nostrils daily., Disp: 16 g, Rfl: 6 .  Fluticasone-Umeclidin-Vilant (TRELEGY ELLIPTA) 100-62.5-25 MCG/INH AEPB, Inhale 1 puff into the lungs  daily., Disp: 1 each, Rfl: 3 .  MELATONIN PO, Take 2 tablets by mouth at bedtime as needed (sleep)., Disp: , Rfl:  .  naproxen sodium (ANAPROX) 220 MG tablet, Take 220 mg by mouth 2 (two) times daily as needed (pain). , Disp: , Rfl:  .  sennosides-docusate sodium (SENOKOT-S) 8.6-50 MG tablet, Take 1 tablet by mouth daily as needed for constipation., Disp: , Rfl:  .  traMADol (ULTRAM) 50 MG tablet, Take 1 tablet (50 mg total) by mouth every 12 (twelve) hours as needed for moderate pain or severe pain., Disp: 30 tablet, Rfl: 2 .  VALERIAN ROOT PO, Take 1 capsule by mouth daily as needed (sleep)., Disp: , Rfl:   Social Hx: Daily heavy smoker.  Health Maintenance: flu shot  ROS: Per HPI  Objective: Office vital signs reviewed. BP 127/85   Pulse 94   Temp (!) 97.3 F (36.3 C) (Oral)   Ht 5' 10" (1.778 m)   Wt 202 lb (91.6 kg)   BMI 28.98 kg/m   Physical Examination:  General: Awake, alert, well-nourished, disheveled, No acute distress Pulm: normal work of breathing on room air; no cough MSK: Normal gait and normal station; has full active range of motion Psych: Mood stable, speech normal, eye contact fair.  Does not appear to be responding to internal stimuli.  Does seem somewhat distracted.  Depression screen [facility] 2/9 10/29/2016 10/12/2016 09/06/2016  Decreased Interest 1 1 0  Down, Depressed, Hopeless _0 PHQ - 2 Score _1 Altered sleeping 1 1 -  Tired, decreased energy 2 2 -  Change in appetite 2 2 -  Feeling bad or failure about yourself  2 2 -  Trouble concentrating 2 1 -  Moving slowly or fidgety/restless 0 0 -  Suicidal thoughts 1 2 -  PHQ-9 Score 12 12 -  Difficult doing work/chores - Somewhat difficult -  Some recent data might be hidden    Assessment/ Plan: 66 y.o. male   1. Depression, unspecified depression type PHQ 9 score unchanged from 10/12/2016 check.  He is scoring a 12.  We discussed that his physical symptoms may be manifestations of depression.   Pain that radiates into the shoulders and upper extremities could be from degenerative changes in the C-spine.  An x-ray was offered today to the patient but he declined this.  We reviewed his last CBC which did not show an elevation in his white count.  Generalized aches and pains could be the result of a viral infection.  Given the lack of fever and other vital sign abnormalities, I have a low suspicion of influenza or bacterial infection.  We discussed options of restarting Wellbutrin and titrating up as needed versus starting a new medication for depression.  Cymbalta works well for patients with fibromyalgia pain and may be of use to him as well.  He takes tramadol very rarely and I think that the risk for serotonin syndrome in combination with Cymbalta would be low given this infrequent use.  We did review that this is a potential side effect and if he were to develop any signs or symptoms of serotonin syndrome he should immediately discontinue the medication.  This was also outlined in his AVS.    A coupon for medication was provided to help with copayment.  I recommend follow-up next 2-4 weeks with his primary care doctor. - DULoxetine (CYMBALTA) 30 MG capsule; Take 1 capsule (30 mg total) by mouth daily.  Dispense: 30 capsule; Refill: 0   No orders of the defined types were placed in this encounter.  Meds ordered this encounter  Medications  . DULoxetine (CYMBALTA) 30 MG capsule    Sig: Take 1 capsule (30 mg total) by mouth daily.    Dispense:  30 capsule    Refill:  [place], DO [place] [phone]

## 2016-11-01 ENCOUNTER — Telehealth: Payer: Self-pay | Admitting: Family

## 2016-11-01 NOTE — Telephone Encounter (Signed)
Patient know has hard knot on stomach came up right after he took his new medication gottschalk gave him offred appt. Pt wanted to speak nurse.

## 2016-11-01 NOTE — Telephone Encounter (Signed)
Left message for patient to call.

## 2016-11-08 NOTE — Telephone Encounter (Signed)
No response from patient. Note will be closed.

## 2016-11-09 ENCOUNTER — Encounter: Payer: Self-pay | Admitting: Family

## 2016-11-09 ENCOUNTER — Ambulatory Visit (INDEPENDENT_AMBULATORY_CARE_PROVIDER_SITE_OTHER): Payer: Medicare Other | Admitting: Family

## 2016-11-09 VITALS — BP 115/76 | HR 70 | Temp 96.8°F | Ht 70.0 in | Wt 196.0 lb

## 2016-11-09 DIAGNOSIS — J439 Emphysema, unspecified: Secondary | ICD-10-CM | POA: Diagnosis not present

## 2016-11-09 DIAGNOSIS — J410 Simple chronic bronchitis: Secondary | ICD-10-CM

## 2016-11-09 DIAGNOSIS — I2510 Atherosclerotic heart disease of native coronary artery without angina pectoris: Secondary | ICD-10-CM | POA: Diagnosis not present

## 2016-11-09 MED ORDER — ALBUTEROL SULFATE HFA 108 (90 BASE) MCG/ACT IN AERS: 2.0000 | INHALATION_SPRAY | Freq: Four times a day (QID) | RESPIRATORY_TRACT | 0 refills | Status: DC | PRN

## 2016-11-09 MED ORDER — FLUTICASONE-UMECLIDIN-VILANT 100-62.5-25 MCG/INH IN AEPB: 1.0000 | INHALATION_SPRAY | Freq: Every day | RESPIRATORY_TRACT | 3 refills | Status: DC

## 2016-11-09 NOTE — Patient Instructions (Signed)
Chronic Obstructive Pulmonary Disease Chronic obstructive pulmonary disease (COPD) is a common lung condition in which airflow from the lungs is limited. COPD is a general term that can be used to describe many different lung problems that limit airflow, including both chronic bronchitis and emphysema. If you have COPD, your lung function will probably never return to normal, but there are measures you can take to improve lung function and make yourself feel better. What are the causes?  Smoking (common).  Exposure to secondhand smoke.  Genetic problems.  Chronic inflammatory lung diseases or recurrent infections. What are the signs or symptoms?  Shortness of breath, especially with physical activity.  Deep, persistent (chronic) cough with a large amount of thick mucus.  Wheezing.  Rapid breaths (tachypnea).  Gray or bluish discoloration (cyanosis) of the skin, especially in your fingers, toes, or lips.  Fatigue.  Weight loss.  Frequent infections or episodes when breathing symptoms become much worse (exacerbations).  Chest tightness. How is this diagnosed? Your health care provider will take a medical history and perform a physical examination to diagnose COPD. Additional tests for COPD may include:  Lung (pulmonary) function tests.  Chest X-ray.  CT scan.  Blood tests. How is this treated? Treatment for COPD may include:  Inhaler and nebulizer medicines. These help manage the symptoms of COPD and make your breathing more comfortable.  Supplemental oxygen. Supplemental oxygen is only helpful if you have a low oxygen level in your blood.  Exercise and physical activity. These are beneficial for nearly all people with COPD.  Lung surgery or transplant.  Nutrition therapy to gain weight, if you are underweight.  Pulmonary rehabilitation. This may involve working with a team of health care providers and specialists, such as respiratory, occupational, and physical  therapists. Follow these instructions at home:  Take all medicines (inhaled or pills) as directed by your health care provider.  Avoid over-the-counter medicines or cough syrups that dry up your airway (such as antihistamines) and slow down the elimination of secretions unless instructed otherwise by your health care provider.  If you are a smoker, the most important thing that you can do is stop smoking. Continuing to smoke will cause further lung damage and breathing trouble. Ask your health care provider for help with quitting smoking. He or she can direct you to community resources or hospitals that provide support.  Avoid exposure to irritants such as smoke, chemicals, and fumes that aggravate your breathing.  Use oxygen therapy and pulmonary rehabilitation if directed by your health care provider. If you require home oxygen therapy, ask your health care provider whether you should purchase a pulse oximeter to measure your oxygen level at home.  Avoid contact with individuals who have a contagious illness.  Avoid extreme temperature and humidity changes.  Eat healthy foods. Eating smaller, more frequent meals and resting before meals may help you maintain your strength.  Stay active, but balance activity with periods of rest. Exercise and physical activity will help you maintain your ability to do things you want to do.  Preventing infection and hospitalization is very important when you have COPD. Make sure to receive all the vaccines your health care provider recommends, especially the pneumococcal and influenza vaccines. Ask your health care provider whether you need a pneumonia vaccine.  Learn and use relaxation techniques to manage stress.  Learn and use controlled breathing techniques as directed by your health care provider. Controlled breathing techniques include: 1. Pursed lip breathing. Start by breathing in (inhaling)   through your nose for 1 second. Then, purse your lips as  if you were going to whistle and breathe out (exhale) through the pursed lips for 2 seconds. 2. Diaphragmatic breathing. Start by putting one hand on your abdomen just above your waist. Inhale slowly through your nose. The hand on your abdomen should move out. Then purse your lips and exhale slowly. You should be able to feel the hand on your abdomen moving in as you exhale.  Learn and use controlled coughing to clear mucus from your lungs. Controlled coughing is a series of short, progressive coughs. The steps of controlled coughing are: 1. Lean your head slightly forward. 2. Breathe in deeply using diaphragmatic breathing. 3. Try to hold your breath for 3 seconds. 4. Keep your mouth slightly open while coughing twice. 5. Spit any mucus out into a tissue. 6. Rest and repeat the steps once or twice as needed. Contact a health care provider if:  You are coughing up more mucus than usual.  There is a change in the color or thickness of your mucus.  Your breathing is more labored than usual.  Your breathing is faster than usual. Get help right away if:  You have shortness of breath while you are resting.  You have shortness of breath that prevents you from:  Being able to talk.  Performing your usual physical activities.  You have chest pain lasting longer than 5 minutes.  Your skin color is more cyanotic than usual.  You measure low oxygen saturations for longer than 5 minutes with a pulse oximeter. This information is not intended to replace advice given to you by your health care provider. Make sure you discuss any questions you have with your health care provider. Document Released: 10/04/2004 Document Revised: 06/02/2015 Document Reviewed: 08/21/2012 Elsevier Interactive Patient Education  2017 Elsevier Inc.  

## 2016-11-09 NOTE — Progress Notes (Signed)
   Subjective:    Patient ID: Anthony Form., male    DOB: 13-Nov-1950, 66 y.o.   MRN: [id number]  Cough  This is a chronic problem. The current episode started more than 1 month ago. The problem has been rapidly improving. The problem occurs every few minutes. The cough is productive of sputum. Associated symptoms include chills, ear pain (left), a fever, headaches, myalgias and nasal congestion. Pertinent negatives include no ear congestion, sore throat, shortness of breath or wheezing. The symptoms are aggravated by lying down. Risk factors for lung disease include smoking/tobacco exposure. He has tried rest and OTC cough suppressant for the symptoms. The treatment provided mild relief. His past medical history is significant for COPD.      Review of Systems  Constitutional: Positive for chills and fever.  HENT: Positive for ear pain (left). Negative for sore throat.   Respiratory: Positive for cough. Negative for shortness of breath and wheezing.   Musculoskeletal: Positive for myalgias.  Neurological: Positive for headaches.  All other systems reviewed and are negative.      Objective:   Physical Exam  Constitutional: He is oriented to person, place, and time. He appears well-developed and well-nourished. No distress.  HENT:  Head: Normocephalic.  Right Ear: External ear normal.  Left Ear: External ear normal.  Mouth/Throat: Oropharynx is clear and moist.  Eyes: Pupils are equal, round, and reactive to light. Right eye exhibits no discharge. Left eye exhibits no discharge.  Neck: Normal range of motion. Neck supple. No thyromegaly present.  Cardiovascular: Normal rate, regular rhythm, normal heart sounds and intact distal pulses.   No murmur heard. Pulmonary/Chest: Effort normal. No respiratory distress. He has decreased breath sounds. He has no wheezes.  Abdominal: Soft. Bowel sounds are normal. He exhibits no distension. There is no tenderness.  Musculoskeletal: Normal  range of motion. He exhibits no edema or tenderness.  Neurological: He is alert and oriented to person, place, and time.  Skin: Skin is warm and dry. No rash noted. No erythema.  Psychiatric: He has a normal mood and affect. His behavior is normal. Judgment and thought content normal.  Vitals reviewed.   BP 115/76   Pulse 70   Temp (!) 96.8 F (36 C) (Oral)   Ht 5\' 10"  (1.778 m)   Wt 196 lb (88.9 kg)   SpO2 95%   BMI 28.12 kg/m      Assessment & Plan:  1. Simple chronic bronchitis ([place]) - Fluticasone-Umeclidin-Vilant (TRELEGY ELLIPTA) 100-62.5-25 MCG/INH AEPB; Inhale 1 puff into the lungs daily.  Dispense: 1 each; Refill: 3 - albuterol (PROVENTIL HFA;VENTOLIN HFA) 108 (90 Base) MCG/ACT inhaler; Inhale 2 puffs into the lungs every 6 (six) hours as needed for wheezing or shortness of breath.  Dispense: 1 Inhaler; Refill: 0  2. Pulmonary emphysema, unspecified emphysema type ([place]) - Fluticasone-Umeclidin-Vilant (TRELEGY ELLIPTA) 100-62.5-25 MCG/INH AEPB; Inhale 1 puff into the lungs daily.  Dispense: 1 each; Refill: 3 - albuterol (PROVENTIL HFA;VENTOLIN HFA) 108 (90 Base) MCG/ACT inhaler; Inhale 2 puffs into the lungs every 6 (six) hours as needed for wheezing or shortness of breath.  Dispense: 1 Inhaler; Refill: 0  Quit smoking two week ago- Keep up the great work! Restart Trelegy  Use albuterol only as needed Cough improved PT to call Cardiologists for follow up appt RTO in 2 months for follow up appt  [name], FNP

## 2016-11-22 ENCOUNTER — Other Ambulatory Visit: Payer: Self-pay | Admitting: Family

## 2016-11-22 ENCOUNTER — Other Ambulatory Visit: Payer: Self-pay | Admitting: Cardiovascular Disease

## 2016-11-22 NOTE — Telephone Encounter (Signed)
Follow Up:      Returning your call from today. 

## 2016-11-22 NOTE — Telephone Encounter (Signed)
I spoke with pt.  He had stopped several medications on his own and lost prescription. Lopressor was not stopped by another provider. He is going to resume lopressor but has no refills.  I told him I would send refill  to [facility] on [address].

## 2016-11-22 NOTE — Telephone Encounter (Signed)
Pt's pharmacy is requesting a refill on Metoprolol, I do not see where this medication was D/C by a provider, but medication is no longer on pt's medication list. Please advise

## 2016-11-22 NOTE — Telephone Encounter (Signed)
Medication was removed from list at office visit on 11/01/16.  I do not see when it was discontinued. I placed call to pt to see if he has been taking or if was discontinued by another provider. Left message to call back

## 2016-11-23 NOTE — Telephone Encounter (Signed)
LMTCB is pt still taking for smoking cessation.

## 2016-12-03 ENCOUNTER — Other Ambulatory Visit: Payer: Self-pay | Admitting: Family

## 2016-12-07 ENCOUNTER — Encounter (HOSPITAL_COMMUNITY): Payer: Self-pay

## 2016-12-07 ENCOUNTER — Other Ambulatory Visit: Payer: Self-pay

## 2016-12-07 ENCOUNTER — Telehealth: Payer: Self-pay | Admitting: Family

## 2016-12-07 ENCOUNTER — Emergency Department (HOSPITAL_COMMUNITY)
Admission: EM | Admit: 2016-12-07 | Discharge: 2016-12-07 | Disposition: A | Discharge status: Home / Self Care | Payer: Medicare Other | Attending: Emergency Medicine | Admitting: Emergency Medicine

## 2016-12-07 ENCOUNTER — Emergency Department (HOSPITAL_COMMUNITY): Payer: Medicare Other

## 2016-12-07 DIAGNOSIS — Y998 Other external cause status: Secondary | ICD-10-CM | POA: Diagnosis not present

## 2016-12-07 DIAGNOSIS — S0993XA Unspecified injury of face, initial encounter: Secondary | ICD-10-CM | POA: Diagnosis not present

## 2016-12-07 DIAGNOSIS — J449 Chronic obstructive pulmonary disease, unspecified: Secondary | ICD-10-CM | POA: Diagnosis not present

## 2016-12-07 DIAGNOSIS — Z87891 Personal history of nicotine dependence: Secondary | ICD-10-CM | POA: Insufficient documentation

## 2016-12-07 DIAGNOSIS — S0990XA Unspecified injury of head, initial encounter: Secondary | ICD-10-CM

## 2016-12-07 DIAGNOSIS — I252 Old myocardial infarction: Secondary | ICD-10-CM | POA: Insufficient documentation

## 2016-12-07 DIAGNOSIS — S80211A Abrasion, right knee, initial encounter: Secondary | ICD-10-CM | POA: Diagnosis not present

## 2016-12-07 DIAGNOSIS — Y9389 Activity, other specified: Secondary | ICD-10-CM | POA: Diagnosis not present

## 2016-12-07 DIAGNOSIS — I2510 Atherosclerotic heart disease of native coronary artery without angina pectoris: Secondary | ICD-10-CM | POA: Insufficient documentation

## 2016-12-07 DIAGNOSIS — W19XXXA Unspecified fall, initial encounter: Secondary | ICD-10-CM | POA: Insufficient documentation

## 2016-12-07 DIAGNOSIS — Y929 Unspecified place or not applicable: Secondary | ICD-10-CM | POA: Diagnosis not present

## 2016-12-07 DIAGNOSIS — Z23 Encounter for immunization: Secondary | ICD-10-CM | POA: Insufficient documentation

## 2016-12-07 DIAGNOSIS — I10 Essential (primary) hypertension: Secondary | ICD-10-CM | POA: Diagnosis not present

## 2016-12-07 DIAGNOSIS — S0083XA Contusion of other part of head, initial encounter: Secondary | ICD-10-CM | POA: Insufficient documentation

## 2016-12-07 DIAGNOSIS — S199XXA Unspecified injury of neck, initial encounter: Secondary | ICD-10-CM | POA: Diagnosis not present

## 2016-12-07 DIAGNOSIS — Z79899 Other long term (current) drug therapy: Secondary | ICD-10-CM | POA: Insufficient documentation

## 2016-12-07 MED ORDER — TETANUS-DIPHTH-ACELL PERTUSSIS 5-2.5-18.5 LF-MCG/0.5 IM SUSP
0.5000 mL | Freq: Once | INTRAMUSCULAR | Status: AC
  Administered 2016-12-07: 0.5 mL via INTRAMUSCULAR
  Filled 2016-12-07: qty 0.5

## 2016-12-07 NOTE — Discharge Instructions (Signed)
Wound care as we discussed.  CT head face and neck without any acute findings.  Tetanus updated.  Expect to be sore and stiff the next few days.  Return for any new or worse symptoms.

## 2016-12-07 NOTE — ED Provider Notes (Signed)
[facility] DEPT Provider Note   CSN: [id number] Arrival date & time: 12/07/16  1244     History   Chief Complaint Chief Complaint  Patient presents with  . Fall  . eye injury    HPI Anthony [name]. is a 66 y.o. male.  Patient stumbled and fell while playing with his grandchildren this morning at about 7 in the morning.  Patient fell to his right side landing on a rock bruising his right arm and the right side of his face.  Patient not on any blood thinners.  Denied any loss of consciousness.  Patient's not sure if his tetanus is up-to-date.  Also has some abrasions to right knee right calf area.  Denies any abdominal pain or chest pain shortness of breath back pain.      Past Medical History:  Diagnosis Date  . Anxiety   . Arthritis   . Bipolar disorder ([place])   . CAD (coronary artery disease)    a. INF STEMI 07/04/10:  tx with thrombectomy + [facility] to [facility];  cath 07/04/10: dLM 10-20%, pLAD 40-50%, mLAD 20-30%, pRCA 30%, mRCA occluded and tx with PCI, EF 50% with inf HK. A Multilink  . COPD (chronic obstructive pulmonary disease) ([place])   . Depression   . Glucose intolerance (impaired glucose tolerance)    A1c 6.2 06/2010  . History of DVT (deep vein thrombosis)    traumatic, s/p coumadin tx.  Marland Kitchen HLD (hyperlipidemia)   . Hypertension    pt states is currently on no medications  . Myocardial infarction ([place])    around 2013  . Shortness of breath    walking distance or climbing stairs  . Tobacco abuse   . Urinary frequency     Patient Active Problem List   Diagnosis Date Noted  . Aortic atherosclerosis ([place]) 10/19/2016  . Pain medication agreement signed 06/05/2016  . Opioid dependence ([place]) 06/05/2016  . Diabetes mellitus without complication ([place]) 62/95/2841  . Obesity (BMI 30-39.9) 04/13/2016  . Rectal bleeding 11/23/2015  . Moderate episode of recurrent major depressive disorder ([place]) 10/18/2015  . S/P right THA, AA  05/10/2015  . Recurrent left inguinal hernia 07/26/2014  . Chronic right hip pain 06/23/2014  . S/P bilateral inguinal hernia repair 10/30/2013  . Nodule of left lung 05/29/2013  . Bilateral inguinal hernia 05/28/2013  . Umbilical hernia 32/44/0102  . Major psychotic depression, recurrent ([place]) 12/16/2012  . Inadequate anticoagulation 04/29/2012  . Hypocalcemia 07/28/2010  . CAD (coronary artery disease)   . HLD (hyperlipidemia)   . COPD (chronic obstructive pulmonary disease) ([place])   . Tobacco abuse     Past Surgical History:  Procedure Laterality Date  . CARDIAC CATHETERIZATION    . COLONOSCOPY N/A 12/29/2015   Procedure: COLONOSCOPY;  Surgeon: Rogene Houston, MD;  Location: [facility];  Service: Endoscopy;  Laterality: N/A;  12:55  . ELECTROCARDIOGRAM     Showed inferolateral ST-elevation and a code STEMI was activated. In the ER, he was treated with morphine, herarin, and 600 mg of Plavix. He was transferred emergently to [facility].   . INGUINAL HERNIA REPAIR Left 07/26/2014   Procedure: OPEN REPAIR OF RECURENT LEFT INGUINAL HERNIA REPAIR WITH MESH;  Surgeon: Greer Pickerel, MD;  Location: [facility] ORS;  Service: General;  Laterality: Left;  . INSERTION OF MESH Bilateral 10/30/2013   Procedure: INSERTION OF MESH;  Surgeon: Gayland Curry, MD;  Location: [facility] ORS;  Service: General;  Laterality: Bilateral;  . LAPAROSCOPIC INGUINAL HERNIA WITH UMBILICAL HERNIA Bilateral 10/30/2013   Procedure: laparoscopic repair left pantaloom hernia with mesh, laparoscopic right inguinal hernia with mesh, OPEN REPAIR OF UMBILICAL HERNIA ;  Surgeon: Gayland Curry, MD;  Location: [facility];  Service: General;  Laterality: Bilateral;  . LAPAROSCOPY N/A 07/26/2014   Procedure: LAPAROSCOPY DIAGNOSTIC;  Surgeon: Greer Pickerel, MD;  Location: [facility];  Service: General;  Laterality: N/A;  . POLYPECTOMY  12/29/2015   Procedure: POLYPECTOMY;  Surgeon: Rogene Houston, MD;  Location: [facility];  Service:  Endoscopy;;  ascending colon, descending colon  . stent placement    . TOTAL HIP ARTHROPLASTY Right 05/10/2015   Procedure: RIGHT TOTAL HIP ARTHROPLASTY ANTERIOR APPROACH;  Surgeon: Paralee Cancel, MD;  Location: [facility];  Service: Orthopedics;  Laterality: Right;  Marland Kitchen VASECTOMY         Home Medications    Prior to Admission medications   Medication Sig Start Date End Date Taking? Authorizing Provider  5-Hydroxytryptophan (5-HTP PO) Take 1 tablet by mouth at bedtime.   Yes [provider]  albuterol (PROVENTIL HFA;VENTOLIN HFA) 108 (90 Base) MCG/ACT inhaler Inhale 2 puffs into the lungs every 6 (six) hours as needed for wheezing or shortness of breath. 11/09/16  Yes Hawks, Christy A, FNP  buPROPion (WELLBUTRIN SR) 150 MG 12 hr tablet TAKE 1 TABLET BY MOUTH TWICE DAILY. TO HELP WITH SMOKING CESSATION. 12/04/16  Yes Hawks, Christy A, FNP  Cyanocobalamin (VITAMIN B-12 PO) Take 1,000 mg by mouth at bedtime.    Yes [provider]  fluticasone (FLONASE) 50 MCG/ACT nasal spray Place 2 sprays into both nostrils daily. 09/25/16  Yes Hawks, Christy A, FNP  Fluticasone-Umeclidin-Vilant (TRELEGY ELLIPTA) 100-62.5-25 MCG/INH AEPB Inhale 1 puff into the lungs daily. 11/09/16  Yes Hawks, Christy A, FNP  MELATONIN PO Take 2 tablets by mouth at bedtime.    Yes [provider]  metoprolol tartrate (LOPRESSOR) 25 MG tablet TAKE ONE-HALF TABLET BY MOUTH TWICE DAILY Patient taking differently: TAKE ONE-HALF (12.5 MG) TABLET BY MOUTH TWICE DAILY 11/22/16  Yes Burnell Blanks, MD  naproxen sodium (ANAPROX) 220 MG tablet Take 220 mg by mouth 2 (two) times daily as needed (pain).    Yes [provider]  sennosides-docusate sodium (SENOKOT-S) 8.6-50 MG tablet Take 1 tablet by mouth 3 (three) times daily as needed for constipation.    Yes [provider]  traMADol (ULTRAM) 50 MG tablet Take 1 tablet (50 mg total) by mouth every 12 (twelve) hours as needed for moderate pain  or severe pain. 06/05/16  Yes Hawks, Christy A, FNP  VALERIAN ROOT PO Take 1 capsule by mouth at bedtime.    Yes [provider]  blood glucose meter kit and supplies KIT Dispense based on patient and insurance preference. Use to check BG once daily or every other other.  Dx: E11.9 Patient not taking: Reported on 11/09/2016 07/17/16   [name] A, FNP  DULoxetine (CYMBALTA) 30 MG capsule Take 1 capsule (30 mg total) by mouth daily. Patient not taking: Reported on 11/09/2016 10/29/16   Janora Norlander, DO    Family History Family History  Problem Relation Age of Onset  . Coronary artery disease Father   . Depression Father   . Depression Mother     Social History Social History   Tobacco Use  . Smoking status: Former Smoker    Packs/day: 0.50    Years: 45.00    Pack years: 22.50  Types: Cigarettes    Last attempt to quit: 10/26/2016    Years since quitting: 0.1  . Smokeless tobacco: Never Used  Substance Use Topics  . Alcohol use: No    Comment: past hx of ETOH use was in inpt facility per court order  - 20 years, 10-18-2015 per pt not anymore,per pt stopped about 15-20 yrs ago  . Drug use: No    Comment: snorted heroin and cocaine, 10-18-2015 per pt in the past he did Marijuana only and nothing else     Allergies   Flomax [tamsulosin hcl] and Penicillins   Review of Systems Review of Systems  Constitutional: Negative for fever.  HENT: Positive for facial swelling.   Eyes: Negative for visual disturbance.  Respiratory: Negative for shortness of breath.   Cardiovascular: Negative for chest pain.  Gastrointestinal: Negative for abdominal pain.  Genitourinary: Negative for dysuria.  Musculoskeletal: Negative for back pain and neck pain.  Skin: Positive for wound.  Neurological: Positive for headaches. Negative for syncope.  Hematological: Does not bruise/bleed easily.  Psychiatric/Behavioral: Negative for confusion.     Physical Exam Updated Vital  Signs BP (!) 141/91   Pulse 75   Temp 98.2 F (36.8 C) (Oral)   Resp 18   Ht 1.778 m (5' 10")   Wt 93 kg (205 lb)   SpO2 93%   BMI 29.41 kg/m   Physical Exam  Constitutional: He is oriented to person, place, and time. He appears well-developed and well-nourished. No distress.  HENT:  Mouth/Throat: Oropharynx is clear and moist.  Marked swelling and bruising below the right eye.  Cheek area.  Forehead with some abrasions.  Superficial abrasions to the cheek area.  Eyes: Conjunctivae and EOM are normal. Pupils are equal, round, and reactive to light.  No hyphema  Neck: Normal range of motion. Neck supple.  Cardiovascular: Normal rate, regular rhythm and normal heart sounds.  Pulmonary/Chest: Effort normal and breath sounds normal. No respiratory distress. He exhibits no tenderness.  Abdominal: Soft. Bowel sounds are normal. There is no tenderness.  Musculoskeletal: Normal range of motion.  Right knee calf shin with some superficial abrasions.  And some bruising.  No effusion good range of motion knee Not dislocated.  Also marked subcutaneous bruising to the right forearm.  Good range of motion of wrist fingers elbow and shoulder.  No deformity.  Radial pulse 2+.  Neurological: He is alert and oriented to person, place, and time. No cranial nerve deficit or sensory deficit. He exhibits normal muscle tone. Coordination normal.  Skin: Skin is warm.  Nursing note and vitals reviewed.    ED Treatments / Results  Labs (all labs ordered are listed, but only abnormal results are displayed) Labs Reviewed - No data to display  EKG  EKG Interpretation None       Radiology Ct Head Wo Contrast  Result Date: 12/07/2016 CLINICAL DATA:  Fall. EXAM: CT HEAD WITHOUT CONTRAST CT MAXILLOFACIAL WITHOUT CONTRAST CT CERVICAL SPINE WITHOUT CONTRAST TECHNIQUE: Multidetector CT imaging of the head, cervical spine, and maxillofacial structures were performed using the standard protocol without  intravenous contrast. Multiplanar CT image reconstructions of the cervical spine and maxillofacial structures were also generated. COMPARISON:  CT scan December 15, 2012 FINDINGS: CT HEAD FINDINGS Brain: No hemorrhage identified. Scattered white matter changes. No acute cortical ischemia or infarct. Ventricles and sulci are unremarkable. Cerebellum, brainstem, and basal cisterns are normal. No mass effect or midline shift. Vascular: No hyperdense vessel or unexpected calcification. Skull: Normal.  Negative for fracture or focal lesion. Other: Soft tissue swelling is seen in the periorbital region. The underlying globe is intact. Extracranial soft tissues CT MAXILLOFACIAL FINDINGS Osseous: No fracture or mandibular dislocation. No destructive process. Orbits: Negative. No traumatic or inflammatory finding. Sinuses: Clear. Soft tissues: Soft tissue swelling over the right orbit. The underlying globe is intact. Other soft tissues are normal. CT CERVICAL SPINE FINDINGS Alignment: Normal. Skull base and vertebrae: Probable blind end in the right-sided C2. No fractures identified. Soft tissues and spinal canal: No prevertebral fluid or swelling. No visible canal hematoma. Disc levels:  Multilevel degenerative changes. Upper chest: Negative. Other: No other abnormalities. IMPRESSION: 1. No acute intracranial abnormality identified. 2. Soft tissue swelling in the right periorbital region. The underlying globe is intact. 3. Degenerative changes in the cervical spine without fracture or malalignment. Electronically Signed   By: Dorise Bullion III M.D   On: 12/07/2016 19:15   Ct Cervical Spine Wo Contrast  Result Date: 12/07/2016 CLINICAL DATA:  Fall. EXAM: CT HEAD WITHOUT CONTRAST CT MAXILLOFACIAL WITHOUT CONTRAST CT CERVICAL SPINE WITHOUT CONTRAST TECHNIQUE: Multidetector CT imaging of the head, cervical spine, and maxillofacial structures were performed using the standard protocol without intravenous contrast.  Multiplanar CT image reconstructions of the cervical spine and maxillofacial structures were also generated. COMPARISON:  CT scan December 15, 2012 FINDINGS: CT HEAD FINDINGS Brain: No hemorrhage identified. Scattered white matter changes. No acute cortical ischemia or infarct. Ventricles and sulci are unremarkable. Cerebellum, brainstem, and basal cisterns are normal. No mass effect or midline shift. Vascular: No hyperdense vessel or unexpected calcification. Skull: Normal. Negative for fracture or focal lesion. Other: Soft tissue swelling is seen in the periorbital region. The underlying globe is intact. Extracranial soft tissues CT MAXILLOFACIAL FINDINGS Osseous: No fracture or mandibular dislocation. No destructive process. Orbits: Negative. No traumatic or inflammatory finding. Sinuses: Clear. Soft tissues: Soft tissue swelling over the right orbit. The underlying globe is intact. Other soft tissues are normal. CT CERVICAL SPINE FINDINGS Alignment: Normal. Skull base and vertebrae: Probable blind end in the right-sided C2. No fractures identified. Soft tissues and spinal canal: No prevertebral fluid or swelling. No visible canal hematoma. Disc levels:  Multilevel degenerative changes. Upper chest: Negative. Other: No other abnormalities. IMPRESSION: 1. No acute intracranial abnormality identified. 2. Soft tissue swelling in the right periorbital region. The underlying globe is intact. 3. Degenerative changes in the cervical spine without fracture or malalignment. Electronically Signed   By: Dorise Bullion III M.D   On: 12/07/2016 19:15   Ct Maxillofacial Wo Cm  Result Date: 12/07/2016 CLINICAL DATA:  Fall. EXAM: CT HEAD WITHOUT CONTRAST CT MAXILLOFACIAL WITHOUT CONTRAST CT CERVICAL SPINE WITHOUT CONTRAST TECHNIQUE: Multidetector CT imaging of the head, cervical spine, and maxillofacial structures were performed using the standard protocol without intravenous contrast. Multiplanar CT image reconstructions  of the cervical spine and maxillofacial structures were also generated. COMPARISON:  CT scan December 15, 2012 FINDINGS: CT HEAD FINDINGS Brain: No hemorrhage identified. Scattered white matter changes. No acute cortical ischemia or infarct. Ventricles and sulci are unremarkable. Cerebellum, brainstem, and basal cisterns are normal. No mass effect or midline shift. Vascular: No hyperdense vessel or unexpected calcification. Skull: Normal. Negative for fracture or focal lesion. Other: Soft tissue swelling is seen in the periorbital region. The underlying globe is intact. Extracranial soft tissues CT MAXILLOFACIAL FINDINGS Osseous: No fracture or mandibular dislocation. No destructive process. Orbits: Negative. No traumatic or inflammatory finding. Sinuses: Clear. Soft tissues: Soft tissue swelling over  the right orbit. The underlying globe is intact. Other soft tissues are normal. CT CERVICAL SPINE FINDINGS Alignment: Normal. Skull base and vertebrae: Probable blind end in the right-sided C2. No fractures identified. Soft tissues and spinal canal: No prevertebral fluid or swelling. No visible canal hematoma. Disc levels:  Multilevel degenerative changes. Upper chest: Negative. Other: No other abnormalities. IMPRESSION: 1. No acute intracranial abnormality identified. 2. Soft tissue swelling in the right periorbital region. The underlying globe is intact. 3. Degenerative changes in the cervical spine without fracture or malalignment. Electronically Signed   By: Dorise Bullion III M.D   On: 12/07/2016 19:15    Procedures Procedures (including critical care time)  Medications Ordered in ED Medications  Tdap (BOOSTRIX) injection 0.5 mL (0.5 mLs Intramuscular Given 12/07/16 1903)     Initial Impression / Assessment and Plan / ED Course  I have reviewed the triage vital signs and the nursing notes.  Pertinent labs & imaging results that were available during my care of the patient were reviewed by me and  considered in my medical decision making (see chart for details).    Workup for the fall.  Without any acute bony injuries.  CT head face neck negative.  Right forearm without concerns for any bony abnormalities.  Patient's tetanus updated.  Wound care described.  No lacerations requiring suturing or gluing.   Final Clinical Impressions(s) / ED Diagnoses   Final diagnoses:  Fall, initial encounter  Contusion of face, initial encounter  Injury of head, initial encounter    ED Discharge Orders    None       Fredia Sorrow, MD 12/07/16 2353

## 2016-12-07 NOTE — ED Notes (Signed)
ED Provider at bedside. 

## 2016-12-07 NOTE — ED Triage Notes (Addendum)
Patient reports that he was fooling around with grandchildren and lost his balance when he tried to avoid stepping on his grandchild, he fell and landed on a rock with his right face. Patient has a large hematoma to the right eye/face area. Patient has not had blood thinners for a 1 1/2 years. Patient denies LOC. Patient also has abrasions to the right forearm, right knee, right calf area.

## 2016-12-11 ENCOUNTER — Ambulatory Visit (INDEPENDENT_AMBULATORY_CARE_PROVIDER_SITE_OTHER): Payer: Medicare Other | Admitting: Family

## 2016-12-11 ENCOUNTER — Encounter: Payer: Self-pay | Admitting: Family

## 2016-12-11 VITALS — BP 121/81 | HR 62 | Temp 97.4°F | Ht 70.0 in | Wt 209.0 lb

## 2016-12-11 DIAGNOSIS — W19XXXA Unspecified fall, initial encounter: Secondary | ICD-10-CM | POA: Diagnosis not present

## 2016-12-11 DIAGNOSIS — I2510 Atherosclerotic heart disease of native coronary artery without angina pectoris: Secondary | ICD-10-CM

## 2016-12-11 DIAGNOSIS — Z09 Encounter for follow-up examination after completed treatment for conditions other than malignant neoplasm: Secondary | ICD-10-CM | POA: Diagnosis not present

## 2016-12-11 DIAGNOSIS — Y92009 Unspecified place in unspecified non-institutional (private) residence as the place of occurrence of the external cause: Principal | ICD-10-CM

## 2016-12-11 DIAGNOSIS — S0083XD Contusion of other part of head, subsequent encounter: Secondary | ICD-10-CM | POA: Diagnosis not present

## 2016-12-11 NOTE — Patient Instructions (Signed)

## 2016-12-11 NOTE — Progress Notes (Signed)
   Subjective:    Patient ID: Anthony Form., male    DOB: 23-Jun-1950, 66 y.o.   MRN: [id number]  HPI Pt presents to the office today for hospital follow up. PT fell on 12/07/16 while walking up his stairs and landed on his right  Side and his right face.  PT had negative CT head, CT maxillofacial, and CT cervical spine. Denies any changes in vision, gait, or speech.   States he has constant pain of 2 out 10.   Review of Systems  Musculoskeletal: Positive for arthralgias.  All other systems reviewed and are negative.      Objective:   Physical Exam  Constitutional: He is oriented to person, place, and time. He appears well-developed and well-nourished. No distress.  HENT:  Head: Normocephalic.  Eyes: Pupils are equal, round, and reactive to light. Right eye exhibits no discharge. Left eye exhibits no discharge.  Neck: Normal range of motion. Neck supple. No thyromegaly present.  Cardiovascular: Normal rate, regular rhythm, normal heart sounds and intact distal pulses.  No murmur heard. Pulmonary/Chest: Effort normal and breath sounds normal. No respiratory distress. He has no wheezes.  Abdominal: Soft. Bowel sounds are normal. He exhibits no distension. There is no tenderness.  Musculoskeletal: Normal range of motion. He exhibits no edema or tenderness.  Neurological: He is alert and oriented to person, place, and time.  Skin: Skin is warm and dry. Ecchymosis (on right maxiallary and right forearm) noted. No rash noted. No erythema.     Psychiatric: He has a normal mood and affect. His behavior is normal. Judgment and thought content normal.  Vitals reviewed.    BP 121/81   Pulse 62   Temp (!) 97.4 F (36.3 C) (Oral)   Ht 5\' 10"  (1.778 m)   Wt 209 lb (94.8 kg)   BMI 29.99 kg/m      Assessment & Plan:  1. Fall in home, initial encounter  2. Contusion of face, subsequent encounter  3. Hospital discharge follow-up  Fall prevention discussed Wounds healing and  improving RTO prn or if any changes in gait, speech, or vision    [name], FNP

## 2017-01-18 ENCOUNTER — Encounter: Payer: Self-pay | Admitting: Family Medicine

## 2017-01-18 ENCOUNTER — Ambulatory Visit (INDEPENDENT_AMBULATORY_CARE_PROVIDER_SITE_OTHER): Payer: Medicare HMO | Admitting: Family Medicine

## 2017-01-18 ENCOUNTER — Ambulatory Visit (INDEPENDENT_AMBULATORY_CARE_PROVIDER_SITE_OTHER): Payer: Medicare HMO

## 2017-01-18 VITALS — BP 125/89 | HR 78 | Temp 98.8°F | Ht 70.0 in | Wt 220.0 lb

## 2017-01-18 DIAGNOSIS — J410 Simple chronic bronchitis: Secondary | ICD-10-CM

## 2017-01-18 DIAGNOSIS — J441 Chronic obstructive pulmonary disease with (acute) exacerbation: Secondary | ICD-10-CM | POA: Diagnosis not present

## 2017-01-18 DIAGNOSIS — J439 Emphysema, unspecified: Secondary | ICD-10-CM

## 2017-01-18 DIAGNOSIS — J449 Chronic obstructive pulmonary disease, unspecified: Secondary | ICD-10-CM | POA: Diagnosis not present

## 2017-01-18 MED ORDER — DOXYCYCLINE HYCLATE 100 MG PO TABS: 100.0000 mg | ORAL_TABLET | Freq: Two times a day (BID) | ORAL | 0 refills | Status: DC

## 2017-01-18 MED ORDER — PREDNISONE 20 MG PO TABS: ORAL_TABLET | ORAL | 0 refills | Status: DC

## 2017-01-18 MED ORDER — ALBUTEROL SULFATE HFA 108 (90 BASE) MCG/ACT IN AERS: 2.0000 | INHALATION_SPRAY | Freq: Four times a day (QID) | RESPIRATORY_TRACT | 0 refills | Status: DC | PRN

## 2017-01-18 MED ORDER — FLUTICASONE-UMECLIDIN-VILANT 100-62.5-25 MCG/INH IN AEPB: 1.0000 | INHALATION_SPRAY | Freq: Every day | RESPIRATORY_TRACT | 3 refills | Status: DC

## 2017-01-18 NOTE — Progress Notes (Signed)
Subjective: HPI: Patient presents to clinic with a chronic cough and shortness of breath. He has been having breathing problems and a chronic cough since August 2018 where he was treated with multiple antibiotics and steroids with no relief. Patient has COPD and has a pack year of 60, but has relapsed with his smoking cessation and still smokes at least 1 cigarette daily. For the past 3 weeks, patient has been experiencing worsen of cough, sweats, weakness, and sleep issues which he attributes to a cold. He has been taking OTC cold medication and Flonase daily with no relief. He finds relief with albuterol which he is using about 3-4 times a day. His symptoms wax and wane throughout the week. Patient stated he felt all cured on Monday, but then felt extremely sick on Tuesday where he felt weak and had an increase to his cough. Over the past three weeks, the patient has been coughing up clear/grayish mucus. Patient denies chest pain, nausea, vomiting, sinus pain, and congestion.  ROS: General: (+) weakness, trouble sleeping, malaise, sweats, fatigue HEENT:  Head: (-)HA  Ears: (+) ear pressure in right ear (-) hearing loss, drainage, discharge  Throat: (+) sore throat (+) dysphagia, odynophagia Neck: (-) lumps, swollen glands, pain Pulmonary: (+) cough, dyspnea (-) wheezing, hemoptysis, painful respirations Cardiovascular: (-) chest pain, tightness, palpitations  Objective: Vitals: BP: 125/89 mmHg, regular; HR: 78 bpm, regular; Temperature: 98.8 F, regular; Weight: 220lb; Height 5'10"  PE: General: 67 y/o male, appears stated age, with no signs of acute distress. Patient is continuously coughing. AAO x3. HEENT:  Eyes: no conjunctival injection or discharge  Ears: external ears free of pain, lesions, and deformities with a clear canal. TM is pearly gray,  translucent with visible landmarks  Throat: Mucous membrane pink and moist. Mild erythema in throat Neck: trachea midline and mobile.  Non-palpable, normal consistency and sized thyroid. No palpable or tender lymph nodes Pulmonary: Clear to auscultation bilaterally, no wheezes, rales, or rhonchi noted. Cardiovascular: RRR, normal S1 and S2.  Assessment: Acute COPD exacerbation: Patient presented with classic signs and symptoms of a COPD exacerbation such as a chronic productive cough with clear/grayish mucus and dyspnea. His CXR was clear bilaterally with all lung markings present.   Plan: Patient is being started on Trelegy 100-62.5-25 1 puff daily, prednisone 20 mg tablet x 3 weeks, and doxycycline 100 mg tablet BID x 10 days for his COPD exacerbation. Patient has been instructed to call the clinic or go to the ER if he has worsening breathing problems. Patient has been educated on smoking cessation and the benefits it would have on his breathing and COPD. Patient was also educated on ADRs of his medication and all questions were answered. Patient was also instructed to continue his albuterol inhaler as needed.  Patient seen and examined with Lanna Poche PA student, agree with assessment and plan above. Caryl Pina, MD [facility] 01/22/2017, 2:31 PM

## 2017-02-15 ENCOUNTER — Ambulatory Visit (INDEPENDENT_AMBULATORY_CARE_PROVIDER_SITE_OTHER): Payer: Medicare HMO | Admitting: Family

## 2017-02-15 ENCOUNTER — Encounter: Payer: Self-pay | Admitting: Family

## 2017-02-15 VITALS — BP 120/80 | HR 71 | Temp 96.8°F | Ht 70.0 in | Wt 211.8 lb

## 2017-02-15 DIAGNOSIS — J439 Emphysema, unspecified: Secondary | ICD-10-CM | POA: Diagnosis not present

## 2017-02-15 DIAGNOSIS — F331 Major depressive disorder, recurrent, moderate: Secondary | ICD-10-CM | POA: Diagnosis not present

## 2017-02-15 DIAGNOSIS — E669 Obesity, unspecified: Secondary | ICD-10-CM

## 2017-02-15 DIAGNOSIS — Z720 Tobacco use: Secondary | ICD-10-CM | POA: Diagnosis not present

## 2017-02-15 DIAGNOSIS — E1169 Type 2 diabetes mellitus with other specified complication: Secondary | ICD-10-CM

## 2017-02-15 DIAGNOSIS — I2510 Atherosclerotic heart disease of native coronary artery without angina pectoris: Secondary | ICD-10-CM

## 2017-02-15 DIAGNOSIS — E785 Hyperlipidemia, unspecified: Secondary | ICD-10-CM | POA: Diagnosis not present

## 2017-02-15 DIAGNOSIS — E119 Type 2 diabetes mellitus without complications: Secondary | ICD-10-CM | POA: Diagnosis not present

## 2017-02-15 LAB — BAYER DCA HB A1C WAIVED: HB A1C: 7.6 % — AB (ref <7.0)

## 2017-02-15 NOTE — Progress Notes (Signed)
Subjective:    Patient ID: Anthony [name]., male    DOB: 1950/11/25, 67 y.o.   MRN: [id number]  Pt presents to the office today for chronic follow up.  PT does have COPD and has tried stop smoking, but has restarted over the last several days . PT denies any SOB or cough at this time. Pt has hx of pulmonary nodules.   PT has CAD and is followed by Cardiologists every 6 months. Diabetes  He presents for his follow-up diabetic visit. He has type 2 diabetes mellitus. His disease course has been stable. There are no hypoglycemic associated symptoms. Associated symptoms include foot paresthesias. Pertinent negatives for diabetes include no blurred vision and no visual change. There are no hypoglycemic complications. Symptoms are stable. Diabetic complications include heart disease. Pertinent negatives for diabetic complications include no peripheral neuropathy. Risk factors for coronary artery disease include dyslipidemia, hypertension, male sex, obesity and sedentary lifestyle. (Does not check BS at home ) Eye exam is current.  Hyperlipidemia  This is a chronic problem. The current episode started more than 1 year ago. The problem is controlled. Recent lipid tests were reviewed and are normal. Exacerbating diseases include obesity. Current antihyperlipidemic treatment includes statins. The current treatment provides moderate improvement of lipids. Risk factors for coronary artery disease include diabetes mellitus, dyslipidemia, male sex, hypertension, a sedentary lifestyle and family history.  Depression         This is a chronic problem.  The current episode started more than 1 year ago.   The onset quality is gradual.   The problem occurs intermittently.  The problem has been waxing and waning since onset.  Associated symptoms include irritable and sad.  Associated symptoms include no helplessness and no hopelessness.  Past treatments include SSRIs - Selective serotonin reuptake  inhibitors.     Review of Systems  Eyes: Negative for blurred vision.  Respiratory: Positive for cough.   Psychiatric/Behavioral: Positive for depression.  All other systems reviewed and are negative.      Objective:   Physical Exam  Constitutional: He is oriented to person, place, and time. He appears well-developed and well-nourished. He is irritable. No distress.  HENT:  Head: Normocephalic.  Right Ear: External ear normal.  Left Ear: External ear normal.  Mouth/Throat: Oropharynx is clear and moist.  Eyes: Pupils are equal, round, and reactive to light. Right eye exhibits no discharge. Left eye exhibits no discharge.  Neck: Normal range of motion. Neck supple. No thyromegaly present.  Cardiovascular: Normal rate, regular rhythm, normal heart sounds and intact distal pulses.  No murmur heard. Pulmonary/Chest: Effort normal. No respiratory distress. He has decreased breath sounds. He has no wheezes.  Abdominal: Soft. Bowel sounds are normal. He exhibits no distension. There is no tenderness.  Musculoskeletal: Normal range of motion. He exhibits edema (trace BLE). He exhibits no tenderness.  Neurological: He is alert and oriented to person, place, and time.  Skin: Skin is warm and dry. No rash noted. No erythema.  Psychiatric: He has a normal mood and affect. His behavior is normal. Judgment and thought content normal.  Vitals reviewed.     BP 120/80   Pulse 71   Temp (!) 96.8 F (36 C) (Oral)   Ht 5' 10"  (1.778 m)   Wt 211 lb 12.8 oz (96.1 kg)   BMI 30.39 kg/m      Assessment & Plan:  1. Coronary artery disease involving native coronary artery of native heart without angina pectoris -  CMP14+EGFR  2. Pulmonary emphysema, unspecified emphysema type ([name]) - CMP14+EGFR  3. Diabetes mellitus without complication (Anthony [name]) - Bayer DCA Hb A1c Waived - CMP14+EGFR  4. Hyperlipidemia associated with type 2 diabetes mellitus (HCC) - CMP14+EGFR - Lipid panel  5. Tobacco  abuse - CMP14+EGFR  6. Obesity (BMI 30-39.9)  - CMP14+EGFR  7. Moderate episode of recurrent major depressive disorder (Anthony [name]) - CMP14+EGFR   Continue all meds Labs pending Health Maintenance reviewed Diet and exercise encouraged RTO 6 months   Anthony Dun, FNP

## 2017-02-15 NOTE — Patient Instructions (Signed)

## 2017-02-16 LAB — CMP14+EGFR
A/G RATIO: 1.7 (ref 1.2–2.2)
ALBUMIN: 4.4 g/dL (ref 3.6–4.8)
ALT: 31 IU/L (ref 0–44)
AST: 24 IU/L (ref 0–40)
Alkaline Phosphatase: 59 IU/L (ref 39–117)
BILIRUBIN TOTAL: 0.4 mg/dL (ref 0.0–1.2)
BUN/Creatinine Ratio: 24 (ref 10–24)
BUN: 21 mg/dL (ref 8–27)
CHLORIDE: 100 mmol/L (ref 96–106)
CO2: 19 mmol/L — ABNORMAL LOW (ref 20–29)
Calcium: 9.2 mg/dL (ref 8.6–10.2)
Creatinine, Ser: 0.87 mg/dL (ref 0.76–1.27)
GFR calc non Af Amer: 89 mL/min/{1.73_m2} (ref >59)
GFR, EST AFRICAN AMERICAN: 103 mL/min/{1.73_m2} (ref >59)
Globulin, Total: 2.6 g/dL (ref 1.5–4.5)
Glucose: 152 mg/dL — ABNORMAL HIGH (ref 65–99)
POTASSIUM: 4.2 mmol/L (ref 3.5–5.2)
SODIUM: 139 mmol/L (ref 134–144)
TOTAL PROTEIN: 7 g/dL (ref 6.0–8.5)

## 2017-02-16 LAB — LIPID PANEL
Chol/HDL Ratio: 5.1 ratio — ABNORMAL HIGH (ref 0.0–5.0)
Cholesterol, Total: 239 mg/dL — ABNORMAL HIGH (ref 100–199)
HDL: 47 mg/dL (ref >39)
LDL Calculated: 125 mg/dL — ABNORMAL HIGH (ref 0–99)
Triglycerides: 336 mg/dL — ABNORMAL HIGH (ref 0–149)
VLDL Cholesterol Cal: 67 mg/dL — ABNORMAL HIGH (ref 5–40)

## 2017-02-19 ENCOUNTER — Other Ambulatory Visit: Payer: Self-pay | Admitting: Family

## 2017-02-19 MED ORDER — ATORVASTATIN CALCIUM 40 MG PO TABS: 40.0000 mg | ORAL_TABLET | Freq: Every day | ORAL | 1 refills | Status: DC

## 2017-02-25 ENCOUNTER — Encounter: Payer: Self-pay | Admitting: Cardiovascular Disease

## 2017-02-25 ENCOUNTER — Ambulatory Visit: Payer: Medicare Other | Admitting: Cardiovascular Disease

## 2017-02-25 VITALS — BP 110/72 | HR 63 | Ht 70.0 in | Wt 211.0 lb

## 2017-02-25 DIAGNOSIS — I2510 Atherosclerotic heart disease of native coronary artery without angina pectoris: Secondary | ICD-10-CM

## 2017-02-25 DIAGNOSIS — E7800 Pure hypercholesterolemia, unspecified: Secondary | ICD-10-CM | POA: Diagnosis not present

## 2017-02-25 MED ORDER — ASPIRIN EC 81 MG PO TBEC: 81.0000 mg | DELAYED_RELEASE_TABLET | Freq: Every day | ORAL | 3 refills | Status: DC

## 2017-02-25 NOTE — Patient Instructions (Signed)
Medication Instructions:  Your physician recommends that you continue on your current medications as directed. Please refer to the Current Medication list given to you today.   Labwork: None ordered  Testing/Procedures: None ordered  Follow-Up: Your physician wants you to follow-up in: 1 year with Dr. McAlhany. You will receive a reminder letter in the mail two months in advance. If you don't receive a letter, please call our office to schedule the follow-up appointment.   Any Other Special Instructions Will Be Listed Below (If Applicable).     If you need a refill on your cardiac medications before your next appointment, please call your pharmacy.   

## 2017-02-25 NOTE — Progress Notes (Signed)
Chief Complaint  Patient presents with  . Coronary Artery Disease     History of Present Illness: 67 yo male with history of CAD, HLD, tobacco abuse, bipolar disorder, COPD, DVT 2008 here today for cardiac follow up. He presented to [facility] in June 2012  with an inferior ST elevation myocardial infarction. Cardiac catheterization demonstrated a totally occluded mid RCA. This was treated with a Vision bare metal stent. He had moderate non-obstructive disease in the LAD.  Left ventriculogram demonstrated inferior hypokinesis with an EF of 50%. Recurrence of DVT March 2014 and has been on Xarelto.   He is here today for follow up. The patient denies any chest pain, dyspnea, palpitations, lower extremity edema, orthopnea, PND, dizziness, near syncope or syncope. He has not been taking his ASA due to forgetting.   Primary Care Physician: Sharion Balloon, FNP   Past Medical History:  Diagnosis Date  . Anxiety   . Arthritis   . Bipolar disorder ([place])   . CAD (coronary artery disease)    a. INF STEMI 07/04/10:  tx with thrombectomy + Vision BMS to [facility];  cath 07/04/10: dLM 10-20%, pLAD 40-50%, mLAD 20-30%, pRCA 30%, mRCA occluded and tx with PCI, EF 50% with inf HK. A Multilink  . COPD (chronic obstructive pulmonary disease) ([place])   . Depression   . Glucose intolerance (impaired glucose tolerance)    A1c 6.2 06/2010  . History of DVT (deep vein thrombosis)    traumatic, s/p coumadin tx.  Marland Kitchen HLD (hyperlipidemia)   . Hypertension    pt states is currently on no medications  . Myocardial infarction ([place])    around 2013  . Shortness of breath    walking distance or climbing stairs  . Tobacco abuse   . Urinary frequency     Past Surgical History:  Procedure Laterality Date  . CARDIAC CATHETERIZATION    . COLONOSCOPY N/A 12/29/2015   Procedure: COLONOSCOPY;  Surgeon: Rogene Houston, MD;  Location: [facility];  Service: Endoscopy;  Laterality: N/A;  12:55  .  ELECTROCARDIOGRAM     Showed inferolateral ST-elevation and a code STEMI was activated. In the ER, he was treated with morphine, herarin, and 600 mg of Plavix. He was transferred emergently to [facility].   . INGUINAL HERNIA REPAIR Left 07/26/2014   Procedure: OPEN REPAIR OF RECURENT LEFT INGUINAL HERNIA REPAIR WITH MESH;  Surgeon: Greer Pickerel, MD;  Location: [facility];  Service: General;  Laterality: Left;  . INSERTION OF MESH Bilateral 10/30/2013   Procedure: INSERTION OF MESH;  Surgeon: Gayland Curry, MD;  Location: [facility];  Service: General;  Laterality: Bilateral;  . LAPAROSCOPIC INGUINAL HERNIA WITH UMBILICAL HERNIA Bilateral 10/30/2013   Procedure: laparoscopic repair left pantaloom hernia with mesh, laparoscopic right inguinal hernia with mesh, OPEN REPAIR OF UMBILICAL HERNIA ;  Surgeon: Gayland Curry, MD;  Location: [facility];  Service: General;  Laterality: Bilateral;  . LAPAROSCOPY N/A 07/26/2014   Procedure: LAPAROSCOPY DIAGNOSTIC;  Surgeon: Greer Pickerel, MD;  Location: [facility];  Service: General;  Laterality: N/A;  . POLYPECTOMY  12/29/2015   Procedure: POLYPECTOMY;  Surgeon: Rogene Houston, MD;  Location: [facility];  Service: Endoscopy;;  ascending colon, descending colon  . stent placement    . TOTAL HIP ARTHROPLASTY Right 05/10/2015   Procedure: RIGHT TOTAL HIP ARTHROPLASTY ANTERIOR APPROACH;  Surgeon: Paralee Cancel, MD;  Location: [facility];  Service: Orthopedics;  Laterality: Right;  Marland Kitchen VASECTOMY  Current Outpatient Medications  Medication Sig Dispense Refill  . 5-Hydroxytryptophan (5-HTP PO) Take 1 tablet by mouth at bedtime.    Marland Kitchen albuterol (PROVENTIL HFA;VENTOLIN HFA) 108 (90 Base) MCG/ACT inhaler Inhale 2 puffs into the lungs every 6 (six) hours as needed for wheezing or shortness of breath. 1 Inhaler 0  . atorvastatin (LIPITOR) 40 MG tablet Take 1 tablet (40 mg total) by mouth daily. 90 tablet 1  . blood glucose meter kit and supplies KIT Dispense based on patient and  insurance preference. Use to check BG once daily or every other other.  Dx: E11.9 1 each 0  . buPROPion (WELLBUTRIN SR) 150 MG 12 hr tablet TAKE 1 TABLET BY MOUTH TWICE DAILY. TO HELP WITH SMOKING CESSATION. 60 tablet 1  . Cyanocobalamin (VITAMIN B-12 PO) Take 1,000 mg by mouth at bedtime.     . fluticasone (FLONASE) 50 MCG/ACT nasal spray Place 2 sprays into both nostrils daily. 16 g 6  . Fluticasone-Umeclidin-Vilant (TRELEGY ELLIPTA) 100-62.5-25 MCG/INH AEPB Inhale 1 puff into the lungs daily. 1 each 3  . MELATONIN PO Take 2 tablets by mouth at bedtime.     . metFORMIN (GLUCOPHAGE-XR) 500 MG 24 hr tablet Take 500 mg by mouth daily with breakfast.    . metoprolol tartrate (LOPRESSOR) 25 MG tablet TAKE ONE-HALF TABLET BY MOUTH TWICE DAILY (Patient taking differently: TAKE ONE-HALF (12.5 MG) TABLET BY MOUTH TWICE DAILY) 90 tablet 1  . naproxen sodium (ANAPROX) 220 MG tablet Take 220 mg by mouth 2 (two) times daily as needed (pain).     Marland Kitchen sennosides-docusate sodium (SENOKOT-S) 8.6-50 MG tablet Take 1 tablet by mouth 3 (three) times daily as needed for constipation.     . traMADol (ULTRAM) 50 MG tablet Take 1 tablet (50 mg total) by mouth every 12 (twelve) hours as needed for moderate pain or severe pain. 30 tablet 2  . VALERIAN ROOT PO Take 1 capsule by mouth at bedtime.     Marland Kitchen aspirin EC 81 MG tablet Take 1 tablet (81 mg total) by mouth daily. 90 tablet 3   No current facility-administered medications for this visit.     Allergies  Allergen Reactions  . Flomax [Tamsulosin Hcl] Other (See Comments)    This medication makes patient feel sick  . Penicillins Other (See Comments)    Reaction unknown occurred during childhood Has patient had a PCN reaction causing immediate rash, facial/tongue/throat swelling, SOB or lightheadedness with hypotension: unsure - childhood reaction Has patient had a PCN reaction causing severe rash involving mucus membranes or skin necrosis: unsure - childhood  reaction Has patient had a PCN reaction that required hospitalization no Has patient had a PCN reaction occurring within the last 10 years: no If all of the above answers are "NO", then may p    Social History   Socioeconomic History  . Marital status: Divorced    Spouse name: Not on file  . Number of children: Not on file  . Years of education: Not on file  . Highest education level: Not on file  Social Needs  . Financial resource strain: Not on file  . Food insecurity - worry: Not on file  . Food insecurity - inability: Not on file  . Transportation needs - medical: Not on file  . Transportation needs - non-medical: Not on file  Occupational History  . Not on file  Tobacco Use  . Smoking status: Former Smoker    Packs/day: 0.50    Years: 45.00  Pack years: 22.50    Types: Cigarettes    Last attempt to quit: 10/26/2016    Years since quitting: 0.3  . Smokeless tobacco: Never Used  Substance and Sexual Activity  . Alcohol use: No    Comment: past hx of ETOH use was in inpt facility per court order  - 20 years, 10-18-2015 per pt not anymore,per pt stopped about 15-20 yrs ago  . Drug use: No    Comment: snorted heroin and cocaine, 10-18-2015 per pt in the past he did Marijuana only and nothing else  . Sexual activity: Not on file  Other Topics Concern  . Not on file  Social History Narrative   Lives in [place]. He lives alone. He has kids but is not married.     Family History  Problem Relation Age of Onset  . Coronary artery disease Father   . Depression Father   . Depression Mother     Review of Systems:  As stated in the HPI and otherwise negative.   BP 110/72   Pulse 63   Ht '5\' 10"'$  (1.778 m)   Wt 211 lb (95.7 kg)   SpO2 91%   BMI 30.28 kg/m   Physical Examination:  General: Well developed, well nourished, NAD  HEENT: OP clear, mucus membranes moist  SKIN: warm, dry. No rashes. Neuro: No focal deficits  Musculoskeletal: Muscle strength 5/5 all ext   Psychiatric: Mood and affect normal  Neck: No JVD, no carotid bruits, no thyromegaly, no lymphadenopathy.  Lungs:Clear bilaterally, no wheezes, rhonci, crackles Cardiovascular: Regular rate and rhythm. No murmurs, gallops or rubs. Abdomen:Soft. Bowel sounds present. Non-tender.  Extremities: No lower extremity edema. Pulses are 2 + in the bilateral DP/PT.  EKG:  EKG is ordered today. The ekg ordered today demonstrates NSR, rate 63 bpm. Old inferior Q waves.   Recent Labs: 10/12/2016: Hemoglobin 16.5; Platelets 279 02/15/2017: ALT 31; BUN 21; Creatinine, Ser 0.87; Potassium 4.2; Sodium 139   Lipid Panel    Component Value Date/Time   CHOL 239 (H) 02/15/2017 1617   TRIG 336 (H) 02/15/2017 1617   HDL 47 02/15/2017 1617   CHOLHDL 5.1 (H) 02/15/2017 1617   CHOLHDL 6.3 (H) 01/03/2016 0914   VLDL 65 (H) 01/03/2016 0914   LDLCALC 125 (H) 02/15/2017 1617     Wt Readings from Last 3 Encounters:  02/25/17 211 lb (95.7 kg)  02/15/17 211 lb 12.8 oz (96.1 kg)  01/18/17 220 lb (99.8 kg)     Other studies Reviewed: Additional studies/ records that were reviewed today include: . Review of the above records demonstrates:   Assessment and Plan:   1. CAD without angina: he has no chest pain suggestive of angina. Will continue ASA, statin and beta blocker.    2. DVT: resolved. He is now off of Xarelto.   3. HLD: Continue statin. LDL not at goal last week in primary care but he had not been taking his statin.   5. Tobacco abuse: Smoking cessation counseling given.   Current medicines are reviewed at length with the patient today.  The patient does not have concerns regarding medicines.  The following changes have been made:  no change  Labs/ tests ordered today include:   Orders Placed This Encounter  Procedures  . EKG 12-Lead     Disposition:   FU with me in 12 months   Signed, Lauree Chandler, MD 02/25/2017 9:12 AM    [address],  [place],   [address] Phone: [phone]; Fax: [phone]

## 2017-03-11 ENCOUNTER — Telehealth: Payer: Self-pay | Admitting: Family

## 2017-03-11 DIAGNOSIS — K4021 Bilateral inguinal hernia, without obstruction or gangrene, recurrent: Secondary | ICD-10-CM

## 2017-03-11 DIAGNOSIS — Z0289 Encounter for other administrative examinations: Secondary | ICD-10-CM

## 2017-03-11 DIAGNOSIS — F1120 Opioid dependence, uncomplicated: Secondary | ICD-10-CM

## 2017-03-11 DIAGNOSIS — G8929 Other chronic pain: Secondary | ICD-10-CM

## 2017-03-11 DIAGNOSIS — M25551 Pain in right hip: Principal | ICD-10-CM

## 2017-03-11 MED ORDER — TRAMADOL HCL 50 MG PO TABS: 50.0000 mg | ORAL_TABLET | Freq: Two times a day (BID) | ORAL | 2 refills | Status: DC | PRN

## 2017-03-11 NOTE — Telephone Encounter (Signed)
Patient is aware you are out of the office until tomorrow and no one else can fill the Tramadol.  Please see below.

## 2017-03-11 NOTE — Telephone Encounter (Signed)
Prescription sent to pharmacy.

## 2017-03-26 ENCOUNTER — Encounter: Payer: Self-pay | Admitting: Family

## 2017-03-26 ENCOUNTER — Ambulatory Visit (INDEPENDENT_AMBULATORY_CARE_PROVIDER_SITE_OTHER): Payer: Medicare HMO | Admitting: Family

## 2017-03-26 VITALS — BP 126/67 | HR 98 | Temp 97.2°F | Ht 70.0 in | Wt 212.0 lb

## 2017-03-26 DIAGNOSIS — M159 Polyosteoarthritis, unspecified: Secondary | ICD-10-CM

## 2017-03-26 DIAGNOSIS — M150 Primary generalized (osteo)arthritis: Secondary | ICD-10-CM

## 2017-03-26 DIAGNOSIS — G4700 Insomnia, unspecified: Secondary | ICD-10-CM

## 2017-03-26 DIAGNOSIS — M7989 Other specified soft tissue disorders: Secondary | ICD-10-CM | POA: Diagnosis not present

## 2017-03-26 MED ORDER — NAPROXEN 500 MG PO TABS: 500.0000 mg | ORAL_TABLET | Freq: Two times a day (BID) | ORAL | 1 refills | Status: DC

## 2017-03-26 MED ORDER — TEMAZEPAM 15 MG PO CAPS: 15.0000 mg | ORAL_CAPSULE | Freq: Every evening | ORAL | 0 refills | Status: DC | PRN

## 2017-03-26 NOTE — Progress Notes (Signed)
   Subjective:    Patient ID: Anthony Form., male    DOB: 1950/12/26, 67 y.o.   MRN: [id number]  Pt presents to the office today with complaints of insomnia.   States he is having right leg swelling and pain. Pt reports he has had a DVT in this leg before.  Insomnia  Primary symptoms: difficulty falling asleep, frequent awakening, premature morning awakening, malaise/fatigue.  The current episode started more than one year. The onset quality is gradual. The problem occurs intermittently. The problem has been waxing and waning since onset. The symptoms are aggravated by tobacco. Past treatments include medication. The treatment provided mild relief.  Arthritis  Presents for follow-up visit. He complains of pain and stiffness. The symptoms have been stable. Affected locations include the right knee, left knee, right ankle and left ankle. His pain is at a severity of 8/10.      Review of Systems  Constitutional: Positive for malaise/fatigue.  Musculoskeletal: Positive for arthritis and stiffness.  Psychiatric/Behavioral: The patient has insomnia.   All other systems reviewed and are negative.      Objective:   Physical Exam  Constitutional: He is oriented to person, place, and time. He appears well-developed and well-nourished. No distress.  HENT:  Head: Normocephalic.  Right Ear: External ear normal.  Left Ear: External ear normal.  Nose: Nose normal.  Mouth/Throat: Oropharynx is clear and moist.  Eyes: Pupils are equal, round, and reactive to light. Right eye exhibits no discharge. Left eye exhibits no discharge.  Neck: Normal range of motion. Neck supple. No thyromegaly present.  Cardiovascular: Normal rate, regular rhythm, normal heart sounds and intact distal pulses.  No murmur heard. Pulmonary/Chest: Effort normal and breath sounds normal. No respiratory distress. He has no wheezes.  Abdominal: Soft. Bowel sounds are normal. He exhibits no distension. There is no  tenderness.  Musculoskeletal: Normal range of motion. He exhibits edema. He exhibits no tenderness.  2+ in RLE, positive Homans sign  Neurological: He is alert and oriented to person, place, and time.  Skin: Skin is warm and dry. No rash noted. No erythema.  Psychiatric: He has a normal mood and affect. His behavior is normal. Judgment and thought content normal.  Vitals reviewed.   BP 126/67   Pulse 98   Temp (!) 97.2 F (36.2 C) (Oral)   Ht 5\' 10"  (1.778 m)   Wt 212 lb (96.2 kg)   BMI 30.42 kg/m        Assessment & Plan:  1. Insomnia, unspecified type Will start Restoril today Sleep ritual  Stress management discussed - temazepam (RESTORIL) 15 MG capsule; Take 1 capsule (15 mg total) by mouth at bedtime as needed for sleep.  Dispense: 30 capsule; Refill: 0  2. Right leg swelling - VAS [place] LOWER EXTREMITY VENOUS (DVT); Future  3. Primary osteoarthritis involving multiple joints Stop Aleve OTC and any other NSAID's - naproxen (NAPROSYN) 500 MG tablet; Take 1 tablet (500 mg total) by mouth 2 (two) times daily with a meal.  Dispense: 60 tablet; Refill: [place], FNP

## 2017-03-26 NOTE — Patient Instructions (Signed)

## 2017-03-28 ENCOUNTER — Ambulatory Visit (HOSPITAL_COMMUNITY)
Admission: RE | Admit: 2017-03-28 | Discharge: 2017-03-28 | Disposition: A | Discharge status: Home / Self Care | Payer: Medicare HMO | Source: Ambulatory Visit | Attending: Family | Admitting: Family

## 2017-03-28 DIAGNOSIS — R600 Localized edema: Secondary | ICD-10-CM | POA: Diagnosis not present

## 2017-03-28 DIAGNOSIS — M7989 Other specified soft tissue disorders: Secondary | ICD-10-CM | POA: Insufficient documentation

## 2017-03-29 ENCOUNTER — Telehealth: Payer: Self-pay | Admitting: Family

## 2017-04-03 ENCOUNTER — Other Ambulatory Visit: Payer: Self-pay | Admitting: Cardiovascular Disease

## 2017-04-04 ENCOUNTER — Telehealth: Payer: Self-pay | Admitting: *Deleted

## 2017-04-04 NOTE — Telephone Encounter (Signed)
Normal doppler.

## 2017-04-23 ENCOUNTER — Other Ambulatory Visit: Payer: Self-pay | Admitting: *Deleted

## 2017-04-23 DIAGNOSIS — G4700 Insomnia, unspecified: Secondary | ICD-10-CM

## 2017-04-23 MED ORDER — TEMAZEPAM 15 MG PO CAPS: 15.0000 mg | ORAL_CAPSULE | Freq: Every evening | ORAL | 0 refills | Status: DC | PRN

## 2017-04-24 ENCOUNTER — Other Ambulatory Visit: Payer: Self-pay | Admitting: Family

## 2017-04-24 DIAGNOSIS — G4700 Insomnia, unspecified: Secondary | ICD-10-CM

## 2017-04-25 ENCOUNTER — Telehealth: Payer: Self-pay | Admitting: Family

## 2017-04-25 ENCOUNTER — Other Ambulatory Visit: Payer: Self-pay | Admitting: Family

## 2017-04-25 ENCOUNTER — Other Ambulatory Visit: Payer: Self-pay | Admitting: *Deleted

## 2017-04-25 DIAGNOSIS — G4700 Insomnia, unspecified: Secondary | ICD-10-CM

## 2017-04-25 MED ORDER — TEMAZEPAM 15 MG PO CAPS: 15.0000 mg | ORAL_CAPSULE | Freq: Every evening | ORAL | 0 refills | Status: DC | PRN

## 2017-04-25 NOTE — Telephone Encounter (Signed)
Script was done on 0416-19. Left message for patient.

## 2017-04-25 NOTE — Telephone Encounter (Signed)
Provider taking care of it.

## 2017-04-25 NOTE — Telephone Encounter (Signed)
Please call patient when done

## 2017-04-25 NOTE — Telephone Encounter (Signed)
Second CALL the rx was sent to wrong pharmacy. Please advise and sent to right pharmacy [facility] in [place]

## 2017-05-13 ENCOUNTER — Ambulatory Visit (INDEPENDENT_AMBULATORY_CARE_PROVIDER_SITE_OTHER): Payer: Medicare HMO | Admitting: Family

## 2017-05-13 ENCOUNTER — Encounter: Payer: Self-pay | Admitting: Family

## 2017-05-13 VITALS — BP 129/90 | HR 78 | Temp 96.8°F | Ht 70.0 in | Wt 215.0 lb

## 2017-05-13 DIAGNOSIS — M791 Myalgia, unspecified site: Secondary | ICD-10-CM | POA: Diagnosis not present

## 2017-05-13 DIAGNOSIS — M79604 Pain in right leg: Secondary | ICD-10-CM

## 2017-05-13 DIAGNOSIS — M79605 Pain in left leg: Secondary | ICD-10-CM

## 2017-05-13 MED ORDER — BACLOFEN 10 MG PO TABS: 10.0000 mg | ORAL_TABLET | Freq: Two times a day (BID) | ORAL | 2 refills | Status: DC | PRN

## 2017-05-13 MED ORDER — BACLOFEN 10 MG PO TABS: 10.0000 mg | ORAL_TABLET | Freq: Two times a day (BID) | ORAL | 2 refills | Status: DC

## 2017-05-13 MED ORDER — BUPROPION HCL ER (SR) 150 MG PO TB12: ORAL_TABLET | ORAL | 1 refills | Status: DC

## 2017-05-13 NOTE — Patient Instructions (Signed)

## 2017-05-13 NOTE — Progress Notes (Signed)
   Subjective:    Patient ID: Anthony [name]., male    DOB: 01/26/1950, 67 y.o.   MRN: [id number]  Chief Complaint  Patient presents with  . right leg pain   Pt presents to the office today with intermittent  bilateral leg pain. States he has been trying to walk 2 miles a day and believes his pain is worse after walking.  Leg Pain   The incident occurred more than 1 week ago. There was no injury mechanism. The pain is present in the left thigh, right leg, right thigh, right foot, right knee and left foot. The quality of the pain is described as aching. The pain is at a severity of 8/10. The pain is moderate. The pain has been constant since onset. Pertinent negatives include no loss of motion, numbness or tingling. He reports no foreign bodies present. The symptoms are aggravated by movement. He has tried acetaminophen and rest for the symptoms. The treatment provided mild relief.      Review of Systems  Neurological: Negative for tingling and numbness.  All other systems reviewed and are negative.      Objective:   Physical Exam  Constitutional: He is oriented to person, place, and time. He appears well-developed and well-nourished. No distress.  HENT:  Head: Normocephalic.  Right Ear: External ear normal.  Left Ear: External ear normal.  Mouth/Throat: Oropharynx is clear and moist.  Eyes: Pupils are equal, round, and reactive to light. Right eye exhibits no discharge. Left eye exhibits no discharge.  Neck: Normal range of motion. Neck supple. No thyromegaly present.  Cardiovascular: Normal rate, regular rhythm, normal heart sounds and intact distal pulses.  No murmur heard. Pulmonary/Chest: Effort normal and breath sounds normal. No respiratory distress. He has no wheezes.  Abdominal: Soft. Bowel sounds are normal. He exhibits no distension. There is no tenderness.  Musculoskeletal: Normal range of motion. He exhibits no edema or tenderness.  Full ROM of bilateral legs     Neurological: He is alert and oriented to person, place, and time. He has normal reflexes.  Skin: Skin is warm and dry. No rash noted. No erythema.  Psychiatric: He has a normal mood and affect. His behavior is normal. Judgment and thought content normal.  Vitals reviewed.     BP 129/90   Pulse 78   Temp (!) 96.8 F (36 C) (Oral)   Ht 5' 10" (1.778 m)   Wt 215 lb (97.5 kg)   BMI 30.85 kg/m      Assessment & Plan:  [name]. comes in today with chief complaint of right leg pain   Diagnosis and orders addressed:  1. Pain in both lower extremities - BMP8+EGFR - Magnesium  2. Muscle pain - BMP8+EGFR - Magnesium - baclofen (LIORESAL) 10 MG tablet; Take 1 tablet (10 mg total) by mouth 2 (two) times daily as needed for muscle spasms.  Dispense: 60 tablet; Refill: 2   I believe this is related to overuse Discussed trying to work up to 2 miles and start with walking 1/2 mile a day and gradually increasing physical activity    , FNP   

## 2017-05-14 LAB — BMP8+EGFR
BUN/Creatinine Ratio: 13 (ref 10–24)
BUN: 13 mg/dL (ref 8–27)
CHLORIDE: 102 mmol/L (ref 96–106)
CO2: 20 mmol/L (ref 20–29)
Calcium: 9.2 mg/dL (ref 8.6–10.2)
Creatinine, Ser: 1 mg/dL (ref 0.76–1.27)
GFR calc Af Amer: 89 mL/min/{1.73_m2} (ref >59)
GFR calc non Af Amer: 77 mL/min/{1.73_m2} (ref >59)
GLUCOSE: 227 mg/dL — AB (ref 65–99)
Potassium: 4.5 mmol/L (ref 3.5–5.2)
SODIUM: 141 mmol/L (ref 134–144)

## 2017-05-14 LAB — MAGNESIUM: MAGNESIUM: 2.1 mg/dL (ref 1.6–2.3)

## 2017-05-20 ENCOUNTER — Other Ambulatory Visit: Payer: Self-pay | Admitting: Family

## 2017-05-20 DIAGNOSIS — G4700 Insomnia, unspecified: Secondary | ICD-10-CM

## 2017-05-22 ENCOUNTER — Other Ambulatory Visit: Payer: Self-pay | Admitting: Family

## 2017-05-22 DIAGNOSIS — M150 Primary generalized (osteo)arthritis: Principal | ICD-10-CM

## 2017-05-22 DIAGNOSIS — M159 Polyosteoarthritis, unspecified: Secondary | ICD-10-CM

## 2017-05-23 ENCOUNTER — Telehealth: Payer: Self-pay | Admitting: Family

## 2017-05-23 NOTE — Telephone Encounter (Signed)
Aware. 

## 2017-06-04 ENCOUNTER — Ambulatory Visit (INDEPENDENT_AMBULATORY_CARE_PROVIDER_SITE_OTHER): Payer: Medicare HMO | Admitting: Family

## 2017-06-04 ENCOUNTER — Encounter: Payer: Self-pay | Admitting: Family

## 2017-06-04 VITALS — BP 108/67 | HR 72 | Temp 97.9°F | Ht 70.0 in | Wt 216.0 lb

## 2017-06-04 DIAGNOSIS — E1165 Type 2 diabetes mellitus with hyperglycemia: Secondary | ICD-10-CM

## 2017-06-04 DIAGNOSIS — K4021 Bilateral inguinal hernia, without obstruction or gangrene, recurrent: Secondary | ICD-10-CM | POA: Diagnosis not present

## 2017-06-04 DIAGNOSIS — M79604 Pain in right leg: Secondary | ICD-10-CM

## 2017-06-04 DIAGNOSIS — R5383 Other fatigue: Secondary | ICD-10-CM | POA: Diagnosis not present

## 2017-06-04 DIAGNOSIS — Z720 Tobacco use: Secondary | ICD-10-CM | POA: Diagnosis not present

## 2017-06-04 DIAGNOSIS — M25551 Pain in right hip: Secondary | ICD-10-CM | POA: Diagnosis not present

## 2017-06-04 DIAGNOSIS — Z0289 Encounter for other administrative examinations: Secondary | ICD-10-CM

## 2017-06-04 DIAGNOSIS — Z86718 Personal history of other venous thrombosis and embolism: Secondary | ICD-10-CM | POA: Diagnosis not present

## 2017-06-04 DIAGNOSIS — G8929 Other chronic pain: Secondary | ICD-10-CM

## 2017-06-04 DIAGNOSIS — I739 Peripheral vascular disease, unspecified: Secondary | ICD-10-CM

## 2017-06-04 DIAGNOSIS — F1120 Opioid dependence, uncomplicated: Secondary | ICD-10-CM

## 2017-06-04 DIAGNOSIS — E669 Obesity, unspecified: Secondary | ICD-10-CM

## 2017-06-04 DIAGNOSIS — J439 Emphysema, unspecified: Secondary | ICD-10-CM | POA: Diagnosis not present

## 2017-06-04 DIAGNOSIS — R29898 Other symptoms and signs involving the musculoskeletal system: Secondary | ICD-10-CM | POA: Diagnosis not present

## 2017-06-04 DIAGNOSIS — M79605 Pain in left leg: Secondary | ICD-10-CM

## 2017-06-04 LAB — BAYER DCA HB A1C WAIVED: HB A1C (BAYER DCA - WAIVED): 7.4 % — ABNORMAL HIGH (ref <7.0)

## 2017-06-04 MED ORDER — FLUTICASONE-UMECLIDIN-VILANT 100-62.5-25 MCG/INH IN AEPB: 1.0000 | INHALATION_SPRAY | Freq: Every day | RESPIRATORY_TRACT | 3 refills | Status: DC

## 2017-06-04 MED ORDER — TRAMADOL HCL 50 MG PO TABS: 50.0000 mg | ORAL_TABLET | Freq: Two times a day (BID) | ORAL | 2 refills | Status: DC | PRN

## 2017-06-04 NOTE — Patient Instructions (Signed)
Intermittent Claudication Intermittent claudication is pain in your leg that occurs when you walk or exercise and goes away when you rest. The pain can occur in one or both legs. What are the causes? Intermittent claudication is caused by the buildup of plaque within the major arteries in the body (atherosclerosis). The plaque, which makes arteries stiff and narrow, prevents enough blood from reaching your leg muscles. The pain occurs when you walk or exercise because your muscles need more blood when you are moving and exercising. What increases the risk? Risk factors include:  A family history of atherosclerosis.  A personal history of stroke or heart disease.  Older age.  Being inactive or overweight.  Smoking cigarettes.  Having another health condition such as: ? Diabetes. ? High blood pressure. ? High cholesterol.  What are the signs or symptoms? Your hip or leg may:  Ache.  Cramp.  Feel tight.  Feel weak.  Feel heavy.  Over time, you may feel pain in your calf, thigh, or hip. How is this diagnosed? Your health care provider may diagnose intermittent claudication based on your symptoms and medical history. Your health care provider may also do tests to learn more about your condition. These may include:  Blood tests.  An ultrasound.  Imaging tests such as angiography, magnetic resonance angiography (MRA), and computed tomography angiography (CTA).  How is this treated? You may be treated for problems such as:  High blood pressure.  High cholesterol.  Diabetes.  Other treatments may include:  Lifestyle changes such as: ? Starting an exercise program. ? Losing weight. ? Quitting smoking.  Medicines to help restore blood flow through your legs.  Blood vessel surgery (angioplasty) to restore blood flow if your intermittent claudication is caused by severe peripheral artery disease.  Follow these instructions at home:  Manage any other health  conditions you have.  Eat a diet low in saturated fats and calories to maintain a healthy weight.  Quit smoking, if you smoke.  Take medicines only as directed by your health care provider.  If your health care provider recommended an exercise program for you, follow it as directed. Your exercise program may involve: ? Walking three or more times a week. ? Walking until you have certain symptoms of intermittent claudication. ? Resting until symptoms go away. ? Gradually increasing walking time to about 50 minutes a day. Contact a health care provider if: Your condition is not getting better or is getting worse. Get help right away if:  You have chest pain.  You have difficulty breathing.  You develop arm weakness.  You have trouble speaking.  Your face begins to droop. This information is not intended to replace advice given to you by your health care provider. Make sure you discuss any questions you have with your health care provider. Document Released: 10/28/2003 Document Revised: 06/02/2015 Document Reviewed: 04/02/2013 Elsevier Interactive Patient Education  2017 [employer]  

## 2017-06-04 NOTE — Progress Notes (Signed)
Subjective:    Patient ID: Anthony [name]., male    DOB: 05-31-1950, 67 y.o.   MRN: [id number]  Chief Complaint  Patient presents with  . pain in leg recheck   PT presents to the office today with recurrent bilateral leg pain. Reports right calf, bilateral knee, and bilateral feet pain that started several months ago and is unchanged. Pt was seen on 05/13/17 and given naprosyn that he states gives him some slight relief. Pt has hx of DVT, but had a negative doppler 03/28/17.  Leg Pain   There was no injury mechanism. The quality of the pain is described as aching. Pain scale: 2-7. The pain is moderate. The pain has been fluctuating since onset. Associated symptoms include muscle weakness. Pertinent negatives include no numbness or tingling. He reports no foreign bodies present. The symptoms are aggravated by movement and weight bearing. He has tried rest and NSAIDs for the symptoms. The treatment provided mild relief.  Diabetes  He presents for his follow-up diabetic visit. He has type 2 diabetes mellitus. There are no hypoglycemic associated symptoms. Pertinent negatives for diabetes include no blurred vision, no foot paresthesias and no polyuria. Symptoms are stable. He is following a generally unhealthy diet. (Pt does not check BS at home )  Cough  This is a chronic problem. The current episode started more than 1 year ago. The problem has been waxing and waning. The problem occurs every few minutes. Associated symptoms include myalgias. Pertinent negatives include no ear congestion or ear pain. The symptoms are aggravated by lying down.  Arthritis  Presents for follow-up visit. He complains of pain and stiffness. Affected locations include the right knee, left knee and right hip. His pain is at a severity of 6/10.  COPD  PT states he has restarted smoking and is smoking about one pack and half a week. States he has not been taking Trelegy, but has been using because he has been out.     Review of Systems  HENT: Negative for ear pain.   Eyes: Negative for blurred vision.  Respiratory: Positive for cough.   Endocrine: Negative for polyuria.  Musculoskeletal: Positive for arthritis, myalgias and stiffness.  Neurological: Negative for tingling and numbness.  All other systems reviewed and are negative.      Objective:   Physical Exam  Constitutional: He is oriented to person, place, and time. He appears well-developed and well-nourished. No distress.  HENT:  Head: Normocephalic.  Right Ear: External ear normal.  Left Ear: External ear normal.  Mouth/Throat: Oropharynx is clear and moist.  Eyes: Pupils are equal, round, and reactive to light. Right eye exhibits no discharge. Left eye exhibits no discharge.  Neck: Normal range of motion. Neck supple. No thyromegaly present.  Cardiovascular: Normal rate, regular rhythm, normal heart sounds and intact distal pulses.  No murmur heard. Pulmonary/Chest: Effort normal and breath sounds normal. No respiratory distress. He has no wheezes.  Abdominal: Soft. Bowel sounds are normal. He exhibits no distension. There is no tenderness.  Musculoskeletal: Normal range of motion. He exhibits no edema or tenderness.  Neurological: He is alert and oriented to person, place, and time. He has normal reflexes. No cranial nerve deficit.  Skin: Skin is warm and dry. No rash noted. No erythema.  Psychiatric: He has a normal mood and affect. His behavior is normal. Judgment and thought content normal.  Vitals reviewed.     BP 108/67   Pulse 72   Temp 97.9 F (  36.6 C) (Oral)   Ht 5\' 10"  (1.778 m)   Wt 216 lb (98 kg)   BMI 30.99 kg/m      Assessment & Plan:  Anthony [name]. comes in today with chief complaint of pain in leg recheck   Diagnosis and orders addressed:  1. Leg weakness, bilateral - TSH - Ambulatory referral to Vascular Surgery - Compression stockings  2. Pain in both lower extremities - traMADol (ULTRAM)  50 MG tablet; Take 1 tablet (50 mg total) by mouth every 12 (twelve) hours as needed for moderate pain or severe pain.  Dispense: 30 tablet; Refill: 2 - Ambulatory referral to Vascular Surgery  3. Type 2 diabetes mellitus with hyperglycemia, without long-term current use of insulin (HCC) - Bayer DCA Hb A1c Waived  4. Pulmonary emphysema, unspecified emphysema type ([place]) Will reorder Trelegy  Smoking cessation discussed - Fluticasone-Umeclidin-Vilant (TRELEGY ELLIPTA) 100-62.5-25 MCG/INH AEPB; Inhale 1 puff into the lungs daily.  Dispense: 1 each; Refill: 3  5. History of DVT of lower extremity - Ambulatory referral to Vascular Surgery - Compression stockings  6. Tobacco abuse - Ambulatory referral to Vascular Surgery  7. Obesity (BMI 30-39.9) - Ambulatory referral to Vascular Surgery  8. Pain medication agreement signed - traMADol (ULTRAM) 50 MG tablet; Take 1 tablet (50 mg total) by mouth every 12 (twelve) hours as needed for moderate pain or severe pain.  Dispense: 30 tablet; Refill: 2  9. Chronic right hip pain - traMADol (ULTRAM) 50 MG tablet; Take 1 tablet (50 mg total) by mouth every 12 (twelve) hours as needed for moderate pain or severe pain.  Dispense: 30 tablet; Refill: 2  10. Bilateral recurrent inguinal hernia without obstruction or gangrene  11. Uncomplicated opioid dependence (HCC) - traMADol (ULTRAM) 50 MG tablet; Take 1 tablet (50 mg total) by mouth every 12 (twelve) hours as needed for moderate pain or severe pain.  Dispense: 30 tablet; Refill: 2  12. Fatigue, unspecified type - TSH  13. Intermittent claudication (HCC) Discussed stopping smoking, having good control of DM and cholesterol  Encourage exercise - Ambulatory referral to Vascular Surgery - Compression stockings   Labs pending Health Maintenance reviewed Diet and exercise encouraged  Follow up plan: 3 months    Anthony Dun, FNP

## 2017-06-05 LAB — TSH: TSH: 1.65 u[IU]/mL (ref 0.450–4.500)

## 2017-06-10 ENCOUNTER — Other Ambulatory Visit: Payer: Self-pay

## 2017-06-10 DIAGNOSIS — I739 Peripheral vascular disease, unspecified: Secondary | ICD-10-CM

## 2017-06-13 ENCOUNTER — Other Ambulatory Visit: Payer: Self-pay | Admitting: Family

## 2017-06-17 ENCOUNTER — Telehealth: Payer: Self-pay | Admitting: Family

## 2017-06-17 ENCOUNTER — Encounter: Payer: Medicare HMO | Admitting: Vascular Surgery

## 2017-06-17 ENCOUNTER — Encounter (HOSPITAL_COMMUNITY): Payer: Self-pay

## 2017-06-17 NOTE — Telephone Encounter (Signed)
I do not start new Testerone injections. He would need to follow up with Endocrinologists. We can do a referral if he would like, but would need to have lab work. Needs to get lab work drawn before 10 AM.

## 2017-06-17 NOTE — Telephone Encounter (Signed)
Refer to previous phone note 

## 2017-06-17 NOTE — Telephone Encounter (Signed)
Patient aware and verbalizes understanding. Patient states he will call and let us know what he decides to do.

## 2017-06-17 NOTE — Telephone Encounter (Signed)
lmtcb

## 2017-06-21 ENCOUNTER — Telehealth: Payer: Self-pay | Admitting: Family

## 2017-06-25 NOTE — Telephone Encounter (Signed)
Aware.  He can get pneumonia injections any time.  Advised to try more walking and then a sit down stationary bike to increase strength in lower legs.  He may discuss having blood work to check testosterone levels at a later visit.

## 2017-07-05 ENCOUNTER — Encounter: Payer: Self-pay | Admitting: Vascular Surgery

## 2017-07-05 ENCOUNTER — Ambulatory Visit (HOSPITAL_COMMUNITY)
Admission: RE | Admit: 2017-07-05 | Discharge: 2017-07-05 | Disposition: A | Discharge status: Home / Self Care | Payer: Medicare HMO | Source: Ambulatory Visit | Attending: Vascular Surgery | Admitting: Vascular Surgery

## 2017-07-05 ENCOUNTER — Ambulatory Visit: Payer: Medicare HMO | Admitting: Vascular Surgery

## 2017-07-05 VITALS — BP 116/80 | HR 65 | Temp 97.4°F | Resp 16 | Ht 70.0 in | Wt 212.8 lb

## 2017-07-05 DIAGNOSIS — I872 Venous insufficiency (chronic) (peripheral): Secondary | ICD-10-CM

## 2017-07-05 DIAGNOSIS — I739 Peripheral vascular disease, unspecified: Secondary | ICD-10-CM

## 2017-07-05 DIAGNOSIS — E1151 Type 2 diabetes mellitus with diabetic peripheral angiopathy without gangrene: Secondary | ICD-10-CM | POA: Insufficient documentation

## 2017-07-05 DIAGNOSIS — E785 Hyperlipidemia, unspecified: Secondary | ICD-10-CM | POA: Diagnosis not present

## 2017-07-05 DIAGNOSIS — I724 Aneurysm of artery of lower extremity: Secondary | ICD-10-CM

## 2017-07-05 DIAGNOSIS — Z87891 Personal history of nicotine dependence: Secondary | ICD-10-CM | POA: Insufficient documentation

## 2017-07-05 NOTE — Progress Notes (Addendum)
Patient name: Anthony [name]. MRN: [id number] DOB: 06-Mar-1950 Sex: male  REASON FOR CONSULT:    Leg weakness and pain.  The consult is requested by Dr. Lenna Gilford.  HPI:   Anthony [name]. is a pleasant 67 y.o. male, who was referred with leg pain.  I have reviewed the records that were sent from the referring office.  The patient was seen on 06/04/2017 with leg pain.  The patient has had bilateral leg pain that involves the calf knees and feet bilaterally.  This it started several months ago and was stable.  Patient had a negative venous Doppler study in March 2009 reportedly.  Patient also has a history of type 2 diabetes which has been under good control patient is sent for vascular consultation.  The patient describes the gradual onset of pain in both knees and ankles over the last 2 months.  The symptoms have been relatively stable.  I do not get any clear-cut history of claudication or rest pain.  He denies any history of nonhealing ulcers.  He does have a history of a DVT of the right lower extremity that was approximately 10 to 12 years ago.  He was on blood thinners for this but is no longer on anticoagulation.  He experiences some pain in the left foot and lateral ankle when standing.  His risk factors for peripheral vascular disease include diabetes and a history of tobacco use.  He is currently smoking up to 2 packs/day of cigarettes.  He denies any history of hypertension, hypercholesterolemia, or family history of premature cardiovascular disease.  Past Medical History:  Diagnosis Date  . Anxiety   . Arthritis   . Bipolar disorder ([place])   . CAD (coronary artery disease)    a. INF STEMI 07/04/10:  tx with thrombectomy + [facility] to [facility];  cath 07/04/10: dLM 10-20%, pLAD 40-50%, mLAD 20-30%, pRCA 30%, mRCA occluded and tx with PCI, EF 50% with inf HK. A Multilink  . COPD (chronic obstructive pulmonary disease) ([place])   . Depression   . Glucose intolerance (impaired glucose  tolerance)    A1c 6.2 06/2010  . History of DVT (deep vein thrombosis)    traumatic, s/p coumadin tx.  Marland Kitchen HLD (hyperlipidemia)   . Hypertension    pt states is currently on no medications  . Myocardial infarction ([place])    around 2013  . Shortness of breath    walking distance or climbing stairs  . Tobacco abuse   . Urinary frequency     Family History  Problem Relation Age of Onset  . Coronary artery disease Father   . Depression Father   . Depression Mother     SOCIAL HISTORY: Social History   Socioeconomic History  . Marital status: Divorced    Spouse name: Not on file  . Number of children: Not on file  . Years of education: Not on file  . Highest education level: Not on file  Occupational History  . Not on file  Social Needs  . Financial resource strain: Not on file  . Food insecurity:    Worry: Not on file    Inability: Not on file  . Transportation needs:    Medical: Not on file    Non-medical: Not on file  Tobacco Use  . Smoking status: Former Smoker    Packs/day: 0.50    Years: 45.00    Pack years: 22.50    Types: Cigarettes    Last  attempt to quit: 10/26/2016    Years since quitting: 0.6  . Smokeless tobacco: Never Used  Substance and Sexual Activity  . Alcohol use: No    Comment: past hx of ETOH use was in inpt facility per court order  - 20 years, 10-18-2015 per pt not anymore,per pt stopped about 15-20 yrs ago  . Drug use: No    Types: Marijuana, Heroin, Cocaine    Comment: snorted heroin and cocaine, 10-18-2015 per pt in the past he did Marijuana only and nothing else  . Sexual activity: Not on file  Lifestyle  . Physical activity:    Days per week: Not on file    Minutes per session: Not on file  . Stress: Not on file  Relationships  . Social connections:    Talks on phone: Not on file    Gets together: Not on file    Attends religious service: Not on file    Active member of club or organization: Not on file    Attends meetings of clubs  or organizations: Not on file    Relationship status: Not on file  . Intimate partner violence:    Fear of current or ex partner: Not on file    Emotionally abused: Not on file    Physically abused: Not on file    Forced sexual activity: Not on file  Other Topics Concern  . Not on file  Social History Narrative   Lives in [place]. He lives alone. He has kids but is not married.     Allergies  Allergen Reactions  . Flomax [Tamsulosin Hcl] Other (See Comments)    This medication makes patient feel sick  . Penicillins Other (See Comments)    Reaction unknown occurred during childhood Has patient had a PCN reaction causing immediate rash, facial/tongue/throat swelling, SOB or lightheadedness with hypotension: unsure - childhood reaction Has patient had a PCN reaction causing severe rash involving mucus membranes or skin necrosis: unsure - childhood reaction Has patient had a PCN reaction that required hospitalization no Has patient had a PCN reaction occurring within the last 10 years: no If all of the above answers are "NO", then may p    Current Outpatient Medications  Medication Sig Dispense Refill  . 5-Hydroxytryptophan (5-HTP PO) Take 1 tablet by mouth at bedtime.    Marland Kitchen albuterol (PROVENTIL HFA;VENTOLIN HFA) 108 (90 Base) MCG/ACT inhaler Inhale 2 puffs into the lungs every 6 (six) hours as needed for wheezing or shortness of breath. 1 Inhaler 0  . aspirin EC 81 MG tablet Take 1 tablet (81 mg total) by mouth daily. 90 tablet 3  . baclofen (LIORESAL) 10 MG tablet Take 1 tablet (10 mg total) by mouth 2 (two) times daily as needed for muscle spasms. 60 tablet 2  . blood glucose meter kit and supplies KIT Dispense based on patient and insurance preference. Use to check BG once daily or every other other.  Dx: E11.9 1 each 0  . buPROPion (WELLBUTRIN SR) 150 MG 12 hr tablet TAKE 1 TABLET BY MOUTH TWICE DAILY TO HELP WITH SMOKING CESSATION 180 tablet 1  . Cyanocobalamin (VITAMIN B-12 PO)  Take 1,000 mg by mouth at bedtime.     . fluticasone (FLONASE) 50 MCG/ACT nasal spray Place 2 sprays into both nostrils daily. 16 g 6  . Fluticasone-Umeclidin-Vilant (TRELEGY ELLIPTA) 100-62.5-25 MCG/INH AEPB Inhale 1 puff into the lungs daily. 1 each 3  . MELATONIN PO Take by mouth.    . metFORMIN (  GLUCOPHAGE-XR) 500 MG 24 hr tablet Take 500 mg by mouth daily with breakfast.    . metoprolol tartrate (LOPRESSOR) 25 MG tablet TAKE ONE-HALF TABLET BY MOUTH TWICE DAILY (Patient taking differently: TAKE ONE-HALF (12.5 MG) TABLET BY MOUTH TWICE DAILY) 90 tablet 1  . naproxen (NAPROSYN) 500 MG tablet TAKE 1 TABLET BY MOUTH TWICE DAILY WITH A MEAL 60 tablet 0  . sennosides-docusate sodium (SENOKOT-S) 8.6-50 MG tablet Take 1 tablet by mouth 3 (three) times daily as needed for constipation.     . temazepam (RESTORIL) 15 MG capsule TAKE 1 CAPSULE BY MOUTH AT BEDTIME AS NEEDED FOR SLEEP 30 capsule 2  . traMADol (ULTRAM) 50 MG tablet Take 1 tablet (50 mg total) by mouth every 12 (twelve) hours as needed for moderate pain or severe pain. 30 tablet 2  . VALERIAN ROOT PO Take 1 capsule by mouth at bedtime.     Marland Kitchen atorvastatin (LIPITOR) 40 MG tablet Take 1 tablet (40 mg total) by mouth daily. 90 tablet 1   No current facility-administered medications for this visit.     REVIEW OF SYSTEMS:  '[X]'$  denotes positive finding, '[ ]'$  denotes negative finding Cardiac  Comments:  Chest pain or chest pressure:    Shortness of breath upon exertion:    Short of breath when lying flat:    Irregular heart rhythm:        Vascular    Pain in calf, thigh, or hip brought on by ambulation: x   Pain in feet at night that wakes you up from your sleep:     Blood clot in your veins:    Leg swelling:         Pulmonary    Oxygen at home:    Productive cough:     Wheezing:         Neurologic    Sudden weakness in arms or legs:     Sudden numbness in arms or legs:     Sudden onset of difficulty speaking or slurred speech:      Temporary loss of vision in one eye:     Problems with dizziness:         Gastrointestinal    Blood in stool:     Vomited blood:         Genitourinary    Burning when urinating:     Blood in urine:        Psychiatric    Major depression:         Hematologic    Bleeding problems:    Problems with blood clotting too easily:        Skin    Rashes or ulcers:        Constitutional    Fever or chills:     PHYSICAL EXAM:   Vitals:   07/05/17 1344  BP: 116/80  Pulse: 65  Resp: 16  Temp: (!) 97.4 F (36.3 C)  TempSrc: Oral  SpO2: 95%  Weight: 212 lb 12.8 oz (96.5 kg)  Height: '5\' 10"'$  (1.778 m)    GENERAL: The patient is a well-nourished male, in no acute distress. The vital signs are documented above. CARDIAC: There is a regular rate and rhythm.  VASCULAR: I do not detect carotid bruits. On the right side he has a palpable femoral, popliteal, dorsalis pedis, and posterior tibial pulse. On the left side he has a palpable femoral, popliteal, and posterior tibial pulse.  I cannot palpate a dorsalis pedis pulse. PULMONARY: There is  good air exchange bilaterally without wheezing or rales. ABDOMEN: Soft and non-tender with normal pitched bowel sounds.  I do not palpate an abdominal aortic aneurysm.  However it is difficult to assess because of his size. MUSCULOSKELETAL: There are no major deformities or cyanosis. NEUROLOGIC: No focal weakness or paresthesias are detected. SKIN: There are no ulcers or rashes noted. PSYCHIATRIC: The patient has a normal affect.  DATA:    LOWER EXTREMITY ARTERIAL DOPPLER: I have independently interpreted the lower extremity arterial Doppler study today.  On the right side there is a triphasic dorsalis pedis and posterior tibial signal.  ABI is 100%.  Toe pressure on the right is 81 mmHg.  On the left side there is a triphasic dorsalis pedis and posterior tibial signal.  ABI is 100%.  Toe pressure on the left is 74 mmHg.  VENOUS DUPLEX: This  patient did have a venous duplex scan at [facility] on 03/28/2017.  This was at the right lower extremity and showed no evidence of DVT of the right lower extremity.  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  I looked at this myself with the [employer] in the office and he does appear to have an aneurysm of the proximal superficial femoral artery.  On my limited exam he also appeared to have some laminated thrombus within this.  I think this aneurysm is concerning and that it puts him at risk for thrombosis or embolization.  I think the next logical step to work this up would be a formal right lower extremity arterial duplex.  In addition, given that he has a superficial femoral artery aneurysm I think he should be screened for an abdominal aortic aneurysm.  It is difficult to assess on exam because of his size.  I have scheduled a formal duplex of his abdominal aorta and right lower extremity we will discuss this with him after.  We have discussed the importance of tobacco cessation.  CHRONIC VENOUS INSUFFICIENCY: Based on his exam he does have evidence of significant chronic venous insufficiency especially in the right leg.  We have discussed the importance of some lifestyle management changes to stay on top of this as this will be a chronic problem.  I have encouraged him to elevate his legs daily and we discussed the proper positioning for this.  He does have compression stockings.  I have encouraged him to avoid prolonged sitting and standing.  We also discussed exercise and weight management.  If this progresses then certainly we could consider a more aggressive venous work-up including a reflux study however currently  the symptoms are quite tolerable with respect to his venous disease.  Deitra Mayo [facility] [phone]

## 2017-07-08 ENCOUNTER — Other Ambulatory Visit: Payer: Self-pay

## 2017-07-08 DIAGNOSIS — I724 Aneurysm of artery of lower extremity: Secondary | ICD-10-CM

## 2017-07-08 DIAGNOSIS — I739 Peripheral vascular disease, unspecified: Secondary | ICD-10-CM

## 2017-07-15 ENCOUNTER — Encounter: Payer: Self-pay | Admitting: Family

## 2017-07-15 ENCOUNTER — Ambulatory Visit (INDEPENDENT_AMBULATORY_CARE_PROVIDER_SITE_OTHER): Payer: Medicare HMO | Admitting: Family

## 2017-07-15 VITALS — BP 115/76 | HR 94 | Temp 97.7°F | Ht 70.0 in | Wt 210.8 lb

## 2017-07-15 DIAGNOSIS — M79605 Pain in left leg: Secondary | ICD-10-CM

## 2017-07-15 DIAGNOSIS — R6889 Other general symptoms and signs: Secondary | ICD-10-CM | POA: Diagnosis not present

## 2017-07-15 DIAGNOSIS — E669 Obesity, unspecified: Secondary | ICD-10-CM | POA: Diagnosis not present

## 2017-07-15 DIAGNOSIS — M792 Neuralgia and neuritis, unspecified: Secondary | ICD-10-CM | POA: Diagnosis not present

## 2017-07-15 DIAGNOSIS — Z720 Tobacco use: Secondary | ICD-10-CM

## 2017-07-15 MED ORDER — GABAPENTIN 100 MG PO CAPS: 100.0000 mg | ORAL_CAPSULE | Freq: Three times a day (TID) | ORAL | 3 refills | Status: DC

## 2017-07-15 NOTE — Patient Instructions (Signed)
Peripheral Neuropathy Peripheral neuropathy is a type of nerve damage. It affects nerves that carry signals between the spinal cord and other parts of the body. These are called peripheral nerves. With peripheral neuropathy, one nerve or a group of nerves may be damaged. What are the causes? Many things can damage peripheral nerves. For some people with peripheral neuropathy, the cause is unknown. Some causes include:  Diabetes. This is the most common cause of peripheral neuropathy.  Injury to a nerve.  Pressure or stress on a nerve that lasts a long time.  Too little vitamin B. Alcoholism can lead to this.  Infections.  Autoimmune diseases, such as multiple sclerosis and systemic lupus erythematosus.  Inherited nerve diseases.  Some medicines, such as cancer drugs.  Toxic substances, such as lead and mercury.  Too little blood flowing to the legs.  Kidney disease.  Thyroid disease.  What are the signs or symptoms? Different people have different symptoms. The symptoms you have will depend on which of your nerves is damaged. Common symptoms include:  Loss of feeling (numbness) in the feet and hands.  Tingling in the feet and hands.  Pain that burns.  Very sensitive skin.  Weakness.  Not being able to move a part of the body (paralysis).  Muscle twitching.  Clumsiness or poor coordination.  Loss of balance.  Not being able to control your bladder.  Feeling dizzy.  Sexual problems.  How is this diagnosed? Peripheral neuropathy is a symptom, not a disease. Finding the cause of peripheral neuropathy can be hard. To figure that out, your health care provider will take a medical history and do a physical exam. A neurological exam will also be done. This involves checking things affected by your brain, spinal cord, and nerves (nervous system). For example, your health care provider will check your reflexes, how you move, and what you can feel. Other types of tests  may also be ordered, such as:  Blood tests.  A test of the fluid in your spinal cord.  Imaging tests, such as CT scans or an MRI.  Electromyography (EMG). This test checks the nerves that control muscles.  Nerve conduction velocity tests. These tests check how fast messages pass through your nerves.  Nerve biopsy. A small piece of nerve is removed. It is then checked under a microscope.  How is this treated?  Medicine is often used to treat peripheral neuropathy. Medicines may include: ? Pain-relieving medicines. Prescription or over-the-counter medicine may be suggested. ? Antiseizure medicine. This may be used for pain. ? Antidepressants. These also may help ease pain from neuropathy. ? Lidocaine. This is a numbing medicine. You might wear a patch or be given a shot. ? Mexiletine. This medicine is typically used to help control irregular heart rhythms.  Surgery. Surgery may be needed to relieve pressure on a nerve or to destroy a nerve that is causing pain.  Physical therapy to help movement.  Assistive devices to help movement. Follow these instructions at home:  Only take over-the-counter or prescription medicines as directed by your health care provider. Follow the instructions carefully for any given medicines. Do not take any other medicines without first getting approval from your health care provider.  If you have diabetes, work closely with your health care provider to keep your blood sugar under control.  If you have numbness in your feet: ? Check every day for signs of injury or infection. Watch for redness, warmth, and swelling. ? Wear padded socks and comfortable   shoes. These help protect your feet.  Do not do things that put pressure on your damaged nerve.  Do not smoke. Smoking keeps blood from getting to damaged nerves.  Avoid or limit alcohol. Too much alcohol can cause a lack of B vitamins. These vitamins are needed for healthy nerves.  Develop a good  support system. Coping with peripheral neuropathy can be stressful. Talk to a mental health specialist or join a support group if you are struggling.  Follow up with your health care provider as directed. Contact a health care provider if:  You have new signs or symptoms of peripheral neuropathy.  You are struggling emotionally from dealing with peripheral neuropathy.  You have a fever. Get help right away if:  You have an injury or infection that is not healing.  You feel very dizzy or begin vomiting.  You have chest pain.  You have trouble breathing. This information is not intended to replace advice given to you by your health care provider. Make sure you discuss any questions you have with your health care provider. Document Released: 12/15/2001 Document Revised: 06/02/2015 Document Reviewed: 09/01/2012 Elsevier Interactive Patient Education  2017 [employer] Inc[employer]  

## 2017-07-15 NOTE — Progress Notes (Signed)
   Subjective:    Patient ID: Anthony Form., male    DOB: [birth year], 67 y.o.   MRN: [id number]  Chief Complaint  Patient presents with  . left leg pain   PT presents to the office today with chronic left lower leg pain. Pt has seen Vascular since our last appointment. PT states by the evening he has pain of 8-9 out 10.  Leg Pain   The incident occurred more than 1 week ago. The pain is present in the left leg and left thigh. The quality of the pain is described as aching and burning. The pain is mild. The pain has been intermittent since onset. Pertinent negatives include no numbness or tingling. He reports no foreign bodies present. The symptoms are aggravated by weight bearing. He has tried acetaminophen for the symptoms. The treatment provided mild relief.      Review of Systems  Neurological: Negative for tingling and numbness.  All other systems reviewed and are negative.      Objective:   Physical Exam  Constitutional: He is oriented to person, place, and time. He appears well-developed and well-nourished. No distress.  HENT:  Head: Normocephalic.  Eyes: Pupils are equal, round, and reactive to light. Right eye exhibits no discharge. Left eye exhibits no discharge.  Neck: Normal range of motion. Neck supple. No thyromegaly present.  Cardiovascular: Normal rate, regular rhythm, normal heart sounds and intact distal pulses.  No murmur heard. Pulmonary/Chest: Effort normal and breath sounds normal. No respiratory distress. He has no wheezes.  Abdominal: Soft. Bowel sounds are normal. He exhibits no distension. There is no tenderness.  Musculoskeletal: Normal range of motion. He exhibits no edema or tenderness.  Neurological: He is alert and oriented to person, place, and time. He has normal reflexes. No cranial nerve deficit.  Skin: Skin is warm and dry. No rash noted. No erythema.  Psychiatric: He has a normal mood and affect. His behavior is normal. Judgment and thought  content normal.  Vitals reviewed.     BP 115/76   Pulse 94   Temp 97.7 F (36.5 C) (Oral)   Ht 5\' 10"  (1.778 m)   Wt 210 lb 12.8 oz (95.6 kg)   BMI 30.25 kg/m      Assessment & Plan:  Anthony [name] was seen today for left leg pain.  Diagnoses and all orders for this visit:  Left leg pain -     gabapentin (NEURONTIN) 100 MG capsule; Take 1 capsule (100 mg total) by mouth 3 (three) times daily. -     Vitamin B12  Obesity (BMI 30-39.9)  Tobacco abuse  Neuropathic pain -     Vitamin B12  Keep follow up appt with Vascular  Smoking cessation  Compression hose Start gabapentin, start 100 mg at bedtime, then day 2 increase to BID, then day 3 increase to TID. Discussed this may cause drowsiness   [name], FNP

## 2017-07-16 LAB — VITAMIN B12: Vitamin B-12: 853 pg/mL (ref 232–1245)

## 2017-07-29 ENCOUNTER — Other Ambulatory Visit: Payer: Self-pay | Admitting: Family

## 2017-07-29 DIAGNOSIS — M159 Polyosteoarthritis, unspecified: Secondary | ICD-10-CM

## 2017-07-29 DIAGNOSIS — M150 Primary generalized (osteo)arthritis: Principal | ICD-10-CM

## 2017-08-01 ENCOUNTER — Ambulatory Visit (INDEPENDENT_AMBULATORY_CARE_PROVIDER_SITE_OTHER): Payer: Medicare HMO

## 2017-08-01 ENCOUNTER — Ambulatory Visit (INDEPENDENT_AMBULATORY_CARE_PROVIDER_SITE_OTHER): Payer: Medicare HMO | Admitting: Family

## 2017-08-01 ENCOUNTER — Encounter: Payer: Self-pay | Admitting: Family

## 2017-08-01 VITALS — BP 112/70 | HR 70 | Temp 97.2°F | Ht 70.0 in | Wt 211.0 lb

## 2017-08-01 DIAGNOSIS — M159 Polyosteoarthritis, unspecified: Secondary | ICD-10-CM

## 2017-08-01 DIAGNOSIS — M25552 Pain in left hip: Secondary | ICD-10-CM | POA: Diagnosis not present

## 2017-08-01 DIAGNOSIS — M79605 Pain in left leg: Secondary | ICD-10-CM

## 2017-08-01 DIAGNOSIS — Z720 Tobacco use: Secondary | ICD-10-CM | POA: Diagnosis not present

## 2017-08-01 MED ORDER — DICLOFENAC SODIUM 75 MG PO TBEC: 75.0000 mg | DELAYED_RELEASE_TABLET | Freq: Two times a day (BID) | ORAL | 0 refills | Status: DC

## 2017-08-01 NOTE — Patient Instructions (Signed)

## 2017-08-01 NOTE — Progress Notes (Signed)
   Subjective:    Patient ID: Anthony Form., male    DOB: 03/17/1950, 67 y.o.   MRN: [id number]  Chief Complaint  Patient presents with  . Leg Pain    HPI PT presents to the office today with chronic left lower leg pain. Pt is followed by Vascular who have him scheduled [place] lower extremity arterial duplex. PT states by night  he has pain of 8-9 out 10. He was started on gabapentin 100 mg TID. States he has not been able to tell a difference on it.   States the pain is worse. He had negative DVT 03/28/17, but had a DVT approx 2002. He is no longer on anticoagulation.  He reports pain on dorsum foot, lateral ankle, left calf, and anterior thigh.    Review of Systems  All other systems reviewed and are negative.      Objective:   Physical Exam  Constitutional: He is oriented to person, place, and time. He appears well-developed and well-nourished. No distress.  HENT:  Head: Normocephalic.  Eyes: Pupils are equal, round, and reactive to light. Right eye exhibits no discharge. Left eye exhibits no discharge.  Neck: Normal range of motion. Neck supple. No thyromegaly present.  Cardiovascular: Normal rate, regular rhythm, normal heart sounds and intact distal pulses.  No murmur heard. Pulmonary/Chest: Effort normal and breath sounds normal. No respiratory distress. He has no wheezes.  Abdominal: Soft. Bowel sounds are normal. He exhibits no distension. There is no tenderness.  Musculoskeletal: Normal range of motion. He exhibits no edema or tenderness.  Negative Homan's  sign   Neurological: He is alert and oriented to person, place, and time. He has normal reflexes. No cranial nerve deficit.  Skin: Skin is warm and dry. No rash noted. No erythema.  Psychiatric: He has a normal mood and affect. His behavior is normal. Judgment and thought content normal.  Vitals reviewed.     BP 112/70   Pulse 70   Temp (!) 97.2 F (36.2 C) (Oral)   Ht 5\' 10"  (1.778 m)   Wt 211 lb (95.7 kg)    BMI 30.28 kg/m      Assessment & Plan:  Digby was seen today for leg pain.  Diagnoses and all orders for this visit:  Pain of left lower extremity -     DG HIP UNILAT W OR W/O PELVIS 2-3 VIEWS LEFT; Future -     diclofenac (VOLTAREN) 75 MG EC tablet; Take 1 tablet (75 mg total) by mouth 2 (two) times daily.  Tobacco abuse  Osteoarthritis of multiple joints, unspecified osteoarthritis type -     DG HIP UNILAT W OR W/O PELVIS 2-3 VIEWS LEFT; Future -     diclofenac (VOLTAREN) 75 MG EC tablet; Take 1 tablet (75 mg total) by mouth 2 (two) times daily.   Calf negative for swelling or tenderness present With pain through out leg, this could be more joint related. Will stop Gabapentin and start Diclofenac today Keep follow up with Vascular  He may need referral to Ortho or PT if pain does not improve  [name], FNP

## 2017-08-08 ENCOUNTER — Telehealth: Payer: Self-pay

## 2017-08-08 DIAGNOSIS — M79605 Pain in left leg: Secondary | ICD-10-CM

## 2017-08-08 NOTE — Telephone Encounter (Signed)
Wants a referral for leg pain

## 2017-08-09 NOTE — Telephone Encounter (Signed)
Patient aware of results and that this office should contact him within a week with appt

## 2017-08-09 NOTE — Telephone Encounter (Signed)
We will do referral to Ortho and physical therapy

## 2017-08-12 ENCOUNTER — Ambulatory Visit: Payer: Medicare HMO | Admitting: Rehabilitative and Restorative Service Providers"

## 2017-08-12 ENCOUNTER — Encounter: Payer: Self-pay | Admitting: Rehabilitative and Restorative Service Providers"

## 2017-08-12 DIAGNOSIS — M6281 Muscle weakness (generalized): Secondary | ICD-10-CM | POA: Diagnosis not present

## 2017-08-12 DIAGNOSIS — M79605 Pain in left leg: Secondary | ICD-10-CM | POA: Diagnosis not present

## 2017-08-12 DIAGNOSIS — R29898 Other symptoms and signs involving the musculoskeletal system: Secondary | ICD-10-CM

## 2017-08-12 NOTE — Therapy (Signed)
[place], [place], [address] Phone: [phone]   Fax:  [phone]  Physical Therapy Evaluation  Patient Details  Name: Anthony [name]. MRN: [id number] Date of Birth: [birth year] Referring Provider: [name], FN-P   Encounter Date: 08/12/2017  PT End of Session - 08/12/17 1515    Visit Number  1    Number of Visits  12    Date for PT Re-Evaluation  09/23/17    PT Start Time  1410    PT Stop Time  1517    PT Time Calculation (min)  67 min    Activity Tolerance  Patient tolerated treatment well       Past Medical History:  Diagnosis Date  . Anxiety   . Arthritis   . Bipolar disorder ([place])   . CAD (coronary artery disease)    a. INF STEMI 07/04/10:  tx with thrombectomy + Vision BMS to [facility];  cath 07/04/10: dLM 10-20%, pLAD 40-50%, mLAD 20-30%, pRCA 30%, mRCA occluded and tx with PCI, EF 50% with inf HK. A Multilink  . COPD (chronic obstructive pulmonary disease) ([place])   . Depression   . Glucose intolerance (impaired glucose tolerance)    A1c 6.2 06/2010  . History of DVT (deep vein thrombosis)    traumatic, s/p coumadin tx.  Marland Kitchen HLD (hyperlipidemia)   . Hypertension    pt states is currently on no medications  . Myocardial infarction ([place])    around 2013  . Shortness of breath    walking distance or climbing stairs  . Tobacco abuse   . Urinary frequency     Past Surgical History:  Procedure Laterality Date  . CARDIAC CATHETERIZATION    . COLONOSCOPY N/A 12/29/2015   Procedure: COLONOSCOPY;  Surgeon: Rogene Houston, MD;  [facility];  Service: Endoscopy;  Laterality: N/A;  12:55  . ELECTROCARDIOGRAM     Showed inferolateral ST-elevation and a code STEMI was activated. In the ER, he was treated with morphine, herarin, and 600 mg of Plavix. He was transferred emergently to [facility].   . INGUINAL HERNIA REPAIR Left 07/26/2014   Procedure: OPEN REPAIR OF  RECURENT LEFT INGUINAL HERNIA REPAIR WITH MESH;  Surgeon: Greer Pickerel, MD;  [facility]: [facility];  Service: General;  Laterality: Left;  . INSERTION OF MESH Bilateral 10/30/2013   Procedure: INSERTION OF MESH;  Surgeon: Gayland Curry, MD;  [facility];  Service: General;  Laterality: Bilateral;  . LAPAROSCOPIC INGUINAL HERNIA WITH UMBILICAL HERNIA Bilateral 10/30/2013   Procedure: laparoscopic repair left pantaloom hernia with mesh, laparoscopic right inguinal hernia with mesh, OPEN REPAIR OF UMBILICAL HERNIA ;  Surgeon: Gayland Curry, MD;  [facility];  Service: General;  Laterality: Bilateral;  . LAPAROSCOPY N/A 07/26/2014   Procedure: LAPAROSCOPY DIAGNOSTIC;  Surgeon: Greer Pickerel, MD;  [facility];  Service: General;  Laterality: N/A;  . POLYPECTOMY  12/29/2015   Procedure: POLYPECTOMY;  Surgeon: Rogene Houston, MD;  [facility];  Service: Endoscopy;;  ascending colon, descending colon  . stent placement    . TOTAL HIP ARTHROPLASTY Right 05/10/2015   Procedure: RIGHT TOTAL HIP ARTHROPLASTY ANTERIOR APPROACH;  Surgeon: Paralee Cancel, MD;  [facility];  Service: Orthopedics;  Laterality: Right;  Marland Kitchen VASECTOMY      There were no vitals filed for this visit.   Subjective Assessment - 08/12/17 1415    Subjective  Patient reports that  he has had Lt LE pain over the past 6 months with significant increase in the symptoms in the past 2-3 months with no known injury.     Pertinent History  AODM; MI; hernia repair x 2 most recent ~ 1 1/2 yr ago; arthritis; Rt THA 1-2 yrs ago(unsure of date); DVT 2002      Diagnostic tests  hip xray - WNL's; vascular study 03/28/17 (-)     Patient Stated Goals  get rid of the pain     Currently in Pain?  Yes    Pain Score  3     Pain Location  Leg    Pain Orientation  Left    Pain Descriptors / Indicators  Sharp    Pain Type  Chronic pain    Pain Radiating Towards  anterior thigh; calf into the dorsum of the Lt foot     Pain Onset  More  than a month ago    Pain Frequency  Intermittent    Aggravating Factors   worse as the day goes on; walking for any distance     Pain Relieving Factors  rest; ice; topical analgesic; squatting down          [facility] PT Assessment - 08/12/17 0001      Assessment   Medical Diagnosis  Lt LE pain     Referring Provider  [name], FN-P    Onset Date/Surgical Date  02/08/17 increased symptoms in the past 2 months     Hand Dominance  Right    Next MD Visit  PRN     Prior Therapy  none      Precautions   Precautions  None      Restrictions   Weight Bearing Restrictions  No      Balance Screen   Has the patient fallen in the past 6 months  No    Has the patient had a decrease in activity level because of a fear of falling?   No    Is the patient reluctant to leave their home because of a fear of falling?   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     Leisure  some carpentry; otherwise sedentary       Observation/Other Assessments   Focus on Therapeutic Outcomes (FOTO)   52% limitation       Sensation   Additional Comments  intermittent numbness or tingling when standing or moving from sedentary position       Posture/Postural Control   Posture Comments  head forward; shoudlers rounded; decreased lumbar lordosis       AROM   Right/Left Hip  -- WFL's some tightness end ranges     Right/Left Knee  -- WFL's bilat     Right Ankle Dorsiflexion  13    Left Ankle Dorsiflexion  6    Lumbar Flexion  75%    Lumbar Extension  30% discomfort Lt calf     Lumbar - Right Side Bend  90%    Lumbar - Left Side Bend  85%    Lumbar - Right Rotation  45%    Lumbar - Left Rotation  50%      Strength   Overall Strength Comments  5/5 bilat LE's     Strength Assessment Site  -- some decreased strength Lt LE as noted     Left Hip Flexion  4+/5    Left Hip Extension  4+/5    Left Hip ABduction  4+/5    Left Knee  Extension  4+/5    Left Ankle Dorsiflexion  4+/5      Flexibility   Hamstrings  tight bilat ~ 70 deg Rt; 75 deg Lt     Quadriceps  tight Rt > Lt     ITB  tight Rt > lt     Levator Ani  tigth bilat       Palpation   Spinal mobility  hypomobile lumbar spine with CPA mobs     Palpation comment  muscular tightness through the lumbar paraspinals; anterior thigh/quads and hip flexors; lateral calf; lateral ankle       Ambulation/Gait   Gait Comments  wide based gait                 Objective measurements completed on examination: See above findings.      [facility] Adult PT Treatment/Exercise - 08/12/17 0001      Knee/Hip Exercises: Stretches   Passive Hamstring Stretch  Left;2 reps;30 seconds supine with strap     Gastroc Stretch  Left;3 reps;30 seconds    Soleus Stretch  Left;3 reps;30 seconds      Knee/Hip Exercises: Supine   Other Supine Knee/Hip Exercises  engaging core - belly button to back bone 10 sec x 10 reps       Moist Heat Therapy   Number Minutes Moist Heat  20 Minutes    Moist Heat Location  Hip anterior lateral thigh; lateral leg to ankle       Electrical Stimulation   Electrical Stimulation Location  Lt lateral ankle; calf; thigh     Electrical Stimulation Action  IFC    Electrical Stimulation Parameters  to tolerance    Electrical Stimulation Goals  Pain;Tone             PT Education - 08/12/17 1515    Education Details  HEP POC TENS     Person(s) Educated  Patient    Methods  Explanation;Demonstration;Tactile cues;Verbal cues;Handout    Comprehension  Verbalized understanding;Returned demonstration;Verbal cues required;Tactile cues required          PT Long Term Goals - 08/12/17 1527      PT LONG TERM GOAL #1   Title  Improve LE moblity and ROM with patient to demonstrate increased Lt ankle DF to =/> 10 deg 09/23/17    Time  6    Period  Weeks    Status  New      PT LONG TERM GOAL #2   Title  Decrease Lt LE pain by 50-75% allowing  patient to perform normal functional activities with minimal pain or discomfort 09/23/17    Time  6    Period  Weeks    Status  New      PT LONG TERM GOAL #3   Title  Independent in HEP 09/23/17    Time  6    Period  Weeks    Status  New      PT LONG TERM GOAL #4   Title  Improve FOTO to </= 44% limitation 09/23/17    Time  6    Period  Weeks    Status  New             Plan - 08/12/17 1519    Clinical Impression Statement  Ziair presents with 5-6 month history of Lt LE pain with no  known injury. He reports increased painin the past 2-3 months with symptoms worse toward the end of the day and with standing.  Patient has poor posture and alignment; limited trunk and LE ROM/mobilty; decreased strength Lt LE;; tenderness to palpation through the Lt posteriorlateral malleolus area; lateral calf; anteriorlateral thigh. Patient's strength si WNL's. He has some intermittent tingling in the Lt LE related to prolonged standing. Symptoms may be related to lumbar dysfunction even though he has no central symptoms. Current symptoms do not seem vascular in nature nor do they seem related to the DM. Hughes will benefit from PT to further evaluate and address current symptoms as described.     History and Personal Factors relevant to plan of care:  Rt THA; unsuccessful hernia repair surgery x 2; AODM; sedentary lifestyle; poor posture/alignment     Clinical Presentation  Stable    Clinical Decision Making  Low    Rehab Potential  Good    PT Frequency  2x / week    PT Duration  6 weeks    PT Treatment/Interventions  Patient/family education;ADLs/Self Care Home Management;Cryotherapy;Electrical Stimulation;Iontophoresis 4mg /ml Dexamethasone;Moist Heat;Ultrasound;Dry needling;Manual techniques;Neuromuscular re-education;Therapeutic activities;Therapeutic exercise    PT Next Visit Plan  review HEP and assess response to initial intervention; further assessment of Lt LE pain vs lumbar origin of pain; core  stabilization; strengthening; manual work and modalities as indicated - consider manual work along lateral chain Lt LE     Consulted and Agree with Plan of Care  Patient       Patient will benefit from skilled therapeutic intervention in order to improve the following deficits and impairments:  Postural dysfunction, Improper body mechanics, Pain, Increased fascial restricitons, Increased muscle spasms, Hypomobility, Decreased mobility, Decreased range of motion, Decreased activity tolerance  Visit Diagnosis: Pain in left leg - Plan: PT plan of care cert/re-cert  Other symptoms and signs involving the musculoskeletal system - Plan: PT plan of care cert/re-cert  Muscle weakness (generalized) - Plan: PT plan of care cert/re-cert     Problem List Patient Active Problem List   Diagnosis Date Noted  . Insomnia 03/26/2017  . Aortic atherosclerosis ([place]) 10/19/2016  . Pain medication agreement signed 06/05/2016  . Opioid dependence ([place]) 06/05/2016  . Diabetes mellitus without complication ([place]) 29/56/2130  . Obesity (BMI 30-39.9) 04/13/2016  . Rectal bleeding 11/23/2015  . Moderate episode of recurrent major depressive disorder ([place]) 10/18/2015  . S/P right THA, AA 05/10/2015  . Recurrent left inguinal hernia 07/26/2014  . Chronic right hip pain 06/23/2014  . S/P bilateral inguinal hernia repair 10/30/2013  . Nodule of left lung 05/29/2013  . Bilateral inguinal hernia 05/28/2013  . Umbilical hernia 86/57/8469  . Major psychotic depression, recurrent ([place]) 12/16/2012  . Inadequate anticoagulation 04/29/2012  . Hypocalcemia 07/28/2010  . CAD (coronary artery disease)   . Hyperlipidemia associated with type 2 diabetes mellitus ([place])   . COPD (chronic obstructive pulmonary disease) ([place])   . Tobacco abuse     Kyrra Prada Nilda Simmer PT, MPH  08/12/2017, 3:44 PM  [facility], [place], [address] Phone: [phone]    Fax:  [phone]  Name: Anthony [name]. MRN: [id number] Date of Birth: May 22, 1950

## 2017-08-12 NOTE — Patient Instructions (Signed)
Gastroc Stretch    Stand with right foot back, leg straight, forward leg bent. Keeping heel on floor, turned slightly out, lean into wall until stretch is felt in calf. Hold __30__ seconds. Repeat __3__ times per set.  Do __2_ sessions per day.   Achilles / Soleus, Standing    Stand, right foot behind, heel on floor and turned slightly out. Lower hips and bend knees. Hold __30_ seconds. Repeat _3__ times per session. Do __2_ sessions per day.  HIP: Hamstrings - Supine  Place strap around foot. Raise leg up, keeping knee straight.  Bend opposite knee to protect back if indicated. Hold 30 seconds. 3 reps per set, 2-3 sets per day  Abdominal Bracing With Pelvic Floor (Hook-Lying)    With neutral spine, tighten pelvic floor and abdominals. Hold 10 sec Repeat _10__ times. Do _several __ times a day. Progress to do this in sitting, standing, walking and with functional activities    TENS UNIT: This is helpful for muscle pain and spasm.   Search and Purchase a TENS 7000 2nd edition at www.tenspros.com. It should be less than $30.     TENS unit instructions: Do not shower or bathe with the unit on Turn the unit off before removing electrodes or batteries If the electrodes lose stickiness add a drop of water to the electrodes after they are disconnected from the unit and place on plastic sheet. If you continued to have difficulty, call the TENS unit company to purchase more electrodes. Do not apply lotion on the skin area prior to use. Make sure the skin is clean and dry as this will help prolong the life of the electrodes. After use, always check skin for unusual red areas, rash or other skin difficulties. If there are any skin problems, does not apply electrodes to the same area. Never remove the electrodes from the unit by pulling the wires. Do not use the TENS unit or electrodes other than as directed. Do not change electrode placement without consultating your therapist or  physician. Keep 2 fingers with between each electrode.

## 2017-08-15 ENCOUNTER — Ambulatory Visit (INDEPENDENT_AMBULATORY_CARE_PROVIDER_SITE_OTHER): Payer: Medicare HMO | Admitting: Rehabilitative and Restorative Service Providers"

## 2017-08-15 ENCOUNTER — Encounter: Payer: Self-pay | Admitting: Rehabilitative and Restorative Service Providers"

## 2017-08-15 DIAGNOSIS — R29898 Other symptoms and signs involving the musculoskeletal system: Secondary | ICD-10-CM | POA: Diagnosis not present

## 2017-08-15 DIAGNOSIS — M79605 Pain in left leg: Secondary | ICD-10-CM

## 2017-08-15 DIAGNOSIS — M6281 Muscle weakness (generalized): Secondary | ICD-10-CM

## 2017-08-15 NOTE — Therapy (Addendum)
[place], [place], [address] Phone: [phone]   Fax:  [phone]  Physical Therapy Treatment  Patient Details  Name: Anthony [name]. MRN: [id number] Date of Birth: August 19, 1950 Referring Provider: [name], FN-P   Encounter Date: 08/15/2017  PT End of Session - 08/15/17 1451    Visit Number  2    Number of Visits  12    Date for PT Re-Evaluation  09/23/17    PT Start Time  1448    PT Stop Time  2683    PT Time Calculation (min)  56 min    Activity Tolerance  Patient tolerated treatment well       Past Medical History:  Diagnosis Date  . Anxiety   . Arthritis   . Bipolar disorder ([place])   . CAD (coronary artery disease)    a. INF STEMI 07/04/10:  tx with thrombectomy + Vision BMS to [facility];  cath 07/04/10: dLM 10-20%, pLAD 40-50%, mLAD 20-30%, pRCA 30%, mRCA occluded and tx with PCI, EF 50% with inf HK. A Multilink  . COPD (chronic obstructive pulmonary disease) ([place])   . Depression   . Glucose intolerance (impaired glucose tolerance)    A1c 6.2 06/2010  . History of DVT (deep vein thrombosis)    traumatic, s/p coumadin tx.  Marland Kitchen HLD (hyperlipidemia)   . Hypertension    pt states is currently on no medications  . Myocardial infarction ([place])    around 2013  . Shortness of breath    walking distance or climbing stairs  . Tobacco abuse   . Urinary frequency     Past Surgical History:  Procedure Laterality Date  . CARDIAC CATHETERIZATION    . COLONOSCOPY N/A 12/29/2015   Procedure: COLONOSCOPY;  Surgeon: Rogene Houston, MD;  [facility];  Service: Endoscopy;  Laterality: N/A;  12:55  . ELECTROCARDIOGRAM     Showed inferolateral ST-elevation and a code STEMI was activated. In the ER, he was treated with morphine, herarin, and 600 mg of Plavix. He was transferred emergently to [facility].   . INGUINAL HERNIA REPAIR Left 07/26/2014   Procedure: OPEN REPAIR OF RECURENT  LEFT INGUINAL HERNIA REPAIR WITH MESH;  Surgeon: Greer Pickerel, MD;  [facility];  Service: General;  Laterality: Left;  . INSERTION OF MESH Bilateral 10/30/2013   Procedure: INSERTION OF MESH;  Surgeon: Gayland Curry, MD;  [facility];  Service: General;  Laterality: Bilateral;  . LAPAROSCOPIC INGUINAL HERNIA WITH UMBILICAL HERNIA Bilateral 10/30/2013   Procedure: laparoscopic repair left pantaloom hernia with mesh, laparoscopic right inguinal hernia with mesh, OPEN REPAIR OF UMBILICAL HERNIA ;  Surgeon: Gayland Curry, MD;  [facility];  Service: General;  Laterality: Bilateral;  . LAPAROSCOPY N/A 07/26/2014   Procedure: LAPAROSCOPY DIAGNOSTIC;  Surgeon: Greer Pickerel, MD;  [facility];  Service: General;  Laterality: N/A;  . POLYPECTOMY  12/29/2015   Procedure: POLYPECTOMY;  Surgeon: Rogene Houston, MD;  [facility];  Service: Endoscopy;;  ascending colon, descending colon  . stent placement    . TOTAL HIP ARTHROPLASTY Right 05/10/2015   Procedure: RIGHT TOTAL HIP ARTHROPLASTY ANTERIOR APPROACH;  Surgeon: Paralee Cancel, MD;  [facility];  Service: Orthopedics;  Laterality: Right;  Marland Kitchen VASECTOMY      There were no vitals filed for this visit.  Subjective Assessment - 08/15/17 1452    Subjective  Patient reports that he  was pain free in the Lt LE for about 30-40 min following initial treatment. Pain is back but not as intense. He was doing some shoppig today and was pain free for an hour or two.  Pain is less intense.     Currently in Pain?  Yes    Pain Score  1     Pain Location  Leg    Pain Orientation  Left;Lateral    Pain Descriptors / Indicators  Sharp    Pain Type  Chronic pain    Pain Radiating Towards  anterior thigh; calf to dorsum of Lt foot     Pain Onset  More than a month ago                       [facility] Adult PT Treatment/Exercise - 08/15/17 0001      Knee/Hip Exercises: Stretches   Passive Hamstring Stretch  Left;2 reps;30  seconds   supine with strap    Piriformis Stretch  Left;3 reps;30 seconds   supine travell    Gastroc Stretch  Left;3 reps;30 seconds    Soleus Stretch  Left;3 reps;30 seconds      Knee/Hip Exercises: Supine   Bridges  Strengthening;Right;Left;1 set;10 reps   5 sec hold core engaged    Other Supine Knee/Hip Exercises  engaging core - belly button to back bone 10 sec x 10 reps     Other Supine Knee/Hip Exercises  in hook lying core tight - UE's 90 deg shd flexion; bringing one arm overhead then the opposite x 10 each       Moist Heat Therapy   Number Minutes Moist Heat  20 Minutes    Moist Heat Location  Hip   anterior lateral thigh; lateral leg to ankle      Electrical Stimulation   Electrical Stimulation Location  Lt lateral ankle; calf; thigh     Electrical Stimulation Action  IFC    Electrical Stimulation Parameters  to tolerance    Electrical Stimulation Goals  Pain;Tone      Manual Therapy   Manual therapy comments  patient supine     Soft tissue mobilization  soft tissue work through the lateral Lt ankle, leg, thigh              PT Education - 08/15/17 1541    Education Details  HEP     Person(s) Educated  Patient    Methods  Explanation;Demonstration;Verbal cues;Handout    Comprehension  Verbalized understanding;Returned demonstration;Verbal cues required;Tactile cues required          PT Long Term Goals - 08/12/17 1527      PT LONG TERM GOAL #1   Title  Improve LE moblity and ROM with patient to demonstrate increased Lt ankle DF to =/> 10 deg 09/23/17    Time  6    Period  Weeks    Status  New      PT LONG TERM GOAL #2   Title  Decrease Lt LE pain by 50-75% allowing patient to perform normal functional activities with minimal pain or discomfort 09/23/17    Time  6    Period  Weeks    Status  New      PT LONG TERM GOAL #3   Title  Independent in HEP 09/23/17    Time  6    Period  Weeks    Status  New      PT LONG TERM GOAL #4  Title  Improve  FOTO to </= 44% limitation 09/23/17    Time  6    Period  Weeks    Status  New            Plan - 08/15/17 1458    Clinical Impression Statement  Patient reports that pain has decreased and his activity level is increasing. He has less pain in the Lt LE. He has difficulty demonstrating the correct exercise technique for HEP. Responded well to initial treatment.     Rehab Potential  Good    PT Frequency  2x / week    PT Duration  6 weeks    PT Treatment/Interventions  Patient/family education;ADLs/Self Care Home Management;Cryotherapy;Electrical Stimulation;Iontophoresis 4mg /ml Dexamethasone;Moist Heat;Ultrasound;Dry needling;Manual techniques;Neuromuscular re-education;Therapeutic activities;Therapeutic exercise    PT Next Visit Plan  review HEP and assess response to initial intervention; further assessment of Lt LE pain vs lumbar origin of pain; core stabilization; strengthening; manual work and modalities as indicated - consider manual work along lateral chain Lt LE     Consulted and Agree with Plan of Care  Patient       Patient will benefit from skilled therapeutic intervention in order to improve the following deficits and impairments:  Postural dysfunction, Improper body mechanics, Pain, Increased fascial restricitons, Increased muscle spasms, Hypomobility, Decreased mobility, Decreased range of motion, Decreased activity tolerance  Visit Diagnosis: Pain in left leg  Other symptoms and signs involving the musculoskeletal system  Muscle weakness (generalized)     Problem List Patient Active Problem List   Diagnosis Date Noted  . Insomnia 03/26/2017  . Aortic atherosclerosis ([place]) 10/19/2016  . Pain medication agreement signed 06/05/2016  . Opioid dependence ([place]) 06/05/2016  . Diabetes mellitus without complication ([place]) 37/34/2876  . Obesity (BMI 30-39.9) 04/13/2016  . Rectal bleeding 11/23/2015  . Moderate episode of recurrent major depressive disorder ([place])  10/18/2015  . S/P right THA, AA 05/10/2015  . Recurrent left inguinal hernia 07/26/2014  . Chronic right hip pain 06/23/2014  . S/P bilateral inguinal hernia repair 10/30/2013  . Nodule of left lung 05/29/2013  . Bilateral inguinal hernia 05/28/2013  . Umbilical hernia 81/15/7262  . Major psychotic depression, recurrent ([place]) 12/16/2012  . Inadequate anticoagulation 04/29/2012  . Hypocalcemia 07/28/2010  . CAD (coronary artery disease)   . Hyperlipidemia associated with type 2 diabetes mellitus ([place])   . COPD (chronic obstructive pulmonary disease) ([place])   . Tobacco abuse     Brynne Doane Nilda Simmer PT, MPH  08/15/2017, 3:42 PM  [facility] [place], [place], [address] Phone: [phone]   Fax:  [phone]  Name: Anthony [name]. MRN: [id number] Date of Birth: 09-25-1950

## 2017-08-15 NOTE — Patient Instructions (Addendum)
   Piriformis Stretch   Lying on back, pull right knee toward opposite shoulder. Hold 30 seconds. Repeat 3 times. Do 2-3 sessions per day.  Extremity Flexion (Hook-Lying)    Tighten core and slowly lower right arm over head, return to start position, repeat with other arm.  Alternate arms. Keep trunk rigid. Repeat __10__ times on each arm per set. Do ____ sets per session. Do ____ sessions per day.   Bridging    Slowly raise buttocks from floor, keeping core tight. Hold 5 sed Repeat __10__ times per set. Do __1-2__ sets per session. Do __1__ sessions per day.

## 2017-08-19 ENCOUNTER — Ambulatory Visit (INDEPENDENT_AMBULATORY_CARE_PROVIDER_SITE_OTHER): Payer: Medicare HMO | Admitting: Physical Therapy

## 2017-08-19 DIAGNOSIS — R29898 Other symptoms and signs involving the musculoskeletal system: Secondary | ICD-10-CM | POA: Diagnosis not present

## 2017-08-19 DIAGNOSIS — M6281 Muscle weakness (generalized): Secondary | ICD-10-CM | POA: Diagnosis not present

## 2017-08-19 DIAGNOSIS — M79605 Pain in left leg: Secondary | ICD-10-CM

## 2017-08-19 NOTE — Therapy (Signed)
[place], [place], [address] Phone: [phone]   Fax:  [phone]  Physical Therapy Treatment  Patient Details  Name: Anthony [name]. MRN: [id number] Date of Birth: 04/21/1950 Referring Provider: [name], FN-P   Encounter Date: 08/19/2017  PT End of Session - 08/19/17 1435    Visit Number  3    Number of Visits  12    Date for PT Re-Evaluation  09/23/17    PT Start Time  9371    PT Stop Time  1530    PT Time Calculation (min)  55 min    Behavior During Therapy  [facility] for tasks assessed/performed       Past Medical History:  Diagnosis Date  . Anxiety   . Arthritis   . Bipolar disorder ([place])   . CAD (coronary artery disease)    a. INF STEMI 07/04/10:  tx with thrombectomy + [facility] BMS to [facility];  cath 07/04/10: dLM 10-20%, pLAD 40-50%, mLAD 20-30%, pRCA 30%, mRCA occluded and tx with PCI, EF 50% with inf HK. A Multilink  . COPD (chronic obstructive pulmonary disease) ([place])   . Depression   . Glucose intolerance (impaired glucose tolerance)    A1c 6.2 06/2010  . History of DVT (deep vein thrombosis)    traumatic, s/p coumadin tx.  Marland Kitchen HLD (hyperlipidemia)   . Hypertension    pt states is currently on no medications  . Myocardial infarction ([place])    around 2013  . Shortness of breath    walking distance or climbing stairs  . Tobacco abuse   . Urinary frequency     Past Surgical History:  Procedure Laterality Date  . CARDIAC CATHETERIZATION    . COLONOSCOPY N/A 12/29/2015   Procedure: COLONOSCOPY;  Surgeon: Rogene Houston, MD;  [facility];  Service: Endoscopy;  Laterality: N/A;  12:55  . ELECTROCARDIOGRAM     Showed inferolateral ST-elevation and a code STEMI was activated. In the ER, he was treated with morphine, herarin, and 600 mg of Plavix. He was transferred emergently to [facility].   . INGUINAL HERNIA REPAIR Left 07/26/2014   Procedure: OPEN REPAIR OF  RECURENT LEFT INGUINAL HERNIA REPAIR WITH MESH;  Surgeon: Greer Pickerel, MD;  [facility];  Service: General;  Laterality: Left;  . INSERTION OF MESH Bilateral 10/30/2013   Procedure: INSERTION OF MESH;  Surgeon: Gayland Curry, MD;  [facility];  Service: General;  Laterality: Bilateral;  . LAPAROSCOPIC INGUINAL HERNIA WITH UMBILICAL HERNIA Bilateral 10/30/2013   Procedure: laparoscopic repair left pantaloom hernia with mesh, laparoscopic right inguinal hernia with mesh, OPEN REPAIR OF UMBILICAL HERNIA ;  Surgeon: Gayland Curry, MD;  [facility];  Service: General;  Laterality: Bilateral;  . LAPAROSCOPY N/A 07/26/2014   Procedure: LAPAROSCOPY DIAGNOSTIC;  Surgeon: Greer Pickerel, MD;  [facility];  Service: General;  Laterality: N/A;  . POLYPECTOMY  12/29/2015   Procedure: POLYPECTOMY;  Surgeon: Rogene Houston, MD;  [facility];  Service: Endoscopy;;  ascending colon, descending colon  . stent placement    . TOTAL HIP ARTHROPLASTY Right 05/10/2015   Procedure: RIGHT TOTAL HIP ARTHROPLASTY ANTERIOR APPROACH;  Surgeon: Paralee Cancel, MD;  [facility];  Service: Orthopedics;  Laterality: Right;  Marland Kitchen VASECTOMY      There were no vitals filed for this visit.  Subjective Assessment - 08/19/17 1435    Subjective  Pt reports his  pain has been worse since last visit.  He has not been able to sleep at night.  He reports he continues to have a sore on outside of Lt anke.     Diagnostic tests  hip xray - WNL's; vascular study 03/28/17 (-)     Patient Stated Goals  get rid of the pain     Currently in Pain?  Yes    Pain Score  4     Pain Location  Leg    Pain Orientation  Left    Pain Radiating Towards  Lt ankle up to lateral thigh     Aggravating Factors   worse as day goes on; walking     Pain Relieving Factors  rest, ice,          [facility] PT Assessment - 08/19/17 0001      Assessment   Medical Diagnosis  Lt LE pain     Referring Provider  [name], FN-P     Onset Date/Surgical Date  02/08/17   increased symptoms in the past 2 months    Hand Dominance  Right    Next MD Visit  PRN     Prior Therapy  none      AROM   Left Ankle Dorsiflexion  -5        [facility] Adult PT Treatment/Exercise - 08/19/17 0001      Lumbar Exercises: Supine   Ab Set  5 seconds;5 reps;Limitations    AB Set Limitations  pt reported increased ankle pain upon completion - VC to breathe during exercise.     Bridge  10 reps;Limitations;5 seconds   pt reported increased pain in foot upon completion   Bridge Limitations  cues to breathe during exercise.       Knee/Hip Exercises: Stretches   Passive Hamstring Stretch  Left;2 reps;30 seconds   supine with strap    ITB Stretch  Left;2 reps;30 seconds    Piriformis Stretch  Left;3 reps;30 seconds   supine travell    Gastroc Stretch  Left;Right;2 reps;30 seconds    Soleus Stretch  --      Knee/Hip Exercises: Aerobic   Nustep  NuStep L3: 7 min    PTA present to discuss progress and monitor     Modalities   Modalities  Electrical Stimulation;Moist Heat;Vasopneumatic      Moist Heat Therapy   Number Minutes Moist Heat  15 Minutes    Moist Heat Location  Hip;Lumbar Spine   anterior lateral thigh     Electrical Stimulation   Electrical Stimulation Location  Lt lateral ankle; calf; thigh     Electrical Stimulation Action  IFC    Electrical Stimulation Parameters  to tolerance    Electrical Stimulation Goals  Pain;Tone      Manual Therapy   Manual therapy comments  patient supine     Soft tissue mobilization  light STM through the lateral Lt lower leg, thigh - pt very point tender in fibularis muscle group and lateral Achilles tendon.                   PT Long Term Goals - 08/19/17 1449      PT LONG TERM GOAL #1   Title  Improve LE moblity and ROM with patient to demonstrate increased Lt ankle DF to =/> 10 deg 09/23/17    Time  6    Period  Weeks    Status  On-going      PT LONG TERM  GOAL #2    Title  Decrease Lt LE pain by 50-75% allowing patient to perform normal functional activities with minimal pain or discomfort 09/23/17    Time  6    Period  Weeks    Status  On-going      PT LONG TERM GOAL #3   Title  Independent in HEP 09/23/17    Time  6    Period  Weeks    Status  On-going      PT LONG TERM GOAL #4   Title  Improve FOTO to </= 44% limitation 09/23/17    Time  6    Period  Weeks    Status  On-going            Plan - 08/19/17 1501    Clinical Impression Statement  Pt has had increase of symptoms in LE since last visit; DF less than last assessment. He required some cues to stay on task and for counting.  Pt reported increased pain in Lt ankle when performing bridges and TA engagement; reduced with rest.  He was point tender along lateral Lt lower leg with manual therapy.  No new goals met at this time.     Rehab Potential  Good    PT Frequency  2x / week    PT Duration  6 weeks    PT Treatment/Interventions  Patient/family education;ADLs/Self Care Home Management;Cryotherapy;Electrical Stimulation;Iontophoresis 44m/ml Dexamethasone;Moist Heat;Ultrasound;Dry needling;Manual techniques;Neuromuscular re-education;Therapeutic activities;Therapeutic exercise    PT Next Visit Plan  further assessment of Lt LE pain vs lumbar origin of pain; core stabilization; strengthening; manual work and modalities as indicated - consider manual work along lateral chain Lt LE     Consulted and Agree with Plan of Care  Patient       Patient will benefit from skilled therapeutic intervention in order to improve the following deficits and impairments:  Postural dysfunction, Improper body mechanics, Pain, Increased fascial restricitons, Increased muscle spasms, Hypomobility, Decreased mobility, Decreased range of motion, Decreased activity tolerance  Visit Diagnosis: Pain in left leg  Other symptoms and signs involving the musculoskeletal system  Muscle weakness  (generalized)     Problem List Patient Active Problem List   Diagnosis Date Noted  . Insomnia 03/26/2017  . Aortic atherosclerosis ([place] 10/19/2016  . Pain medication agreement signed 06/05/2016  . Opioid dependence ([place] 06/05/2016  . Diabetes mellitus without complication ([place] 036/14/4315 . Obesity (BMI 30-39.9) 04/13/2016  . Rectal bleeding 11/23/2015  . Moderate episode of recurrent major depressive disorder ([place] 10/18/2015  . S/P right THA, AA 05/10/2015  . Recurrent left inguinal hernia 07/26/2014  . Chronic right hip pain 06/23/2014  . S/P bilateral inguinal hernia repair 10/30/2013  . Nodule of left lung 05/29/2013  . Bilateral inguinal hernia 05/28/2013  . Umbilical hernia 040/08/6759 . Major psychotic depression, recurrent ([place] 12/16/2012  . Inadequate anticoagulation 04/29/2012  . Hypocalcemia 07/28/2010  . CAD (coronary artery disease)   . Hyperlipidemia associated with type 2 diabetes mellitus ([place]   . COPD (chronic obstructive pulmonary disease) ([place]   . Tobacco abuse    JKerin Perna PDelaware08/12/19 3:58 PM  [place] [address]Phone: [phone]  Fax:  [phone] Name: Anthony [name] MRN: [id number] of Birth: 101-02-1950

## 2017-08-22 ENCOUNTER — Telehealth: Payer: Self-pay | Admitting: Rehabilitative and Restorative Service Providers"

## 2017-08-22 ENCOUNTER — Encounter: Payer: Self-pay | Admitting: Rehabilitative and Restorative Service Providers"

## 2017-08-23 ENCOUNTER — Telehealth: Payer: Self-pay | Admitting: Family

## 2017-08-23 ENCOUNTER — Encounter: Payer: Self-pay | Admitting: Rehabilitative and Restorative Service Providers"

## 2017-08-23 ENCOUNTER — Ambulatory Visit (INDEPENDENT_AMBULATORY_CARE_PROVIDER_SITE_OTHER): Payer: Medicare HMO | Admitting: Rehabilitative and Restorative Service Providers"

## 2017-08-23 DIAGNOSIS — M6281 Muscle weakness (generalized): Secondary | ICD-10-CM | POA: Diagnosis not present

## 2017-08-23 DIAGNOSIS — R29898 Other symptoms and signs involving the musculoskeletal system: Secondary | ICD-10-CM | POA: Diagnosis not present

## 2017-08-23 DIAGNOSIS — M79605 Pain in left leg: Secondary | ICD-10-CM | POA: Diagnosis not present

## 2017-08-23 NOTE — Telephone Encounter (Signed)
Left message for Anthony [name] re- missed PT appointment. Ask that he contact office to schedule as needed.

## 2017-08-23 NOTE — Telephone Encounter (Signed)
Patient should be taking  Diclofenac BID with meals. We had stopped gabapentin as he states he could not tell a difference while taking it. He can take baclofen as needed.

## 2017-08-23 NOTE — Patient Instructions (Addendum)
Extremity Flexion (Hook-Lying)    Tighten core and slowly lower right arm over head until back begins to arch. Keep trunk rigid. Repeat __10__ times per set. Do _2-3__ sets per session. Do _1-2___ sessions per day.    Bent Leg Lift (Hook-Lying)    Tighten core and slowly raise right leg __10__ inches from floor. Keep trunk rigid. Hold __1-2__ seconds. Repeat __10__ times per set. Do __2-3__ sets per session. Do __1-2_ sessions per day.    Combination (Hook-Lying)    Tighten core and slowly raise left leg and lower opposite arm over head. Keep trunk rigid. Repeat __10__ times per set. Do __2-3__ sets per session. Do _1-2___ sessions per day.  Backward Bend (Standing)     Arch backward to make hollow of back deeper. Hold _1-2___ seconds. Repeat __2-3__ times per set.  Do __3-4__ sessions per day.

## 2017-08-23 NOTE — Telephone Encounter (Signed)
Patient aware and verbalizes understanding. 

## 2017-08-23 NOTE — Therapy (Signed)
[place], [place], [address] Phone: [phone]   Fax:  [phone]  Physical Therapy Treatment  Patient Details  Name: Anthony [name]. MRN: [id number] Date of Birth: 05-27-1950 Referring Provider: [name], FN-P   Encounter Date: 08/23/2017  PT End of Session - 08/23/17 1102    Visit Number  4    Number of Visits  12    Date for PT Re-Evaluation  09/23/17    PT Start Time  1102    PT Stop Time  1200    PT Time Calculation (min)  58 min    Activity Tolerance  Patient tolerated treatment well       Past Medical History:  Diagnosis Date  . Anxiety   . Arthritis   . Bipolar disorder ([place])   . CAD (coronary artery disease)    a. INF STEMI 07/04/10:  tx with thrombectomy + [facility] BMS to [facility];  cath 07/04/10: dLM 10-20%, pLAD 40-50%, mLAD 20-30%, pRCA 30%, mRCA occluded and tx with PCI, EF 50% with inf HK. A Multilink  . COPD (chronic obstructive pulmonary disease) ([place])   . Depression   . Glucose intolerance (impaired glucose tolerance)    A1c 6.2 06/2010  . History of DVT (deep vein thrombosis)    traumatic, s/p coumadin tx.  Marland Kitchen HLD (hyperlipidemia)   . Hypertension    pt states is currently on no medications  . Myocardial infarction ([place])    around 2013  . Shortness of breath    walking distance or climbing stairs  . Tobacco abuse   . Urinary frequency     Past Surgical History:  Procedure Laterality Date  . CARDIAC CATHETERIZATION    . COLONOSCOPY N/A 12/29/2015   Procedure: COLONOSCOPY;  Surgeon: Rogene Houston, MD;  [facility];  Service: Endoscopy;  Laterality: N/A;  12:55  . ELECTROCARDIOGRAM     Showed inferolateral ST-elevation and a code STEMI was activated. In the ER, he was treated with morphine, herarin, and 600 mg of Plavix. He was transferred emergently to [facility].   . INGUINAL HERNIA REPAIR Left 07/26/2014   Procedure: OPEN REPAIR OF  RECURENT LEFT INGUINAL HERNIA REPAIR WITH MESH;  Surgeon: Greer Pickerel, MD;  [facility];  Service: General;  Laterality: Left;  . INSERTION OF MESH Bilateral 10/30/2013   Procedure: INSERTION OF MESH;  Surgeon: Gayland Curry, MD;  [facility];  Service: General;  Laterality: Bilateral;  . LAPAROSCOPIC INGUINAL HERNIA WITH UMBILICAL HERNIA Bilateral 10/30/2013   Procedure: laparoscopic repair left pantaloom hernia with mesh, laparoscopic right inguinal hernia with mesh, OPEN REPAIR OF UMBILICAL HERNIA ;  Surgeon: Gayland Curry, MD;  [facility];  Service: General;  Laterality: Bilateral;  . LAPAROSCOPY N/A 07/26/2014   Procedure: LAPAROSCOPY DIAGNOSTIC;  Surgeon: Greer Pickerel, MD;  [facility];  Service: General;  Laterality: N/A;  . POLYPECTOMY  12/29/2015   Procedure: POLYPECTOMY;  Surgeon: Rogene Houston, MD;  [facility];  Service: Endoscopy;;  ascending colon, descending colon  . stent placement    . TOTAL HIP ARTHROPLASTY Right 05/10/2015   Procedure: RIGHT TOTAL HIP ARTHROPLASTY ANTERIOR APPROACH;  Surgeon: Paralee Cancel, MD;  [facility];  Service: Orthopedics;  Laterality: Right;  Marland Kitchen VASECTOMY      There were no vitals filed for this visit.  Subjective Assessment - 08/23/17 1102    Subjective  [name] reports that he  continues to have Lt LE pain. He still has trouble sleeping at night and then his leg feels better the following day. He is taking the max of his medications and continues to have pain.  He has some relief with aspirin based topical analgesic.     Currently in Pain?  Yes    Pain Score  2     Pain Location  Leg    Pain Orientation  Left    Pain Descriptors / Indicators  Sharp    Pain Type  Chronic pain    Pain Radiating Towards  Lt ankle to knee     Pain Onset  More than a month ago    Pain Frequency  Intermittent                       [facility] Adult PT Treatment/Exercise - 08/23/17 0001      Lumbar Exercises: Supine    Ab Set  10 reps   10 sec verbal instruction for 3 part core(difficult for pt)    Bent Knee Raise  10 reps;2 seconds   core engaged    Dead Bug  10 reps;2 seconds   VC for core engaged    Bridge  10 reps;3 seconds    Bridge Limitations  VC for breathing     Other Supine Lumbar Exercises  shoulder flexion 90 deg to 160 deg core engaged       Knee/Hip Exercises: Aerobic   Nustep  NuStep L5 x 7 min    PTA present to discuss progress and monitor     Moist Heat Therapy   Number Minutes Moist Heat  20 Minutes    Moist Heat Location  Hip;Lumbar Spine   lateral Lt leg      Electrical Stimulation   Electrical Stimulation Location  bilat lumbar; Lt QL/lumbar musculature     Electrical Stimulation Action  IFC    Electrical Stimulation Parameters  to tolerance    Electrical Stimulation Goals  Pain;Tone      Manual Therapy   Manual therapy comments  patient prone     Joint Mobilization  CPA mobs Grade II/III through lower lumbar spine     Soft tissue mobilization  soft tissue work through the Lt lumbar paraspinals; QL; lats into the Lt posterior hip musculature     Myofascial Release  lt lumbar to posterior hip              PT Education - 08/23/17 1133    Education Details  HEP     Person(s) Educated  Patient    Methods  Explanation;Demonstration;Tactile cues;Verbal cues;Handout    Comprehension  Verbalized understanding;Returned demonstration;Verbal cues required;Tactile cues required          PT Long Term Goals - 08/19/17 1449      PT LONG TERM GOAL #1   Title  Improve LE moblity and ROM with patient to demonstrate increased Lt ankle DF to =/> 10 deg 09/23/17    Time  6    Period  Weeks    Status  On-going      PT LONG TERM GOAL #2   Title  Decrease Lt LE pain by 50-75% allowing patient to perform normal functional activities with minimal pain or discomfort 09/23/17    Time  6    Period  Weeks    Status  On-going      PT LONG TERM GOAL #3   Title  Independent in HEP  09/23/17    Time  6    Period  Weeks    Status  On-going      PT LONG TERM GOAL #4   Title  Improve FOTO to </= 44% limitation 09/23/17    Time  6    Period  Weeks    Status  On-going            Plan - 08/23/17 1148    Clinical Impression Statement  Cirilo has continued Lt LE pain with symptoms variable in nature. He has pain in the anterior thigh and laterl leg to ankle which is increased with trunk extension and lumbar testing. Patient has temporary relief of symptoms with therapy interventions. He may benefit from further work up for lumbar radiculopathy.     Rehab Potential  Good    PT Frequency  2x / week    PT Duration  6 weeks    PT Treatment/Interventions  Patient/family education;ADLs/Self Care Home Management;Cryotherapy;Electrical Stimulation;Iontophoresis 4mg /ml Dexamethasone;Moist Heat;Ultrasound;Dry needling;Manual techniques;Neuromuscular re-education;Therapeutic activities;Therapeutic exercise    PT Next Visit Plan  core stabilization; strengthening; manual work and modalities as indicated -  manual work through lumbar spine as well as along lateral chain Lt LE as indicated      Consulted and Agree with Plan of Care  Patient       Patient will benefit from skilled therapeutic intervention in order to improve the following deficits and impairments:  Postural dysfunction, Improper body mechanics, Pain, Increased fascial restricitons, Increased muscle spasms, Hypomobility, Decreased mobility, Decreased range of motion, Decreased activity tolerance  Visit Diagnosis: Pain in left leg  Other symptoms and signs involving the musculoskeletal system  Muscle weakness (generalized)     Problem List Patient Active Problem List   Diagnosis Date Noted  . Insomnia 03/26/2017  . Aortic atherosclerosis ([place]) 10/19/2016  . Pain medication agreement signed 06/05/2016  . Opioid dependence ([place]) 06/05/2016  . Diabetes mellitus without complication ([place]) 93/81/8299  . Obesity  (BMI 30-39.9) 04/13/2016  . Rectal bleeding 11/23/2015  . Moderate episode of recurrent major depressive disorder ([place]) 10/18/2015  . S/P right THA, AA 05/10/2015  . Recurrent left inguinal hernia 07/26/2014  . Chronic right hip pain 06/23/2014  . S/P bilateral inguinal hernia repair 10/30/2013  . Nodule of left lung 05/29/2013  . Bilateral inguinal hernia 05/28/2013  . Umbilical hernia 37/16/9678  . Major psychotic depression, recurrent ([place]) 12/16/2012  . Inadequate anticoagulation 04/29/2012  . Hypocalcemia 07/28/2010  . CAD (coronary artery disease)   . Hyperlipidemia associated with type 2 diabetes mellitus ([place])   . COPD (chronic obstructive pulmonary disease) ([place])   . Tobacco abuse     Nikai Quest Nilda Simmer [facility]  08/23/2017, 11:52 AM  [facility] [place], [place], [address] Phone: [phone]   Fax:  [phone]  Name: Juda Toepfer. MRN: [id number] Date of Birth: [birth year]

## 2017-08-23 NOTE — Telephone Encounter (Signed)
Patient moved to [facility] and he has been to outpatient rehab and they wanted him to ask if he should continue baclofen, gabapentin, and then there is one more that he can not remember the name of and is supposed to call us back.

## 2017-08-28 ENCOUNTER — Encounter: Payer: Self-pay | Admitting: Physical Therapy

## 2017-08-28 ENCOUNTER — Ambulatory Visit (INDEPENDENT_AMBULATORY_CARE_PROVIDER_SITE_OTHER): Payer: Medicare HMO | Admitting: Physical Therapy

## 2017-08-28 DIAGNOSIS — M79605 Pain in left leg: Secondary | ICD-10-CM

## 2017-08-28 DIAGNOSIS — M6281 Muscle weakness (generalized): Secondary | ICD-10-CM | POA: Diagnosis not present

## 2017-08-28 DIAGNOSIS — R29898 Other symptoms and signs involving the musculoskeletal system: Secondary | ICD-10-CM | POA: Diagnosis not present

## 2017-08-28 NOTE — Therapy (Signed)
[place], [place], [address] Phone: [phone]   Fax:  [phone]  Physical Therapy Treatment  Patient Details  Name: Anthony [name]. MRN: [id number] Date of Birth: [birth year] Referring Provider: [name], FN-P   Encounter Date: 08/28/2017  PT End of Session - 08/28/17 1503    Visit Number  5    Number of Visits  12    Date for PT Re-Evaluation  09/23/17    PT Start Time  1438   pt arrived late   PT Stop Time  1533    PT Time Calculation (min)  55 min    Behavior During Therapy  [facility] for tasks assessed/performed       Past Medical History:  Diagnosis Date  . Anxiety   . Arthritis   . Bipolar disorder ([place])   . CAD (coronary artery disease)    a. INF STEMI 07/04/10:  tx with thrombectomy + [facility] BMS to [facility];  cath 07/04/10: dLM 10-20%, pLAD 40-50%, mLAD 20-30%, pRCA 30%, mRCA occluded and tx with PCI, EF 50% with inf HK. A Multilink  . COPD (chronic obstructive pulmonary disease) ([place])   . Depression   . Glucose intolerance (impaired glucose tolerance)    A1c 6.2 06/2010  . History of DVT (deep vein thrombosis)    traumatic, s/p coumadin tx.  Marland Kitchen HLD (hyperlipidemia)   . Hypertension    pt states is currently on no medications  . Myocardial infarction ([place])    around 2013  . Shortness of breath    walking distance or climbing stairs  . Tobacco abuse   . Urinary frequency     Past Surgical History:  Procedure Laterality Date  . CARDIAC CATHETERIZATION    . COLONOSCOPY N/A 12/29/2015   Procedure: COLONOSCOPY;  Surgeon: Rogene Houston, MD;  [facility];  Service: Endoscopy;  Laterality: N/A;  12:55  . ELECTROCARDIOGRAM     Showed inferolateral ST-elevation and a code STEMI was activated. In the ER, he was treated with morphine, herarin, and 600 mg of Plavix. He was transferred emergently to [facility].   . INGUINAL HERNIA REPAIR Left 07/26/2014   Procedure:  OPEN REPAIR OF RECURENT LEFT INGUINAL HERNIA REPAIR WITH MESH;  Surgeon: Greer Pickerel, MD;  [facility]: [facility];  Service: General;  Laterality: Left;  . INSERTION OF MESH Bilateral 10/30/2013   Procedure: INSERTION OF MESH;  Surgeon: Gayland Curry, MD;  [facility];  Service: General;  Laterality: Bilateral;  . LAPAROSCOPIC INGUINAL HERNIA WITH UMBILICAL HERNIA Bilateral 10/30/2013   Procedure: laparoscopic repair left pantaloom hernia with mesh, laparoscopic right inguinal hernia with mesh, OPEN REPAIR OF UMBILICAL HERNIA ;  Surgeon: Gayland Curry, MD;  [facility];  Service: General;  Laterality: Bilateral;  . LAPAROSCOPY N/A 07/26/2014   Procedure: LAPAROSCOPY DIAGNOSTIC;  Surgeon: Greer Pickerel, MD;  [facility];  Service: General;  Laterality: N/A;  . POLYPECTOMY  12/29/2015   Procedure: POLYPECTOMY;  Surgeon: Rogene Houston, MD;  [facility];  Service: Endoscopy;;  ascending colon, descending colon  . stent placement    . TOTAL HIP ARTHROPLASTY Right 05/10/2015   Procedure: RIGHT TOTAL HIP ARTHROPLASTY ANTERIOR APPROACH;  Surgeon: Paralee Cancel, MD;  [facility];  Service: Orthopedics;  Laterality: Right;  Marland Kitchen VASECTOMY      There were no vitals filed for this visit.  Subjective Assessment - 08/28/17 1451    Subjective  Pt reports his pain in his LLE has improved by 20% since initiating therapy.  He has been using his TENS unit a few times per day.  He has been taking pain meds later in the day; thinks this may be helping him sleep.      Currently in Pain?  Yes    Pain Score  3     Pain Location  Leg    Pain Orientation  Left    Pain Descriptors / Indicators  Tightness    Aggravating Factors   worse as day goes on    Pain Relieving Factors  ice, rest, TENS         [facility] PT Assessment - 08/28/17 0001      Assessment   Medical Diagnosis  Lt LE pain     Referring Provider  [name], FN-P    Onset Date/Surgical Date  02/08/17   increased symptoms in  the past 2 months    Hand Dominance  Right    Next MD Visit  PRN         [facility] Adult PT Treatment/Exercise - 08/28/17 0001      Lumbar Exercises: Supine   Bent Knee Raise  10 reps;2 seconds;Limitations   core engaged    Bent Knee Raise Limitations  tactile cues for coordination of arm/leg    Bridge  10 reps;3 seconds   with ab set     Knee/Hip Exercises: Stretches   Passive Hamstring Stretch  Left;Right;30 seconds;3 reps    Piriformis Stretch  Left;3 reps;60 seconds   supine travell      Knee/Hip Exercises: Aerobic   Nustep  NuStep L5 x  6 min    PTA present to discuss progress and monitor     Moist Heat Therapy   Number Minutes Moist Heat  15 Minutes    Moist Heat Location  Hip;Lumbar Spine   lateral Lt leg      Electrical Stimulation   Electrical Stimulation Location  Lt glute; Lt distal HS, Lt fibularis     Electrical Stimulation Action  premod to each area    Electrical Stimulation Parameters  to tolerance    Electrical Stimulation Goals  Pain;Tone      Manual Therapy   Manual therapy comments  patient prone     Soft tissue mobilization  STM through Lt QL, glute, hamstring, piriformis                   PT Long Term Goals - 08/28/17 1527      PT LONG TERM GOAL #1   Title  Improve LE moblity and ROM with patient to demonstrate increased Lt ankle DF to =/> 10 deg 09/23/17    Time  6    Period  Weeks    Status  On-going      PT LONG TERM GOAL #2   Title  Decrease Lt LE pain by 50-75% allowing patient to perform normal functional activities with minimal pain or discomfort 09/23/17    Time  6    Period  Weeks    Status  On-going      PT LONG TERM GOAL #3   Title  Independent in HEP 09/23/17    Time  6    Period  Weeks    Status  On-going      PT LONG TERM GOAL #4   Title  Improve FOTO to </= 44% limitation 09/23/17    Time  6  Period  Weeks    Status  On-going            Plan - 08/28/17 1524    Clinical Impression Statement  Pt  tolerated exercises without increase in symptoms.  Pt reported increased pain up to 6-7/10 in calf with STM to piriformis/ distal hamstring; reduced with rest and position change.  Encouraged pt to comply with HEP to assist in meeting goals. Pt making gradual progress towards goals.     Rehab Potential  Good    PT Frequency  2x / week    PT Duration  6 weeks    PT Treatment/Interventions  Patient/family education;ADLs/Self Care Home Management;Cryotherapy;Electrical Stimulation;Iontophoresis 4mg /ml Dexamethasone;Moist Heat;Ultrasound;Dry needling;Manual techniques;Neuromuscular re-education;Therapeutic activities;Therapeutic exercise    PT Next Visit Plan  core stabilization; strengthening; manual work and modalities as indicated -  manual work through lumbar spine as well as along lateral chain Lt LE as indicated      Consulted and Agree with Plan of Care  Patient       Patient will benefit from skilled therapeutic intervention in order to improve the following deficits and impairments:  Postural dysfunction, Improper body mechanics, Pain, Increased fascial restricitons, Increased muscle spasms, Hypomobility, Decreased mobility, Decreased range of motion, Decreased activity tolerance  Visit Diagnosis: Pain in left leg  Other symptoms and signs involving the musculoskeletal system  Muscle weakness (generalized)     Problem List Patient Active Problem List   Diagnosis Date Noted  . Insomnia 03/26/2017  . Aortic atherosclerosis ([place]) 10/19/2016  . Pain medication agreement signed 06/05/2016  . Opioid dependence ([place]) 06/05/2016  . Diabetes mellitus without complication ([place]) 36/62/9476  . Obesity (BMI 30-39.9) 04/13/2016  . Rectal bleeding 11/23/2015  . Moderate episode of recurrent major depressive disorder ([place]) 10/18/2015  . S/P right THA, AA 05/10/2015  . Recurrent left inguinal hernia 07/26/2014  . Chronic right hip pain 06/23/2014  . S/P bilateral inguinal hernia repair  10/30/2013  . Nodule of left lung 05/29/2013  . Bilateral inguinal hernia 05/28/2013  . Umbilical hernia 54/65/0354  . Major psychotic depression, recurrent ([place]) 12/16/2012  . Inadequate anticoagulation 04/29/2012  . Hypocalcemia 07/28/2010  . CAD (coronary artery disease)   . Hyperlipidemia associated with type 2 diabetes mellitus ([place])   . COPD (chronic obstructive pulmonary disease) ([place])   . Tobacco abuse    Kerin Perna, [place] 08/28/17 3:31 PM  [place], [place], [address] Phone: [phone]   Fax:  [phone]  Name: Marquan Vokes. MRN: [id number] Date of Birth: 1950-11-12

## 2017-08-30 ENCOUNTER — Ambulatory Visit (INDEPENDENT_AMBULATORY_CARE_PROVIDER_SITE_OTHER): Payer: Medicare HMO | Admitting: Physical Therapy

## 2017-08-30 DIAGNOSIS — M79605 Pain in left leg: Secondary | ICD-10-CM

## 2017-08-30 DIAGNOSIS — R29898 Other symptoms and signs involving the musculoskeletal system: Secondary | ICD-10-CM

## 2017-08-30 DIAGNOSIS — M6281 Muscle weakness (generalized): Secondary | ICD-10-CM | POA: Diagnosis not present

## 2017-08-30 NOTE — Therapy (Signed)
[place], [place], [address] Phone: [phone]   Fax:  [phone]  Physical Therapy Treatment  Patient Details  Name: Anthony [name]. MRN: [id number] Date of Birth: 05-Apr-1950 Referring Provider: [name], FN-P   Encounter Date: 08/30/2017  PT End of Session - 08/30/17 1113    Visit Number  6    Number of Visits  12    Date for PT Re-Evaluation  09/23/17    PT Start Time  1108   pt arrived late   PT Stop Time  1205    PT Time Calculation (min)  57 min    Behavior During Therapy  [facility] for tasks assessed/performed       Past Medical History:  Diagnosis Date  . Anxiety   . Arthritis   . Bipolar disorder ([place])   . CAD (coronary artery disease)    a. INF STEMI 07/04/10:  tx with thrombectomy + [facility] BMS to [facility];  cath 07/04/10: dLM 10-20%, pLAD 40-50%, mLAD 20-30%, pRCA 30%, mRCA occluded and tx with PCI, EF 50% with inf HK. A Multilink  . COPD (chronic obstructive pulmonary disease) ([place])   . Depression   . Glucose intolerance (impaired glucose tolerance)    A1c 6.2 06/2010  . History of DVT (deep vein thrombosis)    traumatic, s/p coumadin tx.  Marland Kitchen HLD (hyperlipidemia)   . Hypertension    pt states is currently on no medications  . Myocardial infarction ([place])    around 2013  . Shortness of breath    walking distance or climbing stairs  . Tobacco abuse   . Urinary frequency     Past Surgical History:  Procedure Laterality Date  . CARDIAC CATHETERIZATION    . COLONOSCOPY N/A 12/29/2015   Procedure: COLONOSCOPY;  Surgeon: Rogene Houston, MD;  [facility];  Service: Endoscopy;  Laterality: N/A;  12:55  . ELECTROCARDIOGRAM     Showed inferolateral ST-elevation and a code STEMI was activated. In the ER, he was treated with morphine, herarin, and 600 mg of Plavix. He was transferred emergently to [facility].   . INGUINAL HERNIA REPAIR Left 07/26/2014   Procedure:  OPEN REPAIR OF RECURENT LEFT INGUINAL HERNIA REPAIR WITH MESH;  Surgeon: Greer Pickerel, MD;  [facility];  Service: General;  Laterality: Left;  . INSERTION OF MESH Bilateral 10/30/2013   Procedure: INSERTION OF MESH;  Surgeon: Gayland Curry, MD;  [facility];  Service: General;  Laterality: Bilateral;  . LAPAROSCOPIC INGUINAL HERNIA WITH UMBILICAL HERNIA Bilateral 10/30/2013   Procedure: laparoscopic repair left pantaloom hernia with mesh, laparoscopic right inguinal hernia with mesh, OPEN REPAIR OF UMBILICAL HERNIA ;  Surgeon: Gayland Curry, MD;  [facility];  Service: General;  Laterality: Bilateral;  . LAPAROSCOPY N/A 07/26/2014   Procedure: LAPAROSCOPY DIAGNOSTIC;  Surgeon: Greer Pickerel, MD;  [facility];  Service: General;  Laterality: N/A;  . POLYPECTOMY  12/29/2015   Procedure: POLYPECTOMY;  Surgeon: Rogene Houston, MD;  [facility];  Service: Endoscopy;;  ascending colon, descending colon  . stent placement    . TOTAL HIP ARTHROPLASTY Right 05/10/2015   Procedure: RIGHT TOTAL HIP ARTHROPLASTY ANTERIOR APPROACH;  Surgeon: Paralee Cancel, MD;  [facility];  Service: Orthopedics;  Laterality: Right;  Marland Kitchen VASECTOMY      There were no vitals filed for this visit.  Subjective Assessment - 08/30/17 1113    Subjective  Pt reports his LLE has been painful again. Had trouble sleeping because he can't get comfortable.  He has not done any of his exercises at  home.      Patient Stated Goals  get rid of the pain     Currently in Pain?  Yes    Pain Score  1     Pain Location  Leg    Pain Orientation  Left    Pain Descriptors / Indicators  Tightness;Sharp    Aggravating Factors   worse as day goes on.     Pain Relieving Factors  ice/heat, rest, TENS         [facility] PT Assessment - 08/30/17 0001      Assessment   Medical Diagnosis  Lt LE pain     Referring Provider  [name], FN-P    Onset Date/Surgical Date  02/08/17   increased symptoms in the past 2  months    Hand Dominance  Right    Next MD Visit  PRN       AROM   Right Ankle Dorsiflexion  6    Left Ankle Dorsiflexion  -4    Lumbar Flexion  75%   fingertips to ant shin   Lumbar Extension  WNL     Lumbar - Right Side Bend  WNL    Lumbar - Left Side Bend  WNL   with increased pain in LLE   Lumbar - Right Rotation  50%    Lumbar - Left Rotation  90%          [facility] Adult PT Treatment/Exercise - 08/30/17 0001      Lumbar Exercises: Stretches   Prone on Elbows Stretch  2 reps    Passive Hamstring Stretch  Left;Right;30 seconds;2 reps   seated   Piriformis Stretch  Right;Left;2 reps;30 seconds   seated     Lumbar Exercises: Seated   Other Seated Lumbar Exercises  posterior lean with core engaged, alternating shoulder flex to 90 deg x 10 reps      Lumbar Exercises: Prone   Opposite Arm/Leg Raise  Left arm/Right leg;Right arm/Left leg;10 reps;2 seconds      Knee/Hip Exercises: Stretches   Press photographer  Left;Right;2 reps;30 seconds      Knee/Hip Exercises: Aerobic   Nustep  NuStep L5 x  6 min    PTA present to discuss progress and monitor     Moist Heat Therapy   Number Minutes Moist Heat  15 Minutes    Moist Heat Location  Hip;Lumbar Spine   lateral Lt leg      Electrical Stimulation   Electrical Stimulation Location  bilat lumbar; Lt QL/lumbar musculature     Electrical Stimulation Action  IFC    Electrical Stimulation Parameters  to tolerance    Electrical Stimulation Goals  Pain;Tone      Manual Therapy   Soft tissue mobilization  --    Myofascial Release  --                  PT Long Term Goals - 08/28/17 1527      PT LONG TERM GOAL #1   Title  Improve LE moblity and ROM with patient to demonstrate increased Lt ankle DF to =/> 10 deg 09/23/17    Time  6    Period  Weeks    Status  On-going      PT LONG TERM GOAL #2   Title  Decrease Lt LE pain by  50-75% allowing patient to perform normal functional activities with minimal pain or  discomfort 09/23/17    Time  6    Period  Weeks    Status  On-going      PT LONG TERM GOAL #3   Title  Independent in HEP 09/23/17    Time  6    Period  Weeks    Status  On-going      PT LONG TERM GOAL #4   Title  Improve FOTO to </= 44% limitation 09/23/17    Time  6    Period  Weeks    Status  On-going            Plan - 08/30/17 1155    Clinical Impression Statement  Pt reported some increased Lt calf pain with prone position, and increased Lt thigh discomfort with opp arm/leg; reduced with position change. Lumbar ROM has improved. Active dorsiflexion remains limited, passive WNL (observed during stretches).  Encouraged pt once again to comply with HEP to assist in pain reduction and meeting goals. No new goals met this date.     Rehab Potential  Good    PT Frequency  2x / week    PT Duration  6 weeks    PT Treatment/Interventions  Patient/family education;ADLs/Self Care Home Management;Cryotherapy;Electrical Stimulation;Iontophoresis 1m/ml Dexamethasone;Moist Heat;Ultrasound;Dry needling;Manual techniques;Neuromuscular re-education;Therapeutic activities;Therapeutic exercise    PT Next Visit Plan  core stabilization; strengthening; manual work and modalities as indicated -  manual work as indicated      COncologistwith Plan of Care  Patient       Patient will benefit from skilled therapeutic intervention in order to improve the following deficits and impairments:  Postural dysfunction, Improper body mechanics, Pain, Increased fascial restricitons, Increased muscle spasms, Hypomobility, Decreased mobility, Decreased range of motion, Decreased activity tolerance  Visit Diagnosis: Pain in left leg  Other symptoms and signs involving the musculoskeletal system  Muscle weakness (generalized)     Problem List Patient Active Problem List   Diagnosis Date Noted  . Insomnia 03/26/2017  . Aortic atherosclerosis ([place] 10/19/2016  . Pain medication agreement signed  06/05/2016  . Opioid dependence ([place] 06/05/2016  . Diabetes mellitus without complication ([place] 060/60/0459 . Obesity (BMI 30-39.9) 04/13/2016  . Rectal bleeding 11/23/2015  . Moderate episode of recurrent major depressive disorder ([place] 10/18/2015  . S/P right THA, AA 05/10/2015  . Recurrent left inguinal hernia 07/26/2014  . Chronic right hip pain 06/23/2014  . S/P bilateral inguinal hernia repair 10/30/2013  . Nodule of left lung 05/29/2013  . Bilateral inguinal hernia 05/28/2013  . Umbilical hernia 097/74/1423 . Major psychotic depression, recurrent ([place] 12/16/2012  . Inadequate anticoagulation 04/29/2012  . Hypocalcemia 07/28/2010  . CAD (coronary artery disease)   . Hyperlipidemia associated with type 2 diabetes mellitus ([place]   . COPD (chronic obstructive pulmonary disease) ([place]   . Tobacco abuse    JKerin Perna PDelaware08/23/19 12:18 PM  [place] [address]Phone: [phone]  Fax:  [phone] Name: DSebastiano [name] MRN: [id number] of Birth: [birth year]

## 2017-09-02 ENCOUNTER — Encounter: Payer: Self-pay | Admitting: Physical Therapy

## 2017-09-04 ENCOUNTER — Telehealth: Payer: Self-pay | Admitting: Family

## 2017-09-04 ENCOUNTER — Ambulatory Visit: Payer: Medicare HMO | Admitting: Physical Therapy

## 2017-09-04 ENCOUNTER — Encounter: Payer: Self-pay | Admitting: Physical Therapy

## 2017-09-04 DIAGNOSIS — M6281 Muscle weakness (generalized): Secondary | ICD-10-CM | POA: Diagnosis not present

## 2017-09-04 DIAGNOSIS — M159 Polyosteoarthritis, unspecified: Secondary | ICD-10-CM

## 2017-09-04 DIAGNOSIS — R29898 Other symptoms and signs involving the musculoskeletal system: Secondary | ICD-10-CM

## 2017-09-04 DIAGNOSIS — M79605 Pain in left leg: Secondary | ICD-10-CM | POA: Diagnosis not present

## 2017-09-04 NOTE — Therapy (Signed)
[place], [place], [address] Phone: [phone]   Fax:  [phone]  Physical Therapy Treatment  Patient Details  Name: Anthony [name]. MRN: [id number] Date of Birth: [birth year] Referring Provider: [name], FN-P   Encounter Date: 09/04/2017  PT End of Session - 09/04/17 1509    Visit Number  7    Number of Visits  12    Date for PT Re-Evaluation  09/23/17    PT Start Time  4854    PT Stop Time  1524    PT Time Calculation (min)  53 min    Activity Tolerance  Patient tolerated treatment well    Behavior During Therapy  [facility] for tasks assessed/performed       Past Medical History:  Diagnosis Date  . Anxiety   . Arthritis   . Bipolar disorder ([place])   . CAD (coronary artery disease)    a. INF STEMI 07/04/10:  tx with thrombectomy + [facility] to [facility];  cath 07/04/10: dLM 10-20%, pLAD 40-50%, mLAD 20-30%, pRCA 30%, mRCA occluded and tx with PCI, EF 50% with inf HK. A Multilink  . COPD (chronic obstructive pulmonary disease) ([place])   . Depression   . Glucose intolerance (impaired glucose tolerance)    A1c 6.2 06/2010  . History of DVT (deep vein thrombosis)    traumatic, s/p coumadin tx.  Marland Kitchen HLD (hyperlipidemia)   . Hypertension    pt states is currently on no medications  . Myocardial infarction ([place])    around 2013  . Shortness of breath    walking distance or climbing stairs  . Tobacco abuse   . Urinary frequency     Past Surgical History:  Procedure Laterality Date  . CARDIAC CATHETERIZATION    . COLONOSCOPY N/A 12/29/2015   Procedure: COLONOSCOPY;  Surgeon: Rogene Houston, MD;  [facility];  Service: Endoscopy;  Laterality: N/A;  12:55  . ELECTROCARDIOGRAM     Showed inferolateral ST-elevation and a code STEMI was activated. In the ER, he was treated with morphine, herarin, and 600 mg of Plavix. He was transferred emergently to [facility].   . INGUINAL  HERNIA REPAIR Left 07/26/2014   Procedure: OPEN REPAIR OF RECURENT LEFT INGUINAL HERNIA REPAIR WITH MESH;  Surgeon: Greer Pickerel, MD;  [facility]: [facility];  Service: General;  Laterality: Left;  . INSERTION OF MESH Bilateral 10/30/2013   Procedure: INSERTION OF MESH;  Surgeon: Gayland Curry, MD;  [facility];  Service: General;  Laterality: Bilateral;  . LAPAROSCOPIC INGUINAL HERNIA WITH UMBILICAL HERNIA Bilateral 10/30/2013   Procedure: laparoscopic repair left pantaloom hernia with mesh, laparoscopic right inguinal hernia with mesh, OPEN REPAIR OF UMBILICAL HERNIA ;  Surgeon: Gayland Curry, MD;  [facility];  Service: General;  Laterality: Bilateral;  . LAPAROSCOPY N/A 07/26/2014   Procedure: LAPAROSCOPY DIAGNOSTIC;  Surgeon: Greer Pickerel, MD;  [facility];  Service: General;  Laterality: N/A;  . POLYPECTOMY  12/29/2015   Procedure: POLYPECTOMY;  Surgeon: Rogene Houston, MD;  [facility];  Service: Endoscopy;;  ascending colon, descending colon  . stent placement    . TOTAL HIP ARTHROPLASTY Right 05/10/2015   Procedure: RIGHT TOTAL HIP ARTHROPLASTY ANTERIOR APPROACH;  Surgeon: Paralee Cancel, MD;  [facility];  Service: Orthopedics;  Laterality: Right;  Marland Kitchen VASECTOMY      There were no vitals filed for this visit.  Subjective Assessment -  09/04/17 1433    Subjective  reports he's "so-so."  "a little worse than usual."  having more trouble sleeping due to the pain    Pertinent History  AODM; MI; hernia repair x 2 most recent ~ 1 1/2 yr ago; arthritis; Rt THA 1-2 yrs ago(unsure of date); DVT 2002      Diagnostic tests  hip xray - WNL's; vascular study 03/28/17 (-)     Patient Stated Goals  get rid of the pain     Currently in Pain?  Yes    Pain Score  3     Pain Location  Leg    Pain Orientation  Left    Pain Descriptors / Indicators  Tightness;Sharp    Pain Type  Chronic pain    Pain Onset  More than a month ago    Pain Frequency  Intermittent    Aggravating Factors    worse as the day goes on    Pain Relieving Factors  ice/heat, TENS, rest         [facility] PT Assessment - 09/04/17 1502      Observation/Other Assessments   Focus on Therapeutic Outcomes (FOTO)   33 (67% limitation)                   [facility] Adult PT Treatment/Exercise - 09/04/17 1435      Exercises   Exercises  Lumbar      Lumbar Exercises: Stretches   Single Knee to Chest Stretch  Right;Left;2 reps;20 seconds    Piriformis Stretch  Right;Left;2 reps;30 seconds    Piriformis Stretch Limitations  knee to opposite shoulder    Passive Hamstring Stretch  Left;Right;30 seconds;2 reps   seated     Lumbar Exercises: Supine   Ab Set  20 reps;5 seconds    Bent Knee Raise  10 reps;2 seconds;Limitations    Bent Knee Raise Limitations  cues for core activation    Bridge  20 reps;5 seconds      Knee/Hip Exercises: Stretches   Press photographer  Left;Right;2 reps;30 seconds      Knee/Hip Exercises: Aerobic   Nustep  NuStep L5 x  6 min       Moist Heat Therapy   Number Minutes Moist Heat  15 Minutes    Moist Heat Location  Hip;Lumbar Spine      Electrical Stimulation   Electrical Stimulation Location  bilat lumbar; Lt QL/lumbar musculature     Electrical Stimulation Action  IFC    Electrical Stimulation Parameters  to tolerance x 15 min    Electrical Stimulation Goals  Pain;Tone                  PT Long Term Goals - 09/04/17 1510      PT LONG TERM GOAL #1   Title  Improve LE moblity and ROM with patient to demonstrate increased Lt ankle DF to =/> 10 deg 09/23/17    Time  6    Period  Weeks    Status  On-going      PT LONG TERM GOAL #2   Title  Decrease Lt LE pain by 50-75% allowing patient to perform normal functional activities with minimal pain or discomfort 09/23/17    Baseline  09/04/17: no change    Time  6    Period  Weeks    Status  On-going      PT LONG TERM GOAL #3   Title  Independent in HEP 09/23/17  Time  6    Period  Weeks    Status   On-going      PT LONG TERM GOAL #4   Title  Improve FOTO to </= 44% limitation 09/23/17    Baseline  09/04/17: 15% decline in functional limitation    Time  6    Period  Weeks    Status  On-going            Plan - 09/04/17 1510    Clinical Impression Statement  Pt reports pain at this time is unchanged, and actually at times progressing and worse than initial evaluation.  FOTO score represents a 15% decline in functional mobility.  Will send note to MD for additional recs.    Rehab Potential  Good    PT Frequency  2x / week    PT Duration  6 weeks    PT Treatment/Interventions  Patient/family education;ADLs/Self Care Home Management;Cryotherapy;Electrical Stimulation;Iontophoresis 4mg /ml Dexamethasone;Moist Heat;Ultrasound;Dry needling;Manual techniques;Neuromuscular re-education;Therapeutic activities;Therapeutic exercise    PT Next Visit Plan  core stabilization; strengthening; manual work and modalities as indicated -  manual work as indicated; see what DO says    Consulted and Agree with Plan of Care  Patient       Patient will benefit from skilled therapeutic intervention in order to improve the following deficits and impairments:  Postural dysfunction, Improper body mechanics, Pain, Increased fascial restricitons, Increased muscle spasms, Hypomobility, Decreased mobility, Decreased range of motion, Decreased activity tolerance  Visit Diagnosis: Pain in left leg  Other symptoms and signs involving the musculoskeletal system  Muscle weakness (generalized)     Problem List Patient Active Problem List   Diagnosis Date Noted  . Insomnia 03/26/2017  . Aortic atherosclerosis ([place]) 10/19/2016  . Pain medication agreement signed 06/05/2016  . Opioid dependence ([place]) 06/05/2016  . Diabetes mellitus without complication ([place]) 85/27/7824  . Obesity (BMI 30-39.9) 04/13/2016  . Rectal bleeding 11/23/2015  . Moderate episode of recurrent major depressive disorder ([place])  10/18/2015  . S/P right THA, AA 05/10/2015  . Recurrent left inguinal hernia 07/26/2014  . Chronic right hip pain 06/23/2014  . S/P bilateral inguinal hernia repair 10/30/2013  . Nodule of left lung 05/29/2013  . Bilateral inguinal hernia 05/28/2013  . Umbilical hernia 23/53/6144  . Major psychotic depression, recurrent ([place]) 12/16/2012  . Inadequate anticoagulation 04/29/2012  . Hypocalcemia 07/28/2010  . CAD (coronary artery disease)   . Hyperlipidemia associated with type 2 diabetes mellitus ([place])   . COPD (chronic obstructive pulmonary disease) ([place])   . Tobacco abuse       Laureen Abrahams, PT, DPT 09/04/17 3:12 PM    [facility] [place], [place], [address] Phone: [phone]   Fax:  [phone]  Name: Anthony [name]. MRN: [id number] Date of Birth: [birth year]

## 2017-09-04 NOTE — Telephone Encounter (Signed)
What is the name of the medication? Naproxen sodium  Have you contacted your pharmacy to request a refill? Yes  Which pharmacy would you like this sent to? [facility] [place], pt would also like to know what can he do to have all medications refilled at same time   Patient notified that their request is being sent to the clinical staff for review and that they should receive a call once it is complete. If they do not receive a call within 24 hours they can check with their pharmacy or our office.

## 2017-09-05 ENCOUNTER — Ambulatory Visit (INDEPENDENT_AMBULATORY_CARE_PROVIDER_SITE_OTHER): Payer: Medicare HMO | Admitting: Osteopathic Medicine

## 2017-09-05 ENCOUNTER — Encounter: Payer: Self-pay | Admitting: Osteopathic Medicine

## 2017-09-05 ENCOUNTER — Ambulatory Visit (INDEPENDENT_AMBULATORY_CARE_PROVIDER_SITE_OTHER): Payer: Medicare HMO

## 2017-09-05 VITALS — BP 115/73 | HR 91 | Temp 97.7°F | Ht 70.0 in | Wt 210.8 lb

## 2017-09-05 DIAGNOSIS — M25572 Pain in left ankle and joints of left foot: Secondary | ICD-10-CM | POA: Diagnosis not present

## 2017-09-05 DIAGNOSIS — G8929 Other chronic pain: Secondary | ICD-10-CM

## 2017-09-05 MED ORDER — DICLOFENAC SODIUM 75 MG PO TBEC: 75.0000 mg | DELAYED_RELEASE_TABLET | Freq: Two times a day (BID) | ORAL | 2 refills | Status: DC

## 2017-09-05 NOTE — Telephone Encounter (Signed)
Pt aware diclofenac sent to pharmacy

## 2017-09-05 NOTE — Progress Notes (Signed)
HPI: Anthony [name]. is a 67 y.o. male who  has a past medical history of Anxiety, Arthritis, Bipolar disorder ([place]), CAD (coronary artery disease), COPD (chronic obstructive pulmonary disease) ([place]), Depression, Glucose intolerance (impaired glucose tolerance), History of DVT (deep vein thrombosis), HLD (hyperlipidemia), Hypertension, Myocardial infarction ([place]), Shortness of breath, Tobacco abuse, and Urinary frequency.  he presents to [facility] today, 09/05/17,  for chief complaint of: L leg pain - second opinion   Patient here for second opinion on left leg pain.  Problem has been ongoing for months at this point.  First visit that I can see in the record was from 05/13/2017, saw PCP for leg pain.  That point was bilateral, described as aching, constant, related by movement and weightbearing.  At that point believed likely overuse injury, labs were fine, baclofen was prescribed.  Followed up for this a few weeks later, no significant improvement.  She was given compression stockings, referral to vascular surgery.  With vascular surgery 07/05/2017: Diagnosed with chronic venous insufficiency/claudication, sleep based on exam.  Clearly the right leg is affected.  Consider venous reflux study. ABI was normal at that time bilaterally.  Has a follow-up appointment with vascular surgery  Followed up again with PCP 07/15/2017, at that point started gabapentin for presumed neuropathic pain.  History of right lower extremity DVT about 10 to 12 years ago. Flow vascular disease: Diabetes, tobacco use up to 2 packs a day currently  Pt reports pain on lateral ankle extending up the lateral shin. No lower back pain.  He states there is a spot on the left ankle where there is a small ulcer, he is worried that he may have gotten a thorn or something in here that has gone inwards.  He does not have any pain that gets better with rest, though he reports that a good night of  sleep tends to help with discomfort.  Patient also reports medical history of heart attack and high cholesterol, he is really not on any of the secondary prevention medications for this other than atorvastatin.  Patient is still an active smoker, averaging 2 packs a day for 40 years.  Former alcohol use.  Reports colonoscopy 2 years ago which was normal.   Past medical, surgical, social and family history reviewed:  Patient Active Problem List   Diagnosis Date Noted  . Insomnia 03/26/2017  . Aortic atherosclerosis ([place]) 10/19/2016  . Pain medication agreement signed 06/05/2016  . Opioid dependence ([place]) 06/05/2016  . Diabetes mellitus without complication ([place]) 16/10/9602  . Obesity (BMI 30-39.9) 04/13/2016  . Rectal bleeding 11/23/2015  . Moderate episode of recurrent major depressive disorder ([place]) 10/18/2015  . S/P right THA, AA 05/10/2015  . Recurrent left inguinal hernia 07/26/2014  . Chronic right hip pain 06/23/2014  . S/P bilateral inguinal hernia repair 10/30/2013  . Nodule of left lung 05/29/2013  . Bilateral inguinal hernia 05/28/2013  . Umbilical hernia 54/09/8117  . Major psychotic depression, recurrent ([place]) 12/16/2012  . Inadequate anticoagulation 04/29/2012  . Hypocalcemia 07/28/2010  . CAD (coronary artery disease)   . Hyperlipidemia associated with type 2 diabetes mellitus ([place])   . COPD (chronic obstructive pulmonary disease) ([place])   . Tobacco abuse     Past Surgical History:  Procedure Laterality Date  . CARDIAC CATHETERIZATION    . COLONOSCOPY N/A 12/29/2015   Procedure: COLONOSCOPY;  Surgeon: Rogene Houston, MD;  [facility];  Service: Endoscopy;  Laterality: N/A;  12:88  . ELECTROCARDIOGRAM     Showed inferolateral ST-elevation and a code STEMI was activated. In the ER, he was treated with morphine, herarin, and 600 mg of Plavix. He was transferred emergently to [facility].   . INGUINAL HERNIA REPAIR Left 07/26/2014   Procedure:  OPEN REPAIR OF RECURENT LEFT INGUINAL HERNIA REPAIR WITH MESH;  Surgeon: Greer Pickerel, MD;  [facility];  Service: General;  Laterality: Left;  . INSERTION OF MESH Bilateral 10/30/2013   Procedure: INSERTION OF MESH;  Surgeon: Gayland Curry, MD;  [facility];  Service: General;  Laterality: Bilateral;  . LAPAROSCOPIC INGUINAL HERNIA WITH UMBILICAL HERNIA Bilateral 10/30/2013   Procedure: laparoscopic repair left pantaloom hernia with mesh, laparoscopic right inguinal hernia with mesh, OPEN REPAIR OF UMBILICAL HERNIA ;  Surgeon: Gayland Curry, MD;  [facility];  Service: General;  Laterality: Bilateral;  . LAPAROSCOPY N/A 07/26/2014   Procedure: LAPAROSCOPY DIAGNOSTIC;  Surgeon: Greer Pickerel, MD;  [facility];  Service: General;  Laterality: N/A;  . POLYPECTOMY  12/29/2015   Procedure: POLYPECTOMY;  Surgeon: Rogene Houston, MD;  [facility];  Service: Endoscopy;;  ascending colon, descending colon  . stent placement    . TOTAL HIP ARTHROPLASTY Right 05/10/2015   Procedure: RIGHT TOTAL HIP ARTHROPLASTY ANTERIOR APPROACH;  Surgeon: Paralee Cancel, MD;  [facility];  Service: Orthopedics;  Laterality: Right;  Marland Kitchen VASECTOMY      Social History   Tobacco Use  . Smoking status: Current Every Day Smoker    Packs/day: 0.50    Years: 45.00    Pack years: 22.50    Types: Cigarettes    Last attempt to quit: 10/26/2016    Years since quitting: 0.8  . Smokeless tobacco: Never Used  Substance Use Topics  . Alcohol use: No    Comment: past hx of ETOH use was in inpt facility per court order  - 20 years, 10-18-2015 per pt not anymore,per pt stopped about 15-20 yrs ago    Family History  Problem Relation Age of Onset  . Coronary artery disease Father   . Depression Father   . Depression Mother      Current medication list and allergy/intolerance information reviewed:    Current Outpatient Medications  Medication Sig Dispense Refill  . 5-Hydroxytryptophan (5-HTP  PO) Take 1 tablet by mouth at bedtime.    Marland Kitchen albuterol (PROVENTIL HFA;VENTOLIN HFA) 108 (90 Base) MCG/ACT inhaler Inhale 2 puffs into the lungs every 6 (six) hours as needed for wheezing or shortness of breath. 1 Inhaler 0  . aspirin EC 81 MG tablet Take 1 tablet (81 mg total) by mouth daily. 90 tablet 3  . atorvastatin (LIPITOR) 40 MG tablet Take 1 tablet (40 mg total) by mouth daily. 90 tablet 1  . baclofen (LIORESAL) 10 MG tablet Take 1 tablet (10 mg total) by mouth 2 (two) times daily as needed for muscle spasms. 60 tablet 2  . blood glucose meter kit and supplies KIT Dispense based on patient and insurance preference. Use to check BG once daily or every other other.  Dx: E11.9 1 each 0  . buPROPion (WELLBUTRIN SR) 150 MG 12 hr tablet TAKE 1 TABLET BY MOUTH TWICE DAILY TO HELP WITH SMOKING CESSATION 180 tablet 1  . Cyanocobalamin (VITAMIN B-12 PO) Take 1,000 mg by mouth at bedtime.     . diclofenac (VOLTAREN) 75 MG EC tablet Take 1 tablet (75 mg total) by mouth 2 (  two) times daily. 30 tablet 0  . fluticasone (FLONASE) 50 MCG/ACT nasal spray Place 2 sprays into both nostrils daily. 16 g 6  . Fluticasone-Umeclidin-Vilant (TRELEGY ELLIPTA) 100-62.5-25 MCG/INH AEPB Inhale 1 puff into the lungs daily. 1 each 3  . MELATONIN PO Take by mouth.    . metFORMIN (GLUCOPHAGE-XR) 500 MG 24 hr tablet Take 500 mg by mouth daily with breakfast.    . metoprolol tartrate (LOPRESSOR) 25 MG tablet TAKE ONE-HALF TABLET BY MOUTH TWICE DAILY (Patient taking differently: TAKE ONE-HALF (12.5 MG) TABLET BY MOUTH TWICE DAILY) 90 tablet 1  . sennosides-docusate sodium (SENOKOT-S) 8.6-50 MG tablet Take 1 tablet by mouth 3 (three) times daily as needed for constipation.     . temazepam (RESTORIL) 15 MG capsule TAKE 1 CAPSULE BY MOUTH AT BEDTIME AS NEEDED FOR SLEEP 30 capsule 2  . traMADol (ULTRAM) 50 MG tablet Take 1 tablet (50 mg total) by mouth every 12 (twelve) hours as needed for moderate pain or severe pain. 30 tablet 2   . VALERIAN ROOT PO Take 1 capsule by mouth at bedtime.      No current facility-administered medications for this visit.     Allergies  Allergen Reactions  . Flomax [Tamsulosin Hcl] Other (See Comments)    This medication makes patient feel sick  . Penicillins Other (See Comments)    Reaction unknown occurred during childhood Has patient had a PCN reaction causing immediate rash, facial/tongue/throat swelling, SOB or lightheadedness with hypotension: unsure - childhood reaction Has patient had a PCN reaction causing severe rash involving mucus membranes or skin necrosis: unsure - childhood reaction Has patient had a PCN reaction that required hospitalization no Has patient had a PCN reaction occurring within the last 10 years: no If all of the above answers are "NO", then may p      Review of Systems:  Constitutional:  No  fever, no chills, No recent illness, No unintentional weight changes. +significant fatigue.   HEENT: No  headache, no vision change, no hearing change, No sore throat, No  sinus pressure  Cardiac: No  chest pain, No  pressure, No palpitations, No  Orthopnea  Respiratory:  No  shortness of breath. No  Cough  Gastrointestinal: No  abdominal pain, No  nausea, No  vomiting,  No  blood in stool, +diarrhea, No  constipation   Musculoskeletal: +myalgia/arthralgia  Skin: No  Rash, No other wounds/concerning lesions  Genitourinary: No  incontinence, No  abnormal genital bleeding, No abnormal genital discharge  Hem/Onc: No  easy bruising/bleeding, No  abnormal lymph node  Endocrine: No cold intolerance,  No heat intolerance. No polyuria/polydipsia/polyphagia   Neurologic: No  weakness, No  dizziness, No  slurred speech/focal weakness/facial droop  Psychiatric: +history of depression/anxiety, No sleep problems, No mood problems  Exam:  BP 115/73 (BP Location: Left Arm, Patient Position: Sitting, Cuff Size: Normal)   Pulse 91   Temp 97.7 F (36.5 C) (Oral)    Ht _0  (1.778 m)   Wt 210 lb 12.8 oz (95.6 kg)   BMI 30.25 kg/m   Constitutional: VS see above. General Appearance: alert, well-developed, well-nourished, NAD  Eyes: Normal lids and conjunctive, non-icteric sclera  Ears, Nose, Mouth, Throat: MMM, Normal external inspection ears/nares/mouth/lips/gums. TM normal bilaterally. Pharynx/tonsils no erythema, no exudate. Nasal mucosa normal.   Neck: No masses, trachea midline. No thyroid enlargement. No tenderness/mass appreciated. No lymphadenopathy  Respiratory: Normal respiratory effort. no wheeze, no rhonchi, no rales  Cardiovascular: S1/S2 normal, no murmur,  no rub/gallop auscultated. RRR. No lower extremity edema. Pedal pulse II/IV bilaterally DP and PT. No carotid bruit or JVD. No abdominal aortic bruit.  Gastrointestinal: Nontender, no masses. No hepatomegaly, no splenomegaly. No hernia appreciated. Bowel sounds normal. Rectal exam deferred.   Musculoskeletal: Gait normal. No clubbing/cyanosis of digits.   Neurological: Normal balance/coordination. No tremor. No cranial nerve deficit on limited exam. Motor and sensation intact and symmetric. Cerebellar reflexes intact.   Skin: warm, dry, intact. PVD signs on lower extremities - dusky skin, hair loss.     Psychiatric: Normal judgment/insight. Normal mood and affect. Oriented x3.   X-ray personally reviewed, appreciate input from Dr. Georgina Snell.  Some arthritis but no major malalignment, fracture, foreign body.  ASSESSMENT/PLAN:   Chronic pain of left ankle - Plan: DG Ankle Complete Left   Appreciate Dr. Georgina Snell taking a look at this patient as well.  I think he may benefit from continuing physical therapy, current meds, f/u vascular (though ABI was ok), seems more MSK to me though and he will be f/u w/ him next week for complete evaluation.      Visit summary with medication list and pertinent instructions was printed for patient to review. All questions at time of visit were  answered - patient instructed to contact office with any additional concerns. ER/RTC precautions were reviewed with the patient.   Follow-up plan: Return for recheck with Dr Georgina Snell next week .    Please note: voice recognition software was used to produce this document, and typos may escape review. Please contact Dr. Sheppard Coil for any needed clarifications.

## 2017-09-05 NOTE — Telephone Encounter (Signed)
Voltaren Prescription sent to pharmacy. This is very similar to naprosyn. Can't be on both.

## 2017-09-10 ENCOUNTER — Other Ambulatory Visit: Payer: Self-pay | Admitting: Family

## 2017-09-10 ENCOUNTER — Ambulatory Visit (INDEPENDENT_AMBULATORY_CARE_PROVIDER_SITE_OTHER): Payer: Medicare HMO | Admitting: Physical Therapy

## 2017-09-10 DIAGNOSIS — G4700 Insomnia, unspecified: Secondary | ICD-10-CM

## 2017-09-10 DIAGNOSIS — R29898 Other symptoms and signs involving the musculoskeletal system: Secondary | ICD-10-CM | POA: Diagnosis not present

## 2017-09-10 DIAGNOSIS — M791 Myalgia, unspecified site: Secondary | ICD-10-CM

## 2017-09-10 DIAGNOSIS — M6281 Muscle weakness (generalized): Secondary | ICD-10-CM

## 2017-09-10 DIAGNOSIS — M79605 Pain in left leg: Secondary | ICD-10-CM | POA: Diagnosis not present

## 2017-09-10 NOTE — Therapy (Signed)
[place], [place], [address] Phone: [phone]   Fax:  [phone]  Physical Therapy Treatment  Patient Details  Name: Anthony [name]. MRN: [id number] Date of Birth: 01/06/1951 Referring Provider: [name], FN-P   Encounter Date: 09/10/2017  PT End of Session - 09/10/17 1054    Visit Number  8    Number of Visits  12    Date for PT Re-Evaluation  09/23/17    PT Start Time  4235    PT Stop Time  1108    PT Time Calculation (min)  53 min    Activity Tolerance  Patient tolerated treatment well    Behavior During Therapy  [facility] for tasks assessed/performed       Past Medical History:  Diagnosis Date  . Anxiety   . Arthritis   . Bipolar disorder ([place])   . CAD (coronary artery disease)    a. INF STEMI 07/04/10:  tx with thrombectomy + [facility] BMS to [facility];  cath 07/04/10: dLM 10-20%, pLAD 40-50%, mLAD 20-30%, pRCA 30%, mRCA occluded and tx with PCI, EF 50% with inf HK. A Multilink  . COPD (chronic obstructive pulmonary disease) ([place])   . Depression   . Glucose intolerance (impaired glucose tolerance)    A1c 6.2 06/2010  . History of DVT (deep vein thrombosis)    traumatic, s/p coumadin tx.  Marland Kitchen HLD (hyperlipidemia)   . Hypertension    pt states is currently on no medications  . Myocardial infarction ([place])    around 2013  . Shortness of breath    walking distance or climbing stairs  . Tobacco abuse   . Urinary frequency     Past Surgical History:  Procedure Laterality Date  . CARDIAC CATHETERIZATION    . COLONOSCOPY N/A 12/29/2015   Procedure: COLONOSCOPY;  Surgeon: Rogene Houston, MD;  [facility];  Service: Endoscopy;  Laterality: N/A;  12:55  . ELECTROCARDIOGRAM     Showed inferolateral ST-elevation and a code STEMI was activated. In the ER, he was treated with morphine, herarin, and 600 mg of Plavix. He was transferred emergently to [facility].   . INGUINAL  HERNIA REPAIR Left 07/26/2014   Procedure: OPEN REPAIR OF RECURENT LEFT INGUINAL HERNIA REPAIR WITH MESH;  Surgeon: Greer Pickerel, MD;  [facility];  Service: General;  Laterality: Left;  . INSERTION OF MESH Bilateral 10/30/2013   Procedure: INSERTION OF MESH;  Surgeon: Gayland Curry, MD;  [facility];  Service: General;  Laterality: Bilateral;  . LAPAROSCOPIC INGUINAL HERNIA WITH UMBILICAL HERNIA Bilateral 10/30/2013   Procedure: laparoscopic repair left pantaloom hernia with mesh, laparoscopic right inguinal hernia with mesh, OPEN REPAIR OF UMBILICAL HERNIA ;  Surgeon: Gayland Curry, MD;  [facility];  Service: General;  Laterality: Bilateral;  . LAPAROSCOPY N/A 07/26/2014   Procedure: LAPAROSCOPY DIAGNOSTIC;  Surgeon: Greer Pickerel, MD;  [facility];  Service: General;  Laterality: N/A;  . POLYPECTOMY  12/29/2015   Procedure: POLYPECTOMY;  Surgeon: Rogene Houston, MD;  [facility];  Service: Endoscopy;;  ascending colon, descending colon  . stent placement    . TOTAL HIP ARTHROPLASTY Right 05/10/2015   Procedure: RIGHT TOTAL HIP ARTHROPLASTY ANTERIOR APPROACH;  Surgeon: Paralee Cancel, MD;  [facility];  Service: Orthopedics;  Laterality: Right;  Marland Kitchen VASECTOMY      There were no vitals filed for this visit.  Subjective Assessment -  09/10/17 1018    Subjective  Pt reports he has had a stressful couple of days.  Usually by 12-1pm, his leg is quite bothersome to where he can't walk on it.     Patient Stated Goals  get rid of the pain     Currently in Pain?  Yes    Pain Score  1    pain meds 8:45am   Pain Location  Leg    Pain Orientation  Left    Aggravating Factors   worse as day goes on    Pain Relieving Factors  ice/heat, TENS, rest.          [facility] PT Assessment - 09/10/17 0001      Assessment   Medical Diagnosis  Lt LE pain     Referring Provider  [name], FN-P    Onset Date/Surgical Date  02/08/17   increased symptoms in the past 2 months     Hand Dominance  Right    Next MD Visit  PRN        [facility] Adult PT Treatment/Exercise - 09/10/17 0001      Lumbar Exercises: Stretches   Single Knee to Chest Stretch  Right;Left;2 reps;60 seconds   seated   Piriformis Stretch  Right;Left;2 reps;30 seconds    Piriformis Stretch Limitations  knee to opposite shoulder, seated x 30 sec x 2 reps      Lumbar Exercises: Supine   Ab Set  5 reps;5 seconds    Bridge  20 reps;5 seconds      Lumbar Exercises: Sidelying   Clam  Right;Left;10 reps      Lumbar Exercises: Prone   Opposite Arm/Leg Raise  Right arm/Left leg;Left arm/Right leg;5 reps;2 seconds   improved tolerance and form     Knee/Hip Exercises: Stretches   Passive Hamstring Stretch  Left;Right;30 seconds;2 reps   seated   Piriformis Stretch  Right;Left;2 reps;20 seconds    Gastroc Stretch  Left;Right;2 reps;30 seconds      Knee/Hip Exercises: Aerobic   Nustep  NuStep L5 x  6 min    PTA present to discuss progress and monitor     Modalities   Modalities  Electrical Stimulation;Cryotherapy      Moist Heat Therapy   Number Minutes Moist Heat  15 Minutes    Moist Heat Location  Hip;Lumbar Spine      Cryotherapy   Number Minutes Cryotherapy  15 Minutes   request per pt   Cryotherapy Location  Lumbar Spine    Type of Cryotherapy  Ice pack      Electrical Stimulation   Electrical Stimulation Location  bilat lumbar musculature     Electrical Stimulation Action  IFC    Electrical Stimulation Parameters  to tolerance    Electrical Stimulation Goals  Pain           PT Long Term Goals - 09/10/17 1042      PT LONG TERM GOAL #1   Title  Improve LE moblity and ROM with patient to demonstrate increased Lt ankle DF to =/> 10 deg 09/23/17    Time  6    Period  Weeks    Status  On-going      PT LONG TERM GOAL #2   Title  Decrease Lt LE pain by 50-75% allowing patient to perform normal functional activities with minimal pain or discomfort 09/23/17    Time  6    Period   Weeks    Status  On-going  no change     PT LONG TERM GOAL #3   Title  Independent in HEP 09/23/17    Time  6    Period  Weeks    Status  On-going      PT LONG TERM GOAL #4   Title  Improve FOTO to </= 44% limitation 09/23/17    Baseline  09/04/17: 15% decline in functional limitation    Time  6    Period  Weeks            Plan - 09/10/17 1056    Clinical Impression Statement  Pt tolerated exercises better today than last visit.  He is reporting improved mobility/ flexibility, even though his symptoms in LE persist.  Pt has had limited progress towards goals this visit.     Rehab Potential  Good    PT Frequency  2x / week    PT Duration  6 weeks    PT Treatment/Interventions  Patient/family education;ADLs/Self Care Home Management;Cryotherapy;Electrical Stimulation;Iontophoresis 4mg /ml Dexamethasone;Moist Heat;Ultrasound;Dry needling;Manual techniques;Neuromuscular re-education;Therapeutic activities;Therapeutic exercise    PT Next Visit Plan  core stabilization; strengthening; manual work and modalities as indicated.     Consulted and Agree with Plan of Care  Patient       Patient will benefit from skilled therapeutic intervention in order to improve the following deficits and impairments:  Postural dysfunction, Improper body mechanics, Pain, Increased fascial restricitons, Increased muscle spasms, Hypomobility, Decreased mobility, Decreased range of motion, Decreased activity tolerance  Visit Diagnosis: Pain in left leg  Other symptoms and signs involving the musculoskeletal system  Muscle weakness (generalized)     Problem List Patient Active Problem List   Diagnosis Date Noted  . Insomnia 03/26/2017  . Aortic atherosclerosis ([place]) 10/19/2016  . Pain medication agreement signed 06/05/2016  . Opioid dependence ([place]) 06/05/2016  . Diabetes mellitus without complication ([place]) 62/69/4854  . Obesity (BMI 30-39.9) 04/13/2016  . Rectal bleeding 11/23/2015  . Moderate  episode of recurrent major depressive disorder ([place]) 10/18/2015  . S/P right THA, AA 05/10/2015  . Recurrent left inguinal hernia 07/26/2014  . Chronic right hip pain 06/23/2014  . S/P bilateral inguinal hernia repair 10/30/2013  . Nodule of left lung 05/29/2013  . Bilateral inguinal hernia 05/28/2013  . Umbilical hernia 62/70/3500  . Major psychotic depression, recurrent ([place]) 12/16/2012  . Inadequate anticoagulation 04/29/2012  . Hypocalcemia 07/28/2010  . CAD (coronary artery disease)   . Hyperlipidemia associated with type 2 diabetes mellitus ([place])   . COPD (chronic obstructive pulmonary disease) ([place])   . Tobacco abuse    Kerin Perna, PTA 09/10/17 11:01 AM  [place], [place], [address] Phone: [phone]   Fax:  [phone]  Name: Cassie Henkels. MRN: [id number] Date of Birth: 1950/12/28

## 2017-09-12 ENCOUNTER — Ambulatory Visit: Payer: Self-pay | Admitting: Family Medicine

## 2017-09-12 ENCOUNTER — Ambulatory Visit (INDEPENDENT_AMBULATORY_CARE_PROVIDER_SITE_OTHER): Payer: Medicare HMO | Admitting: Physical Therapy

## 2017-09-12 ENCOUNTER — Encounter: Payer: Self-pay | Admitting: Physical Therapy

## 2017-09-12 DIAGNOSIS — M6281 Muscle weakness (generalized): Secondary | ICD-10-CM

## 2017-09-12 DIAGNOSIS — M79605 Pain in left leg: Secondary | ICD-10-CM

## 2017-09-12 DIAGNOSIS — R29898 Other symptoms and signs involving the musculoskeletal system: Secondary | ICD-10-CM | POA: Diagnosis not present

## 2017-09-12 NOTE — Therapy (Signed)
[place], [place], [address] Phone: [phone]   Fax:  [phone]  Physical Therapy Treatment  Patient Details  Name: Anthony [name]. MRN: [id number] Date of Birth: 12/05/1950 Referring Provider: [name], FN-P   Encounter Date: 09/12/2017  PT End of Session - 09/12/17 1536    Visit Number  9    Number of Visits  12    Date for PT Re-Evaluation  09/23/17    PT Start Time  1450    PT Stop Time  1548    PT Time Calculation (min)  58 min    Activity Tolerance  Patient limited by pain    Behavior During Therapy  [facility] for tasks assessed/performed       Past Medical History:  Diagnosis Date  . Anxiety   . Arthritis   . Bipolar disorder ([place])   . CAD (coronary artery disease)    a. INF STEMI 07/04/10:  tx with thrombectomy + [facility] BMS to [facility];  cath 07/04/10: dLM 10-20%, pLAD 40-50%, mLAD 20-30%, pRCA 30%, mRCA occluded and tx with PCI, EF 50% with inf HK. A Multilink  . COPD (chronic obstructive pulmonary disease) ([place])   . Depression   . Glucose intolerance (impaired glucose tolerance)    A1c 6.2 06/2010  . History of DVT (deep vein thrombosis)    traumatic, s/p coumadin tx.  Marland Kitchen HLD (hyperlipidemia)   . Hypertension    pt states is currently on no medications  . Myocardial infarction ([place])    around 2013  . Shortness of breath    walking distance or climbing stairs  . Tobacco abuse   . Urinary frequency     Past Surgical History:  Procedure Laterality Date  . CARDIAC CATHETERIZATION    . COLONOSCOPY N/A 12/29/2015   Procedure: COLONOSCOPY;  Surgeon: Rogene Houston, MD;  [facility];  Service: Endoscopy;  Laterality: N/A;  12:55  . ELECTROCARDIOGRAM     Showed inferolateral ST-elevation and a code STEMI was activated. In the ER, he was treated with morphine, herarin, and 600 mg of Plavix. He was transferred emergently to [facility].   . INGUINAL HERNIA REPAIR  Left 07/26/2014   Procedure: OPEN REPAIR OF RECURENT LEFT INGUINAL HERNIA REPAIR WITH MESH;  Surgeon: Greer Pickerel, MD;  [facility];  Service: General;  Laterality: Left;  . INSERTION OF MESH Bilateral 10/30/2013   Procedure: INSERTION OF MESH;  Surgeon: Gayland Curry, MD;  [facility];  Service: General;  Laterality: Bilateral;  . LAPAROSCOPIC INGUINAL HERNIA WITH UMBILICAL HERNIA Bilateral 10/30/2013   Procedure: laparoscopic repair left pantaloom hernia with mesh, laparoscopic right inguinal hernia with mesh, OPEN REPAIR OF UMBILICAL HERNIA ;  Surgeon: Gayland Curry, MD;  [facility];  Service: General;  Laterality: Bilateral;  . LAPAROSCOPY N/A 07/26/2014   Procedure: LAPAROSCOPY DIAGNOSTIC;  Surgeon: Greer Pickerel, MD;  [facility];  Service: General;  Laterality: N/A;  . POLYPECTOMY  12/29/2015   Procedure: POLYPECTOMY;  Surgeon: Rogene Houston, MD;  [facility];  Service: Endoscopy;;  ascending colon, descending colon  . stent placement    . TOTAL HIP ARTHROPLASTY Right 05/10/2015   Procedure: RIGHT TOTAL HIP ARTHROPLASTY ANTERIOR APPROACH;  Surgeon: Paralee Cancel, MD;  [facility];  Service: Orthopedics;  Laterality: Right;  Marland Kitchen VASECTOMY      There were no vitals filed for this visit.  Subjective Assessment -  09/12/17 1457    Subjective  Pt reports he has taken the day off, and rested since yesterday he did some work with concrete (lifting 80# bag).  That tweaked his back a little during this work.  Rest has helped.  He has only done the calf stretch, as far as HEP goes.     Patient Stated Goals  get rid of the pain     Currently in Pain?  Yes    Pain Score  1     Pain Location  Leg    Pain Orientation  Left;Lower    Pain Descriptors / Indicators  Aching;Tightness    Aggravating Factors   worse as day goes on    Pain Relieving Factors  ice/heat, TENS, rest.          [facility] PT Assessment - 09/12/17 0001      Assessment   Medical Diagnosis  Lt LE  pain     Referring Provider  [name], FN-P    Onset Date/Surgical Date  02/08/17   increased symptoms in the past 2 months    Hand Dominance  Right    Next MD Visit  PRN         [facility] Adult PT Treatment/Exercise - 09/12/17 0001      Exercises   Exercises  Lumbar      Lumbar Exercises: Stretches   Passive Hamstring Stretch  Right;Left;2 reps;30 seconds    Piriformis Stretch  Right;Left;2 reps;20 seconds      Lumbar Exercises: Aerobic   Stationary Bike  L2: 6 min       Lumbar Exercises: Standing   Row  Both;15 reps;Theraband    Theraband Level (Row)  Level 2 (Red)    Shoulder Extension  Strengthening;Both;10 reps;Theraband    Theraband Level (Shoulder Extension)  Level 2 (Red)   cues for core engagment   Other Standing Lumbar Exercises  resisted side stepping holding red band and core engaged x 8 to Rt, 5 to Lt.       Knee/Hip Exercises: Stretches   Press photographer  Left;Right;30 seconds;3 reps      Cryotherapy   Number Minutes Cryotherapy  20 Minutes    Cryotherapy Location  Lumbar Spine    Type of Cryotherapy  Ice pack      Electrical Stimulation   Electrical Stimulation Location  bilat lumbar musculature     Electrical Stimulation Action  IFC    Electrical Stimulation Parameters  to tolerance    Electrical Stimulation Goals  Pain      Manual Therapy   Soft tissue mobilization  STM through Lt lumbar musculature, including cross fiber friction and deep tissue.                    PT Long Term Goals - 09/12/17 1501      PT LONG TERM GOAL #1   Title  Improve LE moblity and ROM with patient to demonstrate increased Lt ankle DF to =/> 10 deg 09/23/17    Time  6    Period  Weeks    Status  On-going      PT LONG TERM GOAL #2   Title  Decrease Lt LE pain by 50-75% allowing patient to perform normal functional activities with minimal pain or discomfort 09/23/17    Time  6    Period  Weeks    Status  On-going      PT LONG TERM GOAL #3   Title  Independent in HEP 09/23/17    Time  6    Period  Weeks    Status  On-going      PT LONG TERM GOAL #4   Title  Improve FOTO to </= 44% limitation 09/23/17    Baseline  09/04/17: 15% decline in functional limitation    Time  6    Period  Weeks    Status  On-going            Plan - 09/12/17 1506    Clinical Impression Statement  Spoke to pt at length encouraging him to comply with HEP to assist in progressing towards goals. He had limited tolerance for standing core strengthening; reported increased pain in LLE up to 2/10.  This was reduced with seated rest breaks in between exercises.  Continued palpable tightness through Lt lumbar musculature.  Pt reported reduction of pain by end of session after manual and estim/Ice.  Pt has made very gradual progress towards goals.     Rehab Potential  Good    PT Frequency  2x / week    PT Duration  6 weeks    PT Treatment/Interventions  Patient/family education;ADLs/Self Care Home Management;Cryotherapy;Electrical Stimulation;Iontophoresis 4mg /ml Dexamethasone;Moist Heat;Ultrasound;Dry needling;Manual techniques;Neuromuscular re-education;Therapeutic activities;Therapeutic exercise    PT Next Visit Plan  10th visit progress note.     Consulted and Agree with Plan of Care  Patient       Patient will benefit from skilled therapeutic intervention in order to improve the following deficits and impairments:  Postural dysfunction, Improper body mechanics, Pain, Increased fascial restricitons, Increased muscle spasms, Hypomobility, Decreased mobility, Decreased range of motion, Decreased activity tolerance  Visit Diagnosis: Pain in left leg  Other symptoms and signs involving the musculoskeletal system  Muscle weakness (generalized)     Problem List Patient Active Problem List   Diagnosis Date Noted  . Insomnia 03/26/2017  . Aortic atherosclerosis ([place]) 10/19/2016  . Pain medication agreement signed 06/05/2016  . Opioid dependence ([place])  06/05/2016  . Diabetes mellitus without complication ([place]) 80/03/4915  . Obesity (BMI 30-39.9) 04/13/2016  . Rectal bleeding 11/23/2015  . Moderate episode of recurrent major depressive disorder ([place]) 10/18/2015  . S/P right THA, AA 05/10/2015  . Recurrent left inguinal hernia 07/26/2014  . Chronic right hip pain 06/23/2014  . S/P bilateral inguinal hernia repair 10/30/2013  . Nodule of left lung 05/29/2013  . Bilateral inguinal hernia 05/28/2013  . Umbilical hernia 91/50/5697  . Major psychotic depression, recurrent ([place]) 12/16/2012  . Inadequate anticoagulation 04/29/2012  . Hypocalcemia 07/28/2010  . CAD (coronary artery disease)   . Hyperlipidemia associated with type 2 diabetes mellitus ([place])   . COPD (chronic obstructive pulmonary disease) ([place])   . Tobacco abuse    Kerin Perna, [place] 09/12/17 3:56 PM  [place], [place], [address] Phone: [phone]   Fax:  [phone]  Name: Anthony [name]. MRN: [id number] Date of Birth: 13-Oct-1950

## 2017-09-12 NOTE — Telephone Encounter (Signed)
Last seen 7/19   

## 2017-09-16 ENCOUNTER — Ambulatory Visit (INDEPENDENT_AMBULATORY_CARE_PROVIDER_SITE_OTHER): Payer: Medicare HMO | Admitting: Physical Therapy

## 2017-09-16 ENCOUNTER — Ambulatory Visit (INDEPENDENT_AMBULATORY_CARE_PROVIDER_SITE_OTHER): Payer: Medicare HMO

## 2017-09-16 ENCOUNTER — Ambulatory Visit (INDEPENDENT_AMBULATORY_CARE_PROVIDER_SITE_OTHER): Payer: Medicare HMO | Admitting: Family Medicine

## 2017-09-16 ENCOUNTER — Other Ambulatory Visit: Payer: Self-pay | Admitting: Family

## 2017-09-16 ENCOUNTER — Encounter: Payer: Self-pay | Admitting: Family Medicine

## 2017-09-16 VITALS — BP 131/79 | HR 62 | Wt 211.0 lb

## 2017-09-16 DIAGNOSIS — M79605 Pain in left leg: Secondary | ICD-10-CM

## 2017-09-16 DIAGNOSIS — M25551 Pain in right hip: Principal | ICD-10-CM

## 2017-09-16 DIAGNOSIS — M545 Low back pain: Secondary | ICD-10-CM | POA: Diagnosis not present

## 2017-09-16 DIAGNOSIS — Z0289 Encounter for other administrative examinations: Secondary | ICD-10-CM

## 2017-09-16 DIAGNOSIS — R29898 Other symptoms and signs involving the musculoskeletal system: Secondary | ICD-10-CM

## 2017-09-16 DIAGNOSIS — I700 Atherosclerosis of aorta: Secondary | ICD-10-CM | POA: Diagnosis not present

## 2017-09-16 DIAGNOSIS — G8929 Other chronic pain: Secondary | ICD-10-CM

## 2017-09-16 DIAGNOSIS — F1120 Opioid dependence, uncomplicated: Secondary | ICD-10-CM

## 2017-09-16 DIAGNOSIS — M48061 Spinal stenosis, lumbar region without neurogenic claudication: Secondary | ICD-10-CM | POA: Diagnosis not present

## 2017-09-16 DIAGNOSIS — M5416 Radiculopathy, lumbar region: Secondary | ICD-10-CM | POA: Diagnosis not present

## 2017-09-16 DIAGNOSIS — M6281 Muscle weakness (generalized): Secondary | ICD-10-CM

## 2017-09-16 DIAGNOSIS — M79604 Pain in right leg: Secondary | ICD-10-CM

## 2017-09-16 MED ORDER — GABAPENTIN 300 MG PO CAPS: ORAL_CAPSULE | ORAL | 3 refills | Status: DC

## 2017-09-16 NOTE — Telephone Encounter (Signed)
Last office 08/01/2017

## 2017-09-16 NOTE — Therapy (Addendum)
[place], [place], [address] Phone: [phone]   Fax:  [phone]  Physical Therapy Treatment  Progress Note Reporting Period 08/12/17 to 10/28/17  See note below for Objective Data and Assessment of Progress/Goals.      Patient Details  Name: Anthony [name]. MRN: [id number] Date of Birth: [birth year] Referring Provider: [name], FN-P   Encounter Date: 09/16/2017  PT End of Session - 09/16/17 1522    Visit Number  10    Number of Visits  12    Date for PT Re-Evaluation  09/23/17    PT Start Time  1518    PT Stop Time  1027   pt requested to leave early due to prior committment   PT Time Calculation (min)  35 min    Activity Tolerance  Patient tolerated treatment well;No increased pain    Behavior During Therapy  WFL for tasks assessed/performed       Past Medical History:  Diagnosis Date  . Anxiety   . Arthritis   . Bipolar disorder ([place])   . CAD (coronary artery disease)    a. INF STEMI 07/04/10:  tx with thrombectomy + [facility] to [facility];  cath 07/04/10: dLM 10-20%, pLAD 40-50%, mLAD 20-30%, pRCA 30%, mRCA occluded and tx with PCI, EF 50% with inf HK. A Multilink  . COPD (chronic obstructive pulmonary disease) ([place])   . Depression   . Glucose intolerance (impaired glucose tolerance)    A1c 6.2 06/2010  . History of DVT (deep vein thrombosis)    traumatic, s/p coumadin tx.  Marland Kitchen HLD (hyperlipidemia)   . Hypertension    pt states is currently on no medications  . Myocardial infarction ([place])    around 2013  . Shortness of breath    walking distance or climbing stairs  . Tobacco abuse   . Urinary frequency     Past Surgical History:  Procedure Laterality Date  . CARDIAC CATHETERIZATION    . COLONOSCOPY N/A 12/29/2015   Procedure: COLONOSCOPY;  Surgeon: Rogene Houston, MD;  [facility];  Service: Endoscopy;  Laterality: N/A;  12:55  . ELECTROCARDIOGRAM      Showed inferolateral ST-elevation and a code STEMI was activated. In the ER, he was treated with morphine, herarin, and 600 mg of Plavix. He was transferred emergently to [facility].   . INGUINAL HERNIA REPAIR Left 07/26/2014   Procedure: OPEN REPAIR OF RECURENT LEFT INGUINAL HERNIA REPAIR WITH MESH;  Surgeon: Greer Pickerel, MD;  Location: [facility];  Service: General;  Laterality: Left;  . INSERTION OF MESH Bilateral 10/30/2013   Procedure: INSERTION OF MESH;  Surgeon: Gayland Curry, MD;  Location: [facility];  Service: General;  Laterality: Bilateral;  . LAPAROSCOPIC INGUINAL HERNIA WITH UMBILICAL HERNIA Bilateral 10/30/2013   Procedure: laparoscopic repair left pantaloom hernia with mesh, laparoscopic right inguinal hernia with mesh, OPEN REPAIR OF UMBILICAL HERNIA ;  Surgeon: Gayland Curry, MD;  Location: [facility];  Service: General;  Laterality: Bilateral;  . LAPAROSCOPY N/A 07/26/2014   Procedure: LAPAROSCOPY DIAGNOSTIC;  Surgeon: Greer Pickerel, MD;  Location: [facility];  Service: General;  Laterality: N/A;  . POLYPECTOMY  12/29/2015   Procedure: POLYPECTOMY;  Surgeon: Rogene Houston, MD;  [facility];  Service: Endoscopy;;  ascending colon, descending colon  . stent placement    . TOTAL HIP ARTHROPLASTY Right 05/10/2015   Procedure: RIGHT TOTAL HIP ARTHROPLASTY ANTERIOR APPROACH;  Surgeon: Paralee Cancel, MD;  Location: [facility];  Service: Orthopedics;  Laterality: Right;  Marland Kitchen VASECTOMY      There were no vitals filed for this visit.  Subjective Assessment - 09/16/17 1522    Subjective  Pt reports he rested well and the pain in his LE was gone.  Yesterday his leg didn't start hurting until 6-8pm.  He had a xray today of low back. and awaits appt with vascular surgeon.   He has to leave session early due to having to pick grandson up from school.      Patient Stated Goals  get rid of the pain     Currently in Pain?  Yes    Pain Score  2     Pain Location  Leg    Pain Orientation   Left;Lower    Aggravating Factors   worse as day goes on    Pain Relieving Factors  ice/heat, TENS, rest         [facility] PT Assessment - 09/16/17 0001      Assessment   Medical Diagnosis  Lt LE pain     Referring Provider  [name], FN-P    Onset Date/Surgical Date  02/08/17   increased symptoms in the past 2 months    Hand Dominance  Right    Next MD Visit  PRN        [facility] Adult PT Treatment/Exercise - 09/16/17 0001      Lumbar Exercises: Stretches   Passive Hamstring Stretch  Right;Left;2 reps;30 seconds   supine with strap   Prone on Elbows Stretch  3 reps;30 seconds    Piriformis Stretch  Right;Left;2 reps;30 seconds    Piriformis Stretch Limitations  knee to opposite shoulder, seated x 30 sec x 2 reps      Lumbar Exercises: Aerobic   Stationary Bike  --   held due to short appt     Lumbar Exercises: Supine   Bridge  10 reps;5 seconds   core engaged.      Lumbar Exercises: Prone   Opposite Arm/Leg Raise  Right arm/Left leg;Left arm/Right leg;5 reps;3 seconds      Lumbar Exercises: Quadruped   [place]  5 reps    Other Quadruped Lumbar Exercises  attempt at child's pose, limited tolerance in LE.       Moist Heat Therapy   Number Minutes Moist Heat  12 Minutes    Moist Heat Location  Lumbar Spine      Electrical Stimulation   Electrical Stimulation Location  bilat lumbar musculature     Electrical Stimulation Action  IFC    Electrical Stimulation Parameters   to tolerance    Electrical Stimulation Goals  Pain                  PT Long Term Goals - 09/12/17 1501      PT LONG TERM GOAL #1   Title  Improve LE moblity and ROM with patient to demonstrate increased Lt ankle DF to =/> 10 deg 09/23/17    Time  6    Period  Weeks    Status  On-going      PT LONG TERM GOAL #2   Title  Decrease Lt LE pain by 50-75% allowing patient to perform normal functional activities with minimal pain or discomfort 09/23/17    Time  6    Period  Weeks     Status  On-going      PT LONG  TERM GOAL #3   Title  Independent in HEP 09/23/17    Time  6    Period  Weeks    Status  On-going      PT LONG TERM GOAL #4   Title  Improve FOTO to </= 44% limitation 09/23/17    Baseline  09/04/17: 15% decline in functional limitation    Time  6    Period  Weeks    Status  On-going            Plan - 09/16/17 1528    Clinical Impression Statement  Pt now reporting 30% improvement in LE symptoms.  He was able to tolerate exercises in prone/ sidelying much better than standing exercises (which increased symptoms immediately).  He reported slight decrease in LLE pain with exercises and further reduction with MHP /estim to low back at end of session.  Continued encouragement given for improved compliance of HEP to assist in improved mobility/ decreased pain. Pt has had very gradual progress towards goals.     Rehab Potential  Good    PT Frequency  2x / week    PT Duration  6 weeks    PT Treatment/Interventions  Patient/family education;ADLs/Self Care Home Management;Cryotherapy;Electrical Stimulation;Iontophoresis 4mg /ml Dexamethasone;Moist Heat;Ultrasound;Dry needling;Manual techniques;Neuromuscular re-education;Therapeutic activities;Therapeutic exercise    PT Next Visit Plan  continue spinal stabilization.     Consulted and Agree with Plan of Care  Patient       Patient will benefit from skilled therapeutic intervention in order to improve the following deficits and impairments:  Postural dysfunction, Improper body mechanics, Pain, Increased fascial restricitons, Increased muscle spasms, Hypomobility, Decreased mobility, Decreased range of motion, Decreased activity tolerance  Visit Diagnosis: Pain in left leg  Other symptoms and signs involving the musculoskeletal system  Muscle weakness (generalized)     Problem List Patient Active Problem List   Diagnosis Date Noted  . Insomnia 03/26/2017  . Aortic atherosclerosis ([place]) 10/19/2016  .  Pain medication agreement signed 06/05/2016  . Opioid dependence ([place]) 06/05/2016  . Diabetes mellitus without complication ([place]) 33/29/5188  . Obesity (BMI 30-39.9) 04/13/2016  . Rectal bleeding 11/23/2015  . Moderate episode of recurrent major depressive disorder ([place]) 10/18/2015  . S/P right THA, AA 05/10/2015  . Recurrent left inguinal hernia 07/26/2014  . Chronic right hip pain 06/23/2014  . S/P bilateral inguinal hernia repair 10/30/2013  . Nodule of left lung 05/29/2013  . Bilateral inguinal hernia 05/28/2013  . Umbilical hernia 41/66/0630  . Major psychotic depression, recurrent ([place]) 12/16/2012  . Inadequate anticoagulation 04/29/2012  . Hypocalcemia 07/28/2010  . CAD (coronary artery disease)   . Hyperlipidemia associated with type 2 diabetes mellitus ([place])   . COPD (chronic obstructive pulmonary disease) ([place])   . Tobacco abuse    Kerin Perna, PTA 09/16/17 3:50 PM  Celyn P. Helene Kelp PT, MPH 09/16/17 5:30 PM   [place], [place], [address] Phone: [phone]   Fax:  [phone]  Name: Anthony [name]. MRN: [id number] Date of Birth: [birth year]

## 2017-09-16 NOTE — Patient Instructions (Signed)
Thank you for coming in today. Continue PT.  Get your leg blood flow studies done this week.  Start or increase gabapentin to 300mg  three times daily for leg pain as needed.  Get xray on the way out  Recheck with me in 3 weeks.   I will communicate with Dr Trudie Reed and Dr Sheppard Coil.  Bring all you medicine bottles to the next visit.    Sciatica Sciatica is pain, numbness, weakness, or tingling along the path of the sciatic nerve. The sciatic nerve starts in the lower back and runs down the back of each leg. The nerve controls the muscles in the lower leg and in the back of the knee. It also provides feeling (sensation) to the back of the thigh, the lower leg, and the sole of the foot. Sciatica is a symptom of another medical condition that pinches or puts pressure on the sciatic nerve. Generally, sciatica only affects one side of the body. Sciatica usually goes away on its own or with treatment. In some cases, sciatica may keep coming back (recur). What are the causes? This condition is caused by pressure on the sciatic nerve, or pinching of the sciatic nerve. This may be the result of:  A disk in between the bones of the spine (vertebrae) bulging out too far (herniated disk).  Age-related changes in the spinal disks (degenerative disk disease).  A pain disorder that affects a muscle in the buttock (piriformis syndrome).  Extra bone growth (bone spur) near the sciatic nerve.  An injury or break (fracture) of the pelvis.  Pregnancy.  Tumor (rare).  What increases the risk? The following factors may make you more likely to develop this condition:  Playing sports that place pressure or stress on the spine, such as football or weight lifting.  Having poor strength and flexibility.  A history of back injury.  A history of back surgery.  Sitting for long periods of time.  Doing activities that involve repetitive bending or lifting.  Obesity.  What are the signs or  symptoms? Symptoms can vary from mild to very severe, and they may include:  Any of these problems in the lower back, leg, hip, or buttock: ? Mild tingling or dull aches. ? Burning sensations. ? Sharp pains.  Numbness in the back of the calf or the sole of the foot.  Leg weakness.  Severe back pain that makes movement difficult.  These symptoms may get worse when you cough, sneeze, or laugh, or when you sit or stand for long periods of time. Being overweight may also make symptoms worse. In some cases, symptoms may recur over time. How is this diagnosed? This condition may be diagnosed based on:  Your symptoms.  A physical exam. Your health care provider may ask you to do certain movements to check whether those movements trigger your symptoms.  You may have tests, including: ? Blood tests. ? X-rays. ? MRI. ? CT scan.  How is this treated? In many cases, this condition improves on its own, without any treatment. However, treatment may include:  Reducing or modifying physical activity during periods of pain.  Exercising and stretching to strengthen your abdomen and improve the flexibility of your spine.  Icing and applying heat to the affected area.  Medicines that help: ? To relieve pain and swelling. ? To relax your muscles.  Injections of medicines that help to relieve pain, irritation, and inflammation around the sciatic nerve (steroids).  Surgery.  Follow these instructions at home: Medicines  Take over-the-counter and prescription medicines only as told by your health care provider.  Do not drive or operate heavy machinery while taking prescription pain medicine. Managing pain  If directed, apply ice to the affected area. ? Put ice in a plastic bag. ? Place a towel between your skin and the bag. ? Leave the ice on for 20 minutes, 2-3 times a day.  After icing, apply heat to the affected area before you exercise or as often as told by your health care  provider. Use the heat source that your health care provider recommends, such as a moist heat pack or a heating pad. ? Place a towel between your skin and the heat source. ? Leave the heat on for 20-30 minutes. ? Remove the heat if your skin turns bright red. This is especially important if you are unable to feel pain, heat, or cold. You may have a greater risk of getting burned. Activity  Return to your normal activities as told by your health care provider. Ask your health care provider what activities are safe for you. ? Avoid activities that make your symptoms worse.  Take brief periods of rest throughout the day. Resting in a lying or standing position is usually better than sitting to rest. ? When you rest for longer periods, mix in some mild activity or stretching between periods of rest. This will help to prevent stiffness and pain. ? Avoid sitting for long periods of time without moving. Get up and move around at least one time each hour.  Exercise and stretch regularly, as told by your health care provider.  Do not lift anything that is heavier than 10 lb (4.5 kg) while you have symptoms of sciatica. When you do not have symptoms, you should still avoid heavy lifting, especially repetitive heavy lifting.  When you lift objects, always use proper lifting technique, which includes: ? Bending your knees. ? Keeping the load close to your body. ? Avoiding twisting. General instructions  Use good posture. ? Avoid leaning forward while sitting. ? Avoid hunching over while standing.  Maintain a healthy weight. Excess weight puts extra stress on your back and makes it difficult to maintain good posture.  Wear supportive, comfortable shoes. Avoid wearing high heels.  Avoid sleeping on a mattress that is too soft or too hard. A mattress that is firm enough to support your back when you sleep may help to reduce your pain.  Keep all follow-up visits as told by your health care provider.  This is important. Contact a health care provider if:  You have pain that wakes you up when you are sleeping.  You have pain that gets worse when you lie down.  Your pain is worse than you have experienced in the past.  Your pain lasts longer than 4 weeks.  You experience unexplained weight loss. Get help right away if:  You lose control of your bowel or bladder (incontinence).  You have: ? Weakness in your lower back, pelvis, buttocks, or legs that gets worse. ? Redness or swelling of your back. ? A burning sensation when you urinate. This information is not intended to replace advice given to you by your health care provider. Make sure you discuss any questions you have with your health care provider. Document Released: 12/19/2000 Document Revised: 05/31/2015 Document Reviewed: 09/03/2014 Elsevier Interactive Patient Education  [employer].

## 2017-09-16 NOTE — Progress Notes (Signed)
Anthony Form. is a 67 y.o. male who presents to [place] today for leg pain.   He has a several month history of pain in his left leg. The pain started in his foot and ankle and has progressed up to his calf. He describes the pain as achy and it does not shoot or radiate. He says that it hurts all of the time but is worse with activity and at night after being on it all day. He hasnot noticed any swelling in his left leg. He has tried Aleve which did not provide much relief and has since switched to diclofenac which has also not provided much relief. He has completed multiple rounds of physical therapy which he says have helped some. He also uses a TENs unit at home which he says helps. He has no history of trauma that he knows of. He has a history of DVT and a hip replacement in his right leg but has not had problems with his left leg in the past.   He has had some work-up already for this issue with a x-ray of the left ankle that was largely unremarkable.  Additionally as he is a heavy smoker there was some concern for claudication as his symptoms were also exertional in nature.  He has had an ABI and had decreased brachial toe index in the left leg but a normal ABI index in the left leg.  He has a follow-up appointment with vascular surgery next week where he has a pre-existing arterial ultrasound and consultation with the vascular surgeon.  He has not had work-up so far for possible lumbar radicular pain.  ROS:  As above  Exam:  BP 131/79   Pulse 62   Wt 211 lb (95.7 kg)   BMI 30.28 kg/m  General: Well Developed, well nourished, and in no acute distress.  Neuro/Psych: Alert and oriented x3, extra-ocular muscles intact, able to move all 4 extremities, sensation grossly intact. Skin: Warm and dry, no rashes noted.  Respiratory: Not using accessory muscles, speaking in full sentences, trachea midline.  Cardiovascular: Pulses palpable, no  extremity edema. Abdomen: Does not appear distended. Left leg/foot: normal appearing with no obvious deformities  Non tender to palpation diffusely  Normal ROM, normal strength  2+ DP and PT pulses  Right leg: 2+ edema to the knee. Normal ROM and strength  L-spine: Nontender to spinal midline.  Normal lumbar motion.  Negative slump test left side.  Lower extremity strength reflexes and sensation are intact.    Lab and Radiology Results  CLINICAL DATA:  Chronic left ankle pain without known injury.  EXAM: LEFT ANKLE COMPLETE - 3+ VIEW  COMPARISON:  None.  FINDINGS: There is no evidence of fracture, dislocation, or joint effusion. There is no evidence of arthropathy or other focal bone abnormality. Soft tissues are unremarkable.  IMPRESSION: Normal left ankle.   Electronically Signed   By: Marijo Conception, M.D.   On: 09/05/2017 16:22 I personally (independently) visualized and performed the interpretation of the images attached in this note.  LOWER EXTREMITY DOPPLER STUDY 07/05/17  Final Interpretation: Right: Resting right ankle-brachial index is within normal range. No evidence of significant right lower extremity arterial disease. The right toe-brachial index is abnormal. RT great toe pressure = 81 mmHg.  Left: Resting left ankle-brachial index is within normal range. No evidence of significant left lower extremity arterial disease. The left toe-brachial index is abnormal. LT Great toe pressure =  74 mmHg.  X-ray L-spine images personally independently reviewed Degenerative changes present throughout with no acute fractures or severe deformity. Aortic arthrosclerosis present Await formal radiology review  Assessment and Plan: 67 y.o. male with left leg pain. He has a normal ankle x ray and an unremarkable exam today. The pain is likely multifactorial with possible vascular and neuropathic contributors. We will get an x ray of the L spine today to assess for L5  nerve root compression. We will also start gabapentin for pain. He has an appointment on 9/18 with vascular to assess the vascular supply to his leg which will be very valuable information. He should continue his physical therapy in the meantime.  Recheck in 3 weeks.  Return sooner if needed.   No orders of the defined types were placed in this encounter.  No orders of the defined types were placed in this encounter.   Historical information moved to improve visibility of documentation.  Past Medical History:  Diagnosis Date  . Anxiety   . Arthritis   . Bipolar disorder ([place])   . CAD (coronary artery disease)    a. INF STEMI 07/04/10:  tx with thrombectomy + [facility] BMS to [facility];  cath 07/04/10: dLM 10-20%, pLAD 40-50%, mLAD 20-30%, pRCA 30%, mRCA occluded and tx with PCI, EF 50% with inf HK. A Multilink  . COPD (chronic obstructive pulmonary disease) ([place])   . Depression   . Glucose intolerance (impaired glucose tolerance)    A1c 6.2 06/2010  . History of DVT (deep vein thrombosis)    traumatic, s/p coumadin tx.  Marland Kitchen HLD (hyperlipidemia)   . Hypertension    pt states is currently on no medications  . Myocardial infarction ([place])    around 2013  . Shortness of breath    walking distance or climbing stairs  . Tobacco abuse   . Urinary frequency    Past Surgical History:  Procedure Laterality Date  . CARDIAC CATHETERIZATION    . COLONOSCOPY N/A 12/29/2015   Procedure: COLONOSCOPY;  Surgeon: Rogene Houston, MD;  [facility];  Service: Endoscopy;  Laterality: N/A;  12:55  . ELECTROCARDIOGRAM     Showed inferolateral ST-elevation and a code STEMI was activated. In the ER, he was treated with morphine, herarin, and 600 mg of Plavix. He was transferred emergently to [facility].   . INGUINAL HERNIA REPAIR Left 07/26/2014   Procedure: OPEN REPAIR OF RECURENT LEFT INGUINAL HERNIA REPAIR WITH MESH;  Surgeon: Greer Pickerel, MD;  [facility]: [facility];  Service: General;   Laterality: Left;  . INSERTION OF MESH Bilateral 10/30/2013   Procedure: INSERTION OF MESH;  Surgeon: Gayland Curry, MD;  [facility]: [facility];  Service: General;  Laterality: Bilateral;  . LAPAROSCOPIC INGUINAL HERNIA WITH UMBILICAL HERNIA Bilateral 10/30/2013   Procedure: laparoscopic repair left pantaloom hernia with mesh, laparoscopic right inguinal hernia with mesh, OPEN REPAIR OF UMBILICAL HERNIA ;  Surgeon: Gayland Curry, MD;  [facility];  Service: General;  Laterality: Bilateral;  . LAPAROSCOPY N/A 07/26/2014   Procedure: LAPAROSCOPY DIAGNOSTIC;  Surgeon: Greer Pickerel, MD;  [facility];  Service: General;  Laterality: N/A;  . POLYPECTOMY  12/29/2015   Procedure: POLYPECTOMY;  Surgeon: Rogene Houston, MD;  [facility];  Service: Endoscopy;;  ascending colon, descending colon  . stent placement    . TOTAL HIP ARTHROPLASTY Right 05/10/2015   Procedure: RIGHT TOTAL HIP ARTHROPLASTY ANTERIOR APPROACH;  Surgeon: Paralee Cancel, MD;  [facility]:  [facility];  Service: Orthopedics;  Laterality: Right;  Marland Kitchen VASECTOMY     Social History   Tobacco Use  . Smoking status: Current Every Day Smoker    Packs/day: 0.50    Years: 45.00    Pack years: 22.50    Types: Cigarettes    Last attempt to quit: 10/26/2016    Years since quitting: 0.8  . Smokeless tobacco: Never Used  Substance Use Topics  . Alcohol use: No    Comment: past hx of ETOH use was in inpt facility per court order  - 20 years, 10-18-2015 per pt not anymore,per pt stopped about 15-20 yrs ago   family history includes Coronary artery disease in his father; Depression in his father and mother; Heart attack in his father.  Medications: Current Outpatient Medications  Medication Sig Dispense Refill  . 5-Hydroxytryptophan (5-HTP PO) Take 1 tablet by mouth at bedtime.    Marland Kitchen albuterol (PROVENTIL HFA;VENTOLIN HFA) 108 (90 Base) MCG/ACT inhaler Inhale 2 puffs into the lungs every 6 (six) hours as needed for wheezing or shortness  of breath. 1 Inhaler 0  . aspirin EC 81 MG tablet Take 1 tablet (81 mg total) by mouth daily. 90 tablet 3  . atorvastatin (LIPITOR) 40 MG tablet Take 1 tablet (40 mg total) by mouth daily. 90 tablet 1  . baclofen (LIORESAL) 10 MG tablet Take 1 tablet (10 mg total) by mouth 2 (two) times daily as needed for muscle spasms. 60 tablet 2  . baclofen (LIORESAL) 10 MG tablet TAKE 1 TABLET BY MOUTH TWICE DAILY 60 tablet 2  . blood glucose meter kit and supplies KIT Dispense based on patient and insurance preference. Use to check BG once daily or every other other.  Dx: E11.9 1 each 0  . buPROPion (WELLBUTRIN SR) 150 MG 12 hr tablet TAKE 1 TABLET BY MOUTH TWICE DAILY TO HELP WITH SMOKING CESSATION 180 tablet 1  . Cyanocobalamin (VITAMIN B-12 PO) Take 1,000 mg by mouth at bedtime.     . diclofenac (VOLTAREN) 75 MG EC tablet Take 1 tablet (75 mg total) by mouth 2 (two) times daily. 60 tablet 2  . fluticasone (FLONASE) 50 MCG/ACT nasal spray Place 2 sprays into both nostrils daily. 16 g 6  . Fluticasone-Umeclidin-Vilant (TRELEGY ELLIPTA) 100-62.5-25 MCG/INH AEPB Inhale 1 puff into the lungs daily. (Patient not taking: Reported on 09/05/2017) 1 each 3  . MELATONIN PO Take by mouth.    . metFORMIN (GLUCOPHAGE-XR) 500 MG 24 hr tablet Take 500 mg by mouth daily with breakfast.    . metoprolol tartrate (LOPRESSOR) 25 MG tablet TAKE ONE-HALF TABLET BY MOUTH TWICE DAILY (Patient taking differently: TAKE ONE-HALF (12.5 MG) TABLET BY MOUTH TWICE DAILY) 90 tablet 1  . sennosides-docusate sodium (SENOKOT-S) 8.6-50 MG tablet Take 1 tablet by mouth 3 (three) times daily as needed for constipation.     . temazepam (RESTORIL) 15 MG capsule TAKE 1 CAPSULE BY MOUTH AT BEDTIME AS NEEDED FOR SLEEP 30 capsule 2  . traMADol (ULTRAM) 50 MG tablet Take 1 tablet (50 mg total) by mouth every 12 (twelve) hours as needed for moderate pain or severe pain. 30 tablet 2  . VALERIAN ROOT PO Take 1 capsule by mouth at bedtime.      No current  facility-administered medications for this visit.    Allergies  Allergen Reactions  . Flomax [Tamsulosin Hcl] Other (See Comments)    This medication makes patient feel sick  . Penicillins Other (See Comments)  Reaction unknown occurred during childhood Has patient had a PCN reaction causing immediate rash, facial/tongue/throat swelling, SOB or lightheadedness with hypotension: unsure - childhood reaction Has patient had a PCN reaction causing severe rash involving mucus membranes or skin necrosis: unsure - childhood reaction Has patient had a PCN reaction that required hospitalization no Has patient had a PCN reaction occurring within the last 10 years: no If all of the above answers are "NO", then may p      Discussed warning signs or symptoms. Please see discharge instructions. Patient expresses understanding.  I personally was present and performed or re-performed the history, physical exam and medical decision-making activities of this service and have verified that the service and findings are accurately documented in the student's note. ___________________________________________ Lynne Leader M.D., ABFM., CAQSM. Primary Care and Sports Medicine Adjunct Instructor of [place] [employer]

## 2017-09-19 ENCOUNTER — Encounter: Payer: Self-pay | Admitting: Physical Therapy

## 2017-09-19 ENCOUNTER — Ambulatory Visit (INDEPENDENT_AMBULATORY_CARE_PROVIDER_SITE_OTHER): Payer: Medicare HMO | Admitting: Physical Therapy

## 2017-09-19 DIAGNOSIS — M6281 Muscle weakness (generalized): Secondary | ICD-10-CM

## 2017-09-19 DIAGNOSIS — M79605 Pain in left leg: Secondary | ICD-10-CM

## 2017-09-19 DIAGNOSIS — R29898 Other symptoms and signs involving the musculoskeletal system: Secondary | ICD-10-CM

## 2017-09-19 NOTE — Therapy (Signed)
[place], [place], [address] Phone: [phone]   Fax:  [phone]  Physical Therapy Treatment  Patient Details  Name: Anthony [name]. MRN: [id number] Date of Birth: Aug 04, 1950 Referring Provider: [name], FN-P   Encounter Date: 09/19/2017  PT End of Session - 09/19/17 1451    Visit Number  11    Number of Visits  12    Date for PT Re-Evaluation  09/23/17    PT Start Time  1446    PT Stop Time  1539    PT Time Calculation (min)  53 min    Activity Tolerance  Patient tolerated treatment well    Behavior During Therapy  [facility] for tasks assessed/performed       Past Medical History:  Diagnosis Date  . Anxiety   . Arthritis   . Bipolar disorder ([place])   . CAD (coronary artery disease)    a. INF STEMI 07/04/10:  tx with thrombectomy + [facility] BMS to [facility];  cath 07/04/10: dLM 10-20%, pLAD 40-50%, mLAD 20-30%, pRCA 30%, mRCA occluded and tx with PCI, EF 50% with inf HK. A Multilink  . COPD (chronic obstructive pulmonary disease) ([place])   . Depression   . Glucose intolerance (impaired glucose tolerance)    A1c 6.2 06/2010  . History of DVT (deep vein thrombosis)    traumatic, s/p coumadin tx.  Marland Kitchen HLD (hyperlipidemia)   . Hypertension    pt states is currently on no medications  . Myocardial infarction ([place])    around 2013  . Shortness of breath    walking distance or climbing stairs  . Tobacco abuse   . Urinary frequency     Past Surgical History:  Procedure Laterality Date  . CARDIAC CATHETERIZATION    . COLONOSCOPY N/A 12/29/2015   Procedure: COLONOSCOPY;  Surgeon: Rogene Houston, MD;  [facility];  Service: Endoscopy;  Laterality: N/A;  12:55  . ELECTROCARDIOGRAM     Showed inferolateral ST-elevation and a code STEMI was activated. In the ER, he was treated with morphine, herarin, and 600 mg of Plavix. He was transferred emergently to [facility].   . INGUINAL  HERNIA REPAIR Left 07/26/2014   Procedure: OPEN REPAIR OF RECURENT LEFT INGUINAL HERNIA REPAIR WITH MESH;  Surgeon: Greer Pickerel, MD;  [facility];  Service: General;  Laterality: Left;  . INSERTION OF MESH Bilateral 10/30/2013   Procedure: INSERTION OF MESH;  Surgeon: Gayland Curry, MD;  [facility];  Service: General;  Laterality: Bilateral;  . LAPAROSCOPIC INGUINAL HERNIA WITH UMBILICAL HERNIA Bilateral 10/30/2013   Procedure: laparoscopic repair left pantaloom hernia with mesh, laparoscopic right inguinal hernia with mesh, OPEN REPAIR OF UMBILICAL HERNIA ;  Surgeon: Gayland Curry, MD;  [facility];  Service: General;  Laterality: Bilateral;  . LAPAROSCOPY N/A 07/26/2014   Procedure: LAPAROSCOPY DIAGNOSTIC;  Surgeon: Greer Pickerel, MD;  [facility];  Service: General;  Laterality: N/A;  . POLYPECTOMY  12/29/2015   Procedure: POLYPECTOMY;  Surgeon: Rogene Houston, MD;  [facility];  Service: Endoscopy;;  ascending colon, descending colon  . stent placement    . TOTAL HIP ARTHROPLASTY Right 05/10/2015   Procedure: RIGHT TOTAL HIP ARTHROPLASTY ANTERIOR APPROACH;  Surgeon: Paralee Cancel, MD;  [facility];  Service: Orthopedics;  Laterality: Right;  Marland Kitchen VASECTOMY      There were no vitals filed for this visit.  Subjective Assessment -  09/19/17 1451    Subjective  "I'm really pleased".   Pt reports he has been doing hamstring and calf stretch.  "I realized if I just rest a little, it does a whole lot of good. " Pt reports he didn't have pain yesterday until 6pm.     Pertinent History  AODM; MI; hernia repair x 2 most recent ~ 1 1/2 yr ago; arthritis; Rt THA 1-2 yrs ago(unsure of date); DVT 2002      Patient Stated Goals  get rid of the pain     Currently in Pain?  Yes    Pain Score  1    0.75/ 10   Pain Location  Leg    Pain Orientation  Left;Lower    Pain Descriptors / Indicators  Aching    Pain Radiating Towards  L knee to ankle    Aggravating Factors   worse  as day goes on     Pain Relieving Factors  ice/heat, TENS         [facility] PT Assessment - 09/19/17 0001      Assessment   Medical Diagnosis  Lt LE pain     Referring Provider  [name], FN-P    Onset Date/Surgical Date  02/08/17   increased symptoms in the past 2 months    Hand Dominance  Right    Next MD Visit  PRN         [facility] Adult PT Treatment/Exercise - 09/19/17 0001      Lumbar Exercises: Stretches   Passive Hamstring Stretch  Right;Left;2 reps   supine with strap, 45 sec   Prone on Elbows Stretch  1 rep;30 seconds    Piriformis Stretch  Right;Left;2 reps   strap assist on foot, 45 seconds     Lumbar Exercises: Aerobic   Stationary Bike  L2: 6 min    PTA present to discuss progress and monitor     Lumbar Exercises: Supine   Ab Set  5 reps;5 seconds    Bridge  10 reps;5 seconds   core engaged.      Lumbar Exercises: Prone   Opposite Arm/Leg Raise  Right arm/Left leg;Left arm/Right leg;5 reps;3 seconds      Knee/Hip Exercises: Stretches   Gastroc Stretch  Left;Right;30 seconds;4 reps   2 reps at wall, 2 reps off stair     Moist Heat Therapy   Number Minutes Moist Heat  15 Minutes    Moist Heat Location  Lumbar Spine      Electrical Stimulation   Electrical Stimulation Location  bilat lumbar musculature     Electrical Stimulation Action  IFC    Electrical Stimulation Parameters  to tolerance    Electrical Stimulation Goals  Pain             PT Education - 09/19/17 1528    Education Details  HEP - encouraged continue compliance of HEP     Person(s) Educated  Patient    Methods  Explanation;Handout;Verbal cues;Demonstration    Comprehension  Verbalized understanding;Returned demonstration          PT Long Term Goals - 09/12/17 1501      PT LONG TERM GOAL #1   Title  Improve LE moblity and ROM with patient to demonstrate increased Lt ankle DF to =/> 10 deg 09/23/17    Time  6    Period  Weeks    Status  On-going      PT LONG TERM GOAL  #2  Title  Decrease Lt LE pain by 50-75% allowing patient to perform normal functional activities with minimal pain or discomfort 09/23/17    Time  6    Period  Weeks    Status  On-going      PT LONG TERM GOAL #3   Title  Independent in HEP 09/23/17    Time  6    Period  Weeks    Status  On-going      PT LONG TERM GOAL #4   Title  Improve FOTO to </= 44% limitation 09/23/17    Baseline  09/04/17: 15% decline in functional limitation    Time  6    Period  Weeks    Status  On-going            Plan - 09/19/17 1459    Clinical Impression Statement  Pt reporting 45% improvement in LE symptoms. Pt reporting improved compliance with a few HEP exercises. Pt reported slight increase in LLE symptoms after completion of opp arm/leg exercise; resolved with position change.  Pt making gradual progress towards goals.     Rehab Potential  Good    PT Frequency  2x / week    PT Duration  6 weeks    PT Treatment/Interventions  Patient/family education;ADLs/Self Care Home Management;Cryotherapy;Electrical Stimulation;Iontophoresis 4mg /ml Dexamethasone;Moist Heat;Ultrasound;Dry needling;Manual techniques;Neuromuscular re-education;Therapeutic activities;Therapeutic exercise    PT Next Visit Plan  continue spinal stabilization.  Assess goals and readiness to d/c.  End of POC.    Consulted and Agree with Plan of Care  Patient       Patient will benefit from skilled therapeutic intervention in order to improve the following deficits and impairments:  Postural dysfunction, Improper body mechanics, Pain, Increased fascial restricitons, Increased muscle spasms, Hypomobility, Decreased mobility, Decreased range of motion, Decreased activity tolerance  Visit Diagnosis: Pain in left leg  Other symptoms and signs involving the musculoskeletal system  Muscle weakness (generalized)     Problem List Patient Active Problem List   Diagnosis Date Noted  . Insomnia 03/26/2017  . Aortic atherosclerosis  ([place]) 10/19/2016  . Pain medication agreement signed 06/05/2016  . Opioid dependence ([place]) 06/05/2016  . Diabetes mellitus without complication ([place]) 45/62/5638  . Obesity (BMI 30-39.9) 04/13/2016  . Rectal bleeding 11/23/2015  . Moderate episode of recurrent major depressive disorder ([place]) 10/18/2015  . S/P right THA, AA 05/10/2015  . Recurrent left inguinal hernia 07/26/2014  . Chronic right hip pain 06/23/2014  . S/P bilateral inguinal hernia repair 10/30/2013  . Nodule of left lung 05/29/2013  . Bilateral inguinal hernia 05/28/2013  . Umbilical hernia 93/73/4287  . Major psychotic depression, recurrent ([place]) 12/16/2012  . Inadequate anticoagulation 04/29/2012  . Hypocalcemia 07/28/2010  . CAD (coronary artery disease)   . Hyperlipidemia associated with type 2 diabetes mellitus ([place])   . COPD (chronic obstructive pulmonary disease) ([place])   . Tobacco abuse    Kerin Perna, [place] 09/19/17 3:31 PM  [place], [place], [address] Phone: [phone]   Fax:  [phone]  Name: Anthony [name]. MRN: [id number] Date of Birth: November 12, 1950

## 2017-09-19 NOTE — Patient Instructions (Signed)
HIP: Hamstrings - Supine  Place strap around foot. Raise leg up, keeping knee straight.  Bend opposite knee to protect back if indicated. Hold 30 seconds. 3 reps per set, 2-3 sets per day  Calf Stretch    Place hands on wall at shoulder height. Keeping back leg straight, bend front leg, feet pointing forward, heels flat on floor. Lean forward slightly until stretch is felt in calf of back leg. Hold stretch ___ seconds, breathing slowly in and out. Repeat stretch with other leg back. Do ___ sessions per day. Variation: Use chair or table for support.  Arm / Leg Lift: Opposite (Prone)    Lift right leg and opposite arm __3__ inches from floor, keeping knee locked. Repeat _5-10___ times per set. Do __1__ sets per session. Do __3__ sessions per week.   Bridging    Slowly raise buttocks from floor, keeping stomach tight. Repeat __10__ times per set. Do __1__ sets per session. Do _3___ sessions per week.   [facility] [place], [place] [address]  [phone] (office) [phone] (fax)

## 2017-09-20 ENCOUNTER — Other Ambulatory Visit: Payer: Self-pay | Admitting: Family

## 2017-09-25 ENCOUNTER — Ambulatory Visit (HOSPITAL_COMMUNITY)
Admission: RE | Admit: 2017-09-25 | Discharge: 2017-09-25 | Disposition: A | Discharge status: Home / Self Care | Payer: Medicare HMO | Source: Ambulatory Visit | Attending: Vascular Surgery | Admitting: Vascular Surgery

## 2017-09-25 ENCOUNTER — Encounter: Payer: Self-pay | Admitting: Vascular Surgery

## 2017-09-25 ENCOUNTER — Ambulatory Visit (INDEPENDENT_AMBULATORY_CARE_PROVIDER_SITE_OTHER)
Admission: RE | Admit: 2017-09-25 | Discharge: 2017-09-25 | Disposition: A | Discharge status: Home / Self Care | Payer: Medicare HMO | Source: Ambulatory Visit | Attending: Vascular Surgery | Admitting: Vascular Surgery

## 2017-09-25 ENCOUNTER — Other Ambulatory Visit: Payer: Self-pay

## 2017-09-25 ENCOUNTER — Ambulatory Visit (INDEPENDENT_AMBULATORY_CARE_PROVIDER_SITE_OTHER): Payer: Medicare HMO | Admitting: Vascular Surgery

## 2017-09-25 VITALS — BP 121/82 | HR 58 | Temp 97.0°F | Resp 16 | Wt 207.0 lb

## 2017-09-25 DIAGNOSIS — I724 Aneurysm of artery of lower extremity: Secondary | ICD-10-CM

## 2017-09-25 DIAGNOSIS — I700 Atherosclerosis of aorta: Secondary | ICD-10-CM | POA: Diagnosis not present

## 2017-09-25 DIAGNOSIS — I2510 Atherosclerotic heart disease of native coronary artery without angina pectoris: Secondary | ICD-10-CM | POA: Diagnosis not present

## 2017-09-25 DIAGNOSIS — E119 Type 2 diabetes mellitus without complications: Secondary | ICD-10-CM | POA: Diagnosis not present

## 2017-09-25 DIAGNOSIS — E785 Hyperlipidemia, unspecified: Secondary | ICD-10-CM | POA: Insufficient documentation

## 2017-09-25 DIAGNOSIS — I739 Peripheral vascular disease, unspecified: Secondary | ICD-10-CM

## 2017-09-25 DIAGNOSIS — F17200 Nicotine dependence, unspecified, uncomplicated: Secondary | ICD-10-CM | POA: Diagnosis not present

## 2017-09-25 NOTE — Progress Notes (Signed)
Patient name: Anthony [name]. MRN: [id number] DOB: [birth year] Sex: male  REASON FOR VISIT:   Follow-up of aneurysm of right superficial femoral artery.  HPI:   Anthony [name]. is a pleasant 67 y.o. male who I saw in consultation on 07/05/2017 with bilateral leg pain.  This involve his calves and knees bilaterally.  At the time of his last visit he was smoking up to 2 packs/day.  At the time of his last visit he had palpable femoral popliteal and pedal pulses bilaterally.  The only pulse I could not feel was a left dorsalis pedis pulse.  I reviewed his previous venous duplex scan and an incidental finding was an aneurysm of his right superficial femoral artery.  I looked with the SonoSite myself and he did appear to have an aneurysm in the proximal right superficial femoral artery.  I felt that this could potentially put him at risk for thrombosis or embolization.  I recommended a formal right lower extremity arterial duplex.  I also recommended screening for an abdominal aortic aneurysm.  We also discussed the importance of tobacco cessation.  Since I saw him last, he continues to have some left leg pain but this occurs simply with standing and I suspect this may be from his chronic venous insufficiency.  He has cut back on the smoking and smoking a pack per day now.  Past Medical History:  Diagnosis Date  . Anxiety   . Arthritis   . Bipolar disorder ([place])   . CAD (coronary artery disease)    a. INF STEMI 07/04/10:  tx with thrombectomy + [facility] BMS to [facility];  cath 07/04/10: dLM 10-20%, pLAD 40-50%, mLAD 20-30%, pRCA 30%, mRCA occluded and tx with PCI, EF 50% with inf HK. A Multilink  . COPD (chronic obstructive pulmonary disease) ([place])   . Depression   . Glucose intolerance (impaired glucose tolerance)    A1c 6.2 06/2010  . History of DVT (deep vein thrombosis)    traumatic, s/p coumadin tx.  Marland Kitchen HLD (hyperlipidemia)   . Hypertension    pt states is currently on no medications  .  Myocardial infarction ([place])    around 2013  . Shortness of breath    walking distance or climbing stairs  . Tobacco abuse   . Urinary frequency     Family History  Problem Relation Age of Onset  . Coronary artery disease Father   . Depression Father   . Heart attack Father   . Depression Mother     SOCIAL HISTORY: Social History   Tobacco Use  . Smoking status: Current Every Day Smoker    Packs/day: 1.00    Years: 45.00    Pack years: 45.00    Types: Cigarettes  . Smokeless tobacco: Never Used  Substance Use Topics  . Alcohol use: No    Comment: past hx of ETOH use was in inpt facility per court order  - 20 years, 10-18-2015 per pt not anymore,per pt stopped about 15-20 yrs ago    Allergies  Allergen Reactions  . Flomax [Tamsulosin Hcl] Other (See Comments)    This medication makes patient feel sick  . Penicillins Other (See Comments)    Reaction unknown occurred during childhood Has patient had a PCN reaction causing immediate rash, facial/tongue/throat swelling, SOB or lightheadedness with hypotension: unsure - childhood reaction Has patient had a PCN reaction causing severe rash involving mucus membranes or skin necrosis: unsure - childhood reaction Has patient had a  PCN reaction that required hospitalization no Has patient had a PCN reaction occurring within the last 10 years: no If all of the above answers are "NO", then may p    Current Outpatient Medications  Medication Sig Dispense Refill  . 5-Hydroxytryptophan (5-HTP PO) Take 1 tablet by mouth at bedtime.    Marland Kitchen albuterol (PROVENTIL HFA;VENTOLIN HFA) 108 (90 Base) MCG/ACT inhaler Inhale 2 puffs into the lungs every 6 (six) hours as needed for wheezing or shortness of breath. 1 Inhaler 0  . aspirin EC 81 MG tablet Take 1 tablet (81 mg total) by mouth daily. 90 tablet 3  . atorvastatin (LIPITOR) 40 MG tablet TAKE 1 TABLET BY MOUTH ONCE DAILY 30 tablet 0  . baclofen (LIORESAL) 10 MG tablet TAKE 1 TABLET BY MOUTH  TWICE DAILY 60 tablet 2  . blood glucose meter kit and supplies KIT Dispense based on patient and insurance preference. Use to check BG once daily or every other other.  Dx: E11.9 1 each 0  . buPROPion (WELLBUTRIN SR) 150 MG 12 hr tablet TAKE 1 TABLET BY MOUTH TWICE DAILY TO HELP WITH SMOKING CESSATION 180 tablet 1  . Cyanocobalamin (VITAMIN B-12 PO) Take 1,000 mg by mouth at bedtime.     . diclofenac (VOLTAREN) 75 MG EC tablet Take 1 tablet (75 mg total) by mouth 2 (two) times daily. 60 tablet 2  . fluticasone (FLONASE) 50 MCG/ACT nasal spray Place 2 sprays into both nostrils daily. 16 g 6  . Fluticasone-Umeclidin-Vilant (TRELEGY ELLIPTA) 100-62.5-25 MCG/INH AEPB Inhale 1 puff into the lungs daily. 1 each 3  . gabapentin (NEURONTIN) 300 MG capsule One tab PO qHS for a week, then BID for a week, then TID. May double weekly to a max of 3,'600mg'$ /day 180 capsule 3  . MELATONIN PO Take by mouth.    . metFORMIN (GLUCOPHAGE-XR) 500 MG 24 hr tablet TAKE 1 TABLET BY MOUTH ONCE DAILY WITH BREAKFAST 30 tablet 0  . metoprolol tartrate (LOPRESSOR) 25 MG tablet TAKE ONE-HALF TABLET BY MOUTH TWICE DAILY (Patient taking differently: TAKE ONE-HALF (12.5 MG) TABLET BY MOUTH TWICE DAILY) 90 tablet 1  . sennosides-docusate sodium (SENOKOT-S) 8.6-50 MG tablet Take 1 tablet by mouth 3 (three) times daily as needed for constipation.     . temazepam (RESTORIL) 15 MG capsule TAKE 1 CAPSULE BY MOUTH AT BEDTIME AS NEEDED FOR SLEEP 30 capsule 2  . traMADol (ULTRAM) 50 MG tablet TAKE 1 TABLET BY MOUTH EVERY 12 HOURS AS NEEDED FOR MODERATE OR SEVERE PAIN 30 tablet 1  . VALERIAN ROOT PO Take 1 capsule by mouth at bedtime.      No current facility-administered medications for this visit.     REVIEW OF SYSTEMS:  '[X]'$  denotes positive finding, '[ ]'$  denotes negative finding Cardiac  Comments:  Chest pain or chest pressure:    Shortness of breath upon exertion:    Short of breath when lying flat:    Irregular heart rhythm:         Vascular    Pain in calf, thigh, or hip brought on by ambulation:    Pain in feet at night that wakes you up from your sleep:     Blood clot in your veins:    Leg swelling:         Pulmonary    Oxygen at home:    Productive cough:     Wheezing:         Neurologic    Sudden weakness in arms  or legs:     Sudden numbness in arms or legs:     Sudden onset of difficulty speaking or slurred speech:    Temporary loss of vision in one eye:     Problems with dizziness:         Gastrointestinal    Blood in stool:     Vomited blood:         Genitourinary    Burning when urinating:     Blood in urine:        Psychiatric    Major depression:         Hematologic    Bleeding problems:    Problems with blood clotting too easily:        Skin    Rashes or ulcers:        Constitutional    Fever or chills:     PHYSICAL EXAM:   Vitals:   09/25/17 0933  BP: 121/82  Pulse: (!) 58  Resp: 16  Temp: (!) 97 F (36.1 C)  TempSrc: Oral  SpO2: 93%  Weight: 207 lb (93.9 kg)    GENERAL: The patient is a well-nourished male, in no acute distress. The vital signs are documented above. CARDIAC: There is a regular rate and rhythm.  VASCULAR: I do not detect carotid bruits. He has palpable femoral popliteal and posterior tibial pulses bilaterally. He has hyperpigmentation and spider veins bilaterally consistent with chronic venous insufficiency. PULMONARY: There is good air exchange bilaterally without wheezing or rales. ABDOMEN: Soft and non-tender with normal pitched bowel sounds.  MUSCULOSKELETAL: There are no major deformities or cyanosis. NEUROLOGIC: No focal weakness or paresthesias are detected. SKIN: There are no ulcers or rashes noted. PSYCHIATRIC: The patient has a normal affect.  DATA:    DUPLEX ABDOMINAL AORTA: I have independently interpreted the duplex of the abdominal aorta.  The maximum diameter of the infrarenal aorta measures 3.4 cm in maximum diameter.  The right  common iliac artery measures 1.9 cm in maximum diameter.  The left common iliac artery measures 1.7 cm in diameter.  DUPLEX RIGHT LOWER EXTREMITY: I have independently interpreted the duplex of the right lower extremity.  The maximum diameter of the proximal superficial femoral artery is 2.48 cm.  There is some laminated thrombus posteriorly that is very smooth.  The mid SFA measures 1.9 cm in maximum diameter of the distal SFA 1.5 cm in maximum diameter.  Thus he has a large arteries in the proximal SFA is slightly aneurysmal but not a big difference compared to the mid SFA.  Of note there is also some slight ectasia of the right popliteal artery which measures up to 1.6 cm in maximum diameter.  MEDICAL ISSUES:   ANEURYSM OF RIGHT SUPERFICIAL FEMORAL ARTERY: The patient does have enlarged arteries generally but some aneurysmal dilatation of the right superficial femoral artery proximally with some laminated thrombus.  However this is not especially different in size compared to the mid SFA and the clot is very smooth.  For this reason I do not think at this point I would recommend surgery.  He does have a very small abdominal aortic aneurysm which will also need to follow.  We have again discussed the importance of tobacco cessation.  I have ordered a CT angiogram in 6 months and I will see him back at that time.  Depending on the results of this study we can determine how best to follow his abdominal aortic aneurysm and his right proximal superficial femoral  artery aneurysm.  I suspect this can be done with duplex.  Certainly if it enlarges or he develops any symptoms or evidence of embolization we could proceed with elective repair.  Deitra Mayo [facility] [phone]

## 2017-09-27 ENCOUNTER — Ambulatory Visit (INDEPENDENT_AMBULATORY_CARE_PROVIDER_SITE_OTHER): Payer: Medicare HMO | Admitting: Physical Therapy

## 2017-09-27 DIAGNOSIS — M79605 Pain in left leg: Secondary | ICD-10-CM | POA: Diagnosis not present

## 2017-09-27 DIAGNOSIS — M6281 Muscle weakness (generalized): Secondary | ICD-10-CM | POA: Diagnosis not present

## 2017-09-27 DIAGNOSIS — R29898 Other symptoms and signs involving the musculoskeletal system: Secondary | ICD-10-CM | POA: Diagnosis not present

## 2017-09-27 NOTE — Therapy (Addendum)
[place] [address] [place], [place], [address] Phone: [phone]   Fax:  [phone]  Physical Therapy Treatment  Patient Details  Name: Anthony [name]. MRN: [id number] Date of Birth: [birth year] Referring Provider: [name], FN-P   Encounter Date: 09/27/2017  PT End of Session - 09/27/17 1409    Visit Number  12    Number of Visits  12    PT Start Time  9030    PT Stop Time  1500    PT Time Calculation (min)  55 min    Activity Tolerance  Patient tolerated treatment well    Behavior During Therapy  [facility] for tasks assessed/performed       Past Medical History:  Diagnosis Date  . Anxiety   . Arthritis   . Bipolar disorder ([place])   . CAD (coronary artery disease)    a. INF STEMI 07/04/10:  tx with thrombectomy + Vision BMS to [facility];  cath 07/04/10: dLM 10-20%, pLAD 40-50%, mLAD 20-30%, pRCA 30%, mRCA occluded and tx with PCI, EF 50% with inf HK. A Multilink  . COPD (chronic obstructive pulmonary disease) ([place])   . Depression   . Glucose intolerance (impaired glucose tolerance)    A1c 6.2 06/2010  . History of DVT (deep vein thrombosis)    traumatic, s/p coumadin tx.  Marland Kitchen HLD (hyperlipidemia)   . Hypertension    pt states is currently on no medications  . Myocardial infarction ([place])    around 2013  . Shortness of breath    walking distance or climbing stairs  . Tobacco abuse   . Urinary frequency     Past Surgical History:  Procedure Laterality Date  . CARDIAC CATHETERIZATION    . COLONOSCOPY N/A 12/29/2015   Procedure: COLONOSCOPY;  Surgeon: Rogene Houston, MD;  [facility];  Service: Endoscopy;  Laterality: N/A;  12:55  . ELECTROCARDIOGRAM     Showed inferolateral ST-elevation and a code STEMI was activated. In the ER, he was treated with morphine, herarin, and 600 mg of Plavix. He was transferred emergently to [facility].   . INGUINAL HERNIA REPAIR Left 07/26/2014   Procedure:  OPEN REPAIR OF RECURENT LEFT INGUINAL HERNIA REPAIR WITH MESH;  Surgeon: Greer Pickerel, MD;  Location: [facility] ORS;  Service: General;  Laterality: Left;  . INSERTION OF MESH Bilateral 10/30/2013   Procedure: INSERTION OF MESH;  Surgeon: Gayland Curry, MD;  Location: [facility];  Service: General;  Laterality: Bilateral;  . LAPAROSCOPIC INGUINAL HERNIA WITH UMBILICAL HERNIA Bilateral 10/30/2013   Procedure: laparoscopic repair left pantaloom hernia with mesh, laparoscopic right inguinal hernia with mesh, OPEN REPAIR OF UMBILICAL HERNIA ;  Surgeon: Gayland Curry, MD;  Location: [facility];  Service: General;  Laterality: Bilateral;  . LAPAROSCOPY N/A 07/26/2014   Procedure: LAPAROSCOPY DIAGNOSTIC;  Surgeon: Greer Pickerel, MD;  Location: [facility];  Service: General;  Laterality: N/A;  . POLYPECTOMY  12/29/2015   Procedure: POLYPECTOMY;  Surgeon: Rogene Houston, MD;  [facility];  Service: Endoscopy;;  ascending colon, descending colon  . stent placement    . TOTAL HIP ARTHROPLASTY Right 05/10/2015   Procedure: RIGHT TOTAL HIP ARTHROPLASTY ANTERIOR APPROACH;  Surgeon: Paralee Cancel, MD;  Location: [facility];  Service: Orthopedics;  Laterality: Right;  Marland Kitchen VASECTOMY      There were no vitals filed for this visit.  Subjective Assessment - 09/27/17 1410    Subjective  Pt reports  that his doctor changed his medicine, adding gabapentin, and his LLE has been much better.  He has noticed less tingling in his Lt foot.     Currently in Pain?  Yes    Pain Score  1     Pain Location  Leg    Pain Orientation  Left    Pain Descriptors / Indicators  Aching    Aggravating Factors   worse as day goes on    Pain Relieving Factors  ice/heat, TENS, medicine         [facility] PT Assessment - 09/27/17 0001      Assessment   Medical Diagnosis  Lt LE pain     Referring Provider  [name], FN-P    Onset Date/Surgical Date  02/08/17   increased symptoms in the past 2 months    Hand Dominance  Right    Next MD Visit  PRN        Observation/Other Assessments   Focus on Therapeutic Outcomes (FOTO)   31% limited      AROM   Right Ankle Dorsiflexion  8    Left Ankle Dorsiflexion  6         [facility] Adult PT Treatment/Exercise - 09/27/17 0001      Exercises   Exercises  Lumbar      Lumbar Exercises: Stretches   Passive Hamstring Stretch  Right;Left;2 reps   seated with straight back.    Standing Extension  5 reps   3 seconds   Piriformis Stretch  Right;Left;3 reps   strap assist on foot, 45 seconds     Lumbar Exercises: Aerobic   Nustep  L5: 6 min    PTA present to discuss progress and monitor     Lumbar Exercises: Supine   Bridge  10 reps;5 seconds   core engaged.      Lumbar Exercises: Sidelying   Clam  Right;Left;10 reps      Lumbar Exercises: Quadruped   Madcat/Old Horse  5 reps    Straight Leg Raise  5 reps   each leg   Opposite Arm/Leg Raise  Right arm/Left leg;Left arm/Right leg   2 reps each side; unbalanced - switched to legs only.      Knee/Hip Exercises: Stretches   Press photographer  Left;Right;30 seconds;4 reps   2 reps at wall     Moist Heat Therapy   Number Minutes Moist Heat  15 Minutes    Moist Heat Location  Lumbar Spine      Electrical Stimulation   Electrical Stimulation Location  bilat lumbar musculature     Electrical Stimulation Action  IFC    Electrical Stimulation Parameters  to tolerance    Electrical Stimulation Goals  Pain                  PT Long Term Goals - 09/27/17 1416      PT LONG TERM GOAL #1   Title  Improve LE moblity and ROM with patient to demonstrate increased Lt ankle DF to =/> 10 deg 09/23/17    Period  Weeks    Status  On-going   improving      PT LONG TERM GOAL #2   Title  Decrease Lt LE pain by 50-75% allowing patient to perform normal functional activities with minimal pain or discomfort 09/23/17    Time  6    Period  Weeks    Status  Achieved      PT LONG TERM GOAL #  3   Title  Independent in HEP 09/23/17    Status   On-going      PT LONG TERM GOAL #4   Title  Improve FOTO to </= 44% limitation 09/23/17    Time  6    Period  Weeks    Status  Achieved            Plan - 09/27/17 1435    Clinical Impression Statement  Pt reporting 80% improvement in LE symptoms. His FOTO score has improved; has met LTG#4.  He tolerated all exercises without increase in LLE symptoms.  DF in Lt ankle has improved.  Pt has partially met his goals and is agreeable to hold therapy for 2 wks while he works on his HEP.     Rehab Potential  Good    PT Frequency  2x / week    PT Duration  6 weeks    PT Treatment/Interventions  Patient/family education;ADLs/Self Care Home Management;Cryotherapy;Electrical Stimulation;Iontophoresis 67m/ml Dexamethasone;Moist Heat;Ultrasound;Dry needling;Manual techniques;Neuromuscular re-education;Therapeutic activities;Therapeutic exercise    PT Next Visit Plan  spoke to supervising PT; will hold therapy for 2 wks.  will d/c in 2 wks if pt doesn't return by 10/11/17.     Consulted and Agree with Plan of Care  Patient       Patient will benefit from skilled therapeutic intervention in order to improve the following deficits and impairments:  Postural dysfunction, Improper body mechanics, Pain, Increased fascial restricitons, Increased muscle spasms, Hypomobility, Decreased mobility, Decreased range of motion, Decreased activity tolerance  Visit Diagnosis: Pain in left leg  Other symptoms and signs involving the musculoskeletal system  Muscle weakness (generalized)     Problem List Patient Active Problem List   Diagnosis Date Noted  . Insomnia 03/26/2017  . Aortic atherosclerosis ([place] 10/19/2016  . Pain medication agreement signed 06/05/2016  . Opioid dependence ([place] 06/05/2016  . Diabetes mellitus without complication ([place] 020/35/5974 . Obesity (BMI 30-39.9) 04/13/2016  . Rectal bleeding 11/23/2015  . Moderate episode of recurrent major depressive disorder ([place] 10/18/2015  .  S/P right THA, AA 05/10/2015  . Recurrent left inguinal hernia 07/26/2014  . Chronic right hip pain 06/23/2014  . S/P bilateral inguinal hernia repair 10/30/2013  . Nodule of left lung 05/29/2013  . Bilateral inguinal hernia 05/28/2013  . Umbilical hernia 016/38/4536 . Major psychotic depression, recurrent ([place] 12/16/2012  . Inadequate anticoagulation 04/29/2012  . Hypocalcemia 07/28/2010  . CAD (coronary artery disease)   . Hyperlipidemia associated with type 2 diabetes mellitus ([place]   . COPD (chronic obstructive pulmonary disease) ([place]   . Tobacco abuse    JKerin Perna PDelaware09/20/19 4:26 PM  [address] [place] [address]Phone: [phone]  Fax:  [phone] Name: DCoden Franchi MRN: [id number] of Birth: [birth year] PHYSICAL THERAPY DISCHARGE SUMMARY  Visits from Start of Care: 12  Current functional level related to goals / functional outcomes: See last progress note for discharge status    Remaining deficits: Unknown    Education / Equipment: HEP  Plan: Patient agrees to discharge.  Patient goals were not met. Patient is being discharged due to being pleased with the current functional level.  ?????    Celyn P. HHelene KelpPT, MPH 10/23/17 11:04 AM

## 2017-10-07 ENCOUNTER — Ambulatory Visit (INDEPENDENT_AMBULATORY_CARE_PROVIDER_SITE_OTHER): Payer: Medicare HMO | Admitting: Family Medicine

## 2017-10-07 ENCOUNTER — Encounter: Payer: Self-pay | Admitting: Family Medicine

## 2017-10-07 VITALS — BP 115/68 | HR 61 | Ht 68.0 in | Wt 209.0 lb

## 2017-10-07 DIAGNOSIS — M79605 Pain in left leg: Secondary | ICD-10-CM

## 2017-10-07 DIAGNOSIS — M5416 Radiculopathy, lumbar region: Secondary | ICD-10-CM

## 2017-10-07 NOTE — Patient Instructions (Addendum)
Thank you for coming in today. Continue gabapentin 3x daily.  Recheck in 3 months.  Return sooner if needed. \ If this get dramatically worse we want to see you same day.   Schedule a new patient check with Dr Sheppard Coil.

## 2017-10-07 NOTE — Progress Notes (Signed)
Anthony Form. is a 67 y.o. male who presents to [facility] today for follow-up left leg pain.  Patient was seen September 9 for left leg pain.  The pain at the time was thought to be either claudication or radiculopathy.  He has evidence of lumbar degenerative disease as well as decreased ABI left foot.  He was referred to physical therapy and had a trial of gabapentin.  Additionally he followed up with vascular surgery.  Fortunately he had a reassuringly normal more detailed vascular study of his left leg.  Additionally he had significant symptom improvement with physical therapy but much more significant symptom improvement with gabapentin.  He notes he is feeling much better and is quite happy with how things are going.   ROS as above:  Exam:  BP 115/68   Pulse 61   Ht 5' 8"  (1.727 m)   Wt 209 lb (94.8 kg)   BMI 31.78 kg/m  Wt Readings from Last 5 Encounters:  10/07/17 209 lb (94.8 kg)  09/25/17 207 lb (93.9 kg)  09/16/17 211 lb (95.7 kg)  09/05/17 210 lb 12.8 oz (95.6 kg)  08/01/17 211 lb (95.7 kg)    Gen: Well NAD HEENT: EOMI,  MMM Lungs: Normal work of breathing. CTABL Heart: RRR no MRG Abd: NABS, Soft. Nondistended, Nontender Exts: Brisk capillary refill, warm and well perfused.  Left leg: Normal-appearing normal gait.  Lab and Radiology Results  EXAM: LUMBAR SPINE - COMPLETE 4+ VIEW  COMPARISON:  04/16/2014 CT.  FINDINGS: Normal alignment lumbar spine. No compression fracture or pars defect. Mild L4-5 and L5-S1 disc space narrowing. Facet degenerative changes L3-4 through L5-S1.  Prominent anterior osteophyte T11-12. Small anterior osteophyte L1-2 and L2-3. Prior right hip replacement. Vascular calcifications. Caliber of aorta incompletely assessed but may be dilated. Ultrasound can be obtained for further  delineation.  IMPRESSION: 1. Mild L4-5 and L5-S1 disc space narrowing. 2. Facet degenerative changes L3-4 through L5-S1. 3. Aortic Atherosclerosis (ICD10-I70.0). Caliber of aorta incompletely assessed but may be dilated. Abdominal aortic ultrasound recommended for further delineation.   Electronically Signed   By: Genia Del M.D.   On: 09/16/2017 19:54 I personally (independently) visualized and performed the interpretation of the images attached in this note.  Vascular US left lower extremity Final Interpretation: Right: Abnormal dilitation of the RIGHT SFA  AAA Duplex Final Interpretation: Abdominal Aorta: There is evidence of abnormal dilitation of the Distal Abdominal aorta. The largest aortic measurement is 3.4 cm. Ectatic supracelic Aorta. Ectatic bilateral common iliac arteries. No previous exam available for comparison. Stenosis  Assessment and Plan: 67 y.o. male with  Left leg pain likely neurologic however patient does have some evidence for vascular compromise.  Plan for continued gabapentin and home physical therapy type exercises.  Recheck in 3 months.  Return sooner if needed.  Establish care with Dr. Sheppard Coil is new local PCP and follow-up basic other health issues including diabetes.   No orders of the defined types were placed in this encounter.  No orders of the defined types were placed in this encounter.    Historical information moved to improve visibility of documentation.  Past Medical History:  Diagnosis Date  . Anxiety   . Arthritis   . Bipolar disorder ([place])   . CAD (coronary artery disease)    a. INF STEMI 07/04/10:  tx with thrombectomy + [facility] BMS to mRCA;  cath 07/04/10: dLM 10-20%, pLAD 40-50%,  mLAD 20-30%, pRCA 30%, mRCA occluded and tx with PCI, EF 50% with inf HK. A Multilink  . COPD (chronic obstructive pulmonary disease) ([place])   . Depression   . Glucose intolerance (impaired glucose tolerance)    A1c 6.2 06/2010  . History of  DVT (deep vein thrombosis)    traumatic, s/p coumadin tx.  Marland Kitchen HLD (hyperlipidemia)   . Hypertension    pt states is currently on no medications  . Myocardial infarction ([place])    around 2013  . Shortness of breath    walking distance or climbing stairs  . Tobacco abuse   . Urinary frequency    Past Surgical History:  Procedure Laterality Date  . CARDIAC CATHETERIZATION    . COLONOSCOPY N/A 12/29/2015   Procedure: COLONOSCOPY;  Surgeon: Rogene Houston, MD;  [facility];  Service: Endoscopy;  Laterality: N/A;  12:55  . ELECTROCARDIOGRAM     Showed inferolateral ST-elevation and a code STEMI was activated. In the ER, he was treated with morphine, herarin, and 600 mg of Plavix. He was transferred emergently to [facility].   . INGUINAL HERNIA REPAIR Left 07/26/2014   Procedure: OPEN REPAIR OF RECURENT LEFT INGUINAL HERNIA REPAIR WITH MESH;  Surgeon: Greer Pickerel, MD;  Location: [facility];  Service: General;  Laterality: Left;  . INSERTION OF MESH Bilateral 10/30/2013   Procedure: INSERTION OF MESH;  Surgeon: Gayland Curry, MD;  Location: [facility];  Service: General;  Laterality: Bilateral;  . LAPAROSCOPIC INGUINAL HERNIA WITH UMBILICAL HERNIA Bilateral 10/30/2013   Procedure: laparoscopic repair left pantaloom hernia with mesh, laparoscopic right inguinal hernia with mesh, OPEN REPAIR OF UMBILICAL HERNIA ;  Surgeon: Gayland Curry, MD;  Location: [facility];  Service: General;  Laterality: Bilateral;  . LAPAROSCOPY N/A 07/26/2014   Procedure: LAPAROSCOPY DIAGNOSTIC;  Surgeon: Greer Pickerel, MD;  Location: [facility];  Service: General;  Laterality: N/A;  . POLYPECTOMY  12/29/2015   Procedure: POLYPECTOMY;  Surgeon: Rogene Houston, MD;  [facility];  Service: Endoscopy;;  ascending colon, descending colon  . stent placement    . TOTAL HIP ARTHROPLASTY Right 05/10/2015   Procedure: RIGHT TOTAL HIP ARTHROPLASTY ANTERIOR APPROACH;  Surgeon: Paralee Cancel, MD;  Location: [facility];   Service: Orthopedics;  Laterality: Right;  Marland Kitchen VASECTOMY     Social History   Tobacco Use  . Smoking status: Current Every Day Smoker    Packs/day: 1.00    Years: 45.00    Pack years: 45.00    Types: Cigarettes  . Smokeless tobacco: Never Used  Substance Use Topics  . Alcohol use: No    Comment: past hx of ETOH use was in inpt facility per court order  - 20 years, 10-18-2015 per pt not anymore,per pt stopped about 15-20 yrs ago   family history includes Coronary artery disease in his father; Depression in his father and mother; Heart attack in his father.  Medications: Current Outpatient Medications  Medication Sig Dispense Refill  . 5-Hydroxytryptophan (5-HTP PO) Take 1 tablet by mouth at bedtime.    Marland Kitchen albuterol (PROVENTIL HFA;VENTOLIN HFA) 108 (90 Base) MCG/ACT inhaler Inhale 2 puffs into the lungs every 6 (six) hours as needed for wheezing or shortness of breath. 1 Inhaler 0  . aspirin EC 81 MG tablet Take 1 tablet (81 mg total) by mouth daily. 90 tablet 3  . atorvastatin (LIPITOR) 40 MG tablet TAKE 1 TABLET BY MOUTH ONCE DAILY 30 tablet 0  .  baclofen (LIORESAL) 10 MG tablet TAKE 1 TABLET BY MOUTH TWICE DAILY 60 tablet 2  . blood glucose meter kit and supplies KIT Dispense based on patient and insurance preference. Use to check BG once daily or every other other.  Dx: E11.9 1 each 0  . buPROPion (WELLBUTRIN SR) 150 MG 12 hr tablet TAKE 1 TABLET BY MOUTH TWICE DAILY TO HELP WITH SMOKING CESSATION 180 tablet 1  . Cyanocobalamin (VITAMIN B-12 PO) Take 1,000 mg by mouth at bedtime.     . diclofenac (VOLTAREN) 75 MG EC tablet Take 1 tablet (75 mg total) by mouth 2 (two) times daily. 60 tablet 2  . fluticasone (FLONASE) 50 MCG/ACT nasal spray Place 2 sprays into both nostrils daily. 16 g 6  . Fluticasone-Umeclidin-Vilant (TRELEGY ELLIPTA) 100-62.5-25 MCG/INH AEPB Inhale 1 puff into the lungs daily. 1 each 3  . gabapentin (NEURONTIN) 300 MG capsule One tab PO qHS for a week, then BID for a  week, then TID. May double weekly to a max of 3,657m/day 180 capsule 3  . MELATONIN PO Take by mouth.    . metFORMIN (GLUCOPHAGE-XR) 500 MG 24 hr tablet TAKE 1 TABLET BY MOUTH ONCE DAILY WITH BREAKFAST 30 tablet 0  . metoprolol tartrate (LOPRESSOR) 25 MG tablet TAKE ONE-HALF TABLET BY MOUTH TWICE DAILY (Patient taking differently: TAKE ONE-HALF (12.5 MG) TABLET BY MOUTH TWICE DAILY) 90 tablet 1  . sennosides-docusate sodium (SENOKOT-S) 8.6-50 MG tablet Take 1 tablet by mouth 3 (three) times daily as needed for constipation.     . temazepam (RESTORIL) 15 MG capsule TAKE 1 CAPSULE BY MOUTH AT BEDTIME AS NEEDED FOR SLEEP 30 capsule 2  . traMADol (ULTRAM) 50 MG tablet TAKE 1 TABLET BY MOUTH EVERY 12 HOURS AS NEEDED FOR MODERATE OR SEVERE PAIN 30 tablet 1  . VALERIAN ROOT PO Take 1 capsule by mouth at bedtime.      No current facility-administered medications for this visit.    Allergies  Allergen Reactions  . Flomax [Tamsulosin Hcl] Other (See Comments)    This medication makes patient feel sick  . Penicillins Other (See Comments)    Reaction unknown occurred during childhood Has patient had a PCN reaction causing immediate rash, facial/tongue/throat swelling, SOB or lightheadedness with hypotension: unsure - childhood reaction Has patient had a PCN reaction causing severe rash involving mucus membranes or skin necrosis: unsure - childhood reaction Has patient had a PCN reaction that required hospitalization no Has patient had a PCN reaction occurring within the last 10 years: no If all of the above answers are "NO", then may p     Discussed warning signs or symptoms. Please see discharge instructions. Patient expresses understanding.

## 2017-10-16 ENCOUNTER — Ambulatory Visit (INDEPENDENT_AMBULATORY_CARE_PROVIDER_SITE_OTHER): Payer: Medicare HMO | Admitting: Osteopathic Medicine

## 2017-10-16 ENCOUNTER — Encounter: Payer: Self-pay | Admitting: Osteopathic Medicine

## 2017-10-16 VITALS — BP 135/73 | HR 70 | Temp 98.0°F | Wt 210.3 lb

## 2017-10-16 DIAGNOSIS — E1165 Type 2 diabetes mellitus with hyperglycemia: Secondary | ICD-10-CM | POA: Diagnosis not present

## 2017-10-16 DIAGNOSIS — Z720 Tobacco use: Secondary | ICD-10-CM

## 2017-10-16 DIAGNOSIS — Z23 Encounter for immunization: Secondary | ICD-10-CM | POA: Diagnosis not present

## 2017-10-16 DIAGNOSIS — I2510 Atherosclerotic heart disease of native coronary artery without angina pectoris: Secondary | ICD-10-CM

## 2017-10-16 DIAGNOSIS — E785 Hyperlipidemia, unspecified: Secondary | ICD-10-CM

## 2017-10-16 DIAGNOSIS — E1169 Type 2 diabetes mellitus with other specified complication: Secondary | ICD-10-CM | POA: Diagnosis not present

## 2017-10-16 DIAGNOSIS — J439 Emphysema, unspecified: Secondary | ICD-10-CM

## 2017-10-16 LAB — POCT GLYCOSYLATED HEMOGLOBIN (HGB A1C): Hemoglobin A1C: 7.6 % — AB (ref 4.0–5.6)

## 2017-10-16 MED ORDER — METOPROLOL TARTRATE 25 MG PO TABS: 12.5000 mg | ORAL_TABLET | Freq: Two times a day (BID) | ORAL | 1 refills | Status: DC

## 2017-10-16 MED ORDER — METFORMIN HCL ER 500 MG PO TB24: 1000.0000 mg | ORAL_TABLET | Freq: Every day | ORAL | 3 refills | Status: DC

## 2017-10-18 NOTE — Progress Notes (Signed)
HPI: Anthony [name]. is a 67 y.o. male who  has a past medical history of Anxiety, Arthritis, Bipolar disorder ([place]), CAD (coronary artery disease), COPD (chronic obstructive pulmonary disease) ([place]), Depression, Glucose intolerance (impaired glucose tolerance), History of DVT (deep vein thrombosis), HLD (hyperlipidemia), Hypertension, Myocardial infarction ([place]), Shortness of breath, Tobacco abuse, and Urinary frequency.  he presents to [facility] today, 10/18/17,  for chief complaint of:  Medication refills Diabetes recheck  A1c as below, 7.6.  No episodes of hypoglycemia.  HTN/coronary artery disease: No chest pain, pressure, shortness of breath, dizziness, headache.  Patient reports doing well on current medications and needs refill of metoprolol.  COPD: Stable, has not needed rescue inhaler for some time    Past medical history, surgical history, and family history reviewed.  Current medication list and allergy/intolerance information reviewed.   (See remainder of HPI, ROS, Phys Exam below)  No results found.  Results for orders placed or performed in visit on 10/16/17 (from the past 72 hour(s))  POCT HgB A1C     Status: Abnormal   Collection Time: 10/16/17  2:09 PM  Result Value Ref Range   Hemoglobin A1C 7.6 (A) 4.0 - 5.6 %   HbA1c POC (<> result, manual entry)     HbA1c, POC (prediabetic range)     HbA1c, POC (controlled diabetic range)       ASSESSMENT/PLAN: Given no hypoglycemia and not maximized on metformin, could certainly consider increasing the dose of this but patient states he would like to try a stricter diet/exercise regimen before adjusting medicines and I think this is reasonable  Type 2 diabetes mellitus with hyperglycemia, without long-term current use of insulin ([place]) - Plan: POCT HgB A1C, metFORMIN (GLUCOPHAGE-XR) 500 MG 24 hr tablet  Need for influenza vaccination - Plan: Flu vaccine HIGH DOSE PF (Fluzone High  dose)  Coronary artery disease involving native coronary artery of native heart without angina pectoris - Plan: metoprolol tartrate (LOPRESSOR) 25 MG tablet  Pulmonary emphysema, unspecified emphysema type ([place])  Hyperlipidemia associated with type 2 diabetes mellitus ([place])  Tobacco abuse   Meds ordered this encounter  Medications  . metoprolol tartrate (LOPRESSOR) 25 MG tablet    Sig: Take 0.5 tablets (12.5 mg total) by mouth 2 (two) times daily.    Dispense:  90 tablet    Refill:  1    Please consider 90 day supplies to promote better adherence  . metFORMIN (GLUCOPHAGE-XR) 500 MG 24 hr tablet    Sig: Take 2 tablets (1,000 mg total) by mouth daily with breakfast.    Dispense:  180 tablet    Refill:  3    30 days only needs to be seen    Follow-up plan: Return in about 3 months (around 01/16/2018) for recheck A1C - sooner if needed.                 ############################################ ############################################ ############################################ ############################################    Outpatient Encounter Medications as of 10/16/2017  Medication Sig Note  . 5-Hydroxytryptophan (5-HTP PO) Take 1 tablet by mouth at bedtime. 09/05/2017: PRN  . aspirin EC 81 MG tablet Take 1 tablet (81 mg total) by mouth daily.   Marland Kitchen atorvastatin (LIPITOR) 40 MG tablet TAKE 1 TABLET BY MOUTH ONCE DAILY   . baclofen (LIORESAL) 10 MG tablet TAKE 1 TABLET BY MOUTH TWICE DAILY   . blood glucose meter kit and supplies KIT Dispense based on patient and insurance preference. Use to check BG  once daily or every other other.  Dx: E11.9   . buPROPion (WELLBUTRIN SR) 150 MG 12 hr tablet TAKE 1 TABLET BY MOUTH TWICE DAILY TO HELP WITH SMOKING CESSATION   . Cyanocobalamin (VITAMIN B-12 PO) Take 1,000 mg by mouth at bedtime.  10/16/2017: As per pt, taking 2000 mg daily  . diclofenac (VOLTAREN) 75 MG EC tablet Take 1 tablet (75 mg total) by mouth 2 (two)  times daily.   . fluticasone (FLONASE) 50 MCG/ACT nasal spray Place 2 sprays into both nostrils daily.   . Fluticasone-Umeclidin-Vilant (TRELEGY ELLIPTA) 100-62.5-25 MCG/INH AEPB Inhale 1 puff into the lungs daily.   Marland Kitchen gabapentin (NEURONTIN) 300 MG capsule One tab PO qHS for a week, then BID for a week, then TID. May double weekly to a max of 3,627m/day   . MELATONIN PO Take by mouth. 10/16/2017: prn  . metFORMIN (GLUCOPHAGE-XR) 500 MG 24 hr tablet Take 2 tablets (1,000 mg total) by mouth daily with breakfast.   . metoprolol tartrate (LOPRESSOR) 25 MG tablet Take 0.5 tablets (12.5 mg total) by mouth 2 (two) times daily.   . sennosides-docusate sodium (SENOKOT-S) 8.6-50 MG tablet Take 1 tablet by mouth 3 (three) times daily as needed for constipation.    . temazepam (RESTORIL) 15 MG capsule TAKE 1 CAPSULE BY MOUTH AT BEDTIME AS NEEDED FOR SLEEP   . traMADol (ULTRAM) 50 MG tablet TAKE 1 TABLET BY MOUTH EVERY 12 HOURS AS NEEDED FOR MODERATE OR SEVERE PAIN   . VALERIAN ROOT PO Take 1 capsule by mouth at bedtime.  09/05/2017: PRN  . [DISCONTINUED] metFORMIN (GLUCOPHAGE-XR) 500 MG 24 hr tablet TAKE 1 TABLET BY MOUTH ONCE DAILY WITH BREAKFAST   . [DISCONTINUED] metoprolol tartrate (LOPRESSOR) 25 MG tablet TAKE ONE-HALF TABLET BY MOUTH TWICE DAILY (Patient taking differently: TAKE ONE-HALF (12.5 MG) TABLET BY MOUTH TWICE DAILY)   . albuterol (PROVENTIL HFA;VENTOLIN HFA) 108 (90 Base) MCG/ACT inhaler Inhale 2 puffs into the lungs every 6 (six) hours as needed for wheezing or shortness of breath. (Patient not taking: Reported on 10/16/2017)    No facility-administered encounter medications on file as of 10/16/2017.    Allergies  Allergen Reactions  . Flomax [Tamsulosin Hcl] Other (See Comments)    This medication makes patient feel sick  . Penicillins Other (See Comments)    Reaction unknown occurred during childhood Has patient had a PCN reaction causing immediate rash, facial/tongue/throat swelling, SOB  or lightheadedness with hypotension: unsure - childhood reaction Has patient had a PCN reaction causing severe rash involving mucus membranes or skin necrosis: unsure - childhood reaction Has patient had a PCN reaction that required hospitalization no Has patient had a PCN reaction occurring within the last 10 years: no If all of the above answers are "NO", then may p      Review of Systems:  Constitutional: No recent illness  HEENT: No  headache, no vision change  Cardiac: No  chest pain, No  pressure, No palpitations  Respiratory:  No  shortness of breath. No  Cough  Gastrointestinal: No  abdominal pain, no change on bowel habits  Musculoskeletal: No new myalgia/arthralgia  Skin: No  Rash  Hem/Onc: No  easy bruising/bleeding, No  abnormal lumps/bumps  Neurologic: No  weakness, No  Dizziness  Psychiatric: No  concerns with depression, No  concerns with anxiety  Exam:  BP 135/73 (BP Location: Left Arm, Patient Position: Sitting, Cuff Size: Normal)   Pulse 70   Temp 98 F (36.7 C) (Oral)  Wt 210 lb 4.8 oz (95.4 kg)   BMI 31.98 kg/m   Constitutional: VS see above. General Appearance: alert, well-developed, well-nourished, NAD  Eyes: Normal lids and conjunctive, non-icteric sclera  Ears, Nose, Mouth, Throat: MMM, Normal external inspection ears/nares/mouth/lips/gums.  Neck: No masses, trachea midline.   Respiratory: Normal respiratory effort. no wheeze, no rhonchi, no rales  Cardiovascular: S1/S2 normal, no murmur, no rub/gallop auscultated. RRR.   Musculoskeletal: Gait normal. Symmetric and independent movement of all extremities  Neurological: Normal balance/coordination. No tremor.  Skin: warm, dry, intact.   Psychiatric: Normal judgment/insight. Normal mood and affect. Oriented x3.   Visit summary with medication list and pertinent instructions was printed for patient to review, advised to alert [place] if any changes needed. All questions at time of visit were  answered - patient instructed to contact office with any additional concerns. ER/RTC precautions were reviewed with the patient and understanding verbalized.   Follow-up plan: Return in about 3 months (around 01/16/2018) for recheck A1C - sooner if needed.     Please note: voice recognition software was used to produce this document, and typos may escape review. Please contact Dr. Sheppard Coil for any needed clarifications.

## 2017-11-23 ENCOUNTER — Other Ambulatory Visit: Payer: Self-pay | Admitting: Family

## 2017-11-23 DIAGNOSIS — M159 Polyosteoarthritis, unspecified: Secondary | ICD-10-CM

## 2017-11-23 DIAGNOSIS — M79605 Pain in left leg: Secondary | ICD-10-CM

## 2017-12-11 ENCOUNTER — Ambulatory Visit (INDEPENDENT_AMBULATORY_CARE_PROVIDER_SITE_OTHER): Payer: Medicare HMO | Admitting: Osteopathic Medicine

## 2017-12-11 ENCOUNTER — Encounter: Payer: Self-pay | Admitting: Osteopathic Medicine

## 2017-12-11 ENCOUNTER — Ambulatory Visit (INDEPENDENT_AMBULATORY_CARE_PROVIDER_SITE_OTHER): Payer: Medicare HMO

## 2017-12-11 VITALS — BP 122/65 | HR 76 | Temp 97.6°F | Wt 207.6 lb

## 2017-12-11 DIAGNOSIS — J441 Chronic obstructive pulmonary disease with (acute) exacerbation: Secondary | ICD-10-CM | POA: Diagnosis not present

## 2017-12-11 DIAGNOSIS — J449 Chronic obstructive pulmonary disease, unspecified: Secondary | ICD-10-CM | POA: Diagnosis not present

## 2017-12-11 DIAGNOSIS — R05 Cough: Secondary | ICD-10-CM | POA: Diagnosis not present

## 2017-12-11 DIAGNOSIS — J439 Emphysema, unspecified: Secondary | ICD-10-CM | POA: Diagnosis not present

## 2017-12-11 MED ORDER — AZITHROMYCIN 250 MG PO TABS: ORAL_TABLET | ORAL | 0 refills | Status: DC

## 2017-12-11 MED ORDER — BENZONATATE 200 MG PO CAPS: 200.0000 mg | ORAL_CAPSULE | Freq: Three times a day (TID) | ORAL | 0 refills | Status: DC | PRN

## 2017-12-11 MED ORDER — PREDNISONE 20 MG PO TABS: 20.0000 mg | ORAL_TABLET | Freq: Two times a day (BID) | ORAL | 0 refills | Status: DC

## 2017-12-11 MED ORDER — BUDESONIDE-FORMOTEROL FUMARATE 80-4.5 MCG/ACT IN AERO: 2.0000 | INHALATION_SPRAY | Freq: Two times a day (BID) | RESPIRATORY_TRACT | 3 refills | Status: DC

## 2017-12-11 MED ORDER — IPRATROPIUM-ALBUTEROL 0.5-2.5 (3) MG/3ML IN SOLN
3.0000 mL | Freq: Once | RESPIRATORY_TRACT | Status: AC
  Administered 2017-12-11: 3 mL via RESPIRATORY_TRACT

## 2017-12-11 NOTE — Patient Instructions (Addendum)
Bronchitis - higher risk because of smoking and diabetes I sent medications to pharmacy to help treat symptoms and to help infection. If the radiologist sees any concerning pneumonia on the Xray, we may change antibiotics. See list of medicines below.     Meds ordered this encounter  Medications  . budesonide-formoterol (SYMBICORT) 80-4.5 MCG/ACT inhaler    Sig: Inhale 2 puffs into the lungs 2 (two) times daily.    Dispense:  1 Inhaler    Refill:  3  . predniSONE (DELTASONE) 20 MG tablet - steroids to calm inflammation    Sig: Take 1 tablet (20 mg total) by mouth 2 (two) times daily with a meal.    Dispense:  10 tablet    Refill:  0  . azithromycin (ZITHROMAX) 250 MG tablet - antibiotics, probably not needed but I'm concerned for risk of pneumonia given your COPD and diabetes and smoking    Sig: 2 tabs po on Day 1, then 1 tab daily Days 2 - 5    Dispense:  6 tablet    Refill:  0  . benzonatate (TESSALON) 200 MG capsule - cough medicine     Sig: Take 1 capsule (200 mg total) by mouth 3 (three) times daily as needed for cough.    Dispense:  30 capsule    Refill:  0

## 2017-12-11 NOTE — Progress Notes (Signed)
HPI: Anthony [name]. is a 67 y.o. male who  has a past medical history of Anxiety, Arthritis, Bipolar disorder ([place]), CAD (coronary artery disease), COPD (chronic obstructive pulmonary disease) ([place]), Depression, Glucose intolerance (impaired glucose tolerance), History of DVT (deep vein thrombosis), HLD (hyperlipidemia), Hypertension, Myocardial infarction ([place]), Shortness of breath, Tobacco abuse, and Urinary frequency.  he presents to [facility] today, 12/11/17,  for chief complaint of: Sick - respiratory   . Location/Quality: cough, congestion. Cough occasionally productive.  . Context: he is a smoker, hx COPD and DM2. Son made him come in today . Duration: 10 days . Modifying factors: has been taking unknown OTC meds from [employer] for cold/sinus. Has not used inhaler.  . Assoc signs/symptoms: no fever.   Immunization History  Administered Date(s) Administered  . Influenza, High Dose Seasonal PF 10/16/2017  . Tdap 03/07/2012, 12/07/2016   Social History   Tobacco Use  Smoking Status Current Every Day Smoker  . Packs/day: 1.00  . Years: 45.00  . Pack years: 45.00  . Types: Cigarettes  Smokeless Tobacco Never Used       Past medical, surgical, social and family history reviewed and updated as necessary.   Current medication list and allergy/intolerance information reviewed:    Current Outpatient Medications  Medication Sig Dispense Refill  . 5-Hydroxytryptophan (5-HTP PO) Take 1 tablet by mouth at bedtime.    Marland Kitchen albuterol (PROVENTIL HFA;VENTOLIN HFA) 108 (90 Base) MCG/ACT inhaler Inhale 2 puffs into the lungs every 6 (six) hours as needed for wheezing or shortness of breath. (Patient not taking: Reported on 10/16/2017) 1 Inhaler 0  . aspirin EC 81 MG tablet Take 1 tablet (81 mg total) by mouth daily. 90 tablet 3  . atorvastatin (LIPITOR) 40 MG tablet TAKE 1 TABLET BY MOUTH ONCE DAILY 30 tablet 0  . baclofen (LIORESAL) 10 MG tablet  TAKE 1 TABLET BY MOUTH TWICE DAILY 60 tablet 2  . blood glucose meter kit and supplies KIT Dispense based on patient and insurance preference. Use to check BG once daily or every other other.  Dx: E11.9 1 each 0  . buPROPion (WELLBUTRIN SR) 150 MG 12 hr tablet TAKE 1 TABLET BY MOUTH TWICE DAILY TO HELP WITH SMOKING CESSATION 180 tablet 1  . Cyanocobalamin (VITAMIN B-12 PO) Take 1,000 mg by mouth at bedtime.     . diclofenac (VOLTAREN) 75 MG EC tablet TAKE 1 TABLET BY MOUTH TWICE DAILY 60 tablet 2  . fluticasone (FLONASE) 50 MCG/ACT nasal spray Place 2 sprays into both nostrils daily. 16 g 6  . Fluticasone-Umeclidin-Vilant (TRELEGY ELLIPTA) 100-62.5-25 MCG/INH AEPB Inhale 1 puff into the lungs daily. 1 each 3  . gabapentin (NEURONTIN) 300 MG capsule One tab PO qHS for a week, then BID for a week, then TID. May double weekly to a max of 3,620m/day 180 capsule 3  . MELATONIN PO Take by mouth.    . metFORMIN (GLUCOPHAGE-XR) 500 MG 24 hr tablet Take 2 tablets (1,000 mg total) by mouth daily with breakfast. 180 tablet 3  . metoprolol tartrate (LOPRESSOR) 25 MG tablet Take 0.5 tablets (12.5 mg total) by mouth 2 (two) times daily. 90 tablet 1  . sennosides-docusate sodium (SENOKOT-S) 8.6-50 MG tablet Take 1 tablet by mouth 3 (three) times daily as needed for constipation.     . temazepam (RESTORIL) 15 MG capsule TAKE 1 CAPSULE BY MOUTH AT BEDTIME AS NEEDED FOR SLEEP 30 capsule 2  . traMADol (ULTRAM) 50 MG  tablet TAKE 1 TABLET BY MOUTH EVERY 12 HOURS AS NEEDED FOR MODERATE OR SEVERE PAIN 30 tablet 1  . VALERIAN ROOT PO Take 1 capsule by mouth at bedtime.      No current facility-administered medications for this visit.     Allergies  Allergen Reactions  . Flomax [Tamsulosin Hcl] Other (See Comments)    This medication makes patient feel sick  . Penicillins Other (See Comments)    Reaction unknown occurred during childhood Has patient had a PCN reaction causing immediate rash, facial/tongue/throat  swelling, SOB or lightheadedness with hypotension: unsure - childhood reaction Has patient had a PCN reaction causing severe rash involving mucus membranes or skin necrosis: unsure - childhood reaction Has patient had a PCN reaction that required hospitalization no Has patient had a PCN reaction occurring within the last 10 years: no If all of the above answers are "NO", then may p      Review of Systems:  Constitutional:  No  fever, no chills, +recent illness, No unintentional weight changes. +significant fatigue.   HEENT: No  headache, no vision change, no hearing change, No sore throat, No  sinus pressure  Cardiac: No  chest pain, No  pressure, No palpitations  Respiratory:  +shortness of breath. +Cough  Gastrointestinal: No  abdominal pain, No  nausea, No  vomiting,  No  blood in stool, No  diarrhea  Musculoskeletal: No new myalgia/arthralgia  Skin: No  Rash  Neurologic: No  weakness, No  dizziness  Exam:  BP 122/65   Pulse 76   Temp 97.6 F (36.4 C) (Oral)   Wt 207 lb 9.6 oz (94.2 kg)   SpO2 92%   BMI 31.57 kg/m   Constitutional: VS see above. General Appearance: alert, well-developed, well-nourished, NAD  Eyes: Normal lids and conjunctive, non-icteric sclera  Ears, Nose, Mouth, Throat: MMM, Normal external inspection ears/nares/mouth/lips/gums. TM normal bilaterally. Pharynx/tonsils no erythema, no exudate. Nasal mucosa normal.   Neck: No masses, trachea midline. No tenderness/mass appreciated. No lymphadenopathy  Respiratory: Normal respiratory effort. Diffuse wheeze/coarse breath sounds all lung fields   Cardiovascular: S1/S2 normal, no murmur, no rub/gallop auscultated. RRR. No lower extremity edema.   Gastrointestinal: Nontender, no masses. Bowel sounds normal.  Musculoskeletal: Gait normal.   Neurological: Normal balance/coordination. No tremor.   Skin: warm, dry, intact.   Psychiatric: Normal judgment/insight. Normal mood and affect.  CXR  personally reviewed - I don't see concerning PNA, await radiology over-read          ASSESSMENT/PLAN: The primary encounter diagnosis was COPD with acute exacerbation ([place]). A diagnosis of Pulmonary emphysema, unspecified emphysema type ([place]) was also pertinent to this visit.   Patient Instructions   Bronchitis - higher risk because of smoking and diabetes I sent medications to pharmacy to help treat symptoms and to help infection. If the radiologist sees any concerning pneumonia on the Xray, we may change antibiotics. See list of medicines below.     Meds ordered this encounter  Medications  . budesonide-formoterol (SYMBICORT) 80-4.5 MCG/ACT inhaler    Sig: Inhale 2 puffs into the lungs 2 (two) times daily.    Dispense:  1 Inhaler    Refill:  3  . predniSONE (DELTASONE) 20 MG tablet - steroids to calm inflammation    Sig: Take 1 tablet (20 mg total) by mouth 2 (two) times daily with a meal.    Dispense:  10 tablet    Refill:  0  . azithromycin (ZITHROMAX) 250 MG tablet - antibiotics, probably  not needed but I'm concerned for risk of pneumonia given your COPD and diabetes and smoking    Sig: 2 tabs po on Day 1, then 1 tab daily Days 2 - 5    Dispense:  6 tablet    Refill:  0  . benzonatate (TESSALON) 200 MG capsule - cough medicine     Sig: Take 1 capsule (200 mg total) by mouth 3 (three) times daily as needed for cough.    Dispense:  30 capsule    Refill:  0         Visit summary with medication list and pertinent instructions was printed for patient to review. All questions at time of visit were answered - patient instructed to contact office with any additional concerns or updates. ER/RTC precautions were reviewed with the patient.   Follow-up plan: Return if symptoms worsen or fail to improve.    Please note: voice recognition software was used to produce this document, and typos may escape review. Please contact Dr. Sheppard Coil for any needed clarifications.

## 2017-12-17 ENCOUNTER — Telehealth: Payer: Self-pay

## 2017-12-17 NOTE — Telephone Encounter (Signed)
Left VM with recommendation  

## 2017-12-17 NOTE — Telephone Encounter (Signed)
If antibiotics didn't help, likely there was not a bacterial issue and this was mostly viral or copd, if he is feeling the same or worse, he should come see [place] for repeat chest Xray. I can refill cough medicines if needed - cough will commonly persist after initial illness is improved.

## 2017-12-17 NOTE — Telephone Encounter (Signed)
As per pt, finished the course of antibiotics. However still feeling under the weather. Pt is wondering if provider can send in a different antibiotic to local pharmacy. Pls advise, thanks.

## 2018-01-02 ENCOUNTER — Encounter: Payer: Self-pay | Admitting: Emergency Medicine

## 2018-01-02 ENCOUNTER — Emergency Department (INDEPENDENT_AMBULATORY_CARE_PROVIDER_SITE_OTHER)
Admission: EM | Admit: 2018-01-02 | Discharge: 2018-01-02 | Disposition: A | Discharge status: Home / Self Care | Payer: Medicare HMO | Source: Home / Self Care | Attending: Family Medicine | Admitting: Family Medicine

## 2018-01-02 ENCOUNTER — Other Ambulatory Visit: Payer: Self-pay

## 2018-01-02 ENCOUNTER — Emergency Department (INDEPENDENT_AMBULATORY_CARE_PROVIDER_SITE_OTHER): Payer: Medicare HMO

## 2018-01-02 DIAGNOSIS — J9811 Atelectasis: Secondary | ICD-10-CM

## 2018-01-02 DIAGNOSIS — R05 Cough: Secondary | ICD-10-CM | POA: Diagnosis not present

## 2018-01-02 DIAGNOSIS — J181 Lobar pneumonia, unspecified organism: Secondary | ICD-10-CM

## 2018-01-02 DIAGNOSIS — J189 Pneumonia, unspecified organism: Secondary | ICD-10-CM

## 2018-01-02 MED ORDER — IPRATROPIUM-ALBUTEROL 0.5-2.5 (3) MG/3ML IN SOLN
3.0000 mL | Freq: Once | RESPIRATORY_TRACT | Status: AC
  Administered 2018-01-02: 3 mL via RESPIRATORY_TRACT

## 2018-01-02 MED ORDER — GUAIFENESIN-CODEINE 100-10 MG/5ML PO SOLN: ORAL | 0 refills | Status: DC

## 2018-01-02 MED ORDER — METHYLPREDNISOLONE ACETATE 80 MG/ML IJ SUSP: 80.0000 mg | Freq: Once | INTRAMUSCULAR | Status: DC

## 2018-01-02 MED ORDER — LEVOFLOXACIN 750 MG PO TABS: 750.0000 mg | ORAL_TABLET | Freq: Every day | ORAL | 0 refills | Status: AC

## 2018-01-02 NOTE — ED Triage Notes (Signed)
Patient has had prolonged cough that has not cleared with antibiotics; reports fatigue and weakness.

## 2018-01-02 NOTE — ED Provider Notes (Signed)
[name]    CSN: [id number] Arrival date & time: 01/02/18  1827     History   Chief Complaint Chief Complaint  Patient presents with  . Cough    HPI Anthony [name]. is a 67 y.o. male.   Patient was evaluated 12 days ago for COPD exacerbation, and treated for bronchitis with Z-pak, prednisone burst, Tessalon perles, and Symbicort inhaler.  He states that he has an albuterol inhaler which has not been very helpful.  He admits that he has been using generic equivalents of Daquil, Nyquil, and Dramamine. He complains of a persistent cough, increasing fatigue, wheezing, shortness of breath with activity, and chills.  He denies pleuritic pain. He continues to smoke. He states that he has an appointment with his PCP in 4 days.  The history is provided by the patient.    Past Medical History:  Diagnosis Date  . Anxiety   . Arthritis   . Bipolar disorder ([place])   . CAD (coronary artery disease)    a. INF STEMI 07/04/10:  tx with thrombectomy + [facility] to [facility];  cath 07/04/10: dLM 10-20%, pLAD 40-50%, mLAD 20-30%, pRCA 30%, mRCA occluded and tx with PCI, EF 50% with inf HK. A Multilink  . COPD (chronic obstructive pulmonary disease) ([place])   . Depression   . Glucose intolerance (impaired glucose tolerance)    A1c 6.2 06/2010  . History of DVT (deep vein thrombosis)    traumatic, s/p coumadin tx.  Marland Kitchen HLD (hyperlipidemia)   . Hypertension    pt states is currently on no medications  . Myocardial infarction ([place])    around 2013  . Shortness of breath    walking distance or climbing stairs  . Tobacco abuse   . Urinary frequency     Patient Active Problem List   Diagnosis Date Noted  . Insomnia 03/26/2017  . Aortic atherosclerosis ([place]) 10/19/2016  . Pain medication agreement signed 06/05/2016  . Opioid dependence ([place]) 06/05/2016  . Diabetes mellitus without complication ([place]) 13/24/4010  . Obesity (BMI 30-39.9) 04/13/2016  . Rectal bleeding 11/23/2015  .  Moderate episode of recurrent major depressive disorder ([place]) 10/18/2015  . S/P right THA, AA 05/10/2015  . Recurrent left inguinal hernia 07/26/2014  . Chronic right hip pain 06/23/2014  . S/P bilateral inguinal hernia repair 10/30/2013  . Nodule of left lung 05/29/2013  . Bilateral inguinal hernia 05/28/2013  . Umbilical hernia 27/25/3664  . Major psychotic depression, recurrent ([place]) 12/16/2012  . Inadequate anticoagulation 04/29/2012  . Hypocalcemia 07/28/2010  . CAD (coronary artery disease)   . Hyperlipidemia associated with type 2 diabetes mellitus ([place])   . COPD (chronic obstructive pulmonary disease) ([place])   . Tobacco abuse     Past Surgical History:  Procedure Laterality Date  . CARDIAC CATHETERIZATION    . COLONOSCOPY N/A 12/29/2015   Procedure: COLONOSCOPY;  Surgeon: Rogene Houston, MD;  [facility];  Service: Endoscopy;  Laterality: N/A;  12:55  . ELECTROCARDIOGRAM     Showed inferolateral ST-elevation and a code STEMI was activated. In the ER, he was treated with morphine, herarin, and 600 mg of Plavix. He was transferred emergently to [facility].   . INGUINAL HERNIA REPAIR Left 07/26/2014   Procedure: OPEN REPAIR OF RECURENT LEFT INGUINAL HERNIA REPAIR WITH MESH;  Surgeon: Greer Pickerel, MD;  Location: [facility] ORS;  Service: General;  Laterality: Left;  . INSERTION OF MESH Bilateral 10/30/2013   Procedure: INSERTION OF  MESH;  Surgeon: Gayland Curry, MD;  Location: [facility] OR[facility];  Service: General;  Laterality: Bilateral;  . LAPAROSCOPIC INGUINAL HERNIA WITH UMBILICAL HERNIA Bilateral 10/30/2013   Procedure: laparoscopic repair left pantaloom hernia with mesh, laparoscopic right inguinal hernia with mesh, OPEN REPAIR OF UMBILICAL HERNIA ;  Surgeon: Gayland Curry, MD;  Location: [facility] OR[facility];  Service: General;  Laterality: Bilateral;  . LAPAROSCOPY N/A 07/26/2014   Procedure: LAPAROSCOPY DIAGNOSTIC;  Surgeon: Greer Pickerel, MD;  Location: [facility];  Service: General;   Laterality: N/A;  . POLYPECTOMY  12/29/2015   Procedure: POLYPECTOMY;  Surgeon: Rogene Houston, MD;  [facility]: [facility];  Service: Endoscopy;;  ascending colon, descending colon  . stent placement    . TOTAL HIP ARTHROPLASTY Right 05/10/2015   Procedure: RIGHT TOTAL HIP ARTHROPLASTY ANTERIOR APPROACH;  Surgeon: Paralee Cancel, MD;  Location: [facility];  Service: Orthopedics;  Laterality: Right;  Marland Kitchen VASECTOMY         Home Medications    Prior to Admission medications   Medication Sig Start Date End Date Taking? Authorizing Provider  5-Hydroxytryptophan (5-HTP PO) Take 1 tablet by mouth at bedtime.    [provider]  albuterol (PROVENTIL HFA;VENTOLIN HFA) 108 (90 Base) MCG/ACT inhaler Inhale 2 puffs into the lungs every 6 (six) hours as needed for wheezing or shortness of breath. 01/18/17   Dettinger, Fransisca Kaufmann, MD  aspirin EC 81 MG tablet Take 1 tablet (81 mg total) by mouth daily. 02/25/17   Burnell Blanks, MD  atorvastatin (LIPITOR) 40 MG tablet TAKE 1 TABLET BY MOUTH ONCE DAILY 09/23/17   [name] A, FNP  baclofen (LIORESAL) 10 MG tablet TAKE 1 TABLET BY MOUTH TWICE DAILY 09/12/17   [name] A, FNP  benzonatate (TESSALON) 200 MG capsule Take 1 capsule (200 mg total) by mouth 3 (three) times daily as needed for cough. 12/11/17   Emeterio Reeve, DO  blood glucose meter kit and supplies KIT Dispense based on patient and insurance preference. Use to check BG once daily or every other other.  Dx: E11.9 07/17/16   Sharion Balloon, FNP  budesonide-formoterol (SYMBICORT) 80-4.5 MCG/ACT inhaler Inhale 2 puffs into the lungs 2 (two) times daily. 12/11/17   Emeterio Reeve, DO  buPROPion [facility] SR) 150 MG 12 hr tablet TAKE 1 TABLET BY MOUTH TWICE DAILY TO HELP WITH SMOKING CESSATION 05/13/17   Hawks, Christy A, FNP  Cyanocobalamin (VITAMIN B-12 PO) Take 1,000 mg by mouth at bedtime.     [provider]  diclofenac (VOLTAREN) 75 MG EC tablet TAKE 1 TABLET BY  MOUTH TWICE DAILY 11/26/17   [name] A, FNP  fluticasone (FLONASE) 50 MCG/ACT nasal spray Place 2 sprays into both nostrils daily. 09/25/16   [name] A, FNP  Fluticasone-Umeclidin-Vilant (TRELEGY ELLIPTA) 100-62.5-25 MCG/INH AEPB Inhale 1 puff into the lungs daily. 06/04/17   Sharion Balloon, FNP  gabapentin (NEURONTIN) 300 MG capsule One tab PO qHS for a week, then BID for a week, then TID. May double weekly to a max of 3,684m/day 09/16/17   CGregor Hams MD  guaiFENesin-codeine 100-10 MG/5ML syrup Take 150mby mouth at bedtime as needed for cough.  May repeat dose in 4 to 6 hours. 01/02/18   BeKandra NicolasMD  levofloxacin (LEVAQUIN) 750 MG tablet Take 1 tablet (750 mg total) by mouth daily for 7 days. 01/02/18 01/09/18  BeKandra NicolasMD  MELATONIN PO Take by mouth.    [provider]  metFORMIN (GLUCOPHAGE-XR) 500 MG 24 hr tablet Take 2 tablets (1,000 mg total) by mouth daily with breakfast. 10/16/17   Emeterio Reeve, DO  metoprolol tartrate (LOPRESSOR) 25 MG tablet Take 0.5 tablets (12.5 mg total) by mouth 2 (two) times daily. 10/16/17   Emeterio Reeve, DO  sennosides-docusate sodium (SENOKOT-S) 8.6-50 MG tablet Take 1 tablet by mouth 3 (three) times daily as needed for constipation.     [provider]  temazepam (RESTORIL) 15 MG capsule TAKE 1 CAPSULE BY MOUTH AT BEDTIME AS NEEDED FOR SLEEP 09/12/17   Hawks, Alyse Low A, FNP  traMADol (ULTRAM) 50 MG tablet TAKE 1 TABLET BY MOUTH EVERY 12 HOURS AS NEEDED FOR MODERATE OR SEVERE PAIN 09/17/17   [name] A, FNP  VALERIAN ROOT PO Take 1 capsule by mouth at bedtime.     [provider]    Family History Family History  Problem Relation Age of Onset  . Coronary artery disease Father   . Depression Father   . Heart attack Father   . Depression Mother     Social History Social History   Tobacco Use  . Smoking status: Current Every Day Smoker    Packs/day: 1.00    Years: 45.00    Pack  years: 45.00    Types: Cigarettes  . Smokeless tobacco: Never Used  Substance Use Topics  . Alcohol use: No    Comment: past hx of ETOH use was in inpt facility per court order  - 20 years, 10-18-2015 per pt not anymore,per pt stopped about 15-20 yrs ago  . Drug use: No    Types: Marijuana, Heroin, Cocaine    Comment: snorted heroin and cocaine, 10-18-2015 per pt in the past he did Marijuana only and nothing else     Allergies   Flomax [tamsulosin hcl] and Penicillins   Review of Systems Review of Systems No sore throat + cough No pleuritic pain + wheezing + nasal congestion + post-nasal drainage No sinus pain/pressure No itchy/red eyes No earache No hemoptysis + SOB No fever, ? chills No nausea No vomiting No abdominal pain No diarrhea No urinary symptoms No skin rash + fatigue No myalgias No headache Used OTC meds without relief   Physical Exam Triage Vital Signs ED Triage Vitals  Enc Vitals Group     BP 01/02/18 1919 121/75     Pulse --      Resp 01/02/18 1919 18     Temp 01/02/18 1919 98.1 F (36.7 C)     Temp Source 01/02/18 1919 Oral     SpO2 01/02/18 1919 91 %     Weight --      Height --      Head Circumference --      Peak Flow --      Pain Score 01/02/18 1920 0     Pain Loc --      Pain Edu? --      Excl. in [place]? --    No data found.  Updated Vital Signs BP 121/75 (BP Location: Right Arm)   Temp 98.1 F (36.7 C) (Oral)   Resp 18   SpO2 91%   Visual Acuity Right Eye Distance:   Left Eye Distance:   Bilateral Distance:    Right Eye Near:   Left Eye Near:    Bilateral Near:     Physical Exam Nursing notes and Vital Signs reviewed. Appearance:  Patient appears stated age, and in no acute distress Eyes:  Pupils are  equal, round, and reactive to light and accomodation.  Extraocular movement is intact.  Conjunctivae are not inflamed  Ears:  Canals normal.  Tympanic membranes normal.  Nose:  Congested turbinates.  No sinus  tenderness.   Pharynx:  Normal Neck:  Supple.  No adenopathy Lungs:   Diffuse rhonchi and wheezes all fields.  Breath sounds are equal.  Moving air well. Heart:  Regular rate and rhythm without murmurs, rubs, or gallops.  Rate 73 Abdomen:  Nontender without masses or hepatosplenomegaly.  Bowel sounds are present.  No CVA or flank tenderness.  Extremities:  Trace edema.  Skin:  No rash present.    UC Treatments / Results  Labs (all labs ordered are listed, but only abnormal results are displayed) Labs Reviewed - No data to display  EKG None  Radiology Dg Chest 2 View  Result Date: 01/02/2018 CLINICAL DATA:  Productive cough for 2.5 weeks. EXAM: CHEST - 2 VIEW COMPARISON:  12/11/2017 FINDINGS: Stable heart size and mediastinal contours with tortuous atherosclerotic aorta. Bibasilar atelectasis is noted with slightly more confluent retrocardiac opacity noted since prior. A superimposed pneumonia is not excluded. No effusion or edema. No acute osseous abnormality. IMPRESSION: Bibasilar atelectasis with slightly more confluent retrocardiac opacity. A superimposed pneumonia at the left lung base is not excluded. Electronically Signed   By: Ashley Royalty M.D.   On: 01/02/2018 19:39    Procedures Procedures (including critical care time)  Medications Ordered in UC Medications  methylPREDNISolone acetate (DEPO-MEDROL) injection 80 mg (has no administration in time range)  ipratropium-albuterol (DUONEB) 0.5-2.5 (3) MG/3ML nebulizer solution 3 mL (3 mLs Nebulization Given 01/02/18 2033)    Initial Impression / Assessment and Plan / UC Course  I have reviewed the triage vital signs and the nursing notes.  Pertinent labs & imaging results that were available during my care of the patient were reviewed by me and considered in my medical decision making (see chart for details).    Administered DuoNeb by hand held nebulizer. Administered Depo Medrol 13m IM  Begin Levaquin. Rx for Robitussin  AC for night time cough.  Controlled Substance Prescriptions I have consulted the [place] Controlled Substances Registry for this patient, and feel the risk/benefit ratio today is favorable for proceeding with this prescription for a controlled substance.   Followup with Family Doctor in 4 days as scheduled.   Final Clinical Impressions(s) / UC Diagnoses   Final diagnoses:  Pneumonia of left lower lobe due to infectious organism ([facility]     Discharge Instructions     Take plain guaifenesin (12064mextended release tabs such as Mucinex) twice daily, with plenty of water, for cough and congestion. Get adequate rest.   May use Afrin nasal spray (or generic oxymetazoline) each morning for about 5 days and then discontinue.  Also recommend using saline nasal spray several times daily and saline nasal irrigation (AYR is a common brand).    Stop all antihistamines for now (Nyquil, Dramamine, etc), and other non-prescription cough/cold preparations. Continue Symbicort inhaler.  If symptoms become significantly worse during the night or over the weekend, proceed to the local emergency room.     ED Prescriptions    Medication Sig Dispense Auth. Provider   levofloxacin (LEVAQUIN) 750 MG tablet Take 1 tablet (750 mg total) by mouth daily for 7 days. 7 tablet BeKandra NicolasMD   guaiFENesin-codeine 100-10 MG/5ML syrup Take 101my mouth at bedtime as needed for cough.  May repeat dose in 4 to 6 hours.  100 mL Kandra Nicolas, MD        Kandra Nicolas, MD 01/04/18 1600

## 2018-01-02 NOTE — Discharge Instructions (Addendum)
Take plain guaifenesin (1200mg  extended release tabs such as Mucinex) twice daily, with plenty of water, for cough and congestion. Get adequate rest.   May use Afrin nasal spray (or generic oxymetazoline) each morning for about 5 days and then discontinue.  Also recommend using saline nasal spray several times daily and saline nasal irrigation (AYR is a common brand).    Stop all antihistamines for now (Nyquil, Dramamine, etc), and other non-prescription cough/cold preparations. Continue Symbicort inhaler.  If symptoms become significantly worse during the night or over the weekend, proceed to the local emergency room.

## 2018-01-03 ENCOUNTER — Other Ambulatory Visit: Payer: Self-pay | Admitting: Pharmacist

## 2018-01-03 NOTE — Patient Outreach (Signed)
[facility]) Care Management  01/03/2018  Anthony [name]. 17-Jul-1950 [id number]   Patient was called regarding medication adherence to atorvastatin. HIPAA identifiers were obtained. Patient stated that he had plenty of atorvastatin and did not need a refill.  He said was very frustrated with [facility] calling him about his medication refills.  When asked how he was taking he atorvastatin, he said he was taking it 1 tablet daily as prescribed.  Patient also shared he has signed up for [facility] next year.   Elayne Guerin, PharmD, [place] Clinical Pharmacist [phone]

## 2018-01-06 ENCOUNTER — Ambulatory Visit (INDEPENDENT_AMBULATORY_CARE_PROVIDER_SITE_OTHER): Payer: Medicare HMO | Admitting: Family Medicine

## 2018-01-06 ENCOUNTER — Encounter: Payer: Self-pay | Admitting: Family Medicine

## 2018-01-06 VITALS — BP 118/66 | HR 70 | Ht 69.0 in | Wt 203.0 lb

## 2018-01-06 DIAGNOSIS — J189 Pneumonia, unspecified organism: Secondary | ICD-10-CM

## 2018-01-06 DIAGNOSIS — J439 Emphysema, unspecified: Secondary | ICD-10-CM | POA: Diagnosis not present

## 2018-01-06 DIAGNOSIS — J410 Simple chronic bronchitis: Secondary | ICD-10-CM | POA: Diagnosis not present

## 2018-01-06 DIAGNOSIS — J441 Chronic obstructive pulmonary disease with (acute) exacerbation: Secondary | ICD-10-CM

## 2018-01-06 MED ORDER — PREDNISONE 50 MG PO TABS: 50.0000 mg | ORAL_TABLET | Freq: Every day | ORAL | 0 refills | Status: DC

## 2018-01-06 MED ORDER — ALBUTEROL SULFATE HFA 108 (90 BASE) MCG/ACT IN AERS: 2.0000 | INHALATION_SPRAY | RESPIRATORY_TRACT | 1 refills | Status: DC | PRN

## 2018-01-06 MED ORDER — HYDROCODONE-HOMATROPINE 5-1.5 MG/5ML PO SYRP: 5.0000 mL | ORAL_SOLUTION | Freq: Three times a day (TID) | ORAL | 0 refills | Status: DC | PRN

## 2018-01-06 MED ORDER — BENZONATATE 200 MG PO CAPS: 200.0000 mg | ORAL_CAPSULE | Freq: Three times a day (TID) | ORAL | 3 refills | Status: DC | PRN

## 2018-01-06 NOTE — Progress Notes (Signed)
Anthony [name]. is a 67 y.o. male who presents to [facility] [place] today for cough congestion shortness of breath wheezing and follow-up pneumonia.  Patient was seen in urgent care on December 26 diagnosed with atelectasis question pneumonia.  He had been seen previously by his primary care provider in early December for what is thought to be a COPD exacerbation.  He was given steroids and inhalers and cough suppressors.  In the urgent care after x-ray she was concerning for pneumonia he was prescribed Levaquin.  He was also prescribed codeine cough syrup which has not been helpful.  He notes the [employer] were a little helpful.  He is feeling a lot better systemically from pneumonia standpoint.  He still has a cough and wheezing but notes less fevers and chills.  He notes cough is quite obnoxious and does interfere with sleep.  He does note some wheezing as well.  Patient was originally scheduled to follow-up leg pain.  He notes considerable improvement with gabapentin and baclofen.  He notes he is effectively asymptomatic. ROS as above:  Exam:  BP 118/66   Pulse 70   Ht 5' 9"  (1.753 m)   Wt 203 lb (92.1 kg)   SpO2 93%   BMI 29.98 kg/m  Wt Readings from Last 5 Encounters:  01/06/18 203 lb (92.1 kg)  12/11/17 207 lb 9.6 oz (94.2 kg)  10/16/17 210 lb 4.8 oz (95.4 kg)  10/07/17 209 lb (94.8 kg)  09/25/17 207 lb (93.9 kg)    Gen: Well NAD HEENT: EOMI,  MMM clear nasal discharge. Lungs: Normal work of breathing.  Wheezing and prolonged breath sounds present bilaterally frequent coughing. Heart: RRR no MRG Abd: NABS, Soft. Nondistended, Nontender Exts: Brisk capillary refill, warm and well perfused.   Lab and Radiology Results No results found for this or any previous visit (from the past 72 hour(s)). Dg Chest 2 View  Result Date: 01/02/2018 CLINICAL DATA:   Productive cough for 2.5 weeks. EXAM: CHEST - 2 VIEW COMPARISON:  12/11/2017 FINDINGS: Stable heart size and mediastinal contours with tortuous atherosclerotic aorta. Bibasilar atelectasis is noted with slightly more confluent retrocardiac opacity noted since prior. A superimposed pneumonia is not excluded. No effusion or edema. No acute osseous abnormality. IMPRESSION: Bibasilar atelectasis with slightly more confluent retrocardiac opacity. A superimposed pneumonia at the left lung base is not excluded. Electronically Signed   By: Ashley Royalty M.D.   On: 01/02/2018 19:39   I personally (independently) visualized and performed the interpretation of the images attached in this note.    Assessment and Plan: 67 y.o. male with improving pneumonia in the setting of COPD exacerbation versus bronchitis.  Plan to complete Levaquin course.  Will treat presumed COPD exacerbation with steroids prednisone 50 mg daily for 5 days.  Will control cough with Tessalon Perles and hydrocodone-based cough syrup.  Albuterol for wheezing as well.  Leg pain continue current regimen.  Refill as needed.  Recheck as needed. No orders of the defined types were placed in this encounter.  Meds ordered this encounter  Medications  . benzonatate (TESSALON) 200 MG capsule    Sig: Take 1 capsule (200 mg total) by mouth 3 (three) times daily as needed for cough.    Dispense:  30 capsule    Refill:  3  . albuterol (PROVENTIL HFA;VENTOLIN HFA) 108 (90 Base) MCG/ACT inhaler    Sig: Inhale 2 puffs into the lungs every  4 (four) hours as needed for wheezing or shortness of breath.    Dispense:  1 Inhaler    Refill:  1  . HYDROcodone-homatropine (HYCODAN) 5-1.5 MG/5ML syrup    Sig: Take 5 mLs by mouth every 8 (eight) hours as needed for cough.    Dispense:  120 mL    Refill:  0  . predniSONE (DELTASONE) 50 MG tablet    Sig: Take 1 tablet (50 mg total) by mouth daily.    Dispense:  5 tablet    Refill:  0     Historical  information moved to improve visibility of documentation.  Past Medical History:  Diagnosis Date  . Anxiety   . Arthritis   . Bipolar disorder ([place])   . CAD (coronary artery disease)    a. INF STEMI 07/04/10:  tx with thrombectomy + [facility] to [facility];  cath 07/04/10: dLM 10-20%, pLAD 40-50%, mLAD 20-30%, pRCA 30%, mRCA occluded and tx with PCI, EF 50% with inf HK. A [facility]ink  . COPD (chronic obstructive pulmonary disease) ([place])   . Depression   . Glucose intolerance (impaired glucose tolerance)    A1c 6.2 06/2010  . History of DVT (deep vein thrombosis)    traumatic, s/p coumadin tx.  Marland Kitchen HLD (hyperlipidemia)   . Hypertension    pt states is currently on no medications  . Myocardial infarction ([place])    around 2013  . Shortness of breath    walking distance or climbing stairs  . Tobacco abuse   . Urinary frequency    Past Surgical History:  Procedure Laterality Date  . CARDIAC CATHETERIZATION    . COLONOSCOPY N/A 12/29/2015   Procedure: COLONOSCOPY;  Surgeon: Rogene Houston, MD;  Location: [facility];  Service: Endoscopy;  Laterality: N/A;  12:55  . ELECTROCARDIOGRAM     Showed inferolateral ST-elevation and a code STEMI was activated. In the ER, he was treated with morphine, herarin, and 600 mg of Plavix. He was transferred emergently to [facility].   . INGUINAL HERNIA REPAIR Left 07/26/2014   Procedure: OPEN REPAIR OF RECURENT LEFT INGUINAL HERNIA REPAIR WITH MESH;  Surgeon: Greer Pickerel, MD;  Location: [facility] ORS;  Service: General;  Laterality: Left;  . INSERTION OF MESH Bilateral 10/30/2013   Procedure: INSERTION OF MESH;  Surgeon: Gayland Curry, MD;  Location: [facility] ORS;  Service: General;  Laterality: Bilateral;  . LAPAROSCOPIC INGUINAL HERNIA WITH UMBILICAL HERNIA Bilateral 10/30/2013   Procedure: laparoscopic repair left pantaloom hernia with mesh, laparoscopic right inguinal hernia with mesh, OPEN REPAIR OF UMBILICAL HERNIA ;  Surgeon: Gayland Curry, MD;  Location: [facility]  ORS;  Service: General;  Laterality: Bilateral;  . LAPAROSCOPY N/A 07/26/2014   Procedure: LAPAROSCOPY DIAGNOSTIC;  Surgeon: Greer Pickerel, MD;  Location: [facility] ORS;  Service: General;  Laterality: N/A;  . POLYPECTOMY  12/29/2015   Procedure: POLYPECTOMY;  Surgeon: Rogene Houston, MD;  Location[facility];  Service: Endoscopy;;  ascending colon, descending colon  . stent placement    . TOTAL HIP ARTHROPLASTY Right 05/10/2015   Procedure: RIGHT TOTAL HIP ARTHROPLASTY ANTERIOR APPROACH;  Surgeon: Paralee Cancel, MD;  Location: [facility] ORS;  Service: Orthopedics;  Laterality: Right;  Marland Kitchen VASECTOMY     Social History   Tobacco Use  . Smoking status: Current Every Day Smoker    Packs/day: 1.00    Years: 45.00    Pack years: 45.00    Types: Cigarettes  . Smokeless tobacco: Never Used  Substance Use Topics  . Alcohol use: No    Comment: past hx of ETOH use was in inpt facility per court order  - 20 years, 10-18-2015 per pt not anymore,per pt stopped about 15-20 yrs ago   family history includes Coronary artery disease in his father; Depression in his father and mother; Heart attack in his father.  Medications: Current Outpatient Medications  Medication Sig Dispense Refill  . 5-Hydroxytryptophan (5-HTP PO) Take 1 tablet by mouth at bedtime.    Marland Kitchen albuterol (PROVENTIL HFA;VENTOLIN HFA) 108 (90 Base) MCG/ACT inhaler Inhale 2 puffs into the lungs every 4 (four) hours as needed for wheezing or shortness of breath. 1 Inhaler 1  . aspirin EC 81 MG tablet Take 1 tablet (81 mg total) by mouth daily. 90 tablet 3  . atorvastatin (LIPITOR) 40 MG tablet TAKE 1 TABLET BY MOUTH ONCE DAILY 30 tablet 0  . baclofen (LIORESAL) 10 MG tablet TAKE 1 TABLET BY MOUTH TWICE DAILY 60 tablet 2  . benzonatate (TESSALON) 200 MG capsule Take 1 capsule (200 mg total) by mouth 3 (three) times daily as needed for cough. 30 capsule 3  . blood glucose meter kit and supplies KIT Dispense based on patient and insurance preference. Use to  check BG once daily or every other other.  Dx: E11.9 1 each 0  . budesonide-formoterol (SYMBICORT) 80-4.5 MCG/ACT inhaler Inhale 2 puffs into the lungs 2 (two) times daily. 1 Inhaler 3  . buPROPion (WELLBUTRIN SR) 150 MG 12 hr tablet TAKE 1 TABLET BY MOUTH TWICE DAILY TO HELP WITH SMOKING CESSATION 180 tablet 1  . Cyanocobalamin (VITAMIN B-12 PO) Take 1,000 mg by mouth at bedtime.     . diclofenac (VOLTAREN) 75 MG EC tablet TAKE 1 TABLET BY MOUTH TWICE DAILY 60 tablet 2  . fluticasone (FLONASE) 50 MCG/ACT nasal spray Place 2 sprays into both nostrils daily. 16 g 6  . gabapentin (NEURONTIN) 300 MG capsule One tab PO qHS for a week, then BID for a week, then TID. May double weekly to a max of 3,'600mg'$ /day 180 capsule 3  . levofloxacin (LEVAQUIN) 750 MG tablet Take 1 tablet (750 mg total) by mouth daily for 7 days. 7 tablet 0  . MELATONIN PO Take by mouth.    . metFORMIN (GLUCOPHAGE-XR) 500 MG 24 hr tablet Take 2 tablets (1,000 mg total) by mouth daily with breakfast. 180 tablet 3  . metoprolol tartrate (LOPRESSOR) 25 MG tablet Take 0.5 tablets (12.5 mg total) by mouth 2 (two) times daily. 90 tablet 1  . sennosides-docusate sodium (SENOKOT-S) 8.6-50 MG tablet Take 1 tablet by mouth 3 (three) times daily as needed for constipation.     . temazepam (RESTORIL) 15 MG capsule TAKE 1 CAPSULE BY MOUTH AT BEDTIME AS NEEDED FOR SLEEP 30 capsule 2  . traMADol (ULTRAM) 50 MG tablet TAKE 1 TABLET BY MOUTH EVERY 12 HOURS AS NEEDED FOR MODERATE OR SEVERE PAIN 30 tablet 1  . VALERIAN ROOT PO Take 1 capsule by mouth at bedtime.     Marland Kitchen HYDROcodone-homatropine (HYCODAN) 5-1.5 MG/5ML syrup Take 5 mLs by mouth every 8 (eight) hours as needed for cough. 120 mL 0  . predniSONE (DELTASONE) 50 MG tablet Take 1 tablet (50 mg total) by mouth daily. 5 tablet 0   No current facility-administered medications for this visit.    Allergies  Allergen Reactions  . Flomax [Tamsulosin Hcl] Other (See Comments)    This medication  makes patient feel sick  . Penicillins Other (  See Comments)    Reaction unknown occurred during childhood Has patient had a PCN reaction causing immediate rash, facial/tongue/throat swelling, SOB or lightheadedness with hypotension: unsure - childhood reaction Has patient had a PCN reaction causing severe rash involving mucus membranes or skin necrosis: unsure - childhood reaction Has patient had a PCN reaction that required hospitalization no Has patient had a PCN reaction occurring within the last 10 years: no If all of the above answers are "NO", then may p     Discussed warning signs or symptoms. Please see discharge instructions. Patient expresses understanding.

## 2018-01-06 NOTE — Patient Instructions (Signed)
Thank you for coming in today. Finish out Levaquin antibiotic.  Use Symbicort twice daily.  Use albuterol inhaler every 4 hours as needed for wheezing or shortness of breat or even cough.  Take prednisone for 5 days.   Use tessalon pearles for cough as needed.  Use hydocordone cough liquid as needed.   Call or go to the emergency room if you get worse, have trouble breathing, have chest pains, or palpitations.

## 2018-01-11 ENCOUNTER — Other Ambulatory Visit: Payer: Self-pay | Admitting: Family

## 2018-01-11 DIAGNOSIS — M791 Myalgia, unspecified site: Secondary | ICD-10-CM

## 2018-01-11 DIAGNOSIS — G4700 Insomnia, unspecified: Secondary | ICD-10-CM

## 2018-01-13 ENCOUNTER — Telehealth: Payer: Self-pay

## 2018-01-13 DIAGNOSIS — M791 Myalgia, unspecified site: Secondary | ICD-10-CM

## 2018-01-13 MED ORDER — BACLOFEN 10 MG PO TABS: 10.0000 mg | ORAL_TABLET | Freq: Two times a day (BID) | ORAL | 2 refills | Status: DC

## 2018-01-13 NOTE — Telephone Encounter (Signed)
[facility] requesting med refill for baclofen 10 mg. Written by external provider. Pls advise, thanks.

## 2018-01-13 NOTE — Telephone Encounter (Signed)
Sent Anthony [name] dicsuss at his upcoming appt

## 2018-01-16 ENCOUNTER — Encounter: Payer: Self-pay | Admitting: Osteopathic Medicine

## 2018-01-16 ENCOUNTER — Ambulatory Visit (INDEPENDENT_AMBULATORY_CARE_PROVIDER_SITE_OTHER): Payer: Medicare Other | Admitting: Osteopathic Medicine

## 2018-01-16 DIAGNOSIS — J439 Emphysema, unspecified: Secondary | ICD-10-CM | POA: Diagnosis not present

## 2018-01-16 DIAGNOSIS — E1169 Type 2 diabetes mellitus with other specified complication: Secondary | ICD-10-CM | POA: Diagnosis not present

## 2018-01-16 DIAGNOSIS — E785 Hyperlipidemia, unspecified: Secondary | ICD-10-CM

## 2018-01-16 DIAGNOSIS — E1165 Type 2 diabetes mellitus with hyperglycemia: Secondary | ICD-10-CM | POA: Diagnosis not present

## 2018-01-16 LAB — POCT GLYCOSYLATED HEMOGLOBIN (HGB A1C): HEMOGLOBIN A1C: 9.3 % — AB (ref 4.0–5.6)

## 2018-01-16 LAB — POCT UA - MICROALBUMIN
Albumin/Creatinine Ratio, Urine, POC: ABNORMAL
Creatinine, POC: 100 mg/dL
Microalbumin Ur, POC: 30 mg/L

## 2018-01-16 MED ORDER — SEMAGLUTIDE(0.25 OR 0.5MG/DOS) 2 MG/1.5ML ~~LOC~~ SOPN: 0.5000 mg | PEN_INJECTOR | SUBCUTANEOUS | 0 refills | Status: DC

## 2018-01-16 MED ORDER — BENZONATATE 200 MG PO CAPS: 200.0000 mg | ORAL_CAPSULE | Freq: Three times a day (TID) | ORAL | 3 refills | Status: DC | PRN

## 2018-01-16 MED ORDER — METOPROLOL SUCCINATE ER 25 MG PO TB24: 12.5000 mg | ORAL_TABLET | Freq: Every day | ORAL | 0 refills | Status: DC

## 2018-01-16 MED ORDER — LISINOPRIL 5 MG PO TABS: 5.0000 mg | ORAL_TABLET | Freq: Every day | ORAL | 3 refills | Status: DC

## 2018-01-16 NOTE — Progress Notes (Signed)
HPI: Anthony [name]. is a 68 y.o. male who  has a past medical history of Anxiety, Arthritis, Bipolar disorder ([place]), CAD (coronary artery disease), COPD (chronic obstructive pulmonary disease) ([place]), Depression, Glucose intolerance (impaired glucose tolerance), History of DVT (deep vein thrombosis), HLD (hyperlipidemia), Hypertension, Myocardial infarction ([place]), Shortness of breath, Tobacco abuse, and Urinary frequency.  he presents to [facility] today, 01/16/18,  for chief complaint of:  DM2 follow-up Shortness of breath     DIABETES SCREENING/PREVENTIVE CARE:  A1C  10/2017: 7.6%  Today, 01/16/18: 9.3%   BP goal <130/80: Yes   BP Readings from Last 3 Encounters:  01/16/18 122/71  01/06/18 118/66  01/02/18 121/75   Wt Readings from Last 3 Encounters:  01/06/18 203 lb (92.1 kg)  12/11/17 207 lb 9.6 oz (94.2 kg)  10/16/17 210 lb 4.8 oz (95.4 kg)   LDL goal <70: No   Last checked 02/2017 was 125, started Lipitor  Eye exam annually: none on file, importance discussed with patient Foot exam: needs  Microalbuminuria: done today, (+), discussed starting ACE Metformin: Yes  ACE/ARB: No  Antiplatelet if ASCVD Risk >10%: Yes  Statin: Yes  Pneumovax: has declined       SOB: Recent treatment for COPD/Pneumonia, slow recovery. Feeling a bit better but still SOB and coughing. Still smoking.           At today's visit... Past medical history, surgical history, and family history reviewed and updated as needed.  Current medication list and allergy/intolerance information reviewed and updated as needed. (See remainder of HPI, ROS, Phys Exam below)     Results for orders placed or performed in visit on 01/16/18 (from the past 72 hour(s))  POCT HgB A1C     Status: Abnormal   Collection Time: 01/16/18  1:27 PM  Result Value Ref Range   Hemoglobin A1C 9.3 (A) 4.0 - 5.6 %   HbA1c POC (<> result, manual entry)     HbA1c, POC  (prediabetic range)     HbA1c, POC (controlled diabetic range)    POCT UA - Microalbumin     Status: None   Collection Time: 01/16/18  1:27 PM  Result Value Ref Range   Microalbumin Ur, POC 30 mg/L   Creatinine, POC 100 mg/dL   Albumin/Creatinine Ratio, Urine, POC abnormal     Comment: 30-300mg /g               ASSESSMENT/PLAN: Diagnoses of Type 2 diabetes mellitus with hyperglycemia, without long-term current use of insulin ([place]), Hyperlipidemia associated with type 2 diabetes mellitus ([place]), and Pulmonary emphysema, unspecified emphysema type ([place]) were pertinent to this visit.   Orders Placed This Encounter  Procedures  . POCT HgB A1C  . POCT UA - Microalbumin     Meds ordered this encounter  Medications  . benzonatate (TESSALON) 200 MG capsule    Sig: Take 1 capsule (200 mg total) by mouth 3 (three) times daily as needed for cough.    Dispense:  30 capsule    Refill:  3  . lisinopril (PRINIVIL,ZESTRIL) 5 MG tablet    Sig: Take 1 tablet (5 mg total) by mouth daily.    Dispense:  90 tablet    Refill:  3  . metoprolol succinate (TOPROL-XL) 25 MG 24 hr tablet    Sig: Take 0.5 tablets (12.5 mg total) by mouth daily.    Dispense:  90 tablet    Refill:  0  . Semaglutide,0.25 or 0.5MG /DOS, (  OZEMPIC, 0.25 OR 0.5 MG/DOSE,) 2 MG/1.5ML SOPN    Sig: Inject 0.5 mg into the skin once a week.    Dispense:  4 pen    Refill:  0    Patient Instructions  Plan:  Sugars are up! A1C went from 7.6 to 9.3 Continue the metformin We are adding a medicine called Ozempic, this is a once a week injection. If there are any problems getting this medication at the pharmacy, please let me know.  Blood pressure is okay but I would like to change the medications a bit to help protect the heart and the kidneys.          Follow-up plan: Return in about 1 week (around 01/23/2018) for PULMONARY FUNCTION TEST AND 6-MINUTE WALK  TEST.                             ############################################ ############################################ ############################################ ############################################    No outpatient medications have been marked as taking for the 01/16/18 encounter (Appointment) with Emeterio Reeve, DO.    Allergies  Allergen Reactions  . Flomax [Tamsulosin Hcl] Other (See Comments)    This medication makes patient feel sick  . Penicillins Other (See Comments)    Reaction unknown occurred during childhood Has patient had a PCN reaction causing immediate rash, facial/tongue/throat swelling, SOB or lightheadedness with hypotension: unsure - childhood reaction Has patient had a PCN reaction causing severe rash involving mucus membranes or skin necrosis: unsure - childhood reaction Has patient had a PCN reaction that required hospitalization no Has patient had a PCN reaction occurring within the last 10 years: no If all of the above answers are "NO", then may p       Review of Systems:  Constitutional: +recent illness  HEENT: No  headache, no vision change  Cardiac: No  chest pain, No  pressure, No palpitations  Respiratory:  +shortness of breath. +Cough  Gastrointestinal: No  abdominal pain  Musculoskeletal: No new myalgia/arthralgia  Skin: No  Rash  Hem/Onc: No  easy bruising/bleeding, No  abnormal lumps/bumps  Neurologic: No  weakness, No  Dizziness  Psychiatric: No  concerns with depression, No  concerns with anxiety  Exam:  BP 122/71 (BP Location: Left Arm, Patient Position: Sitting, Cuff Size: Normal)   Pulse 73   Wt 202 lb 8 oz (91.9 kg)   SpO2 92%   BMI 29.90 kg/m   Constitutional: VS see above. General Appearance: alert, well-developed, well-nourished, NAD  Eyes: Normal lids and conjunctive, non-icteric sclera  Ears, Nose, Mouth, Throat: MMM, Normal external inspection  ears/nares/mouth/lips/gums.  Neck: No masses, trachea midline.   Respiratory: Normal respiratory effort. no wheeze, no rhonchi, no rales  Cardiovascular: S1/S2 normal, no murmur, no rub/gallop auscultated. RRR. No LE edema.   Musculoskeletal: Gait normal. Symmetric and independent movement of all extremities  Neurological: Normal balance/coordination. No tremor.  Skin: warm, dry, intact.   Psychiatric: Normal judgment/insight. Normal mood and affect. Oriented x3.       Visit summary with medication list and pertinent instructions was printed for patient to review, patient was advised to alert [place] if any updates are needed. All questions at time of visit were answered - patient instructed to contact office with any additional concerns. ER/RTC precautions were reviewed with the patient and understanding verbalized.    Please note: voice recognition software was used to produce this document, and typos may escape review. Please contact Dr. Sheppard Coil for any needed clarifications.  Follow up plan: Return in about 1 week (around 01/23/2018) for PULMONARY FUNCTION TEST AND 6-MINUTE WALK TEST.

## 2018-01-16 NOTE — Patient Instructions (Addendum)
Plan:  Sugars are up! A1C went from 7.6 to 9.3 Continue the metformin We are adding a medicine called Ozempic, this is a once a week injection. If there are any problems getting this medication at the pharmacy, please let me know.  Blood pressure is okay but I would like to change the medications a bit to help protect the heart and the kidneys.

## 2018-01-17 ENCOUNTER — Telehealth: Payer: Self-pay

## 2018-01-17 MED ORDER — DAPAGLIFLOZIN PROPANEDIOL 10 MG PO TABS: 10.0000 mg | ORAL_TABLET | Freq: Every day | ORAL | 0 refills | Status: DC

## 2018-01-17 NOTE — Telephone Encounter (Signed)
Patient advised.

## 2018-01-17 NOTE — Telephone Encounter (Signed)
[facility] called and states even with the coupon card the Ozempic cost $175 a month.

## 2018-01-17 NOTE — Telephone Encounter (Signed)
OK, I'd have him call his insurance company and ask them to send [place] a list of what is on his formulary. In the meantime, I'm sending Anthony [name], hopefully this will be covered. It's a daily pill.

## 2018-01-23 ENCOUNTER — Ambulatory Visit (INDEPENDENT_AMBULATORY_CARE_PROVIDER_SITE_OTHER): Payer: Medicare Other | Admitting: Osteopathic Medicine

## 2018-01-23 VITALS — BP 100/64 | HR 68 | Temp 98.6°F | Wt 207.0 lb

## 2018-01-23 DIAGNOSIS — J441 Chronic obstructive pulmonary disease with (acute) exacerbation: Secondary | ICD-10-CM

## 2018-01-23 MED ORDER — ALBUTEROL SULFATE (2.5 MG/3ML) 0.083% IN NEBU
2.5000 mg | INHALATION_SOLUTION | Freq: Once | RESPIRATORY_TRACT | Status: AC
  Administered 2018-01-23: 2.5 mg via RESPIRATORY_TRACT

## 2018-01-23 MED ORDER — FLUTICASONE-UMECLIDIN-VILANT 100-62.5-25 MCG/INH IN AEPB: 1.0000 | INHALATION_SPRAY | Freq: Every day | RESPIRATORY_TRACT | 0 refills | Status: DC

## 2018-01-23 MED ORDER — GLIPIZIDE 10 MG PO TABS: 10.0000 mg | ORAL_TABLET | Freq: Two times a day (BID) | ORAL | 3 refills | Status: DC

## 2018-01-23 NOTE — Progress Notes (Signed)
HPI: Anthony [name]. is a 68 y.o. male who  has a past medical history of Anxiety, Arthritis, Bipolar disorder ([place]), CAD (coronary artery disease), COPD (chronic obstructive pulmonary disease) ([place]), Depression, Glucose intolerance (impaired glucose tolerance), History of DVT (deep vein thrombosis), HLD (hyperlipidemia), Hypertension, Myocardial infarction ([place]), Shortness of breath, Tobacco abuse, and Urinary frequency.  he presents to [facility] today, 01/23/18,  for chief complaint of:  Spirometry  Persistent SOB on exertion and fatigue. Spirometry shows moderate obstructive pattern. Has been on Trelegy before. Currently Symbicort.     At today's visit... Past medical history, surgical history, and family history reviewed and updated as needed.  Current medication list and allergy/intolerance information reviewed and updated as needed. (See remainder of HPI, ROS, Phys Exam below)           ASSESSMENT/PLAN: The encounter diagnosis was COPD exacerbation ([place]).   Orders Placed This Encounter  Procedures  . PR EVAL OF BRONCHOSPASM     Meds ordered this encounter  Medications  . albuterol (PROVENTIL) (2.5 MG/3ML) 0.083% nebulizer solution 2.5 mg    Patient Instructions  Will stop Symbicort Start Trelegy  Will get the ball rolling on home O2        Follow-up plan: Return in about 4 weeks (around 02/20/2018) for recheck COPD .                             ############################################ ############################################ ############################################ ############################################    No outpatient medications have been marked as taking for the 01/23/18 encounter (Office Visit) with Emeterio Reeve, DO.    Allergies  Allergen Reactions  . Flomax [Tamsulosin Hcl] Other (See Comments)    This medication makes patient feel sick  . Penicillins  Other (See Comments)    Reaction unknown occurred during childhood Has patient had a PCN reaction causing immediate rash, facial/tongue/throat swelling, SOB or lightheadedness with hypotension: unsure - childhood reaction Has patient had a PCN reaction causing severe rash involving mucus membranes or skin necrosis: unsure - childhood reaction Has patient had a PCN reaction that required hospitalization no Has patient had a PCN reaction occurring within the last 10 years: no If all of the above answers are "NO", then may p       Review of Systems:  Constitutional: No recent illness  HEENT: No  headache, no vision change  Cardiac: No  chest pain, No  pressure, No palpitations  Respiratory:  +shortness of breath. +Cough  Gastrointestinal: No  abdominal pain, no change on bowel habits  Neurologic: No  weakness, No  Dizziness  Psychiatric: +concerns with depression d/t fatigue and SOB, No  concerns with anxiety  Exam:  BP 100/64   Pulse 68   Temp 98.6 F (37 C) (Oral)   Wt 207 lb (93.9 kg)   BMI 30.57 kg/m   Constitutional: VS see above. General Appearance: alert, well-developed, well-nourished, NAD  Eyes: Normal lids and conjunctive, non-icteric sclera  Ears, Nose, Mouth, Throat: MMM, Normal external inspection ears/nares/mouth/lips/gums.  Neck: No masses, trachea midline.   Respiratory: Normal respiratory effort. no wheeze, no rhonchi, no rales, diminshed breath sounds bilaterally  Cardiovascular: S1/S2 normal, no murmur, no rub/gallop auscultated. RRR.   Musculoskeletal: Gait normal. Symmetric and independent movement of all extremities  Abdominal: non-tender, non-distended, no appreciable organomegaly, neg Murphy's, BS [id number]  Neurological: Normal balance/coordination. No tremor.  Skin: warm, dry, intact.   Psychiatric: Normal judgment/insight. Normal mood  and affect. Oriented x3.         Visit summary with medication list and pertinent instructions was  printed for patient to review, patient was advised to alert [place] if any updates are needed. All questions at time of visit were answered - patient instructed to contact office with any additional concerns. ER/RTC precautions were reviewed with the patient and understanding verbalized.      Please note: voice recognition software was used to produce this document, and typos may escape review. Please contact Dr. Sheppard Coil for any needed clarifications.    Follow up plan: Return in about 4 weeks (around 02/20/2018) for recheck COPD .

## 2018-01-23 NOTE — Patient Instructions (Addendum)
Will stop Symbicort Start Trelegy  Will get the ball rolling on home O2

## 2018-01-24 ENCOUNTER — Telehealth: Payer: Self-pay

## 2018-01-24 DIAGNOSIS — G4700 Insomnia, unspecified: Secondary | ICD-10-CM

## 2018-01-24 NOTE — Telephone Encounter (Signed)
Pt called stating provider was going to send in a rx for temazepam to [facility]. Pls advise, thanks.

## 2018-01-27 MED ORDER — EMPAGLIFLOZIN 25 MG PO TABS: 25.0000 mg | ORAL_TABLET | Freq: Every day | ORAL | 1 refills | Status: DC

## 2018-01-27 MED ORDER — TEMAZEPAM 15 MG PO CAPS: 15.0000 mg | ORAL_CAPSULE | Freq: Every evening | ORAL | 0 refills | Status: DC | PRN

## 2018-01-27 NOTE — Telephone Encounter (Signed)
Left VM with status update.  

## 2018-01-27 NOTE — Telephone Encounter (Signed)
Marcha Dutton taken off list, Jardiance sent

## 2018-01-27 NOTE — Telephone Encounter (Signed)
The [facility]ade is not cover. However, Jardiance and Anastasio Auerbach is preferred.

## 2018-01-27 NOTE — Telephone Encounter (Signed)
Patient advised.

## 2018-01-27 NOTE — Addendum Note (Signed)
Addended by: Maryla Morrow on: 01/27/2018 12:54 PM   Modules accepted: Orders

## 2018-01-27 NOTE — Telephone Encounter (Signed)
Sent! Apologies for the delay and thanks for reminding me.

## 2018-02-11 ENCOUNTER — Other Ambulatory Visit: Payer: Self-pay | Admitting: Family

## 2018-02-11 ENCOUNTER — Encounter: Payer: Self-pay | Admitting: Osteopathic Medicine

## 2018-02-11 ENCOUNTER — Ambulatory Visit (INDEPENDENT_AMBULATORY_CARE_PROVIDER_SITE_OTHER): Payer: Medicare Other

## 2018-02-11 ENCOUNTER — Ambulatory Visit (INDEPENDENT_AMBULATORY_CARE_PROVIDER_SITE_OTHER): Payer: Medicare Other | Admitting: Osteopathic Medicine

## 2018-02-11 VITALS — BP 109/67 | HR 75 | Temp 98.8°F | Wt 208.1 lb

## 2018-02-11 DIAGNOSIS — E1165 Type 2 diabetes mellitus with hyperglycemia: Secondary | ICD-10-CM | POA: Diagnosis not present

## 2018-02-11 DIAGNOSIS — Z720 Tobacco use: Secondary | ICD-10-CM

## 2018-02-11 DIAGNOSIS — R05 Cough: Secondary | ICD-10-CM

## 2018-02-11 DIAGNOSIS — R918 Other nonspecific abnormal finding of lung field: Secondary | ICD-10-CM | POA: Diagnosis not present

## 2018-02-11 DIAGNOSIS — I2510 Atherosclerotic heart disease of native coronary artery without angina pectoris: Secondary | ICD-10-CM

## 2018-02-11 DIAGNOSIS — R059 Cough, unspecified: Secondary | ICD-10-CM

## 2018-02-11 DIAGNOSIS — J479 Bronchiectasis, uncomplicated: Secondary | ICD-10-CM

## 2018-02-11 DIAGNOSIS — J441 Chronic obstructive pulmonary disease with (acute) exacerbation: Secondary | ICD-10-CM

## 2018-02-11 NOTE — Progress Notes (Signed)
HPI: Anthony [name]. is a 68 y.o. male who  has a past medical history of Anxiety, Arthritis, Bipolar disorder ([place]), CAD (coronary artery disease), COPD (chronic obstructive pulmonary disease) ([place]), Depression, Glucose intolerance (impaired glucose tolerance), History of DVT (deep vein thrombosis), HLD (hyperlipidemia), Hypertension, Myocardial infarction ([place]), Shortness of breath, Tobacco abuse, and Urinary frequency.  he presents to [facility] today, 02/13/18,  for chief complaint of: Sick - respiratory  . COPD has been an issue, at 6-minute walk test his SaO2 was not decreased enough to qualify for home O2. CXR concerning for PNA 01/02/18 at urgent care and he was treated for this. Feeling improved but not back to his baseline.  . Location: chest and sinuses . Quality: coughing, SOB, weakness . Duration: about 2 months . Modifying factors: few rounds of treatment for COPD exacerbation. Inhalers as below.  . Associated symptoms: depression. He is getting very frustrated and depressed with his health deteriorating.   He's not sure which medicines he is taking, not taking the inhalers, he says he is having some financial difficulty paying for medications.       Past medical, surgical, social and family history reviewed and updated as necessary.   Current medication list and allergy/intolerance information reviewed:    Current Outpatient Medications  Medication Sig Dispense Refill  . albuterol (PROVENTIL HFA;VENTOLIN HFA) 108 (90 Base) MCG/ACT inhaler Inhale 2 puffs into the lungs every 4 (four) hours as needed for wheezing or shortness of breath. 1 Inhaler 1  . aspirin EC 81 MG tablet Take 1 tablet (81 mg total) by mouth daily. 90 tablet 3  . baclofen (LIORESAL) 10 MG tablet Take 1 tablet (10 mg total) by mouth 2 (two) times daily. 60 tablet 2  . benzonatate (TESSALON) 200 MG capsule Take 1 capsule (200 mg total) by mouth 3 (three) times daily  as needed for cough. 30 capsule 3  . blood glucose meter kit and supplies KIT Dispense based on patient and insurance preference. Use to check BG once daily or every other other.  Dx: E11.9 1 each 0  . Cyanocobalamin (VITAMIN B-12 PO) Take 1,000 mg by mouth at bedtime.     . diclofenac (VOLTAREN) 75 MG EC tablet TAKE 1 TABLET BY MOUTH TWICE DAILY 60 tablet 2  . fluticasone (FLONASE) 50 MCG/ACT nasal spray Place 2 sprays into both nostrils daily. 16 g 6  . Fluticasone-Umeclidin-Vilant (TRELEGY ELLIPTA) 100-62.5-25 MCG/INH AEPB Inhale 1 puff into the lungs daily. 3 each 0  . gabapentin (NEURONTIN) 300 MG capsule One tab PO qHS for a week, then BID for a week, then TID. May double weekly to a max of 3,662m/day 180 capsule 3  . glipiZIDE (GLUCOTROL) 10 MG tablet Take 1 tablet (10 mg total) by mouth 2 (two) times daily before a meal. 60 tablet 3  . MELATONIN PO Take by mouth.    . Semaglutide,0.25 or 0.5MG/DOS, (OZEMPIC, 0.25 OR 0.5 MG/DOSE,) 2 MG/1.5ML SOPN Inject 0.5 mg into the skin once a week. 4 pen 0  . sennosides-docusate sodium (SENOKOT-S) 8.6-50 MG tablet Take 1 tablet by mouth 3 (three) times daily as needed for constipation.     . temazepam (RESTORIL) 15 MG capsule Take 1 capsule (15 mg total) by mouth at bedtime as needed for sleep. 30 capsule 0  . 5-Hydroxytryptophan (5-HTP PO) Take 1 tablet by mouth at bedtime.    .Marland Kitchenatorvastatin (LIPITOR) 40 MG tablet Take 1 tablet (40 mg total) by  mouth daily. 90 tablet 3  . buPROPion (WELLBUTRIN SR) 150 MG 12 hr tablet TAKE 1 TABLET BY MOUTH TWICE DAILY TO HELP WITH SMOKING CESSATION (Patient not taking: Reported on 02/11/2018) 180 tablet 1  . empagliflozin (JARDIANCE) 25 MG TABS tablet Take 25 mg by mouth daily. (Patient not taking: Reported on 02/11/2018) 90 tablet 1  . HYDROcodone-homatropine (HYCODAN) 5-1.5 MG/5ML syrup Take 5 mLs by mouth every 8 (eight) hours as needed for cough. (Patient not taking: Reported on 02/11/2018) 120 mL 0  . lisinopril  (PRINIVIL,ZESTRIL) 5 MG tablet Take 1 tablet (5 mg total) by mouth daily. (Patient not taking: Reported on 02/11/2018) 90 tablet 3  . metFORMIN (GLUCOPHAGE-XR) 500 MG 24 hr tablet Take 2 tablets (1,000 mg total) by mouth daily with breakfast. (Patient not taking: Reported on 02/11/2018) 180 tablet 3  . metoprolol succinate (TOPROL-XL) 25 MG 24 hr tablet Take 0.5 tablets (12.5 mg total) by mouth daily. (Patient not taking: Reported on 02/11/2018) 90 tablet 0  . predniSONE (DELTASONE) 50 MG tablet Take 1 tablet (50 mg total) by mouth daily. (Patient not taking: Reported on 02/11/2018) 5 tablet 0  . traMADol (ULTRAM) 50 MG tablet TAKE 1 TABLET BY MOUTH EVERY 12 HOURS AS NEEDED FOR MODERATE OR SEVERE PAIN (Patient not taking: Reported on 02/11/2018) 30 tablet 1  . VALERIAN ROOT PO Take 1 capsule by mouth at bedtime.      No current facility-administered medications for this visit.     Allergies  Allergen Reactions  . Flomax [Tamsulosin Hcl] Other (See Comments)    This medication makes patient feel sick  . Penicillins Other (See Comments)    Reaction unknown occurred during childhood Has patient had a PCN reaction causing immediate rash, facial/tongue/throat swelling, SOB or lightheadedness with hypotension: unsure - childhood reaction Has patient had a PCN reaction causing severe rash involving mucus membranes or skin necrosis: unsure - childhood reaction Has patient had a PCN reaction that required hospitalization no Has patient had a PCN reaction occurring within the last 10 years: no If all of the above answers are "NO", then may p      Review of Systems:  Constitutional:  No  fever, no chills, +recent illness, No unintentional weight changes. +significant fatigue.   HEENT: No  headache, no vision change, no hearing change, No sore throat, +sinus pressure  Cardiac: No  chest pain, No  pressure, No palpitations  Respiratory:  +shortness of breath. +Cough  Gastrointestinal: No  abdominal pain,  No  nausea  Musculoskeletal: No new myalgia/arthralgia  Skin: No  Rash  Neurologic: +generalized weakness, No  dizziness  Exam:  BP 109/67 (BP Location: Left Arm, Patient Position: Sitting, Cuff Size: Normal)   Pulse 75   Temp 98.8 F (37.1 C) (Oral)   Wt 208 lb 1.6 oz (94.4 kg)   SpO2 94%   BMI 30.73 kg/m   Constitutional: VS see above. General Appearance: alert, well-developed, well-nourished, NAD  Eyes: Normal lids and conjunctive, non-icteric sclera  Ears, Nose, Mouth, Throat: MMM, Normal external inspection ears/nares/mouth/lips/gums.   Neck: No masses, trachea midline.  Respiratory: Normal respiratory effort. +diffuse coarse wheeze, no rhonchi, no rales, diminished breath sounds   Cardiovascular: S1/S2 normal, no murmur, no rub/gallop auscultated. RRR.   Musculoskeletal: Gait normal.   Skin: warm, dry, intact.   Psychiatric: Normal judgment/insight. Normal mood and affect.    Ct Chest Wo Contrast  Result Date: 02/11/2018 CLINICAL DATA:  COPD exacerbation EXAM: CT CHEST WITHOUT CONTRAST TECHNIQUE: Multidetector  CT imaging of the chest was performed following the standard protocol without IV contrast. COMPARISON:  Chest radiographs, 01/02/2018 FINDINGS: Cardiovascular: Three-vessel coronary artery calcifications. Normal heart size. No pericardial effusion. Mediastinum/Nodes: No enlarged mediastinal, hilar, or axillary lymph nodes. Thyroid gland, trachea, and esophagus demonstrate no significant findings. Lungs/Pleura: There is moderate emphysema, predominantly in a paraseptal pattern. At the bilateral lung bases, there is subtle peripheral interstitial opacity and bronchiolectasis. There is mild tubular bronchiectasis without significant traction. There are multiple small pulmonary nodules, for example a 6 mm nodule in the left lung base (series 3, image 96) and a 6 mm nodule of the right upper lobe (series 3, image 66). No pleural effusion or pneumothorax. Upper Abdomen:  No acute abnormality. Musculoskeletal: No chest wall mass or suspicious bone lesions identified.   IMPRESSION:   1. There is moderate emphysema, predominantly in a paraseptal pattern. At the bilateral lung bases, there is subtle peripheral interstitial opacity and bronchiolectasis. 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 There is no acute appearing airspace opacity.   2. Multiple small bilateral pulmonary nodules measuring up to 6 mm. Recommend initial follow-up CT at 3-6 months and subsequently at 18-24 months to establish stability.   3.  Coronary artery disease.   Electronically Signed   By: Eddie Candle M.D.   On: 02/11/2018 15:18     ASSESSMENT/PLAN: The primary encounter diagnosis was Cough. Diagnoses of Chronic obstructive pulmonary disease with acute exacerbation ([place]), Type 2 diabetes mellitus with hyperglycemia, without long-term current use of insulin ([place]), Tobacco abuse, Coronary artery disease involving native coronary artery of native heart without angina pectoris, and Bronchiectasis without complication ([place]) were also pertinent to this visit.   Discussion with patient re: medications/adherence - let's have him back bring all medications and bottles and inhalers, we can figure out what he NEEDS to take, what he can afford, getting organized with taking everything on time, etc.     Orders Placed This Encounter  Procedures  . CT Chest Wo Contrast   Images reviewed w/ patient Will set reminder in Epic to repeat in 3 mos.   There are no Patient Instructions on file for this visit.     Visit summary with medication list and pertinent instructions was printed for patient to review. All questions at time of visit were answered - patient instructed to contact office with any additional concerns or updates. ER/RTC precautions were reviewed with the patient.   Follow-up plan: Return for review medications - bring pill bottles!.  Note: Total time spent 25  minutes, greater than 50% of the visit was spent face-to-face counseling and coordinating care for the following: The primary encounter diagnosis was Cough. Diagnoses of Chronic obstructive pulmonary disease with acute exacerbation ([place]), Type 2 diabetes mellitus with hyperglycemia, without long-term current use of insulin ([place]), Tobacco abuse, Coronary artery disease involving native coronary artery of native heart without angina pectoris, and Bronchiectasis without complication ([place]) were also pertinent to this visit.Marland Kitchen  Please note: voice recognition software was used to produce this document, and typos may escape review. Please contact Dr. Sheppard Coil for any needed clarifications.

## 2018-02-12 ENCOUNTER — Telehealth: Payer: Self-pay

## 2018-02-12 NOTE — Telephone Encounter (Signed)
Pt called requesting med refill for atorvastatin. Written by external provider. Pls send to [facility].

## 2018-02-13 ENCOUNTER — Encounter: Payer: Self-pay | Admitting: Osteopathic Medicine

## 2018-02-13 ENCOUNTER — Other Ambulatory Visit: Payer: Self-pay

## 2018-02-13 DIAGNOSIS — I739 Peripheral vascular disease, unspecified: Secondary | ICD-10-CM

## 2018-02-13 DIAGNOSIS — I724 Aneurysm of artery of lower extremity: Secondary | ICD-10-CM

## 2018-02-13 DIAGNOSIS — I872 Venous insufficiency (chronic) (peripheral): Secondary | ICD-10-CM

## 2018-02-13 MED ORDER — ATORVASTATIN CALCIUM 40 MG PO TABS: 40.0000 mg | ORAL_TABLET | Freq: Every day | ORAL | 3 refills | Status: DC

## 2018-02-17 ENCOUNTER — Ambulatory Visit (INDEPENDENT_AMBULATORY_CARE_PROVIDER_SITE_OTHER): Payer: Medicare Other | Admitting: Osteopathic Medicine

## 2018-02-17 ENCOUNTER — Encounter: Payer: Self-pay | Admitting: Osteopathic Medicine

## 2018-02-17 VITALS — BP 129/75 | HR 70 | Temp 97.6°F | Wt 212.6 lb

## 2018-02-17 DIAGNOSIS — J441 Chronic obstructive pulmonary disease with (acute) exacerbation: Secondary | ICD-10-CM | POA: Diagnosis not present

## 2018-02-17 DIAGNOSIS — J410 Simple chronic bronchitis: Secondary | ICD-10-CM

## 2018-02-17 DIAGNOSIS — M791 Myalgia, unspecified site: Secondary | ICD-10-CM

## 2018-02-17 DIAGNOSIS — I2510 Atherosclerotic heart disease of native coronary artery without angina pectoris: Secondary | ICD-10-CM

## 2018-02-17 DIAGNOSIS — J439 Emphysema, unspecified: Secondary | ICD-10-CM

## 2018-02-17 DIAGNOSIS — E1169 Type 2 diabetes mellitus with other specified complication: Secondary | ICD-10-CM

## 2018-02-17 DIAGNOSIS — J479 Bronchiectasis, uncomplicated: Secondary | ICD-10-CM

## 2018-02-17 DIAGNOSIS — E785 Hyperlipidemia, unspecified: Secondary | ICD-10-CM

## 2018-02-17 DIAGNOSIS — E1165 Type 2 diabetes mellitus with hyperglycemia: Secondary | ICD-10-CM

## 2018-02-17 MED ORDER — CLOTRIMAZOLE 1 % EX CREA: 1.0000 "application " | TOPICAL_CREAM | Freq: Two times a day (BID) | CUTANEOUS | 0 refills | Status: DC

## 2018-02-17 MED ORDER — GABAPENTIN 300 MG PO CAPS: 300.0000 mg | ORAL_CAPSULE | Freq: Three times a day (TID) | ORAL | 3 refills | Status: DC

## 2018-02-17 MED ORDER — BACLOFEN 10 MG PO TABS: 10.0000 mg | ORAL_TABLET | Freq: Two times a day (BID) | ORAL | 2 refills | Status: DC

## 2018-02-17 MED ORDER — ALBUTEROL SULFATE HFA 108 (90 BASE) MCG/ACT IN AERS: 2.0000 | INHALATION_SPRAY | RESPIRATORY_TRACT | 1 refills | Status: DC | PRN

## 2018-02-17 MED ORDER — BUPROPION HCL ER (XL) 300 MG PO TB24: 300.0000 mg | ORAL_TABLET | Freq: Every day | ORAL | 3 refills | Status: DC

## 2018-02-17 MED ORDER — BUDESONIDE-FORMOTEROL FUMARATE 80-4.5 MCG/ACT IN AERO: 2.0000 | INHALATION_SPRAY | Freq: Two times a day (BID) | RESPIRATORY_TRACT | 5 refills | Status: DC

## 2018-02-17 NOTE — Patient Instructions (Signed)
SYMBICORT Red inhaler - TWICE A DAY EVERY DAY ALBUTEROL Blue inhaler - IN ADDITION TO RED INHALER IF NEEDED  WELLBUTRIN - increase from 150 mg to 300 mg - NEW RX SENT TO [facility]  AS-NEEDED ONLY: GABAPENTIN BACLOFEN ALLERGY  SENNA BENZONATATE (COUGH) MUCUS RELIEF (COUGH)  TEMAZEPAM (SLEEP)

## 2018-02-17 NOTE — Progress Notes (Signed)
HPI: Anthony [name]. is a 68 y.o. male who  has a past medical history of Anxiety, Arthritis, Bipolar disorder ([place]), CAD (coronary artery disease), COPD (chronic obstructive pulmonary disease) ([place]), Depression, Glucose intolerance (impaired glucose tolerance), History of DVT (deep vein thrombosis), HLD (hyperlipidemia), Hypertension, Myocardial infarction ([place]), Shortness of breath, Tobacco abuse, and Urinary frequency.  he presents to [facility] today, 02/17/18,  for chief complaint of:  Go over meds - brings all pill bottles and inhalers  COUGH/COPD  Taking Albuterol first thing in the morning, then again around lunchtime. Taking Symbicort sometimes...   OTC: Benadryl, guaifenesin, Flonase  Rx: Tessalon - doesn't really help    DM2:  Metformin XR 500 taking 1000 daily, Glipizide 10 mg bid. Has not been able to afford non-generic medicines.   CARDIAC  ASA 81, Lisinopril 5 mg, Metoprolol XR 12.5 mg bid, Atorvastatin 40 mg. Doing well on these.   PAIN  Taking baclofen and gabapentin both bid no matter what. Alternating diclofenac and other NSAID. Not really taking anything "as needed."   OTHER:  Multivitamins, B12, C, Zinc  Senna as needed   Eye vitamin          At today's visit 02/17/18 ... PMH, PSH, FH reviewed and updated as needed.  Current medication list and allergy/intolerance hx reviewed and updated as needed. (See remainder of HPI, ROS, Phys Exam below)           ASSESSMENT/PLAN: The primary encounter diagnosis was Coronary artery disease involving native coronary artery of native heart without angina pectoris. Diagnoses of Simple chronic bronchitis ([place]), Pulmonary emphysema, unspecified emphysema type ([place]), COPD exacerbation ([place]), Muscle pain, Type 2 diabetes mellitus with hyperglycemia, without long-term current use of insulin ([place]), Bronchiectasis without complication ([place]), Hyperlipidemia associated with  type 2 diabetes mellitus ([place]), and Uncontrolled type 2 diabetes mellitus with hyperglycemia ([place]) were also pertinent to this visit.   Streamline meds:  OTC supplements/vitamins are ok, not needed  Reviewed meds for DM2, COPD, CAD. Reviewed proper administration of inhalers   Pain management: daily NSAID probably ok, would not take baclofen and gabapentin together as this is likely contributing to fatigue     Meds ordered this encounter  Medications  . albuterol (PROVENTIL HFA;VENTOLIN HFA) 108 (90 Base) MCG/ACT inhaler    Sig: Inhale 2 puffs into the lungs every 4 (four) hours as needed for wheezing or shortness of breath (AS NEEDED - RESCUE INHALER).    Dispense:  1 Inhaler    Refill:  1  . baclofen (LIORESAL) 10 MG tablet    Sig: Take 1 tablet (10 mg total) by mouth 2 (two) times daily. AS NEEDED FOR MUSCLE SPASM    Dispense:  60 tablet    Refill:  2    Please consider 90 day supplies to promote better adherence  . buPROPion (WELLBUTRIN XL) 300 MG 24 hr tablet    Sig: Take 1 tablet (300 mg total) by mouth daily.    Dispense:  90 tablet    Refill:  3  . gabapentin (NEURONTIN) 300 MG capsule    Sig: Take 1 capsule (300 mg total) by mouth 3 (three) times daily. AS NEEDED FOR NERVE PAIN    Dispense:  180 capsule    Refill:  3  . budesonide-formoterol (SYMBICORT) 80-4.5 MCG/ACT inhaler    Sig: Inhale 2 puffs into the lungs 2 (two) times daily. EVERY DAY    Dispense:  1 Inhaler    Refill:  5  . clotrimazole (CLOTRIMAZOLE ANTI-FUNGAL) 1 % cream    Sig: Apply 1 application topically 2 (two) times daily.    Dispense:  30 g    Refill:  0    Patient Instructions  SYMBICORT Red inhaler - TWICE A DAY EVERY DAY ALBUTEROL Blue inhaler - IN ADDITION TO RED INHALER IF NEEDED  WELLBUTRIN - increase from 150 mg to 300 mg - NEW RX SENT TO [facility]  AS-NEEDED ONLY: GABAPENTIN BACLOFEN ALLERGY  SENNA BENZONATATE (COUGH) MUCUS RELIEF (COUGH)  TEMAZEPAM (SLEEP)          Follow-up plan: Return for RECHECK A1C 04/2018, sooner if needed .                                                 ################################################# ################################################# ################################################# #################################################           Review of Systems:  Constitutional: No recent illness  HEENT: No  headache, no vision change  Cardiac: No  chest pain, No  pressure, No palpitations  Respiratory:  +chronic shortness of breath. +Cough  Gastrointestinal: No  abdominal pain  Musculoskeletal: No new myalgia/arthralgia  Psychiatric: +concerns with depression, No  concerns with anxiety  Exam:  BP 129/75 (BP Location: Left Arm, Patient Position: Sitting, Cuff Size: Normal)   Pulse 70   Temp 97.6 F (36.4 C) (Oral)   Wt 212 lb 9.6 oz (96.4 kg)   SpO2 93%   BMI 31.40 kg/m   Constitutional: VS see above. General Appearance: alert, well-developed, well-nourished, NAD  Eyes: Normal lids and conjunctive, non-icteric sclera  Ears, Nose, Mouth, Throat: MMM, Normal external inspection ears/nares/mouth/lips/gums.  Neck: No masses, trachea midline.   Respiratory: Normal respiratory effort. no wheeze, no rhonchi, no rales  Cardiovascular: S1/S2 normal, no rub/gallop auscultated. RRR.   Musculoskeletal: Gait normal. Symmetric and independent movement of all extremities  Neurological: Normal balance/coordination. No tremor.  Skin: warm, dry, intact.   Psychiatric: Normal judgment/insight. Normal mood and affect. Oriented x3.       Visit summary with medication list and pertinent instructions was printed for patient to review, patient was advised to alert [place] if any updates are needed. All questions at time of visit were answered - patient instructed to contact office with any additional concerns. ER/RTC precautions were  reviewed with the patient and understanding verbalized.   Note: Total time spent 40 minutes, greater than 50% of the visit was spent face-to-face counseling and coordinating care for the following: The primary encounter diagnosis was Coronary artery disease involving native coronary artery of native heart without angina pectoris. Diagnoses of Simple chronic bronchitis ([place]), Pulmonary emphysema, unspecified emphysema type ([place]), COPD exacerbation ([place]), Muscle pain, Type 2 diabetes mellitus with hyperglycemia, without long-term current use of insulin ([place]), Bronchiectasis without complication ([place]), Hyperlipidemia associated with type 2 diabetes mellitus ([place]), and Uncontrolled type 2 diabetes mellitus with hyperglycemia ([place]) were also pertinent to this visit.Marland Kitchen  Please note: voice recognition software was used to produce this document, and typos may escape review. Please contact Dr. Sheppard Coil for any needed clarifications.    Follow up plan: Return for RECHECK A1C 04/2018, sooner if needed .

## 2018-02-20 ENCOUNTER — Telehealth: Payer: Self-pay

## 2018-02-20 NOTE — Telephone Encounter (Signed)
Per states he feels nauseated. He states he did start taking an increase dose of Wellbutrin. He takes the Wellbutrin 300 mg. He was on the Wellbutrin 150 mg weeks ago and stopped taking it. He is curious to know if it could be because her restarted at the 300 mg dose instead of taking the 150 mg first.

## 2018-02-21 NOTE — Telephone Encounter (Signed)
Left message advising patient

## 2018-02-21 NOTE — Telephone Encounter (Signed)
Patient advised.

## 2018-02-21 NOTE — Telephone Encounter (Signed)
Going back to full dose of Wellbutrin may be the reason.  He can try going back to the 150 mg if desired -if he needs a new prescription okay to send 150 mg Wellbutrin extended release to the pharmacy #[id number] for 30 days

## 2018-02-24 ENCOUNTER — Other Ambulatory Visit: Payer: Self-pay | Admitting: Family

## 2018-02-24 ENCOUNTER — Ambulatory Visit: Payer: Self-pay | Admitting: Osteopathic Medicine

## 2018-02-24 DIAGNOSIS — M79605 Pain in left leg: Secondary | ICD-10-CM

## 2018-02-24 DIAGNOSIS — M159 Polyosteoarthritis, unspecified: Secondary | ICD-10-CM

## 2018-02-25 ENCOUNTER — Other Ambulatory Visit: Payer: Self-pay

## 2018-02-25 DIAGNOSIS — M159 Polyosteoarthritis, unspecified: Secondary | ICD-10-CM

## 2018-02-25 DIAGNOSIS — M79605 Pain in left leg: Secondary | ICD-10-CM

## 2018-02-25 MED ORDER — DICLOFENAC SODIUM 75 MG PO TBEC: 75.0000 mg | DELAYED_RELEASE_TABLET | Freq: Two times a day (BID) | ORAL | 2 refills | Status: DC

## 2018-02-25 NOTE — Telephone Encounter (Signed)
Left message advising patient

## 2018-02-25 NOTE — Telephone Encounter (Signed)
Anthony [name] requests a refill on diclofenac. It has never been prescribed by Dr Sheppard Coil. Please advise.

## 2018-03-12 ENCOUNTER — Encounter: Payer: Self-pay | Admitting: Osteopathic Medicine

## 2018-03-12 ENCOUNTER — Ambulatory Visit (INDEPENDENT_AMBULATORY_CARE_PROVIDER_SITE_OTHER): Payer: Medicare Other | Admitting: Osteopathic Medicine

## 2018-03-12 ENCOUNTER — Ambulatory Visit (INDEPENDENT_AMBULATORY_CARE_PROVIDER_SITE_OTHER): Payer: Medicare Other

## 2018-03-12 VITALS — BP 125/57 | HR 59 | Temp 98.2°F | Wt 208.5 lb

## 2018-03-12 DIAGNOSIS — J449 Chronic obstructive pulmonary disease, unspecified: Secondary | ICD-10-CM | POA: Diagnosis not present

## 2018-03-12 DIAGNOSIS — Z720 Tobacco use: Secondary | ICD-10-CM | POA: Diagnosis not present

## 2018-03-12 DIAGNOSIS — J439 Emphysema, unspecified: Secondary | ICD-10-CM

## 2018-03-12 DIAGNOSIS — R059 Cough, unspecified: Secondary | ICD-10-CM

## 2018-03-12 DIAGNOSIS — R05 Cough: Secondary | ICD-10-CM | POA: Diagnosis not present

## 2018-03-12 MED ORDER — IPRATROPIUM-ALBUTEROL 0.5-2.5 (3) MG/3ML IN SOLN: 3.0000 mL | RESPIRATORY_TRACT | 99 refills | Status: DC | PRN

## 2018-03-12 MED ORDER — IPRATROPIUM-ALBUTEROL 0.5-2.5 (3) MG/3ML IN SOLN: 3.0000 mL | RESPIRATORY_TRACT | 0 refills | Status: DC | PRN

## 2018-03-12 MED ORDER — NEBULIZER COMPRESSOR MISC: 1 refills | Status: DC

## 2018-03-12 MED ORDER — NEBULIZER/TUBING/MOUTHPIECE KIT: PACK | 99 refills | Status: DC

## 2018-03-12 NOTE — Patient Instructions (Addendum)
With COPD, smoking history, uncontrolled diabetes - you are at risk for lung infections, and you will likely have some chronic shortness of breath and productive cough.   For mild worsening or flares, can use the nebulizer as needed in addition to your other usual medicines and inhalers.   For more severe symptoms, please come see me to evaluate and that way I can decide whether you need steroids, antibiotics, chest Xray, etc.

## 2018-03-12 NOTE — Progress Notes (Signed)
HPI: Anthony [name]. is a 68 y.o. male who  has a past medical history of Anxiety, Arthritis, Bipolar disorder ([place]), CAD (coronary artery disease), COPD (chronic obstructive pulmonary disease) ([place]), Depression, Glucose intolerance (impaired glucose tolerance), History of DVT (deep vein thrombosis), HLD (hyperlipidemia), Hypertension, Myocardial infarction ([place]), Shortness of breath, Tobacco abuse, and Urinary frequency.  he presents to [facility] today, 03/12/18,  for chief complaint of:  Cough  . Context: known COPD, bronchiectasis. Inhalers as below,  . Location/Quality: chest congestion, productive cough, no fever . Severity: about the same as usual, he's worried he "just can't seem to shake it"    At today's visit 03/12/18 ... PMH, PSH, FH reviewed and updated as needed.  Current medication list and allergy/intolerance hx reviewed and updated as needed. (See remainder of HPI, ROS, Phys Exam below)   Dg Chest 2 View  Result Date: 03/12/2018 CLINICAL DATA:  Cough, fever, and congestion for the past 2 days. EXAM: CHEST - 2 VIEW COMPARISON:  CT chest dated February 11, 2018. Chest x-ray dated January 02, 2018. FINDINGS: The heart size and mediastinal contours are within normal limits. Normal pulmonary vascularity. The lungs remain emphysematous. Basilar predominant interstitial thickening is unchanged. No focal consolidation, pleural effusion, or pneumothorax. No acute osseous abnormality. IMPRESSION: 1. Stable chronic interstitial lung changes without active cardiopulmonary disease. 2. COPD. Electronically Signed   By: Titus Dubin M.D.   On: 03/12/2018 12:06    CXR personally reviewed, I don't see obvious PNA, ?mild infiltrate vs normal on L lateral side but radiology over-read shows no concerns.        ASSESSMENT/PLAN: The primary encounter diagnosis was Pulmonary emphysema, unspecified emphysema type ([place]). Diagnoses of Tobacco abuse and  Cough were also pertinent to this visit.   Orders Placed This Encounter  Procedures  . DG Chest 2 View     Meds ordered this encounter  Medications  . Nebulizers (NEBULIZER COMPRESSOR) MISC    Sig: Per insurance coverage and patient preference. Dx COPD    Dispense:  1 each    Refill:  1  . Respiratory Therapy Supplies (NEBULIZER/TUBING/MOUTHPIECE) KIT    Sig: Per insurance coverage and patient preference. Dx COPD    Dispense:  1 each    Refill:  99  . DISCONTD: ipratropium-albuterol (DUONEB) 0.5-2.5 (3) MG/3ML SOLN    Sig: Take 3 mLs by nebulization every 2 (two) hours as needed (wheeze, cough, short of breath).    Dispense:  60 mL    Refill:  99  . ipratropium-albuterol (DUONEB) 0.5-2.5 (3) MG/3ML SOLN    Sig: Take 3 mLs by nebulization every 2 (two) hours as needed (wheeze, cough, short of breath).    Dispense:  60 mL    Refill:  0    Patient Instructions  With COPD, smoking history, uncontrolled diabetes - you are at risk for lung infections, and you will likely have some chronic shortness of breath and productive cough.   For mild worsening or flares, can use the nebulizer as needed in addition to your other usual medicines and inhalers.   For more severe symptoms, please come see me to evaluate and that way I can decide whether you need steroids, antibiotics, chest Xray, etc.         Follow-up plan: Return for keep currenlty scheduled appt 04/07 to recheck diabetes, see me sooner if needed .                                                 ################################################# ################################################# ################################################# #################################################  Current Meds  Medication Sig  . albuterol (PROVENTIL HFA;VENTOLIN HFA) 108 (90 Base) MCG/ACT inhaler Inhale 2 puffs into the lungs every 4 (four) hours as needed for wheezing or  shortness of breath (AS NEEDED - RESCUE INHALER).  Marland Kitchen aspirin EC 81 MG tablet Take 1 tablet (81 mg total) by mouth daily.  Marland Kitchen atorvastatin (LIPITOR) 40 MG tablet Take 1 tablet (40 mg total) by mouth daily.  . baclofen (LIORESAL) 10 MG tablet Take 1 tablet (10 mg total) by mouth 2 (two) times daily. AS NEEDED FOR MUSCLE SPASM  . blood glucose meter kit and supplies KIT Dispense based on patient and insurance preference. Use to check BG once daily or every other other.  Dx: E11.9  . budesonide-formoterol (SYMBICORT) 80-4.5 MCG/ACT inhaler Inhale 2 puffs into the lungs 2 (two) times daily. EVERY DAY  . buPROPion (WELLBUTRIN XL) 300 MG 24 hr tablet Take 1 tablet (300 mg total) by mouth daily.  . Cyanocobalamin (VITAMIN B-12 PO) Take 1,000 mg by mouth at bedtime.   . diclofenac (VOLTAREN) 75 MG EC tablet Take 1 tablet (75 mg total) by mouth 2 (two) times daily.  . fluticasone (FLONASE) 50 MCG/ACT nasal spray Place 2 sprays into both nostrils daily.  Marland Kitchen gabapentin (NEURONTIN) 300 MG capsule Take 1 capsule (300 mg total) by mouth 3 (three) times daily. AS NEEDED FOR NERVE PAIN  . glipiZIDE (GLUCOTROL) 10 MG tablet Take 1 tablet (10 mg total) by mouth 2 (two) times daily before a meal.  . lisinopril (PRINIVIL,ZESTRIL) 5 MG tablet Take 1 tablet (5 mg total) by mouth daily.  . metFORMIN (GLUCOPHAGE-XR) 500 MG 24 hr tablet Take 2 tablets (1,000 mg total) by mouth daily with breakfast.  . metoprolol succinate (TOPROL-XL) 25 MG 24 hr tablet Take 0.5 tablets (12.5 mg total) by mouth daily.  . sennosides-docusate sodium (SENOKOT-S) 8.6-50 MG tablet Take 1 tablet by mouth 3 (three) times daily as needed for constipation.   . temazepam (RESTORIL) 15 MG capsule Take 1 capsule (15 mg total) by mouth at bedtime as needed for sleep.    Allergies  Allergen Reactions  . Flomax [Tamsulosin Hcl] Other (See Comments)    This medication makes patient feel sick  . Penicillins Other (See Comments)    Reaction unknown  occurred during childhood Has patient had a PCN reaction causing immediate rash, facial/tongue/throat swelling, SOB or lightheadedness with hypotension: unsure - childhood reaction Has patient had a PCN reaction causing severe rash involving mucus membranes or skin necrosis: unsure - childhood reaction Has patient had a PCN reaction that required hospitalization no Has patient had a PCN reaction occurring within the last 10 years: no If all of the above answers are "NO", then may p       Review of Systems:  Constitutional: No recent illness  HEENT: No  headache, no vision change  Cardiac: No  chest pain, No  pressure, No palpitations  Respiratory:  +shortness of breath. +Cough  Gastrointestinal: No  abdominal pain, no change on bowel habits  Musculoskeletal: No new myalgia/arthralgia  Neurologic: No  weakness, No  Dizziness   Exam:  BP (!) 125/57 (BP Location: Left Arm, Patient Position: Sitting, Cuff Size: Normal)   Pulse (!) 59   Temp 98.2 F (36.8 C) (Oral)   Wt 208 lb 8 oz (94.6 kg)   SpO2 94%   BMI 30.79 kg/m   Constitutional: VS see above. General Appearance: alert, well-developed, well-nourished, NAD  Eyes: Normal lids and conjunctive, non-icteric sclera  Ears, Nose, Mouth, Throat: MMM, Normal external inspection ears/nares/mouth/lips/gums.  Neck: No masses, trachea midline.   Respiratory: Normal respiratory effort. +left sided wheeze, no rhonchi, no rales,  diminished breath sounds bilaterally   Cardiovascular: S1/S2 normal, no murmur, no rub/gallop auscultated. RRR.   Musculoskeletal: Gait normal. Symmetric and independent movement of all extremities  Neurological: Normal balance/coordination. No tremor.  Skin: warm, dry, intact.   Psychiatric: Normal judgment/insight. Normal mood and affect. Oriented x3.       Visit summary with medication list and pertinent instructions was printed for patient to review, patient was advised to alert [place] if any  updates are needed. All questions at time of visit were answered - patient instructed to contact office with any additional concerns. ER/RTC precautions were reviewed with the patient and understanding verbalized.     Please note: voice recognition software was used to produce this document, and typos may escape review. Please contact Dr. Sheppard Coil for any needed clarifications.    Follow up plan: Return for keep currenlty scheduled appt 04/07 to recheck diabetes, see me sooner if needed .

## 2018-03-18 LAB — HM DIABETES EYE EXAM

## 2018-03-19 ENCOUNTER — Other Ambulatory Visit: Payer: Self-pay

## 2018-03-19 DIAGNOSIS — G4700 Insomnia, unspecified: Secondary | ICD-10-CM

## 2018-03-19 MED ORDER — TEMAZEPAM 15 MG PO CAPS: 15.0000 mg | ORAL_CAPSULE | Freq: Every evening | ORAL | 2 refills | Status: DC | PRN

## 2018-03-19 NOTE — Telephone Encounter (Signed)
Anthony [name] request a refill on Temazepam.

## 2018-03-24 ENCOUNTER — Telehealth: Payer: Self-pay

## 2018-03-24 DIAGNOSIS — M159 Polyosteoarthritis, unspecified: Secondary | ICD-10-CM

## 2018-03-24 DIAGNOSIS — M79605 Pain in left leg: Secondary | ICD-10-CM

## 2018-03-24 MED ORDER — DICLOFENAC SODIUM 75 MG PO TBEC: 75.0000 mg | DELAYED_RELEASE_TABLET | Freq: Two times a day (BID) | ORAL | 2 refills | Status: DC

## 2018-03-24 MED ORDER — GABAPENTIN 300 MG PO CAPS: 300.0000 mg | ORAL_CAPSULE | Freq: Three times a day (TID) | ORAL | 3 refills | Status: DC

## 2018-03-24 NOTE — Telephone Encounter (Signed)
Pt called requesting a RF on his Tramadol for leg pain.  Pt reports still taking Gabapentin 300 mg, TID but sometimes states he needs Tramadol for breakthrough pain. Last RX for Tramadol was written 09/17/17 by [name].   Pt states Dr Georgina Snell is managing his leg pain- note to Dr Georgina Snell

## 2018-03-24 NOTE — Telephone Encounter (Signed)
Refill diclofenac pill and gabapentin.  Tramadol is a controlled substance and requires an office visit.

## 2018-03-24 NOTE — Telephone Encounter (Signed)
Pt also requesting RF sent for Diclofenac, las written by [name] also

## 2018-03-24 NOTE — Telephone Encounter (Signed)
Pt advised, appt scheduled for tomorrow for Tramadol

## 2018-03-25 ENCOUNTER — Encounter: Payer: Self-pay | Admitting: Family Medicine

## 2018-03-25 ENCOUNTER — Other Ambulatory Visit: Payer: Self-pay

## 2018-03-25 ENCOUNTER — Ambulatory Visit
Admission: RE | Admit: 2018-03-25 | Discharge: 2018-03-25 | Disposition: A | Discharge status: Home / Self Care | Payer: Medicare Other | Source: Ambulatory Visit | Attending: Vascular Surgery | Admitting: Vascular Surgery

## 2018-03-25 ENCOUNTER — Ambulatory Visit (INDEPENDENT_AMBULATORY_CARE_PROVIDER_SITE_OTHER): Payer: Medicare Other | Admitting: Family Medicine

## 2018-03-25 VITALS — BP 141/88 | HR 62 | Temp 98.2°F | Wt 213.0 lb

## 2018-03-25 DIAGNOSIS — I723 Aneurysm of iliac artery: Secondary | ICD-10-CM | POA: Diagnosis not present

## 2018-03-25 DIAGNOSIS — I724 Aneurysm of artery of lower extremity: Secondary | ICD-10-CM

## 2018-03-25 DIAGNOSIS — I77811 Abdominal aortic ectasia: Secondary | ICD-10-CM | POA: Diagnosis not present

## 2018-03-25 DIAGNOSIS — G894 Chronic pain syndrome: Secondary | ICD-10-CM

## 2018-03-25 DIAGNOSIS — I739 Peripheral vascular disease, unspecified: Secondary | ICD-10-CM

## 2018-03-25 DIAGNOSIS — I872 Venous insufficiency (chronic) (peripheral): Secondary | ICD-10-CM

## 2018-03-25 MED ORDER — IOPAMIDOL (ISOVUE-300) INJECTION 61%
125.0000 mL | Freq: Once | INTRAVENOUS | Status: AC | PRN
  Administered 2018-03-25: 125 mL via INTRAVENOUS

## 2018-03-25 MED ORDER — TRAMADOL HCL 50 MG PO TABS: 50.0000 mg | ORAL_TABLET | Freq: Every day | ORAL | 1 refills | Status: DC | PRN

## 2018-03-25 NOTE — Patient Instructions (Signed)
Thank you for coming in today. Use tramadol as needed.  Try to take less than daily.  Request refill when needed.  We will needed visit every 3 months for this.  I think those visits can be done during your visit with Dr Sheppard Coil for Diabetes.

## 2018-03-25 NOTE — Progress Notes (Signed)
Anthony Form. is a 68 y.o. male who presents to [place] today for discuss back and leg pain.  Anthony [name] has chronic low back pain and left leg pain.  In the past he is done well with intermittent tramadol.  He will typically take 50 mg of tramadol every other day or so.  Additionally he takes topical diclofenac gel, Tylenol, and gabapentin which help.  He uses temazepam very infrequently.  He would like to continue tramadol if possible.  He notes that it helps improve his ability to function.  He denies obnoxious side effects.    ROS:  As above  Exam:  BP (!) 141/88   Pulse 62   Temp 98.2 F (36.8 C) (Oral)   Wt 213 lb (96.6 kg)   BMI 31.45 kg/m  Wt Readings from Last 5 Encounters:  03/25/18 213 lb (96.6 kg)  03/12/18 208 lb 8 oz (94.6 kg)  02/17/18 212 lb 9.6 oz (96.4 kg)  02/11/18 208 lb 1.6 oz (94.4 kg)  01/23/18 207 lb (93.9 kg)   General: Well Developed, well nourished, and in no acute distress.  Neuro/Psych: Alert and oriented x3, extra-ocular muscles intact, able to move all 4 extremities, sensation grossly intact. Skin: Warm and dry, no rashes noted.  Respiratory: Not using accessory muscles, speaking in full sentences, trachea midline.  Cardiovascular: Pulses palpable, no extremity edema. Abdomen: Does not appear distended. MSK:  L-spine: Nontender to midline.  Decreased lumbar motion.  Mild antalgic gait.      Assessment and Plan: 68 y.o. male with chronic back and leg pain.  Doing reasonably well.  Reasonable to use low-dose intermittent tramadol.  Cautioned against using it with temazepam.  Discussed rules and regulations.  Plan for urine drug screen and medication use agreement.  Discussed with primary care provider.  She will likely take over this prescribing during her every 85-monthvisits for diabetes follow-up with DDaiseanin the future.   PDMP reviewed during this encounter. Orders Placed This  Encounter  Procedures  . Pain Mgmt, Profile 4 Conf w/o mM, U   Meds ordered this encounter  Medications  . traMADol (ULTRAM) 50 MG tablet    Sig: Take 1 tablet (50 mg total) by mouth daily as needed for severe pain.    Dispense:  30 tablet    Refill:  1    Historical information moved to improve visibility of documentation.  Past Medical History:  Diagnosis Date  . Anxiety   . Arthritis   . [place]   . CAD (coronary artery disease)    a. INF STEMI 07/04/10:  [facility]  [facility] 07/04/10: dLM 10-20%, pLAD 40-50%, mLAD 20-30%, pRCA 30%, mRCA occluded and tx with PCI, EF 50% with inf HK. A [facility]  . COPD (chronic obstructive pulmonary disease) ([place]   . Depression   . Glucose intolerance (impaired glucose tolerance)    A1c 6.2 06/2010  . History of DVT (deep vein thrombosis)    traumatic, s/p coumadin tx.  .Marland KitchenHLD (hyperlipidemia)   . Hypertension    pt states is currently on no medications  . Myocardial infarction ([place]    around 2013  . Shortness of breath    walking distance or climbing stairs  . Tobacco abuse   . Urinary frequency    Past Surgical History:  Procedure Laterality Date  . CARDIAC CATHETERIZATION    . COLONOSCOPY N/A 12/29/2015  Procedure: COLONOSCOPY;  Surgeon: Rogene Houston, MD;  [facility];  Service: Endoscopy;  Laterality: N/A;  12:55  . ELECTROCARDIOGRAM     Showed inferolateral ST-elevation and a code STEMI was activated. In the ER, he was treated with morphine, herarin, and 600 mg of Plavix. He was transferred emergently to [facility].   . INGUINAL HERNIA REPAIR Left 07/26/2014   Procedure: OPEN REPAIR OF RECURENT LEFT INGUINAL HERNIA REPAIR WITH MESH;  Surgeon: Greer Pickerel, MD;  Location: [facility] ORS;  Service: General;  Laterality: Left;  . INSERTION OF MESH Bilateral 10/30/2013   Procedure: INSERTION OF MESH;  Surgeon: Gayland Curry, MD;  Location: [facility];  Service: General;  Laterality:  Bilateral;  . LAPAROSCOPIC INGUINAL HERNIA WITH UMBILICAL HERNIA Bilateral 10/30/2013   Procedure: laparoscopic repair left pantaloom hernia with mesh, laparoscopic right inguinal hernia with mesh, OPEN REPAIR OF UMBILICAL HERNIA ;  Surgeon: Gayland Curry, MD;  Location: [facility];  Service: General;  Laterality: Bilateral;  . LAPAROSCOPY N/A 07/26/2014   Procedure: LAPAROSCOPY DIAGNOSTIC;  Surgeon: Greer Pickerel, MD;  Location: [facility];  Service: General;  Laterality: N/A;  . POLYPECTOMY  12/29/2015   Procedure: POLYPECTOMY;  Surgeon: Rogene Houston, MD;  [facility];  Service: Endoscopy;;  ascending colon, descending colon  . stent placement    . TOTAL HIP ARTHROPLASTY Right 05/10/2015   Procedure: RIGHT TOTAL HIP ARTHROPLASTY ANTERIOR APPROACH;  Surgeon: Paralee Cancel, MD;  Location: [facility];  Service: Orthopedics;  Laterality: Right;  Marland Kitchen VASECTOMY     Social History   Tobacco Use  . Smoking status: Current Every Day Smoker    Packs/day: 1.00    Years: 45.00    Pack years: 45.00    Types: Cigarettes  . Smokeless tobacco: Never Used  Substance Use Topics  . Alcohol use: No    Comment: past hx of ETOH use was in inpt facility per court order  - 20 years, 10-18-2015 per pt not anymore,per pt stopped about 15-20 yrs ago   family history includes Coronary artery disease in his father; Depression in his father and mother; Heart attack in his father.  Medications: Current Outpatient Medications  Medication Sig Dispense Refill  . albuterol (PROVENTIL HFA;VENTOLIN HFA) 108 (90 Base) MCG/ACT inhaler Inhale 2 puffs into the lungs every 4 (four) hours as needed for wheezing or shortness of breath (AS NEEDED - RESCUE INHALER). 1 Inhaler 1  . aspirin EC 81 MG tablet Take 1 tablet (81 mg total) by mouth daily. 90 tablet 3  . atorvastatin (LIPITOR) 40 MG tablet Take 1 tablet (40 mg total) by mouth daily. 90 tablet 3  . baclofen (LIORESAL) 10 MG tablet Take 1 tablet (10 mg total) by mouth 2 (two)  times daily. AS NEEDED FOR MUSCLE SPASM 60 tablet 2  . blood glucose meter kit and supplies KIT Dispense based on patient and insurance preference. Use to check BG once daily or every other other.  Dx: E11.9 1 each 0  . budesonide-formoterol (SYMBICORT) 80-4.5 MCG/ACT inhaler Inhale 2 puffs into the lungs 2 (two) times daily. EVERY DAY 1 Inhaler 5  . buPROPion (WELLBUTRIN XL) 300 MG 24 hr tablet Take 1 tablet (300 mg total) by mouth daily. 90 tablet 3  . Cyanocobalamin (VITAMIN B-12 PO) Take 1,000 mg by mouth at bedtime.     . diclofenac (VOLTAREN) 75 MG EC tablet Take 1 tablet (75 mg total) by mouth 2 (two) times daily. [place]  tablet 2  . fluticasone (FLONASE) 50 MCG/ACT nasal spray Place 2 sprays into both nostrils daily. 16 g 6  . gabapentin (NEURONTIN) 300 MG capsule Take 1 capsule (300 mg total) by mouth 3 (three) times daily. AS NEEDED FOR NERVE PAIN 180 capsule 3  . glipiZIDE (GLUCOTROL) 10 MG tablet Take 1 tablet (10 mg total) by mouth 2 (two) times daily before a meal. 60 tablet 3  . ipratropium-albuterol (DUONEB) 0.5-2.5 (3) MG/3ML SOLN Take 3 mLs by nebulization every 2 (two) hours as needed (wheeze, cough, short of breath). 60 mL 0  . lisinopril (PRINIVIL,ZESTRIL) 5 MG tablet Take 1 tablet (5 mg total) by mouth daily. 90 tablet 3  . metFORMIN (GLUCOPHAGE-XR) 500 MG 24 hr tablet Take 2 tablets (1,000 mg total) by mouth daily with breakfast. 180 tablet 3  . metoprolol succinate (TOPROL-XL) 25 MG 24 hr tablet Take 0.5 tablets (12.5 mg total) by mouth daily. 90 tablet 0  . Nebulizers (NEBULIZER COMPRESSOR) MISC Per insurance coverage and patient preference. Dx COPD 1 each 1  . Respiratory Therapy Supplies (NEBULIZER/TUBING/MOUTHPIECE) KIT Per insurance coverage and patient preference. Dx COPD 1 each 15  . sennosides-docusate sodium (SENOKOT-S) 8.6-50 MG tablet Take 1 tablet by mouth 3 (three) times daily as needed for constipation.     . temazepam (RESTORIL) 15 MG capsule Take 1 capsule (15 mg  total) by mouth at bedtime as needed for sleep. Sparing use to prevent dependence 30 capsule 2  . traMADol (ULTRAM) 50 MG tablet Take 1 tablet (50 mg total) by mouth daily as needed for severe pain. 30 tablet 1   No current facility-administered medications for this visit.    Allergies  Allergen Reactions  . Flomax [Tamsulosin Hcl] Other (See Comments)    This medication makes patient feel sick  . Penicillins Other (See Comments)    Reaction unknown occurred during childhood Has patient had a PCN reaction causing immediate rash, facial/tongue/throat swelling, SOB or lightheadedness with hypotension: unsure - childhood reaction Has patient had a PCN reaction causing severe rash involving mucus membranes or skin necrosis: unsure - childhood reaction Has patient had a PCN reaction that required hospitalization no Has patient had a PCN reaction occurring within the last 10 years: no If all of the above answers are "NO", then may p      Discussed warning signs or symptoms. Please see discharge instructions. Patient expresses understanding.

## 2018-03-26 ENCOUNTER — Ambulatory Visit: Payer: Self-pay | Admitting: Vascular Surgery

## 2018-03-26 ENCOUNTER — Telehealth: Payer: Self-pay | Admitting: *Deleted

## 2018-03-26 LAB — PAIN MGMT, PROFILE 4 CONF W/O MM, U
Amphetamines: NEGATIVE ng/mL (ref <500)
Barbiturates: NEGATIVE ng/mL (ref <300)
Benzodiazepines: NEGATIVE ng/mL (ref <100)
Cocaine Metabolite: NEGATIVE ng/mL (ref <150)
Creatinine: 55.7 mg/dL
Methadone Metabolite: NEGATIVE ng/mL (ref <100)
OXIDANT: NEGATIVE ug/mL (ref <200)
Opiates: NEGATIVE ng/mL (ref <100)
Oxycodone: NEGATIVE ng/mL (ref <100)
Phencyclidine: NEGATIVE ng/mL (ref <25)
pH: 5.76 (ref 4.5–9.0)

## 2018-03-26 NOTE — Telephone Encounter (Signed)
I spoke with pt regarding appointment on March 26,2020 with Dr. [name] for one year follow up. Pt reports he is doing well. He is agreeable to postponing this appointment. I told pt we would call him back to reschedule

## 2018-04-02 ENCOUNTER — Ambulatory Visit: Payer: Self-pay | Admitting: Vascular Surgery

## 2018-04-03 ENCOUNTER — Ambulatory Visit: Payer: Self-pay | Admitting: Cardiovascular Disease

## 2018-04-09 ENCOUNTER — Telehealth: Payer: Self-pay | Admitting: Osteopathic Medicine

## 2018-04-10 NOTE — Telephone Encounter (Signed)
error 

## 2018-04-14 ENCOUNTER — Encounter (INDEPENDENT_AMBULATORY_CARE_PROVIDER_SITE_OTHER): Payer: Self-pay

## 2018-04-14 NOTE — Telephone Encounter (Signed)
Virtual Visit Pre-Appointment Phone Call   TELEPHONE CALL NOTE  Christopher Glasscock. has been deemed a candidate for a follow-up tele-health visit to limit community exposure during the Covid-19 pandemic. I spoke with the patient via phone to ensure availability of phone/video source, confirm preferred email & phone number, and discuss instructions and expectations.  I reminded Tito Ausmus. to be prepared with any vital sign and/or heart rhythm information that could potentially be obtained via home monitoring, at the time of his visit. I reminded Eliyah Bazzi. to expect a phone call at the time of his visit if his visit.  Did the patient verbally acknowledge consent to treatment? YES  Patient agrees to VIDEO Visit with Ermalinda Barrios, PA tomorrow using [place], RN 04/14/2018 5:34 PM   DOWNLOADING THE [place], go to [employer] and type in WebEx in the search bar. [place] [employer], the blue/green circle. The app is free but as with any other app downloads, their phone may require them to verify saved payment information or Apple password. The patient does NOT have to create an account.  - If Android, ask patient to go to [employer] and type in WebEx in the search bar. [place] [employer], the blue/green circle. The app is free but as with any other app downloads, their phone may require them to verify saved payment information or Android password. The patient does NOT have to create an account.   CONSENT FOR TELE-HEALTH VISIT - PLEASE REVIEW  I hereby voluntarily request, consent and authorize [facility] and its employed or contracted physicians, physician assistants, nurse practitioners or other licensed health care professionals (the Practitioner), to provide me with telemedicine health care services (the "Services") as deemed necessary by the treating Practitioner. I acknowledge and consent to  receive the Services by the Practitioner via telemedicine. I understand that the telemedicine visit will involve communicating with the Practitioner through live audiovisual communication technology and the disclosure of certain medical information by electronic transmission. I acknowledge that I have been given the opportunity to request an in-person assessment or other available alternative prior to the telemedicine visit and am voluntarily participating in the telemedicine visit.  I understand that I have the right to withhold or withdraw my consent to the use of telemedicine in the course of my care at any time, without affecting my right to future care or treatment, and that the Practitioner or I may terminate the telemedicine visit at any time. I understand that I have the right to inspect all information obtained and/or recorded in the course of the telemedicine visit and may receive copies of available information for a reasonable fee.  I understand that some of the potential risks of receiving the Services via telemedicine include:  Marland Kitchen Delay or interruption in medical evaluation due to technological equipment failure or disruption; . Information transmitted may not be sufficient (e.g. poor resolution of images) to allow for appropriate medical decision making by the Practitioner; and/or  . In rare instances, security protocols could fail, causing a breach of personal health information.  Furthermore, I acknowledge that it is my responsibility to provide information about my medical history, conditions and care that is complete and accurate to the best of my ability. I acknowledge that Practitioner's advice, recommendations, and/or decision may be based on factors not within their control, such as incomplete or inaccurate data provided by  me or distortions of diagnostic images or specimens that may result from electronic transmissions. I understand that the practice of medicine is not an exact science  and that Practitioner makes no warranties or guarantees regarding treatment outcomes. I acknowledge that I will receive a copy of this consent concurrently upon execution via email to the email address I last provided but may also request a printed copy by calling the office of [place].    I understand that my insurance will be billed for this visit.   I have read or had this consent read to me. . I understand the contents of this consent, which adequately explains the benefits and risks of the Services being provided via telemedicine.  . I have been provided ample opportunity to ask questions regarding this consent and the Services and have had my questions answered to my satisfaction. . I give my informed consent for the services to be provided through the use of telemedicine in my medical care  By participating in this telemedicine visit I agree to the above.

## 2018-04-14 NOTE — Telephone Encounter (Signed)
I can see this patient as yearly f/u on doximity or web ex if you can set it up. thanks

## 2018-04-15 ENCOUNTER — Encounter: Payer: Self-pay | Admitting: Physician Assistant

## 2018-04-15 ENCOUNTER — Other Ambulatory Visit: Payer: Self-pay

## 2018-04-15 ENCOUNTER — Ambulatory Visit (INDEPENDENT_AMBULATORY_CARE_PROVIDER_SITE_OTHER): Payer: Medicare Other | Admitting: Osteopathic Medicine

## 2018-04-15 ENCOUNTER — Telehealth (INDEPENDENT_AMBULATORY_CARE_PROVIDER_SITE_OTHER): Payer: Medicare Other | Admitting: Physician Assistant

## 2018-04-15 ENCOUNTER — Encounter: Payer: Self-pay | Admitting: Osteopathic Medicine

## 2018-04-15 VITALS — BP 113/65 | HR 68 | Temp 97.5°F | Wt 211.2 lb

## 2018-04-15 VITALS — BP 113/65 | HR 68 | Ht 69.0 in | Wt 211.3 lb

## 2018-04-15 DIAGNOSIS — Z720 Tobacco use: Secondary | ICD-10-CM

## 2018-04-15 DIAGNOSIS — J439 Emphysema, unspecified: Secondary | ICD-10-CM | POA: Diagnosis not present

## 2018-04-15 DIAGNOSIS — E1165 Type 2 diabetes mellitus with hyperglycemia: Secondary | ICD-10-CM

## 2018-04-15 DIAGNOSIS — I2510 Atherosclerotic heart disease of native coronary artery without angina pectoris: Secondary | ICD-10-CM

## 2018-04-15 DIAGNOSIS — E782 Mixed hyperlipidemia: Secondary | ICD-10-CM

## 2018-04-15 DIAGNOSIS — I10 Essential (primary) hypertension: Secondary | ICD-10-CM

## 2018-04-15 LAB — POCT GLYCOSYLATED HEMOGLOBIN (HGB A1C): Hemoglobin A1C: 7.8 % — AB (ref 4.0–5.6)

## 2018-04-15 MED ORDER — TRAMADOL HCL 50 MG PO TABS: 50.0000 mg | ORAL_TABLET | Freq: Every day | ORAL | 2 refills | Status: DC | PRN

## 2018-04-15 NOTE — Patient Instructions (Signed)
Plan: Sugars better but still room for improvement, would recommend cutting back on carbohydrates (sugars, breads/pastas) and increasing exercise as tolerated.  I sent referral to lung doctor to see if there's anything else we can do to improve breathing.  I sent refill of Tramadol, you should be good for 3 months.

## 2018-04-15 NOTE — Progress Notes (Signed)
HPI: Anthony [name]. is a 68 y.o. male who  has a past medical history of Anxiety, Arthritis, Bipolar disorder ([place]), CAD (coronary artery disease), COPD (chronic obstructive pulmonary disease) ([place]), Depression, Glucose intolerance (impaired glucose tolerance), History of DVT (deep vein thrombosis), HLD (hyperlipidemia), Hypertension, Myocardial infarction ([place]), Shortness of breath, Tobacco abuse, and Urinary frequency.  he presents to [facility] today, 04/15/18,  for chief complaint of: DM2 follow-up    DIABETES SCREENING/PREVENTIVE CARE:  A1C  10/2017: 7.6%  01/16/18: 9.3% pt has been unable to afford non-generic medications, only taking max metformin and glipizide  Today 04/15/18: 7.8% doing ok with diet, not really exercising   BP goal <130/80: Yes   BP Readings from Last 3 Encounters:  04/15/18 113/65  04/15/18 113/65  03/25/18 (!) 141/88   Wt Readings from Last 3 Encounters:  04/15/18 211 lb 4.8 oz (95.8 kg)  04/15/18 211 lb 3.2 oz (95.8 kg)  03/25/18 213 lb (96.6 kg)   LDL goal <70: No   Last checked 02/2017 was 125, started Lipitor   Eye exam annually: none on file, importance discussed with patient Foot exam: needs  Microalbuminuria: done previous visit this year, (+), discussed starting ACE  Metformin: Yes  ACE/ARB: Yes, lisinopril low dose at 5 mg   Antiplatelet if ASCVD Risk >10%: Yes  Statin: Yes  Pneumovax: has declined      Chronic pain:  Recently seen by Dr Georgina Snell  Baclofen 10 mg bid prn Gabapengin 300 mg tid prn  Diclofenac 75 mg bid prn  Tramadol 50 mg daily prn severe pain #30 w/ 1 refill ordered by Dr Georgina Snell 03/25/2018, UDS was appropriate, tramadol was discussed w/ me. See notes for details.      Feeling discouraged that he's taking all these medicines and not feeling any better. Has increased smoking from 1 to 1.5 ppd     Past medical, surgical, social and family history reviewed:  Patient  Active Problem List   Diagnosis Date Noted  . Insomnia 03/26/2017  . Aortic atherosclerosis ([place]) 10/19/2016  . Pain medication agreement signed 06/05/2016  . Opioid dependence ([place]) 06/05/2016  . Diabetes mellitus without complication ([place]) 24/23/5361  . Obesity (BMI 30-39.9) 04/13/2016  . Rectal bleeding 11/23/2015  . Moderate episode of recurrent major depressive disorder ([place]) 10/18/2015  . S/P right THA, AA 05/10/2015  . Recurrent left inguinal hernia 07/26/2014  . Chronic right hip pain 06/23/2014  . S/P bilateral inguinal hernia repair 10/30/2013  . Nodule of left lung 05/29/2013  . Bilateral inguinal hernia 05/28/2013  . Umbilical hernia 44/31/5400  . Major psychotic depression, recurrent ([place]) 12/16/2012  . Inadequate anticoagulation 04/29/2012  . Hypocalcemia 07/28/2010  . CAD (coronary artery disease)   . Hyperlipidemia associated with type 2 diabetes mellitus ([place])   . COPD (chronic obstructive pulmonary disease) ([place])   . Tobacco abuse     Past Surgical History:  Procedure Laterality Date  . CARDIAC CATHETERIZATION    . COLONOSCOPY N/A 12/29/2015   Procedure: COLONOSCOPY;  Surgeon: Rogene Houston, MD;  [facility];  Service: Endoscopy;  Laterality: N/A;  12:55  . ELECTROCARDIOGRAM     Showed inferolateral ST-elevation and a code STEMI was activated. In the ER, he was treated with morphine, herarin, and 600 mg of Plavix. He was transferred emergently to [facility].   . INGUINAL HERNIA REPAIR Left 07/26/2014   Procedure: OPEN REPAIR OF RECURENT LEFT INGUINAL HERNIA REPAIR WITH MESH;  Surgeon: Greer Pickerel, MD;  [facility]: [facility];  Service: General;  Laterality: Left;  . INSERTION OF MESH Bilateral 10/30/2013   Procedure: INSERTION OF MESH;  Surgeon: Gayland Curry, MD;  [facility]: [facility];  Service: General;  Laterality: Bilateral;  . LAPAROSCOPIC INGUINAL HERNIA WITH UMBILICAL HERNIA Bilateral 10/30/2013   Procedure: laparoscopic repair left pantaloom  hernia with mesh, laparoscopic right inguinal hernia with mesh, OPEN REPAIR OF UMBILICAL HERNIA ;  Surgeon: Gayland Curry, MD;  [facility];  Service: General;  Laterality: Bilateral;  . LAPAROSCOPY N/A 07/26/2014   Procedure: LAPAROSCOPY DIAGNOSTIC;  Surgeon: Greer Pickerel, MD;  [facility];  Service: General;  Laterality: N/A;  . POLYPECTOMY  12/29/2015   Procedure: POLYPECTOMY;  Surgeon: Rogene Houston, MD;  [facility];  Service: Endoscopy;;  ascending colon, descending colon  . stent placement    . TOTAL HIP ARTHROPLASTY Right 05/10/2015   Procedure: RIGHT TOTAL HIP ARTHROPLASTY ANTERIOR APPROACH;  Surgeon: Paralee Cancel, MD;  [facility];  Service: Orthopedics;  Laterality: Right;  Marland Kitchen VASECTOMY      Social History   Tobacco Use  . Smoking status: Current Every Day Smoker    Packs/day: 1.00    Years: 45.00    Pack years: 45.00    Types: Cigarettes  . Smokeless tobacco: Never Used  Substance Use Topics  . Alcohol use: No    Comment: past hx of ETOH use was in inpt facility per court order  - 20 years, 10-18-2015 per pt not anymore,per pt stopped about 15-20 yrs ago    Family History  Problem Relation Age of Onset  . Coronary artery disease Father   . Depression Father   . Heart attack Father   . Depression Mother      Current medication list and allergy/intolerance information reviewed:    Current Outpatient Medications  Medication Sig Dispense Refill  . albuterol (PROVENTIL HFA;VENTOLIN HFA) 108 (90 Base) MCG/ACT inhaler Inhale 2 puffs into the lungs every 4 (four) hours as needed for wheezing or shortness of breath (AS NEEDED - RESCUE INHALER). 1 Inhaler 1  . aspirin EC 81 MG tablet Take 1 tablet (81 mg total) by mouth daily. 90 tablet 3  . atorvastatin (LIPITOR) 40 MG tablet Take 1 tablet (40 mg total) by mouth daily. 90 tablet 3  . baclofen (LIORESAL) 10 MG tablet Take 1 tablet (10 mg total) by mouth 2 (two) times daily. AS NEEDED FOR MUSCLE SPASM  60 tablet 2  . blood glucose meter kit and supplies KIT Dispense based on patient and insurance preference. Use to check BG once daily or every other other.  Dx: E11.9 1 each 0  . budesonide-formoterol (SYMBICORT) 80-4.5 MCG/ACT inhaler Inhale 2 puffs into the lungs 2 (two) times daily. EVERY DAY 1 Inhaler 5  . Cyanocobalamin (VITAMIN B-12 PO) Take 1,000 mg by mouth at bedtime.     . diclofenac (VOLTAREN) 75 MG EC tablet Take 1 tablet (75 mg total) by mouth 2 (two) times daily. 60 tablet 2  . fluticasone (FLONASE) 50 MCG/ACT nasal spray Place 2 sprays into both nostrils daily. 16 g 6  . gabapentin (NEURONTIN) 300 MG capsule Take 1 capsule (300 mg total) by mouth 3 (three) times daily. AS NEEDED FOR NERVE PAIN 180 capsule 3  . glipiZIDE (GLUCOTROL) 10 MG tablet Take 1 tablet (10 mg total) by mouth 2 (two) times daily before a meal. 60 tablet 3  . ipratropium-albuterol (DUONEB) 0.5-2.5 (3) MG/3ML  SOLN Take 3 mLs by nebulization every 2 (two) hours as needed (wheeze, cough, short of breath). 60 mL 0  . lisinopril (PRINIVIL,ZESTRIL) 5 MG tablet Take 1 tablet (5 mg total) by mouth daily. 90 tablet 3  . metFORMIN (GLUCOPHAGE-XR) 500 MG 24 hr tablet Take 2 tablets (1,000 mg total) by mouth daily with breakfast. 180 tablet 3  . metoprolol succinate (TOPROL-XL) 25 MG 24 hr tablet Take 0.5 tablets (12.5 mg total) by mouth daily. 90 tablet 0  . Nebulizers (NEBULIZER COMPRESSOR) MISC Per insurance coverage and patient preference. Dx COPD 1 each 1  . Respiratory Therapy Supplies (NEBULIZER/TUBING/MOUTHPIECE) KIT Per insurance coverage and patient preference. Dx COPD 1 each 35  . sennosides-docusate sodium (SENOKOT-S) 8.6-50 MG tablet Take 1 tablet by mouth 3 (three) times daily as needed for constipation.     . temazepam (RESTORIL) 15 MG capsule Take 1 capsule (15 mg total) by mouth at bedtime as needed for sleep. Sparing use to prevent dependence 30 capsule 2  . traMADol (ULTRAM) 50 MG tablet Take 1 tablet (50  mg total) by mouth daily as needed for up to 30 days for severe pain. 30 tablet 2   No current facility-administered medications for this visit.     Allergies  Allergen Reactions  . Flomax [Tamsulosin Hcl] Other (See Comments)    This medication makes patient feel sick  . Penicillins Other (See Comments)    Reaction unknown occurred during childhood Has patient had a PCN reaction causing immediate rash, facial/tongue/throat swelling, SOB or lightheadedness with hypotension: unsure - childhood reaction Has patient had a PCN reaction causing severe rash involving mucus membranes or skin necrosis: unsure - childhood reaction Has patient had a PCN reaction that required hospitalization no Has patient had a PCN reaction occurring within the last 10 years: no If all of the above answers are "NO", then may p      Review of Systems:  Constitutional:  No  fever, no chills  HEENT: No  headache,  Cardiac: No  chest pain, No  pressure  Respiratory:  No  shortness of breath. No  Cough  Gastrointestinal: No  abdominal pain, No  nausea  Musculoskeletal: No new myalgia/arthralgia  Endocrine: No cold intolerance,  No heat intolerance. No polyuria/polydipsia/polyphagia   Neurologic: No  weakness, No  dizziness  Psychiatric: No  concerns with depression, No  concerns with anxiety  Exam:  BP 113/65 (BP Location: Left Arm, Patient Position: Sitting, Cuff Size: Normal)   Pulse 68   Temp (!) 97.5 F (36.4 C) (Oral)   Wt 211 lb 3.2 oz (95.8 kg)   SpO2 94%   BMI 31.19 kg/m   Constitutional: VS see above. General Appearance: alert, well-developed, well-nourished, NAD  Eyes: Normal lids and conjunctive, non-icteric sclera  Ears, Nose, Mouth, Throat: MMM, Normal external inspection ears/nares/mouth/lips/gums.   Neck: No masses, trachea midline.  Respiratory: Normal respiratory effort. no wheeze, no rhonchi, no rales, diminished breath sounds bilaterally   Cardiovascular: S1/S2 normal,  no murmur, no rub/gallop auscultated. RRR.   Musculoskeletal: Gait normal.  Neurological: Normal balance/coordination. No tremor.   Psychiatric: Normal judgment/insight. Normal mood and affect. Oriented x3.    Results for orders placed or performed in visit on 04/15/18 (from the past 72 hour(s))  POCT HgB A1C     Status: Abnormal   Collection Time: 04/15/18 10:38 AM  Result Value Ref Range   Hemoglobin A1C 7.8 (A) 4.0 - 5.6 %   HbA1c POC (<> result, manual entry)  HbA1c, POC (prediabetic range)     HbA1c, POC (controlled diabetic range)      No results found.         ASSESSMENT/PLAN: The primary encounter diagnosis was Type 2 diabetes mellitus with hyperglycemia, without long-term current use of insulin ([place]). A diagnosis of Pulmonary emphysema, unspecified emphysema type ([place]) was also pertinent to this visit.   Orders Placed This Encounter  Procedures  . Ambulatory referral to Pulmonology  . POCT HgB A1C    Meds ordered this encounter  Medications  . traMADol (ULTRAM) 50 MG tablet    Sig: Take 1 tablet (50 mg total) by mouth daily as needed for up to 30 days for severe pain.    Dispense:  30 tablet    Refill:  2    Patient Instructions  Plan: Sugars better but still room for improvement, would recommend cutting back on carbohydrates (sugars, breads/pastas) and increasing exercise as tolerated.  I sent referral to lung doctor to see if there's anything else we can do to improve breathing.  I sent refill of Tramadol, you should be good for 3 months.           Visit summary with medication list and pertinent instructions was printed for patient to review. All questions at time of visit were answered - patient instructed to contact office with any additional concerns or updates. ER/RTC precautions were reviewed with the patient.     Please note: voice recognition software was used to produce this document, and typos may escape review. Please contact Dr.  Sheppard Coil for any needed clarifications.     Follow-up plan: Return in about 3 months (around 07/15/2018) for A1C recheck, sooner as needed .

## 2018-04-15 NOTE — Progress Notes (Deleted)
{Choose 1 Note Type (Telehealth Visit or Telephone Visit):[id number]}   Evaluation Performed:  Follow-up visit  Date:  04/15/2018   ID:  Anthony Form., DOB 1950-08-21, MRN [id number]  {Patient Location:[phone]::"Home"}  {Provider [phone]}  PCP:  Emeterio Reeve, DO  Cardiologist:  Lauree Chandler, MD *** Electrophysiologist:  None   Chief Complaint:  ***  History of Present Illness:    Anthony Bolander. is a 68 y.o. male who presents via audio/video conferencing for a telehealth visit today.    ***  The patient {does/does not:200015} have symptoms concerning for COVID-19 infection (fever, chills, cough, or new shortness of breath).    Past Medical History:  Diagnosis Date  . Anxiety   . Arthritis   . Bipolar disorder ([place])   . CAD (coronary artery disease)    a. INF STEMI 07/04/10:  tx with thrombectomy + [facility] to [facility];  cath 07/04/10: dLM 10-20%, pLAD 40-50%, mLAD 20-30%, pRCA 30%, mRCA occluded and tx with PCI, EF 50% with inf HK. A Multilink  . COPD (chronic obstructive pulmonary disease) ([place])   . Depression   . Glucose intolerance (impaired glucose tolerance)    A1c 6.2 06/2010  . History of DVT (deep vein thrombosis)    traumatic, s/p coumadin tx.  Marland Kitchen HLD (hyperlipidemia)   . Hypertension    pt states is currently on no medications  . Myocardial infarction ([place])    around 2013  . Shortness of breath    walking distance or climbing stairs  . Tobacco abuse   . Urinary frequency    Past Surgical History:  Procedure Laterality Date  . CARDIAC CATHETERIZATION    . COLONOSCOPY N/A 12/29/2015   Procedure: COLONOSCOPY;  Surgeon: Rogene Houston, MD;  [facility];  Service: Endoscopy;  Laterality: N/A;  12:55  . ELECTROCARDIOGRAM     Showed inferolateral ST-elevation and a code STEMI was activated. In the ER, he was treated with morphine, herarin, and 600 mg of Plavix. He was transferred emergently to [facility].   .  INGUINAL HERNIA REPAIR Left 07/26/2014   Procedure: OPEN REPAIR OF RECURENT LEFT INGUINAL HERNIA REPAIR WITH MESH;  Surgeon: Greer Pickerel, MD;  [facility];  Service: General;  Laterality: Left;  . INSERTION OF MESH Bilateral 10/30/2013   Procedure: INSERTION OF MESH;  Surgeon: Gayland Curry, MD;  [facility];  Service: General;  Laterality: Bilateral;  . LAPAROSCOPIC INGUINAL HERNIA WITH UMBILICAL HERNIA Bilateral 10/30/2013   Procedure: laparoscopic repair left pantaloom hernia with mesh, laparoscopic right inguinal hernia with mesh, OPEN REPAIR OF UMBILICAL HERNIA ;  Surgeon: Gayland Curry, MD;  [facility];  Service: General;  Laterality: Bilateral;  . LAPAROSCOPY N/A 07/26/2014   Procedure: LAPAROSCOPY DIAGNOSTIC;  Surgeon: Greer Pickerel, MD;  [facility];  Service: General;  Laterality: N/A;  . POLYPECTOMY  12/29/2015   Procedure: POLYPECTOMY;  Surgeon: Rogene Houston, MD;  [facility];  Service: Endoscopy;;  ascending colon, descending colon  . stent placement    . TOTAL HIP ARTHROPLASTY Right 05/10/2015   Procedure: RIGHT TOTAL HIP ARTHROPLASTY ANTERIOR APPROACH;  Surgeon: Paralee Cancel, MD;  [facility];  Service: Orthopedics;  Laterality: Right;  Marland Kitchen VASECTOMY       Current Meds  Medication Sig  . albuterol (PROVENTIL HFA;VENTOLIN HFA) 108 (90 Base) MCG/ACT inhaler Inhale 2 puffs into the lungs every 4 (four) hours as needed for wheezing or shortness of breath (  AS NEEDED - RESCUE INHALER).  Marland Kitchen aspirin EC 81 MG tablet Take 1 tablet (81 mg total) by mouth daily.  Marland Kitchen atorvastatin (LIPITOR) 40 MG tablet Take 1 tablet (40 mg total) by mouth daily.  . baclofen (LIORESAL) 10 MG tablet Take 1 tablet (10 mg total) by mouth 2 (two) times daily. AS NEEDED FOR MUSCLE SPASM  . blood glucose meter kit and supplies KIT Dispense based on patient and insurance preference. Use to check BG once daily or every other other.  Dx: E11.9  . budesonide-formoterol (SYMBICORT) 80-4.5  MCG/ACT inhaler Inhale 2 puffs into the lungs 2 (two) times daily. EVERY DAY  . Cyanocobalamin (VITAMIN B-12 PO) Take 1,000 mg by mouth at bedtime.   . diclofenac (VOLTAREN) 75 MG EC tablet Take 1 tablet (75 mg total) by mouth 2 (two) times daily.  . fluticasone (FLONASE) 50 MCG/ACT nasal spray Place 2 sprays into both nostrils daily.  Marland Kitchen gabapentin (NEURONTIN) 300 MG capsule Take 1 capsule (300 mg total) by mouth 3 (three) times daily. AS NEEDED FOR NERVE PAIN  . glipiZIDE (GLUCOTROL) 10 MG tablet Take 1 tablet (10 mg total) by mouth 2 (two) times daily before a meal.  . ipratropium-albuterol (DUONEB) 0.5-2.5 (3) MG/3ML SOLN Take 3 mLs by nebulization every 2 (two) hours as needed (wheeze, cough, short of breath).  Marland Kitchen lisinopril (PRINIVIL,ZESTRIL) 5 MG tablet Take 1 tablet (5 mg total) by mouth daily.  . metFORMIN (GLUCOPHAGE-XR) 500 MG 24 hr tablet Take 2 tablets (1,000 mg total) by mouth daily with breakfast.  . metoprolol succinate (TOPROL-XL) 25 MG 24 hr tablet Take 0.5 tablets (12.5 mg total) by mouth daily.  . Nebulizers (NEBULIZER COMPRESSOR) MISC Per insurance coverage and patient preference. Dx COPD  . Respiratory Therapy Supplies (NEBULIZER/TUBING/MOUTHPIECE) KIT Per insurance coverage and patient preference. Dx COPD  . sennosides-docusate sodium (SENOKOT-S) 8.6-50 MG tablet Take 1 tablet by mouth 3 (three) times daily as needed for constipation.   . temazepam (RESTORIL) 15 MG capsule Take 1 capsule (15 mg total) by mouth at bedtime as needed for sleep. Sparing use to prevent dependence  . traMADol (ULTRAM) 50 MG tablet Take 1 tablet (50 mg total) by mouth daily as needed for up to 30 days for severe pain.     Allergies:   Flomax [tamsulosin hcl] and Penicillins   Social History   Tobacco Use  . Smoking status: Current Every Day Smoker    Packs/day: 1.00    Years: 45.00    Pack years: 45.00    Types: Cigarettes  . Smokeless tobacco: Never Used  Substance Use Topics  . Alcohol  use: No    Comment: past hx of ETOH use was in inpt facility per court order  - 20 years, 10-18-2015 per pt not anymore,per pt stopped about 15-20 yrs ago  . Drug use: No    Types: Marijuana, Heroin, Cocaine    Comment: snorted heroin and cocaine, 10-18-2015 per pt in the past he did Marijuana only and nothing else     Family Hx: The patient's family history includes Coronary artery disease in his father; Depression in his father and mother; Heart attack in his father.  ROS:   Please see the history of present illness.    *** All other systems reviewed and are negative.   Prior CV studies:   The following studies were reviewed today:  ***  Labs/Other Tests and Data Reviewed:    EKG:  {OBS:[id number]}  Recent Labs: 05/13/2017: BUN 13; Creatinine,  Ser 1.00; Magnesium 2.1; Potassium 4.5; Sodium 141 06/04/2017: TSH 1.650   Recent Lipid Panel Lab Results  Component Value Date/Time   CHOL 239 (H) 02/15/2017 04:17 PM   TRIG 336 (H) 02/15/2017 04:17 PM   HDL 47 02/15/2017 04:17 PM   CHOLHDL 5.1 (H) 02/15/2017 04:17 PM   CHOLHDL 6.3 (H) 01/03/2016 09:14 AM   LDLCALC 125 (H) 02/15/2017 04:17 PM    Wt Readings from Last 3 Encounters:  04/15/18 211 lb 4.8 oz (95.8 kg)  04/15/18 211 lb 3.2 oz (95.8 kg)  03/25/18 213 lb (96.6 kg)     Objective:    Vital Signs:  BP 113/65 (BP Location: Left Arm, Patient Position: Sitting, Cuff Size: Normal)   Pulse 68   Ht 5' 9"  (1.753 m)   Wt 211 lb 4.8 oz (95.8 kg)   SpO2 95%   BMI 31.20 kg/m    Well nourished, well developed male in no*** acute distress. ***  ASSESSMENT & PLAN:    1. ***  COVID-19 Education: The signs and symptoms of COVID-19 were discussed with the patient and how to seek care for testing (follow up with PCP or arrange E-visit).  ***The importance of social distancing was discussed today.  Time:   Today, I have spent *** minutes with the patient with telehealth technology discussing the above problems.      Medication Adjustments/Labs and Tests Ordered: Current medicines are reviewed at length with the patient today.  Concerns regarding medicines are outlined above.  Tests Ordered: No orders of the defined types were placed in this encounter.  Medication Changes: No orders of the defined types were placed in this encounter.   Disposition:  Follow up {follow up:15908}  Signed, Ermalinda Barrios, PA-C  04/15/2018 3:37 PM    [facility]

## 2018-04-15 NOTE — Progress Notes (Signed)
Virtual Visit via Telephone Note   This visit type was conducted due to national recommendations for restrictions regarding the COVID-19 Pandemic (e.g. social distancing) in an effort to limit this patient's exposure and mitigate transmission in our community.  Due to his co-morbid illnesses, this patient is at least at moderate risk for complications without adequate follow up.  This format is felt to be most appropriate for this patient at this time.  The patient did not have access to video technology/had technical difficulties with video requiring transitioning to audio format only (telephone).  All issues noted in this document were discussed and addressed.  No physical exam could be performed with this format.  Please refer to the patient's chart for his  consent to telehealth for [facility].   Evaluation Performed:  Follow-up visit  Date:  04/15/2018   ID:  [name], DOB 1950-03-27, MRN [id number]  Patient Location: Home  Provider Location: Home  PCP:  Emeterio Reeve, DO  Cardiologist:  Lauree Chandler, MD  Electrophysiologist:  None   Chief Complaint:  Follow up  History of Present Illness:    Ezel Vallone. is a 68 y.o. male who presents via audio/video conferencing for a telehealth visit today.    This is a patient with a history of CAD status post inferior STEMI 06/2010 treated with BMS to the mid RCA.  He had moderate nonobstructive disease in the LAD, inferior hypokinesis EF 50%.  Has a history of recurrent DVT in 2008 & 2014 treated with Xarelto, COPD, tobacco abuse, HLD, bipolar disorder.  Patient last saw Dr. [name] 02/25/2017 at which time he was doing well.  Patient was initially a video visit and I was able to do a limited exam and converted to phone visit. Denies chest pain. Has had trouble with leg pain better with gabapentin and COPD exacerbation so he's been limited to his exercise. Still smoking over 1ppd. Says he chain smokes and just  can't put them down.Walking 15 min 3 times a day.Denies dyspnea, edema, dizziness or presyncope.  The patient does not have symptoms concerning for COVID-19 infection (fever, chills, cough, or new shortness of breath).    Past Medical History:  Diagnosis Date  . Anxiety   . Arthritis   . Bipolar disorder ([place])   . CAD (coronary artery disease)    a. INF STEMI 07/04/10:  tx with thrombectomy + Vision BMS to [facility];  cath 07/04/10: dLM 10-20%, pLAD 40-50%, mLAD 20-30%, pRCA 30%, mRCA occluded and tx with PCI, EF 50% with inf HK. A Multilink  . COPD (chronic obstructive pulmonary disease) ([place])   . Depression   . Glucose intolerance (impaired glucose tolerance)    A1c 6.2 06/2010  . History of DVT (deep vein thrombosis)    traumatic, s/p coumadin tx.  Marland Kitchen HLD (hyperlipidemia)   . Hypertension    pt states is currently on no medications  . Myocardial infarction ([place])    around 2013  . Shortness of breath    walking distance or climbing stairs  . Tobacco abuse   . Urinary frequency    Past Surgical History:  Procedure Laterality Date  . CARDIAC CATHETERIZATION    . COLONOSCOPY N/A 12/29/2015   Procedure: COLONOSCOPY;  Surgeon: Rogene Houston, MD;  Location[facility];  Service: Endoscopy;  Laterality: N/A;  12:55  . ELECTROCARDIOGRAM     Showed inferolateral ST-elevation and a code STEMI was activated. In the ER, he was treated with  morphine, herarin, and 600 mg of Plavix. He was transferred emergently to [facility].   . INGUINAL HERNIA REPAIR Left 07/26/2014   Procedure: OPEN REPAIR OF RECURENT LEFT INGUINAL HERNIA REPAIR WITH MESH;  Surgeon: Greer Pickerel, MD;  Location: [facility] ORS;  Service: General;  Laterality: Left;  . INSERTION OF MESH Bilateral 10/30/2013   Procedure: INSERTION OF MESH;  Surgeon: Gayland Curry, MD;  Location: [facility]S;  Service: General;  Laterality: Bilateral;  . LAPAROSCOPIC INGUINAL HERNIA WITH UMBILICAL HERNIA Bilateral 10/30/2013   Procedure:  laparoscopic repair left pantaloom hernia with mesh, laparoscopic right inguinal hernia with mesh, OPEN REPAIR OF UMBILICAL HERNIA ;  Surgeon: Gayland Curry, MD;  Location: [facility];  Service: General;  Laterality: Bilateral;  . LAPAROSCOPY N/A 07/26/2014   Procedure: LAPAROSCOPY DIAGNOSTIC;  Surgeon: Greer Pickerel, MD;  Location: [facility];  Service: General;  Laterality: N/A;  . POLYPECTOMY  12/29/2015   Procedure: POLYPECTOMY;  Surgeon: Rogene Houston, MD;  Location[facility];  Service: Endoscopy;;  ascending colon, descending colon  . stent placement    . TOTAL HIP ARTHROPLASTY Right 05/10/2015   Procedure: RIGHT TOTAL HIP ARTHROPLASTY ANTERIOR APPROACH;  Surgeon: Paralee Cancel, MD;  Location: [facility];  Service: Orthopedics;  Laterality: Right;  Marland Kitchen VASECTOMY       Current Meds  Medication Sig  . albuterol (PROVENTIL HFA;VENTOLIN HFA) 108 (90 Base) MCG/ACT inhaler Inhale 2 puffs into the lungs every 4 (four) hours as needed for wheezing or shortness of breath (AS NEEDED - RESCUE INHALER).  Marland Kitchen aspirin EC 81 MG tablet Take 1 tablet (81 mg total) by mouth daily.  Marland Kitchen atorvastatin (LIPITOR) 40 MG tablet Take 1 tablet (40 mg total) by mouth daily.  . baclofen (LIORESAL) 10 MG tablet Take 1 tablet (10 mg total) by mouth 2 (two) times daily. AS NEEDED FOR MUSCLE SPASM  . blood glucose meter kit and supplies KIT Dispense based on patient and insurance preference. Use to check BG once daily or every other other.  Dx: E11.9  . budesonide-formoterol (SYMBICORT) 80-4.5 MCG/ACT inhaler Inhale 2 puffs into the lungs 2 (two) times daily. EVERY DAY  . Cyanocobalamin (VITAMIN B-12 PO) Take 1,000 mg by mouth at bedtime.   . diclofenac (VOLTAREN) 75 MG EC tablet Take 1 tablet (75 mg total) by mouth 2 (two) times daily.  . fluticasone (FLONASE) 50 MCG/ACT nasal spray Place 2 sprays into both nostrils daily.  Marland Kitchen gabapentin (NEURONTIN) 300 MG capsule Take 1 capsule (300 mg total) by mouth 3 (three) times daily. AS NEEDED  FOR NERVE PAIN  . glipiZIDE (GLUCOTROL) 10 MG tablet Take 1 tablet (10 mg total) by mouth 2 (two) times daily before a meal.  . ipratropium-albuterol (DUONEB) 0.5-2.5 (3) MG/3ML SOLN Take 3 mLs by nebulization every 2 (two) hours as needed (wheeze, cough, short of breath).  Marland Kitchen lisinopril (PRINIVIL,ZESTRIL) 5 MG tablet Take 1 tablet (5 mg total) by mouth daily.  . metFORMIN (GLUCOPHAGE-XR) 500 MG 24 hr tablet Take 2 tablets (1,000 mg total) by mouth daily with breakfast.  . metoprolol succinate (TOPROL-XL) 25 MG 24 hr tablet Take 0.5 tablets (12.5 mg total) by mouth daily.  . Nebulizers (NEBULIZER COMPRESSOR) MISC Per insurance coverage and patient preference. Dx COPD  . Respiratory Therapy Supplies (NEBULIZER/TUBING/MOUTHPIECE) KIT Per insurance coverage and patient preference. Dx COPD  . sennosides-docusate sodium (SENOKOT-S) 8.6-50 MG tablet Take 1 tablet by mouth 3 (three) times daily as needed for constipation.   Marland Kitchen  temazepam (RESTORIL) 15 MG capsule Take 1 capsule (15 mg total) by mouth at bedtime as needed for sleep. Sparing use to prevent dependence  . traMADol (ULTRAM) 50 MG tablet Take 1 tablet (50 mg total) by mouth daily as needed for up to 30 days for severe pain.     Allergies:   Flomax [tamsulosin hcl] and Penicillins   Social History   Tobacco Use  . Smoking status: Current Every Day Smoker    Packs/day: 1.00    Years: 45.00    Pack years: 45.00    Types: Cigarettes  . Smokeless tobacco: Never Used  Substance Use Topics  . Alcohol use: No    Comment: past hx of ETOH use was in inpt facility per court order  - 20 years, 10-18-2015 per pt not anymore,per pt stopped about 15-20 yrs ago  . Drug use: No    Types: Marijuana, Heroin, Cocaine    Comment: snorted heroin and cocaine, 10-18-2015 per pt in the past he did Marijuana only and nothing else     Family Hx: The patient's family history includes Coronary artery disease in his father; Depression in his father and mother;  Heart attack in his father.  ROS:   Please see the history of present illness.    Review of Systems  Cardiovascular: Positive for dyspnea on exertion.  Musculoskeletal: Positive for arthritis and joint pain.       Leg pain    All other systems reviewed and are negative.   Prior CV studies:   The following studies were reviewed today: CT chest 02/11/18 IMPRESSION: 1. There is moderate emphysema, predominantly in a paraseptal pattern. At the bilateral lung bases, there is subtle peripheral interstitial opacity and bronchiolectasis. 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 There is no acute appearing airspace opacity.   2. Multiple small bilateral pulmonary nodules measuring up to 6 mm. Recommend initial follow-up CT at 3-6 months and subsequently at 18-24 months to establish stability.   3.  Coronary artery disease.     Electronically Signed   By: Eddie Candle M.D.   On: 02/11/2018 15:18   Cardiovascular: Three-vessel coronary artery calcifications. Normal heart size. No pericardial effusion.   Cardiac cath 6/2012IMPRESSION: 1. Acute ST-segment elevation inferior wall myocardial infarction     secondary to total occlusion of the mid right coronary artery. 2. Transient bradycardia to heart rate down to 38 requiring temporary     pacemaker insertion. 3. Hypotension requiring inotropic support with dopamine. 4. Mild 10-20% distal tapering of the left main, with 40-50% proximal     left anterior descending stenoses and luminal irregularities of 20-     30% in the mid segment of the left anterior descending with initial     left-to-right collateralization to the posterolateral artery branch     of the RCA. 5. Normal left circumflex coronary artery. 6. Total occlusion of the mid right coronary artery with initial TIMI     0 flow. 7. Successful percutaneous coronary intervention requiring initial     thrombectomy, percutaneous transluminal coronary  angioplasty and     ultimate stenting with a 3.5 x 18-mm bare-metal Vision stent,     postdilated 3.91 mm. 8. Because of haziness and clot during the procedure, the patient was     also started on Integrilin in addition to bivalirudin and had     received 600 mg oral Plavix prior to transfer to [facility]  TIME:  39 minutes.       Labs/Other Tests and Data Reviewed:    EKG:  An ECG dated 02/25/17 was personally reviewed today and demonstrated:  NSR with inferior Q waves, no acute change  Recent Labs: 05/13/2017: BUN 13; Creatinine, Ser 1.00; Magnesium 2.1; Potassium 4.5; Sodium 141 06/04/2017: TSH 1.650   Recent Lipid Panel Lab Results  Component Value Date/Time   CHOL 239 (H) 02/15/2017 04:17 PM   TRIG 336 (H) 02/15/2017 04:17 PM   HDL 47 02/15/2017 04:17 PM   CHOLHDL 5.1 (H) 02/15/2017 04:17 PM   CHOLHDL 6.3 (H) 01/03/2016 09:14 AM   LDLCALC 125 (H) 02/15/2017 04:17 PM    Wt Readings from Last 3 Encounters:  04/15/18 211 lb 4.8 oz (95.8 kg)  04/15/18 211 lb 3.2 oz (95.8 kg)  03/25/18 213 lb (96.6 kg)     Objective:    Vital Signs:  BP 113/65 (BP Location: Left Arm, Patient Position: Sitting, Cuff Size: Normal)   Pulse 68   Ht _0  (1.753 m)   Wt 211 lb 4.8 oz (95.8 kg)   SpO2 95%   BMI 31.20 kg/m    Well nourished, well developed male in no  acute distress. No JVD or edema  ASSESSMENT & PLAN:    1. CAD S/P inf STEMI 2012 BMS RCA with moderateate non-obstructive disease in LAD- no angina. Continue ASA & Lipitor 2. Essential Htn well controlled 3. Hyperlipidemia LDL 125 03/2017 but wasn't taking his statin-needs FLP this summer. Would like to have it drawn at [facility] 4. Tobacco abuse- smoking cessation discussed with patient  5. Diabetes Hbg A1C 7.8 today down from over 9    COVID-19 Education: The signs and symptoms of COVID-19 were discussed with the patient and how to seek care for testing (follow  up with PCP or arrange E-visit).  The importance of social distancing was discussed today.  Time:   Today, I have spent 15 minutes with the patient with telehealth technology discussing the above problems. -time was split between video and telephone visit as patient had trouble with the audio on his video. I was able to examine him and then converted to phone visit.    Medication Adjustments/Labs and Tests Ordered: Current medicines are reviewed at length with the patient today.  Concerns regarding medicines are outlined above.  Tests Ordered: No orders of the defined types were placed in this encounter.  Medication Changes: No orders of the defined types were placed in this encounter.   Disposition:  Follow up in 1 year(s) Dr. [name]  Signed, Ermalinda Barrios, PA-C  04/15/2018 3:13 PM    [place]

## 2018-04-15 NOTE — Patient Instructions (Signed)
Medication Instructions:  Your physician recommends that you continue on your current medications as directed. Please refer to the Current Medication list given to you today.  If you need a refill on your cardiac medications before your next appointment, please call your pharmacy.   Lab work: Theatre stage manager have your labs (LIPIDS, CMET, CBC) drawn at [facility] in July and have them fax the results to [place] at [phone]  If you have labs (blood work) drawn today and your tests are completely normal, you will receive your results only by: Marland Kitchen MyChart Message (if you have MyChart) OR . A paper copy in the mail If you have any lab test that is abnormal or we need to change your treatment, we will call you to review the results.  Testing/Procedures: None ordered  Follow-Up: At [facility], you and your health needs are our priority.  As part of our continuing mission to provide you with exceptional heart care, we have created designated Provider Care Teams.  These Care Teams include your primary Cardiologist (physician) and Advanced Practice Providers (APPs -  Physician Assistants and Nurse Practitioners) who all work together to provide you with the care you need, when you need it. . You will need a follow up appointment in 9-12 months.  Please call our office 2 months in advance to schedule this appointment.  You may see Darlina Guys, MD or one of the following Advanced Practice Providers on your designated Care Team:   . Lyda Jester, PA-C . Dayna Dunn, PA-C . Ermalinda Barrios, PA-C  Any Other Special Instructions Will Be Listed Below (If Applicable).

## 2018-04-22 ENCOUNTER — Ambulatory Visit (INDEPENDENT_AMBULATORY_CARE_PROVIDER_SITE_OTHER): Payer: Medicare Other | Admitting: Osteopathic Medicine

## 2018-04-22 ENCOUNTER — Encounter: Payer: Self-pay | Admitting: Osteopathic Medicine

## 2018-04-22 VITALS — BP 136/68 | HR 65 | Temp 98.2°F | Wt 213.3 lb

## 2018-04-22 DIAGNOSIS — R269 Unspecified abnormalities of gait and mobility: Secondary | ICD-10-CM | POA: Diagnosis not present

## 2018-04-22 DIAGNOSIS — R251 Tremor, unspecified: Secondary | ICD-10-CM

## 2018-04-22 NOTE — Progress Notes (Signed)
HPI: Anthony [name]. is a 68 y.o. male who  has a past medical history of Anxiety, Arthritis, Bipolar disorder ([place]), CAD (coronary artery disease), COPD (chronic obstructive pulmonary disease) ([place]), Depression, Glucose intolerance (impaired glucose tolerance), History of DVT (deep vein thrombosis), HLD (hyperlipidemia), Hypertension, Myocardial infarction ([place]), Shortness of breath, Tobacco abuse, and Urinary frequency.  he presents to [facility] today, 04/22/18,  for chief complaint of:  "Dizziness"  Doesn't really describe as dizziness, he is intermittently having episodes lasting a few mins to few hours which he describes as feeling unsteady on his feet but not really lightheaded and no spinning sensation. No headaches or vision changes. Happens regardless of activity or position (no orthostatic symptoms), rest seems to help. Reports tremor on occasion, independent of these other spells.   Coughing fits are better, maybe only twice per day. Still smoking but has cut back from 1.5 ppd to 1 ppd       At today's visit 04/22/18 ... PMH, PSH, FH reviewed and updated as needed.  Current medication list and allergy/intolerance hx reviewed and updated as needed. (See remainder of HPI, ROS, Phys Exam below)   No results found.  No results found for this or any previous visit (from the past 72 hour(s)).   BP 136/68 (BP Location: Left Arm, Patient Position: Sitting, Cuff Size: Normal)   Pulse 65   Temp 98.2 F (36.8 C) (Oral)   Wt 213 lb 4.8 oz (96.8 kg)   SpO2 93%   BMI 31.50 kg/m   Orthostatics negative  No data found.        ASSESSMENT/PLAN: The primary encounter diagnosis was Gait disturbance. A diagnosis of Tremor was also pertinent to this visit. No resting tremor, no intention tremor on exam today, suspect possible cerebellar issue or other movement disorder. Advised MRI and neuro referral, he'd like to hold off on MRI due to cost  unless things get worse. Would like to speak with neurology first. ER/RTC precautions reviewed, I think ok to wait on MRI for now.   Orders Placed This Encounter  Procedures  . Ambulatory referral to Neurology         Follow-up plan: Return if symptoms worsen or fail to improve.                                                 ################################################# ################################################# ################################################# #################################################    Current Meds  Medication Sig  . albuterol (PROVENTIL HFA;VENTOLIN HFA) 108 (90 Base) MCG/ACT inhaler Inhale 2 puffs into the lungs every 4 (four) hours as needed for wheezing or shortness of breath (AS NEEDED - RESCUE INHALER).  Marland Kitchen aspirin EC 81 MG tablet Take 1 tablet (81 mg total) by mouth daily.  Marland Kitchen atorvastatin (LIPITOR) 40 MG tablet Take 1 tablet (40 mg total) by mouth daily.  . baclofen (LIORESAL) 10 MG tablet Take 1 tablet (10 mg total) by mouth 2 (two) times daily. AS NEEDED FOR MUSCLE SPASM  . blood glucose meter kit and supplies KIT Dispense based on patient and insurance preference. Use to check BG once daily or every other other.  Dx: E11.9  . budesonide-formoterol (SYMBICORT) 80-4.5 MCG/ACT inhaler Inhale 2 puffs into the lungs 2 (two) times daily. EVERY DAY  . Cyanocobalamin (VITAMIN B-12 PO) Take 1,000 mg by mouth at bedtime.   Marland Kitchen  diclofenac (VOLTAREN) 75 MG EC tablet Take 1 tablet (75 mg total) by mouth 2 (two) times daily.  . fluticasone (FLONASE) 50 MCG/ACT nasal spray Place 2 sprays into both nostrils daily.  Marland Kitchen gabapentin (NEURONTIN) 300 MG capsule Take 1 capsule (300 mg total) by mouth 3 (three) times daily. AS NEEDED FOR NERVE PAIN  . glipiZIDE (GLUCOTROL) 10 MG tablet Take 1 tablet (10 mg total) by mouth 2 (two) times daily before a meal.  . ipratropium-albuterol (DUONEB) 0.5-2.5 (3) MG/3ML SOLN  Take 3 mLs by nebulization every 2 (two) hours as needed (wheeze, cough, short of breath).  Marland Kitchen lisinopril (PRINIVIL,ZESTRIL) 5 MG tablet Take 1 tablet (5 mg total) by mouth daily.  . metFORMIN (GLUCOPHAGE-XR) 500 MG 24 hr tablet Take 2 tablets (1,000 mg total) by mouth daily with breakfast.  . metoprolol succinate (TOPROL-XL) 25 MG 24 hr tablet Take 0.5 tablets (12.5 mg total) by mouth daily.  . Nebulizers (NEBULIZER COMPRESSOR) MISC Per insurance coverage and patient preference. Dx COPD  . Respiratory Therapy Supplies (NEBULIZER/TUBING/MOUTHPIECE) KIT Per insurance coverage and patient preference. Dx COPD  . sennosides-docusate sodium (SENOKOT-S) 8.6-50 MG tablet Take 1 tablet by mouth 3 (three) times daily as needed for constipation.   . temazepam (RESTORIL) 15 MG capsule Take 1 capsule (15 mg total) by mouth at bedtime as needed for sleep. Sparing use to prevent dependence  . traMADol (ULTRAM) 50 MG tablet Take 1 tablet (50 mg total) by mouth daily as needed for up to 30 days for severe pain.    Allergies  Allergen Reactions  . Flomax [Tamsulosin Hcl] Other (See Comments)    This medication makes patient feel sick  . Penicillins Other (See Comments)    Reaction unknown occurred during childhood Has patient had a PCN reaction causing immediate rash, facial/tongue/throat swelling, SOB or lightheadedness with hypotension: unsure - childhood reaction Has patient had a PCN reaction causing severe rash involving mucus membranes or skin necrosis: unsure - childhood reaction Has patient had a PCN reaction that required hospitalization no Has patient had a PCN reaction occurring within the last 10 years: no If all of the above answers are "NO", then may p       Review of Systems:  Constitutional: No recent illness  HEENT: No  headache, no vision change  Cardiac: No  chest pain, No  pressure, No palpitations  Respiratory:  No  shortness of breath. No  Cough  Gastrointestinal: No   abdominal pain, no change on bowel habits  Musculoskeletal: No new myalgia/arthralgia  Skin: No  Rash  Hem/Onc: No  easy bruising/bleeding, No  abnormal lumps/bumps  Neurologic: No  weakness, No  Dizziness  Psychiatric: No  concerns with depression, No  concerns with anxiety  Exam:  BP 136/68 (BP Location: Left Arm, Patient Position: Sitting, Cuff Size: Normal)   Pulse 65   Temp 98.2 F (36.8 C) (Oral)   Wt 213 lb 4.8 oz (96.8 kg)   SpO2 93%   BMI 31.50 kg/m   Constitutional: VS see above. General Appearance: alert, well-developed, well-nourished, NAD  Eyes: Normal lids and conjunctive, non-icteric sclera  Ears, Nose, Mouth, Throat: MMM, Normal external inspection ears/nares/mouth/lips/gums.  Neck: No masses, trachea midline.   Respiratory: Normal respiratory effort. no wheeze, no rhonchi, no rales  Cardiovascular: S1/S2 normal, no murmur, no rub/gallop auscultated. RRR.   Musculoskeletal: Gait normal. Symmetric and independent movement of all extremities  Abdominal: non-tender, non-distended, no appreciable organomegaly, neg Murphy's, BS WNLx4  Neurological: Normal balance/coordination. Minimal  essential tremor, does not worsen w/ finger to nose or w/ gripping. EOMI, PERRL, no nystagmus. Strength and sensation normal in all extremities and symmetric.   Skin: warm, dry, intact.   Psychiatric: Normal judgment/insight. Normal mood and affect. Oriented x3.       Visit summary with medication list and pertinent instructions was printed for patient to review, patient was advised to alert [place] if any updates are needed. All questions at time of visit were answered - patient instructed to contact office with any additional concerns. ER/RTC precautions were reviewed with the patient and understanding verbalized.   Note: Total time spent 25 minutes, greater than 50% of the visit was spent face-to-face counseling and coordinating care for the following: The primary encounter  diagnosis was Gait disturbance. A diagnosis of Tremor was also pertinent to this visit.Marland Kitchen  Please note: voice recognition software was used to produce this document, and typos may escape review. Please contact Dr. Sheppard Coil for any needed clarifications.    Follow up plan: Return if symptoms worsen or fail to improve.

## 2018-04-23 ENCOUNTER — Encounter: Payer: Self-pay | Admitting: Osteopathic Medicine

## 2018-04-23 ENCOUNTER — Other Ambulatory Visit: Payer: Self-pay

## 2018-04-23 NOTE — Patient Outreach (Signed)
[facility]) Care Management  04/23/2018  Anthony [name]. 1950/03/25 [id number]   Medication Adherence call to [place] Compliant Voice message left with a call back number. Anthony [name] is showing past due on Glipizide under [place].   [place] Management Direct Dial [phone]  Fax [phone] [email or web address]

## 2018-04-24 ENCOUNTER — Encounter: Payer: Self-pay | Admitting: Osteopathic Medicine

## 2018-04-30 ENCOUNTER — Telehealth: Payer: Self-pay

## 2018-04-30 NOTE — Telephone Encounter (Signed)
I called pt. Spent over 15 minutes on the phone with him updating his chart. Pt reports that he has had some trouble walking. He denies any falls. Pt reports that he does not have any tremors.  Pt's meds, allergies, and PMH were updated.  Pt did receive the email from [place] but does not know how to use his [employer] phone. I tried by best to help him troubleshoot downloading the software on his phone but am unable to make any progress. I will ask Lovena Le to also reach out and attempt to help him.

## 2018-04-30 NOTE — Telephone Encounter (Signed)
I called patient and attempted to help him download the [employer]. I spent 15 minutes with him and tried to troubleshoot with him as best as I could. Patient was very unfamiliar with his phone and was unable to locate the app store on his device after all of my recommendations. Patient agreed to reschedule this appointment to an in-office visit. Patient was scheduled for June.

## 2018-05-06 ENCOUNTER — Ambulatory Visit: Payer: Self-pay | Admitting: Neurology

## 2018-05-09 ENCOUNTER — Other Ambulatory Visit: Payer: Self-pay

## 2018-05-09 NOTE — Patient Outreach (Signed)
[facility] Care Management  05/09/2018  Anthony [name]. January 23, 1950 [id number]   Medication Adherence call to Anthony [name] spoke with patient he is due on Glipizide 10 mg he explain he received a 3 month supply from [facility] but was only bill for a month supply patient ask [facility] to fixed it but they refuse to do it. Patient received 3 bottles on March 16,2020 he has enough until June/2020.Anthony [name] is showing past due under [place].   [facility] Management Direct Dial [phone]  Fax [phone] [name]

## 2018-05-14 ENCOUNTER — Other Ambulatory Visit: Payer: Self-pay | Admitting: Osteopathic Medicine

## 2018-05-14 DIAGNOSIS — M791 Myalgia, unspecified site: Secondary | ICD-10-CM

## 2018-05-19 ENCOUNTER — Other Ambulatory Visit: Payer: Self-pay

## 2018-05-19 DIAGNOSIS — E1165 Type 2 diabetes mellitus with hyperglycemia: Secondary | ICD-10-CM

## 2018-05-19 MED ORDER — METFORMIN HCL ER 500 MG PO TB24: 1000.0000 mg | ORAL_TABLET | Freq: Every day | ORAL | 1 refills | Status: DC

## 2018-05-19 NOTE — Telephone Encounter (Signed)
Pt requesting RX for Metformin be sent to mail order, [name]

## 2018-05-20 ENCOUNTER — Other Ambulatory Visit: Payer: Self-pay

## 2018-05-20 DIAGNOSIS — E1165 Type 2 diabetes mellitus with hyperglycemia: Secondary | ICD-10-CM

## 2018-05-20 MED ORDER — METFORMIN HCL ER 500 MG PO TB24: 1000.0000 mg | ORAL_TABLET | Freq: Every day | ORAL | 0 refills | Status: DC

## 2018-05-20 NOTE — Progress Notes (Signed)
Pt called clinic stating his metformin won't come until next week - requesting short term Rx be sent to [facility].   Pt also questions something to help him stop smoking. Routing to PCP

## 2018-05-21 ENCOUNTER — Ambulatory Visit: Payer: Self-pay | Admitting: Sports Medicine

## 2018-05-21 NOTE — Progress Notes (Signed)
Thanks for sending the metformin!   Can we ask if he'd like to restart the Wellbutrin/bupropion or if he'd like an Rx for Chantix?

## 2018-05-21 NOTE — Progress Notes (Signed)
Pt has tried Wellbutrin in the past. Only stipulation is cost, will look to see if Chantix is on [facility]

## 2018-05-22 MED ORDER — VARENICLINE TARTRATE 1 MG PO TABS: 1.0000 mg | ORAL_TABLET | Freq: Two times a day (BID) | ORAL | 1 refills | Status: DC

## 2018-05-22 MED ORDER — VARENICLINE TARTRATE 0.5 MG X 11 & 1 MG X 42 PO MISC: ORAL | 0 refills | Status: DC

## 2018-05-22 NOTE — Progress Notes (Signed)
Pt advised. He will see how much the Chantix is. If its too expensive he would like to try the Wellbutrin again. He will call us back with an update

## 2018-05-22 NOTE — Progress Notes (Signed)
OK, sent to pharmacy on file

## 2018-05-22 NOTE — Progress Notes (Signed)
Chantix is not cheap on [facility]. Can try sending Rx to pharmacy and see what OOP is.

## 2018-05-23 ENCOUNTER — Telehealth: Payer: Self-pay

## 2018-05-23 NOTE — Telephone Encounter (Signed)
Pt advised. Will contact us with any further needs

## 2018-05-23 NOTE — Telephone Encounter (Signed)
Pt calls to report that Chantix is going to be $40something for the starter pack and $140something for the monthly RX.  Willing to re-try Wellbutrin. States he already has some extended release 150 and 300 mg tablets at home if he can just be advised how to take

## 2018-05-23 NOTE — Telephone Encounter (Signed)
Can take the 150 mg daily for a week then increase to 300 mg daily, let me know if/when refills are needed

## 2018-05-26 ENCOUNTER — Telehealth: Payer: Self-pay

## 2018-05-26 NOTE — Telephone Encounter (Addendum)
[name] pharmacist left a vm msg.  Requesting clarification on max daily dose or vials to be used per day for Duoneb. As per pharmacist, unable to accept faxed med clarification order sent on 05/22/18. Needs to know how many vials/day and/or max daily dose for treatment. A #90 supply of Duoneb with the current sig equals to 18 boxes of Duoneb to be mailed to pt. Pls verify. Ref # [id number].

## 2018-05-27 MED ORDER — IPRATROPIUM-ALBUTEROL 0.5-2.5 (3) MG/3ML IN SOLN: 3.0000 mL | RESPIRATORY_TRACT | 99 refills | Status: DC | PRN

## 2018-05-27 NOTE — Telephone Encounter (Signed)
Noted  

## 2018-05-27 NOTE — Telephone Encounter (Signed)
Updated rx sent

## 2018-06-19 NOTE — Telephone Encounter (Signed)
Due to current COVID 19 pandemic, our office is severely reducing in office visits until further notice, in order to minimize the risk to our patients and healthcare providers.   Called patient and went over our precautions for in office visits. Patient verbalized understanding. I also gave patient directions to our office. Patient understands that upon arrival he will need to wait for direction from staff member and to park and wait by entrance.

## 2018-06-25 ENCOUNTER — Ambulatory Visit: Payer: Self-pay | Admitting: Neurology

## 2018-06-26 ENCOUNTER — Ambulatory Visit: Payer: Medicare Other | Admitting: Neurology

## 2018-06-26 ENCOUNTER — Telehealth: Payer: Self-pay

## 2018-06-26 ENCOUNTER — Other Ambulatory Visit: Payer: Self-pay

## 2018-06-26 DIAGNOSIS — M791 Myalgia, unspecified site: Secondary | ICD-10-CM

## 2018-06-26 MED ORDER — TRAMADOL HCL 50 MG PO TABS: 50.0000 mg | ORAL_TABLET | Freq: Every day | ORAL | 0 refills | Status: DC | PRN

## 2018-06-26 MED ORDER — BACLOFEN 10 MG PO TABS: 10.0000 mg | ORAL_TABLET | Freq: Two times a day (BID) | ORAL | 0 refills | Status: DC

## 2018-06-26 NOTE — Telephone Encounter (Signed)
Pt did not show for their appt with Dr. Athar today.  

## 2018-06-26 NOTE — Telephone Encounter (Signed)
Anthony [name] wants his refills sent to [name].

## 2018-06-27 NOTE — Telephone Encounter (Signed)
Patient advised.

## 2018-06-30 ENCOUNTER — Other Ambulatory Visit: Payer: Self-pay

## 2018-06-30 MED ORDER — ATORVASTATIN CALCIUM 40 MG PO TABS: 40.0000 mg | ORAL_TABLET | Freq: Every day | ORAL | 0 refills | Status: DC

## 2018-07-02 ENCOUNTER — Ambulatory Visit: Payer: Medicare Other | Admitting: Neurology

## 2018-07-02 ENCOUNTER — Telehealth: Payer: Self-pay | Admitting: Neurology

## 2018-07-02 ENCOUNTER — Other Ambulatory Visit: Payer: Self-pay

## 2018-07-02 ENCOUNTER — Encounter: Payer: Self-pay | Admitting: Neurology

## 2018-07-02 VITALS — BP 122/76 | HR 75 | Temp 97.5°F | Ht 69.0 in | Wt 212.0 lb

## 2018-07-02 DIAGNOSIS — R251 Tremor, unspecified: Secondary | ICD-10-CM | POA: Diagnosis not present

## 2018-07-02 DIAGNOSIS — R2689 Other abnormalities of gait and mobility: Secondary | ICD-10-CM

## 2018-07-02 DIAGNOSIS — G479 Sleep disorder, unspecified: Secondary | ICD-10-CM | POA: Diagnosis not present

## 2018-07-02 DIAGNOSIS — G3281 Cerebellar ataxia in diseases classified elsewhere: Secondary | ICD-10-CM | POA: Diagnosis not present

## 2018-07-02 NOTE — Progress Notes (Signed)
Subjective:    Patient ID: Anthony [name]. is a 69 y.o. male.  HPI     Anthony Age, MD, PhD [facility] [address], [address] [place], [place] [address]  Dear Dr. Sheppard Coil,  I saw your patient, Anthony [name], upon your kind request in my neurologic clinic today for initial consultation of his gait disorder and intermittent tremors.  The patient is unaccompanied today.  As you know, Anthony [name] is a 68 year old right-handed gentleman with an underlying complex medical history of coronary artery disease, history of DVT, hypertension, hyperlipidemia, history of MI, smoking, COPD, arthritis, mood disorder including diagnosis of bipolar disorder, and obesity, who reports an intermittent hand tremor in both upper extremities, right more so than left, noticeable particularly when he holds something or dials some numbers on his cell phone.  In the past couple of months he feels that the tremor is actually better.  He has found no obvious trigger and no alleviating factors.  He does not believe that the tremor is a major issue but does report balance problems, no recent falls thankfully.  He feels intermittently off balance.  He had been using a cane in the past, particularly after his hip replacement surgery on the right but does not actually use a cane.  He lives alone.  He has no pets.  He drinks caffeine in the form of coffee, a couple of cups in the mornings typically.  He tries to hydrate with water, estimates that he drinks about 5 to 6 cups of water per day.  He does not sleep well.  He does take several potentially sedating medications, including gabapentin 300 mg strength, he reports he takes it twice daily for leg pain, temazepam 15 mg each night but he reports that he takes a second dose sometimes, baclofen 10 mg strength twice daily, tramadol as needed, he estimates that he takes 1 pill every other day or so.  He has not seen an exacerbation of his tremor after  using his nebulizer.  He does not actually use his inhaler on a regular basis, did not find it helpful.  He is not sure if he snores.  He does have nocturia about once or twice per average night and denies morning headaches or a family history of sleep apnea.  He continues to smoke, he reports that he smokes about 2 packs/day on average.  I reviewed your office note from 04/22/2018. Of note, he had a head CT, cervical spine CT and maxillofacial CT without contrast on 12/07/2016 after a fall and I reviewed the results: IMPRESSION: 1. No acute intracranial abnormality identified. 2. Soft tissue swelling in the right periorbital region. The underlying globe is intact. 3. Degenerative changes in the cervical spine without fracture or malalignment.  His Past Medical History Is Significant For: Past Medical History:  Diagnosis Date  . Anxiety   . Arthritis   . Bipolar disorder ([place])   . CAD (coronary artery disease)    a. INF STEMI 07/04/10:  tx with thrombectomy + [facility] to [facility];  cath 07/04/10: dLM 10-20%, pLAD 40-50%, mLAD 20-30%, pRCA 30%, mRCA occluded and tx with PCI, EF 50% with inf HK. A Multilink  . COPD (chronic obstructive pulmonary disease) ([place])   . Depression   . Glucose intolerance (impaired glucose tolerance)    A1c 6.2 06/2010  . History of DVT (deep vein thrombosis)    traumatic, s/p coumadin tx.  Marland Kitchen HLD (hyperlipidemia)   .  Hypertension    pt states is currently on no medications  . Myocardial infarction ([place])    around 2013  . Shortness of breath    walking distance or climbing stairs  . Tobacco abuse   . Urinary frequency     His Past Surgical History Is Significant For: Past Surgical History:  Procedure Laterality Date  . CARDIAC CATHETERIZATION    . COLONOSCOPY N/A 12/29/2015   Procedure: COLONOSCOPY;  Surgeon: Rogene Houston, MD;  [facility];  Service: Endoscopy;  Laterality: N/A;  12:55  . ELECTROCARDIOGRAM     Showed inferolateral ST-elevation  and a code STEMI was activated. In the ER, he was treated with morphine, herarin, and 600 mg of Plavix. He was transferred emergently to [facility].   . INGUINAL HERNIA REPAIR Left 07/26/2014   Procedure: OPEN REPAIR OF RECURENT LEFT INGUINAL HERNIA REPAIR WITH MESH;  Surgeon: Greer Pickerel, MD;  Location: [facility] ORS;  Service: General;  Laterality: Left;  . INSERTION OF MESH Bilateral 10/30/2013   Procedure: INSERTION OF MESH;  Surgeon: Gayland Curry, MD;  Location: [facility];  Service: General;  Laterality: Bilateral;  . LAPAROSCOPIC INGUINAL HERNIA WITH UMBILICAL HERNIA Bilateral 10/30/2013   Procedure: laparoscopic repair left pantaloom hernia with mesh, laparoscopic right inguinal hernia with mesh, OPEN REPAIR OF UMBILICAL HERNIA ;  Surgeon: Gayland Curry, MD;  Location: [facility];  Service: General;  Laterality: Bilateral;  . LAPAROSCOPY N/A 07/26/2014   Procedure: LAPAROSCOPY DIAGNOSTIC;  Surgeon: Greer Pickerel, MD;  Location: [facility];  Service: General;  Laterality: N/A;  . POLYPECTOMY  12/29/2015   Procedure: POLYPECTOMY;  Surgeon: Rogene Houston, MD;  [facility];  Service: Endoscopy;;  ascending colon, descending colon  . stent placement    . TOTAL HIP ARTHROPLASTY Right 05/10/2015   Procedure: RIGHT TOTAL HIP ARTHROPLASTY ANTERIOR APPROACH;  Surgeon: Paralee Cancel, MD;  Location: [facility];  Service: Orthopedics;  Laterality: Right;  Marland Kitchen VASECTOMY      His Family History Is Significant For: Family History  Problem Relation Age of Onset  . Coronary artery disease Father   . Depression Father   . Heart attack Father   . Depression Mother     His Social History Is Significant For: Social History   Socioeconomic History  . Marital status: Divorced    Spouse name: Not on file  . Number of children: Not on file  . Years of education: Not on file  . Highest education level: Not on file  Occupational History  . Not on file  Social Needs  . Financial resource strain: Not on file   . Food insecurity    Worry: Not on file    Inability: Not on file  . Transportation needs    Medical: Not on file    Non-medical: Not on file  Tobacco Use  . Smoking status: Current Every Day Smoker    Packs/day: 1.00    Years: 45.00    Pack years: 45.00    Types: Cigarettes  . Smokeless tobacco: Never Used  Substance and Sexual Activity  . Alcohol use: No    Comment: past hx of ETOH use was in inpt facility per court order  - 20 years, 10-18-2015 per pt not anymore,per pt stopped about 15-20 yrs ago  . Drug use: No    Types: Marijuana, Heroin, Cocaine    Comment: snorted heroin and cocaine, 10-18-2015 per pt in the past he did Marijuana only  and nothing else  . Sexual activity: Not on file  Lifestyle  . Physical activity    Days per week: Not on file    Minutes per session: Not on file  . Stress: Not on file  Relationships  . Social Herbalist on phone: Not on file    Gets together: Not on file    Attends religious service: Not on file    Active member of club or organization: Not on file    Attends meetings of clubs or organizations: Not on file    Relationship status: Not on file  Other Topics Concern  . Not on file  Social History Narrative   Lives in [place]. He lives alone. He has kids but is not married.     His Allergies Are:  Allergies  Allergen Reactions  . Flomax [Tamsulosin Hcl] Other (See Comments)    This medication makes patient feel sick  . Penicillins Other (See Comments)    Reaction unknown occurred during childhood Has patient had a PCN reaction causing immediate rash, facial/tongue/throat swelling, SOB or lightheadedness with hypotension: unsure - childhood reaction Has patient had a PCN reaction causing severe rash involving mucus membranes or skin necrosis: unsure - childhood reaction Has patient had a PCN reaction that required hospitalization no Has patient had a PCN reaction occurring within the last 10 years: no If all of the  above answers are "NO", then may p  :   His Current Medications Are:  Outpatient Encounter Medications as of 07/02/2018  Medication Sig  . albuterol (PROVENTIL HFA;VENTOLIN HFA) 108 (90 Base) MCG/ACT inhaler Inhale 2 puffs into the lungs every 4 (four) hours as needed for wheezing or shortness of breath (AS NEEDED - RESCUE INHALER).  Marland Kitchen aspirin EC 81 MG tablet Take 1 tablet (81 mg total) by mouth daily.  Marland Kitchen atorvastatin (LIPITOR) 40 MG tablet Take 1 tablet (40 mg total) by mouth daily.  . baclofen (LIORESAL) 10 MG tablet Take 1 tablet (10 mg total) by mouth 2 (two) times daily.  . blood glucose meter kit and supplies KIT Dispense based on patient and insurance preference. Use to check BG once daily or every other other.  Dx: E11.9  . budesonide-formoterol (SYMBICORT) 80-4.5 MCG/ACT inhaler Inhale 2 puffs into the lungs 2 (two) times daily. EVERY DAY  . Cyanocobalamin (VITAMIN B-12 PO) Take 1,000 mg by mouth at bedtime.   . diclofenac (VOLTAREN) 75 MG EC tablet Take 1 tablet (75 mg total) by mouth 2 (two) times daily.  . fluticasone (FLONASE) 50 MCG/ACT nasal spray Place 2 sprays into both nostrils daily.  Marland Kitchen gabapentin (NEURONTIN) 300 MG capsule Take 1 capsule (300 mg total) by mouth 3 (three) times daily. AS NEEDED FOR NERVE PAIN  . glipiZIDE (GLUCOTROL) 10 MG tablet Take 1 tablet (10 mg total) by mouth 2 (two) times daily before a meal.  . ipratropium-albuterol (DUONEB) 0.5-2.5 (3) MG/3ML SOLN Take 3 mLs by nebulization every 4 (four) hours as needed (wheeze, cough, short of breath. Max use 6 vials per day).  Marland Kitchen lisinopril (PRINIVIL,ZESTRIL) 5 MG tablet Take 1 tablet (5 mg total) by mouth daily.  . metFORMIN (GLUCOPHAGE-XR) 500 MG 24 hr tablet Take 2 tablets (1,000 mg total) by mouth daily with breakfast. Short term until mail order arrives  . metoprolol succinate (TOPROL-XL) 25 MG 24 hr tablet Take 0.5 tablets (12.5 mg total) by mouth daily.  . Nebulizers (NEBULIZER COMPRESSOR) MISC Per insurance  coverage and patient preference. Dx  COPD  . Respiratory Therapy Supplies (NEBULIZER/TUBING/MOUTHPIECE) KIT Per insurance coverage and patient preference. Dx COPD  . sennosides-docusate sodium (SENOKOT-S) 8.6-50 MG tablet Take 1 tablet by mouth 3 (three) times daily as needed for constipation.   . temazepam (RESTORIL) 15 MG capsule Take 1 capsule (15 mg total) by mouth at bedtime as needed for sleep. Sparing use to prevent dependence  . traMADol (ULTRAM) 50 MG tablet Take 1 tablet (50 mg total) by mouth daily as needed for up to 30 days for severe pain.  . varenicline (CHANTIX CONTINUING MONTH PAK) 1 MG tablet Take 1 tablet (1 mg total) by mouth 2 (two) times daily.  . varenicline (CHANTIX STARTING MONTH PAK) 0.5 MG X 11 & 1 MG X 42 tablet Take one 0.5 mg tablet by mouth daily for 3 days, then increase to one 0.5 mg tablet bid for 4 days, then increase to one 1 mg tablet bid   No facility-administered encounter medications on file as of 07/02/2018.   :   Review of Systems:  Out of a complete 14 point review of systems, all are reviewed and negative with the exception of these symptoms as listed below:  Review of Systems  Neurological:       Pt presents today to discuss his occasional tremor. He notices it when he uses his phone. Pt is right handed.    Objective:  Neurological Exam  Physical Exam Physical Examination:   Vitals:   07/02/18 1000  BP: 122/76  Pulse: 75  Temp: (!) 97.5 F (36.4 C)   General Examination: The patient is a very pleasant 68 y.o. male in no acute distress. He appears well-developed and well-nourished and adequately groomed.   HEENT: Normocephalic, atraumatic, pupils are equal, round and reactive to light and accommodation. Extraocular tracking is good without limitation to gaze excursion or nystagmus noted. Normal smooth pursuit is noted. Hearing is grossly intact. Face is symmetric with normal facial animation and normal facial sensation. Speech is clear with  no dysarthria noted. There is no hypophonia. There is no lip, neck/head, jaw or voice tremor. Neck is supple with full range of passive and active motion. There are no carotid bruits on auscultation. Oropharynx exam reveals: moderate mouth dryness, edentulous state, moderate airway crowding, due to Wider tongue, thicker soft palate noted, tonsils not fully visualized and Mallampati is class III.  Neck circumference is 17-1/2 inches.  Tongue protrudes centrally in palate elevates symmetrically.  Chest: Clear to auscultation without wheezing, rhonchi or crackles noted.  Heart: S1+S2+0, regular and normal without murmurs, rubs or gallops noted.   Abdomen: Soft, non-tender and non-distended with normal bowel sounds appreciated on auscultation.  Extremities: There is no pitting edema in the distal lower extremities bilaterally.   Skin: Warm and dry without trophic changes noted. He has chronic appearing discoloration in the distal lower extremities bilaterally.  Musculoskeletal: exam reveals no obvious joint deformities, tenderness or joint swelling or erythema.   Neurologically:  Mental status: The patient is awake, alert and oriented in all 4 spheres. His immediate and remote memory, attention, language skills and fund of knowledge are fairly appropriate. There is no evidence of aphasia, agnosia, apraxia or anomia. Speech is clear with normal prosody and enunciation. Thought process is linear. Mood is decreased range and affect is constricted.  Cranial nerves II - XII are as described above under HEENT exam. In addition: shoulder shrug is normal with equal shoulder height noted.  On 07/02/2018: On Archimedes spiral drawing he has no  trembling with the right hand, mild insecurity but no consistent tremor with the left hand which is his nondominant hand.  Handwriting with the dominant hand is legible, not tremulous, not micrographic.  Motor exam: Normal bulk, strength and tone is noted. There is no  drift, tremor or rebound. Romberg is negative. Reflexes are 1+. Fine motor skills and coordination: intact with normal finger taps, normal hand movements, normal rapid alternating patting, normal foot taps and normal foot agility.  Cerebellar testing: No dysmetria or intention tremor on finger to nose testing. There is no truncal or gait ataxia.  Sensory exam: intact to light touch.  Gait, station and balance: He stands Without major difficulty.  He can stand narrow based.  He walks with preserved arm swing, no shuffling is noted mild nonspecific insecurity is noted, particularly with turning.               Assessment and Plan:    In summary, Anthony Broski. is a very pleasant 68 y.o.-year old male with an underlying complex medical history of coronary artery disease, history of DVT, hypertension, hyperlipidemia, history of MI, smoking, COPD, arthritis, mood disorder including diagnosis of bipolar disorder (per chart review), and obesity, who Presents for evaluation of his balance issue/gait disorder as well as intermittent tremors.  On examination, he had is no telltale tremor, no postural, action or resting tremor, no evidence of parkinsonism. He is reassured in that regard.  As far as his balance, he has nonspecific gait disorder, likely due to a combination of factors, he is taking several potentially sedating medications and medication effect needs to be considered.  In addition, he may have other contributors such as atherosclerotic changes of the brain that can affect balance, he continues to smoke and is advised to quit smoking altogether.  He may not always hydrate well enough and is advised to increase his water intake.  I would like to proceed with laboratory testing to affirm that his kidney function is okay and then proceed with a brain MRI with and without contrast.  In addition, I think he would benefit from ruling out underlying obstructive sleep apnea.  He reports that he does not sleep  very well.  Sleep deprivation may affect tremors and gait and balance.  I will see him after testing is completed and we will keep him posted as to his test results in the interim by phone call. I did not suggest any new medications, in fact, I recommend that sedating medications be reduced if possible. He is reminded not to use more temazepam than prescribed, his prescription allows for 1 pill as needed at bedtime for sleep, he has taken the occasional second pill and is discouraged from doing this. If he has sleep apnea, I would recommend CPAP therapy for this.  He would be willing to try CPAP therapy if the need arises.  I plan to see him back after his testing.  I answered all his questions today and he was in agreement. Thank you very much for allowing me to participate in the care of this nice patient. If I can be of any further assistance to you please do not hesitate to call me at [phone].  Sincerely,   Anthony Age, MD, PhD

## 2018-07-02 NOTE — Telephone Encounter (Signed)
[employer] medicare order sent to GI. No auth they will reach out to the patient to schedule.  

## 2018-07-02 NOTE — Patient Instructions (Addendum)
You do not have any obvious tremor today. Your balance issue may be nonspecific and secondary to more than 1 factor including medication effect from taking several potentially sedating medications, arthritis with replacement of the right hip, age related changes in balance, poor sleep, and possible atherosclerosis of the blood vessels in your brain; some Changes were seen on the CT of your head in 2018, I would like to proceed with a brain MRI with and without contrast.  Please remember to stand up slowly and get your bearings first turn slowly, no bending down to pick anything, no heavy lifting, be extra careful at night and first thing in the morning. Also, be careful in the Bathroom and the kitchen.   Remember to drink plenty of fluid, eat healthy meals and do not skip any meals. Try to eat protein with a every meal and eat a healthy snack such as fruit or nuts or yogurt in between meals. Try to keep a regular sleep-wake schedule and try to exercise daily, particularly in the form of walking, 20-30 miutes a day, if you can. You should consider using a cane.   As far as your medications are concerned, I would like to suggest no new medications.   As far as diagnostic testing: I suggest we proceed with a brain scan, called MRI. We will call you with the test results. We will have to schedule you for this on a separate date. This test requires authorization from your insurance, and we will take care of the insurance process. In addition, I would recommend we check blood work for thyroid screening test and kidney function. I would recommend a sleep study to rule out obstructive sleep apnea as you do not sleep well.

## 2018-07-03 ENCOUNTER — Telehealth: Payer: Self-pay | Admitting: Neurology

## 2018-07-03 LAB — COMPREHENSIVE METABOLIC PANEL
ALT: 39 IU/L (ref 0–44)
AST: 24 IU/L (ref 0–40)
Albumin/Globulin Ratio: 1.6 (ref 1.2–2.2)
Albumin: 4.7 g/dL (ref 3.8–4.8)
Alkaline Phosphatase: 74 IU/L (ref 39–117)
BUN/Creatinine Ratio: 17 (ref 10–24)
BUN: 19 mg/dL (ref 8–27)
Bilirubin Total: 0.2 mg/dL (ref 0.0–1.2)
CO2: 20 mmol/L (ref 20–29)
Calcium: 9.6 mg/dL (ref 8.6–10.2)
Chloride: 102 mmol/L (ref 96–106)
Creatinine, Ser: 1.14 mg/dL (ref 0.76–1.27)
GFR calc Af Amer: 75 mL/min/{1.73_m2} (ref >59)
GFR calc non Af Amer: 65 mL/min/{1.73_m2} (ref >59)
Globulin, Total: 2.9 g/dL (ref 1.5–4.5)
Glucose: 124 mg/dL — ABNORMAL HIGH (ref 65–99)
Potassium: 5.1 mmol/L (ref 3.5–5.2)
Sodium: 141 mmol/L (ref 134–144)
Total Protein: 7.6 g/dL (ref 6.0–8.5)

## 2018-07-03 LAB — TSH: TSH: 1.68 u[IU]/mL (ref 0.450–4.500)

## 2018-07-03 NOTE — Telephone Encounter (Signed)
Pt returned call and was informed of his lab results. Pt verbalized his understanding and then asked to be transferred to the sleep lab to return their call.

## 2018-07-03 NOTE — Telephone Encounter (Signed)
-----   Message from Darleen Crocker, RN sent at 07/03/2018  8:14 AM EDT -----  ----- Message ----- From: Star Age, MD Sent: 07/03/2018   8:01 AM EDT To: Lester Folsom, RN  Labs show normal findings for kidney and liver function, thyroid screen. Ok to proceed with MRI br w/wo contrast.  Please update pt. Michel Bickers

## 2018-07-03 NOTE — Progress Notes (Signed)
Labs show normal findings for kidney and liver function, thyroid screen. Ok to proceed with MRI br w/wo contrast.  Please update pt. Anthony [name]

## 2018-07-03 NOTE — Telephone Encounter (Signed)
Called the pt to review lab work. No answer. No DPR on file to Leave details on message. LVM informing patient to call back.   **If pt calls back please advise pt that lab work was all in normal range and this means that it would be ok to have his MRI completed.

## 2018-07-07 ENCOUNTER — Other Ambulatory Visit: Payer: Self-pay

## 2018-07-07 DIAGNOSIS — G4700 Insomnia, unspecified: Secondary | ICD-10-CM

## 2018-07-07 MED ORDER — TEMAZEPAM 15 MG PO CAPS: 15.0000 mg | ORAL_CAPSULE | Freq: Every evening | ORAL | 1 refills | Status: DC | PRN

## 2018-07-07 MED ORDER — TEMAZEPAM 15 MG PO CAPS: 15.0000 mg | ORAL_CAPSULE | Freq: Every evening | ORAL | 2 refills | Status: DC | PRN

## 2018-07-07 MED ORDER — TEMAZEPAM 15 MG PO CAPS: 15.0000 mg | ORAL_CAPSULE | Freq: Every evening | ORAL | 0 refills | Status: DC | PRN

## 2018-07-07 NOTE — Telephone Encounter (Signed)
Anthony [name] needs a 15 day supply of the Temazepam sent to the local pharmacy and a 90 day sent to mail order. Please sign if appropriate.

## 2018-07-08 ENCOUNTER — Telehealth: Payer: Self-pay

## 2018-07-08 NOTE — Telephone Encounter (Signed)
Received refill request from [facility] for metformin. Contacted pt to verify current pharmacy. As per pt, using [name] mail order pharmacy for maintenance. Pt requested to cancel refill order request from local pharmacy. Task completed. Pt states he will be doing a sleep study on 07/31/18. Pt states still having sleep issues, occasionally takes 1 - 2 of temazepam. Informed pt to take med as directed. Pt voice understanding of not increasing quantity intake and will use rx as directed. No other inquiries during call.

## 2018-07-10 ENCOUNTER — Telehealth: Payer: Self-pay | Admitting: Osteopathic Medicine

## 2018-07-10 DIAGNOSIS — R911 Solitary pulmonary nodule: Secondary | ICD-10-CM

## 2018-07-10 NOTE — Telephone Encounter (Signed)
Pt has been updated. Aware of CT scan f/u. Agrees with recommendation. Pt mentioned during call that since yesterday his feet has been swollen. No concerns of redness, warmness or pain. Pt requesting feed back from provider. Pls advise, thanks.

## 2018-07-10 NOTE — Telephone Encounter (Signed)
Could not say for sure without seeing him, if it gets worse over the weekend especially if chest pain or shortness of breath he should seek emergency care.  Otherwise, keep legs elevated

## 2018-07-10 NOTE — Telephone Encounter (Signed)
CT in 03/2018 advised follow-up in 3-6 months to follow-up on small lung nodules, I went ahead and ordered that test

## 2018-07-10 NOTE — Telephone Encounter (Signed)
Left a detailed vm msg for pt regarding provider's note & recommendation. Direct call back info provided.

## 2018-07-14 ENCOUNTER — Ambulatory Visit (INDEPENDENT_AMBULATORY_CARE_PROVIDER_SITE_OTHER): Payer: Medicare Other | Admitting: Osteopathic Medicine

## 2018-07-14 ENCOUNTER — Other Ambulatory Visit: Payer: Self-pay

## 2018-07-14 ENCOUNTER — Encounter: Payer: Self-pay | Admitting: Osteopathic Medicine

## 2018-07-14 VITALS — BP 120/66 | HR 78 | Temp 98.0°F | Wt 211.2 lb

## 2018-07-14 DIAGNOSIS — E1165 Type 2 diabetes mellitus with hyperglycemia: Secondary | ICD-10-CM | POA: Diagnosis not present

## 2018-07-14 LAB — POCT GLYCOSYLATED HEMOGLOBIN (HGB A1C): Hemoglobin A1C: 7.9 % — AB (ref 4.0–5.6)

## 2018-07-14 MED ORDER — SILDENAFIL CITRATE 100 MG PO TABS: 50.0000 mg | ORAL_TABLET | Freq: Every day | ORAL | 11 refills | Status: DC | PRN

## 2018-07-14 NOTE — Progress Notes (Signed)
HPI: Anthony [name]. is a 68 y.o. male who  has a past medical history of Anxiety, Arthritis, Bipolar disorder ([place]), CAD (coronary artery disease), COPD (chronic obstructive pulmonary disease) ([place]), Depression, Glucose intolerance (impaired glucose tolerance), History of DVT (deep vein thrombosis), HLD (hyperlipidemia), Hypertension, Myocardial infarction ([place]), Shortness of breath, Tobacco abuse, and Urinary frequency.  he presents to [facility] today, 07/14/18,  for chief complaint of:  DM2 check-up   DIABETES SCREENING/PREVENTIVE CARE:  A1C  10/2017: 7.6%  01/16/18: 9.3%pt has been unable to afford non-generic medications, only taking max metformin and glipizide  04/15/18: 7.8% doing ok with diet, not really exercising   Today 07/14/18: 7.9% taking max metformin and glipizide   BP goal <130/80:Yes BP Readings from Last 3 Encounters:  07/14/18 120/66  07/02/18 122/76  04/22/18 136/68    LDL goal <70:No Last checked 02/2017 was 125, started Lipitor  Eye exam annually:none on file, importance discussed with patient Foot exam:needs Microalbuminuria:done previous visit this year, (+), we started ACEI  Metformin:Yes ACE/ARB:Yes, lisinopril low dose at 5 mg  Antiplatelet if ASCVD Risk >10%:Yes Statin:Yes Pneumovax:has declined     At today's visit 07/14/18 ... PMH, PSH, FH reviewed and updated as needed.  Current medication list and allergy/intolerance hx reviewed and updated as needed. (See remainder of HPI, ROS, Phys Exam below)   No results found.  No results found for this or any previous visit (from the past 72 hour(s)).        ASSESSMENT/PLAN: The encounter diagnosis was Type 2 diabetes mellitus with hyperglycemia, without long-term current use of insulin ([place]).   Orders Placed This Encounter  Procedures  . POCT HgB A1C     Meds ordered this encounter  Medications  . sildenafil  (VIAGRA) 100 MG tablet    Sig: Take 0.5-1 tablets (50-100 mg total) by mouth daily as needed for erectile dysfunction.    Dispense:  10 tablet    Refill:  11     Follow-up plan: Return in about 3 months (around 10/14/2018) for follow-up diabetes, labs 2-3 days before visit (orders are in) .                                                 ################################################# ################################################# ################################################# #################################################    No outpatient medications have been marked as taking for the 07/14/18 encounter (Office Visit) with Emeterio Reeve, DO.    Allergies  Allergen Reactions  . Flomax [Tamsulosin Hcl] Other (See Comments)    This medication makes patient feel sick  . Penicillins Other (See Comments)    Reaction unknown occurred during childhood Has patient had a PCN reaction causing immediate rash, facial/tongue/throat swelling, SOB or lightheadedness with hypotension: unsure - childhood reaction Has patient had a PCN reaction causing severe rash involving mucus membranes or skin necrosis: unsure - childhood reaction Has patient had a PCN reaction that required hospitalization no Has patient had a PCN reaction occurring within the last 10 years: no If all of the above answers are "NO", then may p       Review of Systems:  Constitutional: No recent illness  Cardiac: No  chest pain, No  pressure, No palpitations  Respiratory:  No  shortness of breath. No  Cough  Neurologic: No  weakness, No  Dizziness  Psychiatric: No  concerns with  depression, No  concerns with anxiety  Exam:  BP 120/66 (BP Location: Left Arm, Patient Position: Sitting, Cuff Size: Normal)   Pulse 78   Temp 98 F (36.7 C) (Oral)   Wt 211 lb 3.2 oz (95.8 kg)   BMI 31.19 kg/m   Constitutional: VS see above. General Appearance: alert,  well-developed, well-nourished, NAD  Eyes: Normal lids and conjunctive, non-icteric sclera  Neck: No masses, trachea midline.   Respiratory: Normal respiratory effort. no wheeze, no rhonchi, no rales  Cardiovascular: S1/S2 normal  Musculoskeletal: Gait normal. Symmetric and independent movement of all extremities  Neurological: Normal balance/coordination. No tremor.  Skin: warm, dry, intact.   Psychiatric: Normal judgment/insight. Normal mood and affect. Oriented x3.       Visit summary with medication list and pertinent instructions was printed for patient to review, patient was advised to alert [place] if any updates are needed. All questions at time of visit were answered - patient instructed to contact office with any additional concerns. ER/RTC precautions were reviewed with the patient and understanding verbalized.   ftware was used to produce this document, and typos may escape review. Please contact Dr. Sheppard Coil for any needed clarifications.    Follow up plan: Return in about 3 months (around 10/14/2018) for follow-up diabetes, labs 2-3 days before visit (orders are in) .

## 2018-07-15 ENCOUNTER — Ambulatory Visit (INDEPENDENT_AMBULATORY_CARE_PROVIDER_SITE_OTHER): Payer: Medicare Other

## 2018-07-15 ENCOUNTER — Ambulatory Visit: Payer: Self-pay | Admitting: Osteopathic Medicine

## 2018-07-15 DIAGNOSIS — R918 Other nonspecific abnormal finding of lung field: Secondary | ICD-10-CM | POA: Diagnosis not present

## 2018-07-15 DIAGNOSIS — R911 Solitary pulmonary nodule: Secondary | ICD-10-CM

## 2018-07-15 DIAGNOSIS — J439 Emphysema, unspecified: Secondary | ICD-10-CM | POA: Diagnosis not present

## 2018-07-26 ENCOUNTER — Other Ambulatory Visit: Payer: Self-pay

## 2018-07-26 ENCOUNTER — Ambulatory Visit
Admission: RE | Admit: 2018-07-26 | Discharge: 2018-07-26 | Disposition: A | Discharge status: Home / Self Care | Payer: Medicare Other | Source: Ambulatory Visit | Attending: Neurology | Admitting: Neurology

## 2018-07-26 DIAGNOSIS — G3281 Cerebellar ataxia in diseases classified elsewhere: Secondary | ICD-10-CM

## 2018-07-26 DIAGNOSIS — R2689 Other abnormalities of gait and mobility: Secondary | ICD-10-CM | POA: Diagnosis not present

## 2018-07-26 DIAGNOSIS — G479 Sleep disorder, unspecified: Secondary | ICD-10-CM

## 2018-07-26 DIAGNOSIS — R251 Tremor, unspecified: Secondary | ICD-10-CM | POA: Diagnosis not present

## 2018-07-26 MED ORDER — GADOBENATE DIMEGLUMINE 529 MG/ML IV SOLN
20.0000 mL | Freq: Once | INTRAVENOUS | Status: AC | PRN
  Administered 2018-07-26: 16:00:00 20 mL via INTRAVENOUS

## 2018-07-29 ENCOUNTER — Telehealth: Payer: Self-pay

## 2018-07-29 NOTE — Telephone Encounter (Signed)
I called pt and discussed his MRI results and recommendations with him. Pt verbalized understanding of results. Pt had no questions at this time but was encouraged to call back if questions arise.

## 2018-07-29 NOTE — Progress Notes (Signed)
Please call patient regarding the recent brain MRI: The brain scan showed a normal structure of the brain and mild volume loss which we call atrophy. There were changes in the deeper structures of the brain, which we call white matter changes or microvascular changes. These were reported as moderate in His case. These are tiny white spots, that occur with time and are seen in a variety of conditions, including with normal aging, chronic hypertension, chronic headaches, especially migraine HAs, chronic diabetes, chronic hyperlipidemia and smoking. These are not strokes and no mass or lesion or contrast enhancement was seen which is reassuring. Again, there were no acute findings, such as a stroke, or mass or blood products. No further action is required on this test at this time, other than re-enforcing the importance of good blood pressure control, good cholesterol control, good blood sugar control, and weight management. Please remind patient to keep any upcoming appointments or tests and to call us with any interim questions, concerns, problems or updates.  Scheduled for PSG later this month, we will call him after that test. Thanks,  Star Age, MD, PhD

## 2018-07-29 NOTE — Telephone Encounter (Signed)
-----   Message from Star Age, MD sent at 07/29/2018 10:28 AM EDT ----- Please call patient regarding the recent brain MRI: The brain scan showed a normal structure of the brain and mild volume loss which we call atrophy. There were changes in the deeper structures of the brain, which we call white matter changes or microvascular changes. These were reported as moderate in His case. These are tiny white spots, that occur with time and are seen in a variety of conditions, including with normal aging, chronic hypertension, chronic headaches, especially migraine HAs, chronic diabetes, chronic hyperlipidemia and smoking. These are not strokes and no mass or lesion or contrast enhancement was seen which is reassuring. Again, there were no acute findings, such as a stroke, or mass or blood products. No further action is required on this test at this time, other than re-enforcing the importance of good blood pressure control, good cholesterol control, good blood sugar control, and weight management. Please remind patient to keep any upcoming appointments or tests and to call us with any interim questions, concerns, problems or updates.  Scheduled for PSG later this month, we will call him after that test. Thanks,  Star Age, MD, PhD

## 2018-07-30 ENCOUNTER — Encounter: Payer: Self-pay | Admitting: Pulmonary Disease

## 2018-07-30 ENCOUNTER — Ambulatory Visit: Payer: Medicare Other | Admitting: Pulmonary Disease

## 2018-07-30 ENCOUNTER — Other Ambulatory Visit: Payer: Self-pay

## 2018-07-30 VITALS — BP 112/64 | HR 62 | Temp 98.0°F | Ht 69.0 in | Wt 207.0 lb

## 2018-07-30 DIAGNOSIS — J439 Emphysema, unspecified: Secondary | ICD-10-CM

## 2018-07-30 DIAGNOSIS — R918 Other nonspecific abnormal finding of lung field: Secondary | ICD-10-CM | POA: Diagnosis not present

## 2018-07-30 MED ORDER — INCRUSE ELLIPTA 62.5 MCG/INH IN AEPB: 1.0000 | INHALATION_SPRAY | Freq: Every day | RESPIRATORY_TRACT | 3 refills | Status: DC

## 2018-07-30 MED ORDER — SPIRIVA RESPIMAT 2.5 MCG/ACT IN AERS: 2.0000 | INHALATION_SPRAY | Freq: Every day | RESPIRATORY_TRACT | 3 refills | Status: DC

## 2018-07-30 MED ORDER — SPIRIVA RESPIMAT 2.5 MCG/ACT IN AERS: 2.0000 | INHALATION_SPRAY | Freq: Every day | RESPIRATORY_TRACT | 0 refills | Status: DC

## 2018-07-30 NOTE — Progress Notes (Signed)
Subjective:   PATIENT ID: Anthony [name]. GENDER: male DOB: 17-Dec-1950, MRN: [id number]   HPI  Chief Complaint  Patient presents with  . Consult    Referred by Dr. Emeterio Reeve for emphysema. States he has not noticed any breathing difficulties. Increased fatigue.     Reason for Visit: New consult for lung nodules and emphysema.  Anthony [name]. is a 68 year old male active smoker, hx MI, emphysema who presents for management of emphysema and lung nodules.  He report that he does not have any issues breathing but does state the he has had to limit his activity significantly over the years. Has chronic daily congested cough with some sputum. Reports he uses albuterol nebulizer 3-4 times a day. His PCP has prescribed bronchodilators for his emphysema however does not use symbicort or handheld albuterol as he does not feel this is effective. Denies chest pain, hemoptysis, shortness of breath, wheezing, and lower extremity edema.  He is worried about his lung nodules which he has been aware of since earlier this spring. Actively continues to smoke. Is not interested in quitting at this time.   Social History: Active smoker. Currently smokes 1.5 pack per day  Environmental exposures: None  I have personally reviewed patient's past medical/family/social history, allergies, current medications.  Past Medical History:  Diagnosis Date  . Anxiety   . Arthritis   . Bipolar disorder ([place])   . CAD (coronary artery disease)    a. INF STEMI 07/04/10:  tx with thrombectomy + [facility] to [facility];  cath 07/04/10: dLM 10-20%, pLAD 40-50%, mLAD 20-30%, pRCA 30%, mRCA occluded and tx with PCI, EF 50% with inf HK. A Multilink  . COPD (chronic obstructive pulmonary disease) ([place])   . Depression   . Glucose intolerance (impaired glucose tolerance)    A1c 6.2 06/2010  . History of DVT (deep vein thrombosis)    traumatic, s/p coumadin tx.  [place] HLD (hyperlipidemia)   . Hypertension    pt  states is currently on no medications  . Myocardial infarction ([place])    around 2013  . Shortness of breath    walking distance or climbing stairs  . Tobacco abuse   . Urinary frequency      Family History  Problem Relation Age of Onset  . Coronary artery disease Father   . Depression Father   . Heart attack Father   . Depression Mother      Social History   Occupational History  . Not on file  Tobacco Use  . Smoking status: Current Every Day Smoker    Packs/day: 1.00    Years: 45.00    Pack years: 45.00    Types: Cigarettes  . Smokeless tobacco: Never Used  Substance and Sexual Activity  . Alcohol use: No    Comment: past hx of ETOH use was in inpt facility per court order  - 20 years, 10-18-2015 per pt not anymore,per pt stopped about 15-20 yrs ago  . Drug use: No    Types: Marijuana, Heroin, Cocaine    Comment: snorted heroin and cocaine, 10-18-2015 per pt in the past he did Marijuana only and nothing else  . Sexual activity: Not on file    Allergies  Allergen Reactions  . Flomax [Tamsulosin Hcl] Other (See Comments)    This medication makes patient feel sick  . Penicillins Other (See Comments)    Reaction unknown occurred during childhood Has patient had a PCN reaction causing  immediate rash, facial/tongue/throat swelling, SOB or lightheadedness with hypotension: unsure - childhood reaction Has patient had a PCN reaction causing severe rash involving mucus membranes or skin necrosis: unsure - childhood reaction Has patient had a PCN reaction that required hospitalization no Has patient had a PCN reaction occurring within the last 10 years: no If all of the above answers are "NO", then may p     Outpatient Medications Prior to Visit  Medication Sig Dispense Refill  . albuterol (PROVENTIL HFA;VENTOLIN HFA) 108 (90 Base) MCG/ACT inhaler Inhale 2 puffs into the lungs every 4 (four) hours as needed for wheezing or shortness of breath (AS NEEDED - RESCUE INHALER). 1  Inhaler 1  . aspirin EC 81 MG tablet Take 1 tablet (81 mg total) by mouth daily. 90 tablet 3  . atorvastatin (LIPITOR) 40 MG tablet Take 1 tablet (40 mg total) by mouth daily. 90 tablet 0  . baclofen (LIORESAL) 10 MG tablet Take 1 tablet (10 mg total) by mouth 2 (two) times daily. 180 tablet 0  . blood glucose meter kit and supplies KIT Dispense based on patient and insurance preference. Use to check BG once daily or every other other.  Dx: E11.9 1 each 0  . Cyanocobalamin (VITAMIN B-12 PO) Take 1,000 mg by mouth at bedtime.     . diclofenac (VOLTAREN) 75 MG EC tablet Take 1 tablet (75 mg total) by mouth 2 (two) times daily. 60 tablet 2  . fluticasone (FLONASE) 50 MCG/ACT nasal spray Place 2 sprays into both nostrils daily. 16 g 6  . gabapentin (NEURONTIN) 300 MG capsule Take 1 capsule (300 mg total) by mouth 3 (three) times daily. AS NEEDED FOR NERVE PAIN 180 capsule 3  . glipiZIDE (GLUCOTROL) 10 MG tablet Take 1 tablet (10 mg total) by mouth 2 (two) times daily before a meal. 60 tablet 3  . ipratropium-albuterol (DUONEB) 0.5-2.5 (3) MG/3ML SOLN Take 3 mLs by nebulization every 4 (four) hours as needed (wheeze, cough, short of breath. Max use 6 vials per day). 360 mL 99  . lisinopril (PRINIVIL,ZESTRIL) 5 MG tablet Take 1 tablet (5 mg total) by mouth daily. 90 tablet 3  . metFORMIN (GLUCOPHAGE-XR) 500 MG 24 hr tablet Take 2 tablets (1,000 mg total) by mouth daily with breakfast. Short term until mail order arrives 60 tablet 0  . metoprolol succinate (TOPROL-XL) 25 MG 24 hr tablet Take 0.5 tablets (12.5 mg total) by mouth daily. 90 tablet 0  . Nebulizers (NEBULIZER COMPRESSOR) MISC Per insurance coverage and patient preference. Dx COPD 1 each 1  . Respiratory Therapy Supplies (NEBULIZER/TUBING/MOUTHPIECE) KIT Per insurance coverage and patient preference. Dx COPD 1 each 39  . sennosides-docusate sodium (SENOKOT-S) 8.6-50 MG tablet Take 1 tablet by mouth 3 (three) times daily as needed for  constipation.     . sildenafil (VIAGRA) 100 MG tablet Take 0.5-1 tablets (50-100 mg total) by mouth daily as needed for erectile dysfunction. 10 tablet 11  . temazepam (RESTORIL) 15 MG capsule Take 1 capsule (15 mg total) by mouth at bedtime as needed for sleep. 90 capsule 1  . varenicline (CHANTIX CONTINUING MONTH PAK) 1 MG tablet Take 1 tablet (1 mg total) by mouth 2 (two) times daily. 90 tablet 1  . varenicline (CHANTIX STARTING MONTH PAK) 0.5 MG X 11 & 1 MG X 42 tablet Take one 0.5 mg tablet by mouth daily for 3 days, then increase to one 0.5 mg tablet bid for 4 days, then increase to one 1 mg  tablet bid 53 tablet 0  . budesonide-formoterol (SYMBICORT) 80-4.5 MCG/ACT inhaler Inhale 2 puffs into the lungs 2 (two) times daily. EVERY DAY 1 Inhaler 5   No facility-administered medications prior to visit.     Review of Systems  Constitutional: Negative for chills, diaphoresis, fever, malaise/fatigue and weight loss.  HENT: Negative for congestion, ear pain and sore throat.   Respiratory: Positive for cough and sputum production. Negative for hemoptysis, shortness of breath and wheezing.   Cardiovascular: Negative for chest pain, palpitations and leg swelling.  Gastrointestinal: Negative for abdominal pain, heartburn and nausea.  Genitourinary: Negative for frequency.  Musculoskeletal: Negative for joint pain and myalgias.  Skin: Negative for itching and rash.  Neurological: Negative for dizziness, weakness and headaches.  Endo/Heme/Allergies: Does not bruise/bleed easily.  Psychiatric/Behavioral: Negative for depression. The patient is not nervous/anxious.     Objective:   Vitals:   07/30/18 1023  BP: 112/64  Pulse: 62  Temp: 98 F (36.7 C)  TempSrc: Oral  SpO2: 94%  Weight: 207 lb (93.9 kg)  Height: 5' 9"  (1.753 m)   SpO2: 94 %  Physical Exam: General: Well-appearing, no acute distress HENT: [place], AT, OP clear, MMM Eyes: EOMI, no scleral icterus Respiratory: Diminished breath  sounds bilaterally. No crackles, wheezing or rales Cardiovascular: RRR, -M/R/G, no JVD GI: BS+, soft, nontender Extremities:-Edema,-tenderness Neuro: AAO x4, CNII-XII grossly intact Skin: Intact, no rashes or bruising Psych: Normal mood, normal affect  Data Reviewed:  Imaging: CT Chest 02/11/18 - Bilateral small pulmonary nodules ~12m in RUL and LLL, moderate emphysema CT Chest 07/15/18 - Unchanged small pulmonary nodules <611mRUL x 2 and LLL x 1, moderate emphysema  PFT: None on file  Labs: CMP 07/02/18 reviewed. Normal electrolytes except for elevated glucose 124  Imaging, labs and tests noted above have been reviewed independently by me.    Assessment & Plan:   Discussion: [age]ear old male with emphysema who presents for management of lung nodules and emphysema.  Emphysema START Spiriva Respimat 2 puffs once a day. Take this inhaler EVERYDAY Continue to take your albuterol inhaler/nebulizer as needed for shortness of breath or wheezing We will schedule breathing tests before your next visit to evaluate the severity of your COPD  Lung Nodules Will schedule for CT Chest in one year  Health Maintenance Pneumonia - review next visit Influenza 10/16/17  Orders Placed This Encounter  Procedures  . AMB Ref[facility] to [facility]    Referral Priority:   Routine    Referral Type:   Consultation    Referral Reason:   [facility]    Number of Visits Requested:   1  . Pulmonary function test    Standing Status:   Future    Standing Expiration Date:   07/30/2019    Order Specific Question:   Where should this test be performed?    Answer:   [facility]    Order Specific Question:   Full PFT: includes the following: basic spirometry, spirometry pre & post bronchodilator, diffusion capacity (DLCO), lung volumes    Answer:   Full PFT   Meds ordered this encounter  Medications  . DISCONTD: Tiotropium Bromide Monohydrate (SPIRIVA RESPIMAT) 2.5 MCG/ACT AERS     Sig: Inhale 2 puffs into the lungs daily.    Dispense:  4 g    Refill:  3  . DISCONTD: umeclidinium bromide (INCRUSE ELLIPTA) 62.5 MCG/INH AEPB    Sig: Inhale 1 puff into the lungs daily.  Dispense:  30 each    Refill:  3  . Tiotropium Bromide Monohydrate (SPIRIVA RESPIMAT) 2.5 MCG/ACT AERS    Sig: Inhale 2 puffs into the lungs daily.    Dispense:  4 g    Refill:  0    Order Specific Question:   Lot Number?    Answer:   [id number]    Order Specific Question:   Expiration Date?    Answer:   08/07/2020    Return in about 3 months (around 10/30/2018).  [place], MD [facility] 08/03/2018 4:53 PM  Office Number [phone]

## 2018-07-30 NOTE — Patient Instructions (Addendum)
Emphysema START Spiriva Respimat 2 puffs once a day. Take this inhaler EVERYDAY Continue to take your albuterol inhaler/nebulizer as needed for shortness of breath or wheezing We will schedule breathing tests before your next visit to evaluate the severity of your COPD  Lung Nodules Will schedule for CT Chest in one year  Follow-up in 3 months

## 2018-07-30 NOTE — Progress Notes (Deleted)
   Subjective:    Patient ID: Anthony [name]., male    DOB: April 08, 1950, 68 y.o.   MRN: [id number]  HPI    Review of Systems  Constitutional: Negative for fever and unexpected weight change.  HENT: Positive for sneezing. Negative for congestion, dental problem, ear pain, nosebleeds, postnasal drip, rhinorrhea, sinus pressure, sore throat and trouble swallowing.   Eyes: Negative for redness and itching.  Respiratory: Positive for cough. Negative for chest tightness, shortness of breath and wheezing.   Cardiovascular: Negative for palpitations and leg swelling.  Gastrointestinal: Negative for nausea and vomiting.  Genitourinary: Negative for dysuria.  Musculoskeletal: Negative for joint swelling.  Skin: Negative for rash.  Allergic/Immunologic: Negative.  Negative for environmental allergies, food allergies and immunocompromised state.  Neurological: Negative for headaches.  Hematological: Does not bruise/bleed easily.  Psychiatric/Behavioral: Negative for dysphoric mood. The patient is nervous/anxious.        Objective:   Physical Exam        Assessment & Plan:

## 2018-07-31 ENCOUNTER — Other Ambulatory Visit: Payer: Self-pay

## 2018-07-31 ENCOUNTER — Ambulatory Visit (INDEPENDENT_AMBULATORY_CARE_PROVIDER_SITE_OTHER): Payer: Medicare Other | Admitting: Neurology

## 2018-07-31 DIAGNOSIS — R05 Cough: Secondary | ICD-10-CM

## 2018-07-31 DIAGNOSIS — G4734 Idiopathic sleep related nonobstructive alveolar hypoventilation: Secondary | ICD-10-CM | POA: Diagnosis not present

## 2018-07-31 DIAGNOSIS — R2689 Other abnormalities of gait and mobility: Secondary | ICD-10-CM

## 2018-07-31 DIAGNOSIS — R0683 Snoring: Secondary | ICD-10-CM

## 2018-07-31 DIAGNOSIS — G4720 Circadian rhythm sleep disorder, unspecified type: Secondary | ICD-10-CM

## 2018-07-31 DIAGNOSIS — R058 Other specified cough: Secondary | ICD-10-CM

## 2018-07-31 DIAGNOSIS — G3281 Cerebellar ataxia in diseases classified elsewhere: Secondary | ICD-10-CM

## 2018-07-31 DIAGNOSIS — G4761 Periodic limb movement disorder: Secondary | ICD-10-CM

## 2018-07-31 DIAGNOSIS — G479 Sleep disorder, unspecified: Secondary | ICD-10-CM

## 2018-07-31 DIAGNOSIS — R251 Tremor, unspecified: Secondary | ICD-10-CM

## 2018-07-31 NOTE — Patient Outreach (Signed)
[facility]) Care Management  07/31/2018  [name] [birth year] [id number]   Medication Adherence call to Anthony [name] [facility] Ident[facility] Verify spoke with patient he is past due on Lisinopril 5 mg patient explain he is taking 1 tablet daily and has plenty at this time patient said there has been a confusion between [facility] and [name] on filling his prescription patient is using [facility] at this time.Anthony [name] is showing past due under [name].   [facility] Direct Dial [phone]  Fax [phone] [email or web address]

## 2018-07-31 NOTE — Progress Notes (Signed)
This encounter was created in error - please disregard.

## 2018-08-03 ENCOUNTER — Encounter: Payer: Self-pay | Admitting: Pulmonary Disease

## 2018-08-03 DIAGNOSIS — R918 Other nonspecific abnormal finding of lung field: Secondary | ICD-10-CM | POA: Insufficient documentation

## 2018-08-05 ENCOUNTER — Telehealth: Payer: Self-pay

## 2018-08-05 NOTE — Telephone Encounter (Signed)
-----   Message from Star Age, MD sent at 08/05/2018  8:04 AM EDT ----- Patient referred by Dr. Sheppard Coil, seen by me on 07/02/18, diagnostic PSG on 07/31/18.   Please call and notify the patient that the recent sleep study did not show any significant obstructive sleep apnea, with the exception of snoring and supine-related sleep apnea. Weight loss and avoidance of the supine sleep position are recommended.  He was noted to have bouts of cough and lower average oxygen saturations during the night, during wakefulness and sleep, improved after supplemental O2 at 1 lpm. Please remind him, that he is strongly advised to quit smoking. Weight loss and optimization of his COPD management will likely help. He was recently seen by pulmonology and has PFTs pending. He may benefit from nocturnal supplemental O2. Please remind him to discuss further with his pulmonologist. He can FU with me prn.  Star Age, MD, PhD [facility])

## 2018-08-05 NOTE — Progress Notes (Signed)
Patient referred by Dr. Sheppard Coil, seen by me on 07/02/18, diagnostic PSG on 07/31/18.   Please call and notify the patient that the recent sleep study did not show any significant obstructive sleep apnea, with the exception of snoring and supine-related sleep apnea. Weight loss and avoidance of the supine sleep position are recommended.  He was noted to have bouts of cough and lower average oxygen saturations during the night, during wakefulness and sleep, improved after supplemental O2 at 1 lpm. Please remind him, that he is strongly advised to quit smoking. Weight loss and optimization of his COPD management will likely help. He was recently seen by pulmonology and has PFTs pending. He may benefit from nocturnal supplemental O2. Please remind him to discuss further with his pulmonologist. He can FU with me prn.  Star Age, MD, PhD [facility])

## 2018-08-05 NOTE — Procedures (Signed)
PATIENT'S NAME:  Anthony [name], Anthony [name] DOB:      1950/07/25      MR#:    [id number]     DATE OF RECORDING: 07/31/2018 REFERRING M.D.:  Emeterio Reeve, DO Study Performed:   Baseline Polysomnogram HISTORY: 68 year old man with a history of coronary artery disease, history of DVT, hypertension, hyperlipidemia, history of MI, smoking, COPD, arthritis, mood disorder including diagnosis of bipolar disorder, and obesity, who reports intermittent tremors, not sleeping well and nocturia. The patient's weight 212 pounds with a height of 69 (inches), resulting in a BMI of 31.3 kg/m2. The patient's neck circumference measured 17.5 inches.  CURRENT MEDICATIONS: Ventolin, ASA 81mg , Lipitor, Lioresal, Symbicort, Vit B-12, Voltaren, Flonase, Neurontin, Glucotrol, Duoneb, Lisinopril, Metformin, Toprol-XL, Senokot, Restoril, Ultram, Chantix   PROCEDURE:  This is a multichannel digital polysomnogram utilizing the Somnostar 11.2 system.  Electrodes and sensors were applied and monitored per AASM Specifications.   EEG, EOG, Chin and Limb EMG, were sampled at 200 Hz.  ECG, Snore and Nasal Pressure, Thermal Airflow, Respiratory Effort, CPAP Flow and Pressure, Oximetry was sampled at 50 Hz. Digital video and audio were recorded.      BASELINE STUDY  Lights Out was at 21:42 and Lights On at 04:24.  Total recording time (TRT) was 403 minutes, with a total sleep time (TST) of 322 minutes.   The patient's sleep latency to consistent sleep was 41 minutes, which is delayed. REM latency was 67.5 minutes.  The sleep efficiency was 79.9 %.     SLEEP ARCHITECTURE: WASO (Wake after sleep onset) was 70 minutes with mild to moderate sleep fragmentation noted. There were 26.5 minutes in Stage N1, 142 minutes Stage N2, 83.5 minutes Stage N3 and 70 minutes in Stage REM.  The percentage of Stage N1 was 8.2%, Stage N2 was 44.1%, Stage N3 was 25.9% and Stage R (REM sleep) was 21.7%, which is normal.   RESPIRATORY ANALYSIS:  There were a total  of 14 respiratory events:  8 obstructive apneas, 0 central apneas and 0 mixed apneas with a total of 8 apneas and an apnea index (AI) of 1.5 /hour. There were 6 hypopneas with a hypopnea index of 1.1 /hour. The patient also had 0 respiratory event related arousals (RERAs).      The total APNEA/HYPOPNEA INDEX (AHI) was 2.6 /hour and the total RESPIRATORY DISTURBANCE INDEX was 0. 2.6 /hour.  0 events occurred in REM sleep and 12 events in NREM. The REM AHI was 0 /hour, versus a non-REM AHI of 3.3. The patient spent 34 minutes of total sleep time in the supine position and 288 minutes in non-supine.. The supine AHI was 24.7 versus a non-supine AHI of 0.0.  OXYGEN SATURATION & C02:  The Wake baseline 02 saturation was 92%, with the lowest being 80%. Time spent below 89% saturation equaled 79 minutes. Due to average O2 saturations of 87-88%, the patient was started on supplemental oxygen at 23:21 (epoch 212), at 1 lpm, after which his average O2 saturation was 92%.  PERIODIC LIMB MOVEMENTS: The patient had a total of 88 Periodic Limb Movements.  The Periodic Limb Movement (PLM) index was 16.4 and the PLM Arousal index was .7/hour. The arousals were noted as: 12 were spontaneous, 4 were associated with PLMs, 4 were associated with respiratory events.  Audio and video analysis did not show any abnormal or unusual movements, behaviors, phonations or vocalizations. He had multiple bouts of loud and wheezy coughing, had to sit up multiple times and drank sips  of water. The patient took 2 bathroom breaks. Snoring was noted, ranging from mild to loud. The EKG was in keeping with normal sinus rhythm (NSR). Post-study, the patient indicated that sleep was the same as usual.   IMPRESSION:  1. Nocturnal hypoxemia 2. Nocturnal cough 3. Periodic Limb Movement Disorder (PLMD) 4. Primary Snoring 5. Dysfunctions associated with sleep stages or arousal from sleep  RECOMMENDATIONS:  1. This study does not demonstrate  any significant obstructive sleep apnea, with the exception of snoring and supine-related sleep apnea. Weight loss and avoidance of the supine sleep position are recommended.  2. He had numerous bouts of coughing and lower average oxygen saturations during the night, during wakefulness and sleep, improved after supplemental O2 at 1 lpm. The patient will be urged to stop smoking; weight loss and optimization of his COPD management will likely help. He was recently seen by pulmonology and has PFTs pending. He may benefit from nocturnal supplemental O2. He will be advised to discuss further with his pulmonologist. 3. Mild PLMs (periodic limb movements of sleep) were noted during this study without significant arousals; clinical correlation is recommended.  4. This study shows sleep fragmentation and but overall fairly normal sleep stage percentages; these are nonspecific findings and per se do not signify an intrinsic sleep disorder or a cause for the patient's sleep-related symptoms. Causes include (but are not limited to) the first night effect of the sleep study, circadian rhythm disturbances, medication effect or an underlying mood disorder or medical problem.  5. The patient should be cautioned not to drive, work at heights, or operate dangerous or heavy equipment when tired or sleepy. Review and reiteration of good sleep hygiene measures should be pursued with any patient.  6. The patient and his referring provider will be notified of the test results.  I certify that I have reviewed the entire raw data recording prior to the issuance of this report in accordance with the Standards of Accreditation of the American Academy of Sleep Medicine (AASM)  Star Age, MD, PhD Diplomat, American Board of Neurology and Sleep Medicine (Neurology and Sleep Medicine)

## 2018-08-05 NOTE — Telephone Encounter (Signed)
I called pt to discuss his sleep study results. No answer, left a message asking him to call me back. 

## 2018-08-07 NOTE — Telephone Encounter (Signed)
Pt returned my call. I discussed his sleep study results and recommendations. Pt will follow up with his pulmonologist and Dr. Sheppard Coil, and with [facility] as needed. Pt verbalized understanding of results and recommendations. Pt had no questions at this time but was encouraged to call back if questions arise.

## 2018-08-07 NOTE — Telephone Encounter (Signed)
I called pt to discuss his sleep study results. No answer, left a message asking him to call me back. 

## 2018-08-12 ENCOUNTER — Other Ambulatory Visit: Payer: Self-pay

## 2018-08-12 MED ORDER — GLIPIZIDE 10 MG PO TABS: 10.0000 mg | ORAL_TABLET | Freq: Two times a day (BID) | ORAL | 1 refills | Status: DC

## 2018-08-13 ENCOUNTER — Telehealth: Payer: Self-pay

## 2018-08-13 NOTE — Telephone Encounter (Signed)
08/13/18 - called pt to gather financial information for the [facility] pt assistance program application for [employer]. Pt does not have tax forms, but stated he will look for W2 and email in when he finds it.

## 2018-08-20 NOTE — Telephone Encounter (Signed)
F/u with patient regarding financial documents needed for [employer] patient assistance application. Patient stated he had still not found his W2, but had the email address to send it to once it is located.

## 2018-08-29 ENCOUNTER — Ambulatory Visit: Payer: Medicare Other | Admitting: Family Medicine

## 2018-08-29 ENCOUNTER — Ambulatory Visit (INDEPENDENT_AMBULATORY_CARE_PROVIDER_SITE_OTHER): Payer: Medicare Other | Admitting: Physician Assistant

## 2018-08-29 ENCOUNTER — Encounter: Payer: Self-pay | Admitting: Physician Assistant

## 2018-08-29 ENCOUNTER — Other Ambulatory Visit: Payer: Self-pay

## 2018-08-29 VITALS — BP 108/65 | HR 75 | Temp 97.8°F | Ht 69.0 in | Wt 210.0 lb

## 2018-08-29 DIAGNOSIS — R531 Weakness: Secondary | ICD-10-CM | POA: Diagnosis not present

## 2018-08-29 DIAGNOSIS — F39 Unspecified mood [affective] disorder: Secondary | ICD-10-CM

## 2018-08-29 DIAGNOSIS — M7918 Myalgia, other site: Secondary | ICD-10-CM | POA: Diagnosis not present

## 2018-08-29 DIAGNOSIS — E559 Vitamin D deficiency, unspecified: Secondary | ICD-10-CM | POA: Diagnosis not present

## 2018-08-29 DIAGNOSIS — R5382 Chronic fatigue, unspecified: Secondary | ICD-10-CM

## 2018-08-29 DIAGNOSIS — R7989 Other specified abnormal findings of blood chemistry: Secondary | ICD-10-CM | POA: Diagnosis not present

## 2018-08-29 DIAGNOSIS — Z125 Encounter for screening for malignant neoplasm of prostate: Secondary | ICD-10-CM | POA: Diagnosis not present

## 2018-08-29 DIAGNOSIS — R748 Abnormal levels of other serum enzymes: Secondary | ICD-10-CM | POA: Diagnosis not present

## 2018-08-29 MED ORDER — MIRTAZAPINE 15 MG PO TABS: 15.0000 mg | ORAL_TABLET | Freq: Every day | ORAL | 0 refills | Status: DC

## 2018-08-29 NOTE — Patient Instructions (Addendum)
Crisis Services for [facility] are managed by: [facility] A CHOICE ABOUT HOW TO GET SERVICES WHEN YOU ARE IN A CRISIS Phone First. [facility] is available 24 hours a day, 7 days a week. Customer Service Specialists will assist you to find a crisis provider that is well-matched with your needs. Your local number is: [phone] If you already have a service provider, call them first. Providers who know you are usually best prepared to assist you in a crisis. Have Support Come to You. Crisis situations are often best resolved at home. Mobile Crisis Teams are available 24 hours a day in all counties. Professional counselors will speak with you and your family during a visit. They have an average response time of 2 hours. This service is provided by: [facility] [phone] Go To [place]. Many counties have a specialized crisis center where you can walk in for a crisis assessment and referrals to additional services. Appointments are not needed. The crisis center in your county is provided by: [facility] [address], [place], [place] [address] [phone] Monday -- Friday - 8:00 a.m. - 5:00 p.m.  Safety Plan: if having self-harm or suicidal thoughts, contact  [place] 1-800-SUICIDE If in immediate danger of harming yourself, go to the nearest emergency room or call 911   Living With Depression Everyone experiences occasional disappointment, sadness, and loss in their lives. When you are feeling down, blue, or sad for at least 2 weeks in a row, it may mean that you have depression. Depression can affect your thoughts and feelings, relationships, daily activities, and physical health. It is caused by changes in the way your brain functions. If you receive a diagnosis of depression, your health  care provider will tell you which type of depression you have and what treatment options are available to you. If you are living with depression, there are ways to help you recover from it and also ways to prevent it from coming back. How to cope with lifestyle changes Coping with stress     Stress is your body's reaction to life changes and events, both good and bad. Stressful situations may include:  Getting married.  The death of a spouse.  Losing a job.  Retiring.  Having a baby. Stress can last just a few hours or it can be ongoing. Stress can play a major role in depression, so it is important to learn both how to cope with stress and how to think about it differently. Talk with your health care provider or a counselor if you would like to learn more about stress reduction. He or she may suggest some stress reduction techniques, such as:  Music therapy. This can include creating music or listening to music. Choose music that you enjoy and that inspires you.  Mindfulness-based meditation. This kind of meditation can be done while sitting or walking. It involves being aware of your normal breaths, rather than trying to control your breathing.  Centering prayer. This is a kind of meditation that involves focusing on a spiritual word or phrase. Choose a word, phrase, or sacred image that is meaningful to you and that brings you peace.  Deep breathing. To do this, expand your stomach and inhale slowly through your nose. Hold your breath for 3-5 seconds, then exhale slowly, allowing your stomach muscles to relax.  Muscle relaxation. This involves intentionally tensing muscles then  relaxing them. Choose a stress reduction technique that fits your lifestyle and personality. Stress reduction techniques take time and practice to develop. Set aside 5-15 minutes a day to do them. Therapists can offer training in these techniques. The training may be covered by some insurance plans. Other  things you can do to manage stress include:  Keeping a stress diary. This can help you learn what triggers your stress and ways to control your response.  Understanding what your limits are and saying no to requests or events that lead to a schedule that is too full.  Thinking about how you respond to certain situations. You may not be able to control everything, but you can control how you react.  Adding humor to your life by watching funny films or TV shows.  Making time for activities that help you relax and not feeling guilty about spending your time this way.  Medicines Your health care provider may suggest certain medicines if he or she feels that they will help improve your condition. Avoid using alcohol and other substances that may prevent your medicines from working properly (may interact). It is also important to:  Talk with your pharmacist or health care provider about all the medicines that you take, their possible side effects, and what medicines are safe to take together.  Make it your goal to take part in all treatment decisions (shared decision-making). This includes giving input on the side effects of medicines. It is best if shared decision-making with your health care provider is part of your total treatment plan. If your health care provider prescribes a medicine, you may not notice the full benefits of it for 4-8 weeks. Most people who are treated for depression need to be on medicine for at least 6-12 months after they feel better. If you are taking medicines as part of your treatment, do not stop taking medicines without first talking to your health care provider. You may need to have the medicine slowly decreased (tapered) over time to decrease the risk of harmful side effects. Relationships Your health care provider may suggest family therapy along with individual therapy and drug therapy. While there may not be family problems that are causing you to feel depressed, it  is still important to make sure your family learns as much as they can about your mental health. Having your family's support can help make your treatment successful. How to recognize changes in your condition Everyone has a different response to treatment for depression. Recovery from major depression happens when you have not had signs of major depression for two months. This may mean that you will start to:  Have more interest in doing activities.  Feel less hopeless than you did 2 months ago.  Have more energy.  Overeat less often, or have better or improving appetite.  Have better concentration. Your health care provider will work with you to decide the next steps in your recovery. It is also important to recognize when your condition is getting worse. Watch for these signs:  Having fatigue or low energy.  Eating too much or too little.  Sleeping too much or too little.  Feeling restless, agitated, or hopeless.  Having trouble concentrating or making decisions.  Having unexplained physical complaints.  Feeling irritable, angry, or aggressive. Get help as soon as you or your family members notice these symptoms coming back. How to get support and help from others How to talk with friends and family members about your condition  Talking  to friends and family members about your condition can provide you with one way to get support and guidance. Reach out to trusted friends or family members, explain your symptoms to them, and let them know that you are working with a health care provider to treat your depression. Financial resources Not all insurance plans cover mental health care, so it is important to check with your insurance carrier. If paying for co-pays or counseling services is a problem, search for a local or county mental health care center. They may be able to offer public mental health care services at low or no cost when you are not able to see a private health care  provider. If you are taking medicine for depression, you may be able to get the generic form, which may be less expensive. Some makers of prescription medicines also offer help to patients who cannot afford the medicines they need. Follow these instructions at home:   Get the right amount and quality of sleep.  Cut down on using caffeine, tobacco, alcohol, and other potentially harmful substances.  Try to exercise, such as walking or lifting small weights.  Take over-the-counter and prescription medicines only as told by your health care provider.  Eat a healthy diet that includes plenty of vegetables, fruits, whole grains, low-fat dairy products, and lean protein. Do not eat a lot of foods that are high in solid fats, added sugars, or salt.  Keep all follow-up visits as told by your health care provider. This is important. Contact a health care provider if:  You stop taking your antidepressant medicines, and you have any of these symptoms: ? Nausea. ? Headache. ? Feeling lightheaded. ? Chills and body aches. ? Not being able to sleep (insomnia).  You or your friends and family think your depression is getting worse. Get help right away if:  You have thoughts of hurting yourself or others. If you ever feel like you may hurt yourself or others, or have thoughts about taking your own life, get help right away. You can go to your nearest emergency department or call:  Your local emergency services (911 in the [place]  A suicide crisis helpline, such as the [place] at (515)789-7834. This is open 24-hours a day. Summary  If you are living with depression, there are ways to help you recover from it and also ways to prevent it from coming back.  Work with your health care team to create a management plan that includes counseling, stress management techniques, and healthy lifestyle habits. This information is not intended to replace advice given to you by  your health care provider. Make sure you discuss any questions you have with your health care provider. Document Released: 11/28/2015 Document Revised: 04/18/2018 Document Reviewed: 11/28/2015 Elsevier Patient Education  2020 [employer].

## 2018-08-29 NOTE — Progress Notes (Signed)
HPI:                                                                Anthony [name]. is a 68 y.o. male who presents to [facility] today for fatigue/weakness/depression  Patient reports fatigue, weakness and depressed mood for 3-4 months, gradually worsening for the last week or so.   "I've gotten to where I'm completely wore out. I feel weaker and weaker at an accelerated pace. Lately this past week I've been losing my mind. I'm panicked and frozen. I just don't feel like doing anything. I've started feeling so weak I've just given up"  Reports he has lost his endurance and has to take frequent breaks to walk a quarter of a mile. He used to be able to walk a quarter mile 2-3 times per day without taking a break. Denies arthralgia, myalgia, muscle weakness, gait disturbance, clumsiness or falls. Denies fever, unintended weight loss.  He endorses depressed mood and anhedonia.  Reports feeling tired all of the time. Endorses sleep disturbance where he is unable to sleep at night and sleeps during the day from 5/6am-2/3pm. Per sleep Medicine "recent sleep study did not show any significant obstructive sleep apnea, with the exception of snoring and supine-related sleep apnea   Admits to suicidal thoughts. He is reluctant to talk about this because of past experience. He states the sheriff brings you to the hospital and no one does anything and its just a waste of his time and their time. He denies forming a plan, no guns at home, no recent self-harming behaviors.   Lives alone. Daughter nearby in [place] and they have a good relationship, but he feels that she dismisses most of his concerns.  Past Psychiatric History:  Reports he has been treated for anxiety and depression in the past. He has been hospitalized many years ago.  Previous suicide attempt: Patient has a history of suicidal attempt in 1995 when he overdosed on sleeping  pills. Past trials of medication: Wellbutrin, Cymbalta, sertraline, Trazodone, Doxepin, Remeron, Hydroxyzine, Alprazolam Denies any alcohol use, reports he had a problem with alcohol 20-30 years ago Denies illicit drug use   Depression screen [facility] 2/9 08/29/2018 03/12/2018 02/17/2018 02/11/2018 01/16/2018  Decreased Interest _0 Down, Depressed, Hopeless 2 0 _1 PHQ - 2 Score _2 Altered sleeping _3 Tired, decreased energy _4 Change in appetite 3 - 3 0 3  Feeling bad or failure about yourself  _5 Trouble concentrating _6 0 3  Moving slowly or fidgety/restless 1 0 0 0 0  Suicidal thoughts 1 0 0 2 0  PHQ-9 Score _7 Difficult doing work/chores Extremely dIfficult Not difficult at all Somewhat difficult Very difficult Very difficult  Some recent data might be hidden    GAD 7 : Generalized Anxiety Score 08/29/2018 03/12/2018 02/17/2018 02/11/2018  Nervous, Anxious, on Edge _8 Control/stop worrying _9 Worry too much - different things _10 Trouble relaxing 3 1 0 2  Restless  _0 Easily annoyed or irritable 2 0 2 1  Afraid - awful might happen 2 0 0 1  Total GAD 7 Score _1 Anxiety Difficulty Very difficult Somewhat difficult Somewhat difficult Somewhat difficult      Past Medical History:  Diagnosis Date  . Anxiety   . Arthritis   . Bipolar disorder ([place])   . CAD (coronary artery disease)    a. INF STEMI 07/04/10:  tx with thrombectomy + [facility];  cath 07/04/10: dLM 10-20%, pLAD 40-50%, mLAD 20-30%, pRCA 30%, mRCA occluded and tx with PCI, EF 50% with inf HK. A Multilink  . COPD (chronic obstructive pulmonary disease) ([place])   . Depression   . Glucose intolerance (impaired glucose tolerance)    A1c 6.2 06/2010  . History of DVT (deep vein thrombosis)    traumatic, s/p coumadin tx.  Marland Kitchen HLD (hyperlipidemia)   . Hypertension    pt states is currently on no medications  . Myocardial infarction ([place])     around 2013  . Shortness of breath    walking distance or climbing stairs  . Tobacco abuse   . Urinary frequency    Past Surgical History:  Procedure Laterality Date  . CARDIAC CATHETERIZATION    . COLONOSCOPY N/A 12/29/2015   Procedure: COLONOSCOPY;  Surgeon: Rogene Houston, MD;  Location[facility];  Service: Endoscopy;  Laterality: N/A;  12:55  . ELECTROCARDIOGRAM     Showed inferolateral ST-elevation and a code STEMI was activated. In the ER, he was treated with morphine, herarin, and 600 mg of Plavix. He was transferred emergently to [facility].   . INGUINAL HERNIA REPAIR Left 07/26/2014   Procedure: OPEN REPAIR OF RECURENT LEFT INGUINAL HERNIA REPAIR WITH MESH;  Surgeon: Greer Pickerel, MD;  Location: [facility] OR[facility];  Service: General;  Laterality: Left;  . INSERTION OF MESH Bilateral 10/30/2013   Procedure: INSERTION OF MESH;  Surgeon: Gayland Curry, MD;  Location: [facility];  Service: General;  Laterality: Bilateral;  . LAPAROSCOPIC INGUINAL HERNIA WITH UMBILICAL HERNIA Bilateral 10/30/2013   Procedure: laparoscopic repair left pantaloom hernia with mesh, laparoscopic right inguinal hernia with mesh, OPEN REPAIR OF UMBILICAL HERNIA ;  Surgeon: Gayland Curry, MD;  Location: [facility];  Service: General;  Laterality: Bilateral;  . LAPAROSCOPY N/A 07/26/2014   Procedure: LAPAROSCOPY DIAGNOSTIC;  Surgeon: Greer Pickerel, MD;  Location: [facility];  Service: General;  Laterality: N/A;  . POLYPECTOMY  12/29/2015   Procedure: POLYPECTOMY;  Surgeon: Rogene Houston, MD;  Location[facility];  Service: Endoscopy;;  ascending colon, descending colon  . stent placement    . TOTAL HIP ARTHROPLASTY Right 05/10/2015   Procedure: RIGHT TOTAL HIP ARTHROPLASTY ANTERIOR APPROACH;  Surgeon: Paralee Cancel, MD;  Location: [facility];  Service: Orthopedics;  Laterality: Right;  Marland Kitchen VASECTOMY     Social History   Tobacco Use  . Smoking status: Current Every Day Smoker    Packs/day: 1.00    Years: 45.00    Pack  years: 45.00    Types: Cigarettes  . Smokeless tobacco: Never Used  Substance Use Topics  . Alcohol use: No    Comment: past hx of ETOH use was in inpt facility per court order  - 20 years, 10-18-2015 per pt not anymore,per pt stopped about 15-20 yrs ago   family history includes Coronary artery disease in his father; Depression in his father and mother; Heart attack  in his father.    Review of Systems  Constitutional: Positive for appetite change (skipping meals), chills and fatigue. Negative for fever.  Respiratory:       + COPD  Cardiovascular: Negative for chest pain and leg swelling.  Skin:       + diaphoresis  Neurological: Positive for weakness.  Psychiatric/Behavioral: Positive for decreased concentration, dysphoric mood, sleep disturbance and suicidal ideas. Negative for hallucinations and self-injury. The patient is nervous/anxious.   All other systems reviewed and are negative.    Medications: Current Outpatient Medications  Medication Sig Dispense Refill  . albuterol (PROVENTIL HFA;VENTOLIN HFA) 108 (90 Base) MCG/ACT inhaler Inhale 2 puffs into the lungs every 4 (four) hours as needed for wheezing or shortness of breath (AS NEEDED - RESCUE INHALER). 1 Inhaler 1  . aspirin EC 81 MG tablet Take 1 tablet (81 mg total) by mouth daily. 90 tablet 3  . atorvastatin (LIPITOR) 40 MG tablet Take 1 tablet (40 mg total) by mouth daily. 90 tablet 0  . baclofen (LIORESAL) 10 MG tablet Take 1 tablet (10 mg total) by mouth 2 (two) times daily. 180 tablet 0  . blood glucose meter kit and supplies KIT Dispense based on patient and insurance preference. Use to check BG once daily or every other other.  Dx: E11.9 1 each 0  . diclofenac (VOLTAREN) 75 MG EC tablet Take 1 tablet (75 mg total) by mouth 2 (two) times daily. 60 tablet 2  . fluticasone (FLONASE) 50 MCG/ACT nasal spray Place 2 sprays into both nostrils daily. 16 g 6  . gabapentin (NEURONTIN) 300 MG capsule Take 1 capsule (300 mg  total) by mouth 3 (three) times daily. AS NEEDED FOR NERVE PAIN 180 capsule 3  . glipiZIDE (GLUCOTROL) 10 MG tablet Take 1 tablet (10 mg total) by mouth 2 (two) times daily before a meal. 180 tablet 1  . ipratropium-albuterol (DUONEB) 0.5-2.5 (3) MG/3ML SOLN Take 3 mLs by nebulization every 4 (four) hours as needed (wheeze, cough, short of breath. Max use 6 vials per day). 360 mL 99  . lisinopril (PRINIVIL,ZESTRIL) 5 MG tablet Take 1 tablet (5 mg total) by mouth daily. 90 tablet 3  . metFORMIN (GLUCOPHAGE-XR) 500 MG 24 hr tablet Take 2 tablets (1,000 mg total) by mouth daily with breakfast. Short term until mail order arrives 60 tablet 0  . metoprolol succinate (TOPROL-XL) 25 MG 24 hr tablet Take 0.5 tablets (12.5 mg total) by mouth daily. 90 tablet 0  . Nebulizers (NEBULIZER COMPRESSOR) MISC Per insurance coverage and patient preference. Dx COPD 1 each 1  . Respiratory Therapy Supplies (NEBULIZER/TUBING/MOUTHPIECE) KIT Per insurance coverage and patient preference. Dx COPD 1 each 17  . sennosides-docusate sodium (SENOKOT-S) 8.6-50 MG tablet Take 1 tablet by mouth 3 (three) times daily as needed for constipation.     . sildenafil (VIAGRA) 100 MG tablet Take 0.5-1 tablets (50-100 mg total) by mouth daily as needed for erectile dysfunction. 10 tablet 11  . temazepam (RESTORIL) 15 MG capsule Take 1 capsule (15 mg total) by mouth at bedtime as needed for sleep. 90 capsule 1  . Tiotropium Bromide Monohydrate (SPIRIVA RESPIMAT) 2.5 MCG/ACT AERS Inhale 2 puffs into the lungs daily. 4 g 0   No current facility-administered medications for this visit.    Allergies  Allergen Reactions  . Flomax [Tamsulosin Hcl] Other (See Comments)    This medication makes patient feel sick  . Penicillins Other (See Comments)    Reaction unknown occurred during childhood  Has patient had a PCN reaction causing immediate rash, facial/tongue/throat swelling, SOB or lightheadedness with hypotension: unsure - childhood  reaction Has patient had a PCN reaction causing severe rash involving mucus membranes or skin necrosis: unsure - childhood reaction Has patient had a PCN reaction that required hospitalization no Has patient had a PCN reaction occurring within the last 10 years: no If all of the above answers are "NO", then may p       Objective:  BP 108/65   Pulse 75   Temp 97.8 F (36.6 C)   Ht _0  (1.753 m)   Wt 210 lb (95.3 kg)   SpO2 92%   BMI 31.01 kg/m  Gen: disheveled appearing, not ill-appearing, no acute distress, obese male HEENT: head normocephalic, atraumatic; conjunctiva and cornea clear, oropharynx clear, moist mucus membranes; neck supple, ROM intact Pulm: Normal work of breathing, normal phonation, clear to auscultation bilaterally CV: Normal rate, regular rhythm, s1 and s2 distinct, no murmurs, clicks or rubs Neuro:  cranial nerves II-XII intact, no nystagmus, normal finger-to-nose, normal heel-to-shin, normal rapid alternating movements, normal tone, no tremor MSK: strength 5/5 and symmetric in bilateral upper and lower extremities, normal gait and station Mental Status: alert and oriented x 3, speech articulate, and thought processes clear and goal-directed Psych: cooperative, depressed mood, affect mood-congruent, speech is articulate, normal rate and volume; thought processes clear and goal-directed, normal judgment, good insight, not responding to internal stimuli, no SI/HI    Recent Results (from the past 2160 hour(s))  TSH     Status: None   Collection Time: 07/02/18 10:51 AM  Result Value Ref Range   TSH 1.680 0.450 - 4.500 uIU/mL  Comprehensive metabolic panel     Status: Abnormal   Collection Time: 07/02/18 10:51 AM  Result Value Ref Range   Glucose 124 (H) 65 - 99 mg/dL   BUN 19 8 - 27 mg/dL   Creatinine, Ser 1.14 0.76 - 1.27 mg/dL   GFR calc non Af Amer 65 >59 mL/min/1.73   GFR calc Af Amer 75 >59 mL/min/1.73   BUN/Creatinine Ratio 17 10 - 24   Sodium 141  134 - 144 mmol/L   Potassium 5.1 3.5 - 5.2 mmol/L   Chloride 102 96 - 106 mmol/L   CO2 20 20 - 29 mmol/L   Calcium 9.6 8.6 - 10.2 mg/dL   Total Protein 7.6 6.0 - 8.5 g/dL   Albumin 4.7 3.8 - 4.8 g/dL   Globulin, Total 2.9 1.5 - 4.5 g/dL   Albumin/Globulin Ratio 1.6 1.2 - 2.2   Bilirubin Total 0.2 0.0 - 1.2 mg/dL   Alkaline Phosphatase 74 39 - 117 IU/L   AST 24 0 - 40 IU/L   ALT 39 0 - 44 IU/L  POCT HgB A1C     Status: Abnormal   Collection Time: 07/14/18  2:03 PM  Result Value Ref Range   Hemoglobin A1C 7.9 (A) 4.0 - 5.6 %   HbA1c POC (<> result, manual entry)     HbA1c, POC (prediabetic range)     HbA1c, POC (controlled diabetic range)      No results found for this or any previous visit (from the past 72 hour(s)). No results found.    Assessment and Plan: 68 y.o. male with   .Kamdin was seen today for fatigue.  Diagnoses and all orders for this visit:  Mood disorder ([place]) -     mirtazapine (REMERON) 15 MG tablet; Take 1 tablet (15 mg total) by mouth  at bedtime.  Chronic fatigue -     C-reactive protein -     Ferritin -     Sedimentation rate -     Vitamin B12 -     Vit D  25 hydroxy (rtn osteoporosis monitoring) -     CBC with Differential/Platelet -     TSH + free T4 -     COMPLETE METABOLIC PANEL WITH GFR  Generalized weakness -     C-reactive protein -     Ferritin -     Sedimentation rate -     Vitamin B12 -     Vit D  25 hydroxy (rtn osteoporosis monitoring) -     CBC with Differential/Platelet -     TSH + free T4 -     COMPLETE METABOLIC PANEL WITH GFR  Screening PSA (prostate specific antigen) -     PSA, Total with Reflex to PSA, Free    Mood disorder   Office Visit from 08/29/2018 in [place]  PHQ-9 Total Score  22    Patient endorses passive SI, he was verbally contracted for safety, he declines transport to hospital and does not wish to be admitted Reviewed written safety plan with patient and crisis  phone numbers I reviewed his last psychiatry note and it appears Remeron was the most recent antidepressant that he was stable on. Patient does not recall any of his prior meds or what was effective for him Re-start Remeron 15 mg at bedtime Close follow-up in 1 week  Generalized weakness, Fatigue Reassuring neuro/MSK exam Lab work-up pending Likely multifactorial due to uncontrolled mood disorder, sleep disorder, uncontrolled diabetes and COPD/nocturnal hypoxemia  Patient education and anticipatory guidance given Patient agrees with treatment plan Follow-up in 1 week or sooner as needed if symptoms worsen or fail to improve  I spent 40 minutes with this patient, greater than 50% was face-to-face time counseling regarding the above diagnoses    Darlyne Russian PA-C

## 2018-08-29 NOTE — Progress Notes (Deleted)
Pt

## 2018-09-01 LAB — COMPLETE METABOLIC PANEL WITH GFR
AG Ratio: 1.4 (calc) (ref 1.0–2.5)
ALT: 29 U/L (ref 9–46)
AST: 22 U/L (ref 10–35)
Albumin: 4.2 g/dL (ref 3.6–5.1)
Alkaline phosphatase (APISO): 94 U/L (ref 35–144)
BUN: 18 mg/dL (ref 7–25)
CO2: 26 mmol/L (ref 20–32)
Calcium: 9.5 mg/dL (ref 8.6–10.3)
Chloride: 98 mmol/L (ref 98–110)
Creat: 1.13 mg/dL (ref 0.70–1.25)
GFR, Est African American: 76 mL/min/{1.73_m2} (ref >=60)
GFR, Est Non African American: 66 mL/min/{1.73_m2} (ref >=60)
Globulin: 2.9 g/dL (calc) (ref 1.9–3.7)
Glucose, Bld: 245 mg/dL — ABNORMAL HIGH (ref 65–99)
Potassium: 4.5 mmol/L (ref 3.5–5.3)
Sodium: 134 mmol/L — ABNORMAL LOW (ref 135–146)
Total Bilirubin: 0.5 mg/dL (ref 0.2–1.2)
Total Protein: 7.1 g/dL (ref 6.1–8.1)

## 2018-09-01 LAB — CBC WITH DIFFERENTIAL/PLATELET
Absolute Monocytes: 899 cells/uL (ref 200–950)
Basophils Absolute: 67 cells/uL (ref 0–200)
Basophils Relative: 0.6 %
Eosinophils Absolute: 111 cells/uL (ref 15–500)
Eosinophils Relative: 1 %
HCT: 48.6 % (ref 38.5–50.0)
Hemoglobin: 16.7 g/dL (ref 13.2–17.1)
Lymphs Abs: 1998 cells/uL (ref 850–3900)
MCH: 31 pg (ref 27.0–33.0)
MCHC: 34.4 g/dL (ref 32.0–36.0)
MCV: 90.2 fL (ref 80.0–100.0)
MPV: 9 fL (ref 7.5–12.5)
Monocytes Relative: 8.1 %
Neutro Abs: 8025 cells/uL — ABNORMAL HIGH (ref 1500–7800)
Neutrophils Relative %: 72.3 %
Platelets: 206 10*3/uL (ref 140–400)
RBC: 5.39 10*6/uL (ref 4.20–5.80)
RDW: 12.4 % (ref 11.0–15.0)
Total Lymphocyte: 18 %
WBC: 11.1 10*3/uL — ABNORMAL HIGH (ref 3.8–10.8)

## 2018-09-01 LAB — FERRITIN: Ferritin: 59 ng/mL (ref 24–380)

## 2018-09-01 LAB — VITAMIN D 25 HYDROXY (VIT D DEFICIENCY, FRACTURES): Vit D, 25-Hydroxy: 19 ng/mL — ABNORMAL LOW (ref 30–100)

## 2018-09-01 LAB — VITAMIN B12: Vitamin B-12: 1605 pg/mL — ABNORMAL HIGH (ref 200–1100)

## 2018-09-01 LAB — C-REACTIVE PROTEIN: CRP: 17 mg/L — ABNORMAL HIGH (ref <8.0)

## 2018-09-01 LAB — TSH+FREE T4: TSH W/REFLEX TO FT4: 1.38 mIU/L (ref 0.40–4.50)

## 2018-09-01 LAB — PSA, TOTAL WITH REFLEX TO PSA, FREE: PSA, Total: 0.3 ng/mL (ref <=4.0)

## 2018-09-01 LAB — SEDIMENTATION RATE: Sed Rate: 19 mm/h (ref 0–20)

## 2018-09-05 ENCOUNTER — Encounter: Payer: Self-pay | Admitting: Physician Assistant

## 2018-09-05 ENCOUNTER — Ambulatory Visit: Payer: Medicare Other | Admitting: Physician Assistant

## 2018-09-05 DIAGNOSIS — E559 Vitamin D deficiency, unspecified: Secondary | ICD-10-CM

## 2018-09-05 DIAGNOSIS — R7989 Other specified abnormal findings of blood chemistry: Secondary | ICD-10-CM | POA: Insufficient documentation

## 2018-09-05 DIAGNOSIS — R748 Abnormal levels of other serum enzymes: Secondary | ICD-10-CM | POA: Insufficient documentation

## 2018-09-05 DIAGNOSIS — D72828 Other elevated white blood cell count: Secondary | ICD-10-CM | POA: Insufficient documentation

## 2018-09-05 HISTORY — DX: Vitamin D deficiency, unspecified: E55.9

## 2018-09-08 ENCOUNTER — Other Ambulatory Visit: Payer: Self-pay

## 2018-09-08 ENCOUNTER — Encounter: Payer: Self-pay | Admitting: Physician Assistant

## 2018-09-08 ENCOUNTER — Ambulatory Visit (INDEPENDENT_AMBULATORY_CARE_PROVIDER_SITE_OTHER): Payer: Medicare Other

## 2018-09-08 ENCOUNTER — Ambulatory Visit (INDEPENDENT_AMBULATORY_CARE_PROVIDER_SITE_OTHER): Payer: Medicare Other | Admitting: Physician Assistant

## 2018-09-08 VITALS — BP 119/80 | HR 89 | Temp 98.3°F | Wt 210.0 lb

## 2018-09-08 DIAGNOSIS — J9811 Atelectasis: Secondary | ICD-10-CM | POA: Diagnosis not present

## 2018-09-08 DIAGNOSIS — R61 Generalized hyperhidrosis: Secondary | ICD-10-CM | POA: Diagnosis not present

## 2018-09-08 DIAGNOSIS — R590 Localized enlarged lymph nodes: Secondary | ICD-10-CM

## 2018-09-08 DIAGNOSIS — F39 Unspecified mood [affective] disorder: Secondary | ICD-10-CM

## 2018-09-08 DIAGNOSIS — D72829 Elevated white blood cell count, unspecified: Secondary | ICD-10-CM | POA: Diagnosis not present

## 2018-09-08 DIAGNOSIS — E559 Vitamin D deficiency, unspecified: Secondary | ICD-10-CM

## 2018-09-08 MED ORDER — ERGOCALCIFEROL 1.25 MG (50000 UT) PO CAPS: ORAL_CAPSULE | ORAL | 1 refills | Status: DC

## 2018-09-08 MED ORDER — MIRTAZAPINE 30 MG PO TABS: 15.0000 mg | ORAL_TABLET | Freq: Every day | ORAL | 0 refills | Status: DC

## 2018-09-08 NOTE — Patient Instructions (Signed)
Leukocytosis Leukocytosis means that a person has more white blood cells than normal. White blood cells are made in the bone marrow. Bone marrow is the spongy tissue inside bones. The main job of white blood cells is to fight infection. Having too many white blood cells is a common condition. It can develop as a result of many types of medical problems. What are the causes? Leukocytosis may be caused by various conditions. In some cases, the bone marrow is normal but is still making too many white blood cells. This can be due to:  Infection.  Injury.  Physical stress.  Emotional stress.  Surgery.  Allergic reactions.  Tumors that do not start in the blood or bone marrow.  An inherited disease.  Certain medicines.  Pregnancy and labor. In other cases, a person may have a bone marrow disorder that is causing the body to make too many white blood cells. Bone marrow disorders include:  Leukemia. This is a type of blood cancer.  Myeloproliferative disorders. These disorders cause blood cells to grow abnormally. What are the signs or symptoms? Often, this condition causes no symptoms. Some people may have symptoms due to the medical condition that is causing their leukocytosis. These symptoms may include:  Bleeding.  Bruising.  Fever.  Night sweats.  Swollen lymph nodes.  An enlarged spleen.  Repeated infections.  Weakness.  Weight loss. How is this diagnosed? This condition is diagnosed with blood tests. It is often found when blood is tested as part of a routine physical exam. You may have other tests to help determine why you have too many white blood cells. These tests may include:  A complete blood count (CBC). This test measures all the types of blood cells in your body.  Chest X-rays, urine tests, or other tests to look for signs of infection.  Bone marrow aspiration. For this test, a needle is put into your bone. Cells from the bone marrow are removed  through the needle and examined under a microscope.  Other tests on the blood or bone marrow sample.  CT scan, bone scan, or other imaging tests. How is this treated? Usually, treatment is not needed for leukocytosis. However, if an infection, cancer, bone marrow disorder, or other serious problem is causing your leukocytosis, it will need to be treated. Treatment may include:  Regular monitoring of your white blood cell count to look for changes.  Antibiotic medicine if you have a bacterial infection.  Bone marrow transplant. This treatment replaces your diseased bone marrow with healthy cells that will grow new bone marrow.  Chemotherapy or biological therapies such as the use of antibodies. These treatments may be used to kill cancer cells or to decrease the number of white blood cells. Follow these instructions at home: Medicines  Take over-the-counter and prescription medicines only as told by your health care provider.  If you were prescribed an antibiotic medicine, take it as told by your health care provider. Do not stop taking the antibiotic even if you start to feel better. Eating and drinking   Eat foods that are low in saturated fats and high in fiber. Eat plenty of fruits and vegetables.  Drink enough fluid to keep your urine pale yellow.  Limit your intake of caffeine and alcohol. General instructions  Maintain a healthy weight. Ask your health care provider what weight is best for you.  Do 30 minutes of exercise at least 5 times each week. Check with your health care provider before you   start a new exercise routine.  Follow any safety precautions as told by your health care provider. This may be needed if you are at increased risk for infection or bleeding because of your condition.  Do not use any products that contain nicotine or tobacco, such as cigarettes, e-cigarettes, and chewing tobacco. If you need help quitting, ask your health care provider.  Keep all  follow-up visits as told by your health care provider. This is important. Contact a health care provider if you:  Feel weak or more tired than usual.  Develop chills, a cough, or nasal congestion.  Have a fever.  Lose weight without trying.  Have night sweats.  Bruise easily.  Have new or worsening symptoms. Get help right away if you:  Bleed more than normal.  Have chest pain.  Have trouble breathing.  Have uncontrolled nausea or vomiting.  Feel dizzy or light-headed. Summary  Leukocytosis means that a person has more white blood cells than normal.  This condition often causes no symptoms.  This condition may be caused by various conditions.  If an infection, cancer, bone marrow disorder, or other serious problem is causing your leukocytosis, it will need to be treated.  Keep all follow-up visits as told by your health care provider. This is important. This information is not intended to replace advice given to you by your health care provider. Make sure you discuss any questions you have with your health care provider. Document Released: 12/14/2010 Document Revised: 09/19/2017 Document Reviewed: 09/19/2017 Elsevier Patient Education  2020 [employer].

## 2018-09-08 NOTE — Progress Notes (Signed)
HPI:                                                                Anthony [name]. is a 68 y.o. male who presents to [facility] [place] today for fatigue/weakness/depression   Interval hx 09/08/18 Reports he is still feeling miserable both emotionally and physically Endorses nightsweats approx 75% of the time for the last month He has had a cough that is occasionally productive of green mucus for the last 3 months Reports he is smoking more (approx 2 packs per day); reports he can't seem to stop smoking and he would smoke in the shower or when sleeping if he could. He has tried Chantix and Wellbutrin and did not feel it did much He has been taking Remeron 15 mg for the last 10 days and notes a mild improvement in his depression. Still having sleep difficulties where he is sleeping during the day and unable to sleep at night  08/29/18 Patient reports fatigue, weakness and depressed mood for 3-4 months, gradually worsening for the last week or so.   "I've gotten to where I'm completely wore out. I feel weaker and weaker at an accelerated pace. Lately this past week I've been losing my mind. I'm panicked and frozen. I just don't feel like doing anything. I've started feeling so weak I've just given up"  Reports he has lost his endurance and has to take frequent breaks to walk a quarter of a mile. He used to be able to walk a quarter mile 2-3 times per day without taking a break. Denies arthralgia, myalgia, muscle weakness, gait disturbance, clumsiness or falls. Denies fever, unintended weight loss.  He endorses depressed mood and anhedonia.  Reports feeling tired all of the time. Endorses sleep disturbance where he is unable to sleep at night and sleeps during the day from 5/6am-2/3pm. Per sleep Medicine "recent sleep study did not show any significant obstructive sleep apnea, with the exception of snoring and supine-related sleep apnea   Admits to  suicidal thoughts. He is reluctant to talk about this because of past experience. He states the sheriff brings you to the hospital and no one does anything and its just a waste of his time and their time. He denies forming a plan, no guns at home, no recent self-harming behaviors.   Lives alone. Daughter nearby in [place] and they have a good relationship, but he feels that she dismisses most of his concerns.  Past Psychiatric History:  Reports he has been treated for anxiety and depression in the past. He has been hospitalized many years ago.  Previous suicide attempt: Patient has a history of suicidal attempt in 1995 when he overdosed on sleeping pills. Past trials of medication: Wellbutrin, Cymbalta, sertraline, Trazodone, Doxepin, Remeron, Hydroxyzine, Alprazolam Denies any alcohol use, reports he had a problem with alcohol 20-30 years ago Denies illicit drug use   Depression screen [facility] 2/9 09/23/2018 09/08/2018 08/29/2018 03/12/2018 02/17/2018  Decreased Interest _0 Down, Depressed, Hopeless _1 0 1  PHQ - 2 Score _2 Altered sleeping _3 Tired, decreased energy _4 Change in appetite  _0 - 3  Feeling bad or failure about yourself  _1 Trouble concentrating _2 Moving slowly or fidgety/restless 0 0 1 0 0  Suicidal thoughts _3 0 0  PHQ-9 Score _4 Difficult doing work/chores Very difficult - Extremely dIfficult Not difficult at all Somewhat difficult  Some recent data might be hidden    GAD 7 : Generalized Anxiety Score 09/23/2018 09/08/2018 08/29/2018 03/12/2018  Nervous, Anxious, on Edge _5 Control/stop worrying _6 Worry too much - different things _7 Trouble relaxing 1 0 3 1  Restless _8 Easily annoyed or irritable _9 0  Afraid - awful might happen _10 0  Total GAD 7 Score _11 Anxiety Difficulty Very difficult - Very difficult Somewhat difficult      Past Medical History:   Diagnosis Date  . Anxiety   . Arthritis   . Bipolar disorder ([place])   . CAD (coronary artery disease)    a. INF STEMI 07/04/10:  tx with thrombectomy + [facility] to [facility];  cath 07/04/10: dLM 10-20%, pLAD 40-50%, mLAD 20-30%, pRCA 30%, mRCA occluded and tx with PCI, EF 50% with inf HK. A Multilink  . COPD (chronic obstructive pulmonary disease) ([place])   . Depression   . Glucose intolerance (impaired glucose tolerance)    A1c 6.2 06/2010  . History of DVT (deep vein thrombosis)    traumatic, s/p coumadin tx.  Marland Kitchen HLD (hyperlipidemia)   . Hypertension    pt states is currently on no medications  . Myocardial infarction ([place])    around 2013  . Shortness of breath    walking distance or climbing stairs  . Tobacco abuse   . Urinary frequency   . Vitamin D deficiency 09/05/2018   Past Surgical History:  Procedure Laterality Date  . CARDIAC CATHETERIZATION    . COLONOSCOPY N/A 12/29/2015   Procedure: COLONOSCOPY;  Surgeon: Rogene Houston, MD;  [facility];  Service: Endoscopy;  Laterality: N/A;  12:55  . ELECTROCARDIOGRAM     Showed inferolateral ST-elevation and a code STEMI was activated. In the ER, he was treated with morphine, herarin, and 600 mg of Plavix. He was transferred emergently to [facility].   . INGUINAL HERNIA REPAIR Left 07/26/2014   Procedure: OPEN REPAIR OF RECURENT LEFT INGUINAL HERNIA REPAIR WITH MESH;  Surgeon: Greer Pickerel, MD;  Location: [facility] ORS;  Service: General;  Laterality: Left;  . INSERTION OF MESH Bilateral 10/30/2013   Procedure: INSERTION OF MESH;  Surgeon: Gayland Curry, MD;  Location: [facility];  Service: General;  Laterality: Bilateral;  . LAPAROSCOPIC INGUINAL HERNIA WITH UMBILICAL HERNIA Bilateral 10/30/2013   Procedure: laparoscopic repair left pantaloom hernia with mesh, laparoscopic right inguinal hernia with mesh, OPEN REPAIR OF UMBILICAL HERNIA ;  Surgeon: Gayland Curry, MD;  Location: [facility];  Service: General;  Laterality: Bilateral;   . LAPAROSCOPY N/A 07/26/2014   Procedure: LAPAROSCOPY DIAGNOSTIC;  Surgeon: Greer Pickerel, MD;  Location: [facility];  Service: General;  Laterality: N/A;  . POLYPECTOMY  12/29/2015   Procedure: POLYPECTOMY;  Surgeon: Rogene Houston, MD;  [facility];  Service: Endoscopy;;  ascending colon, descending colon  . stent placement    . TOTAL HIP ARTHROPLASTY Right 05/10/2015   Procedure:  RIGHT TOTAL HIP ARTHROPLASTY ANTERIOR APPROACH;  Surgeon: Paralee Cancel, MD;  Location: [facility];  Service: Orthopedics;  Laterality: Right;  Marland Kitchen VASECTOMY     Social History   Tobacco Use  . Smoking status: Current Every Day Smoker    Packs/day: 1.50    Years: 45.00    Pack years: 67.50    Types: Cigarettes  . Smokeless tobacco: Never Used  Substance Use Topics  . Alcohol use: No    Comment: past hx of ETOH use was in inpt facility per court order  - 20 years, 10-18-2015 per pt not anymore,per pt stopped about 15-20 yrs ago   family history includes Coronary artery disease in his father; Depression in his father and mother; Heart attack in his father.    Review of Systems  Constitutional: Positive for appetite change (skipping meals), chills and fatigue. Negative for fever.  Respiratory:       + COPD  Cardiovascular: Negative for chest pain and leg swelling.  Skin:       + diaphoresis  Neurological: Positive for weakness.  Psychiatric/Behavioral: Positive for decreased concentration, dysphoric mood, sleep disturbance and suicidal ideas. Negative for hallucinations and self-injury. The patient is nervous/anxious.   All other systems reviewed and are negative.    Medications: Current Outpatient Medications  Medication Sig Dispense Refill  . aspirin EC 81 MG tablet Take 1 tablet (81 mg total) by mouth daily. 90 tablet 3  . baclofen (LIORESAL) 10 MG tablet Take 1 tablet (10 mg total) by mouth 2 (two) times daily. 180 tablet 0  . blood glucose meter kit and supplies KIT Dispense based on patient  and insurance preference. Use to check BG once daily or every other other.  Dx: E11.9 1 each 0  . diclofenac (VOLTAREN) 75 MG EC tablet Take 1 tablet (75 mg total) by mouth 2 (two) times daily. 60 tablet 2  . fluticasone (FLONASE) 50 MCG/ACT nasal spray Place 2 sprays into both nostrils daily. 16 g 6  . gabapentin (NEURONTIN) 300 MG capsule Take 1 capsule (300 mg total) by mouth 3 (three) times daily. AS NEEDED FOR NERVE PAIN 180 capsule 3  . ipratropium-albuterol (DUONEB) 0.5-2.5 (3) MG/3ML SOLN Take 3 mLs by nebulization every 4 (four) hours as needed (wheeze, cough, short of breath. Max use 6 vials per day). 360 mL 99  . metFORMIN (GLUCOPHAGE-XR) 500 MG 24 hr tablet Take 2 tablets (1,000 mg total) by mouth daily with breakfast. Short term until mail order arrives 60 tablet 0  . metoprolol succinate (TOPROL-XL) 25 MG 24 hr tablet Take 0.5 tablets (12.5 mg total) by mouth daily. 90 tablet 0  . mirtazapine (REMERON) 30 MG tablet Take 0.5 tablets (15 mg total) by mouth at bedtime. 90 tablet 0  . Nebulizers (NEBULIZER COMPRESSOR) MISC Per insurance coverage and patient preference. Dx COPD 1 each 1  . Respiratory Therapy Supplies (NEBULIZER/TUBING/MOUTHPIECE) KIT Per insurance coverage and patient preference. Dx COPD 1 each 61  . sennosides-docusate sodium (SENOKOT-S) 8.6-50 MG tablet Take 1 tablet by mouth 3 (three) times daily as needed for constipation.     . sildenafil (VIAGRA) 100 MG tablet Take 0.5-1 tablets (50-100 mg total) by mouth daily as needed for erectile dysfunction. 10 tablet 11  . Tiotropium Bromide Monohydrate (SPIRIVA RESPIMAT) 2.5 MCG/ACT AERS Inhale 2 puffs into the lungs daily. 4 g 0  . albuterol (PROVENTIL HFA;VENTOLIN HFA) 108 (90 Base) MCG/ACT inhaler Inhale 2 puffs into the lungs every 4 (four) hours as needed  for wheezing or shortness of breath (AS NEEDED - RESCUE INHALER). (Patient not taking: Reported on 09/23/2018) 1 Inhaler 1  . atorvastatin (LIPITOR) 40 MG tablet Take 1  tablet (40 mg total) by mouth daily. 90 tablet 0  . ergocalciferol (VITAMIN D2) 1.25 MG (50000 UT) capsule Tale 1 capsule by mouth weekly for 4 weeks, then 1 capsule every 30 days for maintenance 6 capsule 1  . glipiZIDE (GLUCOTROL) 10 MG tablet Take 1 tablet (10 mg total) by mouth 2 (two) times daily before a meal. 180 tablet 0  . lisinopril (ZESTRIL) 5 MG tablet Take 1 tablet (5 mg total) by mouth daily. 90 tablet 0  . Suvorexant (BELSOMRA) 10 MG TABS Take 10 mg by mouth at bedtime. 30 tablet 1   No current facility-administered medications for this visit.    Allergies  Allergen Reactions  . Flomax [Tamsulosin Hcl] Other (See Comments)    This medication makes patient feel sick  . Penicillins Other (See Comments)    Reaction unknown occurred during childhood Has patient had a PCN reaction causing immediate rash, facial/tongue/throat swelling, SOB or lightheadedness with hypotension: unsure - childhood reaction Has patient had a PCN reaction causing severe rash involving mucus membranes or skin necrosis: unsure - childhood reaction Has patient had a PCN reaction that required hospitalization no Has patient had a PCN reaction occurring within the last 10 years: no If all of the above answers are "NO", then may p       Objective:  BP 119/80   Pulse 89   Temp 98.3 F (36.8 C) (Oral)   Wt 210 lb (95.3 kg)   SpO2 92%   BMI 31.01 kg/m  Gen: disheveled appearing, not ill-appearing, no acute distress, obese male HEENT: head normocephalic, atraumatic; conjunctiva and cornea clear, oropharynx clear, moist mucus membranes; neck supple, ROM intact Pulm: Normal work of breathing, normal phonation, clear to auscultation bilaterally CV: Normal rate, regular rhythm, s1 and s2 distinct, no murmurs, clicks or rubs Neuro: normal tone, no tremor MSK: strength 5/5 and symmetric in bilateral upper and lower extremities, normal gait and station Mental Status: alert and oriented x 3, speech  articulate, and thought processes clear and goal-directed Psych: cooperative, depressed mood, affect mood-congruent, speech is articulate, normal rate and volume; thought processes clear and goal-directed, normal judgment, good insight, not responding to internal stimuli, no SI/HI    Recent Results (from the past 2160 hour(s))  TSH     Status: None   Collection Time: 07/02/18 10:51 AM  Result Value Ref Range   TSH 1.680 0.450 - 4.500 uIU/mL  Comprehensive metabolic panel     Status: Abnormal   Collection Time: 07/02/18 10:51 AM  Result Value Ref Range   Glucose 124 (H) 65 - 99 mg/dL   BUN 19 8 - 27 mg/dL   Creatinine, Ser 1.14 0.76 - 1.27 mg/dL   GFR calc non Af Amer 65 >59 mL/min/1.73   GFR calc Af Amer 75 >59 mL/min/1.73   BUN/Creatinine Ratio 17 10 - 24   Sodium 141 134 - 144 mmol/L   Potassium 5.1 3.5 - 5.2 mmol/L   Chloride 102 96 - 106 mmol/L   CO2 20 20 - 29 mmol/L   Calcium 9.6 8.6 - 10.2 mg/dL   Total Protein 7.6 6.0 - 8.5 g/dL   Albumin 4.7 3.8 - 4.8 g/dL   Globulin, Total 2.9 1.5 - 4.5 g/dL   Albumin/Globulin Ratio 1.6 1.2 - 2.2   Bilirubin Total 0.2 0.0 -  1.2 mg/dL   Alkaline Phosphatase 74 39 - 117 IU/L   AST 24 0 - 40 IU/L   ALT 39 0 - 44 IU/L  POCT HgB A1C     Status: Abnormal   Collection Time: 07/14/18  2:03 PM  Result Value Ref Range   Hemoglobin A1C 7.9 (A) 4.0 - 5.6 %   HbA1c POC (<> result, manual entry)     HbA1c, POC (prediabetic range)     HbA1c, POC (controlled diabetic range)    C-reactive protein     Status: Abnormal   Collection Time: 08/29/18 11:02 AM  Result Value Ref Range   CRP 17.0 (H) <8.0 mg/L  Ferritin     Status: None   Collection Time: 08/29/18 11:02 AM  Result Value Ref Range   Ferritin 59 24 - 380 ng/mL  Sedimentation rate     Status: None   Collection Time: 08/29/18 11:02 AM  Result Value Ref Range   Sed Rate 19 0 - 20 mm/h  Vitamin B12     Status: Abnormal   Collection Time: 08/29/18 11:02 AM  Result Value Ref Range    Vitamin B-12 1,605 (H) 200 - 1,100 pg/mL  Vit D  25 hydroxy (rtn osteoporosis monitoring)     Status: Abnormal   Collection Time: 08/29/18 11:02 AM  Result Value Ref Range   Vit D, 25-Hydroxy 19 (L) 30 - 100 ng/mL    Comment: Vitamin D Status         25-OH Vitamin D: . Deficiency:                    <20 ng/mL Insufficiency:             20 - 29 ng/mL Optimal:                 > or = 30 ng/mL . For 25-OH Vitamin D testing on patients on  D2-supplementation and patients for whom quantitation  of D2 and D3 fractions is required, the QuestAssureD(TM) 25-OH VIT D, (D2,D3), LC/MS/MS is recommended: order  code [phone] (patients >51yr). See Note 1 . Note 1 . For additional information, please refer to  http://education.[facility].com/faq/FAQ199  (This link is being provided for informational/ educational purposes only.)   CBC with Differential/Platelet     Status: Abnormal   Collection Time: 08/29/18 11:02 AM  Result Value Ref Range   WBC 11.1 (H) 3.8 - 10.8 Thousand/uL   RBC 5.39 4.20 - 5.80 Million/uL   Hemoglobin 16.7 13.2 - 17.1 g/dL   HCT 48.6 38.5 - 50.0 %   MCV 90.2 80.0 - 100.0 fL   MCH 31.0 27.0 - 33.0 pg   MCHC 34.4 32.0 - 36.0 g/dL   RDW 12.4 11.0 - 15.0 %   Platelets 206 140 - 400 Thousand/uL   MPV 9.0 7.5 - 12.5 fL   Neutro Abs 8,025 (H) 1,500 - 7,800 cells/uL   Lymphs Abs 1,998 850 - 3,900 cells/uL   Absolute Monocytes 899 200 - 950 cells/uL   Eosinophils Absolute 111 15 - 500 cells/uL   Basophils Absolute 67 0 - 200 cells/uL   Neutrophils Relative % 72.3 %   Total Lymphocyte 18.0 %   Monocytes Relative 8.1 %   Eosinophils Relative 1.0 %   Basophils Relative 0.6 %  TSH + free T4     Status: None   Collection Time: 08/29/18 11:02 AM  Result Value Ref Range   TSH W/REFLEX TO FT4 1.38 0.40 -  4.50 mIU/L  COMPLETE METABOLIC PANEL WITH GFR     Status: Abnormal   Collection Time: 08/29/18 11:02 AM  Result Value Ref Range   Glucose, Bld 245 (H) 65 - 99 mg/dL     Comment: .            Fasting reference interval . For someone without known diabetes, a glucose value >125 mg/dL indicates that they may have diabetes and this should be confirmed with a follow-up test. .    BUN 18 7 - 25 mg/dL   Creat 1.13 0.70 - 1.25 mg/dL    Comment: For patients >76 years of age, the reference limit for Creatinine is approximately 13% higher for people identified as African-American. .    GFR, Est Non African American 66 > OR = 60 mL/min/1.77m   GFR, Est African American 76 > OR = 60 mL/min/1.771m  BUN/Creatinine Ratio NOT APPLICABLE 6 - 22 (calc)   Sodium 134 (L) 135 - 146 mmol/L   Potassium 4.5 3.5 - 5.3 mmol/L   Chloride 98 98 - 110 mmol/L   CO2 26 20 - 32 mmol/L   Calcium 9.5 8.6 - 10.3 mg/dL   Total Protein 7.1 6.1 - 8.1 g/dL   Albumin 4.2 3.6 - 5.1 g/dL   Globulin 2.9 1.9 - 3.7 g/dL (calc)   AG Ratio 1.4 1.0 - 2.5 (calc)   Total Bilirubin 0.5 0.2 - 1.2 mg/dL   Alkaline phosphatase (APISO) 94 35 - 144 U/L   AST 22 10 - 35 U/L   ALT 29 9 - 46 U/L  PSA, Total with Reflex to PSA, Free     Status: None   Collection Time: 08/29/18 11:02 AM  Result Value Ref Range   PSA, Total 0.3 < OR = 4.0 ng/mL    Comment: The Total PSA value from this assay system is  standardized against the equimolar PSA standard.  The test result will be approximately 20% higher  when compared to the [facility] PSA  (Siemens assay). Comparison of serial PSA results  should be interpreted with this fact in mind. . Marland KitchenSA was performed using the Beckman Coulter  Immunoassay method. Values obtained from different  assay methods cannot be used interchangeably. PSA  levels, regardless of value, should not be  interpreted as absolute evidence of the presence or  absence of disease.   CBC (INCLUDES DIFF/PLT) WITH PATHOLOGIST REVIEW     Status: None   Collection Time: 09/08/18  4:58 PM  Result Value Ref Range   WBC 8.7 3.8 - 10.8 Thousand/uL   RBC 5.51 4.20 - 5.80  Million/uL   Hemoglobin 17.1 13.2 - 17.1 g/dL   HCT 49.9 38.5 - 50.0 %   MCV 90.6 80.0 - 100.0 fL   MCH 31.0 27.0 - 33.0 pg   MCHC 34.3 32.0 - 36.0 g/dL   RDW 12.5 11.0 - 15.0 %   Platelets 255 140 - 400 Thousand/uL   MPV 10.3 7.5 - 12.5 fL   Neutro Abs 6,168 1,500 - 7,800 cells/uL   Lymphs Abs 1,723 850 - 3,900 cells/uL   Absolute Monocytes 661 200 - 950 cells/uL   Eosinophils Absolute 70 15 - 500 cells/uL   Basophils Absolute 78 0 - 200 cells/uL   Neutrophils Relative % 70.9 %   Total Lymphocyte 19.8 %   Monocytes Relative 7.6 %   Eosinophils Relative 0.8 %   Basophils Relative 0.9 %   Comment      Comment: Myeloid  population consists predominantly of mature segmented neutrophils with mild left shift and reactive changes. No immature cells are identified. Platelet clumps noted on smear-count appears adequate. RBCs demonstrate slight polychromasia. Reviewed by Francis Gaines Mammarappallil, MD  (Electronic Signature on File)     09/09/2018   CBC     Status: Abnormal   Collection Time: 09/23/18  2:29 PM  Result Value Ref Range   WBC 8.7 3.8 - 10.8 Thousand/uL   RBC 5.61 4.20 - 5.80 Million/uL   Hemoglobin 17.4 (H) 13.2 - 17.1 g/dL   HCT 50.2 (H) 38.5 - 50.0 %   MCV 89.5 80.0 - 100.0 fL   MCH 31.0 27.0 - 33.0 pg   MCHC 34.7 32.0 - 36.0 g/dL   RDW 12.3 11.0 - 15.0 %   Platelets 225 140 - 400 Thousand/uL   MPV 9.3 7.5 - 12.5 fL  COMPLETE METABOLIC PANEL WITH GFR     Status: Abnormal   Collection Time: 09/23/18  2:29 PM  Result Value Ref Range   Glucose, Bld 222 (H) 65 - 99 mg/dL    Comment: .            Fasting reference interval . For someone without known diabetes, a glucose value >125 mg/dL indicates that they may have diabetes and this should be confirmed with a follow-up test. .    BUN 13 7 - 25 mg/dL   Creat 1.17 0.70 - 1.25 mg/dL    Comment: For patients >34 years of age, the reference limit for Creatinine is approximately 13% higher for people identified as  African-American. .    GFR, Est Non African American 63 > OR = 60 mL/min/1.27m   GFR, Est African American 73 > OR = 60 mL/min/1.774m  BUN/Creatinine Ratio NOT APPLICABLE 6 - 22 (calc)   Sodium 135 135 - 146 mmol/L   Potassium 4.8 3.5 - 5.3 mmol/L   Chloride 98 98 - 110 mmol/L   CO2 26 20 - 32 mmol/L   Calcium 9.5 8.6 - 10.3 mg/dL   Total Protein 7.6 6.1 - 8.1 g/dL   Albumin 4.3 3.6 - 5.1 g/dL   Globulin 3.3 1.9 - 3.7 g/dL (calc)   AG Ratio 1.3 1.0 - 2.5 (calc)   Total Bilirubin 0.6 0.2 - 1.2 mg/dL   Alkaline phosphatase (APISO) 93 35 - 144 U/L   AST 30 10 - 35 U/L   ALT 36 9 - 46 U/L  Lipid panel     Status: Abnormal   Collection Time: 09/23/18  2:29 PM  Result Value Ref Range   Cholesterol 147 <200 mg/dL   HDL 35 (L) > OR = 40 mg/dL   Triglycerides 219 (H) <150 mg/dL    Comment: . If a non-fasting specimen was collected, consider repeat triglyceride testing on a fasting specimen if clinically indicated.  JaYates Decampt al. J. of Clin. Lipidol. 200600;4:599-774. Marland Kitchen  LDL Cholesterol (Calc) 80 mg/dL (calc)    Comment: Reference range: <100 . Desirable range <100 mg/dL for primary prevention;   <70 mg/dL for patients with CHD or diabetic patients  with > or = 2 CHD risk factors. . Marland KitchenDL-C is now calculated using the Martin-Hopkins  calculation, which is a validated novel method providing  better accuracy than the Friedewald equation in the  estimation of LDL-C.  MaCresenciano Genret al. JAAnnamaria Helling201423;[id number]  (http://education.[facility].com/faq/FAQ164)    Total CHOL/HDL Ratio 4.2 <5.0 (calc)   Non-HDL Cholesterol (Calc) 112 <130 mg/dL (calc)  Comment: For patients with diabetes plus 1 major ASCVD risk  factor, treating to a non-HDL-C goal of <100 mg/dL  (LDL-C of <70 mg/dL) is considered a therapeutic  option.   Hemoglobin A1c     Status: Abnormal   Collection Time: 09/23/18  2:29 PM  Result Value Ref Range   Hgb A1c MFr Bld 8.6 (H) <5.7 % of total Hgb     Comment: For someone without known diabetes, a hemoglobin A1c value of 6.5% or greater indicates that they may have  diabetes and this should be confirmed with a follow-up  test. . For someone with known diabetes, a value <7% indicates  that their diabetes is well controlled and a value  greater than or equal to 7% indicates suboptimal  control. A1c targets should be individualized based on  duration of diabetes, age, comorbid conditions, and  other considerations. . Currently, no consensus exists regarding use of hemoglobin A1c for diagnosis of diabetes for children. .    Mean Plasma Glucose 200 (calc)   eAG (mmol/L) 11.1 (calc)  VITAMIN D 25 Hydroxy (Vit-D Deficiency, Fractures)     Status: None   Collection Time: 09/23/18  2:29 PM  Result Value Ref Range   Vit D, 25-Hydroxy 35 30 - 100 ng/mL    Comment: Vitamin D Status         25-OH Vitamin D: . Deficiency:                    <20 ng/mL Insufficiency:             20 - 29 ng/mL Optimal:                 > or = 30 ng/mL . For 25-OH Vitamin D testing on patients on  D2-supplementation and patients for whom quantitation  of D2 and D3 fractions is required, the QuestAssureD(TM) 25-OH VIT D, (D2,D3), LC/MS/MS is recommended: order  code [phone] (patients >78yr). See Note 1 . Note 1 . For additional information, please refer to  http://education.[facility].com/faq/FAQ199  (This link is being provided for informational/ educational purposes only.)   Urinalysis, Routine w reflex microscopic     Status: Abnormal   Collection Time: 09/23/18  2:29 PM  Result Value Ref Range   Color, Urine YELLOW YELLOW   APPearance CLEAR CLEAR   Specific Gravity, Urine 1.011 1.001 - 1.03   pH 5.5 5.0 - 8.0   Glucose, UA 2+ (A) NEGATIVE   Bilirubin Urine NEGATIVE NEGATIVE   Ketones, ur NEGATIVE NEGATIVE   Hgb urine dipstick NEGATIVE NEGATIVE   Protein, ur NEGATIVE NEGATIVE   Nitrite NEGATIVE NEGATIVE   Leukocytes,Ua NEGATIVE NEGATIVE   TSH     Status: None   Collection Time: 09/23/18  2:29 PM  Result Value Ref Range   TSH 1.21 0.40 - 4.50 mIU/L    No results found for this or any previous visit (from the past 72 hour(s)). No results found.    Assessment and Plan: 68y.o. male with   .DLeviiwas seen today for follow-up.  Diagnoses and all orders for this visit:  Mediastinal adenopathy -     DG Chest 2 View -     CBC (INCLUDES DIFF/PLT) WITH PATHOLOGIST REVIEW  Chronic night sweats -     DG Chest 2 View  Mood disorder (HCC) -     mirtazapine (REMERON) 30 MG tablet; Take 0.5 tablets (15 mg total) by mouth at bedtime.  Leukocytosis, unspecified type -  CBC (INCLUDES DIFF/PLT) WITH PATHOLOGIST REVIEW  Vitamin D deficiency -     ergocalciferol (VITAMIN D2) 1.25 MG (50000 UT) capsule; Tale 1 capsule by mouth weekly for 4 weeks, then 1 capsule every 30 days for maintenance    Mood disorder   Office Visit from 09/08/2018 in [place]  PHQ-9 Total Score  17    PHQ9=17, improved from 22, no acute safety issues Patient endorses passive SI, he was verbally contracted for safety Safety plan reviewed with patient and he has crisis phone numbers from his last visit Increase Remeron to 22.5 mg at bedtime for the next week or until new Rx arrives from [place], then increase to 30 mg Follow-up with PCP in 2 weeks  Generalized weakness, Fatigue, Mediastinal adenopathy on CT scan Likely multifactorial due to uncontrolled mood disorder, sleep disorder, uncontrolled diabetes and COPD/nocturnal hypoxemia Lab work-up significant for mild leukocytosis and vit d deficiency Start ergocalciferol 50,000U weekly for 4 weeks, then monthly Repeat CXR and CBC w/diff/smear today   Patient education and anticipatory guidance given Patient agrees with treatment plan Follow-up in 2 weeks with PCP or sooner as needed if symptoms worsen or fail to improve      Darlyne Russian  PA-C

## 2018-09-08 NOTE — Patient Outreach (Signed)
[place] [facility]) Care Management  09/08/2018  Anthony [name]. 1950/08/10 [id number]   Medication Adherence call to Anthony [name] patient did not answer patient is past due on Lisinopril 5 mg under [place].  [place] Management Direct Dial [phone]  Fax [phone] [email or web address]

## 2018-09-09 LAB — CBC (INCLUDES DIFF/PLT) WITH PATHOLOGIST REVIEW
Absolute Monocytes: 661 cells/uL (ref 200–950)
Basophils Absolute: 78 cells/uL (ref 0–200)
Basophils Relative: 0.9 %
Eosinophils Absolute: 70 cells/uL (ref 15–500)
Eosinophils Relative: 0.8 %
HCT: 49.9 % (ref 38.5–50.0)
Hemoglobin: 17.1 g/dL (ref 13.2–17.1)
Lymphs Abs: 1723 cells/uL (ref 850–3900)
MCH: 31 pg (ref 27.0–33.0)
MCHC: 34.3 g/dL (ref 32.0–36.0)
MCV: 90.6 fL (ref 80.0–100.0)
MPV: 10.3 fL (ref 7.5–12.5)
Monocytes Relative: 7.6 %
Neutro Abs: 6168 cells/uL (ref 1500–7800)
Neutrophils Relative %: 70.9 %
Platelets: 255 10*3/uL (ref 140–400)
RBC: 5.51 10*6/uL (ref 4.20–5.80)
RDW: 12.5 % (ref 11.0–15.0)
Total Lymphocyte: 19.8 %
WBC: 8.7 10*3/uL (ref 3.8–10.8)

## 2018-09-10 ENCOUNTER — Other Ambulatory Visit: Payer: Self-pay

## 2018-09-10 MED ORDER — ATORVASTATIN CALCIUM 40 MG PO TABS: 40.0000 mg | ORAL_TABLET | Freq: Every day | ORAL | 0 refills | Status: DC

## 2018-09-10 MED ORDER — GLIPIZIDE 10 MG PO TABS: 10.0000 mg | ORAL_TABLET | Freq: Two times a day (BID) | ORAL | 0 refills | Status: DC

## 2018-09-10 MED ORDER — LISINOPRIL 5 MG PO TABS: 5.0000 mg | ORAL_TABLET | Freq: Every day | ORAL | 0 refills | Status: DC

## 2018-09-12 NOTE — Telephone Encounter (Signed)
Attempted to contact patient. No answer, left voicemail.  Pharmacy team has made multiple attempts to contact patient to follow up on income documents for patient assistance application without success. Closing encounter.   Please follow up with patient at his next office visit on 11/03/18.  10:27 AM Beatriz Chancellor, CPhT

## 2018-09-23 ENCOUNTER — Other Ambulatory Visit: Payer: Self-pay

## 2018-09-23 ENCOUNTER — Encounter: Payer: Self-pay | Admitting: Osteopathic Medicine

## 2018-09-23 ENCOUNTER — Ambulatory Visit (INDEPENDENT_AMBULATORY_CARE_PROVIDER_SITE_OTHER): Payer: Medicare Other | Admitting: Osteopathic Medicine

## 2018-09-23 VITALS — BP 110/66 | HR 70 | Temp 98.0°F | Wt 209.5 lb

## 2018-09-23 DIAGNOSIS — E1165 Type 2 diabetes mellitus with hyperglycemia: Secondary | ICD-10-CM | POA: Diagnosis not present

## 2018-09-23 DIAGNOSIS — E559 Vitamin D deficiency, unspecified: Secondary | ICD-10-CM | POA: Diagnosis not present

## 2018-09-23 DIAGNOSIS — R5382 Chronic fatigue, unspecified: Secondary | ICD-10-CM

## 2018-09-23 DIAGNOSIS — G4700 Insomnia, unspecified: Secondary | ICD-10-CM | POA: Diagnosis not present

## 2018-09-23 DIAGNOSIS — R531 Weakness: Secondary | ICD-10-CM | POA: Diagnosis not present

## 2018-09-23 DIAGNOSIS — M7918 Myalgia, other site: Secondary | ICD-10-CM | POA: Diagnosis not present

## 2018-09-23 MED ORDER — BELSOMRA 10 MG PO TABS: 10.0000 mg | ORAL_TABLET | Freq: Every day | ORAL | 1 refills | Status: DC

## 2018-09-23 NOTE — Progress Notes (Signed)
6 

## 2018-09-23 NOTE — Progress Notes (Signed)
HPI: Anthony [name]. is a 68 y.o. male who  has a past medical history of Anxiety, Arthritis, Bipolar disorder ([place]), CAD (coronary artery disease), COPD (chronic obstructive pulmonary disease) ([place]), Depression, Glucose intolerance (impaired glucose tolerance), History of DVT (deep vein thrombosis), HLD (hyperlipidemia), Hypertension, Myocardial infarction [facility]), Shortness of breath, Tobacco abuse, Urinary frequency, and Vitamin D deficiency (09/05/2018).  he presents to [facility] today, 09/23/18,  for chief complaint of:  Insomnia   Waking up frequently. "Switched my [sleep] schedule" and feels like ha can't function. Reports 3-4 days of constipation. Activity level is essentially nil. Motivation is essentially nil. He would like to get back to doing things that bring him enjoyment. He feels "I'm just taking up space."   Depression screen [facility] 2/9 09/23/2018 09/08/2018 08/29/2018  Decreased Interest _0 Down, Depressed, Hopeless _1 PHQ - 2 Score _2 Altered sleeping _3 Tired, decreased energy _4 Change in appetite _5 Feeling bad or failure about yourself  _6 Trouble concentrating _7 Moving slowly or fidgety/restless 0 0 1  Suicidal thoughts _8 PHQ-9 Score _9 Difficult doing work/chores Very difficult - Extremely dIfficult  Some recent data might be hidden         At today's visit 09/23/18 ... PMH, PSH, FH reviewed and updated as needed.  Current medication list and allergy/intolerance hx reviewed and updated as needed. (See remainder of HPI, ROS, Phys Exam below)   No results found.  No results found for this or any previous visit (from the past 72 hour(s)).        ASSESSMENT/PLAN: The primary encounter diagnosis was Insomnia, unspecified type. Diagnoses of Generalized weakness, Chronic fatigue, and Type 2 diabetes mellitus with hyperglycemia, without long-term current use of insulin (HCC)  were also pertinent to this visit.   Orders Placed This Encounter  Procedures  . CBC  . COMPLETE METABOLIC PANEL WITH GFR  . Lipid panel  . Hemoglobin A1c  . VITAMIN D 25 Hydroxy (Vit-D Deficiency, Fractures)  . Urinalysis, Routine w reflex microscopic     Meds ordered this encounter  Medications  . Suvorexant (BELSOMRA) 10 MG TABS    Sig: Take 10 mg by mouth at bedtime.    Dispense:  30 tablet    Refill:  1   Pt feels if sleep were improved, mood and energy would be better. Let's try! D/C temazepam, pt reports taking this in "2-3 doses" and it wasn't helpful... Low threshold for psych referral but pt would like to hold off on this for now.       Follow-up plan: Return in about 4 weeks (around 10/21/2018) for recheck sleep, sooner if needed / depending on labs .                                                 ################################################# ################################################# ################################################# #################################################    Current Meds  Medication Sig  . aspirin EC 81 MG tablet Take 1 tablet (81 mg total) by mouth daily.  Marland Kitchen atorvastatin (LIPITOR) 40 MG tablet Take 1 tablet (40 mg total) by mouth daily.  . baclofen (LIORESAL) 10 MG tablet Take 1 tablet (10 mg total) by mouth 2 (  two) times daily.  . blood glucose meter kit and supplies KIT Dispense based on patient and insurance preference. Use to check BG once daily or every other other.  Dx: E11.9  . diclofenac (VOLTAREN) 75 MG EC tablet Take 1 tablet (75 mg total) by mouth 2 (two) times daily.  . ergocalciferol (VITAMIN D2) 1.25 MG (50000 UT) capsule Tale 1 capsule by mouth weekly for 4 weeks, then 1 capsule every 30 days for maintenance  . fluticasone (FLONASE) 50 MCG/ACT nasal spray Place 2 sprays into both nostrils daily.  Marland Kitchen gabapentin (NEURONTIN) 300 MG capsule Take 1 capsule (300 mg  total) by mouth 3 (three) times daily. AS NEEDED FOR NERVE PAIN  . glipiZIDE (GLUCOTROL) 10 MG tablet Take 1 tablet (10 mg total) by mouth 2 (two) times daily before a meal.  . ipratropium-albuterol (DUONEB) 0.5-2.5 (3) MG/3ML SOLN Take 3 mLs by nebulization every 4 (four) hours as needed (wheeze, cough, short of breath. Max use 6 vials per day).  Marland Kitchen lisinopril (ZESTRIL) 5 MG tablet Take 1 tablet (5 mg total) by mouth daily.  . metFORMIN (GLUCOPHAGE-XR) 500 MG 24 hr tablet Take 2 tablets (1,000 mg total) by mouth daily with breakfast. Short term until mail order arrives  . metoprolol succinate (TOPROL-XL) 25 MG 24 hr tablet Take 0.5 tablets (12.5 mg total) by mouth daily.  . mirtazapine (REMERON) 30 MG tablet Take 0.5 tablets (15 mg total) by mouth at bedtime.  . Nebulizers (NEBULIZER COMPRESSOR) MISC Per insurance coverage and patient preference. Dx COPD  . Respiratory Therapy Supplies (NEBULIZER/TUBING/MOUTHPIECE) KIT Per insurance coverage and patient preference. Dx COPD  . sennosides-docusate sodium (SENOKOT-S) 8.6-50 MG tablet Take 1 tablet by mouth 3 (three) times daily as needed for constipation.   . sildenafil (VIAGRA) 100 MG tablet Take 0.5-1 tablets (50-100 mg total) by mouth daily as needed for erectile dysfunction.  . Tiotropium Bromide Monohydrate (SPIRIVA RESPIMAT) 2.5 MCG/ACT AERS Inhale 2 puffs into the lungs daily.  . [DISCONTINUED] temazepam (RESTORIL) 15 MG capsule Take 1 capsule (15 mg total) by mouth at bedtime as needed for sleep.    Allergies  Allergen Reactions  . Flomax [Tamsulosin Hcl] Other (See Comments)    This medication makes patient feel sick  . Penicillins Other (See Comments)    Reaction unknown occurred during childhood Has patient had a PCN reaction causing immediate rash, facial/tongue/throat swelling, SOB or lightheadedness with hypotension: unsure - childhood reaction Has patient had a PCN reaction causing severe rash involving mucus membranes or skin  necrosis: unsure - childhood reaction Has patient had a PCN reaction that required hospitalization no Has patient had a PCN reaction occurring within the last 10 years: no If all of the above answers are "NO", then may p       Review of Systems:  Constitutional: No recent illness, +fatigue   HEENT: No  headache, no vision change  Cardiac: No  chest pain, No  pressure, No palpitations  Respiratory:  No  shortness of breath. No  Cough  Gastrointestinal: No  abdominal pain  Musculoskeletal: No new myalgia/arthralgia  Skin: No  Rash  Hem/Onc: No  easy bruising/bleeding, No  abnormal lumps/bumps  Neurologic: No  weakness, No  Dizziness  Psychiatric: +concerns with depression, No  concerns with anxiety  Exam:  BP 110/66 (BP Location: Left Arm, Patient Position: Sitting, Cuff Size: Normal)   Pulse 70   Temp 98 F (36.7 C) (Oral)   Wt 209 lb 8 oz (95 kg)  BMI 30.94 kg/m   Constitutional: VS see above. General Appearance: alert, well-developed, well-nourished, NAD  Eyes: Normal lids and conjunctive, non-icteric sclera  Ears, Nose, Mouth, Throat: MMM, Normal external inspection ears/nares/mouth/lips/gums.  Neck: No masses, trachea midline.   Respiratory: Normal respiratory effort. no wheeze, no rhonchi, no rales  Cardiovascular: S1/S2 normal, no murmur, no rub/gallop auscultated. RRR.   Musculoskeletal: Gait normal. Symmetric and independent movement of all extremities  Neurological: Normal balance/coordination. No tremor.  Skin: warm, dry, intact.   Psychiatric: Normal judgment/insight. Normal mood and affect. Oriented x3.       Visit summary with medication list and pertinent instructions was printed for patient to review, patient was advised to alert [place] if any updates are needed. All questions at time of visit were answered - patient instructed to contact office with any additional concerns. ER/RTC precautions were reviewed with the patient and understanding  verbalized.   Note: Total time spent 25 minutes, greater than 50% of the visit was spent face-to-face counseling and coordinating care for the following: The primary encounter diagnosis was Insomnia, unspecified type. Diagnoses of Generalized weakness, Chronic fatigue, and Type 2 diabetes mellitus with hyperglycemia, without long-term current use of insulin (HCC) were also pertinent to this visit.Marland Kitchen  Please note: voice recognition software was used to produce this document, and typos may escape review. Please contact Dr. Sheppard Coil for any needed clarifications.    Follow up plan: Return in about 4 weeks (around 10/21/2018) for recheck sleep, sooner if needed / depending on labs .

## 2018-09-24 LAB — CBC
HCT: 50.2 % — ABNORMAL HIGH (ref 38.5–50.0)
Hemoglobin: 17.4 g/dL — ABNORMAL HIGH (ref 13.2–17.1)
MCH: 31 pg (ref 27.0–33.0)
MCHC: 34.7 g/dL (ref 32.0–36.0)
MCV: 89.5 fL (ref 80.0–100.0)
MPV: 9.3 fL (ref 7.5–12.5)
Platelets: 225 10*3/uL (ref 140–400)
RBC: 5.61 10*6/uL (ref 4.20–5.80)
RDW: 12.3 % (ref 11.0–15.0)
WBC: 8.7 10*3/uL (ref 3.8–10.8)

## 2018-09-24 LAB — COMPLETE METABOLIC PANEL WITH GFR
AG Ratio: 1.3 (calc) (ref 1.0–2.5)
ALT: 36 U/L (ref 9–46)
AST: 30 U/L (ref 10–35)
Albumin: 4.3 g/dL (ref 3.6–5.1)
Alkaline phosphatase (APISO): 93 U/L (ref 35–144)
BUN: 13 mg/dL (ref 7–25)
CO2: 26 mmol/L (ref 20–32)
Calcium: 9.5 mg/dL (ref 8.6–10.3)
Chloride: 98 mmol/L (ref 98–110)
Creat: 1.17 mg/dL (ref 0.70–1.25)
GFR, Est African American: 73 mL/min/{1.73_m2} (ref >=60)
GFR, Est Non African American: 63 mL/min/{1.73_m2} (ref >=60)
Globulin: 3.3 g/dL (calc) (ref 1.9–3.7)
Glucose, Bld: 222 mg/dL — ABNORMAL HIGH (ref 65–99)
Potassium: 4.8 mmol/L (ref 3.5–5.3)
Sodium: 135 mmol/L (ref 135–146)
Total Bilirubin: 0.6 mg/dL (ref 0.2–1.2)
Total Protein: 7.6 g/dL (ref 6.1–8.1)

## 2018-09-24 LAB — URINALYSIS, ROUTINE W REFLEX MICROSCOPIC
Bilirubin Urine: NEGATIVE
Hgb urine dipstick: NEGATIVE
Ketones, ur: NEGATIVE
Leukocytes,Ua: NEGATIVE
Nitrite: NEGATIVE
Protein, ur: NEGATIVE
Specific Gravity, Urine: 1.011 (ref 1.001–1.03)
pH: 5.5 (ref 5.0–8.0)

## 2018-09-24 LAB — VITAMIN D 25 HYDROXY (VIT D DEFICIENCY, FRACTURES): Vit D, 25-Hydroxy: 35 ng/mL (ref 30–100)

## 2018-09-24 LAB — LIPID PANEL
Cholesterol: 147 mg/dL (ref <200)
HDL: 35 mg/dL — ABNORMAL LOW (ref >=40)
LDL Cholesterol (Calc): 80 mg/dL (calc)
Non-HDL Cholesterol (Calc): 112 mg/dL (calc) (ref <130)
Total CHOL/HDL Ratio: 4.2 (calc) (ref <5.0)
Triglycerides: 219 mg/dL — ABNORMAL HIGH (ref <150)

## 2018-09-24 LAB — HEMOGLOBIN A1C
Hgb A1c MFr Bld: 8.6 % of total Hgb — ABNORMAL HIGH (ref <5.7)
Mean Plasma Glucose: 200 (calc)
eAG (mmol/L): 11.1 (calc)

## 2018-09-24 LAB — TSH: TSH: 1.21 mIU/L (ref 0.40–4.50)

## 2018-09-28 ENCOUNTER — Encounter: Payer: Self-pay | Admitting: Physician Assistant

## 2018-10-14 ENCOUNTER — Ambulatory Visit: Payer: Medicare Other | Admitting: Osteopathic Medicine

## 2018-10-21 ENCOUNTER — Encounter: Payer: Self-pay | Admitting: Physician Assistant

## 2018-10-21 ENCOUNTER — Other Ambulatory Visit: Payer: Self-pay

## 2018-10-21 ENCOUNTER — Ambulatory Visit (INDEPENDENT_AMBULATORY_CARE_PROVIDER_SITE_OTHER): Payer: Medicare Other | Admitting: Physician Assistant

## 2018-10-21 VITALS — BP 124/81 | HR 74 | Ht 69.5 in | Wt 211.0 lb

## 2018-10-21 DIAGNOSIS — I2510 Atherosclerotic heart disease of native coronary artery without angina pectoris: Secondary | ICD-10-CM

## 2018-10-21 DIAGNOSIS — E1165 Type 2 diabetes mellitus with hyperglycemia: Secondary | ICD-10-CM | POA: Diagnosis not present

## 2018-10-21 DIAGNOSIS — E785 Hyperlipidemia, unspecified: Secondary | ICD-10-CM

## 2018-10-21 DIAGNOSIS — E1169 Type 2 diabetes mellitus with other specified complication: Secondary | ICD-10-CM

## 2018-10-21 DIAGNOSIS — F5101 Primary insomnia: Secondary | ICD-10-CM | POA: Diagnosis not present

## 2018-10-21 DIAGNOSIS — F331 Major depressive disorder, recurrent, moderate: Secondary | ICD-10-CM

## 2018-10-21 DIAGNOSIS — F17200 Nicotine dependence, unspecified, uncomplicated: Secondary | ICD-10-CM

## 2018-10-21 LAB — GLUCOSE, POCT (MANUAL RESULT ENTRY): POC Glucose: 273 mg/dl — AB (ref 70–99)

## 2018-10-21 MED ORDER — RAMELTEON 8 MG PO TABS: 8.0000 mg | ORAL_TABLET | Freq: Every day | ORAL | 2 refills | Status: DC

## 2018-10-21 MED ORDER — METFORMIN HCL ER 500 MG PO TB24: 1000.0000 mg | ORAL_TABLET | Freq: Every day | ORAL | 0 refills | Status: DC

## 2018-10-21 MED ORDER — GLIPIZIDE 10 MG PO TABS: 10.0000 mg | ORAL_TABLET | Freq: Two times a day (BID) | ORAL | 0 refills | Status: DC

## 2018-10-21 NOTE — Progress Notes (Signed)
Subjective:    Patient ID: Anthony Form., male    DOB: Sep 12, 1950, 68 y.o.   MRN: [id number]  HPI Pt is a 67 yo male with significant past medical history of   .Marland Kitchen Active Ambulatory Problems    Diagnosis Date Noted  . CAD (coronary artery disease)   . Hyperlipidemia associated with type 2 diabetes mellitus ([place])   . COPD (chronic obstructive pulmonary disease) ([place])   . Tobacco abuse   . Inadequate anticoagulation 04/29/2012  . Mood disorder ([place])   . Major psychotic depression, recurrent ([place]) 12/16/2012  . Bilateral inguinal hernia 05/28/2013  . Umbilical hernia [id number]  . S/P bilateral inguinal hernia repair 10/30/2013  . Chronic right hip pain 06/23/2014  . Recurrent left inguinal hernia 07/26/2014  . S/P right THA, AA 05/10/2015  . Moderate episode of recurrent major depressive disorder ([place]) 10/18/2015  . Rectal bleeding 11/23/2015  . Obesity (BMI 30-39.9) 04/13/2016  . Diabetes mellitus without complication ([place]) [id number]  . Pain medication agreement signed 06/05/2016  . Opioid dependence ([place]) 06/05/2016  . Aortic atherosclerosis ([place]) 10/19/2016  . Insomnia 03/26/2017  . Multiple lung nodules 08/03/2018  . Chronic fatigue 08/29/2018  . Generalized weakness 08/29/2018  . Vitamin D deficiency 09/05/2018  . Elevated vitamin B12 level 09/05/2018  . Acute neutrophilia 09/05/2018  . Tobacco dependence 10/21/2018   Resolved Ambulatory Problems    Diagnosis Date Noted  . Depression   . Hypocalcemia 07/28/2010  . DVT (deep venous thrombosis) ([place]) 04/29/2012  . Right leg DVT ([place]) 04/29/2012  . Suicidal ideation 12/17/2012  . Nodule of left lung 05/29/2013  . Overweight (BMI 25.0-29.9) 05/11/2015   Past Medical History:  Diagnosis Date  . Anxiety   . Arthritis   . Bipolar disorder ([place])   . Glucose intolerance (impaired glucose tolerance)   . History of DVT (deep vein thrombosis)   . HLD (hyperlipidemia)   . Hypertension   . Myocardial infarction  ([place])   . Shortness of breath   . Urinary frequency    He presents to the clinic to follow up on T2DM and sleep.   He missed appt with PcP and because it was dealing with his sugars they placed him on my schedule.   He was seen 4 weeks ago by PcP and a1c was 8.6. he is not checking his sugars and admits to not taking his medication. He continues to smoke every day. He does not watch his diet or exercise.   His sleep has not changed at all. He was started on belsomra but not helped. He has tried temazepam in the past and "helps the best". PCP took off and start belsomnra at last visit. He has problems going and staying asleep.    Review of Systems See HPI.    Objective:   Physical Exam Vitals signs reviewed.  HENT:     Head: Normocephalic.  Cardiovascular:     Rate and Rhythm: Normal rate and regular rhythm.     Pulses: Normal pulses.  Pulmonary:     Effort: Pulmonary effort is normal.     Comments: Coarse breath sounds.  Neurological:     General: No focal deficit present.     Mental Status: He is alert and oriented to person, place, and time.  Psychiatric:     Comments: Flat affect.            Assessment & Plan:   Marland KitchenMarland KitchenJomo was seen today for diabetes.  Diagnoses and all orders  for this visit:  Primary insomnia -     ramelteon (ROZEREM) 8 MG tablet; Take 1 tablet (8 mg total) by mouth at bedtime.  Hyperlipidemia associated with type 2 diabetes mellitus ([place])  Coronary artery disease involving native coronary artery of native heart without angina pectoris  Type 2 diabetes mellitus with hyperglycemia, without long-term current use of insulin (HCC) -     metFORMIN (GLUCOPHAGE-XR) 500 MG 24 hr tablet; Take 2 tablets (1,000 mg total) by mouth daily with breakfast. Short term until mail order arrives -     POCT Glucose (CBG)  Tobacco dependence  Moderate episode of recurrent major depressive disorder ([place])  Other orders -     Discontinue: glipiZIDE (GLUCOTROL)  10 MG tablet; Take 1 tablet (10 mg total) by mouth 2 (two) times daily before a meal.   Too soon for A1C.  .. Results for orders placed or performed in visit on 10/21/18  POCT Glucose (CBG)  Result Value Ref Range   POC Glucose 273 (A) 70 - 99 mg/dl   Random glucose today elevated.   Discussed with patient it is impossible to help him if he does not take medication like prescribed.discussed his risk for CV event is very high with medication and without medication MUCH higher.  Pt is aware and wants to be better. He seems very focused on sleep and "if he could just sleep he could start working on all the other things".   I will switch up sleep medication from belsomnra to Rozerem. Follow up with PCP in 1 month. Discussed good sleep routine, white noise, essential oils.   Also stressed to start with sugar lowering medications if he is not willing to start all the prescribed medications. Start with metformin and glipizide.   Chronic smoker-not willing to quit.   Clearly depressed not willing to consider medications for that. Encouraged sunlight and exercise(walking).   Marland Kitchen.Spent 30 minutes with patient and greater than 50 percent of visit spent counseling patient regarding treatment plan.

## 2018-10-21 NOTE — Patient Instructions (Addendum)
Stop belsomra. Start Rozerem 1 hour before bed.

## 2018-10-29 ENCOUNTER — Other Ambulatory Visit: Payer: Self-pay | Admitting: Osteopathic Medicine

## 2018-10-29 NOTE — Telephone Encounter (Signed)
Requested medication (s) are due for refill today: yes  Requested medication (s) are on the active medication list: yes  Last refill:  09/10/2018  Future visit scheduled: yes  Notes to clinic:  Requesting a 1 year supply  Requested Prescriptions  Pending Prescriptions Disp Refills   lisinopril (ZESTRIL) 5 MG tablet [Pharmacy Med Name: LISINOPRIL  5MG   TAB] 90 tablet 3    Sig: TAKE 1 TABLET BY MOUTH  DAILY     Cardiovascular:  ACE Inhibitors Failed - 10/29/2018  7:43 AM      Failed - Valid encounter within last 6 months    Recent Outpatient Visits          1 week ago Diabetes mellitus without complication [facility])   [place] [facility], Royetta Car, PA-C   1 month ago Insomnia, unspecified type   [facility], Lanelle Bal, DO   1 month ago Mediastinal adenopathy   [place], Elson Areas, PA-C   2 months ago Mood disorder [facility])   [place], [place], PA-C   3 months ago Type 2 diabetes mellitus with hyperglycemia, without long-term current use of insulin [facility])   [place] [facility], Lanelle Bal, DO      Future Appointments            In 5 days Margaretha Seeds, MD [facility]           Passed - Cr in normal range and within 180 days    Creat  Date Value Ref Range Status  09/23/2018 1.17 0.70 - 1.25 mg/dL Final    Comment:    For patients >58 years of age, the reference limit for Creatinine is approximately 13% higher for people identified as African-American. .          Passed - K in normal range and within 180 days    Potassium  Date Value Ref Range Status  09/23/2018 4.8 3.5 - 5.3 mmol/L Final         Passed - Patient is not pregnant      Passed - Last BP in normal range    BP Readings from Last 1 Encounters:  10/21/18 124/81           glipiZIDE (GLUCOTROL) 10 MG tablet [Pharmacy Med Name: GLIPIZIDE  10MG   TAB] 180 tablet 3    Sig: TAKE 1 TABLET BY MOUTH  TWICE DAILY BEFORE A MEAL     Endocrinology:  Diabetes - Sulfonylureas Failed - 10/29/2018  7:43 AM      Failed - HBA1C is between 0 and 7.9 and within 180 days    HB A1C (BAYER DCA - WAIVED)  Date Value Ref Range Status  06/04/2017 7.4 (H) <7.0 % Final    Comment:                                          Diabetic Adult            <7.0                                       Healthy Adult  4.3 - 5.7                                                           (DCCT/NGSP) American Diabetes Association's Summary of Glycemic Recommendations for Adults with Diabetes: Hemoglobin A1c <7.0%. More stringent glycemic goals (A1c <6.0%) may further reduce complications at the cost of increased risk of hypoglycemia.    Hgb A1c MFr Bld  Date Value Ref Range Status  09/23/2018 8.6 (H) <5.7 % of total Hgb Final    Comment:    For someone without known diabetes, a hemoglobin A1c value of 6.5% or greater indicates that they may have  diabetes and this should be confirmed with a follow-up  test. . For someone with known diabetes, a value <7% indicates  that their diabetes is well controlled and a value  greater than or equal to 7% indicates suboptimal  control. A1c targets should be individualized based on  duration of diabetes, age, comorbid conditions, and  other considerations. . Currently, no consensus exists regarding use of hemoglobin A1c for diagnosis of diabetes for children. .          Failed - Valid encounter within last 6 months    Recent Outpatient Visits          1 week ago Diabetes mellitus without complication [facility])   [place] [facility], Royetta Car, PA-C   1 month ago Insomnia, unspecified type   [facility], Lanelle Bal, DO   1 month ago Mediastinal adenopathy   [facility], Elson Areas, PA-C   2 months ago Mood disorder [facility])   [place], Elson Areas, PA-C   3 months ago Type 2 diabetes mellitus with hyperglycemia, without long-term current use of insulin [facility])   [place] [facility], Lanelle Bal, DO      Future Appointments            In 5 days Margaretha Seeds, MD [facility]            atorvastatin (LIPITOR) 40 MG tablet [Pharmacy Med Name: ATORVASTATIN  40MG   TAB] 90 tablet 3    Sig: TAKE 1 TABLET BY MOUTH  DAILY     Cardiovascular:  Antilipid - Statins Failed - 10/29/2018  7:43 AM      Failed - HDL in normal range and within 360 days    HDL  Date Value Ref Range Status  09/23/2018 35 (L) > OR = 40 mg/dL Final  02/15/2017 47 >39 mg/dL Final         Failed - Triglycerides in normal range and within 360 days    Triglycerides  Date Value Ref Range Status  09/23/2018 219 (H) <150 mg/dL Final    Comment:    . If a non-fasting specimen was collected, consider repeat triglyceride testing on a fasting specimen if clinically indicated.  Yates Decamp et al. J. of Clin. Lipidol. [id number]. .          Failed - Valid encounter within last 12 months    Recent Outpatient Visits          1 week ago Diabetes mellitus without complication ([place])   [facility], Royetta Car, PA-C   1 month ago Insomnia, unspecified type   [facility], Lanelle Bal, DO   1 month ago Mediastinal adenopathy   [place] [facility], Elson Areas, PA-C   2 months ago Mood disorder [facility])   [place] [facility], [place], PA-C   3 months ago Type 2 diabetes mellitus with hyperglycemia, without long-term current use of insulin [facility])   [place] [facility], Lanelle Bal, DO      Future Appointments            In 5 days Margaretha Seeds, MD [facility]           Passed - Total Cholesterol in normal range and within 360 days    Cholesterol, Total  Date Value Ref Range Status  02/15/2017 239 (H) 100 - 199 mg/dL Final   Cholesterol  Date Value Ref Range Status  09/23/2018 147 <200 mg/dL Final         Passed - LDL in normal range and within 360 days    LDL Cholesterol (Calc)  Date Value Ref Range Status  09/23/2018 80 mg/dL (calc) Final    Comment:    Reference range: <100 . Desirable range <100 mg/dL for primary prevention;   <70 mg/dL for patients with CHD or diabetic patients  with > or = 2 CHD risk factors. Marland Kitchen LDL-C is now calculated using the Martin-Hopkins  calculation, which is a validated novel method providing  better accuracy than the Friedewald equation in the  estimation of LDL-C.  Cresenciano Genre et al. Annamaria Helling. MU:[id number]): 2061-2068  (http://education.[employer].com/faq/FAQ164)          Passed - Patient is not pregnant

## 2018-11-03 ENCOUNTER — Ambulatory Visit: Payer: Medicare Other | Admitting: Pulmonary Disease

## 2018-11-03 ENCOUNTER — Ambulatory Visit (INDEPENDENT_AMBULATORY_CARE_PROVIDER_SITE_OTHER): Payer: Medicare Other | Admitting: Osteopathic Medicine

## 2018-11-03 ENCOUNTER — Other Ambulatory Visit: Payer: Self-pay

## 2018-11-03 ENCOUNTER — Encounter: Payer: Self-pay | Admitting: Osteopathic Medicine

## 2018-11-03 VITALS — BP 122/78 | HR 64 | Temp 98.1°F | Wt 209.0 lb

## 2018-11-03 DIAGNOSIS — F332 Major depressive disorder, recurrent severe without psychotic features: Secondary | ICD-10-CM

## 2018-11-03 MED ORDER — TEMAZEPAM 22.5 MG PO CAPS: 22.5000 mg | ORAL_CAPSULE | Freq: Every evening | ORAL | 0 refills | Status: DC | PRN

## 2018-11-03 NOTE — Patient Instructions (Addendum)
For immediate mental health services:  Anthony [name], [address] [place], [place] [address], [phone]  Any emergency room  National Suicide Prevention Lifeline, 814-619-0583  I'd like you to seek an evaluation at [facility] in the next few days. This is a mental health facility that can assess you and your medications, talk about what you might need in terms of counseling/therapy, and can get you into intensive outpatient therapy to hopefully keep you out of the hospital. But, if you do need hospitalization, they will help with that, too.   I think we can cautiously restart the Temazepam. I sent a slightly higher dose. ONLY TAKE ONE per night! Work on increasing activity during the day as you are able - when you tire your body out a bit you'll hopefully sleep better.

## 2018-11-03 NOTE — Progress Notes (Signed)
HPI: Anthony [name]. is a 68 y.o. male who  has a past medical history of Anxiety, Arthritis, Bipolar disorder ([place]), CAD (coronary artery disease), COPD (chronic obstructive pulmonary disease) ([place]), Depression, Glucose intolerance (impaired glucose tolerance), History of DVT (deep vein thrombosis), HLD (hyperlipidemia), Hypertension, Myocardial infarction [facility]), Shortness of breath, Tobacco abuse, Urinary frequency, and Vitamin D deficiency (09/05/2018).  he presents to [facility] today, 11/03/18,  for chief complaint of: insomnia   Patient has been dealing with sleep issues for several years. He was recently switched to ramelteon 8 mg from belsomra. He reports that it has not worked. He gets about 4-5 hours on a "good night" but typical nights consist of 1-2 hours of sleep. Will go > 24 hours w/o sleep up to 5x/month.  He has been on ambien, and temazepam in the past. Regularly takes dramamine/melatonin. Sleep hygeine includes going to bed at midnight, turning the lights/TV off at 2AM, using white noise. Says the only thing that has worked was 2-3 pills of temazepam 15 mg  to allow him to sleep. This Rx was stopped a few mos ago when he noted he was taking 2-3 x prescribed dose.   Patient endorses severe depression and anxiety. Says he feels "crappy" and like he is "falling behind." Has trouble enjoying activities, feels that he can't get a hold of a normal routine. Presently has intrusive thoughts of death but no active plan for suicide. Has recurrent thoughts of "slitting his throat" and feels that he is getting to "that edge". He feels useless and tired.    Past medical, surgical, social and family history reviewed and updated as necessary.   Current medication list and allergy/intolerance information reviewed:    Current Outpatient Medications  Medication Sig Dispense Refill  . albuterol (PROVENTIL HFA;VENTOLIN HFA) 108 (90 Base) MCG/ACT inhaler Inhale 2  puffs into the lungs every 4 (four) hours as needed for wheezing or shortness of breath (AS NEEDED - RESCUE INHALER). 1 Inhaler 1  . aspirin EC 81 MG tablet Take 1 tablet (81 mg total) by mouth daily. 90 tablet 3  . atorvastatin (LIPITOR) 40 MG tablet TAKE 1 TABLET BY MOUTH  DAILY 90 tablet 0  . baclofen (LIORESAL) 10 MG tablet Take 1 tablet (10 mg total) by mouth 2 (two) times daily. 180 tablet 0  . blood glucose meter kit and supplies KIT Dispense based on patient and insurance preference. Use to check BG once daily or every other other.  Dx: E11.9 1 each 0  . diclofenac (VOLTAREN) 75 MG EC tablet Take 1 tablet (75 mg total) by mouth 2 (two) times daily. 60 tablet 2  . ergocalciferol (VITAMIN D2) 1.25 MG (50000 UT) capsule Tale 1 capsule by mouth weekly for 4 weeks, then 1 capsule every 30 days for maintenance 6 capsule 1  . fluticasone (FLONASE) 50 MCG/ACT nasal spray Place 2 sprays into both nostrils daily. 16 g 6  . gabapentin (NEURONTIN) 300 MG capsule Take 1 capsule (300 mg total) by mouth 3 (three) times daily. AS NEEDED FOR NERVE PAIN 180 capsule 3  . glipiZIDE (GLUCOTROL) 10 MG tablet TAKE 1 TABLET BY MOUTH  TWICE DAILY BEFORE A MEAL 180 tablet 0  . ipratropium-albuterol (DUONEB) 0.5-2.5 (3) MG/3ML SOLN Take 3 mLs by nebulization every 4 (four) hours as needed (wheeze, cough, short of breath. Max use 6 vials per day). 360 mL 99  . lisinopril (ZESTRIL) 5 MG tablet TAKE 1 TABLET BY MOUTH  DAILY 90 tablet 0  . metFORMIN (GLUCOPHAGE-XR) 500 MG 24 hr tablet Take 2 tablets (1,000 mg total) by mouth daily with breakfast. Short term until mail order arrives 60 tablet 0  . metoprolol succinate (TOPROL-XL) 25 MG 24 hr tablet Take 0.5 tablets (12.5 mg total) by mouth daily. 90 tablet 0  . mirtazapine (REMERON) 30 MG tablet Take 0.5 tablets (15 mg total) by mouth at bedtime. 90 tablet 0  . Nebulizers (NEBULIZER COMPRESSOR) MISC Per insurance coverage and patient preference. Dx COPD 1 each 1  .  ramelteon (ROZEREM) 8 MG tablet Take 1 tablet (8 mg total) by mouth at bedtime. 30 tablet 2  . Respiratory Therapy Supplies (NEBULIZER/TUBING/MOUTHPIECE) KIT Per insurance coverage and patient preference. Dx COPD 1 each 23  . sennosides-docusate sodium (SENOKOT-S) 8.6-50 MG tablet Take 1 tablet by mouth 3 (three) times daily as needed for constipation.     . sildenafil (VIAGRA) 100 MG tablet Take 0.5-1 tablets (50-100 mg total) by mouth daily as needed for erectile dysfunction. 10 tablet 11  . Tiotropium Bromide Monohydrate (SPIRIVA RESPIMAT) 2.5 MCG/ACT AERS Inhale 2 puffs into the lungs daily. 4 g 0   No current facility-administered medications for this visit.     Allergies  Allergen Reactions  . Flomax [Tamsulosin Hcl] Other (See Comments)    This medication makes patient feel sick  . Penicillins Other (See Comments)    Reaction unknown occurred during childhood Has patient had a PCN reaction causing immediate rash, facial/tongue/throat swelling, SOB or lightheadedness with hypotension: unsure - childhood reaction Has patient had a PCN reaction causing severe rash involving mucus membranes or skin necrosis: unsure - childhood reaction Has patient had a PCN reaction that required hospitalization no Has patient had a PCN reaction occurring within the last 10 years: no If all of the above answers are "NO", then may p      Review of Systems:  Constitutional:  Insomnia and fatigue per HPI.   Respiratory:  No  shortness of breath. No  Cough  Musculoskeletal: No new myalgia/arthralgia  Neurologic: No  weakness, No  Dizziness  Psych: Depression and Anxiety per HPI.   Exam:  BP 122/78 (BP Location: Right Arm, Patient Position: Sitting, Cuff Size: Normal)   Pulse 64   Temp 98.1 F (36.7 C) (Oral)   Wt 209 lb (94.8 kg)   BMI 30.42 kg/m   Constitutional: VS see above. General Appearance: alert, well-developed, well-nourished, NAD  Cardiovascular: S1/S2 normal, no murmur, no  rub/gallop auscultated. RRR. No lower extremity edema.   Musculoskeletal: Gait normal.   Neurological: Normal balance/coordination. No tremor.   Skin: warm, dry, intact.   Psychiatric: Poor judgment/insight. Depressed mood and dysphoric, constricted affect. Logical thought process with suicidal ideation.    No results found for this or any previous visit (from the past 72 hour(s)).  No results found.   ASSESSMENT/PLAN: The encounter diagnosis was Severe episode of recurrent major depressive disorder, without psychotic features ([place]).   Insomnia - will re-start Restoril 22.5 mg at bedtime for sleep  Depression/Anxiety - Denies an active plan to complete suicide. Advised patient to be seen at [facility] as soon as possible, patient seems agreeable and wanting to get help. Advised patient to call 911 if increasing thoughts of self harm. Pt is verbally contracted for safety. He has a plan for laer today to get his phone situation fixed (lost his cell phone, has a "burner phone" doesn't know the number), I will be calling  him in the next day or two to see where he is at re: [facility] [place], advised him I'd call the [facility] for safety check if needed. He is in pretty good spirits, joking, seems to be thinking clearly though he is obviously depressed and I am concerned. I do not think he is a danger to himself at this time, though his suicidal thoughts are absolutely worrisome.  Plan to follow up in 2 weeks for mood/insomnia re-check.   Meds ordered this encounter  Medications  . temazepam (RESTORIL) 22.5 MG capsule    Sig: Take 1 capsule (22.5 mg total) by mouth at bedtime as needed for sleep.    Dispense:  15 capsule    Refill:  0    No orders of the defined types were placed in this encounter.   Patient Instructions  For immediate mental health services:  [facility], [address] [place], [place] [address], [phone]  Any emergency  room  National Suicide Prevention [facility]eline, [phone]  I'd like you to seek an evaluation at [facility] in the next few days. This is a mental health facility that can assess you and your medications, talk about what you might need in terms of counseling/therapy, and can get you into intensive outpatient therapy to hopefully keep you out of the hospital. But, if you do need hospitalization, they will help with that, too.   I think we can cautiously restart the Temazepam. I sent a slightly higher dose. ONLY TAKE ONE per night! Work on increasing activity during the day as you are able - when you tire your body out a bit you'll hopefully sleep better.            Visit summary with medication list and pertinent instructions was printed for patient to review. All questions at time of visit were answered - patient instructed to contact office with any additional concerns or updates. ER/RTC precautions were reviewed with the patient.   Follow-up plan: Return for 2 weeks, recheck insomnia. Dr A will call you next few days, make sure we have a good phone # .   Please note: voice recognition software was used to produce this document, and typos may escape review. Please contact Dr. Sheppard Coil for any needed clarifications.

## 2018-11-04 ENCOUNTER — Telehealth: Payer: Self-pay

## 2018-11-04 NOTE — Telephone Encounter (Signed)
Pt called stating he forgot to ask provider to refill his tramadol rx. Pls send to local pharmacy. Thanks.

## 2018-11-05 MED ORDER — TRAMADOL HCL 50 MG PO TABS: 50.0000 mg | ORAL_TABLET | Freq: Every day | ORAL | 0 refills | Status: AC | PRN

## 2018-11-05 NOTE — Telephone Encounter (Signed)
OK to send (I was gong ot call him later today anyway to see if he's made contact w/ [facility] re: mental health evaluation? Can we ask if he's done this? Their number is [phone])

## 2018-11-05 NOTE — Telephone Encounter (Signed)
Attempted to reach patient at [phone] as requested. No answer. Unable to leave a vm msg - vm box not set up. Will try to contact pt before end of day.

## 2018-11-05 NOTE — Telephone Encounter (Signed)
Pt called a second time requesting an update on refill for tramadol. Pls advise, thanks.

## 2018-11-06 NOTE — Telephone Encounter (Signed)
Let us try reaching out to him again today, if you cannot get a hold of him, let me know!

## 2018-11-06 NOTE — Telephone Encounter (Signed)
Second attempt to reach patient at [phone] as requested. No answer. No ringing, straight to vm, however unable to leave a vm msg - vm box not set up.

## 2018-11-07 NOTE — Telephone Encounter (Signed)
Left message on daughters voicemail for a return call from her or her dad.

## 2018-11-07 NOTE — Telephone Encounter (Signed)
I called him again  11:45 AM 11/07/18 at the number [place] listed, [phone] and it went to alert that voicemail hadn't been set up.   I called his regular listed number [phone] (he told me a few days ago he'd lost his cell phone, I thought maybe he had it back?) and it rang a few times and went to busy signal 11/07/18 11:48 AM.   I called his daughter Kerin Ransom at [phone] which is the only other number we had on file for him and left a voicemail at 11/07/18 11:48 AM to reach out to [place] and update [place] on his situation, or have him call us. I did NOT leave any information w/ regard to issues discussed at most recent visit.   I reached out to  [place] at [phone] and asked them to do a safety check at pt's listed address [address] [place], I have my cell phone number for officers to call me back with any updates. This was 11/07/18 11:57 AM   Officer [place] w/ [employer] called me back at 12:13 and said n one answered the door, no one appeared to be home.   Will see if daughter / patient calls back!

## 2018-11-10 NOTE — Telephone Encounter (Signed)
Glad to hear it. 

## 2018-11-10 NOTE — Telephone Encounter (Addendum)
Spoke to pt's daughter Jeannetta Nap today. As per daughter, pt is currently under care in [place] since 11/03/18. She was unable to inform me the name of the provider who is working with pt. She was also unaware how long pt will be staying at the facility. She stated she has been unable to get any information or update regarding pt since going to [facility]. No other inquiries during the call.

## 2018-11-11 ENCOUNTER — Telehealth: Payer: Self-pay | Admitting: Osteopathic Medicine

## 2018-11-11 NOTE — Telephone Encounter (Signed)
Patient called, stating that someone has reached out to his daughter a few times and he gave a current contact number of [phone] (I also added this current cell phone number in his chart as well). Patient wanted a call back regarding this. Please Advise.

## 2018-11-11 NOTE — Telephone Encounter (Signed)
Noted. Thanks for the update. The calls that were made to the pt were for a wellness check. We were unable to reach him via his new number. Provider was unable to leave a vm for the pt due to vm not being set up. Which is why his emergency contact (daughter) was reached out to by Dr. Sheppard Coil. Attempted to contact pt today, no answer. Unfortunately unable to leave a vm at this time. If pt should call again, pls let me know and transfer him to my line. Thanks again.

## 2018-11-13 ENCOUNTER — Encounter: Payer: Medicare Other | Admitting: Sports Medicine

## 2018-11-14 ENCOUNTER — Ambulatory Visit (INDEPENDENT_AMBULATORY_CARE_PROVIDER_SITE_OTHER): Payer: Medicare Other | Admitting: Sports Medicine

## 2018-11-14 ENCOUNTER — Other Ambulatory Visit: Payer: Self-pay

## 2018-11-14 ENCOUNTER — Encounter: Payer: Self-pay | Admitting: Sports Medicine

## 2018-11-14 DIAGNOSIS — M48062 Spinal stenosis, lumbar region with neurogenic claudication: Secondary | ICD-10-CM

## 2018-11-14 MED ORDER — DICLOFENAC SODIUM 75 MG PO TBEC: 75.0000 mg | DELAYED_RELEASE_TABLET | Freq: Two times a day (BID) | ORAL | 3 refills | Status: DC

## 2018-11-14 MED ORDER — GABAPENTIN 800 MG PO TABS: 800.0000 mg | ORAL_TABLET | Freq: Three times a day (TID) | ORAL | 3 refills | Status: DC

## 2018-11-14 NOTE — Progress Notes (Signed)
Subjective:    CC: Achiness in legs  HPI: This is a pleasant 68 year old male, he has a long history of achiness in both legs, his back feels pretty good but occasionally will hurt, he will get an aching sensation in the anterior thighs, anterolateral lower legs down to the feet.  He tells me he gets cramping and pain with walking and has to stop after a certain distance.  He did have a negative ABI in the recent past.  No bowel or bladder dysfunction, saddle numbness, constitutional symptoms.  I reviewed the past medical history, family history, social history, surgical history, and allergies today and no changes were needed.  Please see the problem list section below in epic for further details.  Past Medical History: Past Medical History:  Diagnosis Date  . Anxiety   . Arthritis   . Bipolar disorder ([place])   . CAD (coronary artery disease)    a. INF STEMI 07/04/10:  tx with thrombectomy + [facility] to [facility];  cath 07/04/10: dLM 10-20%, pLAD 40-50%, mLAD 20-30%, pRCA 30%, mRCA occluded and tx with PCI, EF 50% with inf HK. A Multilink  . COPD (chronic obstructive pulmonary disease) ([place])   . Depression   . Glucose intolerance (impaired glucose tolerance)    A1c 6.2 06/2010  . History of DVT (deep vein thrombosis)    traumatic, s/p coumadin tx.  Marland Kitchen HLD (hyperlipidemia)   . Hypertension    pt states is currently on no medications  . Myocardial infarction ([place])    around 2013  . Shortness of breath    walking distance or climbing stairs  . Tobacco abuse   . Urinary frequency   . Vitamin D deficiency 09/05/2018   Past Surgical History: Past Surgical History:  Procedure Laterality Date  . CARDIAC CATHETERIZATION    . COLONOSCOPY N/A 12/29/2015   Procedure: COLONOSCOPY;  Surgeon: Rogene Houston, MD;  Location: [facility];  Service: Endoscopy;  Laterality: N/A;  12:55  . ELECTROCARDIOGRAM     Showed inferolateral ST-elevation and a code STEMI was activated. In the ER, he was  treated with morphine, herarin, and 600 mg of Plavix. He was transferred emergently to [facility].   . INGUINAL HERNIA REPAIR Left 07/26/2014   Procedure: OPEN REPAIR OF RECURENT LEFT INGUINAL HERNIA REPAIR WITH MESH;  Surgeon: Greer Pickerel, MD;  Location: [facility] OR[facility];  Service: General;  Laterality: Left;  . INSERTION OF MESH Bilateral 10/30/2013   Procedure: INSERTION OF MESH;  Surgeon: Gayland Curry, MD;  Location: [facility];  Service: General;  Laterality: Bilateral;  . LAPAROSCOPIC INGUINAL HERNIA WITH UMBILICAL HERNIA Bilateral 10/30/2013   Procedure: laparoscopic repair left pantaloom hernia with mesh, laparoscopic right inguinal hernia with mesh, OPEN REPAIR OF UMBILICAL HERNIA ;  Surgeon: Gayland Curry, MD;  Location: [facility];  Service: General;  Laterality: Bilateral;  . LAPAROSCOPY N/A 07/26/2014   Procedure: LAPAROSCOPY DIAGNOSTIC;  Surgeon: Greer Pickerel, MD;  Location: [facility];  Service: General;  Laterality: N/A;  . POLYPECTOMY  12/29/2015   Procedure: POLYPECTOMY;  Surgeon: Rogene Houston, MD;  [facility];  Service: Endoscopy;;  ascending colon, descending colon  . stent placement    . TOTAL HIP ARTHROPLASTY Right 05/10/2015   Procedure: RIGHT TOTAL HIP ARTHROPLASTY ANTERIOR APPROACH;  Surgeon: Paralee Cancel, MD;  Location: [facility];  Service: Orthopedics;  Laterality: Right;  Marland Kitchen VASECTOMY     Social History: Social History   Socioeconomic History  .  Marital status: Divorced    Spouse name: Not on file  . Number of children: Not on file  . Years of education: Not on file  . Highest education level: Not on file  Occupational History  . Not on file  Social Needs  . Financial resource strain: Not on file  . Food insecurity    Worry: Not on file    Inability: Not on file  . Transportation needs    Medical: Not on file    Non-medical: Not on file  Tobacco Use  . Smoking status: Current Every Day Smoker    Packs/day: 1.50    Years: 45.00    Pack years: 67.50     Types: Cigarettes  . Smokeless tobacco: Never Used  Substance and Sexual Activity  . Alcohol use: No    Comment: past hx of ETOH use was in inpt facility per court order  - 20 years, 10-18-2015 per pt not anymore,per pt stopped about 15-20 yrs ago  . Drug use: No    Types: Marijuana, Heroin, Cocaine    Comment: snorted heroin and cocaine, 10-18-2015 per pt in the past he did Marijuana only and nothing else  . Sexual activity: Not on file  Lifestyle  . Physical activity    Days per week: Not on file    Minutes per session: Not on file  . Stress: Not on file  Relationships  . Social Herbalist on phone: Not on file    Gets together: Not on file    Attends religious service: Not on file    Active member of club or organization: Not on file    Attends meetings of clubs or organizations: Not on file    Relationship status: Not on file  Other Topics Concern  . Not on file  Social History Narrative   Lives in [place]. He lives alone. He has kids but is not married.    Family History: Family History  Problem Relation Age of Onset  . Coronary artery disease Father   . Depression Father   . Heart attack Father   . Depression Mother    Allergies: Allergies  Allergen Reactions  . Flomax [Tamsulosin Hcl] Other (See Comments)    This medication makes patient feel sick  . Penicillins Other (See Comments)    Reaction unknown occurred during childhood Has patient had a PCN reaction causing immediate rash, facial/tongue/throat swelling, SOB or lightheadedness with hypotension: unsure - childhood reaction Has patient had a PCN reaction causing severe rash involving mucus membranes or skin necrosis: unsure - childhood reaction Has patient had a PCN reaction that required hospitalization no Has patient had a PCN reaction occurring within the last 10 years: no If all of the above answers are "NO", then may p   Medications: See med rec.  Review of Systems: No fevers, chills,  night sweats, weight loss, chest pain, or shortness of breath.   Objective:    General: Well Developed, well nourished, and in no acute distress.  Neuro: Alert and oriented x3, extra-ocular muscles intact, sensation grossly intact.  HEENT: Normocephalic, atraumatic, pupils equal round reactive to light, neck supple, no masses, no lymphadenopathy, thyroid nonpalpable.  Skin: Warm and dry, no rashes. Cardiac: Regular rate and rhythm, no murmurs rubs or gallops, no lower extremity edema.  Respiratory: Clear to auscultation bilaterally. Not using accessory muscles, speaking in full sentences.  CT scan from the recent past shows moderate L4-L5 central canal stenosis.  Impression and  Recommendations:    Spinal stenosis of lumbar region with neurogenic claudication [place] has a bit of pain in his low back that improves with flexion, he also has cramping in both legs when walking consistent with neurogenic claudication. CT scan from the past showed moderate L4-L5 central canal stenosis. Last year he had an ABI that did not show evidence of vascular claudication. Increasing gabapentin 800 mg 3 times daily. Restart diclofenac. Adding formal physical therapy for 6 weeks. Return to see me in 6 weeks, we can proceed with MRI and epidural if no better.   ___________________________________________ Gwen Her. Dianah Field, M.D., ABFM., CAQSM. Primary Care and Sports Medicine [facility]  Adjunct Professor of [employer]

## 2018-11-14 NOTE — Assessment & Plan Note (Signed)
Jaedin has a bit of pain in his low back that improves with flexion, he also has cramping in both legs when walking consistent with neurogenic claudication. CT scan from the past showed moderate L4-L5 central canal stenosis. Last year he had an ABI that did not show evidence of vascular claudication. Increasing gabapentin 800 mg 3 times daily. Restart diclofenac. Adding formal physical therapy for 6 weeks. Return to see me in 6 weeks, we can proceed with MRI and epidural if no better.

## 2018-11-18 ENCOUNTER — Encounter: Payer: Self-pay | Admitting: Physical Therapy

## 2018-11-18 ENCOUNTER — Other Ambulatory Visit: Payer: Self-pay

## 2018-11-18 ENCOUNTER — Telehealth: Payer: Self-pay

## 2018-11-18 ENCOUNTER — Ambulatory Visit: Payer: Medicare Other | Admitting: Osteopathic Medicine

## 2018-11-18 ENCOUNTER — Ambulatory Visit (INDEPENDENT_AMBULATORY_CARE_PROVIDER_SITE_OTHER): Payer: Medicare Other | Admitting: Physical Therapy

## 2018-11-18 DIAGNOSIS — M545 Low back pain, unspecified: Secondary | ICD-10-CM

## 2018-11-18 DIAGNOSIS — M6281 Muscle weakness (generalized): Secondary | ICD-10-CM

## 2018-11-18 DIAGNOSIS — R29898 Other symptoms and signs involving the musculoskeletal system: Secondary | ICD-10-CM | POA: Diagnosis not present

## 2018-11-18 NOTE — Patient Instructions (Signed)
Access Code: [email or web address]  URL: [email or web address]  Date: 11/18/2018  Prepared by: Madelyn Flavors   Exercises Supine Bridge - 10 reps - 1-2 sets - 5 sec hold - 2x daily - 7x weekly Supine Hamstring Stretch with Strap - 3 reps - 1 sets - 60 sec hold - 2x daily - 7x weekly

## 2018-11-18 NOTE — Therapy (Signed)
[place], [place], [address] Phone: [phone]   Fax:  [phone]  Physical Therapy Evaluation  Patient Details  Name: Anthony [name]. MRN: [id number] Date of Birth: Sep 08, 1950 Referring Provider (PT): Dr. Earma Reading   Encounter Date: 11/18/2018  PT End of Session - 11/18/18 0849    Visit Number  1    Number of Visits  12    Date for PT Re-Evaluation  12/30/18    PT Start Time  0850    PT Stop Time  0930    PT Time Calculation (min)  40 min    Activity Tolerance  Patient tolerated treatment well    Behavior During Therapy  [facility] for tasks assessed/performed       Past Medical History:  Diagnosis Date  . Anxiety   . Arthritis   . Bipolar disorder ([place])   . CAD (coronary artery disease)    a. INF STEMI 07/04/10:  tx with thrombectomy + [facility] BMS to [facility];  cath 07/04/10: dLM 10-20%, pLAD 40-50%, mLAD 20-30%, pRCA 30%, mRCA occluded and tx with PCI, EF 50% with inf HK. A Multilink  . COPD (chronic obstructive pulmonary disease) ([place])   . Depression   . Glucose intolerance (impaired glucose tolerance)    A1c 6.2 06/2010  . History of DVT (deep vein thrombosis)    traumatic, s/p coumadin tx.  Marland Kitchen HLD (hyperlipidemia)   . Hypertension    pt states is currently on no medications  . Myocardial infarction ([place])    around 2013  . Shortness of breath    walking distance or climbing stairs  . Tobacco abuse   . Urinary frequency   . Vitamin D deficiency 09/05/2018    Past Surgical History:  Procedure Laterality Date  . CARDIAC CATHETERIZATION    . COLONOSCOPY N/A 12/29/2015   Procedure: COLONOSCOPY;  Surgeon: Rogene Houston, MD;  [facility];  Service: Endoscopy;  Laterality: N/A;  12:55  . ELECTROCARDIOGRAM     Showed inferolateral ST-elevation and a code STEMI was activated. In the ER, he was treated with morphine, herarin, and 600 mg of Plavix. He was transferred emergently  to [facility].   . INGUINAL HERNIA REPAIR Left 07/26/2014   Procedure: OPEN REPAIR OF RECURENT LEFT INGUINAL HERNIA REPAIR WITH MESH;  Surgeon: Greer Pickerel, MD;  Location: [facility];  Service: General;  Laterality: Left;  . INSERTION OF MESH Bilateral 10/30/2013   Procedure: INSERTION OF MESH;  Surgeon: Gayland Curry, MD;  [facility];  Service: General;  Laterality: Bilateral;  . LAPAROSCOPIC INGUINAL HERNIA WITH UMBILICAL HERNIA Bilateral 10/30/2013   Procedure: laparoscopic repair left pantaloom hernia with mesh, laparoscopic right inguinal hernia with mesh, OPEN REPAIR OF UMBILICAL HERNIA ;  Surgeon: Gayland Curry, MD;  [facility];  Service: General;  Laterality: Bilateral;  . LAPAROSCOPY N/A 07/26/2014   Procedure: LAPAROSCOPY DIAGNOSTIC;  Surgeon: Greer Pickerel, MD;  [facility];  Service: General;  Laterality: N/A;  . POLYPECTOMY  12/29/2015   Procedure: POLYPECTOMY;  Surgeon: Rogene Houston, MD;  [facility];  Service: Endoscopy;;  ascending colon, descending colon  . stent placement    . TOTAL HIP ARTHROPLASTY Right 05/10/2015   Procedure: RIGHT TOTAL HIP ARTHROPLASTY ANTERIOR APPROACH;  Surgeon: Paralee Cancel, MD;  [facility];  Service: Orthopedics;  Laterality: Right;  Marland Kitchen VASECTOMY      There were no vitals filed for  this visit.   Subjective Assessment - 11/18/18 0859    Subjective  About 3-4 weeks ago pt began feeling pain in the lower back. He denies pain in back right now but gets pain in BLE to ankles. In the past week, his walking and pain has improved.    Pertinent History  COPD, CHF, MI, spinal stenosis, Rt THA, depression.    Limitations  Walking    How long can you walk comfortably?  300-400  yards then 10 sec break    Patient Stated Goals  get rid of leg pain and back pain    Currently in Pain?  Yes    Pain Score  2     Pain Location  Back    Pain Orientation  Lower    Pain Descriptors / Indicators  Aching    Pain Type  Acute pain     Pain Radiating Towards  BLE 3-4/10 today upt to 6-7/10    Pain Onset  1 to 4 weeks ago    Pain Frequency  Intermittent    Aggravating Factors   EOD, walking, stairs    Pain Relieving Factors  morning has less pain    Effect of Pain on Daily Activities  limits activity         [facility] PT Assessment - 11/18/18 0001      Assessment   Medical Diagnosis  spinal stenosis with neurogenic claudication    Referring Provider (PT)  Dr. Earma Reading    Onset Date/Surgical Date  10/21/18      Precautions   Precaution Comments  right THA      Restrictions   Weight Bearing Restrictions  No      Balance Screen   Has the patient fallen in the past 6 months  No    Has the patient had a decrease in activity level because of a fear of falling?   No    Is the patient reluctant to leave their home because of a fear of falling?   No      Home Film/video editor residence    [place] to enter    Entrance Stairs-Number of Steps  2    Entrance Stairs-Rails  None      Prior Function   Level of Independence  Independent    Vocation  Retired    Leisure  walking 4-5 miles [employer]      Observation/Other Assessments   Focus on Therapeutic Outcomes (FOTO)   46% limited      ROM / Strength   AROM / PROM / Strength  AROM;Strength      AROM   Overall AROM Comments  Lumbar WNL      Strength   Overall Strength Comments  right hip flex 4+/5, else 5/5: weakness in stabilizing muscles with MMT      Flexibility   Soft Tissue Assessment /Muscle Length  yes    Hamstrings  mild tightness bil    Quadriceps  right > left    Piriformis  WFL      Palpation   Spinal mobility  WNL    Palpation comment  unremarkable       6 Minute Walk- Baseline   6 Minute Walk- Baseline  yes   no pain increase; 15.5 laps around gym               Objective measurements completed on examination: See above  findings.              PT  Education - 11/18/18 1358    Education Details  HEP    Person(s) Educated  Patient    Methods  Explanation;Demonstration;Handout    Comprehension  Verbalized understanding;Returned demonstration          PT Long Term Goals - 11/18/18 1411      PT LONG TERM GOAL #1   Title  Patient able to ambulate 2 miles with 2/10 pain or less in BLE.    Time  6    Period  Weeks    Status  New    Target Date  12/30/18      PT LONG TERM GOAL #2   Title  Patient to demonstrated improved heel strike with gait and negative Trendelenberg.    Time  6    Period  Weeks    Status  New      PT LONG TERM GOAL #3   Title  Ind with HEP    Time  6    Period  Weeks    Status  New      PT LONG TERM GOAL #4   Title  Improve FOTO to </= 31% limitation    Time  6    Period  Weeks    Status  New             Plan - 11/18/18 1359    Clinical Impression Statement  Patient presents today with c/o intermittent low back pain with claudication into BLE to ankles. He is getting pain with ambulation after about 400 yds and has to stop for about 10 seconds and then continue. He previously could walk 4-5 miles without rest. He did the 6 min walk test as a measure of pain/distance today without c/o pain, however he does demostrate gait deviations including decreased heel strike, trendelenberg on the right and bil pronation of feet. Marland Kitchen He has good LE strength and ROM and his lumbar ROM is WFL. He does have core weakness with MMT and tightness in his hips. He will benefit from PT to address these deficits.    Personal Factors and Comorbidities  Age;Comorbidity 3+    Comorbidities  COPD, CHF, MI, spinal stenosis, Rt THA, depression.    Examination-Activity Limitations  Locomotion Level    Stability/Clinical Decision Making  Evolving/Moderate complexity    Clinical Decision Making  Low    Rehab Potential  Good    PT Frequency  2x / week    PT Duration  6 weeks    PT Treatment/Interventions  Electrical  Stimulation;Moist Heat;Cryotherapy;Traction;Balance training;Therapeutic exercise;Neuromuscular re-education;Patient/family education;Dry needling;Manual techniques;Taping;Gait training    PT Next Visit Plan  core/lumbar stab; functional gluteus med strengthening    PT Home Exercise Plan  [place]       Patient will benefit from skilled therapeutic intervention in order to improve the following deficits and impairments:  Abnormal gait, Pain, Decreased strength, Postural dysfunction, Impaired flexibility, Decreased activity tolerance  Visit Diagnosis: Acute bilateral low back pain, unspecified whether sciatica present - Plan: PT plan of care cert/re-cert  Other symptoms and signs involving the musculoskeletal system - Plan: PT plan of care cert/re-cert  Muscle weakness (generalized) - Plan: PT plan of care cert/re-cert     Problem List Patient Active Problem List   Diagnosis Date Noted  . Spinal stenosis of lumbar region with neurogenic claudication 11/14/2018  . Tobacco dependence 10/21/2018  . Vitamin D deficiency 09/05/2018  .  Elevated vitamin B12 level 09/05/2018  . Acute neutrophilia 09/05/2018  . Chronic fatigue 08/29/2018  . Generalized weakness 08/29/2018  . Multiple lung nodules 08/03/2018  . Insomnia 03/26/2017  . Aortic atherosclerosis ([place]) 10/19/2016  . Pain medication agreement signed 06/05/2016  . Opioid dependence ([place]) 06/05/2016  . Diabetes mellitus without complication ([place]) [id number]  . Obesity (BMI 30-39.9) 04/13/2016  . Rectal bleeding 11/23/2015  . Moderate episode of recurrent major depressive disorder ([place]) 10/18/2015  . S/P right THA, AA 05/10/2015  . Recurrent left inguinal hernia 07/26/2014  . Chronic right hip pain 06/23/2014  . S/P bilateral inguinal hernia repair 10/30/2013  . Bilateral inguinal hernia 05/28/2013  . Umbilical hernia [id number]  . Major psychotic depression, recurrent ([place]) 12/16/2012  . Inadequate anticoagulation 04/29/2012   . Mood disorder ([place])   . CAD (coronary artery disease)   . Hyperlipidemia associated with type 2 diabetes mellitus ([place])   . COPD (chronic obstructive pulmonary disease) ([place])   . Tobacco abuse     Madelyn Flavors PT 11/18/2018, 2:17 PM  [facility] [place], [place], [address] Phone: [phone]   Fax:  [phone]  Name: Anthony [name]. MRN: [id number] Date of Birth: 30-Oct-1950

## 2018-11-18 NOTE — Telephone Encounter (Signed)
At provider's request, attempted to reach patient at [phone] due to missed appt with provider today. No answer. Unable to leave a vm msg - vm box not set up.

## 2018-11-19 NOTE — Telephone Encounter (Signed)
Pt did returned a call back to clinic. Spoke with Deliah Boston. Pt was made aware appt was missed and that we were doing a wellness check. Pt informed to reschedule appt prn. No other inquiries during call.

## 2018-11-20 ENCOUNTER — Encounter: Payer: Medicare Other | Admitting: Physical Therapy

## 2018-11-24 ENCOUNTER — Ambulatory Visit (INDEPENDENT_AMBULATORY_CARE_PROVIDER_SITE_OTHER): Payer: Medicare Other | Admitting: Physical Therapy

## 2018-11-24 ENCOUNTER — Encounter: Payer: Self-pay | Admitting: Physical Therapy

## 2018-11-24 ENCOUNTER — Other Ambulatory Visit: Payer: Self-pay

## 2018-11-24 DIAGNOSIS — M545 Low back pain, unspecified: Secondary | ICD-10-CM

## 2018-11-24 DIAGNOSIS — R29898 Other symptoms and signs involving the musculoskeletal system: Secondary | ICD-10-CM

## 2018-11-24 DIAGNOSIS — M6281 Muscle weakness (generalized): Secondary | ICD-10-CM | POA: Diagnosis not present

## 2018-11-24 NOTE — Therapy (Signed)
[place], [place], [address] Phone: [phone]   Fax:  [phone]  Physical Therapy Treatment  Patient Details  Name: Anthony [name]. MRN: [id number] Date of Birth: 07/30/1950 Referring Provider (PT): Dr. Earma Reading   Encounter Date: 11/24/2018  PT End of Session - 11/24/18 1107    Visit Number  2    Number of Visits  12    Date for PT Re-Evaluation  12/30/18    PT Start Time  1102    PT Stop Time  1145    PT Time Calculation (min)  43 min    Activity Tolerance  Patient tolerated treatment well    Behavior During Therapy  [facility] for tasks assessed/performed       Past Medical History:  Diagnosis Date  . Anxiety   . Arthritis   . Bipolar disorder ([place])   . CAD (coronary artery disease)    a. INF STEMI 07/04/10:  tx with thrombectomy + [facility] to [facility];  cath 07/04/10: dLM 10-20%, pLAD 40-50%, mLAD 20-30%, pRCA 30%, mRCA occluded and tx with PCI, EF 50% with inf HK. A Multilink  . COPD (chronic obstructive pulmonary disease) ([place])   . Depression   . Glucose intolerance (impaired glucose tolerance)    A1c 6.2 06/2010  . History of DVT (deep vein thrombosis)    traumatic, s/p coumadin tx.  Marland Kitchen HLD (hyperlipidemia)   . Hypertension    pt states is currently on no medications  . Myocardial infarction ([place])    around 2013  . Shortness of breath    walking distance or climbing stairs  . Tobacco abuse   . Urinary frequency   . Vitamin D deficiency 09/05/2018    Past Surgical History:  Procedure Laterality Date  . CARDIAC CATHETERIZATION    . COLONOSCOPY N/A 12/29/2015   Procedure: COLONOSCOPY;  Surgeon: Rogene Houston, MD;  [facility];  Service: Endoscopy;  Laterality: N/A;  12:55  . ELECTROCARDIOGRAM     Showed inferolateral ST-elevation and a code STEMI was activated. In the ER, he was treated with morphine, herarin, and 600 mg of Plavix. He was transferred emergently to  [facility].   . INGUINAL HERNIA REPAIR Left 07/26/2014   Procedure: OPEN REPAIR OF RECURENT LEFT INGUINAL HERNIA REPAIR WITH MESH;  Surgeon: Greer Pickerel, MD;  [facility];  Service: General;  Laterality: Left;  . INSERTION OF MESH Bilateral 10/30/2013   Procedure: INSERTION OF MESH;  Surgeon: Gayland Curry, MD;  [facility];  Service: General;  Laterality: Bilateral;  . LAPAROSCOPIC INGUINAL HERNIA WITH UMBILICAL HERNIA Bilateral 10/30/2013   Procedure: laparoscopic repair left pantaloom hernia with mesh, laparoscopic right inguinal hernia with mesh, OPEN REPAIR OF UMBILICAL HERNIA ;  Surgeon: Gayland Curry, MD;  [facility]: [facility];  Service: General;  Laterality: Bilateral;  . LAPAROSCOPY N/A 07/26/2014   Procedure: LAPAROSCOPY DIAGNOSTIC;  Surgeon: Greer Pickerel, MD;  Location: [facility];  Service: General;  Laterality: N/A;  . POLYPECTOMY  12/29/2015   Procedure: POLYPECTOMY;  Surgeon: Rogene Houston, MD;  [facility];  Service: Endoscopy;;  ascending colon, descending colon  . stent placement    . TOTAL HIP ARTHROPLASTY Right 05/10/2015   Procedure: RIGHT TOTAL HIP ARTHROPLASTY ANTERIOR APPROACH;  Surgeon: Paralee Cancel, MD;  [facility];  Service: Orthopedics;  Laterality: Right;  Marland Kitchen VASECTOMY      There were no vitals filed for  this visit.  Subjective Assessment - 11/24/18 1107    Subjective  Pt reports he lost his folder with all his exercises and appts in it.  No changes since last visit.    Pertinent History  COPD, CHF, MI, spinal stenosis, Rt THA, depression.    Patient Stated Goals  get rid of leg pain and back pain    Currently in Pain?  Yes    Pain Score  4     Pain Location  Back    Pain Orientation  Lower    Pain Descriptors / Indicators  Aching    Aggravating Factors   walking, stairs, end of day    Pain Relieving Factors  medicine         [facility] PT Assessment - 11/24/18 0001      Assessment   Medical Diagnosis  spinal stenosis with  neurogenic claudication    Referring Provider (PT)  Dr. Earma Reading    Onset Date/Surgical Date  10/21/18       [facility] Adult PT Treatment/Exercise - 11/24/18 0001      Lumbar Exercises: Stretches   Passive Hamstring Stretch  Right;Left;2 reps;30 seconds   supine with strap   Prone on Elbows Stretch  3 reps;20 seconds      Lumbar Exercises: Aerobic   Nustep  L4: (arms/legs) 5.5 min for warm up .       Lumbar Exercises: Supine   Ab Set  5 reps;5 seconds    AB Set Limitations  cues to count out loud to avoid holding breath    Bent Knee Raise  10 reps   with ab set   Bridge  10 reps      Lumbar Exercises: Prone   Opposite Arm/Leg Raise  Right arm/Left leg;Left arm/Right leg;5 reps   2 sets     Modalities   Modalities  Electrical Stimulation;Moist Heat      Moist Heat Therapy   Number Minutes Moist Heat  10 Minutes    Moist Heat Location  Lumbar Spine      Electrical Stimulation   Electrical Stimulation Location  bilat lumbar paraspinals    Electrical Stimulation Action  IFC    Electrical Stimulation Parameters   to tolerance     Electrical Stimulation Goals  Pain             PT Education - 11/24/18 1157    Education Details  HEP    Person(s) Educated  Patient    Methods  Explanation;Demonstration;Tactile cues;Verbal cues;Handout    Comprehension  Returned demonstration;Verbalized understanding          PT Long Term Goals - 11/18/18 1411      PT LONG TERM GOAL #1   Title  Patient able to ambulate 2 miles with 2/10 pain or less in BLE.    Time  6    Period  Weeks    Status  New    Target Date  12/30/18      PT LONG TERM GOAL #2   Title  Patient to demonstrated improved heel strike with gait and negative Trendelenberg.    Time  6    Period  Weeks    Status  New      PT LONG TERM GOAL #3   Title  Ind with HEP    Time  6    Period  Weeks    Status  New      PT LONG TERM GOAL #4   Title  Improve  FOTO to </= 31% limitation    Time  6    Period   Weeks    Status  New            Plan - 11/24/18 1158    Clinical Impression Statement  Pt tolerated all exercises well, without increase in pain.  Pt reported slight decrease in pain after exercises and further reduction of pain with use of estim/MHP at end of session.  No goals met yet; only 2nd visit.    Personal Factors and Comorbidities  Age;Comorbidity 3+    Comorbidities  COPD, CHF, MI, spinal stenosis, Rt THA, depression.    Examination-Activity Limitations  Locomotion Level    Stability/Clinical Decision Making  Evolving/Moderate complexity    Rehab Potential  Good    PT Frequency  2x / week    PT Duration  6 weeks    PT Treatment/Interventions  Electrical Stimulation;Moist Heat;Cryotherapy;Traction;Balance training;Therapeutic exercise;Neuromuscular re-education;Patient/family education;Dry needling;Manual techniques;Taping;Gait training    PT Next Visit Plan  core/lumbar stab; functional gluteus med strengthening    PT Home Exercise Plan  [place]       Patient will benefit from skilled therapeutic intervention in order to improve the following deficits and impairments:  Abnormal gait, Pain, Decreased strength, Postural dysfunction, Impaired flexibility, Decreased activity tolerance  Visit Diagnosis: Acute bilateral low back pain, unspecified whether sciatica present  Other symptoms and signs involving the musculoskeletal system  Muscle weakness (generalized)     Problem List Patient Active Problem List   Diagnosis Date Noted  . Spinal stenosis of lumbar region with neurogenic claudication 11/14/2018  . Tobacco dependence 10/21/2018  . Vitamin D deficiency 09/05/2018  . Elevated vitamin B12 level 09/05/2018  . Acute neutrophilia 09/05/2018  . Chronic fatigue 08/29/2018  . Generalized weakness 08/29/2018  . Multiple lung nodules 08/03/2018  . Insomnia 03/26/2017  . Aortic atherosclerosis ([place]) 10/19/2016  . Pain medication agreement signed 06/05/2016  .  Opioid dependence ([place]) 06/05/2016  . Diabetes mellitus without complication ([place]) 25/75/0518  . Obesity (BMI 30-39.9) 04/13/2016  . Rectal bleeding 11/23/2015  . Moderate episode of recurrent major depressive disorder ([place]) 10/18/2015  . S/P right THA, AA 05/10/2015  . Recurrent left inguinal hernia 07/26/2014  . Chronic right hip pain 06/23/2014  . S/P bilateral inguinal hernia repair 10/30/2013  . Bilateral inguinal hernia 05/28/2013  . Umbilical hernia 33/58/2518  . Major psychotic depression, recurrent ([place]) 12/16/2012  . Inadequate anticoagulation 04/29/2012  . Mood disorder ([place])   . CAD (coronary artery disease)   . Hyperlipidemia associated with type 2 diabetes mellitus ([place])   . COPD (chronic obstructive pulmonary disease) ([place])   . Tobacco abuse    Kerin Perna, [place] 11/24/18 12:03 PM  [address] [place], [place], [address] Phone: [phone]   Fax:  [phone]  Name: Florencio Hollibaugh. MRN: [id number] Date of Birth: 04-18-1950

## 2018-11-24 NOTE — Patient Instructions (Signed)
Access Code: [email or web address]  URL: [email or web address]  Date: 11/24/2018  Prepared by: Kerin Perna   Exercises  Supine Transversus Abdominis Bracing - Hands on Stomach - 10 reps - 1 sets - 5 hold - 1x daily - 7x weekly  Supine Bridge - 10 reps - 1-2 sets - 5 sec hold - 2x daily - 7x weekly  Hooklying Hamstring Stretch with Strap - 3 reps - 1 sets - 30 seconds hold - 2x daily - 7x weekly  Prone Press Up on Elbows - 5 reps - 1 sets - 20 hold - 1x daily - 7x weekly  Prone Alternating Arm and Leg Lifts - 5 reps - 2 sets - 1x daily - 3x weekly

## 2018-11-26 ENCOUNTER — Ambulatory Visit (INDEPENDENT_AMBULATORY_CARE_PROVIDER_SITE_OTHER): Payer: Medicare Other | Admitting: Physical Therapy

## 2018-11-26 ENCOUNTER — Other Ambulatory Visit: Payer: Self-pay

## 2018-11-26 DIAGNOSIS — M6281 Muscle weakness (generalized): Secondary | ICD-10-CM | POA: Diagnosis not present

## 2018-11-26 DIAGNOSIS — M545 Low back pain, unspecified: Secondary | ICD-10-CM

## 2018-11-26 DIAGNOSIS — R29898 Other symptoms and signs involving the musculoskeletal system: Secondary | ICD-10-CM

## 2018-11-26 NOTE — Therapy (Signed)
[place], [place], [address] Phone: [phone]   Fax:  [phone]  Physical Therapy Treatment  Patient Details  Name: Nagee Prey. MRN: [id number] Date of Birth: 14-Jun-1950 Referring Provider (PT): Dr. Earma Reading   Encounter Date: 11/26/2018  PT End of Session - 11/26/18 1133    Visit Number  3    Number of Visits  12    Date for PT Re-Evaluation  12/30/18    PT Start Time  1100    PT Stop Time  1142    PT Time Calculation (min)  42 min    Activity Tolerance  Patient tolerated treatment well    Behavior During Therapy  [facility] for tasks assessed/performed       Past Medical History:  Diagnosis Date  . Anxiety   . Arthritis   . Bipolar disorder ([place])   . CAD (coronary artery disease)    a. INF STEMI 07/04/10:  tx with thrombectomy + [facility] BMS to [facility];  cath 07/04/10: dLM 10-20%, pLAD 40-50%, mLAD 20-30%, pRCA 30%, mRCA occluded and tx with PCI, EF 50% with inf HK. A Multilink  . COPD (chronic obstructive pulmonary disease) ([place])   . Depression   . Glucose intolerance (impaired glucose tolerance)    A1c 6.2 06/2010  . History of DVT (deep vein thrombosis)    traumatic, s/p coumadin tx.  Marland Kitchen HLD (hyperlipidemia)   . Hypertension    pt states is currently on no medications  . Myocardial infarction ([place])    around 2013  . Shortness of breath    walking distance or climbing stairs  . Tobacco abuse   . Urinary frequency   . Vitamin D deficiency 09/05/2018    Past Surgical History:  Procedure Laterality Date  . CARDIAC CATHETERIZATION    . COLONOSCOPY N/A 12/29/2015   Procedure: COLONOSCOPY;  Surgeon: Rogene Houston, MD;  [facility];  Service: Endoscopy;  Laterality: N/A;  12:55  . ELECTROCARDIOGRAM     Showed inferolateral ST-elevation and a code STEMI was activated. In the ER, he was treated with morphine, herarin, and 600 mg of Plavix. He was transferred emergently to  [facility].   . INGUINAL HERNIA REPAIR Left 07/26/2014   Procedure: OPEN REPAIR OF RECURENT LEFT INGUINAL HERNIA REPAIR WITH MESH;  Surgeon: Greer Pickerel, MD;  Location: [facility];  Service: General;  Laterality: Left;  . INSERTION OF MESH Bilateral 10/30/2013   Procedure: INSERTION OF MESH;  Surgeon: Gayland Curry, MD;  Location: [facility];  Service: General;  Laterality: Bilateral;  . LAPAROSCOPIC INGUINAL HERNIA WITH UMBILICAL HERNIA Bilateral 10/30/2013   Procedure: laparoscopic repair left pantaloom hernia with mesh, laparoscopic right inguinal hernia with mesh, OPEN REPAIR OF UMBILICAL HERNIA ;  Surgeon: Gayland Curry, MD;  [facility];  Service: General;  Laterality: Bilateral;  . LAPAROSCOPY N/A 07/26/2014   Procedure: LAPAROSCOPY DIAGNOSTIC;  Surgeon: Greer Pickerel, MD;  [facility];  Service: General;  Laterality: N/A;  . POLYPECTOMY  12/29/2015   Procedure: POLYPECTOMY;  Surgeon: Rogene Houston, MD;  [facility];  Service: Endoscopy;;  ascending colon, descending colon  . stent placement    . TOTAL HIP ARTHROPLASTY Right 05/10/2015   Procedure: RIGHT TOTAL HIP ARTHROPLASTY ANTERIOR APPROACH;  Surgeon: Paralee Cancel, MD;  [facility];  Service: Orthopedics;  Laterality: Right;  Marland Kitchen VASECTOMY      There were no vitals filed for  this visit.  Subjective Assessment - 11/26/18 1102    Subjective  Pt reports he was unable to comply with the doctors med changes.  He states he feels "sort of unbalanced" when he takes more than 600mg  of gabapentin.  He reports his back is feeling a little better; he attributes this to more activity and exercises.    Patient Stated Goals  get rid of leg pain and back pain    Currently in Pain?  Yes    Pain Score  2     Pain Location  Back    Pain Orientation  Lower    Pain Descriptors / Indicators  Patsi Sears PT Assessment - 11/26/18 0001      Assessment   Medical Diagnosis  spinal stenosis with neurogenic claudication     Referring Provider (PT)  Dr. Earma Reading    Onset Date/Surgical Date  10/21/18    Next MD Visit  12/26/18       [facility] Adult PT Treatment/Exercise - 11/26/18 0001      Lumbar Exercises: Stretches   Passive Hamstring Stretch  Right;Left;2 reps;30 seconds   supine with strap   Lower Trunk Rotation  5 reps;10 seconds    Prone on Elbows Stretch  3 reps;20 seconds    Piriformis Stretch  Right;Left;2 reps;20 seconds      Lumbar Exercises: Aerobic   Nustep  L5: (arms/legs) 6 min for warm up .     Other Aerobic Exercise  laps around gym after exercises to assess stiffness.       Lumbar Exercises: Seated   Sit to Stand  5 reps   2 sets with core engaged.      Lumbar Exercises: Supine   Ab Set  5 reps;5 seconds    AB Set Limitations  cues to breathe    Bent Knee Raise  10 reps   with ab set   Bridge  10 reps      Lumbar Exercises: Prone   Opposite Arm/Leg Raise  Right arm/Left leg;Left arm/Right leg;5 reps   2 sets     Modalities   Modalities  Electrical Stimulation;Moist Heat      Moist Heat Therapy   Number Minutes Moist Heat  10 Minutes    Moist Heat Location  Lumbar Spine      Electrical Stimulation   Electrical Stimulation Location  bilat lumbar paraspinals    Electrical Stimulation Action  IFC    Electrical Stimulation Parameters  to tolerance    Electrical Stimulation Goals  Pain                  PT Long Term Goals - 11/18/18 1411      PT LONG TERM GOAL #1   Title  Patient able to ambulate 2 miles with 2/10 pain or less in BLE.    Time  6    Period  Weeks    Status  New    Target Date  12/30/18      PT LONG TERM GOAL #2   Title  Patient to demonstrated improved heel strike with gait and negative Trendelenberg.    Time  6    Period  Weeks    Status  New      PT LONG TERM GOAL #3   Title  Ind with HEP    Time  6    Period  Weeks    Status  New  PT LONG TERM GOAL #4   Title  Improve FOTO to </= 31% limitation    Time  6    Period   Weeks    Status  New            Plan - 11/26/18 1114    Clinical Impression Statement  Pt now able to walk 600 yd before needing to stop; demonstrating improved tolerance for exercise.  Pt able to complete additional exercises today without difficulty.  Pt reported reduction of LBP by end of session.  Progressing towards goals.    Comorbidities  COPD, CHF, MI, spinal stenosis, Rt THA, depression.    Rehab Potential  Good    PT Frequency  2x / week    PT Duration  6 weeks    PT Treatment/Interventions  Electrical Stimulation;Moist Heat;Cryotherapy;Traction;Balance training;Therapeutic exercise;Neuromuscular re-education;Patient/family education;Dry needling;Manual techniques;Taping;Gait training    PT Next Visit Plan  core/lumbar stab; functional gluteus med strengthening    PT Home Exercise Plan  [place]       Patient will benefit from skilled therapeutic intervention in order to improve the following deficits and impairments:  Abnormal gait, Pain, Decreased strength, Postural dysfunction, Impaired flexibility, Decreased activity tolerance  Visit Diagnosis: Acute bilateral low back pain, unspecified whether sciatica present  Other symptoms and signs involving the musculoskeletal system  Muscle weakness (generalized)     Problem List Patient Active Problem List   Diagnosis Date Noted  . Spinal stenosis of lumbar region with neurogenic claudication 11/14/2018  . Tobacco dependence 10/21/2018  . Vitamin D deficiency 09/05/2018  . Elevated vitamin B12 level 09/05/2018  . Acute neutrophilia 09/05/2018  . Chronic fatigue 08/29/2018  . Generalized weakness 08/29/2018  . Multiple lung nodules 08/03/2018  . Insomnia 03/26/2017  . Aortic atherosclerosis ([place]) 10/19/2016  . Pain medication agreement signed 06/05/2016  . Opioid dependence ([place]) 06/05/2016  . Diabetes mellitus without complication ([place]) [id number]  . Obesity (BMI 30-39.9) 04/13/2016  . Rectal bleeding  11/23/2015  . Moderate episode of recurrent major depressive disorder ([place]) 10/18/2015  . S/P right THA, AA 05/10/2015  . Recurrent left inguinal hernia 07/26/2014  . Chronic right hip pain 06/23/2014  . S/P bilateral inguinal hernia repair 10/30/2013  . Bilateral inguinal hernia 05/28/2013  . Umbilical hernia [id number]  . Major psychotic depression, recurrent ([place]) 12/16/2012  . Inadequate anticoagulation 04/29/2012  . Mood disorder ([place])   . CAD (coronary artery disease)   . Hyperlipidemia associated with type 2 diabetes mellitus ([place])   . COPD (chronic obstructive pulmonary disease) ([place])   . Tobacco abuse    Kerin Perna, [place] 11/26/18 2:00 PM  [place], [place], [address] Phone: [phone]   Fax:  [phone]  Name: Teller Balgobin. MRN: [id number] Date of Birth: 09/05/1950

## 2018-11-28 ENCOUNTER — Ambulatory Visit (INDEPENDENT_AMBULATORY_CARE_PROVIDER_SITE_OTHER): Payer: Medicare Other | Admitting: Osteopathic Medicine

## 2018-11-28 ENCOUNTER — Encounter: Payer: Self-pay | Admitting: Osteopathic Medicine

## 2018-11-28 ENCOUNTER — Other Ambulatory Visit: Payer: Self-pay

## 2018-11-28 VITALS — BP 116/69 | HR 64 | Temp 97.4°F | Wt 210.0 lb

## 2018-11-28 DIAGNOSIS — F39 Unspecified mood [affective] disorder: Secondary | ICD-10-CM | POA: Diagnosis not present

## 2018-11-28 DIAGNOSIS — F99 Mental disorder, not otherwise specified: Secondary | ICD-10-CM | POA: Diagnosis not present

## 2018-11-28 DIAGNOSIS — F331 Major depressive disorder, recurrent, moderate: Secondary | ICD-10-CM | POA: Diagnosis not present

## 2018-11-28 DIAGNOSIS — F5105 Insomnia due to other mental disorder: Secondary | ICD-10-CM | POA: Diagnosis not present

## 2018-11-28 NOTE — Progress Notes (Signed)
HPI: Anthony [name]. is a 68 y.o. male who  has a past medical history of Anxiety, Arthritis, Bipolar disorder ([place]), CAD (coronary artery disease), COPD (chronic obstructive pulmonary disease) ([place]), Depression, Glucose intolerance (impaired glucose tolerance), History of DVT (deep vein thrombosis), HLD (hyperlipidemia), Hypertension, Myocardial infarction [facility]), Shortness of breath, Tobacco abuse, Urinary frequency, and Vitamin D deficiency (09/05/2018).  he presents to [facility] today, 11/28/18,  for chief complaint of:  Recheck insomnia   Doing better since out of [facility] (records requested). He is set up with psychiatric care at [facility] (records requested, PDMP reviewed and looks like he is getting Rx from Elsie Stain PA-C). From PDMP, can infer she is continuing w/ Temazepam 15 mg qhs, Rx for #30 filled 11/11/18.   Patient says he has been in pretty good spirits.  He was a bit surprised when he went to [facility] for an evaluation and they decided to keep him, but he is glad that he has gotten the help he needs and he is feeling all right at this point.  He says they changed his medications, but he is not exactly sure what was stopped/started other than "trazodone, that is the nighttime one."  Patient states that he has had a visit with psychiatric PA, that office apparently has some sort of policy where patients have to call the morning of desired appointment, they do not have appointment scheduled in advance?      At today's visit 11/28/18 ... PMH, PSH, FH reviewed and updated as needed.  Current medication list and allergy/intolerance hx reviewed and updated as needed. (See remainder of HPI, ROS, Phys Exam below)   No results found.  No results found for this or any previous visit (from the past 72 hour(s)).        ASSESSMENT/PLAN: The primary encounter diagnosis was Moderate episode of recurrent major depressive disorder ([place]).  Diagnoses of Mood disorder ([place]) and Insomnia due to other mental disorder were also pertinent to this visit.   We will request records, might be worthwhile to contact the pharmacy to see if we can get a medication list.  Psychaitric office Certus:  [id number]   Follow-up plan: Return in about 4 weeks (around 12/26/2018) for follow-up on mental health, see me sooner if needed .                                                 ################################################# ################################################# ################################################# #################################################    No outpatient medications have been marked as taking for the 11/28/18 encounter (Appointment) with Emeterio Reeve, DO.    Allergies  Allergen Reactions  . Flomax [Tamsulosin Hcl] Other (See Comments)    This medication makes patient feel sick  . Penicillins Other (See Comments)    Reaction unknown occurred during childhood Has patient had a PCN reaction causing immediate rash, facial/tongue/throat swelling, SOB or lightheadedness with hypotension: unsure - childhood reaction Has patient had a PCN reaction causing severe rash involving mucus membranes or skin necrosis: unsure - childhood reaction Has patient had a PCN reaction that required hospitalization no Has patient had a PCN reaction occurring within the last 10 years: no If all of the above answers are "NO", then may p       Review of Systems:  Constitutional: No recent illness  HEENT: No  headache,  no vision change  Cardiac: No  chest pain, No  pressure, No palpitations  Respiratory:  No  shortness of breath. +chronic smokers Cough  Neurologic: No  weakness, No  Dizziness  Psychiatric: No  concerns with depression, No  concerns with anxiety  Exam:  There were no vitals taken for this visit.  Constitutional: VS see above. General  Appearance: alert, well-developed, well-nourished, NAD  Neck: No masses, trachea midline.   Respiratory: Normal respiratory effort. no wheeze, no rhonchi, no rales  Cardiovascular: S1/S2 normal, no murmur, no rub/gallop auscultated. RRR.   Musculoskeletal: Gait normal. Symmetric and independent movement of all extremities  Neurological: Normal balance/coordination. No tremor.  Skin: warm, dry, intact.   Psychiatric: Normal judgment/insight. Normal mood and affect. Oriented x3.       Visit summary with medication list and pertinent instructions was printed for patient to review, patient was advised to alert [place] if any updates are needed. All questions at time of visit were answered - patient instructed to contact office with any additional concerns. ER/RTC precautions were reviewed with the patient and understanding verbalized.   Note: Total time spent 25 minutes, greater than 50% of the visit was spent face-to-face counseling and coordinating care for the following: The primary encounter diagnosis was Moderate episode of recurrent major depressive disorder ([place]). Diagnoses of Mood disorder ([place]) and Insomnia due to other mental disorder were also pertinent to this visit.Marland Kitchen  Please note: voice recognition software was used to produce this document, and typos may escape review. Please contact Dr. Sheppard Coil for any needed clarifications.    Follow up plan: Return in about 4 weeks (around 12/26/2018) for follow-up on mental health, see me sooner if needed .

## 2018-12-01 ENCOUNTER — Other Ambulatory Visit: Payer: Self-pay

## 2018-12-01 ENCOUNTER — Ambulatory Visit (INDEPENDENT_AMBULATORY_CARE_PROVIDER_SITE_OTHER): Payer: Medicare Other | Admitting: Physical Therapy

## 2018-12-01 DIAGNOSIS — M545 Low back pain, unspecified: Secondary | ICD-10-CM

## 2018-12-01 DIAGNOSIS — R29898 Other symptoms and signs involving the musculoskeletal system: Secondary | ICD-10-CM | POA: Diagnosis not present

## 2018-12-01 DIAGNOSIS — M6281 Muscle weakness (generalized): Secondary | ICD-10-CM | POA: Diagnosis not present

## 2018-12-01 NOTE — Therapy (Signed)
[place], [place], [address] Phone: [phone]   Fax:  [phone]  Physical Therapy Treatment  Patient Details  Name: Anthony [name]. MRN: [id number] Date of Birth: 02-Dec-1950 Referring Provider (PT): Dr. Earma Reading   Encounter Date: 12/01/2018  PT End of Session - 12/01/18 1021    Visit Number  4    Number of Visits  12    Date for PT Re-Evaluation  12/30/18    PT Start Time  1020    PT Stop Time  1058    PT Time Calculation (min)  38 min    Activity Tolerance  Patient tolerated treatment well    Behavior During Therapy  [facility] for tasks assessed/performed       Past Medical History:  Diagnosis Date  . Anxiety   . Arthritis   . Bipolar disorder ([place])   . CAD (coronary artery disease)    a. INF STEMI 07/04/10:  tx with thrombectomy + Vision BMS to [facility];  cath 07/04/10: dLM 10-20%, pLAD 40-50%, mLAD 20-30%, pRCA 30%, mRCA occluded and tx with PCI, EF 50% with inf HK. A Multilink  . COPD (chronic obstructive pulmonary disease) ([place])   . Depression   . Glucose intolerance (impaired glucose tolerance)    A1c 6.2 06/2010  . History of DVT (deep vein thrombosis)    traumatic, s/p coumadin tx.  Marland Kitchen HLD (hyperlipidemia)   . Hypertension    pt states is currently on no medications  . Myocardial infarction ([place])    around 2013  . Shortness of breath    walking distance or climbing stairs  . Tobacco abuse   . Urinary frequency   . Vitamin D deficiency 09/05/2018    Past Surgical History:  Procedure Laterality Date  . CARDIAC CATHETERIZATION    . COLONOSCOPY N/A 12/29/2015   Procedure: COLONOSCOPY;  Surgeon: Rogene [place], MD;  [facility];  Service: Endoscopy;  Laterality: N/A;  12:55  . ELECTROCARDIOGRAM     Showed inferolateral ST-elevation and a code STEMI was activated. In the ER, he was treated with morphine, herarin, and 600 mg of Plavix. He was transferred emergently to  [facility].   . INGUINAL HERNIA REPAIR Left 07/26/2014   Procedure: OPEN REPAIR OF RECURENT LEFT INGUINAL HERNIA REPAIR WITH MESH;  Surgeon: Greer Pickerel, MD;  [facility];  Service: General;  Laterality: Left;  . INSERTION OF MESH Bilateral 10/30/2013   Procedure: INSERTION OF MESH;  Surgeon: Gayland Curry, MD;  [facility];  Service: General;  Laterality: Bilateral;  . LAPAROSCOPIC INGUINAL HERNIA WITH UMBILICAL HERNIA Bilateral 10/30/2013   Procedure: laparoscopic repair left pantaloom hernia with mesh, laparoscopic right inguinal hernia with mesh, OPEN REPAIR OF UMBILICAL HERNIA ;  Surgeon: Gayland Curry, MD;  [facility];  Service: General;  Laterality: Bilateral;  . LAPAROSCOPY N/A 07/26/2014   Procedure: LAPAROSCOPY DIAGNOSTIC;  Surgeon: Greer Pickerel, MD;  [facility];  Service: General;  Laterality: N/A;  . POLYPECTOMY  12/29/2015   Procedure: POLYPECTOMY;  Surgeon: Rogene Houston, MD;  [facility];  Service: Endoscopy;;  ascending colon, descending colon  . stent placement    . TOTAL HIP ARTHROPLASTY Right 05/10/2015   Procedure: RIGHT TOTAL HIP ARTHROPLASTY ANTERIOR APPROACH;  Surgeon: Paralee Cancel, MD;  [facility];  Service: Orthopedics;  Laterality: Right;  Marland Kitchen VASECTOMY      There were no vitals filed for  this visit.  Subjective Assessment - 12/01/18 1021    Subjective  Patient reports he has quit smoking. He has gone a week with no smoking. He states his pain is improving with walking.    Pertinent History  COPD, CHF, MI, spinal stenosis, Rt THA, depression.    Patient Stated Goals  get rid of leg pain and back pain    Currently in Pain?  Yes    Pain Score  1     Pain Location  Back    Pain Orientation  Lower    Pain Descriptors / Indicators  Sore    Pain Type  Acute pain                       [facility] Adult PT Treatment/Exercise - 12/01/18 0001      Ambulation/Gait   Gait Comments  4 walks around gym (80 ft ea)        Lumbar Exercises: Aerobic   Nustep  L5: (arms/legs) 6 min for warm up .     Other Aerobic Exercise  laps around gym after Nustep to assess stiffness.       Lumbar Exercises: Supine   Ab Set  5 reps;5 seconds    AB Set Limitations  counting outloud    Bent Knee Raise  10 reps   with ab set   Bridge  10 reps      Lumbar Exercises: Sidelying   Clam  Both;10 reps;5 seconds    Hip Abduction  Both;20 reps    Hip Abduction Limitations  then front back taps x 10 bil ; left weaker      Lumbar Exercises: Prone   Straight Leg Raise  10 reps   ea   Opposite Arm/Leg Raise  Right arm/Left leg;Left arm/Right leg;5 reps   2 sets     Lumbar Exercises: Quadruped   Straight Leg Raises Limitations  7 reps than fatigued; used body blade for feedback                  PT Long Term Goals - 12/01/18 1023      PT LONG TERM GOAL #1   Title  Patient able to ambulate 2 miles with 2/10 pain or less in BLE.    Baseline  walking 1/4 to 1/2 mile then pain    Status  On-going      PT LONG TERM GOAL #2   Title  Patient to demonstrated improved heel strike with gait and negative Trendelenberg.    Baseline  --    Status  On-going      PT LONG TERM GOAL #3   Title  Ind with HEP    Status  On-going            Plan - 12/01/18 1740    Clinical Impression Statement  Patient did well with core strengthening today although fatigued quickly in quadriped. He reports decreased pain overall with walking and in low back. He states he can walk 1/4 to 1/2 mile now before needing to stop due to pain. He pronates bil with gait and requires cues to increase heel strike especially after exercises. He says he has tried OTC orthotics in the past but doesn't like them.    Comorbidities  COPD, CHF, MI, spinal stenosis, Rt THA, depression.    PT Treatment/Interventions  Electrical Stimulation;Moist Heat;Cryotherapy;Traction;Balance training;Therapeutic exercise;Neuromuscular re-education;Patient/family  education;Dry needling;Manual techniques;Taping;Gait training    PT Next Visit Plan  core/lumbar stab;  functional gluteus med strengthening    PT Home Exercise Plan  [id number]       Patient will benefit from skilled therapeutic intervention in order to improve the following deficits and impairments:  Abnormal gait, Pain, Decreased strength, Postural dysfunction, Impaired flexibility, Decreased activity tolerance  Visit Diagnosis: Acute bilateral low back pain, unspecified whether sciatica present  Other symptoms and signs involving the musculoskeletal system  Muscle weakness (generalized)     Problem List Patient Active Problem List   Diagnosis Date Noted  . Spinal stenosis of lumbar region with neurogenic claudication 11/14/2018  . Tobacco dependence 10/21/2018  . Vitamin D deficiency 09/05/2018  . Elevated vitamin B12 level 09/05/2018  . Acute neutrophilia 09/05/2018  . Chronic fatigue 08/29/2018  . Generalized weakness 08/29/2018  . Multiple lung nodules 08/03/2018  . Insomnia 03/26/2017  . Aortic atherosclerosis ([place]) 10/19/2016  . Pain medication agreement signed 06/05/2016  . Opioid dependence ([place]) 06/05/2016  . Diabetes mellitus without complication ([place]) [id number]  . Obesity (BMI 30-39.9) 04/13/2016  . Rectal bleeding 11/23/2015  . Moderate episode of recurrent major depressive disorder ([place]) 10/18/2015  . S/P right THA, AA 05/10/2015  . Recurrent left inguinal hernia 07/26/2014  . Chronic right hip pain 06/23/2014  . S/P bilateral inguinal hernia repair 10/30/2013  . Bilateral inguinal hernia 05/28/2013  . Umbilical hernia [id number]  . Major psychotic depression, recurrent ([place]) 12/16/2012  . Inadequate anticoagulation 04/29/2012  . Mood disorder ([place])   . CAD (coronary artery disease)   . Hyperlipidemia associated with type 2 diabetes mellitus ([place])   . COPD (chronic obstructive pulmonary disease) ([place])   . Tobacco abuse     Madelyn Flavors  PT 12/01/2018, 5:47 PM  [facility] [place], [place], [address] Phone: [phone]   Fax:  [phone]  Name: Anthony [name]. MRN: [id number] Date of Birth: [birth year]

## 2018-12-03 ENCOUNTER — Other Ambulatory Visit: Payer: Self-pay

## 2018-12-03 ENCOUNTER — Ambulatory Visit (INDEPENDENT_AMBULATORY_CARE_PROVIDER_SITE_OTHER): Payer: Medicare Other | Admitting: Physical Therapy

## 2018-12-03 DIAGNOSIS — M6281 Muscle weakness (generalized): Secondary | ICD-10-CM

## 2018-12-03 DIAGNOSIS — M545 Low back pain, unspecified: Secondary | ICD-10-CM

## 2018-12-03 NOTE — Therapy (Signed)
[place], [place], [address] Phone: [phone]   Fax:  [phone]  Physical Therapy Treatment  Patient Details  Name: Anthony [name]. MRN: [id number] Date of Birth: July 30, 1950 Referring Provider (PT): Dr. Earma Reading   Encounter Date: 12/03/2018  PT End of Session - 12/03/18 1032    Visit Number  5    Number of Visits  12    Date for PT Re-Evaluation  12/30/18    PT Start Time  1033   pt arrived late; heavy traffic   PT Stop Time  1057    PT Time Calculation (min)  24 min    Activity Tolerance  Patient tolerated treatment well    Behavior During Therapy  [facility] for tasks assessed/performed       Past Medical History:  Diagnosis Date  . Anxiety   . Arthritis   . Bipolar disorder ([place])   . CAD (coronary artery disease)    a. INF STEMI 07/04/10:  tx with thrombectomy + [facility] BMS to [facility];  cath 07/04/10: dLM 10-20%, pLAD 40-50%, mLAD 20-30%, pRCA 30%, mRCA occluded and tx with PCI, EF 50% with inf HK. A Multilink  . COPD (chronic obstructive pulmonary disease) ([place])   . Depression   . Glucose intolerance (impaired glucose tolerance)    A1c 6.2 06/2010  . History of DVT (deep vein thrombosis)    traumatic, s/p coumadin tx.  [place] Kitchen HLD (hyperlipidemia)   . Hypertension    pt states is currently on no medications  . Myocardial infarction ([place])    around 2013  . Shortness of breath    walking distance or climbing stairs  . Tobacco abuse   . Urinary frequency   . Vitamin D deficiency 09/05/2018    Past Surgical History:  Procedure Laterality Date  . CARDIAC CATHETERIZATION    . COLONOSCOPY N/A 12/29/2015   Procedure: COLONOSCOPY;  Surgeon: Rogene Houston, MD;  [facility];  Service: Endoscopy;  Laterality: N/A;  12:55  . ELECTROCARDIOGRAM     Showed inferolateral ST-elevation and a code STEMI was activated. In the ER, he was treated with morphine, herarin, and 600 mg of Plavix.  He was transferred emergently to [facility].   . INGUINAL HERNIA REPAIR Left 07/26/2014   Procedure: OPEN REPAIR OF RECURENT LEFT INGUINAL HERNIA REPAIR WITH MESH;  Surgeon: Greer Pickerel, MD;  [facility];  Service: General;  Laterality: Left;  . INSERTION OF MESH Bilateral 10/30/2013   Procedure: INSERTION OF MESH;  Surgeon: Gayland Curry, MD;  [facility];  Service: General;  Laterality: Bilateral;  . LAPAROSCOPIC INGUINAL HERNIA WITH UMBILICAL HERNIA Bilateral 10/30/2013   Procedure: laparoscopic repair left pantaloom hernia with mesh, laparoscopic right inguinal hernia with mesh, OPEN REPAIR OF UMBILICAL HERNIA ;  Surgeon: Gayland Curry, MD;  [facility];  Service: General;  Laterality: Bilateral;  . LAPAROSCOPY N/A 07/26/2014   Procedure: LAPAROSCOPY DIAGNOSTIC;  Surgeon: Greer Pickerel, MD;  [facility];  Service: General;  Laterality: N/A;  . POLYPECTOMY  12/29/2015   Procedure: POLYPECTOMY;  Surgeon: Rogene Houston, MD;  [facility];  Service: Endoscopy;;  ascending colon, descending colon  . stent placement    . TOTAL HIP ARTHROPLASTY Right 05/10/2015   Procedure: RIGHT TOTAL HIP ARTHROPLASTY ANTERIOR APPROACH;  Surgeon: Paralee Cancel, MD;  [facility];  Service: Orthopedics;  Laterality: Right;  [place] Kitchen VASECTOMY  There were no vitals filed for this visit.  Subjective Assessment - 12/03/18 1034    Subjective Pt reports he feels he is doing better; pleased with progress so far.    Currently in Pain?  Yes    Pain Score  1     Pain Location  Back    Pain Orientation  Lower    Pain Descriptors / Indicators  Nagging    Pain Type  Acute pain    Aggravating Factors   end of the day, walking, stairs    Pain Relieving Factors  medicine       [facility] Adult PT Treatment/Exercise - 12/03/18 0001      Lumbar Exercises: Stretches   Passive Hamstring Stretch  Right;Left;2 reps;20 seconds   supine with strap; cues for opp knee bent     Lumbar  Exercises: Aerobic   Nustep  L3 x 23min (arms/legs)       Lumbar Exercises: Standing   Other Standing Lumbar Exercises  anti rotation x 5 reps on each side with red band    cues for form and pacing     Lumbar Exercises: Supine   Ab Set  5 reps;5 seconds    Bridge  10 reps;5 seconds    Straight Leg Raise  10 reps   Rt/Lt cues to maintain knee ext   Other Supine Lumbar Exercises  lumbar rotation 10 reps      Lumbar Exercises: Sidelying   Other Sidelying Lumbar Exercises  open book 10 reps on each side   cues to increase trunk rotation and for form                  PT Long Term Goals - 12/01/18 1023      PT LONG TERM GOAL #1   Title  Patient able to ambulate 2 miles with 2/10 pain or less in BLE.    Baseline  walking 1/4 to 1/2 mile then pain    Status  On-going      PT LONG TERM GOAL #2   Title  Patient to demonstrated improved heel strike with gait and negative Trendelenberg.    Baseline  --    Status  On-going      PT LONG TERM GOAL #3   Title  Ind with HEP    Status  On-going            Plan - 12/03/18 1208    Clinical Impression Statement  Pt tolerated exercises well today with no reports of increased pain. Pt required several cues with open book exercise. Pt continues to make progress toward LTG #1 and 2; will contiue to benefit from skilled PT for improved functional mobility.    Comorbidities  COPD, CHF, MI, spinal stenosis, Rt THA, depression.    Rehab Potential  Good    PT Frequency  2x / week    PT Duration  6 weeks    PT Treatment/Interventions  Electrical Stimulation;Moist Heat;Cryotherapy;Traction;Balance training;Therapeutic exercise;Neuromuscular re-education;Patient/family education;Dry needling;Manual techniques;Taping;Gait training    PT Next Visit Plan  core/lumbar stab; functional gluteus med strengthening, progress gait    PT Home Exercise Plan  [place]       Patient will benefit from skilled therapeutic intervention in order to  improve the following deficits and impairments:  Abnormal gait, Pain, Decreased strength, Postural dysfunction, Impaired flexibility, Decreased activity tolerance  Visit Diagnosis: Acute bilateral low back pain, unspecified whether sciatica present  Muscle weakness (generalized)     Problem List  Patient Active Problem List   Diagnosis Date Noted  . Spinal stenosis of lumbar region with neurogenic claudication 11/14/2018  . Tobacco dependence 10/21/2018  . Vitamin D deficiency 09/05/2018  . Elevated vitamin B12 level 09/05/2018  . Acute neutrophilia 09/05/2018  . Chronic fatigue 08/29/2018  . Generalized weakness 08/29/2018  . Multiple lung nodules 08/03/2018  . Insomnia 03/26/2017  . Aortic atherosclerosis ([place]) 10/19/2016  . Pain medication agreement signed 06/05/2016  . Opioid dependence ([place]) 06/05/2016  . Diabetes mellitus without complication ([place]) [id number]  . Obesity (BMI 30-39.9) 04/13/2016  . Rectal bleeding 11/23/2015  . Moderate episode of recurrent major depressive disorder ([place]) 10/18/2015  . S/P right THA, AA 05/10/2015  . Recurrent left inguinal hernia 07/26/2014  . Chronic right hip pain 06/23/2014  . S/P bilateral inguinal hernia repair 10/30/2013  . Bilateral inguinal hernia 05/28/2013  . Umbilical hernia [id number]  . Major psychotic depression, recurrent ([place]) 12/16/2012  . Inadequate anticoagulation 04/29/2012  . Mood disorder ([place])   . CAD (coronary artery disease)   . Hyperlipidemia associated with type 2 diabetes mellitus ([place])   . COPD (chronic obstructive pulmonary disease) ([place])   . Tobacco abuse     Gardiner Rhyme, SPTA 12/03/2018, 12:15 PM   Kerin Perna, PTA 12/03/18 12:19 PM   [facility] [place], [place], [address] Phone: [phone]   Fax:  [phone]  Name: Anthony [name]. MRN: [id number] Date of Birth: 21-Feb-1950

## 2018-12-08 ENCOUNTER — Encounter: Payer: Medicare Other | Admitting: Physical Therapy

## 2018-12-11 ENCOUNTER — Other Ambulatory Visit: Payer: Self-pay

## 2018-12-11 ENCOUNTER — Ambulatory Visit (INDEPENDENT_AMBULATORY_CARE_PROVIDER_SITE_OTHER): Payer: Medicare Other | Admitting: Physical Therapy

## 2018-12-11 DIAGNOSIS — M6281 Muscle weakness (generalized): Secondary | ICD-10-CM

## 2018-12-11 DIAGNOSIS — M545 Low back pain, unspecified: Secondary | ICD-10-CM

## 2018-12-11 DIAGNOSIS — M79605 Pain in left leg: Secondary | ICD-10-CM

## 2018-12-11 DIAGNOSIS — R29898 Other symptoms and signs involving the musculoskeletal system: Secondary | ICD-10-CM

## 2018-12-11 NOTE — Therapy (Addendum)
[place], [place], [address] Phone: [phone]   Fax:  [phone]  Physical Therapy Treatment  Patient Details  Name: Anthony [name]. MRN: [id number] Date of Birth: 04/16/1950 Referring Provider (PT): Dr. Earma Reading   Encounter Date: 12/11/2018  PT End of Session - 12/11/18 1101    Visit Number  6    Number of Visits  12    Date for PT Re-Evaluation  12/30/18    PT Start Time  1056    PT Stop Time  1140    PT Time Calculation (min)  44 min    Activity Tolerance  Patient tolerated treatment well    Behavior During Therapy  [facility] for tasks assessed/performed       Past Medical History:  Diagnosis Date  . Anxiety   . Arthritis   . Bipolar disorder ([place])   . CAD (coronary artery disease)    a. INF STEMI 07/04/10:  tx with thrombectomy + [facility] to [facility];  cath 07/04/10: dLM 10-20%, pLAD 40-50%, mLAD 20-30%, pRCA 30%, mRCA occluded and tx with PCI, EF 50% with inf HK. A Multilink  . COPD (chronic obstructive pulmonary disease) ([place])   . Depression   . Glucose intolerance (impaired glucose tolerance)    A1c 6.2 06/2010  . History of DVT (deep vein thrombosis)    traumatic, s/p coumadin tx.  Marland Kitchen HLD (hyperlipidemia)   . Hypertension    pt states is currently on no medications  . Myocardial infarction ([place])    around 2013  . Shortness of breath    walking distance or climbing stairs  . Tobacco abuse   . Urinary frequency   . Vitamin D deficiency 09/05/2018    Past Surgical History:  Procedure Laterality Date  . CARDIAC CATHETERIZATION    . COLONOSCOPY N/A 12/29/2015   Procedure: COLONOSCOPY;  Surgeon: Rogene Houston, MD;  [facility];  Service: Endoscopy;  Laterality: N/A;  12:55  . ELECTROCARDIOGRAM     Showed inferolateral ST-elevation and a code STEMI was activated. In the ER, he was treated with morphine, herarin, and 600 mg of Plavix. He was transferred emergently to  [facility].   . INGUINAL HERNIA REPAIR Left 07/26/2014   Procedure: OPEN REPAIR OF RECURENT LEFT INGUINAL HERNIA REPAIR WITH MESH;  Surgeon: Greer Pickerel, MD;  [facility];  Service: General;  Laterality: Left;  . INSERTION OF MESH Bilateral 10/30/2013   Procedure: INSERTION OF MESH;  Surgeon: Gayland Curry, MD;  [facility];  Service: General;  Laterality: Bilateral;  . LAPAROSCOPIC INGUINAL HERNIA WITH UMBILICAL HERNIA Bilateral 10/30/2013   Procedure: laparoscopic repair left pantaloom hernia with mesh, laparoscopic right inguinal hernia with mesh, OPEN REPAIR OF UMBILICAL HERNIA ;  Surgeon: Gayland Curry, MD;  [facility];  Service: General;  Laterality: Bilateral;  . LAPAROSCOPY N/A 07/26/2014   Procedure: LAPAROSCOPY DIAGNOSTIC;  Surgeon: Greer Pickerel, MD;  [facility];  Service: General;  Laterality: N/A;  . POLYPECTOMY  12/29/2015   Procedure: POLYPECTOMY;  Surgeon: Rogene Houston, MD;  [facility];  Service: Endoscopy;;  ascending colon, descending colon  . stent placement    . TOTAL HIP ARTHROPLASTY Right 05/10/2015   Procedure: RIGHT TOTAL HIP ARTHROPLASTY ANTERIOR APPROACH;  Surgeon: Paralee Cancel, MD;  [facility];  Service: Orthopedics;  Laterality: Right;  [place] Kitchen VASECTOMY      There were no vitals filed for  this visit.  Subjective Assessment - 12/11/18 1101    Subjective  Patient reports he has been having some days where he awakes with more pain than usual and then worsens during the day. Yesterday was okay but the two days before were bad.    Patient Stated Goals  get rid of leg pain and back pain    Currently in Pain?  Yes    Pain Score  1     Pain Location  Back                       [facility] Adult PT Treatment/Exercise - 12/11/18 0001      Exercises   Exercises  Lumbar;Knee/Hip      Lumbar Exercises: Stretches   Gastroc Stretch  Right;Left;2 reps;30 seconds    Gastroc Stretch Limitations  on level 2 incline       Lumbar Exercises: Aerobic   Tread Mill  1.0 mph x 1 min; 1.2 mph x 30 sec   cues for toe raise   Nustep  L5 x 5 min arms and legs      Lumbar Exercises: Standing   Other Standing Lumbar Exercises  anti rotation 2 x 5 reps on each side with red band    cues for form and pacing     Knee/Hip Exercises: Machines for Strengthening   Cybex Knee Extension  2 plates 2x10 slowly lowering      Knee/Hip Exercises: Standing   Heel Raises  Both;15 reps    Lateral Step Up  15 reps;Right;Left;Hand Hold: 2    Lateral Step Up Limitations  with hip ABD on bosu    Forward Step Up  Both;1 set;10 reps;Hand Hold: 1    Forward Step Up Limitations  with opp knee raise     Other Standing Knee Exercises  heel/toe walking x 2 laps on line    Other Standing Knee Exercises  two laps around gym without requiring VCs for heel strike      Knee/Hip Exercises: Seated   Sit to Sand  10 reps                  PT Long Term Goals - 12/11/18 1106      PT LONG TERM GOAL #1   Title  Patient able to ambulate 2 miles with 2/10 pain or less in BLE.    Baseline  walking less than  1/4 to 1/2 mile then pain    Status  On-going      PT LONG TERM GOAL #2   Title  Patient to demonstrated improved heel strike with gait and negative Trendelenberg.    Baseline  good heel strike without cueing    Status  Partially Met      PT LONG TERM GOAL #3   Title  Ind with HEP    Status  On-going            Plan - 12/11/18 1251    Clinical Impression Statement  Patient tolerated new exercises well today. He fatigued with sit to stand and was encouraged to include in HEP. TM was difficult for pt and he was not comfortable with it after one minute. Patient's pain seems to be inconsistent day to day, but overall is not improving with ambulation.    Comorbidities  COPD, CHF, MI, spinal stenosis, Rt THA, depression.    PT Treatment/Interventions  Electrical Stimulation;Moist Heat;Cryotherapy;Traction;Balance  training;Therapeutic exercise;Neuromuscular re-education;Patient/family education;Dry needling;Manual techniques;Taping;Gait training  PT Next Visit Plan  core/lumbar stab; functional gluteus med strengthening, progress gait    PT Home Exercise Plan  [place]       Patient will benefit from skilled therapeutic intervention in order to improve the following deficits and impairments:  Abnormal gait, Pain, Decreased strength, Postural dysfunction, Impaired flexibility, Decreased activity tolerance  Visit Diagnosis: Acute bilateral low back pain, unspecified whether sciatica present  Muscle weakness (generalized)  Other symptoms and signs involving the musculoskeletal system  Pain in left leg     Problem List Patient Active Problem List   Diagnosis Date Noted  . Spinal stenosis of lumbar region with neurogenic claudication 11/14/2018  . Tobacco dependence 10/21/2018  . Vitamin D deficiency 09/05/2018  . Elevated vitamin B12 level 09/05/2018  . Acute neutrophilia 09/05/2018  . Chronic fatigue 08/29/2018  . Generalized weakness 08/29/2018  . Multiple lung nodules 08/03/2018  . Insomnia 03/26/2017  . Aortic atherosclerosis ([place]) 10/19/2016  . Pain medication agreement signed 06/05/2016  . Opioid dependence ([place]) 06/05/2016  . Diabetes mellitus without complication ([place]) 81/66/1969  . Obesity (BMI 30-39.9) 04/13/2016  . Rectal bleeding 11/23/2015  . Moderate episode of recurrent major depressive disorder ([place]) 10/18/2015  . S/P right THA, AA 05/10/2015  . Recurrent left inguinal hernia 07/26/2014  . Chronic right hip pain 06/23/2014  . S/P bilateral inguinal hernia repair 10/30/2013  . Bilateral inguinal hernia 05/28/2013  . Umbilical hernia 40/98/2867  . Major psychotic depression, recurrent ([place]) 12/16/2012  . Inadequate anticoagulation 04/29/2012  . Mood disorder ([place])   . CAD (coronary artery disease)   . Hyperlipidemia associated with type 2 diabetes mellitus ([place])    . COPD (chronic obstructive pulmonary disease) ([place])   . Tobacco abuse     Madelyn Flavors PT 12/11/2018, 12:57 PM  [facility] [place], [place], [address] Phone: [phone]   Fax:  [phone]  Name: Meir [place]. MRN: [id number] Date of Birth: 1950/10/18   PHYSICAL THERAPY DISCHARGE SUMMARY  Visits from Start of Care: 6  Current functional level related to goals / functional outcomes: See above   Remaining deficits: See above   Education / Equipment: HEP Plan: Patient agrees to discharge.  Patient goals were partially met. Patient is being discharged due to not returning since the last visit.  ?????     Madelyn Flavors, PT 01/06/19 3:12 PM  [facility] [place], [place] [address]  [phone] (office) [phone] (fax)

## 2018-12-12 ENCOUNTER — Other Ambulatory Visit: Payer: Self-pay

## 2018-12-12 NOTE — Patient Outreach (Signed)
[facility]) Care Management  12/12/2018  Jasaiah Scaccia 1950-07-07 [id number]   Medication Adherence call to Mr. Dereion Jarquin Hippa Identifiers Verify spoke with patient he is past due on Glipizide 10 mg and Lisinopril 5 mg,patient explain he takes 1 tablet daily on both medication,he explain he has plenty until the end of the year.Mr. Sha is showing past due under [place].   [place] Management Direct Dial [phone]  Fax [phone] [email or web address]

## 2018-12-17 ENCOUNTER — Telehealth: Payer: Self-pay

## 2018-12-17 MED ORDER — TEMAZEPAM 15 MG PO CAPS: 15.0000 mg | ORAL_CAPSULE | Freq: Every evening | ORAL | 2 refills | Status: DC | PRN

## 2018-12-17 NOTE — Telephone Encounter (Signed)
Pt called requesting med refill for temazepam. Pls send to [place].

## 2018-12-22 ENCOUNTER — Encounter: Payer: Self-pay | Admitting: Osteopathic Medicine

## 2018-12-22 ENCOUNTER — Other Ambulatory Visit: Payer: Self-pay

## 2018-12-22 ENCOUNTER — Ambulatory Visit (INDEPENDENT_AMBULATORY_CARE_PROVIDER_SITE_OTHER): Payer: Medicare Other

## 2018-12-22 ENCOUNTER — Ambulatory Visit (INDEPENDENT_AMBULATORY_CARE_PROVIDER_SITE_OTHER): Payer: Medicare Other | Admitting: Osteopathic Medicine

## 2018-12-22 VITALS — BP 109/72 | HR 54 | Temp 97.5°F | Wt 211.0 lb

## 2018-12-22 DIAGNOSIS — I10 Essential (primary) hypertension: Secondary | ICD-10-CM | POA: Diagnosis not present

## 2018-12-22 DIAGNOSIS — E119 Type 2 diabetes mellitus without complications: Secondary | ICD-10-CM

## 2018-12-22 DIAGNOSIS — M48062 Spinal stenosis, lumbar region with neurogenic claudication: Secondary | ICD-10-CM | POA: Diagnosis not present

## 2018-12-22 DIAGNOSIS — M545 Low back pain: Secondary | ICD-10-CM | POA: Diagnosis not present

## 2018-12-22 LAB — POCT GLYCOSYLATED HEMOGLOBIN (HGB A1C): Hemoglobin A1C: 8.6 % — AB (ref 4.0–5.6)

## 2018-12-22 MED ORDER — GLIPIZIDE 10 MG PO TABS: 10.0000 mg | ORAL_TABLET | Freq: Two times a day (BID) | ORAL | 3 refills | Status: DC

## 2018-12-22 MED ORDER — LISINOPRIL 5 MG PO TABS: 5.0000 mg | ORAL_TABLET | Freq: Every day | ORAL | 3 refills | Status: DC

## 2018-12-22 MED ORDER — ATORVASTATIN CALCIUM 40 MG PO TABS: 40.0000 mg | ORAL_TABLET | Freq: Every day | ORAL | 3 refills | Status: AC

## 2018-12-22 NOTE — Progress Notes (Signed)
HPI: Anthony [name]. is a 68 y.o. male who  has a past medical history of Anxiety, Arthritis, Bipolar disorder ([place]), CAD (coronary artery disease), COPD (chronic obstructive pulmonary disease) ([place]), Depression, Glucose intolerance (impaired glucose tolerance), History of DVT (deep vein thrombosis), HLD (hyperlipidemia), Hypertension, Myocardial infarction [facility]), Shortness of breath, Tobacco abuse, Urinary frequency, and Vitamin D deficiency (09/05/2018).  he presents to [facility] today, 12/22/18,  for chief complaint of:  MSK pain DM2 check-up  Reprots continued lower back pain and leg pain - following with Dr Darene Lamer, I spoke w/ this provider re: follow-up, pt ok to get MRI, see A/P.   DM2 not quite at goal, pt reports room for improvement in diet/exercise. We have discussed other meds in the past but cost has always been an issue.   HTN: well controlled BP Readings from Last 3 Encounters:  12/22/18 109/72  11/28/18 116/69  11/14/18 108/69      At today's visit 12/22/18 ... PMH, PSH, FH reviewed and updated as needed.  Current medication list and allergy/intolerance hx reviewed and updated as needed. (See remainder of HPI, ROS, Phys Exam below)   No results found.  Results for orders placed or performed in visit on 12/22/18 (from the past 72 hour(s))  POCT HgB A1C     Status: Abnormal   Collection Time: 12/22/18 10:52 AM  Result Value Ref Range   Hemoglobin A1C 8.6 (A) 4.0 - 5.6 %   HbA1c POC (<> result, manual entry)     HbA1c, POC (prediabetic range)     HbA1c, POC (controlled diabetic range)            ASSESSMENT/PLAN: The primary encounter diagnosis was Diabetes mellitus without complication ([place]). Diagnoses of Spinal stenosis of lumbar region with neurogenic claudication and Essential hypertension were also pertinent to this visit.   Orders Placed This Encounter  Procedures  . POCT HgB A1C     Meds ordered this encounter   Medications  . lisinopril (ZESTRIL) 5 MG tablet    Sig: Take 1 tablet (5 mg total) by mouth daily.    Dispense:  90 tablet    Refill:  3    Requesting 1 year supply  . glipiZIDE (GLUCOTROL) 10 MG tablet    Sig: Take 1 tablet (10 mg total) by mouth 2 (two) times daily before a meal.    Dispense:  180 tablet    Refill:  3    Requesting 1 year supply  . atorvastatin (LIPITOR) 40 MG tablet    Sig: Take 1 tablet (40 mg total) by mouth daily.    Dispense:  90 tablet    Refill:  3    Requesting 1 year supply        Follow-up plan: Return in about 3 months (around 03/22/2019) for A1C / diabetes recheck (can cancel other upcoming apts w/ Dr A), as directed w/ Dr T .                                                 ################################################# ################################################# ################################################# #################################################    Current Meds  Medication Sig  . albuterol (PROVENTIL HFA;VENTOLIN HFA) 108 (90 Base) MCG/ACT inhaler Inhale 2 puffs into the lungs every 4 (four) hours as needed for wheezing or shortness of breath (AS NEEDED - RESCUE INHALER).  Marland Kitchen  aspirin EC 81 MG tablet Take 1 tablet (81 mg total) by mouth daily.  Marland Kitchen atorvastatin (LIPITOR) 40 MG tablet TAKE 1 TABLET BY MOUTH  DAILY  . baclofen (LIORESAL) 10 MG tablet Take 1 tablet (10 mg total) by mouth 2 (two) times daily.  . blood glucose meter kit and supplies KIT Dispense based on patient and insurance preference. Use to check BG once daily or every other other.  Dx: E11.9  . diclofenac (VOLTAREN) 75 MG EC tablet Take 1 tablet (75 mg total) by mouth 2 (two) times daily.  . ergocalciferol (VITAMIN D2) 1.25 MG (50000 UT) capsule Tale 1 capsule by mouth weekly for 4 weeks, then 1 capsule every 30 days for maintenance  . fluticasone (FLONASE) 50 MCG/ACT nasal spray Place 2 sprays into both  nostrils daily.  Marland Kitchen gabapentin (NEURONTIN) 800 MG tablet Take 1 tablet (800 mg total) by mouth 3 (three) times daily.  Marland Kitchen glipiZIDE (GLUCOTROL) 10 MG tablet TAKE 1 TABLET BY MOUTH  TWICE DAILY BEFORE A MEAL  . ipratropium-albuterol (DUONEB) 0.5-2.5 (3) MG/3ML SOLN Take 3 mLs by nebulization every 4 (four) hours as needed (wheeze, cough, short of breath. Max use 6 vials per day).  Marland Kitchen lisinopril (ZESTRIL) 5 MG tablet TAKE 1 TABLET BY MOUTH  DAILY  . metFORMIN (GLUCOPHAGE-XR) 500 MG 24 hr tablet Take 2 tablets (1,000 mg total) by mouth daily with breakfast. Short term until mail order arrives  . metoprolol succinate (TOPROL-XL) 25 MG 24 hr tablet Take 0.5 tablets (12.5 mg total) by mouth daily.  . mirtazapine (REMERON) 30 MG tablet Take 0.5 tablets (15 mg total) by mouth at bedtime.  . Nebulizers (NEBULIZER COMPRESSOR) MISC Per insurance coverage and patient preference. Dx COPD  . ramelteon (ROZEREM) 8 MG tablet Take 1 tablet (8 mg total) by mouth at bedtime.  Marland Kitchen Respiratory Therapy Supplies (NEBULIZER/TUBING/MOUTHPIECE) KIT Per insurance coverage and patient preference. Dx COPD  . sennosides-docusate sodium (SENOKOT-S) 8.6-50 MG tablet Take 1 tablet by mouth 3 (three) times daily as needed for constipation.   . sildenafil (VIAGRA) 100 MG tablet Take 0.5-1 tablets (50-100 mg total) by mouth daily as needed for erectile dysfunction.  . temazepam (RESTORIL) 15 MG capsule Take 1 capsule (15 mg total) by mouth at bedtime as needed for sleep. #30 for (30) days  . Tiotropium Bromide Monohydrate (SPIRIVA RESPIMAT) 2.5 MCG/ACT AERS Inhale 2 puffs into the lungs daily.    Allergies  Allergen Reactions  . Flomax [Tamsulosin Hcl] Other (See Comments)    This medication makes patient feel sick  . Penicillins Other (See Comments)    Reaction unknown occurred during childhood Has patient had a PCN reaction causing immediate rash, facial/tongue/throat swelling, SOB or lightheadedness with hypotension: unsure -  childhood reaction Has patient had a PCN reaction causing severe rash involving mucus membranes or skin necrosis: unsure - childhood reaction Has patient had a PCN reaction that required hospitalization no Has patient had a PCN reaction occurring within the last 10 years: no If all of the above answers are "NO", then may p       Review of Systems:  Cardiac: No  chest pain, No  pressure, No palpitations  Respiratory:  No  shortness of breath. No  Cough  Musculoskeletal: No new myalgia/arthralgia  Neurologic: No  weakness, No  Dizziness  Psychiatric: No  concerns with depression, No  concerns with anxiety  Exam:  BP 109/72 (BP Location: Left Arm, Patient Position: Sitting, Cuff Size: Normal)   Pulse Marland Kitchen)  54   Temp (!) 97.5 F (36.4 C) (Oral)   Wt 211 lb 0.6 oz (95.7 kg)   SpO2 91%   BMI 30.72 kg/m   Constitutional: VS see above. General Appearance: alert, well-developed, well-nourished, NAD  Eyes: Normal lids and conjunctive, non-icteric sclera  Neck: No masses, trachea midline.   Respiratory: Normal respiratory effort. no wheeze, no rhonchi, no rales  Cardiovascular: S1/S2 normal, no murmur, no rub/gallop auscultated. RRR.   Neurological: Normal balance/coordination. No tremor.  Skin: warm, dry, intact.   Psychiatric: Normal judgment/insight. Normal mood and affect. Oriented x3.       Visit summary with medication list and pertinent instructions was printed for patient to review, patient was advised to alert [place] if any updates are needed. All questions at time of visit were answered - patient instructed to contact office with any additional concerns. ER/RTC precautions were reviewed with the patient and understanding verbalized.   Note: Total time spent 25 minutes, greater than 50% of the visit was spent face-to-face counseling and coordinating care for the following: The primary encounter diagnosis was Diabetes mellitus without complication ([place]). Diagnoses of  Spinal stenosis of lumbar region with neurogenic claudication and Essential hypertension were also pertinent to this visit.Marland Kitchen  Please note: voice recognition software was used to produce this document, and typos may escape review. Please contact Dr. Sheppard Coil for any needed clarifications.    Follow up plan: Return in about 3 months (around 03/22/2019) for A1C / diabetes recheck (can cancel other upcoming apts w/ Dr A), as directed w/ Dr T .

## 2018-12-23 ENCOUNTER — Telehealth: Payer: Self-pay | Admitting: Sports Medicine

## 2018-12-23 DIAGNOSIS — M4807 Spinal stenosis, lumbosacral region: Secondary | ICD-10-CM

## 2018-12-23 DIAGNOSIS — M48062 Spinal stenosis, lumbar region with neurogenic claudication: Secondary | ICD-10-CM

## 2018-12-23 NOTE — Telephone Encounter (Signed)
-----   Message from Narda Rutherford, [place] sent at 12/23/2018 11:09 AM EST ----- Patient advised of results and recommendations. He has agreed to have the MRI. I don't see the order in the chart.

## 2018-12-23 NOTE — Assessment & Plan Note (Signed)
Low back pain that is better with flexion, symptoms now present for greater than 6 weeks in spite of conservative measures. Cramping in both legs with walking consistent with neurogenic claudication. In the past CT scan has showed moderate L4-L5 central canal stenosis, he also has ABIs that showed no evidence of vascular claudication. Gabapentin 800 mg 3 times daily has not provided good relief nor has the addition of diclofenac. He has failed greater than 6-week of formal physical therapy. Proceeding with MRI for interventional planning.

## 2018-12-26 ENCOUNTER — Ambulatory Visit: Payer: Medicare Other | Admitting: Sports Medicine

## 2018-12-28 ENCOUNTER — Ambulatory Visit (INDEPENDENT_AMBULATORY_CARE_PROVIDER_SITE_OTHER): Payer: Medicare Other

## 2018-12-28 ENCOUNTER — Other Ambulatory Visit: Payer: Self-pay

## 2018-12-28 DIAGNOSIS — M48062 Spinal stenosis, lumbar region with neurogenic claudication: Secondary | ICD-10-CM

## 2018-12-28 DIAGNOSIS — M4807 Spinal stenosis, lumbosacral region: Secondary | ICD-10-CM

## 2018-12-28 DIAGNOSIS — M545 Low back pain: Secondary | ICD-10-CM | POA: Diagnosis not present

## 2018-12-29 ENCOUNTER — Ambulatory Visit: Payer: Medicare Other | Admitting: Osteopathic Medicine

## 2018-12-29 ENCOUNTER — Telehealth: Payer: Self-pay

## 2018-12-29 DIAGNOSIS — F5101 Primary insomnia: Secondary | ICD-10-CM

## 2018-12-29 MED ORDER — RAMELTEON 8 MG PO TABS: 8.0000 mg | ORAL_TABLET | Freq: Every day | ORAL | 2 refills | Status: DC

## 2018-12-29 NOTE — Telephone Encounter (Signed)
Pt called requesting med refill for ramelteon.

## 2019-01-07 ENCOUNTER — Telehealth: Payer: Self-pay

## 2019-01-07 NOTE — Telephone Encounter (Signed)
Pt called requesting med refill for trazadone 100 mg. Rx not listed in active med list. Pt is requesting rx be sent to [place]. Pls advise, thanks.

## 2019-01-07 NOTE — Telephone Encounter (Signed)
I think his psychiatrist is prescribing this, I'd have him contact his pharmacy and have then send request

## 2019-01-08 MED ORDER — TRAZODONE HCL 100 MG PO TABS: 100.0000 mg | ORAL_TABLET | Freq: Every day | ORAL | 1 refills | Status: DC

## 2019-01-08 NOTE — Telephone Encounter (Signed)
Patient doesn't have a psychiatrist. He was prescribed Trazodone when he was inpatient at [facility]. He does have Rozerem on his medication list. He states he was unable to afford the Rozerem. He also takes Temazepam at bedtime. He reports the Temazepam is not working as it has in the past. He is unable to sleep and he believes the Trazodone 100 mg was helping. Please advise.

## 2019-01-08 NOTE — Telephone Encounter (Signed)
Medication sent. Patient aware. Patient scheduled. Updated medication list.

## 2019-01-08 NOTE — Telephone Encounter (Signed)
Stop temazepam. Please take both off med list. Ok to send trazodone 100mg  qhs #30 1 refill and follow up with PCP.

## 2019-01-09 NOTE — Telephone Encounter (Signed)
He was seeing someone at the [facility] I thought?  Records not available in Epic Can follow as needed, ok to stay on the trazodone and will address this more at visit

## 2019-01-15 ENCOUNTER — Other Ambulatory Visit: Payer: Self-pay

## 2019-01-15 DIAGNOSIS — M791 Myalgia, unspecified site: Secondary | ICD-10-CM

## 2019-01-15 MED ORDER — BACLOFEN 10 MG PO TABS: 10.0000 mg | ORAL_TABLET | Freq: Two times a day (BID) | ORAL | 0 refills | Status: DC

## 2019-01-17 IMAGING — DX DG LUMBAR SPINE COMPLETE 4+V
5 series · 5 of 5 positions shown · non-contrast
Comparison: 04/16/2014 CT.

CLINICAL DATA: 66-year-old male with lower back pain extending down
left leg for 1-2 months. No injury. Initial encounter.

EXAM:
LUMBAR SPINE - COMPLETE 4+ VIEW

[l-spine ap]
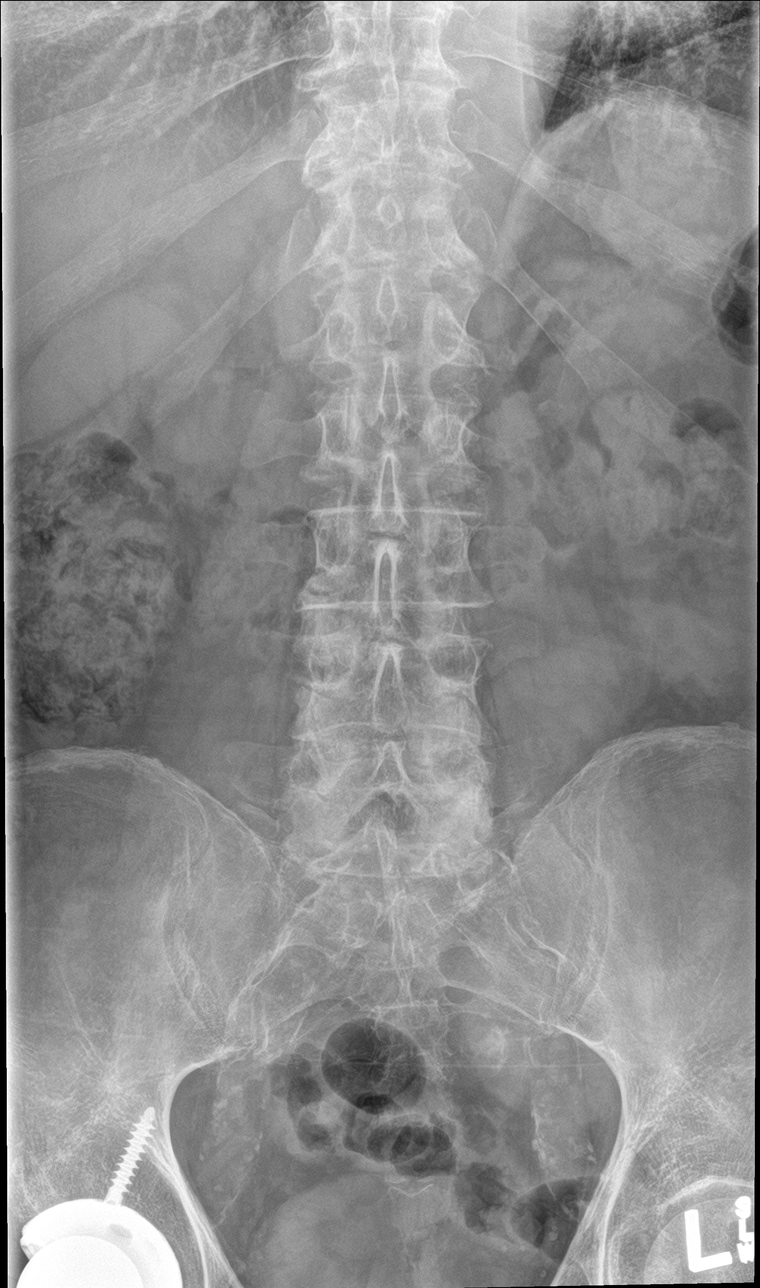

[l-spine obl (1 of 2)]
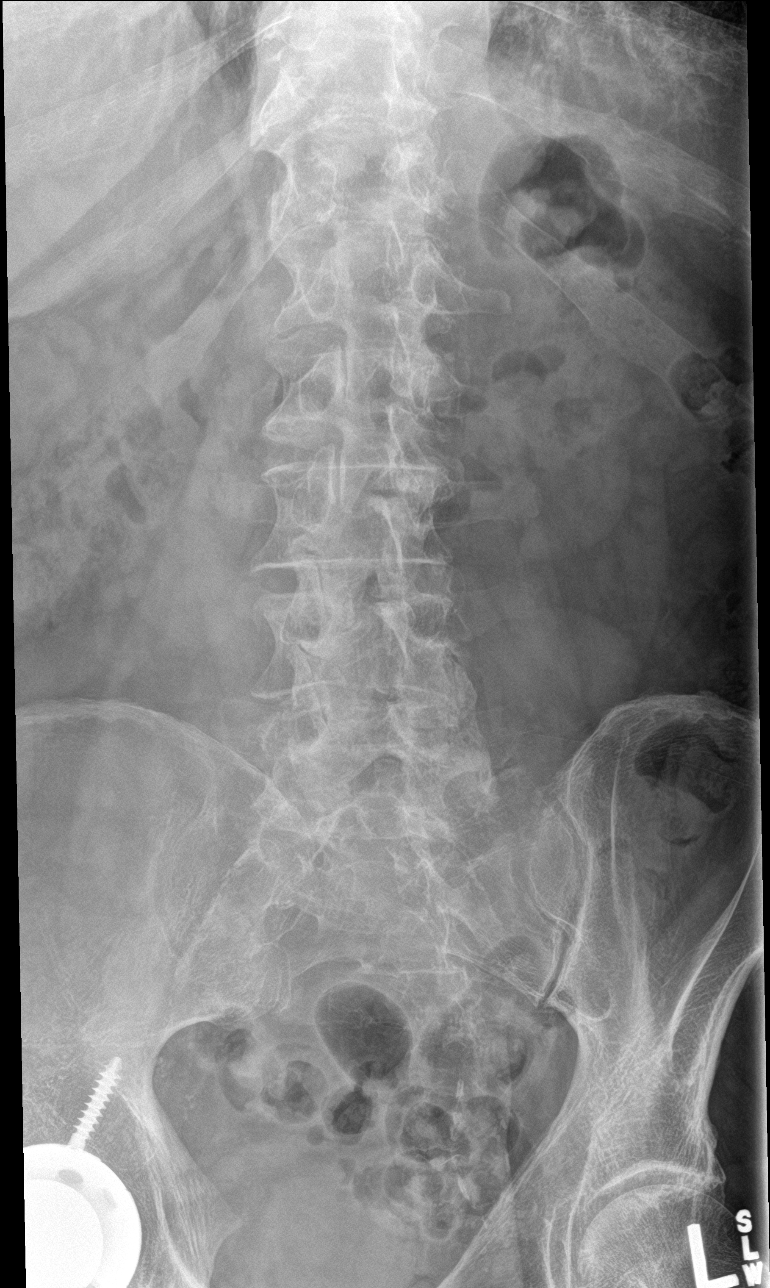

[l-spine obl (2 of 2)]
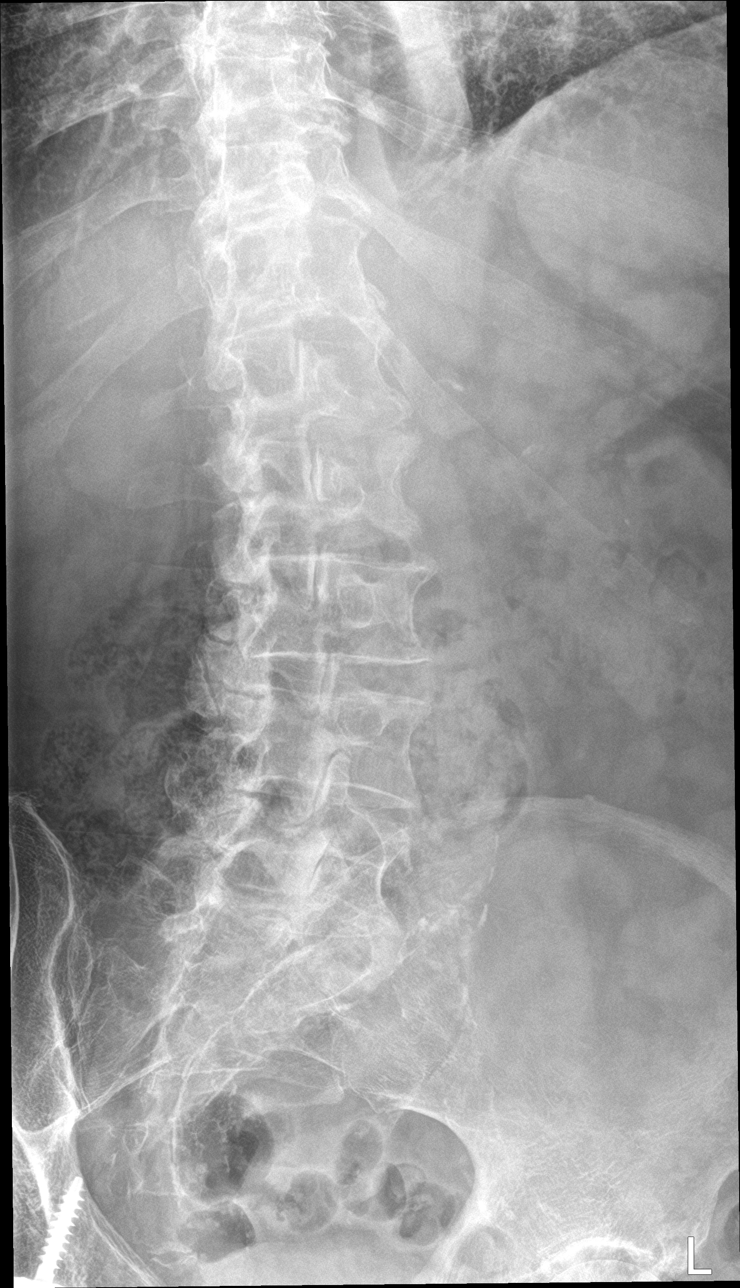

[l-spine lat]
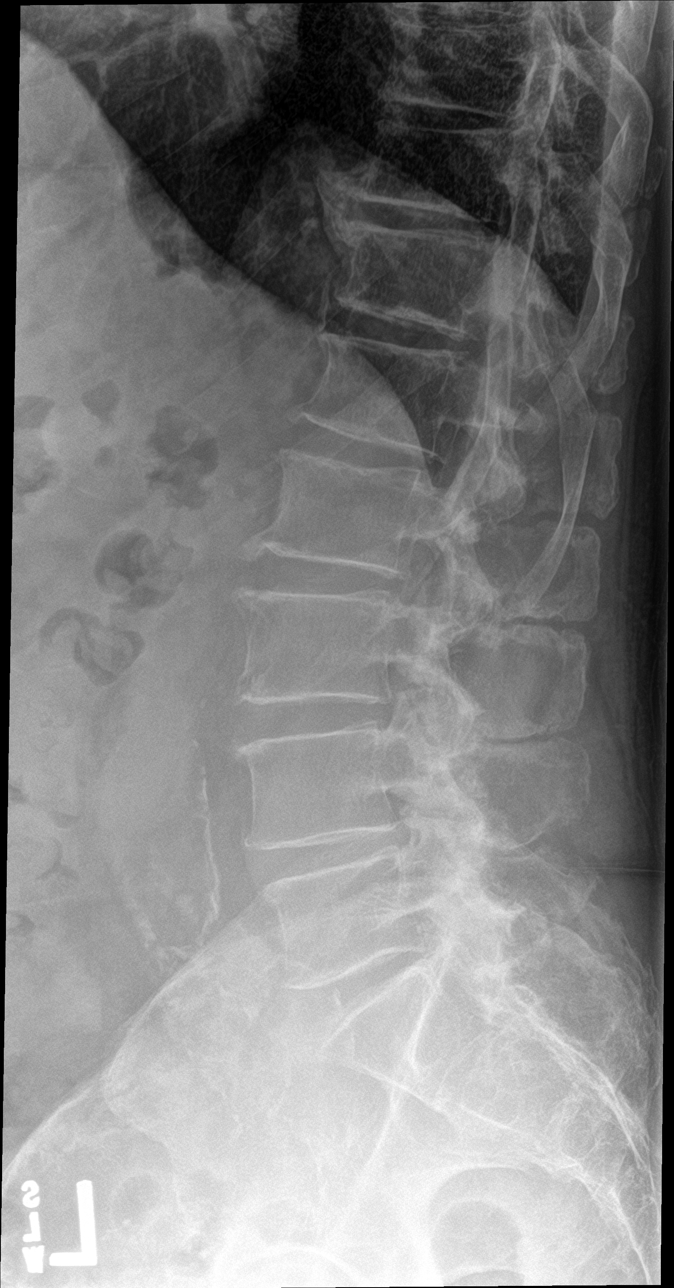

[l-spine spot]
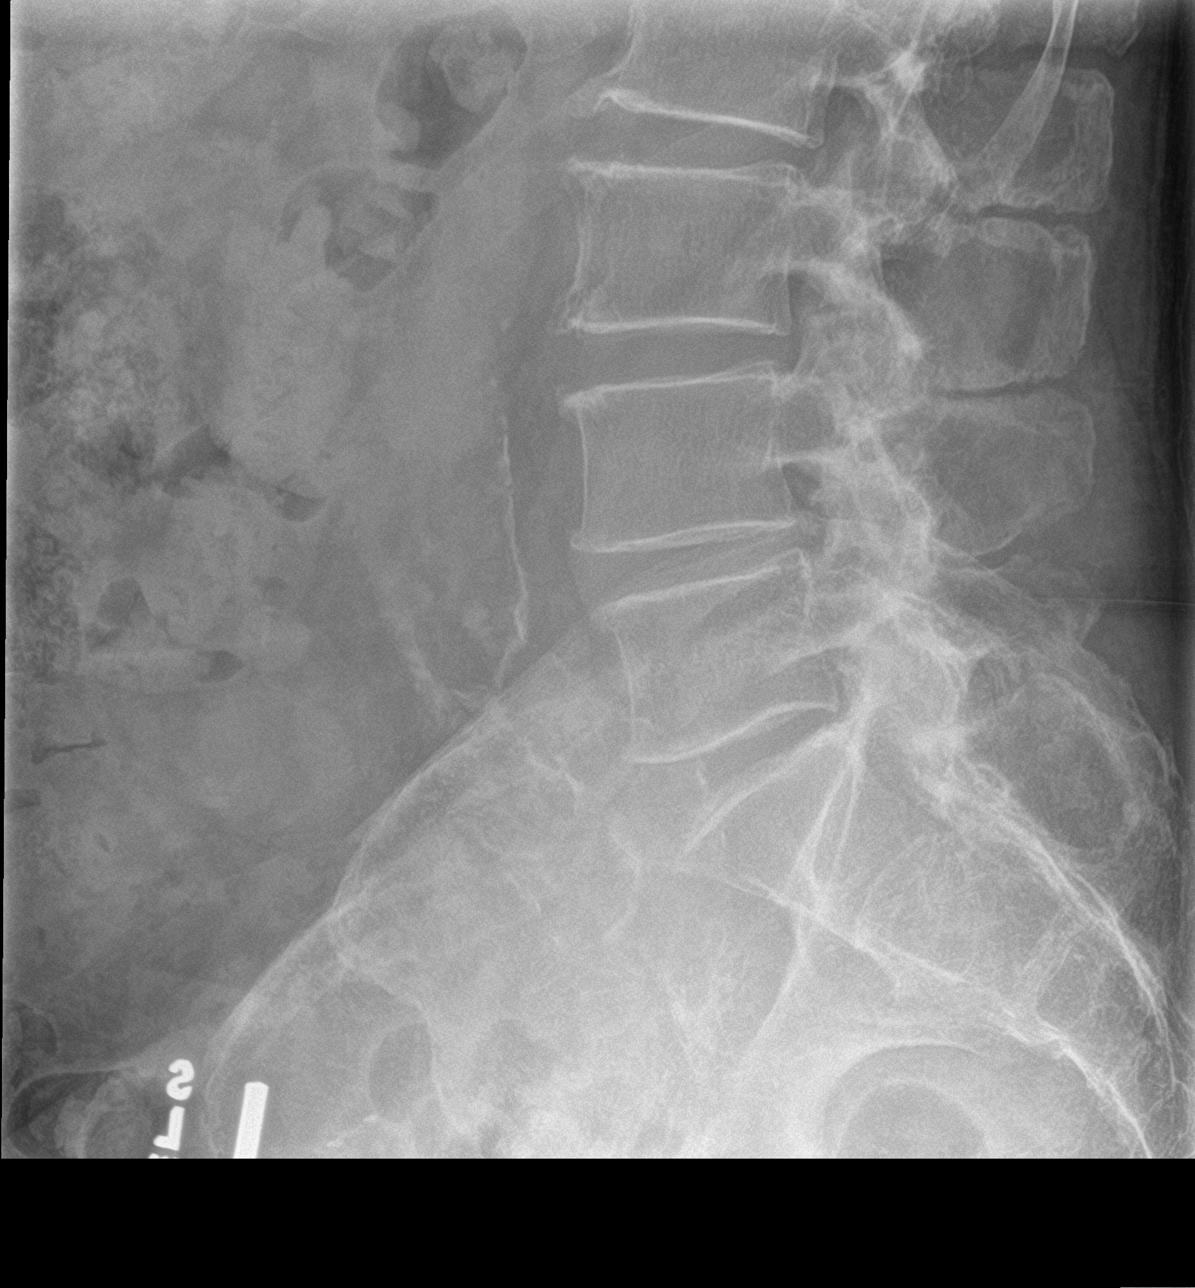

[5 of 5 positions shown; findings below may reference images not displayed]

FINDINGS: Normal alignment lumbar spine. No compression fracture or pars
defect. Mild L4-5 and L5-S1 disc space narrowing. Facet degenerative
changes L3-4 through L5-S1.

Prominent anterior osteophyte T11-12. Small anterior osteophyte L1-2
and L2-3. Prior right hip replacement. Vascular calcifications.
Caliber of aorta incompletely assessed but may be dilated.
Ultrasound can be obtained for further delineation.
IMPRESSION: 1. Mild L4-5 and L5-S1 disc space narrowing.
2. Facet degenerative changes L3-4 through L5-S1.
3. Aortic Atherosclerosis ([id number]). Caliber of aorta
incompletely assessed but may be dilated. Abdominal aortic
ultrasound recommended for further delineation.

## 2019-01-30 ENCOUNTER — Other Ambulatory Visit: Payer: Self-pay

## 2019-01-30 ENCOUNTER — Emergency Department (INDEPENDENT_AMBULATORY_CARE_PROVIDER_SITE_OTHER)
Admission: EM | Admit: 2019-01-30 | Discharge: 2019-01-30 | Disposition: A | Discharge status: Home / Self Care | Payer: Medicare Other | Source: Home / Self Care

## 2019-01-30 ENCOUNTER — Emergency Department (INDEPENDENT_AMBULATORY_CARE_PROVIDER_SITE_OTHER): Payer: Medicare Other

## 2019-01-30 DIAGNOSIS — S2232XA Fracture of one rib, left side, initial encounter for closed fracture: Secondary | ICD-10-CM | POA: Diagnosis not present

## 2019-01-30 DIAGNOSIS — J849 Interstitial pulmonary disease, unspecified: Secondary | ICD-10-CM | POA: Diagnosis not present

## 2019-01-30 DIAGNOSIS — S299XXA Unspecified injury of thorax, initial encounter: Secondary | ICD-10-CM | POA: Diagnosis not present

## 2019-01-30 MED ORDER — TRAMADOL HCL 50 MG PO TABS: 50.0000 mg | ORAL_TABLET | Freq: Four times a day (QID) | ORAL | 0 refills | Status: DC | PRN

## 2019-01-30 NOTE — ED Provider Notes (Signed)
Anthony [name] CARE    CSN: [id number] Arrival date & time: 01/30/19  1428      History   Chief Complaint Chief Complaint  Patient presents with   Chest Pain    ribs on left side from fall    HPI Anthony [name]. is a 69 y.o. male.   Pt reports he fell on Tuesday.  Pt struck his chest on a landscape timber.  Pt complains of pain to left lower chest.  Pt has pain with breathing   The history is provided by the patient. No language interpreter was used.  Fall This is a new problem. The current episode started more than 2 days ago. The problem occurs constantly. The problem has been gradually worsening. Nothing aggravates the symptoms. Nothing relieves the symptoms. He has tried nothing for the symptoms. The treatment provided no relief.    Past Medical History:  Diagnosis Date   Anxiety    Arthritis    Bipolar disorder ([place])    CAD (coronary artery disease)    a. INF STEMI 07/04/10:  tx with thrombectomy + [facility] BMS to [facility];  cath 07/04/10: dLM 10-20%, pLAD 40-50%, mLAD 20-30%, pRCA 30%, mRCA occluded and tx with PCI, EF 50% with inf HK. A Multilink   COPD (chronic obstructive pulmonary disease) (HCC)    Depression    Glucose intolerance (impaired glucose tolerance)    A1c 6.2 06/2010   History of DVT (deep vein thrombosis)    traumatic, s/p coumadin tx.   HLD (hyperlipidemia)    Hypertension    pt states is currently on no medications   Myocardial infarction ([place])    around 2013   Shortness of breath    walking distance or climbing stairs   Tobacco abuse    Urinary frequency    Vitamin D deficiency 09/05/2018    Patient Active Problem List   Diagnosis Date Noted   Spinal stenosis of lumbar region with neurogenic claudication 11/14/2018   Tobacco dependence 10/21/2018   Vitamin D deficiency 09/05/2018   Elevated vitamin B12 level 09/05/2018   Acute neutrophilia 09/05/2018   Chronic fatigue 08/29/2018   Generalized weakness 08/29/2018    Multiple lung nodules 08/03/2018   Insomnia 03/26/2017   Aortic atherosclerosis ([place]) 10/19/2016   Pain medication agreement signed 06/05/2016   Opioid dependence ([place]) 06/05/2016   Diabetes mellitus without complication ([place]) 82/42/3536   Obesity (BMI 30-39.9) 04/13/2016   Rectal bleeding 11/23/2015   Moderate episode of recurrent major depressive disorder ([place]) 10/18/2015   S/P right THA, AA 05/10/2015   Recurrent left inguinal hernia 07/26/2014   Chronic right hip pain 06/23/2014   S/P bilateral inguinal hernia repair 10/30/2013   Bilateral inguinal hernia 14/43/1540   Umbilical hernia 08/67/6195   Major psychotic depression, recurrent ([place]) 12/16/2012   Inadequate anticoagulation 04/29/2012   Mood disorder ([place])    CAD (coronary artery disease)    Hyperlipidemia associated with type 2 diabetes mellitus ([place])    COPD (chronic obstructive pulmonary disease) ([place])    Tobacco abuse     Past Surgical History:  Procedure Laterality Date   CARDIAC CATHETERIZATION     COLONOSCOPY N/A 12/29/2015   Procedure: COLONOSCOPY;  Surgeon: Rogene Houston, MD;  [facility];  Service: Endoscopy;  Laterality: N/A;  12:55   ELECTROCARDIOGRAM     Showed inferolateral ST-elevation and a code STEMI was activated. In the ER, he was treated with morphine, herarin, and 600 mg of Plavix. He was transferred  emergently to [facility].    INGUINAL HERNIA REPAIR Left 07/26/2014   Procedure: OPEN REPAIR OF RECURENT LEFT INGUINAL HERNIA REPAIR WITH MESH;  Surgeon: Greer Pickerel, MD;  [facility];  Service: General;  Laterality: Left;   INSERTION OF MESH Bilateral 10/30/2013   Procedure: INSERTION OF MESH;  Surgeon: Gayland Curry, MD;  [facility];  Service: General;  Laterality: Bilateral;   LAPAROSCOPIC INGUINAL HERNIA WITH UMBILICAL HERNIA Bilateral 10/30/2013   Procedure: laparoscopic repair left pantaloom hernia with mesh, laparoscopic right  inguinal hernia with mesh, OPEN REPAIR OF UMBILICAL HERNIA ;  Surgeon: Gayland Curry, MD;  [facility];  Service: General;  Laterality: Bilateral;   LAPAROSCOPY N/A 07/26/2014   Procedure: LAPAROSCOPY DIAGNOSTIC;  Surgeon: Greer Pickerel, MD;  [facility];  Service: General;  Laterality: N/A;   POLYPECTOMY  12/29/2015   Procedure: POLYPECTOMY;  Surgeon: Rogene Houston, MD;  [facility];  Service: Endoscopy;;  ascending colon, descending colon   stent placement     TOTAL HIP ARTHROPLASTY Right 05/10/2015   Procedure: RIGHT TOTAL HIP ARTHROPLASTY ANTERIOR APPROACH;  Surgeon: Paralee Cancel, MD;  [facility];  Service: Orthopedics;  Laterality: Right;   VASECTOMY         Home Medications    Prior to Admission medications   Medication Sig Start Date End Date Taking? Authorizing Provider  albuterol (PROVENTIL HFA;VENTOLIN HFA) 108 (90 Base) MCG/ACT inhaler Inhale 2 puffs into the lungs every 4 (four) hours as needed for wheezing or shortness of breath (AS NEEDED - RESCUE INHALER). 02/17/18   Emeterio Reeve, DO  aspirin EC 81 MG tablet Take 1 tablet (81 mg total) by mouth daily. 02/25/17   Burnell Blanks, MD  atorvastatin (LIPITOR) 40 MG tablet Take 1 tablet (40 mg total) by mouth daily. 12/22/18   Emeterio Reeve, DO  baclofen (LIORESAL) 10 MG tablet Take 1 tablet (10 mg total) by mouth 2 (two) times daily. 01/15/19   Emeterio Reeve, DO  blood glucose meter kit and supplies KIT Dispense based on patient and insurance preference. Use to check BG once daily or every other other.  Dx: E11.9 07/17/16   Sharion Balloon, FNP  diclofenac (VOLTAREN) 75 MG EC tablet Take 1 tablet (75 mg total) by mouth 2 (two) times daily. 11/14/18 11/14/19  Silverio Decamp, MD  ergocalciferol (VITAMIN D2) 1.25 MG (50000 UT) capsule Tale 1 capsule by mouth weekly for 4 weeks, then 1 capsule every 30 days for maintenance 09/08/18   Trixie Dredge, PA-C  fluticasone  [facility]) 50 MCG/ACT nasal spray Place 2 sprays into both nostrils daily. 09/25/16   Sharion Balloon, FNP  gabapentin (NEURONTIN) 800 MG tablet Take 1 tablet (800 mg total) by mouth 3 (three) times daily. 11/14/18   Silverio Decamp, MD  glipiZIDE (GLUCOTROL) 10 MG tablet Take 1 tablet (10 mg total) by mouth 2 (two) times daily before a meal. 12/22/18   Emeterio Reeve, DO  ipratropium-albuterol (DUONEB) 0.5-2.5 (3) MG/3ML SOLN Take 3 mLs by nebulization every 4 (four) hours as needed (wheeze, cough, short of breath. Max use 6 vials per day). 05/27/18   Emeterio Reeve, DO  lisinopril (ZESTRIL) 5 MG tablet Take 1 tablet (5 mg total) by mouth daily. 12/22/18   Emeterio Reeve, DO  metFORMIN (GLUCOPHAGE-XR) 500 MG 24 hr tablet Take 2 tablets (1,000 mg total) by mouth daily with breakfast. Short term until mail order arrives 10/21/18   Blair,  Jade L, PA-C  metoprolol succinate (TOPROL-XL) 25 MG 24 hr tablet Take 0.5 tablets (12.5 mg total) by mouth daily. 01/16/18   Emeterio Reeve, DO  mirtazapine (REMERON) 30 MG tablet Take 0.5 tablets (15 mg total) by mouth at bedtime. 09/08/18   Trixie Dredge, PA-C  Nebulizers ([place]) [place] Per insurance coverage and patient preference. Dx COPD 03/12/18   Emeterio Reeve, DO  Respiratory Therapy Supplies (NEBULIZER/TUBING/MOUTHPIECE) KIT Per insurance coverage and patient preference. Dx COPD 03/12/18   Emeterio Reeve, DO  sennosides-docusate sodium (SENOKOT-S) 8.6-50 MG tablet Take 1 tablet by mouth 3 (three) times daily as needed for constipation.     [provider]  sildenafil (VIAGRA) 100 MG tablet Take 0.5-1 tablets (50-100 mg total) by mouth daily as needed for erectile dysfunction. 07/14/18   Emeterio Reeve, DO  Tiotropium Bromide Monohydrate (SPIRIVA RESPIMAT) 2.5 MCG/ACT AERS Inhale 2 puffs into the lungs daily. 07/30/18   Margaretha Seeds, MD  traMADol (ULTRAM) 50 MG tablet Take 1 tablet (50 mg total)  by mouth every 6 (six) hours as needed. 01/30/19 01/30/20  Fransico Meadow, PA-C  traZODone (DESYREL) 100 MG tablet Take 1 tablet (100 mg total) by mouth at bedtime. 01/08/19   Donella Stade, PA-C    Family History Family History  Problem Relation Age of Onset   Coronary artery disease Father    Depression Father    Heart attack Father    Depression Mother     Social History Social History   Tobacco Use   Smoking status: Light Tobacco Smoker    Packs/day: 1.50    Years: 45.00    Pack years: 67.50    Types: Cigarettes   Smokeless tobacco: Never Used  Substance Use Topics   Alcohol use: No    Comment: past hx of ETOH use was in inpt facility per court order  - 20 years, 10-18-2015 per pt not anymore,per pt stopped about 15-20 yrs ago   Drug use: No    Types: Marijuana, Heroin, Cocaine    Comment: snorted heroin and cocaine, 10-18-2015 per pt in the past he did Marijuana only and nothing else     Allergies   Flomax [tamsulosin hcl] and Penicillins   Review of Systems Review of Systems  All other systems reviewed and are negative.    Physical Exam Triage Vital Signs ED Triage Vitals  Enc Vitals Group     BP 01/30/19 1442 124/70     Pulse Rate 01/30/19 1442 74     Resp 01/30/19 1442 20     Temp 01/30/19 1442 98.4 F (36.9 C)     Temp Source 01/30/19 1442 Oral     SpO2 01/30/19 1442 90 %     Weight 01/30/19 1443 210 lb (95.3 kg)     Height 01/30/19 1443 5' 8"  (1.727 m)     Head Circumference --      Peak Flow --      Pain Score 01/30/19 1443 3     Pain Loc --      Pain Edu? --      Excl. in [place]? --    No data found.  Updated Vital Signs BP 124/70 (BP Location: Right Arm)    Pulse 74    Temp 98.4 F (36.9 C) (Oral)    Resp 20    Ht 5' 8"  (1.727 m)    Wt 95.3 kg    SpO2 90%    BMI 31.93 kg/m  Visual Acuity Right Eye Distance:   Left Eye Distance:   Bilateral Distance:    Right Eye Near:   Left Eye Near:    Bilateral Near:     Physical  Exam Vitals and nursing note reviewed.  Constitutional:      Appearance: He is well-developed.  HENT:     Head: Normocephalic.  Cardiovascular:     Rate and Rhythm: Normal rate and regular rhythm.     Heart sounds: Normal heart sounds.  Pulmonary:     Effort: Pulmonary effort is normal.     Breath sounds: Normal breath sounds.  Abdominal:     General: There is no distension.     Palpations: Abdomen is soft.  Musculoskeletal:        General: Normal range of motion.     Cervical back: Normal range of motion.  Skin:    General: Skin is warm.  Neurological:     General: No focal deficit present.     Mental Status: He is alert and oriented to person, place, and time.  Psychiatric:        Mood and Affect: Mood normal.      UC Treatments / Results  Labs (all labs ordered are listed, but only abnormal results are displayed) Labs Reviewed - No data to display  EKG   Radiology DG Ribs Unilateral W/Chest Left  Result Date: 01/30/2019 CLINICAL DATA:  Fall. EXAM: LEFT RIBS AND CHEST - 3+ VIEW COMPARISON:  Chest x-ray 02/06/2018.  CT 07/15/2018. FINDINGS: Mediastinum and hilar structures normal. Heart size normal. Diffuse bilateral interstitial prominence again noted consistent chronic interstitial lung disease. Mild bibasilar subsegmental atelectasis. No pleural effusion or pneumothorax. Old left seventh and eighth rib fractures are noted. An acute nondisplaced left posterolateral eighth rib fracture cannot be excluded. IMPRESSION: 1. Chronic interstitial lung disease. Mild bibasilar subsegmental atelectasis. 2. Old seventh and eighth left rib fractures. An acute nondisplaced left posterolateral eighth rib fracture cannot be excluded. No pneumothorax. Electronically Signed   By: Marcello Moores  Register   On: 01/30/2019 15:20    Procedures Procedures (including critical care time)  Medications Ordered in UC Medications - No data to display  Initial Impression / Assessment and Plan / UC  Course  I have reviewed the triage vital signs and the nursing notes.  Pertinent labs & imaging results that were available during my care of the patient were reviewed by me and considered in my medical decision making (see chart for details).     MDM:  Xray shows left 8th rib fracture.  Pt also has 2 healed fractures.  Pt given incentive spirometer and rx for tramadol.  Pt advised to see his MD for recheck next week  Final Clinical Impressions(s) / UC Diagnoses   Final diagnoses:  Closed fracture of one rib of left side, initial encounter     Discharge Instructions     See Dr. Sheppard Coil for recheck next week    ED Prescriptions    Medication Sig Dispense Auth. Provider   traMADol (ULTRAM) 50 MG tablet Take 1 tablet (50 mg total) by mouth every 6 (six) hours as needed. 20 tablet Fransico Meadow, [place]     I have reviewed the PDMP during this encounter.  An After Visit Summary was printed and given to the patient.    Fransico Meadow, [place] 01/30/19 1646

## 2019-01-30 NOTE — Discharge Instructions (Addendum)
See Dr. Sheppard Coil for recheck next week

## 2019-01-30 NOTE — ED Triage Notes (Signed)
Pt was working in the yard on Tuesday, and fell over a Surveyor, quantity.  Hit left ribs.  Has tried to see if it would get better, but continues to be painful.  Hard to sleep last night.

## 2019-02-02 ENCOUNTER — Telehealth: Payer: Self-pay

## 2019-02-02 NOTE — Telephone Encounter (Signed)
Yes can take both

## 2019-02-02 NOTE — Telephone Encounter (Signed)
Anthony [name] called and left a message asking if he could take diclofenac with tramadol. Please advise.

## 2019-02-02 NOTE — Telephone Encounter (Signed)
Left a message advising of recommendations.  

## 2019-02-03 ENCOUNTER — Ambulatory Visit (INDEPENDENT_AMBULATORY_CARE_PROVIDER_SITE_OTHER): Payer: Medicare Other | Admitting: Osteopathic Medicine

## 2019-02-03 ENCOUNTER — Encounter: Payer: Self-pay | Admitting: Osteopathic Medicine

## 2019-02-03 VITALS — Wt 211.0 lb

## 2019-02-03 DIAGNOSIS — G4709 Other insomnia: Secondary | ICD-10-CM | POA: Diagnosis not present

## 2019-02-03 DIAGNOSIS — X58XXXA Exposure to other specified factors, initial encounter: Secondary | ICD-10-CM | POA: Diagnosis not present

## 2019-02-03 DIAGNOSIS — S2239XA Fracture of one rib, unspecified side, initial encounter for closed fracture: Secondary | ICD-10-CM

## 2019-02-03 MED ORDER — MIRTAZAPINE 15 MG PO TABS: ORAL_TABLET | ORAL | 0 refills | Status: DC

## 2019-02-03 MED ORDER — TRAMADOL HCL 50 MG PO TABS: 50.0000 mg | ORAL_TABLET | Freq: Four times a day (QID) | ORAL | 0 refills | Status: DC | PRN

## 2019-02-03 NOTE — Progress Notes (Signed)
Virtual Visit via Phone  I connected with      Anthony [name]. on 02/03/19 at 10:47 AM  by a telemedicine application and verified that I am speaking with the correct person using two identifiers.  Patient is in the car   I am in office   I discussed the limitations of evaluation and management by telemedicine and the availability of in person appointments. The patient expressed understanding and agreed to proceed.  History of Present Illness: Anthony [name]. is a 69 y.o. male who would like to discuss insomnia, broken rib pain    Insomnia worse Dx likely rib fracture after fall 01/30/2019 Pain keeping him up at night Insomnia is longstanding Temazepam not too helpful Trazodone not helpful wither, he stopped this  Remeron helped in the past  Requesting refill on pain medications      Observations/Objective: Wt 211 lb (95.7 kg)   BMI 32.08 kg/m  BP Readings from Last 3 Encounters:  01/30/19 124/70  12/22/18 109/72  11/28/18 116/69   Exam: Normal Speech.  NAD  Lab and Radiology Results No results found for this or any previous visit (from the past 72 hour(s)). DG Ribs Unilateral W/Chest Left  Result Date: 01/30/2019 CLINICAL DATA:  Fall. EXAM: LEFT RIBS AND CHEST - 3+ VIEW COMPARISON:  Chest x-ray 02/06/2018.  CT 07/15/2018. FINDINGS: Mediastinum and hilar structures normal. Heart size normal. Diffuse bilateral interstitial prominence again noted consistent chronic interstitial lung disease. Mild bibasilar subsegmental atelectasis. No pleural effusion or pneumothorax. Old left seventh and eighth rib fractures are noted. An acute nondisplaced left posterolateral eighth rib fracture cannot be excluded. IMPRESSION: 1. Chronic interstitial lung disease. Mild bibasilar subsegmental atelectasis. 2. Old seventh and eighth left rib fractures. An acute nondisplaced left posterolateral eighth rib fracture cannot be excluded. No pneumothorax. Electronically Signed   By: Marcello Moores   Register   On: 01/30/2019 15:20       Assessment and Plan: 69 y.o. male with The primary encounter diagnosis was Closed fracture of one rib, unspecified laterality, initial encounter. A diagnosis of Other insomnia was also pertinent to this visit.  Will refill Tramadol for pain control  Advised risk of Tramadol w/ other meds in terms of sedation risk, serotonin syndrome. Would rather he try remeron vs benzo, so ler's put him back on that. D/C temazepam (he's out anyway, reports he lost his last bottle...)   PDMP not reviewed this encounter. No orders of the defined types were placed in this encounter.  Meds ordered this encounter  Medications  . traMADol (ULTRAM) 50 MG tablet    Sig: Take 1 tablet (50 mg total) by mouth every 6 (six) hours as needed for severe pain.    Dispense:  60 tablet    Refill:  0  . mirtazapine (REMERON) 15 MG tablet    Sig: Take 1 tablet (15 mg total) by mouth at bedtime for 7 days, THEN 2 tablets (30 mg total) at bedtime for 7 days.    Dispense:  30 tablet    Refill:  0   There are no Patient Instructions on file for this visit.    Follow Up Instructions: Return in about 2 weeks (around 02/17/2019) for recheck pain / sleep - virtual or phone visit thanks! .    I discussed the assessment and treatment plan with the patient. The patient was provided an opportunity to ask questions and all were answered. The patient agreed with the plan and demonstrated an  understanding of the instructions.   The patient was advised to call back or seek an in-person evaluation if any new concerns, if symptoms worsen or if the condition fails to improve as anticipated.  22 minutes of non-face-to-face time was provided during this encounter.      . . . . . . . . . . . . . Marland Kitchen                   Historical information moved to improve visibility of documentation.  Past Medical History:  Diagnosis Date  . Anxiety   . Arthritis   . Bipolar  disorder ([place])   . CAD (coronary artery disease)    a. INF STEMI 07/04/10:  tx with thrombectomy + [facility] BMS to [facility];  cath 07/04/10: dLM 10-20%, pLAD 40-50%, mLAD 20-30%, pRCA 30%, mRCA occluded and tx with PCI, EF 50% with inf HK. A Multilink  . COPD (chronic obstructive pulmonary disease) ([place])   . Depression   . Glucose intolerance (impaired glucose tolerance)    A1c 6.2 06/2010  . History of DVT (deep vein thrombosis)    traumatic, s/p coumadin tx.  Marland Kitchen HLD (hyperlipidemia)   . Hypertension    pt states is currently on no medications  . Myocardial infarction ([place])    around 2013  . Shortness of breath    walking distance or climbing stairs  . Tobacco abuse   . Urinary frequency   . Vitamin D deficiency 09/05/2018   Past Surgical History:  Procedure Laterality Date  . CARDIAC CATHETERIZATION    . COLONOSCOPY N/A 12/29/2015   Procedure: COLONOSCOPY;  Surgeon: Rogene Houston, MD;  [facility];  Service: Endoscopy;  Laterality: N/A;  12:55  . ELECTROCARDIOGRAM     Showed inferolateral ST-elevation and a code STEMI was activated. In the ER, he was treated with morphine, herarin, and 600 mg of Plavix. He was transferred emergently to [facility].   . INGUINAL HERNIA REPAIR Left 07/26/2014   Procedure: OPEN REPAIR OF RECURENT LEFT INGUINAL HERNIA REPAIR WITH MESH;  Surgeon: Greer Pickerel, MD;  [facility];  Service: General;  Laterality: Left;  . INSERTION OF MESH Bilateral 10/30/2013   Procedure: INSERTION OF MESH;  Surgeon: Gayland Curry, MD;  [facility];  Service: General;  Laterality: Bilateral;  . LAPAROSCOPIC INGUINAL HERNIA WITH UMBILICAL HERNIA Bilateral 10/30/2013   Procedure: laparoscopic repair left pantaloom hernia with mesh, laparoscopic right inguinal hernia with mesh, OPEN REPAIR OF UMBILICAL HERNIA ;  Surgeon: Gayland Curry, MD;  [facility];  Service: General;  Laterality: Bilateral;  . LAPAROSCOPY N/A 07/26/2014   Procedure: LAPAROSCOPY  DIAGNOSTIC;  Surgeon: Greer Pickerel, MD;  [facility];  Service: General;  Laterality: N/A;  . POLYPECTOMY  12/29/2015   Procedure: POLYPECTOMY;  Surgeon: Rogene Houston, MD;  [facility];  Service: Endoscopy;;  ascending colon, descending colon  . stent placement    . TOTAL HIP ARTHROPLASTY Right 05/10/2015   Procedure: RIGHT TOTAL HIP ARTHROPLASTY ANTERIOR APPROACH;  Surgeon: Paralee Cancel, MD;  [facility];  Service: Orthopedics;  Laterality: Right;  Marland Kitchen VASECTOMY     Social History   Tobacco Use  . Smoking status: Light Tobacco Smoker    Packs/day: 1.50    Years: 45.00    Pack years: 67.50    Types: Cigarettes  . Smokeless tobacco: Never Used  Substance Use Topics  . Alcohol use: No  Comment: past hx of ETOH use was in inpt facility per court order  - 20 years, 10-18-2015 per pt not anymore,per pt stopped about 15-20 yrs ago   family history includes Coronary artery disease in his father; Depression in his father and mother; Heart attack in his father.  Medications: Current Outpatient Medications  Medication Sig Dispense Refill  . albuterol (PROVENTIL HFA;VENTOLIN HFA) 108 (90 Base) MCG/ACT inhaler Inhale 2 puffs into the lungs every 4 (four) hours as needed for wheezing or shortness of breath (AS NEEDED - RESCUE INHALER). 1 Inhaler 1  . aspirin EC 81 MG tablet Take 1 tablet (81 mg total) by mouth daily. 90 tablet 3  . atorvastatin (LIPITOR) 40 MG tablet Take 1 tablet (40 mg total) by mouth daily. 90 tablet 3  . baclofen (LIORESAL) 10 MG tablet Take 1 tablet (10 mg total) by mouth 2 (two) times daily. 180 tablet 0  . blood glucose meter kit and supplies KIT Dispense based on patient and insurance preference. Use to check BG once daily or every other other.  Dx: E11.9 1 each 0  . diclofenac (VOLTAREN) 75 MG EC tablet Take 1 tablet (75 mg total) by mouth 2 (two) times daily. 180 tablet 3  . ergocalciferol (VITAMIN D2) 1.25 MG (50000 UT) capsule Tale 1 capsule by  mouth weekly for 4 weeks, then 1 capsule every 30 days for maintenance 6 capsule 1  . fluticasone (FLONASE) 50 MCG/ACT nasal spray Place 2 sprays into both nostrils daily. 16 g 6  . gabapentin (NEURONTIN) 800 MG tablet Take 1 tablet (800 mg total) by mouth 3 (three) times daily. 270 tablet 3  . glipiZIDE (GLUCOTROL) 10 MG tablet Take 1 tablet (10 mg total) by mouth 2 (two) times daily before a meal. 180 tablet 3  . ipratropium-albuterol (DUONEB) 0.5-2.5 (3) MG/3ML SOLN Take 3 mLs by nebulization every 4 (four) hours as needed (wheeze, cough, short of breath. Max use 6 vials per day). 360 mL 99  . lisinopril (ZESTRIL) 5 MG tablet Take 1 tablet (5 mg total) by mouth daily. 90 tablet 3  . metFORMIN (GLUCOPHAGE-XR) 500 MG 24 hr tablet Take 2 tablets (1,000 mg total) by mouth daily with breakfast. Short term until mail order arrives 60 tablet 0  . metoprolol succinate (TOPROL-XL) 25 MG 24 hr tablet Take 0.5 tablets (12.5 mg total) by mouth daily. 90 tablet 0  . mirtazapine (REMERON) 15 MG tablet Take 1 tablet (15 mg total) by mouth at bedtime for 7 days, THEN 2 tablets (30 mg total) at bedtime for 7 days. 30 tablet 0  . Nebulizers (NEBULIZER COMPRESSOR) MISC Per insurance coverage and patient preference. Dx COPD 1 each 1  . Respiratory Therapy Supplies (NEBULIZER/TUBING/MOUTHPIECE) KIT Per insurance coverage and patient preference. Dx COPD 1 each 53  . sennosides-docusate sodium (SENOKOT-S) 8.6-50 MG tablet Take 1 tablet by mouth 3 (three) times daily as needed for constipation.     . sildenafil (VIAGRA) 100 MG tablet Take 0.5-1 tablets (50-100 mg total) by mouth daily as needed for erectile dysfunction. 10 tablet 11  . Tiotropium Bromide Monohydrate (SPIRIVA RESPIMAT) 2.5 MCG/ACT AERS Inhale 2 puffs into the lungs daily. 4 g 0  . traMADol (ULTRAM) 50 MG tablet Take 1 tablet (50 mg total) by mouth every 6 (six) hours as needed for severe pain. 60 tablet 0   No current facility-administered medications for  this visit.   Allergies  Allergen Reactions  . Flomax [Tamsulosin Hcl] Other (See Comments)  This medication makes patient feel sick  . Penicillins Other (See Comments)    Reaction unknown occurred during childhood Has patient had a PCN reaction causing immediate rash, facial/tongue/throat swelling, SOB or lightheadedness with hypotension: unsure - childhood reaction Has patient had a PCN reaction causing severe rash involving mucus membranes or skin necrosis: unsure - childhood reaction Has patient had a PCN reaction that required hospitalization no Has patient had a PCN reaction occurring within the last 10 years: no If all of the above answers are "NO", then may p

## 2019-02-18 ENCOUNTER — Ambulatory Visit (INDEPENDENT_AMBULATORY_CARE_PROVIDER_SITE_OTHER): Payer: Medicare Other | Admitting: Osteopathic Medicine

## 2019-02-18 ENCOUNTER — Encounter: Payer: Self-pay | Admitting: Osteopathic Medicine

## 2019-02-18 VITALS — Wt 210.0 lb

## 2019-02-18 DIAGNOSIS — R5382 Chronic fatigue, unspecified: Secondary | ICD-10-CM

## 2019-02-18 DIAGNOSIS — I10 Essential (primary) hypertension: Secondary | ICD-10-CM

## 2019-02-18 DIAGNOSIS — F5105 Insomnia due to other mental disorder: Secondary | ICD-10-CM

## 2019-02-18 DIAGNOSIS — R918 Other nonspecific abnormal finding of lung field: Secondary | ICD-10-CM | POA: Diagnosis not present

## 2019-02-18 DIAGNOSIS — F324 Major depressive disorder, single episode, in partial remission: Secondary | ICD-10-CM

## 2019-02-18 DIAGNOSIS — F17200 Nicotine dependence, unspecified, uncomplicated: Secondary | ICD-10-CM | POA: Diagnosis not present

## 2019-02-18 DIAGNOSIS — F99 Mental disorder, not otherwise specified: Secondary | ICD-10-CM

## 2019-02-18 NOTE — Progress Notes (Signed)
Virtual Visit via Phone Note  I connected with      Anthony [name]. on 02/18/19 at 1:03 PM  by a telemedicine application and verified that I am speaking with the correct person using two identifiers.  Patient is at home I am in office   I discussed the limitations of evaluation and management by telemedicine and the availability of in person appointments. The patient expressed understanding and agreed to proceed.  History of Present Illness: Anthony [name]. is a 69 y.o. male who would like to discuss    Most recent visit 02/03/2019 Insomnia worse, chronic issue Dx likely rib fracture after fall 01/30/2019. Pain keeping him up at night Temazepam not too helpful Trazodone not helpful either, he stopped this  Remeron helped in the past - refilled this  Requesting refill on pain medications - refilled Tramadol   Today 02/18/19:  Sleeping a lot, meds help him get to sleep but he wakes frequently Taking Remeron and Trazodone and Melatonin (Melatonin is in combo w/ other herbals).  He reports he is still feeling tired He goes to bed around 0200, intermittently wakes every few hours, stays in bed some days until 1300       Observations/Objective: Wt 210 lb (95.3 kg)   BMI 31.93 kg/m  BP Readings from Last 3 Encounters:  01/30/19 124/70  12/22/18 109/72  11/28/18 116/69   Exam: Normal Speech.  NAD  Lab and Radiology Results No results found for this or any previous visit (from the past 72 hour(s)). No results found.     Assessment and Plan: 69 y.o. male with The primary encounter diagnosis was Chronic fatigue. Diagnoses of Insomnia due to other mental disorder, Multiple lung nodules, Tobacco dependence, Essential hypertension, and Depression, major, single episode, in partial remission ([place]) were also pertinent to this visit.  OK to continue current meds Insomnia likely multifactorial w/ components of psychiatric, COPD, sedentary lifestyle, poor sleep  hygiene, OSA. I'd hesitate to change Rx without input from pulmonary / psychiatry first. Again reviewed importance of sleep hygiene, regular sleep schedule    PDMP not reviewed this encounter. No orders of the defined types were placed in this encounter.  No orders of the defined types were placed in this encounter.  There are no Patient Instructions on file for this visit.     Follow Up Instructions: Return in about 3 months (around 05/18/2019) for IN-OFFICE VISIT for regular check-up .    I discussed the assessment and treatment plan with the patient. The patient was provided an opportunity to ask questions and all were answered. The patient agreed with the plan and demonstrated an understanding of the instructions.   The patient was advised to call back or seek an in-person evaluation if any new concerns, if symptoms worsen or if the condition fails to improve as anticipated.  30 minutes of non-face-to-face time was provided during this encounter.      . . . . . . . . . . . . . Marland Kitchen                   Historical information moved to improve visibility of documentation.  Past Medical History:  Diagnosis Date  . Anxiety   . Arthritis   . Bipolar disorder ([place])   . CAD (coronary artery disease)    a. INF STEMI 07/04/10:  tx with thrombectomy + [facility] to mRCA;  cath 07/04/10: dLM 10-20%, pLAD 40-50%, mLAD 20-30%,  pRCA 30%, mRCA occluded and tx with PCI, EF 50% with inf HK. A Multilink  . COPD (chronic obstructive pulmonary disease) ([place])   . Depression   . Glucose intolerance (impaired glucose tolerance)    A1c 6.2 06/2010  . History of DVT (deep vein thrombosis)    traumatic, s/p coumadin tx.  Marland Kitchen HLD (hyperlipidemia)   . Hypertension    pt states is currently on no medications  . Myocardial infarction ([place])    around 2013  . Shortness of breath    walking distance or climbing stairs  . Tobacco abuse   . Urinary frequency   . Vitamin D  deficiency 09/05/2018   Past Surgical History:  Procedure Laterality Date  . CARDIAC CATHETERIZATION    . COLONOSCOPY N/A 12/29/2015   Procedure: COLONOSCOPY;  Surgeon: Rogene Houston, MD;  [facility];  Service: Endoscopy;  Laterality: N/A;  12:55  . ELECTROCARDIOGRAM     Showed inferolateral ST-elevation and a code STEMI was activated. In the ER, he was treated with morphine, herarin, and 600 mg of Plavix. He was transferred emergently to [facility].   . INGUINAL HERNIA REPAIR Left 07/26/2014   Procedure: OPEN REPAIR OF RECURENT LEFT INGUINAL HERNIA REPAIR WITH MESH;  Surgeon: Greer Pickerel, MD;  [facility];  Service: General;  Laterality: Left;  . INSERTION OF MESH Bilateral 10/30/2013   Procedure: INSERTION OF MESH;  Surgeon: Gayland Curry, MD;  [facility];  Service: General;  Laterality: Bilateral;  . LAPAROSCOPIC INGUINAL HERNIA WITH UMBILICAL HERNIA Bilateral 10/30/2013   Procedure: laparoscopic repair left pantaloom hernia with mesh, laparoscopic right inguinal hernia with mesh, OPEN REPAIR OF UMBILICAL HERNIA ;  Surgeon: Gayland Curry, MD;  [facility];  Service: General;  Laterality: Bilateral;  . LAPAROSCOPY N/A 07/26/2014   Procedure: LAPAROSCOPY DIAGNOSTIC;  Surgeon: Greer Pickerel, MD;  [facility];  Service: General;  Laterality: N/A;  . POLYPECTOMY  12/29/2015   Procedure: POLYPECTOMY;  Surgeon: Rogene Houston, MD;  [facility];  Service: Endoscopy;;  ascending colon, descending colon  . stent placement    . TOTAL HIP ARTHROPLASTY Right 05/10/2015   Procedure: RIGHT TOTAL HIP ARTHROPLASTY ANTERIOR APPROACH;  Surgeon: Paralee Cancel, MD;  [facility];  Service: Orthopedics;  Laterality: Right;  Marland Kitchen VASECTOMY     Social History   Tobacco Use  . Smoking status: Light Tobacco Smoker    Packs/day: 1.50    Years: 45.00    Pack years: 67.50    Types: Cigarettes  . Smokeless tobacco: Never Used  Substance Use Topics  . Alcohol  use: No    Comment: past hx of ETOH use was in inpt facility per court order  - 20 years, 10-18-2015 per pt not anymore,per pt stopped about 15-20 yrs ago   family history includes Coronary artery disease in his father; Depression in his father and mother; Heart attack in his father.  Medications: Current Outpatient Medications  Medication Sig Dispense Refill  . albuterol (PROVENTIL HFA;VENTOLIN HFA) 108 (90 Base) MCG/ACT inhaler Inhale 2 puffs into the lungs every 4 (four) hours as needed for wheezing or shortness of breath (AS NEEDED - RESCUE INHALER). 1 Inhaler 1  . aspirin EC 81 MG tablet Take 1 tablet (81 mg total) by mouth daily. 90 tablet 3  . atorvastatin (LIPITOR) 40 MG tablet Take 1 tablet (40 mg total) by mouth daily. 90 tablet 3  . baclofen (LIORESAL) 10 MG  tablet Take 1 tablet (10 mg total) by mouth 2 (two) times daily. 180 tablet 0  . blood glucose meter kit and supplies KIT Dispense based on patient and insurance preference. Use to check BG once daily or every other other.  Dx: E11.9 1 each 0  . diclofenac (VOLTAREN) 75 MG EC tablet Take 1 tablet (75 mg total) by mouth 2 (two) times daily. 180 tablet 3  . ergocalciferol (VITAMIN D2) 1.25 MG (50000 UT) capsule Tale 1 capsule by mouth weekly for 4 weeks, then 1 capsule every 30 days for maintenance 6 capsule 1  . fluticasone (FLONASE) 50 MCG/ACT nasal spray Place 2 sprays into both nostrils daily. 16 g 6  . gabapentin (NEURONTIN) 800 MG tablet Take 1 tablet (800 mg total) by mouth 3 (three) times daily. 270 tablet 3  . glipiZIDE (GLUCOTROL) 10 MG tablet Take 1 tablet (10 mg total) by mouth 2 (two) times daily before a meal. 180 tablet 3  . ipratropium-albuterol (DUONEB) 0.5-2.5 (3) MG/3ML SOLN Take 3 mLs by nebulization every 4 (four) hours as needed (wheeze, cough, short of breath. Max use 6 vials per day). 360 mL 99  . lisinopril (ZESTRIL) 5 MG tablet Take 1 tablet (5 mg total) by mouth daily. 90 tablet 3  . metFORMIN  (GLUCOPHAGE-XR) 500 MG 24 hr tablet Take 2 tablets (1,000 mg total) by mouth daily with breakfast. Short term until mail order arrives 60 tablet 0  . metoprolol succinate (TOPROL-XL) 25 MG 24 hr tablet Take 0.5 tablets (12.5 mg total) by mouth daily. 90 tablet 0  . Nebulizers (NEBULIZER COMPRESSOR) MISC Per insurance coverage and patient preference. Dx COPD 1 each 1  . Respiratory Therapy Supplies (NEBULIZER/TUBING/MOUTHPIECE) KIT Per insurance coverage and patient preference. Dx COPD 1 each 46  . sennosides-docusate sodium (SENOKOT-S) 8.6-50 MG tablet Take 1 tablet by mouth 3 (three) times daily as needed for constipation.     . sildenafil (VIAGRA) 100 MG tablet Take 0.5-1 tablets (50-100 mg total) by mouth daily as needed for erectile dysfunction. 10 tablet 11  . Tiotropium Bromide Monohydrate (SPIRIVA RESPIMAT) 2.5 MCG/ACT AERS Inhale 2 puffs into the lungs daily. 4 g 0  . traMADol (ULTRAM) 50 MG tablet Take 1 tablet (50 mg total) by mouth every 6 (six) hours as needed for severe pain. 60 tablet 0  . mirtazapine (REMERON) 15 MG tablet Take 1 tablet (15 mg total) by mouth at bedtime for 7 days, THEN 2 tablets (30 mg total) at bedtime for 7 days. 30 tablet 0   No current facility-administered medications for this visit.   Allergies  Allergen Reactions  . Flomax [Tamsulosin Hcl] Other (See Comments)    This medication makes patient feel sick  . Penicillins Other (See Comments)    Reaction unknown occurred during childhood Has patient had a PCN reaction causing immediate rash, facial/tongue/throat swelling, SOB or lightheadedness with hypotension: unsure - childhood reaction Has patient had a PCN reaction causing severe rash involving mucus membranes or skin necrosis: unsure - childhood reaction Has patient had a PCN reaction that required hospitalization no Has patient had a PCN reaction occurring within the last 10 years: no If all of the above answers are "NO", then may p

## 2019-02-18 NOTE — Progress Notes (Signed)
Called pt at 1150 am. No answer, left a detailed vm msg.

## 2019-02-23 ENCOUNTER — Telehealth: Payer: Self-pay

## 2019-02-23 NOTE — Telephone Encounter (Signed)
OK with [place] (Dr Zigmund Daniel and I chatted about this)

## 2019-02-23 NOTE — Telephone Encounter (Signed)
Anthony [name] would like to change his PCP to Dr Zigmund Daniel. Please advise.

## 2019-02-23 NOTE — Telephone Encounter (Signed)
Ok with me 

## 2019-02-23 NOTE — Telephone Encounter (Signed)
Left message for a return call

## 2019-02-24 NOTE — Telephone Encounter (Signed)
Patient advised and scheduled.  

## 2019-03-19 ENCOUNTER — Other Ambulatory Visit: Payer: Self-pay

## 2019-03-19 DIAGNOSIS — G4709 Other insomnia: Secondary | ICD-10-CM

## 2019-03-19 NOTE — Telephone Encounter (Signed)
Daking wants to increase the mirtazapine to 2 tablets nightly. He needs a refill.

## 2019-03-20 MED ORDER — MIRTAZAPINE 30 MG PO TABS: 30.0000 mg | ORAL_TABLET | Freq: Every day | ORAL | 1 refills | Status: DC

## 2019-03-20 NOTE — Telephone Encounter (Signed)
I sent in 30 mg tablets, be sure that he knows to take just 1 pill

## 2019-03-20 NOTE — Telephone Encounter (Signed)
Left message advising of recommendations.  

## 2019-03-23 ENCOUNTER — Ambulatory Visit: Payer: Medicare Other | Admitting: Family Medicine

## 2019-03-23 ENCOUNTER — Ambulatory Visit: Payer: Medicare Other | Admitting: Osteopathic Medicine

## 2019-03-24 ENCOUNTER — Ambulatory Visit (INDEPENDENT_AMBULATORY_CARE_PROVIDER_SITE_OTHER): Payer: Medicare Other | Admitting: Family Medicine

## 2019-03-24 ENCOUNTER — Encounter: Payer: Self-pay | Admitting: Family Medicine

## 2019-03-24 ENCOUNTER — Other Ambulatory Visit: Payer: Self-pay

## 2019-03-24 DIAGNOSIS — K5909 Other constipation: Secondary | ICD-10-CM | POA: Diagnosis not present

## 2019-03-24 DIAGNOSIS — G4709 Other insomnia: Secondary | ICD-10-CM

## 2019-03-24 MED ORDER — POLYETHYLENE GLYCOL 3350 17 GM/SCOOP PO POWD: 17.0000 g | Freq: Two times a day (BID) | ORAL | 1 refills | Status: DC | PRN

## 2019-03-24 MED ORDER — MIRTAZAPINE 7.5 MG PO TABS: 7.5000 mg | ORAL_TABLET | Freq: Every day | ORAL | 0 refills | Status: DC

## 2019-03-24 NOTE — Assessment & Plan Note (Signed)
Chronic, multifactorial with underlying psychiatric illnesses and poor sleep hygiene.   Will reduce mirtazapine to 7.5mg  as this is likely to have more sedating effects at lower dose.

## 2019-03-24 NOTE — Patient Instructions (Addendum)
Please discontinue the senokot tabs Start miralax 1-2 scoops mixed in gatorade daily.   Reduce mirtazapine to 7.5mg  daily.  Keep appt with me later this weeks.     Constipation, Adult Constipation is when a person has fewer bowel movements in a week than normal, has difficulty having a bowel movement, or has stools that are dry, hard, or larger than normal. Constipation may be caused by an underlying condition. It may become worse with age if a person takes certain medicines and does not take in enough fluids. Follow these instructions at home: Eating and drinking   Eat foods that have a lot of fiber, such as fresh fruits and vegetables, whole grains, and beans.  Limit foods that are high in fat, low in fiber, or overly processed, such as french fries, hamburgers, cookies, candies, and soda.  Drink enough fluid to keep your urine clear or pale yellow. General instructions  Exercise regularly or as told by your health care provider.  Go to the restroom when you have the urge to go. Do not hold it in.  Take over-the-counter and prescription medicines only as told by your health care provider. These include any fiber supplements.  Practice pelvic floor retraining exercises, such as deep breathing while relaxing the lower abdomen and pelvic floor relaxation during bowel movements.  Watch your condition for any changes.  Keep all follow-up visits as told by your health care provider. This is important. Contact a health care provider if:  You have pain that gets worse.  You have a fever.  You do not have a bowel movement after 4 days.  You vomit.  You are not hungry.  You lose weight.  You are bleeding from the anus.  You have thin, pencil-like stools. Get help right away if:  You have a fever and your symptoms suddenly get worse.  You leak stool or have blood in your stool.  Your abdomen is bloated.  You have severe pain in your abdomen.  You feel dizzy or you  faint. This information is not intended to replace advice given to you by your health care provider. Make sure you discuss any questions you have with your health care provider. Document Revised: 12/07/2016 Document Reviewed: 06/15/2015 Elsevier Patient Education  2020 [employer].

## 2019-03-24 NOTE — Progress Notes (Signed)
Anthony [name]. - 69 y.o. male MRN [id number]  Date of birth: 1950/03/23  Subjective Chief Complaint  Patient presents with  . Follow-up    HPI Anthony [name]. is a 69 y.o. male with history of T2DM, chronic low back and hip pain, depression with insomnia here today for same day visit with complaint of constipation.    He reports having issues with constipation for several years but worsening recently.  He is taking mirtazapine and tramadol.  He also never discontinued trazodone as directed by Dr. Sheppard [name] at previous appt.   He was advised that he should not be taking trazodone by one of our staff members again when he called to request refill recently so he has now stopped.  He has tried to resolve his constipation with senokot (20 tablets yesterday) and fleet glycerin suppository without much relief.  He does report some cramping and pain.  He denies nausea or changes to appetite.  He has had increased insomnia as well since stopping trazodone.  He has been on Temazepam previously but not very helpful.  He has poor sleep hygiene routine which is likely contributing to his overall insomnia as well.  ROS:  A comprehensive ROS was completed and negative except as noted per HPI   Allergies  Allergen Reactions  . Flomax [Tamsulosin Hcl] Other (See Comments)    This medication makes patient feel sick  . Penicillins Other (See Comments)    Reaction unknown occurred during childhood Has patient had a PCN reaction causing immediate rash, facial/tongue/throat swelling, SOB or lightheadedness with hypotension: unsure - childhood reaction Has patient had a PCN reaction causing severe rash involving mucus membranes or skin necrosis: unsure - childhood reaction Has patient had a PCN reaction that required hospitalization no Has patient had a PCN reaction occurring within the last 10 years: no If all of the above answers are "NO", then may p    Past Medical History:  Diagnosis Date  .  Anxiety   . Arthritis   . Bipolar disorder ([place])   . CAD (coronary artery disease)    a. INF STEMI 07/04/10:  tx with thrombectomy + [facility] to [facility];  cath 07/04/10: dLM 10-20%, pLAD 40-50%, mLAD 20-30%, pRCA 30%, mRCA occluded and tx with PCI, EF 50% with inf HK. A Multilink  . COPD (chronic obstructive pulmonary disease) ([place])   . Depression   . Glucose intolerance (impaired glucose tolerance)    A1c 6.2 06/2010  . History of DVT (deep vein thrombosis)    traumatic, s/p coumadin tx.  Marland Kitchen HLD (hyperlipidemia)   . Hypertension    pt states is currently on no medications  . Myocardial infarction ([place])    around 2013  . Shortness of breath    walking distance or climbing stairs  . Tobacco abuse   . Urinary frequency   . Vitamin D deficiency 09/05/2018    Past Surgical History:  Procedure Laterality Date  . CARDIAC CATHETERIZATION    . COLONOSCOPY N/A 12/29/2015   Procedure: COLONOSCOPY;  Surgeon: Rogene Houston, MD;  [facility];  Service: Endoscopy;  Laterality: N/A;  12:55  . ELECTROCARDIOGRAM     Showed inferolateral ST-elevation and a code STEMI was activated. In the ER, he was treated with morphine, herarin, and 600 mg of Plavix. He was transferred emergently to [facility].   . INGUINAL HERNIA REPAIR Left 07/26/2014   Procedure: OPEN REPAIR OF RECURENT LEFT INGUINAL HERNIA REPAIR WITH  MESH;  Surgeon: Greer Pickerel, MD;  [facility];  Service: General;  Laterality: Left;  . INSERTION OF MESH Bilateral 10/30/2013   Procedure: INSERTION OF MESH;  Surgeon: Gayland Curry, MD;  [facility];  Service: General;  Laterality: Bilateral;  . LAPAROSCOPIC INGUINAL HERNIA WITH UMBILICAL HERNIA Bilateral 10/30/2013   Procedure: laparoscopic repair left pantaloom hernia with mesh, laparoscopic right inguinal hernia with mesh, OPEN REPAIR OF UMBILICAL HERNIA ;  Surgeon: Gayland Curry, MD;  [facility];  Service: General;  Laterality: Bilateral;  . LAPAROSCOPY N/A  07/26/2014   Procedure: LAPAROSCOPY DIAGNOSTIC;  Surgeon: Greer Pickerel, MD;  [facility];  Service: General;  Laterality: N/A;  . POLYPECTOMY  12/29/2015   Procedure: POLYPECTOMY;  Surgeon: Rogene Houston, MD;  [facility];  Service: Endoscopy;;  ascending colon, descending colon  . stent placement    . TOTAL HIP ARTHROPLASTY Right 05/10/2015   Procedure: RIGHT TOTAL HIP ARTHROPLASTY ANTERIOR APPROACH;  Surgeon: Paralee Cancel, MD;  [facility];  Service: Orthopedics;  Laterality: Right;  Marland Kitchen VASECTOMY      Social History   Socioeconomic History  . Marital status: Divorced    Spouse name: Not on file  . Number of children: Not on file  . Years of education: Not on file  . Highest education level: Not on file  Occupational History  . Not on file  Tobacco Use  . Smoking status: Light Tobacco Smoker    Packs/day: 1.50    Years: 45.00    Pack years: 67.50    Types: Cigarettes  . Smokeless tobacco: Never Used  Substance and Sexual Activity  . Alcohol use: No    Comment: past hx of ETOH use was in inpt facility per court order  - 20 years, 10-18-2015 per pt not anymore,per pt stopped about 15-20 yrs ago  . Drug use: No    Types: Marijuana, Heroin, Cocaine    Comment: snorted heroin and cocaine, 10-18-2015 per pt in the past he did Marijuana only and nothing else  . Sexual activity: Not Currently    Partners: Female  Other Topics Concern  . Not on file  Social History Narrative   Lives in [place]. He lives alone. He has kids but is not married.    Social Determinants of Health   Financial Resource Strain:   . Difficulty of Paying Living Expenses:   Food Insecurity:   . Worried About Charity fundraiser in the Last Year:   . Arboriculturist in the Last Year:   Transportation Needs:   . Film/video editor (Medical):   Marland Kitchen Lack of Transportation (Non-Medical):   Physical Activity:   . Days of Exercise per Week:   . Minutes of Exercise per Session:   Stress:    . Feeling of Stress :   Social Connections:   . Frequency of Communication with Friends and Family:   . Frequency of Social Gatherings with Friends and Family:   . Attends Religious Services:   . Active Member of Clubs or Organizations:   . Attends Archivist Meetings:   Marland Kitchen Marital Status:     Family History  Problem Relation Age of Onset  . Coronary artery disease Father   . Depression Father   . Heart attack Father   . Depression Mother     Health Maintenance  Topic Date Due  . OPHTHALMOLOGY EXAM  03/18/2019  . INFLUENZA VACCINE  04/08/2019 (Originally  08/09/2018)  . PNA vac Low Risk Adult (1 of 2 - PCV13) 10/21/2019 (Originally 10/10/2013)  . HEMOGLOBIN A1C  06/22/2019  . FOOT EXAM  10/21/2019  . COLONOSCOPY  12/28/2025  . TETANUS/TDAP  12/08/2026  . Hepatitis C Screening  Completed     ----------------------------------------------------------------------------------------------------------------------------------------------------------------------------------------------------------------- Physical Exam BP 134/81   Pulse 74   Ht 5\' 9"  (1.753 m)   Wt 207 lb (93.9 kg)   BMI 30.57 kg/m   Physical Exam Constitutional:      Appearance: Normal appearance.  HENT:     Head: Normocephalic and atraumatic.     Mouth/Throat:     Mouth: Mucous membranes are moist.  Eyes:     General: No scleral icterus. Cardiovascular:     Rate and Rhythm: Normal rate and regular rhythm.  Pulmonary:     Effort: Pulmonary effort is normal.     Breath sounds: Normal breath sounds.  Abdominal:     General: Abdomen is flat.     Palpations: Abdomen is soft.     Hernia: A hernia (R umbilical) is present.  Neurological:     General: No focal deficit present.     Mental Status: He is alert.  Psychiatric:        Mood and Affect: Mood normal.        Behavior: Behavior normal.      ------------------------------------------------------------------------------------------------------------------------------------------------------------------------------------------------------------------- Assessment and Plan  Chronic constipation -Likely multiple factors including chronic opioid use, antidepressant medication as well as poor diet.   -He had significant cramping and abdominal pain yesterday, likely related to senokot overuse.  This has improved today.  -Will have him change to miralax 1-2 scoops per day in gatorade. Discussed that it may take a few days to have bowel movement with this.  -Can consider addition of linzess if this continues.    Insomnia Chronic, multifactorial with underlying psychiatric illnesses and poor sleep hygiene.   Will reduce mirtazapine to 7.5mg  as this is likely to have more sedating effects at lower dose.     Meds ordered this encounter  Medications  . mirtazapine (REMERON) 7.5 MG tablet    Sig: Take 1 tablet (7.5 mg total) by mouth at bedtime.    Dispense:  30 tablet    Refill:  0  . polyethylene glycol powder (GLYCOLAX/MIRALAX) 17 GM/SCOOP powder    Sig: Take 17 g by mouth 2 (two) times daily as needed.    Dispense:  3350 g    Refill:  1    No follow-ups on file.    This visit occurred during the SARS-CoV-2 public health emergency.  Safety protocols were in place, including screening questions prior to the visit, additional usage of staff PPE, and extensive cleaning of exam room while observing appropriate contact time as indicated for disinfecting solutions.

## 2019-03-24 NOTE — Assessment & Plan Note (Addendum)
-  Likely multiple factors including chronic opioid use, antidepressant medication as well as poor diet.   -He had significant cramping and abdominal pain yesterday, likely related to senokot overuse.  This has improved today.  -Will have him change to miralax 1-2 scoops per day in gatorade. Discussed that it may take a few days to have bowel movement with this.  -Can consider addition of linzess if this continues.

## 2019-03-26 ENCOUNTER — Encounter: Payer: Self-pay | Admitting: Family Medicine

## 2019-03-26 ENCOUNTER — Other Ambulatory Visit: Payer: Self-pay

## 2019-03-26 ENCOUNTER — Ambulatory Visit: Payer: Medicare Other | Admitting: Family Medicine

## 2019-03-26 ENCOUNTER — Ambulatory Visit (INDEPENDENT_AMBULATORY_CARE_PROVIDER_SITE_OTHER): Payer: Medicare Other | Admitting: Family Medicine

## 2019-03-26 VITALS — BP 107/69 | HR 67 | Temp 97.5°F | Ht 69.0 in | Wt 209.0 lb

## 2019-03-26 DIAGNOSIS — J439 Emphysema, unspecified: Secondary | ICD-10-CM

## 2019-03-26 DIAGNOSIS — E119 Type 2 diabetes mellitus without complications: Secondary | ICD-10-CM

## 2019-03-26 DIAGNOSIS — I2510 Atherosclerotic heart disease of native coronary artery without angina pectoris: Secondary | ICD-10-CM

## 2019-03-26 DIAGNOSIS — K5909 Other constipation: Secondary | ICD-10-CM | POA: Diagnosis not present

## 2019-03-26 DIAGNOSIS — F99 Mental disorder, not otherwise specified: Secondary | ICD-10-CM

## 2019-03-26 DIAGNOSIS — Z720 Tobacco use: Secondary | ICD-10-CM

## 2019-03-26 DIAGNOSIS — M48062 Spinal stenosis, lumbar region with neurogenic claudication: Secondary | ICD-10-CM

## 2019-03-26 DIAGNOSIS — F5105 Insomnia due to other mental disorder: Secondary | ICD-10-CM | POA: Diagnosis not present

## 2019-03-26 LAB — POCT GLYCOSYLATED HEMOGLOBIN (HGB A1C): Hemoglobin A1C: 7.6 % — AB (ref 4.0–5.6)

## 2019-03-26 NOTE — Patient Instructions (Signed)
Great to see you today! Your a1c has improved from 8.6% to 7.6% today. Please continue current medications.  Follow up with me in 3 months or sooner if needed.

## 2019-03-26 NOTE — Progress Notes (Signed)
[name] - 69 y.o. male MRN [id number]  Date of birth: October 23, 1950  Subjective No chief complaint on file.   Anthony [name]. is a 69 y.o. male here today for follow up visit.  -Constipation:  History of chronic constipation with worsening constipation recently.  Seen a couple of days ago, recommended miralax in gatorade.  He reports that he had a large bowel movement this morning and is feeling better.   -T2DM:  Current treatment with metformin and glipizide.  He does not check blood sugars very often but when he has he reports that readings have been "good".  He denies symptoms of hypoglycemia.    -HTN/CAD:  Treated with lisinopril and toprol-xl.  He is taking medications as directed. BP has been well controlled with this medication.  He denies symptoms of hypotension.  He has not had chest pain, shortness of breath, palpitations, headache or vision changes.   -Depression with insomnia:  He has struggled with both depression and insomnia.  Mitrazapine recently reduced to 7.5mg  nightly to help more with sleep.  He has tried several other medications for this as well.  He reports stable symptoms at this time.    -COPD:  Using spiriva daily with albuterol as needed.  He also has duoneb as needed.  He does unfortunately continue to smoke.  No interest in quitting right now.    -Chronic hip/back pain:  Current management with tramadol and gabapentin.  He also has baclofen as needed.  Fairly well controlled with current medications.  Denies any new or worsening pain at this time.   ROS:  A comprehensive ROS was completed and negative except as noted per HPI   Allergies  Allergen Reactions  . Flomax [Tamsulosin Hcl] Other (See Comments)    This medication makes patient feel sick  . Penicillins Other (See Comments)    Reaction unknown occurred during childhood Has patient had a PCN reaction causing immediate rash, facial/tongue/throat swelling, SOB or lightheadedness with  hypotension: unsure - childhood reaction Has patient had a PCN reaction causing severe rash involving mucus membranes or skin necrosis: unsure - childhood reaction Has patient had a PCN reaction that required hospitalization no Has patient had a PCN reaction occurring within the last 10 years: no If all of the above answers are "NO", then may p    Past Medical History:  Diagnosis Date  . Anxiety   . Arthritis   . Bipolar disorder ([place])   . CAD (coronary artery disease)    a. INF STEMI 07/04/10:  tx with thrombectomy + [facility] to [facility];  cath 07/04/10: dLM 10-20%, pLAD 40-50%, mLAD 20-30%, pRCA 30%, mRCA occluded and tx with PCI, EF 50% with inf HK. A Multilink  . COPD (chronic obstructive pulmonary disease) ([place])   . Depression   . Glucose intolerance (impaired glucose tolerance)    A1c 6.2 06/2010  . History of DVT (deep vein thrombosis)    traumatic, s/p coumadin tx.  Marland Kitchen HLD (hyperlipidemia)   . Hypertension    pt states is currently on no medications  . Myocardial infarction ([place])    around 2013  . Shortness of breath    walking distance or climbing stairs  . Tobacco abuse   . Urinary frequency   . Vitamin D deficiency 09/05/2018    Past Surgical History:  Procedure Laterality Date  . CARDIAC CATHETERIZATION    . COLONOSCOPY N/A 12/29/2015   Procedure: COLONOSCOPY;  Surgeon: Rogene Houston, MD;  [facility];  Service: Endoscopy;  Laterality: N/A;  12:55  . ELECTROCARDIOGRAM     Showed inferolateral ST-elevation and a code STEMI was activated. In the ER, he was treated with morphine, herarin, and 600 mg of Plavix. He was transferred emergently to [facility].   . INGUINAL HERNIA REPAIR Left 07/26/2014   Procedure: OPEN REPAIR OF RECURENT LEFT INGUINAL HERNIA REPAIR WITH MESH;  Surgeon: Greer Pickerel, MD;  [facility];  Service: General;  Laterality: Left;  . INSERTION OF MESH Bilateral 10/30/2013   Procedure: INSERTION OF MESH;  Surgeon: Gayland Curry,  MD;  [facility];  Service: General;  Laterality: Bilateral;  . LAPAROSCOPIC INGUINAL HERNIA WITH UMBILICAL HERNIA Bilateral 10/30/2013   Procedure: laparoscopic repair left pantaloom hernia with mesh, laparoscopic right inguinal hernia with mesh, OPEN REPAIR OF UMBILICAL HERNIA ;  Surgeon: Gayland Curry, MD;  [facility];  Service: General;  Laterality: Bilateral;  . LAPAROSCOPY N/A 07/26/2014   Procedure: LAPAROSCOPY DIAGNOSTIC;  Surgeon: Greer Pickerel, MD;  [facility];  Service: General;  Laterality: N/A;  . POLYPECTOMY  12/29/2015   Procedure: POLYPECTOMY;  Surgeon: Rogene Houston, MD;  [facility];  Service: Endoscopy;;  ascending colon, descending colon  . stent placement    . TOTAL HIP ARTHROPLASTY Right 05/10/2015   Procedure: RIGHT TOTAL HIP ARTHROPLASTY ANTERIOR APPROACH;  Surgeon: Paralee Cancel, MD;  [facility];  Service: Orthopedics;  Laterality: Right;  Marland Kitchen VASECTOMY      Social History   Socioeconomic History  . Marital status: Divorced    Spouse name: Not on file  . Number of children: Not on file  . Years of education: Not on file  . Highest education level: Not on file  Occupational History  . Not on file  Tobacco Use  . Smoking status: Light Tobacco Smoker    Packs/day: 1.50    Years: 45.00    Pack years: 67.50    Types: Cigarettes  . Smokeless tobacco: Never Used  Substance and Sexual Activity  . Alcohol use: No    Comment: past hx of ETOH use was in inpt facility per court order  - 20 years, 10-18-2015 per pt not anymore,per pt stopped about 15-20 yrs ago  . Drug use: No    Types: Marijuana, Heroin, Cocaine    Comment: snorted heroin and cocaine, 10-18-2015 per pt in the past he did Marijuana only and nothing else  . Sexual activity: Not Currently    Partners: Female  Other Topics Concern  . Not on file  Social History Narrative   Lives in [place]. He lives alone. He has kids but is not married.    Social Determinants of Health    Financial Resource Strain:   . Difficulty of Paying Living Expenses:   Food Insecurity:   . Worried About Charity fundraiser in the Last Year:   . Arboriculturist in the Last Year:   Transportation Needs:   . Film/video editor (Medical):   Marland Kitchen Lack of Transportation (Non-Medical):   Physical Activity:   . Days of Exercise per Week:   . Minutes of Exercise per Session:   Stress:   . Feeling of Stress :   Social Connections:   . Frequency of Communication with Friends and Family:   . Frequency of Social Gatherings with Friends and Family:   . Attends Religious Services:   . Active Member of Clubs or  Organizations:   . Attends Archivist Meetings:   Marland Kitchen Marital Status:     Family History  Problem Relation Age of Onset  . Coronary artery disease Father   . Depression Father   . Heart attack Father   . Depression Mother     Health Maintenance  Topic Date Due  . OPHTHALMOLOGY EXAM  03/18/2019  . INFLUENZA VACCINE  04/08/2019 (Originally 08/09/2018)  . PNA vac Low Risk Adult (1 of 2 - PCV13) 10/21/2019 (Originally 10/10/2013)  . HEMOGLOBIN A1C  09/26/2019  . FOOT EXAM  10/21/2019  . COLONOSCOPY  12/28/2025  . TETANUS/TDAP  12/08/2026  . Hepatitis C Screening  Completed     ----------------------------------------------------------------------------------------------------------------------------------------------------------------------------------------------------------------- Physical Exam BP 107/69   Pulse 67   Temp (!) 97.5 F (36.4 C) (Oral)   Ht 5\' 9"  (1.753 m)   Wt 209 lb (94.8 kg)   BMI 30.86 kg/m   Physical Exam Constitutional:      Appearance: Normal appearance.  HENT:     Head: Normocephalic and atraumatic.  Eyes:     General: No scleral icterus. Cardiovascular:     Rate and Rhythm: Normal rate and regular rhythm.  Pulmonary:     Effort: Pulmonary effort is normal.     Breath sounds: Normal breath sounds.  Musculoskeletal:      Cervical back: Neck supple.  Skin:    General: Skin is warm and dry.  Neurological:     General: No focal deficit present.     Mental Status: He is alert.  Psychiatric:        Mood and Affect: Mood normal.        Behavior: Behavior normal.     ------------------------------------------------------------------------------------------------------------------------------------------------------------------------------------------------------------------- Assessment and Plan  Chronic constipation Constipation improved.  He will continue miralax as needed.    Diabetes mellitus without complication ([place]) Most recent A1c of  Lab Results  Component Value Date   HGBA1C 7.6 (A) 03/26/2019   indicates diabetes is improved today.  He  will continue current medications.  Counseled on healthy, low carb diet and recommend frequent activity to help with maintaining good control of blood sugars.    Tobacco abuse Counseled to quit, declines medication to help with this.   Insomnia Continued insomnia, improved some with reduction in mirtazapine dose.  Continue at current dose.  Stressed importance of sleep hygiene.   Spinal stenosis of lumbar region with neurogenic claudication Chronic pain.  Managed with combination of tramadol and gabapentin.  Stable with current medications.    CAD (coronary artery disease) Denies anginal symptoms at this time.  BP remains well controlled with current medications, continue at current strength.  Stressed importance of smoking cessation.   COPD (chronic obstructive pulmonary disease) Stable symptoms with current inhalers.    No orders of the defined types were placed in this encounter.   Return in about 3 months (around 06/26/2019) for HTN/DM.    This visit occurred during the SARS-CoV-2 public health emergency.  Safety protocols were in place, including screening questions prior to the visit, additional usage of staff PPE, and extensive cleaning of  exam room while observing appropriate contact time as indicated for disinfecting solutions.

## 2019-03-29 ENCOUNTER — Encounter: Payer: Self-pay | Admitting: Family Medicine

## 2019-03-29 NOTE — Assessment & Plan Note (Signed)
Chronic pain.  Managed with combination of tramadol and gabapentin.  Stable with current medications.

## 2019-03-29 NOTE — Assessment & Plan Note (Signed)
Counseled to quit, declines medication to help with this.

## 2019-03-29 NOTE — Assessment & Plan Note (Signed)
Stable symptoms with current inhalers.

## 2019-03-29 NOTE — Assessment & Plan Note (Signed)
Denies anginal symptoms at this time.  BP remains well controlled with current medications, continue at current strength.  Stressed importance of smoking cessation.

## 2019-03-29 NOTE — Assessment & Plan Note (Signed)
Continued insomnia, improved some with reduction in mirtazapine dose.  Continue at current dose.  Stressed importance of sleep hygiene.

## 2019-03-29 NOTE — Assessment & Plan Note (Signed)
Most recent A1c of  Lab Results  Component Value Date   HGBA1C 7.6 (A) 03/26/2019   indicates diabetes is improved today.  He  will continue current medications.  Counseled on healthy, low carb diet and recommend frequent activity to help with maintaining good control of blood sugars.

## 2019-03-29 NOTE — Assessment & Plan Note (Signed)
Constipation improved.  He will continue miralax as needed.

## 2019-04-01 ENCOUNTER — Other Ambulatory Visit: Payer: Self-pay

## 2019-04-01 NOTE — Patient Outreach (Signed)
[facility]) Care Management  04/01/2019  Saish Weatherwax 29-May-1950 [id number]   Medication Adherence call to Mr. Wallis Lafoon [facility] spoke with patient he is past due on Metformin Er 500 mg,patient explain he is taking 1 tablet 2 times a day,he explain when he started receiving his medication thru Mail order [name] he end up with extras,patient said he has medication at this time.   [place] Management Direct Dial [phone]  Fax [phone] [email or web address]

## 2019-04-07 ENCOUNTER — Telehealth: Payer: Self-pay

## 2019-04-07 NOTE — Telephone Encounter (Signed)
Anthony [name] called and reports the Mirtazapine is not working well. He states he is only sleeping an hour. Please advise.   Past medications Doxepin Hydroxyzine Ramelteon  Temazepam Trazodone Zolpidem

## 2019-04-09 NOTE — Telephone Encounter (Signed)
Please have him come in to discuss options w/ me

## 2019-04-10 ENCOUNTER — Other Ambulatory Visit: Payer: Self-pay | Admitting: Osteopathic Medicine

## 2019-04-10 DIAGNOSIS — M791 Myalgia, unspecified site: Secondary | ICD-10-CM

## 2019-04-13 ENCOUNTER — Ambulatory Visit: Payer: Medicare Other | Admitting: Nurse Practitioner

## 2019-04-13 NOTE — Telephone Encounter (Signed)
Patient did not want to wait for PCP to come back next week and he reports that he has not had but a few hours of sleep in the past week. He reports that he wants a "miracle drug".

## 2019-04-20 ENCOUNTER — Encounter: Payer: Self-pay | Admitting: Family Medicine

## 2019-04-20 ENCOUNTER — Ambulatory Visit (INDEPENDENT_AMBULATORY_CARE_PROVIDER_SITE_OTHER): Payer: Medicare Other | Admitting: Family Medicine

## 2019-04-20 ENCOUNTER — Other Ambulatory Visit: Payer: Self-pay

## 2019-04-20 DIAGNOSIS — F5105 Insomnia due to other mental disorder: Secondary | ICD-10-CM

## 2019-04-20 DIAGNOSIS — F99 Mental disorder, not otherwise specified: Secondary | ICD-10-CM

## 2019-04-20 MED ORDER — LEMBOREXANT 5 MG PO TABS: 5.0000 mg | ORAL_TABLET | Freq: Every day | ORAL | 2 refills | Status: DC

## 2019-04-20 NOTE — Progress Notes (Signed)
[name] - 69 y.o. male MRN [id number]  Date of birth: 1950/05/25  Subjective Chief Complaint  Patient presents with  . Follow-up    HPI Anthony [name]. is a 69 y.o. with history of insomnia here today to discuss continued sleep problems.  He has tried several medications for sleep including doxepin, vistaril, ramelteion, temazepam, trazodone, eszopiclone, belsomra, zolpidem and most recently mirtazapine.  He has only mild improvement or no improvement with each of these.  He has trouble staying asleep once he gets to sleep.  He still continues to have poor sleep hygiene and naps during the day.  Often stays up late and will not get up until around 1pm on some days.    ROS:  A comprehensive ROS was completed and negative except as noted per HPI  Allergies  Allergen Reactions  . Flomax [Tamsulosin Hcl] Other (See Comments)    This medication makes patient feel sick  . Penicillins Other (See Comments)    Reaction unknown occurred during childhood Has patient had a PCN reaction causing immediate rash, facial/tongue/throat swelling, SOB or lightheadedness with hypotension: unsure - childhood reaction Has patient had a PCN reaction causing severe rash involving mucus membranes or skin necrosis: unsure - childhood reaction Has patient had a PCN reaction that required hospitalization no Has patient had a PCN reaction occurring within the last 10 years: no If all of the above answers are "NO", then may p    Past Medical History:  Diagnosis Date  . Anxiety   . Arthritis   . Bipolar disorder ([place])   . CAD (coronary artery disease)    a. INF STEMI 07/04/10:  tx with thrombectomy + [facility] BMS to [facility];  cath 07/04/10: dLM 10-20%, pLAD 40-50%, mLAD 20-30%, pRCA 30%, mRCA occluded and tx with PCI, EF 50% with inf HK. A Multilink  . COPD (chronic obstructive pulmonary disease) ([place])   . Depression   . Glucose intolerance (impaired glucose tolerance)    A1c 6.2 06/2010  . History of DVT  (deep vein thrombosis)    traumatic, s/p coumadin tx.  Marland Kitchen HLD (hyperlipidemia)   . Hypertension    pt states is currently on no medications  . Myocardial infarction ([place])    around 2013  . Shortness of breath    walking distance or climbing stairs  . Tobacco abuse   . Urinary frequency   . Vitamin D deficiency 09/05/2018    Past Surgical History:  Procedure Laterality Date  . CARDIAC CATHETERIZATION    . COLONOSCOPY N/A 12/29/2015   Procedure: COLONOSCOPY;  Surgeon: Rogene Houston, MD;  [facility];  Service: Endoscopy;  Laterality: N/A;  12:55  . ELECTROCARDIOGRAM     Showed inferolateral ST-elevation and a code STEMI was activated. In the ER, he was treated with morphine, herarin, and 600 mg of Plavix. He was transferred emergently to [facility].   . INGUINAL HERNIA REPAIR Left 07/26/2014   Procedure: OPEN REPAIR OF RECURENT LEFT INGUINAL HERNIA REPAIR WITH MESH;  Surgeon: Greer Pickerel, MD;  [facility];  Service: General;  Laterality: Left;  . INSERTION OF MESH Bilateral 10/30/2013   Procedure: INSERTION OF MESH;  Surgeon: Gayland Curry, MD;  [facility];  Service: General;  Laterality: Bilateral;  . LAPAROSCOPIC INGUINAL HERNIA WITH UMBILICAL HERNIA Bilateral 10/30/2013   Procedure: laparoscopic repair left pantaloom hernia with mesh, laparoscopic right inguinal hernia with mesh, OPEN REPAIR OF UMBILICAL HERNIA ;  Surgeon:  Gayland Curry, MD;  [facility];  Service: General;  Laterality: Bilateral;  . LAPAROSCOPY N/A 07/26/2014   Procedure: LAPAROSCOPY DIAGNOSTIC;  Surgeon: Greer Pickerel, MD;  [facility];  Service: General;  Laterality: N/A;  . POLYPECTOMY  12/29/2015   Procedure: POLYPECTOMY;  Surgeon: Rogene Houston, MD;  [facility];  Service: Endoscopy;;  ascending colon, descending colon  . stent placement    . TOTAL HIP ARTHROPLASTY Right 05/10/2015   Procedure: RIGHT TOTAL HIP ARTHROPLASTY ANTERIOR APPROACH;  Surgeon: Paralee Cancel, MD;  [facility];  Service: Orthopedics;  Laterality: Right;  Marland Kitchen VASECTOMY      Social History   Socioeconomic History  . Marital status: Divorced    Spouse name: Not on file  . Number of children: Not on file  . Years of education: Not on file  . Highest education level: Not on file  Occupational History  . Not on file  Tobacco Use  . Smoking status: Light Tobacco Smoker    Packs/day: 1.50    Years: 45.00    Pack years: 67.50    Types: Cigarettes  . Smokeless tobacco: Never Used  Substance and Sexual Activity  . Alcohol use: No    Comment: past hx of ETOH use was in inpt facility per court order  - 20 years, 10-18-2015 per pt not anymore,per pt stopped about 15-20 yrs ago  . Drug use: No    Types: Marijuana, Heroin, Cocaine    Comment: snorted heroin and cocaine, 10-18-2015 per pt in the past he did Marijuana only and nothing else  . Sexual activity: Not Currently    Partners: Female  Other Topics Concern  . Not on file  Social History Narrative   Lives in [place]. He lives alone. He has kids but is not married.    Social Determinants of Health   Financial Resource Strain:   . Difficulty of Paying Living Expenses:   Food Insecurity:   . Worried About Charity fundraiser in the Last Year:   . Arboriculturist in the Last Year:   Transportation Needs:   . Film/video editor (Medical):   Marland Kitchen Lack of Transportation (Non-Medical):   Physical Activity:   . Days of Exercise per Week:   . Minutes of Exercise per Session:   Stress:   . Feeling of Stress :   Social Connections:   . Frequency of Communication with Friends and Family:   . Frequency of Social Gatherings with Friends and Family:   . Attends Religious Services:   . Active Member of Clubs or Organizations:   . Attends Archivist Meetings:   Marland Kitchen Marital Status:     Family History  Problem Relation Age of Onset  . Coronary artery disease Father   . Depression Father   . Heart attack  Father   . Depression Mother     Health Maintenance  Topic Date Due  . OPHTHALMOLOGY EXAM  03/18/2019  . PNA vac Low Risk Adult (1 of 2 - PCV13) 10/21/2019 (Originally 10/10/2013)  . INFLUENZA VACCINE  08/09/2019  . HEMOGLOBIN A1C  09/26/2019  . FOOT EXAM  10/21/2019  . COLONOSCOPY  12/28/2025  . TETANUS/TDAP  12/08/2026  . Hepatitis C Screening  Completed     ----------------------------------------------------------------------------------------------------------------------------------------------------------------------------------------------------------------- Physical Exam BP 115/64   Pulse 91   Ht 5\' 9"  (1.753 m)   Wt 209 lb (94.8 kg)   BMI 30.86 kg/m   Physical Exam  Constitutional:      Appearance: Normal appearance.  HENT:     Head: Normocephalic and atraumatic.     Mouth/Throat:     Mouth: Mucous membranes are moist.  Eyes:     General: No scleral icterus. Cardiovascular:     Rate and Rhythm: Normal rate and regular rhythm.  Pulmonary:     Effort: Pulmonary effort is normal.     Breath sounds: Normal breath sounds.  Musculoskeletal:     Cervical back: Neck supple.  Skin:    General: Skin is warm and dry.  Neurological:     General: No focal deficit present.     Mental Status: He is alert.  Psychiatric:        Mood and Affect: Mood normal.     ------------------------------------------------------------------------------------------------------------------------------------------------------------------------------------------------------------------- Assessment and Plan  Insomnia He has tried and failed several medications for management of insomnia. Stressed importance of sleep hygiene and maintaining normal schedule We have exhausted nearly all medications at this point, will see if lemborexant can be approved for him.  We can also consider re-trying some of the medications he has had mild-moderate success with in the past.    Meds ordered this  encounter  Medications  . Lemborexant 5 MG TABS    Sig: Take 5 mg by mouth at bedtime.    Dispense:  30 tablet    Refill:  2    No follow-ups on file.    This visit occurred during the SARS-CoV-2 public health emergency.  Safety protocols were in place, including screening questions prior to the visit, additional usage of staff PPE, and extensive cleaning of exam room while observing appropriate contact time as indicated for disinfecting solutions.

## 2019-04-20 NOTE — Patient Instructions (Signed)
Let's try DayVigo(lemborexant) for sleep.  See me again in 1 month.

## 2019-04-21 ENCOUNTER — Telehealth: Payer: Self-pay | Admitting: Family Medicine

## 2019-04-21 NOTE — Telephone Encounter (Signed)
Received fax for PA on [place] sent through cover my meds waiting on determination. - CF

## 2019-04-24 NOTE — Telephone Encounter (Signed)
Patient is wanting to know if you can send something else for sleep. He has reported that [facility] and [facility] are charging him too much and he wants something for sleep. Do you have any other recommendations? Please advise.

## 2019-04-26 ENCOUNTER — Encounter: Payer: Self-pay | Admitting: Family Medicine

## 2019-04-26 NOTE — Assessment & Plan Note (Signed)
He has tried and failed several medications for management of insomnia. Stressed importance of sleep hygiene and maintaining normal schedule We have exhausted nearly all medications at this point, will see if lemborexant can be approved for him.  We can also consider re-trying some of the medications he has had mild-moderate success with in the past.

## 2019-04-27 NOTE — Telephone Encounter (Signed)
We could try something he has taken previously again such as temazepam.  Does he recall what has worked best for him in the past?

## 2019-04-28 NOTE — Telephone Encounter (Signed)
Called patient and was unable to leave a message

## 2019-05-01 ENCOUNTER — Other Ambulatory Visit: Payer: Self-pay | Admitting: Family Medicine

## 2019-05-01 MED ORDER — TEMAZEPAM 15 MG PO CAPS: 15.0000 mg | ORAL_CAPSULE | Freq: Every evening | ORAL | 3 refills | Status: DC | PRN

## 2019-05-01 NOTE — Telephone Encounter (Signed)
Temazepam sent in.

## 2019-05-01 NOTE — Telephone Encounter (Signed)
Patient is willing to try the Temazepam and please send to [facility]. He is not sure what dose he was on but he is willing to take this and help him get some type of sleep pattern. Please advise.

## 2019-05-18 ENCOUNTER — Ambulatory Visit (INDEPENDENT_AMBULATORY_CARE_PROVIDER_SITE_OTHER): Payer: Medicare Other | Admitting: Family Medicine

## 2019-05-18 ENCOUNTER — Encounter: Payer: Self-pay | Admitting: Family Medicine

## 2019-05-18 DIAGNOSIS — F99 Mental disorder, not otherwise specified: Secondary | ICD-10-CM | POA: Diagnosis not present

## 2019-05-18 DIAGNOSIS — F5105 Insomnia due to other mental disorder: Secondary | ICD-10-CM

## 2019-05-18 DIAGNOSIS — M48062 Spinal stenosis, lumbar region with neurogenic claudication: Secondary | ICD-10-CM | POA: Diagnosis not present

## 2019-05-18 MED ORDER — TEMAZEPAM 30 MG PO CAPS: 30.0000 mg | ORAL_CAPSULE | Freq: Every evening | ORAL | 2 refills | Status: DC | PRN

## 2019-05-18 MED ORDER — TRAZODONE HCL 50 MG PO TABS: 50.0000 mg | ORAL_TABLET | Freq: Every evening | ORAL | 1 refills | Status: DC | PRN

## 2019-05-18 NOTE — Progress Notes (Signed)
[name] - 69 y.o. male MRN [id number]  Date of birth: Jun 07, 1950  Subjective Chief Complaint  Patient presents with  . Follow-up    HPI Anthony [name]. is a 69 y.o. male here today for follow up of insomnia.  He was restarted on temazepam at last visit .  He has been taking up to 4/night to help with sleep.  This is working but I discussed with him that this is way too much.  He would be willing to add trazodone back on.  He denies side effects form current medication.    He also complains of increased neuropathy.  He is prescribed gabapentin TID but reports he is only taking once per day.    ROS:  A comprehensive ROS was completed and negative except as noted per HPI  Allergies  Allergen Reactions  . Flomax [Tamsulosin Hcl] Other (See Comments)    This medication makes patient feel sick  . Penicillins Other (See Comments)    Reaction unknown occurred during childhood Has patient had a PCN reaction causing immediate rash, facial/tongue/throat swelling, SOB or lightheadedness with hypotension: unsure - childhood reaction Has patient had a PCN reaction causing severe rash involving mucus membranes or skin necrosis: unsure - childhood reaction Has patient had a PCN reaction that required hospitalization no Has patient had a PCN reaction occurring within the last 10 years: no If all of the above answers are "NO", then may p    Past Medical History:  Diagnosis Date  . Anxiety   . Arthritis   . Bipolar disorder ([place])   . CAD (coronary artery disease)    a. INF STEMI 07/04/10:  tx with thrombectomy + [facility] BMS to [facility];  cath 07/04/10: dLM 10-20%, pLAD 40-50%, mLAD 20-30%, pRCA 30%, mRCA occluded and tx with PCI, EF 50% with inf HK. A Multilink  . COPD (chronic obstructive pulmonary disease) ([place])   . Depression   . Glucose intolerance (impaired glucose tolerance)    A1c 6.2 06/2010  . History of DVT (deep vein thrombosis)    traumatic, s/p coumadin tx.  Marland Kitchen HLD  (hyperlipidemia)   . Hypertension    pt states is currently on no medications  . Myocardial infarction ([place])    around 2013  . Shortness of breath    walking distance or climbing stairs  . Tobacco abuse   . Urinary frequency   . Vitamin D deficiency 09/05/2018    Past Surgical History:  Procedure Laterality Date  . CARDIAC CATHETERIZATION    . COLONOSCOPY N/A 12/29/2015   Procedure: COLONOSCOPY;  Surgeon: Rogene Houston, MD;  [facility];  Service: Endoscopy;  Laterality: N/A;  12:55  . ELECTROCARDIOGRAM     Showed inferolateral ST-elevation and a code STEMI was activated. In the ER, he was treated with morphine, herarin, and 600 mg of Plavix. He was transferred emergently to [facility].   . INGUINAL HERNIA REPAIR Left 07/26/2014   Procedure: OPEN REPAIR OF RECURENT LEFT INGUINAL HERNIA REPAIR WITH MESH;  Surgeon: Greer Pickerel, MD;  [facility];  Service: General;  Laterality: Left;  . INSERTION OF MESH Bilateral 10/30/2013   Procedure: INSERTION OF MESH;  Surgeon: Gayland Curry, MD;  [facility];  Service: General;  Laterality: Bilateral;  . LAPAROSCOPIC INGUINAL HERNIA WITH UMBILICAL HERNIA Bilateral 10/30/2013   Procedure: laparoscopic repair left pantaloom hernia with mesh, laparoscopic right inguinal hernia with mesh, OPEN REPAIR OF UMBILICAL HERNIA ;  Surgeon: Gayland Curry, MD;  [facility];  Service: General;  Laterality: Bilateral;  . LAPAROSCOPY N/A 07/26/2014   Procedure: LAPAROSCOPY DIAGNOSTIC;  Surgeon: Greer Pickerel, MD;  [facility];  Service: General;  Laterality: N/A;  . POLYPECTOMY  12/29/2015   Procedure: POLYPECTOMY;  Surgeon: Rogene Houston, MD;  [facility];  Service: Endoscopy;;  ascending colon, descending colon  . stent placement    . TOTAL HIP ARTHROPLASTY Right 05/10/2015   Procedure: RIGHT TOTAL HIP ARTHROPLASTY ANTERIOR APPROACH;  Surgeon: Paralee Cancel, MD;  [facility];  Service: Orthopedics;  Laterality:  Right;  Marland Kitchen VASECTOMY      Social History   Socioeconomic History  . Marital status: Divorced    Spouse name: Not on file  . Number of children: Not on file  . Years of education: Not on file  . Highest education level: Not on file  Occupational History  . Not on file  Tobacco Use  . Smoking status: Light Tobacco Smoker    Packs/day: 1.50    Years: 45.00    Pack years: 67.50    Types: Cigarettes  . Smokeless tobacco: Never Used  Substance and Sexual Activity  . Alcohol use: No    Comment: past hx of ETOH use was in inpt facility per court order  - 20 years, 10-18-2015 per pt not anymore,per pt stopped about 15-20 yrs ago  . Drug use: No    Types: Marijuana, Heroin, Cocaine    Comment: snorted heroin and cocaine, 10-18-2015 per pt in the past he did Marijuana only and nothing else  . Sexual activity: Not Currently    Partners: Female  Other Topics Concern  . Not on file  Social History Narrative   Lives in [place]. He lives alone. He has kids but is not married.    Social Determinants of Health   Financial Resource Strain:   . Difficulty of Paying Living Expenses:   Food Insecurity:   . Worried About Charity fundraiser in the Last Year:   . Arboriculturist in the Last Year:   Transportation Needs:   . Film/video editor (Medical):   Marland Kitchen Lack of Transportation (Non-Medical):   Physical Activity:   . Days of Exercise per Week:   . Minutes of Exercise per Session:   Stress:   . Feeling of Stress :   Social Connections:   . Frequency of Communication with Friends and Family:   . Frequency of Social Gatherings with Friends and Family:   . Attends Religious Services:   . Active Member of Clubs or Organizations:   . Attends Archivist Meetings:   Marland Kitchen Marital Status:     Family History  Problem Relation Age of Onset  . Coronary artery disease Father   . Depression Father   . Heart attack Father   . Depression Mother     Health Maintenance  Topic Date  Due  . COVID-19 Vaccine (1) Never done  . OPHTHALMOLOGY EXAM  03/18/2019  . PNA vac Low Risk Adult (1 of 2 - PCV13) 10/21/2019 (Originally 10/10/2013)  . INFLUENZA VACCINE  08/09/2019  . HEMOGLOBIN A1C  09/26/2019  . FOOT EXAM  10/21/2019  . COLONOSCOPY  12/28/2025  . TETANUS/TDAP  12/08/2026  . Hepatitis C Screening  Completed     ----------------------------------------------------------------------------------------------------------------------------------------------------------------------------------------------------------------- Physical Exam BP 135/86 (BP Location: Left Arm, Patient Position: Sitting, Cuff Size: Normal)   Pulse 77   Ht 5' 8.9" (  1.75 m)   Wt 216 lb 8 oz (98.2 kg)   SpO2 92%   BMI 32.07 kg/m   Physical Exam Constitutional:      Appearance: Normal appearance.  HENT:     Head: Normocephalic and atraumatic.  Eyes:     General: No scleral icterus. Cardiovascular:     Rate and Rhythm: Normal rate and regular rhythm.  Pulmonary:     Effort: Pulmonary effort is normal.     Breath sounds: Normal breath sounds.  Musculoskeletal:     Cervical back: Neck supple.  Neurological:     Mental Status: He is alert.  Psychiatric:        Mood and Affect: Mood normal.        Behavior: Behavior normal.     ------------------------------------------------------------------------------------------------------------------------------------------------------------------------------------------------------------------- Assessment and Plan  Insomnia Discussed that 60mg  of temazepam is way too high.  Ok to take 30mg  nightly, will add trazodone back on as well.  Follow up in about 6 weeks     Spinal stenosis of lumbar region with neurogenic claudication Discussed that he can take gabapentin up to TID   Meds ordered this encounter  Medications  . temazepam (RESTORIL) 30 MG capsule    Sig: Take 1 capsule (30 mg total) by mouth at bedtime as needed for sleep.     Dispense:  30 capsule    Refill:  2  . traZODone (DESYREL) 50 MG tablet    Sig: Take 1-2 tablets (50-100 mg total) by mouth at bedtime as needed for sleep.    Dispense:  90 tablet    Refill:  1    Return in about 6 weeks (around 06/29/2019) for Insomnia.    This visit occurred during the SARS-CoV-2 public health emergency.  Safety protocols were in place, including screening questions prior to the visit, additional usage of staff PPE, and extensive cleaning of exam room while observing appropriate contact time as indicated for disinfecting solutions.

## 2019-05-18 NOTE — Assessment & Plan Note (Signed)
Discussed that 60mg  of temazepam is way too high.  Ok to take 30mg  nightly, will add trazodone back on as well.  Follow up in about 6 weeks

## 2019-05-18 NOTE — Patient Instructions (Addendum)
Take gabapentin 800mg  three times per day Continue walking as much as possible.   Change temazepam to 30mg  nightly.  Add trazodone 50-100mg  at bedtime as well.   See me again in 6 weeks.   Lab Results  Component Value Date   HGBA1C 7.6 (A) 03/26/2019

## 2019-05-18 NOTE — Assessment & Plan Note (Signed)
Discussed that he can take gabapentin up to TID

## 2019-06-11 ENCOUNTER — Other Ambulatory Visit: Payer: Self-pay | Admitting: Osteopathic Medicine

## 2019-06-11 DIAGNOSIS — E1165 Type 2 diabetes mellitus with hyperglycemia: Secondary | ICD-10-CM

## 2019-06-26 ENCOUNTER — Ambulatory Visit: Payer: Medicare Other | Admitting: Osteopathic Medicine

## 2019-07-01 ENCOUNTER — Ambulatory Visit: Payer: Medicare Other | Admitting: Family Medicine

## 2019-07-03 ENCOUNTER — Other Ambulatory Visit: Payer: Self-pay | Admitting: Osteopathic Medicine

## 2019-07-06 ENCOUNTER — Other Ambulatory Visit: Payer: Self-pay

## 2019-07-06 MED ORDER — METOPROLOL SUCCINATE ER 25 MG PO TB24: 12.5000 mg | ORAL_TABLET | Freq: Every day | ORAL | 0 refills | Status: DC

## 2019-07-14 ENCOUNTER — Ambulatory Visit (INDEPENDENT_AMBULATORY_CARE_PROVIDER_SITE_OTHER): Payer: Medicare Other | Admitting: Family Medicine

## 2019-07-14 ENCOUNTER — Encounter: Payer: Self-pay | Admitting: Family Medicine

## 2019-07-14 VITALS — BP 113/66 | HR 68 | Ht 68.9 in | Wt 204.5 lb

## 2019-07-14 DIAGNOSIS — F99 Mental disorder, not otherwise specified: Secondary | ICD-10-CM

## 2019-07-14 DIAGNOSIS — F5105 Insomnia due to other mental disorder: Secondary | ICD-10-CM | POA: Diagnosis not present

## 2019-07-14 DIAGNOSIS — M25551 Pain in right hip: Secondary | ICD-10-CM | POA: Diagnosis not present

## 2019-07-14 DIAGNOSIS — M25561 Pain in right knee: Secondary | ICD-10-CM

## 2019-07-14 DIAGNOSIS — G8929 Other chronic pain: Secondary | ICD-10-CM

## 2019-07-14 NOTE — Progress Notes (Signed)
[name] - 69 y.o. male MRN [id number]  Date of birth: 07-Mar-1950  Subjective No chief complaint on file.   HPI Anthony [name]. is a 69 y.o. male here today to discuss hip and knee pain.  He is requesting referral to PT to help with this.  He has some relief with home stretches.  He does take tramadol and gabapentin which does offer some relief.   He also reports that he is sleeping much better with combination of trazodone and temazepam.  He denies any significant side effects related to this.    ROS:  A comprehensive ROS was completed and negative except as noted per HPI  Allergies  Allergen Reactions  . Flomax [Tamsulosin Hcl] Other (See Comments)    This medication makes patient feel sick  . Penicillins Other (See Comments)    Reaction unknown occurred during childhood Has patient had a PCN reaction causing immediate rash, facial/tongue/throat swelling, SOB or lightheadedness with hypotension: unsure - childhood reaction Has patient had a PCN reaction causing severe rash involving mucus membranes or skin necrosis: unsure - childhood reaction Has patient had a PCN reaction that required hospitalization no Has patient had a PCN reaction occurring within the last 10 years: no If all of the above answers are "NO", then may p    Past Medical History:  Diagnosis Date  . Anxiety   . Arthritis   . Bipolar disorder ([place])   . CAD (coronary artery disease)    a. INF STEMI 07/04/10:  tx with thrombectomy + [facility];  cath 07/04/10: dLM 10-20%, pLAD 40-50%, mLAD 20-30%, pRCA 30%, mRCA occluded and tx with PCI, EF 50% with inf HK. A Multilink  . COPD (chronic obstructive pulmonary disease) ([place])   . Depression   . Glucose intolerance (impaired glucose tolerance)    A1c 6.2 06/2010  . History of DVT (deep vein thrombosis)    traumatic, s/p coumadin tx.  Marland Kitchen HLD (hyperlipidemia)   . Hypertension    pt states is currently on no medications  . Myocardial infarction ([place])     around 2013  . Shortness of breath    walking distance or climbing stairs  . Tobacco abuse   . Urinary frequency   . Vitamin D deficiency 09/05/2018    Past Surgical History:  Procedure Laterality Date  . CARDIAC CATHETERIZATION    . COLONOSCOPY N/A 12/29/2015   Procedure: COLONOSCOPY;  Surgeon: Rogene Houston, MD;  [facility];  Service: Endoscopy;  Laterality: N/A;  12:55  . ELECTROCARDIOGRAM     Showed inferolateral ST-elevation and a code STEMI was activated. In the ER, he was treated with morphine, herarin, and 600 mg of Plavix. He was transferred emergently to [facility].   . INGUINAL HERNIA REPAIR Left 07/26/2014   Procedure: OPEN REPAIR OF RECURENT LEFT INGUINAL HERNIA REPAIR WITH MESH;  Surgeon: Greer Pickerel, MD;  [facility];  Service: General;  Laterality: Left;  . INSERTION OF MESH Bilateral 10/30/2013   Procedure: INSERTION OF MESH;  Surgeon: Gayland Curry, MD;  [facility];  Service: General;  Laterality: Bilateral;  . LAPAROSCOPIC INGUINAL HERNIA WITH UMBILICAL HERNIA Bilateral 10/30/2013   Procedure: laparoscopic repair left pantaloom hernia with mesh, laparoscopic right inguinal hernia with mesh, OPEN REPAIR OF UMBILICAL HERNIA ;  Surgeon: Gayland Curry, MD;  [facility];  Service: General;  Laterality: Bilateral;  . LAPAROSCOPY N/A 07/26/2014   Procedure: LAPAROSCOPY DIAGNOSTIC;  Surgeon: Greer Pickerel, MD;  [facility];  Service: General;  Laterality: N/A;  . POLYPECTOMY  12/29/2015   Procedure: POLYPECTOMY;  Surgeon: Rogene Houston, MD;  [facility];  Service: Endoscopy;;  ascending colon, descending colon  . stent placement    . TOTAL HIP ARTHROPLASTY Right 05/10/2015   Procedure: RIGHT TOTAL HIP ARTHROPLASTY ANTERIOR APPROACH;  Surgeon: Paralee Cancel, MD;  [facility];  Service: Orthopedics;  Laterality: Right;  Marland Kitchen VASECTOMY      Social History   Socioeconomic History  . Marital status: Divorced    Spouse  name: Not on file  . Number of children: Not on file  . Years of education: Not on file  . Highest education level: Not on file  Occupational History  . Not on file  Tobacco Use  . Smoking status: Light Tobacco Smoker    Packs/day: 1.50    Years: 45.00    Pack years: 67.50    Types: Cigarettes  . Smokeless tobacco: Never Used  Vaping Use  . Vaping Use: Never used  Substance and Sexual Activity  . Alcohol use: No    Comment: past hx of ETOH use was in inpt facility per court order  - 20 years, 10-18-2015 per pt not anymore,per pt stopped about 15-20 yrs ago  . Drug use: No    Types: Marijuana, Heroin, Cocaine    Comment: snorted heroin and cocaine, 10-18-2015 per pt in the past he did Marijuana only and nothing else  . Sexual activity: Not Currently    Partners: Female  Other Topics Concern  . Not on file  Social History Narrative   Lives in [place]. He lives alone. He has kids but is not married.    Social Determinants of Health   Financial Resource Strain:   . Difficulty of Paying Living Expenses:   Food Insecurity:   . Worried About Charity fundraiser in the Last Year:   . Arboriculturist in the Last Year:   Transportation Needs:   . Film/video editor (Medical):   Marland Kitchen Lack of Transportation (Non-Medical):   Physical Activity:   . Days of Exercise per Week:   . Minutes of Exercise per Session:   Stress:   . Feeling of Stress :   Social Connections:   . Frequency of Communication with Friends and Family:   . Frequency of Social Gatherings with Friends and Family:   . Attends Religious Services:   . Active Member of Clubs or Organizations:   . Attends Archivist Meetings:   Marland Kitchen Marital Status:     Family History  Problem Relation Age of Onset  . Coronary artery disease Father   . Depression Father   . Heart attack Father   . Depression Mother     Health Maintenance  Topic Date Due  . COVID-19 Vaccine (1) Never done  . OPHTHALMOLOGY EXAM   03/18/2019  . PNA vac Low Risk Adult (1 of 2 - PCV13) 10/21/2019 (Originally 10/10/2013)  . INFLUENZA VACCINE  08/09/2019  . HEMOGLOBIN A1C  09/26/2019  . FOOT EXAM  10/21/2019  . COLONOSCOPY  12/28/2025  . TETANUS/TDAP  12/08/2026  . Hepatitis C Screening  Completed     ----------------------------------------------------------------------------------------------------------------------------------------------------------------------------------------------------------------- Physical Exam BP 113/66 (BP Location: Left Arm, Patient Position: Sitting, Cuff Size: Large)   Pulse 68   Ht 5' 8.9" (1.75 m)   Wt 204 lb 8 oz (92.8 kg)   SpO2 93%  BMI 30.29 kg/m   Physical Exam Constitutional:      Appearance: Normal appearance.  HENT:     Head: Normocephalic and atraumatic.  Cardiovascular:     Rate and Rhythm: Normal rate and regular rhythm.  Pulmonary:     Effort: Pulmonary effort is normal.     Breath sounds: Normal breath sounds.  Musculoskeletal:     Cervical back: Neck supple.     Comments: Chronic swelling on the R leg.  No joint effusion noted.  ROM is fairly good. No instability.  Meniscal provocation testing negative.   Neurological:     General: No focal deficit present.     Mental Status: He is alert.  Psychiatric:        Mood and Affect: Mood normal.        Behavior: Behavior normal.     ------------------------------------------------------------------------------------------------------------------------------------------------------------------------------------------------------------------- Assessment and Plan  Chronic pain of right knee He would like to try PT for chronic hip and knee pain. Reasonable to try but if not improving may consider having him see Dr. Darene Lamer  Insomnia Improved with current medications of trazodone and temazepam, will continue at current dose.    No orders of the defined types were placed in this encounter.   No follow-ups on  file.    This visit occurred during the SARS-CoV-2 public health emergency.  Safety protocols were in place, including screening questions prior to the visit, additional usage of staff PPE, and extensive cleaning of exam room while observing appropriate contact time as indicated for disinfecting solutions.

## 2019-07-14 NOTE — Assessment & Plan Note (Signed)
He would like to try PT for chronic hip and knee pain. Reasonable to try but if not improving may consider having him see Dr. Darene Lamer

## 2019-07-14 NOTE — Assessment & Plan Note (Signed)
Improved with current medications of trazodone and temazepam, will continue at current dose.

## 2019-07-16 ENCOUNTER — Other Ambulatory Visit: Payer: Self-pay | Admitting: Physician Assistant

## 2019-07-16 DIAGNOSIS — E1165 Type 2 diabetes mellitus with hyperglycemia: Secondary | ICD-10-CM

## 2019-07-29 ENCOUNTER — Encounter: Payer: Self-pay | Admitting: Rehabilitative and Restorative Service Providers"

## 2019-07-29 ENCOUNTER — Other Ambulatory Visit: Payer: Self-pay

## 2019-07-29 ENCOUNTER — Ambulatory Visit (INDEPENDENT_AMBULATORY_CARE_PROVIDER_SITE_OTHER): Payer: Medicare Other | Admitting: Rehabilitative and Restorative Service Providers"

## 2019-07-29 DIAGNOSIS — M79604 Pain in right leg: Secondary | ICD-10-CM | POA: Diagnosis not present

## 2019-07-29 DIAGNOSIS — Z7409 Other reduced mobility: Secondary | ICD-10-CM | POA: Diagnosis not present

## 2019-07-29 DIAGNOSIS — M6281 Muscle weakness (generalized): Secondary | ICD-10-CM

## 2019-07-29 DIAGNOSIS — R29898 Other symptoms and signs involving the musculoskeletal system: Secondary | ICD-10-CM | POA: Diagnosis not present

## 2019-07-29 DIAGNOSIS — M79605 Pain in left leg: Secondary | ICD-10-CM | POA: Diagnosis not present

## 2019-07-29 NOTE — Patient Instructions (Signed)
Access Code: [email or web address]: [email or web address]: 07/21/2021Prepared by: Anntoinette Haefele HoltExercises  Sit to Stand - 2 x daily - 7 x weekly - 1 sets - 10 reps - 3-5 sec hold  Wall Quarter Squat - 2 x daily - 7 x weekly - 1-2 sets - 10 reps - 5-10 sec hold  Single Leg Stance with Support - 2 x daily - 7 x weekly - 1 sets - 3-5 reps - 30 sec hold

## 2019-07-29 NOTE — Therapy (Signed)
[place], [place], [address] Phone: [phone]   Fax:  [phone]  Physical Therapy Evaluation  Patient Details  Name: Anthony [name]. MRN: [id number] Date of Birth: June 28, 1950 Referring Provider (PT): Dr [name]   Encounter Date: 07/29/2019   PT End of Session - 07/29/19 1235    Visit Number 1    Number of Visits 12    Date for PT Re-Evaluation 09/09/19    PT Start Time 1106    PT Stop Time 1150    PT Time Calculation (min) 44 min    Activity Tolerance Patient tolerated treatment well           Past Medical History:  Diagnosis Date  . Anxiety   . Arthritis   . Bipolar disorder ([place])   . CAD (coronary artery disease)    a. INF STEMI 07/04/10:  tx with thrombectomy + [facility] BMS to [facility];  cath 07/04/10: dLM 10-20%, pLAD 40-50%, mLAD 20-30%, pRCA 30%, mRCA occluded and tx with PCI, EF 50% with inf HK. A Multilink  . COPD (chronic obstructive pulmonary disease) ([place])   . Depression   . Glucose intolerance (impaired glucose tolerance)    A1c 6.2 06/2010  . History of DVT (deep vein thrombosis)    traumatic, s/p coumadin tx.  Marland Kitchen HLD (hyperlipidemia)   . Hypertension    pt states is currently on no medications  . Myocardial infarction ([place])    around 2013  . Shortness of breath    walking distance or climbing stairs  . Tobacco abuse   . Urinary frequency   . Vitamin D deficiency 09/05/2018    Past Surgical History:  Procedure Laterality Date  . CARDIAC CATHETERIZATION    . COLONOSCOPY N/A 12/29/2015   Procedure: COLONOSCOPY;  Surgeon: Rogene Houston, MD;  [facility];  Service: Endoscopy;  Laterality: N/A;  12:55  . ELECTROCARDIOGRAM     Showed inferolateral ST-elevation and a code STEMI was activated. In the ER, he was treated with morphine, herarin, and 600 mg of Plavix. He was transferred emergently to [facility].   . INGUINAL HERNIA REPAIR Left  07/26/2014   Procedure: OPEN REPAIR OF RECURENT LEFT INGUINAL HERNIA REPAIR WITH MESH;  Surgeon: Greer Pickerel, MD;  [facility]: [facility];  Service: General;  Laterality: Left;  . INSERTION OF MESH Bilateral 10/30/2013   Procedure: INSERTION OF MESH;  Surgeon: Gayland Curry, MD;  [facility];  Service: General;  Laterality: Bilateral;  . LAPAROSCOPIC INGUINAL HERNIA WITH UMBILICAL HERNIA Bilateral 10/30/2013   Procedure: laparoscopic repair left pantaloom hernia with mesh, laparoscopic right inguinal hernia with mesh, OPEN REPAIR OF UMBILICAL HERNIA ;  Surgeon: Gayland Curry, MD;  [facility];  Service: General;  Laterality: Bilateral;  . LAPAROSCOPY N/A 07/26/2014   Procedure: LAPAROSCOPY DIAGNOSTIC;  Surgeon: Greer Pickerel, MD;  [facility];  Service: General;  Laterality: N/A;  . POLYPECTOMY  12/29/2015   Procedure: POLYPECTOMY;  Surgeon: Rogene Houston, MD;  [facility];  Service: Endoscopy;;  ascending colon, descending colon  . stent placement    . TOTAL HIP ARTHROPLASTY Right 05/10/2015   Procedure: RIGHT TOTAL HIP ARTHROPLASTY ANTERIOR APPROACH;  Surgeon: Paralee Cancel, MD;  [facility];  Service: Orthopedics;  Laterality: Right;  Marland Kitchen VASECTOMY      There were no vitals filed for this visit.    Subjective Assessment - 07/29/19 1108  Subjective Patient reports that he has difficulty with walking. He has decreased endurance for walking because of the pain in both legs - hips; knee; ankles. He has had increased pain in the past 4-5 months.    Pertinent History DVT Rt LE; arthritis; AODM; HTN; MI; hernia repair; depression    Patient Stated Goals get where he can be more mobile; walk more distance; have less pain    Currently in Pain? Yes    Pain Score 1     Pain Location Knee    Pain Orientation Right;Left    Pain Descriptors / Indicators Dull    Pain Type Chronic pain    Pain Radiating Towards in thighs and calves    Pain Onset More than a month ago    Pain  Frequency Intermittent    Aggravating Factors  standing; walking; worse with increased activity; increased later in the day    Pain Relieving Factors sleeping; meds; topical analgesic              [facility] PT Assessment - 07/29/19 0001      Assessment   Medical Diagnosis Rt > Lt LE pain - hips and knees     Referring Provider (PT) Dr [name]    Onset Date/Surgical Date 02/09/19    Hand Dominance Right    Next MD Visit no appt scheduled     Prior Therapy here for LBP       Precautions   Precautions None      Balance Screen   Has the patient fallen in the past 6 months Yes    How many times? fell off his bike  - no injury     Has the patient had a decrease in activity level because of a fear of falling?  No    Is the patient reluctant to leave their home because of a fear of falling?  No      Prior Function   Level of Independence Independent    Vocation Retired    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     Leisure some house repairs; walking daily ~ 1000- 2000 yards/day       Observation/Other Assessments   Observations SOB with minimal exertion - limited endurance for evaluation/exercise    Focus on Therapeutic Outcomes (FOTO)  50% limitation       Observation/Other Assessments-Edema    Edema --   Rt calf > Lt due to resolved DVT      Sensation   Additional Comments tingling in Lt UE - forearm and hand in the evenings       Posture/Postural Control   Posture Comments head forward; shoulders elevated and forward; flexed forward at hips       Strength   Right Hip Flexion 5/5    Right Hip Extension 4+/5    Right Hip ABduction 5/5    Left Hip Flexion 5/5    Left Hip Extension 4+/5    Left Hip ABduction 5/5    Right Knee Flexion 5/5    Right Knee Extension 5/5    Left Knee Flexion 5/5    Left Knee Extension 5/5    Right Ankle Dorsiflexion 5/5    Right Ankle Plantar Flexion 5/5    Left Ankle Dorsiflexion 5/5     Left Ankle Plantar Flexion 5/5      Flexibility   Hamstrings tight 70-75 deg bilat    Quadriceps tigth Rt >  Lt     ITB tight bilat     Piriformis WFL's       Ambulation/Gait   Pre-Gait Activities SLS Rt 7 sec; Lt 10 sec                       Objective measurements completed on examination: See above findings.       [facility] Adult PT Treatment/Exercise - 07/29/19 0001      Knee/Hip Exercises: Standing   Wall Squat 10 reps;10 seconds   1/4 squat    SLS 20 sec x 3 reps each LE UE support as needed for balance       Knee/Hip Exercises: Seated   Sit to Sand 10 reps;without UE support                  PT Education - 07/29/19 1137    Education Details HEP    Person(s) Educated Patient    Methods Explanation;Demonstration;Tactile cues;Verbal cues;Handout    Comprehension Verbalized understanding;Returned demonstration;Verbal cues required;Tactile cues required                       Plan - 07/29/19 1147    Clinical Impression Statement Anthony [name] presents with primary c/o decreased endurance for standing and walking with onset of Rt > Lt LE pain with physical activity. Symptoms are worse with incresaed activity and toward the end of the day. Patient has limited enducance and reports fatigue with activities of evaluation and wih exercises. He has weakness LE's; decreased balance for SLS; decresaed endurance with activities/exercises. Patient will benefit from trial of PT to address problems identified.    Stability/Clinical Decision Making Stable/Uncomplicated    Clinical Decision Making Low    Rehab Potential Good    PT Frequency 2x / week    PT Duration 6 weeks    PT Treatment/Interventions Patient/family education;ADLs/Self Care Home Management;Aquatic Therapy;Cryotherapy;Electrical Stimulation;Iontophoresis 4mg /ml Dexamethasone;Moist Heat;Ultrasound;Gait training;Stair training;Functional mobility training;Therapeutic activities;Therapeutic  exercise;Balance training;Neuromuscular re-education;Manual techniques;Dry needling;Taping    PT Next Visit Plan review HEP; progress with exercise for strengthening bilat LE's and improving endurance; assess endurance (? 6 min walking test)    PT Home Exercise Plan [email or web address]    Consulted and Agree with Plan of Care Patient           Patient will benefit from skilled therapeutic intervention in order to improve the following deficits and impairments:  Decreased endurance, Pain, Decreased strength, Decreased mobility, Postural dysfunction  Visit Diagnosis: Leg pain, bilateral - Plan: PT plan of care cert/re-cert  Muscle weakness (generalized) - Plan: PT plan of care cert/re-cert  Other symptoms and signs involving the musculoskeletal system - Plan: PT plan of care cert/re-cert  Decreased functional mobility and endurance - Plan: PT plan of care cert/re-cert     Problem List Patient Active Problem List   Diagnosis Date Noted  . Chronic pain of right knee 07/14/2019  . Chronic constipation 03/24/2019  . Spinal stenosis of lumbar region with neurogenic claudication 11/14/2018  . Vitamin D deficiency 09/05/2018  . Elevated vitamin B12 level 09/05/2018  . Acute neutrophilia 09/05/2018  . Chronic fatigue 08/29/2018  . Generalized weakness 08/29/2018  . Multiple lung nodules 08/03/2018  . Insomnia 03/26/2017  . Aortic atherosclerosis ([place]) 10/19/2016  . Pain medication agreement signed 06/05/2016  . Opioid dependence ([place]) 06/05/2016  . Diabetes mellitus without complication ([place]) 99/35/7017  . Obesity (BMI 30-39.9) 04/13/2016  . Rectal bleeding 11/23/2015  . Moderate episode of  recurrent major depressive disorder ([place]) 10/18/2015  . S/P right THA, AA 05/10/2015  . Recurrent left inguinal hernia 07/26/2014  . Chronic right hip pain 06/23/2014  . S/P bilateral inguinal hernia repair 10/30/2013  . Bilateral inguinal hernia 05/28/2013  . Umbilical hernia 20/94/7096  . Mood  disorder ([place])   . CAD (coronary artery disease)   . Hyperlipidemia associated with type 2 diabetes mellitus ([place])   . COPD (chronic obstructive pulmonary disease) ([place])   . Tobacco abuse     Anthony [name] Nilda Simmer PT, MPH  07/29/2019, 12:36 PM  [facility] [place], [place], [address] Phone: [phone]   Fax:  [phone]  Name: Anthony [name]. MRN: [id number] Date of Birth: 1950/11/29

## 2019-08-05 ENCOUNTER — Ambulatory Visit (INDEPENDENT_AMBULATORY_CARE_PROVIDER_SITE_OTHER): Payer: Medicare Other | Admitting: Physical Therapy

## 2019-08-05 ENCOUNTER — Encounter: Payer: Self-pay | Admitting: Physical Therapy

## 2019-08-05 ENCOUNTER — Other Ambulatory Visit: Payer: Self-pay

## 2019-08-05 DIAGNOSIS — M6281 Muscle weakness (generalized): Secondary | ICD-10-CM | POA: Diagnosis not present

## 2019-08-05 DIAGNOSIS — R29898 Other symptoms and signs involving the musculoskeletal system: Secondary | ICD-10-CM | POA: Diagnosis not present

## 2019-08-05 DIAGNOSIS — M79604 Pain in right leg: Secondary | ICD-10-CM | POA: Diagnosis not present

## 2019-08-05 DIAGNOSIS — M79605 Pain in left leg: Secondary | ICD-10-CM | POA: Diagnosis not present

## 2019-08-05 NOTE — Therapy (Signed)
[place], [place], [address] Phone: [phone]   Fax:  [phone]  Physical Therapy Treatment  Patient Details  Name: Anthony [name]. MRN: [id number] Date of Birth: [birth year] Referring Provider (PT): Dr [name]   Encounter Date: 08/05/2019   PT End of Session - 08/05/19 1050    Visit Number 2    Number of Visits 12    Date for PT Re-Evaluation 09/09/19    PT Start Time 1017    PT Stop Time 1050    PT Time Calculation (min) 33 min    Activity Tolerance Patient tolerated treatment well           Past Medical History:  Diagnosis Date  . Anxiety   . Arthritis   . Bipolar disorder ([place])   . CAD (coronary artery disease)    a. INF STEMI 07/04/10:  tx with thrombectomy + [facility] BMS to [facility];  cath 07/04/10: dLM 10-20%, pLAD 40-50%, mLAD 20-30%, pRCA 30%, mRCA occluded and tx with PCI, EF 50% with inf HK. A Multilink  . COPD (chronic obstructive pulmonary disease) ([place])   . Depression   . Glucose intolerance (impaired glucose tolerance)    A1c 6.2 06/2010  . History of DVT (deep vein thrombosis)    traumatic, s/p coumadin tx.  Marland Kitchen HLD (hyperlipidemia)   . Hypertension    pt states is currently on no medications  . Myocardial infarction ([place])    around 2013  . Shortness of breath    walking distance or climbing stairs  . Tobacco abuse   . Urinary frequency   . Vitamin D deficiency 09/05/2018    Past Surgical History:  Procedure Laterality Date  . CARDIAC CATHETERIZATION    . COLONOSCOPY N/A 12/29/2015   Procedure: COLONOSCOPY;  Surgeon: Rogene Houston, MD;  [facility];  Service: Endoscopy;  Laterality: N/A;  12:55  . ELECTROCARDIOGRAM     Showed inferolateral ST-elevation and a code STEMI was activated. In the ER, he was treated with morphine, herarin, and 600 mg of Plavix. He was transferred emergently to [facility].   . INGUINAL HERNIA REPAIR Left 07/26/2014    Procedure: OPEN REPAIR OF RECURENT LEFT INGUINAL HERNIA REPAIR WITH MESH;  Surgeon: Greer Pickerel, MD;  [facility];  Service: General;  Laterality: Left;  . INSERTION OF MESH Bilateral 10/30/2013   Procedure: INSERTION OF MESH;  Surgeon: Gayland Curry, MD;  [facility];  Service: General;  Laterality: Bilateral;  . LAPAROSCOPIC INGUINAL HERNIA WITH UMBILICAL HERNIA Bilateral 10/30/2013   Procedure: laparoscopic repair left pantaloom hernia with mesh, laparoscopic right inguinal hernia with mesh, OPEN REPAIR OF UMBILICAL HERNIA ;  Surgeon: Gayland Curry, MD;  [facility];  Service: General;  Laterality: Bilateral;  . LAPAROSCOPY N/A 07/26/2014   Procedure: LAPAROSCOPY DIAGNOSTIC;  Surgeon: Greer Pickerel, MD;  [facility];  Service: General;  Laterality: N/A;  . POLYPECTOMY  12/29/2015   Procedure: POLYPECTOMY;  Surgeon: Rogene Houston, MD;  [facility];  Service: Endoscopy;;  ascending colon, descending colon  . stent placement    . TOTAL HIP ARTHROPLASTY Right 05/10/2015   Procedure: RIGHT TOTAL HIP ARTHROPLASTY ANTERIOR APPROACH;  Surgeon: Paralee Cancel, MD;  [facility];  Service: Orthopedics;  Laterality: Right;  Marland Kitchen VASECTOMY      There were no vitals filed for this visit.   Subjective Assessment - 08/05/19 1024  Subjective Pt reports some improvement since last appt.  He notices if he walks slower his legs aren't as painful. He is walking more now.  One complaint is RLE is swelling at end of day.  The next morning RLE "feels great", but as day goes on, pain returns.    Patient Stated Goals get where he can be more mobile; walk more distance; have less pain    Currently in Pain? No/denies    Pain Score 0-No pain              [facility] PT Assessment - 08/05/19 0001      Assessment   Medical Diagnosis Rt > Lt LE pain - hips and knees     Referring Provider (PT) Dr [name]    Onset Date/Surgical Date 02/09/19    Hand Dominance Right    Next MD  Visit no appt scheduled     Prior Therapy here for LBP            [facility] Adult PT Treatment/Exercise - 08/05/19 0001      Knee/Hip Exercises: Stretches   Passive Hamstring Stretch Right;Left;2 reps;30 seconds    Piriformis Stretch Right;Left;2 reps;20 seconds   modified pigeon pose   Gastroc Stretch Both;2 reps;30 seconds      Knee/Hip Exercises: Aerobic   Nustep L5: legs only 6.5 min       Knee/Hip Exercises: Standing   Hip Abduction Right;Left;1 set;10 reps    Hip Extension Right;Left;1 set;10 reps    Wall Squat 1 set;10 reps;3 seconds   quarter wall squat   SLS R/L SLS x 15 sec x 2 reps, light UE support on counter      Knee/Hip Exercises: Seated   Sit to Sand 10 reps;without UE support             PT Education - 08/05/19 1048    Education Details updated HEP    Person(s) Educated Patient    Methods Explanation;Handout;Verbal cues;Demonstration    Comprehension Verbalized understanding;Returned demonstration             Plan - 08/05/19 1026    Clinical Impression Statement Pt reporting improvement in symptoms since initial visit, with increased tolerance for longer walks.  Pt tolerated LE strengthening exercises well; progressed HEP.  Progressing towards goals.    Rehab Potential Good    PT Frequency 2x / week    PT Duration 6 weeks    PT Treatment/Interventions Patient/family education;ADLs/Self Care Home Management;Aquatic Therapy;Cryotherapy;Electrical Stimulation;Iontophoresis 4mg /ml Dexamethasone;Moist Heat;Ultrasound;Gait training;Stair training;Functional mobility training;Therapeutic activities;Therapeutic exercise;Balance training;Neuromuscular re-education;Manual techniques;Dry needling;Taping    PT Next Visit Plan 6 min walk test; progress HEP as tolerated.    PT Home Exercise Plan [email or web address]           Patient will benefit from skilled therapeutic intervention in order to improve the following deficits and impairments:  Decreased endurance, Pain,  Decreased strength, Decreased mobility, Postural dysfunction  Visit Diagnosis: Leg pain, bilateral  Muscle weakness (generalized)  Other symptoms and signs involving the musculoskeletal system     Problem List Patient Active Problem List   Diagnosis Date Noted  . Chronic pain of right knee 07/14/2019  . Chronic constipation 03/24/2019  . Spinal stenosis of lumbar region with neurogenic claudication 11/14/2018  . Vitamin D deficiency 09/05/2018  . Elevated vitamin B12 level 09/05/2018  . Acute neutrophilia 09/05/2018  . Chronic fatigue 08/29/2018  . Generalized weakness 08/29/2018  . Multiple lung nodules 08/03/2018  . Insomnia 03/26/2017  .  Aortic atherosclerosis ([place]) 10/19/2016  . Pain medication agreement signed 06/05/2016  . Opioid dependence ([place]) 06/05/2016  . Diabetes mellitus without complication ([place]) 21/74/7159  . Obesity (BMI 30-39.9) 04/13/2016  . Rectal bleeding 11/23/2015  . Moderate episode of recurrent major depressive disorder ([place]) 10/18/2015  . S/P right THA, AA 05/10/2015  . Recurrent left inguinal hernia 07/26/2014  . Chronic right hip pain 06/23/2014  . S/P bilateral inguinal hernia repair 10/30/2013  . Bilateral inguinal hernia 05/28/2013  . Umbilical hernia 53/96/7289  . Mood disorder ([place])   . CAD (coronary artery disease)   . Hyperlipidemia associated with type 2 diabetes mellitus ([place])   . COPD (chronic obstructive pulmonary disease) ([place])   . Tobacco abuse    Kerin Perna, [place] 08/05/19 10:55 AM  [facility] [place], [place], [address] Phone: [phone]   Fax:  [phone]  Name: Stepfon Rawles. MRN: [id number] Date of Birth: 07/28/1950

## 2019-08-05 NOTE — Patient Instructions (Signed)
Access Code: [email or web address]: [email or web address]: 07/28/2021Prepared by: Crystal  Sit to Stand - 2 x daily - 7 x weekly - 1 sets - 10 reps - 3-5 sec hold  Wall Quarter Squat - 2 x daily - 7 x weekly - 1-2 sets - 10 reps - 5-10 sec hold  Single Leg Stance with Support - 2 x daily - 7 x weekly - 1 sets - 3 reps - 15 sec hold  Standing Hip Extension with Counter Support - 1 x daily - 7 x weekly - 2 sets - 10 reps  Standing Hip Abduction with Counter Support - 1 x daily - 7 x weekly - 2 sets - 10 reps  Seated Hamstring Stretch - 2 x daily - 7 x weekly - 1 sets - 3 reps - 20-30 seconds hold

## 2019-08-07 ENCOUNTER — Other Ambulatory Visit: Payer: Self-pay

## 2019-08-07 ENCOUNTER — Ambulatory Visit (INDEPENDENT_AMBULATORY_CARE_PROVIDER_SITE_OTHER): Payer: Medicare Other | Admitting: Rehabilitative and Restorative Service Providers"

## 2019-08-07 DIAGNOSIS — M6281 Muscle weakness (generalized): Secondary | ICD-10-CM

## 2019-08-07 DIAGNOSIS — Z7409 Other reduced mobility: Secondary | ICD-10-CM | POA: Diagnosis not present

## 2019-08-07 DIAGNOSIS — M79604 Pain in right leg: Secondary | ICD-10-CM | POA: Diagnosis not present

## 2019-08-07 DIAGNOSIS — R29898 Other symptoms and signs involving the musculoskeletal system: Secondary | ICD-10-CM | POA: Diagnosis not present

## 2019-08-07 DIAGNOSIS — M79605 Pain in left leg: Secondary | ICD-10-CM

## 2019-08-07 NOTE — Therapy (Signed)
[place], [place], [address] Phone: [phone]   Fax:  [phone]  Physical Therapy Treatment  Patient Details  Name: Anthony [name]. MRN: [id number] Date of Birth: 1950-08-07 Referring Provider (PT): Dr [name]   Encounter Date: 08/07/2019   PT End of Session - 08/07/19 1202    Visit Number 3    Number of Visits 12    Date for PT Re-Evaluation 09/09/19    PT Start Time 1150   pt late for appt   PT Stop Time 1236    PT Time Calculation (min) 46 min    Activity Tolerance Patient tolerated treatment well           Past Medical History:  Diagnosis Date  . Anxiety   . Arthritis   . Bipolar disorder ([place])   . CAD (coronary artery disease)    a. INF STEMI 07/04/10:  tx with thrombectomy + Vision BMS to [facility];  cath 07/04/10: dLM 10-20%, pLAD 40-50%, mLAD 20-30%, pRCA 30%, mRCA occluded and tx with PCI, EF 50% with inf HK. A Multilink  . COPD (chronic obstructive pulmonary disease) ([place])   . Depression   . Glucose intolerance (impaired glucose tolerance)    A1c 6.2 06/2010  . History of DVT (deep vein thrombosis)    traumatic, s/p coumadin tx.  Marland Kitchen HLD (hyperlipidemia)   . Hypertension    pt states is currently on no medications  . Myocardial infarction ([place])    around 2013  . Shortness of breath    walking distance or climbing stairs  . Tobacco abuse   . Urinary frequency   . Vitamin D deficiency 09/05/2018    Past Surgical History:  Procedure Laterality Date  . CARDIAC CATHETERIZATION    . COLONOSCOPY N/A 12/29/2015   Procedure: COLONOSCOPY;  Surgeon: Rogene Houston, MD;  [facility];  Service: Endoscopy;  Laterality: N/A;  12:55  . ELECTROCARDIOGRAM     Showed inferolateral ST-elevation and a code STEMI was activated. In the ER, he was treated with morphine, herarin, and 600 mg of Plavix. He was transferred emergently to [facility].   . INGUINAL HERNIA  REPAIR Left 07/26/2014   Procedure: OPEN REPAIR OF RECURENT LEFT INGUINAL HERNIA REPAIR WITH MESH;  Surgeon: Greer Pickerel, MD;  [facility];  Service: General;  Laterality: Left;  . INSERTION OF MESH Bilateral 10/30/2013   Procedure: INSERTION OF MESH;  Surgeon: Gayland Curry, MD;  [facility];  Service: General;  Laterality: Bilateral;  . LAPAROSCOPIC INGUINAL HERNIA WITH UMBILICAL HERNIA Bilateral 10/30/2013   Procedure: laparoscopic repair left pantaloom hernia with mesh, laparoscopic right inguinal hernia with mesh, OPEN REPAIR OF UMBILICAL HERNIA ;  Surgeon: Gayland Curry, MD;  [facility];  Service: General;  Laterality: Bilateral;  . LAPAROSCOPY N/A 07/26/2014   Procedure: LAPAROSCOPY DIAGNOSTIC;  Surgeon: Greer Pickerel, MD;  [facility];  Service: General;  Laterality: N/A;  . POLYPECTOMY  12/29/2015   Procedure: POLYPECTOMY;  Surgeon: Rogene Houston, MD;  [facility];  Service: Endoscopy;;  ascending colon, descending colon  . stent placement    . TOTAL HIP ARTHROPLASTY Right 05/10/2015   Procedure: RIGHT TOTAL HIP ARTHROPLASTY ANTERIOR APPROACH;  Surgeon: Paralee Cancel, MD;  [facility];  Service: Orthopedics;  Laterality: Right;  Marland Kitchen VASECTOMY      There were no vitals filed for this visit.   Subjective Assessment -  08/07/19 1203    Subjective Walking for increased time with fewer 10 sec rest breaks. Legs still hurt. Using Lidocane cream on legs and feet. Stil smoking - chain smoking.    Currently in Pain? No/denies                             [facility] Adult PT Treatment/Exercise - 08/07/19 0001      Knee/Hip Exercises: Stretches   Passive Hamstring Stretch Right;Left;2 reps;30 seconds   supine with strap    Piriformis Stretch Right;Left;2 reps;20 seconds   supine with strap [place]    Gastroc Stretch Both;2 reps;30 seconds   slant board      Knee/Hip Exercises: Aerobic   Nustep L5 x 6 min U/LE's       Knee/Hip Exercises:  Standing   Hip Abduction Right;Left;1 set;10 reps   UE support as needed    Hip Extension Right;Left;1 set;10 reps   UE support as needed    Forward Step Up Right;Left;15 reps;Hand Hold: 2;Step Height: 6"    Wall Squat 1 set;15 reps;3 seconds   quarter wall squat   SLS R/L SLS x 30 sec x 2 reps, light UE support     Other Standing Knee Exercises side steps Rt/Lt x 40 ft x 1 each direction       Knee/Hip Exercises: Seated   Marching Right;Left;20 reps   UE support as needed    Sit to Sand 10 reps;without UE support   VC for slow stand to sit                              Plan - 08/07/19 1216    Clinical Impression Statement Some improvement in endurance for walking. Patient is working on exercise with increasing endurance. Requires continued verbal cues.    Rehab Potential Good    PT Frequency 2x / week    PT Duration 6 weeks    PT Treatment/Interventions Patient/family education;ADLs/Self Care Home Management;Aquatic Therapy;Cryotherapy;Electrical Stimulation;Iontophoresis 4mg /ml Dexamethasone;Moist Heat;Ultrasound;Gait training;Stair training;Functional mobility training;Therapeutic activities;Therapeutic exercise;Balance training;Neuromuscular re-education;Manual techniques;Dry needling;Taping    PT Next Visit Plan 6 min walk test; progress HEP as tolerated.    PT Home Exercise Plan [id number]    Consulted and Agree with Plan of Care Patient           Patient will benefit from skilled therapeutic intervention in order to improve the following deficits and impairments:     Visit Diagnosis: Leg pain, bilateral  Muscle weakness (generalized)  Other symptoms and signs involving the musculoskeletal system  Decreased functional mobility and endurance     Problem List Patient Active Problem List   Diagnosis Date Noted  . Chronic pain of right knee 07/14/2019  . Chronic constipation 03/24/2019  . Spinal stenosis of lumbar region with neurogenic claudication  11/14/2018  . Vitamin D deficiency 09/05/2018  . Elevated vitamin B12 level 09/05/2018  . Acute neutrophilia 09/05/2018  . Chronic fatigue 08/29/2018  . Generalized weakness 08/29/2018  . Multiple lung nodules 08/03/2018  . Insomnia 03/26/2017  . Aortic atherosclerosis ([place]) 10/19/2016  . Pain medication agreement signed 06/05/2016  . Opioid dependence ([place]) 06/05/2016  . Diabetes mellitus without complication ([place]) 07/01/7626  . Obesity (BMI 30-39.9) 04/13/2016  . Rectal bleeding 11/23/2015  . Moderate episode of recurrent major depressive disorder ([place]) 10/18/2015  . S/P right THA, AA 05/10/2015  . Recurrent left inguinal hernia  07/26/2014  . Chronic right hip pain 06/23/2014  . S/P bilateral inguinal hernia repair 10/30/2013  . Bilateral inguinal hernia 05/28/2013  . Umbilical hernia 25/24/7998  . Mood disorder ([place])   . CAD (coronary artery disease)   . Hyperlipidemia associated with type 2 diabetes mellitus ([place])   . COPD (chronic obstructive pulmonary disease) ([place])   . Tobacco abuse     Dimonique Bourdeau Nilda Simmer PT, MPH  08/07/2019, 12:39 PM  [facility] [place], [place], [address] Phone: [phone]   Fax:  [phone]  Name: Anthony [name]. MRN: [id number] Date of Birth: January 06, 1951

## 2019-08-11 ENCOUNTER — Ambulatory Visit (INDEPENDENT_AMBULATORY_CARE_PROVIDER_SITE_OTHER): Payer: Medicare Other | Admitting: Physical Therapy

## 2019-08-11 ENCOUNTER — Encounter: Payer: Self-pay | Admitting: Physical Therapy

## 2019-08-11 ENCOUNTER — Other Ambulatory Visit: Payer: Self-pay

## 2019-08-11 DIAGNOSIS — M79604 Pain in right leg: Secondary | ICD-10-CM

## 2019-08-11 DIAGNOSIS — M6281 Muscle weakness (generalized): Secondary | ICD-10-CM | POA: Diagnosis not present

## 2019-08-11 DIAGNOSIS — M79605 Pain in left leg: Secondary | ICD-10-CM | POA: Diagnosis not present

## 2019-08-11 DIAGNOSIS — Z7409 Other reduced mobility: Secondary | ICD-10-CM | POA: Diagnosis not present

## 2019-08-11 DIAGNOSIS — R29898 Other symptoms and signs involving the musculoskeletal system: Secondary | ICD-10-CM

## 2019-08-11 NOTE — Therapy (Signed)
[place], [place], [address] Phone: [phone]   Fax:  [phone]  Physical Therapy Treatment  Patient Details  Name: Anthony [name]. MRN: [id number] Date of Birth: 08/04/1950 Referring Provider (PT): Dr [name]   Encounter Date: 08/11/2019   PT End of Session - 08/11/19 1021    Visit Number 4    Number of Visits 12    Date for PT Re-Evaluation 09/09/19    PT Start Time 1021    PT Stop Time 1100    PT Time Calculation (min) 39 min    Activity Tolerance Patient tolerated treatment well           Past Medical History:  Diagnosis Date  . Anxiety   . Arthritis   . Bipolar disorder ([place])   . CAD (coronary artery disease)    a. INF STEMI 07/04/10:  tx with thrombectomy + Vision BMS to [facility];  cath 07/04/10: dLM 10-20%, pLAD 40-50%, mLAD 20-30%, pRCA 30%, mRCA occluded and tx with PCI, EF 50% with inf HK. A Multilink  . COPD (chronic obstructive pulmonary disease) ([place])   . Depression   . Glucose intolerance (impaired glucose tolerance)    A1c 6.2 06/2010  . History of DVT (deep vein thrombosis)    traumatic, s/p coumadin tx.  Marland Kitchen HLD (hyperlipidemia)   . Hypertension    pt states is currently on no medications  . Myocardial infarction ([place])    around 2013  . Shortness of breath    walking distance or climbing stairs  . Tobacco abuse   . Urinary frequency   . Vitamin D deficiency 09/05/2018    Past Surgical History:  Procedure Laterality Date  . CARDIAC CATHETERIZATION    . COLONOSCOPY N/A 12/29/2015   Procedure: COLONOSCOPY;  Surgeon: Rogene Houston, MD;  [facility];  Service: Endoscopy;  Laterality: N/A;  12:55  . ELECTROCARDIOGRAM     Showed inferolateral ST-elevation and a code STEMI was activated. In the ER, he was treated with morphine, herarin, and 600 mg of Plavix. He was transferred emergently to [facility].   . INGUINAL HERNIA REPAIR Left 07/26/2014     Procedure: OPEN REPAIR OF RECURENT LEFT INGUINAL HERNIA REPAIR WITH MESH;  Surgeon: Greer Pickerel, MD;  [facility];  Service: General;  Laterality: Left;  . INSERTION OF MESH Bilateral 10/30/2013   Procedure: INSERTION OF MESH;  Surgeon: Gayland Curry, MD;  [facility];  Service: General;  Laterality: Bilateral;  . LAPAROSCOPIC INGUINAL HERNIA WITH UMBILICAL HERNIA Bilateral 10/30/2013   Procedure: laparoscopic repair left pantaloom hernia with mesh, laparoscopic right inguinal hernia with mesh, OPEN REPAIR OF UMBILICAL HERNIA ;  Surgeon: Gayland Curry, MD;  [facility];  Service: General;  Laterality: Bilateral;  . LAPAROSCOPY N/A 07/26/2014   Procedure: LAPAROSCOPY DIAGNOSTIC;  Surgeon: Greer Pickerel, MD;  [facility];  Service: General;  Laterality: N/A;  . POLYPECTOMY  12/29/2015   Procedure: POLYPECTOMY;  Surgeon: Rogene Houston, MD;  [facility];  Service: Endoscopy;;  ascending colon, descending colon  . stent placement    . TOTAL HIP ARTHROPLASTY Right 05/10/2015   Procedure: RIGHT TOTAL HIP ARTHROPLASTY ANTERIOR APPROACH;  Surgeon: Paralee Cancel, MD;  [facility];  Service: Orthopedics;  Laterality: Right;  Marland Kitchen VASECTOMY      There were no vitals filed for this visit.   Subjective Assessment - 08/11/19 1021  Subjective Pt report his Rt lower leg is bothering him.  "I've been getting more active lately, I'm pleased."   However, pt reports the "walking pain" is not improving.    Pertinent History DVT Rt LE; arthritis; AODM; HTN; MI; hernia repair; depression    Patient Stated Goals get where he can be more mobile; walk more distance; have less pain    Currently in Pain? Yes    Pain Score 5     Pain Location Leg    Pain Orientation Right;Lower    Pain Descriptors / Indicators Sharp;Tightness    Aggravating Factors  walking, later in the day    Pain Relieving Factors sleeping, meds              [facility] PT Assessment - 08/11/19 0001       Assessment   Medical Diagnosis Rt > Lt LE pain - hips and knees     Referring Provider (PT) Dr [name]    Onset Date/Surgical Date 02/09/19    Hand Dominance Right    Next MD Visit no appt scheduled     Prior Therapy here for LBP            [facility] Adult PT Treatment/Exercise - 08/11/19 0001      Knee/Hip Exercises: Stretches   Passive Hamstring Stretch Right;Left;2 reps;30 seconds   supine with strap    Piriformis Stretch Right;Left;2 reps;20 seconds    Gastroc Stretch Right;Left;2 reps;20 seconds      Knee/Hip Exercises: Aerobic   Nustep L5 x 7 min U/LE's       Knee/Hip Exercises: Standing   Heel Raises --    Hip Abduction Right;Left;1 set;10 reps   UE support as needed    Hip Extension Right;Left;1 set;10 reps   UE support as needed    Forward Step Up Right;Left;1 set;10 reps;Hand Hold: 2;Step Height: 6"    SLS R/L SLS x 15 sec x 2 reps, intermittent to light UE support     Other Standing Knee Exercises side steps Rt/Lt x 40 ft x 1 each direction     Other Standing Knee Exercises tandem gait with light UE support on counter x 10 ft each (forward/retro) x 2 reps      Knee/Hip Exercises: Seated   Sit to Sand 10 reps;without UE support   VC for slow stand to sit             Plan - 08/11/19 1314    Clinical Impression Statement Pt's exercise and standing tolerance improving.  Pt able to tolerate standing exercises without break to sit and rest (to offload LE).  Pt requires some cues on counting (speed) and form.  Pt participates well throughout and remains motivated to progress towards goals.    Rehab Potential Good    PT Frequency 2x / week    PT Duration 6 weeks    PT Treatment/Interventions Patient/family education;ADLs/Self Care Home Management;Aquatic Therapy;Cryotherapy;Electrical Stimulation;Iontophoresis 4mg /ml Dexamethasone;Moist Heat;Ultrasound;Gait training;Stair training;Functional mobility training;Therapeutic activities;Therapeutic exercise;Balance  training;Neuromuscular re-education;Manual techniques;Dry needling;Taping    PT Next Visit Plan 6 min walk test; progress HEP as tolerated.    PT Home Exercise Plan [id number]    Consulted and Agree with Plan of Care Patient           Patient will benefit from skilled therapeutic intervention in order to improve the following deficits and impairments:     Visit Diagnosis: Leg pain, bilateral  Muscle weakness (generalized)  Other symptoms and signs involving the  musculoskeletal system  Decreased functional mobility and endurance     Problem List Patient Active Problem List   Diagnosis Date Noted  . Chronic pain of right knee 07/14/2019  . Chronic constipation 03/24/2019  . Spinal stenosis of lumbar region with neurogenic claudication 11/14/2018  . Vitamin D deficiency 09/05/2018  . Elevated vitamin B12 level 09/05/2018  . Acute neutrophilia 09/05/2018  . Chronic fatigue 08/29/2018  . Generalized weakness 08/29/2018  . Multiple lung nodules 08/03/2018  . Insomnia 03/26/2017  . Aortic atherosclerosis ([place]) 10/19/2016  . Pain medication agreement signed 06/05/2016  . Opioid dependence ([place]) 06/05/2016  . Diabetes mellitus without complication ([place]) 35/45/6256  . Obesity (BMI 30-39.9) 04/13/2016  . Rectal bleeding 11/23/2015  . Moderate episode of recurrent major depressive disorder ([place]) 10/18/2015  . S/P right THA, AA 05/10/2015  . Recurrent left inguinal hernia 07/26/2014  . Chronic right hip pain 06/23/2014  . S/P bilateral inguinal hernia repair 10/30/2013  . Bilateral inguinal hernia 05/28/2013  . Umbilical hernia 38/93/7342  . Mood disorder ([place])   . CAD (coronary artery disease)   . Hyperlipidemia associated with type 2 diabetes mellitus ([place])   . COPD (chronic obstructive pulmonary disease) ([place])   . Tobacco abuse    Kerin Perna, [place] 08/11/19 1:17 PM  [place] [place] [address] [place], [place], [address] Phone: [phone]   Fax:  [phone]  Name: Anthony [name]. MRN: [id number] Date of Birth: 12-12-1950

## 2019-08-13 ENCOUNTER — Encounter: Payer: Medicare Other | Admitting: Rehabilitative and Restorative Service Providers"

## 2019-08-18 ENCOUNTER — Other Ambulatory Visit: Payer: Self-pay

## 2019-08-18 ENCOUNTER — Ambulatory Visit (INDEPENDENT_AMBULATORY_CARE_PROVIDER_SITE_OTHER): Payer: Medicare Other | Admitting: Physical Therapy

## 2019-08-18 DIAGNOSIS — M79604 Pain in right leg: Secondary | ICD-10-CM | POA: Diagnosis not present

## 2019-08-18 DIAGNOSIS — M79605 Pain in left leg: Secondary | ICD-10-CM

## 2019-08-18 DIAGNOSIS — M6281 Muscle weakness (generalized): Secondary | ICD-10-CM | POA: Diagnosis not present

## 2019-08-18 DIAGNOSIS — R29898 Other symptoms and signs involving the musculoskeletal system: Secondary | ICD-10-CM

## 2019-08-18 NOTE — Therapy (Signed)
[place], [place], [address] Phone: [phone]   Fax:  [phone]  Physical Therapy Treatment  Patient Details  Name: Anthony [name]. MRN: [id number] Date of Birth: 15-Oct-1950 Referring Provider (PT): Dr [name]   Encounter Date: 08/18/2019   PT End of Session - 08/18/19 1031    Visit Number 5    Number of Visits 12    Date for PT Re-Evaluation 09/09/19    PT Start Time 1025   pt arrived late   PT Stop Time 1058    PT Time Calculation (min) 33 min    Activity Tolerance Patient tolerated treatment well    Behavior During Therapy [facility] for tasks assessed/performed           Past Medical History:  Diagnosis Date  . Anxiety   . Arthritis   . Bipolar disorder ([place])   . CAD (coronary artery disease)    a. INF STEMI 07/04/10:  tx with thrombectomy + Vision BMS to [facility];  cath 07/04/10: dLM 10-20%, pLAD 40-50%, mLAD 20-30%, pRCA 30%, mRCA occluded and tx with PCI, EF 50% with inf HK. A Multilink  . COPD (chronic obstructive pulmonary disease) ([place])   . Depression   . Glucose intolerance (impaired glucose tolerance)    A1c 6.2 06/2010  . History of DVT (deep vein thrombosis)    traumatic, s/p coumadin tx.  Marland Kitchen HLD (hyperlipidemia)   . Hypertension    pt states is currently on no medications  . Myocardial infarction ([place])    around 2013  . Shortness of breath    walking distance or climbing stairs  . Tobacco abuse   . Urinary frequency   . Vitamin D deficiency 09/05/2018    Past Surgical History:  Procedure Laterality Date  . CARDIAC CATHETERIZATION    . COLONOSCOPY N/A 12/29/2015   Procedure: COLONOSCOPY;  Surgeon: Rogene Houston, MD;  [facility];  Service: Endoscopy;  Laterality: N/A;  12:55  . ELECTROCARDIOGRAM     Showed inferolateral ST-elevation and a code STEMI was activated. In the ER, he was treated with morphine, herarin, and 600 mg of Plavix. He was transferred  emergently to [facility].   . INGUINAL HERNIA REPAIR Left 07/26/2014   Procedure: OPEN REPAIR OF RECURENT LEFT INGUINAL HERNIA REPAIR WITH MESH;  Surgeon: Greer Pickerel, MD;  [facility];  Service: General;  Laterality: Left;  . INSERTION OF MESH Bilateral 10/30/2013   Procedure: INSERTION OF MESH;  Surgeon: Gayland Curry, MD;  [facility];  Service: General;  Laterality: Bilateral;  . LAPAROSCOPIC INGUINAL HERNIA WITH UMBILICAL HERNIA Bilateral 10/30/2013   Procedure: laparoscopic repair left pantaloom hernia with mesh, laparoscopic right inguinal hernia with mesh, OPEN REPAIR OF UMBILICAL HERNIA ;  Surgeon: Gayland Curry, MD;  [facility];  Service: General;  Laterality: Bilateral;  . LAPAROSCOPY N/A 07/26/2014   Procedure: LAPAROSCOPY DIAGNOSTIC;  Surgeon: Greer Pickerel, MD;  [facility];  Service: General;  Laterality: N/A;  . POLYPECTOMY  12/29/2015   Procedure: POLYPECTOMY;  Surgeon: Rogene Houston, MD;  [facility];  Service: Endoscopy;;  ascending colon, descending colon  . stent placement    . TOTAL HIP ARTHROPLASTY Right 05/10/2015   Procedure: RIGHT TOTAL HIP ARTHROPLASTY ANTERIOR APPROACH;  Surgeon: Paralee Cancel, MD;  [facility];  Service: Orthopedics;  Laterality: Right;  Marland Kitchen VASECTOMY      There were no vitals  filed for this visit.   Subjective Assessment - 08/18/19 1027    Subjective Pt reports his "legs were awful last night", had to use lidocane and Aleve to help ease some of the pain but overall is pleased with his progress.     Pertinent History DVT Rt LE; arthritis; AODM; HTN; MI; hernia repair; depression    Patient Stated Goals get where he can be more mobile; walk more distance; have less pain    Currently in Pain? Yes    Pain Score 3    "barely anything"   Pain Location Leg    Pain Orientation Right;Lower    Pain Descriptors / Indicators Tightness;Throbbing    Aggravating Factors  walking, later in the day    Pain Relieving  Factors sleeping, meds, roll on lidocane              [facility] PT Assessment - 08/18/19 0001      Assessment   Medical Diagnosis Rt > Lt LE pain - hips and knees     Referring Provider (PT) Dr [name]    Onset Date/Surgical Date 02/09/19    Hand Dominance Right    Next MD Visit no appt scheduled     Prior Therapy here for LBP              [facility] Adult PT Treatment/Exercise - 08/18/19 0001      Knee/Hip Exercises: Stretches   Gastroc Stretch Both;2 reps;30 seconds      Knee/Hip Exercises: Aerobic   Nustep L5: legs only 5 min       Knee/Hip Exercises: Standing   SLS R/L SLS x 15 sec x 2 reps, intermittent to light UE support     Other Standing Knee Exercises side steps Rt/Lt x 40 ft x 1 each direction     Other Standing Knee Exercises tandem gait with light UE support on counter x 10 ft each (forward/retro) x 2 reps      Knee/Hip Exercises: Seated   Other Seated Knee/Hip Exercises sit to/from stand with side step in between each rep Lt/Rt (8 ft table)       Knee/Hip Exercises: Supine   Bridges 1 set;10 reps   3 sec hold in hip ext     Knee/Hip Exercises: Sidelying   Hip ABduction Strengthening;Right;Left;1 set;10 reps      Knee/Hip Exercises: Prone   Hip Extension Strengthening;Right;Left;1 set;10 reps                       PT Long Term Goals - 08/13/19 1037      PT LONG TERM GOAL #1   Title Patient reports ability to walk for 20-30 min with no more than 2 breaks for rest    Baseline -    Time 6    Period Weeks    Status New    Target Date 09/09/19      PT LONG TERM GOAL #2   Title Patient demonstrates increased strength bilat LE's allowing improved functional ability for transfers and transitional movements with improve safety    Baseline -    Time 6    Period Weeks    Status New    Target Date 09/09/19      PT LONG TERM GOAL #3   Title Independent in HEP    Time 6    Period Weeks    Status New    Target Date 09/09/19      PT LONG  TERM GOAL #4   Title Improve FOTO to </= 43% limitation    Baseline -    Time 6    Period Weeks    Status New    Target Date 09/09/19                 Plan - 08/18/19 1056    Clinical Impression Statement Pt was challenged with tandem gait, requiring intermittent UE support on counter. Pt tolerated all exercises well with minimal increase in pain. Pt reporting improved ability to walking with less rest breaks.  Progressing well towards LTGs.    Rehab Potential Good    PT Frequency 2x / week    PT Duration 6 weeks    PT Treatment/Interventions Patient/family education;ADLs/Self Care Home Management;Aquatic Therapy;Cryotherapy;Electrical Stimulation;Iontophoresis 4mg /ml Dexamethasone;Moist Heat;Ultrasound;Gait training;Stair training;Functional mobility training;Therapeutic activities;Therapeutic exercise;Balance training;Neuromuscular re-education;Manual techniques;Dry needling;Taping    PT Next Visit Plan 6 min walk test; progress HEP as tolerated.    PT Home Exercise Plan [id number]    Consulted and Agree with Plan of Care Patient           Patient will benefit from skilled therapeutic intervention in order to improve the following deficits and impairments:     Visit Diagnosis: Leg pain, bilateral  Muscle weakness (generalized)  Other symptoms and signs involving the musculoskeletal system     Problem List Patient Active Problem List   Diagnosis Date Noted  . Chronic pain of right knee 07/14/2019  . Chronic constipation 03/24/2019  . Spinal stenosis of lumbar region with neurogenic claudication 11/14/2018  . Vitamin D deficiency 09/05/2018  . Elevated vitamin B12 level 09/05/2018  . Acute neutrophilia 09/05/2018  . Chronic fatigue 08/29/2018  . Generalized weakness 08/29/2018  . Multiple lung nodules 08/03/2018  . Insomnia 03/26/2017  . Aortic atherosclerosis ([place]) 10/19/2016  . Pain medication agreement signed 06/05/2016  . Opioid dependence ([place]) 06/05/2016    . Diabetes mellitus without complication ([place]) 60/15/6153  . Obesity (BMI 30-39.9) 04/13/2016  . Rectal bleeding 11/23/2015  . Moderate episode of recurrent major depressive disorder ([place]) 10/18/2015  . S/P right THA, AA 05/10/2015  . Recurrent left inguinal hernia 07/26/2014  . Chronic right hip pain 06/23/2014  . S/P bilateral inguinal hernia repair 10/30/2013  . Bilateral inguinal hernia 05/28/2013  . Umbilical hernia 79/43/2761  . Mood disorder ([place])   . CAD (coronary artery disease)   . Hyperlipidemia associated with type 2 diabetes mellitus ([place])   . COPD (chronic obstructive pulmonary disease) ([place])   . Tobacco abuse    Kerin Perna, [place] 08/18/19 1:09 PM  [place], [place], [address] Phone: [phone]   Fax:  [phone]  Name: Vick Filter. MRN: [id number] Date of Birth: November 04, 1950

## 2019-08-20 ENCOUNTER — Encounter: Payer: Medicare Other | Admitting: Physical Therapy

## 2019-08-20 NOTE — Addendum Note (Signed)
Addended by: Everardo All on: 08/20/2019 01:15 PM   Modules accepted: Orders

## 2019-08-21 ENCOUNTER — Other Ambulatory Visit: Payer: Self-pay

## 2019-08-21 MED ORDER — TEMAZEPAM 30 MG PO CAPS: 30.0000 mg | ORAL_CAPSULE | Freq: Every evening | ORAL | 0 refills | Status: DC | PRN

## 2019-08-21 MED ORDER — TRAZODONE HCL 50 MG PO TABS: 50.0000 mg | ORAL_TABLET | Freq: Every evening | ORAL | 1 refills | Status: DC | PRN

## 2019-08-21 NOTE — Telephone Encounter (Signed)
Anthony [name] needs a refill on Temazepam. He has a follow up next weeks.

## 2019-08-24 ENCOUNTER — Ambulatory Visit (INDEPENDENT_AMBULATORY_CARE_PROVIDER_SITE_OTHER): Payer: Medicare Other | Admitting: Physical Therapy

## 2019-08-24 ENCOUNTER — Other Ambulatory Visit: Payer: Self-pay

## 2019-08-24 DIAGNOSIS — M79605 Pain in left leg: Secondary | ICD-10-CM | POA: Diagnosis not present

## 2019-08-24 DIAGNOSIS — M6281 Muscle weakness (generalized): Secondary | ICD-10-CM | POA: Diagnosis not present

## 2019-08-24 DIAGNOSIS — R29898 Other symptoms and signs involving the musculoskeletal system: Secondary | ICD-10-CM | POA: Diagnosis not present

## 2019-08-24 DIAGNOSIS — M79604 Pain in right leg: Secondary | ICD-10-CM

## 2019-08-24 DIAGNOSIS — Z7409 Other reduced mobility: Secondary | ICD-10-CM | POA: Diagnosis not present

## 2019-08-24 NOTE — Therapy (Signed)
[place], [place], [address] Phone: [phone]   Fax:  [phone]  Physical Therapy Treatment  Patient Details  Name: Anthony [name]. MRN: [id number] Date of Birth: September 07, 1950 Referring Provider (PT): Dr [name]   Encounter Date: 08/24/2019   PT End of Session - 08/24/19 1025    Visit Number 6    Number of Visits 12    Date for PT Re-Evaluation 09/09/19    PT Start Time 1024   pt arrived late   PT Stop Time 1055    PT Time Calculation (min) 31 min    Activity Tolerance Patient tolerated treatment well    Behavior During Therapy [facility] for tasks assessed/performed           Past Medical History:  Diagnosis Date  . Anxiety   . Arthritis   . Bipolar disorder ([place])   . CAD (coronary artery disease)    a. INF STEMI 07/04/10:  tx with thrombectomy + Vision BMS to [facility];  cath 07/04/10: dLM 10-20%, pLAD 40-50%, mLAD 20-30%, pRCA 30%, mRCA occluded and tx with PCI, EF 50% with inf HK. A Multilink  . COPD (chronic obstructive pulmonary disease) ([place])   . Depression   . Glucose intolerance (impaired glucose tolerance)    A1c 6.2 06/2010  . History of DVT (deep vein thrombosis)    traumatic, s/p coumadin tx.  Marland Kitchen HLD (hyperlipidemia)   . Hypertension    pt states is currently on no medications  . Myocardial infarction ([place])    around 2013  . Shortness of breath    walking distance or climbing stairs  . Tobacco abuse   . Urinary frequency   . Vitamin D deficiency 09/05/2018    Past Surgical History:  Procedure Laterality Date  . CARDIAC CATHETERIZATION    . COLONOSCOPY N/A 12/29/2015   Procedure: COLONOSCOPY;  Surgeon: Rogene Houston, MD;  [facility];  Service: Endoscopy;  Laterality: N/A;  12:55  . ELECTROCARDIOGRAM     Showed inferolateral ST-elevation and a code STEMI was activated. In the ER, he was treated with morphine, herarin, and 600 mg of Plavix. He was transferred  emergently to [facility].   . INGUINAL HERNIA REPAIR Left 07/26/2014   Procedure: OPEN REPAIR OF RECURENT LEFT INGUINAL HERNIA REPAIR WITH MESH;  Surgeon: Greer Pickerel, MD;  [facility]: [facility];  Service: General;  Laterality: Left;  . INSERTION OF MESH Bilateral 10/30/2013   Procedure: INSERTION OF MESH;  Surgeon: Gayland Curry, MD;  [facility];  Service: General;  Laterality: Bilateral;  . LAPAROSCOPIC INGUINAL HERNIA WITH UMBILICAL HERNIA Bilateral 10/30/2013   Procedure: laparoscopic repair left pantaloom hernia with mesh, laparoscopic right inguinal hernia with mesh, OPEN REPAIR OF UMBILICAL HERNIA ;  Surgeon: Gayland Curry, MD;  [facility];  Service: General;  Laterality: Bilateral;  . LAPAROSCOPY N/A 07/26/2014   Procedure: LAPAROSCOPY DIAGNOSTIC;  Surgeon: Greer Pickerel, MD;  [facility];  Service: General;  Laterality: N/A;  . POLYPECTOMY  12/29/2015   Procedure: POLYPECTOMY;  Surgeon: Rogene Houston, MD;  [facility];  Service: Endoscopy;;  ascending colon, descending colon  . stent placement    . TOTAL HIP ARTHROPLASTY Right 05/10/2015   Procedure: RIGHT TOTAL HIP ARTHROPLASTY ANTERIOR APPROACH;  Surgeon: Paralee Cancel, MD;  [facility];  Service: Orthopedics;  Laterality: Right;  Marland Kitchen VASECTOMY      There were no vitals  filed for this visit.   Subjective Assessment - 08/24/19 1025    Subjective "My activity level is through the roof.  My depression is in the rear view mirror".  Pt reports he can walk 10-15 min prior to needing to take rest break.    Pertinent History DVT Rt LE; arthritis; AODM; HTN; MI; hernia repair; depression    Patient Stated Goals get where he can be more mobile; walk more distance; have less pain    Currently in Pain? Yes    Pain Score 3    "a wee bit".   Pain Location Leg    Pain Orientation Right;Lower    Pain Descriptors / Indicators Tightness;Throbbing    Aggravating Factors  later in the day    Pain Relieving Factors  sleeping, meds, roll on lidocane              [facility] PT Assessment - 08/24/19 0001      Assessment   Medical Diagnosis Rt > Lt LE pain - hips and knees     Referring Provider (PT) Dr [name]    Onset Date/Surgical Date 02/09/19    Hand Dominance Right    Next MD Visit no appt scheduled     Prior Therapy here for LBP       Strength   Right Hip Extension 5/5    Left Hip Extension 5/5            [facility] Adult PT Treatment/Exercise - 08/24/19 0001      Knee/Hip Exercises: Stretches   Passive Hamstring Stretch Right;Left;2 reps;20 seconds    Gastroc Stretch Both;2 reps;30 seconds      Knee/Hip Exercises: Aerobic   Nustep L5: legs only 6 min       Knee/Hip Exercises: Standing   Forward Step Up Right;Left;1 set;10 reps;Hand Hold: 2    Wall Squat 1 set;10 reps    SLS R/L SLS x 15 sec x 2 reps, intermittent to light UE support     Other Standing Knee Exercises tandem gait x 8 ft forward/ backward     Other Standing Knee Exercises tandem stance x 30 sec each leg forward.       Knee/Hip Exercises: Supine   Bridges 1 set;10 reps   3 sec hold in hip ext     Knee/Hip Exercises: Sidelying   Hip ABduction Strengthening;Right;Left;1 set;10 reps   slow controlled reps     Knee/Hip Exercises: Prone   Hip Extension Right;Left;1 set;10 reps            PT Long Term Goals - 08/24/19 1029      PT LONG TERM GOAL #1   Title Patient reports ability to walk for 20-30 min with no more than 2 breaks for rest    Baseline -    Time 6    Period Weeks    Status Partially Met      PT LONG TERM GOAL #2   Title Patient demonstrates increased strength bilat LE's allowing improved functional ability for transfers and transitional movements with improve safety    Baseline -    Time 6    Period Weeks    Status Achieved      PT LONG TERM GOAL #3   Title Independent in HEP    Time 6    Period Weeks    Status On-going      PT LONG TERM GOAL #4   Title Improve FOTO to </= 43%  limitation  Baseline -    Time 6    Period Weeks    Status On-going                 Plan - 08/24/19 1041    Clinical Impression Statement Pt demonstrated improved hip ext strength since initial visit. He tolerates all exercises well during session, without increase in pain.  Pt has partially met his goals.  Pt verbalized readiness to hold therapy after next visit.    Rehab Potential Good    PT Frequency 2x / week    PT Duration 6 weeks    PT Treatment/Interventions Patient/family education;ADLs/Self Care Home Management;Aquatic Therapy;Cryotherapy;Electrical Stimulation;Iontophoresis 32m/ml Dexamethasone;Moist Heat;Ultrasound;Gait training;Stair training;Functional mobility training;Therapeutic activities;Therapeutic exercise;Balance training;Neuromuscular re-education;Manual techniques;Dry needling;Taping    PT Next Visit Plan 6 min walk test; progress HEP as tolerated.    PT Home Exercise Plan [id number]    Consulted and Agree with Plan of Care Patient           Patient will benefit from skilled therapeutic intervention in order to improve the following deficits and impairments:     Visit Diagnosis: Leg pain, bilateral  Muscle weakness (generalized)  Other symptoms and signs involving the musculoskeletal system  Decreased functional mobility and endurance     Problem List Patient Active Problem List   Diagnosis Date Noted  . Chronic pain of right knee 07/14/2019  . Chronic constipation 03/24/2019  . Spinal stenosis of lumbar region with neurogenic claudication 11/14/2018  . Vitamin D deficiency 09/05/2018  . Elevated vitamin B12 level 09/05/2018  . Acute neutrophilia 09/05/2018  . Chronic fatigue 08/29/2018  . Generalized weakness 08/29/2018  . Multiple lung nodules 08/03/2018  . Insomnia 03/26/2017  . Aortic atherosclerosis ([place] 10/19/2016  . Pain medication agreement signed 06/05/2016  . Opioid dependence ([place] 06/05/2016  . Diabetes mellitus without  complication ([place] 065/79/0383 . Obesity (BMI 30-39.9) 04/13/2016  . Rectal bleeding 11/23/2015  . Moderate episode of recurrent major depressive disorder ([place] 10/18/2015  . S/P right THA, AA 05/10/2015  . Recurrent left inguinal hernia 07/26/2014  . Chronic right hip pain 06/23/2014  . S/P bilateral inguinal hernia repair 10/30/2013  . Bilateral inguinal hernia 05/28/2013  . Umbilical hernia 033/83/2919 . Mood disorder ([place]   . CAD (coronary artery disease)   . Hyperlipidemia associated with type 2 diabetes mellitus ([place]   . COPD (chronic obstructive pulmonary disease) ([place]   . Tobacco abuse    JKerin Perna PDelaware08/16/21 1:30 PM  [address] [place] [address]Phone: [phone]  Fax:  [phone] Name: Anthony [name] MRN: [id number] of Birth: [birth year]

## 2019-08-26 ENCOUNTER — Other Ambulatory Visit: Payer: Self-pay

## 2019-08-26 ENCOUNTER — Encounter: Payer: Self-pay | Admitting: Family Medicine

## 2019-08-26 ENCOUNTER — Ambulatory Visit (INDEPENDENT_AMBULATORY_CARE_PROVIDER_SITE_OTHER): Payer: Medicare Other | Admitting: Rehabilitative and Restorative Service Providers"

## 2019-08-26 ENCOUNTER — Encounter: Payer: Self-pay | Admitting: Rehabilitative and Restorative Service Providers"

## 2019-08-26 ENCOUNTER — Ambulatory Visit (INDEPENDENT_AMBULATORY_CARE_PROVIDER_SITE_OTHER): Payer: Medicare Other | Admitting: Family Medicine

## 2019-08-26 DIAGNOSIS — R29898 Other symptoms and signs involving the musculoskeletal system: Secondary | ICD-10-CM | POA: Diagnosis not present

## 2019-08-26 DIAGNOSIS — M79604 Pain in right leg: Secondary | ICD-10-CM

## 2019-08-26 DIAGNOSIS — M25561 Pain in right knee: Secondary | ICD-10-CM

## 2019-08-26 DIAGNOSIS — G8929 Other chronic pain: Secondary | ICD-10-CM

## 2019-08-26 DIAGNOSIS — I739 Peripheral vascular disease, unspecified: Secondary | ICD-10-CM | POA: Diagnosis not present

## 2019-08-26 DIAGNOSIS — F5105 Insomnia due to other mental disorder: Secondary | ICD-10-CM

## 2019-08-26 DIAGNOSIS — M79605 Pain in left leg: Secondary | ICD-10-CM | POA: Diagnosis not present

## 2019-08-26 DIAGNOSIS — G3281 Cerebellar ataxia in diseases classified elsewhere: Secondary | ICD-10-CM | POA: Insufficient documentation

## 2019-08-26 DIAGNOSIS — E1165 Type 2 diabetes mellitus with hyperglycemia: Secondary | ICD-10-CM

## 2019-08-26 DIAGNOSIS — M6281 Muscle weakness (generalized): Secondary | ICD-10-CM | POA: Diagnosis not present

## 2019-08-26 DIAGNOSIS — Z7409 Other reduced mobility: Secondary | ICD-10-CM | POA: Diagnosis not present

## 2019-08-26 DIAGNOSIS — F99 Mental disorder, not otherwise specified: Secondary | ICD-10-CM

## 2019-08-26 MED ORDER — METOPROLOL SUCCINATE ER 25 MG PO TB24: 12.5000 mg | ORAL_TABLET | Freq: Every day | ORAL | 0 refills | Status: DC

## 2019-08-26 MED ORDER — METFORMIN HCL ER 500 MG PO TB24: ORAL_TABLET | ORAL | 1 refills | Status: DC

## 2019-08-26 MED ORDER — TEMAZEPAM 30 MG PO CAPS: 30.0000 mg | ORAL_CAPSULE | Freq: Every evening | ORAL | 2 refills | Status: DC | PRN

## 2019-08-26 NOTE — Therapy (Addendum)
[place], [place], [address] Phone: [phone]   Fax:  [phone]  Physical Therapy Treatment  Patient Details  Name: Anthony [name]. MRN: [id number] Date of Birth: 04/29/1950 Referring Provider (PT): Dr [name]   Encounter Date: 08/26/2019   PT End of Session - 08/26/19 1019    Visit Number 7    Number of Visits 12    Date for PT Re-Evaluation 09/09/19    PT Start Time 1017    PT Stop Time 1053    PT Time Calculation (min) 36 min    Activity Tolerance Patient tolerated treatment well           Past Medical History:  Diagnosis Date  . Anxiety   . Arthritis   . Bipolar disorder ([place])   . CAD (coronary artery disease)    a. INF STEMI 07/04/10:  tx with thrombectomy + Vision BMS to [facility];  cath 07/04/10: dLM 10-20%, pLAD 40-50%, mLAD 20-30%, pRCA 30%, mRCA occluded and tx with PCI, EF 50% with inf HK. A Multilink  . COPD (chronic obstructive pulmonary disease) ([place])   . Depression   . Glucose intolerance (impaired glucose tolerance)    A1c 6.2 06/2010  . History of DVT (deep vein thrombosis)    traumatic, s/p coumadin tx.  Marland Kitchen HLD (hyperlipidemia)   . Hypertension    pt states is currently on no medications  . Myocardial infarction ([place])    around 2013  . Shortness of breath    walking distance or climbing stairs  . Tobacco abuse   . Urinary frequency   . Vitamin D deficiency 09/05/2018    Past Surgical History:  Procedure Laterality Date  . CARDIAC CATHETERIZATION    . COLONOSCOPY N/A 12/29/2015   Procedure: COLONOSCOPY;  Surgeon: Rogene Houston, MD;  [facility];  Service: Endoscopy;  Laterality: N/A;  12:55  . ELECTROCARDIOGRAM     Showed inferolateral ST-elevation and a code STEMI was activated. In the ER, he was treated with morphine, herarin, and 600 mg of Plavix. He was transferred emergently to [facility].   . INGUINAL HERNIA REPAIR Left 07/26/2014    Procedure: OPEN REPAIR OF RECURENT LEFT INGUINAL HERNIA REPAIR WITH MESH;  Surgeon: Greer Pickerel, MD;  [facility];  Service: General;  Laterality: Left;  . INSERTION OF MESH Bilateral 10/30/2013   Procedure: INSERTION OF MESH;  Surgeon: Gayland Curry, MD;  [facility];  Service: General;  Laterality: Bilateral;  . LAPAROSCOPIC INGUINAL HERNIA WITH UMBILICAL HERNIA Bilateral 10/30/2013   Procedure: laparoscopic repair left pantaloom hernia with mesh, laparoscopic right inguinal hernia with mesh, OPEN REPAIR OF UMBILICAL HERNIA ;  Surgeon: Gayland Curry, MD;  [facility];  Service: General;  Laterality: Bilateral;  . LAPAROSCOPY N/A 07/26/2014   Procedure: LAPAROSCOPY DIAGNOSTIC;  Surgeon: Greer Pickerel, MD;  [facility];  Service: General;  Laterality: N/A;  . POLYPECTOMY  12/29/2015   Procedure: POLYPECTOMY;  Surgeon: Rogene Houston, MD;  [facility];  Service: Endoscopy;;  ascending colon, descending colon  . stent placement    . TOTAL HIP ARTHROPLASTY Right 05/10/2015   Procedure: RIGHT TOTAL HIP ARTHROPLASTY ANTERIOR APPROACH;  Surgeon: Paralee Cancel, MD;  [facility];  Service: Orthopedics;  Laterality: Right;  Marland Kitchen VASECTOMY      There were no vitals filed for this visit.   Subjective Assessment - 08/26/19 1020  Subjective Legs are better but they still hurt. He can now walk about 500 yards before having pain; about 10-15 minutes min before needing a break to rest.    Currently in Pain? Yes    Pain Score 3     Pain Location Leg    Pain Orientation Right;Left    Pain Descriptors / Indicators Tightness    Pain Type Chronic pain    Pain Radiating Towards ankles; knees and thighs    Pain Onset More than a month ago    Pain Frequency Intermittent    Aggravating Factors  with walking; better mid day and worse toward the end of the day    Pain Relieving Factors sleeping; meds; roll on lidocane              [facility] PT Assessment - 08/26/19 0001       Assessment   Medical Diagnosis Rt > Lt LE pain - hips and knees     Referring Provider (PT) Dr [name]    Onset Date/Surgical Date 02/09/19    Hand Dominance Right    Next MD Visit no appt scheduled     Prior Therapy here for LBP       Observation/Other Assessments   Focus on Therapeutic Outcomes (FOTO)  37% limitation       Strength   Right Hip Flexion 5/5    Right Hip Extension 5/5    Left Hip Flexion 5/5    Left Hip Extension 5/5    Left Hip ABduction 5/5    Right Knee Flexion 5/5    Right Knee Extension 5/5    Left Knee Flexion 5/5    Left Knee Extension 5/5    Right Ankle Dorsiflexion 5/5    Right Ankle Plantar Flexion 5/5    Left Ankle Dorsiflexion 5/5    Left Ankle Plantar Flexion 5/5                         [facility] Adult PT Treatment/Exercise - 08/26/19 0001      Knee/Hip Exercises: Aerobic   Nustep L5 x 10 min U/LE's       Knee/Hip Exercises: Standing   Heel Raises Both;10 reps    Lateral Step Up Right;Left;20 reps;Hand Hold: 1;Step Height: 6"    Forward Step Up Right;Left;1 set;20 reps;Hand Hold: 2;Step Height: 6"    Functional Squat 10 reps    SLS R/L SLS x 15 sec x 2 reps, UE support as needed     Gait Training side steps Rt/Lt 10 ft x 5 reps     Other Standing Knee Exercises tandem gait x 8 ft forward/ backward x 3 reps     Other Standing Knee Exercises tandem stance x 30 sec each leg forward x 3 reps each leg forward        Knee/Hip Exercises: Seated   Sit to Sand 10 reps;without UE support   slow stand to sit                       PT Long Term Goals - 08/26/19 1029      PT LONG TERM GOAL #1   Title Patient reports ability to walk for 20-30 min with no more than 2 breaks for rest    Time 6    Period Weeks    Status Partially Met      PT LONG TERM GOAL #2   Title Patient demonstrates  increased strength bilat LE's allowing improved functional ability for transfers and transitional movements with improve safety     Time 6    Status Achieved      PT LONG TERM GOAL #3   Title Independent in HEP    Time 6    Period Weeks    Status On-going      PT LONG TERM GOAL #4   Title Improve FOTO to </= 43% limitation    Time 6    Period Weeks    Status Achieved                 Plan - 08/26/19 1026    Clinical Impression Statement Patient reports some increase in time/distance for walking before onset of pain. He continues to have pain in Rt >Lt LE's with pain in the ankles/legs/knees/thighs. Symptoms are worse in the morning and toward late afternoon and evening. Goals of therapy are partially accomplished. Strength is 5/5 bilat LE's He continues to have some muscular tightness at end ranges through LE's.    Rehab Potential Good    PT Frequency 2x / week    PT Duration 6 weeks    PT Treatment/Interventions Patient/family education;ADLs/Self Care Home Management;Aquatic Therapy;Cryotherapy;Electrical Stimulation;Iontophoresis 4mg /ml Dexamethasone;Moist Heat;Ultrasound;Gait training;Stair training;Functional mobility training;Therapeutic activities;Therapeutic exercise;Balance training;Neuromuscular re-education;Manual techniques;Dry needling;Taping    PT Next Visit Plan Independent HEP; will call with any questions or problems - place pt on hold for 30 days and he will call to schedule as needed    PT Home Exercise Plan [id number]    Consulted and Agree with Plan of Care Patient           Patient will benefit from skilled therapeutic intervention in order to improve the following deficits and impairments:     Visit Diagnosis: Leg pain, bilateral  Muscle weakness (generalized)  Other symptoms and signs involving the musculoskeletal system  Decreased functional mobility and endurance     Problem List Patient Active Problem List   Diagnosis Date Noted  . Chronic pain of right knee 07/14/2019  . Chronic constipation 03/24/2019  . Spinal stenosis of lumbar region with neurogenic claudication  11/14/2018  . Vitamin D deficiency 09/05/2018  . Elevated vitamin B12 level 09/05/2018  . Acute neutrophilia 09/05/2018  . Chronic fatigue 08/29/2018  . Generalized weakness 08/29/2018  . Multiple lung nodules 08/03/2018  . Insomnia 03/26/2017  . Aortic atherosclerosis ([place]) 10/19/2016  . Pain medication agreement signed 06/05/2016  . Opioid dependence ([place]) 06/05/2016  . Diabetes mellitus without complication ([place]) 65/99/3570  . Obesity (BMI 30-39.9) 04/13/2016  . Rectal bleeding 11/23/2015  . Moderate episode of recurrent major depressive disorder ([place]) 10/18/2015  . S/P right THA, AA 05/10/2015  . Recurrent left inguinal hernia 07/26/2014  . Chronic right hip pain 06/23/2014  . S/P bilateral inguinal hernia repair 10/30/2013  . Bilateral inguinal hernia 05/28/2013  . Umbilical hernia 17/79/3903  . Mood disorder ([place])   . CAD (coronary artery disease)   . Hyperlipidemia associated with type 2 diabetes mellitus ([place])   . COPD (chronic obstructive pulmonary disease) ([place])   . Tobacco abuse     Alto Gandolfo Nilda Simmer PT, MPH  08/26/2019, 10:56 AM  [facility] [place], [place], [address] Phone: [phone]   Fax:  [phone]  Name: Griffon Herberg. MRN: [id number] Date of Birth: [birth year]  PHYSICAL THERAPY DISCHARGE SUMMARY  Visits from Start of Care: 7  Current functional level related to goals /  functional outcomes: See progress note for discharge status    Remaining deficits: Continued pain in LE's but has increased walking and HEP    Education / Equipment: HEP   Plan: Patient agrees to discharge.  Patient goals were partially met. Patient is being discharged due to being pleased with the current functional level.  ?????    Natash Berman P. Helene Kelp PT, MPH 09/16/19 1:56 PM

## 2019-08-26 NOTE — Assessment & Plan Note (Signed)
He has had good improvement with PT for this.  He will continue home exercises and conditioning.

## 2019-08-26 NOTE — Assessment & Plan Note (Signed)
He is doing well with trazodone and temazepam.  Will continue at current dosing for these medications.

## 2019-08-26 NOTE — Progress Notes (Signed)
[name] - 69 y.o. male MRN [id number]  Date of birth: 1950/10/28  Subjective Chief Complaint  Patient presents with  . Insomnia    HPI Anthony [name]. is a 69 y.o. male here today for follow up of insomnia and back/hip pain/leg pain.  He reports he has had good response to PT and has been able to increase his walking distance before having to rest due to pain.  He will continue home exercise and conditioning to further improve this.   He is sleeping well with combination of trazodone and temazepam.  Occasionally he will take 3 trazodone but most of the time does ok with just 2.  He denies side effects including oversedation with these medications.    ROS:  A comprehensive ROS was completed and negative except as noted per HPI    Allergies  Allergen Reactions  . Flomax [Tamsulosin Hcl] Other (See Comments)    This medication makes patient feel sick  . Penicillins Other (See Comments)    Reaction unknown occurred during childhood Has patient had a PCN reaction causing immediate rash, facial/tongue/throat swelling, SOB or lightheadedness with hypotension: unsure - childhood reaction Has patient had a PCN reaction causing severe rash involving mucus membranes or skin necrosis: unsure - childhood reaction Has patient had a PCN reaction that required hospitalization no Has patient had a PCN reaction occurring within the last 10 years: no If all of the above answers are "NO", then may p    Past Medical History:  Diagnosis Date  . Anxiety   . Arthritis   . Bipolar disorder ([place])   . CAD (coronary artery disease)    a. INF STEMI 07/04/10:  tx with thrombectomy + [facility] to [facility];  cath 07/04/10: dLM 10-20%, pLAD 40-50%, mLAD 20-30%, pRCA 30%, mRCA occluded and tx with PCI, EF 50% with inf HK. A Multilink  . COPD (chronic obstructive pulmonary disease) ([place])   . Depression   . Glucose intolerance (impaired glucose tolerance)    A1c 6.2 06/2010  . History of DVT (deep vein  thrombosis)    traumatic, s/p coumadin tx.  Marland Kitchen HLD (hyperlipidemia)   . Hypertension    pt states is currently on no medications  . Myocardial infarction ([place])    around 2013  . Shortness of breath    walking distance or climbing stairs  . Tobacco abuse   . Urinary frequency   . Vitamin D deficiency 09/05/2018    Past Surgical History:  Procedure Laterality Date  . CARDIAC CATHETERIZATION    . COLONOSCOPY N/A 12/29/2015   Procedure: COLONOSCOPY;  Surgeon: Rogene Houston, MD;  Location: [facility];  Service: Endoscopy;  Laterality: N/A;  12:55  . ELECTROCARDIOGRAM     Showed inferolateral ST-elevation and a code STEMI was activated. In the ER, he was treated with morphine, herarin, and 600 mg of Plavix. He was transferred emergently to [facility].   . INGUINAL HERNIA REPAIR Left 07/26/2014   Procedure: OPEN REPAIR OF RECURENT LEFT INGUINAL HERNIA REPAIR WITH MESH;  Surgeon: Greer Pickerel, MD;  Location: [facility];  Service: General;  Laterality: Left;  . INSERTION OF MESH Bilateral 10/30/2013   Procedure: INSERTION OF MESH;  Surgeon: Gayland Curry, MD;  Location: [facility];  Service: General;  Laterality: Bilateral;  . LAPAROSCOPIC INGUINAL HERNIA WITH UMBILICAL HERNIA Bilateral 10/30/2013   Procedure: laparoscopic repair left pantaloom hernia with mesh, laparoscopic right inguinal hernia with mesh, OPEN REPAIR OF  UMBILICAL HERNIA ;  Surgeon: Gayland Curry, MD;  Location: [facility];  Service: General;  Laterality: Bilateral;  . LAPAROSCOPY N/A 07/26/2014   Procedure: LAPAROSCOPY DIAGNOSTIC;  Surgeon: Greer Pickerel, MD;  Location: [facility];  Service: General;  Laterality: N/A;  . POLYPECTOMY  12/29/2015   Procedure: POLYPECTOMY;  Surgeon: Rogene Houston, MD;  [facility]: [facility];  Service: Endoscopy;;  ascending colon, descending colon  . stent placement    . TOTAL HIP ARTHROPLASTY Right 05/10/2015   Procedure: RIGHT TOTAL HIP ARTHROPLASTY ANTERIOR APPROACH;  Surgeon: Paralee Cancel, MD;   Location: [facility];  Service: Orthopedics;  Laterality: Right;  Marland Kitchen VASECTOMY      Social History   Socioeconomic History  . Marital status: Divorced    Spouse name: Not on file  . Number of children: Not on file  . Years of education: Not on file  . Highest education level: Not on file  Occupational History  . Not on file  Tobacco Use  . Smoking status: Light Tobacco Smoker    Packs/day: 1.50    Years: 45.00    Pack years: 67.50    Types: Cigarettes  . Smokeless tobacco: Never Used  Vaping Use  . Vaping Use: Never used  Substance and Sexual Activity  . Alcohol use: No    Comment: past hx of ETOH use was in inpt facility per court order  - 20 years, 10-18-2015 per pt not anymore,per pt stopped about 15-20 yrs ago  . Drug use: No    Types: Marijuana, Heroin, Cocaine    Comment: snorted heroin and cocaine, 10-18-2015 per pt in the past he did Marijuana only and nothing else  . Sexual activity: Not Currently    Partners: Female  Other Topics Concern  . Not on file  Social History Narrative   Lives in [place]. He lives alone. He has kids but is not married.    Social Determinants of Health   Financial Resource Strain:   . Difficulty of Paying Living Expenses:   Food Insecurity:   . Worried About Charity fundraiser in the Last Year:   . Arboriculturist in the Last Year:   Transportation Needs:   . Film/video editor (Medical):   Marland Kitchen Lack of Transportation (Non-Medical):   Physical Activity:   . Days of Exercise per Week:   . Minutes of Exercise per Session:   Stress:   . Feeling of Stress :   Social Connections:   . Frequency of Communication with Friends and Family:   . Frequency of Social Gatherings with Friends and Family:   . Attends Religious Services:   . Active Member of Clubs or Organizations:   . Attends Archivist Meetings:   Marland Kitchen Marital Status:     Family History  Problem Relation Age of Onset  . Coronary artery disease Father   . Depression  Father   . Heart attack Father   . Depression Mother     Health Maintenance  Topic Date Due  . COVID-19 Vaccine (1) Never done  . OPHTHALMOLOGY EXAM  03/18/2019  . INFLUENZA VACCINE  08/09/2019  . PNA vac Low Risk Adult (1 of 2 - PCV13) 10/21/2019 (Originally 10/10/2013)  . HEMOGLOBIN A1C  09/26/2019  . FOOT EXAM  10/21/2019  . COLONOSCOPY  12/28/2025  . TETANUS/TDAP  12/08/2026  . Hepatitis C Screening  Completed     ----------------------------------------------------------------------------------------------------------------------------------------------------------------------------------------------------------------- Physical Exam BP (!) 92/56   Pulse 75  Temp 97.8 F (36.6 C) (Oral)   Wt 185 lb (83.9 kg)   SpO2 93% Comment: on RA  BMI 27.40 kg/m   Physical Exam Constitutional:      Appearance: Normal appearance.  HENT:     Head: Normocephalic and atraumatic.  Eyes:     General: No scleral icterus. Cardiovascular:     Rate and Rhythm: Normal rate and regular rhythm.  Pulmonary:     Effort: Pulmonary effort is normal.     Breath sounds: Normal breath sounds.  Musculoskeletal:     Cervical back: Neck supple.  Neurological:     Mental Status: He is alert.  Psychiatric:        Mood and Affect: Mood normal.        Behavior: Behavior normal.     ------------------------------------------------------------------------------------------------------------------------------------------------------------------------------------------------------------------- Assessment and Plan  Chronic pain of right knee He has had good improvement with PT for this.  He will continue home exercises and conditioning.    Insomnia He is doing well with trazodone and temazepam.  Will continue at current dosing for these medications.    Meds ordered this encounter  Medications  . metFORMIN (GLUCOPHAGE-XR) 500 MG 24 hr tablet    Sig: TAKE 2 TABLETS BY MOUTH ONCE DAILY WITH  BREAKFAST.    Dispense:  180 tablet    Refill:  1  . metoprolol succinate (TOPROL-XL) 25 MG 24 hr tablet    Sig: Take 0.5 tablets (12.5 mg total) by mouth daily.    Dispense:  90 tablet    Refill:  0    Please forward all refills request to new PCP - Dr. [name].  . temazepam (RESTORIL) 30 MG capsule    Sig: Take 1 capsule (30 mg total) by mouth at bedtime as needed for sleep.    Dispense:  30 capsule    Refill:  2    Return in about 3 months (around 11/26/2019) for T2DM.    This visit occurred during the SARS-CoV-2 public health emergency.  Safety protocols were in place, including screening questions prior to the visit, additional usage of staff PPE, and extensive cleaning of exam room while observing appropriate contact time as indicated for disinfecting solutions.

## 2019-08-26 NOTE — Patient Instructions (Signed)
Nice to see you today! Please continue current medications See me again in about 3 months.

## 2019-09-25 ENCOUNTER — Ambulatory Visit (INDEPENDENT_AMBULATORY_CARE_PROVIDER_SITE_OTHER): Payer: Medicare Other

## 2019-09-25 ENCOUNTER — Encounter: Payer: Self-pay | Admitting: Nurse Practitioner

## 2019-09-25 ENCOUNTER — Other Ambulatory Visit: Payer: Self-pay

## 2019-09-25 ENCOUNTER — Ambulatory Visit (INDEPENDENT_AMBULATORY_CARE_PROVIDER_SITE_OTHER): Payer: Medicare Other | Admitting: Nurse Practitioner

## 2019-09-25 VITALS — BP 109/69 | HR 75

## 2019-09-25 DIAGNOSIS — M545 Low back pain: Secondary | ICD-10-CM

## 2019-09-25 DIAGNOSIS — W1800XA Striking against unspecified object with subsequent fall, initial encounter: Secondary | ICD-10-CM | POA: Diagnosis not present

## 2019-09-25 DIAGNOSIS — M546 Pain in thoracic spine: Secondary | ICD-10-CM | POA: Diagnosis not present

## 2019-09-25 DIAGNOSIS — W010XXA Fall on same level from slipping, tripping and stumbling without subsequent striking against object, initial encounter: Secondary | ICD-10-CM

## 2019-09-25 NOTE — Assessment & Plan Note (Signed)
Traumatic fall at home resulting in pain to thoracic and lumbar spine on the right side with large abrasion to the thoracic and lumbar spine on the right side as well. The patient does have significant tenderness, muscle spasms, and decreased range of motion in his back.  He also has significant difficulty getting up from the chair and sitting down while in the office today and is moving slow while walking.  It is unclear at this time how much of this is due to chronic spinal and lower extremity conditions and how much is due to the acute condition that he is experiencing. Will obtain x-rays of the thoracic and lumbar spine today to evaluate for worsening or new injury. Discussed with the patient's the importance of gentle exercises and moving around to help prevent stiffening of the muscles.  He does have baclofen on hand for muscle spasms recommend utilizing this.  Also recommend utilizing tramadol he has at home to help with the pain. Recommend icing the area for 20 minutes at a time at least 4 times a day and gentle stretching exercises once x-rays are cleared of any bony abnormality. Discussed with the patient that given the size of the abrasion it may take several weeks for this to fully heal. We will follow up based on x-ray results.  If x-rays are within normal limits plan for conservative treatment for the next 4 weeks.  If symptoms worsen or fail to improve return to see Dr. Darene Lamer for further evaluation. Recommend to the patient to avoid drinking more than 2 beers in one setting to prevent recurrence of such events.

## 2019-09-25 NOTE — Patient Instructions (Addendum)
We will get some x-rays today and look at the bones in your back to make sure that nothing is fractured.   If everything looks ok on the xray, this is likely a large bruise that will take time to heal. We will call you with the results of the x-ray  I would like you to put an ice pack on the area for 20 minutes at a time 4 times a day for the next 3 days.   You can take the Tramadol for pain.   Try not to be too still, as this can make you stiff and make the pain worse, but do not over do your activities to create more pain.
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
## 2019-09-28 NOTE — Progress Notes (Signed)
No fracture or damage to the bones from the fall visible on xray.   Continue with treatment for soft tissue injury. This will take some time to heal as it is a very large area of bruising.

## 2019-09-28 NOTE — Progress Notes (Signed)
X ray does not show any damage to the spine or bones from the fall.   There are some chronic changes that have been present for a while, but these are stable.   Continue with treatment of ice, heat, gentle stretching, and rest. This will take time to heal as it is a large hematoma, but the good new is nothing is broken.

## 2019-09-29 ENCOUNTER — Other Ambulatory Visit: Payer: Self-pay | Admitting: Family Medicine

## 2019-09-29 DIAGNOSIS — E1165 Type 2 diabetes mellitus with hyperglycemia: Secondary | ICD-10-CM

## 2019-10-02 ENCOUNTER — Telehealth: Payer: Self-pay

## 2019-10-02 NOTE — Telephone Encounter (Signed)
Pt called stating he didn't receive a call concerning his xray results.   Advised patient that per our notes we attempted to contact him 3 times and his voicemail was full.   Advised patient of result  Information. He expressed understanding.

## 2019-10-22 ENCOUNTER — Ambulatory Visit (HOSPITAL_COMMUNITY)
Admission: RE | Admit: 2019-10-22 | Discharge: 2019-10-22 | Disposition: A | Discharge status: Home / Self Care | Payer: Medicare Other | Attending: Psychiatry | Admitting: Psychiatry

## 2019-10-22 ENCOUNTER — Telehealth: Payer: Self-pay

## 2019-10-22 DIAGNOSIS — F329 Major depressive disorder, single episode, unspecified: Secondary | ICD-10-CM | POA: Insufficient documentation

## 2019-10-22 DIAGNOSIS — R45851 Suicidal ideations: Secondary | ICD-10-CM | POA: Insufficient documentation

## 2019-10-22 NOTE — Telephone Encounter (Signed)
Patient called asking for help, stating that he has been feeling very depressed, lonely, and desperate.   Patient reports for the last several days, he has had some suicidal thoughts. Denies any plan or means to do so. States "I don't think I have the guts to actually go through with it". Patient stated he has had some depression in the past, has not discussed it with anyone recently. Denies any recent life events that may have caused these feelings. Stated "I just feel lonely and desperate".   Patient was alone, stated no one was there with him, but he is willing to go to [facility] in clinic on [address]. Patient was given address and contact information for this clinic. Patient was agreeable that he was leaving his house to go to clinic as soon as we completed our call.   Patient was instructed to call us back or call the number provided for walk in clinic if any concerns or needs arise. Patient was very interested in getting help and did not have any suicidal thoughts or intents while on phone with me.   Advised patient's PCP of situation and also of plan for patient, he was agreeable.

## 2019-10-22 NOTE — H&P (Signed)
Behavioral Health Medical Screening Exam  Anthony [name]. is an 69 y.o. male.  Total Time spent with patient: 20 minutes  Psychiatric Specialty Exam: Physical Exam Constitutional:      General: He is not in acute distress.    Appearance: He is not ill-appearing or toxic-appearing.  Pulmonary:     Effort: Pulmonary effort is normal. No respiratory distress.  Neurological:     Mental Status: He is alert and oriented to person, place, and time.  Psychiatric:        Mood and Affect: Mood is depressed.        Speech: Speech normal.        Behavior: Behavior normal.        Thought Content: Thought content is not paranoid or delusional. Thought content does not include homicidal or suicidal ideation. Thought content does not include suicidal plan.    Review of Systems  Constitutional: Negative for chills, diaphoresis, fatigue and fever.  Respiratory: Negative for cough, chest tightness and shortness of breath.   Cardiovascular: Negative for chest pain and palpitations.  Gastrointestinal: Negative for diarrhea, nausea and vomiting.  Neurological: Negative for dizziness.  Psychiatric/Behavioral: Positive for dysphoric mood, sleep disturbance and suicidal ideas. Negative for hallucinations and self-injury. The patient is nervous/anxious.    Blood pressure 123/71, pulse 67, temperature 98.7 F (37.1 C), temperature source Oral.There is no height or weight on file to calculate BMI. General Appearance: Casual and Well Groomed Eye Contact:  Good Speech:  Clear and Coherent and Normal Rate Volume:  Normal Mood:  Anxious and Depressed Affect:  Congruent and Depressed Thought Process:  Coherent, Goal Directed, Linear and Descriptions of Associations: Intact Orientation:  Full (Time, Place, and Person) Thought Content:  Logical Suicidal Thoughts:  Yes.  without intent/plan Homicidal Thoughts:  No Memory:  Immediate;   Good Recent;   Good Remote;   Good Judgement:  Fair Insight:   Fair Psychomotor Activity:  Normal Concentration: Concentration: Good and Attention Span: Good Recall:  Good Fund of Knowledge:Good Language: Good Akathisia:  Negative Handed:  Right AIMS (if indicated):    Assets:  Communication Skills Desire for Improvement Financial Resources/Insurance Housing Physical Health Transportation Sleep:      Blood pressure 123/71, pulse 67, temperature 98.7 F (37.1 C), temperature source Oral.  Recommendations: Based on my evaluation the patient does not appear to have an emergency medical condition.   Disposition: No evidence of imminent risk to self or others at present.   Patient does not meet criteria for psychiatric inpatient admission. Supportive therapy provided about ongoing stressors. Discussed crisis plan, support from social network, calling 911, coming to the Emergency Department, and calling Suicide Hotline.  Discussed continuous assessment at [facility]. Patient states that he would prefer to go home. He adamantly denies any suicidal intent or specific plan. Denies current SI. He agrees to follow up for outpatient treatment. Patient verbalizes understanding and agrees to contact 911, call suicide hotline, return to [facility], go to [facility], contact family/friends, or go to the nearest ED if his condition continues to worsen or he develops active suicidal ideations or suicidal intent/plan.  Rozetta Nunnery, NP 10/23/2019, 12:21 AM

## 2019-10-22 NOTE — BH Assessment (Addendum)
Assessment Note  Anthony [name]. is an 69 y.o. male.  -Patient had contacted [facility] in [place] about getting help with his depression and SI.  He was given address to [facility].  Patient drove himself and presents unaccompanied.  Patient says that he has been feeling very depressed and has had SI for the last 3-4 weeks.  He reports feeling lethargic, no interest in things he used to find enjoyable, not being able to get out of bed, decreased sleep.  Pt reports no antecedent events for this depression.  Patient says he has been thinking about suicide "all the time."  He says however that he has no real intention "I know I would not do it."  Patient denies any plan or any previous attempts.  Patient denies any HI or A/V hallucinations.  He does report that he drank a lot of beer a few weeks ago and fell down at home.  He says he drinks every few months at most.  No other substances used.  Patient has a flat affect and is oriented x4.  He is not responding to internal stimuli.  Patient is not engaged in delusional thinking and his thinking process is coherent and logical.  Patient says he goes to sleep later at night and wakes up around noon.  He may get about 6 hours of sleep.  Patient has not had any change in appetite.  Pt is a poor historian for his medications.    Patient says he had previus inpatient care back in the early 1970's.  He said that last time he had outpatient services was in the late "80's.    -Patient was seen by [facility] for his MSE.  Patient said that he could maintain safety at home and wanted to return there.  Corene Cornea said that patient was okay to return home and should be given outpatient resources.  Clinician gave him the contact information for [facility] outpatient services in [place] to follow up.  Diagnosis: MDD recurrent, moderate  Past Medical History:  Past Medical History:  Diagnosis Date  . Anxiety   . Arthritis   . Bipolar disorder ([place])    . CAD (coronary artery disease)    a. INF STEMI 07/04/10:  tx with thrombectomy + [facility] BMS to [facility];  cath 07/04/10: dLM 10-20%, pLAD 40-50%, mLAD 20-30%, pRCA 30%, mRCA occluded and tx with PCI, EF 50% with inf HK. A Multilink  . COPD (chronic obstructive pulmonary disease) ([place])   . Depression   . Glucose intolerance (impaired glucose tolerance)    A1c 6.2 06/2010  . History of DVT (deep vein thrombosis)    traumatic, s/p coumadin tx.  Marland Kitchen HLD (hyperlipidemia)   . Hypertension    pt states is currently on no medications  . Myocardial infarction ([place])    around 2013  . Shortness of breath    walking distance or climbing stairs  . Tobacco abuse   . Urinary frequency   . Vitamin D deficiency 09/05/2018    Past Surgical History:  Procedure Laterality Date  . CARDIAC CATHETERIZATION    . COLONOSCOPY N/A 12/29/2015   Procedure: COLONOSCOPY;  Surgeon: Rogene Houston, MD;  [facility];  Service: Endoscopy;  Laterality: N/A;  12:55  . ELECTROCARDIOGRAM     Showed inferolateral ST-elevation and a code STEMI was activated. In the ER, he was treated with morphine, herarin, and 600 mg of Plavix. He was transferred emergently to [facility].   Marland Kitchen  INGUINAL HERNIA REPAIR Left 07/26/2014   Procedure: OPEN REPAIR OF RECURENT LEFT INGUINAL HERNIA REPAIR WITH MESH;  Surgeon: Greer Pickerel, MD;  [facility];  Service: General;  Laterality: Left;  . INSERTION OF MESH Bilateral 10/30/2013   Procedure: INSERTION OF MESH;  Surgeon: Gayland Curry, MD;  [facility];  Service: General;  Laterality: Bilateral;  . LAPAROSCOPIC INGUINAL HERNIA WITH UMBILICAL HERNIA Bilateral 10/30/2013   Procedure: laparoscopic repair left pantaloom hernia with mesh, laparoscopic right inguinal hernia with mesh, OPEN REPAIR OF UMBILICAL HERNIA ;  Surgeon: Gayland Curry, MD;  [facility];  Service: General;  Laterality: Bilateral;  . LAPAROSCOPY N/A 07/26/2014   Procedure: LAPAROSCOPY DIAGNOSTIC;   Surgeon: Greer Pickerel, MD;  [facility];  Service: General;  Laterality: N/A;  . POLYPECTOMY  12/29/2015   Procedure: POLYPECTOMY;  Surgeon: Rogene Houston, MD;  [facility];  Service: Endoscopy;;  ascending colon, descending colon  . stent placement    . TOTAL HIP ARTHROPLASTY Right 05/10/2015   Procedure: RIGHT TOTAL HIP ARTHROPLASTY ANTERIOR APPROACH;  Surgeon: Paralee Cancel, MD;  [facility];  Service: Orthopedics;  Laterality: Right;  Marland Kitchen VASECTOMY      Family History:  Family History  Problem Relation Age of Onset  . Coronary artery disease Father   . Depression Father   . Heart attack Father   . Depression Mother     Social History:  reports that he has been smoking cigarettes. He has a 67.50 pack-year smoking history. He has never used smokeless tobacco. He reports that he does not drink alcohol and does not use drugs.  Additional Social History:  Alcohol / Drug Use Pain Medications: None Prescriptions: Atorvastatin.  Other medications he has but cannot recall what they are. Over the Counter: Alleve (as needed) usually twice a day. History of alcohol / drug use?: No history of alcohol / drug abuse  CIWA:   COWS:    Allergies:  Allergies  Allergen Reactions  . Flomax [Tamsulosin Hcl] Other (See Comments)    This medication makes patient feel sick  . Penicillins Other (See Comments)    Reaction unknown occurred during childhood Has patient had a PCN reaction causing immediate rash, facial/tongue/throat swelling, SOB or lightheadedness with hypotension: unsure - childhood reaction Has patient had a PCN reaction causing severe rash involving mucus membranes or skin necrosis: unsure - childhood reaction Has patient had a PCN reaction that required hospitalization no Has patient had a PCN reaction occurring within the last 10 years: no If all of the above answers are "NO", then may p    Home Medications: (Not in a hospital admission)   OB/GYN Status:   No LMP for male patient.  General Assessment Data Location of Assessment:  [facility]) TTS Assessment: In system Is this a Tele or Face-to-Face Assessment?: Face-to-Face Is this an Initial Assessment or a Re-assessment for this encounter?: Initial Assessment Patient Accompanied by:: N/A Language Other than English: No Living Arrangements: Other (Comment) (Lives alone.) What gender do you identify as?: Male Marital status: Divorced Pregnancy Status: No Living Arrangements: Alone Can pt return to current living arrangement?: Yes Admission Status: Voluntary Is patient capable of signing voluntary admission?: Yes Referral Source: Self/Family/Friend Insurance type: MCR  Medical Screening Exam ([place]) Medical Exam completed: Yes Lindon Romp, FNP)  [place] Arrangements: Alone Name of Psychiatrist: None Name of Therapist: None  Education Status Is patient currently in school?: No Is the patient  employed, unemployed or receiving disability?: Unemployed (Retired)  Risk to self with the past 6 months Suicidal Ideation: Yes-Currently Present Has patient been a risk to self within the past 6 months prior to admission? : No Suicidal Intent: No Has patient had any suicidal intent within the past 6 months prior to admission? : No Is patient at risk for suicide?: No Suicidal Plan?: No Has patient had any suicidal plan within the past 6 months prior to admission? : No Access to Means: No What has been your use of drugs/alcohol within the last 12 months?: None Previous Attempts/Gestures: No How many times?: 0 Other Self Harm Risks: None Triggers for Past Attempts: None known Intentional Self Injurious Behavior: None Family Suicide History: No Recent stressful life event(s): Other (Comment) (No antecedents) Persecutory voices/beliefs?: No Depression: Yes Depression Symptoms: Despondent, Insomnia, Loss of interest in usual pleasures, Feeling worthless/self  pity Substance abuse history and/or treatment for substance abuse?: No Suicide prevention information given to non-admitted patients: Not applicable  Risk to Others within the past 6 months Homicidal Ideation: No Does patient have any lifetime risk of violence toward others beyond the six months prior to admission? : No Thoughts of Harm to Others: No Current Homicidal Intent: No Current Homicidal Plan: No Access to Homicidal Means: No Identified Victim: No one History of harm to others?: No Assessment of Violence: None Noted Violent Behavior Description: None reported Does patient have access to weapons?: No Criminal Charges Pending?: No Does patient have a court date: No Is patient on probation?: No  Psychosis Hallucinations: None noted Delusions: None noted  Mental Status Report Appearance/Hygiene: Disheveled Eye Contact: Good Motor Activity: Freedom of movement, Unremarkable Speech: Logical/coherent Level of Consciousness: Alert Mood: Depressed, Sad Affect: Depressed Anxiety Level: None Thought Processes: Coherent, Relevant Judgement: Unimpaired Orientation: Person, Place, Situation, Time Obsessive Compulsive Thoughts/Behaviors: None  Cognitive Functioning Concentration: Decreased Memory: Recent Impaired, Remote Intact Is patient IDD: No Insight: Fair Impulse Control: Good Appetite: Good Have you had any weight changes? : No Change Sleep: Decreased Total Hours of Sleep:  (<4 hours.  Sleeping early in AM and waking around noon.) Vegetative Symptoms: Decreased grooming, Staying in bed  ADLScreening [facility] Assessment Services) Patient's cognitive ability adequate to safely complete daily activities?: Yes Patient able to express need for assistance with ADLs?: Yes Independently performs ADLs?: Yes (appropriate for developmental age)  Prior Inpatient Therapy Prior Inpatient Therapy: Yes Prior Therapy Dates: Early '70's Prior Therapy Facilty/Provider(s): Facility  in [place] Reason for Treatment: impulsive behavior  Prior Outpatient Therapy Prior Outpatient Therapy: Yes Prior Therapy Dates: Late "80's Prior Therapy Facilty/Provider(s): Dr. Michaelle Birks  Reason for Treatment: therapist Does patient have an ACCT team?: No Does patient have Intensive In-House Services?  : No Does patient have [facility] services? : No Does patient have P4CC services?: No  ADL Screening (condition at time of admission) Patient's cognitive ability adequate to safely complete daily activities?: Yes Is the patient deaf or have difficulty hearing?: No Does the patient have difficulty seeing, even when wearing glasses/contacts?: No Does the patient have difficulty concentrating, remembering, or making decisions?: No Patient able to express need for assistance with ADLs?: Yes Does the patient have difficulty dressing or bathing?: Yes (Lack of motivation.) Independently performs ADLs?: Yes (appropriate for developmental age) Does the patient have difficulty walking or climbing stairs?: No Weakness of Legs: None Weakness of Arms/Hands: None  Home Assistive Devices/Equipment Home Assistive Devices/Equipment: None    Abuse/Neglect Assessment (Assessment to be complete while patient is alone) Abuse/Neglect  Assessment Can Be Completed: Yes Physical Abuse: Denies Verbal Abuse: Denies Sexual Abuse: Denies Exploitation of patient/patient's resources: Denies Self-Neglect: Denies     Regulatory affairs officer (For Healthcare) Does Patient Have a Medical Advance Directive?: No Would patient like information on creating a medical advance directive?: No - Patient declined          Disposition:  Disposition Initial Assessment Completed for this Encounter: Yes  On Site Evaluation by:   Reviewed with Physician:    Curlene Dolphin Ray 10/22/2019 8:04 PM

## 2019-10-30 ENCOUNTER — Other Ambulatory Visit: Payer: Self-pay

## 2019-10-30 ENCOUNTER — Ambulatory Visit (INDEPENDENT_AMBULATORY_CARE_PROVIDER_SITE_OTHER): Payer: Medicare Other | Admitting: Family Medicine

## 2019-10-30 ENCOUNTER — Encounter: Payer: Self-pay | Admitting: Family Medicine

## 2019-10-30 VITALS — BP 103/54 | HR 63 | Temp 98.0°F | Wt 190.3 lb

## 2019-10-30 DIAGNOSIS — M791 Myalgia, unspecified site: Secondary | ICD-10-CM

## 2019-10-30 DIAGNOSIS — R748 Abnormal levels of other serum enzymes: Secondary | ICD-10-CM | POA: Diagnosis not present

## 2019-10-30 DIAGNOSIS — R109 Unspecified abdominal pain: Secondary | ICD-10-CM

## 2019-10-30 DIAGNOSIS — E1169 Type 2 diabetes mellitus with other specified complication: Secondary | ICD-10-CM

## 2019-10-30 DIAGNOSIS — F321 Major depressive disorder, single episode, moderate: Secondary | ICD-10-CM | POA: Diagnosis not present

## 2019-10-30 DIAGNOSIS — R5382 Chronic fatigue, unspecified: Secondary | ICD-10-CM

## 2019-10-30 DIAGNOSIS — E785 Hyperlipidemia, unspecified: Secondary | ICD-10-CM | POA: Diagnosis not present

## 2019-10-30 DIAGNOSIS — F5101 Primary insomnia: Secondary | ICD-10-CM | POA: Diagnosis not present

## 2019-10-30 MED ORDER — BACLOFEN 10 MG PO TABS: 10.0000 mg | ORAL_TABLET | Freq: Two times a day (BID) | ORAL | 0 refills | Status: DC

## 2019-10-30 NOTE — Patient Instructions (Addendum)
Reduce lisionpril to 1/2 tab (2.5mg ) I have entered a referral to psychiatry.  Have labs completed.  You can try baclofen twice daily as needed to help with pain.

## 2019-11-01 ENCOUNTER — Encounter: Payer: Self-pay | Admitting: Family Medicine

## 2019-11-01 DIAGNOSIS — R109 Unspecified abdominal pain: Secondary | ICD-10-CM | POA: Insufficient documentation

## 2019-11-01 NOTE — Assessment & Plan Note (Signed)
Likely multifactorial.  Will reduce lisinopril as his BP is a little low.  Update labs.  Poor sleep-will refer to psychiatry for continued insomnia and depressive symptoms.  Denies SI at this time.

## 2019-11-01 NOTE — Assessment & Plan Note (Signed)
Check LDL. 

## 2019-11-01 NOTE — Progress Notes (Signed)
[name] - 69 y.o. male MRN [id number]  Date of birth: [birth year]  Subjective Chief Complaint  Patient presents with  . Fatigue  . Pain    HPI Anthony [name]. is a 69 y.o. male here today with complaint of fatigue and pain.    Reports increased fatigue over the past several weeks.  Has felt some depressive symptoms.  Evaluated at walk-in psychiatry clinic, denied SI and did not meet criteria for inpatient admission.  He is feeling better in regards to this.  He reports having increased difficulty with sleeping again.  He denies any other symptoms including fever, chest pain, or shortness of breath.   He also reports increased pain along the R mid back area..  Had fall a few weeks ago.  Negative xrays but extensive bruising.  He has tried anti-inflammatories and icing.  He also has tramadol, which helps temporarily.  He denies radiation or pain, numbness, or tingling.    ROS:  A comprehensive ROS was completed and negative except as noted per HPI  Allergies  Allergen Reactions  . Flomax [Tamsulosin Hcl] Other (See Comments)    This medication makes patient feel sick  . Penicillins Other (See Comments)    Reaction unknown occurred during childhood Has patient had a PCN reaction causing immediate rash, facial/tongue/throat swelling, SOB or lightheadedness with hypotension: unsure - childhood reaction Has patient had a PCN reaction causing severe rash involving mucus membranes or skin necrosis: unsure - childhood reaction Has patient had a PCN reaction that required hospitalization no Has patient had a PCN reaction occurring within the last 10 years: no If all of the above answers are "NO", then may p    Past Medical History:  Diagnosis Date  . Anxiety   . Arthritis   . Bipolar disorder ([place])   . CAD (coronary artery disease)    a. INF STEMI 07/04/10:  tx with thrombectomy + [facility] BMS to [facility];  cath 07/04/10: dLM 10-20%, pLAD 40-50%, mLAD 20-30%, pRCA 30%, mRCA occluded  and tx with PCI, EF 50% with inf HK. A Multilink  . COPD (chronic obstructive pulmonary disease) ([place])   . Depression   . Glucose intolerance (impaired glucose tolerance)    A1c 6.2 06/2010  . History of DVT (deep vein thrombosis)    traumatic, s/p coumadin tx.  Marland Kitchen HLD (hyperlipidemia)   . Hypertension    pt states is currently on no medications  . Myocardial infarction ([place])    around 2013  . Shortness of breath    walking distance or climbing stairs  . Tobacco abuse   . Urinary frequency   . Vitamin D deficiency 09/05/2018    Past Surgical History:  Procedure Laterality Date  . CARDIAC CATHETERIZATION    . COLONOSCOPY N/A 12/29/2015   Procedure: COLONOSCOPY;  Surgeon: Rogene Houston, MD;  [facility];  Service: Endoscopy;  Laterality: N/A;  12:55  . ELECTROCARDIOGRAM     Showed inferolateral ST-elevation and a code STEMI was activated. In the ER, he was treated with morphine, herarin, and 600 mg of Plavix. He was transferred emergently to [facility].   . INGUINAL HERNIA REPAIR Left 07/26/2014   Procedure: OPEN REPAIR OF RECURENT LEFT INGUINAL HERNIA REPAIR WITH MESH;  Surgeon: Greer Pickerel, MD;  [facility];  Service: General;  Laterality: Left;  . INSERTION OF MESH Bilateral 10/30/2013   Procedure: INSERTION OF MESH;  Surgeon: Gayland Curry, MD;  Location: [facility];  Service: General;  Laterality: Bilateral;  . LAPAROSCOPIC INGUINAL HERNIA WITH UMBILICAL HERNIA Bilateral 10/30/2013   Procedure: laparoscopic repair left pantaloom hernia with mesh, laparoscopic right inguinal hernia with mesh, OPEN REPAIR OF UMBILICAL HERNIA ;  Surgeon: Gayland Curry, MD;  [facility];  Service: General;  Laterality: Bilateral;  . LAPAROSCOPY N/A 07/26/2014   Procedure: LAPAROSCOPY DIAGNOSTIC;  Surgeon: Greer Pickerel, MD;  [facility];  Service: General;  Laterality: N/A;  . POLYPECTOMY  12/29/2015   Procedure: POLYPECTOMY;  Surgeon: Rogene Houston, MD;  [facility];  Service: Endoscopy;;  ascending colon, descending colon  . stent placement    . TOTAL HIP ARTHROPLASTY Right 05/10/2015   Procedure: RIGHT TOTAL HIP ARTHROPLASTY ANTERIOR APPROACH;  Surgeon: Paralee Cancel, MD;  [facility];  Service: Orthopedics;  Laterality: Right;  Marland Kitchen VASECTOMY      Social History   Socioeconomic History  . Marital status: Divorced    Spouse name: Not on file  . Number of children: Not on file  . Years of education: Not on file  . Highest education level: Not on file  Occupational History  . Not on file  Tobacco Use  . Smoking status: Light Tobacco Smoker    Packs/day: 1.50    Years: 45.00    Pack years: 67.50    Types: Cigarettes  . Smokeless tobacco: Never Used  Vaping Use  . Vaping Use: Never used  Substance and Sexual Activity  . Alcohol use: No    Comment: past hx of ETOH use was in inpt facility per court order  - 20 years, 10-18-2015 per pt not anymore,per pt stopped about 15-20 yrs ago  . Drug use: No    Types: Marijuana, Heroin, Cocaine    Comment: snorted heroin and cocaine, 10-18-2015 per pt in the past he did Marijuana only and nothing else  . Sexual activity: Not Currently    Partners: Female  Other Topics Concern  . Not on file  Social History Narrative   Lives in [place]. He lives alone. He has kids but is not married.    Social Determinants of Health   Financial Resource Strain:   . Difficulty of Paying Living Expenses: Not on file  Food Insecurity:   . Worried About Charity fundraiser in the Last Year: Not on file  . Ran Out of Food in the Last Year: Not on file  Transportation Needs:   . Lack of Transportation (Medical): Not on file  . Lack of Transportation (Non-Medical): Not on file  Physical Activity:   . Days of Exercise per Week: Not on file  . Minutes of Exercise per Session: Not on file  Stress:   . Feeling of Stress : Not on file  Social Connections:   . Frequency of Communication with Friends and  Family: Not on file  . Frequency of Social Gatherings with Friends and Family: Not on file  . Attends Religious Services: Not on file  . Active Member of Clubs or Organizations: Not on file  . Attends Archivist Meetings: Not on file  . Marital Status: Not on file    Family History  Problem Relation Age of Onset  . Coronary artery disease Father   . Depression Father   . Heart attack Father   . Depression Mother     Health Maintenance  Topic Date Due  . COVID-19 Vaccine (1) Never done  . PNA vac Low Risk Adult (  1 of 2 - PCV13) Never done  . OPHTHALMOLOGY EXAM  03/18/2019  . HEMOGLOBIN A1C  09/26/2019  . FOOT EXAM  10/21/2019  . INFLUENZA VACCINE  04/07/2020 (Originally 08/09/2019)  . COLONOSCOPY  12/28/2025  . TETANUS/TDAP  12/08/2026  . Hepatitis C Screening  Completed     ----------------------------------------------------------------------------------------------------------------------------------------------------------------------------------------------------------------- Physical Exam BP (!) 103/54 (BP Location: Left Arm, Patient Position: Sitting, Cuff Size: Normal)   Pulse 63   Temp 98 F (36.7 C)   Wt 190 lb 4.8 oz (86.3 kg)   SpO2 93%   BMI 28.19 kg/m   Physical Exam Constitutional:      Appearance: Normal appearance.  HENT:     Head: Normocephalic and atraumatic.  Eyes:     General: No scleral icterus. Cardiovascular:     Rate and Rhythm: Normal rate and regular rhythm.  Pulmonary:     Effort: Pulmonary effort is normal.     Breath sounds: Normal breath sounds.     Comments: TTP along R flank area.  No bruising noted.  Musculoskeletal:     Cervical back: Neck supple.  Skin:    General: Skin is warm and dry.  Neurological:     General: No focal deficit present.     Mental Status: He is alert.  Psychiatric:        Mood and Affect: Mood normal.        Behavior: Behavior normal.      ------------------------------------------------------------------------------------------------------------------------------------------------------------------------------------------------------------------- Assessment and Plan  Chronic fatigue Likely multifactorial.  Will reduce lisinopril as his BP is a little low.  Update labs.  Poor sleep-will refer to psychiatry for continued insomnia and depressive symptoms.  Denies SI at this time.   Hyperlipidemia associated with type 2 diabetes mellitus (HCC) Check LDL  Right flank pain Continued pain from recent fall.  Previous xrays negative.  Adding baclofen on in addition to tramadol.     Meds ordered this encounter  Medications  . baclofen (LIORESAL) 10 MG tablet    Sig: Take 1 tablet (10 mg total) by mouth 2 (two) times daily.    Dispense:  180 tablet    Refill:  0   Orders Placed This Encounter  Procedures  . COMPLETE METABOLIC PANEL WITH GFR  . CBC  . Direct LDL  . TSH  . B12  . Ambulatory referral to Psychiatry    Referral Priority:   Routine    Referral Type:   Psychiatric    Referral Reason:   Specialty Services Required    Requested Specialty:   Psychiatry    Number of Visits Requested:   1     Return in about 4 weeks (around 11/27/2019) for Fatigue/Depression.    This visit occurred during the SARS-CoV-2 public health emergency.  Safety protocols were in place, including screening questions prior to the visit, additional usage of staff PPE, and extensive cleaning of exam room while observing appropriate contact time as indicated for disinfecting solutions.

## 2019-11-01 NOTE — Assessment & Plan Note (Signed)
Continued pain from recent fall.  Previous xrays negative.  Adding baclofen on in addition to tramadol.

## 2019-11-06 LAB — COMPLETE METABOLIC PANEL WITH GFR
AG Ratio: 1.4 (calc) (ref 1.0–2.5)
ALT: 13 U/L (ref 9–46)
AST: 12 U/L (ref 10–35)
Albumin: 4.2 g/dL (ref 3.6–5.1)
Alkaline phosphatase (APISO): 77 U/L (ref 35–144)
BUN: 14 mg/dL (ref 7–25)
CO2: 30 mmol/L (ref 20–32)
Calcium: 9.7 mg/dL (ref 8.6–10.3)
Chloride: 101 mmol/L (ref 98–110)
Creat: 1.03 mg/dL (ref 0.70–1.18)
GFR, Est African American: 84 mL/min/{1.73_m2} (ref >=60)
GFR, Est Non African American: 73 mL/min/{1.73_m2} (ref >=60)
Globulin: 2.9 g/dL (calc) (ref 1.9–3.7)
Glucose, Bld: 228 mg/dL — ABNORMAL HIGH (ref 65–139)
Potassium: 4.7 mmol/L (ref 3.5–5.3)
Sodium: 138 mmol/L (ref 135–146)
Total Bilirubin: 0.4 mg/dL (ref 0.2–1.2)
Total Protein: 7.1 g/dL (ref 6.1–8.1)

## 2019-11-06 LAB — CBC
HCT: 50.6 % — ABNORMAL HIGH (ref 38.5–50.0)
Hemoglobin: 17 g/dL (ref 13.2–17.1)
MCH: 30.9 pg (ref 27.0–33.0)
MCHC: 33.6 g/dL (ref 32.0–36.0)
MCV: 92 fL (ref 80.0–100.0)
MPV: 9.2 fL (ref 7.5–12.5)
Platelets: 203 10*3/uL (ref 140–400)
RBC: 5.5 10*6/uL (ref 4.20–5.80)
RDW: 11.8 % (ref 11.0–15.0)
WBC: 9.2 10*3/uL (ref 3.8–10.8)

## 2019-11-06 LAB — LDL CHOLESTEROL, DIRECT: Direct LDL: 106 mg/dL — ABNORMAL HIGH (ref <100)

## 2019-11-06 LAB — TSH: TSH: 2.41 mIU/L (ref 0.40–4.50)

## 2019-11-06 LAB — VITAMIN B12: Vitamin B-12: 417 pg/mL (ref 200–1100)

## 2019-11-06 LAB — HEMOGLOBIN A1C W/OUT EAG: Hgb A1c MFr Bld: 7.2 % of total Hgb — ABNORMAL HIGH (ref <5.7)

## 2019-11-16 ENCOUNTER — Other Ambulatory Visit: Payer: Self-pay | Admitting: Osteopathic Medicine

## 2019-11-16 DIAGNOSIS — M791 Myalgia, unspecified site: Secondary | ICD-10-CM

## 2019-11-16 MED ORDER — METOPROLOL SUCCINATE ER 25 MG PO TB24: 12.5000 mg | ORAL_TABLET | Freq: Every day | ORAL | 3 refills | Status: DC

## 2019-11-20 ENCOUNTER — Other Ambulatory Visit: Payer: Self-pay | Admitting: Family Medicine

## 2019-11-26 ENCOUNTER — Ambulatory Visit: Payer: Medicare Other | Admitting: Family Medicine

## 2019-11-27 ENCOUNTER — Encounter: Payer: Self-pay | Admitting: Family Medicine

## 2019-11-27 ENCOUNTER — Other Ambulatory Visit: Payer: Self-pay

## 2019-11-27 ENCOUNTER — Ambulatory Visit (INDEPENDENT_AMBULATORY_CARE_PROVIDER_SITE_OTHER): Payer: Medicare Other | Admitting: Family Medicine

## 2019-11-27 VITALS — BP 121/64 | HR 61 | Wt 199.6 lb

## 2019-11-27 DIAGNOSIS — F331 Major depressive disorder, recurrent, moderate: Secondary | ICD-10-CM | POA: Diagnosis not present

## 2019-11-27 MED ORDER — FLUOXETINE HCL 20 MG PO CAPS: 20.0000 mg | ORAL_CAPSULE | Freq: Every day | ORAL | 3 refills | Status: DC

## 2019-11-27 NOTE — Patient Instructions (Signed)
Start fluoxetine daily.  Referral entered for therapist.  Return call to schedule with psychiatry.  See me again in 4-6 weeks.

## 2019-11-29 ENCOUNTER — Encounter: Payer: Self-pay | Admitting: Family Medicine

## 2019-11-29 NOTE — Progress Notes (Signed)
Anthony [name]. - 69 y.o. male MRN [id number]  Date of birth: [birth year]  Subjective Chief Complaint  Patient presents with  . Depression    HPI Anthony [name]. is a 69 y.o. male here today for follow up of depression and insomnia.  He reports that he continues to feel depressed and isolated.  He had been referred to psychiatry however they have been unable to get in touch with him.  He states that he has gotten calls but hasn't called back yet.  He is sleeping better but doesn't feel motivated to get up and do anything.  He spends a large portion of time laying around at home.  He would be interested in seeing a therapist and adding additional medication.  He did circle a "2" for question 9 on phq however he denies SI and this is more of a feeling that he doesn't care whether he lives or dies.   Depression screen [facility] 2022-03-01 11/27/2019 09/23/2018 09/08/2018  Decreased Interest 3 3 1   Down, Depressed, Hopeless 2 2 1   PHQ - 2 Score 5 5 2   Altered sleeping 3 2 3   Tired, decreased energy 3 2 3   Change in appetite 2 2 3   Feeling bad or failure about yourself  2 2 3   Trouble concentrating 3 2 2   Moving slowly or fidgety/restless 2 0 0  Suicidal thoughts 2 2 1   PHQ-9 Score 22 17 17   Difficult doing work/chores Somewhat difficult Very difficult -  Some recent data might be hidden   GAD 7 : Generalized Anxiety Score 11/27/2019 09/23/2018 09/08/2018 08/29/2018  Nervous, Anxious, on Edge 2 2 2 3   Control/stop worrying 2 1 1 3   Worry too much - different things 2 1 1 3   Trouble relaxing 2 1 0 3  Restless 2 1 2 2   Easily annoyed or irritable 1 2 1 2   Afraid - awful might happen 2 1 2 2   Total GAD 7 Score 13 9 9 18   Anxiety Difficulty Somewhat difficult Very difficult - Very difficult     Allergies  Allergen Reactions  . Flomax [Tamsulosin Hcl] Other (See Comments)    This medication makes patient feel sick  . Penicillins Other (See Comments)    Reaction unknown occurred during  childhood Has patient had a PCN reaction causing immediate rash, facial/tongue/throat swelling, SOB or lightheadedness with hypotension: unsure - childhood reaction Has patient had a PCN reaction causing severe rash involving mucus membranes or skin necrosis: unsure - childhood reaction Has patient had a PCN reaction that required hospitalization no Has patient had a PCN reaction occurring within the last 10 years: no If all of the above answers are "NO", then may p    Past Medical History:  Diagnosis Date  . Anxiety   . Arthritis   . Bipolar disorder ([place])   . CAD (coronary artery disease)    a. INF STEMI 07/04/10:  tx with thrombectomy + [facility] to [facility];  cath 07/04/10: dLM 10-20%, pLAD 40-50%, mLAD 20-30%, pRCA 30%, mRCA occluded and tx with PCI, EF 50% with inf HK. A Multilink  . COPD (chronic obstructive pulmonary disease) ([place])   . Depression   . Glucose intolerance (impaired glucose tolerance)    A1c 6.2 06/2010  . History of DVT (deep vein thrombosis)    traumatic, s/p coumadin tx.  Marland Kitchen HLD (hyperlipidemia)   . Hypertension    pt states is currently on no medications  . Myocardial infarction ([place])  around 2013  . Shortness of breath    walking distance or climbing stairs  . Tobacco abuse   . Urinary frequency   . Vitamin D deficiency 09/05/2018    Past Surgical History:  Procedure Laterality Date  . CARDIAC CATHETERIZATION    . COLONOSCOPY N/A 12/29/2015   Procedure: COLONOSCOPY;  Surgeon: Rogene Houston, MD;  Location: [facility];  Service: Endoscopy;  Laterality: N/A;  12:55  . ELECTROCARDIOGRAM     Showed inferolateral ST-elevation and a code STEMI was activated. In the ER, he was treated with morphine, herarin, and 600 mg of Plavix. He was transferred emergently to [facility].   . INGUINAL HERNIA REPAIR Left 07/26/2014   Procedure: OPEN REPAIR OF RECURENT LEFT INGUINAL HERNIA REPAIR WITH MESH;  Surgeon: Greer Pickerel, MD;  Location: [facility];  Service:  General;  Laterality: Left;  . INSERTION OF MESH Bilateral 10/30/2013   Procedure: INSERTION OF MESH;  Surgeon: Gayland Curry, MD;  Location: [facility];  Service: General;  Laterality: Bilateral;  . LAPAROSCOPIC INGUINAL HERNIA WITH UMBILICAL HERNIA Bilateral 10/30/2013   Procedure: laparoscopic repair left pantaloom hernia with mesh, laparoscopic right inguinal hernia with mesh, OPEN REPAIR OF UMBILICAL HERNIA ;  Surgeon: Gayland Curry, MD;  Location: [facility];  Service: General;  Laterality: Bilateral;  . LAPAROSCOPY N/A 07/26/2014   Procedure: LAPAROSCOPY DIAGNOSTIC;  Surgeon: Greer Pickerel, MD;  Location: [facility];  Service: General;  Laterality: N/A;  . POLYPECTOMY  12/29/2015   Procedure: POLYPECTOMY;  Surgeon: Rogene Houston, MD;  [facility];  Service: Endoscopy;;  ascending colon, descending colon  . stent placement    . TOTAL HIP ARTHROPLASTY Right 05/10/2015   Procedure: RIGHT TOTAL HIP ARTHROPLASTY ANTERIOR APPROACH;  Surgeon: Paralee Cancel, MD;  Location: [facility];  Service: Orthopedics;  Laterality: Right;  Marland Kitchen VASECTOMY      Social History   Socioeconomic History  . Marital status: Divorced    Spouse name: Not on file  . Number of children: Not on file  . Years of education: Not on file  . Highest education level: Not on file  Occupational History  . Not on file  Tobacco Use  . Smoking status: Light Tobacco Smoker    Packs/day: 1.50    Years: 45.00    Pack years: 67.50    Types: Cigarettes  . Smokeless tobacco: Never Used  Vaping Use  . Vaping Use: Never used  Substance and Sexual Activity  . Alcohol use: No    Comment: past hx of ETOH use was in inpt facility per court order  - 20 years, 10-18-2015 per pt not anymore,per pt stopped about 15-20 yrs ago  . Drug use: No    Types: Marijuana, Heroin, Cocaine    Comment: snorted heroin and cocaine, 10-18-2015 per pt in the past he did Marijuana only and nothing else  . Sexual activity: Not Currently    Partners: Female   Other Topics Concern  . Not on file  Social History Narrative   Lives in [place]. He lives alone. He has kids but is not married.    Social Determinants of Health   Financial Resource Strain:   . Difficulty of Paying Living Expenses: Not on file  Food Insecurity:   . Worried About Charity fundraiser in the Last Year: Not on file  . Ran Out of Food in the Last Year: Not on file  Transportation Needs:   . Lack of  Transportation (Medical): Not on file  . Lack of Transportation (Non-Medical): Not on file  Physical Activity:   . Days of Exercise per Week: Not on file  . Minutes of Exercise per Session: Not on file  Stress:   . Feeling of Stress : Not on file  Social Connections:   . Frequency of Communication with Friends and Family: Not on file  . Frequency of Social Gatherings with Friends and Family: Not on file  . Attends Religious Services: Not on file  . Active Member of Clubs or Organizations: Not on file  . Attends Archivist Meetings: Not on file  . Marital Status: Not on file    Family History  Problem Relation Age of Onset  . Coronary artery disease Father   . Depression Father   . Heart attack Father   . Depression Mother     Health Maintenance  Topic Date Due  . COVID-19 Vaccine (1) Never done  . PNA vac Low Risk Adult (1 of 2 - PCV13) Never done  . OPHTHALMOLOGY EXAM  03/18/2019  . FOOT EXAM  10/21/2019  . INFLUENZA VACCINE  04/07/2020 (Originally 08/09/2019)  . HEMOGLOBIN A1C  04/29/2020  . COLONOSCOPY  12/28/2025  . TETANUS/TDAP  12/08/2026  . Hepatitis C Screening  Completed     ----------------------------------------------------------------------------------------------------------------------------------------------------------------------------------------------------------------- Physical Exam BP 121/64 (BP Location: Left Arm, Patient Position: Sitting, Cuff Size: Normal)   Pulse 61   Wt 199 lb 9.6 oz (90.5 kg)   SpO2 95%   BMI  29.56 kg/m   Physical Exam Constitutional:      Appearance: Normal appearance.  Eyes:     General: No scleral icterus. Musculoskeletal:     Cervical back: Neck supple.  Neurological:     General: No focal deficit present.     Mental Status: He is alert.  Psychiatric:        Attention and Perception: Attention normal.        Mood and Affect: Mood is not depressed.        Behavior: Behavior normal.        Thought Content: Thought content normal. Thought content does not include homicidal or suicidal ideation. Thought content does not include homicidal or suicidal plan.     ------------------------------------------------------------------------------------------------------------------------------------------------------------------------------------------------------------------- Assessment and Plan  Moderate episode of recurrent major depressive disorder ([place]) He continues to have depressive symptoms.  Instructed to return call to psychiatry.  Will start fluoxetine 20mg  daily  Referral placed to therapist as well.  Instructed to contact clinic for worsening symptoms. Contact 911 for SI.   Return in about 6 weeks (around 01/08/2020) for Depression/anxiety.    Meds ordered this encounter  Medications  . FLUoxetine (PROZAC) 20 MG capsule    Sig: Take 1 capsule (20 mg total) by mouth daily.    Dispense:  30 capsule    Refill:  3    Return in about 6 weeks (around 01/08/2020) for Depression/anxiety.    This visit occurred during the SARS-CoV-2 public health emergency.  Safety protocols were in place, including screening questions prior to the visit, additional usage of staff PPE, and extensive cleaning of exam room while observing appropriate contact time as indicated for disinfecting solutions.

## 2019-11-29 NOTE — Assessment & Plan Note (Addendum)
He continues to have depressive symptoms.  Instructed to return call to psychiatry.  Will start fluoxetine 20mg  daily  Referral placed to therapist as well.  Instructed to contact clinic for worsening symptoms. Contact 911 for SI.   Return in about 6 weeks (around 01/08/2020) for Depression/anxiety.

## 2019-12-07 ENCOUNTER — Telehealth (HOSPITAL_COMMUNITY): Payer: Medicare Other | Admitting: Psychiatry

## 2020-01-03 ENCOUNTER — Other Ambulatory Visit: Payer: Self-pay | Admitting: Family Medicine

## 2020-01-07 ENCOUNTER — Ambulatory Visit: Payer: Medicare Other | Admitting: Family Medicine

## 2020-01-15 ENCOUNTER — Other Ambulatory Visit: Payer: Self-pay

## 2020-01-15 ENCOUNTER — Ambulatory Visit (INDEPENDENT_AMBULATORY_CARE_PROVIDER_SITE_OTHER): Payer: Medicare Other | Admitting: Nurse Practitioner

## 2020-01-15 ENCOUNTER — Encounter: Payer: Self-pay | Admitting: Nurse Practitioner

## 2020-01-15 VITALS — BP 122/79 | HR 89 | Temp 97.3°F | Ht 69.0 in | Wt 208.7 lb

## 2020-01-15 DIAGNOSIS — I2510 Atherosclerotic heart disease of native coronary artery without angina pectoris: Secondary | ICD-10-CM

## 2020-01-15 DIAGNOSIS — F331 Major depressive disorder, recurrent, moderate: Secondary | ICD-10-CM | POA: Diagnosis not present

## 2020-01-15 DIAGNOSIS — E119 Type 2 diabetes mellitus without complications: Secondary | ICD-10-CM

## 2020-01-15 DIAGNOSIS — F39 Unspecified mood [affective] disorder: Secondary | ICD-10-CM | POA: Diagnosis not present

## 2020-01-15 DIAGNOSIS — I739 Peripheral vascular disease, unspecified: Secondary | ICD-10-CM | POA: Diagnosis not present

## 2020-01-15 DIAGNOSIS — R5382 Chronic fatigue, unspecified: Secondary | ICD-10-CM

## 2020-01-15 DIAGNOSIS — E1169 Type 2 diabetes mellitus with other specified complication: Secondary | ICD-10-CM

## 2020-01-15 DIAGNOSIS — E785 Hyperlipidemia, unspecified: Secondary | ICD-10-CM

## 2020-01-15 DIAGNOSIS — R631 Polydipsia: Secondary | ICD-10-CM | POA: Diagnosis not present

## 2020-01-15 DIAGNOSIS — R531 Weakness: Secondary | ICD-10-CM | POA: Diagnosis not present

## 2020-01-15 DIAGNOSIS — E1165 Type 2 diabetes mellitus with hyperglycemia: Secondary | ICD-10-CM

## 2020-01-15 NOTE — Progress Notes (Signed)
Acute Office Visit  Subjective:    Patient ID: Anthony [name]., male    DOB: 1950/01/19, 70 y.o.   MRN: [id number]  Chief Complaint  Patient presents with  . Joint Pain    HPI Patient is in today to discuss leg pain, decreased energy, thirst/dry mouth, and mood.   Leg Pain He reports that he is experiencing pain in his lower extremities and unable to walk for more than about 200 yards before he has to stop and take a break due to pain. He tells me that he enjoys walking and he used to walk for miles, but the pain makes this not as enjoyable for him and it is frustrating him. He describes the pain as aching and not well defined whether it is in the joints or the muscles. He reports that the pain is from his ankles all the way to his hips and is in both legs. He tells me "every day is worse than the day before with my pain and energy level.".   Increased Thirst/Dry mouth He reports that he is always thirsty and his mouth is constantly dry. He tells me that he started tapering off of his medications because he believed that his medicine was causing the dry mouth and he has now stopped taking all of his prescription medications, but the dry mouth has worsened. He says that he is not sure what the problem is. He is not interested in restarting his medications right now because he is not sure that he needs them.  He denies weight loss, increased hunger, dizziness, or vision changes.   Mood He tells me he is very depressed and has a hard time enjoying things or seeing the good things in life. He reports that his daughter recently re-married and she "doesn't need me anymore". He also expresses sadness that he doesn't get to spend as much time with his 30 year old grandson. He reports that he has talked to primary care, psychiatrists, counselors, and his family about his depression but he feels that no one really understands and that "it's not worth bringing everyone down".   He reports that he was  recently started on prozac, but admits that he only took one dose of the medication and did not continue. He reports that he has tried this in the past and did not have a good outcome with it and when he started it again he was fearful of the side effects so he decided not to take it.   He endorses sadness, lack of energy, and lack of motivation. He tells me he is lonely. He denies suicidal ideation or a plan, but does tell me that there are times that he feels everyone would be better off if he was not here. He tells me his appetite is decreased and he sometimes doesn't have the energy or motivation to get out of bed.    Past Medical History:  Diagnosis Date  . Anxiety   . Arthritis   . Bipolar disorder ([place])   . CAD (coronary artery disease)    a. INF STEMI 07/04/10:  tx with thrombectomy + [facility] to [facility];  cath 07/04/10: dLM 10-20%, pLAD 40-50%, mLAD 20-30%, pRCA 30%, mRCA occluded and tx with PCI, EF 50% with inf HK. A Multilink  . COPD (chronic obstructive pulmonary disease) ([place])   . Depression   . Glucose intolerance (impaired glucose tolerance)    A1c 6.2 06/2010  . History of DVT (deep vein thrombosis)  traumatic, s/p coumadin tx.  Marland Kitchen HLD (hyperlipidemia)   . Hypertension    pt states is currently on no medications  . Myocardial infarction ([place])    around 2013  . Shortness of breath    walking distance or climbing stairs  . Tobacco abuse   . Urinary frequency   . Vitamin D deficiency 09/05/2018    Past Surgical History:  Procedure Laterality Date  . CARDIAC CATHETERIZATION    . COLONOSCOPY N/A 12/29/2015   Procedure: COLONOSCOPY;  Surgeon: Rogene Houston, MD;  [facility];  Service: Endoscopy;  Laterality: N/A;  12:55  . ELECTROCARDIOGRAM     Showed inferolateral ST-elevation and a code STEMI was activated. In the ER, he was treated with morphine, herarin, and 600 mg of Plavix. He was transferred emergently to [facility].   . INGUINAL HERNIA REPAIR  Left 07/26/2014   Procedure: OPEN REPAIR OF RECURENT LEFT INGUINAL HERNIA REPAIR WITH MESH;  Surgeon: Greer Pickerel, MD;  Location: [facility];  Service: General;  Laterality: Left;  . INSERTION OF MESH Bilateral 10/30/2013   Procedure: INSERTION OF MESH;  Surgeon: Gayland Curry, MD;  Location: [facility];  Service: General;  Laterality: Bilateral;  . LAPAROSCOPIC INGUINAL HERNIA WITH UMBILICAL HERNIA Bilateral 10/30/2013   Procedure: laparoscopic repair left pantaloom hernia with mesh, laparoscopic right inguinal hernia with mesh, OPEN REPAIR OF UMBILICAL HERNIA ;  Surgeon: Gayland Curry, MD;  Location: [facility];  Service: General;  Laterality: Bilateral;  . LAPAROSCOPY N/A 07/26/2014   Procedure: LAPAROSCOPY DIAGNOSTIC;  Surgeon: Greer Pickerel, MD;  Location: [facility];  Service: General;  Laterality: N/A;  . POLYPECTOMY  12/29/2015   Procedure: POLYPECTOMY;  Surgeon: Rogene Houston, MD;  [facility];  Service: Endoscopy;;  ascending colon, descending colon  . stent placement    . TOTAL HIP ARTHROPLASTY Right 05/10/2015   Procedure: RIGHT TOTAL HIP ARTHROPLASTY ANTERIOR APPROACH;  Surgeon: Paralee Cancel, MD;  Location: [facility];  Service: Orthopedics;  Laterality: Right;  Marland Kitchen VASECTOMY      Family History  Problem Relation Age of Onset  . Coronary artery disease Father   . Depression Father   . Heart attack Father   . Depression Mother     Social History   Socioeconomic History  . Marital status: Divorced    Spouse name: Not on file  . Number of children: Not on file  . Years of education: Not on file  . Highest education level: Not on file  Occupational History  . Not on file  Tobacco Use  . Smoking status: Light Tobacco Smoker    Packs/day: 1.50    Years: 45.00    Pack years: 67.50    Types: Cigarettes  . Smokeless tobacco: Never Used  Vaping Use  . Vaping Use: Never used  Substance and Sexual Activity  . Alcohol use: No    Comment: past hx of ETOH use was in inpt facility per  court order  - 20 years, 10-18-2015 per pt not anymore,per pt stopped about 15-20 yrs ago  . Drug use: No    Types: Marijuana, Heroin, Cocaine    Comment: snorted heroin and cocaine, 10-18-2015 per pt in the past he did Marijuana only and nothing else  . Sexual activity: Not Currently    Partners: Female  Other Topics Concern  . Not on file  Social History Narrative   Lives in [place]. He lives alone. He has kids but is not  married.    Social Determinants of Health   Financial Resource Strain: Not on file  Food Insecurity: Not on file  Transportation Needs: Not on file  Physical Activity: Not on file  Stress: Not on file  Social Connections: Not on file  Intimate Partner Violence: Not on file    Outpatient Medications Prior to Visit  Medication Sig Dispense Refill  . aspirin EC 81 MG tablet Take 1 tablet (81 mg total) by mouth daily. 90 tablet 3  . atorvastatin (LIPITOR) 40 MG tablet Take 1 tablet (40 mg total) by mouth daily. 90 tablet 3  . baclofen (LIORESAL) 10 MG tablet TAKE 1 TABLET BY MOUTH  TWICE DAILY 180 tablet 0  . blood glucose meter kit and supplies KIT Dispense based on patient and insurance preference. Use to check BG once daily or every other other.  Dx: E11.9 1 each 0  . FLUoxetine (PROZAC) 20 MG capsule Take 1 capsule (20 mg total) by mouth daily. 30 capsule 3  . gabapentin (NEURONTIN) 800 MG tablet Take 1 tablet (800 mg total) by mouth 3 (three) times daily. 270 tablet 3  . glipiZIDE (GLUCOTROL) 10 MG tablet Take 1 tablet (10 mg total) by mouth 2 (two) times daily before a meal. 180 tablet 3  . lisinopril (ZESTRIL) 5 MG tablet Take 1 tablet (5 mg total) by mouth daily. 90 tablet 3  . metFORMIN (GLUCOPHAGE-XR) 500 MG 24 hr tablet TAKE 2 TABLETS BY MOUTH ONCE DAILY WITH BREAKFAST 60 tablet 0  . metoprolol succinate (TOPROL-XL) 25 MG 24 hr tablet Take 0.5 tablets (12.5 mg total) by mouth daily. 90 tablet 3  . mirtazapine (REMERON) 30 MG tablet Take 30 mg by mouth at  bedtime.    . sennosides-docusate sodium (SENOKOT-S) 8.6-50 MG tablet Take 1 tablet by mouth 3 (three) times daily as needed for constipation.     . temazepam (RESTORIL) 15 MG capsule TAKE 1 CAPSULE BY MOUTH AT BEDTIME AS NEEDED FOR SLEEP 30 capsule 0  . temazepam (RESTORIL) 30 MG capsule Take 1 capsule (30 mg total) by mouth at bedtime as needed for sleep. 30 capsule 2  . traMADol (ULTRAM) 50 MG tablet Take 1 tablet (50 mg total) by mouth every 6 (six) hours as needed for severe pain. 60 tablet 0  . traZODone (DESYREL) 50 MG tablet TAKE 1 TO 2 TABLETS BY MOUTH AT BEDTIME AS NEEDED FOR SLEEP 90 tablet 0   No facility-administered medications prior to visit.    Allergies  Allergen Reactions  . Flomax [Tamsulosin Hcl] Other (See Comments)    This medication makes patient feel sick  . Penicillins Other (See Comments)    Reaction unknown occurred during childhood Has patient had a PCN reaction causing immediate rash, facial/tongue/throat swelling, SOB or lightheadedness with hypotension: unsure - childhood reaction Has patient had a PCN reaction causing severe rash involving mucus membranes or skin necrosis: unsure - childhood reaction Has patient had a PCN reaction that required hospitalization no Has patient had a PCN reaction occurring within the last 10 years: no If all of the above answers are "NO", then may p    Review of Systems All review of systems negative except what is listed in the HPI     Objective:    Physical Exam Vitals and nursing note reviewed.  Constitutional:      General: He is not in acute distress.    Appearance: Normal appearance.  HENT:     Head: Normocephalic.  Eyes:  Extraocular Movements: Extraocular movements intact.     Conjunctiva/sclera: Conjunctivae normal.     Pupils: Pupils are equal, round, and reactive to light.  Cardiovascular:     Rate and Rhythm: Normal rate and regular rhythm.     Pulses: Normal pulses.     Heart sounds: Normal heart  sounds.  Pulmonary:     Effort: Pulmonary effort is normal.     Breath sounds: Normal breath sounds.  Abdominal:     Palpations: Abdomen is soft.  Musculoskeletal:        General: Normal range of motion.     Cervical back: Normal range of motion.     Right lower leg: No edema.     Left lower leg: No edema.  Skin:    General: Skin is warm and dry.     Capillary Refill: Capillary refill takes less than 2 seconds.     Comments: Lower extremities show changes to the skin consistent with peripheral vascular disease including darkening of skin pigmentation, thickening of skin, changes in texture, and the presence of varicose veins.   Neurological:     General: No focal deficit present.     Mental Status: He is alert and oriented to person, place, and time.  Psychiatric:        Attention and Perception: Attention normal.        Mood and Affect: Mood is depressed. Affect is tearful.        Speech: Speech normal.        Behavior: Behavior normal. Behavior is cooperative.        Thought Content: Thought content normal. Thought content does not include suicidal ideation. Thought content does not include suicidal plan.        Cognition and Memory: Cognition normal.     BP 122/79   Pulse 89   Temp (!) 97.3 F (36.3 C)   Ht _0  (1.753 m)   Wt 208 lb 11.2 oz (94.7 kg)   SpO2 94%   BMI 30.82 kg/m  Wt Readings from Last 3 Encounters:  01/15/20 208 lb 11.2 oz (94.7 kg)  11/27/19 199 lb 9.6 oz (90.5 kg)  10/30/19 190 lb 4.8 oz (86.3 kg)    Health Maintenance Due  Topic Date Due  . COVID-19 Vaccine (1) Never done    There are no preventive care reminders to display for this patient.   Lab Results  Component Value Date   TSH 2.41 10/30/2019   Lab Results  Component Value Date   WBC 9.2 10/30/2019   HGB 17.0 10/30/2019   HCT 50.6 (H) 10/30/2019   MCV 92.0 10/30/2019   PLT 203 10/30/2019   Lab Results  Component Value Date   NA 138 10/30/2019   K 4.7 10/30/2019   CO2 30  10/30/2019   GLUCOSE 228 (H) 10/30/2019   BUN 14 10/30/2019   CREATININE 1.03 10/30/2019   BILITOT 0.4 10/30/2019   ALKPHOS 74 07/02/2018   AST 12 10/30/2019   ALT 13 10/30/2019   PROT 7.1 10/30/2019   ALBUMIN 4.7 07/02/2018   CALCIUM 9.7 10/30/2019   ANIONGAP 8 05/11/2015   GFR 94.57 07/28/2010   Lab Results  Component Value Date   CHOL 147 09/23/2018   Lab Results  Component Value Date   HDL 35 (L) 09/23/2018   Lab Results  Component Value Date   LDLCALC 80 09/23/2018   Lab Results  Component Value Date   TRIG 219 (H) 09/23/2018   Lab Results  Component Value Date   CHOLHDL 4.2 09/23/2018   Lab Results  Component Value Date   HGBA1C 7.2 (H) 10/30/2019       Assessment & Plan:  1. Moderate episode of recurrent major depressive disorder ([place]) 2. Mood disorder ([place]) 7. Chronic fatigue Documented mood disorder with concerns for increasing depression. At this time he does not appear to have SI or a plan, however, he is significantly depressed. He has stopped all of his medications, which is concerning from many levels, but I do believe that this behavior stems from the depression and feelings of hopelessness expressed.  Discussed with patient the importance of mental health medications to balance the chemical alterations within the brain. He is not interested in restarting Prozac at this time. He is unable to recall the mood medications he has been on in the past. Will perform a chart review and go over medications that the patient has tried and come up with a plan to start new medications to help with his mood.  Discussed positive thinking exercises that may be helpful increase his mood and allow him to retrain his brain to focus on positive aspects of life.  Recommend continued walking with frequent breaks for pain to help improve mood.  I do feel that once his mood is stabilized he will have a better outlook on his overall health and improved compliance with other  medications.   3. Type 2 diabetes mellitus with hyperglycemia, without long-term current use of insulin ([place]) 4. Diabetes mellitus without complication ([place]) 6. Increased thirst Long standing history of type 2 diabetes. At last check hemoglobin A1c was 7.2%, which was improving from his previous readings. He has stopped taking all of his medications at this time, which includes his diabetes medications and he reports that he is not checking his blood sugar levels.  Spent a significant amount of time discussing that increased thirst is likely related to elevated blood sugar levels as a direct result of stopping these medications.  Discussed with patient the dangers associated with uncontrolled blood sugars and encouraged him to restart these medications to avoid potential long term harm.  Also discussed that improved glycemic levels could result in decreased thirst.  He will consider this.   5. Intermittent claudication ([place]) 8. Hyperlipidemia associated with type 2 diabetes mellitus ([place]) 9. Generalized weakness 10. Coronary artery disease involving native coronary artery of native heart without angina pectoris Long standing history of coronary artery disease with hyperlipidemia in the setting of type 2 diabetes.  Discussed with patient the leg pain is most likely related to peripheral vascular disease and will continue to worsen without medication to manage his underlying conditions.  A chart review reveals that he was in therapy for his lower extremity pain and weakness up to august of last year. We discussed the option to restart therapy, but he is not interested in that at the moment.  Discussed referral to vascular specialist for further evaluation and he is willing to have this done.  Stressed the importance of restarting medications for his underlying conditions to prevent worsening of his chronic conditions and leg pain. He will consider this.   Extensive amount of time spent with  counseling patient on his chronic conditions and the need for the medications that he has been prescribed to help improve his overall health. I strongly feel the underlying factor for his lack of motivation to control his chronic diseases is a direct result of uncontrolled mood disorder and I am hopeful  that once we have better control of that he will consider restarting his medications. This is a very difficult and complex case due to the vast array of significant chronic illnesses that require ongoing management to prevent further damage. Unfortunately, mental health medications will not likely restore his mood quickly and I do have concern for the damage that will progress during this process. I will continue to work with patient to encourage him to restart his medications and follow-up on his mental health.   55 minutes spent on this visit with >50% of the time spent with patient education.    Orma Render, NP

## 2020-01-15 NOTE — Patient Instructions (Signed)
I want you to do three good things every single day.   I will be in touch with you on Monday and we can talk about medications.  I will send a referral for the vein specialist.    Intermittent Claudication Intermittent claudication is pain in one or both legs that occurs when walking or exercising and goes away when resting. Intermittent claudication is a symptom of peripheral arterial disease (PAD). This condition is commonly treated with rest, medicine, and healthy lifestyle changes. If medical management does not improve symptoms, surgery can be done to restore blood flow (revascularization) to the affected leg. What are the causes?  This condition is caused by buildup of fatty material (plaque) within the major arteries in the body (atherosclerosis). Plaque makes arteries stiff and narrow, which prevents enough blood from reaching the leg muscles. Pain occurs when you walk or exercise because your muscles need (but cannot get) more blood when you are moving and exercising. What increases the risk? The following factors may make you more likely to develop this condition:  A family history of atherosclerosis.  A personal history of stroke or heart disease.  Older age.  Being inactive (sedentary lifestyle).  Being overweight.  Smoking cigarettes.  Having another health condition such as: ? Diabetes. ? High blood pressure. ? High cholesterol. What are the signs or symptoms? Symptoms of this condition may first develop in the lower leg, and then they may spread to the thigh, hip, buttock, or the back of the lower leg (calf) over time. Symptoms may include:  Aches or pains.  Cramps.  A feeling of tightness, weakness, or heaviness.  A wound on the lower leg or foot that heals poorly or does not heal. How is this diagnosed? This condition may be diagnosed based on:  Your symptoms.  Your medical history.  Tests, such as: ? Blood tests. ? Arterial duplex ultrasound. This  test uses images of blood vessels and surrounding organs to evaluate blood flow within arteries. ? Angiogram. In this procedure, dye is injected into arteries and then X-rays are taken. ? Magnetic resonance angiogram (MRA). In this procedure, strong magnets and radio waves are used instead of X-rays to create images of blood vessels and blood flow. ? CT angiogram (CTA). In this procedure, a large X-ray machine called a CT scanner takes detailed pictures of blood vessels that have been injected with dye. ? Ankle-brachial index (ABI) test. This procedure measures blood pressure in the leg during exercise and at rest. ? Exercise test. For this test, you will walk on a treadmill while tests are done (such as the ABI test) to evaluate how this condition affects your ability to walk or exercise. How is this treated? Treatment for this condition may involve treatment for the underlying cause, such as treatment for high blood pressure, high cholesterol, or diabetes. Treatment may include:  Lifestyle changes such as: ? Starting a supervised or home-based exercise program. ? Losing weight. ? Quitting smoking.  Medicines to help restore blood flow through your legs.  Blood vessel surgery (angioplasty) to restore blood flow around the blocked vessel. This is also known as endovascular therapy (EVT). This is only done if your intermittent claudication is caused by severe peripheral artery disease, a condition in which blood flow is severely or totally restricted by the narrowing of the arteries. Follow these instructions at home: Lifestyle   Maintain a healthy weight.  Eat a diet that is low in saturated fats and calories. Consider working with  a diet and nutrition specialist (dietitian) to help you make healthy food choices.  Do not use any products that contain nicotine or tobacco, such as cigarettes and e-cigarettes. If you need help quitting, ask your health care provider.  If your health care  provider recommended an exercise program for you, follow it as directed. Your exercise program may involve: ? Walking 3 or more times a week. ? Walking until you have certain symptoms of intermittent claudication. ? Resting until symptoms go away. ? Gradually increasing your walking time to about 50 minutes a day. General instructions  Work with your health care provider to manage any other health conditions you may have, including diabetes, high blood pressure, or high cholesterol.  Take over-the-counter and prescription medicines only as told by your health care provider.  Keep all follow-up visits as told by your health care provider. This is important. Contact a health care provider if:  Your pain does not go away with rest.  You have sores on your legs that do not heal or have a bad smell or pus coming from them.  Your condition gets worse or does not get better with treatment. Get help right away if:  You have chest pain.  You have difficulty breathing.  You develop arm weakness.  You have trouble speaking.  Your face begins to droop.  Your foot or leg is cold or it changes color.  Your foot or leg becomes numb. These symptoms may represent a serious problem that is an emergency. Do not wait to see if the symptoms will go away. Get medical help right away. Call your local emergency services (911 in the [place] Do not drive yourself to the hospital.  Summary  Intermittent claudication is pain in one or both legs that occurs when walking or exercising and goes away when resting.  This condition is caused by buildup of fatty material (plaque) within the major arteries in the body (atherosclerosis). Plaque makes arteries stiff and narrow, which prevents enough blood from reaching the leg muscles.  Intermittent claudication can be treated with medicine and lifestyle changes. If medical treatment fails, surgery can be done to help return blood flow to the affected  area.  Make sure you work with your health care provider to manage any other health conditions you may have, including diabetes, high blood pressure, or high cholesterol. This information is not intended to replace advice given to you by your health care provider. Make sure you discuss any questions you have with your health care provider. Document Revised: 12/07/2016 Document Reviewed: 01/26/2016 Elsevier Patient Education  2020 [employer].

## 2020-01-17 ENCOUNTER — Other Ambulatory Visit: Payer: Self-pay | Admitting: Osteopathic Medicine

## 2020-01-17 ENCOUNTER — Other Ambulatory Visit: Payer: Self-pay | Admitting: Family Medicine

## 2020-01-17 DIAGNOSIS — E1165 Type 2 diabetes mellitus with hyperglycemia: Secondary | ICD-10-CM

## 2020-01-21 ENCOUNTER — Telehealth: Payer: Self-pay

## 2020-01-21 NOTE — Telephone Encounter (Signed)
Thank you for the message. I called the patient on Monday, but the call did not go through and have not had time to get back to him. I have reviewed his chart and it looks like he has tried Wellbutrin, Duloxetine, and Prozac in the past. I would like to try him on sertraline at bedtime to see if this is helpful for his symptoms if he would like. I will try him again in the morning.

## 2020-01-21 NOTE — Telephone Encounter (Signed)
Pt had an OV on 01/15/2020 and was told that a chart review was going to be done to see what med he could take instead of the Prozac. He said the thought he was going to get a call from Emeterio Reeve on Saturday, but he has not heard anything yet. He is wanting to know if anything has been figured out yet.

## 2020-01-22 ENCOUNTER — Telehealth: Payer: Self-pay | Admitting: Nurse Practitioner

## 2020-01-22 ENCOUNTER — Other Ambulatory Visit: Payer: Self-pay | Admitting: Nurse Practitioner

## 2020-01-22 DIAGNOSIS — F332 Major depressive disorder, recurrent severe without psychotic features: Secondary | ICD-10-CM

## 2020-01-22 MED ORDER — SERTRALINE HCL 50 MG PO TABS: ORAL_TABLET | ORAL | 3 refills | Status: DC

## 2020-01-22 NOTE — Telephone Encounter (Signed)
Spoke with patient by phone. He reports that he has not restarted any of his medications at this time, but states that he is feeling "ok". He has picked his grandson up from daycare every day this week and he reports that has lifted his mood.   Discussed starting sertraline for depression and he is willing to try that. He tells me he will get started on that this weekend.   Recommend follow-up with Dr. Zigmund Daniel to let him know how the medication is working. Encouraged him to restart his medication for diabetes and cholesterol given his history of cardiovascular disease. He will consider this. He says that his blood pressure has been "good".  Prescription for sertraline sent with taper from 25mg  to 50mg .

## 2020-01-22 NOTE — Telephone Encounter (Signed)
Spoke with pt and he is willing to try the sertraline.

## 2020-02-02 ENCOUNTER — Other Ambulatory Visit: Payer: Self-pay | Admitting: Family Medicine

## 2020-02-03 ENCOUNTER — Other Ambulatory Visit: Payer: Self-pay

## 2020-02-03 DIAGNOSIS — F5101 Primary insomnia: Secondary | ICD-10-CM

## 2020-02-03 MED ORDER — TRAZODONE HCL 50 MG PO TABS: 50.0000 mg | ORAL_TABLET | Freq: Every evening | ORAL | 0 refills | Status: DC | PRN

## 2020-02-03 MED ORDER — TEMAZEPAM 15 MG PO CAPS: ORAL_CAPSULE | ORAL | 2 refills | Status: DC

## 2020-02-05 ENCOUNTER — Other Ambulatory Visit: Payer: Self-pay | Admitting: Family Medicine

## 2020-02-05 DIAGNOSIS — F5101 Primary insomnia: Secondary | ICD-10-CM

## 2020-02-05 MED ORDER — TEMAZEPAM 15 MG PO CAPS: ORAL_CAPSULE | ORAL | 1 refills | Status: DC

## 2020-02-05 MED ORDER — TEMAZEPAM 15 MG PO CAPS: ORAL_CAPSULE | ORAL | 2 refills | Status: DC

## 2020-02-08 ENCOUNTER — Other Ambulatory Visit: Payer: Self-pay | Admitting: Osteopathic Medicine

## 2020-02-09 ENCOUNTER — Other Ambulatory Visit: Payer: Self-pay

## 2020-02-09 NOTE — Telephone Encounter (Signed)
Needs follow up to review chronic opioid contract and obtain UDS if needing tramadol long term.

## 2020-02-24 ENCOUNTER — Other Ambulatory Visit: Payer: Self-pay | Admitting: *Deleted

## 2020-02-24 MED ORDER — TRAMADOL HCL 50 MG PO TABS: ORAL_TABLET | ORAL | 1 refills | Status: DC

## 2020-02-24 NOTE — Telephone Encounter (Signed)
Pt called this morning asking for a refill of Tramadol.  I see where you ordered #60 on 2/1 but the pharmacy only gave him #28.  I verified this with the pharmacy as well.  They stated that it was an initial fill (even thought it's clearly not) so they only provided him with 7 days worth.

## 2020-03-11 NOTE — Progress Notes (Deleted)
Referring-Anthony Zigmund Daniel, DO Reason for referral-coronary artery disease  HPI: 70 year old male for evaluation of coronary artery disease at request of [name], DO.  Patient has a history of coronary disease.  He had PCI of his right coronary artery in the setting of acute inferior infarct in June 2012.  He was otherwise noted to have 10 to 20% distal left main, 40 to 50% proximal LAD, 20 to 30% mid LAD and 30% proximal right coronary artery.  Ejection fraction 50%.  CTA March 2020 showed 3.2 cm abdominal aortic aneurysm, right common iliac artery aneurysm measuring 2.3 cm, left common iliac artery aneurysm measuring 2.1 cm, right common femoral artery aneurysm measuring 1.5 cm, distal SFA aneurysm measuring 1.7 cm, left common femoral artery aneurysm measuring 1.6 cm, distal left SFA measuring 1.8 cm and left popliteal artery aneurysm measuring 2.2 cm.  Current Outpatient Medications  Medication Sig Dispense Refill  . aspirin EC 81 MG tablet Take 1 tablet (81 mg total) by mouth daily. 90 tablet 3  . atorvastatin (LIPITOR) 40 MG tablet Take 1 tablet (40 mg total) by mouth daily. 90 tablet 3  . baclofen (LIORESAL) 10 MG tablet TAKE 1 TABLET BY MOUTH  TWICE DAILY 180 tablet 0  . blood glucose meter kit and supplies KIT Dispense based on patient and insurance preference. Use to check BG once daily or every other other.  Dx: E11.9 1 each 0  . gabapentin (NEURONTIN) 800 MG tablet Take 1 tablet (800 mg total) by mouth 3 (three) times daily. 270 tablet 3  . glipiZIDE (GLUCOTROL) 10 MG tablet Take 1 tablet (10 mg total) by mouth 2 (two) times daily before a meal. 180 tablet 3  . lisinopril (ZESTRIL) 5 MG tablet Take 1 tablet (5 mg total) by mouth daily. 90 tablet 3  . metFORMIN (GLUCOPHAGE-XR) 500 MG 24 hr tablet TAKE 2 TABLETS BY MOUTH  DAILY WITH BREAKFAST 180 tablet 3  . metoprolol succinate (TOPROL-XL) 25 MG 24 hr tablet Take 0.5 tablets (12.5 mg total) by mouth daily. 90 tablet 3  .  mirtazapine (REMERON) 30 MG tablet TAKE 1 TABLET BY MOUTH AT  BEDTIME 90 tablet 0  . sennosides-docusate sodium (SENOKOT-S) 8.6-50 MG tablet Take 1 tablet by mouth 3 (three) times daily as needed for constipation.     . sertraline (ZOLOFT) 50 MG tablet Take 1/2 tab (55m) by mouth at bedtime for 6 days then increase to 1 tab (561m by mouth every night at bedtime. 30 tablet 3  . temazepam (RESTORIL) 15 MG capsule TAKE 1 CAPSULE BY MOUTH AT BEDTIME AS NEEDED FOR SLEEP 90 capsule 1  . traMADol (ULTRAM) 50 MG tablet TAKE 1 TABLET BY MOUTH EVERY 6 HOURS AS NEEDED FOR SEVERE PAIN 60 tablet 1  . [START ON 04/04/2020] traZODone (DESYREL) 50 MG tablet Take 1-2 tablets (50-100 mg total) by mouth at bedtime as needed. for sleep 90 tablet 0   No current facility-administered medications for this visit.    Allergies  Allergen Reactions  . Flomax [Tamsulosin Hcl] Other (See Comments)    This medication makes patient feel sick  . Penicillins Other (See Comments)    Reaction unknown occurred during childhood Has patient had a PCN reaction causing immediate rash, facial/tongue/throat swelling, SOB or lightheadedness with hypotension: unsure - childhood reaction Has patient had a PCN reaction causing severe rash involving mucus membranes or skin necrosis: unsure - childhood reaction Has patient had a PCN reaction that required hospitalization no Has patient had  a PCN reaction occurring within the last 10 years: no If all of the above answers are "NO", then may p    Past Medical History:  Diagnosis Date  . Anxiety   . Arthritis   . Bipolar disorder ([place])   . CAD (coronary artery disease)    a. INF STEMI 07/04/10:  tx with thrombectomy + [facility] to [facility];  cath 07/04/10: dLM 10-20%, pLAD 40-50%, mLAD 20-30%, pRCA 30%, mRCA occluded and tx with PCI, EF 50% with inf HK. A Multilink  . COPD (chronic obstructive pulmonary disease) ([place])   . Depression   . Glucose intolerance (impaired glucose tolerance)     A1c 6.2 06/2010  . History of DVT (deep vein thrombosis)    traumatic, s/p coumadin tx.  Marland Kitchen HLD (hyperlipidemia)   . Hypertension    pt states is currently on no medications  . Myocardial infarction ([place])    around 2013  . Shortness of breath    walking distance or climbing stairs  . Tobacco abuse   . Urinary frequency   . Vitamin D deficiency 09/05/2018    Past Surgical History:  Procedure Laterality Date  . CARDIAC CATHETERIZATION    . COLONOSCOPY N/A 12/29/2015   Procedure: COLONOSCOPY;  Surgeon: Rogene Houston, MD;  [facility];  Service: Endoscopy;  Laterality: N/A;  12:55  . ELECTROCARDIOGRAM     Showed inferolateral ST-elevation and a code STEMI was activated. In the ER, he was treated with morphine, herarin, and 600 mg of Plavix. He was transferred emergently to [facility].   . INGUINAL HERNIA REPAIR Left 07/26/2014   Procedure: OPEN REPAIR OF RECURENT LEFT INGUINAL HERNIA REPAIR WITH MESH;  Surgeon: Greer Pickerel, MD;  [facility]: [facility];  Service: General;  Laterality: Left;  . INSERTION OF MESH Bilateral 10/30/2013   Procedure: INSERTION OF MESH;  Surgeon: Gayland Curry, MD;  [facility];  Service: General;  Laterality: Bilateral;  . LAPAROSCOPIC INGUINAL HERNIA WITH UMBILICAL HERNIA Bilateral 10/30/2013   Procedure: laparoscopic repair left pantaloom hernia with mesh, laparoscopic right inguinal hernia with mesh, OPEN REPAIR OF UMBILICAL HERNIA ;  Surgeon: Gayland Curry, MD;  [facility];  Service: General;  Laterality: Bilateral;  . LAPAROSCOPY N/A 07/26/2014   Procedure: LAPAROSCOPY DIAGNOSTIC;  Surgeon: Greer Pickerel, MD;  [facility];  Service: General;  Laterality: N/A;  . POLYPECTOMY  12/29/2015   Procedure: POLYPECTOMY;  Surgeon: Rogene Houston, MD;  [facility];  Service: Endoscopy;;  ascending colon, descending colon  . stent placement    . TOTAL HIP ARTHROPLASTY Right 05/10/2015   Procedure: RIGHT TOTAL HIP ARTHROPLASTY  ANTERIOR APPROACH;  Surgeon: Paralee Cancel, MD;  [facility];  Service: Orthopedics;  Laterality: Right;  Marland Kitchen VASECTOMY      Social History   Socioeconomic History  . Marital status: Divorced    Spouse name: Not on file  . Number of children: Not on file  . Years of education: Not on file  . Highest education level: Not on file  Occupational History  . Not on file  Tobacco Use  . Smoking status: Light Tobacco Smoker    Packs/day: 1.50    Years: 45.00    Pack years: 67.50    Types: Cigarettes  . Smokeless tobacco: Never Used  Vaping Use  . Vaping Use: Never used  Substance and Sexual Activity  . Alcohol use: No    Comment: past hx of ETOH use  was in inpt facility per court order  - 20 years, 10-18-2015 per pt not anymore,per pt stopped about 15-20 yrs ago  . Drug use: No    Types: Marijuana, Heroin, Cocaine    Comment: snorted heroin and cocaine, 10-18-2015 per pt in the past he did Marijuana only and nothing else  . Sexual activity: Not Currently    Partners: Female  Other Topics Concern  . Not on file  Social History Narrative   Lives in [place]. He lives alone. He has kids but is not married.    Social Determinants of Health   Financial Resource Strain: Not on file  Food Insecurity: Not on file  Transportation Needs: Not on file  Physical Activity: Not on file  Stress: Not on file  Social Connections: Not on file  Intimate Partner Violence: Not on file    Family History  Problem Relation Age of Onset  . Coronary artery disease Father   . Depression Father   . Heart attack Father   . Depression Mother     ROS: no fevers or chills, productive cough, hemoptysis, dysphasia, odynophagia, melena, hematochezia, dysuria, hematuria, rash, seizure activity, orthopnea, PND, pedal edema, claudication. Remaining systems are negative.  Physical Exam:   There were no vitals taken for this visit.  General:  Well developed/well nourished in NAD Skin warm/dry Patient  not depressed No peripheral clubbing Back-normal HEENT-normal/normal eyelids Neck supple/normal carotid upstroke bilaterally; no bruits; no JVD; no thyromegaly chest - CTA/ normal expansion CV - RRR/normal S1 and S2; no murmurs, rubs or gallops;  PMI nondisplaced Abdomen -NT/ND, no HSM, no mass, + bowel sounds, no bruit 2+ femoral pulses, no bruits Ext-no edema, chords, 2+ DP Neuro-grossly nonfocal  ECG - personally reviewed  A/P  1 coronary artery disease-continue medical therapy with aspirin and statin.  2 hypertension-patient's blood pressure is controlled.  Continue present medications.  3 hyperlipidemia-continue statin.  4 peripheral vascular disease-patient with abdominal aortic aneurysm and iliac aneurysms as outlined above.  Needs follow-up CTA.  Kirk Ruths, MD

## 2020-03-23 ENCOUNTER — Ambulatory Visit: Payer: Medicare Other | Admitting: Cardiology

## 2020-03-24 ENCOUNTER — Other Ambulatory Visit: Payer: Self-pay | Admitting: Family Medicine

## 2020-03-25 ENCOUNTER — Other Ambulatory Visit: Payer: Self-pay | Admitting: Family Medicine

## 2020-03-25 DIAGNOSIS — M791 Myalgia, unspecified site: Secondary | ICD-10-CM

## 2020-04-14 ENCOUNTER — Ambulatory Visit: Payer: Medicare Other | Admitting: Family Medicine

## 2020-05-26 ENCOUNTER — Other Ambulatory Visit: Payer: Self-pay | Admitting: Family Medicine

## 2020-06-03 ENCOUNTER — Telehealth: Payer: Self-pay | Admitting: Family Medicine

## 2020-06-03 NOTE — Chronic Care Management (AMB) (Signed)
  Chronic Care Management   Outreach Note  06/03/2020 Name: Anthony [name]. MRN: [id number] DOB: [birth year]  Referred by: [name], DO Reason for referral : No chief complaint on file.   An unsuccessful telephone outreach was attempted today. The patient was referred to the pharmacist for assistance with care management and care coordination.   Follow Up Plan:   Lauretta Grill Upstream Scheduler

## 2020-06-09 ENCOUNTER — Other Ambulatory Visit: Payer: Self-pay | Admitting: Family Medicine

## 2020-06-22 ENCOUNTER — Telehealth: Payer: Self-pay | Admitting: Family Medicine

## 2020-06-22 NOTE — Progress Notes (Signed)
  Chronic Care Management   Outreach Note  06/22/2020 Name: Loni Abdon. MRN: [id number] DOB: [birth year]  Referred by: [name], DO Reason for referral : No chief complaint on file.   A second unsuccessful telephone outreach was attempted today. The patient was referred to pharmacist for assistance with care management and care coordination.  Follow Up Plan:   Lauretta Grill Upstream Scheduler

## 2020-06-23 ENCOUNTER — Telehealth: Payer: Self-pay | Admitting: Family Medicine

## 2020-06-23 NOTE — Progress Notes (Signed)
  Chronic Care Management   Outreach Note  06/23/2020 Name: Anthony [name]. MRN: [id number] DOB: 09-Oct-1950  Referred by: [name], DO Reason for referral : No chief complaint on file.   A second unsuccessful telephone outreach was attempted today. The patient was referred to pharmacist for assistance with care management and care coordination.  Follow Up Plan:   Lauretta Grill Upstream Scheduler

## 2020-07-05 ENCOUNTER — Telehealth: Payer: Self-pay | Admitting: Family Medicine

## 2020-07-05 NOTE — Progress Notes (Signed)
  Chronic Care Management   Outreach Note  07/05/2020 Name: Anthony [name]. MRN: [id number] DOB: [birth year]  Referred by: [name], DO Reason for referral : No chief complaint on file.   Third unsuccessful telephone outreach was attempted today. The patient was referred to the pharmacist for assistance with care management and care coordination.   Follow Up Plan:   Lauretta Grill Upstream Scheduler

## 2020-07-06 ENCOUNTER — Other Ambulatory Visit: Payer: Self-pay | Admitting: Family Medicine

## 2020-07-06 DIAGNOSIS — F5101 Primary insomnia: Secondary | ICD-10-CM

## 2020-07-07 ENCOUNTER — Other Ambulatory Visit: Payer: Self-pay | Admitting: *Deleted

## 2020-07-15 ENCOUNTER — Other Ambulatory Visit: Payer: Self-pay

## 2020-07-15 DIAGNOSIS — F5101 Primary insomnia: Secondary | ICD-10-CM

## 2020-07-22 ENCOUNTER — Telehealth: Payer: Self-pay

## 2020-07-22 NOTE — Telephone Encounter (Signed)
Please call and schedule patient an appointment for follow-up and refills.   Canceled last appt. Thanks

## 2020-07-22 NOTE — Telephone Encounter (Signed)
Lvm  for patient to call us back. 

## 2020-07-25 DIAGNOSIS — E7800 Pure hypercholesterolemia, unspecified: Secondary | ICD-10-CM | POA: Diagnosis not present

## 2020-07-25 DIAGNOSIS — E559 Vitamin D deficiency, unspecified: Secondary | ICD-10-CM | POA: Diagnosis not present

## 2020-07-25 DIAGNOSIS — E039 Hypothyroidism, unspecified: Secondary | ICD-10-CM | POA: Diagnosis not present

## 2020-07-25 DIAGNOSIS — Z1159 Encounter for screening for other viral diseases: Secondary | ICD-10-CM | POA: Diagnosis not present

## 2020-07-25 DIAGNOSIS — Z0000 Encounter for general adult medical examination without abnormal findings: Secondary | ICD-10-CM | POA: Diagnosis not present

## 2020-07-25 DIAGNOSIS — F5101 Primary insomnia: Secondary | ICD-10-CM | POA: Diagnosis not present

## 2020-07-25 DIAGNOSIS — Z79899 Other long term (current) drug therapy: Secondary | ICD-10-CM | POA: Diagnosis not present

## 2020-07-25 DIAGNOSIS — D539 Nutritional anemia, unspecified: Secondary | ICD-10-CM | POA: Diagnosis not present

## 2020-07-25 DIAGNOSIS — Z131 Encounter for screening for diabetes mellitus: Secondary | ICD-10-CM | POA: Diagnosis not present

## 2020-07-29 DIAGNOSIS — G4700 Insomnia, unspecified: Secondary | ICD-10-CM | POA: Diagnosis not present

## 2020-07-29 DIAGNOSIS — Z7185 Encounter for immunization safety counseling: Secondary | ICD-10-CM | POA: Diagnosis not present

## 2020-07-29 DIAGNOSIS — E119 Type 2 diabetes mellitus without complications: Secondary | ICD-10-CM | POA: Diagnosis not present

## 2020-07-29 DIAGNOSIS — N182 Chronic kidney disease, stage 2 (mild): Secondary | ICD-10-CM | POA: Diagnosis not present

## 2020-08-11 NOTE — Progress Notes (Deleted)
HPI: Follow-up coronary artery disease and claudication.  Previously followed by Dr. [name]  Patient had PCI of his mid right coronary artery in 2012 in the setting of an inferior myocardial infarction.  Also with history of recurrent DVT.  CTA March 2020 showed 3.2 cm abdominal aortic aneurysm, no significant aortoiliac occlusive disease, right common iliac artery aneurysm measuring 2.3 cm, left common iliac artery aneurysm measuring 2.1 cm, right common femoral artery aneurysm measuring 1.5 cm, distal right SFA aneurysm at 1.7 cm, left common femoral artery aneurysm at 1.6 cm, distal left superficial femoral artery aneurysm at 1.8 cm and left popliteal artery aneurysm at 2.2 cm.  This is my first time seeing the patient.  Current Outpatient Medications  Medication Sig Dispense Refill   traZODone (DESYREL) 50 MG tablet TAKE 1 TO 2 TABLETS BY MOUTH AT BEDTIME AS NEEDED FOR SLEEP 90 tablet 0   aspirin EC 81 MG tablet Take 1 tablet (81 mg total) by mouth daily. 90 tablet 3   atorvastatin (LIPITOR) 40 MG tablet Take 1 tablet (40 mg total) by mouth daily. 90 tablet 3   baclofen (LIORESAL) 10 MG tablet TAKE 1 TABLET BY MOUTH  TWICE DAILY 180 tablet 0   blood glucose meter kit and supplies KIT Dispense based on patient and insurance preference. Use to check BG once daily or every other other.  Dx: E11.9 1 each 0   gabapentin (NEURONTIN) 800 MG tablet Take 1 tablet (800 mg total) by mouth 3 (three) times daily. 270 tablet 3   glipiZIDE (GLUCOTROL) 10 MG tablet Take 1 tablet (10 mg total) by mouth 2 (two) times daily before a meal. 180 tablet 3   lisinopril (ZESTRIL) 5 MG tablet Take 1 tablet (5 mg total) by mouth daily. 90 tablet 3   metFORMIN (GLUCOPHAGE-XR) 500 MG 24 hr tablet TAKE 2 TABLETS BY MOUTH  DAILY WITH BREAKFAST 180 tablet 3   metoprolol succinate (TOPROL-XL) 25 MG 24 hr tablet Take 0.5 tablets (12.5 mg total) by mouth daily. 90 tablet 3   mirtazapine (REMERON) 30 MG tablet TAKE 1  TABLET BY MOUTH AT  BEDTIME 90 tablet 1   sennosides-docusate sodium (SENOKOT-S) 8.6-50 MG tablet Take 1 tablet by mouth 3 (three) times daily as needed for constipation.      sertraline (ZOLOFT) 50 MG tablet Take 1/2 tab (28m) by mouth at bedtime for 6 days then increase to 1 tab (550m by mouth every night at bedtime. 30 tablet 3   temazepam (RESTORIL) 15 MG capsule TAKE 1 CAPSULE BY MOUTH AT BEDTIME AS NEEDED FOR SLEEP 90 capsule 1   traMADol (ULTRAM) 50 MG tablet TAKE 1 TABLET BY MOUTH EVERY 6 HOURS AS NEEDED FOR SEVERE PAIN 60 tablet 1   No current facility-administered medications for this visit.     Past Medical History:  Diagnosis Date   Anxiety    Arthritis    Bipolar disorder ([place]   CAD (coronary artery disease)    a. INF STEMI 07/04/10:  [facility]: dLM 10-20%, pLAD 40-50%, mLAD 20-30%, pRCA 30%, mRCA occluded and tx with PCI, EF 50% with inf HK. A Multilink   COPD (chronic obstructive pulmonary disease) ([facility])    Depression    Glucose intolerance (impaired glucose tolerance)    A1c 6.2 06/2010   History of DVT (deep vein thrombosis)    traumatic, s/p coumadin tx.   HLD (hyperlipidemia)  Hypertension    pt states is currently on no medications   Myocardial infarction [facility])    around 2013   Shortness of breath    walking distance or climbing stairs   Tobacco abuse    Urinary frequency    Vitamin D deficiency 09/05/2018    Past Surgical History:  Procedure Laterality Date   CARDIAC CATHETERIZATION     COLONOSCOPY N/A 12/29/2015   Procedure: COLONOSCOPY;  Surgeon: Rogene Houston, MD;  [facility];  Service: Endoscopy;  Laterality: N/A;  12:55   ELECTROCARDIOGRAM     Showed inferolateral ST-elevation and a code STEMI was activated. In the ER, he was treated with morphine, herarin, and 600 mg of Plavix. He was transferred emergently to [facility].    INGUINAL HERNIA REPAIR Left 07/26/2014   Procedure:  OPEN REPAIR OF RECURENT LEFT INGUINAL HERNIA REPAIR WITH MESH;  Surgeon: Greer Pickerel, MD;  [facility];  Service: General;  Laterality: Left;   INSERTION OF MESH Bilateral 10/30/2013   Procedure: INSERTION OF MESH;  Surgeon: Gayland Curry, MD;  [facility];  Service: General;  Laterality: Bilateral;   LAPAROSCOPIC INGUINAL HERNIA WITH UMBILICAL HERNIA Bilateral 10/30/2013   Procedure: laparoscopic repair left pantaloom hernia with mesh, laparoscopic right inguinal hernia with mesh, OPEN REPAIR OF UMBILICAL HERNIA ;  Surgeon: Gayland Curry, MD;  [facility];  Service: General;  Laterality: Bilateral;   LAPAROSCOPY N/A 07/26/2014   Procedure: LAPAROSCOPY DIAGNOSTIC;  Surgeon: Greer Pickerel, MD;  [facility];  Service: General;  Laterality: N/A;   POLYPECTOMY  12/29/2015   Procedure: POLYPECTOMY;  Surgeon: Rogene Houston, MD;  [facility];  Service: Endoscopy;;  ascending colon, descending colon   stent placement     TOTAL HIP ARTHROPLASTY Right 05/10/2015   Procedure: RIGHT TOTAL HIP ARTHROPLASTY ANTERIOR APPROACH;  Surgeon: Paralee Cancel, MD;  [facility];  Service: Orthopedics;  Laterality: Right;   VASECTOMY      Social History   Socioeconomic History   Marital status: Divorced    Spouse name: Not on file   Number of children: Not on file   Years of education: Not on file   Highest education level: Not on file  Occupational History   Not on file  Tobacco Use   Smoking status: Light Smoker    Packs/day: 1.50    Years: 45.00    Pack years: 67.50    Types: Cigarettes   Smokeless tobacco: Never  Vaping Use   Vaping Use: Never used  Substance and Sexual Activity   Alcohol use: No    Comment: past hx of ETOH use was in inpt facility per court order  - 20 years, 10-18-2015 per pt not anymore,per pt stopped about 15-20 yrs ago   Drug use: No    Types: Marijuana, Heroin, Cocaine    Comment: snorted heroin and cocaine, 10-18-2015 per pt in the past he did  Marijuana only and nothing else   Sexual activity: Not Currently    Partners: Female  Other Topics Concern   Not on file  Social History Narrative   Lives in [place]. He lives alone. He has kids but is not married.    Social Determinants of Health   Financial Resource Strain: Not on file  Food Insecurity: Not on file  Transportation Needs: Not on file  Physical Activity: Not on file  Stress: Not on file  Social Connections: Not on file  Intimate  Partner Violence: Not on file    Family History  Problem Relation Age of Onset   Coronary artery disease Father    Depression Father    Heart attack Father    Depression Mother     ROS: no fevers or chills, productive cough, hemoptysis, dysphasia, odynophagia, melena, hematochezia, dysuria, hematuria, rash, seizure activity, orthopnea, PND, pedal edema, claudication. Remaining systems are negative.  Physical Exam: Well-developed well-nourished in no acute distress.  Skin is warm and dry.  HEENT is normal.  Neck is supple.  Chest is clear to auscultation with normal expansion.  Cardiovascular exam is regular rate and rhythm.  Abdominal exam nontender or distended. No masses palpated. Extremities show no edema. neuro grossly intact  ECG- personally reviewed  A/P  1 coronary artery disease-patient denies chest pain.  Continue aspirin and statin.  2 peripheral vascular disease-patient with history of abdominal aortic aneurysm as well as aneurysms as described in the HPI.  We will arrange follow-up CTA.  3 hyperlipidemia-continue statin.  4 hypertension-blood pressure controlled.  Continue present medications and follow.  5 tobacco abuse-patient counseled on discontinuing.  6 history of DVT-  Kirk Ruths, MD

## 2020-08-16 DIAGNOSIS — G4700 Insomnia, unspecified: Secondary | ICD-10-CM | POA: Diagnosis not present

## 2020-08-16 DIAGNOSIS — Z7185 Encounter for immunization safety counseling: Secondary | ICD-10-CM | POA: Diagnosis not present

## 2020-08-16 DIAGNOSIS — E119 Type 2 diabetes mellitus without complications: Secondary | ICD-10-CM | POA: Diagnosis not present

## 2020-08-16 DIAGNOSIS — N182 Chronic kidney disease, stage 2 (mild): Secondary | ICD-10-CM | POA: Diagnosis not present

## 2020-08-17 ENCOUNTER — Ambulatory Visit: Payer: Medicare Other | Admitting: Cardiology

## 2020-08-17 ENCOUNTER — Other Ambulatory Visit: Payer: Self-pay

## 2020-09-06 DIAGNOSIS — E119 Type 2 diabetes mellitus without complications: Secondary | ICD-10-CM | POA: Diagnosis not present

## 2020-09-06 DIAGNOSIS — F17210 Nicotine dependence, cigarettes, uncomplicated: Secondary | ICD-10-CM | POA: Diagnosis not present

## 2020-09-06 DIAGNOSIS — F5101 Primary insomnia: Secondary | ICD-10-CM | POA: Diagnosis not present

## 2020-09-06 DIAGNOSIS — R0602 Shortness of breath: Secondary | ICD-10-CM | POA: Diagnosis not present

## 2020-09-06 DIAGNOSIS — G8929 Other chronic pain: Secondary | ICD-10-CM | POA: Diagnosis not present

## 2020-09-06 DIAGNOSIS — M25562 Pain in left knee: Secondary | ICD-10-CM | POA: Diagnosis not present

## 2020-09-06 DIAGNOSIS — N182 Chronic kidney disease, stage 2 (mild): Secondary | ICD-10-CM | POA: Diagnosis not present

## 2020-09-06 DIAGNOSIS — L989 Disorder of the skin and subcutaneous tissue, unspecified: Secondary | ICD-10-CM | POA: Diagnosis not present

## 2020-09-14 DIAGNOSIS — R1312 Dysphagia, oropharyngeal phase: Secondary | ICD-10-CM | POA: Diagnosis not present

## 2020-09-14 DIAGNOSIS — F17200 Nicotine dependence, unspecified, uncomplicated: Secondary | ICD-10-CM | POA: Diagnosis not present

## 2020-09-14 DIAGNOSIS — Z23 Encounter for immunization: Secondary | ICD-10-CM | POA: Diagnosis not present

## 2020-09-14 DIAGNOSIS — I2510 Atherosclerotic heart disease of native coronary artery without angina pectoris: Secondary | ICD-10-CM | POA: Diagnosis not present

## 2020-09-14 DIAGNOSIS — R059 Cough, unspecified: Secondary | ICD-10-CM | POA: Diagnosis not present

## 2020-09-14 DIAGNOSIS — M79604 Pain in right leg: Secondary | ICD-10-CM | POA: Diagnosis not present

## 2020-09-14 DIAGNOSIS — M6281 Muscle weakness (generalized): Secondary | ICD-10-CM | POA: Diagnosis not present

## 2020-09-14 DIAGNOSIS — E876 Hypokalemia: Secondary | ICD-10-CM | POA: Diagnosis not present

## 2020-09-14 DIAGNOSIS — I97191 Other postprocedural cardiac functional disturbances following other surgery: Secondary | ICD-10-CM | POA: Diagnosis not present

## 2020-09-14 DIAGNOSIS — R410 Disorientation, unspecified: Secondary | ICD-10-CM | POA: Diagnosis not present

## 2020-09-14 DIAGNOSIS — I70262 Atherosclerosis of native arteries of extremities with gangrene, left leg: Secondary | ICD-10-CM | POA: Diagnosis not present

## 2020-09-14 DIAGNOSIS — Z4781 Encounter for orthopedic aftercare following surgical amputation: Secondary | ICD-10-CM | POA: Diagnosis not present

## 2020-09-14 DIAGNOSIS — Z889 Allergy status to unspecified drugs, medicaments and biological substances status: Secondary | ICD-10-CM | POA: Diagnosis not present

## 2020-09-14 DIAGNOSIS — I743 Embolism and thrombosis of arteries of the lower extremities: Secondary | ICD-10-CM | POA: Diagnosis not present

## 2020-09-14 DIAGNOSIS — I10 Essential (primary) hypertension: Secondary | ICD-10-CM | POA: Diagnosis not present

## 2020-09-14 DIAGNOSIS — I70222 Atherosclerosis of native arteries of extremities with rest pain, left leg: Secondary | ICD-10-CM | POA: Diagnosis not present

## 2020-09-14 DIAGNOSIS — I252 Old myocardial infarction: Secondary | ICD-10-CM | POA: Diagnosis not present

## 2020-09-14 DIAGNOSIS — I745 Embolism and thrombosis of iliac artery: Secondary | ICD-10-CM | POA: Diagnosis not present

## 2020-09-14 DIAGNOSIS — E785 Hyperlipidemia, unspecified: Secondary | ICD-10-CM | POA: Diagnosis not present

## 2020-09-14 DIAGNOSIS — R52 Pain, unspecified: Secondary | ICD-10-CM | POA: Diagnosis not present

## 2020-09-14 DIAGNOSIS — F17210 Nicotine dependence, cigarettes, uncomplicated: Secondary | ICD-10-CM | POA: Diagnosis not present

## 2020-09-14 DIAGNOSIS — M1990 Unspecified osteoarthritis, unspecified site: Secondary | ICD-10-CM | POA: Diagnosis not present

## 2020-09-14 DIAGNOSIS — Z955 Presence of coronary angioplasty implant and graft: Secondary | ICD-10-CM | POA: Diagnosis not present

## 2020-09-14 DIAGNOSIS — I96 Gangrene, not elsewhere classified: Secondary | ICD-10-CM | POA: Diagnosis not present

## 2020-09-14 DIAGNOSIS — R0902 Hypoxemia: Secondary | ICD-10-CM | POA: Diagnosis not present

## 2020-09-14 DIAGNOSIS — J90 Pleural effusion, not elsewhere classified: Secondary | ICD-10-CM | POA: Diagnosis not present

## 2020-09-14 DIAGNOSIS — Z86010 Personal history of colonic polyps: Secondary | ICD-10-CM | POA: Diagnosis not present

## 2020-09-14 DIAGNOSIS — R350 Frequency of micturition: Secondary | ICD-10-CM | POA: Diagnosis not present

## 2020-09-14 DIAGNOSIS — J449 Chronic obstructive pulmonary disease, unspecified: Secondary | ICD-10-CM | POA: Diagnosis not present

## 2020-09-14 DIAGNOSIS — M79605 Pain in left leg: Secondary | ICD-10-CM | POA: Diagnosis not present

## 2020-09-14 DIAGNOSIS — I7092 Chronic total occlusion of artery of the extremities: Secondary | ICD-10-CM | POA: Diagnosis not present

## 2020-09-14 DIAGNOSIS — I70229 Atherosclerosis of native arteries of extremities with rest pain, unspecified extremity: Secondary | ICD-10-CM | POA: Diagnosis not present

## 2020-09-14 DIAGNOSIS — I998 Other disorder of circulatory system: Secondary | ICD-10-CM | POA: Diagnosis not present

## 2020-09-14 DIAGNOSIS — R918 Other nonspecific abnormal finding of lung field: Secondary | ICD-10-CM | POA: Diagnosis not present

## 2020-09-14 DIAGNOSIS — E559 Vitamin D deficiency, unspecified: Secondary | ICD-10-CM | POA: Diagnosis not present

## 2020-09-14 DIAGNOSIS — I4891 Unspecified atrial fibrillation: Secondary | ICD-10-CM | POA: Diagnosis not present

## 2020-09-14 DIAGNOSIS — E119 Type 2 diabetes mellitus without complications: Secondary | ICD-10-CM | POA: Diagnosis not present

## 2020-09-14 DIAGNOSIS — I4892 Unspecified atrial flutter: Secondary | ICD-10-CM | POA: Diagnosis not present

## 2020-09-14 DIAGNOSIS — I517 Cardiomegaly: Secondary | ICD-10-CM | POA: Diagnosis not present

## 2020-09-14 DIAGNOSIS — R262 Difficulty in walking, not elsewhere classified: Secondary | ICD-10-CM | POA: Diagnosis not present

## 2020-09-14 DIAGNOSIS — I9789 Other postprocedural complications and disorders of the circulatory system, not elsewhere classified: Secondary | ICD-10-CM | POA: Diagnosis not present

## 2020-09-14 DIAGNOSIS — I708 Atherosclerosis of other arteries: Secondary | ICD-10-CM | POA: Diagnosis not present

## 2020-09-14 DIAGNOSIS — Z880 Allergy status to penicillin: Secondary | ICD-10-CM | POA: Diagnosis not present

## 2020-09-14 DIAGNOSIS — Z743 Need for continuous supervision: Secondary | ICD-10-CM | POA: Diagnosis not present

## 2020-09-14 DIAGNOSIS — I700 Atherosclerosis of aorta: Secondary | ICD-10-CM | POA: Diagnosis not present

## 2020-09-14 DIAGNOSIS — I70202 Unspecified atherosclerosis of native arteries of extremities, left leg: Secondary | ICD-10-CM | POA: Diagnosis not present

## 2020-09-14 DIAGNOSIS — Z7984 Long term (current) use of oral hypoglycemic drugs: Secondary | ICD-10-CM | POA: Diagnosis not present

## 2020-09-14 DIAGNOSIS — I70203 Unspecified atherosclerosis of native arteries of extremities, bilateral legs: Secondary | ICD-10-CM | POA: Diagnosis not present

## 2020-09-17 DIAGNOSIS — I70229 Atherosclerosis of native arteries of extremities with rest pain, unspecified extremity: Secondary | ICD-10-CM | POA: Diagnosis not present

## 2020-09-18 DIAGNOSIS — R059 Cough, unspecified: Secondary | ICD-10-CM | POA: Diagnosis not present

## 2020-09-18 DIAGNOSIS — I517 Cardiomegaly: Secondary | ICD-10-CM | POA: Diagnosis not present

## 2020-09-19 DIAGNOSIS — I998 Other disorder of circulatory system: Secondary | ICD-10-CM | POA: Diagnosis not present

## 2020-09-19 DIAGNOSIS — I4891 Unspecified atrial fibrillation: Secondary | ICD-10-CM | POA: Diagnosis not present

## 2020-09-19 DIAGNOSIS — I9789 Other postprocedural complications and disorders of the circulatory system, not elsewhere classified: Secondary | ICD-10-CM | POA: Diagnosis not present

## 2020-09-20 DIAGNOSIS — I9789 Other postprocedural complications and disorders of the circulatory system, not elsewhere classified: Secondary | ICD-10-CM | POA: Diagnosis not present

## 2020-09-20 DIAGNOSIS — I70229 Atherosclerosis of native arteries of extremities with rest pain, unspecified extremity: Secondary | ICD-10-CM | POA: Diagnosis not present

## 2020-09-21 DIAGNOSIS — I998 Other disorder of circulatory system: Secondary | ICD-10-CM | POA: Diagnosis not present

## 2020-09-21 DIAGNOSIS — I70229 Atherosclerosis of native arteries of extremities with rest pain, unspecified extremity: Secondary | ICD-10-CM | POA: Diagnosis not present

## 2020-10-06 DIAGNOSIS — R7309 Other abnormal glucose: Secondary | ICD-10-CM | POA: Diagnosis not present

## 2020-10-06 DIAGNOSIS — R262 Difficulty in walking, not elsewhere classified: Secondary | ICD-10-CM | POA: Diagnosis not present

## 2020-10-06 DIAGNOSIS — R1312 Dysphagia, oropharyngeal phase: Secondary | ICD-10-CM | POA: Diagnosis not present

## 2020-10-06 DIAGNOSIS — R0902 Hypoxemia: Secondary | ICD-10-CM | POA: Diagnosis not present

## 2020-10-06 DIAGNOSIS — Z743 Need for continuous supervision: Secondary | ICD-10-CM | POA: Diagnosis not present

## 2020-10-06 DIAGNOSIS — S78119A Complete traumatic amputation at level between unspecified hip and knee, initial encounter: Secondary | ICD-10-CM | POA: Diagnosis not present

## 2020-10-06 DIAGNOSIS — W19XXXA Unspecified fall, initial encounter: Secondary | ICD-10-CM | POA: Diagnosis not present

## 2020-10-06 DIAGNOSIS — I4891 Unspecified atrial fibrillation: Secondary | ICD-10-CM | POA: Diagnosis not present

## 2020-10-06 DIAGNOSIS — I70222 Atherosclerosis of native arteries of extremities with rest pain, left leg: Secondary | ICD-10-CM | POA: Diagnosis not present

## 2020-10-06 DIAGNOSIS — I749 Embolism and thrombosis of unspecified artery: Secondary | ICD-10-CM | POA: Diagnosis not present

## 2020-10-06 DIAGNOSIS — I4892 Unspecified atrial flutter: Secondary | ICD-10-CM | POA: Diagnosis not present

## 2020-10-06 DIAGNOSIS — E1159 Type 2 diabetes mellitus with other circulatory complications: Secondary | ICD-10-CM | POA: Diagnosis not present

## 2020-10-06 DIAGNOSIS — R52 Pain, unspecified: Secondary | ICD-10-CM | POA: Diagnosis not present

## 2020-10-06 DIAGNOSIS — R410 Disorientation, unspecified: Secondary | ICD-10-CM | POA: Diagnosis not present

## 2020-10-06 DIAGNOSIS — Z4781 Encounter for orthopedic aftercare following surgical amputation: Secondary | ICD-10-CM | POA: Diagnosis not present

## 2020-10-06 DIAGNOSIS — I152 Hypertension secondary to endocrine disorders: Secondary | ICD-10-CM | POA: Diagnosis not present

## 2020-10-06 DIAGNOSIS — M6281 Muscle weakness (generalized): Secondary | ICD-10-CM | POA: Diagnosis not present

## 2020-10-06 DIAGNOSIS — I739 Peripheral vascular disease, unspecified: Secondary | ICD-10-CM | POA: Diagnosis not present

## 2020-10-06 DIAGNOSIS — J449 Chronic obstructive pulmonary disease, unspecified: Secondary | ICD-10-CM | POA: Diagnosis not present

## 2020-10-06 DIAGNOSIS — R799 Abnormal finding of blood chemistry, unspecified: Secondary | ICD-10-CM | POA: Diagnosis not present

## 2020-10-06 DIAGNOSIS — D519 Vitamin B12 deficiency anemia, unspecified: Secondary | ICD-10-CM | POA: Diagnosis not present

## 2020-10-07 DIAGNOSIS — R410 Disorientation, unspecified: Secondary | ICD-10-CM | POA: Diagnosis not present

## 2020-10-07 DIAGNOSIS — E1159 Type 2 diabetes mellitus with other circulatory complications: Secondary | ICD-10-CM | POA: Diagnosis not present

## 2020-10-07 DIAGNOSIS — J449 Chronic obstructive pulmonary disease, unspecified: Secondary | ICD-10-CM | POA: Diagnosis not present

## 2020-10-07 DIAGNOSIS — S78119A Complete traumatic amputation at level between unspecified hip and knee, initial encounter: Secondary | ICD-10-CM | POA: Diagnosis not present

## 2020-10-07 DIAGNOSIS — I152 Hypertension secondary to endocrine disorders: Secondary | ICD-10-CM | POA: Diagnosis not present

## 2020-10-07 DIAGNOSIS — I70222 Atherosclerosis of native arteries of extremities with rest pain, left leg: Secondary | ICD-10-CM | POA: Diagnosis not present

## 2020-10-07 DIAGNOSIS — I749 Embolism and thrombosis of unspecified artery: Secondary | ICD-10-CM | POA: Diagnosis not present

## 2020-10-07 DIAGNOSIS — I4891 Unspecified atrial fibrillation: Secondary | ICD-10-CM | POA: Diagnosis not present

## 2020-10-07 DIAGNOSIS — I739 Peripheral vascular disease, unspecified: Secondary | ICD-10-CM | POA: Diagnosis not present

## 2020-10-07 DIAGNOSIS — I4892 Unspecified atrial flutter: Secondary | ICD-10-CM | POA: Diagnosis not present

## 2020-10-10 DIAGNOSIS — D519 Vitamin B12 deficiency anemia, unspecified: Secondary | ICD-10-CM | POA: Diagnosis not present

## 2020-10-10 DIAGNOSIS — R7309 Other abnormal glucose: Secondary | ICD-10-CM | POA: Diagnosis not present

## 2020-10-10 DIAGNOSIS — R799 Abnormal finding of blood chemistry, unspecified: Secondary | ICD-10-CM | POA: Diagnosis not present

## 2020-10-11 DIAGNOSIS — I4891 Unspecified atrial fibrillation: Secondary | ICD-10-CM | POA: Diagnosis not present

## 2020-10-11 DIAGNOSIS — R410 Disorientation, unspecified: Secondary | ICD-10-CM | POA: Diagnosis not present

## 2020-10-11 DIAGNOSIS — I739 Peripheral vascular disease, unspecified: Secondary | ICD-10-CM | POA: Diagnosis not present

## 2020-10-11 DIAGNOSIS — E1159 Type 2 diabetes mellitus with other circulatory complications: Secondary | ICD-10-CM | POA: Diagnosis not present

## 2020-10-11 DIAGNOSIS — S78119A Complete traumatic amputation at level between unspecified hip and knee, initial encounter: Secondary | ICD-10-CM | POA: Diagnosis not present

## 2020-10-11 DIAGNOSIS — J449 Chronic obstructive pulmonary disease, unspecified: Secondary | ICD-10-CM | POA: Diagnosis not present

## 2020-10-13 DIAGNOSIS — I70222 Atherosclerosis of native arteries of extremities with rest pain, left leg: Secondary | ICD-10-CM | POA: Diagnosis not present

## 2020-10-13 DIAGNOSIS — I152 Hypertension secondary to endocrine disorders: Secondary | ICD-10-CM | POA: Diagnosis not present

## 2020-10-13 DIAGNOSIS — S78119A Complete traumatic amputation at level between unspecified hip and knee, initial encounter: Secondary | ICD-10-CM | POA: Diagnosis not present

## 2020-10-13 DIAGNOSIS — W19XXXA Unspecified fall, initial encounter: Secondary | ICD-10-CM | POA: Diagnosis not present

## 2020-10-24 DIAGNOSIS — R296 Repeated falls: Secondary | ICD-10-CM | POA: Diagnosis not present

## 2020-10-24 DIAGNOSIS — R58 Hemorrhage, not elsewhere classified: Secondary | ICD-10-CM | POA: Diagnosis not present

## 2020-10-24 DIAGNOSIS — S81819A Laceration without foreign body, unspecified lower leg, initial encounter: Secondary | ICD-10-CM | POA: Diagnosis not present

## 2020-11-02 DIAGNOSIS — R262 Difficulty in walking, not elsewhere classified: Secondary | ICD-10-CM | POA: Diagnosis not present

## 2020-11-02 DIAGNOSIS — Z4781 Encounter for orthopedic aftercare following surgical amputation: Secondary | ICD-10-CM | POA: Diagnosis not present

## 2020-11-02 DIAGNOSIS — M6281 Muscle weakness (generalized): Secondary | ICD-10-CM | POA: Diagnosis not present

## 2020-11-02 DIAGNOSIS — R1312 Dysphagia, oropharyngeal phase: Secondary | ICD-10-CM | POA: Diagnosis not present

## 2020-11-02 DIAGNOSIS — G3281 Cerebellar ataxia in diseases classified elsewhere: Secondary | ICD-10-CM | POA: Diagnosis not present

## 2020-11-02 DIAGNOSIS — Z9181 History of falling: Secondary | ICD-10-CM | POA: Diagnosis not present

## 2020-11-03 DIAGNOSIS — Z9181 History of falling: Secondary | ICD-10-CM | POA: Diagnosis not present

## 2020-11-03 DIAGNOSIS — R262 Difficulty in walking, not elsewhere classified: Secondary | ICD-10-CM | POA: Diagnosis not present

## 2020-11-03 DIAGNOSIS — G3281 Cerebellar ataxia in diseases classified elsewhere: Secondary | ICD-10-CM | POA: Diagnosis not present

## 2020-11-03 DIAGNOSIS — R1312 Dysphagia, oropharyngeal phase: Secondary | ICD-10-CM | POA: Diagnosis not present

## 2020-11-03 DIAGNOSIS — M6281 Muscle weakness (generalized): Secondary | ICD-10-CM | POA: Diagnosis not present

## 2020-11-03 DIAGNOSIS — Z4781 Encounter for orthopedic aftercare following surgical amputation: Secondary | ICD-10-CM | POA: Diagnosis not present

## 2020-11-07 DIAGNOSIS — R262 Difficulty in walking, not elsewhere classified: Secondary | ICD-10-CM | POA: Diagnosis not present

## 2020-11-07 DIAGNOSIS — G3281 Cerebellar ataxia in diseases classified elsewhere: Secondary | ICD-10-CM | POA: Diagnosis not present

## 2020-11-07 DIAGNOSIS — Z9181 History of falling: Secondary | ICD-10-CM | POA: Diagnosis not present

## 2020-11-07 DIAGNOSIS — Z4781 Encounter for orthopedic aftercare following surgical amputation: Secondary | ICD-10-CM | POA: Diagnosis not present

## 2020-11-07 DIAGNOSIS — R1312 Dysphagia, oropharyngeal phase: Secondary | ICD-10-CM | POA: Diagnosis not present

## 2020-11-07 DIAGNOSIS — M6281 Muscle weakness (generalized): Secondary | ICD-10-CM | POA: Diagnosis not present

## 2020-11-08 DIAGNOSIS — I152 Hypertension secondary to endocrine disorders: Secondary | ICD-10-CM | POA: Diagnosis not present

## 2020-11-08 DIAGNOSIS — I4891 Unspecified atrial fibrillation: Secondary | ICD-10-CM | POA: Diagnosis not present

## 2020-11-08 DIAGNOSIS — M6281 Muscle weakness (generalized): Secondary | ICD-10-CM | POA: Diagnosis not present

## 2020-11-08 DIAGNOSIS — Z9181 History of falling: Secondary | ICD-10-CM | POA: Diagnosis not present

## 2020-11-08 DIAGNOSIS — R1312 Dysphagia, oropharyngeal phase: Secondary | ICD-10-CM | POA: Diagnosis not present

## 2020-11-08 DIAGNOSIS — Z4781 Encounter for orthopedic aftercare following surgical amputation: Secondary | ICD-10-CM | POA: Diagnosis not present

## 2020-11-08 DIAGNOSIS — I739 Peripheral vascular disease, unspecified: Secondary | ICD-10-CM | POA: Diagnosis not present

## 2020-11-08 DIAGNOSIS — G3281 Cerebellar ataxia in diseases classified elsewhere: Secondary | ICD-10-CM | POA: Diagnosis not present

## 2020-11-08 DIAGNOSIS — R2689 Other abnormalities of gait and mobility: Secondary | ICD-10-CM | POA: Diagnosis not present

## 2020-11-08 DIAGNOSIS — E1159 Type 2 diabetes mellitus with other circulatory complications: Secondary | ICD-10-CM | POA: Diagnosis not present

## 2020-11-08 DIAGNOSIS — R262 Difficulty in walking, not elsewhere classified: Secondary | ICD-10-CM | POA: Diagnosis not present

## 2020-11-09 DIAGNOSIS — M6281 Muscle weakness (generalized): Secondary | ICD-10-CM | POA: Diagnosis not present

## 2020-11-09 DIAGNOSIS — Z9181 History of falling: Secondary | ICD-10-CM | POA: Diagnosis not present

## 2020-11-09 DIAGNOSIS — R262 Difficulty in walking, not elsewhere classified: Secondary | ICD-10-CM | POA: Diagnosis not present

## 2020-11-09 DIAGNOSIS — G3281 Cerebellar ataxia in diseases classified elsewhere: Secondary | ICD-10-CM | POA: Diagnosis not present

## 2020-11-09 DIAGNOSIS — R2689 Other abnormalities of gait and mobility: Secondary | ICD-10-CM | POA: Diagnosis not present

## 2020-11-09 DIAGNOSIS — R1312 Dysphagia, oropharyngeal phase: Secondary | ICD-10-CM | POA: Diagnosis not present

## 2020-11-09 DIAGNOSIS — Z4781 Encounter for orthopedic aftercare following surgical amputation: Secondary | ICD-10-CM | POA: Diagnosis not present

## 2020-11-10 DIAGNOSIS — R1312 Dysphagia, oropharyngeal phase: Secondary | ICD-10-CM | POA: Diagnosis not present

## 2020-11-10 DIAGNOSIS — Z4781 Encounter for orthopedic aftercare following surgical amputation: Secondary | ICD-10-CM | POA: Diagnosis not present

## 2020-11-10 DIAGNOSIS — M6281 Muscle weakness (generalized): Secondary | ICD-10-CM | POA: Diagnosis not present

## 2020-11-10 DIAGNOSIS — R2689 Other abnormalities of gait and mobility: Secondary | ICD-10-CM | POA: Diagnosis not present

## 2020-11-10 DIAGNOSIS — G3281 Cerebellar ataxia in diseases classified elsewhere: Secondary | ICD-10-CM | POA: Diagnosis not present

## 2020-11-10 DIAGNOSIS — R262 Difficulty in walking, not elsewhere classified: Secondary | ICD-10-CM | POA: Diagnosis not present

## 2020-11-10 DIAGNOSIS — Z9181 History of falling: Secondary | ICD-10-CM | POA: Diagnosis not present

## 2020-11-11 DIAGNOSIS — G3281 Cerebellar ataxia in diseases classified elsewhere: Secondary | ICD-10-CM | POA: Diagnosis not present

## 2020-11-11 DIAGNOSIS — M6281 Muscle weakness (generalized): Secondary | ICD-10-CM | POA: Diagnosis not present

## 2020-11-11 DIAGNOSIS — R1312 Dysphagia, oropharyngeal phase: Secondary | ICD-10-CM | POA: Diagnosis not present

## 2020-11-11 DIAGNOSIS — Z4781 Encounter for orthopedic aftercare following surgical amputation: Secondary | ICD-10-CM | POA: Diagnosis not present

## 2020-11-11 DIAGNOSIS — Z9181 History of falling: Secondary | ICD-10-CM | POA: Diagnosis not present

## 2020-11-11 DIAGNOSIS — R262 Difficulty in walking, not elsewhere classified: Secondary | ICD-10-CM | POA: Diagnosis not present

## 2020-11-11 DIAGNOSIS — R2689 Other abnormalities of gait and mobility: Secondary | ICD-10-CM | POA: Diagnosis not present

## 2020-11-14 DIAGNOSIS — R2689 Other abnormalities of gait and mobility: Secondary | ICD-10-CM | POA: Diagnosis not present

## 2020-11-14 DIAGNOSIS — R1312 Dysphagia, oropharyngeal phase: Secondary | ICD-10-CM | POA: Diagnosis not present

## 2020-11-14 DIAGNOSIS — M6281 Muscle weakness (generalized): Secondary | ICD-10-CM | POA: Diagnosis not present

## 2020-11-14 DIAGNOSIS — Z9181 History of falling: Secondary | ICD-10-CM | POA: Diagnosis not present

## 2020-11-14 DIAGNOSIS — G3281 Cerebellar ataxia in diseases classified elsewhere: Secondary | ICD-10-CM | POA: Diagnosis not present

## 2020-11-14 DIAGNOSIS — Z4781 Encounter for orthopedic aftercare following surgical amputation: Secondary | ICD-10-CM | POA: Diagnosis not present

## 2020-11-14 DIAGNOSIS — R262 Difficulty in walking, not elsewhere classified: Secondary | ICD-10-CM | POA: Diagnosis not present

## 2020-11-15 DIAGNOSIS — Z9181 History of falling: Secondary | ICD-10-CM | POA: Diagnosis not present

## 2020-11-15 DIAGNOSIS — M6281 Muscle weakness (generalized): Secondary | ICD-10-CM | POA: Diagnosis not present

## 2020-11-15 DIAGNOSIS — R1312 Dysphagia, oropharyngeal phase: Secondary | ICD-10-CM | POA: Diagnosis not present

## 2020-11-15 DIAGNOSIS — G3281 Cerebellar ataxia in diseases classified elsewhere: Secondary | ICD-10-CM | POA: Diagnosis not present

## 2020-11-15 DIAGNOSIS — Z4781 Encounter for orthopedic aftercare following surgical amputation: Secondary | ICD-10-CM | POA: Diagnosis not present

## 2020-11-15 DIAGNOSIS — R2689 Other abnormalities of gait and mobility: Secondary | ICD-10-CM | POA: Diagnosis not present

## 2020-11-15 DIAGNOSIS — R262 Difficulty in walking, not elsewhere classified: Secondary | ICD-10-CM | POA: Diagnosis not present

## 2020-11-16 DIAGNOSIS — Z9181 History of falling: Secondary | ICD-10-CM | POA: Diagnosis not present

## 2020-11-16 DIAGNOSIS — M6281 Muscle weakness (generalized): Secondary | ICD-10-CM | POA: Diagnosis not present

## 2020-11-16 DIAGNOSIS — R1312 Dysphagia, oropharyngeal phase: Secondary | ICD-10-CM | POA: Diagnosis not present

## 2020-11-16 DIAGNOSIS — R2689 Other abnormalities of gait and mobility: Secondary | ICD-10-CM | POA: Diagnosis not present

## 2020-11-16 DIAGNOSIS — Z4781 Encounter for orthopedic aftercare following surgical amputation: Secondary | ICD-10-CM | POA: Diagnosis not present

## 2020-11-16 DIAGNOSIS — R262 Difficulty in walking, not elsewhere classified: Secondary | ICD-10-CM | POA: Diagnosis not present

## 2020-11-16 DIAGNOSIS — G3281 Cerebellar ataxia in diseases classified elsewhere: Secondary | ICD-10-CM | POA: Diagnosis not present

## 2020-11-17 DIAGNOSIS — G3281 Cerebellar ataxia in diseases classified elsewhere: Secondary | ICD-10-CM | POA: Diagnosis not present

## 2020-11-17 DIAGNOSIS — Z9181 History of falling: Secondary | ICD-10-CM | POA: Diagnosis not present

## 2020-11-17 DIAGNOSIS — R262 Difficulty in walking, not elsewhere classified: Secondary | ICD-10-CM | POA: Diagnosis not present

## 2020-11-17 DIAGNOSIS — Z4781 Encounter for orthopedic aftercare following surgical amputation: Secondary | ICD-10-CM | POA: Diagnosis not present

## 2020-11-17 DIAGNOSIS — R1312 Dysphagia, oropharyngeal phase: Secondary | ICD-10-CM | POA: Diagnosis not present

## 2020-11-17 DIAGNOSIS — R2689 Other abnormalities of gait and mobility: Secondary | ICD-10-CM | POA: Diagnosis not present

## 2020-11-17 DIAGNOSIS — M6281 Muscle weakness (generalized): Secondary | ICD-10-CM | POA: Diagnosis not present

## 2020-11-18 DIAGNOSIS — Z4781 Encounter for orthopedic aftercare following surgical amputation: Secondary | ICD-10-CM | POA: Diagnosis not present

## 2020-11-18 DIAGNOSIS — R262 Difficulty in walking, not elsewhere classified: Secondary | ICD-10-CM | POA: Diagnosis not present

## 2020-11-18 DIAGNOSIS — R2689 Other abnormalities of gait and mobility: Secondary | ICD-10-CM | POA: Diagnosis not present

## 2020-11-18 DIAGNOSIS — M6281 Muscle weakness (generalized): Secondary | ICD-10-CM | POA: Diagnosis not present

## 2020-11-18 DIAGNOSIS — R1312 Dysphagia, oropharyngeal phase: Secondary | ICD-10-CM | POA: Diagnosis not present

## 2020-11-18 DIAGNOSIS — Z9181 History of falling: Secondary | ICD-10-CM | POA: Diagnosis not present

## 2020-11-18 DIAGNOSIS — G3281 Cerebellar ataxia in diseases classified elsewhere: Secondary | ICD-10-CM | POA: Diagnosis not present

## 2020-11-21 DIAGNOSIS — M6281 Muscle weakness (generalized): Secondary | ICD-10-CM | POA: Diagnosis not present

## 2020-11-21 DIAGNOSIS — R262 Difficulty in walking, not elsewhere classified: Secondary | ICD-10-CM | POA: Diagnosis not present

## 2020-11-21 DIAGNOSIS — R1312 Dysphagia, oropharyngeal phase: Secondary | ICD-10-CM | POA: Diagnosis not present

## 2020-11-21 DIAGNOSIS — G3281 Cerebellar ataxia in diseases classified elsewhere: Secondary | ICD-10-CM | POA: Diagnosis not present

## 2020-11-21 DIAGNOSIS — Z4781 Encounter for orthopedic aftercare following surgical amputation: Secondary | ICD-10-CM | POA: Diagnosis not present

## 2020-11-21 DIAGNOSIS — Z9181 History of falling: Secondary | ICD-10-CM | POA: Diagnosis not present

## 2020-11-21 DIAGNOSIS — R2689 Other abnormalities of gait and mobility: Secondary | ICD-10-CM | POA: Diagnosis not present

## 2020-11-22 DIAGNOSIS — M6281 Muscle weakness (generalized): Secondary | ICD-10-CM | POA: Diagnosis not present

## 2020-11-22 DIAGNOSIS — R1312 Dysphagia, oropharyngeal phase: Secondary | ICD-10-CM | POA: Diagnosis not present

## 2020-11-22 DIAGNOSIS — R2689 Other abnormalities of gait and mobility: Secondary | ICD-10-CM | POA: Diagnosis not present

## 2020-11-22 DIAGNOSIS — Z4781 Encounter for orthopedic aftercare following surgical amputation: Secondary | ICD-10-CM | POA: Diagnosis not present

## 2020-11-22 DIAGNOSIS — Z9181 History of falling: Secondary | ICD-10-CM | POA: Diagnosis not present

## 2020-11-22 DIAGNOSIS — G3281 Cerebellar ataxia in diseases classified elsewhere: Secondary | ICD-10-CM | POA: Diagnosis not present

## 2020-11-22 DIAGNOSIS — R262 Difficulty in walking, not elsewhere classified: Secondary | ICD-10-CM | POA: Diagnosis not present

## 2020-11-23 DIAGNOSIS — M6281 Muscle weakness (generalized): Secondary | ICD-10-CM | POA: Diagnosis not present

## 2020-11-23 DIAGNOSIS — Z9181 History of falling: Secondary | ICD-10-CM | POA: Diagnosis not present

## 2020-11-23 DIAGNOSIS — R2689 Other abnormalities of gait and mobility: Secondary | ICD-10-CM | POA: Diagnosis not present

## 2020-11-23 DIAGNOSIS — G3281 Cerebellar ataxia in diseases classified elsewhere: Secondary | ICD-10-CM | POA: Diagnosis not present

## 2020-11-23 DIAGNOSIS — R262 Difficulty in walking, not elsewhere classified: Secondary | ICD-10-CM | POA: Diagnosis not present

## 2020-11-23 DIAGNOSIS — Z4781 Encounter for orthopedic aftercare following surgical amputation: Secondary | ICD-10-CM | POA: Diagnosis not present

## 2020-11-23 DIAGNOSIS — R1312 Dysphagia, oropharyngeal phase: Secondary | ICD-10-CM | POA: Diagnosis not present

## 2020-11-24 DIAGNOSIS — G3281 Cerebellar ataxia in diseases classified elsewhere: Secondary | ICD-10-CM | POA: Diagnosis not present

## 2020-11-24 DIAGNOSIS — R2689 Other abnormalities of gait and mobility: Secondary | ICD-10-CM | POA: Diagnosis not present

## 2020-11-24 DIAGNOSIS — R262 Difficulty in walking, not elsewhere classified: Secondary | ICD-10-CM | POA: Diagnosis not present

## 2020-11-24 DIAGNOSIS — M6281 Muscle weakness (generalized): Secondary | ICD-10-CM | POA: Diagnosis not present

## 2020-11-24 DIAGNOSIS — R1312 Dysphagia, oropharyngeal phase: Secondary | ICD-10-CM | POA: Diagnosis not present

## 2020-11-24 DIAGNOSIS — Z9181 History of falling: Secondary | ICD-10-CM | POA: Diagnosis not present

## 2020-11-24 DIAGNOSIS — Z4781 Encounter for orthopedic aftercare following surgical amputation: Secondary | ICD-10-CM | POA: Diagnosis not present

## 2020-11-25 DIAGNOSIS — M6281 Muscle weakness (generalized): Secondary | ICD-10-CM | POA: Diagnosis not present

## 2020-11-25 DIAGNOSIS — G3281 Cerebellar ataxia in diseases classified elsewhere: Secondary | ICD-10-CM | POA: Diagnosis not present

## 2020-11-25 DIAGNOSIS — R2689 Other abnormalities of gait and mobility: Secondary | ICD-10-CM | POA: Diagnosis not present

## 2020-11-25 DIAGNOSIS — Z9181 History of falling: Secondary | ICD-10-CM | POA: Diagnosis not present

## 2020-11-25 DIAGNOSIS — R262 Difficulty in walking, not elsewhere classified: Secondary | ICD-10-CM | POA: Diagnosis not present

## 2020-11-25 DIAGNOSIS — R1312 Dysphagia, oropharyngeal phase: Secondary | ICD-10-CM | POA: Diagnosis not present

## 2020-11-25 DIAGNOSIS — Z4781 Encounter for orthopedic aftercare following surgical amputation: Secondary | ICD-10-CM | POA: Diagnosis not present

## 2020-11-29 DIAGNOSIS — R262 Difficulty in walking, not elsewhere classified: Secondary | ICD-10-CM | POA: Diagnosis not present

## 2020-11-29 DIAGNOSIS — I152 Hypertension secondary to endocrine disorders: Secondary | ICD-10-CM | POA: Diagnosis not present

## 2020-11-29 DIAGNOSIS — Z9181 History of falling: Secondary | ICD-10-CM | POA: Diagnosis not present

## 2020-11-29 DIAGNOSIS — I2510 Atherosclerotic heart disease of native coronary artery without angina pectoris: Secondary | ICD-10-CM | POA: Diagnosis not present

## 2020-11-29 DIAGNOSIS — E1159 Type 2 diabetes mellitus with other circulatory complications: Secondary | ICD-10-CM | POA: Diagnosis not present

## 2020-11-29 DIAGNOSIS — R2689 Other abnormalities of gait and mobility: Secondary | ICD-10-CM | POA: Diagnosis not present

## 2020-11-29 DIAGNOSIS — Z4781 Encounter for orthopedic aftercare following surgical amputation: Secondary | ICD-10-CM | POA: Diagnosis not present

## 2020-11-29 DIAGNOSIS — M6281 Muscle weakness (generalized): Secondary | ICD-10-CM | POA: Diagnosis not present

## 2020-11-29 DIAGNOSIS — J449 Chronic obstructive pulmonary disease, unspecified: Secondary | ICD-10-CM | POA: Diagnosis not present

## 2020-11-29 DIAGNOSIS — R1312 Dysphagia, oropharyngeal phase: Secondary | ICD-10-CM | POA: Diagnosis not present

## 2020-11-29 DIAGNOSIS — R52 Pain, unspecified: Secondary | ICD-10-CM | POA: Diagnosis not present

## 2020-11-29 DIAGNOSIS — S78119A Complete traumatic amputation at level between unspecified hip and knee, initial encounter: Secondary | ICD-10-CM | POA: Diagnosis not present

## 2020-11-29 DIAGNOSIS — I749 Embolism and thrombosis of unspecified artery: Secondary | ICD-10-CM | POA: Diagnosis not present

## 2020-11-29 DIAGNOSIS — I4891 Unspecified atrial fibrillation: Secondary | ICD-10-CM | POA: Diagnosis not present

## 2020-11-29 DIAGNOSIS — I4892 Unspecified atrial flutter: Secondary | ICD-10-CM | POA: Diagnosis not present

## 2020-11-29 DIAGNOSIS — G3281 Cerebellar ataxia in diseases classified elsewhere: Secondary | ICD-10-CM | POA: Diagnosis not present

## 2020-11-29 DIAGNOSIS — I739 Peripheral vascular disease, unspecified: Secondary | ICD-10-CM | POA: Diagnosis not present

## 2020-11-30 DIAGNOSIS — I10 Essential (primary) hypertension: Secondary | ICD-10-CM | POA: Diagnosis not present

## 2020-12-05 ENCOUNTER — Encounter (INDEPENDENT_AMBULATORY_CARE_PROVIDER_SITE_OTHER): Payer: Self-pay | Admitting: *Deleted

## 2020-12-05 DIAGNOSIS — Z9181 History of falling: Secondary | ICD-10-CM | POA: Diagnosis not present

## 2020-12-05 DIAGNOSIS — G3281 Cerebellar ataxia in diseases classified elsewhere: Secondary | ICD-10-CM | POA: Diagnosis not present

## 2020-12-05 DIAGNOSIS — R2689 Other abnormalities of gait and mobility: Secondary | ICD-10-CM | POA: Diagnosis not present

## 2020-12-05 DIAGNOSIS — Z4781 Encounter for orthopedic aftercare following surgical amputation: Secondary | ICD-10-CM | POA: Diagnosis not present

## 2020-12-05 DIAGNOSIS — R262 Difficulty in walking, not elsewhere classified: Secondary | ICD-10-CM | POA: Diagnosis not present

## 2020-12-05 DIAGNOSIS — M6281 Muscle weakness (generalized): Secondary | ICD-10-CM | POA: Diagnosis not present

## 2020-12-05 DIAGNOSIS — R1312 Dysphagia, oropharyngeal phase: Secondary | ICD-10-CM | POA: Diagnosis not present

## 2020-12-06 DIAGNOSIS — Z4781 Encounter for orthopedic aftercare following surgical amputation: Secondary | ICD-10-CM | POA: Diagnosis not present

## 2020-12-06 DIAGNOSIS — R262 Difficulty in walking, not elsewhere classified: Secondary | ICD-10-CM | POA: Diagnosis not present

## 2020-12-06 DIAGNOSIS — R2689 Other abnormalities of gait and mobility: Secondary | ICD-10-CM | POA: Diagnosis not present

## 2020-12-06 DIAGNOSIS — G3281 Cerebellar ataxia in diseases classified elsewhere: Secondary | ICD-10-CM | POA: Diagnosis not present

## 2020-12-06 DIAGNOSIS — M6281 Muscle weakness (generalized): Secondary | ICD-10-CM | POA: Diagnosis not present

## 2020-12-06 DIAGNOSIS — R1312 Dysphagia, oropharyngeal phase: Secondary | ICD-10-CM | POA: Diagnosis not present

## 2020-12-06 DIAGNOSIS — Z9181 History of falling: Secondary | ICD-10-CM | POA: Diagnosis not present

## 2020-12-07 DIAGNOSIS — Z9181 History of falling: Secondary | ICD-10-CM | POA: Diagnosis not present

## 2020-12-07 DIAGNOSIS — Z4781 Encounter for orthopedic aftercare following surgical amputation: Secondary | ICD-10-CM | POA: Diagnosis not present

## 2020-12-07 DIAGNOSIS — R2689 Other abnormalities of gait and mobility: Secondary | ICD-10-CM | POA: Diagnosis not present

## 2020-12-07 DIAGNOSIS — G3281 Cerebellar ataxia in diseases classified elsewhere: Secondary | ICD-10-CM | POA: Diagnosis not present

## 2020-12-07 DIAGNOSIS — M6281 Muscle weakness (generalized): Secondary | ICD-10-CM | POA: Diagnosis not present

## 2020-12-07 DIAGNOSIS — R262 Difficulty in walking, not elsewhere classified: Secondary | ICD-10-CM | POA: Diagnosis not present

## 2020-12-07 DIAGNOSIS — R1312 Dysphagia, oropharyngeal phase: Secondary | ICD-10-CM | POA: Diagnosis not present

## 2020-12-08 DIAGNOSIS — G3281 Cerebellar ataxia in diseases classified elsewhere: Secondary | ICD-10-CM | POA: Diagnosis not present

## 2020-12-08 DIAGNOSIS — M6281 Muscle weakness (generalized): Secondary | ICD-10-CM | POA: Diagnosis not present

## 2020-12-08 DIAGNOSIS — R2689 Other abnormalities of gait and mobility: Secondary | ICD-10-CM | POA: Diagnosis not present

## 2020-12-08 DIAGNOSIS — R1312 Dysphagia, oropharyngeal phase: Secondary | ICD-10-CM | POA: Diagnosis not present

## 2020-12-08 DIAGNOSIS — Z89619 Acquired absence of unspecified leg above knee: Secondary | ICD-10-CM | POA: Diagnosis not present

## 2020-12-08 DIAGNOSIS — Z9181 History of falling: Secondary | ICD-10-CM | POA: Diagnosis not present

## 2020-12-08 DIAGNOSIS — Z4781 Encounter for orthopedic aftercare following surgical amputation: Secondary | ICD-10-CM | POA: Diagnosis not present

## 2020-12-08 DIAGNOSIS — R262 Difficulty in walking, not elsewhere classified: Secondary | ICD-10-CM | POA: Diagnosis not present

## 2020-12-11 ENCOUNTER — Other Ambulatory Visit: Payer: Self-pay | Admitting: Family Medicine

## 2021-01-09 DIAGNOSIS — Z4781 Encounter for orthopedic aftercare following surgical amputation: Secondary | ICD-10-CM | POA: Diagnosis not present

## 2021-01-09 DIAGNOSIS — G3281 Cerebellar ataxia in diseases classified elsewhere: Secondary | ICD-10-CM | POA: Diagnosis not present

## 2021-01-09 DIAGNOSIS — R2689 Other abnormalities of gait and mobility: Secondary | ICD-10-CM | POA: Diagnosis not present

## 2021-01-09 DIAGNOSIS — M6281 Muscle weakness (generalized): Secondary | ICD-10-CM | POA: Diagnosis not present

## 2021-01-09 DIAGNOSIS — R1312 Dysphagia, oropharyngeal phase: Secondary | ICD-10-CM | POA: Diagnosis not present

## 2021-01-09 DIAGNOSIS — R262 Difficulty in walking, not elsewhere classified: Secondary | ICD-10-CM | POA: Diagnosis not present

## 2021-01-09 DIAGNOSIS — Z9181 History of falling: Secondary | ICD-10-CM | POA: Diagnosis not present

## 2021-01-09 DIAGNOSIS — Z89619 Acquired absence of unspecified leg above knee: Secondary | ICD-10-CM | POA: Diagnosis not present

## 2021-01-10 DIAGNOSIS — Z9181 History of falling: Secondary | ICD-10-CM | POA: Diagnosis not present

## 2021-01-10 DIAGNOSIS — Z89619 Acquired absence of unspecified leg above knee: Secondary | ICD-10-CM | POA: Diagnosis not present

## 2021-01-10 DIAGNOSIS — G3281 Cerebellar ataxia in diseases classified elsewhere: Secondary | ICD-10-CM | POA: Diagnosis not present

## 2021-01-10 DIAGNOSIS — R2689 Other abnormalities of gait and mobility: Secondary | ICD-10-CM | POA: Diagnosis not present

## 2021-01-10 DIAGNOSIS — M6281 Muscle weakness (generalized): Secondary | ICD-10-CM | POA: Diagnosis not present

## 2021-01-10 DIAGNOSIS — R262 Difficulty in walking, not elsewhere classified: Secondary | ICD-10-CM | POA: Diagnosis not present

## 2021-01-10 DIAGNOSIS — R1312 Dysphagia, oropharyngeal phase: Secondary | ICD-10-CM | POA: Diagnosis not present

## 2021-01-10 DIAGNOSIS — Z4781 Encounter for orthopedic aftercare following surgical amputation: Secondary | ICD-10-CM | POA: Diagnosis not present

## 2021-01-11 DIAGNOSIS — R2689 Other abnormalities of gait and mobility: Secondary | ICD-10-CM | POA: Diagnosis not present

## 2021-01-11 DIAGNOSIS — G3281 Cerebellar ataxia in diseases classified elsewhere: Secondary | ICD-10-CM | POA: Diagnosis not present

## 2021-01-11 DIAGNOSIS — M6281 Muscle weakness (generalized): Secondary | ICD-10-CM | POA: Diagnosis not present

## 2021-01-11 DIAGNOSIS — Z4781 Encounter for orthopedic aftercare following surgical amputation: Secondary | ICD-10-CM | POA: Diagnosis not present

## 2021-01-11 DIAGNOSIS — R262 Difficulty in walking, not elsewhere classified: Secondary | ICD-10-CM | POA: Diagnosis not present

## 2021-01-11 DIAGNOSIS — Z89619 Acquired absence of unspecified leg above knee: Secondary | ICD-10-CM | POA: Diagnosis not present

## 2021-01-11 DIAGNOSIS — R1312 Dysphagia, oropharyngeal phase: Secondary | ICD-10-CM | POA: Diagnosis not present

## 2021-01-11 DIAGNOSIS — Z9181 History of falling: Secondary | ICD-10-CM | POA: Diagnosis not present

## 2021-01-12 DIAGNOSIS — Z9181 History of falling: Secondary | ICD-10-CM | POA: Diagnosis not present

## 2021-01-12 DIAGNOSIS — Z4781 Encounter for orthopedic aftercare following surgical amputation: Secondary | ICD-10-CM | POA: Diagnosis not present

## 2021-01-12 DIAGNOSIS — R1312 Dysphagia, oropharyngeal phase: Secondary | ICD-10-CM | POA: Diagnosis not present

## 2021-01-12 DIAGNOSIS — G3281 Cerebellar ataxia in diseases classified elsewhere: Secondary | ICD-10-CM | POA: Diagnosis not present

## 2021-01-12 DIAGNOSIS — M6281 Muscle weakness (generalized): Secondary | ICD-10-CM | POA: Diagnosis not present

## 2021-01-12 DIAGNOSIS — R2689 Other abnormalities of gait and mobility: Secondary | ICD-10-CM | POA: Diagnosis not present

## 2021-01-12 DIAGNOSIS — Z89619 Acquired absence of unspecified leg above knee: Secondary | ICD-10-CM | POA: Diagnosis not present

## 2021-01-12 DIAGNOSIS — R262 Difficulty in walking, not elsewhere classified: Secondary | ICD-10-CM | POA: Diagnosis not present

## 2021-01-13 DIAGNOSIS — I4891 Unspecified atrial fibrillation: Secondary | ICD-10-CM | POA: Diagnosis not present

## 2021-01-13 DIAGNOSIS — M6281 Muscle weakness (generalized): Secondary | ICD-10-CM | POA: Diagnosis not present

## 2021-01-13 DIAGNOSIS — G3281 Cerebellar ataxia in diseases classified elsewhere: Secondary | ICD-10-CM | POA: Diagnosis not present

## 2021-01-13 DIAGNOSIS — J449 Chronic obstructive pulmonary disease, unspecified: Secondary | ICD-10-CM | POA: Diagnosis not present

## 2021-01-13 DIAGNOSIS — R1312 Dysphagia, oropharyngeal phase: Secondary | ICD-10-CM | POA: Diagnosis not present

## 2021-01-13 DIAGNOSIS — Z89619 Acquired absence of unspecified leg above knee: Secondary | ICD-10-CM | POA: Diagnosis not present

## 2021-01-13 DIAGNOSIS — R2689 Other abnormalities of gait and mobility: Secondary | ICD-10-CM | POA: Diagnosis not present

## 2021-01-13 DIAGNOSIS — Z9181 History of falling: Secondary | ICD-10-CM | POA: Diagnosis not present

## 2021-01-13 DIAGNOSIS — R748 Abnormal levels of other serum enzymes: Secondary | ICD-10-CM | POA: Diagnosis not present

## 2021-01-13 DIAGNOSIS — R262 Difficulty in walking, not elsewhere classified: Secondary | ICD-10-CM | POA: Diagnosis not present

## 2021-01-13 DIAGNOSIS — Z89612 Acquired absence of left leg above knee: Secondary | ICD-10-CM | POA: Diagnosis not present

## 2021-01-13 DIAGNOSIS — E1159 Type 2 diabetes mellitus with other circulatory complications: Secondary | ICD-10-CM | POA: Diagnosis not present

## 2021-01-13 DIAGNOSIS — R296 Repeated falls: Secondary | ICD-10-CM | POA: Diagnosis not present

## 2021-01-13 DIAGNOSIS — I152 Hypertension secondary to endocrine disorders: Secondary | ICD-10-CM | POA: Diagnosis not present

## 2021-01-13 DIAGNOSIS — Z4781 Encounter for orthopedic aftercare following surgical amputation: Secondary | ICD-10-CM | POA: Diagnosis not present

## 2021-01-16 DIAGNOSIS — Z89619 Acquired absence of unspecified leg above knee: Secondary | ICD-10-CM | POA: Diagnosis not present

## 2021-01-16 DIAGNOSIS — Z9181 History of falling: Secondary | ICD-10-CM | POA: Diagnosis not present

## 2021-01-16 DIAGNOSIS — M6281 Muscle weakness (generalized): Secondary | ICD-10-CM | POA: Diagnosis not present

## 2021-01-16 DIAGNOSIS — R262 Difficulty in walking, not elsewhere classified: Secondary | ICD-10-CM | POA: Diagnosis not present

## 2021-01-16 DIAGNOSIS — R2689 Other abnormalities of gait and mobility: Secondary | ICD-10-CM | POA: Diagnosis not present

## 2021-01-16 DIAGNOSIS — R1312 Dysphagia, oropharyngeal phase: Secondary | ICD-10-CM | POA: Diagnosis not present

## 2021-01-16 DIAGNOSIS — Z4781 Encounter for orthopedic aftercare following surgical amputation: Secondary | ICD-10-CM | POA: Diagnosis not present

## 2021-01-16 DIAGNOSIS — G3281 Cerebellar ataxia in diseases classified elsewhere: Secondary | ICD-10-CM | POA: Diagnosis not present

## 2021-01-17 DIAGNOSIS — R1312 Dysphagia, oropharyngeal phase: Secondary | ICD-10-CM | POA: Diagnosis not present

## 2021-01-17 DIAGNOSIS — Z4781 Encounter for orthopedic aftercare following surgical amputation: Secondary | ICD-10-CM | POA: Diagnosis not present

## 2021-01-17 DIAGNOSIS — R2689 Other abnormalities of gait and mobility: Secondary | ICD-10-CM | POA: Diagnosis not present

## 2021-01-17 DIAGNOSIS — Z89619 Acquired absence of unspecified leg above knee: Secondary | ICD-10-CM | POA: Diagnosis not present

## 2021-01-17 DIAGNOSIS — G3281 Cerebellar ataxia in diseases classified elsewhere: Secondary | ICD-10-CM | POA: Diagnosis not present

## 2021-01-17 DIAGNOSIS — R262 Difficulty in walking, not elsewhere classified: Secondary | ICD-10-CM | POA: Diagnosis not present

## 2021-01-17 DIAGNOSIS — Z9181 History of falling: Secondary | ICD-10-CM | POA: Diagnosis not present

## 2021-01-17 DIAGNOSIS — M6281 Muscle weakness (generalized): Secondary | ICD-10-CM | POA: Diagnosis not present

## 2021-01-18 DIAGNOSIS — R1312 Dysphagia, oropharyngeal phase: Secondary | ICD-10-CM | POA: Diagnosis not present

## 2021-01-18 DIAGNOSIS — Z89619 Acquired absence of unspecified leg above knee: Secondary | ICD-10-CM | POA: Diagnosis not present

## 2021-01-18 DIAGNOSIS — L304 Erythema intertrigo: Secondary | ICD-10-CM | POA: Diagnosis not present

## 2021-01-18 DIAGNOSIS — G3281 Cerebellar ataxia in diseases classified elsewhere: Secondary | ICD-10-CM | POA: Diagnosis not present

## 2021-01-18 DIAGNOSIS — R262 Difficulty in walking, not elsewhere classified: Secondary | ICD-10-CM | POA: Diagnosis not present

## 2021-01-18 DIAGNOSIS — R2689 Other abnormalities of gait and mobility: Secondary | ICD-10-CM | POA: Diagnosis not present

## 2021-01-18 DIAGNOSIS — Z4781 Encounter for orthopedic aftercare following surgical amputation: Secondary | ICD-10-CM | POA: Diagnosis not present

## 2021-01-18 DIAGNOSIS — M6281 Muscle weakness (generalized): Secondary | ICD-10-CM | POA: Diagnosis not present

## 2021-01-18 DIAGNOSIS — Z9181 History of falling: Secondary | ICD-10-CM | POA: Diagnosis not present

## 2021-01-19 DIAGNOSIS — R1312 Dysphagia, oropharyngeal phase: Secondary | ICD-10-CM | POA: Diagnosis not present

## 2021-01-19 DIAGNOSIS — Z4781 Encounter for orthopedic aftercare following surgical amputation: Secondary | ICD-10-CM | POA: Diagnosis not present

## 2021-01-19 DIAGNOSIS — R2689 Other abnormalities of gait and mobility: Secondary | ICD-10-CM | POA: Diagnosis not present

## 2021-01-19 DIAGNOSIS — M6281 Muscle weakness (generalized): Secondary | ICD-10-CM | POA: Diagnosis not present

## 2021-01-19 DIAGNOSIS — G3281 Cerebellar ataxia in diseases classified elsewhere: Secondary | ICD-10-CM | POA: Diagnosis not present

## 2021-01-19 DIAGNOSIS — Z9181 History of falling: Secondary | ICD-10-CM | POA: Diagnosis not present

## 2021-01-19 DIAGNOSIS — Z89619 Acquired absence of unspecified leg above knee: Secondary | ICD-10-CM | POA: Diagnosis not present

## 2021-01-19 DIAGNOSIS — R262 Difficulty in walking, not elsewhere classified: Secondary | ICD-10-CM | POA: Diagnosis not present

## 2021-01-21 DIAGNOSIS — Z4781 Encounter for orthopedic aftercare following surgical amputation: Secondary | ICD-10-CM | POA: Diagnosis not present

## 2021-01-21 DIAGNOSIS — R262 Difficulty in walking, not elsewhere classified: Secondary | ICD-10-CM | POA: Diagnosis not present

## 2021-01-21 DIAGNOSIS — R2689 Other abnormalities of gait and mobility: Secondary | ICD-10-CM | POA: Diagnosis not present

## 2021-01-21 DIAGNOSIS — Z9181 History of falling: Secondary | ICD-10-CM | POA: Diagnosis not present

## 2021-01-21 DIAGNOSIS — M6281 Muscle weakness (generalized): Secondary | ICD-10-CM | POA: Diagnosis not present

## 2021-01-21 DIAGNOSIS — Z89619 Acquired absence of unspecified leg above knee: Secondary | ICD-10-CM | POA: Diagnosis not present

## 2021-01-21 DIAGNOSIS — R1312 Dysphagia, oropharyngeal phase: Secondary | ICD-10-CM | POA: Diagnosis not present

## 2021-01-21 DIAGNOSIS — G3281 Cerebellar ataxia in diseases classified elsewhere: Secondary | ICD-10-CM | POA: Diagnosis not present

## 2021-01-23 DIAGNOSIS — Z89619 Acquired absence of unspecified leg above knee: Secondary | ICD-10-CM | POA: Diagnosis not present

## 2021-01-23 DIAGNOSIS — R262 Difficulty in walking, not elsewhere classified: Secondary | ICD-10-CM | POA: Diagnosis not present

## 2021-01-23 DIAGNOSIS — Z4781 Encounter for orthopedic aftercare following surgical amputation: Secondary | ICD-10-CM | POA: Diagnosis not present

## 2021-01-23 DIAGNOSIS — M6281 Muscle weakness (generalized): Secondary | ICD-10-CM | POA: Diagnosis not present

## 2021-01-23 DIAGNOSIS — G3281 Cerebellar ataxia in diseases classified elsewhere: Secondary | ICD-10-CM | POA: Diagnosis not present

## 2021-01-23 DIAGNOSIS — R2689 Other abnormalities of gait and mobility: Secondary | ICD-10-CM | POA: Diagnosis not present

## 2021-01-23 DIAGNOSIS — Z9181 History of falling: Secondary | ICD-10-CM | POA: Diagnosis not present

## 2021-01-23 DIAGNOSIS — R1312 Dysphagia, oropharyngeal phase: Secondary | ICD-10-CM | POA: Diagnosis not present

## 2021-01-24 DIAGNOSIS — Z4781 Encounter for orthopedic aftercare following surgical amputation: Secondary | ICD-10-CM | POA: Diagnosis not present

## 2021-01-24 DIAGNOSIS — G3281 Cerebellar ataxia in diseases classified elsewhere: Secondary | ICD-10-CM | POA: Diagnosis not present

## 2021-01-24 DIAGNOSIS — Z89619 Acquired absence of unspecified leg above knee: Secondary | ICD-10-CM | POA: Diagnosis not present

## 2021-01-24 DIAGNOSIS — R262 Difficulty in walking, not elsewhere classified: Secondary | ICD-10-CM | POA: Diagnosis not present

## 2021-01-24 DIAGNOSIS — Z9181 History of falling: Secondary | ICD-10-CM | POA: Diagnosis not present

## 2021-01-24 DIAGNOSIS — M6281 Muscle weakness (generalized): Secondary | ICD-10-CM | POA: Diagnosis not present

## 2021-01-24 DIAGNOSIS — R2689 Other abnormalities of gait and mobility: Secondary | ICD-10-CM | POA: Diagnosis not present

## 2021-01-24 DIAGNOSIS — R1312 Dysphagia, oropharyngeal phase: Secondary | ICD-10-CM | POA: Diagnosis not present

## 2021-01-25 DIAGNOSIS — R2689 Other abnormalities of gait and mobility: Secondary | ICD-10-CM | POA: Diagnosis not present

## 2021-01-25 DIAGNOSIS — R262 Difficulty in walking, not elsewhere classified: Secondary | ICD-10-CM | POA: Diagnosis not present

## 2021-01-25 DIAGNOSIS — Z9181 History of falling: Secondary | ICD-10-CM | POA: Diagnosis not present

## 2021-01-25 DIAGNOSIS — Z4781 Encounter for orthopedic aftercare following surgical amputation: Secondary | ICD-10-CM | POA: Diagnosis not present

## 2021-01-25 DIAGNOSIS — M6281 Muscle weakness (generalized): Secondary | ICD-10-CM | POA: Diagnosis not present

## 2021-01-25 DIAGNOSIS — G3281 Cerebellar ataxia in diseases classified elsewhere: Secondary | ICD-10-CM | POA: Diagnosis not present

## 2021-01-25 DIAGNOSIS — Z89619 Acquired absence of unspecified leg above knee: Secondary | ICD-10-CM | POA: Diagnosis not present

## 2021-01-25 DIAGNOSIS — R1312 Dysphagia, oropharyngeal phase: Secondary | ICD-10-CM | POA: Diagnosis not present

## 2021-01-26 DIAGNOSIS — R1312 Dysphagia, oropharyngeal phase: Secondary | ICD-10-CM | POA: Diagnosis not present

## 2021-01-26 DIAGNOSIS — G3281 Cerebellar ataxia in diseases classified elsewhere: Secondary | ICD-10-CM | POA: Diagnosis not present

## 2021-01-26 DIAGNOSIS — Z4781 Encounter for orthopedic aftercare following surgical amputation: Secondary | ICD-10-CM | POA: Diagnosis not present

## 2021-01-26 DIAGNOSIS — R2689 Other abnormalities of gait and mobility: Secondary | ICD-10-CM | POA: Diagnosis not present

## 2021-01-26 DIAGNOSIS — M6281 Muscle weakness (generalized): Secondary | ICD-10-CM | POA: Diagnosis not present

## 2021-01-26 DIAGNOSIS — Z9181 History of falling: Secondary | ICD-10-CM | POA: Diagnosis not present

## 2021-01-26 DIAGNOSIS — Z89619 Acquired absence of unspecified leg above knee: Secondary | ICD-10-CM | POA: Diagnosis not present

## 2021-01-26 DIAGNOSIS — R262 Difficulty in walking, not elsewhere classified: Secondary | ICD-10-CM | POA: Diagnosis not present

## 2021-01-27 DIAGNOSIS — Z89619 Acquired absence of unspecified leg above knee: Secondary | ICD-10-CM | POA: Diagnosis not present

## 2021-01-27 DIAGNOSIS — Z9181 History of falling: Secondary | ICD-10-CM | POA: Diagnosis not present

## 2021-01-27 DIAGNOSIS — G3281 Cerebellar ataxia in diseases classified elsewhere: Secondary | ICD-10-CM | POA: Diagnosis not present

## 2021-01-27 DIAGNOSIS — M6281 Muscle weakness (generalized): Secondary | ICD-10-CM | POA: Diagnosis not present

## 2021-01-27 DIAGNOSIS — R262 Difficulty in walking, not elsewhere classified: Secondary | ICD-10-CM | POA: Diagnosis not present

## 2021-01-27 DIAGNOSIS — Z4781 Encounter for orthopedic aftercare following surgical amputation: Secondary | ICD-10-CM | POA: Diagnosis not present

## 2021-01-27 DIAGNOSIS — R2689 Other abnormalities of gait and mobility: Secondary | ICD-10-CM | POA: Diagnosis not present

## 2021-01-27 DIAGNOSIS — R1312 Dysphagia, oropharyngeal phase: Secondary | ICD-10-CM | POA: Diagnosis not present

## 2021-01-28 DIAGNOSIS — Z89619 Acquired absence of unspecified leg above knee: Secondary | ICD-10-CM | POA: Diagnosis not present

## 2021-01-28 DIAGNOSIS — Z4781 Encounter for orthopedic aftercare following surgical amputation: Secondary | ICD-10-CM | POA: Diagnosis not present

## 2021-01-28 DIAGNOSIS — M6281 Muscle weakness (generalized): Secondary | ICD-10-CM | POA: Diagnosis not present

## 2021-01-28 DIAGNOSIS — R262 Difficulty in walking, not elsewhere classified: Secondary | ICD-10-CM | POA: Diagnosis not present

## 2021-01-28 DIAGNOSIS — G3281 Cerebellar ataxia in diseases classified elsewhere: Secondary | ICD-10-CM | POA: Diagnosis not present

## 2021-01-28 DIAGNOSIS — Z9181 History of falling: Secondary | ICD-10-CM | POA: Diagnosis not present

## 2021-01-28 DIAGNOSIS — R2689 Other abnormalities of gait and mobility: Secondary | ICD-10-CM | POA: Diagnosis not present

## 2021-01-28 DIAGNOSIS — R1312 Dysphagia, oropharyngeal phase: Secondary | ICD-10-CM | POA: Diagnosis not present

## 2021-01-30 DIAGNOSIS — R2689 Other abnormalities of gait and mobility: Secondary | ICD-10-CM | POA: Diagnosis not present

## 2021-01-30 DIAGNOSIS — Z89619 Acquired absence of unspecified leg above knee: Secondary | ICD-10-CM | POA: Diagnosis not present

## 2021-01-30 DIAGNOSIS — Z9181 History of falling: Secondary | ICD-10-CM | POA: Diagnosis not present

## 2021-01-30 DIAGNOSIS — R262 Difficulty in walking, not elsewhere classified: Secondary | ICD-10-CM | POA: Diagnosis not present

## 2021-01-30 DIAGNOSIS — Z4781 Encounter for orthopedic aftercare following surgical amputation: Secondary | ICD-10-CM | POA: Diagnosis not present

## 2021-01-30 DIAGNOSIS — G3281 Cerebellar ataxia in diseases classified elsewhere: Secondary | ICD-10-CM | POA: Diagnosis not present

## 2021-01-30 DIAGNOSIS — R1312 Dysphagia, oropharyngeal phase: Secondary | ICD-10-CM | POA: Diagnosis not present

## 2021-01-30 DIAGNOSIS — M6281 Muscle weakness (generalized): Secondary | ICD-10-CM | POA: Diagnosis not present

## 2021-01-31 DIAGNOSIS — Z4781 Encounter for orthopedic aftercare following surgical amputation: Secondary | ICD-10-CM | POA: Diagnosis not present

## 2021-01-31 DIAGNOSIS — R262 Difficulty in walking, not elsewhere classified: Secondary | ICD-10-CM | POA: Diagnosis not present

## 2021-01-31 DIAGNOSIS — M6281 Muscle weakness (generalized): Secondary | ICD-10-CM | POA: Diagnosis not present

## 2021-01-31 DIAGNOSIS — R2689 Other abnormalities of gait and mobility: Secondary | ICD-10-CM | POA: Diagnosis not present

## 2021-01-31 DIAGNOSIS — Z9181 History of falling: Secondary | ICD-10-CM | POA: Diagnosis not present

## 2021-01-31 DIAGNOSIS — R1312 Dysphagia, oropharyngeal phase: Secondary | ICD-10-CM | POA: Diagnosis not present

## 2021-01-31 DIAGNOSIS — Z89619 Acquired absence of unspecified leg above knee: Secondary | ICD-10-CM | POA: Diagnosis not present

## 2021-01-31 DIAGNOSIS — G3281 Cerebellar ataxia in diseases classified elsewhere: Secondary | ICD-10-CM | POA: Diagnosis not present

## 2021-02-01 DIAGNOSIS — Z9181 History of falling: Secondary | ICD-10-CM | POA: Diagnosis not present

## 2021-02-01 DIAGNOSIS — R262 Difficulty in walking, not elsewhere classified: Secondary | ICD-10-CM | POA: Diagnosis not present

## 2021-02-01 DIAGNOSIS — Z4781 Encounter for orthopedic aftercare following surgical amputation: Secondary | ICD-10-CM | POA: Diagnosis not present

## 2021-02-01 DIAGNOSIS — R2689 Other abnormalities of gait and mobility: Secondary | ICD-10-CM | POA: Diagnosis not present

## 2021-02-01 DIAGNOSIS — G3281 Cerebellar ataxia in diseases classified elsewhere: Secondary | ICD-10-CM | POA: Diagnosis not present

## 2021-02-01 DIAGNOSIS — R1312 Dysphagia, oropharyngeal phase: Secondary | ICD-10-CM | POA: Diagnosis not present

## 2021-02-01 DIAGNOSIS — M6281 Muscle weakness (generalized): Secondary | ICD-10-CM | POA: Diagnosis not present

## 2021-02-01 DIAGNOSIS — Z89619 Acquired absence of unspecified leg above knee: Secondary | ICD-10-CM | POA: Diagnosis not present

## 2021-02-02 DIAGNOSIS — Z4781 Encounter for orthopedic aftercare following surgical amputation: Secondary | ICD-10-CM | POA: Diagnosis not present

## 2021-02-02 DIAGNOSIS — Z89619 Acquired absence of unspecified leg above knee: Secondary | ICD-10-CM | POA: Diagnosis not present

## 2021-02-02 DIAGNOSIS — G3281 Cerebellar ataxia in diseases classified elsewhere: Secondary | ICD-10-CM | POA: Diagnosis not present

## 2021-02-02 DIAGNOSIS — Z9181 History of falling: Secondary | ICD-10-CM | POA: Diagnosis not present

## 2021-02-02 DIAGNOSIS — M6281 Muscle weakness (generalized): Secondary | ICD-10-CM | POA: Diagnosis not present

## 2021-02-02 DIAGNOSIS — R1312 Dysphagia, oropharyngeal phase: Secondary | ICD-10-CM | POA: Diagnosis not present

## 2021-02-02 DIAGNOSIS — R262 Difficulty in walking, not elsewhere classified: Secondary | ICD-10-CM | POA: Diagnosis not present

## 2021-02-02 DIAGNOSIS — R2689 Other abnormalities of gait and mobility: Secondary | ICD-10-CM | POA: Diagnosis not present

## 2021-02-04 DIAGNOSIS — R1312 Dysphagia, oropharyngeal phase: Secondary | ICD-10-CM | POA: Diagnosis not present

## 2021-02-04 DIAGNOSIS — Z4781 Encounter for orthopedic aftercare following surgical amputation: Secondary | ICD-10-CM | POA: Diagnosis not present

## 2021-02-04 DIAGNOSIS — G3281 Cerebellar ataxia in diseases classified elsewhere: Secondary | ICD-10-CM | POA: Diagnosis not present

## 2021-02-04 DIAGNOSIS — M6281 Muscle weakness (generalized): Secondary | ICD-10-CM | POA: Diagnosis not present

## 2021-02-04 DIAGNOSIS — R2689 Other abnormalities of gait and mobility: Secondary | ICD-10-CM | POA: Diagnosis not present

## 2021-02-04 DIAGNOSIS — Z9181 History of falling: Secondary | ICD-10-CM | POA: Diagnosis not present

## 2021-02-04 DIAGNOSIS — R262 Difficulty in walking, not elsewhere classified: Secondary | ICD-10-CM | POA: Diagnosis not present

## 2021-02-04 DIAGNOSIS — Z89619 Acquired absence of unspecified leg above knee: Secondary | ICD-10-CM | POA: Diagnosis not present

## 2021-02-05 DIAGNOSIS — Z9181 History of falling: Secondary | ICD-10-CM | POA: Diagnosis not present

## 2021-02-05 DIAGNOSIS — Z4781 Encounter for orthopedic aftercare following surgical amputation: Secondary | ICD-10-CM | POA: Diagnosis not present

## 2021-02-05 DIAGNOSIS — R2689 Other abnormalities of gait and mobility: Secondary | ICD-10-CM | POA: Diagnosis not present

## 2021-02-05 DIAGNOSIS — M6281 Muscle weakness (generalized): Secondary | ICD-10-CM | POA: Diagnosis not present

## 2021-02-05 DIAGNOSIS — G3281 Cerebellar ataxia in diseases classified elsewhere: Secondary | ICD-10-CM | POA: Diagnosis not present

## 2021-02-05 DIAGNOSIS — R262 Difficulty in walking, not elsewhere classified: Secondary | ICD-10-CM | POA: Diagnosis not present

## 2021-02-05 DIAGNOSIS — Z89619 Acquired absence of unspecified leg above knee: Secondary | ICD-10-CM | POA: Diagnosis not present

## 2021-02-05 DIAGNOSIS — R1312 Dysphagia, oropharyngeal phase: Secondary | ICD-10-CM | POA: Diagnosis not present

## 2021-02-06 DIAGNOSIS — Z9181 History of falling: Secondary | ICD-10-CM | POA: Diagnosis not present

## 2021-02-06 DIAGNOSIS — Z89619 Acquired absence of unspecified leg above knee: Secondary | ICD-10-CM | POA: Diagnosis not present

## 2021-02-06 DIAGNOSIS — R1312 Dysphagia, oropharyngeal phase: Secondary | ICD-10-CM | POA: Diagnosis not present

## 2021-02-06 DIAGNOSIS — Z4781 Encounter for orthopedic aftercare following surgical amputation: Secondary | ICD-10-CM | POA: Diagnosis not present

## 2021-02-06 DIAGNOSIS — R262 Difficulty in walking, not elsewhere classified: Secondary | ICD-10-CM | POA: Diagnosis not present

## 2021-02-06 DIAGNOSIS — M6281 Muscle weakness (generalized): Secondary | ICD-10-CM | POA: Diagnosis not present

## 2021-02-06 DIAGNOSIS — R2689 Other abnormalities of gait and mobility: Secondary | ICD-10-CM | POA: Diagnosis not present

## 2021-02-06 DIAGNOSIS — G3281 Cerebellar ataxia in diseases classified elsewhere: Secondary | ICD-10-CM | POA: Diagnosis not present

## 2021-02-07 DIAGNOSIS — Z9181 History of falling: Secondary | ICD-10-CM | POA: Diagnosis not present

## 2021-02-07 DIAGNOSIS — Z89619 Acquired absence of unspecified leg above knee: Secondary | ICD-10-CM | POA: Diagnosis not present

## 2021-02-07 DIAGNOSIS — U071 COVID-19: Secondary | ICD-10-CM | POA: Diagnosis not present

## 2021-02-07 DIAGNOSIS — Z4781 Encounter for orthopedic aftercare following surgical amputation: Secondary | ICD-10-CM | POA: Diagnosis not present

## 2021-02-07 DIAGNOSIS — I152 Hypertension secondary to endocrine disorders: Secondary | ICD-10-CM | POA: Diagnosis not present

## 2021-02-07 DIAGNOSIS — J449 Chronic obstructive pulmonary disease, unspecified: Secondary | ICD-10-CM | POA: Diagnosis not present

## 2021-02-07 DIAGNOSIS — E1159 Type 2 diabetes mellitus with other circulatory complications: Secondary | ICD-10-CM | POA: Diagnosis not present

## 2021-02-07 DIAGNOSIS — R262 Difficulty in walking, not elsewhere classified: Secondary | ICD-10-CM | POA: Diagnosis not present

## 2021-02-07 DIAGNOSIS — R2689 Other abnormalities of gait and mobility: Secondary | ICD-10-CM | POA: Diagnosis not present

## 2021-02-07 DIAGNOSIS — G3281 Cerebellar ataxia in diseases classified elsewhere: Secondary | ICD-10-CM | POA: Diagnosis not present

## 2021-02-07 DIAGNOSIS — R1312 Dysphagia, oropharyngeal phase: Secondary | ICD-10-CM | POA: Diagnosis not present

## 2021-02-07 DIAGNOSIS — I4891 Unspecified atrial fibrillation: Secondary | ICD-10-CM | POA: Diagnosis not present

## 2021-02-07 DIAGNOSIS — I739 Peripheral vascular disease, unspecified: Secondary | ICD-10-CM | POA: Diagnosis not present

## 2021-02-07 DIAGNOSIS — M6281 Muscle weakness (generalized): Secondary | ICD-10-CM | POA: Diagnosis not present

## 2021-02-12 ENCOUNTER — Encounter (HOSPITAL_COMMUNITY): Payer: Self-pay | Admitting: Internal Medicine

## 2021-02-12 ENCOUNTER — Emergency Department (HOSPITAL_COMMUNITY): Payer: Medicare Other

## 2021-02-12 ENCOUNTER — Inpatient Hospital Stay (HOSPITAL_COMMUNITY)
Admission: EM | Admit: 2021-02-12 | Discharge: 2021-03-03 | DRG: 177 | Disposition: A | Discharge status: Skilled Nursing Facility | Payer: Medicare Other | Source: Skilled Nursing Facility | Attending: Internal Medicine | Admitting: Internal Medicine

## 2021-02-12 ENCOUNTER — Other Ambulatory Visit: Payer: Self-pay

## 2021-02-12 DIAGNOSIS — J410 Simple chronic bronchitis: Secondary | ICD-10-CM | POA: Diagnosis not present

## 2021-02-12 DIAGNOSIS — F319 Bipolar disorder, unspecified: Secondary | ICD-10-CM | POA: Diagnosis present

## 2021-02-12 DIAGNOSIS — J84112 Idiopathic pulmonary fibrosis: Secondary | ICD-10-CM | POA: Diagnosis present

## 2021-02-12 DIAGNOSIS — E785 Hyperlipidemia, unspecified: Secondary | ICD-10-CM | POA: Diagnosis present

## 2021-02-12 DIAGNOSIS — I82431 Acute embolism and thrombosis of right popliteal vein: Secondary | ICD-10-CM | POA: Diagnosis not present

## 2021-02-12 DIAGNOSIS — R918 Other nonspecific abnormal finding of lung field: Secondary | ICD-10-CM | POA: Diagnosis not present

## 2021-02-12 DIAGNOSIS — Z7982 Long term (current) use of aspirin: Secondary | ICD-10-CM

## 2021-02-12 DIAGNOSIS — R296 Repeated falls: Secondary | ICD-10-CM | POA: Diagnosis present

## 2021-02-12 DIAGNOSIS — J9811 Atelectasis: Secondary | ICD-10-CM | POA: Diagnosis not present

## 2021-02-12 DIAGNOSIS — I10 Essential (primary) hypertension: Secondary | ICD-10-CM

## 2021-02-12 DIAGNOSIS — R609 Edema, unspecified: Secondary | ICD-10-CM | POA: Diagnosis not present

## 2021-02-12 DIAGNOSIS — I82441 Acute embolism and thrombosis of right tibial vein: Secondary | ICD-10-CM | POA: Diagnosis not present

## 2021-02-12 DIAGNOSIS — E119 Type 2 diabetes mellitus without complications: Secondary | ICD-10-CM

## 2021-02-12 DIAGNOSIS — Z86718 Personal history of other venous thrombosis and embolism: Secondary | ICD-10-CM | POA: Diagnosis not present

## 2021-02-12 DIAGNOSIS — Z8249 Family history of ischemic heart disease and other diseases of the circulatory system: Secondary | ICD-10-CM

## 2021-02-12 DIAGNOSIS — E1151 Type 2 diabetes mellitus with diabetic peripheral angiopathy without gangrene: Secondary | ICD-10-CM | POA: Diagnosis present

## 2021-02-12 DIAGNOSIS — Z7984 Long term (current) use of oral hypoglycemic drugs: Secondary | ICD-10-CM

## 2021-02-12 DIAGNOSIS — I4892 Unspecified atrial flutter: Secondary | ICD-10-CM | POA: Diagnosis present

## 2021-02-12 DIAGNOSIS — I5032 Chronic diastolic (congestive) heart failure: Secondary | ICD-10-CM | POA: Diagnosis present

## 2021-02-12 DIAGNOSIS — I70201 Unspecified atherosclerosis of native arteries of extremities, right leg: Secondary | ICD-10-CM | POA: Diagnosis present

## 2021-02-12 DIAGNOSIS — F17210 Nicotine dependence, cigarettes, uncomplicated: Secondary | ICD-10-CM | POA: Diagnosis present

## 2021-02-12 DIAGNOSIS — S2243XA Multiple fractures of ribs, bilateral, initial encounter for closed fracture: Secondary | ICD-10-CM | POA: Diagnosis present

## 2021-02-12 DIAGNOSIS — U071 COVID-19: Secondary | ICD-10-CM | POA: Diagnosis present

## 2021-02-12 DIAGNOSIS — L89312 Pressure ulcer of right buttock, stage 2: Secondary | ICD-10-CM | POA: Diagnosis present

## 2021-02-12 DIAGNOSIS — J439 Emphysema, unspecified: Secondary | ICD-10-CM | POA: Diagnosis not present

## 2021-02-12 DIAGNOSIS — J42 Unspecified chronic bronchitis: Secondary | ICD-10-CM | POA: Diagnosis not present

## 2021-02-12 DIAGNOSIS — Z89612 Acquired absence of left leg above knee: Secondary | ICD-10-CM

## 2021-02-12 DIAGNOSIS — E1100 Type 2 diabetes mellitus with hyperosmolarity without nonketotic hyperglycemic-hyperosmolar coma (NKHHC): Secondary | ICD-10-CM | POA: Diagnosis not present

## 2021-02-12 DIAGNOSIS — L8990 Pressure ulcer of unspecified site, unspecified stage: Secondary | ICD-10-CM | POA: Insufficient documentation

## 2021-02-12 DIAGNOSIS — I959 Hypotension, unspecified: Secondary | ICD-10-CM | POA: Diagnosis not present

## 2021-02-12 DIAGNOSIS — Z96641 Presence of right artificial hip joint: Secondary | ICD-10-CM | POA: Diagnosis present

## 2021-02-12 DIAGNOSIS — I110 Hypertensive heart disease with heart failure: Secondary | ICD-10-CM | POA: Diagnosis present

## 2021-02-12 DIAGNOSIS — Z955 Presence of coronary angioplasty implant and graft: Secondary | ICD-10-CM

## 2021-02-12 DIAGNOSIS — R001 Bradycardia, unspecified: Secondary | ICD-10-CM | POA: Diagnosis not present

## 2021-02-12 DIAGNOSIS — F419 Anxiety disorder, unspecified: Secondary | ICD-10-CM | POA: Diagnosis present

## 2021-02-12 DIAGNOSIS — M1990 Unspecified osteoarthritis, unspecified site: Secondary | ICD-10-CM | POA: Diagnosis present

## 2021-02-12 DIAGNOSIS — Z794 Long term (current) use of insulin: Secondary | ICD-10-CM | POA: Diagnosis not present

## 2021-02-12 DIAGNOSIS — E46 Unspecified protein-calorie malnutrition: Secondary | ICD-10-CM | POA: Diagnosis not present

## 2021-02-12 DIAGNOSIS — Z880 Allergy status to penicillin: Secondary | ICD-10-CM

## 2021-02-12 DIAGNOSIS — J9601 Acute respiratory failure with hypoxia: Secondary | ICD-10-CM | POA: Diagnosis present

## 2021-02-12 DIAGNOSIS — E8881 Metabolic syndrome: Secondary | ICD-10-CM | POA: Diagnosis present

## 2021-02-12 DIAGNOSIS — J449 Chronic obstructive pulmonary disease, unspecified: Secondary | ICD-10-CM | POA: Diagnosis present

## 2021-02-12 DIAGNOSIS — J438 Other emphysema: Secondary | ICD-10-CM | POA: Diagnosis present

## 2021-02-12 DIAGNOSIS — L89322 Pressure ulcer of left buttock, stage 2: Secondary | ICD-10-CM | POA: Diagnosis present

## 2021-02-12 DIAGNOSIS — S2249XA Multiple fractures of ribs, unspecified side, initial encounter for closed fracture: Secondary | ICD-10-CM

## 2021-02-12 DIAGNOSIS — T380X5A Adverse effect of glucocorticoids and synthetic analogues, initial encounter: Secondary | ICD-10-CM | POA: Diagnosis not present

## 2021-02-12 DIAGNOSIS — I82411 Acute embolism and thrombosis of right femoral vein: Secondary | ICD-10-CM | POA: Diagnosis not present

## 2021-02-12 DIAGNOSIS — I2510 Atherosclerotic heart disease of native coronary artery without angina pectoris: Secondary | ICD-10-CM | POA: Diagnosis present

## 2021-02-12 DIAGNOSIS — I480 Paroxysmal atrial fibrillation: Secondary | ICD-10-CM | POA: Diagnosis present

## 2021-02-12 DIAGNOSIS — F4321 Adjustment disorder with depressed mood: Secondary | ICD-10-CM | POA: Diagnosis present

## 2021-02-12 DIAGNOSIS — E860 Dehydration: Secondary | ICD-10-CM | POA: Diagnosis present

## 2021-02-12 DIAGNOSIS — E876 Hypokalemia: Secondary | ICD-10-CM | POA: Diagnosis not present

## 2021-02-12 DIAGNOSIS — W182XXA Fall in (into) shower or empty bathtub, initial encounter: Secondary | ICD-10-CM | POA: Diagnosis present

## 2021-02-12 DIAGNOSIS — R0902 Hypoxemia: Secondary | ICD-10-CM

## 2021-02-12 DIAGNOSIS — J9611 Chronic respiratory failure with hypoxia: Secondary | ICD-10-CM | POA: Diagnosis present

## 2021-02-12 DIAGNOSIS — Z7901 Long term (current) use of anticoagulants: Secondary | ICD-10-CM

## 2021-02-12 DIAGNOSIS — Z888 Allergy status to other drugs, medicaments and biological substances status: Secondary | ICD-10-CM

## 2021-02-12 DIAGNOSIS — Z6828 Body mass index (BMI) 28.0-28.9, adult: Secondary | ICD-10-CM

## 2021-02-12 DIAGNOSIS — E1169 Type 2 diabetes mellitus with other specified complication: Secondary | ICD-10-CM | POA: Diagnosis present

## 2021-02-12 DIAGNOSIS — R040 Epistaxis: Secondary | ICD-10-CM | POA: Diagnosis not present

## 2021-02-12 DIAGNOSIS — I700 Atherosclerosis of aorta: Secondary | ICD-10-CM | POA: Diagnosis present

## 2021-02-12 DIAGNOSIS — J418 Mixed simple and mucopurulent chronic bronchitis: Secondary | ICD-10-CM | POA: Diagnosis not present

## 2021-02-12 DIAGNOSIS — Z79899 Other long term (current) drug therapy: Secondary | ICD-10-CM

## 2021-02-12 DIAGNOSIS — I252 Old myocardial infarction: Secondary | ICD-10-CM

## 2021-02-12 DIAGNOSIS — J441 Chronic obstructive pulmonary disease with (acute) exacerbation: Secondary | ICD-10-CM | POA: Diagnosis present

## 2021-02-12 DIAGNOSIS — Z9181 History of falling: Secondary | ICD-10-CM

## 2021-02-12 DIAGNOSIS — Z818 Family history of other mental and behavioral disorders: Secondary | ICD-10-CM

## 2021-02-12 HISTORY — DX: Unspecified atrial fibrillation: I48.91

## 2021-02-12 HISTORY — DX: Unspecified atrial flutter: I48.92

## 2021-02-12 HISTORY — DX: Peripheral vascular disease, unspecified: I73.9

## 2021-02-12 HISTORY — DX: Acquired absence of left leg above knee: Z89.612

## 2021-02-12 HISTORY — DX: Type 2 diabetes mellitus without complications: E11.9

## 2021-02-12 HISTORY — DX: Other psychoactive substance abuse, uncomplicated: F19.10

## 2021-02-12 LAB — COMPREHENSIVE METABOLIC PANEL
ALT: 20 U/L (ref 0–44)
AST: 21 U/L (ref 15–41)
Albumin: 3.2 g/dL — ABNORMAL LOW (ref 3.5–5.0)
Alkaline Phosphatase: 50 U/L (ref 38–126)
Anion gap: 9 (ref 5–15)
BUN: 13 mg/dL (ref 8–23)
CO2: 26 mmol/L (ref 22–32)
Calcium: 8.4 mg/dL — ABNORMAL LOW (ref 8.9–10.3)
Chloride: 104 mmol/L (ref 98–111)
Creatinine, Ser: 0.76 mg/dL (ref 0.61–1.24)
GFR, Estimated: >60 mL/min (ref >60)
Glucose, Bld: 194 mg/dL — ABNORMAL HIGH (ref 70–99)
Potassium: 3.4 mmol/L — ABNORMAL LOW (ref 3.5–5.1)
Sodium: 139 mmol/L (ref 135–145)
Total Bilirubin: 0.3 mg/dL (ref 0.3–1.2)
Total Protein: 6.9 g/dL (ref 6.5–8.1)

## 2021-02-12 LAB — BLOOD GAS, ARTERIAL
Acid-Base Excess: 0.2 mmol/L (ref 0.0–2.0)
Bicarbonate: 26 mmol/L (ref 20.0–28.0)
Drawn by: 29503
FIO2: 100
O2 Saturation: 96.8 %
Patient temperature: 98.6
pCO2 arterial: 48.4 mmHg — ABNORMAL HIGH (ref 32.0–48.0)
pH, Arterial: 7.35 (ref 7.350–7.450)
pO2, Arterial: 98.8 mmHg (ref 83.0–108.0)

## 2021-02-12 LAB — GLUCOSE, CAPILLARY
Glucose-Capillary: 212 mg/dL — ABNORMAL HIGH (ref 70–99)
Glucose-Capillary: 317 mg/dL — ABNORMAL HIGH (ref 70–99)

## 2021-02-12 LAB — CBC WITH DIFFERENTIAL/PLATELET
Abs Immature Granulocytes: 0.11 10*3/uL — ABNORMAL HIGH (ref 0.00–0.07)
Basophils Absolute: 0.1 10*3/uL (ref 0.0–0.1)
Basophils Relative: 1 %
Eosinophils Absolute: 0 10*3/uL (ref 0.0–0.5)
Eosinophils Relative: 1 %
HCT: 48.9 % (ref 39.0–52.0)
Hemoglobin: 16.1 g/dL (ref 13.0–17.0)
Immature Granulocytes: 2 %
Lymphocytes Relative: 21 %
Lymphs Abs: 1.4 10*3/uL (ref 0.7–4.0)
MCH: 29.4 pg (ref 26.0–34.0)
MCHC: 32.9 g/dL (ref 30.0–36.0)
MCV: 89.4 fL (ref 80.0–100.0)
Monocytes Absolute: 0.9 10*3/uL (ref 0.1–1.0)
Monocytes Relative: 13 %
Neutro Abs: 4.2 10*3/uL (ref 1.7–7.7)
Neutrophils Relative %: 62 %
Platelets: 231 10*3/uL (ref 150–400)
RBC: 5.47 MIL/uL (ref 4.22–5.81)
RDW: 12.7 % (ref 11.5–15.5)
WBC: 6.7 10*3/uL (ref 4.0–10.5)
nRBC: 0 % (ref 0.0–0.2)

## 2021-02-12 LAB — TROPONIN I (HIGH SENSITIVITY)
Troponin I (High Sensitivity): 12 ng/L (ref <18)
Troponin I (High Sensitivity): 10 ng/L (ref <18)

## 2021-02-12 LAB — RESP PANEL BY RT-PCR (FLU A&B, COVID) ARPGX2
Influenza A by PCR: NEGATIVE
Influenza B by PCR: NEGATIVE
SARS Coronavirus 2 by RT PCR: POSITIVE — AB

## 2021-02-12 LAB — MRSA NEXT GEN BY PCR, NASAL: MRSA by PCR Next Gen: NOT DETECTED

## 2021-02-12 LAB — PROCALCITONIN: Procalcitonin: <0.1 ng/mL

## 2021-02-12 LAB — HEMOGLOBIN A1C
Hgb A1c MFr Bld: 7.7 % — ABNORMAL HIGH (ref 4.8–5.6)
Mean Plasma Glucose: 174.29 mg/dL

## 2021-02-12 MED ORDER — ACETAMINOPHEN 650 MG RE SUPP: 650.0000 mg | Freq: Four times a day (QID) | RECTAL | Status: DC | PRN

## 2021-02-12 MED ORDER — IOHEXOL 350 MG/ML SOLN
75.0000 mL | Freq: Once | INTRAVENOUS | Status: AC | PRN
  Administered 2021-02-12: 75 mL via INTRAVENOUS

## 2021-02-12 MED ORDER — GUAIFENESIN-DM 100-10 MG/5ML PO SYRP
10.0000 mL | ORAL_SOLUTION | ORAL | Status: DC | PRN
  Administered 2021-02-13 – 2021-02-17 (×4): 10 mL via ORAL
  Filled 2021-02-12 (×4): qty 10

## 2021-02-12 MED ORDER — METHYLPREDNISOLONE SODIUM SUCC 125 MG IJ SOLR
80.0000 mg | Freq: Two times a day (BID) | INTRAMUSCULAR | Status: DC
  Administered 2021-02-13: 80 mg via INTRAVENOUS
  Filled 2021-02-12: qty 2

## 2021-02-12 MED ORDER — SODIUM CHLORIDE 0.9 % IV SOLN: INTRAVENOUS | Status: DC | PRN

## 2021-02-12 MED ORDER — SODIUM CHLORIDE 0.9 % IV SOLN
100.0000 mg | Freq: Every day | INTRAVENOUS | Status: AC
  Administered 2021-02-13 – 2021-02-16 (×4): 100 mg via INTRAVENOUS
  Filled 2021-02-12 (×4): qty 20

## 2021-02-12 MED ORDER — INSULIN ASPART 100 UNIT/ML IJ SOLN
0.0000 [IU] | Freq: Three times a day (TID) | INTRAMUSCULAR | Status: DC
  Administered 2021-02-13: 5 [IU] via SUBCUTANEOUS
  Filled 2021-02-12: qty 0.15

## 2021-02-12 MED ORDER — SODIUM CHLORIDE 0.9 % IV SOLN
200.0000 mg | Freq: Once | INTRAVENOUS | Status: AC
  Administered 2021-02-12: 200 mg via INTRAVENOUS
  Filled 2021-02-12: qty 40

## 2021-02-12 MED ORDER — ACETAMINOPHEN 325 MG PO TABS
650.0000 mg | ORAL_TABLET | Freq: Four times a day (QID) | ORAL | Status: DC | PRN
  Administered 2021-02-22 – 2021-02-23 (×2): 650 mg via ORAL
  Filled 2021-02-12 (×2): qty 2

## 2021-02-12 MED ORDER — IPRATROPIUM-ALBUTEROL 0.5-2.5 (3) MG/3ML IN SOLN
3.0000 mL | Freq: Four times a day (QID) | RESPIRATORY_TRACT | Status: DC
  Administered 2021-02-12 – 2021-02-15 (×13): 3 mL via RESPIRATORY_TRACT
  Filled 2021-02-12 (×13): qty 3

## 2021-02-12 MED ORDER — ORAL CARE MOUTH RINSE
15.0000 mL | Freq: Two times a day (BID) | OROMUCOSAL | Status: DC
  Administered 2021-02-12 – 2021-03-03 (×33): 15 mL via OROMUCOSAL

## 2021-02-12 MED ORDER — ATORVASTATIN CALCIUM 40 MG PO TABS
40.0000 mg | ORAL_TABLET | Freq: Every day | ORAL | Status: DC
  Administered 2021-02-13 – 2021-03-03 (×19): 40 mg via ORAL
  Filled 2021-02-12 (×19): qty 1

## 2021-02-12 MED ORDER — METHYLPREDNISOLONE SODIUM SUCC 125 MG IJ SOLR
125.0000 mg | Freq: Once | INTRAMUSCULAR | Status: AC
Start: 2021-02-12 — End: 2021-02-12
  Administered 2021-02-12: 125 mg via INTRAVENOUS
  Filled 2021-02-12: qty 2

## 2021-02-12 MED ORDER — POTASSIUM CHLORIDE CRYS ER 20 MEQ PO TBCR
40.0000 meq | EXTENDED_RELEASE_TABLET | Freq: Every day | ORAL | Status: DC
  Administered 2021-02-12 – 2021-02-18 (×7): 40 meq via ORAL
  Filled 2021-02-12 (×8): qty 2

## 2021-02-12 MED ORDER — CHLORHEXIDINE GLUCONATE CLOTH 2 % EX PADS
6.0000 | MEDICATED_PAD | Freq: Every day | CUTANEOUS | Status: DC
  Administered 2021-02-13 – 2021-03-03 (×15): 6 via TOPICAL

## 2021-02-12 MED ORDER — PREDNISONE 50 MG PO TABS: 50.0000 mg | ORAL_TABLET | Freq: Every day | ORAL | Status: DC

## 2021-02-12 MED ORDER — BUDESONIDE 0.25 MG/2ML IN SUSP
0.2500 mg | Freq: Two times a day (BID) | RESPIRATORY_TRACT | Status: DC
  Administered 2021-02-12 – 2021-02-17 (×10): 0.25 mg via RESPIRATORY_TRACT
  Filled 2021-02-12 (×10): qty 2

## 2021-02-12 MED ORDER — ASCORBIC ACID 500 MG PO TABS
500.0000 mg | ORAL_TABLET | Freq: Every day | ORAL | Status: DC
  Administered 2021-02-12 – 2021-03-03 (×20): 500 mg via ORAL
  Filled 2021-02-12 (×20): qty 1

## 2021-02-12 MED ORDER — IPRATROPIUM-ALBUTEROL 0.5-2.5 (3) MG/3ML IN SOLN
3.0000 mL | Freq: Once | RESPIRATORY_TRACT | Status: AC
  Administered 2021-02-12: 3 mL via RESPIRATORY_TRACT
  Filled 2021-02-12: qty 3

## 2021-02-12 MED ORDER — GABAPENTIN 100 MG PO CAPS
100.0000 mg | ORAL_CAPSULE | Freq: Two times a day (BID) | ORAL | Status: DC
  Administered 2021-02-12 – 2021-03-03 (×38): 100 mg via ORAL
  Filled 2021-02-12 (×38): qty 1

## 2021-02-12 MED ORDER — ASPIRIN EC 81 MG PO TBEC
81.0000 mg | DELAYED_RELEASE_TABLET | Freq: Every day | ORAL | Status: DC
  Administered 2021-02-13 – 2021-03-03 (×19): 81 mg via ORAL
  Filled 2021-02-12 (×19): qty 1

## 2021-02-12 MED ORDER — HYDROCOD POLI-CHLORPHE POLI ER 10-8 MG/5ML PO SUER
5.0000 mL | Freq: Two times a day (BID) | ORAL | Status: DC | PRN
  Administered 2021-02-16 – 2021-02-25 (×8): 5 mL via ORAL
  Filled 2021-02-12 (×8): qty 5

## 2021-02-12 MED ORDER — ENSURE ENLIVE PO LIQD
237.0000 mL | Freq: Two times a day (BID) | ORAL | Status: DC
  Administered 2021-02-13 – 2021-02-15 (×4): 237 mL via ORAL

## 2021-02-12 MED ORDER — RIVAROXABAN 20 MG PO TABS
20.0000 mg | ORAL_TABLET | Freq: Every day | ORAL | Status: DC
  Administered 2021-02-12 – 2021-02-17 (×6): 20 mg via ORAL
  Filled 2021-02-12 (×6): qty 1

## 2021-02-12 MED ORDER — INSULIN ASPART 100 UNIT/ML IJ SOLN
0.0000 [IU] | Freq: Every day | INTRAMUSCULAR | Status: DC
  Administered 2021-02-12: 2 [IU] via SUBCUTANEOUS
  Administered 2021-02-13 – 2021-02-14 (×2): 3 [IU] via SUBCUTANEOUS
  Administered 2021-02-15: 4 [IU] via SUBCUTANEOUS
  Administered 2021-02-16: 3 [IU] via SUBCUTANEOUS
  Administered 2021-02-17: 4 [IU] via SUBCUTANEOUS
  Administered 2021-02-18: 21:00:00 3 [IU] via SUBCUTANEOUS
  Administered 2021-02-19 – 2021-02-21 (×3): 2 [IU] via SUBCUTANEOUS
  Administered 2021-02-23: 4 [IU] via SUBCUTANEOUS
  Administered 2021-02-24: 22:00:00 2 [IU] via SUBCUTANEOUS
  Filled 2021-02-12: qty 0.05

## 2021-02-12 MED ORDER — ZINC SULFATE 220 (50 ZN) MG PO CAPS
220.0000 mg | ORAL_CAPSULE | Freq: Every day | ORAL | Status: DC
  Administered 2021-02-12 – 2021-03-03 (×20): 220 mg via ORAL
  Filled 2021-02-12 (×20): qty 1

## 2021-02-12 MED ORDER — SODIUM CHLORIDE 0.9 % IV BOLUS
1000.0000 mL | Freq: Once | INTRAVENOUS | Status: AC
  Administered 2021-02-12: 1000 mL via INTRAVENOUS

## 2021-02-12 NOTE — H&P (Signed)
History and Physical    Patient: Anthony [name]. PJA:[id number] DOB: 1950-05-05 DOA: 02/12/2021 DOS: the patient was seen and examined on 02/12/2021 PCP: [name], DO  Patient coming from: SNF  Chief Complaint:  Chief Complaint  Patient presents with   Shortness of Breath    HPI: Anthony [name]. is a 71 y.o. male with medical history significant of CAD, PAD, HLD, COPD, bipolar, DM. Presenting with dyspnea. History is from daughter. She reports that the facility in which the patient resides had a COVID outbreak a couple of weeks ago. He was diagnosed with COVID somewhere around 02/03/21. She requested that the patient be treated, but she is not sure that he was. He was doing ok as far as she knows until last night and this morning. She received a call that he was hypoxic and that the facility would be sending him to the ED for evaluation. Of note, she reports that he's had multiple falls since having his LAKA. He fell a couple of weeks ago in the tub and complained of chest pain. She otherwise denies any other aggravating or alleviating factors.   Review of Systems: unable to review all systems due to the inability of the patient to answer questions. Past Medical History:  Diagnosis Date   Anxiety    Arthritis    Bipolar disorder ([place])    CAD (coronary artery disease)    a. INF STEMI 07/04/10:  tx with thrombectomy + [facility] to [facility];  cath 07/04/10: dLM 10-20%, pLAD 40-50%, mLAD 20-30%, pRCA 30%, mRCA occluded and tx with PCI, EF 50% with inf HK. A Multilink   COPD (chronic obstructive pulmonary disease) (HCC)    Depression    Glucose intolerance (impaired glucose tolerance)    A1c 6.2 06/2010   History of DVT (deep vein thrombosis)    traumatic, s/p coumadin tx.   HLD (hyperlipidemia)    Hypertension    pt states is currently on no medications   Myocardial infarction ([place])    around 2013   Shortness of breath    walking distance or climbing stairs   Tobacco abuse     Urinary frequency    Vitamin D deficiency 09/05/2018   Past Surgical History:  Procedure Laterality Date   CARDIAC CATHETERIZATION     COLONOSCOPY N/A 12/29/2015   Procedure: COLONOSCOPY;  Surgeon: Rogene Houston, MD;  [facility]: [facility];  Service: Endoscopy;  Laterality: N/A;  12:55   ELECTROCARDIOGRAM     Showed inferolateral ST-elevation and a code STEMI was activated. In the ER, he was treated with morphine, herarin, and 600 mg of Plavix. He was transferred emergently to [facility].    INGUINAL HERNIA REPAIR Left 07/26/2014   Procedure: OPEN REPAIR OF RECURENT LEFT INGUINAL HERNIA REPAIR WITH MESH;  Surgeon: Greer Pickerel, MD;  Location: [facility];  Service: General;  Laterality: Left;   INSERTION OF MESH Bilateral 10/30/2013   Procedure: INSERTION OF MESH;  Surgeon: Gayland Curry, MD;  Location: [facility];  Service: General;  Laterality: Bilateral;   LAPAROSCOPIC INGUINAL HERNIA WITH UMBILICAL HERNIA Bilateral 10/30/2013   Procedure: laparoscopic repair left pantaloom hernia with mesh, laparoscopic right inguinal hernia with mesh, OPEN REPAIR OF UMBILICAL HERNIA ;  Surgeon: Gayland Curry, MD;  Location: [facility];  Service: General;  Laterality: Bilateral;   LAPAROSCOPY N/A 07/26/2014   Procedure: LAPAROSCOPY DIAGNOSTIC;  Surgeon: Greer Pickerel, MD;  Location: [facility];  Service: General;  Laterality: N/A;  POLYPECTOMY  12/29/2015   Procedure: POLYPECTOMY;  Surgeon: Rogene Houston, MD;  [facility];  Service: Endoscopy;;  ascending colon, descending colon   stent placement     TOTAL HIP ARTHROPLASTY Right 05/10/2015   Procedure: RIGHT TOTAL HIP ARTHROPLASTY ANTERIOR APPROACH;  Surgeon: Paralee Cancel, MD;  Location: [facility];  Service: Orthopedics;  Laterality: Right;   VASECTOMY     Social History:  reports that he has been smoking cigarettes. He has a 67.50 pack-year smoking history. He has never used smokeless tobacco. He reports that he does not drink alcohol and does not use  drugs.  Allergies  Allergen Reactions   Flomax [Tamsulosin Hcl] Other (See Comments)    This medication makes patient feel sick   Penicillins Other (See Comments)    Reaction unknown occurred during childhood Has patient had a PCN reaction causing immediate rash, facial/tongue/throat swelling, SOB or lightheadedness with hypotension: unsure - childhood reaction Has patient had a PCN reaction causing severe rash involving mucus membranes or skin necrosis: unsure - childhood reaction Has patient had a PCN reaction that required hospitalization no Has patient had a PCN reaction occurring within the last 10 years: no If all of the above answers are "NO", then may p    Family History  Problem Relation Age of Onset   Coronary artery disease Father    Depression Father    Heart attack Father    Depression Mother     Prior to Admission medications   Medication Sig Start Date End Date Taking? Authorizing Provider  albuterol (VENTOLIN HFA) 108 (90 Base) MCG/ACT inhaler Inhale 2 puffs into the lungs in the morning, at noon, and at bedtime.   Yes [provider]  Ascorbic Acid (VITAMIN C) 1000 MG tablet Take 1,000 mg by mouth daily.   Yes [provider]  aspirin EC 81 MG tablet Take 1 tablet (81 mg total) by mouth daily. 02/25/17  Yes Burnell Blanks, MD  atorvastatin (LIPITOR) 40 MG tablet Take 1 tablet (40 mg total) by mouth daily. 12/22/18  Yes Emeterio Reeve, DO  Baclofen 5 MG TABS Take 5 mg by mouth in the morning and at bedtime.   Yes [provider]  Dextromethorphan-guaiFENesin (ROBITUSSIN COUGH+CHEST CONG DM) 10-200 MG CAPS Take 1 capsule by mouth in the morning, at noon, and at bedtime.   Yes [provider]  gabapentin (NEURONTIN) 100 MG capsule Take 100 mg by mouth 2 (two) times daily.   Yes [provider]  metFORMIN (GLUCOPHAGE-XR) 500 MG 24 hr tablet TAKE 2 TABLETS BY MOUTH  DAILY WITH BREAKFAST Patient taking differently:  Take 750 mg by mouth in the morning and at bedtime. 01/18/20  Yes [name], DO  metoprolol succinate (TOPROL-XL) 25 MG 24 hr tablet TAKE ONE-HALF TABLET BY  MOUTH DAILY Patient taking differently: Take 12.5 mg by mouth daily. 12/12/20  Yes [name], DO  mirtazapine (REMERON) 30 MG tablet TAKE 1 TABLET BY MOUTH AT  BEDTIME Patient taking differently: Take 90 mg by mouth at bedtime. 05/26/20  Yes [name], DO  nystatin ointment (MYCOSTATIN) Apply 1 application topically 2 (two) times daily.   Yes [provider]  rivaroxaban (XARELTO) 20 MG TABS tablet Take 20 mg by mouth daily with supper.   Yes [provider]  Semaglutide 7 MG TABS Take 7 mg by mouth daily.   Yes [provider]  temazepam (RESTORIL) 15 MG capsule TAKE 1 CAPSULE BY MOUTH AT BEDTIME AS  NEEDED FOR SLEEP Patient taking differently: Take 15 mg by mouth at bedtime. 02/05/20  Yes [name], DO  traZODone (DESYREL) 50 MG tablet TAKE 1 TO 2 TABLETS BY MOUTH AT BEDTIME AS NEEDED FOR SLEEP Patient not taking: Reported on 02/12/2021 06/09/20   [name], DO  baclofen (LIORESAL) 10 MG tablet TAKE 1 TABLET BY MOUTH  TWICE DAILY Patient not taking: Reported on 02/12/2021 03/25/20   [name], DO  blood glucose meter kit and supplies KIT Dispense based on patient and insurance preference. Use to check BG once daily or every other other.  Dx: E11.9 07/17/16   [name] A, FNP  gabapentin (NEURONTIN) 800 MG tablet Take 1 tablet (800 mg total) by mouth 3 (three) times daily. Patient not taking: Reported on 02/12/2021 11/14/18   Silverio Decamp, MD  glipiZIDE (GLUCOTROL) 10 MG tablet Take 1 tablet (10 mg total) by mouth 2 (two) times daily before a meal. Patient not taking: Reported on 02/12/2021 12/22/18   Emeterio Reeve, DO  lisinopril (ZESTRIL) 5 MG tablet Take 1 tablet (5 mg total) by mouth daily. Patient not taking: Reported on 02/12/2021 12/22/18   Emeterio Reeve, DO  sertraline  (ZOLOFT) 50 MG tablet Take 1/2 tab (36m) by mouth at bedtime for 6 days then increase to 1 tab (565m by mouth every night at bedtime. Patient not taking: Reported on 02/12/2021 01/22/20   Early, SaCoralee PesaNP  traMADol (ULTRAM) 50 MG tablet TAKE 1 TABLET BY MOUTH EVERY 6 HOURS AS NEEDED FOR SEVERE PAIN Patient not taking: Reported on 02/12/2021 02/24/20   MaLuetta NuttingDO    Physical Exam: Vitals:   02/12/21 1445 02/12/21 1500 02/12/21 1530 02/12/21 1603  BP: 99/61 110/67 130/70 112/78  Pulse: 63 74 61 67  Resp: 19 (!) 23 18 (!) 21  Temp:      TempSrc:      SpO2: 92% 96% 96% 91%   General: [age].o. male resting in bed in NAD Eyes: PERRL, normal sclera ENMT: Nares patent w/o discharge, orophaynx clear, dentition normal, ears w/o discharge/lesions/ulcers Neck: Supple, trachea midline Cardiovascular: RRR, +S1, S2, no m/g/r, equal pulses throughout Respiratory: scattered rhonchi, increased WOB on NRB GI: BS+, NDNT, no masses noted, no organomegaly noted MSK: No e/c/c; LAKA Neuro: A&O x 2, no focal deficits Psyc: confused, flat affect, calm   Data Reviewed:  K+  3.4 Glucose  194 COVID positive CTA chest: no clot; chronic ILD noted; multiple b/l rib fractures  Assessment and Plan: No notes have been filed under this hospital service. Service: Hospitalist Acute hypoxic respiratory failure COVID 19 infection ILD? COPD     - admit to inpt, SDU     - wean from NRB as able     - start remdes, steroids, nebs     - follow inflammatory markers     - add antitussives  Multiple rib fractures     - supportive care     - denies falls; daughter reports that he fell in tub 3 weeks ago; has had mobility issues since LAKA a couple of month ago     - add IS when weaned from NRB  DM2     - carb mod diet, A1c, SSI, glucose checks  Hypokalemia     - check Mg2+; replace K+  CAD PAD Hx of DVT     - continue home regimen including xarelto  HLD     - continue statin  HTN     -  hold  home BP meds d/t soft pressures  Bipolar d/o     - continue home regimen  Advance Care Planning: DNI, confirmed w/ daughter by phone  Consults: None  Family Communication: w/ daughter by phone  Severity of Illness: The appropriate patient status for this patient is INPATIENT. Inpatient status is judged to be reasonable and necessary in order to provide the required intensity of service to ensure the patient's safety. The patient's presenting symptoms, physical exam findings, and initial radiographic and laboratory data in the context of their chronic comorbidities is felt to place them at high risk for further clinical deterioration. Furthermore, it is not anticipated that the patient will be medically stable for discharge from the hospital within 2 midnights of admission.   * I certify that at the point of admission it is my clinical judgment that the patient will require inpatient hospital care spanning beyond 2 midnights from the point of admission due to high intensity of service, high risk for further deterioration and high frequency of surveillance required.*  Author: Jonnie Finner, DO 02/12/2021 4:25 PM  For on call review [email or web address].

## 2021-02-12 NOTE — ED Triage Notes (Addendum)
Pt BIBA from [facility].  Pt tested COVID+ on the 27th, began having respiratory problems on the 29th. Rhonchi in all 4 lobes per EMS. Pt was satting 56 when EMS arrived.   1000 mg Tylenol given by EMS 18 in L forearm. Aox3  BP: 152/86 SPO2: 90 RA HR: 90

## 2021-02-12 NOTE — Plan of Care (Signed)

## 2021-02-12 NOTE — Progress Notes (Signed)
Patient was placed on a non-rebreather at 15 litters to help meet oxygen demand. PO2 probe was replaced. Patient is comfortable in bed, VSS.     Primary RN informed.

## 2021-02-12 NOTE — ED Notes (Signed)
Pt satting in high 80's with  at 6L. Pt sleeping so primarily mouth breathing. Pt placed on NRB at 6L, [place] in mid 54s.

## 2021-02-12 NOTE — ED Provider Notes (Signed)
[facility] DEPT Provider Note   CSN: [id number] Arrival date & time: 02/12/21  1154     History  Chief Complaint  Patient presents with   Shortness of Breath    Tavone Caesar. is a 71 y.o. male.  Patient with history of CAD, T2DM, COPD, and hyperlipidemia presents today with complaints of shortness of breath.  Patient arrives from [facility].  Discussed with staff there he states that he tested positive for COVID on 1/27 and started having 'respiratory symptoms' on 1/29.  Staff is unable to elaborate on this as they state that they have not been there for the past week and this is what they heard at shift change.  She states that upon her arrival today the patient was hypoxic in the 58s which prompted her to call EMS. EMS states that he was satting 78% on room air on presentation, came up to 90s on 5L . Sela Hua states that he is normally alert and oriented x3 with no known history of dementia and able to perform ADLs independently and ambulate without assistance.  Patient denies any symptoms at this time.  When asked if he is short of breath he responds 'thats what they tell me.'  He is currently alert and oriented x2, denies any complaints or pain at this time.  The history is provided by the patient. No language interpreter was used.  Shortness of Breath Associated symptoms: no chest pain and no fever       Home Medications Prior to Admission medications   Medication Sig Start Date End Date Taking? Authorizing Provider  albuterol (VENTOLIN HFA) 108 (90 Base) MCG/ACT inhaler Inhale 2 puffs into the lungs in the morning, at noon, and at bedtime.   Yes [provider]  Ascorbic Acid (VITAMIN C) 1000 MG tablet Take 1,000 mg by mouth daily.   Yes [provider]  aspirin EC 81 MG tablet Take 1 tablet (81 mg total) by mouth daily. 02/25/17  Yes Burnell Blanks, MD  atorvastatin (LIPITOR) 40 MG  tablet Take 1 tablet (40 mg total) by mouth daily. 12/22/18  Yes Emeterio Reeve, DO  Baclofen 5 MG TABS Take 5 mg by mouth in the morning and at bedtime.   Yes [provider]  Dextromethorphan-guaiFENesin (ROBITUSSIN COUGH+CHEST CONG DM) 10-200 MG CAPS Take 1 capsule by mouth in the morning, at noon, and at bedtime.   Yes [provider]  gabapentin (NEURONTIN) 100 MG capsule Take 100 mg by mouth 2 (two) times daily.   Yes [provider]  metFORMIN (GLUCOPHAGE-XR) 500 MG 24 hr tablet TAKE 2 TABLETS BY MOUTH  DAILY WITH BREAKFAST Patient taking differently: Take 750 mg by mouth in the morning and at bedtime. 01/18/20  Yes [name], DO  metoprolol succinate (TOPROL-XL) 25 MG 24 hr tablet TAKE ONE-HALF TABLET BY  MOUTH DAILY Patient taking differently: Take 12.5 mg by mouth daily. 12/12/20  Yes [name], DO  mirtazapine (REMERON) 30 MG tablet TAKE 1 TABLET BY MOUTH AT  BEDTIME Patient taking differently: Take 90 mg by mouth at bedtime. 05/26/20  Yes [name], DO  nystatin ointment (MYCOSTATIN) Apply 1 application topically 2 (two) times daily.   Yes [provider]  rivaroxaban (XARELTO) 20 MG TABS tablet Take 20 mg by mouth daily with supper.   Yes [provider]  Semaglutide 7 MG TABS Take 7 mg by mouth daily.   Yes [provider]  temazepam (RESTORIL) 15 MG capsule TAKE 1 CAPSULE BY MOUTH AT BEDTIME AS NEEDED FOR SLEEP Patient taking differently: Take 15 mg by mouth at bedtime. 02/05/20  Yes [name], DO  traZODone (DESYREL) 50 MG tablet TAKE 1 TO 2 TABLETS BY MOUTH AT BEDTIME AS NEEDED FOR SLEEP Patient not taking: Reported on 02/12/2021 06/09/20   [name], DO  baclofen (LIORESAL) 10 MG tablet TAKE 1 TABLET BY MOUTH  TWICE DAILY Patient not taking: Reported on 02/12/2021 03/25/20   [name], DO  blood glucose meter kit and supplies KIT Dispense based on patient and insurance preference. Use to check BG once  daily or every other other.  Dx: E11.9 07/17/16   [name] A, FNP  gabapentin (NEURONTIN) 800 MG tablet Take 1 tablet (800 mg total) by mouth 3 (three) times daily. Patient not taking: Reported on 02/12/2021 11/14/18   Silverio Decamp, MD  glipiZIDE (GLUCOTROL) 10 MG tablet Take 1 tablet (10 mg total) by mouth 2 (two) times daily before a meal. Patient not taking: Reported on 02/12/2021 12/22/18   Emeterio Reeve, DO  lisinopril (ZESTRIL) 5 MG tablet Take 1 tablet (5 mg total) by mouth daily. Patient not taking: Reported on 02/12/2021 12/22/18   Emeterio Reeve, DO  sertraline (ZOLOFT) 50 MG tablet Take 1/2 tab (84m) by mouth at bedtime for 6 days then increase to 1 tab (549m by mouth every night at bedtime. Patient not taking: Reported on 02/12/2021 01/22/20   Early, SaCoralee PesaNP  traMADol (ULTRAM) 50 MG tablet TAKE 1 TABLET BY MOUTH EVERY 6 HOURS AS NEEDED FOR SEVERE PAIN Patient not taking: Reported on 02/12/2021 02/24/20   MaLuetta NuttingDO      Allergies    Flomax [tamsulosin hcl] and Penicillins    Review of Systems   Review of Systems  Constitutional:  Negative for chills and fever.  Respiratory:  Positive for shortness of breath.   Cardiovascular:  Negative for chest pain.  All other systems reviewed and are negative.  Physical Exam Updated Vital Signs BP 104/67 (BP Location: Left Arm)    Pulse 87    Temp 98.4 F (36.9 C) (Oral)    Resp (!) 24    SpO2 92%  Physical Exam Vitals and nursing note reviewed.  Constitutional:      Comments: Patient somewhat chronically ill appearing resting comfortably in bed in no acute distress on 5L [place]  HENT:     Head: Normocephalic and atraumatic.  Eyes:     Extraocular Movements: Extraocular movements intact.     Pupils: Pupils are equal, round, and reactive to light.  Cardiovascular:     Rate and Rhythm: Normal rate and regular rhythm.  Pulmonary:     Effort: Tachypnea present.     Breath sounds: Rhonchi present.  Chest:      Chest wall: No tenderness.  Abdominal:     General: Bowel sounds are normal.     Palpations: Abdomen is soft.     Tenderness: There is no abdominal tenderness.  Musculoskeletal:     Cervical back: Normal range of motion.     Right lower leg: No tenderness. No edema.     Comments: Left lower leg AKA  Neurological:     Mental Status: He is alert.     Cranial Nerves: No cranial nerve deficit.     Motor: No weakness.     Comments: He is alert and oriented to person and time.  Unable to tell me where  he is.  He is neurologically intact and follows directions, moving all extremities equally.  Psychiatric:        Mood and Affect: Mood normal.        Behavior: Behavior normal.    ED Results / Procedures / Treatments   Labs (all labs ordered are listed, but only abnormal results are displayed) Labs Reviewed  RESP PANEL BY RT-PCR (FLU A&B, COVID) ARPGX2 - Abnormal; Notable for the following components:      Result Value   SARS Coronavirus 2 by RT PCR POSITIVE (*)    All other components within normal limits  CBC WITH DIFFERENTIAL/PLATELET - Abnormal; Notable for the following components:   Abs Immature Granulocytes 0.11 (*)    All other components within normal limits  COMPREHENSIVE METABOLIC PANEL - Abnormal; Notable for the following components:   Potassium 3.4 (*)    Glucose, Bld 194 (*)    Calcium 8.4 (*)    Albumin 3.2 (*)    All other components within normal limits  BLOOD GAS, ARTERIAL - Abnormal; Notable for the following components:   pCO2 arterial 48.4 (*)    All other components within normal limits  I-STAT VENOUS BLOOD GAS, ED  TROPONIN I (HIGH SENSITIVITY)  TROPONIN I (HIGH SENSITIVITY)    EKG None  Radiology CT Angio Chest PE W and/or Wo Contrast  Result Date: 02/12/2021 CLINICAL DATA:  Shortness of breath EXAM: CT ANGIOGRAPHY CHEST WITH CONTRAST TECHNIQUE: Multidetector CT imaging of the chest was performed using the standard protocol during bolus administration  of intravenous contrast. Multiplanar CT image reconstructions and MIPs were obtained to evaluate the vascular anatomy. RADIATION DOSE REDUCTION: This exam was performed according to the departmental dose-optimization program which includes automated exposure control, adjustment of the mA and/or kV according to patient size and/or use of iterative reconstruction technique. CONTRAST:  44m OMNIPAQUE IOHEXOL 350 MG/ML SOLN COMPARISON:  02/11/2018 FINDINGS: Cardiovascular: Satisfactory opacification of the pulmonary arteries to the segmental level. No evidence of pulmonary embolism. Normal heart size. No pericardial effusion. Thoracic aortic atherosclerosis. Coronary artery atherosclerosis. Mediastinum/Nodes: No enlarged mediastinal, hilar, or axillary lymph nodes. Thyroid gland, trachea, and esophagus demonstrate no significant findings. Lungs/Pleura: Bilateral paraseptal emphysema. Bilateral peripheral chronic interstitial lung disease. Previously demonstrated 6 mm left lower lobe pulmonary nodule is not well appreciated on the current exam. Likely obscured by bibasilar atelectasis. 5 mm right middle lobe pulmonary nodule unchanged compared with the prior exam. Adjacent 3 mm pulmonary nodule unchanged compared with the prior exam. No pneumothorax. No pleural effusion. Upper Abdomen: No acute abnormality. Musculoskeletal: Nondisplaced fracture of the anterolateral fifth rib. Nondisplaced fracture of the posterolateral ninth rib. Nondisplaced fracture of the left anterolateral third, fourth, and fifth rib. No aggressive osseous lesion. Review of the MIP images confirms the above findings. IMPRESSION: 1. No acute pulmonary embolus. 2. Chronic interstitial lung disease. 3. Multiple bilateral rib fractures as described above. 4. Coronary artery atherosclerosis. 5. Aortic Atherosclerosis (ICD10-I70.0) and Emphysema (ICD10-J43.9). Electronically Signed   By: HKathreen DevoidM.D.   On: 02/12/2021 15:03   DG Chest Portable 1  View  Result Date: 02/12/2021 CLINICAL DATA:  Shortness of breath. Tested positive for COVID on the 27th of January. EXAM: PORTABLE CHEST 1 VIEW COMPARISON:  Chest x-rays dated 01/30/2019 and 09/08/2018 FINDINGS: Heart size and mediastinal contours are stable. Coarse interstitial lung markings are seen bilaterally, not significantly changed compared to the previous exams. No confluent opacity to suggest a superimposed pneumonia or pulmonary edema. No pleural effusion  or pneumothorax is seen. Osseous structures about the chest are unremarkable. IMPRESSION: 1. No active disease. No evidence of pneumonia or pulmonary edema. 2. Coarse interstitial lung markings bilaterally, not significantly changed compared to the previous exams, consistent with chronic interstitial lung disease/fibrosis. Electronically Signed   By: Franki Cabot M.D.   On: 02/12/2021 13:35    Procedures Procedures    Medications Ordered in ED Medications - No data to display  ED Course/ Medical Decision Making/ A&P                           Medical Decision Making Amount and/or Complexity of Data Reviewed Labs: ordered. Radiology: ordered.  Risk Prescription drug management. Decision regarding hospitalization.   This patient presents to the ED for concern of shortness of breath, this involves an extensive number of treatment options, and is a complaint that carries with it a high risk of complications and morbidity.  The differential diagnosis includes pneumothorax, COVID, pneumonia, PE, ACS, COPD exacerbation. This list is not comprehensive   Co morbidities that complicate the patient evaluation  CAD, T2DM, COPD, hyperlipidemia   Additional history obtained:  Additional history obtained from [facility] facility   Lab Tests:  I Ordered, and personally interpreted labs.  The pertinent results include:  COVID positive, no leukocytosis or anemia.  Mild hypokalemia at 3.4.  Troponin negative. pCO2 on ABG  48.4, no other changes   Imaging Studies ordered:  I ordered imaging studies including CXR and CTPE  I independently visualized and interpreted imaging which showed  CXR 1. No active disease. No evidence of pneumonia or pulmonary edema. 2. Coarse interstitial lung markings bilaterally, not significantly changed compared to the previous exams, consistent with chronic interstitial lung disease/fibrosis. CTPE 1. No acute pulmonary embolus. 2. Chronic interstitial lung disease. 3. Multiple bilateral rib fractures I agree with the radiologist interpretation   Cardiac Monitoring:  The patient was maintained on a cardiac monitor.  I personally viewed and interpreted the cardiac monitored which showed an underlying rhythm of: sinus rhythm   Medicines ordered and prescription drug management:  I ordered medication including fluids for dehydration and hypotension and duonebs and solu-medrol for shortness of breath and hypoxia Reevaluation of the patient after these medicines showed that the patient improved I have reviewed the patients home medicines and have made adjustments as needed   Social Determinants of Health:  Patient from assisted living facility   Dispostion:  After consideration of the diagnostic results and the patients response to treatment, I feel that the patent would benefit from inpatient management of hypoxic respiratory failure.  Patient presents with new onset hypoxia with new oxygen requirement from skilled nursing facility.  Tested positive for COVID on 1/27 and apparently started having respiratory symptoms on 1/29.  Was found to be hypoxic in the 70s with EMS and came up to the 90s on 5 L nasal cannula.  Upon my evaluation he was still on 5 L nasal cannula.  Apparently, nursing staff placed him on 10 L via nonrebreather because he kept taking off the nasal cannula and desatting.  He is alert and oriented x2, seemingly a bit more confused than baseline according to  nursing home staff.  However he is neurologically intact and following commands moving all extremities equally without difficulty.  Patient's labs overall unremarkable and imaging of his chest revealed no signs of PE, pneumothorax, or pneumonia.  Did reveal several rib fractures, unclear how he  sustained these, patient and facility denies any recent falls. His EKG was unchanged from baseline. Low suspicion for ACS.  It was originally thought that patient's symptoms were due to COVID-pneumonia given his recent positive COVID test, however his imaging did not reveal this.  Therefore we will proceed with treating this as a COPD exacerbation and order nebs and steroids.  Will seek admission for hypoxic respiratory failure with new oxygen requirement.  Discussed patient with hospitalist who agrees to admit.   This is a shared visit with supervising physician Dr. Tamera Punt who has independently evaluated patient & provided guidance in evaluation/management/disposition, in agreement with care    Final Clinical Impression(s) / ED Diagnoses Final diagnoses:  Acute respiratory failure with hypoxia [facility])  COVID-19    Rx / DC Orders ED Discharge Orders     None         Nestor Lewandowsky 02/12/21 1645    Malvin Johns, MD 02/16/21 [phone]

## 2021-02-12 NOTE — Plan of Care (Signed)
  Problem: Pain Managment: Goal: General experience of comfort will improve Outcome: Progressing   

## 2021-02-13 ENCOUNTER — Encounter (HOSPITAL_COMMUNITY): Payer: Self-pay | Admitting: Internal Medicine

## 2021-02-13 DIAGNOSIS — Z86718 Personal history of other venous thrombosis and embolism: Secondary | ICD-10-CM

## 2021-02-13 DIAGNOSIS — S2249XA Multiple fractures of ribs, unspecified side, initial encounter for closed fracture: Secondary | ICD-10-CM

## 2021-02-13 DIAGNOSIS — L8990 Pressure ulcer of unspecified site, unspecified stage: Secondary | ICD-10-CM | POA: Insufficient documentation

## 2021-02-13 DIAGNOSIS — U071 COVID-19: Principal | ICD-10-CM

## 2021-02-13 DIAGNOSIS — J439 Emphysema, unspecified: Secondary | ICD-10-CM

## 2021-02-13 DIAGNOSIS — S2243XA Multiple fractures of ribs, bilateral, initial encounter for closed fracture: Secondary | ICD-10-CM

## 2021-02-13 DIAGNOSIS — I10 Essential (primary) hypertension: Secondary | ICD-10-CM

## 2021-02-13 LAB — CBC WITH DIFFERENTIAL/PLATELET
Abs Immature Granulocytes: 0.12 10*3/uL — ABNORMAL HIGH (ref 0.00–0.07)
Basophils Absolute: 0 10*3/uL (ref 0.0–0.1)
Basophils Relative: 0 %
Eosinophils Absolute: 0 10*3/uL (ref 0.0–0.5)
Eosinophils Relative: 0 %
HCT: 48.6 % (ref 39.0–52.0)
Hemoglobin: 16.1 g/dL (ref 13.0–17.0)
Immature Granulocytes: 3 %
Lymphocytes Relative: 16 %
Lymphs Abs: 0.8 10*3/uL (ref 0.7–4.0)
MCH: 29.3 pg (ref 26.0–34.0)
MCHC: 33.1 g/dL (ref 30.0–36.0)
MCV: 88.4 fL (ref 80.0–100.0)
Monocytes Absolute: 0.2 10*3/uL (ref 0.1–1.0)
Monocytes Relative: 3 %
Neutro Abs: 3.8 10*3/uL (ref 1.7–7.7)
Neutrophils Relative %: 78 %
Platelets: 245 10*3/uL (ref 150–400)
RBC: 5.5 MIL/uL (ref 4.22–5.81)
RDW: 12.6 % (ref 11.5–15.5)
WBC: 4.9 10*3/uL (ref 4.0–10.5)
nRBC: 0 % (ref 0.0–0.2)

## 2021-02-13 LAB — GLUCOSE, CAPILLARY
Glucose-Capillary: 229 mg/dL — ABNORMAL HIGH (ref 70–99)
Glucose-Capillary: 224 mg/dL — ABNORMAL HIGH (ref 70–99)
Glucose-Capillary: 337 mg/dL — ABNORMAL HIGH (ref 70–99)
Glucose-Capillary: 257 mg/dL — ABNORMAL HIGH (ref 70–99)

## 2021-02-13 LAB — COMPREHENSIVE METABOLIC PANEL
ALT: 21 U/L (ref 0–44)
AST: 17 U/L (ref 15–41)
Albumin: 3.1 g/dL — ABNORMAL LOW (ref 3.5–5.0)
Alkaline Phosphatase: 49 U/L (ref 38–126)
Anion gap: 7 (ref 5–15)
BUN: 13 mg/dL (ref 8–23)
CO2: 25 mmol/L (ref 22–32)
Calcium: 8.4 mg/dL — ABNORMAL LOW (ref 8.9–10.3)
Chloride: 108 mmol/L (ref 98–111)
Creatinine, Ser: 0.82 mg/dL (ref 0.61–1.24)
GFR, Estimated: >60 mL/min (ref >60)
Glucose, Bld: 240 mg/dL — ABNORMAL HIGH (ref 70–99)
Potassium: 4.5 mmol/L (ref 3.5–5.1)
Sodium: 140 mmol/L (ref 135–145)
Total Bilirubin: 0.3 mg/dL (ref 0.3–1.2)
Total Protein: 6.9 g/dL (ref 6.5–8.1)

## 2021-02-13 LAB — C-REACTIVE PROTEIN: CRP: 2.8 mg/dL — ABNORMAL HIGH (ref <1.0)

## 2021-02-13 LAB — FERRITIN: Ferritin: 144 ng/mL (ref 24–336)

## 2021-02-13 LAB — D-DIMER, QUANTITATIVE: D-Dimer, Quant: 1.87 ug/mL-FEU — ABNORMAL HIGH (ref 0.00–0.50)

## 2021-02-13 LAB — MAGNESIUM: Magnesium: 1.9 mg/dL (ref 1.7–2.4)

## 2021-02-13 MED ORDER — ADULT MULTIVITAMIN W/MINERALS CH
1.0000 | ORAL_TABLET | Freq: Every day | ORAL | Status: DC
  Administered 2021-02-13 – 2021-03-03 (×19): 1 via ORAL
  Filled 2021-02-13 (×19): qty 1

## 2021-02-13 MED ORDER — METHYLPREDNISOLONE SODIUM SUCC 125 MG IJ SOLR
80.0000 mg | Freq: Two times a day (BID) | INTRAMUSCULAR | Status: DC
  Administered 2021-02-13 – 2021-02-18 (×10): 80 mg via INTRAVENOUS
  Filled 2021-02-13 (×10): qty 2

## 2021-02-13 MED ORDER — ENSURE MAX PROTEIN PO LIQD
11.0000 [oz_av] | Freq: Every day | ORAL | Status: DC
  Administered 2021-02-13 – 2021-03-03 (×13): 11 [oz_av] via ORAL
  Filled 2021-02-13 (×21): qty 330

## 2021-02-13 MED ORDER — MIRTAZAPINE 15 MG PO TABS
30.0000 mg | ORAL_TABLET | Freq: Every day | ORAL | Status: DC
  Administered 2021-02-13 – 2021-03-02 (×18): 30 mg via ORAL
  Filled 2021-02-13 (×18): qty 2

## 2021-02-13 MED ORDER — GLUCERNA SHAKE PO LIQD
237.0000 mL | Freq: Two times a day (BID) | ORAL | Status: DC
  Administered 2021-02-13 – 2021-03-03 (×32): 237 mL via ORAL
  Filled 2021-02-13 (×37): qty 237

## 2021-02-13 MED ORDER — INSULIN ASPART 100 UNIT/ML IJ SOLN
0.0000 [IU] | Freq: Three times a day (TID) | INTRAMUSCULAR | Status: DC
  Administered 2021-02-13: 7 [IU] via SUBCUTANEOUS
  Administered 2021-02-13: 15 [IU] via SUBCUTANEOUS
  Administered 2021-02-14 (×3): 11 [IU] via SUBCUTANEOUS
  Administered 2021-02-15 (×2): 15 [IU] via SUBCUTANEOUS
  Administered 2021-02-15: 11 [IU] via SUBCUTANEOUS
  Administered 2021-02-16: 20 [IU] via SUBCUTANEOUS
  Administered 2021-02-16 – 2021-02-17 (×4): 11 [IU] via SUBCUTANEOUS
  Administered 2021-02-17: 20 [IU] via SUBCUTANEOUS
  Administered 2021-02-18: 15 [IU] via SUBCUTANEOUS
  Administered 2021-02-18 (×2): 11 [IU] via SUBCUTANEOUS
  Administered 2021-02-19: 4 [IU] via SUBCUTANEOUS
  Administered 2021-02-19: 7 [IU] via SUBCUTANEOUS
  Administered 2021-02-19: 11 [IU] via SUBCUTANEOUS
  Administered 2021-02-20: 3 [IU] via SUBCUTANEOUS
  Administered 2021-02-20: 15 [IU] via SUBCUTANEOUS
  Administered 2021-02-20: 7 [IU] via SUBCUTANEOUS
  Administered 2021-02-21: 4 [IU] via SUBCUTANEOUS
  Administered 2021-02-21: 3 [IU] via SUBCUTANEOUS
  Administered 2021-02-21: 4 [IU] via SUBCUTANEOUS
  Administered 2021-02-22: 7 [IU] via SUBCUTANEOUS
  Administered 2021-02-22 (×2): 4 [IU] via SUBCUTANEOUS
  Administered 2021-02-23: 7 [IU] via SUBCUTANEOUS
  Administered 2021-02-23: 15 [IU] via SUBCUTANEOUS
  Administered 2021-02-23 – 2021-02-24 (×4): 4 [IU] via SUBCUTANEOUS
  Administered 2021-02-25 – 2021-02-28 (×6): 3 [IU] via SUBCUTANEOUS
  Administered 2021-03-01: 4 [IU] via SUBCUTANEOUS
  Administered 2021-03-01: 7 [IU] via SUBCUTANEOUS
  Administered 2021-03-02: 11 [IU] via SUBCUTANEOUS
  Administered 2021-03-02: 3 [IU] via SUBCUTANEOUS
  Administered 2021-03-03: 4 [IU] via SUBCUTANEOUS

## 2021-02-13 MED ORDER — INSULIN ASPART 100 UNIT/ML IJ SOLN: 0.0000 [IU] | Freq: Three times a day (TID) | INTRAMUSCULAR | Status: DC

## 2021-02-13 NOTE — Hospital Course (Addendum)
Anthony Waas. is an 71 y.o. 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  The patient elected to be a DNI, PCCM was consulted on February 18, 2020 for acute respiratory failure with hypoxia

## 2021-02-13 NOTE — Progress Notes (Signed)
Inpatient Diabetes Program Recommendations  AACE/ADA: New Consensus Statement on Inpatient Glycemic Control (2015)  Target Ranges:  Prepandial:   less than 140 mg/dL      Peak postprandial:   less than 180 mg/dL (1-2 hours)      Critically ill patients:  140 - 180 mg/dL   Lab Results  Component Value Date   GLUCAP 229 (H) 02/13/2021   HGBA1C 7.7 (H) 02/12/2021    Review of Glycemic Control  Latest Reference Range & Units 02/12/21 20:02 02/12/21 23:26 02/13/21 07:40  Glucose-Capillary 70 - 99 mg/dL 212 (H) 317 (H) 229 (H)  (H): Data is abnormally high Diabetes history: Type 2 DM Outpatient Diabetes medications: Semaglutide 7 mg QD, Metformin 500 mg BID, Glipizide 10 mg BID Current orders for Inpatient glycemic control: Novolog 0-15 units TID & HS,  Solumedrol 80 mg BID  Inpatient Diabetes Program Recommendations:    Consider adding Levemir 10 units BID and change diet to carb modified.   Thanks, Bronson Curb, MSN, RNC-OB Diabetes Coordinator [phone] (8a-5p)

## 2021-02-13 NOTE — TOC Initial Note (Signed)
Transition of Care (TOC) - Initial/Assessment Note    Patient Details  Name: Anthony [name]. MRN: [id number] Date of Birth: [birth year]  Transition of Care [facility]) CM/SW Contact:    Lennart Pall, LCSW Phone Number: 02/13/2021, 3:50 PM  Clinical Narrative:                 Contacted pt via phone call to room (COVID precautions) to introduce self/ TOC role.  Pt very pleasant and confirms he has been a LTC resident at [facility] for ~ 5 months and his dc plan is to return there.  Notes his daughter, Jeannetta Nap, is primary family contact.  He denies any concerns at this time and agreeable with TOC to follow and assist with SNF return when medically cleared.  Expected Discharge Plan: [place] Barriers to Discharge: Continued Medical Work up   Patient Goals and CMS Choice Patient states their goals for this hospitalization and ongoing recovery are:: return to [employer] [facility]      Expected Discharge Plan and Services Expected Discharge Plan: [place] In-house Referral: Clinical Social Work   Post Acute Care Choice: [place] Living arrangements for the past 2 months: [place]                 DME Arranged: N/A DME Agency: NA                  Prior Living Arrangements/Services Living arrangements for the past 2 months: [place] Lives with:: Facility Resident Patient language and need for interpreter reviewed:: No Do you feel safe going back to the place where you live?: Yes      Need for Family Participation in Patient Care: No (Comment) Care giver support system in place?: Yes (comment)   Criminal Activity/Legal Involvement Pertinent to Current Situation/Hospitalization: No - Comment as needed  Activities of Daily Living Home Assistive Devices/Equipment: Hospital bed, Wheelchair ADL Screening (condition at time of admission) Patient's cognitive ability adequate to safely complete daily activities?:  Yes Is the patient deaf or have difficulty hearing?: No Does the patient have difficulty seeing, even when wearing glasses/contacts?: No Does the patient have difficulty concentrating, remembering, or making decisions?: No Patient able to express need for assistance with ADLs?: Yes Does the patient have difficulty dressing or bathing?: Yes Independently performs ADLs?: No Communication: Independent Dressing (OT): Dependent Is this a change from baseline?: Pre-admission baseline Grooming: Needs assistance Is this a change from baseline?: Pre-admission baseline Feeding: Independent Bathing: Dependent Is this a change from baseline?: Pre-admission baseline Toileting: Dependent Is this a change from baseline?: Pre-admission baseline In/Out Bed: Dependent Is this a change from baseline?: Pre-admission baseline Walks in Home: Dependent Is this a change from baseline?: Pre-admission baseline Does the patient have difficulty walking or climbing stairs?: Yes Weakness of Legs: Both Weakness of Arms/Hands: Both  Permission Sought/Granted Permission sought to share information with : Facility Sport and exercise psychologist, Family Supports Permission granted to share information with : Yes, Verbal Permission Granted  Share Information with NAME: Kerin Ransom     Permission granted to share info w Relationship: daughter  Permission granted to share info w Contact Information: [phone]  Emotional Assessment   Attitude/Demeanor/Rapport: Gracious Affect (typically observed): Accepting Orientation: : Oriented to Self, Oriented to Place, Oriented to  Time, Oriented to Situation Alcohol / Substance Use: Not Applicable Psych Involvement: No (comment)  Admission diagnosis:  Acute respiratory failure with hypoxia ([place]) [J96.01] COVID-19 [U07.1] Patient Active Problem List  Diagnosis Date Noted   Pressure injury of skin 02/13/2021   Rib fractures 02/13/2021   History of DVT (deep vein  thrombosis) 02/13/2021   HTN (hypertension) 02/13/2021   Acute respiratory failure with hypoxia ([place]) 02/12/2021   COVID-19 virus infection 02/12/2021   Hypokalemia 02/12/2021   Bipolar 1 disorder ([place]) 02/12/2021   Right flank pain 11/01/2019   Fall against object 09/25/2019   Cerebellar ataxia in diseases classified elsewhere ([place]) 08/26/2019   Chronic pain of right knee 07/14/2019   Chronic constipation 03/24/2019   Spinal stenosis of lumbar region with neurogenic claudication 11/14/2018   Vitamin D deficiency 09/05/2018   Elevated vitamin B12 level 09/05/2018   Acute neutrophilia 09/05/2018   Chronic fatigue 08/29/2018   Generalized weakness 08/29/2018   Multiple lung nodules 08/03/2018   Insomnia 03/26/2017   Aortic atherosclerosis ([place]) 10/19/2016   Pain medication agreement signed 06/05/2016   DM2 (diabetes mellitus, type 2) ([place]) 05/25/2016   Obesity (BMI 30-39.9) 04/13/2016   Rectal bleeding 11/23/2015   Moderate episode of recurrent major depressive disorder ([place]) 10/18/2015   S/P right THA, AA 05/10/2015   Recurrent left inguinal hernia 07/26/2014   Chronic right hip pain 06/23/2014   S/P bilateral inguinal hernia repair 10/30/2013   Bilateral inguinal hernia 38/32/9191   Umbilical hernia 66/06/43   Mood disorder ([place])    CAD (coronary artery disease)    Hyperlipidemia associated with type 2 diabetes mellitus ([place])    COPD (chronic obstructive pulmonary disease) ([place])    Tobacco abuse    PCP:  [name], DO Pharmacy:   [facility] [address] [place] [address] Phone: [phone] Fax: [phone]  [name] ([place], [place] [facility] [address] [place] [address] [place] [address] Phone: [phone] Fax: [phone]  [facility] ([name] ) - [place], [place] - [address] [place] [address]  [place] [address] Phone: [phone] Fax: [phone]     Social Determinants of Health (SDOH) Interventions    Readmission Risk Interventions Readmission Risk Prevention Plan 02/13/2021  Transportation Screening Complete  PCP or Specialist Appt within 5-7 Days Complete  Home Care Screening Complete  Medication Review (RN CM) Complete  Some recent data might be hidden

## 2021-02-13 NOTE — Progress Notes (Signed)
Progress Note   Patient: Anthony [name]. HCW:[id number] DOB: [birth year] DOA: 02/12/2021     1 DOS: the patient was seen and examined on 02/13/2021   Brief hospital course: [name] is a 71 yo male with PMH CAD, PAD, HLD, COPD, bipolar, DM II who presented with worsening dyspnea at home.  He resides at a skilled nursing facility and was recently diagnosed with [place] outpatient around approximately 02/03/2021.  Due to becoming hypoxic, he was sent to the ER for further evaluation. He also underwent left AKA on 09/21/2020 and has had recurrent falls since. He was positive for COVID-19 on evaluation in the ER as well as significantly hypoxic requiring nonrebreather.  He was started on remdesivir, steroids, and breathing treatments. He was also evaluated for trauma from his falls and found to have multiple rib fractures bilaterally.  Assessment and Plan: * Acute respiratory failure with hypoxia ([place])- (present on admission) - Chronic underlying lung disease.  We will need to clarify if he is on oxygen at baseline as well - CTA chest shows bilateral paraseptal emphysema with chronic ILD.  Also has multiple pulmonary nodules. -Etiology for worsening hypoxia felt due to exacerbation of underlying COPD as well as superimposed COVID infection - Continue nonrebreather and wean as able  COVID-19 virus infection - Reportedly diagnosed approximately 1 week prior to admission but unclear if he has been treated - Given worsening oxygen demands and several comorbidities, continue remdesivir -Continue steroids in setting of worsening hypoxia as well - Trend inflammatory markers  COPD (chronic obstructive pulmonary disease) ([place])- (present on admission) - Some probable exacerbation in setting of underlying COVID infection - Continue steroids, breathing treatments - Encourage incentive spirometer once off of nonrebreather  Hypokalemia - Replete as needed  Rib fractures - Likely due to recurrent falls  after his left AKA - CTA chest shows left 3rd - 5th and left 5th and 9th -Encourage incentive spirometer use when off of nonrebreather - Continue pain control and otherwise supportive care  HTN (hypertension) - BP meds on hold in setting of soft pressures on admission  History of DVT (deep vein thrombosis) - Continue Xarelto  Bipolar 1 disorder (HCC) - continue mirtazapine  Multiple lung nodules- (present on admission) - Appears to be known from 2020 - CTA chest shows 6 mm left lower lobe, 3 and 5 mm right middle lobe - appears stable/smaller from prior imagining - continue outpatient monitoring   DM2 (diabetes mellitus, type 2) (HCC) - A1c 7.7% on admission - Continue SSI and CBG monitoring - CBGs elevated some in setting of steroid use  Hyperlipidemia associated with type 2 diabetes mellitus ([place])- (present on admission) - Continue Lipitor  CAD (coronary artery disease)- (present on admission) - Continue aspirin, statin, Xarelto - hold BB in setting of mild bradycardia       Subjective: Seen this morning in his room.  Breathing was mildly improved although he remains on nonrebreather.  Slightly poor historian but states he only started feeling bad just prior to admission.  Physical Exam: Vitals:   02/13/21 0820 02/13/21 0824 02/13/21 0900 02/13/21 1144  BP:   132/73   Pulse: 61  64   Resp: 15  20   Temp:    97.6 F (36.4 C)  TempSrc:    Axillary  SpO2: 93% 94% 94%   Weight:      Height:       Physical Exam Constitutional:      General: He is not in acute  distress.    Comments: Elderly man laying in bed appearing fatigued but in NAD  HENT:     Head: Normocephalic and atraumatic.     Mouth/Throat:     Mouth: Mucous membranes are moist.  Eyes:     Extraocular Movements: Extraocular movements intact.  Cardiovascular:     Rate and Rhythm: Normal rate and regular rhythm.     Heart sounds: Normal heart sounds.  Pulmonary:     Effort: Pulmonary effort is  normal. No respiratory distress.     Comments: Bilateral coarse breath sounds Abdominal:     General: Bowel sounds are normal. There is no distension.     Palpations: Abdomen is soft.     Tenderness: There is no abdominal tenderness.  Musculoskeletal:        General: Normal range of motion.     Cervical back: Normal range of motion and neck supple.  Skin:    General: Skin is warm and dry.  Neurological:     General: No focal deficit present.  Psychiatric:        Mood and Affect: Mood normal.      Data Reviewed:  I have Reviewed nursing notes, Vitals, and Lab results since pt's last encounter. Pertinent lab results Glucose 240, creat 0.82, wbc 4.9  I have ordered test including BMP, Mg, CBC I have independently visualized and interpreted imaging CTA chest which showed chronic lung disease. I have reviewed the last note from all staff over past 24 hours,  I have discussed pt's care plan and test results with nursing staff.   Family Communication:   Disposition: Status is: Inpatient Remains inpatient appropriate because: covid treatment, respiratory failure     Planned Discharge Destination: Skilled nursing facility      Author: Dwyane Dee, MD 02/13/2021 12:58 PM  For on call review [email or web address].

## 2021-02-13 NOTE — Assessment & Plan Note (Addendum)
-   continue mirtazapine,  -Flat affect now improved; pulmonology started Lexapro and as needed Atarax

## 2021-02-13 NOTE — Assessment & Plan Note (Addendum)
-   Likely due to recurrent falls after his left AKA - CTA chest shows left 3rd - 5th and left 5th and 9th rib fractures -Continue incentive spirometry

## 2021-02-13 NOTE — Assessment & Plan Note (Addendum)
-  Mild exacerbation in the setting of ongoing COVID infection  --Chest x-ray showed bibasilar left greater than right areas of atelectasis or consolidation.  Procalcitonin <0.1 - appreciate pulmonology assistance

## 2021-02-13 NOTE — Assessment & Plan Note (Addendum)
-  Currently on aspirin, beta-blocker, statin, eliquis

## 2021-02-13 NOTE — Assessment & Plan Note (Addendum)
Patient was on Xarelto prior to admission. -Venous Doppler showed a new DVT in the right lower extremity from proximal femoral to the popliteal and in single posterior tibial vein.   Also has bilateral arterial disease with occlusion of the proximal-distal LT SFA and a focal dilitation of the proximal RT SFA to 2.5 cm -Patient has known history of PAD.  Placed on therapeutic eliquis.

## 2021-02-13 NOTE — Assessment & Plan Note (Addendum)
-   Chronic underlying lung disease, states he is on O2 at home. CTA chest shows bilateral paraseptal emphysema with chronic ILD.  Also has multiple pulmonary nodules.  No PE. - pleural effusion not large enough to tap -Etiology for worsening hypoxia felt due to exacerbation of underlying COPD as well as superimposed COVID infection. Presumption is that he should improve slowly but steadily to return close to home settings - Difficult to wean O2, still on 15 L HFNC. May need LTACH for further slow weaning due to anticipated prolonged hospitalization  -On eliquis - echo on 2/14 shows EF 60-65%, normal diastology -Per cardiology, started on Lasix 20 mg p.o. every 48 hours; will need intermittent renal function monitoring at discharge as well

## 2021-02-13 NOTE — Progress Notes (Signed)
Initial Nutrition Assessment  DOCUMENTATION CODES:   Not applicable  INTERVENTION:  - will order Ensure Max once/day, each supplement provides 150 kcal and 30 grams of protein. - will order Glucerna Shake BID, each supplement provides 220 kcal and 10 grams of protein. - will order multivitamin with minerals daily   NUTRITION DIAGNOSIS:   Increased nutrient needs related to acute illness, catabolic illness (JMEQA-83 infection) as evidenced by estimated needs.  GOAL:   Patient will meet greater than or equal to 90% of their needs  MONITOR:   PO intake, Supplement acceptance, Labs, Weight trends  REASON FOR ASSESSMENT:   Malnutrition Screening Tool  ASSESSMENT:   71 y.o. male with medical history of CAD, PAD, HLD, COPD, bipolar disorder, and DM. he presented to the ED with dyspnea, hypoxia at a facility, and outpatient dx of COVID-19 (around 02/03/21). He had L AKA on 09/21/20 and his daughter reported he has had multiple falls since that time.  Patient sitting up in bed eating lunch at the time of RD visit. No visitors present at the time of RD visit. Able to talk with RN prior to entering patient's room.   Per documentation in flow sheet, he ate 100% of dinner last night. RN reports he ate well for breakfast this AM. Patient had eaten all of lunch tray except for chicken--cut this up for him and he was going to try to eat some after RD left the room.  He denies any changes in appetite or eating habits PTA. Denies any chewing or swallowing difficulties. Denies any weight changes. Asked patient about mobility PTA and if he uses a walker or has a prosthetic but patient does not respond appropriately to the question.  Weight yesterday was 192 lb and weight on 01/15/20 was 208 lb. This indicates 16 lb weight loss (8% body weight) in the past 13 months; not significant for time frame.     Labs reviewed; HgbA1c: 7.7%, CBGs: 229, 224, 337 mg/dl, Ca: 8.4 mg/dl.  Medications reviewed;  500 mg ascorbic acid/day, sliding scale novolog, 80 solu-medrol BID, 40 mEq Klor-Con/day, 200 mg IV remdesivir x1 dose 2/5, 100 mg IV remdesivir x1 dose/day (2/6-2/9), 220 mg zinc sulfate/day.     NUTRITION - FOCUSED PHYSICAL EXAM:  Flowsheet Row Most Recent Value  Orbital Region No depletion  Upper Arm Region Mild depletion  Thoracic and Lumbar Region Unable to assess  Buccal Region No depletion  Temple Region No depletion  Clavicle Bone Region Mild depletion  Clavicle and Acromion Bone Region No depletion  Scapular Bone Region No depletion  Dorsal Hand Mild depletion  Patellar Region No depletion  Anterior Thigh Region No depletion  Posterior Calf Region No depletion  [R side only,  s/p L AKA]  Edema (RD Assessment) Mild  [RLE]  Hair Reviewed  Eyes Reviewed  Mouth Reviewed  Skin Reviewed  Nails Reviewed       Diet Order:   Diet Order             Diet Carb Modified Fluid consistency: Thin; Room service appropriate? Yes  Diet effective now                   EDUCATION NEEDS:   No education needs have been identified at this time  Skin:  Skin Assessment: Skin Integrity Issues: Skin Integrity Issues:: Stage II Stage II: buttocks  Last BM:  PTA/unknown  Height:   Ht Readings from Last 1 Encounters:  02/12/21 5\' 9"  (1.753 m)  Weight:   Wt Readings from Last 1 Encounters:  02/12/21 87.2 kg     BMI:  Body mass index is 28.39 kg/m.   Estimated Nutritional Needs:  Kcal:  1900-2100 kcal Protein:  105-120 grams Fluid:  >/= 2.3 L/day     Jarome Matin, MS, RD, LDN Inpatient Clinical Dietitian RD pager # available in [place]  After hours/weekend pager # available in [facility]

## 2021-02-13 NOTE — Assessment & Plan Note (Addendum)
-  Continue Lipitor °

## 2021-02-13 NOTE — Assessment & Plan Note (Addendum)
BP stable.

## 2021-02-13 NOTE — Assessment & Plan Note (Addendum)
-   Appears to be known from 2020 - CTA chest shows 6 mm left lower lobe, 3 and 5 mm right middle lobe - appears stable/smaller from prior imagining, continue outpatient monitoring -Will need outpatient pulmonology follow-up

## 2021-02-13 NOTE — Assessment & Plan Note (Addendum)
-   Replete PRN

## 2021-02-13 NOTE — Assessment & Plan Note (Addendum)
-   Reportedly diagnosed approximately 1 week prior to admission but unclear if he has been treated - Given worsening oxygen demands and several comorbidities, completed remdesivir on 2/9  -On p.o. prednisone, Pulmicort - okay to d/c precautions; done on 2/17

## 2021-02-13 NOTE — Assessment & Plan Note (Addendum)
-   A1c 7.7% on admission - CBGs elevated due to steroids, improving since transition to oral prednisone. -Continue Semglee and SSI

## 2021-02-14 ENCOUNTER — Other Ambulatory Visit (HOSPITAL_COMMUNITY): Payer: Self-pay

## 2021-02-14 DIAGNOSIS — E119 Type 2 diabetes mellitus without complications: Secondary | ICD-10-CM

## 2021-02-14 DIAGNOSIS — Z794 Long term (current) use of insulin: Secondary | ICD-10-CM

## 2021-02-14 LAB — COMPREHENSIVE METABOLIC PANEL
ALT: 18 U/L (ref 0–44)
AST: 14 U/L — ABNORMAL LOW (ref 15–41)
Albumin: 2.9 g/dL — ABNORMAL LOW (ref 3.5–5.0)
Alkaline Phosphatase: 43 U/L (ref 38–126)
Anion gap: 6 (ref 5–15)
BUN: 22 mg/dL (ref 8–23)
CO2: 26 mmol/L (ref 22–32)
Calcium: 8.6 mg/dL — ABNORMAL LOW (ref 8.9–10.3)
Chloride: 105 mmol/L (ref 98–111)
Creatinine, Ser: 0.72 mg/dL (ref 0.61–1.24)
GFR, Estimated: >60 mL/min (ref >60)
Glucose, Bld: 252 mg/dL — ABNORMAL HIGH (ref 70–99)
Potassium: 4.5 mmol/L (ref 3.5–5.1)
Sodium: 137 mmol/L (ref 135–145)
Total Bilirubin: 0.5 mg/dL (ref 0.3–1.2)
Total Protein: 6.2 g/dL — ABNORMAL LOW (ref 6.5–8.1)

## 2021-02-14 LAB — CBC WITH DIFFERENTIAL/PLATELET
Abs Immature Granulocytes: 0.15 10*3/uL — ABNORMAL HIGH (ref 0.00–0.07)
Basophils Absolute: 0 10*3/uL (ref 0.0–0.1)
Basophils Relative: 0 %
Eosinophils Absolute: 0 10*3/uL (ref 0.0–0.5)
Eosinophils Relative: 0 %
HCT: 44.9 % (ref 39.0–52.0)
Hemoglobin: 14.8 g/dL (ref 13.0–17.0)
Immature Granulocytes: 1 %
Lymphocytes Relative: 8 %
Lymphs Abs: 0.9 10*3/uL (ref 0.7–4.0)
MCH: 29.5 pg (ref 26.0–34.0)
MCHC: 33 g/dL (ref 30.0–36.0)
MCV: 89.6 fL (ref 80.0–100.0)
Monocytes Absolute: 0.6 10*3/uL (ref 0.1–1.0)
Monocytes Relative: 5 %
Neutro Abs: 9.4 10*3/uL — ABNORMAL HIGH (ref 1.7–7.7)
Neutrophils Relative %: 86 %
Platelets: 264 10*3/uL (ref 150–400)
RBC: 5.01 MIL/uL (ref 4.22–5.81)
RDW: 12.5 % (ref 11.5–15.5)
WBC: 11.1 10*3/uL — ABNORMAL HIGH (ref 4.0–10.5)
nRBC: 0 % (ref 0.0–0.2)

## 2021-02-14 LAB — GLUCOSE, CAPILLARY
Glucose-Capillary: 276 mg/dL — ABNORMAL HIGH (ref 70–99)
Glucose-Capillary: 273 mg/dL — ABNORMAL HIGH (ref 70–99)
Glucose-Capillary: 266 mg/dL — ABNORMAL HIGH (ref 70–99)
Glucose-Capillary: 274 mg/dL — ABNORMAL HIGH (ref 70–99)

## 2021-02-14 LAB — D-DIMER, QUANTITATIVE: D-Dimer, Quant: 1.87 ug/mL-FEU — ABNORMAL HIGH (ref 0.00–0.50)

## 2021-02-14 LAB — C-REACTIVE PROTEIN: CRP: 1.1 mg/dL — ABNORMAL HIGH (ref <1.0)

## 2021-02-14 LAB — MAGNESIUM: Magnesium: 1.9 mg/dL (ref 1.7–2.4)

## 2021-02-14 LAB — FERRITIN: Ferritin: 128 ng/mL (ref 24–336)

## 2021-02-14 MED ORDER — MELATONIN 5 MG PO TABS
5.0000 mg | ORAL_TABLET | Freq: Once | ORAL | Status: AC
  Administered 2021-02-14: 5 mg via ORAL
  Filled 2021-02-14: qty 1

## 2021-02-14 MED ORDER — INSULIN GLARGINE-YFGN 100 UNIT/ML ~~LOC~~ SOLN
10.0000 [IU] | Freq: Every day | SUBCUTANEOUS | Status: DC
  Administered 2021-02-14 – 2021-02-15 (×2): 10 [IU] via SUBCUTANEOUS
  Filled 2021-02-14 (×2): qty 0.1

## 2021-02-14 NOTE — Progress Notes (Signed)
Progress Note   Patient: Anthony [name]. DSK:[id number] DOB: [birth year] DOA: 02/12/2021     2 DOS: the patient was seen and examined on 02/14/2021   Brief hospital course: Mr. Kottke is a 71 yo male with PMH CAD, PAD, HLD, COPD, bipolar, DM II who presented with worsening dyspnea at home.  He resides at a skilled nursing facility and was recently diagnosed with [place] outpatient around approximately 02/03/2021.  Due to becoming hypoxic, he was sent to the ER for further evaluation. He also underwent left AKA on 09/21/2020 and has had recurrent falls since. He was positive for COVID-19 on evaluation in the ER as well as significantly hypoxic requiring nonrebreather.  He was started on remdesivir, steroids, and breathing treatments. He was also evaluated for trauma from his falls and found to have multiple rib fractures bilaterally.  Assessment and Plan: * Acute respiratory failure with hypoxia ([place])- (present on admission) - Chronic underlying lung disease.  We will need to clarify if he is on oxygen at baseline as well - CTA chest shows bilateral paraseptal emphysema with chronic ILD.  Also has multiple pulmonary nodules. -Etiology for worsening hypoxia felt due to exacerbation of underlying COPD as well as superimposed COVID infection - Down to [place]ter high flow this morning, turned down to 10 L while I was evaluating patient  COVID-19 virus infection - Reportedly diagnosed approximately 1 week prior to admission but unclear if he has been treated - Given worsening oxygen demands and several comorbidities, continue remdesivir -Continue steroids in setting of worsening hypoxia as well - Trend inflammatory markers  COPD (chronic obstructive pulmonary disease) (HCC)- (present on admission) - Some probable exacerbation in setting of underlying COVID infection - Continue steroids, breathing treatments - Encourage incentive spirometer and flutter valve  Hypokalemia - Replete as needed  Rib  fractures - Likely due to recurrent falls after his left AKA - CTA chest shows left 3rd - 5th and left 5th and 9th -Encourage incentive spirometer use when off of nonrebreather - Continue pain control and otherwise supportive care  HTN (hypertension) - BP meds on hold in setting of soft pressures on admission  History of DVT (deep vein thrombosis) - Continue Xarelto  Bipolar 1 disorder (HCC) - continue mirtazapine  Multiple lung nodules- (present on admission) - Appears to be known from 2020 - CTA chest shows 6 mm left lower lobe, 3 and 5 mm right middle lobe - appears stable/smaller from prior imagining - continue outpatient monitoring   DM2 (diabetes mellitus, type 2) (HCC) - A1c 7.7% on admission - Continue SSI and CBG monitoring - CBGs elevated some in setting of steroid use; adding semglee while on steroids, shouldn't need at discharge  Hyperlipidemia associated with type 2 diabetes mellitus ([place])- (present on admission) - Continue Lipitor  CAD (coronary artery disease)- (present on admission) - Continue aspirin, statin, Xarelto - hold BB in setting of mild bradycardia       Subjective: No events overnight.  Down to salter high flow this morning.  He appears more comfortable and is more awake/alert.  Using flutter valve bedside.  Physical Exam: Vitals:   02/14/21 0900 02/14/21 1000 02/14/21 1100 02/14/21 1122  BP: 129/66 109/64 129/72   Pulse: (!) 56 78 65   Resp: 11 15 (!) 23   Temp:    98.2 F (36.8 C)  TempSrc:    Axillary  SpO2: 91% (!) 84% (!) 87%   Weight:      Height:  Physical Exam Constitutional:      General: He is not in acute distress.    Comments: Elderly man laying in bed appearing fatigued but in NAD  HENT:     Head: Normocephalic and atraumatic.     Mouth/Throat:     Mouth: Mucous membranes are moist.  Eyes:     Extraocular Movements: Extraocular movements intact.  Cardiovascular:     Rate and Rhythm: Normal rate and regular  rhythm.     Heart sounds: Normal heart sounds.  Pulmonary:     Effort: Pulmonary effort is normal. No respiratory distress.     Comments: Bilateral coarse breath sounds Abdominal:     General: Bowel sounds are normal. There is no distension.     Palpations: Abdomen is soft.     Tenderness: There is no abdominal tenderness.  Musculoskeletal:        General: Normal range of motion.     Cervical back: Normal range of motion and neck supple.  Skin:    General: Skin is warm and dry.  Neurological:     General: No focal deficit present.  Psychiatric:        Mood and Affect: Mood normal.      Data Reviewed:  I have Reviewed nursing notes, Vitals, and Lab results since pt's last encounter. Pertinent lab results Glucose 252, creat 0.72, wbc 11.1  I have ordered test including BMP, Mg, CBC I have independently visualized and interpreted imaging CTA chest which showed chronic lung disease. I have reviewed the last note from all staff over past 24 hours,  I have discussed pt's care plan and test results with nursing staff.   Family Communication:   Disposition: Status is: Inpatient Remains inpatient appropriate because: covid treatment, respiratory failure     Planned Discharge Destination: Skilled nursing facility  DVT prophylaxis: Xarelto    Author: Dwyane Dee, MD 02/14/2021 1:48 PM  For on call review [email or web address].

## 2021-02-14 NOTE — TOC Benefit Eligibility Note (Signed)
Patient Teacher, English as a foreign language completed.    The patient is currently admitted and upon discharge could be taking Tradjenta 5 mg tablets.  The current 30 day co-pay is, $0.00.   The patient is insured through [place] [place] Patient Advocate Specialist [place] Patient Advocate Team Direct Number: [phone]  Fax: [phone]

## 2021-02-14 NOTE — Progress Notes (Signed)
PT demonstrated hands on understanding of Flutter device- NPC at this time. 

## 2021-02-14 NOTE — Plan of Care (Signed)
Patient has not slept all night.   Problem: Education: Goal: Knowledge of General Education information will improve Description: Including pain rating scale, medication(s)/side effects and non-pharmacologic comfort measures Outcome: Progressing   Problem: Health Behavior/Discharge Planning: Goal: Ability to manage health-related needs will improve Outcome: Progressing   Problem: Clinical Measurements: Goal: Ability to maintain clinical measurements within normal limits will improve Outcome: Progressing Goal: Will remain free from infection Outcome: Progressing Goal: Diagnostic test results will improve Outcome: Progressing Goal: Respiratory complications will improve Outcome: Progressing Goal: Cardiovascular complication will be avoided Outcome: Progressing   Problem: Activity: Goal: Risk for activity intolerance will decrease Outcome: Progressing   Problem: Nutrition: Goal: Adequate nutrition will be maintained Outcome: Progressing   Problem: Coping: Goal: Level of anxiety will decrease Outcome: Progressing   Problem: Elimination: Goal: Will not experience complications related to bowel motility Outcome: Progressing Goal: Will not experience complications related to urinary retention Outcome: Progressing   Problem: Pain Managment: Goal: General experience of comfort will improve Outcome: Progressing   Problem: Safety: Goal: Ability to remain free from injury will improve Outcome: Progressing   Problem: Skin Integrity: Goal: Risk for impaired skin integrity will decrease Outcome: Progressing

## 2021-02-15 DIAGNOSIS — J42 Unspecified chronic bronchitis: Secondary | ICD-10-CM

## 2021-02-15 DIAGNOSIS — E1100 Type 2 diabetes mellitus with hyperosmolarity without nonketotic hyperglycemic-hyperosmolar coma (NKHHC): Secondary | ICD-10-CM

## 2021-02-15 LAB — MAGNESIUM: Magnesium: 2.1 mg/dL (ref 1.7–2.4)

## 2021-02-15 LAB — COMPREHENSIVE METABOLIC PANEL
ALT: 17 U/L (ref 0–44)
AST: 12 U/L — ABNORMAL LOW (ref 15–41)
Albumin: 3.1 g/dL — ABNORMAL LOW (ref 3.5–5.0)
Alkaline Phosphatase: 47 U/L (ref 38–126)
Anion gap: 8 (ref 5–15)
BUN: 29 mg/dL — ABNORMAL HIGH (ref 8–23)
CO2: 27 mmol/L (ref 22–32)
Calcium: 8.8 mg/dL — ABNORMAL LOW (ref 8.9–10.3)
Chloride: 100 mmol/L (ref 98–111)
Creatinine, Ser: 0.72 mg/dL (ref 0.61–1.24)
GFR, Estimated: >60 mL/min (ref >60)
Glucose, Bld: 287 mg/dL — ABNORMAL HIGH (ref 70–99)
Potassium: 4.7 mmol/L (ref 3.5–5.1)
Sodium: 135 mmol/L (ref 135–145)
Total Bilirubin: 0.3 mg/dL (ref 0.3–1.2)
Total Protein: 6.6 g/dL (ref 6.5–8.1)

## 2021-02-15 LAB — GLUCOSE, CAPILLARY
Glucose-Capillary: 290 mg/dL — ABNORMAL HIGH (ref 70–99)
Glucose-Capillary: 315 mg/dL — ABNORMAL HIGH (ref 70–99)
Glucose-Capillary: 328 mg/dL — ABNORMAL HIGH (ref 70–99)
Glucose-Capillary: 322 mg/dL — ABNORMAL HIGH (ref 70–99)

## 2021-02-15 LAB — CBC WITH DIFFERENTIAL/PLATELET
Abs Immature Granulocytes: 0.18 10*3/uL — ABNORMAL HIGH (ref 0.00–0.07)
Basophils Absolute: 0 10*3/uL (ref 0.0–0.1)
Basophils Relative: 0 %
Eosinophils Absolute: 0 10*3/uL (ref 0.0–0.5)
Eosinophils Relative: 0 %
HCT: 46.6 % (ref 39.0–52.0)
Hemoglobin: 15.6 g/dL (ref 13.0–17.0)
Immature Granulocytes: 2 %
Lymphocytes Relative: 7 %
Lymphs Abs: 0.8 10*3/uL (ref 0.7–4.0)
MCH: 29.8 pg (ref 26.0–34.0)
MCHC: 33.5 g/dL (ref 30.0–36.0)
MCV: 88.9 fL (ref 80.0–100.0)
Monocytes Absolute: 0.4 10*3/uL (ref 0.1–1.0)
Monocytes Relative: 4 %
Neutro Abs: 9.5 10*3/uL — ABNORMAL HIGH (ref 1.7–7.7)
Neutrophils Relative %: 87 %
Platelets: 287 10*3/uL (ref 150–400)
RBC: 5.24 MIL/uL (ref 4.22–5.81)
RDW: 12.3 % (ref 11.5–15.5)
WBC: 10.9 10*3/uL — ABNORMAL HIGH (ref 4.0–10.5)
nRBC: 0 % (ref 0.0–0.2)

## 2021-02-15 LAB — D-DIMER, QUANTITATIVE: D-Dimer, Quant: 2.01 ug/mL-FEU — ABNORMAL HIGH (ref 0.00–0.50)

## 2021-02-15 LAB — FERRITIN: Ferritin: 111 ng/mL (ref 24–336)

## 2021-02-15 LAB — C-REACTIVE PROTEIN: CRP: 0.7 mg/dL (ref <1.0)

## 2021-02-15 MED ORDER — MELATONIN 5 MG PO TABS
5.0000 mg | ORAL_TABLET | Freq: Once | ORAL | Status: AC
Start: 2021-02-15 — End: 2021-02-15
  Administered 2021-02-15: 5 mg via ORAL
  Filled 2021-02-15: qty 1

## 2021-02-15 MED ORDER — INSULIN GLARGINE-YFGN 100 UNIT/ML ~~LOC~~ SOLN
15.0000 [IU] | Freq: Every day | SUBCUTANEOUS | Status: DC
  Filled 2021-02-15: qty 0.15

## 2021-02-15 MED ORDER — INSULIN ASPART 100 UNIT/ML IJ SOLN
3.0000 [IU] | Freq: Three times a day (TID) | INTRAMUSCULAR | Status: DC
  Administered 2021-02-15 – 2021-02-16 (×4): 3 [IU] via SUBCUTANEOUS

## 2021-02-15 MED ORDER — INSULIN GLARGINE-YFGN 100 UNIT/ML ~~LOC~~ SOLN
5.0000 [IU] | Freq: Once | SUBCUTANEOUS | Status: AC
  Administered 2021-02-15: 5 [IU] via SUBCUTANEOUS
  Filled 2021-02-15: qty 0.05

## 2021-02-15 MED ORDER — IPRATROPIUM-ALBUTEROL 0.5-2.5 (3) MG/3ML IN SOLN
3.0000 mL | Freq: Three times a day (TID) | RESPIRATORY_TRACT | Status: DC
  Administered 2021-02-16 – 2021-03-03 (×44): 3 mL via RESPIRATORY_TRACT
  Filled 2021-02-15 (×46): qty 3

## 2021-02-15 NOTE — Progress Notes (Signed)
[facility]                                                                               Anthony [name], is a 71 y.o. male, DOB - 04/29/1950, TDV:[id number] Admit date - 02/12/2021    Outpatient Primary MD for the patient is Anthony Nutting, DO  LOS - 3  days    Brief summary   Mr. Anthony [name] is a 71 yo male with PMH CAD, PAD, HLD, COPD, bipolar, DM II who presented with worsening dyspnea at home.  He resides at a skilled nursing facility and was recently diagnosed with [place] outpatient around approximately 02/03/2021.  Due to becoming hypoxic, he was sent to the ER for further evaluation. He also underwent left AKA on 09/21/2020 and has had recurrent falls since. He was positive for COVID-19 on evaluation in the ER as well as significantly hypoxic requiring nonrebreather.  He was started on remdesivir, steroids, and breathing treatments. He was also evaluated for trauma from his falls and found to have multiple rib fractures bilaterally.   Assessment & Plan    Assessment and Plan: * Acute respiratory failure with hypoxia ([place])- (present on admission) - Chronic underlying lung disease, states he is on O2 at home, does not remember how much - CTA chest shows bilateral paraseptal emphysema with chronic ILD.  Also has multiple pulmonary nodules. -Etiology for worsening hypoxia felt due to exacerbation of underlying COPD as well as superimposed COVID infection - O2 sats 89 to 92% at the time of my examination, on 13 L O2 via HFNC.  Wean as tolerated, continue I-S, flutter valve  HTN (hypertension) - BP meds on hold, currently stable, will gradually resume  History of DVT (deep vein thrombosis) - Continue Xarelto  Rib fractures - Likely due to recurrent falls after his left AKA - CTA chest shows left 3rd - 5th and left 5th and 9th -Continue I-S, flutter valve   Bipolar 1 disorder (HCC) - continue mirtazapine, no acute issues  Hypokalemia - Replete PRN  COVID-19 virus  infection - Reportedly diagnosed approximately 1 week prior to admission but unclear if he has been treated - Given worsening oxygen demands and several comorbidities, continue remdesivir, day #4/5  -Continue IV Solu-Medrol, wean O2 as tolerated  Multiple lung nodules- (present on admission) - Appears to be known from 2020 - CTA chest shows 6 mm left lower lobe, 3 and 5 mm right middle lobe - appears stable/smaller from prior imagining, continue outpatient monitoring   DM2 (diabetes mellitus, type 2) (HCC) - A1c 7.7% on admission - CBGs elevated likely due to steroids -Increase basal insulin to 15 units daily, added NovoLog meal coverage 3 units TID AC, continue sliding scale insulin (resistant)   COPD (chronic obstructive pulmonary disease) (HCC)- (present on admission) - Likely has mild exacerbation in the setting of ongoing COVID infection  - Continue scheduled nebs, on IV Solu-Medrol 80 mg every 12 hours  - Continue incentive spirometry, flutter valve, wean O2 as tolerated   Hyperlipidemia associated with type 2 diabetes mellitus ([place])- (present on admission) - Continue Lipitor  CAD (coronary artery disease)- (present on admission) - Continue aspirin, statin, Xarelto -Continue  to hold beta-blocker, mild bradycardia     Pressure Injury  Pressure Injury 02/12/21 Buttocks Medial Stage 2 -  Partial thickness loss of dermis presenting as a shallow open injury with a red, pink wound bed without slough. (Active)  02/12/21 1830  Location: Buttocks  Location Orientation: Medial  Staging: Stage 2 -  Partial thickness loss of dermis presenting as a shallow open injury with a red, pink wound bed without slough.  Wound Description (Comments):   Present on Admission: Yes     Code Status: Full CODE STATUS DVT Prophylaxis:   rivaroxaban (XARELTO) tablet 20 mg   Level of Care: Level of care: Stepdown Family Communication:    Disposition Plan:     Status is: Inpatient Remains  inpatient appropriate because: Not appropriate for discharge.  Still on 13 L HFNC, wean O2 as tolerated start PT today  Time Spent in minutes   35 minutes  Procedures:  None  Consultants:   None  Antimicrobials:   Anti-infectives (From admission, onward)    Start     Dose/Rate Route Frequency Ordered Stop   02/13/21 1000  remdesivir 100 mg in sodium chloride 0.9 % 100 mL IVPB       See Hyperspace for full Linked Orders Report.   100 mg 200 mL/hr over 30 Minutes Intravenous Daily 02/12/21 1742 02/17/21 0959   02/12/21 1845  remdesivir 200 mg in sodium chloride 0.9% 250 mL IVPB       See Hyperspace for full Linked Orders Report.   200 mg 580 mL/hr over 30 Minutes Intravenous Once 02/12/21 1742 02/12/21 2046        Medications  Scheduled Meds:  vitamin C  500 mg Oral Daily   aspirin EC  81 mg Oral Daily   atorvastatin  40 mg Oral Daily   budesonide (PULMICORT) nebulizer solution  0.25 mg Nebulization BID   Chlorhexidine Gluconate Cloth  6 each Topical Q0600   feeding supplement  237 mL Oral BID BM   feeding supplement (GLUCERNA SHAKE)  237 mL Oral BID BM   gabapentin  100 mg Oral BID   insulin aspart  0-20 Units Subcutaneous TID WC   insulin aspart  0-5 Units Subcutaneous QHS   insulin aspart  3 Units Subcutaneous TID WC   [START ON 02/16/2021] insulin glargine-yfgn  15 Units Subcutaneous QHS   insulin glargine-yfgn  5 Units Subcutaneous Once   ipratropium-albuterol  3 mL Nebulization Q6H   mouth rinse  15 mL Mouth Rinse BID   methylPREDNISolone (SOLU-MEDROL) injection  80 mg Intravenous Q12H   mirtazapine  30 mg Oral QHS   multivitamin with minerals  1 tablet Oral Daily   potassium chloride  40 mEq Oral Daily   Ensure Max Protein  11 oz Oral Daily   rivaroxaban  20 mg Oral Q supper   zinc sulfate  220 mg Oral Daily   Continuous Infusions:  sodium chloride Stopped (02/14/21 1117)   remdesivir 100 mg in NS 100 mL 100 mg (02/15/21 0938)   PRN Meds:.sodium chloride,  acetaminophen **OR** acetaminophen, chlorpheniramine-HYDROcodone, guaiFENesin-dextromethorphan    Subjective:   Advay Volante was seen and examined today.  Still hypoxic, on 13 L O2 via HFNC at the time of my examination.  No fevers overnight.  Still feels generalized weakness, pain due to rib fracture.  No acute events overnight.  No acute events overnight.    Objective:   Vitals:   02/15/21 0700 02/15/21 0800 02/15/21 0838 02/15/21 0900  BP: Marland Kitchen)  146/67 (!) 140/95  (!) 127/50  Pulse: (!) 51 (!) 45  61  Resp: 20 15  (!) 21  Temp:    (!) 97.5 F (36.4 C)  TempSrc:    Axillary  SpO2: (!) 87% (!) 87% (!) 86% (!) 87%  Weight:      Height:        Intake/Output Summary (Last 24 hours) at 02/15/2021 1010 Last data filed at 02/15/2021 0800 Gross per 24 hour  Intake 624.38 ml  Output 3850 ml  Net -3225.62 ml   Filed Weights   02/12/21 1823  Weight: 87.2 kg     Exam General: Alert and oriented x 3, NAD Cardiovascular: S1 S2 auscultated, no murmurs, RRR Respiratory: Coarse breath sounds bilaterally Gastrointestinal: Soft, nontender, nondistended, + bowel sounds Ext: left AKA Neuro: no new deficits Psych: Normal affect and demeanor, alert and oriented x3    Data Reviewed:  I have personally reviewed following labs and imaging studies   CBC Lab Results  Component Value Date   WBC 10.9 (H) 02/15/2021   RBC 5.24 02/15/2021   HGB 15.6 02/15/2021   HCT 46.6 02/15/2021   MCV 88.9 02/15/2021   MCH 29.8 02/15/2021   PLT 287 02/15/2021   MCHC 33.5 02/15/2021   RDW 12.3 02/15/2021   LYMPHSABS 0.8 02/15/2021   MONOABS 0.4 02/15/2021   EOSABS 0.0 02/15/2021   BASOSABS 0.0 86/76/1950     Last metabolic panel Lab Results  Component Value Date   NA 135 02/15/2021   K 4.7 02/15/2021   CL 100 02/15/2021   CO2 27 02/15/2021   BUN 29 (H) 02/15/2021   CREATININE 0.72 02/15/2021   GLUCOSE 287 (H) 02/15/2021   GFRNONAA >60 02/15/2021   GFRAA 84 10/30/2019   CALCIUM 8.8 (L)  02/15/2021   PROT 6.6 02/15/2021   ALBUMIN 3.1 (L) 02/15/2021   LABGLOB 2.9 07/02/2018   AGRATIO 1.6 07/02/2018   BILITOT 0.3 02/15/2021   ALKPHOS 47 02/15/2021   AST 12 (L) 02/15/2021   ALT 17 02/15/2021   ANIONGAP 8 02/15/2021    CBG (last 3)  Recent Labs    02/14/21 1658 02/14/21 2118 02/15/21 0812  GLUCAP 266* 274* 290*     GFR: Estimated Creatinine Clearance: 91.3 mL/min (by C-G formula based on SCr of 0.72 mg/dL).   Recent Results (from the past 240 hour(s))  Resp Panel by RT-PCR (Flu A&B, Covid) Nasopharyngeal Swab     Status: Abnormal   Collection Time: 02/12/21 12:20 PM   Specimen: Nasopharyngeal Swab; Nasopharyngeal(NP) swabs in vial transport medium  Result Value Ref Range Status   SARS Coronavirus 2 by RT PCR POSITIVE (A) NEGATIVE Final    Comment: (NOTE) SARS-CoV-2 target nucleic acids are DETECTED.  The SARS-CoV-2 RNA is generally detectable in upper respiratory specimens during the acute phase of infection. Positive results are indicative of the presence of the identified virus, but do not rule out bacterial infection or co-infection with other pathogens not detected by the test. Clinical correlation with patient history and other diagnostic information is necessary to determine patient infection status. The expected result is Negative.  Fact Sheet for Patients: EntrepreneurPulse.com.au  Fact Sheet for Healthcare Providers: IncredibleEmployment.be  This test is not yet approved or cleared by the [place] FDA and  has been authorized for detection and/or diagnosis of SARS-CoV-2 by FDA under an Emergency Use Authorization (EUA).  This EUA will remain in effect (meaning this test can be used) for the duration of  the  COVID-19 declaration under Section 564(b)(1) of the A ct, 21 U.S.C. section 360bbb-3(b)(1), unless the authorization is terminated or revoked sooner.     Influenza A by PCR NEGATIVE NEGATIVE  Final   Influenza B by PCR NEGATIVE NEGATIVE Final    Comment: (NOTE) The Xpert Xpress SARS-CoV-2/FLU/RSV plus assay is intended as an aid in the diagnosis of influenza from Nasopharyngeal swab specimens and should not be used as a sole basis for treatment. Nasal washings and aspirates are unacceptable for Xpert Xpress SARS-CoV-2/FLU/RSV testing.  Fact Sheet for Patients: EntrepreneurPulse.com.au  Fact Sheet for Healthcare Providers: IncredibleEmployment.be  This test is not yet approved or cleared by the [place] FDA and has been authorized for detection and/or diagnosis of SARS-CoV-2 by FDA under an Emergency Use Authorization (EUA). This EUA will remain in effect (meaning this test can be used) for the duration of the COVID-19 declaration under Section 564(b)(1) of the Act, 21 U.S.C. section 360bbb-3(b)(1), unless the authorization is terminated or revoked.  Performed at [facility], [place] [address] [place], [place] [address]   MRSA Next Gen by PCR, Nasal     Status: None   Collection Time: 02/12/21  6:30 PM   Specimen: Nasal Mucosa; Nasal Swab  Result Value Ref Range Status   MRSA by PCR Next Gen NOT DETECTED NOT DETECTED Final    Comment: (NOTE) The GeneXpert MRSA Assay (FDA approved for NASAL specimens only), is one component of a comprehensive MRSA colonization surveillance program. It is not intended to diagnose MRSA infection nor to guide or monitor treatment for MRSA infections. Test performance is not FDA approved in patients less than 65 years old. Performed at [facility], [place] [address], [place], [place] [address]         Radiology Studies: No results found.     Estill Cotta M.D. [facility] 02/15/2021, 10:10 AM  Available via Epic secure chat 7am-7pm After 7 pm, please refer to night coverage provider listed on amion.

## 2021-02-16 ENCOUNTER — Inpatient Hospital Stay (HOSPITAL_COMMUNITY): Payer: Medicare Other

## 2021-02-16 LAB — COMPREHENSIVE METABOLIC PANEL
ALT: 20 U/L (ref 0–44)
AST: 14 U/L — ABNORMAL LOW (ref 15–41)
Albumin: 3 g/dL — ABNORMAL LOW (ref 3.5–5.0)
Alkaline Phosphatase: 46 U/L (ref 38–126)
Anion gap: 6 (ref 5–15)
BUN: 28 mg/dL — ABNORMAL HIGH (ref 8–23)
CO2: 29 mmol/L (ref 22–32)
Calcium: 8.7 mg/dL — ABNORMAL LOW (ref 8.9–10.3)
Chloride: 101 mmol/L (ref 98–111)
Creatinine, Ser: 0.73 mg/dL (ref 0.61–1.24)
GFR, Estimated: >60 mL/min (ref >60)
Glucose, Bld: 291 mg/dL — ABNORMAL HIGH (ref 70–99)
Potassium: 4.9 mmol/L (ref 3.5–5.1)
Sodium: 136 mmol/L (ref 135–145)
Total Bilirubin: 0.3 mg/dL (ref 0.3–1.2)
Total Protein: 6.2 g/dL — ABNORMAL LOW (ref 6.5–8.1)

## 2021-02-16 LAB — CBC WITH DIFFERENTIAL/PLATELET
Abs Immature Granulocytes: 0.33 10*3/uL — ABNORMAL HIGH (ref 0.00–0.07)
Basophils Absolute: 0.1 10*3/uL (ref 0.0–0.1)
Basophils Relative: 0 %
Eosinophils Absolute: 0 10*3/uL (ref 0.0–0.5)
Eosinophils Relative: 0 %
HCT: 47.3 % (ref 39.0–52.0)
Hemoglobin: 15.8 g/dL (ref 13.0–17.0)
Immature Granulocytes: 3 %
Lymphocytes Relative: 6 %
Lymphs Abs: 0.8 10*3/uL (ref 0.7–4.0)
MCH: 29.9 pg (ref 26.0–34.0)
MCHC: 33.4 g/dL (ref 30.0–36.0)
MCV: 89.4 fL (ref 80.0–100.0)
Monocytes Absolute: 0.6 10*3/uL (ref 0.1–1.0)
Monocytes Relative: 5 %
Neutro Abs: 11.6 10*3/uL — ABNORMAL HIGH (ref 1.7–7.7)
Neutrophils Relative %: 86 %
Platelets: 309 10*3/uL (ref 150–400)
RBC: 5.29 MIL/uL (ref 4.22–5.81)
RDW: 12.2 % (ref 11.5–15.5)
WBC: 13.4 10*3/uL — ABNORMAL HIGH (ref 4.0–10.5)
nRBC: 0 % (ref 0.0–0.2)

## 2021-02-16 LAB — GLUCOSE, CAPILLARY
Glucose-Capillary: 287 mg/dL — ABNORMAL HIGH (ref 70–99)
Glucose-Capillary: 252 mg/dL — ABNORMAL HIGH (ref 70–99)
Glucose-Capillary: 339 mg/dL — ABNORMAL HIGH (ref 70–99)
Glucose-Capillary: 275 mg/dL — ABNORMAL HIGH (ref 70–99)

## 2021-02-16 LAB — MAGNESIUM: Magnesium: 2.1 mg/dL (ref 1.7–2.4)

## 2021-02-16 LAB — D-DIMER, QUANTITATIVE: D-Dimer, Quant: 1.85 ug/mL-FEU — ABNORMAL HIGH (ref 0.00–0.50)

## 2021-02-16 LAB — BRAIN NATRIURETIC PEPTIDE: B Natriuretic Peptide: 101.5 pg/mL — ABNORMAL HIGH (ref 0.0–100.0)

## 2021-02-16 LAB — C-REACTIVE PROTEIN: CRP: 0.5 mg/dL (ref <1.0)

## 2021-02-16 LAB — PROCALCITONIN: Procalcitonin: <0.1 ng/mL

## 2021-02-16 LAB — FERRITIN: Ferritin: 95 ng/mL (ref 24–336)

## 2021-02-16 MED ORDER — FUROSEMIDE 10 MG/ML IJ SOLN
20.0000 mg | Freq: Once | INTRAMUSCULAR | Status: AC
Start: 2021-02-16 — End: 2021-02-16
  Administered 2021-02-16: 20 mg via INTRAVENOUS
  Filled 2021-02-16: qty 2

## 2021-02-16 MED ORDER — INSULIN GLARGINE-YFGN 100 UNIT/ML ~~LOC~~ SOLN
18.0000 [IU] | Freq: Every day | SUBCUTANEOUS | Status: DC
  Administered 2021-02-16: 18 [IU] via SUBCUTANEOUS
  Filled 2021-02-16 (×2): qty 0.18

## 2021-02-16 MED ORDER — INSULIN ASPART 100 UNIT/ML IJ SOLN
5.0000 [IU] | Freq: Three times a day (TID) | INTRAMUSCULAR | Status: DC
  Administered 2021-02-16 – 2021-02-17 (×3): 5 [IU] via SUBCUTANEOUS

## 2021-02-16 NOTE — Progress Notes (Signed)
NUTRITION NOTE  Patient seen by this RD on 2/6. RD ordered Glucerna Shake BID and Ensure Max once/day. Since that time, Ensure Enlive was added BID.  Secure chat message received from RN about patient having multiple oral nutrition supplement orders. He shared that patient ate very well for breakfast this AM.   Will maintain Glucerna and Ensure Max orders and d/c Ensure Enlive order.     Jarome Matin, MS, RD, LDN Inpatient Clinical Dietitian RD pager # available in [place]  After hours/weekend pager # available in [facility]

## 2021-02-16 NOTE — Progress Notes (Signed)
[facility]                                                                               [name], is a 71 y.o. male, DOB - [birth year], UXN:[id number] Admit date - 02/12/2021    Outpatient Primary MD for the patient is Anthony Nutting, DO  LOS - 4  days    Brief summary   Anthony [name] is a 71 yo male with PMH CAD, PAD, HLD, COPD, bipolar, DM II who presented with worsening dyspnea at home.  He resides at a skilled nursing facility and was recently diagnosed with [place] outpatient around approximately 02/03/2021.  Due to becoming hypoxic, he was sent to the ER for further evaluation. He also underwent left AKA on 09/21/2020 and has had recurrent falls since. He was positive for COVID-19 on evaluation in the ER as well as significantly hypoxic requiring nonrebreather.  He was started on remdesivir, steroids, and breathing treatments. He was also evaluated for trauma from his falls and found to have multiple rib fractures bilaterally.   Assessment & Plan    Assessment and Plan: * Acute respiratory failure with hypoxia ([place])- (present on admission) - Chronic underlying lung disease, states he is on O2 at home. CTA chest shows bilateral paraseptal emphysema with chronic ILD.  Also has multiple pulmonary nodules.  No PE. -Etiology for worsening hypoxia felt due to exacerbation of underlying COPD as well as superimposed COVID infection - Continues to have persistent hypoxia, O2 sats 82% lowest during examination on 15 L O2 via HFNC -BNP mildly elevated 101.5, will give Lasix 20 mg IV x1.  D-dimer trending down, on anticoagulation hence PE less likely -Procalcitonin less than 0.1 -Chest x-ray showed new moderate elevation of right hemidiaphragm, chronic bilateral interstitial thickening, likely scarring.  If no significant improvement in hypoxia, will consult pulmonology.  HTN (hypertension) - BP meds on hold, currently stable, will gradually resume  History of DVT (deep vein  thrombosis) - Continue Xarelto  Rib fractures - Likely due to recurrent falls after his left AKA - CTA chest shows left 3rd - 5th and left 5th and 9th -Continue I-S, flutter valve   Bipolar 1 disorder (HCC) - continue mirtazapine, no acute issues  Hypokalemia - Replete PRN  COVID-19 virus infection - Reportedly diagnosed approximately 1 week prior to admission but unclear if he has been treated - Given worsening oxygen demands and several comorbidities, completing remdesivir today 5/5 -Persistent hypoxia, continue IV Solu-Medrol, wean O2 as tolerated, incentive spirometry, nebs.  - Lasix 20 mg IV x1   Multiple lung nodules- (present on admission) - Appears to be known from 2020 - CTA chest shows 6 mm left lower lobe, 3 and 5 mm right middle lobe - appears stable/smaller from prior imagining, continue outpatient monitoring   DM2 (diabetes mellitus, type 2) (HCC) - A1c 7.7% on admission - CBGs elevated likely due to steroids -Continues to have persistent hypoglycemia, increase basal insulin to 18 units qhs, increase NovoLog meal coverage  5 units TID AC, continue sliding scale insulin    COPD (chronic obstructive pulmonary disease) ([place])- (present on admission) - Likely has mild exacerbation in the setting of  ongoing COVID infection  - Continue incentive spirometry, flutter valve and wean O2 as tolerated -Given persistent hypoxia, continue IV Solu-Medrol 80 mg every 12 hours, scheduled nebs - will consult pulmonology if no significant improvement in hypoxia  Hyperlipidemia associated with type 2 diabetes mellitus ([place])- (present on admission) - Continue Lipitor  CAD (coronary artery disease)- (present on admission) - Continue aspirin, statin, Xarelto -Continue to hold beta-blocker, mild bradycardia     Pressure Injury  Pressure Injury 02/12/21 Buttocks Medial Stage 2 -  Partial thickness loss of dermis presenting as a shallow open injury with a red, pink wound bed  without slough. (Active)  02/12/21 1830  Location: Buttocks  Location Orientation: Medial  Staging: Stage 2 -  Partial thickness loss of dermis presenting as a shallow open injury with a red, pink wound bed without slough.  Wound Description (Comments):   Present on Admission: Yes     Code Status: Full CODE STATUS DVT Prophylaxis:   rivaroxaban (XARELTO) tablet 20 mg   Level of Care: Level of care: Stepdown Family Communication:    Disposition Plan:     Status is: Inpatient Remains inpatient appropriate because: Not appropriate for discharge.  Persistent hypoxia on 15 L HFNC    Procedures:  None  Consultants:   None  Antimicrobials:   Anti-infectives (From admission, onward)    Start     Dose/Rate Route Frequency Ordered Stop   02/13/21 1000  remdesivir 100 mg in sodium chloride 0.9 % 100 mL IVPB       See Hyperspace for full Linked Orders Report.   100 mg 200 mL/hr over 30 Minutes Intravenous Daily 02/12/21 1742 02/16/21 1153   02/12/21 1845  remdesivir 200 mg in sodium chloride 0.9% 250 mL IVPB       See Hyperspace for full Linked Orders Report.   200 mg 580 mL/hr over 30 Minutes Intravenous Once 02/12/21 1742 02/12/21 2046        Medications  Scheduled Meds:  vitamin C  500 mg Oral Daily   aspirin EC  81 mg Oral Daily   atorvastatin  40 mg Oral Daily   budesonide (PULMICORT) nebulizer solution  0.25 mg Nebulization BID   Chlorhexidine Gluconate Cloth  6 each Topical Q0600   feeding supplement  237 mL Oral BID BM   feeding supplement (GLUCERNA SHAKE)  237 mL Oral BID BM   furosemide  20 mg Intravenous Once   gabapentin  100 mg Oral BID   insulin aspart  0-20 Units Subcutaneous TID WC   insulin aspart  0-5 Units Subcutaneous QHS   insulin aspart  5 Units Subcutaneous TID WC   insulin glargine-yfgn  18 Units Subcutaneous QHS   ipratropium-albuterol  3 mL Nebulization TID   mouth rinse  15 mL Mouth Rinse BID   methylPREDNISolone (SOLU-MEDROL) injection   80 mg Intravenous Q12H   mirtazapine  30 mg Oral QHS   multivitamin with minerals  1 tablet Oral Daily   potassium chloride  40 mEq Oral Daily   Ensure Max Protein  11 oz Oral Daily   rivaroxaban  20 mg Oral Q supper   zinc sulfate  220 mg Oral Daily   Continuous Infusions:  sodium chloride 10 mL/hr at 02/15/21 1833   PRN Meds:.sodium chloride, acetaminophen **OR** acetaminophen, chlorpheniramine-HYDROcodone, guaiFENesin-dextromethorphan    Subjective:   Sly Parlee was seen and examined today.  Still hypoxia, O2 sats in 80s on 15 L HFNC.  No chest pain.  Has generalized weakness.  No acute events overnight.  No fevers.  Objective:   Vitals:   02/16/21 0845 02/16/21 0900 02/16/21 1000 02/16/21 1100  BP:  138/61 121/66 (!) 150/78  Pulse:  (!) 44 (!) 53 80  Resp:  16 19   Temp:  97.7 F (36.5 C)    TempSrc:  Axillary    SpO2: 90% 95% 93% (!) 89%  Weight:      Height:        Intake/Output Summary (Last 24 hours) at 02/16/2021 1307 Last data filed at 02/16/2021 0900 Gross per 24 hour  Intake 679.63 ml  Output 2500 ml  Net -1820.37 ml   Filed Weights   02/12/21 1823  Weight: 87.2 kg   Physical Exam General: Alert and oriented x 3, NAD Cardiovascular: S1 S2 clear, RRR. No pedal edema b/l Respiratory: Scattered coarse breath sounds bilaterally Gastrointestinal: Soft, nontender, nondistended, NBS Ext: left AKA Neuro: no new deficits Skin: No rashes Psych: Normal affect and demeanor, alert and oriented x3      Data Reviewed:  I have personally reviewed following labs and imaging studies   CBC Lab Results  Component Value Date   WBC 13.4 (H) 02/16/2021   RBC 5.29 02/16/2021   HGB 15.8 02/16/2021   HCT 47.3 02/16/2021   MCV 89.4 02/16/2021   MCH 29.9 02/16/2021   PLT 309 02/16/2021   MCHC 33.4 02/16/2021   RDW 12.2 02/16/2021   LYMPHSABS 0.8 02/16/2021   MONOABS 0.6 02/16/2021   EOSABS 0.0 02/16/2021   BASOSABS 0.1 09/32/6712     Last metabolic  panel Lab Results  Component Value Date   NA 136 02/16/2021   K 4.9 02/16/2021   CL 101 02/16/2021   CO2 29 02/16/2021   BUN 28 (H) 02/16/2021   CREATININE 0.73 02/16/2021   GLUCOSE 291 (H) 02/16/2021   GFRNONAA >60 02/16/2021   GFRAA 84 10/30/2019   CALCIUM 8.7 (L) 02/16/2021   PROT 6.2 (L) 02/16/2021   ALBUMIN 3.0 (L) 02/16/2021   LABGLOB 2.9 07/02/2018   AGRATIO 1.6 07/02/2018   BILITOT 0.3 02/16/2021   ALKPHOS 46 02/16/2021   AST 14 (L) 02/16/2021   ALT 20 02/16/2021   ANIONGAP 6 02/16/2021    CBG (last 3)  Recent Labs    02/15/21 1959 02/16/21 0824 02/16/21 1159  GLUCAP 322* 287* 252*     GFR: Estimated Creatinine Clearance: 91.3 mL/min (by C-G formula based on SCr of 0.73 mg/dL).   Recent Results (from the past 240 hour(s))  Resp Panel by RT-PCR (Flu A&B, Covid) Nasopharyngeal Swab     Status: Abnormal   Collection Time: 02/12/21 12:20 PM   Specimen: Nasopharyngeal Swab; Nasopharyngeal(NP) swabs in vial transport medium  Result Value Ref Range Status   SARS Coronavirus 2 by RT PCR POSITIVE (A) NEGATIVE Final    Comment: (NOTE) SARS-CoV-2 target nucleic acids are DETECTED.  The SARS-CoV-2 RNA is generally detectable in upper respiratory specimens during the acute phase of infection. Positive results are indicative of the presence of the identified virus, but do not rule out bacterial infection or co-infection with other pathogens not detected by the test. Clinical correlation with patient history and other diagnostic information is necessary to determine patient infection status. The expected result is Negative.  Fact Sheet for Patients: EntrepreneurPulse.com.au  Fact Sheet for Healthcare Providers: IncredibleEmployment.be  This test is not yet approved or cleared by the [place] FDA and  has been authorized for detection and/or diagnosis of SARS-CoV-2 by FDA under  an Emergency Use Authorization (EUA).  This  EUA will remain in effect (meaning this test can be used) for the duration of  the COVID-19 declaration under Section 564(b)(1) of the A ct, 21 U.S.C. section 360bbb-3(b)(1), unless the authorization is terminated or revoked sooner.     Influenza A by PCR NEGATIVE NEGATIVE Final   Influenza B by PCR NEGATIVE NEGATIVE Final    Comment: (NOTE) The Xpert Xpress SARS-CoV-2/FLU/RSV plus assay is intended as an aid in the diagnosis of influenza from Nasopharyngeal swab specimens and should not be used as a sole basis for treatment. Nasal washings and aspirates are unacceptable for Xpert Xpress SARS-CoV-2/FLU/RSV testing.  Fact Sheet for Patients: EntrepreneurPulse.com.au  Fact Sheet for Healthcare Providers: IncredibleEmployment.be  This test is not yet approved or cleared by the [place] FDA and has been authorized for detection and/or diagnosis of SARS-CoV-2 by FDA under an Emergency Use Authorization (EUA). This EUA will remain in effect (meaning this test can be used) for the duration of the COVID-19 declaration under Section 564(b)(1) of the Act, 21 U.S.C. section 360bbb-3(b)(1), unless the authorization is terminated or revoked.  Performed at [facility], [place] [address], [place], [place] [address]   MRSA Next Gen by PCR, Nasal     Status: None   Collection Time: 02/12/21  6:30 PM   Specimen: Nasal Mucosa; Nasal Swab  Result Value Ref Range Status   MRSA by PCR Next Gen NOT DETECTED NOT DETECTED Final    Comment: (NOTE) The GeneXpert MRSA Assay (FDA approved for NASAL specimens only), is one component of a comprehensive MRSA colonization surveillance program. It is not intended to diagnose MRSA infection nor to guide or monitor treatment for MRSA infections. Test performance is not FDA approved in patients less than 13 years old. Performed at [facility], [place] [address], [place],   [address]         Radiology Studies: DG CHEST PORT 1 VIEW  Result Date: 02/16/2021 CLINICAL DATA:  Acute respiratory failure with hypoxia. EXAM: PORTABLE CHEST 1 VIEW COMPARISON:  AP chest 02/12/2021, chest two views 09/08/2018 FINDINGS: Cardiac silhouette is again at the upper limits of normal size. Mediastinal contours are within normal limits with calcification again seen within the aortic arch. There is increased now moderate right hemidiaphragm elevation with decreased right lung volumes. Right basilar horizontal linear lung markings, likely a combination of scarring and subsegmental atelectasis. Diffuse bilateral mild interstitial thickening is similar to prior. No definite pleural effusion. No pneumothorax. No acute skeletal abnormality. IMPRESSION: New moderate elevation of the right hemidiaphragm. Chronic bilateral interstitial thickening, likely scarring. There appears to be subsegmental atelectasis within the right lung. Unchanged greater aeration of the left lung. Electronically Signed   By: Yvonne Kendall M.D.   On: 02/16/2021 08:27       Marshon Bangs M.D. [facility] 02/16/2021, 1:07 PM  Available via Epic secure chat 7am-7pm After 7 pm, please refer to night coverage provider listed on amion.

## 2021-02-16 NOTE — Progress Notes (Signed)
Inpatient Diabetes Program Recommendations  AACE/ADA: New Consensus Statement on Inpatient Glycemic Control (2015)  Target Ranges:  Prepandial:   less than 140 mg/dL      Peak postprandial:   less than 180 mg/dL (1-2 hours)      Critically ill patients:  140 - 180 mg/dL   Lab Results  Component Value Date   GLUCAP 252 (H) 02/16/2021   HGBA1C 7.7 (H) 02/12/2021    Review of Glycemic Control  291, 287, 252 mg/dL thus far today  On Solumedrol 80 Q12H  Inpatient Diabetes Program Recommendations:    Increase Semglee to 18 units QHS Increase Novolog to 5 units TID with meals  Continue to follow glucose trends.  Thank you. Lorenda Peck, RD, LDN, CDE Inpatient Diabetes Coordinator [phone]

## 2021-02-16 NOTE — Evaluation (Signed)
Physical Therapy Evaluation Patient Details Name: Anthony [name]. MRN: [id number] DOB: 12-24-1950 Today's Date: 02/16/2021  History of Present Illness  Patient is a 71 year old male who resides at [facility] and admitted for acute respiratory failure 2* COVID. PMH includes L AKA, hx falls.    Clinical Impression  Anthony [name]. is 71 y.o. male admitted with above HPI and diagnosis. Patient is currently limited by functional impairments below (see PT problem list). Patient is a resident at [facility] and per pt report has been transferring bed<>wheelchair at facility since Lt AKA in 09/2020. Today pt required Mod +2 assist to stand with RW and demonstrated good effort for Rt single leg stand. He was able to take small hops to move bed>chair with RW and mod+2 assist. Patient will benefit from continued skilled PT interventions to address impairments and progress independence with mobility, recommending follow up rehab at [facility] when pt returns to [facility]. Acute PT will follow and progress as able.        Recommendations for follow up therapy are one component of a multi-disciplinary discharge planning process, led by the attending physician.  Recommendations may be updated based on patient status, additional functional criteria and insurance authorization.  Follow Up Recommendations Skilled nursing-short term rehab (<3 hours/day)    Assistance Recommended at Discharge Frequent or constant Supervision/Assistance  Patient can return home with the following       Equipment Recommendations None recommended by PT  Recommendations for Other Services       Functional Status Assessment Patient has had a recent decline in their functional status and demonstrates the ability to make significant improvements in function in a reasonable and predictable amount of time.     Precautions / Restrictions Precautions Precautions: Fall Precaution Comments: multiple B rib fractures, Lt  AKA Restrictions Weight Bearing Restrictions: No      Mobility  Bed Mobility Overal bed mobility: Needs Assistance Bed Mobility: Supine to Sit     Supine to sit: Min guard, HOB elevated     General bed mobility comments: pt taking extra time, HOB elevated, and using bed rail.    Transfers Overall transfer level: Needs assistance Equipment used: Rolling walker (2 wheels) Transfers: Sit to/from Stand, Bed to chair/wheelchair/BSC Sit to Stand: Mod assist, +2 safety/equipment Stand pivot transfers: Mod assist, +2 safety/equipment, +2 physical assistance         General transfer comment: Mod +2 for power up to RW from EOB. pt with good awarenss of weight shift to Rt due to Lt AKA. pt performed small hops to Rt with RW and Mod +2 assist to steady balance.    Ambulation/Gait                  Stairs            Wheelchair Mobility    Modified Rankin (Stroke Patients Only)       Balance Overall balance assessment: History of Falls, Needs assistance Sitting-balance support: Feet supported Sitting balance-Leahy Scale: Fair     Standing balance support: Bilateral upper extremity supported, Reliant on assistive device for balance Standing balance-Leahy Scale: Poor                               Pertinent Vitals/Pain Pain Assessment Pain Assessment: No/denies pain    Home Living Family/patient expects to be discharged to:: Skilled nursing facility  Home Equipment: Conservation officer, nature (2 wheels);Wheelchair - manual (per pt report, unreliable historian) Additional Comments: pt resides at [facility]    Prior Function Prior Level of Function : Patient poor historian/Family not available             Mobility Comments: per patient he could transfer himself to the wheelchair ADLs Comments: poor historian, unsure of level of assist at SNF     Hand Dominance   Dominant Hand:  (did not specify)    Extremity/Trunk  Assessment   Upper Extremity Assessment Upper Extremity Assessment: Defer to OT evaluation    Lower Extremity Assessment Lower Extremity Assessment: Generalized weakness;LLE deficits/detail LLE Deficits / Details: LT AKA in September of 2022       Communication   Communication: No difficulties  Cognition Arousal/Alertness: Awake/alert Behavior During Therapy: WFL for tasks assessed/performed Overall Cognitive Status: No family/caregiver present to determine baseline cognitive functioning                                 General Comments: When asked where he lives patient states "here," initially reports month is April and cannot state the current year. WHen asked if he uses a wheelchair or walker patient replies "yes"        General Comments General comments (skin integrity, edema, etc.): 15L HFNC, pt dropped to low of 81% and recovered quickly to 92% once sitting.    Exercises     Assessment/Plan    PT Assessment Patient needs continued PT services  PT Problem List Decreased strength;Decreased activity tolerance;Decreased balance;Decreased mobility;Decreased cognition;Decreased knowledge of use of DME;Decreased safety awareness;Decreased knowledge of precautions;Cardiopulmonary status limiting activity       PT Treatment Interventions DME instruction;Gait training;Functional mobility training;Therapeutic activities;Therapeutic exercise;Balance training;Neuromuscular re-education;Patient/family education    PT Goals (Current goals can be found in the Care Plan section)  Acute Rehab PT Goals Patient Stated Goal: get prosthetic and rehab to bemore mobile PT Goal Formulation: With patient Time For Goal Achievement: 03/02/21 Potential to Achieve Goals: Fair    Frequency Min 2X/week     Co-evaluation PT/OT/SLP Co-Evaluation/Treatment: Yes Reason for Co-Treatment: For patient/therapist safety;To address functional/ADL transfers PT goals addressed during  session: Mobility/safety with mobility;Balance;Proper use of DME OT goals addressed during session: ADL's and self-care       AM-PAC PT "6 Clicks" Mobility  Outcome Measure Help needed turning from your back to your side while in a flat bed without using bedrails?: A Little Help needed moving from lying on your back to sitting on the side of a flat bed without using bedrails?: A Little Help needed moving to and from a bed to a chair (including a wheelchair)?: A Lot Help needed standing up from a chair using your arms (e.g., wheelchair or bedside chair)?: A Lot Help needed to walk in hospital room?: Total Help needed climbing 3-5 steps with a railing? : Total 6 Click Score: 12    End of Session Equipment Utilized During Treatment: Gait belt Activity Tolerance: Patient tolerated treatment well Patient left: in chair;with call bell/phone within reach;with chair alarm set Nurse Communication: Mobility status PT Visit Diagnosis: Unsteadiness on feet (R26.81);Other abnormalities of gait and mobility (R26.89);Muscle weakness (generalized) (M62.81);Difficulty in walking, not elsewhere classified (R26.2)    Time: 2725-3664 PT Time Calculation (min) (ACUTE ONLY): 24 min   Charges:   PT Evaluation $PT Eval Moderate Complexity: 1 Mod  Verner Mould, DPT Acute Rehabilitation Services Office [phone] Pager [phone]   Jacques Navy 02/16/2021, 5:18 PM

## 2021-02-16 NOTE — Progress Notes (Signed)
Inpatient Diabetes Program Recommendations  AACE/ADA: New Consensus Statement on Inpatient Glycemic Control (2015)  Target Ranges:  Prepandial:   less than 140 mg/dL      Peak postprandial:   less than 180 mg/dL (1-2 hours)      Critically ill patients:  140 - 180 mg/dL   Lab Results  Component Value Date   GLUCAP 287 (H) 02/16/2021   HGBA1C 7.7 (H) 02/12/2021    Review of Glycemic Control  Diabetes history: DM2 Outpatient Diabetes medications: glipizide 10 mg BID, metformin 750 mg BID, Semaglutide 7 mg QD Current orders for Inpatient glycemic control: Semglee 15 units QHS, Novolog 0-15 TID with meals and 0-5 HS + 3 units TID  HgbA1C - 7.7%  Inpatient Diabetes Program Recommendations:    Increase Semglee to 18 units QD Increase Novolog to 6 units TID with meals if eating > 50%  Continue to follow glucose trends.  Thank you. Lorenda Peck, RD, LDN, CDE Inpatient Diabetes Coordinator [phone]

## 2021-02-17 ENCOUNTER — Encounter (HOSPITAL_COMMUNITY): Payer: Self-pay | Admitting: Internal Medicine

## 2021-02-17 DIAGNOSIS — J9601 Acute respiratory failure with hypoxia: Secondary | ICD-10-CM

## 2021-02-17 LAB — CBC WITH DIFFERENTIAL/PLATELET
Abs Immature Granulocytes: 0.53 10*3/uL — ABNORMAL HIGH (ref 0.00–0.07)
Basophils Absolute: 0.1 10*3/uL (ref 0.0–0.1)
Basophils Relative: 0 %
Eosinophils Absolute: 0 10*3/uL (ref 0.0–0.5)
Eosinophils Relative: 0 %
HCT: 49.7 % (ref 39.0–52.0)
Hemoglobin: 16.6 g/dL (ref 13.0–17.0)
Immature Granulocytes: 3 %
Lymphocytes Relative: 4 %
Lymphs Abs: 0.7 10*3/uL (ref 0.7–4.0)
MCH: 29.9 pg (ref 26.0–34.0)
MCHC: 33.4 g/dL (ref 30.0–36.0)
MCV: 89.5 fL (ref 80.0–100.0)
Monocytes Absolute: 1.1 10*3/uL — ABNORMAL HIGH (ref 0.1–1.0)
Monocytes Relative: 7 %
Neutro Abs: 14.2 10*3/uL — ABNORMAL HIGH (ref 1.7–7.7)
Neutrophils Relative %: 86 %
Platelets: 328 10*3/uL (ref 150–400)
RBC: 5.55 MIL/uL (ref 4.22–5.81)
RDW: 12.2 % (ref 11.5–15.5)
WBC: 16.6 10*3/uL — ABNORMAL HIGH (ref 4.0–10.5)
nRBC: 0 % (ref 0.0–0.2)

## 2021-02-17 LAB — COMPREHENSIVE METABOLIC PANEL
ALT: 24 U/L (ref 0–44)
AST: 16 U/L (ref 15–41)
Albumin: 3.1 g/dL — ABNORMAL LOW (ref 3.5–5.0)
Alkaline Phosphatase: 56 U/L (ref 38–126)
Anion gap: 7 (ref 5–15)
BUN: 37 mg/dL — ABNORMAL HIGH (ref 8–23)
CO2: 26 mmol/L (ref 22–32)
Calcium: 8.4 mg/dL — ABNORMAL LOW (ref 8.9–10.3)
Chloride: 101 mmol/L (ref 98–111)
Creatinine, Ser: 0.84 mg/dL (ref 0.61–1.24)
GFR, Estimated: >60 mL/min (ref >60)
Glucose, Bld: 379 mg/dL — ABNORMAL HIGH (ref 70–99)
Potassium: 4.8 mmol/L (ref 3.5–5.1)
Sodium: 134 mmol/L — ABNORMAL LOW (ref 135–145)
Total Bilirubin: 0.4 mg/dL (ref 0.3–1.2)
Total Protein: 6.3 g/dL — ABNORMAL LOW (ref 6.5–8.1)

## 2021-02-17 LAB — GLUCOSE, CAPILLARY
Glucose-Capillary: 275 mg/dL — ABNORMAL HIGH (ref 70–99)
Glucose-Capillary: 257 mg/dL — ABNORMAL HIGH (ref 70–99)
Glucose-Capillary: 367 mg/dL — ABNORMAL HIGH (ref 70–99)
Glucose-Capillary: 330 mg/dL — ABNORMAL HIGH (ref 70–99)

## 2021-02-17 LAB — D-DIMER, QUANTITATIVE: D-Dimer, Quant: 1.95 ug/mL-FEU — ABNORMAL HIGH (ref 0.00–0.50)

## 2021-02-17 LAB — MAGNESIUM: Magnesium: 2.2 mg/dL (ref 1.7–2.4)

## 2021-02-17 LAB — C-REACTIVE PROTEIN: CRP: 0.9 mg/dL (ref <1.0)

## 2021-02-17 LAB — FERRITIN: Ferritin: 91 ng/mL (ref 24–336)

## 2021-02-17 MED ORDER — INSULIN ASPART 100 UNIT/ML IJ SOLN
8.0000 [IU] | Freq: Three times a day (TID) | INTRAMUSCULAR | Status: DC
  Administered 2021-02-17 – 2021-02-20 (×9): 8 [IU] via SUBCUTANEOUS

## 2021-02-17 MED ORDER — MELATONIN 5 MG PO TABS
10.0000 mg | ORAL_TABLET | Freq: Once | ORAL | Status: AC
  Administered 2021-02-17: 10 mg via ORAL
  Filled 2021-02-17: qty 2

## 2021-02-17 MED ORDER — BUDESONIDE 0.5 MG/2ML IN SUSP
0.5000 mg | Freq: Two times a day (BID) | RESPIRATORY_TRACT | Status: DC
  Administered 2021-02-17 – 2021-03-03 (×28): 0.5 mg via RESPIRATORY_TRACT
  Filled 2021-02-17 (×28): qty 2

## 2021-02-17 MED ORDER — GUAIFENESIN ER 600 MG PO TB12
1200.0000 mg | ORAL_TABLET | Freq: Two times a day (BID) | ORAL | Status: DC
  Administered 2021-02-17 – 2021-03-03 (×29): 1200 mg via ORAL
  Filled 2021-02-17 (×30): qty 2

## 2021-02-17 MED ORDER — INSULIN GLARGINE-YFGN 100 UNIT/ML ~~LOC~~ SOLN
22.0000 [IU] | Freq: Every day | SUBCUTANEOUS | Status: DC
  Administered 2021-02-17 – 2021-02-18 (×2): 22 [IU] via SUBCUTANEOUS
  Filled 2021-02-17 (×2): qty 0.22

## 2021-02-17 MED ORDER — MELATONIN 5 MG PO TABS
10.0000 mg | ORAL_TABLET | Freq: Once | ORAL | Status: AC
Start: 2021-02-17 — End: 2021-02-17
  Administered 2021-02-17: 10 mg via ORAL
  Filled 2021-02-17: qty 2

## 2021-02-17 NOTE — Progress Notes (Signed)
Inpatient Diabetes Program Recommendations  AACE/ADA: New Consensus Statement on Inpatient Glycemic Control (2015)  Target Ranges:  Prepandial:   less than 140 mg/dL      Peak postprandial:   less than 180 mg/dL (1-2 hours)      Critically ill patients:  140 - 180 mg/dL    Latest Reference Range & Units 02/16/21 08:24 02/16/21 11:59 02/16/21 16:16 02/16/21 21:13  Glucose-Capillary 70 - 99 mg/dL 287 (H)  14 units Novolog  252 (H)  14 units Novolog  339 (H)  25 units Novolog  275 (H)  3 units Novolog  18 units Semglee    Latest Reference Range & Units 02/17/21 08:24  Glucose-Capillary 70 - 99 mg/dL 275 (H)  16 units Novolog       Home DM Meds: Glipizide 10 mg BID (NOT taking)      Metformin 750 mg BID      Semaglutide 7 mg Daily   Current Orders: Semglee 18 units QHS      Novolog 5 units TID with meals      Novolog Resistant Correction Scale/ SSI (0-20 units) TID AC + HS      MD- Please consider the following while pt remains on Solumedrol 80 mg BID:  1. Increase Semglee to 22 units QHS (20% increase)   2. Increase Novolog Meal Coverage to 8 units TID with meals     --Will follow patient during hospitalization--  Wyn Quaker RN, MSN, CDE Diabetes Coordinator Inpatient Glycemic Control Team Team Pager: [phone] (8a-5p)

## 2021-02-17 NOTE — Progress Notes (Signed)
PT Cancellation Note  Patient Details Name: Anthony [name]. MRN: [id number] DOB: April 02, 1950   Cancelled Treatment:    Reason Eval/Treat Not Completed: Patient declined, no reason specified (pt tired and requesting to rest despite encouragement and education on benefits of exercise.)   Verner Mould, DPT [place] Office [phone] Pager [phone]

## 2021-02-17 NOTE — Progress Notes (Signed)
[facility]                                                                               Dolton Shaker, is a 71 y.o. male, DOB - [birth year], IRC:[id number] Admit date - 02/12/2021    Outpatient Primary MD for the patient is [name], DO  LOS - 5  days    Brief summary   Mr. Bonura is a 71 yo male with PMH CAD, PAD, HLD, COPD, bipolar, DM II who presented with worsening dyspnea at home.  He resides at a skilled nursing facility and was recently diagnosed with [place] outpatient around approximately 02/03/2021.  Due to becoming hypoxic, he was sent to the ER for further evaluation. He also underwent left AKA on 09/21/2020 and has had recurrent falls since. He was positive for COVID-19 on evaluation in the ER as well as significantly hypoxic requiring nonrebreather.  He was started on remdesivir, steroids, and breathing treatments. He was also evaluated for trauma from his falls and found to have multiple rib fractures bilaterally.   Assessment & Plan    Assessment and Plan: * Acute respiratory failure with hypoxia ([place])- (present on admission) - Chronic underlying lung disease, states he is on O2 at home. CTA chest shows bilateral paraseptal emphysema with chronic ILD.  Also has multiple pulmonary nodules.  No PE. -Etiology for worsening hypoxia felt due to exacerbation of underlying COPD as well as superimposed COVID infection - Procalcitonin less than 0.1, status post Lasix 20 mg IV x1, completed remdesivir -Difficult to wean O2, pulmonology consulted  HTN (hypertension) - BP meds on hold, currently stable, will gradually resume  History of DVT (deep vein thrombosis) - Continue Xarelto  Rib fractures - Likely due to recurrent falls after his left AKA - CTA chest shows left 3rd - 5th and left 5th and 9th -Continue I-S, flutter valve   Bipolar 1 disorder ([place]) - continue mirtazapine, no acute issues  Hypokalemia - Replete PRN  COVID-19 virus infection -  Reportedly diagnosed approximately 1 week prior to admission but unclear if he has been treated - Given worsening oxygen demands and several comorbidities, completing remdesivir on 2/9  -Continue IV Solu-Medrol, flutter valve, I-S, O2, pulmonology consulted  Multiple lung nodules- (present on admission) - Appears to be known from 2020 - CTA chest shows 6 mm left lower lobe, 3 and 5 mm right middle lobe - appears stable/smaller from prior imagining, continue outpatient monitoring   DM2 (diabetes mellitus, type 2) ([place]) - A1c 7.7% on admission - CBGs elevated likely due to steroids -Increase basal insulin to 22 units, increase NovoLog with meals 8 units 3 times daily AC, sliding scale insulin, resistant Recent Labs    02/16/21 0824 02/16/21 1159 02/16/21 1616 02/16/21 2113 02/17/21 0824 02/17/21 1142  GLUCAP 287* 252* 339* 275* 275* 257*      COPD (chronic obstructive pulmonary disease) ([place])- (present on admission) - Likely has mild exacerbation in the setting of ongoing COVID infection  - Continue incentive spirometry, flutter valve and wean O2 as tolerated -Persistent hypoxia, on 15 L O2, difficult to wean, received Lasix 20 mg IV x1 on 2/9.  - Requested pulmonology  consult.   Hyperlipidemia associated with type 2 diabetes mellitus ([place])- (present on admission) - Continue Lipitor  CAD (coronary artery disease)- (present on admission) - Continue aspirin, statin, Xarelto -Continue to hold beta-blocker, mild bradycardia    RN Pressure Injury Documentation: Pressure Injury 02/12/21 Buttocks Medial Stage 2 -  Partial thickness loss of dermis presenting as a shallow open injury with a red, pink wound bed without slough. (Active)  02/12/21 1830  Location: Buttocks  Location Orientation: Medial  Staging: Stage 2 -  Partial thickness loss of dermis presenting as a shallow open injury with a red, pink wound bed without slough.  Wound Description (Comments):   Present on  Admission: Yes    Code Status: Full CODE STATUS DVT Prophylaxis:   rivaroxaban (XARELTO) tablet 20 mg   Level of Care: Level of care: Stepdown Family Communication: Updated patient's daughter on phone  Disposition Plan:     Remains inpatient appropriate: still persistent hypoxia   Procedures:    Consultants:   Pulmonary critical care  Antimicrobials:   Anti-infectives (From admission, onward)    Start     Dose/Rate Route Frequency Ordered Stop   02/13/21 1000  remdesivir 100 mg in sodium chloride 0.9 % 100 mL IVPB       See Hyperspace for full Linked Orders Report.   100 mg 200 mL/hr over 30 Minutes Intravenous Daily 02/12/21 1742 02/16/21 1153   02/12/21 1845  remdesivir 200 mg in sodium chloride 0.9% 250 mL IVPB       See Hyperspace for full Linked Orders Report.   200 mg 580 mL/hr over 30 Minutes Intravenous Once 02/12/21 1742 02/12/21 2046        Medications  Scheduled Meds:  vitamin C  500 mg Oral Daily   aspirin EC  81 mg Oral Daily   atorvastatin  40 mg Oral Daily   budesonide (PULMICORT) nebulizer solution  0.5 mg Nebulization BID   Chlorhexidine Gluconate Cloth  6 each Topical Q0600   feeding supplement (GLUCERNA SHAKE)  237 mL Oral BID BM   gabapentin  100 mg Oral BID   guaiFENesin  1,200 mg Oral BID   insulin aspart  0-20 Units Subcutaneous TID WC   insulin aspart  0-5 Units Subcutaneous QHS   insulin aspart  8 Units Subcutaneous TID WC   insulin glargine-yfgn  22 Units Subcutaneous QHS   ipratropium-albuterol  3 mL Nebulization TID   mouth rinse  15 mL Mouth Rinse BID   methylPREDNISolone (SOLU-MEDROL) injection  80 mg Intravenous Q12H   mirtazapine  30 mg Oral QHS   multivitamin with minerals  1 tablet Oral Daily   potassium chloride  40 mEq Oral Daily   Ensure Max Protein  11 oz Oral Daily   rivaroxaban  20 mg Oral Q supper   zinc sulfate  220 mg Oral Daily   Continuous Infusions:  sodium chloride 10 mL/hr at 02/15/21 1833   PRN  Meds:.sodium chloride, acetaminophen **OR** acetaminophen, chlorpheniramine-HYDROcodone    Subjective:   Kjell Brannen was seen and examined today.  No acute complaints per the patient.  Overnight hypoxia and was placed on normal breathing.  No chest pain, nausea vomiting or dizziness.  Objective:   Vitals:   02/17/21 1100 02/17/21 1152 02/17/21 1200 02/17/21 1254  BP: (!) 161/81  111/76   Pulse: 78  61 76  Resp: (!) 22  19 (!) 22  Temp:  97.8 F (36.6 C)    TempSrc:  Oral  SpO2: (!) 89%  (!) 88% (!) 89%  Weight:      Height:        Intake/Output Summary (Last 24 hours) at 02/17/2021 1256 Last data filed at 02/17/2021 1200 Gross per 24 hour  Intake 240 ml  Output 3050 ml  Net -2810 ml   Filed Weights   02/12/21 1823  Weight: 87.2 kg     Exam General: Alert and oriented x 3, NAD Cardiovascular: S1 S2 auscultated, no murmurs, RRR Respiratory: Bilateral scattered wheezing Gastrointestinal: Soft, nontender, nondistended, + bowel sounds Ext: left AKA Neuro: no new deficits   Data Reviewed:  I have personally reviewed following labs and imaging studies   CBC Lab Results  Component Value Date   WBC 16.6 (H) 02/17/2021   RBC 5.55 02/17/2021   HGB 16.6 02/17/2021   HCT 49.7 02/17/2021   MCV 89.5 02/17/2021   MCH 29.9 02/17/2021   PLT 328 02/17/2021   MCHC 33.4 02/17/2021   RDW 12.2 02/17/2021   LYMPHSABS 0.7 02/17/2021   MONOABS 1.1 (H) 02/17/2021   EOSABS 0.0 02/17/2021   BASOSABS 0.1 68/34/1962     Last metabolic panel Lab Results  Component Value Date   NA 134 (L) 02/17/2021   K 4.8 02/17/2021   CL 101 02/17/2021   CO2 26 02/17/2021   BUN 37 (H) 02/17/2021   CREATININE 0.84 02/17/2021   GLUCOSE 379 (H) 02/17/2021   GFRNONAA >60 02/17/2021   GFRAA 84 10/30/2019   CALCIUM 8.4 (L) 02/17/2021   PROT 6.3 (L) 02/17/2021   ALBUMIN 3.1 (L) 02/17/2021   LABGLOB 2.9 07/02/2018   AGRATIO 1.6 07/02/2018   BILITOT 0.4 02/17/2021   ALKPHOS 56 02/17/2021    AST 16 02/17/2021   ALT 24 02/17/2021   ANIONGAP 7 02/17/2021    CBG (last 3)  Recent Labs    02/16/21 2113 02/17/21 0824 02/17/21 1142  GLUCAP 275* 275* 257*      Coagulation Profile: No results for input(s): INR, PROTIME in the last 168 hours.   Radiology Studies: DG CHEST PORT 1 VIEW  Result Date: 02/16/2021 CLINICAL DATA:  Acute respiratory failure with hypoxia. EXAM: PORTABLE CHEST 1 VIEW COMPARISON:  AP chest 02/12/2021, chest two views 09/08/2018 FINDINGS: Cardiac silhouette is again at the upper limits of normal size. Mediastinal contours are within normal limits with calcification again seen within the aortic arch. There is increased now moderate right hemidiaphragm elevation with decreased right lung volumes. Right basilar horizontal linear lung markings, likely a combination of scarring and subsegmental atelectasis. Diffuse bilateral mild interstitial thickening is similar to prior. No definite pleural effusion. No pneumothorax. No acute skeletal abnormality. IMPRESSION: New moderate elevation of the right hemidiaphragm. Chronic bilateral interstitial thickening, likely scarring. There appears to be subsegmental atelectasis within the right lung. Unchanged greater aeration of the left lung. Electronically Signed   By: Yvonne Kendall M.D.   On: 02/16/2021 08:27       Kaesha Kirsch M.D. [facility] 02/17/2021, 12:56 PM  Available via Epic secure chat 7am-7pm After 7 pm, please refer to night coverage provider listed on amion.

## 2021-02-17 NOTE — Consult Note (Signed)
NAME:  Anthony Awan., MRN:  [id number], DOB:  16-Feb-1950, LOS: 5 ADMISSION DATE:  02/12/2021, CONSULTATION DATE:  2/10 REFERRING MD:  Dr. Tana Coast, CHIEF COMPLAINT:  SOB   History of Present Illness:  71 y/o M who presented to [facility] ER on 2/5 with reports of SOB and low oxygen saturations.   The patient resides at [facility].  He tested positive for COVID on 1/27 and began having respiratory difficulties on 1/29.  EMS was called for evaluation on 2/5 and found the patient to have saturations of 78% on arrival.  He reportedly had been experiencing multiple falls at the facility after left AKA.  Per notes, his baseline at the facility is independent ambulation, self performs ADL's without assistance, no hx of memory issues.   On ER arrival, the patient was requiring O2 but sats had improved.  He was evaluated with a CT of the chest to rule out PE which was negative for PE but showed bilateral paraseptal emphysema, bilateral peripheral chronic interstitial lung disease, pulmonary nodules, multiple bilateral rib fractures.  The patient required 15L salter O2 and intermittent use of NRB when sleeping.  PCT negative.  H was treated with remdesivir, oxygen, & solumedrol. The patients O2 needs remained persistently elevated.  He was diuresed with lasix.  The patient elected for DNI status on admission.    PCCM consulted 2/10 for evaluation of hypoxic respiratory failure.   Pulmonary hx: began smoking age 55, smoked up to 2ppd at his heaviest, quit smoking 1 year ago (2022) when he went into the SNF after his amputation.  Reports he is supposed to be on oxygen at night but the facility does provide it for him.  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   Pertinent  Medical History  Bipolar Depression / Anxiety  HTN HLD CAD s/p MI 2013 Nocturnal Hypoxemia - seen on sleep study 2020, no apnea  Tobacco Abuse  COPD - reported, no formal PFT's on file  DM - Hgb A1c  7.7 02/12/21  Hx DVT - traumatic, s/p coumadin therapy  Vitamin D deficiency  Urinary Frequency  Left AKA - in 2022  Significant Hospital Events: Including procedures, antibiotic start and stop dates in addition to other pertinent events   2/5 Admit  2/10 PCCM consulted for hypoxic respiratory failure   Interim History / Subjective:  Afebrile  Remains on 15L  Pt denies pain, SOB. Reports he was unaware of rib fractures.    Objective   Blood pressure 121/72, pulse 62, temperature (!) 97.1 F (36.2 C), temperature source Axillary, resp. rate 15, height 5' 9"  (1.753 m), weight 87.2 kg, SpO2 (!) 88 %.    FiO2 (%):  [100 %] 100 %   Intake/Output Summary (Last 24 hours) at 02/17/2021 1019 Last data filed at 02/17/2021 0900 Gross per 24 hour  Intake 480 ml  Output 2550 ml  Net -2070 ml   Filed Weights   02/12/21 1823  Weight: 87.2 kg    Examination: General: ill appearing adult male lying in bed in NAD HENT: MM pink/moist, [place] O2 (cannula not in nose and sats 89%), anicteric, face flushed Lungs: non-labored at rest, soft wheeze bilaterally, rhonchi  Cardiovascular: s1s2 rrr, no m/r/g  Abdomen: soft, non-tender, bsx4 active  Extremities: warm/dry, no edema  Neuro: AAOx4, speech clear, MAE   Resolved Hospital Problem list      Assessment & Plan:   Acute on Chronic Hypoxemic  Respiratory Failure  COVID Positive  Multiple Bilateral Rib Fractures s/p Mechanical Fall  Emphysema  Dynamic Airway Collapse - noted on chest CT Elevated Right Hemidiaphragm / RLL ASD  Multiple Pulmonary Nodules   Multifactorial hypoxic respiratory failure in setting of COIVD viral infection (although no significant parenchymal disease/infiltrates), multiple rib fractures with likely splinting, prior baseline nocturnal O2 needs and moderate elevation of the right hemi-diaphragm.   In addition, he has dynamic airway collapse on CT.  Completed remdesivir. No significant increase in O2 needs but continues  to require 15L.  He has new findings in his RLL on CXR 2/9, hopefully this is atelectasis.  Pulmonary trunk not significantly enlarged on CT.    -continue O2 for sats >88% -push pulmonary hygiene - flutter valve, mobilize  -add chest PT  -follow intermittent CXR, monitor RLL. PCT negative / afebrile.  -needs to mobilize / OOB  -schedule guaifenesin  -solumedrol 66m Q12, consider tapering  -adjust pulmicort dosing  -continue duoneb TID -will need to ensure he gets nocturnal O2 as an outpatient. Given his dynamic airway collapse, he may benefit from positive pressure via CPAP / repeat sleep study  -pain control if needed with rib fractures to ensure pulmonary hygiene  -negative balance as renal function / BP permit, s/p lasix 20 mg 2/9 -will need outpatient pulmonary follow up for nodules, seen on imaging since 2020  Best Practice (right click and "Reselect all SmartList Selections" daily)  Per Primary   Labs   CBC: Recent Labs  Lab 02/13/21 0307 02/14/21 0317 02/15/21 0254 02/16/21 0256 02/17/21 0300  WBC 4.9 11.1* 10.9* 13.4* 16.6*  NEUTROABS 3.8 9.4* 9.5* 11.6* 14.2*  HGB 16.1 14.8 15.6 15.8 16.6  HCT 48.6 44.9 46.6 47.3 49.7  MCV 88.4 89.6 88.9 89.4 89.5  PLT 245 264 287 309 3038   Basic Metabolic Panel: Recent Labs  Lab 02/13/21 0307 02/14/21 0317 02/15/21 0254 02/16/21 0256 02/17/21 0300  NA 140 137 135 136 134*  K 4.5 4.5 4.7 4.9 4.8  CL 108 105 100 101 101  CO2 25 26 27 29 26   GLUCOSE 240* 252* 287* 291* 379*  BUN 13 22 29* 28* 37*  CREATININE 0.82 0.72 0.72 0.73 0.84  CALCIUM 8.4* 8.6* 8.8* 8.7* 8.4*  MG 1.9 1.9 2.1 2.1 2.2   GFR: Estimated Creatinine Clearance: 86.9 mL/min (by C-G formula based on SCr of 0.84 mg/dL). Recent Labs  Lab 02/12/21 1454 02/13/21 0307 02/14/21 0317 02/15/21 0254 02/16/21 0256 02/17/21 0300  PROCALCITON <0.10  --   --   --  <0.10  --   WBC  --    < > 11.1* 10.9* 13.4* 16.6*   < > = values in this interval not  displayed.    Liver Function Tests: Recent Labs  Lab 02/13/21 0307 02/14/21 0317 02/15/21 0254 02/16/21 0256 02/17/21 0300  AST 17 14* 12* 14* 16  ALT 21 18 17 20 24   ALKPHOS 49 43 47 46 56  BILITOT 0.3 0.5 0.3 0.3 0.4  PROT 6.9 6.2* 6.6 6.2* 6.3*  ALBUMIN 3.1* 2.9* 3.1* 3.0* 3.1*   No results for input(s): LIPASE, AMYLASE in the last 168 hours. No results for input(s): AMMONIA in the last 168 hours.  ABG    Component Value Date/Time   PHART 7.350 02/12/2021 1348   PCO2ART 48.4 (H) 02/12/2021 1348   PO2ART 98.8 02/12/2021 1348   HCO3 26.0 02/12/2021 1348   TCO2 22 05/05/2013 0535   O2SAT 96.8 02/12/2021 1348  Coagulation Profile: No results for input(s): INR, PROTIME in the last 168 hours.  Cardiac Enzymes: No results for input(s): CKTOTAL, CKMB, CKMBINDEX, TROPONINI in the last 168 hours.  HbA1C: HB A1C (BAYER DCA - WAIVED)  Date/Time Value Ref Range Status  06/04/2017 12:34 PM 7.4 (H) <7.0 % Final    Comment:                                          Diabetic Adult            <7.0                                       Healthy Adult        4.3 - 5.7                                                           (DCCT/NGSP) American Diabetes Association's Summary of Glycemic Recommendations for Adults with Diabetes: Hemoglobin A1c <7.0%. More stringent glycemic goals (A1c <6.0%) may further reduce complications at the cost of increased risk of hypoglycemia.   02/15/2017 04:17 PM 7.6 (H) <7.0 % Final    Comment:                                          Diabetic Adult            <7.0                                       Healthy Adult        4.3 - 5.7                                                           (DCCT/NGSP) American Diabetes Association's Summary of Glycemic Recommendations for Adults with Diabetes: Hemoglobin A1c <7.0%. More stringent glycemic goals (A1c <6.0%) may further reduce complications at the cost of increased risk of hypoglycemia.    Hgb  A1c MFr Bld  Date/Time Value Ref Range Status  02/12/2021 12:54 PM 7.7 (H) 4.8 - 5.6 % Final    Comment:    (NOTE) Pre diabetes:          5.7%-6.4%  Diabetes:              >6.4%  Glycemic control for   <7.0% adults with diabetes   10/30/2019 10:40 AM 7.2 (H) <5.7 % of total Hgb Final    Comment:    For someone without known diabetes, a hemoglobin A1c value of 6.5% or greater indicates that they may have  diabetes and this should be confirmed with a follow-up  test. . For someone with known diabetes, a value <7% indicates  that their diabetes is well controlled  and a value  greater than or equal to 7% indicates suboptimal  control. A1c targets should be individualized based on  duration of diabetes, age, comorbid conditions, and  other considerations. . Currently, no consensus exists regarding use of hemoglobin A1c for diagnosis of diabetes for children. .     CBG: Recent Labs  Lab 02/16/21 0824 02/16/21 1159 02/16/21 1616 02/16/21 2113 02/17/21 0824  GLUCAP 287* 252* 339* 275* 275*    Review of Systems: Positives in [place]   Gen: Denies fever, chills, weight change, fatigue, night sweats HEENT: Denies blurred vision, double vision, hearing loss, tinnitus, sinus congestion, rhinorrhea, sore throat, neck stiffness, dysphagia PULM: Denies shortness of breath, cough, sputum production, hemoptysis, wheezing CV: Denies chest pain, edema, orthopnea, paroxysmal nocturnal dyspnea, palpitations GI: Denies abdominal pain, nausea, vomiting, diarrhea, hematochezia, melena, constipation, change in bowel habits GU: Denies dysuria, hematuria, polyuria, oliguria, urethral discharge Endocrine: Denies hot or cold intolerance, polyuria, polyphagia or appetite change Derm: Denies rash, dry skin, scaling or peeling skin change Heme: Denies easy bruising, bleeding, bleeding gums Neuro: Denies headache, numbness, weakness, slurred speech, loss of memory or consciousness  Past Medical  History:  He,  has a past medical history of Anxiety, Arthritis, Bipolar disorder (HCC), CAD (coronary artery disease), COPD (chronic obstructive pulmonary disease) ([place]), Depression, Glucose intolerance (impaired glucose tolerance), History of DVT (deep vein thrombosis), HLD (hyperlipidemia), Hypertension, Myocardial infarction ([place]), Shortness of breath, Tobacco abuse, Urinary frequency, and Vitamin D deficiency (09/05/2018).   Surgical History:   Past Surgical History:  Procedure Laterality Date   CARDIAC CATHETERIZATION     COLONOSCOPY N/A 12/29/2015   Procedure: COLONOSCOPY;  Surgeon: Rogene Houston, MD;  [facility];  Service: Endoscopy;  Laterality: N/A;  12:55   ELECTROCARDIOGRAM     Showed inferolateral ST-elevation and a code STEMI was activated. In the ER, he was treated with morphine, herarin, and 600 mg of Plavix. He was transferred emergently to [facility].    INGUINAL HERNIA REPAIR Left 07/26/2014   Procedure: OPEN REPAIR OF RECURENT LEFT INGUINAL HERNIA REPAIR WITH MESH;  Surgeon: Greer Pickerel, MD;  Location: [facility];  Service: General;  Laterality: Left;   INSERTION OF MESH Bilateral 10/30/2013   Procedure: INSERTION OF MESH;  Surgeon: Gayland Curry, MD;  Location: [facility];  Service: General;  Laterality: Bilateral;   LAPAROSCOPIC INGUINAL HERNIA WITH UMBILICAL HERNIA Bilateral 10/30/2013   Procedure: laparoscopic repair left pantaloom hernia with mesh, laparoscopic right inguinal hernia with mesh, OPEN REPAIR OF UMBILICAL HERNIA ;  Surgeon: Gayland Curry, MD;  Location: [facility];  Service: General;  Laterality: Bilateral;   LAPAROSCOPY N/A 07/26/2014   Procedure: LAPAROSCOPY DIAGNOSTIC;  Surgeon: Greer Pickerel, MD;  Location: [facility];  Service: General;  Laterality: N/A;   POLYPECTOMY  12/29/2015   Procedure: POLYPECTOMY;  Surgeon: Rogene Houston, MD;  [facility];  Service: Endoscopy;;  ascending colon, descending colon   stent placement     TOTAL HIP  ARTHROPLASTY Right 05/10/2015   Procedure: RIGHT TOTAL HIP ARTHROPLASTY ANTERIOR APPROACH;  Surgeon: Paralee Cancel, MD;  Location: [facility];  Service: Orthopedics;  Laterality: Right;   VASECTOMY       Social History:   reports that he has been smoking cigarettes. He has a 67.50 pack-year smoking history. He has never used smokeless tobacco. He reports that he does not drink alcohol and does not use drugs.   Family History:  His family history includes Coronary artery  disease in his father; Depression in his father and mother; Heart attack in his father.   Allergies Allergies  Allergen Reactions   Flomax [Tamsulosin Hcl] Other (See Comments)    This medication makes patient feel sick   Penicillins Other (See Comments)    Reaction unknown occurred during childhood Has patient had a PCN reaction causing immediate rash, facial/tongue/throat swelling, SOB or lightheadedness with hypotension: unsure - childhood reaction Has patient had a PCN reaction causing severe rash involving mucus membranes or skin necrosis: unsure - childhood reaction Has patient had a PCN reaction that required hospitalization no Has patient had a PCN reaction occurring within the last 10 years: no If all of the above answers are "NO", then may p     Home Medications  Prior to Admission medications   Medication Sig Start Date End Date Taking? Authorizing Provider  albuterol (VENTOLIN HFA) 108 (90 Base) MCG/ACT inhaler Inhale 2 puffs into the lungs in the morning, at noon, and at bedtime.   Yes [provider]  Ascorbic Acid (VITAMIN C) 1000 MG tablet Take 1,000 mg by mouth daily.   Yes [provider]  aspirin EC 81 MG tablet Take 1 tablet (81 mg total) by mouth daily. 02/25/17  Yes Burnell Blanks, MD  atorvastatin (LIPITOR) 40 MG tablet Take 1 tablet (40 mg total) by mouth daily. 12/22/18  Yes Emeterio Reeve, DO  Baclofen 5 MG TABS Take 5 mg by mouth in the morning and at bedtime.   Yes  [provider]  Dextromethorphan-guaiFENesin (ROBITUSSIN COUGH+CHEST CONG DM) 10-200 MG CAPS Take 1 capsule by mouth in the morning, at noon, and at bedtime.   Yes [provider]  gabapentin (NEURONTIN) 100 MG capsule Take 100 mg by mouth 2 (two) times daily.   Yes [provider]  metFORMIN (GLUCOPHAGE-XR) 500 MG 24 hr tablet TAKE 2 TABLETS BY MOUTH  DAILY WITH BREAKFAST Patient taking differently: Take 750 mg by mouth in the morning and at bedtime. 01/18/20  Yes [name], DO  metoprolol succinate (TOPROL-XL) 25 MG 24 hr tablet TAKE ONE-HALF TABLET BY  MOUTH DAILY Patient taking differently: Take 12.5 mg by mouth daily. 12/12/20  Yes [name], DO  mirtazapine (REMERON) 30 MG tablet TAKE 1 TABLET BY MOUTH AT  BEDTIME Patient taking differently: Take 90 mg by mouth at bedtime. 05/26/20  Yes [name], DO  nystatin ointment (MYCOSTATIN) Apply 1 application topically 2 (two) times daily.   Yes [provider]  rivaroxaban (XARELTO) 20 MG TABS tablet Take 20 mg by mouth daily with supper.   Yes [provider]  Semaglutide 7 MG TABS Take 7 mg by mouth daily.   Yes [provider]  temazepam (RESTORIL) 15 MG capsule TAKE 1 CAPSULE BY MOUTH AT BEDTIME AS NEEDED FOR SLEEP Patient taking differently: Take 15 mg by mouth at bedtime. 02/05/20  Yes [name], DO  traZODone (DESYREL) 50 MG tablet TAKE 1 TO 2 TABLETS BY MOUTH AT BEDTIME AS NEEDED FOR SLEEP Patient not taking: Reported on 02/12/2021 06/09/20   [name], DO  baclofen (LIORESAL) 10 MG tablet TAKE 1 TABLET BY MOUTH  TWICE DAILY Patient not taking: Reported on 02/12/2021 03/25/20   [name], DO  blood glucose meter kit and supplies KIT Dispense based on patient and insurance preference. Use to check BG once daily or every other other.  Dx: E11.9 07/17/16   [name] A, FNP  gabapentin (NEURONTIN) 800 MG tablet Take 1 tablet (  800 mg total) by mouth 3 (three) times  daily. Patient not taking: Reported on 02/12/2021 11/14/18   Silverio Decamp, MD  glipiZIDE (GLUCOTROL) 10 MG tablet Take 1 tablet (10 mg total) by mouth 2 (two) times daily before a meal. Patient not taking: Reported on 02/12/2021 12/22/18   Emeterio Reeve, DO  lisinopril (ZESTRIL) 5 MG tablet Take 1 tablet (5 mg total) by mouth daily. Patient not taking: Reported on 02/12/2021 12/22/18   Emeterio Reeve, DO  sertraline (ZOLOFT) 50 MG tablet Take 1/2 tab (76m) by mouth at bedtime for 6 days then increase to 1 tab (521m by mouth every night at bedtime. Patient not taking: Reported on 02/12/2021 01/22/20   Early, SaCoralee PesaNP  traMADol (ULTRAM) 50 MG tablet TAKE 1 TABLET BY MOUTH EVERY 6 HOURS AS NEEDED FOR SEVERE PAIN Patient not taking: Reported on 02/12/2021 02/24/20   MaLuetta NuttingDO     Critical care time: n/a     BrNoe GensMSN, APRN, NP-C, AGACNP-BC [facility] 02/17/2021, 10:19 AM   Please see [email or web address] for pager details.   From 7A-7P if no response, please call [phone] After hours, please call ELink [phone]

## 2021-02-18 ENCOUNTER — Inpatient Hospital Stay (HOSPITAL_COMMUNITY): Payer: Medicare Other

## 2021-02-18 DIAGNOSIS — Z86718 Personal history of other venous thrombosis and embolism: Secondary | ICD-10-CM

## 2021-02-18 DIAGNOSIS — U071 COVID-19: Secondary | ICD-10-CM | POA: Diagnosis not present

## 2021-02-18 DIAGNOSIS — J9601 Acute respiratory failure with hypoxia: Secondary | ICD-10-CM | POA: Diagnosis not present

## 2021-02-18 DIAGNOSIS — R609 Edema, unspecified: Secondary | ICD-10-CM

## 2021-02-18 LAB — GLUCOSE, CAPILLARY
Glucose-Capillary: 258 mg/dL — ABNORMAL HIGH (ref 70–99)
Glucose-Capillary: 319 mg/dL — ABNORMAL HIGH (ref 70–99)
Glucose-Capillary: 298 mg/dL — ABNORMAL HIGH (ref 70–99)
Glucose-Capillary: 277 mg/dL — ABNORMAL HIGH (ref 70–99)

## 2021-02-18 MED ORDER — FUROSEMIDE 10 MG/ML IJ SOLN
40.0000 mg | Freq: Once | INTRAMUSCULAR | Status: AC
  Administered 2021-02-18: 40 mg via INTRAVENOUS
  Filled 2021-02-18: qty 4

## 2021-02-18 MED ORDER — MELATONIN 5 MG PO TABS
10.0000 mg | ORAL_TABLET | Freq: Once | ORAL | Status: AC
  Administered 2021-02-18: 10 mg via ORAL
  Filled 2021-02-18: qty 2

## 2021-02-18 MED ORDER — APIXABAN 5 MG PO TABS
10.0000 mg | ORAL_TABLET | Freq: Two times a day (BID) | ORAL | Status: AC
  Administered 2021-02-18 – 2021-02-25 (×14): 10 mg via ORAL
  Filled 2021-02-18 (×14): qty 2

## 2021-02-18 MED ORDER — APIXABAN 5 MG PO TABS
5.0000 mg | ORAL_TABLET | Freq: Two times a day (BID) | ORAL | Status: DC
  Administered 2021-02-25 – 2021-03-03 (×12): 5 mg via ORAL
  Filled 2021-02-18 (×12): qty 1

## 2021-02-18 MED ORDER — PREDNISONE 20 MG PO TABS
40.0000 mg | ORAL_TABLET | Freq: Every day | ORAL | Status: AC
  Administered 2021-02-19 – 2021-02-24 (×6): 40 mg via ORAL
  Filled 2021-02-18 (×6): qty 2

## 2021-02-18 NOTE — Progress Notes (Signed)
ANTICOAGULATION CONSULT NOTE - Initial Consult  Pharmacy Consult for Xarelto >> Eliquis Indication: history of DVT on Xarelto, now with new DVT  Allergies  Allergen Reactions   Flomax [Tamsulosin Hcl] Other (See Comments)    This medication makes patient feel sick   Penicillins Other (See Comments)    Reaction unknown occurred during childhood Has patient had a PCN reaction causing immediate rash, facial/tongue/throat swelling, SOB or lightheadedness with hypotension: unsure - childhood reaction Has patient had a PCN reaction causing severe rash involving mucus membranes or skin necrosis: unsure - childhood reaction Has patient had a PCN reaction that required hospitalization no Has patient had a PCN reaction occurring within the last 10 years: no If all of the above answers are "NO", then may p    Patient Measurements: Height: 5\' 9"  (175.3 cm) Weight: 87.2 kg (192 lb 3.9 oz) (Simultaneous filing. User may not have seen previous data.) IBW/kg (Calculated) : 70.7  Vital Signs: Temp: 97.8 F (36.6 C) (02/11 0844) Temp Source: Oral (02/11 0844) BP: 82/31 (02/11 0900) Pulse Rate: 71 (02/11 0900)  Labs: Recent Labs    02/16/21 0256 02/17/21 0300  HGB 15.8 16.6  HCT 47.3 49.7  PLT 309 328  CREATININE 0.73 0.84    Estimated Creatinine Clearance: 86.9 mL/min (by C-G formula based on SCr of 0.84 mg/dL).   Medical History: Past Medical History:  Diagnosis Date   Anxiety    Arthritis    Bipolar disorder ([place])    CAD (coronary artery disease)    a. INF STEMI 07/04/10:  tx with thrombectomy + [facility] to [facility];  cath 07/04/10: dLM 10-20%, pLAD 40-50%, mLAD 20-30%, pRCA 30%, mRCA occluded and tx with PCI, EF 50% with inf HK. A Multilink   COPD (chronic obstructive pulmonary disease) (HCC)    Diabetes mellitus ([place])    History of DVT (deep vein thrombosis)    traumatic, s/p coumadin tx.   HLD (hyperlipidemia)    Hypertension    pt states is currently on no medications    Myocardial infarction ([place])    around 2013   Tobacco abuse    Urinary frequency    Vitamin D deficiency 09/05/2018    Medications:  Scheduled:   apixaban  10 mg Oral BID   Followed by   Derrill Memo ON 02/25/2021] apixaban  5 mg Oral BID   vitamin C  500 mg Oral Daily   aspirin EC  81 mg Oral Daily   atorvastatin  40 mg Oral Daily   budesonide (PULMICORT) nebulizer solution  0.5 mg Nebulization BID   Chlorhexidine Gluconate Cloth  6 each Topical Q0600   feeding supplement (GLUCERNA SHAKE)  237 mL Oral BID BM   gabapentin  100 mg Oral BID   guaiFENesin  1,200 mg Oral BID   insulin aspart  0-20 Units Subcutaneous TID WC   insulin aspart  0-5 Units Subcutaneous QHS   insulin aspart  8 Units Subcutaneous TID WC   insulin glargine-yfgn  22 Units Subcutaneous QHS   ipratropium-albuterol  3 mL Nebulization TID   mouth rinse  15 mL Mouth Rinse BID   mirtazapine  30 mg Oral QHS   multivitamin with minerals  1 tablet Oral Daily   potassium chloride  40 mEq Oral Daily   [START ON 02/19/2021] predniSONE  40 mg Oral Q breakfast   Ensure Max Protein  11 oz Oral Daily   zinc sulfate  220 mg Oral Daily    Assessment: 71 yo male  admitted for worsening dyspnea, rib fractures from fall at facility.  Diagnosed with COVID 02/03/21.  Notable PMH includes CAD, L AKA 09/2020, and history of DVT.  Patient was on Xarelto PTA that was resumed on admission.    Dopplers 2/11: DVT in the RLE from the proximal femoral to the popliteal as well as in the single posterior tibial. Also has bilateral arterial disease with occlusion of the proximal-distal LT SFA and focal dilation of the proximal RT SFA.   Given new DVT, CCM recommends transitioning the patient from Xarelto to Eliquis.   Goal of Therapy:  Monitor platelets by anticoagulation protocol: Yes   Plan:  Discontinue Xarelto Begin Eliquis 10mg  PO BID x 7 days, followed by Eliquis 5mg  PO BID thereafter - First dose of Eliquis to be given @ 1700 this evening   Monitor daily CBC, s/sx bleeding  Pharmacy will formally sign off consult but continue to monitor patient peripherally.   Dimple Nanas, PharmD 02/18/2021 11:43 AM

## 2021-02-18 NOTE — Discharge Instructions (Addendum)
Information on my medicine - ELIQUIS (apixaban)  Why was Eliquis prescribed for you? Eliquis was prescribed to treat blood clots that may have been found in the veins of your legs (deep vein thrombosis) or in your lungs (pulmonary embolism) and to reduce the risk of them occurring again.  What do You need to know about Eliquis ? The starting dose is 10 mg (two 5 mg tablets) taken TWICE daily for the FIRST SEVEN (7) DAYS, then on (enter date)  02/25/21 in the evening  the dose is reduced to ONE 5 mg tablet taken TWICE daily.  Eliquis may be taken with or without food.   Try to take the dose about the same time in the morning and in the evening. If you have difficulty swallowing the tablet whole please discuss with your pharmacist how to take the medication safely.  Take Eliquis exactly as prescribed and DO NOT stop taking Eliquis without talking to the doctor who prescribed the medication.  Stopping may increase your risk of developing a new blood clot.  Refill your prescription before you run out.  After discharge, you should have regular check-up appointments with your healthcare provider that is prescribing your Eliquis.    What do you do if you miss a dose? If a dose of ELIQUIS is not taken at the scheduled time, take it as soon as possible on the same day and twice-daily administration should be resumed. The dose should not be doubled to make up for a missed dose.  Important Safety Information A possible side effect of Eliquis is bleeding. You should call your healthcare provider right away if you experience any of the following: Bleeding from an injury or your nose that does not stop. Unusual colored urine (red or dark brown) or unusual colored stools (red or black). Unusual bruising for unknown reasons. A serious fall or if you hit your head (even if there is no bleeding).  Some medicines may interact with Eliquis and might increase your risk of bleeding or clotting while on  Eliquis. To help avoid this, consult your healthcare provider or pharmacist prior to using any new prescription or non-prescription medications, including herbals, vitamins, non-steroidal anti-inflammatory drugs (NSAIDs) and supplements.  This website has more information on Eliquis (apixaban): http://www.eliquis.com/eliquis/home

## 2021-02-18 NOTE — Progress Notes (Signed)
Lower extremity venous RT study completed.  Preliminary results relayed to Ripudeep, MD.  See CV Proc for preliminary results report.   Darlin Coco, RDMS, RVT

## 2021-02-18 NOTE — Progress Notes (Addendum)
[facility]                                                                               Anthony [name], is a 71 y.o. male, DOB - Apr 07, 1950, JSH:[id number] Admit date - 02/12/2021    Outpatient Primary MD for the patient is [name], DO  LOS - 6  days    Brief summary   Anthony [name] is a 71 yo male with PMH CAD, PAD, HLD, COPD, bipolar, DM II who presented with worsening dyspnea at home.  He resides at a skilled nursing facility and was recently diagnosed with [place] outpatient around approximately 02/03/2021.  Due to becoming hypoxic, he was sent to the ER for further evaluation. He also underwent left AKA on 09/21/2020 and has had recurrent falls since. He was positive for COVID-19 on evaluation in the ER as well as significantly hypoxic requiring nonrebreather.  He was started on remdesivir, steroids, and breathing treatments. He was also evaluated for trauma from his falls and found to have multiple rib fractures bilaterally.   Assessment & Plan    Assessment and Plan: * Acute respiratory failure with hypoxia ([place])- (present on admission) - Chronic underlying lung disease, states he is on O2 at home. CTA chest shows bilateral paraseptal emphysema with chronic ILD.  Also has multiple pulmonary nodules.  No PE. -Etiology for worsening hypoxia felt due to exacerbation of underlying COPD as well as superimposed COVID infection - Appreciate pulmonology recommendations, wean O2 as tolerated  HTN (hypertension) - Continue to hold BP meds  History of DVT (deep vein thrombosis) - on Xarelto Addendum Venous doppler: DVT RLE from the proximal femoral to the popliteal as well as in a single posterior tibial.  Also has bilateral arterial disease with occlusion of the proximal-distal LT SFA and a focal dilitation of the proximal RT SFA to 2.5 cm - d/w Dr. Tamala Julian, recommended therapeutic eliquis. He has  known history of PAD.      Rib fractures - Likely due to recurrent falls  after his left AKA - CTA chest shows left 3rd - 5th and left 5th and 9th rib fractures -Continue incentive spirometry   Bipolar 1 disorder ([place]) - continue mirtazapine, no acute issues  Hypokalemia - Replete PRN  COVID-19 virus infection - Reportedly diagnosed approximately 1 week prior to admission but unclear if he has been treated - Given worsening oxygen demands and several comorbidities, completed remdesivir on 2/9  -IV Solu-Medrol transition to p.o. prednisone, on incentive spirometry.  Pulmonology following  Multiple lung nodules- (present on admission) - Appears to be known from 2020 - CTA chest shows 6 mm left lower lobe, 3 and 5 mm right middle lobe - appears stable/smaller from prior imagining, continue outpatient monitoring   DM2 (diabetes mellitus, type 2) (HCC) - A1c 7.7% on admission - CBGs elevated to 300s due to IV steroids.   -IV Solu-Medrol discontinued, tapered to oral prednisone.   -Continue basal insulin 22 units, NovoLog meal coverage 8 units 3 times daily AC, SSI.    COPD (chronic obstructive pulmonary disease) ([place])- (present on admission) - Likely has mild exacerbation in the setting of ongoing COVID infection  -  Still on 15 L HFNC, appreciate pulmonology recommendations, Lasix 40 mg IV x1, IV steroids transition to p.o. prednisone -Venous Dopplers lower extremities pending, on Xarelto  Hyperlipidemia associated with type 2 diabetes mellitus ([place])- (present on admission) - Continue Lipitor  CAD (coronary artery disease)- (present on admission) - Continue aspirin, statin, Xarelto -Continue to hold beta-blocker, mild bradycardia    Pressure Injury Documentation: Pressure Injury 02/12/21 Buttocks Medial Stage 2 -  Partial thickness loss of dermis presenting as a shallow open injury with a red, pink wound bed without slough. (Active)  02/12/21 1830  Location: Buttocks  Location Orientation: Medial  Staging: Stage 2 -  Partial thickness loss of  dermis presenting as a shallow open injury with a red, pink wound bed without slough.  Wound Description (Comments):   Present on Admission: Yes     Code Status: Partial CODE STATUS DVT Prophylaxis:   rivaroxaban (XARELTO) tablet 20 mg   Level of Care: Level of care: Stepdown Family Communication: Updated patient's daughter yesterday  Disposition Plan:     Remains inpatient appropriate: Still on 15 L O2 via [place]   Procedures:  none  Consultants:   Pulmonology  Antimicrobials:   Anti-infectives (From admission, onward)    Start     Dose/Rate Route Frequency Ordered Stop   02/13/21 1000  remdesivir 100 mg in sodium chloride 0.9 % 100 mL IVPB       See Hyperspace for full Linked Orders Report.   100 mg 200 mL/hr over 30 Minutes Intravenous Daily 02/12/21 1742 02/16/21 1153   02/12/21 1845  remdesivir 200 mg in sodium chloride 0.9% 250 mL IVPB       See Hyperspace for full Linked Orders Report.   200 mg 580 mL/hr over 30 Minutes Intravenous Once 02/12/21 1742 02/12/21 2046        Medications  Scheduled Meds:  vitamin C  500 mg Oral Daily   aspirin EC  81 mg Oral Daily   atorvastatin  40 mg Oral Daily   budesonide (PULMICORT) nebulizer solution  0.5 mg Nebulization BID   Chlorhexidine Gluconate Cloth  6 each Topical Q0600   feeding supplement (GLUCERNA SHAKE)  237 mL Oral BID BM   gabapentin  100 mg Oral BID   guaiFENesin  1,200 mg Oral BID   insulin aspart  0-20 Units Subcutaneous TID WC   insulin aspart  0-5 Units Subcutaneous QHS   insulin aspart  8 Units Subcutaneous TID WC   insulin glargine-yfgn  22 Units Subcutaneous QHS   ipratropium-albuterol  3 mL Nebulization TID   mouth rinse  15 mL Mouth Rinse BID   mirtazapine  30 mg Oral QHS   multivitamin with minerals  1 tablet Oral Daily   potassium chloride  40 mEq Oral Daily   [START ON 02/19/2021] predniSONE  40 mg Oral Q breakfast   Ensure Max Protein  11 oz Oral Daily   rivaroxaban  20 mg Oral Q supper    zinc sulfate  220 mg Oral Daily   Continuous Infusions:  sodium chloride Stopped (02/17/21 0854)   PRN Meds:.sodium chloride, acetaminophen **OR** acetaminophen, chlorpheniramine-HYDROcodone    Subjective:   Anthony [name] was seen and examined today.  Appears to be deconditioned and not very motivated.  No fevers or chills, no acute shortness of breath.  Still on 15 L O2 via .  No nausea vomiting abdominal pain or any diarrhea.  Objective:   Vitals:   02/18/21 0844 02/18/21 0849 02/18/21 0850 02/18/21  0900  BP:    (!) 82/31  Pulse:    71  Resp:    18  Temp: 97.8 F (36.6 C)     TempSrc: Oral     SpO2:  92% 92% 94%  Weight:      Height:        Intake/Output Summary (Last 24 hours) at 02/18/2021 1126 Last data filed at 02/18/2021 1017 Gross per 24 hour  Intake 828.96 ml  Output 3000 ml  Net -2171.04 ml   Filed Weights   02/12/21 1823  Weight: 87.2 kg     Exam General: Alert and oriented x 3, NAD Cardiovascular: S1 S2 auscultated,RRR Respiratory: Clear to auscultation bilaterally, no wheezing, rales or rhonchi Gastrointestinal: Diminished breath sounds at the bases Ext: no pedal edema bilaterally Neuro: no new deficits Psych: flat affect   Data Reviewed:  I have personally reviewed following labs and imaging studies   CBC Lab Results  Component Value Date   WBC 16.6 (H) 02/17/2021   RBC 5.55 02/17/2021   HGB 16.6 02/17/2021   HCT 49.7 02/17/2021   MCV 89.5 02/17/2021   MCH 29.9 02/17/2021   PLT 328 02/17/2021   MCHC 33.4 02/17/2021   RDW 12.2 02/17/2021   LYMPHSABS 0.7 02/17/2021   MONOABS 1.1 (H) 02/17/2021   EOSABS 0.0 02/17/2021   BASOSABS 0.1 25/63/8937     Last metabolic panel Lab Results  Component Value Date   NA 134 (L) 02/17/2021   K 4.8 02/17/2021   CL 101 02/17/2021   CO2 26 02/17/2021   BUN 37 (H) 02/17/2021   CREATININE 0.84 02/17/2021   GLUCOSE 379 (H) 02/17/2021   GFRNONAA >60 02/17/2021   GFRAA 84 10/30/2019   CALCIUM  8.4 (L) 02/17/2021   PROT 6.3 (L) 02/17/2021   ALBUMIN 3.1 (L) 02/17/2021   LABGLOB 2.9 07/02/2018   AGRATIO 1.6 07/02/2018   BILITOT 0.4 02/17/2021   ALKPHOS 56 02/17/2021   AST 16 02/17/2021   ALT 24 02/17/2021   ANIONGAP 7 02/17/2021    CBG (last 3)  Recent Labs    02/17/21 1703 02/17/21 2106 02/18/21 0811  GLUCAP 367* 330* 258*      Coagulation Profile: No results for input(s): INR, PROTIME in the last 168 hours.   Radiology Studies: DG CHEST PORT 1 VIEW  Result Date: 02/18/2021 CLINICAL DATA:  71 year old male with history of acute respiratory failure with hypoxia.  IMPRESSION: 1. Diffuse peribronchial cuffing concerning for an acute bronchitis. 2. Bibasilar opacities which may reflect areas of atelectasis and/or consolidation. 3. Small left pleural effusion. 4. Elevation of the right hemidiaphragm again noted. 5. Aortic atherosclerosis. Electronically Signed   By: [name] M.D.   On: 02/18/2021 08:09       Shilee Biggs M.D. [facility] 02/18/2021, 11:26 AM  Available via Epic secure chat 7am-7pm After 7 pm, please refer to night coverage provider listed on amion.

## 2021-02-18 NOTE — TOC Progression Note (Signed)
Transition of Care (TOC) - Progression Note    Patient Details  Name: Zykee Avakian. MRN: [id number] Date of Birth: 10/29/1950  Transition of Care [facility]) CM/SW [place], Hien Cunliffe [place] Phone Number: 02/18/2021, 9:34 AM  Clinical Narrative:   Gustavus Messing request for insurance authorization to commence on Monday for return to [address] TOC will continue to follow during the course of hospitalization.     Expected Discharge Plan: [place] Barriers to Discharge: Continued Medical Work up  Expected Discharge Plan and Services Expected Discharge Plan: [place] In-house Referral: Clinical Social Work   Post Acute Care Choice: [place] Living arrangements for the past 2 months: [place]                 DME Arranged: N/A DME Agency: NA                   Social Determinants of Health (SDOH) Interventions    Readmission Risk Interventions Readmission Risk Prevention Plan 02/13/2021  Transportation Screening Complete  PCP or Specialist Appt within 5-7 Days Complete  Home Care Screening Complete  Medication Review (RN CM) Complete  Some recent data might be hidden

## 2021-02-18 NOTE — Progress Notes (Signed)
NAME:  Anthony Boy., MRN:  [id number], DOB:  1950-11-26, LOS: 6 ADMISSION DATE:  02/12/2021, CONSULTATION DATE:  2/10 REFERRING MD:  Dr. Tana Coast, CHIEF COMPLAINT:  SOB   History of Present Illness:  71 y/o M who presented to [facility] ER on 2/5 with reports of SOB and low oxygen saturations.   The patient resides at [facility].  He tested positive for COVID on 1/27 and began having respiratory difficulties on 1/29.  EMS was called for evaluation on 2/5 and found the patient to have saturations of 78% on arrival.  He reportedly had been experiencing multiple falls at the facility after left AKA.  Per notes, his baseline at the facility is independent ambulation, self performs ADL's without assistance, no hx of memory issues.   On ER arrival, the patient was requiring O2 but sats had improved.  He was evaluated with a CT of the chest to rule out PE which was negative for PE but showed bilateral paraseptal emphysema, bilateral peripheral chronic interstitial lung disease, pulmonary nodules, multiple bilateral rib fractures.  The patient required 15L salter O2 and intermittent use of NRB when sleeping.  PCT negative.  H was treated with remdesivir, oxygen, & solumedrol. The patients O2 needs remained persistently elevated.  He was diuresed with lasix.  The patient elected for DNI status on admission.    PCCM consulted 2/10 for evaluation of hypoxic respiratory failure.   Pulmonary hx: began smoking age 5, smoked up to 2ppd at his heaviest, quit smoking 1 year ago (2022) when he went into the SNF after his amputation.  Reports he is supposed to be on oxygen at night but the facility does provide it for him.  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   Pertinent  Medical History  Bipolar Depression / Anxiety  HTN HLD CAD s/p MI 2013 Nocturnal Hypoxemia - seen on sleep study 2020, no apnea  Tobacco Abuse  COPD - reported, no formal PFT's on file  DM - Hgb A1c  7.7 02/12/21  Hx DVT - traumatic, s/p coumadin therapy  Vitamin D deficiency  Urinary Frequency  Left AKA - in 2022  Significant Hospital Events: Including procedures, antibiotic start and stop dates in addition to other pertinent events   2/5 Admit  2/10 PCCM consulted for hypoxic respiratory failure   Interim History / Subjective:  Remains tired, unmotivated. Diuresed well.  Objective   Blood pressure (!) 82/31, pulse 71, temperature 97.8 F (36.6 C), temperature source Oral, resp. rate 18, height 5\' 9"  (1.753 m), weight 87.2 kg, SpO2 94 %.        Intake/Output Summary (Last 24 hours) at 02/18/2021 0947 Last data filed at 02/18/2021 0800 Gross per 24 hour  Intake 588.96 ml  Output 3000 ml  Net -2411.04 ml    Filed Weights   02/12/21 1823  Weight: 87.2 kg    Examination: Tired, no distress Sats high 80s on 12LPM Lungs diminished bases Bedside US: no appreciable effusion left base, R side with minimal inspiratory motion c/w diaphragm dysfunction RLE trace edema Muscular deconditoning Seems depressed/anhedonic    Resolved Hospital Problem list      Assessment & Plan:   Acute hypoxemic respiratory failure- COVID on top of what looks like R hemidiaphragm dysfunction/paralysis and UIP.  He denies breathlessness prior to current illnesss.  He has been diuresed, doubt has much more fluid but can try another dose of lasix today. Pulmonary nodules  POA Hx of prior DVT Hx tobacco abuse DM2 with hyperglycemia Hx CAD Hx PVD s/p L AKA  - He needs to use incentive spirometer, this works better than any of the medicines we are giving him.  I see no spirometer at bedside.  Asked RN to start documenting if he uses the spirometer and what cc he gets to. - 40mg  lasix x 1 - Check LE duplex, continue xarelto -  IV methylprednisolone to PO prednisone to help with sugars - My suspicion is this man has had longstanding hypoxemia and we may need lower targets for his SpO2.  On his  split night study in 2020 sats were 92% on RA at baseline. - Will follow with you until O2 needs more reasonable - Will need OP pulmonary f/u for nodules and UIP +/- emphysema overlap.  Best Practice (right click and "Reselect all SmartList Selections" daily)  Per Primary    Erskine Emery MD PCCM

## 2021-02-18 NOTE — Progress Notes (Signed)
RT NOTE:  CPT held at this time.

## 2021-02-19 ENCOUNTER — Inpatient Hospital Stay (HOSPITAL_COMMUNITY): Payer: Medicare Other

## 2021-02-19 DIAGNOSIS — F319 Bipolar disorder, unspecified: Secondary | ICD-10-CM

## 2021-02-19 DIAGNOSIS — J9601 Acute respiratory failure with hypoxia: Secondary | ICD-10-CM | POA: Diagnosis not present

## 2021-02-19 DIAGNOSIS — J418 Mixed simple and mucopurulent chronic bronchitis: Secondary | ICD-10-CM

## 2021-02-19 DIAGNOSIS — I2510 Atherosclerotic heart disease of native coronary artery without angina pectoris: Secondary | ICD-10-CM

## 2021-02-19 LAB — CBC
HCT: 53.2 % — ABNORMAL HIGH (ref 39.0–52.0)
Hemoglobin: 17.8 g/dL — ABNORMAL HIGH (ref 13.0–17.0)
MCH: 29.8 pg (ref 26.0–34.0)
MCHC: 33.5 g/dL (ref 30.0–36.0)
MCV: 89 fL (ref 80.0–100.0)
Platelets: 298 10*3/uL (ref 150–400)
RBC: 5.98 MIL/uL — ABNORMAL HIGH (ref 4.22–5.81)
RDW: 12.4 % (ref 11.5–15.5)
WBC: 19.8 10*3/uL — ABNORMAL HIGH (ref 4.0–10.5)
nRBC: 0 % (ref 0.0–0.2)

## 2021-02-19 LAB — GLUCOSE, CAPILLARY
Glucose-Capillary: 192 mg/dL — ABNORMAL HIGH (ref 70–99)
Glucose-Capillary: 284 mg/dL — ABNORMAL HIGH (ref 70–99)
Glucose-Capillary: 224 mg/dL — ABNORMAL HIGH (ref 70–99)
Glucose-Capillary: 236 mg/dL — ABNORMAL HIGH (ref 70–99)

## 2021-02-19 LAB — PROCALCITONIN: Procalcitonin: <0.1 ng/mL

## 2021-02-19 MED ORDER — BENZONATATE 100 MG PO CAPS
200.0000 mg | ORAL_CAPSULE | Freq: Three times a day (TID) | ORAL | Status: DC
  Administered 2021-02-19 – 2021-03-03 (×34): 200 mg via ORAL
  Filled 2021-02-19 (×36): qty 2

## 2021-02-19 MED ORDER — HYDROXYZINE HCL 25 MG PO TABS
25.0000 mg | ORAL_TABLET | Freq: Once | ORAL | Status: AC
  Administered 2021-02-19: 25 mg via ORAL
  Filled 2021-02-19: qty 1

## 2021-02-19 MED ORDER — FUROSEMIDE 10 MG/ML IJ SOLN
40.0000 mg | Freq: Once | INTRAMUSCULAR | Status: AC
  Administered 2021-02-19: 40 mg via INTRAVENOUS
  Filled 2021-02-19: qty 4

## 2021-02-19 MED ORDER — INSULIN GLARGINE-YFGN 100 UNIT/ML ~~LOC~~ SOLN
26.0000 [IU] | Freq: Every day | SUBCUTANEOUS | Status: DC
  Administered 2021-02-19 – 2021-02-27 (×9): 26 [IU] via SUBCUTANEOUS
  Filled 2021-02-19 (×11): qty 0.26

## 2021-02-19 NOTE — Progress Notes (Signed)
Upon assessment, when asked if he needs anything he stated, "Yeah, for depression." When further evaluating he states he does feel anxious and depressed. Flat affect noted. He is alert to person/place/month and year. Intermittently will take off his O2 and sats will decrease to 70s. Easily redirectable.  Notified Jeannette Corpus, NP.

## 2021-02-19 NOTE — Progress Notes (Signed)
NAME:  Anthony Paradiso., MRN:  [id number], DOB:  [birth year], LOS: 7 ADMISSION DATE:  02/12/2021, CONSULTATION DATE:  2/10 REFERRING MD:  Dr. Tana Coast, CHIEF COMPLAINT:  SOB   History of Present Illness:  71 y/o M who presented to [facility] ER on 2/5 with reports of SOB and low oxygen saturations.   The patient resides at [facility].  He tested positive for COVID on 1/27 and began having respiratory difficulties on 1/29.  EMS was called for evaluation on 2/5 and found the patient to have saturations of 78% on arrival.  He reportedly had been experiencing multiple falls at the facility after left AKA.  Per notes, his baseline at the facility is independent ambulation, self performs ADL's without assistance, no hx of memory issues.   On ER arrival, the patient was requiring O2 but sats had improved.  He was evaluated with a CT of the chest to rule out PE which was negative for PE but showed bilateral paraseptal emphysema, bilateral peripheral chronic interstitial lung disease, pulmonary nodules, multiple bilateral rib fractures.  The patient required 15L salter O2 and intermittent use of NRB when sleeping.  PCT negative.  H was treated with remdesivir, oxygen, & solumedrol. The patients O2 needs remained persistently elevated.  He was diuresed with lasix.  The patient elected for DNI status on admission.    PCCM consulted 2/10 for evaluation of hypoxic respiratory failure.   Pulmonary hx: began smoking age 32, smoked up to 2ppd at his heaviest, quit smoking 1 year ago (2022) when he went into the SNF after his amputation.  Reports he is supposed to be on oxygen at night but the facility does provide it for him.  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   Pertinent  Medical History  Bipolar Depression / Anxiety  HTN HLD CAD s/p MI 2013 Nocturnal Hypoxemia - seen on sleep study 2020, no apnea  Tobacco Abuse  COPD - reported, no formal PFT's on file  DM - Hgb A1c  7.7 02/12/21  Hx DVT - traumatic, s/p coumadin therapy  Vitamin D deficiency  Urinary Frequency  Left AKA - in 2022  Significant Hospital Events: Including procedures, antibiotic start and stop dates in addition to other pertinent events   2/5 Admit  2/10 PCCM consulted for hypoxic respiratory failure  2/11 LE duplex with extensive R DVT  Interim History / Subjective:  Coughing fit leading to increased O2 needs this AM.  Objective   Blood pressure 127/83, pulse 77, temperature 98.9 F (37.2 C), temperature source Oral, resp. rate (!) 24, height 5\' 9"  (1.753 m), weight 87.2 kg, SpO2 91 %.    FiO2 (%):  [100 %] 100 %   Intake/Output Summary (Last 24 hours) at 02/19/2021 3785 Last data filed at 02/19/2021 0600 Gross per 24 hour  Intake 1218.96 ml  Output 3100 ml  Net -1881.04 ml    Filed Weights   02/12/21 1823  Weight: 87.2 kg    Examination: More awake this AM Reduced breath sounds on R Abdomen soft No ext edema Moves all 4 ext to command Answering questions appropriately    Resolved Hospital Problem list      Assessment & Plan:   Acute hypoxemic respiratory failure- COVID and likely pulmonary micro-emboli on top of R hemidiaphragm dysfunction/paralysis and underlying UIP.  He denies breathlessness prior to current illnesss.  Can push diuresis but not sure how much left to give. Pulmonary  nodules POA Hx of prior DVT Hx tobacco abuse DM2 with hyperglycemia Hx CAD Hx PVD s/p L AKA  - Push spirometry - 40mg  lasix x 1 again - Continue PO prednisone - BIPAP qHS if he is willing - Continue eliquis with presumed xarelto failure - Will need OP pulmonary f/u for nodules and UIP +/- emphysema overlap. - Will follow while O2 needs are so high  Best Practice (right click and "Reselect all SmartList Selections" daily)  Per Primary    Erskine Emery MD PCCM

## 2021-02-19 NOTE — Progress Notes (Signed)
Pt refusing BIPAP at this time.  Pt states he is unable to sleep with it.  Pt on 15 LPM [place] and tolerating well at this time.

## 2021-02-19 NOTE — TOC Progression Note (Signed)
Transition of Care (TOC) - Progression Note    Patient Details  Name: Anthony [name]. MRN: [id number] Date of Birth: 05/31/1950  Transition of Care [facility]) CM/SW Contact  Ross Ludwig,  Phone Number: 02/19/2021, 1:14 PM  Clinical Narrative:     Patient is LTC at [facility].  CSW was informed by [employer] company that he has been approved for SNF placement at [facility].  Plan Auth ID [id number], Blaine Hamper id [id number].  Patient's next review is 02/22/21, CSW updated [employer].   Expected Discharge Plan: [place] Barriers to Discharge: Continued Medical Work up  Expected Discharge Plan and Services Expected Discharge Plan: [place] In-house Referral: Clinical Social Work   Post Acute Care Choice: [place] Living arrangements for the past 2 months: [place]                 DME Arranged: N/A DME Agency: NA                   Social Determinants of Health (SDOH) Interventions    Readmission Risk Interventions Readmission Risk Prevention Plan 02/13/2021  Transportation Screening Complete  PCP or Specialist Appt within 5-7 Days Complete  Home Care Screening Complete  Medication Review (RN CM) Complete  Some recent data might be hidden

## 2021-02-19 NOTE — Progress Notes (Signed)
[facility]                                                                               Anthony [name], is a 71 y.o. male, DOB - 08-09-1950, OHY:[id number] Admit date - 02/12/2021    Outpatient Primary MD for the patient is Anthony Nutting, DO  LOS - 7  days    Brief summary   Anthony [name] is a 71 yo male with PMH CAD, PAD, HLD, COPD, bipolar, DM II who presented with worsening dyspnea at home.  He resides at a skilled nursing facility and was recently diagnosed with [place] outpatient around approximately 02/03/2021.  Due to becoming hypoxic, he was sent to the ER for further evaluation. He also underwent left AKA on 09/21/2020 and has had recurrent falls since. He was positive for COVID-19 on evaluation in the ER as well as significantly hypoxic requiring nonrebreather.  He was started on remdesivir, steroids, and breathing treatments. He was also evaluated for trauma from his falls and found to have multiple rib fractures bilaterally.   Assessment & Plan    Assessment and Plan: * Acute respiratory failure with hypoxia ([place])- (present on admission) - Chronic underlying lung disease, states he is on O2 at home. CTA chest shows bilateral paraseptal emphysema with chronic ILD.  Also has multiple pulmonary nodules.  No PE. -Etiology for worsening hypoxia felt due to exacerbation of underlying COPD as well as superimposed COVID infection - Difficult to wean O2 down, BiPAP if needed, pulmonology following  HTN (hypertension) - BP stable  History of DVT (deep vein thrombosis) Patient was on Xarelto prior to admission. -Venous Doppler showed a new DVT in the right lower extremity from proximal femoral to the popliteal and in single posterior tibial vein.   Also has bilateral arterial disease with occlusion of the proximal-distal LT SFA and a focal dilitation of the proximal RT SFA to 2.5 cm -Patient has known history of PAD.  Placed on therapeutic eliquis.    Rib fractures -  Likely due to recurrent falls after his left AKA - CTA chest shows left 3rd - 5th and left 5th and 9th rib fractures -Continue incentive spirometry   Bipolar 1 disorder ([place]) - continue mirtazapine, no acute issues  Hypokalemia - Replete PRN  COVID-19 virus infection - Reportedly diagnosed approximately 1 week prior to admission but unclear if he has been treated - Given worsening oxygen demands and several comorbidities, completed remdesivir on 2/9  -Continue p.o. prednisone, Pulmicort, Tessalon, has received Lasix IV with negative balance of 10 L   Multiple lung nodules- (present on admission) - Appears to be known from 2020 - CTA chest shows 6 mm left lower lobe, 3 and 5 mm right middle lobe - appears stable/smaller from prior imagining, continue outpatient monitoring -Will need outpatient pulmonology follow-up   DM2 (diabetes mellitus, type 2) (HCC) - A1c 7.7% on admission - CBGs elevated likely due to steroids  -Increased Semglee to 26 units at bedtime, NovoLog 8 units 3 times daily AC, continue resistant SSI.    COPD (chronic obstructive pulmonary disease) ([place])- (present on admission) - Likely has mild exacerbation in the setting of ongoing COVID  infection  -O2 sats 84 to 94% on 15 L HFNC, difficult to wean O2 down.  -Coughing a lot today, x-ray showed bibasilar left greater than right areas of atelectasis or consolidation -Procalcitonin less than 0.1 -Appreciate pulmonology recommendations  Hyperlipidemia associated with type 2 diabetes mellitus ([place])- (present on admission) - Continue Lipitor  CAD (coronary artery disease)- (present on admission) - Continue aspirin, statin, Xarelto -BP soft, mild bradycardia, continue to hold beta-blocker    Pressure Injury Documentation: Pressure Injury 02/12/21 Buttocks Medial Stage 2 -  Partial thickness loss of dermis presenting as a shallow open injury with a red, pink wound bed without slough. (Active)  02/12/21 1830   Location: Buttocks  Location Orientation: Medial  Staging: Stage 2 -  Partial thickness loss of dermis presenting as a shallow open injury with a red, pink wound bed without slough.  Wound Description (Comments):   Present on Admission: Yes     Code Status: Partial DVT Prophylaxis:   apixaban (ELIQUIS) tablet 10 mg  apixaban (ELIQUIS) tablet 5 mg   Level of Care: Level of care: Stepdown Family Communication:  Disposition Plan:     Remains inpatient appropriate: On 15 L O2 HFNC, difficult to wean  Procedures:  Venous Doppler 2/11  Consultants:   Pulmonary critical care  Antimicrobials:  IV remdesivir 02/12/2021--- 02/16/2021  Medications  Scheduled Meds:  apixaban  10 mg Oral BID   Followed by   Derrill Memo ON 02/25/2021] apixaban  5 mg Oral BID   vitamin C  500 mg Oral Daily   aspirin EC  81 mg Oral Daily   atorvastatin  40 mg Oral Daily   benzonatate  200 mg Oral TID   budesonide (PULMICORT) nebulizer solution  0.5 mg Nebulization BID   Chlorhexidine Gluconate Cloth  6 each Topical Q0600   feeding supplement (GLUCERNA SHAKE)  237 mL Oral BID BM   gabapentin  100 mg Oral BID   guaiFENesin  1,200 mg Oral BID   insulin aspart  0-20 Units Subcutaneous TID WC   insulin aspart  0-5 Units Subcutaneous QHS   insulin aspart  8 Units Subcutaneous TID WC   insulin glargine-yfgn  26 Units Subcutaneous QHS   ipratropium-albuterol  3 mL Nebulization TID   mouth rinse  15 mL Mouth Rinse BID   mirtazapine  30 mg Oral QHS   multivitamin with minerals  1 tablet Oral Daily   predniSONE  40 mg Oral Q breakfast   Ensure Max Protein  11 oz Oral Daily   zinc sulfate  220 mg Oral Daily   Continuous Infusions:  sodium chloride Stopped (02/17/21 0854)   PRN Meds:.sodium chloride, acetaminophen **OR** acetaminophen, chlorpheniramine-HYDROcodone    Subjective:   [name] was seen and examined today.  Coughing a lot today, no fevers or chills.  Still has shortness of breath, no chest  pain.  No nausea vomiting or abdominal pain.    Objective:   Vitals:   02/19/21 0842 02/19/21 0843 02/19/21 0900 02/19/21 1000  BP:   118/72 130/66  Pulse:   74 77  Resp:   (!) 21 20  Temp:      TempSrc:      SpO2: (!) 87% (!) 83% 94% 95%  Weight:      Height:        Intake/Output Summary (Last 24 hours) at 02/19/2021 1206 Last data filed at 02/19/2021 0954 Gross per 24 hour  Intake 720 ml  Output 1600 ml  Net -880 ml  Filed Weights   02/12/21 1823  Weight: 87.2 kg     Exam General: Alert and oriented x 3, NAD Cardiovascular: S1 S2 auscultated, RRR Respiratory: Diminished breath sounds R>L Gastrointestinal: Soft, nontender, nondistended, + bowel sounds Ext: no pedal edema bilaterally Neuro: no new deficits, moving all 4 extremities Psych: flat affect   Data Reviewed:  I have personally reviewed following labs and imaging studies   CBC Lab Results  Component Value Date   WBC 19.8 (H) 02/19/2021   RBC 5.98 (H) 02/19/2021   HGB 17.8 (H) 02/19/2021   HCT 53.2 (H) 02/19/2021   MCV 89.0 02/19/2021   MCH 29.8 02/19/2021   PLT 298 02/19/2021   MCHC 33.5 02/19/2021   RDW 12.4 02/19/2021   LYMPHSABS 0.7 02/17/2021   MONOABS 1.1 (H) 02/17/2021   EOSABS 0.0 02/17/2021   BASOSABS 0.1 08/81/1031     Last metabolic panel Lab Results  Component Value Date   NA 134 (L) 02/17/2021   K 4.8 02/17/2021   CL 101 02/17/2021   CO2 26 02/17/2021   BUN 37 (H) 02/17/2021   CREATININE 0.84 02/17/2021   GLUCOSE 379 (H) 02/17/2021   GFRNONAA >60 02/17/2021   GFRAA 84 10/30/2019   CALCIUM 8.4 (L) 02/17/2021   PROT 6.3 (L) 02/17/2021   ALBUMIN 3.1 (L) 02/17/2021   LABGLOB 2.9 07/02/2018   AGRATIO 1.6 07/02/2018   BILITOT 0.4 02/17/2021   ALKPHOS 56 02/17/2021   AST 16 02/17/2021   ALT 24 02/17/2021   ANIONGAP 7 02/17/2021    CBG (last 3)  Recent Labs    02/18/21 1656 02/18/21 2109 02/19/21 0755  GLUCAP 298* 277* 192*        Radiology Studies: DG CHEST  PORT 1 VIEW  Result Date: 02/19/2021 IMPRESSION: 1. Low lung volumes with bibasilar (left-greater-than-right) areas of atelectasis and/or consolidation. 2. Small left pleural effusion. 3. Aortic atherosclerosis. Electronically Signed   By: [name] M.D.   On: 02/19/2021 08:40    VAS [place] LOWER EXTREMITY VENOUS (DVT) Summary: RIGHT: - Findings consistent with age indeterminate deep vein thrombosis involving the right femoral vein, right popliteal vein, and right posterior tibial veins. - No cystic structure found in the popliteal fossa. 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  LEFT: - No evidence of common femoral vein obstruction. Patent femoral vein and profunda femoral vein. 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  *See table(s) above for measurements and observations. Electronically signed by Jamelle Haring on 02/19/2021 at 10:40:46 AM.    Final        Estill Cotta M.D. [facility] 02/19/2021, 12:06 PM  Available via Epic secure chat 7am-7pm After 7 pm, please refer to night coverage provider listed on amion.

## 2021-02-20 ENCOUNTER — Other Ambulatory Visit (HOSPITAL_COMMUNITY): Payer: Medicare Other

## 2021-02-20 ENCOUNTER — Inpatient Hospital Stay (HOSPITAL_COMMUNITY): Payer: Medicare Other

## 2021-02-20 DIAGNOSIS — J418 Mixed simple and mucopurulent chronic bronchitis: Secondary | ICD-10-CM | POA: Diagnosis not present

## 2021-02-20 DIAGNOSIS — F319 Bipolar disorder, unspecified: Secondary | ICD-10-CM | POA: Diagnosis not present

## 2021-02-20 DIAGNOSIS — J9601 Acute respiratory failure with hypoxia: Secondary | ICD-10-CM | POA: Diagnosis not present

## 2021-02-20 DIAGNOSIS — I2510 Atherosclerotic heart disease of native coronary artery without angina pectoris: Secondary | ICD-10-CM | POA: Diagnosis not present

## 2021-02-20 LAB — CBC
HCT: 51.4 % (ref 39.0–52.0)
Hemoglobin: 17.3 g/dL — ABNORMAL HIGH (ref 13.0–17.0)
MCH: 29.8 pg (ref 26.0–34.0)
MCHC: 33.7 g/dL (ref 30.0–36.0)
MCV: 88.6 fL (ref 80.0–100.0)
Platelets: 290 10*3/uL (ref 150–400)
RBC: 5.8 MIL/uL (ref 4.22–5.81)
RDW: 12.5 % (ref 11.5–15.5)
WBC: 15 10*3/uL — ABNORMAL HIGH (ref 4.0–10.5)
nRBC: 0 % (ref 0.0–0.2)

## 2021-02-20 LAB — BASIC METABOLIC PANEL
Anion gap: 7 (ref 5–15)
BUN: 34 mg/dL — ABNORMAL HIGH (ref 8–23)
CO2: 33 mmol/L — ABNORMAL HIGH (ref 22–32)
Calcium: 8.5 mg/dL — ABNORMAL LOW (ref 8.9–10.3)
Chloride: 96 mmol/L — ABNORMAL LOW (ref 98–111)
Creatinine, Ser: 0.64 mg/dL (ref 0.61–1.24)
GFR, Estimated: >60 mL/min (ref >60)
Glucose, Bld: 161 mg/dL — ABNORMAL HIGH (ref 70–99)
Potassium: 3.4 mmol/L — ABNORMAL LOW (ref 3.5–5.1)
Sodium: 136 mmol/L (ref 135–145)

## 2021-02-20 LAB — GLUCOSE, CAPILLARY
Glucose-Capillary: 131 mg/dL — ABNORMAL HIGH (ref 70–99)
Glucose-Capillary: 218 mg/dL — ABNORMAL HIGH (ref 70–99)
Glucose-Capillary: 333 mg/dL — ABNORMAL HIGH (ref 70–99)
Glucose-Capillary: 204 mg/dL — ABNORMAL HIGH (ref 70–99)

## 2021-02-20 LAB — MAGNESIUM: Magnesium: 2.2 mg/dL (ref 1.7–2.4)

## 2021-02-20 MED ORDER — METOPROLOL TARTRATE 25 MG PO TABS
25.0000 mg | ORAL_TABLET | Freq: Two times a day (BID) | ORAL | Status: DC
  Administered 2021-02-20 – 2021-03-03 (×20): 25 mg via ORAL
  Filled 2021-02-20 (×22): qty 1

## 2021-02-20 MED ORDER — POTASSIUM CHLORIDE CRYS ER 20 MEQ PO TBCR
40.0000 meq | EXTENDED_RELEASE_TABLET | Freq: Once | ORAL | Status: AC
Start: 2021-02-20 — End: 2021-02-20
  Administered 2021-02-20: 40 meq via ORAL
  Filled 2021-02-20: qty 2

## 2021-02-20 MED ORDER — MAGNESIUM SULFATE 2 GM/50ML IV SOLN
2.0000 g | Freq: Once | INTRAVENOUS | Status: AC
  Administered 2021-02-20: 2 g via INTRAVENOUS
  Filled 2021-02-20: qty 50

## 2021-02-20 MED ORDER — POTASSIUM CHLORIDE CRYS ER 20 MEQ PO TBCR
40.0000 meq | EXTENDED_RELEASE_TABLET | Freq: Once | ORAL | Status: AC
  Administered 2021-02-20: 40 meq via ORAL
  Filled 2021-02-20: qty 2

## 2021-02-20 MED ORDER — ESCITALOPRAM OXALATE 10 MG PO TABS
10.0000 mg | ORAL_TABLET | Freq: Every day | ORAL | Status: DC
  Administered 2021-02-20 – 2021-03-02 (×11): 10 mg via ORAL
  Filled 2021-02-20 (×11): qty 1

## 2021-02-20 MED ORDER — ALBUMIN HUMAN 25 % IV SOLN
12.5000 g | Freq: Once | INTRAVENOUS | Status: AC
  Administered 2021-02-20: 12.5 g via INTRAVENOUS
  Filled 2021-02-20: qty 50

## 2021-02-20 MED ORDER — HYDROXYZINE HCL 25 MG PO TABS
25.0000 mg | ORAL_TABLET | Freq: Three times a day (TID) | ORAL | Status: DC | PRN
  Administered 2021-02-20 – 2021-03-01 (×6): 25 mg via ORAL
  Filled 2021-02-20 (×6): qty 1

## 2021-02-20 MED ORDER — FUROSEMIDE 10 MG/ML IJ SOLN
40.0000 mg | Freq: Four times a day (QID) | INTRAMUSCULAR | Status: AC
  Administered 2021-02-20 (×2): 40 mg via INTRAVENOUS
  Filled 2021-02-20 (×2): qty 4

## 2021-02-20 MED ORDER — INSULIN ASPART 100 UNIT/ML IJ SOLN
10.0000 [IU] | Freq: Three times a day (TID) | INTRAMUSCULAR | Status: DC
  Administered 2021-02-20 – 2021-02-28 (×22): 10 [IU] via SUBCUTANEOUS

## 2021-02-20 NOTE — TOC Progression Note (Signed)
Transition of Care (TOC) - Progression Note    Patient Details  Name: Anthony [name]. MRN: [id number] Date of Birth: 1950-10-21  Transition of Care [facility]) CM/SW Contact  Ross Ludwig, [place] Phone Number: 02/20/2021, 7:03 PM  Clinical Narrative:    CSW continuing to follow patient's progress throughout discharge planning.  CSW asked [facility] to [facility] and see if he would be a good candidate.  Waiting for call back from LTACs.     Expected Discharge Plan: [place] Barriers to Discharge: Continued Medical Work up  Expected Discharge Plan and Services Expected Discharge Plan: [place] In-house Referral: Clinical Social Work   Post Acute Care Choice: [place] Living arrangements for the past 2 months: [place]                 DME Arranged: N/A DME Agency: NA                   Social Determinants of Health (SDOH) Interventions    Readmission Risk Interventions Readmission Risk Prevention Plan 02/13/2021  Transportation Screening Complete  PCP or Specialist Appt within 5-7 Days Complete  Home Care Screening Complete  Medication Review (RN CM) Complete  Some recent data might be hidden

## 2021-02-20 NOTE — Progress Notes (Signed)
NAME:  Anthony Blyden., MRN:  [id number], DOB:  [birth year], LOS: 45 ADMISSION DATE:  02/12/2021, CONSULTATION DATE:  2/10 REFERRING MD:  Dr. Tana Coast, CHIEF COMPLAINT:  SOB   History of Present Illness:  71 y/o M who presented to [facility] ER on 2/5 with reports of SOB and low oxygen saturations.   The patient resides at [facility].  He tested positive for COVID on 1/27 and began having respiratory difficulties on 1/29.  EMS was called for evaluation on 2/5 and found the patient to have saturations of 78% on arrival.  He reportedly had been experiencing multiple falls at the facility after left AKA.  Per notes, his baseline at the facility is independent ambulation, self performs ADL's without assistance, no hx of memory issues.   On ER arrival, the patient was requiring O2 but sats had improved.  He was evaluated with a CT of the chest to rule out PE which was negative for PE but showed bilateral paraseptal emphysema, bilateral peripheral chronic interstitial lung disease, pulmonary nodules, multiple bilateral rib fractures.  The patient required 15L [place] O2 and intermittent use of NRB when sleeping.  PCT negative.  H was treated with remdesivir, oxygen, & solumedrol. The patients O2 needs remained persistently elevated.  He was diuresed with lasix.  The patient elected for DNI status on admission.    PCCM consulted 2/10 for evaluation of hypoxic respiratory failure.   Pulmonary hx: began smoking age 71, smoked up to 2ppd at his heaviest, quit smoking 1 year ago (2022) when he went into the SNF after his amputation.  Reports he is supposed to be on oxygen at night but the facility does provide it for him.  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   Pertinent  Medical History  Bipolar Depression / Anxiety  HTN HLD CAD s/p MI 2013 Nocturnal Hypoxemia - seen on sleep study 2020, no apnea  Tobacco Abuse  COPD - reported, no formal PFT's on file  DM - Hgb A1c  7.7 02/12/21  Hx DVT - traumatic, s/p coumadin therapy  Vitamin D deficiency  Urinary Frequency  Left AKA - in 2022  Significant Hospital Events: Including procedures, antibiotic start and stop dates in addition to other pertinent events   2/5 Admit  2/10 PCCM consulted for hypoxic respiratory failure  2/11 LE duplex with extensive R DVT  Interim History / Subjective:  O2 needs still up. Diuresing well. More depressed today.  Objective   Blood pressure 100/65, pulse 74, temperature 97.8 F (36.6 C), temperature source Oral, resp. rate (!) 22, height 5\' 9"  (1.753 m), weight 87.2 kg, SpO2 90 %.        Intake/Output Summary (Last 24 hours) at 02/20/2021 1118 Last data filed at 02/20/2021 8675 Gross per 24 hour  Intake 720 ml  Output 2525 ml  Net -1805 ml    Filed Weights   02/12/21 1823  Weight: 87.2 kg    Examination: No distress Not much edema L AKA Moves ext to command Lungs with crackles unchanged  Resolved Hospital Problem list      Assessment & Plan:   Acute hypoxemic respiratory failure- COVID and likely pulmonary micro-emboli on top of R hemidiaphragm dysfunction/paralysis and underlying UIP.  He denies breathlessness prior to current illnesss.  No real change over last several days. Pulmonary nodules POA Hx of prior DVT Hx tobacco abuse DM2 with hyperglycemia Hx CAD Hx PVD s/p L AKA  Situational depression/anxiety-   - Push spirometry - Albumin + lasix - Continue PO prednisone to complete 10 dyas - BIPAP qHS if he is willing - Continue eliquis with presumed xarelto failure - Will need OP pulmonary f/u for nodules and UIP +/- emphysema overlap. - Start lexapro and PRN atarax - Will follow while O2 needs are so high  Best Practice (right click and "Reselect all SmartList Selections" daily)  Per Primary    Erskine Emery MD PCCM

## 2021-02-20 NOTE — Progress Notes (Signed)
Inpatient Diabetes Program Recommendations  AACE/ADA: New Consensus Statement on Inpatient Glycemic Control (2015)  Target Ranges:  Prepandial:   less than 140 mg/dL      Peak postprandial:   less than 180 mg/dL (1-2 hours)      Critically ill patients:  140 - 180 mg/dL    Latest Reference Range & Units 02/19/21 07:55 02/19/21 12:40 02/19/21 18:16 02/19/21 20:07  Glucose-Capillary 70 - 99 mg/dL 192 (H)  12 units Novolog  284 (H)  19 units Novolog  224 (H)  15 units Novolog  236 (H)  2 units Novolog  26 units Semglee  (H): Data is abnormally high  Latest Reference Range & Units 02/20/21 08:27 02/20/21 11:26  Glucose-Capillary 70 - 99 mg/dL 131 (H)  11 units Novolog  218 (H)  (H): Data is abnormally high   Home DM Meds: Glipizide 10 mg BID (NOT taking)      Metformin 750 mg BID      Semaglutide 7 mg Daily     Current Orders: Semglee 26 units QHS                            Novolog 8 units TID with meals                            Novolog Resistant Correction Scale/ SSI (0-20 units) TID AC + HS    Stopped the Solumedrol (last dose given AM 2/11)  Started Prednisone 40 mg daily yest AM (2/12)  Increased the Semglee to 26 QHS last PM and Increased the Novolog Meal Coverage on 2/10    MD- Please consider increasing the Novolog Meal Coverage to 10 units TID with meals     --Will follow patient during hospitalization--  Wyn Quaker RN, MSN, CDE Diabetes Coordinator Inpatient Glycemic Control Team Team Pager: [phone] (8a-5p)

## 2021-02-20 NOTE — Progress Notes (Signed)
Physical Therapy Treatment Patient Details Name: Anthony [name]. MRN: [id number] DOB: 12/10/1950 Today's Date: 02/20/2021   History of Present Illness Patient is a 71 year old male who resides at [facility] place and admitted for acute respiratory failure 2* COVID. PMH includes L AKA, hx falls    PT Comments    The patient  is lethargic, minimally responds to questions and directions. Patient was not interested in going to recliner. Patient mobilized to sitting on bed edge but propped  on HOB that was elevated. Patient keeping eyes closed throughout session..  Patient initially only on NRB, [place] out of nares, with SPO2 76%. HFNC  at 15 L placed back in nares. SPO2 remained >95%. HR 80  Continue to mobilize as tolerated.  Recommendations for follow up therapy are one component of a multi-disciplinary discharge planning process, led by the attending physician.  Recommendations may be updated based on patient status, additional functional criteria and insurance authorization.  Follow Up Recommendations  Skilled nursing-short term rehab (<3 hours/day)     Assistance Recommended at Discharge Frequent or constant Supervision/Assistance  Patient can return home with the following A lot of help with bathing/dressing/bathroom;A lot of help with walking and/or transfers;Assistance with cooking/housework;Direct supervision/assist for medications management;Assist for transportation;Direct supervision/assist for financial management;Help with stairs or ramp for entrance   Equipment Recommendations  None recommended by PT    Recommendations for Other Services       Precautions / Restrictions Precautions Precautions: Fall Precaution Comments: multiple B rib fractures, Lt AKA, on NRB and HFNC 15 L     Mobility  Bed Mobility Overal bed mobility: Needs Assistance Bed Mobility: Supine to Sit, Sit to Supine     Supine to sit: Min guard Sit to supine: Min guard   General bed mobility comments: pt  taking extra time, HOB elevated, and using bed rail. patient  lethargic, patient  placed right leg over bed edge, never sat self upright, remained with eyes closed through out and propped on HOB, holding rail.    Transfers                   General transfer comment: NT    Ambulation/Gait                   Stairs             Wheelchair Mobility    Modified Rankin (Stroke Patients Only)       Balance Overall balance assessment: History of Falls, Needs assistance Sitting-balance support: Feet unsupported Sitting balance-Leahy Scale: Fair Sitting balance - Comments: propped up on bed x 5 minutes, never satupright so encouraged patient to return to supine. Bed placed in  bed chair paoition.                                    Cognition Arousal/Alertness: Lethargic Behavior During Therapy: Flat affect Overall Cognitive Status: No family/caregiver present to determine baseline cognitive functioning                                 General Comments: patient lethargic not vocal,except states that he  he does not want to get up. ( RN had recently contacted PT saying patient was ready to get to chair.)        Exercises      General  Comments        Pertinent Vitals/Pain Pain Assessment Pain Assessment: No/denies pain Faces Pain Scale: No hurt    Home Living                          Prior Function            PT Goals (current goals can now be found in the care plan section) Progress towards PT goals: Progressing toward goals    Frequency    Min 2X/week      PT Plan Current plan remains appropriate    Co-evaluation              AM-PAC PT "6 Clicks" Mobility   Outcome Measure  Help needed turning from your back to your side while in a flat bed without using bedrails?: A Little Help needed moving from lying on your back to sitting on the side of a flat bed without using bedrails?: A Lot Help  needed moving to and from a bed to a chair (including a wheelchair)?: A Lot Help needed standing up from a chair using your arms (e.g., wheelchair or bedside chair)?: Total Help needed to walk in hospital room?: Total Help needed climbing 3-5 steps with a railing? : Total 6 Click Score: 10    End of Session   Activity Tolerance: Patient limited by fatigue Patient left: in bed;with call bell/phone within reach;with bed alarm set Nurse Communication: Mobility status PT Visit Diagnosis: Unsteadiness on feet (R26.81);Other abnormalities of gait and mobility (R26.89);Muscle weakness (generalized) (M62.81);Difficulty in walking, not elsewhere classified (R26.2)     Time: 1100-1120 PT Time Calculation (min) (ACUTE ONLY): 20 min  Charges:  $Therapeutic Activity: 8-22 mins                     Tresa Endo PT Acute Rehabilitation Services Pager [phone] Office [phone]    Claretha Cooper 02/20/2021, 12:36 PM

## 2021-02-20 NOTE — Progress Notes (Signed)
Patient appeared to go into afib RVR on the monitor when getting up to [facility] with RN, HR up to 150 bpm and irregular. BP WNL. EKG obtained and confirmed afib RVR. Dr Tamala Julian notified by this RN and orders received for IV magnesium and PO metoprolol. HR currently 138 bpm, afib on monitor. Pt denies SOB at this time, but remains on 15L HFNC with NRBM. This RN will continue to carefully monitor HR.

## 2021-02-20 NOTE — Progress Notes (Signed)
Pt pulled CPAP mask off and refuses to put it back on.  Pt resting comfortably on 15Lpm [place] HFNC.  100% non-rebreather mask remains at bedside and on.

## 2021-02-20 NOTE — Progress Notes (Signed)
[facility]                                                                               Anthony [name], is a 71 y.o. male, DOB - 12/13/1950, GEX:[id number] Admit date - 02/12/2021    Outpatient Primary MD for the patient is [name], DO  LOS - 8  days    Brief summary   Anthony [name] is a 71 yo male with PMH CAD, PAD, HLD, COPD, bipolar, DM II who presented with worsening dyspnea at home.  He resides at a skilled nursing facility and was recently diagnosed with Thompson outpatient around approximately 02/03/2021.  Due to becoming hypoxic, he was sent to the ER for further evaluation. He also underwent left AKA on 09/21/2020 and has had recurrent falls since. He was positive for COVID-19 on evaluation in the ER as well as significantly hypoxic requiring nonrebreather.  He was started on remdesivir, steroids, and breathing treatments. He was also evaluated for trauma from his falls and found to have multiple rib fractures bilaterally.  Difficult to wean O2, pulmonology following.   Assessment & Plan    Assessment and Plan: * Acute respiratory failure with hypoxia ([place])- (present on admission) - Chronic underlying lung disease, states he is on O2 at home. CTA chest shows bilateral paraseptal emphysema with chronic ILD.  Also has multiple pulmonary nodules.  No PE. -Etiology for worsening hypoxia felt due to exacerbation of underlying COPD as well as superimposed COVID infection - Obtain 2D echo, started on albumin, Lasix -BiPAP nightly, wean O2 as tolerated  HTN (hypertension) - BP stable  History of DVT (deep vein thrombosis) Patient was on Xarelto prior to admission. -Venous Doppler showed a new DVT in the right lower extremity from proximal femoral to the popliteal and in single posterior tibial vein.   Also has bilateral arterial disease with occlusion of the proximal-distal LT SFA and a focal dilitation of the proximal RT SFA to 2.5 cm -Patient has known history of  PAD.  Placed on therapeutic eliquis.    Rib fractures - Likely due to recurrent falls after his left AKA - CTA chest shows left 3rd - 5th and left 5th and 9th rib fractures -Continue incentive spirometry   Bipolar 1 disorder ([place]) - continue mirtazapine,  -Flat affect, pulmonology starting Lexapro and as needed Atarax  Hypokalemia - Replete PRN  COVID-19 virus infection - Reportedly diagnosed approximately 1 week prior to admission but unclear if he has been treated - Given worsening oxygen demands and several comorbidities, completed remdesivir on 2/9  -On p.o. prednisone, Pulmicort, Lasix, negative balance of 11 L.  Multiple lung nodules- (present on admission) - Appears to be known from 2020 - CTA chest shows 6 mm left lower lobe, 3 and 5 mm right middle lobe - appears stable/smaller from prior imagining, continue outpatient monitoring -Will need outpatient pulmonology follow-up   DM2 (diabetes mellitus, type 2) ([place]) - A1c 7.7% on admission - CBGs elevated due to steroids. -Continue Semglee to 26 units at bedtime, increase NovoLog to 10 units 3 times daily AC, SSI    COPD (chronic obstructive pulmonary disease) ([place])- (present on admission) - Likely  has mild exacerbation in the setting of ongoing COVID infection  --Chest x-ray showed bibasilar left greater than right areas of atelectasis or consolidation -Procalcitonin less than 0.1 -Difficult to wean down O2, appreciate pulmonology recommendations, BiPAP nightly, started on albumin/Lasix  Hyperlipidemia associated with type 2 diabetes mellitus ([place])- (present on admission) - Continue Lipitor  CAD (coronary artery disease)- (present on admission) -Currently on aspirin, statin, eliquis -BP stable, however borderline hence holding beta-blocker   Pressure injury Buttocks medial stage II, POA   Code Status: Partial DVT Prophylaxis:   apixaban (ELIQUIS) tablet 10 mg  apixaban (ELIQUIS) tablet 5 mg   Level of  Care: Level of care: Stepdown Family Communication:  Disposition Plan:     Remains inpatient appropriate: Still on 15 L O2 HFNC  Procedures:  none  Consultants:   pulm  Antimicrobials:   IV remdesivir 02/12/2021---> 02/16/2021 completed   Medications   apixaban  10 mg Oral BID   Followed by   Derrill Memo ON 02/25/2021] apixaban  5 mg Oral BID   vitamin C  500 mg Oral Daily   aspirin EC  81 mg Oral Daily   atorvastatin  40 mg Oral Daily   benzonatate  200 mg Oral TID   budesonide (PULMICORT) nebulizer solution  0.5 mg Nebulization BID   Chlorhexidine Gluconate Cloth  6 each Topical Q0600   escitalopram  10 mg Oral QHS   feeding supplement (GLUCERNA SHAKE)  237 mL Oral BID BM   furosemide  40 mg Intravenous Q6H   gabapentin  100 mg Oral BID   guaiFENesin  1,200 mg Oral BID   insulin aspart  0-20 Units Subcutaneous TID WC   insulin aspart  0-5 Units Subcutaneous QHS   insulin aspart  10 Units Subcutaneous TID WC   insulin glargine-yfgn  26 Units Subcutaneous QHS   ipratropium-albuterol  3 mL Nebulization TID   mouth rinse  15 mL Mouth Rinse BID   mirtazapine  30 mg Oral QHS   multivitamin with minerals  1 tablet Oral Daily   predniSONE  40 mg Oral Q breakfast   Ensure Max Protein  11 oz Oral Daily   zinc sulfate  220 mg Oral Daily     Subjective:   Anthony [name] was seen and examined today.  Cough is better, no acute chest pain, no fevers however still difficult to wean O2.  On 15 L HFNC.  O2 sats borderline.  Denies any nausea vomiting abdominal pain, diarrhea.  Objective:   Vitals:   02/20/21 0934 02/20/21 1000 02/20/21 1100 02/20/21 1200  BP:  (!) 105/59 100/65 109/69  Pulse:  72 74 67  Resp:  (!) 24 (!) 22 (!) 21  Temp:      TempSrc:      SpO2: (!) 87% 90% 90% 94%  Weight:      Height:        Intake/Output Summary (Last 24 hours) at 02/20/2021 1224 Last data filed at 02/20/2021 7412 Gross per 24 hour  Intake 720 ml  Output 2525 ml  Net -1805 ml   Filed  Weights   02/12/21 1823  Weight: 87.2 kg     Exam General: Alert and oriented x 3, NAD Cardiovascular: S1 S2 auscultated, RRR Respiratory: Diminished breath sound at the bases Gastrointestinal: Soft, nontender, nondistended, + bowel sounds Ext: left AKA Neuro: no new deficits  Psych: flat affect   Data Reviewed:  I have personally reviewed following labs    CBC Lab Results  Component  Value Date   WBC 15.0 (H) 02/20/2021   RBC 5.80 02/20/2021   HGB 17.3 (H) 02/20/2021   HCT 51.4 02/20/2021   MCV 88.6 02/20/2021   MCH 29.8 02/20/2021   PLT 290 02/20/2021   MCHC 33.7 02/20/2021   RDW 12.5 02/20/2021   LYMPHSABS 0.7 02/17/2021   MONOABS 1.1 (H) 02/17/2021   EOSABS 0.0 02/17/2021   BASOSABS 0.1 31/49/7026     Last metabolic panel Lab Results  Component Value Date   NA 136 02/20/2021   K 3.4 (L) 02/20/2021   CL 96 (L) 02/20/2021   CO2 33 (H) 02/20/2021   BUN 34 (H) 02/20/2021   CREATININE 0.64 02/20/2021   GLUCOSE 161 (H) 02/20/2021   GFRNONAA >60 02/20/2021   GFRAA 84 10/30/2019   CALCIUM 8.5 (L) 02/20/2021   PROT 6.3 (L) 02/17/2021   ALBUMIN 3.1 (L) 02/17/2021   LABGLOB 2.9 07/02/2018   AGRATIO 1.6 07/02/2018   BILITOT 0.4 02/17/2021   ALKPHOS 56 02/17/2021   AST 16 02/17/2021   ALT 24 02/17/2021   ANIONGAP 7 02/20/2021    CBG (last 3)  Recent Labs    02/19/21 2007 02/20/21 0827 02/20/21 1126  GLUCAP 236* 131* 218*      Coagulation Profile: No results for input(s): INR, PROTIME in the last 168 hours.   Radiology Studies: I have personally reviewed the imaging studies  DG Chest 1 View  Result Date: 02/20/2021 CLINICAL DATA:  Hypoxia with history of COVID. EXAM: CHEST  1 VIEW COMPARISON:  Portable chest yesterday at 8:20 a.m. FINDINGS: 4:50 a.m., 02/20/2021. The heart is enlarged. There is increased central vascular prominence. There is increased interstitial consolidation in the hypoinflated lower lung fields consistent with edema. Findings  may suspect CHF or fluid overload. The lungs are expiratory with small left pleural effusion noted and increased left basilar opacity, compatible with atelectasis or consolidation. No significant right pleural collection is seen. The upper lung fields remain clear.  No other changes are noted. There is aortic atherosclerosis and tortuosity.  Stable mediastinum. IMPRESSION: 1. Increased central vascular congestion and lower zonal interstitial edema. 2. Persistent left pleural effusion with increased left basilar opacity, atelectasis versus pneumonic consolidation. 3. Aortic atherosclerosis. Electronically Signed   By: Telford Nab M.D.   On: 02/20/2021 06:23   DG CHEST PORT 1 VIEW  Result Date: 02/19/2021 CLINICAL DATA:  71 year old male with history of hypoxia. EXAM: PORTABLE CHEST 1 VIEW COMPARISON:  Chest x-ray 02/18/2021. FINDINGS: Lung volumes remain low. Opacities in the lung bases (left-greater-than-right) may reflect areas of atelectasis and/or consolidation. Small left pleural effusion. No pneumothorax. No suspicious appearing pulmonary nodules or masses are noted. No evidence of pulmonary edema. Heart size is normal. Upper mediastinal contours are within normal limits. Atherosclerotic calcifications are noted in the thoracic aorta. IMPRESSION: 1. Low lung volumes with bibasilar (left-greater-than-right) areas of atelectasis and/or consolidation. 2. Small left pleural effusion. 3. Aortic atherosclerosis. Electronically Signed   By: [name] M.D.   On: 02/19/2021 08:40       Emeree Mahler M.D. [facility] 02/20/2021, 12:24 PM  Available via Epic secure chat 7am-7pm After 7 pm, please refer to night coverage provider listed on amion.

## 2021-02-20 NOTE — Progress Notes (Signed)
Nutrition Follow-up  DOCUMENTATION CODES:   Not applicable  INTERVENTION:  - continue Glucerna Shake BID and Ensure Max once/day. - discussed with RN and she will weigh patient this shift.    NUTRITION DIAGNOSIS:   Increased nutrient needs related to acute illness, catabolic illness ([id number] infection) as evidenced by estimated needs. -ongoing  GOAL:   Patient will meet greater than or equal to 90% of their needs -met   MONITOR:   PO intake, Supplement acceptance, Labs, Weight trends  ASSESSMENT:   71 y.o. male with medical history of CAD, PAD, HLD, COPD, bipolar disorder, and DM. he presented to the ED with dyspnea, hypoxia at a facility, and outpatient dx of COVID-19 (around 02/03/21). He had L AKA on 09/21/20 and his daughter reported he has had multiple falls since that time.  Per flow sheet documentation, patient has mainly been eating 100%, including 100% of breakfast this AM. Per review of orders, he has been accepting Glucerna and Ensure Max 100% of the time offered.  Able to talk with RN who confirms that patient is eating well and that he drank 100% of Glucerna provided to him this morning.   He has not been weighed since admission on 2/5. He is noted to be -11.88 L since admission.    Labs reviewed; CBGs: 131 and 218 mg/dl, K: 3.4 mmol/l, Cl: 96 mmol/l, BUN: 34 mg/dl, Ca: 8.5 mg/dl.   Medications reviewed; 500 mg ascorbic acid/day, 40 mg IV lasix x2 doses 2/13, sliding scale novolog, 8 units novolog TID, 26 units semglee/day, 1 table multivitamin with minerals/day, 40 mEq Klor-Con x2 doses, 40 mg deltasone/day, 220 mg zinc sulfate/day.    Diet Order:   Diet Order             Diet Carb Modified Fluid consistency: Thin; Room service appropriate? Yes  Diet effective now                   EDUCATION NEEDS:   No education needs have been identified at this time  Skin:  Skin Assessment: Skin Integrity Issues: Skin Integrity Issues:: Stage II Stage II:  buttocks  Last BM:  2/10 (type 4, large amount)  Height:   Ht Readings from Last 1 Encounters:  02/12/21 _0  (1.753 m)    Weight:   Wt Readings from Last 1 Encounters:  02/12/21 87.2 kg     BMI:  Body mass index is 28.39 kg/m.   Estimated Nutritional Needs:  Kcal:  1900-2100 kcal Protein:  105-120 grams Fluid:  >/= 2.3 L/day     Jarome Matin, MS, RD, LDN Inpatient Clinical Dietitian RD pager # available in [place]  After hours/weekend pager # available in [facility]

## 2021-02-21 ENCOUNTER — Encounter (HOSPITAL_COMMUNITY): Payer: Self-pay | Admitting: Internal Medicine

## 2021-02-21 ENCOUNTER — Other Ambulatory Visit (HOSPITAL_COMMUNITY): Payer: Medicare Other

## 2021-02-21 ENCOUNTER — Inpatient Hospital Stay (HOSPITAL_COMMUNITY): Payer: Medicare Other

## 2021-02-21 DIAGNOSIS — J9601 Acute respiratory failure with hypoxia: Secondary | ICD-10-CM | POA: Diagnosis not present

## 2021-02-21 DIAGNOSIS — F319 Bipolar disorder, unspecified: Secondary | ICD-10-CM | POA: Diagnosis not present

## 2021-02-21 DIAGNOSIS — I2510 Atherosclerotic heart disease of native coronary artery without angina pectoris: Secondary | ICD-10-CM | POA: Diagnosis not present

## 2021-02-21 DIAGNOSIS — J418 Mixed simple and mucopurulent chronic bronchitis: Secondary | ICD-10-CM | POA: Diagnosis not present

## 2021-02-21 DIAGNOSIS — I480 Paroxysmal atrial fibrillation: Secondary | ICD-10-CM

## 2021-02-21 LAB — BASIC METABOLIC PANEL
Anion gap: 6 (ref 5–15)
BUN: 43 mg/dL — ABNORMAL HIGH (ref 8–23)
CO2: 32 mmol/L (ref 22–32)
Calcium: 8.8 mg/dL — ABNORMAL LOW (ref 8.9–10.3)
Chloride: 99 mmol/L (ref 98–111)
Creatinine, Ser: 0.74 mg/dL (ref 0.61–1.24)
GFR, Estimated: >60 mL/min (ref >60)
Glucose, Bld: 138 mg/dL — ABNORMAL HIGH (ref 70–99)
Potassium: 3.7 mmol/L (ref 3.5–5.1)
Sodium: 137 mmol/L (ref 135–145)

## 2021-02-21 LAB — ECHOCARDIOGRAM COMPLETE BUBBLE STUDY
AR max vel: 4.28 cm2
AV Area VTI: 4.39 cm2
AV Area mean vel: 4.17 cm2
AV Mean grad: 2.5 mmHg
AV Peak grad: 4.5 mmHg
Ao pk vel: 1.06 m/s
Area-P 1/2: 3.08 cm2
Calc EF: 66 %
MV VTI: 4.14 cm2
S' Lateral: 3.7 cm
Single Plane A2C EF: 63.1 %
Single Plane A4C EF: 65.5 %

## 2021-02-21 LAB — GLUCOSE, CAPILLARY
Glucose-Capillary: 183 mg/dL — ABNORMAL HIGH (ref 70–99)
Glucose-Capillary: 135 mg/dL — ABNORMAL HIGH (ref 70–99)
Glucose-Capillary: 182 mg/dL — ABNORMAL HIGH (ref 70–99)
Glucose-Capillary: 181 mg/dL — ABNORMAL HIGH (ref 70–99)
Glucose-Capillary: 214 mg/dL — ABNORMAL HIGH (ref 70–99)

## 2021-02-21 LAB — MAGNESIUM: Magnesium: 2.6 mg/dL — ABNORMAL HIGH (ref 1.7–2.4)

## 2021-02-21 MED ORDER — FUROSEMIDE 10 MG/ML IJ SOLN
40.0000 mg | Freq: Once | INTRAMUSCULAR | Status: AC
  Administered 2021-02-21: 40 mg via INTRAVENOUS
  Filled 2021-02-21: qty 4

## 2021-02-21 MED ORDER — DILTIAZEM HCL ER COATED BEADS 120 MG PO CP24
120.0000 mg | ORAL_CAPSULE | Freq: Every day | ORAL | Status: DC
  Administered 2021-02-21 – 2021-03-03 (×11): 120 mg via ORAL
  Filled 2021-02-21 (×11): qty 1

## 2021-02-21 MED ORDER — POTASSIUM CHLORIDE CRYS ER 20 MEQ PO TBCR
40.0000 meq | EXTENDED_RELEASE_TABLET | Freq: Two times a day (BID) | ORAL | Status: AC
  Administered 2021-02-21 (×2): 40 meq via ORAL
  Filled 2021-02-21 (×2): qty 2

## 2021-02-21 NOTE — Consult Note (Addendum)
Cardiology Consultation:   Patient ID: Anthony [name]. MRN: [id number]; DOB: 1950/02/05  Admit date: 02/12/2021 Date of Consult: 02/21/2021  PCP:  Anthony Nutting, DO   [facility] Providers Cardiologist:  Anthony Chandler, MD        Patient Profile:   Anthony [name]. is a 71 y.o. male with a hx of CAD (inferior STEMI 2012 s/p BMS to RCA with residual moderate nonobstructive disease in LAD), recurrent DVT (2008, 2014, tx with Xarelto), COPD, tobacco abuse with hx of chain smoking, HTN, HLD, bipolar disorder, depression, anxiety, prior polysubstance abuse (ETOH, heroin, cocaine, THC), pulmonary nodules, arthritis, PAD s/p L AKA complicated by PAF/flutter 09/2020 who is being seen 02/21/2021 for the evaluation of hypoxia and atrial fibrillation at the request of Dr. Tana [name].  History of Present Illness:   Mr. Umland follows with Dr. Angelena [name] with above cardiac hx. EF at time of MI reported to be 50%. No available echo in records. He was last seen by telemedicine 04/2018, lost to follow-up thereafter. He was admitted to [facility] 09/2020 with critical limb ischemia. CTA demonstrated occlusion of the majority of arteries to his left lower extremity. He underwent left ileofemoral thrombectomy and arteriogram with Dr. Kathreen Cosier in order to improve inflow for amputation. He then underwent left above knee amputation due to gangrenous changes. He was noted to have transient post-op atrial fib/flutter and was treated with Toprol/Xarelto. Post-hospital course also notable for intermittent confusion. He required discharge to SNF. He has a prior hx of cocaine, heroin, THC and ETOH use but states he has not been using any substances since being in SNF.  He presented to [facility] 02/12/2021 with worsening SOB + hypoxia. Per h/p he had fallen several times over the last few months as well although he denies this to me. He states he came into the hospital to look at girls. He appears to frequently  deflect interview questions with humor. He was Covid positive on admission. Troponins were negative. BNP 101. D-dimer has been elevated. Initial CXR notable for coarse interstitial markings, consistent with ILD/fibrosis. CT angio 2/5 showed no PE, + chronic ILD, multiple bilateral rib fractures, CAD, emphysema, and aortic atherosclerosis. 2/11 duplex showed extensive RLE DVT. He has been managed by both internal medicine and the pulmonology team. Despite treatment for his Covid as well as diuresis and steroids, he has continued to require significant O2 with HFNC. Last pulmonology note indicates this is felt likely due to [place] and likely pulmonary micro-emboli on top of R hemidiaphragm dysfunction/paralysis and underlying UIP. His anticoagulation was changed to Eliquis due to presumed Xarelto failure. 2D echo is pending with bubble study. F/u CXR yesterday with increased central vascular congestion and lower zonal interstitial edema, persistent L pleural effusion with increased L basilar opacity (atx vs pneumonic consolidation), aortic atherosclerosis. No recent weights on file; documented -14L thus far. During his admission he has been noted to have intermittent paroxysms of rapid atrial fib (in NSR on admission). Cardiology consulted to assist. He last converted to NSR around 3am this morning. He denies any chest pain, orthopnea, edema or palpitations.   Past Medical History:  Diagnosis Date   Anxiety    Arthritis    Atrial fibrillation (HCC)    Atrial flutter (HCC)    Bipolar disorder (HCC)    CAD (coronary artery disease)    a. INF STEMI 07/04/10:  tx with thrombectomy + Vision BMS to mRCA;  cath 07/04/10: dLM 10-20%, pLAD 40-50%, mLAD 20-30%, pRCA  30%, mRCA occluded and tx with PCI, EF 50% with inf HK. A Multilink   COPD (chronic obstructive pulmonary disease) (HCC)    Diabetes mellitus ([place])    History of DVT (deep vein thrombosis)    HLD (hyperlipidemia)    Hypertension    Left above-knee  amputee (HCC)    PAD (peripheral artery disease) (HCC)    Polysubstance abuse ([place])    Prior hx   Tobacco abuse    Urinary frequency    Vitamin D deficiency 09/05/2018    Past Surgical History:  Procedure Laterality Date   CARDIAC CATHETERIZATION     COLONOSCOPY N/A 12/29/2015   Procedure: COLONOSCOPY;  Surgeon: Rogene Houston, MD;  [facility]: [facility];  Service: Endoscopy;  Laterality: N/A;  12:55   ELECTROCARDIOGRAM     Showed inferolateral ST-elevation and a code STEMI was activated. In the ER, he was treated with morphine, herarin, and 600 mg of Plavix. He was transferred emergently to [facility].    INGUINAL HERNIA REPAIR Left 07/26/2014   Procedure: OPEN REPAIR OF RECURENT LEFT INGUINAL HERNIA REPAIR WITH MESH;  Surgeon: Greer Pickerel, MD;  Location: [facility] ORS;  Service: General;  Laterality: Left;   INSERTION OF MESH Bilateral 10/30/2013   Procedure: INSERTION OF MESH;  Surgeon: Gayland Curry, MD;  Location: [facility];  Service: General;  Laterality: Bilateral;   LAPAROSCOPIC INGUINAL HERNIA WITH UMBILICAL HERNIA Bilateral 10/30/2013   Procedure: laparoscopic repair left pantaloom hernia with mesh, laparoscopic right inguinal hernia with mesh, OPEN REPAIR OF UMBILICAL HERNIA ;  Surgeon: Gayland Curry, MD;  Location: [facility];  Service: General;  Laterality: Bilateral;   LAPAROSCOPY N/A 07/26/2014   Procedure: LAPAROSCOPY DIAGNOSTIC;  Surgeon: Greer Pickerel, MD;  Location: [facility];  Service: General;  Laterality: N/A;   POLYPECTOMY  12/29/2015   Procedure: POLYPECTOMY;  Surgeon: Rogene Houston, MD;  [facility]: [facility];  Service: Endoscopy;;  ascending colon, descending colon   stent placement     TOTAL HIP ARTHROPLASTY Right 05/10/2015   Procedure: RIGHT TOTAL HIP ARTHROPLASTY ANTERIOR APPROACH;  Surgeon: Paralee Cancel, MD;  Location: [facility];  Service: Orthopedics;  Laterality: Right;   VASECTOMY       Home Medications:  Prior to Admission medications   Medication Sig Start  Date End Date Taking? Authorizing Provider  albuterol (VENTOLIN HFA) 108 (90 Base) MCG/ACT inhaler Inhale 2 puffs into the lungs in the morning, at noon, and at bedtime.   Yes [provider]  Ascorbic Acid (VITAMIN C) 1000 MG tablet Take 1,000 mg by mouth daily.   Yes [provider]  aspirin EC 81 MG tablet Take 1 tablet (81 mg total) by mouth daily. 02/25/17  Yes Burnell Blanks, MD  atorvastatin (LIPITOR) 40 MG tablet Take 1 tablet (40 mg total) by mouth daily. 12/22/18  Yes Emeterio Reeve, DO  Baclofen 5 MG TABS Take 5 mg by mouth in the morning and at bedtime.   Yes [provider]  Dextromethorphan-guaiFENesin (ROBITUSSIN COUGH+CHEST CONG DM) 10-200 MG CAPS Take 1 capsule by mouth in the morning, at noon, and at bedtime.   Yes [provider]  gabapentin (NEURONTIN) 100 MG capsule Take 100 mg by mouth 2 (two) times daily.   Yes [provider]  metFORMIN (GLUCOPHAGE-XR) 500 MG 24 hr tablet TAKE 2 TABLETS BY MOUTH  DAILY WITH BREAKFAST Patient taking differently: Take 750 mg by mouth in the morning and at bedtime. 01/18/20  Yes  Anthony Nutting, DO  metoprolol succinate (TOPROL-XL) 25 MG 24 hr tablet TAKE ONE-HALF TABLET BY  MOUTH DAILY Patient taking differently: Take 12.5 mg by mouth daily. 12/12/20  Yes Anthony Nutting, DO  mirtazapine (REMERON) 30 MG tablet TAKE 1 TABLET BY MOUTH AT  BEDTIME Patient taking differently: Take 90 mg by mouth at bedtime. 05/26/20  Yes Anthony Nutting, DO  nystatin ointment (MYCOSTATIN) Apply 1 application topically 2 (two) times daily.   Yes [provider]  rivaroxaban (XARELTO) 20 MG TABS tablet Take 20 mg by mouth daily with supper.   Yes [provider]  Semaglutide 7 MG TABS Take 7 mg by mouth daily.   Yes [provider]  temazepam (RESTORIL) 15 MG capsule TAKE 1 CAPSULE BY MOUTH AT BEDTIME AS NEEDED FOR SLEEP Patient taking differently: Take 15 mg by mouth at bedtime. 02/05/20   Yes Anthony Nutting, DO  traZODone (DESYREL) 50 MG tablet TAKE 1 TO 2 TABLETS BY MOUTH AT BEDTIME AS NEEDED FOR SLEEP Patient not taking: Reported on 02/12/2021 06/09/20   Anthony Nutting, DO  baclofen (LIORESAL) 10 MG tablet TAKE 1 TABLET BY MOUTH  TWICE DAILY Patient not taking: Reported on 02/12/2021 03/25/20   Anthony Nutting, DO  blood glucose meter kit and supplies KIT Dispense based on patient and insurance preference. Use to check BG once daily or every other other.  Dx: E11.9 07/17/16   [name] A, FNP  gabapentin (NEURONTIN) 800 MG tablet Take 1 tablet (800 mg total) by mouth 3 (three) times daily. Patient not taking: Reported on 02/12/2021 11/14/18   Silverio Decamp, MD  glipiZIDE (GLUCOTROL) 10 MG tablet Take 1 tablet (10 mg total) by mouth 2 (two) times daily before a meal. Patient not taking: Reported on 02/12/2021 12/22/18   Emeterio Reeve, DO  lisinopril (ZESTRIL) 5 MG tablet Take 1 tablet (5 mg total) by mouth daily. Patient not taking: Reported on 02/12/2021 12/22/18   Emeterio Reeve, DO  sertraline (ZOLOFT) 50 MG tablet Take 1/2 tab (69m) by mouth at bedtime for 6 days then increase to 1 tab (546m by mouth every night at bedtime. Patient not taking: Reported on 02/12/2021 01/22/20   Early, SaCoralee PesaNP  traMADol (ULTRAM) 50 MG tablet TAKE 1 TABLET BY MOUTH EVERY 6 HOURS AS NEEDED FOR SEVERE PAIN Patient not taking: Reported on 02/12/2021 02/24/20   MaLuetta NuttingDO    Inpatient Medications: Scheduled Meds:  apixaban  10 mg Oral BID   Followed by   [SDerrill MemoN 02/25/2021] apixaban  5 mg Oral BID   vitamin C  500 mg Oral Daily   aspirin EC  81 mg Oral Daily   atorvastatin  40 mg Oral Daily   benzonatate  200 mg Oral TID   budesonide (PULMICORT) nebulizer solution  0.5 mg Nebulization BID   Chlorhexidine Gluconate Cloth  6 each Topical Q0600   escitalopram  10 mg Oral QHS   feeding supplement (GLUCERNA SHAKE)  237 mL Oral BID BM   gabapentin  100 mg Oral BID   guaiFENesin   1,200 mg Oral BID   insulin aspart  0-20 Units Subcutaneous TID WC   insulin aspart  0-5 Units Subcutaneous QHS   insulin aspart  10 Units Subcutaneous TID WC   insulin glargine-yfgn  26 Units Subcutaneous QHS   ipratropium-albuterol  3 mL Nebulization TID   mouth rinse  15 mL Mouth Rinse BID   metoprolol tartrate  25 mg Oral BID   mirtazapine  30 mg Oral QHS   multivitamin with minerals  1 tablet Oral Daily   potassium chloride  40 mEq Oral BID   predniSONE  40 mg Oral Q breakfast   Ensure Max Protein  11 oz Oral Daily   zinc sulfate  220 mg Oral Daily   Continuous Infusions:  sodium chloride Stopped (02/17/21 0854)   PRN Meds: sodium chloride, acetaminophen **OR** acetaminophen, chlorpheniramine-HYDROcodone, hydrOXYzine  Allergies:    Allergies  Allergen Reactions   Flomax [Tamsulosin Hcl] Other (See Comments)    This medication makes patient feel sick   Penicillins Other (See Comments)    Reaction unknown occurred during childhood Has patient had a PCN reaction causing immediate rash, facial/tongue/throat swelling, SOB or lightheadedness with hypotension: unsure - childhood reaction Has patient had a PCN reaction causing severe rash involving mucus membranes or skin necrosis: unsure - childhood reaction Has patient had a PCN reaction that required hospitalization no Has patient had a PCN reaction occurring within the last 10 years: no If all of the above answers are "NO", then may p    Social History:   Social History   Socioeconomic History   Marital status: Divorced    Spouse name: Not on file   Number of children: Not on file   Years of education: Not on file   Highest education level: Not on file  Occupational History   Not on file  Tobacco Use   Smoking status: Light Smoker    Packs/day: 1.50    Years: 45.00    Pack years: 67.50    Types: Cigarettes   Smokeless tobacco: Never  Vaping Use   Vaping Use: Never used  Substance and Sexual Activity   Alcohol  use: No    Comment: past hx of ETOH use was in inpt facility per court order  - 20 years, 10-18-2015 per pt not anymore,per pt stopped about 15-20 yrs ago   Drug use: No    Types: Marijuana, Heroin, Cocaine    Comment: snorted heroin and cocaine, 10-18-2015 per pt in the past he did Marijuana only and nothing else   Sexual activity: Not Currently    Partners: Female  Other Topics Concern   Not on file  Social History Narrative   Lives in [place]. He lives alone. He has kids but is not married.    Social Determinants of Health   Financial Resource Strain: Not on file  Food Insecurity: Not on file  Transportation Needs: Not on file  Physical Activity: Not on file  Stress: Not on file  Social Connections: Not on file  Intimate Partner Violence: Not on file    Family History:   = Family History  Problem Relation Age of Onset   Coronary artery disease Father    Depression Father    Heart attack Father    Depression Mother      ROS:  Please see the history of present illness.   All other ROS reviewed and negative.     Physical Exam/Data:   Vitals:   02/21/21 0700 02/21/21 0800 02/21/21 0843 02/21/21 0903  BP:  105/80  135/89  Pulse: 69 61  73  Resp: 19 17    Temp:  97.6 F (36.4 C)    TempSrc:  Oral    SpO2: 92% (!) 85% (!) 89%   Weight:      Height:        Intake/Output Summary (Last 24 hours) at 02/21/2021 1155 Last data filed at 02/20/2021  2000 Gross per 24 hour  Intake 303.75 ml  Output 2425 ml  Net -2121.25 ml   Last 3 Weights 02/12/2021 01/15/2020 11/27/2019  Weight (lbs) 192 lb 3.9 oz 208 lb 11.2 oz 199 lb 9.6 oz  Weight (kg) 87.2 kg 94.666 kg 90.538 kg  Some encounter information is confidential and restricted. Go to Review Flowsheets activity to see all data.     Body mass index is 28.39 kg/m.  General: Chronically ill appearing WM in no acute distress. Head: Normocephalic, atraumatic, sclera non-icteric, no xanthomas, nares are without  discharge. Neck: Negative for carotid bruits. JVP not elevated. Lungs: Diffuse wheezing anteriorly. Coarse BS bilaterally posteriorly. Moderate air movement. No rhonchi or overt rales. Breathing is unlabored. Heart: RRR S1 S2 without murmurs, rubs, or gallops.  Abdomen: Soft, non-tender, non-distended with normoactive bowel sounds. No rebound/guarding. Extremities: No clubbing or cyanosis. No edema in RLE or L AKA stump. S/p L AKA. Neuro: Alert and oriented X 3. Moves all extremities spontaneously. Psych:  Responds to questions appropriately with a normal affect.   EKG:  The EKG was personally reviewed and demonstrates:   Multiple tracings reviewed (initially NSR on arrival), last tracing yesterday - atrial fib 138bpm, left axis deviation, prior inferior infarct, nonspecific STTW changes   Telemetry:  Telemetry was personally reviewed and demonstrates:  NSR presently but previous paroxysms of atrial fibrillation  Relevant CV Studies: Cath 06/2010 DATE OF PROCEDURE:  07/04/2010 DATE OF DISCHARGE:                            CARDIAC CATHETERIZATION     INDICATIONS:  Mr. Gabreil [place]on[place] is a 71 year old gentleman who has a history of bipolar disorder and ongoing 2-pack-per-day tobacco use.  The patient has been having stuttering chest pain for the past 2 days. Apparently today, he presented to [facility] Emergency Room.  An ECG showed ST-segment elevation inferior wall myocardial infarction.  A code STEMI was called and the patient was transported to [facility].   Upon arrival to [facility], the patient still had recurrent chest tightness.  He was bradycardic with pulses in the low 40s, which in one occasion had dropped into the 30s.  His right femoral artery was punctured anteriorly, and a 6-French arterial sheath and 7-French venous sheath were inserted without difficulty.  Due to the patient's significant bradycardia, a transvenous temporary pacemaker was inserted and  advanced to the RV apex with excellent capture and thresholds.  A 6- [place] diagnostic left catheter was then inserted and selective angiography was performed.  A right guiding catheter, 6-French was then inserted and angiography confirmed total mid RCA occlusion with TIMI 0 flow.  Apparently, the patient was treated with 600 mg of Plavix prior to transfer from [facility].  He was started on bivalirudin in the laboratory.  Asahi medium wire was advanced down the RCA and was able to cross the occlusion partially.  A 2.5 x 15 mm Emerge Monorail balloon was then inserted.  This provide additional support to totally cross the lesion and several dilatations were performed at 2,4, 8 and 10 atmospheres.  After initial opening, there was significant clot burden. An Expressway thrombectomy catheter was then inserted and 3 runs were made with significant clot retrieval.  A 3.0 x 15-mm Trek balloon was then used for additional dilatation prior to stenting.  Due to concerns of potential medical compliance with Plavix long-term with a large- caliber RCA, decision  was made to use a bare-metal stent.  A Multilink Vision 3.5 x 18-mm stent was then inserted and successfully deployed x2 up to 13 atmospheres.  A 4.0 x 12-mm noncompliant Trek was used for post- stent dilatation with dilatation up to 3.91 mm.  The patient did have transient hypotension at the start of the study requiring initiation of dopamine up to 4 mcg.  Scout angiography confirmed an excellent angiographic result.  A 6-French pigtail catheter was then inserted and left ventriculography was performed.  Distal aortography was done.   HEMODYNAMIC DATA:  Central aortic pressure was initially 75/54 at the completion of the procedure, central aortic pressure was approximately 100/60 and left ventricular pressure was 103/6.  Post A-wave 16.   ANGIOGRAPHIC DATA:  The left main coronary artery was a large vessel, which bifurcated into an  LAD and left circumflex system.  There was mild 10-20% distal taper of the left main.   The LAD was a moderate-sized vessel that gave rise to a small high diagonal ramus intermediate-like vessel.  The proximal LAD had irregularity with narrowing of 40% to less than 50% in the region of the first septal perforating artery takeoff.  In the midportion of the LAD, there was luminal irregularity with narrowings of 20-30%.  LAD extended to the apex.  Collateralization of PLA branch of the RCA was visualized from the left injection.   The circumflex vessel was moderate-sized vessel that gave rise to one large diagonal vessel and also supplied left atrial circumflex branch.   The right coronary artery was a large-caliber vessel that had 30% proximal narrowing.  The RCA was totally occluded in its midsegment after the takeoff of small anterior RV marginal branch.  There was initial TIMI 0 flow.   As noted above, temporary pacemaker was in place for the intervention and the patient was started on dopamine for hypotension and fluids were increased.  Following initial dilatation at the initial occluded site, there was significant thrombus burden beyond this segment.  Expressway thrombectomy was performed with significant clot removal.  Following additional dilatation and subsequent stenting with a 3.5 x 18-mm bare- metal Vision stent, postdilated to 3.91 mm, the 100% occlusion was reduced to 0%.  There was now brisk TIMI 3 flow and the vessel supplied a large PDA and posterolateral vessel.   RAO ventriculography revealed mild LV dysfunction acutely with an ejection fraction of approximately 50% with mild inferior hypocontractility.   Distal aortography revealed widely patent renal arteries without significant aortoiliac disease.   IMPRESSION: 1. Acute ST-segment elevation inferior wall myocardial infarction     secondary to total occlusion of the mid right coronary artery. 2. Transient  bradycardia to heart rate down to 38 requiring temporary     pacemaker insertion. 3. Hypotension requiring inotropic support with dopamine. 4. Mild 10-20% distal tapering of the left main, with 40-50% proximal     left anterior descending stenoses and luminal irregularities of 20-     30% in the mid segment of the left anterior descending with initial     left-to-right collateralization to the posterolateral artery branch     of the RCA. 5. Normal left circumflex coronary artery. 6. Total occlusion of the mid right coronary artery with initial TIMI     0 flow. 7. Successful percutaneous coronary intervention requiring initial     thrombectomy, percutaneous transluminal coronary angioplasty and     ultimate stenting with a 3.5 x 18-mm bare-metal Vision stent,     postdilated  3.91 mm. 8. Because of haziness and clot during the procedure, the patient was     also started on Integrilin in addition to bivalirudin and had     received 600 mg oral Plavix prior to transfer to [place]:  39 minutes.             ______________________________ Shelva Majestic, M.D.    Laboratory Data:  High Sensitivity Troponin:   Recent Labs  Lab 02/12/21 1254 02/12/21 1454  TROPONINIHS 12 10     Chemistry Recent Labs  Lab 02/17/21 0300 02/20/21 0305 02/21/21 0321  NA 134* 136 137  K 4.8 3.4* 3.7  CL 101 96* 99  CO2 26 33* 32  GLUCOSE 379* 161* 138*  BUN 37* 34* 43*  CREATININE 0.84 0.64 0.74  CALCIUM 8.4* 8.5* 8.8*  MG 2.2 2.2 2.6*  GFRNONAA >60 >60 >60  ANIONGAP _0 Recent Labs  Lab 02/15/21 0254 02/16/21 0256 02/17/21 0300  PROT 6.6 6.2* 6.3*  ALBUMIN 3.1* 3.0* 3.1*  AST 12* 14* 16  ALT _1 ALKPHOS 47 46 56  BILITOT 0.3 0.3 0.4   Lipids No results for input(s): CHOL, TRIG, HDL, LABVLDL, LDLCALC, CHOLHDL in the last 168 hours.  Hematology Recent Labs  Lab 02/17/21 0300 02/19/21 0850 02/20/21 0305  WBC 16.6*  19.8* 15.0*  RBC 5.55 5.98* 5.80  HGB 16.6 17.8* 17.3*  HCT 49.7 53.2* 51.4  MCV 89.5 89.0 88.6  MCH 29.9 29.8 29.8  MCHC 33.4 33.5 33.7  RDW 12.2 12.4 12.5  PLT 328 298 290   Thyroid No results for input(s): TSH, FREET4 in the last 168 hours.  BNP Recent Labs  Lab 02/16/21 0256  BNP 101.5*    DDimer  Recent Labs  Lab 02/15/21 0254 02/16/21 0256 02/17/21 0300  DDIMER 2.01* 1.85* 1.95*     Radiology/Studies:  DG Chest 1 View  Result Date: 02/20/2021 CLINICAL DATA:  Hypoxia with history of COVID. EXAM: CHEST  1 VIEW COMPARISON:  Portable chest yesterday at 8:20 a.m. FINDINGS: 4:50 a.m., 02/20/2021. The heart is enlarged. There is increased central vascular prominence. There is increased interstitial consolidation in the hypoinflated lower lung fields consistent with edema. Findings may suspect CHF or fluid overload. The lungs are expiratory with small left pleural effusion noted and increased left basilar opacity, compatible with atelectasis or consolidation. No significant right pleural collection is seen. The upper lung fields remain clear.  No other changes are noted. There is aortic atherosclerosis and tortuosity.  Stable mediastinum. IMPRESSION: 1. Increased central vascular congestion and lower zonal interstitial edema. 2. Persistent left pleural effusion with increased left basilar opacity, atelectasis versus pneumonic consolidation. 3. Aortic atherosclerosis. Electronically Signed   By: Telford Nab M.D.   On: 02/20/2021 06:23   DG CHEST PORT 1 VIEW  Result Date: 02/19/2021 CLINICAL DATA:  71 year old male with history of hypoxia. EXAM: PORTABLE CHEST 1 VIEW COMPARISON:  Chest x-ray 02/18/2021. FINDINGS: Lung volumes remain low. Opacities in the lung bases (left-greater-than-right) may reflect areas of atelectasis and/or consolidation. Small left pleural effusion. No pneumothorax. No suspicious appearing pulmonary nodules or masses are noted. No evidence of pulmonary edema.  Heart size is normal. Upper mediastinal contours are within normal limits. Atherosclerotic calcifications are noted in the thoracic aorta. IMPRESSION: 1. Low lung volumes with bibasilar (left-greater-than-right) areas of atelectasis and/or consolidation. 2. Small left pleural effusion. 3. Aortic atherosclerosis. Electronically Signed  By: [name] M.D.   On: 02/19/2021 08:40   DG CHEST PORT 1 VIEW  Result Date: 02/18/2021 CLINICAL DATA:  71 year old male with history of acute respiratory failure with hypoxia. EXAM: PORTABLE CHEST 1 VIEW COMPARISON:  Chest x-ray 02/16/2021. FINDINGS: Lung volumes are low. Elevation of the right hemidiaphragm, similar to the prior study. Diffuse peribronchial cuffing. Bibasilar opacities which may reflect areas of atelectasis and/or consolidation. Blunting of the left costophrenic sulcus, suggesting a small left pleural effusion. No pneumothorax. No evidence of pulmonary edema. Heart size is normal. Upper mediastinal contours are within normal limits. IMPRESSION: 1. Diffuse peribronchial cuffing concerning for an acute bronchitis. 2. Bibasilar opacities which may reflect areas of atelectasis and/or consolidation. 3. Small left pleural effusion. 4. Elevation of the right hemidiaphragm again noted. 5. Aortic atherosclerosis. Electronically Signed   By: [name] M.D.   On: 02/18/2021 08:09   VAS [place] LOWER EXTREMITY VENOUS (DVT)  Result Date: 02/19/2021  Lower Venous DVT Study Patient Name:  Zadiel Leyh.  Date of Exam:   02/18/2021 Medical Rec #: [id number]           Accession #:    [id number] Date of Birth: 11-08-1950           Patient Gender: M Patient Age:   40 years Exam Location:  [facility] Procedure:      VAS [place] LOWER EXTREMITY VENOUS (DVT) Referring Phys: Ina Homes --------------------------------------------------------------------------------  Indications: History of right lower extremity DVT, Covid-19, edema.  Risk Factors: History of  arterial disease with left above knee amputation. Anticoagulation: Xarelto. Limitations: Acoustic shadowing due to overlying arterial calcification. Comparison Study: 03-28-2017 Most recent prior right lower extremity venous was                   negative for DVT. 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 Performing Technologist: Darlin Coco RDMS, RVT  Examination Guidelines: A complete evaluation includes B-mode imaging, spectral Doppler, color Doppler, and power Doppler as needed of all accessible portions of each vessel. Bilateral testing is considered an integral part of a complete examination. Limited examinations for reoccurring indications may be performed as noted. The reflux portion of the exam is performed with the patient in reverse Trendelenburg.  +---------+---------------+---------+-----------+----------+-----------------+  RIGHT     Compressibility Phasicity Spontaneity Properties Thrombus Aging     +---------+---------------+---------+-----------+----------+-----------------+  CFV       Full            Yes       Yes                                       +---------+---------------+---------+-----------+----------+-----------------+  SFJ       Full                                                                +---------+---------------+---------+-----------+----------+-----------------+  FV Prox   Partial         Yes       Yes  Chronic            +---------+---------------+---------+-----------+----------+-----------------+  FV Mid    Partial         Yes       Yes                    Age Indeterminate  +---------+---------------+---------+-----------+----------+-----------------+  FV Distal None            No        No                     Age Indeterminate  +---------+---------------+---------+-----------+----------+-----------------+  PFV       Full            Yes       Yes                                        +---------+---------------+---------+-----------+----------+-----------------+  POP       Partial         Yes       Yes                    Age Indeterminate  +---------+---------------+---------+-----------+----------+-----------------+  PTV       Partial                                          Age Indeterminate  +---------+---------------+---------+-----------+----------+-----------------+  PERO      Full                                                                +---------+---------------+---------+-----------+----------+-----------------+  Gastroc   Full                                                                +---------+---------------+---------+-----------+----------+-----------------+ 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  +---------+---------------+---------+-----------+----------+--------------+  LEFT      Compressibility Phasicity Spontaneity Properties Thrombus Aging  +---------+---------------+---------+-----------+----------+--------------+  CFV       Full            Yes       Yes                                    +---------+---------------+---------+-----------+----------+--------------+  SFJ       Full                                                             +---------+---------------+---------+-----------+----------+--------------+  FV Prox   Full                                                             +---------+---------------+---------+-----------+----------+--------------+  FV Mid    Full                                                             +---------+---------------+---------+-----------+----------+--------------+  FV Distal Full                                                             +---------+---------------+---------+-----------+----------+--------------+  PFV       Full                                                              +---------+---------------+---------+-----------+----------+--------------+ 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    Summary: RIGHT: - Findings consistent with age indeterminate deep vein thrombosis involving the right femoral vein, right popliteal vein, and right posterior tibial veins. - No cystic structure found in the popliteal fossa. 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  LEFT: - No evidence of common femoral vein obstruction. Patent femoral vein and profunda femoral vein. 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  *See table(s) above for measurements and observations. Electronically signed by Jamelle Haring on 02/19/2021 at 10:40:46 AM.    Final      Assessment and Plan:   1. Acute hypoxemic respiratory failure - suspected multifactorial driven by Covid infection superimposed on underlying COPD, ILD (UIP per pulm), pulmonary nodules and potentially pulmonary microemboli on top of R hemidiaphragm dysfunction/paralysis - did not have significant clinical response to diuresis per notes, BNP only 101 earlier this admission so less likely to be predominantly heart failure driven, although would not be surprised if he has some degree of LV dysfunction given multiple stressors - pulm continues to follow  2. 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 Paroxysmal atrial fib/flutter - initially reported 09/2020 during admission for PAD/AKA, re-emerged periodically this admission not surprising in context of severe underlying lung disease - currently back in NSR - beta blocker held earlier this admission, resumed yesterday evening -> will discuss with MD - given normal LV function and severe pulmonary disease, consider switching to  diltiazem if EF is normal when echo results - amiodarone seems less ideal for maintenance of NSR given severe pulm issues - anticoagulated as above - doubtful to be contributing but check TSH in AM  4. Remote hx CAD - no recent angina, troponins negative  5. PAD s/p L AKA - continue OP follow-up - maintaining on ASA in addition to his Eliquis, also on atorvastatin 34m daily - update lipids in AM  6. Essential HTN - managed in context above  Risk Assessment/Risk Scores:       CHA2DS2-VASc Score = 6   This indicates a 9.7% annual risk of stroke. The patient's score  is based upon: CHF History: 0 HTN History: 1 Diabetes History: 1 Stroke History: 2 (recurrent VTE) Vascular Disease History: 1 Age Score: 1 Gender Score: 0     For questions or updates, please contact [facility] Please consult [email or web address] for contact info under    Signed, Charlie Pitter, PA-C  02/21/2021 11:55 AM  History and all data above reviewed.  Patient examined.  I agree with the findings as above.   He denies any symptoms.  No pain.  No SOB.  Does not report palpitations.  Thinks his breathing is imrpoved.   ECHO:    1. Non-diagnostic bubble study despite 3 attempts. Would recommend TCDs  or TEE if clinically indicated.   2. Left ventricular ejection fraction, by estimation, is 60 to 65%. The  left ventricle has normal function. The left ventricle has no regional  wall motion abnormalities. Left ventricular diastolic parameters were  normal.   3. Right ventricular systolic function is normal. The right ventricular  size is normal. Tricuspid regurgitation signal is inadequate for assessing  PA pressure.   4. The mitral valve is grossly normal. No evidence of mitral valve  regurgitation. No evidence of mitral stenosis.   5. The aortic valve is tricuspid. There is mild calcification of the  aortic valve. Aortic valve regurgitation is not visualized. No aortic  stenosis is present.   6. The  inferior vena cava is normal in size with greater than 50%  respiratory variability, suggesting right atrial pressure of 3 mmHg.     The patient exam reveals COR:RRR  ,  Lungs: Decreased   ,  Abd: Positive bowel sounds, no rebound no guarding, Ext No edema  .  All available labs, radiology testing, previous records reviewed. Agree with documented assessment and plan.   Acute respiratory failure:  He does not seem to have pulmonary edema and I don't strongly believe that acute on chronic diastolic dysfunction is the issue with his respiratory failure.  The EF is preserved as above.  No significant valve abnormalities.  I will stop beta blocker on the outside chance this is contributing to any reactive airway component.  I will change to Cardizem.   Kyllian Clingerman  1:19 PM  02/21/2021

## 2021-02-21 NOTE — Progress Notes (Signed)
NAME:  Anthony Ternes., MRN:  [id number], DOB:  10/24/1950, LOS: 71 ADMISSION DATE:  02/12/2021, CONSULTATION DATE:  2/10 REFERRING MD:  Dr. Tana Coast, CHIEF COMPLAINT:  SOB   History of Present Illness:  71 y/o M who presented to [facility] ER on 2/5 with reports of SOB and low oxygen saturations.   The patient resides at [facility].  He tested positive for COVID on 1/27 and began having respiratory difficulties on 1/29.  EMS was called for evaluation on 2/5 and found the patient to have saturations of 78% on arrival.  He reportedly had been experiencing multiple falls at the facility after left AKA.  Per notes, his baseline at the facility is independent ambulation, self performs ADL's without assistance, no hx of memory issues.   On ER arrival, the patient was requiring O2 but sats had improved.  He was evaluated with a CT of the chest to rule out PE which was negative for PE but showed bilateral paraseptal emphysema, bilateral peripheral chronic interstitial lung disease, pulmonary nodules, multiple bilateral rib fractures.  The patient required 15L salter O2 and intermittent use of NRB when sleeping.  PCT negative.  H was treated with remdesivir, oxygen, & solumedrol. The patients O2 needs remained persistently elevated.  He was diuresed with lasix.  The patient elected for DNI status on admission.    PCCM consulted 2/10 for evaluation of hypoxic respiratory failure.   Pulmonary hx: began smoking age 66, smoked up to 2ppd at his heaviest, quit smoking 1 year ago (2022) when he went into the SNF after his amputation.  Reports he is supposed to be on oxygen at night but the facility does provide it for him.  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   Pertinent  Medical History  Bipolar Depression / Anxiety  HTN HLD CAD s/p MI 2013 Nocturnal Hypoxemia - seen on sleep study 2020, no apnea  Tobacco Abuse  COPD - reported, no formal PFT's on file  DM - Hgb A1c  7.7 02/12/21  Hx DVT - traumatic, s/p coumadin therapy  Vitamin D deficiency  Urinary Frequency  Left AKA - in 2022  Significant Hospital Events: Including procedures, antibiotic start and stop dates in addition to other pertinent events   2/5 Admit  2/10 PCCM consulted for hypoxic respiratory failure  2/11 LE duplex with extensive R DVT  Interim History / Subjective:  No events. Diuresed well with no effect on oxygenation.  Objective   Blood pressure 99/69, pulse 69, temperature 97.9 F (36.6 C), temperature source Axillary, resp. rate 19, height 5\' 9"  (1.753 m), weight 87.2 kg, SpO2 92 %.        Intake/Output Summary (Last 24 hours) at 02/21/2021 0806 Last data filed at 02/20/2021 2000 Gross per 24 hour  Intake 543.75 ml  Output 2925 ml  Net -2381.25 ml    Filed Weights   02/12/21 1823  Weight: 87.2 kg    Examination: No distress Lungs with wheezing, diminished breath sounds on R stable Ext with minimal edema Moves all 4 ext to command [facility] Problem list      Assessment & Plan:   Acute hypoxemic respiratory failure- COVID and likely pulmonary micro-emboli on top of R hemidiaphragm dysfunction/paralysis and underlying UIP.  He denies breathlessness prior to current illnesss.  No real change over last several days.  Trialing diuresis. Pulmonary nodules POA Hx of prior DVT Hx tobacco abuse DM2  with hyperglycemia Hx CAD Hx PVD s/p L AKA Situational depression/anxiety-   - Push spirometry - Another dose of lasix - Continue PO prednisone to complete 10 days - DC BIPAP, keeps refusing - Continue eliquis with presumed xarelto failure - Will need OP pulmonary f/u for nodules and UIP +/- emphysema overlap. - Start lexapro and PRN atarax - Agree with echo w/ bubble - Will see tomorrow for final recs: may just need LTACH and time  Best Practice (right click and "Reselect all SmartList Selections" daily)  Per Primary    Erskine Emery MD PCCM

## 2021-02-21 NOTE — Progress Notes (Signed)
[facility]                                                                               Anthony [name], is a 71 y.o. male, DOB - 1950-04-09, DGL:[id number] Admit date - 02/12/2021    Outpatient Primary MD for the patient is [name], DO  LOS - 9  days    Brief summary   Anthony [name] is a 71 yo male with PMH CAD, PAD, HLD, COPD, bipolar, DM II who presented with worsening dyspnea at home.  He resides at a skilled nursing facility and was recently diagnosed with [place] outpatient around approximately 02/03/2021.  Due to becoming hypoxic, he was sent to the ER for further evaluation. He also underwent left AKA on 09/21/2020 and has had recurrent falls since. He was positive for COVID-19 on evaluation in the ER as well as significantly hypoxic requiring nonrebreather.  He was started on remdesivir, steroids, and breathing treatments. He was also evaluated for trauma from his falls and found to have multiple rib fractures bilaterally.  Difficult to wean O2, pulmonology following.   Assessment & Plan    Assessment and Plan: * Acute respiratory failure with hypoxia ([place])- (present on admission) - Chronic underlying lung disease, states he is on O2 at home. CTA chest shows bilateral paraseptal emphysema with chronic ILD.  Also has multiple pulmonary nodules.  No PE. -Etiology for worsening hypoxia felt due to exacerbation of underlying COPD as well as superimposed COVID infection - Difficult to wean O2, still on 15 L HFNC.  Started on albumin/Lasix, negative balance of 13.8 L.  Follow 2D echo. -On eliquis anticoagulation  Paroxysmal atrial fibrillation with RVR ([place])- (present on admission) - Patient noted to be in A-fib with RVR yesterday evening with heart rate in 140s, started on beta-blocker 25 mg p.o. twice daily -Currently normal sinus rhythm, on Eliquis -Follow 2D echo, cardiology consulted  HTN (hypertension) - BP stable  History of DVT (deep vein  thrombosis) Patient was on Xarelto prior to admission. -Venous Doppler showed a new DVT in the right lower extremity from proximal femoral to the popliteal and in single posterior tibial vein.   Also has bilateral arterial disease with occlusion of the proximal-distal LT SFA and a focal dilitation of the proximal RT SFA to 2.5 cm -Patient has known history of PAD.  Placed on therapeutic eliquis.    Rib fractures - Likely due to recurrent falls after his left AKA - CTA chest shows left 3rd - 5th and left 5th and 9th rib fractures -Continue incentive spirometry   Bipolar 1 disorder ([place]) - continue mirtazapine,  -Flat affect, pulmonology starting Lexapro and as needed Atarax  Hypokalemia - Replete PRN  COVID-19 virus infection - Reportedly diagnosed approximately 1 week prior to admission but unclear if he has been treated - Given worsening oxygen demands and several comorbidities, completed remdesivir on 2/9  -On p.o. prednisone, Pulmicort, Lasix  Multiple lung nodules- (present on admission) - Appears to be known from 2020 - CTA chest shows 6 mm left lower lobe, 3 and 5 mm right middle lobe - appears stable/smaller from prior imagining, continue outpatient monitoring -Will need outpatient  pulmonology follow-up   DM2 (diabetes mellitus, type 2) ([place]) - A1c 7.7% on admission - CBGs elevated due to steroids, improving since transition to oral prednisone. -Continue Semglee to 26 units at bedtime, continue NovoLog 10 units 3 times daily AC, resistant SSI  COPD (chronic obstructive pulmonary disease) ([place])- (present on admission) -Mild exacerbation in the setting of ongoing COVID infection  --Chest x-ray showed bibasilar left greater than right areas of atelectasis or consolidation.  Procalcitonin <0.1 -Pulmonology following, BiPAP nightly, started on albumin/Lasix  Hyperlipidemia associated with type 2 diabetes mellitus ([place])- (present on admission) - Continue Lipitor  CAD  (coronary artery disease)- (present on admission) -Currently on aspirin, beta-blocker, statin, eliquis     RN Pressure Injury Documentation: Pressure Injury 02/12/21 Buttocks Medial Stage 2 -  Partial thickness loss of dermis presenting as a shallow open injury with a red, pink wound bed without slough. (Active)  02/12/21 1830  Location: Buttocks  Location Orientation: Medial  Staging: Stage 2 -  Partial thickness loss of dermis presenting as a shallow open injury with a red, pink wound bed without slough.  Wound Description (Comments):   Present on Admission: Yes    Code Status: Prior to DVT Prophylaxis:   apixaban (ELIQUIS) tablet 10 mg  apixaban (ELIQUIS) tablet 5 mg   Level of Care: Level of care: Stepdown Family Communication: No family member at the bedside Disposition Plan:     Remains inpatient appropriate: Still on 15 L O2 via HFNC, difficult to wean  Procedures:  2D echo  Consultants:   Cardiology Pulmonology  Antimicrobials:  IV remdesivir 02/12/2021---> 02/16/2021 completed  Medications   apixaban  10 mg Oral BID   Followed by   Derrill Memo ON 02/25/2021] apixaban  5 mg Oral BID   vitamin C  500 mg Oral Daily   aspirin EC  81 mg Oral Daily   atorvastatin  40 mg Oral Daily   benzonatate  200 mg Oral TID   budesonide (PULMICORT) nebulizer solution  0.5 mg Nebulization BID   Chlorhexidine Gluconate Cloth  6 each Topical Q0600   escitalopram  10 mg Oral QHS   feeding supplement (GLUCERNA SHAKE)  237 mL Oral BID BM   gabapentin  100 mg Oral BID   guaiFENesin  1,200 mg Oral BID   insulin aspart  0-20 Units Subcutaneous TID WC   insulin aspart  0-5 Units Subcutaneous QHS   insulin aspart  10 Units Subcutaneous TID WC   insulin glargine-yfgn  26 Units Subcutaneous QHS   ipratropium-albuterol  3 mL Nebulization TID   mouth rinse  15 mL Mouth Rinse BID   metoprolol tartrate  25 mg Oral BID   mirtazapine  30 mg Oral QHS   multivitamin with minerals  1 tablet Oral  Daily   potassium chloride  40 mEq Oral BID   predniSONE  40 mg Oral Q breakfast   Ensure Max Protein  11 oz Oral Daily   zinc sulfate  220 mg Oral Daily     Subjective:   Anthony [name] was seen and examined today.  Cough is better however difficult to wean O2.  No chest pain.  Yesterday evening in atrial fibrillation with RVR briefly, now normal sinus rhythm  Objective:   Vitals:   02/21/21 0843 02/21/21 0903 02/21/21 1200 02/21/21 1416  BP:  135/89    Pulse:  73    Resp:      Temp:   97.6 F (36.4 C)   TempSrc:   Oral  SpO2: (!) 89%   95%  Weight:      Height:        Intake/Output Summary (Last 24 hours) at 02/21/2021 1513 Last data filed at 02/21/2021 0845 Gross per 24 hour  Intake 303.75 ml  Output 1600 ml  Net -1296.25 ml   Filed Weights   02/12/21 1823  Weight: 87.2 kg     Exam General: Alert and oriented x 3, NAD Cardiovascular: S1 S2 auscultated, RRR Respiratory: Coarse breath sounds bilaterally Gastrointestinal: Soft, nontender, nondistended, + bowel sounds Ext: no pedal edema, right, L AKA Neuro: no new deficits Skin: No rashes  Data Reviewed:  I have personally reviewed following labs    CBC Lab Results  Component Value Date   WBC 15.0 (H) 02/20/2021   RBC 5.80 02/20/2021   HGB 17.3 (H) 02/20/2021   HCT 51.4 02/20/2021   MCV 88.6 02/20/2021   MCH 29.8 02/20/2021   PLT 290 02/20/2021   MCHC 33.7 02/20/2021   RDW 12.5 02/20/2021   LYMPHSABS 0.7 02/17/2021   MONOABS 1.1 (H) 02/17/2021   EOSABS 0.0 02/17/2021   BASOSABS 0.1 86/76/7209     Last metabolic panel Lab Results  Component Value Date   NA 137 02/21/2021   K 3.7 02/21/2021   CL 99 02/21/2021   CO2 32 02/21/2021   BUN 43 (H) 02/21/2021   CREATININE 0.74 02/21/2021   GLUCOSE 138 (H) 02/21/2021   GFRNONAA >60 02/21/2021   GFRAA 84 10/30/2019   CALCIUM 8.8 (L) 02/21/2021   PROT 6.3 (L) 02/17/2021   ALBUMIN 3.1 (L) 02/17/2021   LABGLOB 2.9 07/02/2018   AGRATIO 1.6  07/02/2018   BILITOT 0.4 02/17/2021   ALKPHOS 56 02/17/2021   AST 16 02/17/2021   ALT 24 02/17/2021   ANIONGAP 6 02/21/2021    CBG (last 3)  Recent Labs    02/20/21 1946 02/21/21 0841 02/21/21 1211  GLUCAP 204* 135* 182*      Coagulation Profile: No results for input(s): INR, PROTIME in the last 168 hours.   Radiology Studies: I have personally reviewed the imaging studies    ECHOCARDIOGRAM COMPLETE BUBBLE STUDY  Result Date: 02/21/2021    ECHOCARDIOGRAM REPORT   Patient Name:   Anthony [name]. Date of Exam: 02/21/2021 Medical Rec #:  [id number]          Height:       69.0 in Accession #:    [id number]         Weight:       192.2 lb Date of Birth:  1950-07-01          BSA:          2.032 m Patient Age:    57 years           BP:           112/78 mmHg Patient Gender: M                  HR:           69 bpm. Exam Location:  Inpatient Procedure: 2D Echo, Cardiac Doppler, Color Doppler and Saline Contrast Bubble            Study Indications:    CHF/ dyspnea  History:        Patient has no prior history of Echocardiogram examinations. CAD                 and Previous Myocardial Infarction, COPD; Risk  Factors:Dyslipidemia, Hypertension and Diabetes.  Sonographer:    Jonelle Sidle CROWN Referring Phys: 80 Kanon [place] K Ludene Stokke  Sonographer Comments: Technically difficult study due to poor echo windows. IMPRESSIONS  1. Non-diagnostic bubble study despite 3 attempts. Would recommend TCDs or TEE if clinically indicated.  2. Left ventricular ejection fraction, by estimation, is 60 to 65%. The left ventricle has normal function. The left ventricle has no regional wall motion abnormalities. Left ventricular diastolic parameters were normal.  3. Right ventricular systolic function is normal. The right ventricular size is normal. Tricuspid regurgitation signal is inadequate for assessing PA pressure.  4. The mitral valve is grossly normal. No evidence of mitral valve regurgitation. No evidence of  mitral stenosis.  5. The aortic valve is tricuspid. There is mild calcification of the aortic valve. Aortic valve regurgitation is not visualized. No aortic stenosis is present.  6. The inferior vena cava is normal in size with greater than 50% respiratory variability, suggesting right atrial pressure of 3 mmHg. Conclusion(s)/Recommendation(s): No intracardiac source of embolism detected on this transthoracic study. Consider a transesophageal echocardiogram to exclude cardiac source of embolism if clinically indicated. FINDINGS  Left Ventricle: Left ventricular ejection fraction, by estimation, is 60 to 65%. The left ventricle has normal function. The left ventricle has no regional wall motion abnormalities. The left ventricular internal cavity size was normal in size. There is  no left ventricular hypertrophy. Left ventricular diastolic parameters were normal. Right Ventricle: The right ventricular size is normal. No increase in right ventricular wall thickness. Right ventricular systolic function is normal. Tricuspid regurgitation signal is inadequate for assessing PA pressure. Left Atrium: Left atrial size was normal in size. Right Atrium: Right atrial size was normal in size. Pericardium: Trivial pericardial effusion is present. Presence of epicardial fat layer. Mitral Valve: The mitral valve is grossly normal. No evidence of mitral valve regurgitation. No evidence of mitral valve stenosis. MV peak gradient, 1.9 mmHg. The mean mitral valve gradient is 1.0 mmHg. Tricuspid Valve: The tricuspid valve is grossly normal. Tricuspid valve regurgitation is not demonstrated. No evidence of tricuspid stenosis. Aortic Valve: The aortic valve is tricuspid. There is mild calcification of the aortic valve. Aortic valve regurgitation is not visualized. No aortic stenosis is present. Aortic valve mean gradient measures 2.5 mmHg. Aortic valve peak gradient measures 4.5 mmHg. Aortic valve area, by VTI measures 4.39 cm. Pulmonic  Valve: The pulmonic valve was grossly normal. Pulmonic valve regurgitation is not visualized. No evidence of pulmonic stenosis. Aorta: The aortic root and ascending aorta are structurally normal, with no evidence of dilitation. Venous: The inferior vena cava is normal in size with greater than 50% respiratory variability, suggesting right atrial pressure of 3 mmHg. IAS/Shunts: The atrial septum is grossly normal. Agitated saline contrast was given intravenously to evaluate for intracardiac shunting.  LEFT VENTRICLE PLAX 2D LVIDd:         5.10 cm     Diastology LVIDs:         3.70 cm     LV e' medial:    7.18 cm/s LV PW:         1.20 cm     LV E/e' medial:  7.0 LV IVS:        1.10 cm     LV e' lateral:   10.30 cm/s LVOT diam:     2.50 cm     LV E/e' lateral: 4.9 LV SV:         79 LV SV Index:   39  LVOT Area:     4.91 cm  LV Volumes (MOD) LV vol d, MOD A2C: 49.6 ml LV vol d, MOD A4C: 87.7 ml LV vol s, MOD A2C: 18.3 ml LV vol s, MOD A4C: 30.3 ml LV SV MOD A2C:     31.3 ml LV SV MOD A4C:     87.7 ml LV SV MOD BP:      45.9 ml RIGHT VENTRICLE RV Basal diam:  4.50 cm RV Mid diam:    2.90 cm RV S prime:     19.40 cm/s TAPSE (M-mode): 3.4 cm LEFT ATRIUM             Index LA diam:        4.50 cm 2.22 cm/m LA Vol (A2C):   22.8 ml 11.22 ml/m LA Vol (A4C):   35.4 ml 17.43 ml/m LA Biplane Vol: 31.1 ml 15.31 ml/m  AORTIC VALVE                    PULMONIC VALVE AV Area (Vmax):    4.28 cm     PV Vmax:       0.85 m/s AV Area (Vmean):   4.17 cm     PV Vmean:      59.700 cm/s AV Area (VTI):     4.39 cm     PV VTI:        0.171 m AV Vmax:           105.55 cm/s  PV Peak grad:  2.9 mmHg AV Vmean:          68.650 cm/s  PV Mean grad:  2.0 mmHg AV VTI:            0.180 m AV Peak Grad:      4.5 mmHg AV Mean Grad:      2.5 mmHg LVOT Vmax:         92.10 cm/s LVOT Vmean:        58.300 cm/s LVOT VTI:          0.161 m LVOT/AV VTI ratio: 0.89  AORTA Ao Root diam: 3.30 cm Ao Asc diam:  3.30 cm MITRAL VALVE MV Area (PHT): 3.08 cm    SHUNTS  MV Area VTI:   4.14 cm    Systemic VTI:  0.16 m MV Peak grad:  1.9 mmHg    Systemic Diam: 2.50 cm MV Mean grad:  1.0 mmHg MV Vmax:       0.68 m/s MV Vmean:      41.6 cm/s MV Decel Time: 246 msec MV E velocity: 50.10 cm/s MV A velocity: 69.00 cm/s MV E/A ratio:  0.73 Eleonore Chiquito MD Electronically signed by Eleonore Chiquito MD Signature Date/Time: 02/21/2021/2:29:35 PM    Final        Estill Cotta M.D. [facility] 02/21/2021, 3:13 PM  Available via Epic secure chat 7am-7pm After 7 pm, please refer to night coverage provider listed on amion.

## 2021-02-21 NOTE — TOC Progression Note (Signed)
Transition of Care (TOC) - Progression Note    Patient Details  Name: Anthony [name]. MRN: [id number] Date of Birth: 1950/06/18  Transition of Care [facility]) CM/SW Contact  Ross Ludwig, [place] Phone Number: 02/21/2021, 10:32 AM  Clinical Narrative:     CSW spoke to [place] at [employer] regarding screening patient.  Per Anderson Malta he does meet criteria for LTAC.  CSW spoke to attending  physician and she is in agreement with trying to have patient go to [place].  CSW asked bedside nurse to speak to patient to see if he is interested.  Patient did say he is interested in going to [facility] and then eventually returning back to [facility].  CSW attempted to call patient's family, but had to leave a message awaiting for call back.  CSW contacted Bernadene Bell to withdraw SNF authorization so [facility] can start insurance authorization. CSW updated [place] at [employer] and [place] at [facility] to start insurance authorization for [facility].  CSW updated bedside nurse.   Expected Discharge Plan: [place] Barriers to Discharge: Continued Medical Work up  Expected Discharge Plan and Services Expected Discharge Plan: [place] In-house Referral: Clinical Social Work   Post Acute Care Choice: [place] Living arrangements for the past 2 months: [place]                 DME Arranged: N/A DME Agency: NA                   Social Determinants of Health (SDOH) Interventions    Readmission Risk Interventions Readmission Risk Prevention Plan 02/13/2021  Transportation Screening Complete  PCP or Specialist Appt within 5-7 Days Complete  Home Care Screening Complete  Medication Review (RN CM) Complete  Some recent data might be hidden

## 2021-02-21 NOTE — Assessment & Plan Note (Addendum)
-   Patient noted to be in A-fib with RVR with heart rate in 140s, started on beta-blocker 25 mg p.o. twice daily -Currently normal sinus rhythm, on Eliquis - cardiology following as well. Echo on 2/14 shows normal EF, no diastolic dysfunction

## 2021-02-22 DIAGNOSIS — J9601 Acute respiratory failure with hypoxia: Secondary | ICD-10-CM

## 2021-02-22 LAB — GLUCOSE, CAPILLARY
Glucose-Capillary: 160 mg/dL — ABNORMAL HIGH (ref 70–99)
Glucose-Capillary: 247 mg/dL — ABNORMAL HIGH (ref 70–99)
Glucose-Capillary: 189 mg/dL — ABNORMAL HIGH (ref 70–99)
Glucose-Capillary: 318 mg/dL — ABNORMAL HIGH (ref 70–99)

## 2021-02-22 LAB — LIPID PANEL
Cholesterol: 105 mg/dL (ref 0–200)
HDL: 38 mg/dL — ABNORMAL LOW (ref >40)
LDL Cholesterol: 49 mg/dL (ref 0–99)
Total CHOL/HDL Ratio: 2.8 RATIO
Triglycerides: 89 mg/dL (ref <150)
VLDL: 18 mg/dL (ref 0–40)

## 2021-02-22 LAB — BASIC METABOLIC PANEL
Anion gap: 7 (ref 5–15)
BUN: 39 mg/dL — ABNORMAL HIGH (ref 8–23)
CO2: 31 mmol/L (ref 22–32)
Calcium: 8.6 mg/dL — ABNORMAL LOW (ref 8.9–10.3)
Chloride: 100 mmol/L (ref 98–111)
Creatinine, Ser: 0.64 mg/dL (ref 0.61–1.24)
GFR, Estimated: >60 mL/min (ref >60)
Glucose, Bld: 115 mg/dL — ABNORMAL HIGH (ref 70–99)
Potassium: 3.9 mmol/L (ref 3.5–5.1)
Sodium: 138 mmol/L (ref 135–145)

## 2021-02-22 LAB — MAGNESIUM: Magnesium: 2.5 mg/dL — ABNORMAL HIGH (ref 1.7–2.4)

## 2021-02-22 LAB — TSH: TSH: 1.708 u[IU]/mL (ref 0.350–4.500)

## 2021-02-22 MED ORDER — FUROSEMIDE 10 MG/ML IJ SOLN
40.0000 mg | Freq: Once | INTRAMUSCULAR | Status: AC
  Administered 2021-02-22: 40 mg via INTRAVENOUS
  Filled 2021-02-22: qty 4

## 2021-02-22 NOTE — Progress Notes (Signed)
CPT on hold due to pt in chair. Pt also refusing to take meds. RN at bedside. No distress noted.

## 2021-02-22 NOTE — Progress Notes (Signed)
CPT on hold due to pt just received his lunch.

## 2021-02-22 NOTE — Progress Notes (Signed)
Occupational Therapy Progress Note  Patient still confused. Believes it is almost 9PM instead of AM. Also stating his daughter was just here trying to help him figure out the TV, when asked where she lives states "here." Does not elaborate. With encouragement patient agreeable to transfer to recliner chair. Patient needing mod A for bed mobility to upright trunk and mod +2 to power up to standing from edge of bed. With increased time patient able to take small steps to recliner and mod A for eccentric control to sitting. O2 sats maintained 88-91% on 15L HFNC. Update D/C recommendations LTACH. Acute OT to follow.    02/22/21 0900  OT Visit Information  Last OT Received On 02/22/21  Assistance Needed +2  PT/OT/SLP Co-Evaluation/Treatment Yes  Reason for Co-Treatment For patient/therapist safety;To address functional/ADL transfers  PT goals addressed during session Mobility/safety with mobility  OT goals addressed during session ADL's and self-care  History of Present Illness Patient is a 71 year old male who resides at [facility] and admitted for acute respiratory failure 2* COVID. PMH includes L AKA, hx falls  Precautions  Precautions Fall  Precaution Comments multiple B rib fractures, Lt AKA, on HFNC 15 L  Restrictions  Weight Bearing Restrictions No  Pain Assessment  Pain Assessment Faces  Faces Pain Scale 0  Cognition  Arousal/Alertness Awake/alert  Behavior During Therapy Flat affect  Overall Cognitive Status No family/caregiver present to determine baseline cognitive functioning  General Comments Patient stating his daughter was just here trying to figure out the TV, when asked where she lives states "here"  ADL  Overall ADL's  Needs assistance/impaired  Eating/Feeding Independent;Sitting  Grooming Wash/dry face;Wash/dry hands;Set up;Sitting  Toilet Transfer Moderate assistance;+2 for safety/equipment;Stand-pivot;Cueing for safety;Cueing for sequencing  Toilet Transfer Details  (indicate cue type and reason) Patient needing mod A +2 to power up to standing from edge of bed. Needing increased time to take small hops from bed to recliner chair and mod A for eccentric control to sitting  Functional mobility during ADLs Moderate assistance;+2 for physical assistance;+2 for safety/equipment;Cueing for safety;Rolling walker (2 wheels)  General ADL Comments Patient O2 maintained 88-91% on 15L HFNC  Bed Mobility  Overal bed mobility Needs Assistance  Bed Mobility Supine to Sit  Supine to sit Mod assist  General bed mobility comments mod A to upright trunk  Balance  Overall balance assessment History of Falls;Needs assistance  Sitting-balance support Feet supported  Sitting balance-Leahy Scale Fair  Standing balance support Reliant on assistive device for balance  Standing balance-Leahy Scale Poor  OT - End of Session  Equipment Utilized During Treatment Oxygen;Rolling walker (2 wheels)  Activity Tolerance Patient tolerated treatment well  Patient left in chair;with call bell/phone within reach;with chair alarm set  Nurse Communication Mobility status  OT Assessment/Plan  OT Plan Discharge plan needs to be updated  OT Visit Diagnosis Other abnormalities of gait and mobility (R26.89);Muscle weakness (generalized) (M62.81);History of falling (Z91.81)  OT Frequency (ACUTE ONLY) Min 2X/week  Follow Up Recommendations OT at Long-term acute care hospital  Assistance recommended at discharge Frequent or constant Supervision/Assistance  Patient can return home with the following A lot of help with walking and/or transfers;A lot of help with bathing/dressing/bathroom;Assistance with cooking/housework;Direct supervision/assist for medications management;Direct supervision/assist for financial management;Assist for transportation;Help with stairs or ramp for entrance  OT Equipment None recommended by OT  AM-PAC OT "6 Clicks" Daily Activity Outcome Measure (Version 2)  Help from  another person eating meals? 4  Help from  another person taking care of personal grooming? 3  Help from another person toileting, which includes using toliet, bedpan, or urinal? 2  Help from another person bathing (including washing, rinsing, drying)? 2  Help from another person to put on and taking off regular upper body clothing? 3  Help from another person to put on and taking off regular lower body clothing? 2  6 Click Score 16  Progressive Mobility  What is the highest level of mobility based on the progressive mobility assessment? Level 2 (Chairfast) - Balance while sitting on edge of bed and cannot stand  Activity Transferred from bed to chair  OT Goal Progression  Progress towards OT goals Progressing toward goals  Acute Rehab OT Goals  Patient Stated Goal watch TV  OT Goal Formulation With patient  Time For Goal Achievement 03/02/21  Potential to Achieve Goals Good  ADL Goals  Pt Will Transfer to Toilet with supervision;stand pivot transfer;bedside commode  Additional ADL Goal #1 Patient will tolerate 15 minutes of out of bed activity in order to participate in daily self care.  Additional ADL Goal #2 Patient will implement pursed lip breathing strategies with min cues in order to maintain O2 saturations during self care activities.  OT Time Calculation  OT Start Time (ACUTE ONLY) 0813  OT Stop Time (ACUTE ONLY) 0836  OT Time Calculation (min) 23 min  OT General Charges  $OT Visit 1 Visit  OT Treatments  $Self Care/Home Management  8-22 mins   Delbert Phenix OT OT pager: [phone]

## 2021-02-22 NOTE — Progress Notes (Signed)
Occupational Therapy Evaluation  Late Entry-note did not flow over from 02/16/21 when evaluation was completed.  Patient is a resident from [facility] and questionable historian. Patient reports he could pivot himself to wheelchair or use a walker to hop few steps to transfer to wheelchair however chart review states multiple falls. Also unsure how much assistance patient receives at baseline with self care tasks. Currently patient is mod+2 to hop few steps to recliner chair with walker. Recommend continued acute OT services to reduce caregiver burden and return to SNF residence.    02/16/21 1400  OT Visit Information  Last OT Received On 02/16/21  Assistance Needed +2 (safety)  PT/OT/SLP Co-Evaluation/Treatment Yes  Reason for Co-Treatment To address functional/ADL transfers;For patient/therapist safety  PT goals addressed during session Mobility/safety with mobility  OT goals addressed during session ADL's and self-care  History of Present Illness Patient is a 71 year old male who resides at [facility] and admitted for acute respiratory failure 2* COVID. PMH includes L AKA, hx falls  Precautions  Precautions Fall  Precaution Comments multiple B rib fractures  Restrictions  Weight Bearing Restrictions No  Home Living  Family/patient expects to be discharged to: Skilled nursing facility  Prior Function  Prior Level of Function  Patient poor historian/Family not available  Mobility Comments per patient he could transfer himself to the wheelchair  ADLs Comments poor historian, unsure of level of assist at [facility]  Communication  Communication No difficulties  Pain Assessment  Pain Assessment Faces  Faces Pain Scale 0  Cognition  Arousal/Alertness Awake/alert  Behavior During Therapy [facility] for tasks assessed/performed  Overall Cognitive Status No family/caregiver present to determine baseline cognitive functioning  General Comments When asked where he lives patient states "here,"  initially reports month is April and cannot state the current year. WHen asked if he uses a wheelchair or walker patient replies "yes"  Upper Extremity Assessment  Upper Extremity Assessment Generalized weakness  Lower Extremity Assessment  Lower Extremity Assessment Defer to PT evaluation  ADL  Overall ADL's  Needs assistance/impaired  Eating/Feeding Independent;Sitting  Grooming Set up;Sitting  Upper Body Bathing Set up;Sitting  Lower Body Bathing Moderate assistance;Sitting/lateral leans;Sit to/from stand;Bed level  Upper Body Dressing  Set up;Sitting  Lower Body Dressing Maximal assistance;Sitting/lateral leans;Sit to/from stand;Bed level  Toilet Transfer Moderate assistance;+2 for safety/equipment;Stand-pivot;Cueing for safety;Cueing for sequencing  Toilet Transfer Details (indicate cue type and reason) Mod A to power up to standing +2 for safety. Able to hop few steps with walker  Toileting- Clothing Manipulation and Hygiene Total assistance;Sitting/lateral lean;Sit to/from stand;Bed level  Functional mobility during ADLs Moderate assistance;+2 for safety/equipment;Cueing for safety;Cueing for sequencing;Rolling walker (2 wheels)  Bed Mobility  Overal bed mobility Needs Assistance  Bed Mobility Supine to Sit  Supine to sit Min guard;HOB elevated  General bed mobility comments Use of bed rail  Balance  Overall balance assessment History of Falls;Needs assistance  Sitting-balance support Feet supported  Sitting balance-Leahy Scale Fair  Standing balance support Bilateral upper extremity supported;Reliant on assistive device for balance  Standing balance-Leahy Scale Poor  General Comments  General comments (skin integrity, edema, etc.) Patient on 15L HFNC, briefly dropped to low 80s with activity and coughing however quickly recovered in sitting  OT - End of Session  Equipment Utilized During Treatment Oxygen;Rolling walker (2 wheels);Gait belt  Activity Tolerance Patient tolerated  treatment well  Patient left in chair;with call bell/phone within reach;with chair alarm set  Nurse Communication Mobility status  OT Assessment  OT Recommendation/Assessment Patient needs continued OT Services  OT Visit Diagnosis Other abnormalities of gait and mobility (R26.89);Muscle weakness (generalized) (M62.81);History of falling (Z91.81)  OT Problem List Decreased strength;Decreased activity tolerance;Impaired balance (sitting and/or standing);Decreased cognition;Decreased safety awareness;Decreased knowledge of use of DME or AE;Cardiopulmonary status limiting activity  OT Plan  OT Frequency (ACUTE ONLY) Min 2X/week  OT Treatment/Interventions (ACUTE ONLY) Self-care/ADL training;Therapeutic exercise;Therapeutic activities;Patient/family education;Balance training;Cognitive remediation/compensation  AM-PAC OT "6 Clicks" Daily Activity Outcome Measure (Version 2)  Help from another person eating meals? 4  Help from another person taking care of personal grooming? 3  Help from another person toileting, which includes using toliet, bedpan, or urinal? 2  Help from another person bathing (including washing, rinsing, drying)? 2  Help from another person to put on and taking off regular upper body clothing? 3  Help from another person to put on and taking off regular lower body clothing? 2  6 Click Score 16  Progressive Mobility  What is the highest level of mobility based on the progressive mobility assessment? Level 3 (Stands with assist) - Balance while standing  and cannot march in place  Activity Transferred from bed to chair  OT Recommendation  Follow Up Recommendations Skilled nursing-short term rehab (<3 hours/day)  Assistance recommended at discharge Frequent or constant Supervision/Assistance  Patient can return home with the following A lot of help with walking and/or transfers;A lot of help with bathing/dressing/bathroom;Assistance with cooking/housework;Direct supervision/assist  for medications management;Direct supervision/assist for financial management;Assist for transportation;Help with stairs or ramp for entrance  Functional Status Assessent Patient has had a recent decline in their functional status and demonstrates the ability to make significant improvements in function in a reasonable and predictable amount of time.  OT Equipment None recommended by OT  Individuals Consulted  Consulted and Agree with Results and Recommendations Patient  Acute Rehab OT Goals  Patient Stated Goal Eat breakfast  OT Goal Formulation With patient  Time For Goal Achievement 03/02/21  Potential to Achieve Goals Good  OT Time Calculation  OT Start Time (ACUTE ONLY) 1043  OT Stop Time (ACUTE ONLY) 1108  OT Time Calculation (min) 25 min  OT General Charges  $OT Visit 1 Visit  OT Evaluation  $OT Eval Moderate Complexity 1 Mod  Written Expression  Dominant Hand  (did not specify)    Delbert Phenix OT OT pager: [phone]

## 2021-02-22 NOTE — Progress Notes (Signed)
NAME:  Anthony [name]., MRN:  [id number], DOB:  01/11/1950, LOS: 44 ADMISSION DATE:  02/12/2021, CONSULTATION DATE:  2/10 REFERRING MD:  Dr. Tana Coast, CHIEF COMPLAINT:  SOB   History of Present Illness:  71 y/o M who presented to [facility] ER on 2/5 with reports of SOB and low oxygen saturations.   The patient resides at [facility].  He tested positive for COVID on 1/27 and began having respiratory difficulties on 1/29.  EMS was called for evaluation on 2/5 and found the patient to have saturations of 78% on arrival.  He reportedly had been experiencing multiple falls at the facility after left AKA.  Per notes, his baseline at the facility is independent ambulation, self performs ADL's without assistance, no hx of memory issues.   On ER arrival, the patient was requiring O2 but sats had improved.  He was evaluated with a CT of the chest to rule out PE which was negative for PE but showed bilateral paraseptal emphysema, bilateral peripheral chronic interstitial lung disease, pulmonary nodules, multiple bilateral rib fractures.  The patient required 15L [place] O2 and intermittent use of NRB when sleeping.  PCT negative.  H was treated with remdesivir, oxygen, & solumedrol. The patients O2 needs remained persistently elevated.  He was diuresed with lasix.  The patient elected for DNI status on admission.    PCCM consulted 2/10 for evaluation of hypoxic respiratory failure.   Pulmonary hx: began smoking age 21, smoked up to 2ppd at his heaviest, quit smoking 1 year ago (2022) when he went into the SNF after his amputation.  Reports he is supposed to be on oxygen at night but the facility does provide it for him.  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   Pertinent  Medical History  Bipolar Depression / Anxiety  HTN HLD CAD s/p MI 2013 Nocturnal Hypoxemia - seen on sleep study 2020, no apnea  Tobacco Abuse  COPD - reported, no formal PFT's on file  DM - Hgb  A1c 7.7 02/12/21  Hx DVT - traumatic, s/p coumadin therapy  Vitamin D deficiency  Urinary Frequency  Left AKA - in 2022  Significant Hospital Events: Including procedures, antibiotic start and stop dates in addition to other pertinent events   2/5 Admit  2/10 PCCM consulted for hypoxic respiratory failure  2/11 LE duplex with extensive R DVT  Interim History / Subjective:  No issues overnight. Except him needing redirection for being confused   Objective   Blood pressure 115/60, pulse 76, temperature 97.6 F (36.4 C), temperature source Axillary, resp. rate 16, height 5\' 9"  (1.753 m), weight 87.2 kg, SpO2 90 %.        Intake/Output Summary (Last 24 hours) at 02/22/2021 1144 Last data filed at 02/22/2021 1000 Gross per 24 hour  Intake 360 ml  Output 400 ml  Net -40 ml   Filed Weights   02/12/21 1823  Weight: 87.2 kg    Examination: Elderly chronically ill-appearing gentleman HEENT: Tracking appropriately Neuro: Alert to person is not oriented to place or time Heart: Regular rate rhythm S1-S2 Lungs: Clear to auscultation bilaterally Extremities: AKA on the left  Echo, Nondiagnostic bubble study  Resolved Hospital Problem list      Assessment & Plan:   Acute hypoxemic respiratory failure- COVID and likely pulmonary micro-emboli on top of R hemidiaphragm dysfunction/paralysis and underlying UIP.  Pulmonary nodules POA Hx of prior DVT Hx tobacco abuse DM2 with  hyperglycemia Hx CAD Hx PVD s/p L AKA Situational depression/anxiety-   Plan: Daily I-S As needed Lasix, additional dose Lasix today, follow urine output Complete 10 days of prednisone, still on p.o. prednisone Discontinued BiPAP due to patient refusal. Continue Eliquis, history of presumed Xarelto failure Lexapro plus as needed Atarax Awaiting possible LTAC placement. We will need prolonged wean from O2 support. Seems to do better on heated high flow.  Pulmonary will see patient as needed.  Feel free  to reach out.  Best Practice (right click and "Reselect all SmartList Selections" daily)  Per Primary   Garner Nash, DO [facility] 02/22/2021 11:44 AM

## 2021-02-22 NOTE — Progress Notes (Signed)
Progress Note   Patient: Anthony [name]. KCL:[id number] DOB: 15-Nov-1950 DOA: 02/12/2021     10 DOS: the patient was seen and examined on 02/22/2021   Brief hospital course: Anthony [name] is a 71 yo male with PMH CAD, PAD, HLD, COPD, bipolar, DM II who presented with worsening dyspnea at home.  He resides at a skilled nursing facility and was recently diagnosed with [place] outpatient around approximately 02/03/2021.  Due to becoming hypoxic, he was sent to the ER for further evaluation. He also underwent left AKA on 09/21/2020 and has had recurrent falls since. He was positive for COVID-19 on evaluation in the ER as well as significantly hypoxic requiring nonrebreather.  He was started on remdesivir, steroids, and breathing treatments. He was also evaluated for trauma from his falls and found to have multiple rib fractures bilaterally.  Difficult to wean O2, pulmonology following.  Assessment and Plan: * Acute respiratory failure with hypoxia ([place])- (present on admission) - Chronic underlying lung disease, states he is on O2 at home. CTA chest shows bilateral paraseptal emphysema with chronic ILD.  Also has multiple pulmonary nodules.  No PE. -Etiology for worsening hypoxia felt due to exacerbation of underlying COPD as well as superimposed COVID infection - Difficult to wean O2, still on 15 L HFNC -On eliquis anticoagulation - echo on 2/14 shows EF 60-65%, normal diastology  Paroxysmal atrial fibrillation with RVR ([place])- (present on admission) - Patient noted to be in A-fib with RVR with heart rate in 140s, started on beta-blocker 25 mg p.o. twice daily -Currently normal sinus rhythm, on Eliquis - cardiology following as well. 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 prednisone, Pulmicort, intermittent Lasix  COPD (chronic obstructive pulmonary disease) ([place])- (present on admission) -Mild exacerbation in the setting of ongoing COVID infection  --Chest x-ray showed bibasilar left greater than right areas of atelectasis or consolidation.  Procalcitonin <0.1 -Pulmonology following, BiPAP nightly, s/p trial of albumin/Lasix  History of DVT (deep vein thrombosis) Patient was on Xarelto prior to admission. -Venous Doppler showed a new DVT in the right lower extremity from proximal femoral to the popliteal and in single posterior tibial vein.   Also has bilateral arterial disease with occlusion of the proximal-distal LT SFA and a focal dilitation of the proximal RT SFA to 2.5 cm -Patient has known history of PAD.  Placed on therapeutic eliquis.    Hypokalemia - Replete PRN  Rib fractures - Likely due to recurrent falls after his left AKA - CTA chest shows left 3rd - 5th and left 5th and 9th rib fractures -Continue incentive spirometry   HTN (hypertension) - BP stable  Bipolar 1 disorder (HCC) - continue mirtazapine,  -Flat affect, pulmonology starting Lexapro and as needed Atarax  Multiple lung nodules- (present on admission) - Appears to be known from 2020 - CTA chest shows 6 mm left lower lobe, 3 and 5 mm right middle lobe - appears stable/smaller from prior imagining, continue outpatient monitoring -Will need outpatient pulmonology follow-up   DM2 (diabetes mellitus, type 2) (HCC) - A1c 7.7% on admission - CBGs elevated due to steroids, improving since transition to oral prednisone. -Continue Semglee and SSI   Hyperlipidemia associated with type 2 diabetes mellitus ([place])- (present on admission) - Continue Lipitor  CAD (coronary  artery disease)- (present on admission) -Currently on aspirin, beta-blocker, statin, eliquis      Subjective: Sitting up in recliner this morning.  Slightly depressed mood from prolonged hospitalization.   Encouraged him to continue taking medications as prescribed as he has been refusing at times.  Physical Exam: Vitals:   02/22/21 1000 02/22/21 1140 02/22/21 1235 02/22/21 1400  BP: 115/60  (!) 132/95 (!) 119/46  Pulse: 76  65 (!) 58  Resp: 16   19  Temp:  98.3 F (36.8 C)    TempSrc:  Oral    SpO2: 90%  (!) 86% (!) 76%  Weight:      Height:      Physical Exam Constitutional:      General: He is not in acute distress.    Comments: Fatigued but no distress  HENT:     Head: Normocephalic and atraumatic.     Mouth/Throat:     Mouth: Mucous membranes are moist.  Eyes:     Extraocular Movements: Extraocular movements intact.  Cardiovascular:     Rate and Rhythm: Normal rate and regular rhythm.     Heart sounds: Normal heart sounds.  Pulmonary:     Effort: Pulmonary effort is normal. No respiratory distress.     Comments: Bilateral coarse breath sounds Abdominal:     General: Bowel sounds are normal. There is no distension.     Palpations: Abdomen is soft.     Tenderness: There is no abdominal tenderness.  Musculoskeletal:        General: Normal range of motion.     Cervical back: Normal range of motion and neck supple.  Skin:    General: Skin is warm and dry.  Neurological:     General: No focal deficit present.  Psychiatric:        Mood and Affect: Mood normal.     Data Reviewed:  I have Reviewed nursing notes, Vitals, and Lab results since pt's last encounter. Pertinent lab results : see below I have ordered test including BMP, CBC, Mg I have reviewed the last note from staff over past 24 hours,  I have discussed pt's care plan and test results with nursing staff, CM. Results for orders placed or performed during the hospital encounter of 02/12/21 (from the past 24 hour(s))  Glucose, capillary     Status: Abnormal   Collection Time: 02/21/21  5:39 PM  Result Value Ref Range   Glucose-Capillary 181 (H) 70 - 99 mg/dL  Glucose, capillary     Status: Abnormal    Collection Time: 02/21/21  9:33 PM  Result Value Ref Range   Glucose-Capillary 214 (H) 70 - 99 mg/dL  Magnesium     Status: Abnormal   Collection Time: 02/22/21  3:29 AM  Result Value Ref Range   Magnesium 2.5 (H) 1.7 - 2.4 mg/dL  Basic metabolic panel     Status: Abnormal   Collection Time: 02/22/21  3:29 AM  Result Value Ref Range   Sodium 138 135 - 145 mmol/L   Potassium 3.9 3.5 - 5.1 mmol/L   Chloride 100 98 - 111 mmol/L   CO2 31 22 - 32 mmol/L   Glucose, Bld 115 (H) 70 - 99 mg/dL   BUN 39 (H) 8 - 23 mg/dL   Creatinine, Ser 0.64 0.61 - 1.24 mg/dL   Calcium 8.6 (L) 8.9 - 10.3 mg/dL   GFR, Estimated >60 >60 mL/min   Anion gap 7 5 - 15  TSH  Status: None   Collection Time: 02/22/21  3:29 AM  Result Value Ref Range   TSH 1.708 0.350 - 4.500 uIU/mL  Lipid panel     Status: Abnormal   Collection Time: 02/22/21  3:29 AM  Result Value Ref Range   Cholesterol 105 0 - 200 mg/dL   Triglycerides 89 <150 mg/dL   HDL 38 (L) >40 mg/dL   Total CHOL/HDL Ratio 2.8 RATIO   VLDL 18 0 - 40 mg/dL   LDL Cholesterol 49 0 - 99 mg/dL  Glucose, capillary     Status: Abnormal   Collection Time: 02/22/21  8:55 AM  Result Value Ref Range   Glucose-Capillary 160 (H) 70 - 99 mg/dL   Comment 1 Notify RN    Comment 2 Document in Chart   Glucose, capillary     Status: Abnormal   Collection Time: 02/22/21 12:18 PM  Result Value Ref Range   Glucose-Capillary 247 (H) 70 - 99 mg/dL   Comment 1 Notify RN    Comment 2 Document in Chart      Family Communication:   Disposition: Status is: Inpatient Remains inpatient appropriate because: Treatment as outlined in A&P    Planned Discharge Destination: LTAC  DVT ppx: Eliquis    Author: Dwyane Dee, MD 02/22/2021 3:03 PM  For on call review [email or web address].

## 2021-02-22 NOTE — Progress Notes (Signed)
Physical Therapy Treatment Patient Details Name: Anthony [name]. MRN: [id number] DOB: Jun 09, 1950 Today's Date: 02/22/2021   History of Present Illness Patient is a 71 year old male who resides at [facility] and admitted for acute respiratory failure 2* COVID. PMH includes L AKA, hx falls    PT Comments    Patient alert today, required a little encouragement to transfer to recliner , mod assist of 1 and 1 person safety and lines.  Patient maintained SPO2 88-91% on 15 L HFNC. Continue  mobility with PT.  Recommendations for follow up therapy are one component of a multi-disciplinary discharge planning process, led by the attending physician.  Recommendations may be updated based on patient status, additional functional criteria and insurance authorization.  Follow Up Recommendations  PT at Long-term acute care hospital     Assistance Recommended at Discharge Frequent or constant Supervision/Assistance  Patient can return home with the following A lot of help with bathing/dressing/bathroom;A lot of help with walking and/or transfers;Assistance with cooking/housework;Direct supervision/assist for medications management;Assist for transportation;Direct supervision/assist for financial management;Help with stairs or ramp for entrance   Equipment Recommendations  None recommended by PT    Recommendations for Other Services       Precautions / Restrictions Precautions Precautions: Fall Precaution Comments: multiple B rib fractures, Lt AKA, on HFNC 15 L     Mobility  Bed Mobility Overal bed mobility: Needs Assistance Bed Mobility: Supine to Sit, Rolling Rolling: Supervision   Supine to sit: Mod assist     General bed mobility comments: rolled to left side, placed right leg  over bed edge , patient struggled to push self up to sitting, mod assist to to get to upright position.    Transfers Overall transfer level: Needs assistance Equipment used: Rolling walker (2  wheels) Transfers: Sit to/from Stand, Bed to chair/wheelchair/BSC Sit to Stand: Mod assist, +2 safety/equipment   Step pivot transfers: Mod assist, +2 safety/equipment       General transfer comment: Patient stood  at [place],  steady assist. patient able to "hop" a few steps forward and turn to recliner, cues for position at [place] prior to sitting down, decreased control of descent.    Ambulation/Gait                   Stairs             Wheelchair Mobility    Modified Rankin (Stroke Patients Only)       Balance Overall balance assessment: History of Falls, Needs assistance Sitting-balance support: Feet supported Sitting balance-Leahy Scale: Fair     Standing balance support: Bilateral upper extremity supported, Reliant on assistive device for balance Standing balance-Leahy Scale: Poor                              Cognition Arousal/Alertness: Awake/alert Behavior During Therapy: Flat affect Overall Cognitive Status: No family/caregiver present to determine baseline cognitive functioning                                 General Comments: awake,  not wanting to get to recliner, questioning reason. explanation provided. ptient consented to trnasfer        Exercises      General Comments        Pertinent Vitals/Pain Pain Assessment Faces Pain Scale: No hurt    Home Living  Prior Function            PT Goals (current goals can now be found in the care plan section) Progress towards PT goals: Progressing toward goals    Frequency    Min 2X/week      PT Plan Current plan remains appropriate    Co-evaluation PT/OT/SLP Co-Evaluation/Treatment: Yes Reason for Co-Treatment: For patient/therapist safety PT goals addressed during session: Mobility/safety with mobility OT goals addressed during session: ADL's and self-care      AM-PAC PT "6 Clicks" Mobility   Outcome Measure   Help needed turning from your back to your side while in a flat bed without using bedrails?: A Lot Help needed moving from lying on your back to sitting on the side of a flat bed without using bedrails?: A Lot Help needed moving to and from a bed to a chair (including a wheelchair)?: A Lot Help needed standing up from a chair using your arms (e.g., wheelchair or bedside chair)?: A Lot Help needed to walk in hospital room?: Total Help needed climbing 3-5 steps with a railing? : Total 6 Click Score: 10    End of Session   Activity Tolerance: Patient tolerated treatment well Patient left: in chair;with call bell/phone within reach;with chair alarm set Nurse Communication: Mobility status PT Visit Diagnosis: Unsteadiness on feet (R26.81);Other abnormalities of gait and mobility (R26.89);Muscle weakness (generalized) (M62.81);Difficulty in walking, not elsewhere classified (R26.2)     Time: 5300-5110 PT Time Calculation (min) (ACUTE ONLY): 23 min  Charges:  $Therapeutic Activity: 8-22 mins                     Tresa Endo PT Acute Rehabilitation Services Pager [phone] Office [phone]    Claretha Cooper 02/22/2021, 8:59 AM

## 2021-02-23 DIAGNOSIS — J410 Simple chronic bronchitis: Secondary | ICD-10-CM

## 2021-02-23 LAB — CBC WITH DIFFERENTIAL/PLATELET
Abs Immature Granulocytes: 0.31 10*3/uL — ABNORMAL HIGH (ref 0.00–0.07)
Basophils Absolute: 0.1 10*3/uL (ref 0.0–0.1)
Basophils Relative: 0 %
Eosinophils Absolute: 0 10*3/uL (ref 0.0–0.5)
Eosinophils Relative: 0 %
HCT: 53.8 % — ABNORMAL HIGH (ref 39.0–52.0)
Hemoglobin: 17.5 g/dL — ABNORMAL HIGH (ref 13.0–17.0)
Immature Granulocytes: 2 %
Lymphocytes Relative: 10 %
Lymphs Abs: 2 10*3/uL (ref 0.7–4.0)
MCH: 29.3 pg (ref 26.0–34.0)
MCHC: 32.5 g/dL (ref 30.0–36.0)
MCV: 90.1 fL (ref 80.0–100.0)
Monocytes Absolute: 1.7 10*3/uL — ABNORMAL HIGH (ref 0.1–1.0)
Monocytes Relative: 8 %
Neutro Abs: 16.6 10*3/uL — ABNORMAL HIGH (ref 1.7–7.7)
Neutrophils Relative %: 80 %
Platelets: 238 10*3/uL (ref 150–400)
RBC: 5.97 MIL/uL — ABNORMAL HIGH (ref 4.22–5.81)
RDW: 12.3 % (ref 11.5–15.5)
WBC: 20.6 10*3/uL — ABNORMAL HIGH (ref 4.0–10.5)
nRBC: 0 % (ref 0.0–0.2)

## 2021-02-23 LAB — GLUCOSE, CAPILLARY
Glucose-Capillary: 166 mg/dL — ABNORMAL HIGH (ref 70–99)
Glucose-Capillary: 235 mg/dL — ABNORMAL HIGH (ref 70–99)
Glucose-Capillary: 343 mg/dL — ABNORMAL HIGH (ref 70–99)
Glucose-Capillary: 94 mg/dL (ref 70–99)

## 2021-02-23 LAB — BASIC METABOLIC PANEL
Anion gap: 8 (ref 5–15)
BUN: 27 mg/dL — ABNORMAL HIGH (ref 8–23)
CO2: 29 mmol/L (ref 22–32)
Calcium: 8.6 mg/dL — ABNORMAL LOW (ref 8.9–10.3)
Chloride: 99 mmol/L (ref 98–111)
Creatinine, Ser: 0.61 mg/dL (ref 0.61–1.24)
GFR, Estimated: >60 mL/min (ref >60)
Glucose, Bld: 145 mg/dL — ABNORMAL HIGH (ref 70–99)
Potassium: 3.5 mmol/L (ref 3.5–5.1)
Sodium: 136 mmol/L (ref 135–145)

## 2021-02-23 LAB — MAGNESIUM: Magnesium: 2.3 mg/dL (ref 1.7–2.4)

## 2021-02-23 MED ORDER — POTASSIUM CHLORIDE CRYS ER 20 MEQ PO TBCR
40.0000 meq | EXTENDED_RELEASE_TABLET | Freq: Once | ORAL | Status: AC
Start: 2021-02-23 — End: 2021-02-23
  Administered 2021-02-23: 40 meq via ORAL
  Filled 2021-02-23: qty 2

## 2021-02-23 NOTE — Progress Notes (Signed)
Inpatient Diabetes Program Recommendations  AACE/ADA: New Consensus Statement on Inpatient Glycemic Control (2015)  Target Ranges:  Prepandial:   less than 140 mg/dL      Peak postprandial:   less than 180 mg/dL (1-2 hours)      Critically ill patients:  140 - 180 mg/dL   Lab Results  Component Value Date   GLUCAP 166 (H) 02/23/2021   HGBA1C 7.7 (H) 02/12/2021    Review of Glycemic Control  Latest Reference Range & Units 02/22/21 16:17 02/22/21 22:32 02/23/21 08:10  Glucose-Capillary 70 - 99 mg/dL 189 (H) 318 (H) 166 (H)  (H): Data is abnormally high Home DM Meds: Glipizide 10 mg BID (NOT taking)      Metformin 750 mg BID      Semaglutide 7 mg Daily     Current Orders: Semglee 26 units QHS                            Novolog 10 units TID with meals                            Novolog Resistant Correction Scale/ SSI (0-20 units) TID AC + HS Prednisone 40 mg QD  Inpatient Diabetes Program Recommendations:    Consider increasing Novolog to 14 units TID (assuming patient consuming >50% of meals).   Thanks, Bronson Curb, MSN, RNC-OB Diabetes Coordinator [phone] (8a-5p)

## 2021-02-23 NOTE — TOC Progression Note (Signed)
Transition of Care (TOC) - Progression Note    Patient Details  Name: Markail Diekman. MRN: [id number] Date of Birth: [birth year]  Transition of Care [facility]) CM/SW Contact  Purcell Mouton, RN Phone Number: 02/23/2021, 1:45 PM  Clinical Narrative:     Appeal for [facility] was fax to [phone] [employer], placed in pt's chart.   Expected Discharge Plan: [place] Barriers to Discharge: Continued Medical Work up  Expected Discharge Plan and Services Expected Discharge Plan: [place] In-house Referral: Clinical Social Work   Post Acute Care Choice: [place] Living arrangements for the past 2 months: [place]                 DME Arranged: N/A DME Agency: NA                   Social Determinants of Health (SDOH) Interventions    Readmission Risk Interventions Readmission Risk Prevention Plan 02/13/2021  Transportation Screening Complete  PCP or Specialist Appt within 5-7 Days Complete  Home Care Screening Complete  Medication Review (RN CM) Complete  Some recent data might be hidden

## 2021-02-23 NOTE — Progress Notes (Signed)
Progress Note   Patient: Anthony [name]. GYJ:[id number] DOB: 06-09-1950 DOA: 02/12/2021     11 DOS: the patient was seen and examined on 02/23/2021   Brief hospital course: [name] is a 71 yo male with PMH CAD, PAD, HLD, COPD, bipolar, DM II who presented with worsening dyspnea at home.  He resides at a skilled nursing facility and was recently diagnosed with [place] outpatient around approximately 02/03/2021.  Due to becoming hypoxic, he was sent to the ER for further evaluation. He also underwent left AKA on 09/21/2020 and has had recurrent falls since. He was positive for COVID-19 on evaluation in the ER as well as significantly hypoxic requiring nonrebreather.  He was started on remdesivir, steroids, and breathing treatments. He was also evaluated for trauma from his falls and found to have multiple rib fractures bilaterally.  Difficult to wean O2, pulmonology following.  Assessment and Plan: * Acute respiratory failure with hypoxia ([place])- (present on admission) - Chronic underlying lung disease, states he is on O2 at home. CTA chest shows bilateral paraseptal emphysema with chronic ILD.  Also has multiple pulmonary nodules.  No PE. - pleural effusion not large enough to tap -Etiology for worsening hypoxia felt due to exacerbation of underlying COPD as well as superimposed COVID infection. Presumption is that he should improve slowly but steadily to return close to home settings - Difficult to wean O2, still on 15 L HFNC. May need LTACH for further slow weaning due to anticipated prolonged hospitalization  -On eliquis - echo on 2/14 shows EF 60-65%, normal diastology  Paroxysmal atrial fibrillation with RVR ([place])- (present on admission) - Patient noted to be in A-fib with RVR with heart rate in 140s, started on beta-blocker 25 mg p.o. twice daily -Currently normal sinus rhythm, on Eliquis - cardiology following as well. Echo on 2/14 shows normal EF, no diastolic dysfunction  [id number]  virus infection - Reportedly diagnosed approximately 1 week prior to admission but unclear if he has been treated - Given worsening oxygen demands and several comorbidities, completed remdesivir on 2/9  -On p.o. prednisone, Pulmicort, intermittent Lasix  COPD (chronic obstructive pulmonary disease) ([place])- (present on admission) -Mild exacerbation in the setting of ongoing COVID infection  --Chest x-ray showed bibasilar left greater than right areas of atelectasis or consolidation.  Procalcitonin <0.1 -Pulmonology following, BiPAP nightly, s/p trial of albumin/Lasix  History of DVT (deep vein thrombosis) Patient was on Xarelto prior to admission. -Venous Doppler showed a new DVT in the right lower extremity from proximal femoral to the popliteal and in single posterior tibial vein.   Also has bilateral arterial disease with occlusion of the proximal-distal LT SFA and a focal dilitation of the proximal RT SFA to 2.5 cm -Patient has known history of PAD.  Placed on therapeutic eliquis.    Hypokalemia - Replete PRN  Rib fractures - Likely due to recurrent falls after his left AKA - CTA chest shows left 3rd - 5th and left 5th and 9th rib fractures -Continue incentive spirometry   HTN (hypertension) - BP stable  Bipolar 1 disorder (HCC) - continue mirtazapine,  -Flat affect, pulmonology starting Lexapro and as needed Atarax  Multiple lung nodules- (present on admission) - Appears to be known from 2020 - CTA chest shows 6 mm left lower lobe, 3 and 5 mm right middle lobe - appears stable/smaller from prior imagining, continue outpatient monitoring -Will need outpatient pulmonology follow-up   DM2 (diabetes mellitus, type 2) (HCC) - A1c 7.7% on admission -  CBGs elevated due to steroids, improving since transition to oral prednisone. -Continue Semglee and SSI   Hyperlipidemia associated with type 2 diabetes mellitus ([place])- (present on admission) - Continue Lipitor  CAD (coronary  artery disease)- (present on admission) -Currently on aspirin, beta-blocker, statin, eliquis      Subjective: Briefly required NRB on top of salter HF yesterday. This am remains on 15L [place] HFNC.  Also had P2P with LTACH on 2/16; patient was denied. Appeal request being sent today.   Physical Exam: Vitals:   02/23/21 0900 02/23/21 0912 02/23/21 0942 02/23/21 1100  BP:      Pulse: 69     Resp: 20     Temp:    98.1 F (36.7 C)  TempSrc:    Axillary  SpO2: (!) 87% (!) 88% 90%   Weight:      Height:      Physical Exam Constitutional:      General: He is not in acute distress.    Comments: Fatigued but no distress  HENT:     Head: Normocephalic and atraumatic.     Mouth/Throat:     Mouth: Mucous membranes are moist.  Eyes:     Extraocular Movements: Extraocular movements intact.  Cardiovascular:     Rate and Rhythm: Normal rate and regular rhythm.     Heart sounds: Normal heart sounds.  Pulmonary:     Effort: Pulmonary effort is normal. No respiratory distress.     Comments: Bilateral coarse breath sounds Abdominal:     General: Bowel sounds are normal. There is no distension.     Palpations: Abdomen is soft.     Tenderness: There is no abdominal tenderness.  Musculoskeletal:        General: Normal range of motion.     Cervical back: Normal range of motion and neck supple.  Skin:    General: Skin is warm and dry.  Neurological:     General: No focal deficit present.  Psychiatric:        Mood and Affect: Mood normal.     Data Reviewed:  I have Reviewed nursing notes, Vitals, and Lab results since pt's last encounter. Pertinent lab results : see below I have ordered test including BMP, CBC, Mg I have reviewed the last note from staff over past 24 hours,  I have discussed pt's care plan and test results with nursing staff, CM. Results for orders placed or performed during the hospital encounter of 02/12/21 (from the past 24 hour(s))  Glucose, capillary      Status: Abnormal   Collection Time: 02/22/21  4:17 PM  Result Value Ref Range   Glucose-Capillary 189 (H) 70 - 99 mg/dL   Comment 1 Notify RN    Comment 2 Document in Chart   Glucose, capillary     Status: Abnormal   Collection Time: 02/22/21 10:32 PM  Result Value Ref Range   Glucose-Capillary 318 (H) 70 - 99 mg/dL   Comment 1 Notify RN    Comment 2 Document in Chart   Magnesium     Status: None   Collection Time: 02/23/21  3:01 AM  Result Value Ref Range   Magnesium 2.3 1.7 - 2.4 mg/dL  Basic metabolic panel     Status: Abnormal   Collection Time: 02/23/21  3:01 AM  Result Value Ref Range   Sodium 136 135 - 145 mmol/L   Potassium 3.5 3.5 - 5.1 mmol/L   Chloride 99 98 - 111 mmol/L   CO2  29 22 - 32 mmol/L   Glucose, Bld 145 (H) 70 - 99 mg/dL   BUN 27 (H) 8 - 23 mg/dL   Creatinine, Ser 0.61 0.61 - 1.24 mg/dL   Calcium 8.6 (L) 8.9 - 10.3 mg/dL   GFR, Estimated >60 >60 mL/min   Anion gap 8 5 - 15  CBC with Differential/Platelet     Status: Abnormal   Collection Time: 02/23/21  3:01 AM  Result Value Ref Range   WBC 20.6 (H) 4.0 - 10.5 K/uL   RBC 5.97 (H) 4.22 - 5.81 MIL/uL   Hemoglobin 17.5 (H) 13.0 - 17.0 g/dL   HCT 53.8 (H) 39.0 - 52.0 %   MCV 90.1 80.0 - 100.0 fL   MCH 29.3 26.0 - 34.0 pg   MCHC 32.5 30.0 - 36.0 g/dL   RDW 12.3 11.5 - 15.5 %   Platelets 238 150 - 400 K/uL   nRBC 0.0 0.0 - 0.2 %   Neutrophils Relative % 80 %   Neutro Abs 16.6 (H) 1.7 - 7.7 K/uL   Lymphocytes Relative 10 %   Lymphs Abs 2.0 0.7 - 4.0 K/uL   Monocytes Relative 8 %   Monocytes Absolute 1.7 (H) 0.1 - 1.0 K/uL   Eosinophils Relative 0 %   Eosinophils Absolute 0.0 0.0 - 0.5 K/uL   Basophils Relative 0 %   Basophils Absolute 0.1 0.0 - 0.1 K/uL   Immature Granulocytes 2 %   Abs Immature Granulocytes 0.31 (H) 0.00 - 0.07 K/uL  Glucose, capillary     Status: Abnormal   Collection Time: 02/23/21  8:10 AM  Result Value Ref Range   Glucose-Capillary 166 (H) 70 - 99 mg/dL   Comment 1 Notify  RN    Comment 2 Document in Chart   Glucose, capillary     Status: Abnormal   Collection Time: 02/23/21 11:38 AM  Result Value Ref Range   Glucose-Capillary 235 (H) 70 - 99 mg/dL   Comment 1 Notify RN    Comment 2 Document in Chart      Family Communication:   Disposition: Status is: Inpatient Remains inpatient appropriate because: Treatment as outlined in A&P    Planned Discharge Destination: LTAC  DVT PPX: apixaban [place])    Author: Dwyane Dee, MD 02/23/2021 2:09 PM  For on call review [email or web address].

## 2021-02-24 LAB — BASIC METABOLIC PANEL
Anion gap: 7 (ref 5–15)
BUN: 27 mg/dL — ABNORMAL HIGH (ref 8–23)
CO2: 30 mmol/L (ref 22–32)
Calcium: 8.8 mg/dL — ABNORMAL LOW (ref 8.9–10.3)
Chloride: 100 mmol/L (ref 98–111)
Creatinine, Ser: 0.67 mg/dL (ref 0.61–1.24)
GFR, Estimated: >60 mL/min (ref >60)
Glucose, Bld: 140 mg/dL — ABNORMAL HIGH (ref 70–99)
Potassium: 4 mmol/L (ref 3.5–5.1)
Sodium: 137 mmol/L (ref 135–145)

## 2021-02-24 LAB — CBC WITH DIFFERENTIAL/PLATELET
Abs Immature Granulocytes: 0.26 10*3/uL — ABNORMAL HIGH (ref 0.00–0.07)
Basophils Absolute: 0.1 10*3/uL (ref 0.0–0.1)
Basophils Relative: 0 %
Eosinophils Absolute: 0 10*3/uL (ref 0.0–0.5)
Eosinophils Relative: 0 %
HCT: 52.2 % — ABNORMAL HIGH (ref 39.0–52.0)
Hemoglobin: 16.9 g/dL (ref 13.0–17.0)
Immature Granulocytes: 1 %
Lymphocytes Relative: 9 %
Lymphs Abs: 1.8 10*3/uL (ref 0.7–4.0)
MCH: 29.3 pg (ref 26.0–34.0)
MCHC: 32.4 g/dL (ref 30.0–36.0)
MCV: 90.6 fL (ref 80.0–100.0)
Monocytes Absolute: 1.6 10*3/uL — ABNORMAL HIGH (ref 0.1–1.0)
Monocytes Relative: 8 %
Neutro Abs: 16.3 10*3/uL — ABNORMAL HIGH (ref 1.7–7.7)
Neutrophils Relative %: 82 %
Platelets: 232 10*3/uL (ref 150–400)
RBC: 5.76 MIL/uL (ref 4.22–5.81)
RDW: 12.3 % (ref 11.5–15.5)
WBC: 20 10*3/uL — ABNORMAL HIGH (ref 4.0–10.5)
nRBC: 0 % (ref 0.0–0.2)

## 2021-02-24 LAB — MAGNESIUM: Magnesium: 2.4 mg/dL (ref 1.7–2.4)

## 2021-02-24 LAB — GLUCOSE, CAPILLARY
Glucose-Capillary: 195 mg/dL — ABNORMAL HIGH (ref 70–99)
Glucose-Capillary: 191 mg/dL — ABNORMAL HIGH (ref 70–99)
Glucose-Capillary: 199 mg/dL — ABNORMAL HIGH (ref 70–99)
Glucose-Capillary: 216 mg/dL — ABNORMAL HIGH (ref 70–99)

## 2021-02-24 MED ORDER — AYR SALINE NASAL NA GEL
1.0000 "application " | NASAL | Status: DC | PRN
  Administered 2021-02-24: 1 via NASAL
  Filled 2021-02-24: qty 14.1

## 2021-02-24 MED ORDER — SALINE SPRAY 0.65 % NA SOLN
1.0000 | NASAL | Status: DC | PRN
  Administered 2021-02-24: 1 via NASAL
  Filled 2021-02-24: qty 44

## 2021-02-24 NOTE — Progress Notes (Signed)
Progress Note   Patient: Anthony [name]. CHE:[id number] DOB: 02/19/1950 DOA: 02/12/2021     12 DOS: the patient was seen and examined on 02/24/2021   Brief hospital course: Anthony [name] is a 71 yo male with PMH CAD, PAD, HLD, COPD, bipolar, DM II who presented with worsening dyspnea at home.  He resides at a skilled nursing facility and was recently diagnosed with [place] outpatient around approximately 02/03/2021.  Due to becoming hypoxic, he was sent to the ER for further evaluation. He also underwent left AKA on 09/21/2020 and has had recurrent falls since. He was positive for COVID-19 on evaluation in the ER as well as significantly hypoxic requiring nonrebreather.  He was started on remdesivir, steroids, and breathing treatments. He was also evaluated for trauma from his falls and found to have multiple rib fractures bilaterally.  Difficult to wean O2, pulmonology following.  Assessment and Plan: * Acute respiratory failure with hypoxia ([place])- (present on admission) - Chronic underlying lung disease, states he is on O2 at home. CTA chest shows bilateral paraseptal emphysema with chronic ILD.  Also has multiple pulmonary nodules.  No PE. - pleural effusion not large enough to tap -Etiology for worsening hypoxia felt due to exacerbation of underlying COPD as well as superimposed COVID infection. Presumption is that he should improve slowly but steadily to return close to home settings - Difficult to wean O2, still on 15 L HFNC. May need LTACH for further slow weaning due to anticipated prolonged hospitalization  -On eliquis - echo on 2/14 shows EF 60-65%, normal diastology  Paroxysmal atrial fibrillation with RVR ([place])- (present on admission) - Patient noted to be in A-fib with RVR with heart rate in 140s, started on beta-blocker 25 mg p.o. twice daily -Currently normal sinus rhythm, on Eliquis - cardiology following as well. Echo on 2/14 shows normal EF, no diastolic dysfunction  COPD  (chronic obstructive pulmonary disease) ([place])- (present on admission) -Mild exacerbation in the setting of ongoing COVID infection  --Chest x-ray showed bibasilar left greater than right areas of atelectasis or consolidation.  Procalcitonin <0.1 -Pulmonology following, BiPAP nightly, s/p trial of albumin/Lasix  COVID-19 virus infection-resolved as of 02/24/2021 - Reportedly diagnosed approximately 1 week prior to admission but unclear if he has been treated - Given worsening oxygen demands and several comorbidities, completed remdesivir on 2/9  -On p.o. prednisone, Pulmicort, intermittent Lasix - okay to d/c precautions; done on 2/17  History of DVT (deep vein thrombosis) Patient was on Xarelto prior to admission. -Venous Doppler showed a new DVT in the right lower extremity from proximal femoral to the popliteal and in single posterior tibial vein.   Also has bilateral arterial disease with occlusion of the proximal-distal LT SFA and a focal dilitation of the proximal RT SFA to 2.5 cm -Patient has known history of PAD.  Placed on therapeutic eliquis.    Hypokalemia - Replete PRN  Rib fractures - Likely due to recurrent falls after his left AKA - CTA chest shows left 3rd - 5th and left 5th and 9th rib fractures -Continue incentive spirometry   HTN (hypertension) - BP stable  Bipolar 1 disorder (HCC) - continue mirtazapine,  -Flat affect, pulmonology starting Lexapro and as needed Atarax  Multiple lung nodules- (present on admission) - Appears to be known from 2020 - CTA chest shows 6 mm left lower lobe, 3 and 5 mm right middle lobe - appears stable/smaller from prior imagining, continue outpatient monitoring -Will need outpatient pulmonology follow-up  DM2 (diabetes mellitus, type 2) (HCC) - A1c 7.7% on admission - CBGs elevated due to steroids, improving since transition to oral prednisone. -Continue Semglee and SSI   Hyperlipidemia associated with type 2 diabetes  mellitus ([place])- (present on admission) - Continue Lipitor  CAD (coronary artery disease)- (present on admission) -Currently on aspirin, beta-blocker, statin, eliquis      Subjective: No events overnight.  Resting in bed.  Breathing is comfortable.  Physical Exam: Vitals:   02/24/21 1200 02/24/21 1300 02/24/21 1400 02/24/21 1448  BP: 122/78  101/69   Pulse: 84  (!) 59   Resp: (!) 26  19   Temp:  98.7 F (37.1 C)    TempSrc:  Oral    SpO2: 92%  93% 96%  Weight:      Height:      Physical Exam Constitutional:      General: He is not in acute distress.    Comments: Fatigued but no distress  HENT:     Head: Normocephalic and atraumatic.     Mouth/Throat:     Mouth: Mucous membranes are moist.  Eyes:     Extraocular Movements: Extraocular movements intact.  Cardiovascular:     Rate and Rhythm: Normal rate and regular rhythm.     Heart sounds: Normal heart sounds.  Pulmonary:     Effort: Pulmonary effort is normal. No respiratory distress.     Comments: Bilateral coarse breath sounds Abdominal:     General: Bowel sounds are normal. There is no distension.     Palpations: Abdomen is soft.     Tenderness: There is no abdominal tenderness.  Musculoskeletal:        General: Normal range of motion.     Cervical back: Normal range of motion and neck supple.  Skin:    General: Skin is warm and dry.  Neurological:     General: No focal deficit present.  Psychiatric:        Mood and Affect: Mood normal.     Data Reviewed:  I have Reviewed nursing notes, Vitals, and Lab results since pt's last encounter. Pertinent lab results : see below I have ordered test including BMP, CBC, Mg I have reviewed the last note from staff over past 24 hours,  I have discussed pt's care plan and test results with nursing staff, CM. Results for orders placed or performed during the hospital encounter of 02/12/21 (from the past 24 hour(s))  Glucose, capillary     Status: Abnormal    Collection Time: 02/23/21  4:51 PM  Result Value Ref Range   Glucose-Capillary 343 (H) 70 - 99 mg/dL   Comment 1 Notify RN    Comment 2 Document in Chart   Glucose, capillary     Status: None   Collection Time: 02/23/21  9:34 PM  Result Value Ref Range   Glucose-Capillary 94 70 - 99 mg/dL  Magnesium     Status: None   Collection Time: 02/24/21  2:57 AM  Result Value Ref Range   Magnesium 2.4 1.7 - 2.4 mg/dL  Basic metabolic panel     Status: Abnormal   Collection Time: 02/24/21  2:57 AM  Result Value Ref Range   Sodium 137 135 - 145 mmol/L   Potassium 4.0 3.5 - 5.1 mmol/L   Chloride 100 98 - 111 mmol/L   CO2 30 22 - 32 mmol/L   Glucose, Bld 140 (H) 70 - 99 mg/dL   BUN 27 (H) 8 - 23 mg/dL  Creatinine, Ser 0.67 0.61 - 1.24 mg/dL   Calcium 8.8 (L) 8.9 - 10.3 mg/dL   GFR, Estimated >60 >60 mL/min   Anion gap 7 5 - 15  CBC with Differential/Platelet     Status: Abnormal   Collection Time: 02/24/21  2:57 AM  Result Value Ref Range   WBC 20.0 (H) 4.0 - 10.5 K/uL   RBC 5.76 4.22 - 5.81 MIL/uL   Hemoglobin 16.9 13.0 - 17.0 g/dL   HCT 52.2 (H) 39.0 - 52.0 %   MCV 90.6 80.0 - 100.0 fL   MCH 29.3 26.0 - 34.0 pg   MCHC 32.4 30.0 - 36.0 g/dL   RDW 12.3 11.5 - 15.5 %   Platelets 232 150 - 400 K/uL   nRBC 0.0 0.0 - 0.2 %   Neutrophils Relative % 82 %   Neutro Abs 16.3 (H) 1.7 - 7.7 K/uL   Lymphocytes Relative 9 %   Lymphs Abs 1.8 0.7 - 4.0 K/uL   Monocytes Relative 8 %   Monocytes Absolute 1.6 (H) 0.1 - 1.0 K/uL   Eosinophils Relative 0 %   Eosinophils Absolute 0.0 0.0 - 0.5 K/uL   Basophils Relative 0 %   Basophils Absolute 0.1 0.0 - 0.1 K/uL   Immature Granulocytes 1 %   Abs Immature Granulocytes 0.26 (H) 0.00 - 0.07 K/uL  Glucose, capillary     Status: Abnormal   Collection Time: 02/24/21  8:03 AM  Result Value Ref Range   Glucose-Capillary 195 (H) 70 - 99 mg/dL  Glucose, capillary     Status: Abnormal   Collection Time: 02/24/21 12:39 PM  Result Value Ref Range    Glucose-Capillary 191 (H) 70 - 99 mg/dL   Comment 1 Notify RN    Comment 2 Document in Chart      Family Communication:   Disposition: Status is: Inpatient Remains inpatient appropriate because: Treatment as outlined in A&P    Planned Discharge Destination: LTAC possibly   DVT PPX: apixaban [place])    Author: Dwyane Dee, MD 02/24/2021 3:37 PM  For on call review [email or web address].

## 2021-02-24 NOTE — Progress Notes (Signed)
Progress Note  Patient Name: Anthony [name]. Date of Encounter: 02/24/2021  Primary Cardiologist:   Lauree Chandler, MD   Subjective   Seems somewhat confused.  Currently coughing and has a nose bleed.  He still has SOB and it is difficult to assess whether he feels like he is less dyspneic.    Inpatient Medications    Scheduled Meds:  apixaban  10 mg Oral BID   Followed by   Derrill Memo ON 02/25/2021] apixaban  5 mg Oral BID   vitamin C  500 mg Oral Daily   aspirin EC  81 mg Oral Daily   atorvastatin  40 mg Oral Daily   benzonatate  200 mg Oral TID   budesonide (PULMICORT) nebulizer solution  0.5 mg Nebulization BID   Chlorhexidine Gluconate Cloth  6 each Topical Q0600   diltiazem  120 mg Oral Daily   escitalopram  10 mg Oral QHS   feeding supplement (GLUCERNA SHAKE)  237 mL Oral BID BM   gabapentin  100 mg Oral BID   guaiFENesin  1,200 mg Oral BID   insulin aspart  0-20 Units Subcutaneous TID WC   insulin aspart  0-5 Units Subcutaneous QHS   insulin aspart  10 Units Subcutaneous TID WC   insulin glargine-yfgn  26 Units Subcutaneous QHS   ipratropium-albuterol  3 mL Nebulization TID   mouth rinse  15 mL Mouth Rinse BID   metoprolol tartrate  25 mg Oral BID   mirtazapine  30 mg Oral QHS   multivitamin with minerals  1 tablet Oral Daily   Ensure Max Protein  11 oz Oral Daily   zinc sulfate  220 mg Oral Daily   Continuous Infusions:  sodium chloride Stopped (02/17/21 0854)   PRN Meds: sodium chloride, acetaminophen **OR** acetaminophen, chlorpheniramine-HYDROcodone, hydrOXYzine, sodium chloride   Vital Signs    Vitals:   02/24/21 0821 02/24/21 0834 02/24/21 1000 02/24/21 1051  BP:   110/79 110/80  Pulse:  (!) 113 (!) 104 99  Resp:  (!) 21 20 (!) 23  Temp: 99 F (37.2 C)     TempSrc: Axillary     SpO2:  (!) 86% (!) 87% 91%  Weight:      Height:        Intake/Output Summary (Last 24 hours) at 02/24/2021 1120 Last data filed at 02/24/2021 1027 Gross per  24 hour  Intake 1200 ml  Output 400 ml  Net 800 ml   Filed Weights   02/12/21 1823  Weight: 87.2 kg    Telemetry    Atrial fib, SB - Personally Reviewed  ECG    NA - Personally Reviewed  Physical Exam   GEN: No acute distress.   Neck: No  JVD Cardiac: RRR, no murmurs, rubs, or gallops.  Respiratory:     Decreased breath sound throughout.  Coarse crackles GI: Soft, nontender, non-distended  MS: No  edema; No deformity. Neuro:  Nonfocal  Psych: Normal affect but seems somewhat confused.   Labs    Chemistry Recent Labs  Lab 02/22/21 0329 02/23/21 0301 02/24/21 0257  NA 138 136 137  K 3.9 3.5 4.0  CL 100 99 100  CO2 31 29 30   GLUCOSE 115* 145* 140*  BUN 39* 27* 27*  CREATININE 0.64 0.61 0.67  CALCIUM 8.6* 8.6* 8.8*  GFRNONAA >60 >60 >60  ANIONGAP 7 8 7      Hematology Recent Labs  Lab 02/20/21 0305 02/23/21 0301 02/24/21 0257  WBC 15.0* 20.6* 20.0*  RBC 5.80 5.97* 5.76  HGB 17.3* 17.5* 16.9  HCT 51.4 53.8* 52.2*  MCV 88.6 90.1 90.6  MCH 29.8 29.3 29.3  MCHC 33.7 32.5 32.4  RDW 12.5 12.3 12.3  PLT 290 238 232    Cardiac EnzymesNo results for input(s): TROPONINI in the last 168 hours. No results for input(s): TROPIPOC in the last 168 hours.   BNPNo results for input(s): BNP, PROBNP in the last 168 hours.   DDimer No results for input(s): DDIMER in the last 168 hours.   Radiology    No results found.  Cardiac Studies   Echo:     1. Non-diagnostic bubble study despite 3 attempts. Would recommend TCDs  or TEE if clinically indicated.   2. Left ventricular ejection fraction, by estimation, is 60 to 65%. The  left ventricle has normal function. The left ventricle has no regional  wall motion abnormalities. Left ventricular diastolic parameters were  normal.   3. Right ventricular systolic function is normal. The right ventricular  size is normal. Tricuspid regurgitation signal is inadequate for assessing  PA pressure.   4. The mitral valve  is grossly normal. No evidence of mitral valve  regurgitation. No evidence of mitral stenosis.   5. The aortic valve is tricuspid. There is mild calcification of the  aortic valve. Aortic valve regurgitation is not visualized. No aortic  stenosis is present.   6. The inferior vena cava is normal in size with greater than 50%  respiratory variability, suggesting right atrial pressure of 3 mmHg.   Patient Profile     71 y.o. male with a hx of CAD (inferior STEMI 2012 s/p BMS to RCA with residual moderate nonobstructive disease in LAD), recurrent DVT (2008, 2014, tx with Xarelto), COPD, tobacco abuse with hx of chain smoking, HTN, HLD, bipolar disorder, depression, anxiety, prior polysubstance abuse (ETOH, heroin, cocaine, THC), pulmonary nodules, arthritis, PAD s/p L AKA complicated by PAF/flutter 09/2020 who is being seen 02/21/2021 for the evaluation of hypoxia and atrial fibrillation at the request of Dr. Tana Coast.   Assessment & Plan    Acute hypoxic respiratory failure:  Plan per primary team.  I don't think that this is driven by volume overload.  He is 15 liters negative this admission.    Atrial fib:  BP is soft and when he is in sinus he is bradycardic.  I would not suggest increased Cardizem at this point.  We had initially thought of discontinuing the metoprolol with his lung disease but I think the combination is reasonable.  I would not suggest rhythm control therapy is needed at this point.     For questions or updates, please contact [place] Please consult [email or web address] for contact info under Cardiology/STEMI.   Signed, Minus Breeding, MD  02/24/2021, 11:20 AM

## 2021-02-25 LAB — CBC WITH DIFFERENTIAL/PLATELET
Abs Immature Granulocytes: 0.33 10*3/uL — ABNORMAL HIGH (ref 0.00–0.07)
Basophils Absolute: 0.1 10*3/uL (ref 0.0–0.1)
Basophils Relative: 0 %
Eosinophils Absolute: 0 10*3/uL (ref 0.0–0.5)
Eosinophils Relative: 0 %
HCT: 51 % (ref 39.0–52.0)
Hemoglobin: 16.2 g/dL (ref 13.0–17.0)
Immature Granulocytes: 2 %
Lymphocytes Relative: 12 %
Lymphs Abs: 2 10*3/uL (ref 0.7–4.0)
MCH: 29.1 pg (ref 26.0–34.0)
MCHC: 31.8 g/dL (ref 30.0–36.0)
MCV: 91.6 fL (ref 80.0–100.0)
Monocytes Absolute: 1.3 10*3/uL — ABNORMAL HIGH (ref 0.1–1.0)
Monocytes Relative: 8 %
Neutro Abs: 12.4 10*3/uL — ABNORMAL HIGH (ref 1.7–7.7)
Neutrophils Relative %: 78 %
Platelets: 219 10*3/uL (ref 150–400)
RBC: 5.57 MIL/uL (ref 4.22–5.81)
RDW: 12.3 % (ref 11.5–15.5)
WBC: 16 10*3/uL — ABNORMAL HIGH (ref 4.0–10.5)
nRBC: 0 % (ref 0.0–0.2)

## 2021-02-25 LAB — GLUCOSE, CAPILLARY
Glucose-Capillary: 96 mg/dL (ref 70–99)
Glucose-Capillary: 145 mg/dL — ABNORMAL HIGH (ref 70–99)
Glucose-Capillary: 94 mg/dL (ref 70–99)
Glucose-Capillary: 129 mg/dL — ABNORMAL HIGH (ref 70–99)

## 2021-02-25 LAB — BASIC METABOLIC PANEL
Anion gap: 8 (ref 5–15)
BUN: 27 mg/dL — ABNORMAL HIGH (ref 8–23)
CO2: 30 mmol/L (ref 22–32)
Calcium: 8.7 mg/dL — ABNORMAL LOW (ref 8.9–10.3)
Chloride: 102 mmol/L (ref 98–111)
Creatinine, Ser: 0.78 mg/dL (ref 0.61–1.24)
GFR, Estimated: >60 mL/min (ref >60)
Glucose, Bld: 79 mg/dL (ref 70–99)
Potassium: 3.8 mmol/L (ref 3.5–5.1)
Sodium: 140 mmol/L (ref 135–145)

## 2021-02-25 LAB — MAGNESIUM: Magnesium: 2.4 mg/dL (ref 1.7–2.4)

## 2021-02-25 NOTE — Progress Notes (Signed)
Progress Note   Patient: Anthony [name]. ZOX:[id number] DOB: [birth year] DOA: 02/12/2021     13 DOS: the patient was seen and examined on 02/25/2021   Brief hospital course: Mr. Fellman is a 71 yo male with PMH CAD, PAD, HLD, COPD, bipolar, DM II who presented with worsening dyspnea at home.  He resides at a skilled nursing facility and was recently diagnosed with [place] outpatient around approximately 02/03/2021.  Due to becoming hypoxic, he was sent to the ER for further evaluation. He also underwent left AKA on 09/21/2020 and has had recurrent falls since. He was positive for COVID-19 on evaluation in the ER as well as significantly hypoxic requiring nonrebreather.  He was started on remdesivir, steroids, and breathing treatments. He was also evaluated for trauma from his falls and found to have multiple rib fractures bilaterally.  Difficult to wean O2, pulmonology following.  Assessment and Plan: * Acute respiratory failure with hypoxia ([place])- (present on admission) - Chronic underlying lung disease, states he is on O2 at home. CTA chest shows bilateral paraseptal emphysema with chronic ILD.  Also has multiple pulmonary nodules.  No PE. - pleural effusion not large enough to tap -Etiology for worsening hypoxia felt due to exacerbation of underlying COPD as well as superimposed COVID infection. Presumption is that he should improve slowly but steadily to return close to home settings - Difficult to wean O2, still on 15 L HFNC. May need LTACH for further slow weaning due to anticipated prolonged hospitalization  -On eliquis - echo on 2/14 shows EF 60-65%, normal diastology  Paroxysmal atrial fibrillation with RVR ([place])- (present on admission) - Patient noted to be in A-fib with RVR with heart rate in 140s, started on beta-blocker 25 mg p.o. twice daily -Currently normal sinus rhythm, on Eliquis - cardiology following as well. Echo on 2/14 shows normal EF, no diastolic dysfunction  COPD  (chronic obstructive pulmonary disease) ([place])- (present on admission) -Mild exacerbation in the setting of ongoing COVID infection  --Chest x-ray showed bibasilar left greater than right areas of atelectasis or consolidation.  Procalcitonin <0.1 -Pulmonology following, BiPAP nightly, s/p trial of albumin/Lasix  COVID-19 virus infection-resolved as of 02/24/2021 - Reportedly diagnosed approximately 1 week prior to admission but unclear if he has been treated - Given worsening oxygen demands and several comorbidities, completed remdesivir on 2/9  -On p.o. prednisone, Pulmicort, intermittent Lasix - okay to d/c precautions; done on 2/17  History of DVT (deep vein thrombosis) Patient was on Xarelto prior to admission. -Venous Doppler showed a new DVT in the right lower extremity from proximal femoral to the popliteal and in single posterior tibial vein.   Also has bilateral arterial disease with occlusion of the proximal-distal LT SFA and a focal dilitation of the proximal RT SFA to 2.5 cm -Patient has known history of PAD.  Placed on therapeutic eliquis.    Hypokalemia - Replete PRN  Rib fractures - Likely due to recurrent falls after his left AKA - CTA chest shows left 3rd - 5th and left 5th and 9th rib fractures -Continue incentive spirometry   HTN (hypertension) - BP stable  Bipolar 1 disorder (HCC) - continue mirtazapine,  -Flat affect, pulmonology starting Lexapro and as needed Atarax  Multiple lung nodules- (present on admission) - Appears to be known from 2020 - CTA chest shows 6 mm left lower lobe, 3 and 5 mm right middle lobe - appears stable/smaller from prior imagining, continue outpatient monitoring -Will need outpatient pulmonology follow-up  DM2 (diabetes mellitus, type 2) (HCC) - A1c 7.7% on admission - CBGs elevated due to steroids, improving since transition to oral prednisone. -Continue Semglee and SSI   Hyperlipidemia associated with type 2 diabetes  mellitus ([place])- (present on admission) - Continue Lipitor  CAD (coronary artery disease)- (present on admission) -Currently on aspirin, beta-blocker, statin, eliquis     Subjective: No events overnight.  Resting in bed.  Breathing is comfortable. Heart rate remains controlled  Physical Exam: Vitals:   02/25/21 0911 02/25/21 1000 02/25/21 1100 02/25/21 1200  BP: 133/66 119/63  113/65  Pulse:  67 (!) 55 64  Resp:  (!) 24 (!) 22 17  Temp:      TempSrc:      SpO2:  (!) 86% 94% 91%  Weight:      Height:      Physical Exam Constitutional:      General: He is not in acute distress.    Comments: Fatigued but no distress  HENT:     Head: Normocephalic and atraumatic.     Mouth/Throat:     Mouth: Mucous membranes are moist.  Eyes:     Extraocular Movements: Extraocular movements intact.  Cardiovascular:     Rate and Rhythm: Normal rate and regular rhythm.     Heart sounds: Normal heart sounds.  Pulmonary:     Effort: Pulmonary effort is normal. No respiratory distress.     Comments: Bilateral coarse breath sounds Abdominal:     General: Bowel sounds are normal. There is no distension.     Palpations: Abdomen is soft.     Tenderness: There is no abdominal tenderness.  Musculoskeletal:        General: Normal range of motion.     Cervical back: Normal range of motion and neck supple.  Skin:    General: Skin is warm and dry.  Neurological:     General: No focal deficit present.  Psychiatric:        Mood and Affect: Mood normal.     Data Reviewed:  I have Reviewed nursing notes, Vitals, and Lab results since pt's last encounter. Pertinent lab results : see below I have ordered test including BMP, CBC, Mg I have reviewed the last note from staff over past 24 hours,  I have discussed pt's care plan and test results with nursing staff, CM. Results for orders placed or performed during the hospital encounter of 02/12/21 (from the past 24 hour(s))  Glucose, capillary      Status: Abnormal   Collection Time: 02/24/21  4:31 PM  Result Value Ref Range   Glucose-Capillary 199 (H) 70 - 99 mg/dL  Glucose, capillary     Status: Abnormal   Collection Time: 02/24/21  9:33 PM  Result Value Ref Range   Glucose-Capillary 216 (H) 70 - 99 mg/dL  CBC with Differential/Platelet     Status: Abnormal   Collection Time: 02/25/21  3:18 AM  Result Value Ref Range   WBC 16.0 (H) 4.0 - 10.5 K/uL   RBC 5.57 4.22 - 5.81 MIL/uL   Hemoglobin 16.2 13.0 - 17.0 g/dL   HCT 51.0 39.0 - 52.0 %   MCV 91.6 80.0 - 100.0 fL   MCH 29.1 26.0 - 34.0 pg   MCHC 31.8 30.0 - 36.0 g/dL   RDW 12.3 11.5 - 15.5 %   Platelets 219 150 - 400 K/uL   nRBC 0.0 0.0 - 0.2 %   Neutrophils Relative % 78 %   Neutro Abs 12.4 (  H) 1.7 - 7.7 K/uL   Lymphocytes Relative 12 %   Lymphs Abs 2.0 0.7 - 4.0 K/uL   Monocytes Relative 8 %   Monocytes Absolute 1.3 (H) 0.1 - 1.0 K/uL   Eosinophils Relative 0 %   Eosinophils Absolute 0.0 0.0 - 0.5 K/uL   Basophils Relative 0 %   Basophils Absolute 0.1 0.0 - 0.1 K/uL   Immature Granulocytes 2 %   Abs Immature Granulocytes 0.33 (H) 0.00 - 0.07 K/uL  Basic metabolic panel     Status: Abnormal   Collection Time: 02/25/21  3:18 AM  Result Value Ref Range   Sodium 140 135 - 145 mmol/L   Potassium 3.8 3.5 - 5.1 mmol/L   Chloride 102 98 - 111 mmol/L   CO2 30 22 - 32 mmol/L   Glucose, Bld 79 70 - 99 mg/dL   BUN 27 (H) 8 - 23 mg/dL   Creatinine, Ser 0.78 0.61 - 1.24 mg/dL   Calcium 8.7 (L) 8.9 - 10.3 mg/dL   GFR, Estimated >60 >60 mL/min   Anion gap 8 5 - 15  Magnesium     Status: None   Collection Time: 02/25/21  3:18 AM  Result Value Ref Range   Magnesium 2.4 1.7 - 2.4 mg/dL  Glucose, capillary     Status: None   Collection Time: 02/25/21  8:13 AM  Result Value Ref Range   Glucose-Capillary 96 70 - 99 mg/dL  Glucose, capillary     Status: Abnormal   Collection Time: 02/25/21 11:41 AM  Result Value Ref Range   Glucose-Capillary 145 (H) 70 - 99 mg/dL   Comment  1 Notify RN    Comment 2 Document in Chart      Family Communication:   Disposition: Status is: Inpatient Remains inpatient appropriate because: Treatment as outlined in A&P    Planned Discharge Destination: LTAC possibly   DVT PPX: apixaban [place])    Author: Dwyane Dee, MD 02/25/2021 12:58 PM  For on call review [email or web address].

## 2021-02-25 NOTE — Progress Notes (Signed)
Progress Note  Patient Name: Anthony [name]. Date of Encounter: 02/25/2021  Primary Cardiologist:   Lauree Chandler, MD   Subjective   Patient notes that he feel fine. Has lost track of how long he has been here. Wonders where he will leave to once he is discharge.  Inpatient Medications    Scheduled Meds:  apixaban  10 mg Oral BID   Followed by   apixaban  5 mg Oral BID   vitamin C  500 mg Oral Daily   aspirin EC  81 mg Oral Daily   atorvastatin  40 mg Oral Daily   benzonatate  200 mg Oral TID   budesonide (PULMICORT) nebulizer solution  0.5 mg Nebulization BID   Chlorhexidine Gluconate Cloth  6 each Topical Q0600   diltiazem  120 mg Oral Daily   escitalopram  10 mg Oral QHS   feeding supplement (GLUCERNA SHAKE)  237 mL Oral BID BM   gabapentin  100 mg Oral BID   guaiFENesin  1,200 mg Oral BID   insulin aspart  0-20 Units Subcutaneous TID WC   insulin aspart  0-5 Units Subcutaneous QHS   insulin aspart  10 Units Subcutaneous TID WC   insulin glargine-yfgn  26 Units Subcutaneous QHS   ipratropium-albuterol  3 mL Nebulization TID   mouth rinse  15 mL Mouth Rinse BID   metoprolol tartrate  25 mg Oral BID   mirtazapine  30 mg Oral QHS   multivitamin with minerals  1 tablet Oral Daily   Ensure Max Protein  11 oz Oral Daily   zinc sulfate  220 mg Oral Daily   Continuous Infusions:  sodium chloride Stopped (02/17/21 0854)   PRN Meds: sodium chloride, acetaminophen **OR** acetaminophen, chlorpheniramine-HYDROcodone, hydrOXYzine, saline, sodium chloride   Vital Signs    Vitals:   02/25/21 0300 02/25/21 0400 02/25/21 0500 02/25/21 0600  BP:  (!) 105/59  121/76  Pulse: (!) 54 (!) 50 (!) 53 (!) 54  Resp: 15 16 19 15   Temp: 97.6 F (36.4 C)     TempSrc: Oral     SpO2: 95% 93% 95% 93%  Weight:      Height:        Intake/Output Summary (Last 24 hours) at 02/25/2021 0806 Last data filed at 02/24/2021 1900 Gross per 24 hour  Intake 480 ml  Output 850 ml   Net -370 ml   Filed Weights   02/12/21 1823  Weight: 87.2 kg    Telemetry    SR to sinus brady with frequent PVCs - Personally Reviewed  Physical Exam   Gen: no distress, thin elderly male Neck: No JVD,  Cardiac: No Rubs or Gallops, regularly irregular bradycardia Respiratory: expiratory bilateral rhonchi without tachypnea GI: Soft, nontender, non-distended  MS: No  edema;  L stump site without drainage Integument: Skin feels warm Neuro:  At time of evaluation, alert and oriented to person/place/time/situation  Psych: Normal affect, patient feels ok   Labs    Chemistry Recent Labs  Lab 02/23/21 0301 02/24/21 0257 02/25/21 0318  NA 136 137 140  K 3.5 4.0 3.8  CL 99 100 102  CO2 29 30 30   GLUCOSE 145* 140* 79  BUN 27* 27* 27*  CREATININE 0.61 0.67 0.78  CALCIUM 8.6* 8.8* 8.7*  GFRNONAA >60 >60 >60  ANIONGAP 8 7 8      Hematology Recent Labs  Lab 02/23/21 0301 02/24/21 0257 02/25/21 0318  WBC 20.6* 20.0* 16.0*  RBC 5.97* 5.76 5.57  HGB 17.5* 16.9 16.2  HCT 53.8* 52.2* 51.0  MCV 90.1 90.6 91.6  MCH 29.3 29.3 29.1  MCHC 32.5 32.4 31.8  RDW 12.3 12.3 12.3  PLT 238 232 219    Cardiac EnzymesNo results for input(s): TROPONINI in the last 168 hours. No results for input(s): TROPIPOC in the last 168 hours.   BNPNo results for input(s): BNP, PROBNP in the last 168 hours.   DDimer No results for input(s): DDIMER in the last 168 hours.   Radiology    No results found.  Cardiac Studies   Echo:     1. Non-diagnostic bubble study despite 3 attempts. Would recommend TCDs  or TEE if clinically indicated.   2. Left ventricular ejection fraction, by estimation, is 60 to 65%. The  left ventricle has normal function. The left ventricle has no regional  wall motion abnormalities. Left ventricular diastolic parameters were  normal.   3. Right ventricular systolic function is normal. The right ventricular  size is normal. Tricuspid regurgitation signal is  inadequate for assessing  PA pressure.   4. The mitral valve is grossly normal. No evidence of mitral valve  regurgitation. No evidence of mitral stenosis.   5. The aortic valve is tricuspid. There is mild calcification of the  aortic valve. Aortic valve regurgitation is not visualized. No aortic  stenosis is present.   6. The inferior vena cava is normal in size with greater than 50%  respiratory variability, suggesting right atrial pressure of 3 mmHg.   Patient Profile     71 y.o. male with a hx of CAD (inferior STEMI 2012 s/p BMS to RCA with residual moderate nonobstructive disease in LAD), recurrent DVT (2008, 2014, tx with Xarelto), COPD, tobacco abuse with hx of chain smoking, HTN, HLD, bipolar disorder, depression, anxiety, prior polysubstance abuse (ETOH, heroin, cocaine, THC), pulmonary nodules, arthritis, PAD s/p L AKA complicated by PAF/flutter 09/2020 who is being seen 02/21/2021 for the evaluation of hypoxia and atrial fibrillation at the request of Dr. Tana Coast.   Assessment & Plan    Acute on Chronic hypoxic respiratory failure - COVID 19 (02/12/21 s/p tx)Chronic ILD, COPD Chronic HFpEF s/p 15 L diuresis HLD with DM CAD with prior PCI Prior DVT PAF Active Tobacco abuse past ETOH/Heroin, Cocaine, THC PAD s/p L AKA PVCs Protein calorine malnutrition on supllementation - on diltiazem 120 mg PO daily and metoprolol 25 mg PO BID, poor candidate for amiodarone but presently controlled rates, do not have additional room for AV nodal agents - on eliquis for multiple indications, it appears primary cardiologist has patient on DOAC and ASA due to severe thrombotic and vascular disease hgb stable no changes at this time - BNP for tomorrow dn will uses this to help set Po diuretic dose - GOC is reasonable  For questions or updates, please contact [facility] Please consult [email or web address] for contact info under Cardiology/STEMI.   Signed, Werner Lean, MD  02/25/2021, 8:06 AM

## 2021-02-26 LAB — CBC WITH DIFFERENTIAL/PLATELET
Abs Immature Granulocytes: 0.29 10*3/uL — ABNORMAL HIGH (ref 0.00–0.07)
Basophils Absolute: 0.1 10*3/uL (ref 0.0–0.1)
Basophils Relative: 1 %
Eosinophils Absolute: 0.1 10*3/uL (ref 0.0–0.5)
Eosinophils Relative: 1 %
HCT: 50.3 % (ref 39.0–52.0)
Hemoglobin: 16 g/dL (ref 13.0–17.0)
Immature Granulocytes: 2 %
Lymphocytes Relative: 17 %
Lymphs Abs: 2.2 10*3/uL (ref 0.7–4.0)
MCH: 29.3 pg (ref 26.0–34.0)
MCHC: 31.8 g/dL (ref 30.0–36.0)
MCV: 92 fL (ref 80.0–100.0)
Monocytes Absolute: 1 10*3/uL (ref 0.1–1.0)
Monocytes Relative: 8 %
Neutro Abs: 8.9 10*3/uL — ABNORMAL HIGH (ref 1.7–7.7)
Neutrophils Relative %: 71 %
Platelets: 204 10*3/uL (ref 150–400)
RBC: 5.47 MIL/uL (ref 4.22–5.81)
RDW: 12.4 % (ref 11.5–15.5)
WBC: 12.5 10*3/uL — ABNORMAL HIGH (ref 4.0–10.5)
nRBC: 0 % (ref 0.0–0.2)

## 2021-02-26 LAB — BASIC METABOLIC PANEL
Anion gap: 5 (ref 5–15)
BUN: 27 mg/dL — ABNORMAL HIGH (ref 8–23)
CO2: 32 mmol/L (ref 22–32)
Calcium: 8.4 mg/dL — ABNORMAL LOW (ref 8.9–10.3)
Chloride: 101 mmol/L (ref 98–111)
Creatinine, Ser: 0.76 mg/dL (ref 0.61–1.24)
GFR, Estimated: >60 mL/min (ref >60)
Glucose, Bld: 79 mg/dL (ref 70–99)
Potassium: 4 mmol/L (ref 3.5–5.1)
Sodium: 138 mmol/L (ref 135–145)

## 2021-02-26 LAB — BRAIN NATRIURETIC PEPTIDE: B Natriuretic Peptide: 45.1 pg/mL (ref 0.0–100.0)

## 2021-02-26 LAB — GLUCOSE, CAPILLARY
Glucose-Capillary: 90 mg/dL (ref 70–99)
Glucose-Capillary: 125 mg/dL — ABNORMAL HIGH (ref 70–99)
Glucose-Capillary: 122 mg/dL — ABNORMAL HIGH (ref 70–99)
Glucose-Capillary: 84 mg/dL (ref 70–99)

## 2021-02-26 LAB — MAGNESIUM: Magnesium: 2.2 mg/dL (ref 1.7–2.4)

## 2021-02-26 MED ORDER — FUROSEMIDE 20 MG PO TABS
20.0000 mg | ORAL_TABLET | ORAL | Status: DC
  Administered 2021-02-26 – 2021-03-02 (×3): 20 mg via ORAL
  Filled 2021-02-26 (×3): qty 1

## 2021-02-26 NOTE — Progress Notes (Signed)
Progress Note   Patient: Anthony [name]. EHM:[id number] DOB: [birth year] DOA: 02/12/2021     14 DOS: the patient was seen and examined on 02/26/2021   Brief hospital course: Mr. Zeidman is a 71 yo male with PMH CAD, PAD, HLD, COPD, bipolar, DM II who presented with worsening dyspnea at home.  He resides at a skilled nursing facility and was recently diagnosed with [place] outpatient around approximately 02/03/2021.  Due to becoming hypoxic, he was sent to the ER for further evaluation. He also underwent left AKA on 09/21/2020 and has had recurrent falls since. He was positive for COVID-19 on evaluation in the ER as well as significantly hypoxic requiring nonrebreather.  He was started on remdesivir, steroids, and breathing treatments. He was also evaluated for trauma from his falls and found to have multiple rib fractures bilaterally.  Difficult to wean O2, pulmonology following.  Assessment and Plan: * Acute respiratory failure with hypoxia ([place])- (present on admission) - Chronic underlying lung disease, states he is on O2 at home. CTA chest shows bilateral paraseptal emphysema with chronic ILD.  Also has multiple pulmonary nodules.  No PE. - pleural effusion not large enough to tap -Etiology for worsening hypoxia felt due to exacerbation of underlying COPD as well as superimposed COVID infection. Presumption is that he should improve slowly but steadily to return close to home settings - Difficult to wean O2, still on 15 L HFNC. May need LTACH for further slow weaning due to anticipated prolonged hospitalization  -On eliquis - echo on 2/14 shows EF 60-65%, normal diastology -Per cardiology, started on Lasix 20 mg p.o. every 48 hours; will need intermittent renal function monitoring at discharge as well  Paroxysmal atrial fibrillation with RVR ([place])- (present on admission) - Patient noted to be in A-fib with RVR with heart rate in 140s, started on beta-blocker 25 mg p.o. twice daily -Currently  normal sinus rhythm, on Eliquis - cardiology following as well. Echo on 2/14 shows normal EF, no diastolic dysfunction  COPD (chronic obstructive pulmonary disease) ([place])- (present on admission) -Mild exacerbation in the setting of ongoing COVID infection  --Chest x-ray showed bibasilar left greater than right areas of atelectasis or consolidation.  Procalcitonin <0.1 - appreciate pulmonology assistance   COVID-19 virus infection-resolved as of 02/24/2021 - Reportedly diagnosed approximately 1 week prior to admission but unclear if he has been treated - Given worsening oxygen demands and several comorbidities, completed remdesivir on 2/9  -On p.o. prednisone, Pulmicort - okay to d/c precautions; done on 2/17  History of DVT (deep vein thrombosis) Patient was on Xarelto prior to admission. -Venous Doppler showed a new DVT in the right lower extremity from proximal femoral to the popliteal and in single posterior tibial vein.   Also has bilateral arterial disease with occlusion of the proximal-distal LT SFA and a focal dilitation of the proximal RT SFA to 2.5 cm -Patient has known history of PAD.  Placed on therapeutic eliquis.    Hypokalemia - Replete PRN  Rib fractures - Likely due to recurrent falls after his left AKA - CTA chest shows left 3rd - 5th and left 5th and 9th rib fractures -Continue incentive spirometry   HTN (hypertension) - BP stable  Bipolar 1 disorder (HCC) - continue mirtazapine,  -Flat affect now improved; pulmonology started Lexapro and as needed Atarax  Multiple lung nodules- (present on admission) - Appears to be known from 2020 - CTA chest shows 6 mm left lower lobe, 3 and 5 mm right  middle lobe - appears stable/smaller from prior imagining, continue outpatient monitoring -Will need outpatient pulmonology follow-up   DM2 (diabetes mellitus, type 2) ([place]) - A1c 7.7% on admission - CBGs elevated due to steroids, improving since transition to oral  prednisone. -Continue Semglee and SSI   Hyperlipidemia associated with type 2 diabetes mellitus ([place])- (present on admission) - Continue Lipitor  CAD (coronary artery disease)- (present on admission) -Currently on aspirin, beta-blocker, statin, eliquis    Subjective: No events overnight. Breathing remains comfortable. O2 weaned some since yesterday.   Physical Exam: Vitals:   02/26/21 0933 02/26/21 1000 02/26/21 1100 02/26/21 1200  BP: 111/64 116/66  (!) 102/57  Pulse: 74 76 (!) 53 (!) 54  Resp:  (!) 24 19 (!) 23  Temp:      TempSrc:      SpO2:  90% 95% (!) 87%  Weight:      Height:      Physical Exam Constitutional:      General: He is not in acute distress.    Comments: Fatigued but no distress  HENT:     Head: Normocephalic and atraumatic.     Mouth/Throat:     Mouth: Mucous membranes are moist.  Eyes:     Extraocular Movements: Extraocular movements intact.  Cardiovascular:     Rate and Rhythm: Normal rate and regular rhythm.     Heart sounds: Normal heart sounds.  Pulmonary:     Effort: Pulmonary effort is normal. No respiratory distress.     Comments: Bilateral coarse breath sounds Abdominal:     General: Bowel sounds are normal. There is no distension.     Palpations: Abdomen is soft.     Tenderness: There is no abdominal tenderness.  Musculoskeletal:        General: Normal range of motion.     Cervical back: Normal range of motion and neck supple.  Skin:    General: Skin is warm and dry.  Neurological:     General: No focal deficit present.  Psychiatric:        Mood and Affect: Mood normal.     Data Reviewed:  I have Reviewed nursing notes, Vitals, and Lab results since pt's last encounter. Pertinent lab results : see below I have ordered test including BMP, CBC, Mg I have reviewed the last note from staff over past 24 hours,  I have discussed pt's care plan and test results with nursing staff, CM. Results for orders placed or performed during the  hospital encounter of 02/12/21 (from the past 24 hour(s))  Glucose, capillary     Status: None   Collection Time: 02/25/21  4:43 PM  Result Value Ref Range   Glucose-Capillary 94 70 - 99 mg/dL  Glucose, capillary     Status: Abnormal   Collection Time: 02/25/21  9:18 PM  Result Value Ref Range   Glucose-Capillary 129 (H) 70 - 99 mg/dL  CBC with Differential/Platelet     Status: Abnormal   Collection Time: 02/26/21  2:56 AM  Result Value Ref Range   WBC 12.5 (H) 4.0 - 10.5 K/uL   RBC 5.47 4.22 - 5.81 MIL/uL   Hemoglobin 16.0 13.0 - 17.0 g/dL   HCT 50.3 39.0 - 52.0 %   MCV 92.0 80.0 - 100.0 fL   MCH 29.3 26.0 - 34.0 pg   MCHC 31.8 30.0 - 36.0 g/dL   RDW 12.4 11.5 - 15.5 %   Platelets 204 150 - 400 K/uL   nRBC 0.0 0.0 -  0.2 %   Neutrophils Relative % 71 %   Neutro Abs 8.9 (H) 1.7 - 7.7 K/uL   Lymphocytes Relative 17 %   Lymphs Abs 2.2 0.7 - 4.0 K/uL   Monocytes Relative 8 %   Monocytes Absolute 1.0 0.1 - 1.0 K/uL   Eosinophils Relative 1 %   Eosinophils Absolute 0.1 0.0 - 0.5 K/uL   Basophils Relative 1 %   Basophils Absolute 0.1 0.0 - 0.1 K/uL   Immature Granulocytes 2 %   Abs Immature Granulocytes 0.29 (H) 0.00 - 0.07 K/uL  Basic metabolic panel     Status: Abnormal   Collection Time: 02/26/21  2:56 AM  Result Value Ref Range   Sodium 138 135 - 145 mmol/L   Potassium 4.0 3.5 - 5.1 mmol/L   Chloride 101 98 - 111 mmol/L   CO2 32 22 - 32 mmol/L   Glucose, Bld 79 70 - 99 mg/dL   BUN 27 (H) 8 - 23 mg/dL   Creatinine, Ser 0.76 0.61 - 1.24 mg/dL   Calcium 8.4 (L) 8.9 - 10.3 mg/dL   GFR, Estimated >60 >60 mL/min   Anion gap 5 5 - 15  Magnesium     Status: None   Collection Time: 02/26/21  2:56 AM  Result Value Ref Range   Magnesium 2.2 1.7 - 2.4 mg/dL  Brain natriuretic peptide     Status: None   Collection Time: 02/26/21  2:56 AM  Result Value Ref Range   B Natriuretic Peptide 45.1 0.0 - 100.0 pg/mL  Glucose, capillary     Status: None   Collection Time: 02/26/21  7:56  AM  Result Value Ref Range   Glucose-Capillary 90 70 - 99 mg/dL  Glucose, capillary     Status: Abnormal   Collection Time: 02/26/21 11:46 AM  Result Value Ref Range   Glucose-Capillary 125 (H) 70 - 99 mg/dL   Comment 1 Notify RN    Comment 2 Document in Chart      Family Communication:   Disposition: Status is: Inpatient Remains inpatient appropriate because: Treatment as outlined in A&P    Planned Discharge Destination: LTAC possibly   DVT PPX: apixaban Ar[place])    Author: Dwyane Dee, MD 02/26/2021 12:06 PM  For on call review [email or web address].

## 2021-02-26 NOTE — Progress Notes (Signed)
Progress Note  Patient Name: Anthony [name]. Date of Encounter: 02/26/2021  Primary Cardiologist:   Lauree Chandler, MD   Subjective   BNP normal at 43 Patient notes that he feels ok but is wondering how long it will take to get back to normal.  Inpatient Medications    Scheduled Meds:  apixaban  5 mg Oral BID   vitamin C  500 mg Oral Daily   aspirin EC  81 mg Oral Daily   atorvastatin  40 mg Oral Daily   benzonatate  200 mg Oral TID   budesonide (PULMICORT) nebulizer solution  0.5 mg Nebulization BID   Chlorhexidine Gluconate Cloth  6 each Topical Q0600   diltiazem  120 mg Oral Daily   escitalopram  10 mg Oral QHS   feeding supplement (GLUCERNA SHAKE)  237 mL Oral BID BM   gabapentin  100 mg Oral BID   guaiFENesin  1,200 mg Oral BID   insulin aspart  0-20 Units Subcutaneous TID WC   insulin aspart  0-5 Units Subcutaneous QHS   insulin aspart  10 Units Subcutaneous TID WC   insulin glargine-yfgn  26 Units Subcutaneous QHS   ipratropium-albuterol  3 mL Nebulization TID   mouth rinse  15 mL Mouth Rinse BID   metoprolol tartrate  25 mg Oral BID   mirtazapine  30 mg Oral QHS   multivitamin with minerals  1 tablet Oral Daily   Ensure Max Protein  11 oz Oral Daily   zinc sulfate  220 mg Oral Daily   Continuous Infusions:  sodium chloride Stopped (02/17/21 0854)   PRN Meds: sodium chloride, acetaminophen **OR** acetaminophen, chlorpheniramine-HYDROcodone, hydrOXYzine, saline, sodium chloride   Vital Signs    Vitals:   02/26/21 0832 02/26/21 0851 02/26/21 0900 02/26/21 0933  BP:    111/64  Pulse:   80 74  Resp:   (!) 25   Temp:  98.4 F (36.9 C)    TempSrc:  Oral    SpO2: 90%  (!) 88%   Weight:      Height:        Intake/Output Summary (Last 24 hours) at 02/26/2021 1017 Last data filed at 02/26/2021 2458 Gross per 24 hour  Intake --  Output 1125 ml  Net -1125 ml   Filed Weights   02/12/21 1823  Weight: 87.2 kg    Telemetry    SR to sinus  brady with PVCs - Personally Reviewed  Physical Exam   Gen: no distress, thin elderly male Neck: No JVD,  Cardiac: No Rubs or Gallops, regularly irregular bradycardia Respiratory: expiratory bilateral rhonchi with tachypnea GI: Soft, nontender, non-distended  MS: No  edema;  L stump site without drainage Integument: Skin feels warm Neuro:  At time of evaluation, alert and oriented to person/place/time/situation  Psych: Normal affect, patient feels ok   Labs    Chemistry Recent Labs  Lab 02/24/21 0257 02/25/21 0318 02/26/21 0256  NA 137 140 138  K 4.0 3.8 4.0  CL 100 102 101  CO2 30 30 32  GLUCOSE 140* 79 79  BUN 27* 27* 27*  CREATININE 0.67 0.78 0.76  CALCIUM 8.8* 8.7* 8.4*  GFRNONAA >60 >60 >60  ANIONGAP 7 8 5      Hematology Recent Labs  Lab 02/24/21 0257 02/25/21 0318 02/26/21 0256  WBC 20.0* 16.0* 12.5*  RBC 5.76 5.57 5.47  HGB 16.9 16.2 16.0  HCT 52.2* 51.0 50.3  MCV 90.6 91.6 92.0  MCH 29.3 29.1 29.3  MCHC 32.4 31.8 31.8  RDW 12.3 12.3 12.4  PLT 232 219 204    Cardiac EnzymesNo results for input(s): TROPONINI in the last 168 hours. No results for input(s): TROPIPOC in the last 168 hours.   BNP Recent Labs  Lab 02/26/21 0256  BNP 45.1     DDimer No results for input(s): DDIMER in the last 168 hours.   Radiology    No results found.  Cardiac Studies   Echo:     1. Non-diagnostic bubble study despite 3 attempts. Would recommend TCDs  or TEE if clinically indicated.   2. Left ventricular ejection fraction, by estimation, is 60 to 65%. The  left ventricle has normal function. The left ventricle has no regional  wall motion abnormalities. Left ventricular diastolic parameters were  normal.   3. Right ventricular systolic function is normal. The right ventricular  size is normal. Tricuspid regurgitation signal is inadequate for assessing  PA pressure.   4. The mitral valve is grossly normal. No evidence of mitral valve  regurgitation. No  evidence of mitral stenosis.   5. The aortic valve is tricuspid. There is mild calcification of the  aortic valve. Aortic valve regurgitation is not visualized. No aortic  stenosis is present.   6. The inferior vena cava is normal in size with greater than 50%  respiratory variability, suggesting right atrial pressure of 3 mmHg.   Patient Profile     71 y.o. male with a hx of CAD (inferior STEMI 2012 s/p BMS to RCA with residual moderate nonobstructive disease in LAD), recurrent DVT (2008, 2014, tx with Xarelto), COPD, tobacco abuse with hx of chain smoking, HTN, HLD, bipolar disorder, depression, anxiety, prior polysubstance abuse (ETOH, heroin, cocaine, THC), pulmonary nodules, arthritis, PAD s/p L AKA complicated by PAF/flutter 09/2020 who is being seen 02/21/2021 for the evaluation of hypoxia and atrial fibrillation at the request of Dr. Tana Coast.   Assessment & Plan    Acute on Chronic hypoxic respiratory failure - COVID 19 (02/12/21 s/p tx)Chronic ILD, COPD Chronic HFpEF s/p 15 L diuresis HLD with DM CAD with prior PCI Prior DVT PAF Active Tobacco abuse past ETOH/Heroin, Cocaine, THC PAD s/p L AKA PVCs Protein calorine malnutrition on supllementation - on diltiazem 120 mg PO daily and metoprolol 25 mg PO BID, poor candidate for amiodarone but presently controlled rates, do not have additional room for AV nodal agents - on eliquis for multiple indications, it appears primary cardiologist has patient on DOAC and ASA due to severe thrombotic and vascular disease hgb stable no changes at this time - GOC is reasonable, presently considered for [facility] for progressive wean - will start lasix 20 mg PO q48 to help keep fluid off   [facility] will sign off.   Medication Recommendations:  lasix 20 mg PO q48 Other recommendations (labs, testing, etc):  NA Follow up as an outpatient:  Will arrange outpatient f/u     For questions or updates, please contact [place] Please consult  [email or web address] for contact info under Cardiology/STEMI.   Signed, Werner Lean, MD  02/26/2021, 10:17 AM

## 2021-02-27 LAB — BASIC METABOLIC PANEL
Anion gap: 7 (ref 5–15)
BUN: 21 mg/dL (ref 8–23)
CO2: 30 mmol/L (ref 22–32)
Calcium: 8.3 mg/dL — ABNORMAL LOW (ref 8.9–10.3)
Chloride: 100 mmol/L (ref 98–111)
Creatinine, Ser: 0.78 mg/dL (ref 0.61–1.24)
GFR, Estimated: >60 mL/min (ref >60)
Glucose, Bld: 169 mg/dL — ABNORMAL HIGH (ref 70–99)
Potassium: 3.5 mmol/L (ref 3.5–5.1)
Sodium: 137 mmol/L (ref 135–145)

## 2021-02-27 LAB — CBC WITH DIFFERENTIAL/PLATELET
Abs Immature Granulocytes: 0.28 10*3/uL — ABNORMAL HIGH (ref 0.00–0.07)
Basophils Absolute: 0 10*3/uL (ref 0.0–0.1)
Basophils Relative: 0 %
Eosinophils Absolute: 0.1 10*3/uL (ref 0.0–0.5)
Eosinophils Relative: 1 %
HCT: 46.4 % (ref 39.0–52.0)
Hemoglobin: 15.3 g/dL (ref 13.0–17.0)
Immature Granulocytes: 2 %
Lymphocytes Relative: 15 %
Lymphs Abs: 1.8 10*3/uL (ref 0.7–4.0)
MCH: 29.6 pg (ref 26.0–34.0)
MCHC: 33 g/dL (ref 30.0–36.0)
MCV: 89.7 fL (ref 80.0–100.0)
Monocytes Absolute: 0.9 10*3/uL (ref 0.1–1.0)
Monocytes Relative: 8 %
Neutro Abs: 8.6 10*3/uL — ABNORMAL HIGH (ref 1.7–7.7)
Neutrophils Relative %: 74 %
Platelets: 188 10*3/uL (ref 150–400)
RBC: 5.17 MIL/uL (ref 4.22–5.81)
RDW: 12.3 % (ref 11.5–15.5)
WBC: 11.7 10*3/uL — ABNORMAL HIGH (ref 4.0–10.5)
nRBC: 0 % (ref 0.0–0.2)

## 2021-02-27 LAB — GLUCOSE, CAPILLARY
Glucose-Capillary: 96 mg/dL (ref 70–99)
Glucose-Capillary: 130 mg/dL — ABNORMAL HIGH (ref 70–99)
Glucose-Capillary: 129 mg/dL — ABNORMAL HIGH (ref 70–99)
Glucose-Capillary: 124 mg/dL — ABNORMAL HIGH (ref 70–99)

## 2021-02-27 LAB — MAGNESIUM: Magnesium: 2.1 mg/dL (ref 1.7–2.4)

## 2021-02-27 NOTE — Progress Notes (Signed)
Progress Note   Patient: Anthony [name]. NGE:[id number] DOB: 1950/04/24 DOA: 02/12/2021     15 DOS: the patient was seen and examined on 02/27/2021   Brief hospital course: Anthony [name] is a 71 yo male with PMH CAD, PAD, HLD, COPD, bipolar, DM II who presented with worsening dyspnea at home.  He resides at a skilled nursing facility and was recently diagnosed with [place] outpatient around approximately 02/03/2021.  Due to becoming hypoxic, he was sent to the ER for further evaluation. He also underwent left AKA on 09/21/2020 and has had recurrent falls since. He was positive for COVID-19 on evaluation in the ER as well as significantly hypoxic requiring nonrebreather.  He was started on remdesivir, steroids, and breathing treatments. He was also evaluated for trauma from his falls and found to have multiple rib fractures bilaterally.  Difficult to wean O2, pulmonology following.  Assessment and Plan: * Acute respiratory failure with hypoxia ([place])- (present on admission) - Chronic underlying lung disease, states he is on O2 at home. CTA chest shows bilateral paraseptal emphysema with chronic ILD.  Also has multiple pulmonary nodules.  No PE. - pleural effusion not large enough to tap -Etiology for worsening hypoxia felt due to exacerbation of underlying COPD as well as superimposed COVID infection. Presumption is that he should improve slowly but steadily to return close to home settings - Difficult to wean O2, still on 15 L HFNC. May need LTACH for further slow weaning due to anticipated prolonged hospitalization  -On eliquis - echo on 2/14 shows EF 60-65%, normal diastology -Per cardiology, started on Lasix 20 mg p.o. every 48 hours; will need intermittent renal function monitoring at discharge as well  Paroxysmal atrial fibrillation with RVR ([place])- (present on admission) - Patient noted to be in A-fib with RVR with heart rate in 140s, started on beta-blocker 25 mg p.o. twice daily -Currently  normal sinus rhythm, on Eliquis - cardiology following as well. Echo on 2/14 shows normal EF, no diastolic dysfunction  COPD (chronic obstructive pulmonary disease) ([place])- (present on admission) -Mild exacerbation in the setting of ongoing COVID infection  --Chest x-ray showed bibasilar left greater than right areas of atelectasis or consolidation.  Procalcitonin <0.1 - appreciate pulmonology assistance   COVID-19 virus infection-resolved as of 02/24/2021 - Reportedly diagnosed approximately 1 week prior to admission but unclear if he has been treated - Given worsening oxygen demands and several comorbidities, completed remdesivir on 2/9  -On p.o. prednisone, Pulmicort - okay to d/c precautions; done on 2/17  History of DVT (deep vein thrombosis) Patient was on Xarelto prior to admission. -Venous Doppler showed a new DVT in the right lower extremity from proximal femoral to the popliteal and in single posterior tibial vein.   Also has bilateral arterial disease with occlusion of the proximal-distal LT SFA and a focal dilitation of the proximal RT SFA to 2.5 cm -Patient has known history of PAD.  Placed on therapeutic eliquis.    Hypokalemia - Replete PRN  Rib fractures - Likely due to recurrent falls after his left AKA - CTA chest shows left 3rd - 5th and left 5th and 9th rib fractures -Continue incentive spirometry   HTN (hypertension) - BP stable  Bipolar 1 disorder (HCC) - continue mirtazapine,  -Flat affect now improved; pulmonology started Lexapro and as needed Atarax  Multiple lung nodules- (present on admission) - Appears to be known from 2020 - CTA chest shows 6 mm left lower lobe, 3 and 5 mm right  middle lobe - appears stable/smaller from prior imagining, continue outpatient monitoring -Will need outpatient pulmonology follow-up   DM2 (diabetes mellitus, type 2) ([place]) - A1c 7.7% on admission - CBGs elevated due to steroids, improving since transition to oral  prednisone. -Continue Semglee and SSI   Hyperlipidemia associated with type 2 diabetes mellitus ([place])- (present on admission) - Continue Lipitor  CAD (coronary artery disease)- (present on admission) -Currently on aspirin, beta-blocker, statin, eliquis    Subjective: No events overnight. Breathing remains comfortable. I turned him to 10L this morning.   Physical Exam: Vitals:   02/27/21 0835 02/27/21 0900 02/27/21 1000 02/27/21 1136  BP:  (!) 98/59 117/69   Pulse:  65 73   Resp:  (!) 24 (!) 24   Temp:    (!) 97.5 F (36.4 C)  TempSrc:    Axillary  SpO2: 93% (!) 80% (!) 89%   Weight:      Height:      Physical Exam Constitutional:      General: He is not in acute distress.    Comments: Fatigued but no distress  HENT:     Head: Normocephalic and atraumatic.     Mouth/Throat:     Mouth: Mucous membranes are moist.  Eyes:     Extraocular Movements: Extraocular movements intact.  Cardiovascular:     Rate and Rhythm: Normal rate and regular rhythm.     Heart sounds: Normal heart sounds.  Pulmonary:     Effort: Pulmonary effort is normal. No respiratory distress.     Comments: Bilateral coarse breath sounds Abdominal:     General: Bowel sounds are normal. There is no distension.     Palpations: Abdomen is soft.     Tenderness: There is no abdominal tenderness.  Musculoskeletal:        General: Normal range of motion.     Cervical back: Normal range of motion and neck supple.  Skin:    General: Skin is warm and dry.  Neurological:     General: No focal deficit present.  Psychiatric:        Mood and Affect: Mood normal.     Data Reviewed:  I have Reviewed nursing notes, Vitals, and Lab results since pt's last encounter. Pertinent lab results : see below I have ordered test including BMP, CBC, Mg I have reviewed the last note from staff over past 24 hours,  I have discussed pt's care plan and test results with nursing staff, CM. Results for orders placed or  performed during the hospital encounter of 02/12/21 (from the past 24 hour(s))  Glucose, capillary     Status: Abnormal   Collection Time: 02/26/21  3:52 PM  Result Value Ref Range   Glucose-Capillary 122 (H) 70 - 99 mg/dL  Glucose, capillary     Status: None   Collection Time: 02/26/21  9:54 PM  Result Value Ref Range   Glucose-Capillary 84 70 - 99 mg/dL  CBC with Differential/Platelet     Status: Abnormal   Collection Time: 02/27/21  2:46 AM  Result Value Ref Range   WBC 11.7 (H) 4.0 - 10.5 K/uL   RBC 5.17 4.22 - 5.81 MIL/uL   Hemoglobin 15.3 13.0 - 17.0 g/dL   HCT 46.4 39.0 - 52.0 %   MCV 89.7 80.0 - 100.0 fL   MCH 29.6 26.0 - 34.0 pg   MCHC 33.0 30.0 - 36.0 g/dL   RDW 12.3 11.5 - 15.5 %   Platelets 188 150 - 400 K/uL  nRBC 0.0 0.0 - 0.2 %   Neutrophils Relative % 74 %   Neutro Abs 8.6 (H) 1.7 - 7.7 K/uL   Lymphocytes Relative 15 %   Lymphs Abs 1.8 0.7 - 4.0 K/uL   Monocytes Relative 8 %   Monocytes Absolute 0.9 0.1 - 1.0 K/uL   Eosinophils Relative 1 %   Eosinophils Absolute 0.1 0.0 - 0.5 K/uL   Basophils Relative 0 %   Basophils Absolute 0.0 0.0 - 0.1 K/uL   Immature Granulocytes 2 %   Abs Immature Granulocytes 0.28 (H) 0.00 - 0.07 K/uL  Basic metabolic panel     Status: Abnormal   Collection Time: 02/27/21  2:46 AM  Result Value Ref Range   Sodium 137 135 - 145 mmol/L   Potassium 3.5 3.5 - 5.1 mmol/L   Chloride 100 98 - 111 mmol/L   CO2 30 22 - 32 mmol/L   Glucose, Bld 169 (H) 70 - 99 mg/dL   BUN 21 8 - 23 mg/dL   Creatinine, Ser 0.78 0.61 - 1.24 mg/dL   Calcium 8.3 (L) 8.9 - 10.3 mg/dL   GFR, Estimated >60 >60 mL/min   Anion gap 7 5 - 15  Magnesium     Status: None   Collection Time: 02/27/21  2:46 AM  Result Value Ref Range   Magnesium 2.1 1.7 - 2.4 mg/dL  Glucose, capillary     Status: None   Collection Time: 02/27/21  7:35 AM  Result Value Ref Range   Glucose-Capillary 96 70 - 99 mg/dL   Comment 1 Notify RN    Comment 2 Document in Chart   Glucose,  capillary     Status: Abnormal   Collection Time: 02/27/21 11:31 AM  Result Value Ref Range   Glucose-Capillary 130 (H) 70 - 99 mg/dL   Comment 1 Notify RN    Comment 2 Document in Chart      Family Communication:   Disposition: Status is: Inpatient Remains inpatient appropriate because: Treatment as outlined in A&P    Planned Discharge Destination:  Denied by LTAC; will have to return to [facility] once O2 weaned further  DVT PPX: apixaban [place])    Author: Dwyane Dee, MD 02/27/2021 12:37 PM  For on call review [email or web address].

## 2021-02-27 NOTE — Progress Notes (Signed)
Nutrition Follow-up  DOCUMENTATION CODES:   Not applicable  INTERVENTION:  - continue Glucerna Shake BID and Ensure Max once/day.  - weigh patient today.    NUTRITION DIAGNOSIS:   Increased nutrient needs related to acute illness, catabolic illness (PJASN-05 infection) as evidenced by estimated needs. -ongoing  GOAL:   Patient will meet greater than or equal to 90% of their needs -met on average  MONITOR:   PO intake, Supplement acceptance, Labs, Weight trends  ASSESSMENT:   71 y.o. male with medical history of CAD, PAD, HLD, COPD, bipolar disorder, and DM. he presented to the ED with dyspnea, hypoxia at a facility, and outpatient dx of COVID-19 (around 02/03/21). He had L AKA on 09/21/20 and his daughter reported he has had multiple falls since that time.  Patient continues to eat 75-100% at meals on average. He has been accepting Glucerna Shake 100% of the time offered and Ensure Max 100% of the time offered.   He has not been weighed since admission on 2/5. Non-pitting edema to LLE documented in the edema section of flow sheet.    Labs reviewed; CBG: 96 mg/dl, Ca: 8.3 mg/dl.  Medications reviewed; 500 mg ascorbic acid/day, 20 mg oral lasix every 48 hours starting 2/19, sliding scale novolog, 10 units novolog TID, 26 units semglee/day, 1 tablet multivitamin with minerals/day, 220 mg zinc sulfate/day.   Diet Order:   Diet Order             Diet Carb Modified Fluid consistency: Thin; Room service appropriate? Yes  Diet effective now                   EDUCATION NEEDS:   No education needs have been identified at this time  Skin:  Skin Assessment: Skin Integrity Issues: Skin Integrity Issues:: Stage II Stage II: buttocks  Last BM:  2/19 (type 2, large amount)  Height:   Ht Readings from Last 1 Encounters:  02/12/21 _0  (1.753 m)    Weight:   Wt Readings from Last 1 Encounters:  02/12/21 87.2 kg     BMI:  Body mass index is 28.39  kg/m.   Estimated Nutritional Needs:  Kcal:  1900-2100 kcal Protein:  105-120 grams Fluid:  >/= 2.3 L/day      Jarome Matin, MS, RD, LDN Inpatient Clinical Dietitian RD pager # available in [place]  After hours/weekend pager # available in [facility]

## 2021-02-27 NOTE — Plan of Care (Signed)
  Problem: Education: Goal: Knowledge of General Education information will improve Description: Including pain rating scale, medication(s)/side effects and non-pharmacologic comfort measures Outcome: Progressing   Problem: Health Behavior/Discharge Planning: Goal: Ability to manage health-related needs will improve Outcome: Progressing   Problem: Clinical Measurements: Goal: Ability to maintain clinical measurements within normal limits will improve Outcome: Progressing   Problem: Clinical Measurements: Goal: Will remain free from infection Outcome: Progressing   Problem: Clinical Measurements: Goal: Diagnostic test results will improve Outcome: Progressing   Problem: Clinical Measurements: Goal: Cardiovascular complication will be avoided Outcome: Progressing   Problem: Activity: Goal: Risk for activity intolerance will decrease Outcome: Progressing   

## 2021-02-27 NOTE — Progress Notes (Signed)
Physical Therapy Treatment Patient Details Name: Anthony [name]. MRN: [id number] DOB: September 04, 1950 Today's Date: 02/27/2021   History of Present Illness Patient is a 71 year old male who resides at [facility] place and admitted for acute respiratory failure 2* COVID. PMH includes L AKA, hx falls    PT Comments    Pt tolerated x2 stand/step pivot transfers requiring light physical assist to perform. Pt requiring frequent safety cues during mobility, appears to lack safety awareness. Pt instructed in LE exercises to perform while OOB in chair. SpO2 maintained 89% and greater on 15LO2.      Recommendations for follow up therapy are one component of a multi-disciplinary discharge planning process, led by the attending physician.  Recommendations may be updated based on patient status, additional functional criteria and insurance authorization.  Follow Up Recommendations  PT at Long-term acute care hospital     Assistance Recommended at Discharge Frequent or constant Supervision/Assistance  Patient can return home with the following Direct supervision/assist for medications management;Assist for transportation;Direct supervision/assist for financial management;Help with stairs or ramp for entrance;A little help with walking and/or transfers;A little help with bathing/dressing/bathroom   Equipment Recommendations  None recommended by PT    Recommendations for Other Services       Precautions / Restrictions Precautions Precautions: Fall Precaution Comments: multiple B rib fractures, Lt AKA, on HFNC 15 L Restrictions Weight Bearing Restrictions: No     Mobility  Bed Mobility Overal bed mobility: Needs Assistance Bed Mobility: Supine to Sit     Supine to sit: Min assist     General bed mobility comments: light assist for righting, scooting to EOB.    Transfers Overall transfer level: Needs assistance Equipment used: Rolling walker (2 wheels) Transfers: Sit to/from Stand, Bed to  chair/wheelchair/BSC Sit to Stand: Min assist Stand pivot transfers: Min assist Step pivot transfers: Min assist, +2 safety/equipment       General transfer comment: assist for power up, rise, steadying, and pivot to/from [facility]. STS x2, from EOB and BSC and step pivot x1 to reach recliner from [facility]. Cues for placement in RW, lines/leads.    Ambulation/Gait                   Stairs             Wheelchair Mobility    Modified Rankin (Stroke Patients Only)       Balance Overall balance assessment: History of Falls, Needs assistance Sitting-balance support: Feet supported Sitting balance-Leahy Scale: Fair     Standing balance support: Reliant on assistive device for balance Standing balance-Leahy Scale: Poor Standing balance comment: reliant on external support                            Cognition Arousal/Alertness: Awake/alert Behavior During Therapy: Flat affect Overall Cognitive Status: No family/caregiver present to determine baseline cognitive functioning                                 General Comments: Pt requires repeated cuing during mobility, multiple safety cues especially when transitioning sit<>stand for safe hand placement        Exercises General Exercises - Lower Extremity Ankle Circles/Pumps: AROM, Right, 15 reps, Seated Quad Sets: AROM, Right, 5 reps, Seated    General Comments General comments (skin integrity, edema, etc.): 15LO2 via HFNC, pt satting 89-96% during session. cues for  breathing technique      Pertinent Vitals/Pain Pain Assessment Pain Assessment: Faces Faces Pain Scale: Hurts a little bit Pain Location: generalized Pain Descriptors / Indicators: Sore, Discomfort Pain Intervention(s): Monitored during session, Limited activity within patient's tolerance, Repositioned    Home Living                          Prior Function            PT Goals (current goals can now be found in  the care plan section) Acute Rehab PT Goals Patient Stated Goal: get prosthetic and rehab to bemore mobile PT Goal Formulation: With patient Time For Goal Achievement: 03/02/21 Potential to Achieve Goals: Fair Progress towards PT goals: Progressing toward goals    Frequency    Min 2X/week      PT Plan Current plan remains appropriate    Co-evaluation              AM-PAC PT "6 Clicks" Mobility   Outcome Measure  Help needed turning from your back to your side while in a flat bed without using bedrails?: A Little Help needed moving from lying on your back to sitting on the side of a flat bed without using bedrails?: A Little Help needed moving to and from a bed to a chair (including a wheelchair)?: A Lot Help needed standing up from a chair using your arms (e.g., wheelchair or bedside chair)?: A Lot Help needed to walk in hospital room?: A Lot Help needed climbing 3-5 steps with a railing? : Total 6 Click Score: 13    End of Session Equipment Utilized During Treatment: Gait belt Activity Tolerance: Patient tolerated treatment well Patient left: in chair;with call bell/phone within reach;with chair alarm set Nurse Communication: Mobility status PT Visit Diagnosis: Unsteadiness on feet (R26.81);Other abnormalities of gait and mobility (R26.89);Muscle weakness (generalized) (M62.81);Difficulty in walking, not elsewhere classified (R26.2)     Time: 7680-8811 PT Time Calculation (min) (ACUTE ONLY): 27 min  Charges:  $Therapeutic Activity: 23-37 mins                    Stacie Glaze, PT DPT Acute Rehabilitation Services Pager [phone]  Office [phone]    Roxine Caddy E Ruffin Pyo 02/27/2021, 1:22 PM

## 2021-02-27 NOTE — TOC Progression Note (Addendum)
Transition of Care (TOC) - Progression Note    Patient Details  Name: Anthony [name]. MRN: [id number] Date of Birth: Sep 07, 1950  Transition of Care [facility]) CM/SW Contact  Ross Ludwig, [place] Phone Number: 02/27/2021, 12:19 PM  Clinical Narrative:     CSW was informed that patient's insurance's appeal has upheld the denial.  Patient will not be approved for LTAC, per insurance company, patient should return back to [facility] where he is LTC per [facility].  TOC to continue to follow patient's progress throughout discharge planning.   Expected Discharge Plan: [place] Barriers to Discharge: Continued Medical Work up  Expected Discharge Plan and Services Expected Discharge Plan: [place] In-house Referral: Clinical Social Work   Post Acute Care Choice: Independence Living arrangements for the past 2 months: [place]                 DME Arranged: N/A DME Agency: NA                   Social Determinants of Health (SDOH) Interventions    Readmission Risk Interventions Readmission Risk Prevention Plan 02/13/2021  Transportation Screening Complete  PCP or Specialist Appt within 5-7 Days Complete  Home Care Screening Complete  Medication Review (RN CM) Complete  Some recent data might be hidden

## 2021-02-28 LAB — BASIC METABOLIC PANEL
Anion gap: 5 (ref 5–15)
BUN: 16 mg/dL (ref 8–23)
CO2: 28 mmol/L (ref 22–32)
Calcium: 8.3 mg/dL — ABNORMAL LOW (ref 8.9–10.3)
Chloride: 104 mmol/L (ref 98–111)
Creatinine, Ser: 0.65 mg/dL (ref 0.61–1.24)
GFR, Estimated: >60 mL/min (ref >60)
Glucose, Bld: 88 mg/dL (ref 70–99)
Potassium: 3.7 mmol/L (ref 3.5–5.1)
Sodium: 137 mmol/L (ref 135–145)

## 2021-02-28 LAB — GLUCOSE, CAPILLARY
Glucose-Capillary: 93 mg/dL (ref 70–99)
Glucose-Capillary: 96 mg/dL (ref 70–99)
Glucose-Capillary: 134 mg/dL — ABNORMAL HIGH (ref 70–99)
Glucose-Capillary: 92 mg/dL (ref 70–99)

## 2021-02-28 LAB — MAGNESIUM: Magnesium: 2.2 mg/dL (ref 1.7–2.4)

## 2021-02-28 NOTE — Progress Notes (Signed)
Progress Note   Patient: Anthony [name]. UXL:[id number] DOB: [birth year] DOA: 02/12/2021     16 DOS: the patient was seen and examined on 02/28/2021   Brief hospital course: Mr. Hulse is a 71 yo male with PMH CAD, PAD, HLD, COPD, bipolar, DM II who presented with worsening dyspnea at home.  He resides at a skilled nursing facility and was recently diagnosed with [place] outpatient around approximately 02/03/2021.  Due to becoming hypoxic, he was sent to the ER for further evaluation. He also underwent left AKA on 09/21/2020 and has had recurrent falls since. He was positive for COVID-19 on evaluation in the ER as well as significantly hypoxic requiring nonrebreather.  He was started on remdesivir, steroids, and breathing treatments. He was also evaluated for trauma from his falls and found to have multiple rib fractures bilaterally.  Difficult to wean O2, pulmonology following.  Assessment and Plan: * Acute respiratory failure with hypoxia ([place])- (present on admission) - Chronic underlying lung disease, states he is on O2 at home. CTA chest shows bilateral paraseptal emphysema with chronic ILD.  Also has multiple pulmonary nodules.  No PE. - pleural effusion not large enough to tap -Etiology for worsening hypoxia felt due to exacerbation of underlying COPD as well as superimposed COVID infection. Presumption is that he should improve slowly but steadily to return close to home settings - Difficult to wean O2, still on 15 L HFNC. May need LTACH for further slow weaning due to anticipated prolonged hospitalization  -On eliquis - echo on 2/14 shows EF 60-65%, normal diastology -Per cardiology, started on Lasix 20 mg p.o. every 48 hours; will need intermittent renal function monitoring at discharge as well  Paroxysmal atrial fibrillation with RVR ([place])- (present on admission) - Patient noted to be in A-fib with RVR with heart rate in 140s, started on beta-blocker 25 mg p.o. twice daily -Currently  normal sinus rhythm, on Eliquis - cardiology following as well. Echo on 2/14 shows normal EF, no diastolic dysfunction  COPD (chronic obstructive pulmonary disease) ([place])- (present on admission) -Mild exacerbation in the setting of ongoing COVID infection  --Chest x-ray showed bibasilar left greater than right areas of atelectasis or consolidation.  Procalcitonin <0.1 - appreciate pulmonology assistance   COVID-19 virus infection-resolved as of 02/24/2021 - Reportedly diagnosed approximately 1 week prior to admission but unclear if he has been treated - Given worsening oxygen demands and several comorbidities, completed remdesivir on 2/9  -On p.o. prednisone, Pulmicort - okay to d/c precautions; done on 2/17  History of DVT (deep vein thrombosis) Patient was on Xarelto prior to admission. -Venous Doppler showed a new DVT in the right lower extremity from proximal femoral to the popliteal and in single posterior tibial vein.   Also has bilateral arterial disease with occlusion of the proximal-distal LT SFA and a focal dilitation of the proximal RT SFA to 2.5 cm -Patient has known history of PAD.  Placed on therapeutic eliquis.    Hypokalemia - Replete PRN  Rib fractures - Likely due to recurrent falls after his left AKA - CTA chest shows left 3rd - 5th and left 5th and 9th rib fractures -Continue incentive spirometry   HTN (hypertension) - BP stable  Bipolar 1 disorder (HCC) - continue mirtazapine,  -Flat affect now improved; pulmonology started Lexapro and as needed Atarax  Multiple lung nodules- (present on admission) - Appears to be known from 2020 - CTA chest shows 6 mm left lower lobe, 3 and 5 mm right  middle lobe - appears stable/smaller from prior imagining, continue outpatient monitoring -Will need outpatient pulmonology follow-up   DM2 (diabetes mellitus, type 2) ([place]) - A1c 7.7% on admission - CBGs elevated due to steroids, improving since transition to oral  prednisone. -Continue Semglee and SSI   Hyperlipidemia associated with type 2 diabetes mellitus ([place])- (present on admission) - Continue Lipitor  CAD (coronary artery disease)- (present on admission) -Currently on aspirin, beta-blocker, statin, eliquis    Subjective: No events overnight. Feels essentially the same. Still stuck on ~15 L [place] HF.   Physical Exam: Vitals:   02/28/21 0914 02/28/21 1000 02/28/21 1135 02/28/21 1200  BP: 99/60 108/71  93/62  Pulse: 62 62  60  Resp:  (!) 21  19  Temp:   (!) 96.7 F (35.9 C)   TempSrc:   Axillary   SpO2:  92%  95%  Weight:      Height:      Physical Exam Constitutional:      General: He is not in acute distress.    Comments: Fatigued but no distress  HENT:     Head: Normocephalic and atraumatic.     Mouth/Throat:     Mouth: Mucous membranes are moist.  Eyes:     Extraocular Movements: Extraocular movements intact.  Cardiovascular:     Rate and Rhythm: Normal rate and regular rhythm.     Heart sounds: Normal heart sounds.  Pulmonary:     Effort: Pulmonary effort is normal. No respiratory distress.     Comments: Bilateral coarse breath sounds but overall has improved Abdominal:     General: Bowel sounds are normal. There is no distension.     Palpations: Abdomen is soft.     Tenderness: There is no abdominal tenderness.  Musculoskeletal:        General: Normal range of motion.     Cervical back: Normal range of motion and neck supple.  Skin:    General: Skin is warm and dry.  Neurological:     General: No focal deficit present.  Psychiatric:        Mood and Affect: Mood normal.     Data Reviewed:  I have Reviewed nursing notes, Vitals, and Lab results since pt's last encounter. Pertinent lab results : see below I have ordered test including BMP, CBC, Mg I have reviewed the last note from staff over past 24 hours,  I have discussed pt's care plan and test results with nursing staff, CM. Results for orders placed  or performed during the hospital encounter of 02/12/21 (from the past 24 hour(s))  Glucose, capillary     Status: Abnormal   Collection Time: 02/27/21  4:27 PM  Result Value Ref Range   Glucose-Capillary 129 (H) 70 - 99 mg/dL   Comment 1 Notify RN    Comment 2 Document in Chart   Glucose, capillary     Status: Abnormal   Collection Time: 02/27/21  9:40 PM  Result Value Ref Range   Glucose-Capillary 124 (H) 70 - 99 mg/dL   Comment 1 Notify RN    Comment 2 Document in Chart   Basic metabolic panel     Status: Abnormal   Collection Time: 02/28/21  3:02 AM  Result Value Ref Range   Sodium 137 135 - 145 mmol/L   Potassium 3.7 3.5 - 5.1 mmol/L   Chloride 104 98 - 111 mmol/L   CO2 28 22 - 32 mmol/L   Glucose, Bld 88 70 - 99 mg/dL  BUN 16 8 - 23 mg/dL   Creatinine, Ser 0.65 0.61 - 1.24 mg/dL   Calcium 8.3 (L) 8.9 - 10.3 mg/dL   GFR, Estimated >60 >60 mL/min   Anion gap 5 5 - 15  Magnesium     Status: None   Collection Time: 02/28/21  3:02 AM  Result Value Ref Range   Magnesium 2.2 1.7 - 2.4 mg/dL  Glucose, capillary     Status: None   Collection Time: 02/28/21  8:21 AM  Result Value Ref Range   Glucose-Capillary 93 70 - 99 mg/dL   Comment 1 Notify RN    Comment 2 Document in Chart   Glucose, capillary     Status: None   Collection Time: 02/28/21 11:32 AM  Result Value Ref Range   Glucose-Capillary 96 70 - 99 mg/dL   Comment 1 Notify RN    Comment 2 Document in Chart      Family Communication:   Disposition: Status is: Inpatient Remains inpatient appropriate because: Treatment as outlined in A&P    Planned Discharge Destination:  Denied by LTAC; will have to return to [facility] once O2 weaned further  DVT PPX: apixaban [place])    Author: Dwyane Dee, MD 02/28/2021 12:53 PM  For on call review [email or web address].

## 2021-02-28 NOTE — Progress Notes (Signed)
Occupational Therapy Treatment Patient Details Name: Anthony [name]. MRN: [id number] DOB: [birth year] Today's Date: 02/28/2021   History of present illness Patient is a 71 year old male who resides at [facility] place and admitted for acute respiratory failure 2* COVID. PMH includes L AKA, hx falls   OT comments  Patient semi-supine in bed upon arrival with O2 not inside nostrils sats reading 88-89%. Donned 15L O2 and quickly recover to 95-96%. Patient needing min A for trunk support to sit upright. Mod A to power up to standing and steady with walker. Patient fatigues quickly needing mod A to hop to recliner. O2 accidentally doffed during transfer with desat to 81% on room air. Patient able to recover to 90-91% on 12L however increased to 15L at end of session and notified RN.    Recommendations for follow up therapy are one component of a multi-disciplinary discharge planning process, led by the attending physician.  Recommendations may be updated based on patient status, additional functional criteria and insurance authorization.    Follow Up Recommendations  Skilled nursing-short term rehab (<3 hours/day) (insurance declined LTACH)    Assistance Recommended at Discharge Frequent or constant Supervision/Assistance  Patient can return home with the following  A lot of help with walking and/or transfers;A lot of help with bathing/dressing/bathroom;Assistance with cooking/housework;Direct supervision/assist for medications management;Direct supervision/assist for financial management;Assist for transportation;Help with stairs or ramp for entrance   Equipment Recommendations  None recommended by OT       Precautions / Restrictions Precautions Precautions: Fall Precaution Comments: multiple B rib fractures, Lt AKA, on HFNC 15 L Restrictions Weight Bearing Restrictions: No       Mobility Bed Mobility Overal bed mobility: Needs Assistance Bed Mobility: Supine to Sit     Supine to sit:  Min assist     General bed mobility comments: Min A to upright trunk       Balance Overall balance assessment: History of Falls, Needs assistance Sitting-balance support: Feet supported Sitting balance-Leahy Scale: Fair     Standing balance support: Reliant on assistive device for balance Standing balance-Leahy Scale: Poor                             ADL either performed or assessed with clinical judgement   ADL Overall ADL's : Needs assistance/impaired     Grooming: Wash/dry face;Set up;Sitting                   Toilet Transfer: Moderate assistance;Stand-pivot;Cueing for safety;Cueing for sequencing;Rolling walker (2 wheels) Toilet Transfer Details (indicate cue type and reason): Patient needing mod  A +1 to power up to standing and stabilize. Able to hop to recliner chair, fatigues quickly.         Functional mobility during ADLs: Moderate assistance;Cueing for safety;Cueing for sequencing;Rolling walker (2 wheels) General ADL Comments: O2 accidentally dropped off during transfer. Patient desat to 81% on room air. Donned once in chair patient recover to 90-91% on 12L however increased back to 15L and notified RN      Cognition Arousal/Alertness: Awake/alert Behavior During Therapy: Flat affect Overall Cognitive Status: No family/caregiver present to determine baseline cognitive functioning                                 General Comments: oriented to month only  Pertinent Vitals/ Pain       Pain Assessment Pain Assessment: Faces Faces Pain Scale: No hurt         Frequency  Min 2X/week        Progress Toward Goals  OT Goals(current goals can now be found in the care plan section)  Progress towards OT goals: Progressing toward goals  Acute Rehab OT Goals Patient Stated Goal: agreeable to sit in chair OT Goal Formulation: With patient Time For Goal Achievement: 03/14/21 Potential to Achieve  Goals: Good ADL Goals Pt Will Transfer to Toilet: with supervision;stand pivot transfer;bedside commode Additional ADL Goal #1: Patient will tolerate 15 minutes of out of bed activity in order to participate in daily self care. Additional ADL Goal #2: Patient will implement pursed lip breathing strategies with min cues in order to maintain O2 saturations during self care activities.  Plan Discharge plan needs to be updated       AM-PAC OT "6 Clicks" Daily Activity     Outcome Measure   Help from another person eating meals?: None Help from another person taking care of personal grooming?: A Little Help from another person toileting, which includes using toliet, bedpan, or urinal?: A Lot Help from another person bathing (including washing, rinsing, drying)?: A Lot Help from another person to put on and taking off regular upper body clothing?: A Little Help from another person to put on and taking off regular lower body clothing?: A Lot 6 Click Score: 16    End of Session Equipment Utilized During Treatment: Gait belt;Rolling walker (2 wheels);Oxygen  OT Visit Diagnosis: Other abnormalities of gait and mobility (R26.89);Muscle weakness (generalized) (M62.81);History of falling (Z91.81)   Activity Tolerance Patient tolerated treatment well   Patient Left in chair;with call bell/phone within reach;with chair alarm set   Nurse Communication Mobility status        Time: 1000-1020 OT Time Calculation (min): 20 min  Charges: OT General Charges $OT Visit: 1 Visit OT Treatments $Self Care/Home Management : 8-22 mins  Delbert Phenix OT OT pager: [phone]   Rosemary Holms 02/28/2021, 2:01 PM

## 2021-03-01 DIAGNOSIS — E876 Hypokalemia: Secondary | ICD-10-CM

## 2021-03-01 LAB — GLUCOSE, CAPILLARY
Glucose-Capillary: 117 mg/dL — ABNORMAL HIGH (ref 70–99)
Glucose-Capillary: 153 mg/dL — ABNORMAL HIGH (ref 70–99)
Glucose-Capillary: 171 mg/dL — ABNORMAL HIGH (ref 70–99)
Glucose-Capillary: 172 mg/dL — ABNORMAL HIGH (ref 70–99)
Glucose-Capillary: 183 mg/dL — ABNORMAL HIGH (ref 70–99)
Glucose-Capillary: 204 mg/dL — ABNORMAL HIGH (ref 70–99)
Glucose-Capillary: 64 mg/dL — ABNORMAL LOW (ref 70–99)

## 2021-03-01 LAB — BASIC METABOLIC PANEL
Anion gap: 6 (ref 5–15)
BUN: 20 mg/dL (ref 8–23)
CO2: 29 mmol/L (ref 22–32)
Calcium: 8.3 mg/dL — ABNORMAL LOW (ref 8.9–10.3)
Chloride: 101 mmol/L (ref 98–111)
Creatinine, Ser: 0.73 mg/dL (ref 0.61–1.24)
GFR, Estimated: >60 mL/min (ref >60)
Glucose, Bld: 161 mg/dL — ABNORMAL HIGH (ref 70–99)
Potassium: 3.6 mmol/L (ref 3.5–5.1)
Sodium: 136 mmol/L (ref 135–145)

## 2021-03-01 LAB — MAGNESIUM: Magnesium: 2.1 mg/dL (ref 1.7–2.4)

## 2021-03-01 MED ORDER — POTASSIUM CHLORIDE CRYS ER 20 MEQ PO TBCR
20.0000 meq | EXTENDED_RELEASE_TABLET | Freq: Two times a day (BID) | ORAL | Status: AC
  Administered 2021-03-01 (×2): 20 meq via ORAL
  Filled 2021-03-01 (×2): qty 1

## 2021-03-01 MED ORDER — INSULIN ASPART 100 UNIT/ML IJ SOLN
8.0000 [IU] | Freq: Three times a day (TID) | INTRAMUSCULAR | Status: DC
  Administered 2021-03-01 – 2021-03-03 (×5): 8 [IU] via SUBCUTANEOUS

## 2021-03-01 MED ORDER — INSULIN GLARGINE-YFGN 100 UNIT/ML ~~LOC~~ SOLN
15.0000 [IU] | Freq: Two times a day (BID) | SUBCUTANEOUS | Status: DC
  Administered 2021-03-01 – 2021-03-03 (×5): 15 [IU] via SUBCUTANEOUS
  Filled 2021-03-01 (×6): qty 0.15

## 2021-03-01 NOTE — Progress Notes (Signed)
Pt transferred to 4W. Pt stable at this time. Report called.

## 2021-03-01 NOTE — Progress Notes (Signed)
End of shift report  Received report from [facility] in the ICU.  Pt arrived on the floor this afternoon on 10L high flow.  Transitioned pt to [place] and is at 2 L.  Pt does not were oxygen at baseline.  Pt A &Ox4.  Pt slept this afternoon, after arrival.

## 2021-03-01 NOTE — Plan of Care (Signed)

## 2021-03-01 NOTE — Progress Notes (Signed)
[facility] PROGRESS NOTE    Progress Note  Anthony [name].  ZOX:[id number] DOB: [birth year] DOA: 02/12/2021 PCP: [name], DO     Brief Narrative:   Anthony [name]. is an 71 y.o. 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  The patient elected to be a DNI, PCCM was consulted on February 18, 2020 for acute respiratory failure with hypoxia    Assessment/Plan:   Acute respiratory failure with hypoxia [facility]): CTA of the chest showed bilateral paraseptal emphysema with chronic ILD and multiple pulmonary nodules no PE. Etiology of his life [place] felt to be due to underlying COPD as well as superimposed COVID-19 infection PCCM was consulted along with cardiology they have both sign off Currently on Eliquis, 2 -D echo EF 60% with no appreciated diastolic dysfunction. Cardiology was consulted and he was started on Lasix every 48 hours. He is currently requiring 10 L of oxygen to keep saturations greater than 88%. He has been denied from [facility] after several appeals. We will need to wean him down on the oxygen enough to get him back to skilled nursing facility. Out of bed to chair recommend incentive spirometry. Transfer to progressive care unit. Traction saturations greater than 6%  Paroxysmal atrial fibrillation with RVR: Patient was noted to be in a  patient with RVR, started on metoprolol and diltiazem. He is currently on Eliquis, his chads Vascor is greater than 2. He is currently in sinus rhythm, he is not a candidate for amiodarone symptoms lung disease. 2D echo showed preserved ejection fraction no diastolic dysfunction.  COPD: Question mild exacerbation Pro-Cal is less than 0.1. Appreciate pulmonary assistance.  COVID-19 viral infection resolved in 02/24/2021: Precautions DC on 02/24/2021. He has completed his course of remdesivir and steroids.  Continue Pulmicort  History of DVT: Patient was on Xarelto prior to admission Doppler showed new DVT on the right. He has been transitioned to oral Eliquis.  Hypokalemia: Repleted orally now resolved.  Multiple rib fractures: Likely due to recurrent falls in the setting of left AKA. Continue incentive spirometry.  Essential hypertension: Stable.  Bipolar disorder: Continue Remeron, he was started on Lexapro continue Atarax as needed.  Multiple pulmonary nodules: Seen on previous imaging in 2020. There is a 6 mm left lower lobe which are smaller compared to previous imaging, will need follow-up with pulmonary and critical care as an outpatient.  Diabetes mellitus type 2: With an A1c of 7.7, change long-acting insulin to twice a day and increase, will increase meal coverage. Blood glucose is improved since he was weaned off steroids   DVT prophylaxis: eliquis Family Communication:none Status is: Inpatient Remains inpatient appropriate because: Continues to have hight oxygen req.     Code Status:     Code Status Orders  (From admission, onward)           Start     Ordered   02/12/21 1923  Limited resuscitation (code)  Continuous       Question Answer Comment  In the event of cardiac or respiratory ARREST: Initiate Code Blue, Call Rapid Response Yes   In the event of cardiac or respiratory ARREST: Perform CPR Yes   In the event of cardiac or respiratory  ARREST: Perform Intubation/Mechanical Ventilation No   In the event of cardiac or respiratory ARREST: Use NIPPV/BiPAp only if indicated Yes   In the event of cardiac or respiratory ARREST: Administer ACLS medications if indicated Yes   In the event of cardiac or respiratory ARREST: Perform Defibrillation or Cardioversion if indicated Yes   Comments DNI confirmed w/ daughter      02/12/21 1922           Code Status History     Date Active Date Inactive Code Status Order ID Comments User Context   07/26/2014 1622 07/27/2014 2132 Full Code [id number]  Greer Pickerel, MD Inpatient   10/30/2013 1111 10/31/2013 1354 Full Code [id number]  Gayland Curry, MD Inpatient   12/16/2012 1602 12/22/2012 1731 Full Code [id number]  Clarene Reamer, MD Inpatient   12/13/2012 2312 12/16/2012 1602 Full Code [id number]  Mirna Mires, MD ED   04/29/2012 2051 04/30/2012 0014 Full Code [id number]  Derrill Kay, MD Inpatient         IV Access:   Peripheral IV   Procedures and diagnostic studies:   No results found.   Medical Consultants:   None.   Subjective:    Anthony [name]. he relates his breathing is about the same as yesterday.  Objective:    Vitals:   03/01/21 0200 03/01/21 0300 03/01/21 0400 03/01/21 0500  BP: (!) 114/57 109/74 111/69 107/65  Pulse: 64 (!) 59 60 77  Resp: (!) 21 18 16 16   Temp:  97.9 F (36.6 C)    TempSrc:  Oral    SpO2: 94% 93% 93% (!) 86%  Weight:      Height:       SpO2: (!) 86 % O2 Flow Rate (L/min): 10 L/min FiO2 (%): 100 %   Intake/Output Summary (Last 24 hours) at 03/01/2021 0804 Last data filed at 03/01/2021 0445 Gross per 24 hour  Intake --  Output 1775 ml  Net -1775 ml   Filed Weights   02/12/21 1823 02/27/21 1400  Weight: 87.2 kg 83.2 kg    Exam: General exam: In no acute distress. Respiratory system: poor air movement with crackles bilaterally Cardiovascular system: S1 & S2 heard, RRR. No JVD.  Gastrointestinal system: Abdomen is  nondistended, soft and nontender.  Extremities: No pedal edema. Skin: No rashes, lesions or ulcers Psychiatry: Judgement and insight appear normal. Mood & affect appropriate.    Data Reviewed:    Labs: Basic Metabolic Panel: Recent Labs  Lab 02/25/21 0318 02/26/21 0256 02/27/21 0246 02/28/21 0302 03/01/21 0309  NA 140 138 137 137 136  K 3.8 4.0 3.5 3.7 3.6  CL 102 101 100 104 101  CO2 30 32 30 28 29   GLUCOSE 79 79 169* 88 161*  BUN 27* 27* 21 16 20   CREATININE 0.78 0.76 0.78 0.65 0.73  CALCIUM 8.7* 8.4* 8.3* 8.3* 8.3*  MG 2.4 2.2 2.1 2.2 2.1   GFR Estimated Creatinine Clearance: 83.5 mL/min (by C-G formula based on SCr of 0.73 mg/dL). Liver Function Tests: No results for input(s): AST, ALT, ALKPHOS, BILITOT, PROT, ALBUMIN in the last 168 hours. No results for input(s): LIPASE, AMYLASE in the last 168 hours. No results for  input(s): AMMONIA in the last 168 hours. Coagulation profile No results for input(s): INR, PROTIME in the last 168 hours. COVID-19 Labs  No results for input(s): DDIMER, FERRITIN, LDH, CRP in the last 72 hours.  Lab Results  Component Value Date   SARSCOV2NAA POSITIVE (A) 02/12/2021    CBC: Recent Labs  Lab 02/23/21 0301 02/24/21 0257 02/25/21 0318 02/26/21 0256 02/27/21 0246  WBC 20.6* 20.0* 16.0* 12.5* 11.7*  NEUTROABS 16.6* 16.3* 12.4* 8.9* 8.6*  HGB 17.5* 16.9 16.2 16.0 15.3  HCT 53.8* 52.2* 51.0 50.3 46.4  MCV 90.1 90.6 91.6 92.0 89.7  PLT 238 232 219 204 188   Cardiac Enzymes: No results for input(s): CKTOTAL, CKMB, CKMBINDEX, TROPONINI in the last 168 hours. BNP (last 3 results) No results for input(s): PROBNP in the last 8760 hours. CBG: Recent Labs  Lab 02/28/21 1132 02/28/21 1524 02/28/21 2225 03/01/21 0017 03/01/21 0309  GLUCAP 96 134* 92 153* 172*   D-Dimer: No results for input(s): DDIMER in the last 72 hours. Hgb A1c: No results for input(s): HGBA1C in the last 72 hours. Lipid Profile: No results for  input(s): CHOL, HDL, LDLCALC, TRIG, CHOLHDL, LDLDIRECT in the last 72 hours. Thyroid function studies: No results for input(s): TSH, T4TOTAL, T3FREE, THYROIDAB in the last 72 hours.  Invalid input(s): FREET3 Anemia work up: No results for input(s): VITAMINB12, FOLATE, FERRITIN, TIBC, IRON, RETICCTPCT in the last 72 hours. Sepsis Labs: Recent Labs  Lab 02/24/21 0257 02/25/21 0318 02/26/21 0256 02/27/21 0246  WBC 20.0* 16.0* 12.5* 11.7*   Microbiology No results found for this or any previous visit (from the past 240 hour(s)).   Medications:    apixaban  5 mg Oral BID   vitamin C  500 mg Oral Daily   aspirin EC  81 mg Oral Daily   atorvastatin  40 mg Oral Daily   benzonatate  200 mg Oral TID   budesonide (PULMICORT) nebulizer solution  0.5 mg Nebulization BID   Chlorhexidine Gluconate Cloth  6 each Topical Q0600   diltiazem  120 mg Oral Daily   escitalopram  10 mg Oral QHS   feeding supplement (GLUCERNA SHAKE)  237 mL Oral BID BM   furosemide  20 mg Oral Q48H   gabapentin  100 mg Oral BID   guaiFENesin  1,200 mg Oral BID   insulin aspart  0-20 Units Subcutaneous TID WC   insulin aspart  0-5 Units Subcutaneous QHS   insulin aspart  10 Units Subcutaneous TID WC   insulin glargine-yfgn  26 Units Subcutaneous QHS   ipratropium-albuterol  3 mL Nebulization TID   mouth rinse  15 mL Mouth Rinse BID   metoprolol tartrate  25 mg Oral BID   mirtazapine  30 mg Oral QHS   multivitamin with minerals  1 tablet Oral Daily   Ensure Max Protein  11 oz Oral Daily   zinc sulfate  220 mg Oral Daily   Continuous Infusions:  sodium chloride Stopped (02/17/21 0854)      LOS: 17 days   Anthony [name]  [facility]  03/01/2021, 8:04 AM

## 2021-03-02 DIAGNOSIS — E785 Hyperlipidemia, unspecified: Secondary | ICD-10-CM

## 2021-03-02 DIAGNOSIS — E1169 Type 2 diabetes mellitus with other specified complication: Secondary | ICD-10-CM

## 2021-03-02 LAB — GLUCOSE, CAPILLARY
Glucose-Capillary: 134 mg/dL — ABNORMAL HIGH (ref 70–99)
Glucose-Capillary: 286 mg/dL — ABNORMAL HIGH (ref 70–99)
Glucose-Capillary: 85 mg/dL (ref 70–99)
Glucose-Capillary: 91 mg/dL (ref 70–99)

## 2021-03-02 NOTE — Plan of Care (Signed)

## 2021-03-02 NOTE — NC FL2 (Signed)
[place] LEVEL OF CARE SCREENING TOOL     IDENTIFICATION  Patient Name: Anthony [name]. Birthdate: November 11, 1950 Sex: male Admission Date (Current Location): 02/12/2021  [facility] and [place] Number:  Herbalist and Address:  [facility],  [address] [place], [place]      Provider Number: [id number]  Attending Physician Name and Address:  Aileen Fass, Tammi Klippel, MD  Relative Name and Phone Number:  Hill,[name] Daughter [phone], Hill,Christian Yolanda Bonine   [phone]    Current Level of Care: Hospital Recommended Level of Care: [place] Prior Approval Number:    Date Approved/Denied:   PASRR Number: [id number]  Discharge Plan: SNF    Current Diagnoses: Patient Active Problem List   Diagnosis Date Noted   Paroxysmal atrial fibrillation with RVR ([place]) 02/21/2021   Pressure injury of skin 02/13/2021   Rib fractures 02/13/2021   History of DVT (deep vein thrombosis) 02/13/2021   HTN (hypertension) 02/13/2021   Acute respiratory failure with hypoxia ([place]) 02/12/2021   Hypokalemia 02/12/2021   Bipolar 1 disorder ([place]) 02/12/2021   Right flank pain 11/01/2019   Fall against object 09/25/2019   Cerebellar ataxia in diseases classified elsewhere ([place]) 08/26/2019   Chronic pain of right knee 07/14/2019   Chronic constipation 03/24/2019   Spinal stenosis of lumbar region with neurogenic claudication 11/14/2018   Vitamin D deficiency 09/05/2018   Elevated vitamin B12 level 09/05/2018   Acute neutrophilia 09/05/2018   Chronic fatigue 08/29/2018   Generalized weakness 08/29/2018   Multiple lung nodules 08/03/2018   Insomnia 03/26/2017   Aortic atherosclerosis ([place]) 10/19/2016   Pain medication agreement signed 06/05/2016   DM2 (diabetes mellitus, type 2) ([place]) 05/25/2016   Obesity (BMI 30-39.9) 04/13/2016   Rectal bleeding 11/23/2015   Moderate episode of recurrent major depressive disorder ([place]) 10/18/2015    S/P right THA, AA 05/10/2015   Recurrent left inguinal hernia 07/26/2014   Chronic right hip pain 06/23/2014   S/P bilateral inguinal hernia repair 10/30/2013   Bilateral inguinal hernia 73/71/0626   Umbilical hernia 94/85/4627   Mood disorder ([place])    CAD (coronary artery disease)    Hyperlipidemia associated with type 2 diabetes mellitus (HCC)    COPD (chronic obstructive pulmonary disease) (HCC)    Tobacco abuse     Orientation RESPIRATION BLADDER Height & Weight     Self, Time, Situation, Place  O2 Continent Weight: 83.2 kg Height:  5\' 9"  (175.3 cm)  BEHAVIORAL SYMPTOMS/MOOD NEUROLOGICAL BOWEL NUTRITION STATUS      Continent Diet (Carb modified)  AMBULATORY STATUS COMMUNICATION OF NEEDS Skin   Extensive Assist Verbally Other (Comment), PU Stage and Appropriate Care (left AKA,  Stage one on buttock foam dressing) PU Stage 1 Dressing:  (As needed)                     Personal Care Assistance Level of Assistance  Bathing, Feeding, Dressing Bathing Assistance: Maximum assistance Feeding assistance: Limited assistance Dressing Assistance: Maximum assistance     Functional Limitations Info  Sight, Hearing, Speech Sight Info: Impaired Hearing Info: Adequate Speech Info: Adequate    SPECIAL CARE FACTORS FREQUENCY  PT (By licensed PT), OT (By licensed OT)     PT Frequency: x5 week OT Frequency: x5 week            Contractures Contractures Info: Not present    Additional Factors Info  Code Status, Allergies, Insulin Sliding Scale Code Status Info: FULL Allergies Info:  Flomax (Tamsulosin Hcl), Penicillins   Insulin Sliding Scale Info: See discharge summary       Current Medications (03/02/2021):  This is the current hospital active medication list Current Facility-Administered Medications  Medication Dose Route Frequency Provider Last Rate Last Admin   0.9 %  sodium chloride infusion   Intravenous PRN Dwyane Dee, MD   Stopped at 02/17/21 0854    acetaminophen (TYLENOL) tablet 650 mg  650 mg Oral Q6H PRN Dwyane Dee, MD   650 mg at 02/23/21 2426   Or   acetaminophen (TYLENOL) suppository 650 mg  650 mg Rectal Q6H PRN Dwyane Dee, MD       apixaban Arne [place]) tablet 5 mg  5 mg Oral BID Dwyane Dee, MD   5 mg at 03/02/21 1118   ascorbic acid (VITAMIN C) tablet 500 mg  500 mg Oral Daily Dwyane Dee, MD   500 mg at 03/02/21 1118   aspirin EC tablet 81 mg  81 mg Oral Daily Dwyane Dee, MD   81 mg at 03/02/21 1118   atorvastatin (LIPITOR) tablet 40 mg  40 mg Oral Daily Dwyane Dee, MD   40 mg at 03/02/21 1118   benzonatate (TESSALON) capsule 200 mg  200 mg Oral TID Dwyane Dee, MD   200 mg at 03/02/21 1117   budesonide (PULMICORT) nebulizer solution 0.5 mg  0.5 mg Nebulization BID Dwyane Dee, MD   0.5 mg at 03/02/21 8341   Chlorhexidine Gluconate Cloth 2 % PADS 6 each  6 each Topical Q0600 Dwyane Dee, MD   6 each at 02/28/21 2249   chlorpheniramine-HYDROcodone 10-8 MG/5ML suspension 5 mL  5 mL Oral Q12H PRN Dwyane Dee, MD   5 mL at 02/25/21 1313   diltiazem (CARDIZEM CD) 24 hr capsule 120 mg  120 mg Oral Daily Dwyane Dee, MD   120 mg at 03/02/21 1117   escitalopram (LEXAPRO) tablet 10 mg  10 mg Oral Standley Brooking, MD   10 mg at 03/01/21 2134   feeding supplement (GLUCERNA SHAKE) (GLUCERNA SHAKE) liquid 237 mL  237 mL Oral BID BM Dwyane Dee, MD   237 mL at 03/02/21 1120   furosemide (LASIX) tablet 20 mg  20 mg Oral Q48H Dwyane Dee, MD   20 mg at 03/02/21 1118   gabapentin (NEURONTIN) capsule 100 mg  100 mg Oral BID Dwyane Dee, MD   100 mg at 03/02/21 1118   guaiFENesin (MUCINEX) 12 hr tablet 1,200 mg  1,200 mg Oral BID Dwyane Dee, MD   1,200 mg at 03/02/21 1117   hydrOXYzine (ATARAX) tablet 25 mg  25 mg Oral TID PRN Dwyane Dee, MD   25 mg at 03/01/21 2134   insulin aspart (novoLOG) injection 0-20 Units  0-20 Units Subcutaneous TID WC Dwyane Dee, MD   11 Units at 03/02/21 1203    insulin aspart (novoLOG) injection 0-5 Units  0-5 Units Subcutaneous QHS Dwyane Dee, MD   2 Units at 02/24/21 2204   insulin aspart (novoLOG) injection 8 Units  8 Units Subcutaneous TID WC Charlynne Cousins, MD   8 Units at 03/02/21 1202   insulin glargine-yfgn (SEMGLEE) injection 15 Units  15 Units Subcutaneous BID Charlynne Cousins, MD   15 Units at 03/02/21 1204   ipratropium-albuterol (DUONEB) 0.5-2.5 (3) MG/3ML nebulizer solution 3 mL  3 mL Nebulization TID Dwyane Dee, MD   3 mL at 03/02/21 1342   MEDLINE mouth rinse  15 mL Mouth Rinse BID Dwyane Dee, MD  15 mL at 03/02/21 1204   metoprolol tartrate (LOPRESSOR) tablet 25 mg  25 mg Oral BID Dwyane Dee, MD   25 mg at 03/02/21 1118   mirtazapine (REMERON) tablet 30 mg  30 mg Oral Standley Brooking, MD   30 mg at 03/01/21 2133   multivitamin with minerals tablet 1 tablet  1 tablet Oral Daily Dwyane Dee, MD   1 tablet at 03/02/21 1118   protein supplement (ENSURE MAX) liquid  11 oz Oral Daily Dwyane Dee, MD   11 oz at 02/28/21 1418   saline (AYR) nasal gel with aloe 1 application  1 application Each Nare PRN Dwyane Dee, MD   1 application at 37/35/78 1249   sodium chloride (OCEAN) 0.65 % nasal spray 1 spray  1 spray Each Nare PRN Dwyane Dee, MD   1 spray at 02/24/21 1249   zinc sulfate capsule 220 mg  220 mg Oral Daily Dwyane Dee, MD   220 mg at 03/02/21 1118     Discharge Medications: Please see discharge summary for a list of discharge medications.  Relevant Imaging Results:  Relevant Lab Results:   Additional Information [phone]  Purcell Mouton, RN

## 2021-03-02 NOTE — TOC Progression Note (Signed)
Transition of Care (TOC) - Progression Note    Patient Details  Name: Anthony [name]. MRN: [id number] Date of Birth: [birth year]  Transition of Care [facility]) CM/SW Contact  Purcell Mouton, RN Phone Number: 03/02/2021, 4:16 PM  Clinical Narrative:    [facility] was called for pt's return. No answer. FL2 was faxed to [facility].   Expected Discharge Plan: [place] Barriers to Discharge: Continued Medical Work up  Expected Discharge Plan and Services Expected Discharge Plan: [place] In-house Referral: Clinical Social Work   Post Acute Care Choice: [place] Living arrangements for the past 2 months: [place]                 DME Arranged: N/A DME Agency: NA                   Social Determinants of Health (SDOH) Interventions    Readmission Risk Interventions Readmission Risk Prevention Plan 02/13/2021  Transportation Screening Complete  PCP or Specialist Appt within 5-7 Days Complete  Home Care Screening Complete  Medication Review (RN CM) Complete  Some recent data might be hidden

## 2021-03-02 NOTE — Progress Notes (Signed)
Physical Therapy Treatment Patient Details Name: Anthony [name]. MRN: [id number] DOB: [birth year] Today's Date: 03/02/2021   History of Present Illness Patient is a 71 year old male who resides at [facility] place and admitted for acute respiratory failure 2* COVID. PMH includes L AKA, hx falls    PT Comments    Pt is progressing toward acute PT goals with ambulation ~21ft. Pt able to perform hopping steps forward/backward with MIN A and use of RW , limited with further distance due to R knee pain. Able to perform LE there ex following limited ambulation. Pt on 4L upon entry with O2 sat reading 100%, weaned to 3L desat to 85% with mobility, able to recover to >90% with increased time and seated rest while on 3L, cues for deep breathing techniques. Pt left on 3L [place] at end of session with O2 sat 94%, RN aware.Pt will benefit from continued skilled PT to increase their independence and maximize safety with mobility.    Recommendations for follow up therapy are one component of a multi-disciplinary discharge planning process, led by the attending physician.  Recommendations may be updated based on patient status, additional functional criteria and insurance authorization.  Follow Up Recommendations  Skilled nursing-short term rehab (<3 hours/day) ((Per EMR, denied for [facility]))     Assistance Recommended at Discharge Frequent or constant Supervision/Assistance  Patient can return home with the following Direct supervision/assist for medications management;Assist for transportation;Direct supervision/assist for financial management;Help with stairs or ramp for entrance;A little help with walking and/or transfers;A little help with bathing/dressing/bathroom   Equipment Recommendations  None recommended by PT    Recommendations for Other Services       Precautions / Restrictions Precautions Precautions: Fall Precaution Comments: multiple B rib fractures, Lt AKA, on HFNC 15 L Restrictions Weight  Bearing Restrictions: No     Mobility  Bed Mobility Overal bed mobility: Needs Assistance Bed Mobility: Supine to Sit     Supine to sit: Supervision, HOB elevated Sit to supine: Supervision   General bed mobility comments: HOB elevated 25deg    Transfers Overall transfer level: Needs assistance Equipment used: Rolling walker (2 wheels) Transfers: Sit to/from Stand Sit to Stand: Min assist, From elevated surface           General transfer comment: MIN assist for power up to rise, steadying, STS x2, from EOB. Cues provided for hand placement with 2nd stand, pt requiring multiple attempts to rise on 2nd trial. Able to perform lateral hopping along EOB with MIN A to L/R    Ambulation/Gait Ambulation/Gait assistance: Min assist Gait Distance (Feet): 6 Feet Assistive device: Rolling walker (2 wheels) Gait Pattern/deviations: Antalgic Gait velocity: decr     General Gait Details: increased R knee pain with hopping. MIN A for stability, pt limited with further distance due to pain.   Stairs             Wheelchair Mobility    Modified Rankin (Stroke Patients Only)       Balance Overall balance assessment: History of Falls, Needs assistance Sitting-balance support: Feet supported Sitting balance-Leahy Scale: Fair     Standing balance support: Reliant on assistive device for balance Standing balance-Leahy Scale: Poor                              Cognition Arousal/Alertness: Awake/alert Behavior During Therapy: Flat affect Overall Cognitive Status: No family/caregiver present to determine baseline cognitive functioning  General Comments: Pt unable to tell therapist how he mobilized prior to coming to hospital, able to recall he transfers to chair but unable to state whether he has w/c or had been using for mobility. Reports he was to be getting prosthesis but has not had any follow up lately (reports  last was sometime this year)        Exercises General Exercises - Lower Extremity Long Arc Quad: AROM, Right, 10 reps, Seated Hip ABduction/ADduction: AROM, Right, 10 reps, Seated Straight Leg Raises: AROM, Right, 10 reps, Seated Hip Flexion/Marching: AROM, Right, 10 reps, Seated Heel Raises: AROM, Right, 10 reps, Seated    General Comments General comments (skin integrity, edema, etc.): Pt on 4L upon entry with P2 sat reading 100%, weaned to 3L desat to 85% with mobility, able to recover to >90% with increased time and seated rest, cues for deep breathing techniques. Pt left on 3L [place] at end of session with O2 sat 94%, RN aware.      Pertinent Vitals/Pain Pain Assessment Pain Assessment: 0-10 Pain Score: 4  Pain Location: Rt knee with OOB mobility Pain Descriptors / Indicators: Sharp Pain Intervention(s): Limited activity within patient's tolerance, Monitored during session    Home Living                          Prior Function            PT Goals (current goals can now be found in the care plan section) Acute Rehab PT Goals Patient Stated Goal: get prosthetic and rehab to bemore mobile PT Goal Formulation: With patient Time For Goal Achievement: 03/02/21 Potential to Achieve Goals: Fair Progress towards PT goals: Progressing toward goals    Frequency    Min 2X/week      PT Plan Current plan remains appropriate    Co-evaluation              AM-PAC PT "6 Clicks" Mobility   Outcome Measure  Help needed turning from your back to your side while in a flat bed without using bedrails?: A Little Help needed moving from lying on your back to sitting on the side of a flat bed without using bedrails?: A Little Help needed moving to and from a bed to a chair (including a wheelchair)?: A Little Help needed standing up from a chair using your arms (e.g., wheelchair or bedside chair)?: A Little Help needed to walk in hospital room?: A Little Help needed  climbing 3-5 steps with a railing? : Total 6 Click Score: 16    End of Session Equipment Utilized During Treatment: Gait belt Activity Tolerance: Patient tolerated treatment well Patient left: in bed;with call bell/phone within reach;with bed alarm set Nurse Communication: Mobility status PT Visit Diagnosis: Unsteadiness on feet (R26.81);Other abnormalities of gait and mobility (R26.89);Muscle weakness (generalized) (M62.81);Difficulty in walking, not elsewhere classified (R26.2)     Time: 4696-2952 PT Time Calculation (min) (ACUTE ONLY): 32 min  Charges:  $Therapeutic Exercise: 8-22 mins $Therapeutic Activity: 8-22 mins                     Festus Barren PT, DPT  Acute Rehabilitation Services  Office [phone]  03/02/2021, 4:41 PM

## 2021-03-02 NOTE — Progress Notes (Signed)
[facility] PROGRESS NOTE    Progress Note  Anthony [name].  VOZ:[id number] DOB: [birth year] DOA: 02/12/2021 PCP: [name], DO     Brief Narrative:   Anthony Wimberly. is an 71 y.o. 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  The patient elected to be a DNI, PCCM was consulted on February 18, 2020 for acute respiratory failure with hypoxia. He has been denied from [facility] after several appeals.   Assessment/Plan:   Acute respiratory failure with hypoxia [facility]): Etiology of his respiratory failure felt to be due to underlying COPD as well as superimposed COVID-19 infection. Lasix every 48 hours. Currently requiring 4 L of oxygen to keep saturations greater than 96%. Out of bed to chair recommend incentive spirometry.  Paroxysmal atrial fibrillation with RVR: He is currently on Eliquis, his chads Vascor is greater than 2. He is currently in sinus rhythm, he is not a candidate for amiodarone symptoms lung disease.   COPD: Question mild exacerbation procalcitonin less than 0.1 Appreciate pulmonary assistance.  COVID-19 viral infection resolved in 02/24/2021: Precautions DC on 02/24/2021. He has completed his course of remdesivir and steroids.  Continue Pulmicort  History of DVT: Patient was on Xarelto prior to admission Doppler showed new DVT on the  right. He has been transitioned to oral Eliquis.  Hypokalemia: Repleted orally now resolved.  Multiple rib fractures: Likely due to recurrent falls in the setting of left AKA. Continue incentive spirometry.  Essential hypertension: Stable.  Bipolar disorder: Continue Remeron, he was started on Lexapro continue Atarax as needed.  Multiple pulmonary nodules: Seen on previous imaging in 2020. There is a 6 mm left lower lobe which are smaller compared to previous imaging, will need follow-up with pulmonary and critical care as an outpatient.  Diabetes mellitus type 2: With an A1c of 7.7, continue long-acting insulin plus sliding scale at current dose.   DVT prophylaxis: eliquis Family Communication:none Status is: Inpatient Remains inpatient appropriate because: Continues to have hight oxygen req.     Code Status:     Code Status Orders  (From admission, onward)           Start     Ordered   02/12/21 1923  Limited resuscitation (code)  Continuous       Question Answer Comment  In the event of cardiac or respiratory ARREST: Initiate Code Blue, Call Rapid Response Yes   In the event of cardiac or respiratory ARREST: Perform CPR Yes   In the event of cardiac or respiratory ARREST: Perform Intubation/Mechanical Ventilation No   In the event of cardiac or respiratory ARREST: Use NIPPV/BiPAp only if indicated Yes   In the event of cardiac or respiratory ARREST: Administer ACLS medications if indicated Yes   In the event of cardiac or respiratory ARREST: Perform Defibrillation or Cardioversion if indicated Yes   Comments DNI confirmed w/ daughter      02/12/21 1922  Code Status History     Date Active Date Inactive Code Status Order ID Comments User Context   07/26/2014 1622 07/27/2014 2132 Full Code [id number]  Greer Pickerel, MD Inpatient   10/30/2013 1111 10/31/2013 1354 Full Code [id number]  Gayland Curry, MD Inpatient   12/16/2012 1602 12/22/2012 1731  Full Code [id number]  Clarene Reamer, MD Inpatient   12/13/2012 2312 12/16/2012 1602 Full Code [id number]  Mirna Mires, MD ED   04/29/2012 2051 04/30/2012 0014 Full Code [id number]  Derrill Kay, MD Inpatient         IV Access:   Peripheral IV   Procedures and diagnostic studies:   No results found.   Medical Consultants:   None.   Subjective:    Anthony [name]. relates his breathing is better than yesterday.  Objective:    Vitals:   03/02/21 0508 03/02/21 0534 03/02/21 0833 03/02/21 0837  BP:      Pulse:      Resp: 18 20    Temp:      TempSrc:      SpO2: 97% 92% 90% 90%  Weight:      Height:       SpO2: 90 % O2 Flow Rate (L/min): 4 L/min FiO2 (%): 100 %   Intake/Output Summary (Last 24 hours) at 03/02/2021 1035 Last data filed at 03/01/2021 1311 Gross per 24 hour  Intake --  Output 400 ml  Net -400 ml    Filed Weights   02/12/21 1823 02/27/21 1400  Weight: 87.2 kg 83.2 kg    Exam: General exam: In no acute distress. Respiratory system: Good air movement and clear to auscultation. Cardiovascular system: S1 & S2 heard, RRR. No JVD. Gastrointestinal system: Abdomen is nondistended, soft and nontender.  Extremities: No pedal edema. Skin: No rashes, lesions or ulcers Psychiatry: Judgement and insight appear normal. Mood & affect appropriate.   Data Reviewed:    Labs: Basic Metabolic Panel: Recent Labs  Lab 02/25/21 0318 02/26/21 0256 02/27/21 0246 02/28/21 0302 03/01/21 0309  NA 140 138 137 137 136  K 3.8 4.0 3.5 3.7 3.6  CL 102 101 100 104 101  CO2 30 32 30 28 29   GLUCOSE 79 79 169* 88 161*  BUN 27* 27* 21 16 20   CREATININE 0.78 0.76 0.78 0.65 0.73  CALCIUM 8.7* 8.4* 8.3* 8.3* 8.3*  MG 2.4 2.2 2.1 2.2 2.1    GFR Estimated Creatinine Clearance: 83.5 mL/min (by C-G formula based on SCr of 0.73 mg/dL). Liver Function Tests: No results for input(s): AST, ALT, ALKPHOS, BILITOT, PROT, ALBUMIN in the last 168 hours. No results for  input(s): LIPASE, AMYLASE in the last 168 hours. No results for input(s): AMMONIA in the last 168 hours. Coagulation profile No results for input(s): INR, PROTIME in the last 168 hours. COVID-19 Labs  No results for input(s): DDIMER, FERRITIN, LDH, CRP in the last 72 hours.  Lab Results  Component Value Date   SARSCOV2NAA POSITIVE (A) 02/12/2021    CBC: Recent Labs  Lab 02/24/21 0257 02/25/21 0318 02/26/21 0256 02/27/21 0246  WBC 20.0* 16.0* 12.5* 11.7*  NEUTROABS 16.3* 12.4* 8.9* 8.6*  HGB 16.9 16.2 16.0 15.3  HCT 52.2* 51.0 50.3 46.4  MCV 90.6 91.6 92.0 89.7  PLT 232 219 204 188    Cardiac Enzymes: No results for input(s): CKTOTAL, CKMB, CKMBINDEX, TROPONINI in the last 168 hours. BNP (last 3 results) No results for input(s): PROBNP in the last 8760 hours. CBG: Recent Labs  Lab 03/01/21 1215 03/01/21 1638 03/01/21 1718 03/01/21 2046 03/02/21 0926  GLUCAP 204* 64* 117* 183* 134*    D-Dimer: No results for input(s): DDIMER in the last 72 hours. Hgb A1c: No results for input(s): HGBA1C in the last 72 hours. Lipid Profile: No results for input(s): CHOL, HDL, LDLCALC, TRIG, CHOLHDL, LDLDIRECT in the last 72 hours. Thyroid function studies: No results for input(s): TSH, T4TOTAL, T3FREE, THYROIDAB in the last 72 hours.  Invalid input(s): FREET3 Anemia work up: No results for input(s): VITAMINB12, FOLATE, FERRITIN, TIBC, IRON, RETICCTPCT in the last 72 hours. Sepsis Labs: Recent Labs  Lab 02/24/21 0257 02/25/21 0318 02/26/21 0256 02/27/21 0246  WBC 20.0* 16.0* 12.5* 11.7*    Microbiology No results found for this or any previous visit (from the past 240 hour(s)).   Medications:    apixaban  5 mg Oral BID   vitamin C  500 mg Oral Daily   aspirin EC  81 mg Oral Daily   atorvastatin  40 mg Oral Daily   benzonatate  200 mg Oral TID   budesonide (PULMICORT) nebulizer solution  0.5 mg Nebulization BID   Chlorhexidine Gluconate Cloth  6 each Topical  Q0600   diltiazem  120 mg Oral Daily   escitalopram  10 mg Oral QHS   feeding supplement (GLUCERNA SHAKE)  237 mL Oral BID BM   furosemide  20 mg Oral Q48H   gabapentin  100 mg Oral BID   guaiFENesin  1,200 mg Oral BID   insulin aspart  0-20 Units Subcutaneous TID WC   insulin aspart  0-5 Units Subcutaneous QHS   insulin aspart  8 Units Subcutaneous TID WC   insulin glargine-yfgn  15 Units Subcutaneous BID   ipratropium-albuterol  3 mL Nebulization TID   mouth rinse  15 mL Mouth Rinse BID   metoprolol tartrate  25 mg Oral BID   mirtazapine  30 mg Oral QHS   multivitamin with minerals  1 tablet Oral Daily   Ensure Max Protein  11 oz Oral Daily   zinc sulfate  220 mg Oral Daily   Continuous Infusions:  sodium chloride Stopped (02/17/21 0854)      LOS: 18 days   Charlynne Cousins  [facility]  03/02/2021, 10:35 AM

## 2021-03-02 NOTE — Progress Notes (Signed)
Inpatient Diabetes Program Recommendations  AACE/ADA: New Consensus Statement on Inpatient Glycemic Control (2015)  Target Ranges:  Prepandial:   less than 140 mg/dL      Peak postprandial:   less than 180 mg/dL (1-2 hours)      Critically ill patients:  140 - 180 mg/dL   Lab Results  Component Value Date   GLUCAP 134 (H) 03/02/2021   HGBA1C 7.7 (H) 02/12/2021    Review of Glycemic Control  Hypo on 2/22 after receiving Novolog 15 units at lunch (correction + meal coverage)   Current orders for Inpatient glycemic control: Semglee 15 BID, Novolog 0-20 TID with meals and 0-5 HS + 8 units TID  Inpatient Diabetes Program Recommendations:    Consider decreasing Novolog meal coverage to 6 units TID  Continue to follow.  Thank you. Lorenda Peck, RD, LDN, CDE Inpatient Diabetes Coordinator [phone]

## 2021-03-02 NOTE — TOC Progression Note (Signed)
Transition of Care (TOC) - Progression Note    Patient Details  Name: Anthony [name]. MRN: [id number] Date of Birth: Feb 10, 1950  Transition of Care [facility]) CM/SW Contact  Purcell Mouton, RN Phone Number: 03/02/2021, 4:21 PM  Clinical Narrative:    [facility] call and will take pt back if O2 stay stable.    Expected Discharge Plan: [place] Barriers to Discharge: Continued Medical Work up  Expected Discharge Plan and Services Expected Discharge Plan: [place] In-house Referral: Clinical Social Work   Post Acute Care Choice: [place] Living arrangements for the past 2 months: [place]                 DME Arranged: N/A DME Agency: NA                   Social Determinants of Health (SDOH) Interventions    Readmission Risk Interventions Readmission Risk Prevention Plan 02/13/2021  Transportation Screening Complete  PCP or Specialist Appt within 5-7 Days Complete  Home Care Screening Complete  Medication Review (RN CM) Complete  Some recent data might be hidden

## 2021-03-02 NOTE — Care Management Important Message (Signed)
Important Message  Patient Details IM Letter placed in Patients room. Name: Anthony [name]. MRN: [id number] Date of Birth: 1950-10-04   Medicare Important Message Given:  Yes     Kerin Salen 03/02/2021, 1:16 PM

## 2021-03-03 DIAGNOSIS — R918 Other nonspecific abnormal finding of lung field: Secondary | ICD-10-CM

## 2021-03-03 DIAGNOSIS — R0902 Hypoxemia: Secondary | ICD-10-CM

## 2021-03-03 LAB — GLUCOSE, CAPILLARY
Glucose-Capillary: 110 mg/dL — ABNORMAL HIGH (ref 70–99)
Glucose-Capillary: 141 mg/dL — ABNORMAL HIGH (ref 70–99)

## 2021-03-03 MED ORDER — IPRATROPIUM-ALBUTEROL 0.5-2.5 (3) MG/3ML IN SOLN: 3.0000 mL | Freq: Two times a day (BID) | RESPIRATORY_TRACT | Status: DC

## 2021-03-03 MED ORDER — ESCITALOPRAM OXALATE 10 MG PO TABS: 10.0000 mg | ORAL_TABLET | Freq: Every day | ORAL | Status: AC

## 2021-03-03 MED ORDER — FUROSEMIDE 20 MG PO TABS: 20.0000 mg | ORAL_TABLET | ORAL | Status: DC

## 2021-03-03 MED ORDER — APIXABAN 5 MG PO TABS: 5.0000 mg | ORAL_TABLET | Freq: Two times a day (BID) | ORAL | Status: AC

## 2021-03-03 MED ORDER — METOPROLOL SUCCINATE ER 25 MG PO TB24: 25.0000 mg | ORAL_TABLET | Freq: Every day | ORAL | 3 refills | Status: DC

## 2021-03-03 MED ORDER — DILTIAZEM HCL ER COATED BEADS 120 MG PO CP24: 120.0000 mg | ORAL_CAPSULE | Freq: Every day | ORAL | Status: DC

## 2021-03-03 NOTE — TOC Progression Note (Addendum)
Transition of Care (TOC) - Progression Note    Patient Details  Name: Anthony [name]. MRN: [id number] Date of Birth: May 10, 1950  Transition of Care [facility]) CM/SW Contact  Leeroy Cha, RN Phone Number: 03/03/2021, 10:38 AM  Clinical Narrative:    [facility] for [facility]/message left for need of auth. Name and number left for call back No call back tct-[employer] for [facility] product/auth started and inforamtion faxed to [place] at [phone] Auth number approval obtainied from [id number] for [id number]/medical necessity form completed. Wil;l; go to room 904A at [facility] place.  Floor rn alerted. Expected Discharge Plan: [place] Barriers to Discharge: Continued Medical Work up  Expected Discharge Plan and Services Expected Discharge Plan: [place] In-house Referral: Clinical Social Work   Post Acute Care Choice: [place] Living arrangements for the past 2 months: [place] Expected Discharge Date: 03/03/21               DME Arranged: N/A DME Agency: NA                   Social Determinants of Health (SDOH) Interventions    Readmission Risk Interventions Readmission Risk Prevention Plan 02/13/2021  Transportation Screening Complete  PCP or Specialist Appt within 5-7 Days Complete  Home Care Screening Complete  Medication Review (RN CM) Complete  Some recent data might be hidden

## 2021-03-03 NOTE — Plan of Care (Signed)
°  Problem: Clinical Measurements: Goal: Respiratory complications will improve Outcome: Adequate for Discharge   Problem: Activity: Goal: Risk for activity intolerance will decrease Outcome: Adequate for Discharge   Problem: Nutrition: Goal: Adequate nutrition will be maintained Outcome: Adequate for Discharge   Problem: Elimination: Goal: Will not experience complications related to bowel motility Outcome: Adequate for Discharge   Problem: Education: Goal: Knowledge of General Education information will improve Description: Including pain rating scale, medication(s)/side effects and non-pharmacologic comfort measures Outcome: Adequate for Discharge

## 2021-03-03 NOTE — Progress Notes (Signed)
Occupational Therapy Treatment Patient Details Name: Anthony [name]. MRN: [id number] DOB: 11-18-1950 Today's Date: 03/03/2021   History of present illness Patient is a 71 year old male who resides at [facility] place and admitted for acute respiratory failure 2* COVID. PMH includes L AKA, hx falls   OT comments  Patient required quite a bit of encouragement to participate and get out of bed. He was able to come into standing with mod assist today and Rw but unable to maintain standing position or use walker to transfer due to right knee pain. Patient able to squat pivot into recliner with min guard. Patient found on 3 L [place] and o2 sats maintained above 90% during treatment.    Recommendations for follow up therapy are one component of a multi-disciplinary discharge planning process, led by the attending physician.  Recommendations may be updated based on patient status, additional functional criteria and insurance authorization.    Follow Up Recommendations  Skilled nursing-short term rehab (<3 hours/day)    Assistance Recommended at Discharge Frequent or constant Supervision/Assistance  Patient can return home with the following  A lot of help with walking and/or transfers;A lot of help with bathing/dressing/bathroom;Assistance with cooking/housework;Direct supervision/assist for medications management;Direct supervision/assist for financial management;Assist for transportation;Help with stairs or ramp for entrance   Equipment Recommendations  None recommended by OT    Recommendations for Other Services      Precautions / Restrictions Precautions Precautions: Fall Precaution Comments: multiple B rib fractures, Lt AKA, right knee pain Restrictions Weight Bearing Restrictions: No       Mobility Bed Mobility Overal bed mobility: Needs Assistance Bed Mobility: Supine to Sit     Supine to sit: Supervision, HOB elevated     General bed mobility comments: Increased time but able to  transfer in to sitting without assistance.    Transfers Overall transfer level: Needs assistance Equipment used: Rolling walker (2 wheels) Transfers: Sit to/from Stand Sit to Stand: From elevated surface, Mod assist   Squat pivot transfers: Min guard       General transfer comment: Patient required mod assist to rise today from elevated bed height (not too high). Able to come into full standing but could not maintain position due to knee pain and having to sit back down. Patient able to perform squat pivot into recliner using chair arm and only with min guard.     Balance Overall balance assessment: Needs assistance Sitting-balance support: No upper extremity supported Sitting balance-Leahy Scale: Fair                                     ADL either performed or assessed with clinical judgement   ADL Overall ADL's : Needs assistance/impaired Eating/Feeding: Independent   Grooming: Set up;Wash/dry face;Bed level Grooming Details (indicate cue type and reason): washed face at bed level to assist with alertness level                                      Cognition Arousal/Alertness: Awake/alert Behavior During Therapy: WFL for tasks assessed/performed Overall Cognitive Status: No family/caregiver present to determine baseline cognitive functioning                                 General Comments: Questionable historian.  He is able to state the month and year but his ability to report on yesterday's activity with physical therapy is poor. His PLOF information is questionable as well.              General Comments Patient on 3 L [place] with o2 sat 91% in supine. Increased to 93% with seated position in chair.    Pertinent Vitals/ Pain       Pain Assessment Pain Assessment: Faces Faces Pain Scale: Hurts little more Pain Location: Rt knee with OOB mobility Pain Descriptors / Indicators: Sharp, Grimacing, Guarding Pain  Intervention(s): Limited activity within patient's tolerance  Home Living                                          Prior Functioning/Environment              Frequency  Min 2X/week        Progress Toward Goals  OT Goals(current goals can now be found in the care plan section)  Progress towards OT goals: Progressing toward goals  Acute Rehab OT Goals Patient Stated Goal: did not state OT Goal Formulation: With patient Time For Goal Achievement: 03/14/21 Potential to Achieve Goals: Good  Plan Discharge plan remains appropriate    Co-evaluation          OT goals addressed during session:  (actvity tolerance)      AM-PAC OT "6 Clicks" Daily Activity     Outcome Measure   Help from another person eating meals?: None Help from another person taking care of personal grooming?: A Little Help from another person toileting, which includes using toliet, bedpan, or urinal?: A Lot Help from another person bathing (including washing, rinsing, drying)?: A Lot Help from another person to put on and taking off regular upper body clothing?: A Little Help from another person to put on and taking off regular lower body clothing?: A Lot 6 Click Score: 16    End of Session Equipment Utilized During Treatment: Rolling walker (2 wheels);Oxygen  OT Visit Diagnosis: Other abnormalities of gait and mobility (R26.89);Muscle weakness (generalized) (M62.81);History of falling (Z91.81)   Activity Tolerance Patient limited by pain   Patient Left in chair;with call bell/phone within reach;with chair alarm set   Nurse Communication Mobility status        Time: 2979-8921 OT Time Calculation (min): 20 min  Charges: OT General Charges $OT Visit: 1 Visit OT Treatments $Therapeutic Activity: 8-22 mins  Derl Barrow, OTR/L [place]  Office [phone] Pager: [place] 03/03/2021, 2:33 PM

## 2021-03-03 NOTE — Plan of Care (Signed)
?  Problem: Clinical Measurements: ?Goal: Ability to maintain clinical measurements within normal limits will improve ?Outcome: Progressing ?Goal: Will remain free from infection ?Outcome: Progressing ?Goal: Diagnostic test results will improve ?Outcome: Progressing ?  ?

## 2021-03-03 NOTE — Discharge Summary (Addendum)
Physician Discharge Summary  Anthony [name]. ZOX:[id number] DOB: 09/08/1950 DOA: 02/12/2021  PCP: [name], DO  Admit date: 02/12/2021 Discharge date: 03/03/2021  Admitted From: Home Disposition:  SNF  Recommendations for Outpatient Follow-up:  Follow up with PCP in 1-2 weeks   Home Health:No Equipment/Devices:None  Discharge Condition:Stable CODE STATUS:Full Diet recommendation: Heart Healthy  Brief/Interim Summary: 71 y.o. 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  The patient elected to be a DNI, PCCM was consulted on February 18, 2020 for acute respiratory failure with hypoxia. He has been denied from [facility] after several appeals.  Discharge Diagnoses:  Principal Problem:   Acute respiratory failure with hypoxia (HCC) Active Problems:   CAD (coronary artery disease)   Hyperlipidemia associated with type 2 diabetes mellitus (HCC)   COPD (chronic obstructive pulmonary disease) (HCC)   DM2 (diabetes mellitus, type 2) (HCC)   Multiple lung nodules   Hypokalemia   Bipolar 1 disorder (HCC)   Pressure injury of skin   Rib fractures   History of DVT (deep vein thrombosis)   HTN (hypertension)   Paroxysmal atrial fibrillation with RVR (HCC)  Acute respiratory failure with hypoxia likely due to underlying COPD with superimposed COVID-19 infection: CT  angio of the chest showed bilateral paraseptal eczema with chronic ILD and multiple pulmonary nodules. PCCM was consulted along with cardiology. He was started on IV steroids antibiotics and IV remdesivir, he has completed his course in-house. He was weaned.  Needs 15 L nasal cannula has been weaned to 3 L of oxygen. Physical therapy evaluated the patient and recommended home health PT.  Paroxysmal atrial fibrillation: Noted A-fib with admission was started on metoprolol and diltiazem his rate is controlled he is currently on Eliquis for CHA2DS2-VASc 2. Has remained in sinus rhythm 2D echo showed preserved EF with no diastolic dysfunction.  COPD: Question mild follow-up with pulmonary as an outpatient.  History of DVT: On admission was on Xarelto Dr. Rhett Bannister lower extremity DVT on the right. He was transitioned to oral Eliquis which she continue as an outpatient.  Hypokalemia: Etiology not resolved.  Multiple rib fractures: Likely due to recurrent falls in the setting of left AKA, continue symptom spirometry physical therapy evaluated patient, he will go to skilled nursing facility.   Bipolar disorder: Continue Lexapro and Atarax.  Multiple pulmonary nodules: CT imaging in 2020 there is a 6 mm left lower lobe which is smaller can be due to previous. Will need to follow-up with pulmonary as an outpatient repeat CT scan in 6 to 8 weeks.  Diabetes mellitus type 2: With an A1c of 7.7, no changes made to his medication continue current regimen will need to be titrated as an outpatient. Discharge Instructions  Discharge Instructions     Diet - low sodium heart healthy   Complete by: As directed    Increase activity slowly   Complete by: As directed    No wound care  Complete by: As directed       Allergies as of 03/03/2021       Reactions   Flomax [tamsulosin Hcl] Other (See Comments)   This medication makes patient feel sick   Penicillins Other (See Comments)    Reaction unknown occurred during childhood Has patient had a PCN reaction causing immediate rash, facial/tongue/throat swelling, SOB or lightheadedness with hypotension: unsure - childhood reaction Has patient had a PCN reaction causing severe rash involving mucus membranes or skin necrosis: unsure - childhood reaction Has patient had a PCN reaction that required hospitalization no Has patient had a PCN reaction occurring within the last 10 years: no If all of the above answers are "NO", then may p        Medication List     STOP taking these medications    lisinopril 5 MG tablet Commonly known as: ZESTRIL   mirtazapine 30 MG tablet Commonly known as: REMERON   nystatin ointment Commonly known as: MYCOSTATIN   rivaroxaban 20 MG Tabs tablet Commonly known as: XARELTO   temazepam 15 MG capsule Commonly known as: RESTORIL       TAKE these medications    albuterol 108 (90 Base) MCG/ACT inhaler Commonly known as: VENTOLIN HFA Inhale 2 puffs into the lungs in the morning, at noon, and at bedtime.   apixaban 5 MG Tabs tablet Commonly known as: ELIQUIS Take 1 tablet (5 mg total) by mouth 2 (two) times daily.   aspirin EC 81 MG tablet Take 1 tablet (81 mg total) by mouth daily.   atorvastatin 40 MG tablet Commonly known as: LIPITOR Take 1 tablet (40 mg total) by mouth daily.   Baclofen 5 MG Tabs Take 5 mg by mouth in the morning and at bedtime. What changed: Another medication with the same name was removed. Continue taking this medication, and follow the directions you see here.   blood glucose meter kit and supplies Kit Dispense based on patient and insurance preference. Use to check BG once daily or every other other.  Dx: E11.9   diltiazem 120 MG 24 hr capsule Commonly known as: CARDIZEM CD Take 1 capsule (120 mg total) by mouth daily. Start taking on: March 04, 2021   escitalopram 10 MG tablet Commonly known as: LEXAPRO Take 1 tablet (10 mg total) by  mouth at bedtime.   furosemide 20 MG tablet Commonly known as: LASIX Take 1 tablet (20 mg total) by mouth every other day. Start taking on: March 04, 2021   gabapentin 100 MG capsule Commonly known as: NEURONTIN Take 100 mg by mouth 2 (two) times daily. What changed: Another medication with the same name was removed. Continue taking this medication, and follow the directions you see here.   glipiZIDE 10 MG tablet Commonly known as: GLUCOTROL Take 1 tablet (10 mg total) by mouth 2 (two) times daily before a meal.   metFORMIN 500 MG 24 hr tablet Commonly known as: GLUCOPHAGE-XR TAKE 2 TABLETS BY MOUTH  DAILY WITH BREAKFAST What changed:  how much to take how to take this when to take this additional instructions   metoprolol succinate 25 MG 24 hr tablet Commonly known as: TOPROL-XL Take 1 tablet (25 mg total) by mouth daily. What changed: how much to take   Robitussin Cough+Chest Cong DM 10-200 MG Caps Generic drug: Dextromethorphan-guaiFENesin Take 1 capsule by mouth in the morning, at noon, and at bedtime.   Semaglutide 7 MG Tabs Take 7 mg by mouth daily.   sertraline  50 MG tablet Commonly known as: ZOLOFT Take 1/2 tab ($Remove'25mg'BfZsofP$ ) by mouth at bedtime for 6 days then increase to 1 tab ($Remo'50mg'OCrIe$ ) by mouth every night at bedtime.   traMADol 50 MG tablet Commonly known as: ULTRAM TAKE 1 TABLET BY MOUTH EVERY 6 HOURS AS NEEDED FOR SEVERE PAIN   traZODone 50 MG tablet Commonly known as: DESYREL TAKE 1 TO 2 TABLETS BY MOUTH AT BEDTIME AS NEEDED FOR SLEEP   vitamin C 1000 MG tablet Take 1,000 mg by mouth daily.        Follow-up Information     Imogene Burn, PA-C Follow up on 06/20/2021.   Specialty: Cardiology Why: $RemoveBeforeD'@10'tlIkMoUTHvTXCT$ :45am for follow up with Dr. Camillia Herter PA. Please arrive 15 minutes early. Call if need to reschedule. Contact information: [address] [place] [address] [phone]                Allergies  Allergen Reactions    Flomax [Tamsulosin Hcl] Other (See Comments)    This medication makes patient feel sick   Penicillins Other (See Comments)    Reaction unknown occurred during childhood Has patient had a PCN reaction causing immediate rash, facial/tongue/throat swelling, SOB or lightheadedness with hypotension: unsure - childhood reaction Has patient had a PCN reaction causing severe rash involving mucus membranes or skin necrosis: unsure - childhood reaction Has patient had a PCN reaction that required hospitalization no Has patient had a PCN reaction occurring within the last 10 years: no If all of the above answers are "NO", then may p    Consultations: Pulmonary and critical care Cardiology   Procedures/Studies: DG Chest 1 View  Result Date: 02/20/2021 CLINICAL DATA:  Hypoxia with history of COVID. EXAM: CHEST  1 VIEW COMPARISON:  Portable chest yesterday at 8:20 a.m. FINDINGS: 4:50 a.m., 02/20/2021. The heart is enlarged. There is increased central vascular prominence. There is increased interstitial consolidation in the hypoinflated lower lung fields consistent with edema. Findings may suspect CHF or fluid overload. The lungs are expiratory with small left pleural effusion noted and increased left basilar opacity, compatible with atelectasis or consolidation. No significant right pleural collection is seen. The upper lung fields remain clear.  No other changes are noted. There is aortic atherosclerosis and tortuosity.  Stable mediastinum. IMPRESSION: 1. Increased central vascular congestion and lower zonal interstitial edema. 2. Persistent left pleural effusion with increased left basilar opacity, atelectasis versus pneumonic consolidation. 3. Aortic atherosclerosis. Electronically Signed   By: Telford Nab M.D.   On: 02/20/2021 06:23   CT Angio Chest PE W and/or Wo Contrast  Result Date: 02/12/2021 CLINICAL DATA:  Shortness of breath EXAM: CT ANGIOGRAPHY CHEST WITH CONTRAST TECHNIQUE: Multidetector CT  imaging of the chest was performed using the standard protocol during bolus administration of intravenous contrast. Multiplanar CT image reconstructions and MIPs were obtained to evaluate the vascular anatomy. RADIATION DOSE REDUCTION: This exam was performed according to the departmental dose-optimization program which includes automated exposure control, adjustment of the mA and/or kV according to patient size and/or use of iterative reconstruction technique. CONTRAST:  77mL OMNIPAQUE IOHEXOL 350 MG/ML SOLN COMPARISON:  02/11/2018 FINDINGS: Cardiovascular: Satisfactory opacification of the pulmonary arteries to the segmental level. No evidence of pulmonary embolism. Normal heart size. No pericardial effusion. Thoracic aortic atherosclerosis. Coronary artery atherosclerosis. Mediastinum/Nodes: No enlarged mediastinal, hilar, or axillary lymph nodes. Thyroid gland, trachea, and esophagus demonstrate no significant findings. Lungs/Pleura: Bilateral paraseptal emphysema. Bilateral peripheral chronic interstitial lung disease. Previously demonstrated 6 mm  left lower lobe pulmonary nodule is not well appreciated on the current exam. Likely obscured by bibasilar atelectasis. 5 mm right middle lobe pulmonary nodule unchanged compared with the prior exam. Adjacent 3 mm pulmonary nodule unchanged compared with the prior exam. No pneumothorax. No pleural effusion. Upper Abdomen: No acute abnormality. Musculoskeletal: Nondisplaced fracture of the anterolateral fifth rib. Nondisplaced fracture of the posterolateral ninth rib. Nondisplaced fracture of the left anterolateral third, fourth, and fifth rib. No aggressive osseous lesion. Review of the MIP images confirms the above findings. IMPRESSION: 1. No acute pulmonary embolus. 2. Chronic interstitial lung disease. 3. Multiple bilateral rib fractures as described above. 4. Coronary artery atherosclerosis. 5. Aortic Atherosclerosis (ICD10-I70.0) and Emphysema (ICD10-J43.9).  Electronically Signed   By: Kathreen Devoid M.D.   On: 02/12/2021 15:03   DG CHEST PORT 1 VIEW  Result Date: 02/19/2021 CLINICAL DATA:  71 year old male with history of hypoxia. EXAM: PORTABLE CHEST 1 VIEW COMPARISON:  Chest x-ray 02/18/2021. FINDINGS: Lung volumes remain low. Opacities in the lung bases (left-greater-than-right) may reflect areas of atelectasis and/or consolidation. Small left pleural effusion. No pneumothorax. No suspicious appearing pulmonary nodules or masses are noted. No evidence of pulmonary edema. Heart size is normal. Upper mediastinal contours are within normal limits. Atherosclerotic calcifications are noted in the thoracic aorta. IMPRESSION: 1. Low lung volumes with bibasilar (left-greater-than-right) areas of atelectasis and/or consolidation. 2. Small left pleural effusion. 3. Aortic atherosclerosis. Electronically Signed   By: [name] M.D.   On: 02/19/2021 08:40   DG CHEST PORT 1 VIEW  Result Date: 02/18/2021 CLINICAL DATA:  71 year old male with history of acute respiratory failure with hypoxia. EXAM: PORTABLE CHEST 1 VIEW COMPARISON:  Chest x-ray 02/16/2021. FINDINGS: Lung volumes are low. Elevation of the right hemidiaphragm, similar to the prior study. Diffuse peribronchial cuffing. Bibasilar opacities which may reflect areas of atelectasis and/or consolidation. Blunting of the left costophrenic sulcus, suggesting a small left pleural effusion. No pneumothorax. No evidence of pulmonary edema. Heart size is normal. Upper mediastinal contours are within normal limits. IMPRESSION: 1. Diffuse peribronchial cuffing concerning for an acute bronchitis. 2. Bibasilar opacities which may reflect areas of atelectasis and/or consolidation. 3. Small left pleural effusion. 4. Elevation of the right hemidiaphragm again noted. 5. Aortic atherosclerosis. Electronically Signed   By: [name] M.D.   On: 02/18/2021 08:09   DG CHEST PORT 1 VIEW  Result Date: 02/16/2021 CLINICAL  DATA:  Acute respiratory failure with hypoxia. EXAM: PORTABLE CHEST 1 VIEW COMPARISON:  AP chest 02/12/2021, chest two views 09/08/2018 FINDINGS: Cardiac silhouette is again at the upper limits of normal size. Mediastinal contours are within normal limits with calcification again seen within the aortic arch. There is increased now moderate right hemidiaphragm elevation with decreased right lung volumes. Right basilar horizontal linear lung markings, likely a combination of scarring and subsegmental atelectasis. Diffuse bilateral mild interstitial thickening is similar to prior. No definite pleural effusion. No pneumothorax. No acute skeletal abnormality. IMPRESSION: New moderate elevation of the right hemidiaphragm. Chronic bilateral interstitial thickening, likely scarring. There appears to be subsegmental atelectasis within the right lung. Unchanged greater aeration of the left lung. Electronically Signed   By: Yvonne Kendall M.D.   On: 02/16/2021 08:27   DG Chest Portable 1 View  Result Date: 02/12/2021 CLINICAL DATA:  Shortness of breath. Tested positive for COVID on the 27th of January. EXAM: PORTABLE CHEST 1 VIEW COMPARISON:  Chest x-rays dated 01/30/2019 and 09/08/2018 FINDINGS: Heart size and mediastinal contours are stable. Coarse  interstitial lung markings are seen bilaterally, not significantly changed compared to the previous exams. No confluent opacity to suggest a superimposed pneumonia or pulmonary edema. No pleural effusion or pneumothorax is seen. Osseous structures about the chest are unremarkable. IMPRESSION: 1. No active disease. No evidence of pneumonia or pulmonary edema. 2. Coarse interstitial lung markings bilaterally, not significantly changed compared to the previous exams, consistent with chronic interstitial lung disease/fibrosis. Electronically Signed   By: Franki Cabot M.D.   On: 02/12/2021 13:35   ECHOCARDIOGRAM COMPLETE BUBBLE STUDY  Result Date: 02/21/2021    ECHOCARDIOGRAM  REPORT   Patient Name:   Anthony [name]. Date of Exam: 02/21/2021 Medical Rec #:  [id number]          Height:       69.0 in Accession #:    [id number]         Weight:       192.2 lb Date of Birth:  [birth year]          BSA:          2.032 m Patient Age:    25 years           BP:           112/78 mmHg Patient Gender: M                  HR:           69 bpm. Exam Location:  Inpatient Procedure: 2D Echo, Cardiac Doppler, Color Doppler and Saline Contrast Bubble            Study Indications:    CHF/ dyspnea  History:        Patient has no prior history of Echocardiogram examinations. CAD                 and Previous Myocardial Infarction, COPD; Risk                 Factors:Dyslipidemia, Hypertension and Diabetes.  Sonographer:    Jonelle Sidle CROWN Referring Phys: [id number] RIPUDEEP K RAI  Sonographer Comments: Technically difficult study due to poor echo windows. IMPRESSIONS  1. Non-diagnostic bubble study despite 3 attempts. Would recommend TCDs or TEE if clinically indicated.  2. Left ventricular ejection fraction, by estimation, is 60 to 65%. The left ventricle has normal function. The left ventricle has no regional wall motion abnormalities. Left ventricular diastolic parameters were normal.  3. Right ventricular systolic function is normal. The right ventricular size is normal. Tricuspid regurgitation signal is inadequate for assessing PA pressure.  4. The mitral valve is grossly normal. No evidence of mitral valve regurgitation. No evidence of mitral stenosis.  5. The aortic valve is tricuspid. There is mild calcification of the aortic valve. Aortic valve regurgitation is not visualized. No aortic stenosis is present.  6. The inferior vena cava is normal in size with greater than 50% respiratory variability, suggesting right atrial pressure of 3 mmHg. Conclusion(s)/Recommendation(s): No intracardiac source of embolism detected on this transthoracic study. Consider a transesophageal echocardiogram to exclude cardiac source  of embolism if clinically indicated. FINDINGS  Left Ventricle: Left ventricular ejection fraction, by estimation, is 60 to 65%. The left ventricle has normal function. The left ventricle has no regional wall motion abnormalities. The left ventricular internal cavity size was normal in size. There is  no left ventricular hypertrophy. Left ventricular diastolic parameters were normal. Right Ventricle: The right ventricular size is normal. No increase in right ventricular wall thickness.  Right ventricular systolic function is normal. Tricuspid regurgitation signal is inadequate for assessing PA pressure. Left Atrium: Left atrial size was normal in size. Right Atrium: Right atrial size was normal in size. Pericardium: Trivial pericardial effusion is present. Presence of epicardial fat layer. Mitral Valve: The mitral valve is grossly normal. No evidence of mitral valve regurgitation. No evidence of mitral valve stenosis. MV peak gradient, 1.9 mmHg. The mean mitral valve gradient is 1.0 mmHg. Tricuspid Valve: The tricuspid valve is grossly normal. Tricuspid valve regurgitation is not demonstrated. No evidence of tricuspid stenosis. Aortic Valve: The aortic valve is tricuspid. There is mild calcification of the aortic valve. Aortic valve regurgitation is not visualized. No aortic stenosis is present. Aortic valve mean gradient measures 2.5 mmHg. Aortic valve peak gradient measures 4.5 mmHg. Aortic valve area, by VTI measures 4.39 cm. Pulmonic Valve: The pulmonic valve was grossly normal. Pulmonic valve regurgitation is not visualized. No evidence of pulmonic stenosis. Aorta: The aortic root and ascending aorta are structurally normal, with no evidence of dilitation. Venous: The inferior vena cava is normal in size with greater than 50% respiratory variability, suggesting right atrial pressure of 3 mmHg. IAS/Shunts: The atrial septum is grossly normal. Agitated saline contrast was given intravenously to evaluate for  intracardiac shunting.  LEFT VENTRICLE PLAX 2D LVIDd:         5.10 cm     Diastology LVIDs:         3.70 cm     LV e' medial:    7.18 cm/s LV PW:         1.20 cm     LV E/e' medial:  7.0 LV IVS:        1.10 cm     LV e' lateral:   10.30 cm/s LVOT diam:     2.50 cm     LV E/e' lateral: 4.9 LV SV:         79 LV SV Index:   39 LVOT Area:     4.91 cm  LV Volumes (MOD) LV vol d, MOD A2C: 49.6 ml LV vol d, MOD A4C: 87.7 ml LV vol s, MOD A2C: 18.3 ml LV vol s, MOD A4C: 30.3 ml LV SV MOD A2C:     31.3 ml LV SV MOD A4C:     87.7 ml LV SV MOD BP:      45.9 ml RIGHT VENTRICLE RV Basal diam:  4.50 cm RV Mid diam:    2.90 cm RV S prime:     19.40 cm/s TAPSE (M-mode): 3.4 cm LEFT ATRIUM             Index LA diam:        4.50 cm 2.22 cm/m LA Vol (A2C):   22.8 ml 11.22 ml/m LA Vol (A4C):   35.4 ml 17.43 ml/m LA Biplane Vol: 31.1 ml 15.31 ml/m  AORTIC VALVE                    PULMONIC VALVE AV Area (Vmax):    4.28 cm     PV Vmax:       0.85 m/s AV Area (Vmean):   4.17 cm     PV Vmean:      59.700 cm/s AV Area (VTI):     4.39 cm     PV VTI:        0.171 m AV Vmax:           105.55 cm/s  PV Peak grad:  2.9 mmHg AV Vmean:          68.650 cm/s  PV Mean grad:  2.0 mmHg AV VTI:            0.180 m AV Peak Grad:      4.5 mmHg AV Mean Grad:      2.5 mmHg LVOT Vmax:         92.10 cm/s LVOT Vmean:        58.300 cm/s LVOT VTI:          0.161 m LVOT/AV VTI ratio: 0.89  AORTA Ao Root diam: 3.30 cm Ao Asc diam:  3.30 cm MITRAL VALVE MV Area (PHT): 3.08 cm    SHUNTS MV Area VTI:   4.14 cm    Systemic VTI:  0.16 m MV Peak grad:  1.9 mmHg    Systemic Diam: 2.50 cm MV Mean grad:  1.0 mmHg MV Vmax:       0.68 m/s MV Vmean:      41.6 cm/s MV Decel Time: 246 msec MV E velocity: 50.10 cm/s MV A velocity: 69.00 cm/s MV E/A ratio:  0.73 Eleonore Chiquito MD Electronically signed by Eleonore Chiquito MD Signature Date/Time: 02/21/2021/2:29:35 PM    Final    VAS [place] LOWER EXTREMITY VENOUS (DVT)  Result Date: 02/19/2021  Lower Venous DVT Study Patient  Name:  Anthony [name].  Date of Exam:   02/18/2021 Medical Rec #: [id number]           Accession #:    [id number] Date of Birth: 08/08/1950           Patient Gender: M Patient Age:   27 years Exam Location:  [facility] Procedure:      VAS [place] LOWER EXTREMITY VENOUS (DVT) Referring Phys: Ina Homes --------------------------------------------------------------------------------  Indications: History of right lower extremity DVT, Covid-19, edema.  Risk Factors: History of arterial disease with left above knee amputation. Anticoagulation: Xarelto. Limitations: Acoustic shadowing due to overlying arterial calcification. Comparison Study: 03-28-2017 Most recent prior right lower extremity venous was                   negative for DVT. 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 Performing Technologist: Darlin Coco RDMS, RVT  Examination Guidelines: A complete evaluation includes B-mode imaging, spectral Doppler, color Doppler, and power Doppler as needed of all accessible portions of each vessel. Bilateral testing is considered an integral part of a complete examination. Limited examinations for reoccurring indications may be performed as noted. The reflux portion of the exam is performed with the patient in reverse Trendelenburg.  +---------+---------------+---------+-----------+----------+-----------------+  RIGHT     Compressibility Phasicity Spontaneity Properties Thrombus Aging     +---------+---------------+---------+-----------+----------+-----------------+  CFV       Full            Yes       Yes                                       +---------+---------------+---------+-----------+----------+-----------------+  SFJ       Full                                                                +---------+---------------+---------+-----------+----------+-----------------+  FV Prox   Partial         Yes       Yes                    Chronic             +---------+---------------+---------+-----------+----------+-----------------+  FV Mid    Partial         Yes       Yes                    Age Indeterminate  +---------+---------------+---------+-----------+----------+-----------------+  FV Distal None            No        No                     Age Indeterminate  +---------+---------------+---------+-----------+----------+-----------------+  PFV       Full            Yes       Yes                                       +---------+---------------+---------+-----------+----------+-----------------+  POP       Partial         Yes       Yes                    Age Indeterminate  +---------+---------------+---------+-----------+----------+-----------------+  PTV       Partial                                          Age Indeterminate  +---------+---------------+---------+-----------+----------+-----------------+  PERO      Full                                                                +---------+---------------+---------+-----------+----------+-----------------+  Gastroc   Full                                                                +---------+---------------+---------+-----------+----------+-----------------+ 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  +---------+---------------+---------+-----------+----------+--------------+  LEFT      Compressibility Phasicity Spontaneity Properties Thrombus Aging  +---------+---------------+---------+-----------+----------+--------------+  CFV       Full            Yes       Yes                                    +---------+---------------+---------+-----------+----------+--------------+  SFJ       Full                                                             +---------+---------------+---------+-----------+----------+--------------+  FV Prox   Full                                                              +---------+---------------+---------+-----------+----------+--------------+  FV Mid    Full                                                             +---------+---------------+---------+-----------+----------+--------------+  FV Distal Full                                                             +---------+---------------+---------+-----------+----------+--------------+  PFV       Full                                                             +---------+---------------+---------+-----------+----------+--------------+ 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    Summary: RIGHT: - Findings consistent with age indeterminate deep vein thrombosis involving the right femoral vein, right popliteal vein, and right posterior tibial veins. - No cystic structure found in the popliteal fossa. 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  LEFT: - No evidence of common femoral vein obstruction. Patent femoral vein and profunda femoral vein. 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  *See table(s) above for measurements and observations. Electronically signed by Jamelle Haring on 02/19/2021 at 10:40:46 AM.    Final    (Echo, Carotid, EGD, Colonoscopy, ERCP)    Subjective: No complaints  Discharge Exam: Vitals:   03/03/21 0843 03/03/21 0845  BP:    Pulse:    Resp:    Temp:    SpO2: 91% 91%   Vitals:   03/02/21 1844 03/03/21 0534 03/03/21 0843 03/03/21 0845  BP: 102/62 118/73    Pulse: 60 63    Resp: 17 16    Temp: 97.9 F (36.6 C) 98.7 F (37.1 C)    TempSrc: Oral Oral    SpO2: (!) 85% 91% 91% 91%  Weight:      Height:        General: Pt is alert, awake, not in acute distress Cardiovascular: RRR, S1/S2 +, no rubs, no gallops Respiratory: CTA bilaterally, no wheezing, no rhonchi Abdominal: Soft, NT, ND, bowel sounds + Extremities:  no edema, no cyanosis    The results of significant diagnostics from this hospitalization (including imaging, microbiology, ancillary and laboratory) are listed below for reference.     Microbiology: No results found for this or any previous visit (from the past 240 hour(s)).   Labs: BNP (last 3 results) Recent Labs    02/16/21 0256 02/26/21 0256  BNP 101.5* 68.3   Basic Metabolic Panel: Recent  Labs  Lab 02/25/21 0318 02/26/21 0256 02/27/21 0246 02/28/21 0302 03/01/21 0309  NA 140 138 137 137 136  K 3.8 4.0 3.5 3.7 3.6  CL 102 101 100 104 101  CO2 30 32 30 28 29   GLUCOSE 79 79 169* 88 161*  BUN 27* 27* 21 16 20   CREATININE 0.78 0.76 0.78 0.65 0.73  CALCIUM 8.7* 8.4* 8.3* 8.3* 8.3*  MG 2.4 2.2 2.1 2.2 2.1   Liver Function Tests: No results for input(s): AST, ALT, ALKPHOS, BILITOT, PROT, ALBUMIN in the last 168 hours. No results for input(s): LIPASE, AMYLASE in the last 168 hours. No results for input(s): AMMONIA in the last 168 hours. CBC: Recent Labs  Lab 02/25/21 0318 02/26/21 0256 02/27/21 0246  WBC 16.0* 12.5* 11.7*  NEUTROABS 12.4* 8.9* 8.6*  HGB 16.2 16.0 15.3  HCT 51.0 50.3 46.4  MCV 91.6 92.0 89.7  PLT 219 204 188   Cardiac Enzymes: No results for input(s): CKTOTAL, CKMB, CKMBINDEX, TROPONINI in the last 168 hours. BNP: Invalid input(s): POCBNP CBG: Recent Labs  Lab 03/02/21 0926 03/02/21 1150 03/02/21 1636 03/02/21 2027 03/03/21 0746  GLUCAP 134* 286* 85 91 110*   D-Dimer No results for input(s): DDIMER in the last 72 hours. Hgb A1c No results for input(s): HGBA1C in the last 72 hours. Lipid Profile No results for input(s): CHOL, HDL, LDLCALC, TRIG, CHOLHDL, LDLDIRECT in the last 72 hours. Thyroid function studies No results for input(s): TSH, T4TOTAL, T3FREE, THYROIDAB in the last 72 hours.  Invalid input(s): FREET3 Anemia work up No results for input(s): VITAMINB12, FOLATE, FERRITIN, TIBC, IRON, RETICCTPCT in the last 72  hours. Urinalysis    Component Value Date/Time   COLORURINE YELLOW 09/23/2018 1429   APPEARANCEUR CLEAR 09/23/2018 1429   LABSPEC 1.011 09/23/2018 1429   PHURINE 5.5 09/23/2018 1429   GLUCOSEU 2+ (A) 09/23/2018 1429   HGBUR NEGATIVE 09/23/2018 1429   BILIRUBINUR NEGATIVE 05/06/2015 1310   KETONESUR NEGATIVE 09/23/2018 1429   PROTEINUR NEGATIVE 09/23/2018 1429   UROBILINOGEN 0.2 05/05/2013 0633   NITRITE NEGATIVE 09/23/2018 1429   LEUKOCYTESUR NEGATIVE 09/23/2018 1429   Sepsis Labs Invalid input(s): PROCALCITONIN,  WBC,  LACTICIDVEN Microbiology No results found for this or any previous visit (from the past 240 hour(s)).  SIGNED:   Charlynne Cousins, MD  [facility] 03/03/2021, 10:45 AM Pager   If 7PM-7AM, please contact night-coverage [email or web address] Password [email or web address]

## 2021-03-07 ENCOUNTER — Encounter (HOSPITAL_COMMUNITY): Payer: Self-pay

## 2021-03-07 ENCOUNTER — Other Ambulatory Visit: Payer: Self-pay

## 2021-03-07 ENCOUNTER — Emergency Department (HOSPITAL_COMMUNITY)
Admission: EM | Admit: 2021-03-07 | Discharge: 2021-03-07 | Disposition: A | Discharge status: Home / Self Care | Payer: Medicare Other | Attending: Emergency Medicine | Admitting: Emergency Medicine

## 2021-03-07 ENCOUNTER — Emergency Department (HOSPITAL_COMMUNITY): Payer: Medicare Other

## 2021-03-07 DIAGNOSIS — Z79899 Other long term (current) drug therapy: Secondary | ICD-10-CM | POA: Diagnosis not present

## 2021-03-07 DIAGNOSIS — Z7982 Long term (current) use of aspirin: Secondary | ICD-10-CM | POA: Diagnosis not present

## 2021-03-07 DIAGNOSIS — Z7901 Long term (current) use of anticoagulants: Secondary | ICD-10-CM | POA: Insufficient documentation

## 2021-03-07 DIAGNOSIS — J441 Chronic obstructive pulmonary disease with (acute) exacerbation: Secondary | ICD-10-CM | POA: Insufficient documentation

## 2021-03-07 DIAGNOSIS — R0602 Shortness of breath: Secondary | ICD-10-CM | POA: Diagnosis present

## 2021-03-07 MED ORDER — IPRATROPIUM-ALBUTEROL 0.5-2.5 (3) MG/3ML IN SOLN
3.0000 mL | Freq: Once | RESPIRATORY_TRACT | Status: AC
  Administered 2021-03-07: 3 mL via RESPIRATORY_TRACT
  Filled 2021-03-07: qty 3

## 2021-03-07 NOTE — ED Notes (Signed)
[facility] present to transport pt back to [facility]. Report given to [facility] ems.  Pt vitally stable and in NAD upon dc. All belongings and paperwork sent w/ pt and [facility] back to [facility].

## 2021-03-07 NOTE — ED Triage Notes (Signed)
Pt biba from [facility].  Pt test Covid + 02/03/21.  Pt on 6L [place] from nursing home. Pt sent by NH staff today for SOB.  Pt 93% on 6L [place].  Pt appears in NO distress upon arrival to ed.

## 2021-03-07 NOTE — ED Notes (Signed)
Report called and given to [facility] and Rehab Staff. Pt awaiting [facility] transport.

## 2021-03-07 NOTE — Discharge Instructions (Signed)
Follow-up with your doctor if any problems 

## 2021-03-07 NOTE — ED Provider Notes (Signed)
[facility] DEPT Provider Note   CSN: [id number] Arrival date & time: 03/07/21  1116     History  Chief Complaint  Patient presents with   Shortness of Breath    Anthony [name]. is a 71 y.o. male.  Patient has COPD and he was sent over here from the nursing home because he had low sats on 6 L.  He is normally on 6 L.  The paramedics stated that his O2 sats were in the 90s on 6 L for them.  He is in the 90s now also on 6 L with no complaint  The history is provided by the patient and medical records. No language interpreter was used.  Shortness of Breath Severity:  Mild Timing:  Constant Progression:  Resolved Chronicity:  Recurrent Context: activity   Relieved by:  Nothing Worsened by:  Nothing Ineffective treatments:  None tried Associated symptoms: no abdominal pain, no chest pain, no cough, no ear pain, no headaches and no rash       Home Medications Prior to Admission medications   Medication Sig Start Date End Date Taking? Authorizing Provider  traZODone (DESYREL) 50 MG tablet TAKE 1 TO 2 TABLETS BY MOUTH AT BEDTIME AS NEEDED FOR SLEEP Patient not taking: Reported on 02/12/2021 06/09/20   [name], DO  albuterol (VENTOLIN HFA) 108 (90 Base) MCG/ACT inhaler Inhale 2 puffs into the lungs in the morning, at noon, and at bedtime.    [provider]  apixaban (ELIQUIS) 5 MG TABS tablet Take 1 tablet (5 mg total) by mouth 2 (two) times daily. 03/03/21   Charlynne Cousins, MD  Ascorbic Acid (VITAMIN C) 1000 MG tablet Take 1,000 mg by mouth daily.    [provider]  aspirin EC 81 MG tablet Take 1 tablet (81 mg total) by mouth daily. 02/25/17   Burnell Blanks, MD  atorvastatin (LIPITOR) 40 MG tablet Take 1 tablet (40 mg total) by mouth daily. 12/22/18   Emeterio Reeve, DO  Baclofen 5 MG TABS Take 5 mg by mouth in the morning and at bedtime.    [provider]  blood glucose meter kit and supplies KIT  Dispense based on patient and insurance preference. Use to check BG once daily or every other other.  Dx: E11.9 07/17/16   [name] A, FNP  Dextromethorphan-guaiFENesin (ROBITUSSIN COUGH+CHEST CONG DM) 10-200 MG CAPS Take 1 capsule by mouth in the morning, at noon, and at bedtime.    [provider]  diltiazem (CARDIZEM CD) 120 MG 24 hr capsule Take 1 capsule (120 mg total) by mouth daily. 03/04/21   Charlynne Cousins, MD  escitalopram (LEXAPRO) 10 MG tablet Take 1 tablet (10 mg total) by mouth at bedtime. 03/03/21   Charlynne Cousins, MD  furosemide (LASIX) 20 MG tablet Take 1 tablet (20 mg total) by mouth every other day. 03/04/21   Charlynne Cousins, MD  gabapentin (NEURONTIN) 100 MG capsule Take 100 mg by mouth 2 (two) times daily.    [provider]  glipiZIDE (GLUCOTROL) 10 MG tablet Take 1 tablet (10 mg total) by mouth 2 (two) times daily before a meal. Patient not taking: Reported on 02/12/2021 12/22/18   Emeterio Reeve, DO  metFORMIN (GLUCOPHAGE-XR) 500 MG 24 hr tablet TAKE 2 TABLETS BY MOUTH  DAILY WITH BREAKFAST Patient taking differently: Take 750 mg by mouth in the morning and at bedtime. 01/18/20   [name], DO  metoprolol succinate (TOPROL-XL) 25  MG 24 hr tablet Take 1 tablet (25 mg total) by mouth daily. 03/03/21   Charlynne Cousins, MD  Semaglutide 7 MG TABS Take 7 mg by mouth daily.    [provider]  sertraline (ZOLOFT) 50 MG tablet Take 1/2 tab (6m) by mouth at bedtime for 6 days then increase to 1 tab (517m by mouth every night at bedtime. Patient not taking: Reported on 02/12/2021 01/22/20   Early, SaCoralee PesaNP  traMADol (ULTRAM) 50 MG tablet TAKE 1 TABLET BY MOUTH EVERY 6 HOURS AS NEEDED FOR SEVERE PAIN Patient not taking: Reported on 02/12/2021 02/24/20   MaLuetta NuttingDO      Allergies    Flomax [tamsulosin hcl] and Penicillins    Review of Systems   Review of Systems  Constitutional:  Negative for appetite change and  fatigue.  HENT:  Negative for congestion, ear discharge, ear pain and sinus pressure.   Eyes:  Negative for discharge.  Respiratory:  Positive for shortness of breath. Negative for cough.   Cardiovascular:  Negative for chest pain.  Gastrointestinal:  Negative for abdominal pain and diarrhea.  Genitourinary:  Negative for frequency and hematuria.  Musculoskeletal:  Negative for back pain.  Skin:  Negative for rash.  Neurological:  Negative for seizures and headaches.  Psychiatric/Behavioral:  Negative for hallucinations.    Physical Exam Updated Vital Signs BP 99/67    Pulse (!) 56    Temp (!) 97.5 F (36.4 C) (Oral)    Resp 16    Ht _0  (1.753 m)    Wt 83 kg    SpO2 92%    BMI 27.02 kg/m  Physical Exam Vitals and nursing note reviewed.  Constitutional:      Appearance: He is well-developed.  HENT:     Head: Normocephalic.     Nose: Nose normal.  Eyes:     General: No scleral icterus.    Conjunctiva/sclera: Conjunctivae normal.  Neck:     Thyroid: No thyromegaly.  Cardiovascular:     Rate and Rhythm: Normal rate and regular rhythm.     Heart sounds: No murmur heard.   No friction rub. No gallop.  Pulmonary:     Breath sounds: No stridor. No wheezing or rales.  Chest:     Chest wall: No tenderness.  Abdominal:     General: There is no distension.     Tenderness: There is no abdominal tenderness. There is no rebound.  Musculoskeletal:        General: Normal range of motion.     Cervical back: Neck supple.  Lymphadenopathy:     Cervical: No cervical adenopathy.  Skin:    Findings: No erythema or rash.  Neurological:     Mental Status: He is alert and oriented to person, place, and time.     Motor: No abnormal muscle tone.     Coordination: Coordination normal.  Psychiatric:        Behavior: Behavior normal.    ED Results / Procedures / Treatments   Labs (all labs ordered are listed, but only abnormal results are displayed) Labs Reviewed - No data to  display  EKG None  [facility]  Result Date: 03/07/2021 CLINICAL DATA:  Shortness of breath, recent history of positive COVID EXAM: PORTABLE CHEST 1 VIEW COMPARISON:  02/20/2021 FINDINGS: Transverse diameter of heart is increased. Central pulmonary vessels are less prominent. Right hemidiaphragm is elevated. There are small linear densities in the lower lung  fields. There is no significant pleural effusion or pneumothorax. IMPRESSION: There is interval decrease in pulmonary vascular congestion. Linear densities in the lower lung fields may suggest scarring or subsegmental atelectasis. Part of this finding may be due to poor inspiration. Electronically Signed   By: Elmer Picker M.D.   On: 03/07/2021 12:22    Procedures Procedures    Medications Ordered in ED Medications  ipratropium-albuterol (DUONEB) 0.5-2.5 (3) MG/3ML nebulizer solution 3 mL (3 mLs Nebulization Given 03/07/21 1229)    ED Course/ Medical Decision Making/ A&P                           Medical Decision Making Amount and/or Complexity of Data Reviewed Radiology: ordered.  Risk Prescription drug management.     This patient presents to the ED for concern of shortness of breath, this involves an extensive number of treatment options, and is a complaint that carries with it a high risk of complications and morbidity.  The differential diagnosis includes COPD, MI   Co morbidities that complicate the patient evaluation  COPD   Additional history obtained:  Additional history obtained from EMS External records from outside source obtained and reviewed including hospital rec   Lab Tests:  I Ordered, and personally interpreted labs.  The pertinent results include: No labs ordered   Imaging Studies ordered:  I ordered imaging studies including chest x-ray I independently visualized and interpreted imaging which showed COPD I agree with the radiologist interpretation   Cardiac  Monitoring:  The patient was maintained on a cardiac monitor.  I personally viewed and interpreted the cardiac monitored which showed an underlying rhythm of: Normal sinus rhythm   Medicines ordered and prescription drug management:  I ordered medication including oxygen for COPD Reevaluation of the patient after these medicines showed that the patient stayed the same I have reviewed the patients home medicines and have made adjustments as needed   Test Considered:  None   Critical Interventions:  None   Consultations Obtained:  No consult  Problem List / ED Course:  COPD    Social Determinants of Health:  Lives in nursing home    Patient with stable O2 sats.  Will be discharged home back to the nursing home        Final Clinical Impression(s) / ED Diagnoses Final diagnoses:  COPD exacerbation [facility])    Rx / DC Orders ED Discharge Orders     None         Milton Ferguson, MD 03/09/21 [phone]

## 2021-03-07 NOTE — ED Notes (Signed)
[facility] called and transport arranged.

## 2021-03-28 ENCOUNTER — Encounter: Payer: Self-pay | Admitting: Emergency Medicine

## 2021-03-28 ENCOUNTER — Ambulatory Visit (INDEPENDENT_AMBULATORY_CARE_PROVIDER_SITE_OTHER): Payer: Medicare Other | Admitting: Emergency Medicine

## 2021-03-28 ENCOUNTER — Other Ambulatory Visit: Payer: Self-pay

## 2021-03-28 DIAGNOSIS — J849 Interstitial pulmonary disease, unspecified: Secondary | ICD-10-CM | POA: Diagnosis not present

## 2021-03-28 DIAGNOSIS — J9601 Acute respiratory failure with hypoxia: Secondary | ICD-10-CM

## 2021-03-28 DIAGNOSIS — R918 Other nonspecific abnormal finding of lung field: Secondary | ICD-10-CM | POA: Diagnosis not present

## 2021-03-28 DIAGNOSIS — J410 Simple chronic bronchitis: Secondary | ICD-10-CM | POA: Diagnosis not present

## 2021-03-28 MED ORDER — STIOLTO RESPIMAT 2.5-2.5 MCG/ACT IN AERS: 2.0000 | INHALATION_SPRAY | Freq: Every day | RESPIRATORY_TRACT | 0 refills | Status: DC

## 2021-03-28 NOTE — Assessment & Plan Note (Signed)
Unclear etiology.  May merit further evaluation going forward including autoimmune labs.  Certainly made worse on his most recent film by active COVID-19.  He may ultimately end up with some progressive scar postinfection.  We will plan to treat his COPD for now, work on further evaluation ILD going forward.  Repeat CT through the lung cancer screening program in February 2024. ?

## 2021-03-28 NOTE — Assessment & Plan Note (Signed)
Unclear severity.  He is not on scheduled bronchodilator therapy.  Unclear whether he could participate in PFTs at this time.  I think a trial Stiolto may offer benefit.  We will order today.  Continue albuterol as needed.  He does not ask for it reliably, would probably benefit from it at least before his physical therapy treatments. ? ?We will try starting a new inhaler called Stiolto.  Use 2 puffs once daily every day on a schedule.  Keep track of whether you get benefit from this medication.  If so we will plan to continue it going forward. ?We may decide to perform pulmonary function testing at some point in the future ?You can ask for albuterol, can use 2 puffs up to every 4 hours if needed for shortness of breath, chest tightness, wheezing.  You might want to take this 15 minutes before exertion or physical therapy. ?Follow with Dr Lamonte Sakai in 3 months or sooner if you have any problems. ?

## 2021-03-28 NOTE — Assessment & Plan Note (Signed)
I reviewed his CT chest.  His nodules are stable going back to 2020 lung cancer screening CT.  Plan to repeat a screening CT in February 2024.  Congratulated him on his smoking cessation. ?

## 2021-03-28 NOTE — Patient Instructions (Signed)
We will try starting a new inhaler called Stiolto.  Use 2 puffs once daily every day on a schedule.  Keep track of whether you get benefit from this medication.  If so we will plan to continue it going forward. ?We may decide to perform pulmonary function testing at some point in the future ?You can ask for albuterol, can use 2 puffs up to every 4 hours if needed for shortness of breath, chest tightness, wheezing.  You might want to take this 15 minutes before exertion or physical therapy. ?We reviewed your CT scan of the chest.  We will get you back into the lung cancer screening program with plans for a CT chest in February 2024. ?Congratulations on stopping smoking. ?Please continue Eliquis as you have been taking it. ?Follow with Dr Lamonte Sakai in 3 months or sooner if you have any problems. ? ?

## 2021-03-28 NOTE — Assessment & Plan Note (Signed)
O2 needs have improved post COVID.  Now using 2 L/min at rest, will need to uptitrate to keep his saturations greater than 90% with exertion. ?

## 2021-03-28 NOTE — Progress Notes (Signed)
? ?Subjective:  ? ? Patient ID: Anthony Sedor., male    DOB: [birth year], 71 y.o.   MRN: [id number] ? ?HPI ?71 year old male with history former tobacco use (65 pack years), atrial fibrillation/flutter, CAD, diabetes, hyperlipidemia, former substance abuse.  Carries a history of COPD that was made .  He is here to discuss he has COPD and also pulmonary nodules on CT chest.  ? ?He was admitted from [facility] (there since admission post left AKA for acute arterial occlusion) to [facility] 2/5 after he was diagnosed with COVID-19 1/27.  Question pulmonary micro emboli at the time of diagnosis.  Started on oxygen 6 L/min, has been able to wean down to 2L/min at rest.  Albuterol is on his medication list, does not appear to be on any scheduled bronchodilator therapy. He is working on getting LLE prosthesis. Denies much SOB. Minimal cough.  ? ?CT-PA 02/12/2021 reviewed by me (from his hospitalization) shows bilateral paraseptal emphysema, peripheral chronic interstitial changes, stable 5 mm right middle lobe pulmonary nodule, stable 3 mm right middle lobe pulmonary nodule, compared with lung cancer screening CT 08/07/2018 ? ? ?Review of Systems ?As per HPI ? ?Past Medical History:  ?Diagnosis Date  ? Anxiety   ? Arthritis   ? Atrial fibrillation ([place])   ? Atrial flutter ([place])   ? Bipolar disorder ([place])   ? CAD (coronary artery disease)   ? a. INF STEMI 07/04/10:  tx with thrombectomy + Vision BMS to [facility];  cath 07/04/10: dLM 10-20%, pLAD 40-50%, mLAD 20-30%, pRCA 30%, mRCA occluded and tx with PCI, EF 50% with inf HK. A Multilink  ? COPD (chronic obstructive pulmonary disease) ([place])   ? Diabetes mellitus ([place])   ? History of DVT (deep vein thrombosis)   ? HLD (hyperlipidemia)   ? Hypertension   ? Left above-knee amputee [facility])   ? PAD (peripheral artery disease) ([place])   ? Polysubstance abuse ([place])   ? Prior hx  ? Tobacco abuse   ? Urinary frequency   ? Vitamin D deficiency 09/05/2018  ?  ? ?Family History  ?Problem Relation Age  of Onset  ? Coronary artery disease Father   ? Depression Father   ? Heart attack Father   ? Depression Mother   ?  ? ?Social History  ? ?Socioeconomic History  ? Marital status: Divorced  ?  Spouse name: Not on file  ? Number of children: Not on file  ? Years of education: Not on file  ? Highest education level: Not on file  ?Occupational History  ? Not on file  ?Tobacco Use  ? Smoking status: Light Smoker  ?  Packs/day: 1.50  ?  Years: 45.00  ?  Pack years: 67.50  ?  Types: Cigarettes  ? Smokeless tobacco: Never  ?Vaping Use  ? Vaping Use: Never used  ?Substance and Sexual Activity  ? Alcohol use: No  ?  Comment: past hx of ETOH use was in inpt facility per court order  - 20 years, 10-18-2015 per pt not anymore,per pt stopped about 15-20 yrs ago  ? Drug use: No  ?  Types: Marijuana, Heroin, Cocaine  ?  Comment: snorted heroin and cocaine, 10-18-2015 per pt in the past he did Marijuana only and nothing else  ? Sexual activity: Not Currently  ?  Partners: Female  ?Other Topics Concern  ? Not on file  ?Social History Narrative  ? Lives in [place]. He lives alone. He has kids but is  not married.   ? ?Social Determinants of Health  ? ?Financial Resource Strain: Not on file  ?Food Insecurity: Not on file  ?Transportation Needs: Not on file  ?Physical Activity: Not on file  ?Stress: Not on file  ?Social Connections: Not on file  ?Intimate Partner Violence: Not on file  ?  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 ?Allergies  ?Allergen Reactions  ? Flomax [Tamsulosin Hcl] Other (See Comments)  ?  This medication makes patient feel sick  ? Penicillins Other (See Comments)  ?  Reaction unknown occurred during childhood ?Has patient had a PCN reaction causing immediate rash, facial/tongue/throat swelling, SOB or lightheadedness with hypotension: unsure - childhood reaction ?Has patient had a PCN reaction causing severe rash involving mucus membranes or skin  necrosis: unsure - childhood reaction ?Has patient had a PCN reaction that required hospitalization no ?Has patient had a PCN reaction occurring within the last 10 years: no ?If all of the above answers are "NO", then may p  ?  ? ?Outpatient Medications Prior to Visit  ?Medication Sig Dispense Refill  ? albuterol (VENTOLIN HFA) 108 (90 Base) MCG/ACT inhaler Inhale 2 puffs into the lungs in the morning, at noon, and at bedtime.    ? apixaban (ELIQUIS) 5 MG TABS tablet Take 1 tablet (5 mg total) by mouth 2 (two) times daily. 60 tablet   ? Ascorbic Acid (VITAMIN C) 1000 MG tablet Take 1,000 mg by mouth daily.    ? aspirin EC 81 MG tablet Take 1 tablet (81 mg total) by mouth daily. 90 tablet 3  ? atorvastatin (LIPITOR) 40 MG tablet Take 1 tablet (40 mg total) by mouth daily. 90 tablet 3  ? Baclofen 5 MG TABS Take 5 mg by mouth in the morning and at bedtime.    ? blood glucose meter kit and supplies KIT Dispense based on patient and insurance preference. Use to check BG once daily or every other other.  Dx: E11.9 1 each 0  ? Dextromethorphan-guaiFENesin (ROBITUSSIN COUGH+CHEST CONG DM) 10-200 MG CAPS Take 1 capsule by mouth in the morning, at noon, and at bedtime.    ? diltiazem (CARDIZEM CD) 120 MG 24 hr capsule Take 1 capsule (120 mg total) by mouth daily.    ? escitalopram (LEXAPRO) 10 MG tablet Take 1 tablet (10 mg total) by mouth at bedtime.    ? furosemide (LASIX) 20 MG tablet Take 1 tablet (20 mg total) by mouth every other day. 30 tablet   ? gabapentin (NEURONTIN) 100 MG capsule Take 100 mg by mouth 2 (two) times daily.    ? glipiZIDE (GLUCOTROL) 10 MG tablet Take 1 tablet (10 mg total) by mouth 2 (two) times daily before a meal. 180 tablet 3  ? metFORMIN (GLUCOPHAGE-XR) 500 MG 24 hr tablet TAKE 2 TABLETS BY MOUTH  DAILY WITH BREAKFAST (Patient taking differently: Take 750 mg by mouth in the morning and at bedtime.) 180 tablet 3  ? metoprolol succinate (TOPROL-XL) 25 MG 24 hr tablet Take 1 tablet (25 mg total) by  mouth daily. 45 tablet 3  ? Semaglutide 7 MG TABS Take 7 mg by mouth daily.    ? sertraline (ZOLOFT) 50 MG tablet Take 1/2 tab (3m) by mouth at bedtime for 6 days then increase to 1 tab (523m by mouth every night at bedtime. 30 tablet 3  ? traMADol (ULTRAM) 50 MG tablet TAKE 1 TABLET BY MOUTH  EVERY 6 HOURS AS NEEDED FOR SEVERE PAIN 60 tablet 1  ? traZODone (DESYREL) 50 MG tablet TAKE 1 TO 2 TABLETS BY MOUTH AT BEDTIME AS NEEDED FOR SLEEP 90 tablet 0  ? ?No facility-administered medications prior to visit.  ? ? ? ? ?   ?Objective:  ? Physical Exam ?Vitals:  ? 03/28/21 1029  ?BP: (!) 96/56  ?Pulse: 63  ?Temp: 97.6 ?F (36.4 ?C)  ?TempSrc: Oral  ?SpO2: 94%  ?Weight: 175 lb (79.4 kg)  ?Height: 5' 8"  (1.727 m)  ? ? ?Gen: Pleasant, well-nourished, in no distress,  normal affect ? ?ENT: No lesions,  mouth clear,  oropharynx clear, no postnasal drip ? ?Neck: No JVD, no stridor ? ?Lungs: No use of accessory muscles, no crackles or wheezing on normal respiration, no wheeze on forced expiration ? ?Cardiovascular: RRR, heart sounds normal, no murmur or gallops, no peripheral edema ? ?Musculoskeletal: No deformities, no cyanosis or clubbing ? ?Neuro: alert, awake, non focal ? ?Skin: Warm, no lesions or rash ? ? ?   ?Assessment & Plan:  ?COPD (chronic obstructive pulmonary disease) ([place]) ?Unclear severity.  He is not on scheduled bronchodilator therapy.  Unclear whether he could participate in PFTs at this time.  I think a trial Stiolto may offer benefit.  We will order today.  Continue albuterol as needed.  He does not ask for it reliably, would probably benefit from it at least before his physical therapy treatments. ? ?We will try starting a new inhaler called Stiolto.  Use 2 puffs once daily every day on a schedule.  Keep track of whether you get benefit from this medication.  If so we will plan to continue it going forward. ?We may decide to perform pulmonary function testing at some point in the future ?You can ask for  albuterol, can use 2 puffs up to every 4 hours if needed for shortness of breath, chest tightness, wheezing.  You might want to take this 15 minutes before exertion or physical therapy. ?Follow with Dr

## 2021-03-28 NOTE — Addendum Note (Signed)
Addended by: Fran Lowes on: 03/28/2021 12:55 PM ? ? Modules accepted: Orders ? ?

## 2021-06-07 NOTE — Progress Notes (Signed)
Cardiology Office Note    Date:  06/20/2021   ID:  Anthony Form., DOB November 12, 1950, MRN [id number]   PCP:  [name], [facility]  Cardiologist:  Lauree Chandler, MD   Advanced Practice Provider:  No care team member to display Electrophysiologist:  None   [id number]}   Chief Complaint  Patient presents with   Follow-up    History of Present Illness:  Anthony Hendricksen. is a 71 y.o. male with a history of CAD status post inferior STEMI 06/2010 treated with BMS to the mid RCA.  He had moderate nonobstructive disease in the LAD, inferior hypokinesis EF 50%.  Has a history of recurrent DVT in 2008 & 2014 treated with Xarelto, COPD, tobacco abuse, HLD, bipolar disorder.   I last had a telemedicine visit with the patient 04/15/18 and was doing well.  Patient comes in for f/u. Has had above the knee amputation on the left 09/21/20 for lower leg ischemia Left femoral endarterectomy, thrombectomy, and arteriogram complicated by PAF/flutter . In a wheelchair today but does have a prosthesis he uses at home. Denies chest pain, dyspnea, quit smoking 8-9 months ago.Living at [facility] place and poor historian. We saw in the hospital 02/2021 with hypoxic respriatory failure in setting of covid19, PAF with RVR controlled with metoprolol(not good candidate for amio), chronic HFpEF diuresed 15 L. EKG reviewed 02/20/21 and was in rapid Afib now on eliquis.    Past Medical History:  Diagnosis Date   Anxiety    Arthritis    Atrial fibrillation (HCC)    Atrial flutter (HCC)    Bipolar disorder (HCC)    CAD (coronary artery disease)    a. INF STEMI 07/04/10:  tx with thrombectomy + Vision BMS to [facility];  cath 07/04/10: dLM 10-20%, pLAD 40-50%, mLAD 20-30%, pRCA 30%, mRCA occluded and tx with PCI, EF 50% with inf HK. A Multilink   COPD (chronic obstructive pulmonary disease) (HCC)    Diabetes mellitus ([place])    History of DVT (deep vein thrombosis)    HLD  (hyperlipidemia)    Hypertension    Left above-knee amputee (HCC)    PAD (peripheral artery disease) (HCC)    Polysubstance abuse ([place])    Prior hx   Tobacco abuse    Urinary frequency    Vitamin D deficiency 09/05/2018    Past Surgical History:  Procedure Laterality Date   CARDIAC CATHETERIZATION     COLONOSCOPY N/A 12/29/2015   Procedure: COLONOSCOPY;  Surgeon: Rogene Houston, MD;  Location: [facility];  Service: Endoscopy;  Laterality: N/A;  12:55   ELECTROCARDIOGRAM     Showed inferolateral ST-elevation and a code STEMI was activated. In the ER, he was treated with morphine, herarin, and 600 mg of Plavix. He was transferred emergently to [facility].    INGUINAL HERNIA REPAIR Left 07/26/2014   Procedure: OPEN REPAIR OF RECURENT LEFT INGUINAL HERNIA REPAIR WITH MESH;  Surgeon: Greer Pickerel, MD;  Location: [facility] ORS;  Service: General;  Laterality: Left;   INSERTION OF MESH Bilateral 10/30/2013   Procedure: INSERTION OF MESH;  Surgeon: Gayland Curry, MD;  Location: [facility] ORS;  Service: General;  Laterality: Bilateral;   LAPAROSCOPIC INGUINAL HERNIA WITH UMBILICAL HERNIA Bilateral 10/30/2013   Procedure: laparoscopic repair left pantaloom hernia with mesh, laparoscopic right inguinal hernia with mesh, OPEN REPAIR OF UMBILICAL HERNIA ;  Surgeon: Gayland Curry, MD;  Location: [facility] ORS;  Service:  General;  Laterality: Bilateral;   LAPAROSCOPY N/A 07/26/2014   Procedure: LAPAROSCOPY DIAGNOSTIC;  Surgeon: Greer Pickerel, MD;  Location: [facility];  Service: General;  Laterality: N/A;   POLYPECTOMY  12/29/2015   Procedure: POLYPECTOMY;  Surgeon: Rogene Houston, MD;  Location: [facility];  Service: Endoscopy;;  ascending colon, descending colon   stent placement     TOTAL HIP ARTHROPLASTY Right 05/10/2015   Procedure: RIGHT TOTAL HIP ARTHROPLASTY ANTERIOR APPROACH;  Surgeon: Paralee Cancel, MD;  Location: [facility] ORS;  Service: Orthopedics;  Laterality: Right;   VASECTOMY      Current  Medications: Current Meds  Medication Sig   albuterol (VENTOLIN HFA) 108 (90 Base) MCG/ACT inhaler Inhale 2 puffs into the lungs in the morning, at noon, and at bedtime.   apixaban (ELIQUIS) 5 MG TABS tablet Take 1 tablet (5 mg total) by mouth 2 (two) times daily.   Ascorbic Acid (VITAMIN C) 1000 MG tablet Take 1,000 mg by mouth daily.   aspirin EC 81 MG tablet Take 1 tablet (81 mg total) by mouth daily.   atorvastatin (LIPITOR) 40 MG tablet Take 1 tablet (40 mg total) by mouth daily.   Baclofen 5 MG TABS Take 5 mg by mouth in the morning and at bedtime.   blood glucose meter kit and supplies KIT Dispense based on patient and insurance preference. Use to check BG once daily or every other other.  Dx: E11.9   Dextromethorphan-guaiFENesin (ROBITUSSIN COUGH+CHEST CONG DM) 10-200 MG CAPS Take 1 capsule by mouth in the morning, at noon, and at bedtime.   diltiazem (CARDIZEM CD) 120 MG 24 hr capsule Take 1 capsule (120 mg total) by mouth daily.   escitalopram (LEXAPRO) 10 MG tablet Take 1 tablet (10 mg total) by mouth at bedtime.   furosemide (LASIX) 20 MG tablet Take 1 tablet (20 mg total) by mouth every other day.   gabapentin (NEURONTIN) 100 MG capsule Take 100 mg by mouth 2 (two) times daily.   glipiZIDE (GLUCOTROL) 10 MG tablet Take 1 tablet (10 mg total) by mouth 2 (two) times daily before a meal.   metFORMIN (GLUCOPHAGE-XR) 500 MG 24 hr tablet TAKE 2 TABLETS BY MOUTH  DAILY WITH BREAKFAST (Patient taking differently: Take 750 mg by mouth in the morning and at bedtime.)   metoprolol succinate (TOPROL-XL) 25 MG 24 hr tablet Take 1 tablet (25 mg total) by mouth daily.   Semaglutide 7 MG TABS Take 7 mg by mouth daily.   sertraline (ZOLOFT) 50 MG tablet Take 1/2 tab (75m) by mouth at bedtime for 6 days then increase to 1 tab (574m by mouth every night at bedtime.   Tiotropium Bromide-Olodaterol (STIOLTO RESPIMAT) 2.5-2.5 MCG/ACT AERS Inhale 2 puffs into the lungs daily.   traMADol (ULTRAM) 50 MG  tablet TAKE 1 TABLET BY MOUTH EVERY 6 HOURS AS NEEDED FOR SEVERE PAIN   traZODone (DESYREL) 50 MG tablet TAKE 1 TO 2 TABLETS BY MOUTH AT BEDTIME AS NEEDED FOR SLEEP     Allergies:   Flomax [tamsulosin hcl] and Penicillins   Social History   Socioeconomic History   Marital status: Divorced    Spouse name: Not on file   Number of children: Not on file   Years of education: Not on file   Highest education level: Not on file  Occupational History   Not on file  Tobacco Use   Smoking status: Light Smoker    Packs/day: 1.50    Years: 45.00    Total  pack years: 67.50    Types: Cigarettes   Smokeless tobacco: Never  Vaping Use   Vaping Use: Never used  Substance and Sexual Activity   Alcohol use: No    Comment: past hx of ETOH use was in inpt facility per court order  - 20 years, 10-18-2015 per pt not anymore,per pt stopped about 15-20 yrs ago   Drug use: No    Types: Marijuana, Heroin, Cocaine    Comment: snorted heroin and cocaine, 10-18-2015 per pt in the past he did Marijuana only and nothing else   Sexual activity: Not Currently    Partners: Female  Other Topics Concern   Not on file  Social History Narrative   Lives in [name]. He lives alone. He has kids but is not married.    Social Determinants of Health   Financial Resource Strain: Not on file  Food Insecurity: Not on file  Transportation Needs: Not on file  Physical Activity: Not on file  Stress: Not on file  Social Connections: Not on file     Family History:  The patient's  family history includes Coronary artery disease in his father; Depression in his father and mother; Heart attack in his father.   ROS:   Please see the history of present illness.    ROS All other systems reviewed and are negative.   PHYSICAL EXAM:   VS:  BP 138/80   Pulse 61   Ht 5' 8"  (1.727 m)   Wt 175 lb (79.4 kg)   SpO2 92%   BMI 26.61 kg/m   Physical Exam  GEN: Well nourished, well developed, in no acute distress  Neck:  no JVD, carotid bruits, or masses Cardiac:RRR; no murmurs, rubs, or gallops  Respiratory:  clear to auscultation bilaterally, normal work of breathing GI: soft, nontender, nondistended, + BS Ext: above knee amputation on L decrease pulses on right Neuro:  Alert and Oriented x 3, Psych: euthymic mood, full affect  Wt Readings from Last 3 Encounters:  06/20/21 175 lb (79.4 kg)  03/28/21 175 lb (79.4 kg)  03/07/21 182 lb 15.7 oz (83 kg)      Studies/Labs Reviewed:   EKG:  EKG is not ordered today.     Recent Labs: 02/17/2021: ALT 24 02/22/2021: TSH 1.708 02/26/2021: B Natriuretic Peptide 45.1 02/27/2021: Hemoglobin 15.3; Platelets 188 03/01/2021: BUN 20; Creatinine, Ser 0.73; Magnesium 2.1; Potassium 3.6; Sodium 136   Lipid Panel    Component Value Date/Time   CHOL 105 02/22/2021 0329   CHOL 239 (H) 02/15/2017 1617   TRIG 89 02/22/2021 0329   HDL 38 (L) 02/22/2021 0329   HDL 47 02/15/2017 1617   CHOLHDL 2.8 02/22/2021 0329   VLDL 18 02/22/2021 0329   LDLCALC 49 02/22/2021 0329   LDLCALC 80 09/23/2018 1429   LDLDIRECT 106 (H) 10/30/2019 1040    Additional studies/ records that were reviewed today include:  Echo 02/21/21 IMPRESSIONS     1. Non-diagnostic bubble study despite 3 attempts. Would recommend TCDs  or TEE if clinically indicated.   2. Left ventricular ejection fraction, by estimation, is 60 to 65%. The  left ventricle has normal function. The left ventricle has no regional  wall motion abnormalities. Left ventricular diastolic parameters were  normal.   3. Right ventricular systolic function is normal. The right ventricular  size is normal. Tricuspid regurgitation signal is inadequate for assessing  PA pressure.   4. The mitral valve is grossly normal. No evidence of mitral valve  regurgitation. No evidence of mitral stenosis.   5. The aortic valve is tricuspid. There is mild calcification of the  aortic valve. Aortic valve regurgitation is not visualized. No  aortic  stenosis is present.   6. The inferior vena cava is normal in size with greater than 50%  respiratory variability, suggesting right atrial pressure of 3 mmHg.   Conclusion(s)/Recommendation(s): No intracardiac source of embolism  detected on this transthoracic study. Consider a transesophageal  echocardiogram to exclude cardiac source of embolism if clinically  indicated.   CT chest 02/11/18 IMPRESSION: 1. There is moderate emphysema, predominantly in a paraseptal pattern. At the bilateral lung bases, there is subtle peripheral interstitial opacity and bronchiolectasis. 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 There is no acute appearing airspace opacity.   2. Multiple small bilateral pulmonary nodules measuring up to 6 mm. Recommend initial follow-up CT at 3-6 months and subsequently at 18-24 months to establish stability.   3.  Coronary artery disease.     Electronically Signed   By: Eddie Candle M.D.   On: 02/11/2018 15:18   Cardiovascular: Three-vessel coronary artery calcifications. Normal heart size. No pericardial effusion.    Cardiac cath 6/2012IMPRESSION: 1. Acute ST-segment elevation inferior wall myocardial infarction     secondary to total occlusion of the mid right coronary artery. 2. Transient bradycardia to heart rate down to 38 requiring temporary     pacemaker insertion. 3. Hypotension requiring inotropic support with dopamine. 4. Mild 10-20% distal tapering of the left main, with 40-50% proximal     left anterior descending stenoses and luminal irregularities of 20-     30% in the mid segment of the left anterior descending with initial     left-to-right collateralization to the posterolateral artery branch     of the RCA. 5. Normal left circumflex coronary artery. 6. Total occlusion of the mid right coronary artery with initial TIMI     0 flow. 7. Successful percutaneous coronary intervention requiring initial      thrombectomy, percutaneous transluminal coronary angioplasty and     ultimate stenting with a 3.5 x 18-mm bare-metal Vision stent,     postdilated 3.91 mm. 8. Because of haziness and clot during the procedure, the patient was     also started on Integrilin in addition to bivalirudin and had     received 600 mg oral Plavix prior to transfer to [place]:  39 minutes.       Risk Assessment/Calculations:    CHA2DS2-VASc Score = 6   This indicates a 9.7% annual risk of stroke. The patient's score is based upon: CHF History: 0 HTN History: 1 Diabetes History: 1 Stroke History: 2 (recurrent VTE) Vascular Disease History: 1 Age Score: 1 Gender Score: 0        ASSESSMENT:    1. Coronary artery disease involving native coronary artery of native heart without angina pectoris   2. Paroxysmal atrial fibrillation with RVR ([place])   3. Chronic diastolic CHF (congestive heart failure) ([place])   4. Essential hypertension   5. Hyperlipidemia, unspecified hyperlipidemia type   6. Tobacco abuse   7. Type 2 diabetes mellitus with hyperosmolarity without coma, without long-term current use of insulin ([place])   8. S/P AKA (above knee amputation), left (HCC)      PLAN:  In order of problems listed above:  CAD S/P inf STEMI 2012 BMS RCA with moderateate non-obstructive disease in LAD- no angina.  Continue ASA & Lipitor  PAF rate controlled on metoprolol and eliquis-asymptomatic on DOAC and ASA due to severe thrombotic and vascular disease hgb stable no changes at this time -no bleeding problems.   Chronic diastolic CHF-diuresed 15 L in Feb admission but compensated today.  Essential Htn well controlled  Hyperlipidemia LDL 49 02/2021  Tobacco abuse- quit smoking 9 months ago  Diabetes Hbg A1C 7.7 in Feb  PAD with Left AKA 09/2020    Shared Decision Making/Informed Consent        Medication Adjustments/Labs and Tests Ordered: Current  medicines are reviewed at length with the patient today.  Concerns regarding medicines are outlined above.  Medication changes, Labs and Tests ordered today are listed in the Patient Instructions below. Patient Instructions  Medication Instructions:  Your physician recommends that you continue on your current medications as directed. Please refer to the Current Medication list given to you today.  *If you need a refill on your cardiac medications before your next appointment, please call your pharmacy*   Lab Work: TODAY: BMET, CBC  If you have labs (blood work) drawn today and your tests are completely normal, you will receive your results only by: [place] (if you have MyChart) OR A paper copy in the mail If you have any lab test that is abnormal or we need to change your treatment, we will call you to review the results.   Follow-Up: At [facility], you and your health needs are our priority.  As part of our continuing mission to provide you with exceptional heart care, we have created designated Provider Care Teams.  These Care Teams include your primary Cardiologist (physician) and Advanced Practice Providers (APPs -  Physician Assistants and Nurse Practitioners) who all work together to provide you with the care you need, when you need it.  We recommend signing up for the patient portal called "MyChart".  Sign up information is provided on this After Visit Summary.  MyChart is used to connect with patients for Virtual Visits (Telemedicine).  Patients are able to view lab/test results, encounter notes, upcoming appointments, etc.  Non-urgent messages can be sent to your provider as well.   To learn more about what you can do with MyChart, go to [email or web address].    Your next appointment:   6 month(s)  The format for your next appointment:   In Person  Provider:   Lauree Chandler, MD {     Signed, Ermalinda Barrios, PA-C  06/20/2021 11:12 AM    [facility] [address], [place], [place]  [address] Phone: [phone]; Fax: [phone]

## 2021-06-20 ENCOUNTER — Encounter: Payer: Self-pay | Admitting: Physician Assistant

## 2021-06-20 ENCOUNTER — Ambulatory Visit (INDEPENDENT_AMBULATORY_CARE_PROVIDER_SITE_OTHER): Payer: Medicare Other | Admitting: Physician Assistant

## 2021-06-20 VITALS — BP 138/80 | HR 61 | Ht 68.0 in | Wt 175.0 lb

## 2021-06-20 DIAGNOSIS — I480 Paroxysmal atrial fibrillation: Secondary | ICD-10-CM | POA: Diagnosis not present

## 2021-06-20 DIAGNOSIS — I10 Essential (primary) hypertension: Secondary | ICD-10-CM

## 2021-06-20 DIAGNOSIS — I5032 Chronic diastolic (congestive) heart failure: Secondary | ICD-10-CM

## 2021-06-20 DIAGNOSIS — Z720 Tobacco use: Secondary | ICD-10-CM

## 2021-06-20 DIAGNOSIS — I2510 Atherosclerotic heart disease of native coronary artery without angina pectoris: Secondary | ICD-10-CM | POA: Diagnosis not present

## 2021-06-20 DIAGNOSIS — E785 Hyperlipidemia, unspecified: Secondary | ICD-10-CM

## 2021-06-20 DIAGNOSIS — E1100 Type 2 diabetes mellitus with hyperosmolarity without nonketotic hyperglycemic-hyperosmolar coma (NKHHC): Secondary | ICD-10-CM

## 2021-06-20 DIAGNOSIS — Z89612 Acquired absence of left leg above knee: Secondary | ICD-10-CM

## 2021-06-20 LAB — BASIC METABOLIC PANEL
BUN/Creatinine Ratio: 16 (ref 10–24)
BUN: 13 mg/dL (ref 8–27)
CO2: 23 mmol/L (ref 20–29)
Calcium: 9.6 mg/dL (ref 8.6–10.2)
Chloride: 102 mmol/L (ref 96–106)
Creatinine, Ser: 0.82 mg/dL (ref 0.76–1.27)
Glucose: 141 mg/dL — ABNORMAL HIGH (ref 70–99)
Potassium: 4.5 mmol/L (ref 3.5–5.2)
Sodium: 139 mmol/L (ref 134–144)
eGFR: 93 mL/min/{1.73_m2} (ref >59)

## 2021-06-20 LAB — CBC
Hematocrit: 50.3 % (ref 37.5–51.0)
Hemoglobin: 17.4 g/dL (ref 13.0–17.7)
MCH: 30.3 pg (ref 26.6–33.0)
MCHC: 34.6 g/dL (ref 31.5–35.7)
MCV: 88 fL (ref 79–97)
Platelets: 210 10*3/uL (ref 150–450)
RBC: 5.75 x10E6/uL (ref 4.14–5.80)
RDW: 12 % (ref 11.6–15.4)
WBC: 7.9 10*3/uL (ref 3.4–10.8)

## 2021-06-20 NOTE — Patient Instructions (Signed)
Medication Instructions:  Your physician recommends that you continue on your current medications as directed. Please refer to the Current Medication list given to you today.  *If you need a refill on your cardiac medications before your next appointment, please call your pharmacy*   Lab Work: TODAY: BMET, CBC  If you have labs (blood work) drawn today and your tests are completely normal, you will receive your results only by: [place] (if you have MyChart) OR A paper copy in the mail If you have any lab test that is abnormal or we need to change your treatment, we will call you to review the results.   Follow-Up: At [facility], you and your health needs are our priority.  As part of our continuing mission to provide you with exceptional heart care, we have created designated Provider Care Teams.  These Care Teams include your primary Cardiologist (physician) and Advanced Practice Providers (APPs -  Physician Assistants and Nurse Practitioners) who all work together to provide you with the care you need, when you need it.  We recommend signing up for the patient portal called "MyChart".  Sign up information is provided on this After Visit Summary.  MyChart is used to connect with patients for Virtual Visits (Telemedicine).  Patients are able to view lab/test results, encounter notes, upcoming appointments, etc.  Non-urgent messages can be sent to your provider as well.   To learn more about what you can do with MyChart, go to [email or web address].    Your next appointment:   6 month(s)  The format for your next appointment:   In Person  Provider:   Lauree Chandler, MD {

## 2021-07-04 ENCOUNTER — Ambulatory Visit (INDEPENDENT_AMBULATORY_CARE_PROVIDER_SITE_OTHER): Payer: Medicare Other | Admitting: Emergency Medicine

## 2021-07-04 ENCOUNTER — Encounter: Payer: Self-pay | Admitting: Emergency Medicine

## 2021-07-04 DIAGNOSIS — J410 Simple chronic bronchitis: Secondary | ICD-10-CM | POA: Diagnosis not present

## 2021-07-04 DIAGNOSIS — J849 Interstitial pulmonary disease, unspecified: Secondary | ICD-10-CM | POA: Diagnosis not present

## 2021-07-04 DIAGNOSIS — J9611 Chronic respiratory failure with hypoxia: Secondary | ICD-10-CM | POA: Diagnosis not present

## 2021-07-04 DIAGNOSIS — R918 Other nonspecific abnormal finding of lung field: Secondary | ICD-10-CM | POA: Diagnosis not present

## 2021-12-13 ENCOUNTER — Encounter: Payer: Self-pay | Admitting: Cardiovascular Disease

## 2021-12-13 ENCOUNTER — Ambulatory Visit: Payer: Medicare Other | Attending: Cardiovascular Disease | Admitting: Cardiovascular Disease

## 2021-12-13 VITALS — BP 110/70 | HR 71 | Ht 68.0 in | Wt 203.0 lb

## 2021-12-13 DIAGNOSIS — I10 Essential (primary) hypertension: Secondary | ICD-10-CM

## 2021-12-13 DIAGNOSIS — E785 Hyperlipidemia, unspecified: Secondary | ICD-10-CM | POA: Diagnosis present

## 2021-12-13 DIAGNOSIS — I5032 Chronic diastolic (congestive) heart failure: Secondary | ICD-10-CM

## 2021-12-13 DIAGNOSIS — I2510 Atherosclerotic heart disease of native coronary artery without angina pectoris: Secondary | ICD-10-CM

## 2021-12-13 DIAGNOSIS — Z720 Tobacco use: Secondary | ICD-10-CM

## 2021-12-13 DIAGNOSIS — I480 Paroxysmal atrial fibrillation: Secondary | ICD-10-CM

## 2021-12-13 NOTE — Patient Instructions (Signed)
Medication Instructions:  No changes *If you need a refill on your cardiac medications before your next appointment, please call your pharmacy*   Lab Work: none If you have labs (blood work) drawn today and your tests are completely normal, you will receive your results only by: [place] (if you have MyChart) OR A paper copy in the mail If you have any lab test that is abnormal or we need to change your treatment, we will call you to review the results.   Testing/Procedures: none   Follow-Up: At [facility], you and your health needs are our priority.  As part of our continuing mission to provide you with exceptional heart care, we have created designated Provider Care Teams.  These Care Teams include your primary Cardiologist (physician) and Advanced Practice Providers (APPs -  Physician Assistants and Nurse Practitioners) who all work together to provide you with the care you need, when you need it.  We recommend signing up for the patient portal called "MyChart".  Sign up information is provided on this After Visit Summary.  MyChart is used to connect with patients for Virtual Visits (Telemedicine).  Patients are able to view lab/test results, encounter notes, upcoming appointments, etc.  Non-urgent messages can be sent to your provider as well.   To learn more about what you can do with MyChart, go to [email or web address].    Your next appointment:   12 month(s)  The format for your next appointment:   In Person  Provider:   Lauree Chandler, MD      Important Information About Sugar

## 2021-12-13 NOTE — Progress Notes (Signed)
Chief Complaint  Patient presents with   Follow-up    CAD, atrial fibrillation     History of Present Illness: 71 yo male with history of CAD, HLD, tobacco abuse, bipolar disorder, COPD, DVT 2008 here today for cardiac follow up. He presented to [facility] in June 2012  with an inferior ST elevation myocardial infarction. Cardiac catheterization demonstrated a totally occluded mid RCA. This was treated with a Vision bare metal stent. He had moderate non-obstructive disease in the LAD.  Left ventriculogram demonstrated inferior hypokinesis with an EF of 50%. He had left above the knee amputation in September 2022 secondary to lower leg ischemia. He had atrial fib/flutter while admitted for vascular procedures. He was admitted to [facility] in February 2023 with hypoxic respiratory failure in the setting of Covid infection and had atrial fib with RVR. He was seen by the cardiology team and was diuresed. He was discharged on Eliquis and metoprolol.   He is here today for follow up. The patient denies any chest pain, dyspnea, palpitations, lower extremity edema, orthopnea, PND, dizziness, near syncope or syncope. He is feeling well.   Primary Care Physician: [name], DO   Past Medical History:  Diagnosis Date   Anxiety    Arthritis    Atrial fibrillation [facility])    Atrial flutter ([place])    Bipolar disorder (HCC)    CAD (coronary artery disease)    a. INF STEMI 07/04/10:  tx with thrombectomy + Vision BMS to [facility];  cath 07/04/10: dLM 10-20%, pLAD 40-50%, mLAD 20-30%, pRCA 30%, mRCA occluded and tx with PCI, EF 50% with inf HK. A Multilink   COPD (chronic obstructive pulmonary disease) (HCC)    Diabetes mellitus ([place])    History of DVT (deep vein thrombosis)    HLD (hyperlipidemia)    Hypertension    Left above-knee amputee (HCC)    PAD (peripheral artery disease) (HCC)    Polysubstance abuse ([place])    Prior hx   Tobacco abuse    Urinary frequency    Vitamin D deficiency  09/05/2018    Past Surgical History:  Procedure Laterality Date   CARDIAC CATHETERIZATION     COLONOSCOPY N/A 12/29/2015   Procedure: COLONOSCOPY;  Surgeon: Rogene Houston, MD;  Location: [facility];  Service: Endoscopy;  Laterality: N/A;  12:55   ELECTROCARDIOGRAM     Showed inferolateral ST-elevation and a code STEMI was activated. In the ER, he was treated with morphine, herarin, and 600 mg of Plavix. He was transferred emergently to [facility].    INGUINAL HERNIA REPAIR Left 07/26/2014   Procedure: OPEN REPAIR OF RECURENT LEFT INGUINAL HERNIA REPAIR WITH MESH;  Surgeon: Greer Pickerel, MD;  Location: [facility] ORS;  Service: General;  Laterality: Left;   INSERTION OF MESH Bilateral 10/30/2013   Procedure: INSERTION OF MESH;  Surgeon: Gayland Curry, MD;  Location: [facility] ORS;  Service: General;  Laterality: Bilateral;   LAPAROSCOPIC INGUINAL HERNIA WITH UMBILICAL HERNIA Bilateral 10/30/2013   Procedure: laparoscopic repair left pantaloom hernia with mesh, laparoscopic right inguinal hernia with mesh, OPEN REPAIR OF UMBILICAL HERNIA ;  Surgeon: Gayland Curry, MD;  Location: [facility] ORS;  Service: General;  Laterality: Bilateral;   LAPAROSCOPY N/A 07/26/2014   Procedure: LAPAROSCOPY DIAGNOSTIC;  Surgeon: Greer Pickerel, MD;  Location: [facility] ORS;  Service: General;  Laterality: N/A;   POLYPECTOMY  12/29/2015   Procedure: POLYPECTOMY;  Surgeon: Rogene Houston, MD;  Location: [facility];  Service: Endoscopy;;  ascending colon, descending colon   stent placement     TOTAL HIP ARTHROPLASTY Right 05/10/2015   Procedure: RIGHT TOTAL HIP ARTHROPLASTY ANTERIOR APPROACH;  Surgeon: Paralee Cancel, MD;  Location: [facility];  Service: Orthopedics;  Laterality: Right;   VASECTOMY      Current Outpatient Medications  Medication Sig Dispense Refill   albuterol (VENTOLIN HFA) 108 (90 Base) MCG/ACT inhaler Inhale 2 puffs into the lungs in the morning, at noon, and at bedtime.     apixaban (ELIQUIS) 5 MG TABS tablet Take  1 tablet (5 mg total) by mouth 2 (two) times daily. 60 tablet    atorvastatin (LIPITOR) 40 MG tablet Take 1 tablet (40 mg total) by mouth daily. 90 tablet 3   Baclofen 5 MG TABS Take 5 mg by mouth in the morning and at bedtime.     blood glucose meter kit and supplies KIT Dispense based on patient and insurance preference. Use to check BG once daily or every other other.  Dx: E11.9 1 each 0   Dextromethorphan-guaiFENesin (ROBITUSSIN COUGH+CHEST CONG DM) 10-200 MG CAPS Take 1 capsule by mouth in the morning, at noon, and at bedtime.     escitalopram (LEXAPRO) 10 MG tablet Take 1 tablet (10 mg total) by mouth at bedtime.     gabapentin (NEURONTIN) 100 MG capsule Take 100 mg by mouth 2 (two) times daily.     glipiZIDE (GLUCOTROL) 10 MG tablet Take 1 tablet (10 mg total) by mouth 2 (two) times daily before a meal. 180 tablet 3   isosorbide mononitrate (ISMO) 10 MG tablet Take 10 mg by mouth 2 (two) times daily.     metFORMIN (GLUCOPHAGE) 1000 MG tablet Take 1,000 mg by mouth 2 (two) times daily.     metFORMIN (GLUCOPHAGE-XR) 500 MG 24 hr tablet TAKE 2 TABLETS BY MOUTH  DAILY WITH BREAKFAST (Patient taking differently: Take 750 mg by mouth in the morning and at bedtime.) 180 tablet 3   metoprolol succinate (TOPROL-XL) 25 MG 24 hr tablet Take 12.5 mg by mouth daily.     Semaglutide 7 MG TABS Take 7 mg by mouth daily.     spironolactone (ALDACTONE) 25 MG tablet Take 25 mg by mouth as needed.     Tiotropium Bromide-Olodaterol (STIOLTO RESPIMAT) 2.5-2.5 MCG/ACT AERS Inhale 2 puffs into the lungs daily. 4 g 0   traZODone (DESYREL) 150 MG tablet Take 150 mg by mouth at bedtime.     sertraline (ZOLOFT) 50 MG tablet Take 1/2 tab (39m) by mouth at bedtime for 6 days then increase to 1 tab (558m by mouth every night at bedtime. (Patient not taking: Reported on 12/13/2021) 30 tablet 3   traMADol (ULTRAM) 50 MG tablet TAKE 1 TABLET BY MOUTH EVERY 6 HOURS AS NEEDED FOR SEVERE PAIN (Patient not taking: Reported on  12/13/2021) 60 tablet 1   No current facility-administered medications for this visit.    Allergies  Allergen Reactions   Flomax [Tamsulosin Hcl] Other (See Comments)    This medication makes patient feel sick   Penicillins Other (See Comments)    Reaction unknown occurred during childhood Has patient had a PCN reaction causing immediate rash, facial/tongue/throat swelling, SOB or lightheadedness with hypotension: unsure - childhood reaction Has patient had a PCN reaction causing severe rash involving mucus membranes or skin necrosis: unsure - childhood reaction Has patient had a PCN reaction that required hospitalization no Has patient had a PCN reaction occurring within the last 10 years: no  If all of the above answers are "NO", then may p    Social History   Socioeconomic History   Marital status: Divorced    Spouse name: Not on file   Number of children: Not on file   Years of education: Not on file   Highest education level: Not on file  Occupational History   Not on file  Tobacco Use   Smoking status: Former    Packs/day: 1.50    Years: 45.00    Total pack years: 67.50    Types: Cigarettes   Smokeless tobacco: Never   Tobacco comments:    Pt states quitting 7 months ago 07/04/24 ARJ   Vaping Use   Vaping Use: Never used  Substance and Sexual Activity   Alcohol use: No    Comment: past hx of ETOH use was in inpt facility per court order  - 20 years, 10-18-2015 per pt not anymore,per pt stopped about 15-20 yrs ago   Drug use: No    Types: Marijuana, Heroin, Cocaine    Comment: snorted heroin and cocaine, 10-18-2015 per pt in the past he did Marijuana only and nothing else   Sexual activity: Not Currently    Partners: Female  Other Topics Concern   Not on file  Social History Narrative   Lives in [place]. He lives alone. He has kids but is not married.    Social Determinants of Health   Financial Resource Strain: Not on file  Food Insecurity: Not on file   Transportation Needs: Not on file  Physical Activity: Not on file  Stress: Not on file  Social Connections: Not on file  Intimate Partner Violence: Not on file    Family History  Problem Relation Age of Onset   Coronary artery disease Father    Depression Father    Heart attack Father    Depression Mother     Review of Systems:  As stated in the HPI and otherwise negative.   BP 110/70   Pulse 71   Ht _0  (1.727 m)   Wt 203 lb (92.1 kg)   BMI 30.87 kg/m   Physical Examination: General: Well developed, well nourished, NAD  HEENT: OP clear, mucus membranes moist  SKIN: warm, dry. No rashes. Neuro: No focal deficits  Musculoskeletal: Muscle strength 5/5 all ext  Psychiatric: Mood and affect normal  Neck: No JVD, no carotid bruits, no thyromegaly, no lymphadenopathy.  Lungs:Clear bilaterally, no wheezes, rhonci, crackles Cardiovascular: Regular rate and rhythm. No murmurs, gallops or rubs. Abdomen:Soft. Bowel sounds present. Non-tender.  Extremities: No right lower extremity edema. Left AKA.   EKG:  EKG is ordered today. The ekg ordered today demonstrates NSR, inferior Q waves  Recent Labs: 02/17/2021: ALT 24 02/22/2021: TSH 1.708 02/26/2021: B Natriuretic Peptide 45.1 03/01/2021: Magnesium 2.1 06/20/2021: BUN 13; Creatinine, Ser 0.82; Hemoglobin 17.4; Platelets 210; Potassium 4.5; Sodium 139   Lipid Panel    Component Value Date/Time   CHOL 105 02/22/2021 0329   CHOL 239 (H) 02/15/2017 1617   TRIG 89 02/22/2021 0329   HDL 38 (L) 02/22/2021 0329   HDL 47 02/15/2017 1617   CHOLHDL 2.8 02/22/2021 0329   VLDL 18 02/22/2021 0329   LDLCALC 49 02/22/2021 0329   LDLCALC 80 09/23/2018 1429   LDLDIRECT 106 (H) 10/30/2019 1040     Wt Readings from Last 3 Encounters:  12/13/21 203 lb (92.1 kg)  06/20/21 175 lb (79.4 kg)  03/28/21 175 lb (79.4 kg)  Assessment and Plan:   1. CAD without angina: No chest pain. Continue beta blocker and statin. He is off of the ASA  since he is on Eliquis.   2. Atrial fibrillation, paroxysmal: Sinus today. Continue Eliquis and Toprol. Continue beta blocker.   3. HLD: Will continue statin.   4. Tobacco abuse: He has stopped smoking  5. PAD: stable. Followed by Vascular surgery  6. Chronic diastolic CHF: Volume status is ok. Lasix as needed.   Labs/ tests ordered today include:   No orders of the defined types were placed in this encounter.  Disposition:   F/U with me in 12 months  Signed, Lauree Chandler, MD 12/13/2021 4:24 PM    [facility] [address], [place], [place]  [address] Phone: [phone]; Fax: [phone]

## 2021-12-15 NOTE — Addendum Note (Signed)
Addended by: Janan Halter F on: 12/15/2021 07:24 AM   Modules accepted: Orders

## 2023-07-26 ENCOUNTER — Encounter (HOSPITAL_COMMUNITY): Payer: Self-pay | Admitting: *Deleted

## 2023-07-26 ENCOUNTER — Emergency Department (HOSPITAL_COMMUNITY)

## 2023-07-26 ENCOUNTER — Other Ambulatory Visit: Payer: Self-pay

## 2023-07-26 ENCOUNTER — Inpatient Hospital Stay (HOSPITAL_COMMUNITY)

## 2023-07-26 ENCOUNTER — Inpatient Hospital Stay (HOSPITAL_COMMUNITY)
Admission: EM | Admit: 2023-07-26 | Discharge: 2023-08-15 | DRG: 291 | Disposition: A | Discharge status: Skilled Nursing Facility | Source: Skilled Nursing Facility | Attending: Internal Medicine | Admitting: Internal Medicine

## 2023-07-26 DIAGNOSIS — I10 Essential (primary) hypertension: Secondary | ICD-10-CM | POA: Diagnosis present

## 2023-07-26 DIAGNOSIS — J9601 Acute respiratory failure with hypoxia: Principal | ICD-10-CM | POA: Insufficient documentation

## 2023-07-26 DIAGNOSIS — M84411A Pathological fracture, right shoulder, initial encounter for fracture: Secondary | ICD-10-CM | POA: Diagnosis present

## 2023-07-26 DIAGNOSIS — I159 Secondary hypertension, unspecified: Secondary | ICD-10-CM | POA: Diagnosis not present

## 2023-07-26 DIAGNOSIS — Y92239 Unspecified place in hospital as the place of occurrence of the external cause: Secondary | ICD-10-CM | POA: Diagnosis not present

## 2023-07-26 DIAGNOSIS — Z888 Allergy status to other drugs, medicaments and biological substances status: Secondary | ICD-10-CM

## 2023-07-26 DIAGNOSIS — J438 Other emphysema: Secondary | ICD-10-CM | POA: Diagnosis present

## 2023-07-26 DIAGNOSIS — Z66 Do not resuscitate: Secondary | ICD-10-CM | POA: Diagnosis present

## 2023-07-26 DIAGNOSIS — Z515 Encounter for palliative care: Secondary | ICD-10-CM | POA: Diagnosis not present

## 2023-07-26 DIAGNOSIS — I493 Ventricular premature depolarization: Secondary | ICD-10-CM | POA: Diagnosis present

## 2023-07-26 DIAGNOSIS — E1151 Type 2 diabetes mellitus with diabetic peripheral angiopathy without gangrene: Secondary | ICD-10-CM | POA: Diagnosis present

## 2023-07-26 DIAGNOSIS — J189 Pneumonia, unspecified organism: Secondary | ICD-10-CM

## 2023-07-26 DIAGNOSIS — Z7951 Long term (current) use of inhaled steroids: Secondary | ICD-10-CM

## 2023-07-26 DIAGNOSIS — J440 Chronic obstructive pulmonary disease with acute lower respiratory infection: Secondary | ICD-10-CM | POA: Diagnosis present

## 2023-07-26 DIAGNOSIS — Z86718 Personal history of other venous thrombosis and embolism: Secondary | ICD-10-CM | POA: Diagnosis not present

## 2023-07-26 DIAGNOSIS — J441 Chronic obstructive pulmonary disease with (acute) exacerbation: Secondary | ICD-10-CM | POA: Diagnosis present

## 2023-07-26 DIAGNOSIS — T17500A Unspecified foreign body in bronchus causing asphyxiation, initial encounter: Secondary | ICD-10-CM | POA: Diagnosis not present

## 2023-07-26 DIAGNOSIS — I5A Non-ischemic myocardial injury (non-traumatic): Secondary | ICD-10-CM | POA: Diagnosis present

## 2023-07-26 DIAGNOSIS — E1165 Type 2 diabetes mellitus with hyperglycemia: Secondary | ICD-10-CM | POA: Diagnosis not present

## 2023-07-26 DIAGNOSIS — Z818 Family history of other mental and behavioral disorders: Secondary | ICD-10-CM

## 2023-07-26 DIAGNOSIS — M8448XA Pathological fracture, other site, initial encounter for fracture: Secondary | ICD-10-CM | POA: Diagnosis present

## 2023-07-26 DIAGNOSIS — Z7189 Other specified counseling: Secondary | ICD-10-CM | POA: Diagnosis not present

## 2023-07-26 DIAGNOSIS — I480 Paroxysmal atrial fibrillation: Secondary | ICD-10-CM

## 2023-07-26 DIAGNOSIS — Z1152 Encounter for screening for COVID-19: Secondary | ICD-10-CM

## 2023-07-26 DIAGNOSIS — J8410 Pulmonary fibrosis, unspecified: Secondary | ICD-10-CM | POA: Diagnosis not present

## 2023-07-26 DIAGNOSIS — Z79899 Other long term (current) drug therapy: Secondary | ICD-10-CM

## 2023-07-26 DIAGNOSIS — I110 Hypertensive heart disease with heart failure: Secondary | ICD-10-CM | POA: Diagnosis present

## 2023-07-26 DIAGNOSIS — I509 Heart failure, unspecified: Secondary | ICD-10-CM

## 2023-07-26 DIAGNOSIS — Z96641 Presence of right artificial hip joint: Secondary | ICD-10-CM | POA: Diagnosis present

## 2023-07-26 DIAGNOSIS — I483 Typical atrial flutter: Secondary | ICD-10-CM | POA: Diagnosis not present

## 2023-07-26 DIAGNOSIS — I959 Hypotension, unspecified: Secondary | ICD-10-CM | POA: Diagnosis present

## 2023-07-26 DIAGNOSIS — I4819 Other persistent atrial fibrillation: Secondary | ICD-10-CM | POA: Diagnosis present

## 2023-07-26 DIAGNOSIS — D649 Anemia, unspecified: Secondary | ICD-10-CM | POA: Diagnosis present

## 2023-07-26 DIAGNOSIS — D509 Iron deficiency anemia, unspecified: Secondary | ICD-10-CM | POA: Diagnosis not present

## 2023-07-26 DIAGNOSIS — E785 Hyperlipidemia, unspecified: Secondary | ICD-10-CM | POA: Diagnosis present

## 2023-07-26 DIAGNOSIS — D513 Other dietary vitamin B12 deficiency anemia: Secondary | ICD-10-CM | POA: Diagnosis present

## 2023-07-26 DIAGNOSIS — Z8616 Personal history of COVID-19: Secondary | ICD-10-CM

## 2023-07-26 DIAGNOSIS — G9341 Metabolic encephalopathy: Secondary | ICD-10-CM | POA: Diagnosis present

## 2023-07-26 DIAGNOSIS — Z7901 Long term (current) use of anticoagulants: Secondary | ICD-10-CM

## 2023-07-26 DIAGNOSIS — R7989 Other specified abnormal findings of blood chemistry: Secondary | ICD-10-CM | POA: Diagnosis not present

## 2023-07-26 DIAGNOSIS — I5031 Acute diastolic (congestive) heart failure: Secondary | ICD-10-CM | POA: Diagnosis not present

## 2023-07-26 DIAGNOSIS — B029 Zoster without complications: Secondary | ICD-10-CM | POA: Diagnosis present

## 2023-07-26 DIAGNOSIS — Z89612 Acquired absence of left leg above knee: Secondary | ICD-10-CM

## 2023-07-26 DIAGNOSIS — J159 Unspecified bacterial pneumonia: Secondary | ICD-10-CM | POA: Diagnosis present

## 2023-07-26 DIAGNOSIS — J449 Chronic obstructive pulmonary disease, unspecified: Secondary | ICD-10-CM | POA: Diagnosis not present

## 2023-07-26 DIAGNOSIS — I5033 Acute on chronic diastolic (congestive) heart failure: Secondary | ICD-10-CM | POA: Diagnosis present

## 2023-07-26 DIAGNOSIS — I2510 Atherosclerotic heart disease of native coronary artery without angina pectoris: Secondary | ICD-10-CM | POA: Diagnosis not present

## 2023-07-26 DIAGNOSIS — Z8249 Family history of ischemic heart disease and other diseases of the circulatory system: Secondary | ICD-10-CM

## 2023-07-26 DIAGNOSIS — J410 Simple chronic bronchitis: Secondary | ICD-10-CM | POA: Diagnosis not present

## 2023-07-26 DIAGNOSIS — E1169 Type 2 diabetes mellitus with other specified complication: Secondary | ICD-10-CM | POA: Diagnosis present

## 2023-07-26 DIAGNOSIS — E871 Hypo-osmolality and hyponatremia: Secondary | ICD-10-CM | POA: Diagnosis present

## 2023-07-26 DIAGNOSIS — Z87891 Personal history of nicotine dependence: Secondary | ICD-10-CM

## 2023-07-26 DIAGNOSIS — J9621 Acute and chronic respiratory failure with hypoxia: Secondary | ICD-10-CM | POA: Insufficient documentation

## 2023-07-26 DIAGNOSIS — R001 Bradycardia, unspecified: Secondary | ICD-10-CM | POA: Diagnosis not present

## 2023-07-26 DIAGNOSIS — N179 Acute kidney failure, unspecified: Secondary | ICD-10-CM | POA: Diagnosis present

## 2023-07-26 DIAGNOSIS — J849 Interstitial pulmonary disease, unspecified: Secondary | ICD-10-CM | POA: Diagnosis not present

## 2023-07-26 DIAGNOSIS — F0393 Unspecified dementia, unspecified severity, with mood disturbance: Secondary | ICD-10-CM | POA: Diagnosis present

## 2023-07-26 DIAGNOSIS — Z880 Allergy status to penicillin: Secondary | ICD-10-CM

## 2023-07-26 DIAGNOSIS — I4891 Unspecified atrial fibrillation: Secondary | ICD-10-CM | POA: Diagnosis not present

## 2023-07-26 DIAGNOSIS — J439 Emphysema, unspecified: Secondary | ICD-10-CM | POA: Diagnosis not present

## 2023-07-26 DIAGNOSIS — W06XXXA Fall from bed, initial encounter: Secondary | ICD-10-CM | POA: Diagnosis present

## 2023-07-26 DIAGNOSIS — I252 Old myocardial infarction: Secondary | ICD-10-CM

## 2023-07-26 DIAGNOSIS — Z9981 Dependence on supplemental oxygen: Secondary | ICD-10-CM

## 2023-07-26 DIAGNOSIS — T380X5A Adverse effect of glucocorticoids and synthetic analogues, initial encounter: Secondary | ICD-10-CM | POA: Diagnosis present

## 2023-07-26 DIAGNOSIS — Z7984 Long term (current) use of oral hypoglycemic drugs: Secondary | ICD-10-CM

## 2023-07-26 LAB — RESPIRATORY PANEL BY PCR

## 2023-07-26 LAB — ECHOCARDIOGRAM COMPLETE
Area-P 1/2: 3.85 cm2
S' Lateral: 3.3 cm
Single Plane A2C EF: 55.7 %

## 2023-07-26 LAB — CBG MONITORING, ED
Glucose-Capillary: 162 mg/dL — ABNORMAL HIGH (ref 70–99)
Glucose-Capillary: 124 mg/dL — ABNORMAL HIGH (ref 70–99)

## 2023-07-26 LAB — URINALYSIS, ROUTINE W REFLEX MICROSCOPIC
Bacteria, UA: NONE SEEN
Bilirubin Urine: NEGATIVE
Glucose, UA: 50 mg/dL — AB
Ketones, ur: NEGATIVE mg/dL
Leukocytes,Ua: NEGATIVE
Nitrite: NEGATIVE
Protein, ur: 30 mg/dL — AB
Specific Gravity, Urine: 1.014 (ref 1.005–1.030)
pH: 5 (ref 5.0–8.0)

## 2023-07-26 LAB — CBC WITH DIFFERENTIAL/PLATELET
Abs Immature Granulocytes: 0.24 K/uL — ABNORMAL HIGH (ref 0.00–0.07)
Basophils Absolute: 0.1 K/uL (ref 0.0–0.1)
Basophils Relative: 0 %
Eosinophils Absolute: 0 K/uL (ref 0.0–0.5)
Eosinophils Relative: 0 %
HCT: 34.5 % — ABNORMAL LOW (ref 39.0–52.0)
Hemoglobin: 9.3 g/dL — ABNORMAL LOW (ref 13.0–17.0)
Immature Granulocytes: 1 %
Lymphocytes Relative: 4 %
Lymphs Abs: 1 K/uL (ref 0.7–4.0)
MCH: 19.7 pg — ABNORMAL LOW (ref 26.0–34.0)
MCHC: 27 g/dL — ABNORMAL LOW (ref 30.0–36.0)
MCV: 72.9 fL — ABNORMAL LOW (ref 80.0–100.0)
Monocytes Absolute: 1.5 K/uL — ABNORMAL HIGH (ref 0.1–1.0)
Monocytes Relative: 7 %
Neutro Abs: 19 K/uL — ABNORMAL HIGH (ref 1.7–7.7)
Neutrophils Relative %: 88 %
Platelets: 299 K/uL (ref 150–400)
RBC: 4.73 MIL/uL (ref 4.22–5.81)
RDW: 19.9 % — ABNORMAL HIGH (ref 11.5–15.5)
Smear Review: NORMAL
WBC: 21.8 K/uL — ABNORMAL HIGH (ref 4.0–10.5)
nRBC: 0 % (ref 0.0–0.2)

## 2023-07-26 LAB — COMPREHENSIVE METABOLIC PANEL WITH GFR
ALT: 14 U/L (ref 0–44)
AST: 23 U/L (ref 15–41)
Albumin: 3.3 g/dL — ABNORMAL LOW (ref 3.5–5.0)
Alkaline Phosphatase: 57 U/L (ref 38–126)
Anion gap: 11 (ref 5–15)
BUN: 24 mg/dL — ABNORMAL HIGH (ref 8–23)
CO2: 21 mmol/L — ABNORMAL LOW (ref 22–32)
Calcium: 9 mg/dL (ref 8.9–10.3)
Chloride: 102 mmol/L (ref 98–111)
Creatinine, Ser: 2.21 mg/dL — ABNORMAL HIGH (ref 0.61–1.24)
GFR, Estimated: 30 mL/min — ABNORMAL LOW (ref >60)
Glucose, Bld: 175 mg/dL — ABNORMAL HIGH (ref 70–99)
Potassium: 4.8 mmol/L (ref 3.5–5.1)
Sodium: 134 mmol/L — ABNORMAL LOW (ref 135–145)
Total Bilirubin: 0.9 mg/dL (ref 0.0–1.2)
Total Protein: 7.2 g/dL (ref 6.5–8.1)

## 2023-07-26 LAB — I-STAT ARTERIAL BLOOD GAS, ED
Acid-base deficit: 2 mmol/L (ref 0.0–2.0)
Bicarbonate: 22.9 mmol/L (ref 20.0–28.0)
Calcium, Ion: 1.26 mmol/L (ref 1.15–1.40)
HCT: 33 % — ABNORMAL LOW (ref 39.0–52.0)
Hemoglobin: 11.2 g/dL — ABNORMAL LOW (ref 13.0–17.0)
O2 Saturation: 89 %
Patient temperature: 97.5
Potassium: 4.8 mmol/L (ref 3.5–5.1)
Sodium: 136 mmol/L (ref 135–145)
TCO2: 24 mmol/L (ref 22–32)
pCO2 arterial: 39.6 mmHg (ref 32–48)
pH, Arterial: 7.368 (ref 7.35–7.45)
pO2, Arterial: 57 mmHg — ABNORMAL LOW (ref 83–108)

## 2023-07-26 LAB — TYPE AND SCREEN
ABO/RH(D): B POS
Antibody Screen: NEGATIVE

## 2023-07-26 LAB — BRAIN NATRIURETIC PEPTIDE: B Natriuretic Peptide: 453.5 pg/mL — ABNORMAL HIGH (ref 0.0–100.0)

## 2023-07-26 LAB — GLUCOSE, CAPILLARY
Glucose-Capillary: 168 mg/dL — ABNORMAL HIGH (ref 70–99)
Glucose-Capillary: 201 mg/dL — ABNORMAL HIGH (ref 70–99)

## 2023-07-26 LAB — RESP PANEL BY RT-PCR (RSV, FLU A&B, COVID)  RVPGX2
Influenza A by PCR: NEGATIVE
Influenza B by PCR: NEGATIVE
Resp Syncytial Virus by PCR: NEGATIVE
SARS Coronavirus 2 by RT PCR: NEGATIVE

## 2023-07-26 LAB — TROPONIN I (HIGH SENSITIVITY)
Troponin I (High Sensitivity): 58 ng/L — ABNORMAL HIGH (ref <18)
Troponin I (High Sensitivity): 59 ng/L — ABNORMAL HIGH (ref <18)

## 2023-07-26 MED ORDER — ALBUTEROL SULFATE (2.5 MG/3ML) 0.083% IN NEBU: 2.5000 mg | INHALATION_SOLUTION | RESPIRATORY_TRACT | Status: DC | PRN

## 2023-07-26 MED ORDER — BUDESONIDE 0.25 MG/2ML IN SUSP
0.2500 mg | Freq: Two times a day (BID) | RESPIRATORY_TRACT | Status: DC
  Administered 2023-07-26 – 2023-07-29 (×7): 0.25 mg via RESPIRATORY_TRACT
  Filled 2023-07-26 (×7): qty 2

## 2023-07-26 MED ORDER — IPRATROPIUM-ALBUTEROL 0.5-2.5 (3) MG/3ML IN SOLN
3.0000 mL | Freq: Four times a day (QID) | RESPIRATORY_TRACT | Status: DC
  Administered 2023-07-26 (×3): 3 mL via RESPIRATORY_TRACT
  Filled 2023-07-26 (×3): qty 3

## 2023-07-26 MED ORDER — FUROSEMIDE 10 MG/ML IJ SOLN: 40.0000 mg | Freq: Every day | INTRAMUSCULAR | Status: DC

## 2023-07-26 MED ORDER — SODIUM CHLORIDE 0.9 % IV SOLN
500.0000 mg | INTRAVENOUS | Status: AC
  Administered 2023-07-26 – 2023-07-28 (×3): 500 mg via INTRAVENOUS
  Filled 2023-07-26 (×3): qty 5

## 2023-07-26 MED ORDER — UMECLIDINIUM BROMIDE 62.5 MCG/ACT IN AEPB
1.0000 | INHALATION_SPRAY | Freq: Every day | RESPIRATORY_TRACT | Status: DC
  Administered 2023-07-26: 1 via RESPIRATORY_TRACT
  Filled 2023-07-26: qty 7

## 2023-07-26 MED ORDER — FUROSEMIDE 10 MG/ML IJ SOLN
80.0000 mg | Freq: Two times a day (BID) | INTRAMUSCULAR | Status: AC
  Administered 2023-07-26 – 2023-07-27 (×2): 80 mg via INTRAVENOUS
  Filled 2023-07-26 (×2): qty 8

## 2023-07-26 MED ORDER — SODIUM CHLORIDE 3 % IN NEBU
4.0000 mL | INHALATION_SOLUTION | Freq: Two times a day (BID) | RESPIRATORY_TRACT | Status: AC
  Administered 2023-07-26 – 2023-07-30 (×8): 4 mL via RESPIRATORY_TRACT
  Filled 2023-07-26 (×8): qty 4

## 2023-07-26 MED ORDER — ARFORMOTEROL TARTRATE 15 MCG/2ML IN NEBU
15.0000 ug | INHALATION_SOLUTION | Freq: Two times a day (BID) | RESPIRATORY_TRACT | Status: DC
  Administered 2023-07-26: 15 ug via RESPIRATORY_TRACT
  Filled 2023-07-26: qty 2

## 2023-07-26 MED ORDER — SODIUM CHLORIDE 0.9 % IV SOLN
1.0000 g | INTRAVENOUS | Status: AC
  Administered 2023-07-26 – 2023-07-30 (×5): 1 g via INTRAVENOUS
  Filled 2023-07-26 (×5): qty 10

## 2023-07-26 MED ORDER — ATORVASTATIN CALCIUM 40 MG PO TABS
40.0000 mg | ORAL_TABLET | Freq: Every day | ORAL | Status: DC
  Administered 2023-07-26 – 2023-08-15 (×21): 40 mg via ORAL
  Filled 2023-07-26 (×22): qty 1

## 2023-07-26 MED ORDER — ACETAMINOPHEN 500 MG PO TABS
1000.0000 mg | ORAL_TABLET | Freq: Four times a day (QID) | ORAL | Status: DC | PRN
  Administered 2023-08-02 – 2023-08-14 (×4): 1000 mg via ORAL
  Filled 2023-07-26 (×4): qty 2

## 2023-07-26 MED ORDER — POLYETHYLENE GLYCOL 3350 17 G PO PACK
17.0000 g | PACK | Freq: Every day | ORAL | Status: DC | PRN
  Administered 2023-08-07 – 2023-08-13 (×3): 17 g via ORAL
  Filled 2023-07-26 (×3): qty 1

## 2023-07-26 MED ORDER — SODIUM CHLORIDE 0.9 % IV BOLUS: 500.0000 mL | Freq: Once | INTRAVENOUS | Status: DC

## 2023-07-26 MED ORDER — AZITHROMYCIN 250 MG PO TABS: 500.0000 mg | ORAL_TABLET | Freq: Every day | ORAL | Status: DC

## 2023-07-26 MED ORDER — FUROSEMIDE 10 MG/ML IJ SOLN
40.0000 mg | Freq: Once | INTRAMUSCULAR | Status: AC
  Administered 2023-07-26: 40 mg via INTRAVENOUS
  Filled 2023-07-26: qty 4

## 2023-07-26 MED ORDER — ESCITALOPRAM OXALATE 10 MG PO TABS
10.0000 mg | ORAL_TABLET | Freq: Every day | ORAL | Status: DC
  Administered 2023-07-27 – 2023-08-14 (×19): 10 mg via ORAL
  Filled 2023-07-26 (×19): qty 1

## 2023-07-26 MED ORDER — METOPROLOL TARTRATE 5 MG/5ML IV SOLN
5.0000 mg | INTRAVENOUS | Status: DC | PRN
  Administered 2023-07-27 – 2023-08-02 (×6): 5 mg via INTRAVENOUS
  Filled 2023-07-26 (×7): qty 5

## 2023-07-26 MED ORDER — INSULIN ASPART 100 UNIT/ML IJ SOLN
0.0000 [IU] | Freq: Three times a day (TID) | INTRAMUSCULAR | Status: DC
  Administered 2023-07-26 (×2): 1 [IU] via SUBCUTANEOUS
  Administered 2023-07-27: 2 [IU] via SUBCUTANEOUS
  Administered 2023-07-27: 1 [IU] via SUBCUTANEOUS
  Administered 2023-07-27: 3 [IU] via SUBCUTANEOUS
  Administered 2023-07-28 (×2): 2 [IU] via SUBCUTANEOUS

## 2023-07-26 MED ORDER — ONDANSETRON HCL 4 MG/2ML IJ SOLN: 4.0000 mg | Freq: Four times a day (QID) | INTRAMUSCULAR | Status: DC | PRN

## 2023-07-26 MED ORDER — MELATONIN 3 MG PO TABS
6.0000 mg | ORAL_TABLET | Freq: Every evening | ORAL | Status: DC | PRN
  Administered 2023-07-27 – 2023-08-14 (×19): 6 mg via ORAL
  Filled 2023-07-26 (×20): qty 2

## 2023-07-26 MED ORDER — IPRATROPIUM-ALBUTEROL 0.5-2.5 (3) MG/3ML IN SOLN
3.0000 mL | Freq: Four times a day (QID) | RESPIRATORY_TRACT | Status: DC
  Administered 2023-07-26 – 2023-07-27 (×3): 3 mL via RESPIRATORY_TRACT
  Filled 2023-07-26 (×3): qty 3

## 2023-07-26 MED ORDER — GABAPENTIN 100 MG PO CAPS
100.0000 mg | ORAL_CAPSULE | Freq: Two times a day (BID) | ORAL | Status: DC
  Administered 2023-07-27 – 2023-07-28 (×3): 100 mg via ORAL
  Filled 2023-07-26 (×3): qty 1

## 2023-07-26 MED ORDER — METHYLPREDNISOLONE SODIUM SUCC 40 MG IJ SOLR
80.0000 mg | Freq: Two times a day (BID) | INTRAMUSCULAR | Status: DC
  Administered 2023-07-26 – 2023-07-29 (×6): 80 mg via INTRAVENOUS
  Filled 2023-07-26 (×7): qty 2

## 2023-07-26 MED ORDER — SODIUM CHLORIDE 0.9% FLUSH
3.0000 mL | Freq: Two times a day (BID) | INTRAVENOUS | Status: DC
  Administered 2023-07-26 – 2023-07-30 (×9): 3 mL via INTRAVENOUS

## 2023-07-26 MED ORDER — METHYLPREDNISOLONE SODIUM SUCC 40 MG IJ SOLR
40.0000 mg | Freq: Two times a day (BID) | INTRAMUSCULAR | Status: DC
  Administered 2023-07-26: 40 mg via INTRAVENOUS
  Filled 2023-07-26: qty 1

## 2023-07-26 MED ORDER — APIXABAN 5 MG PO TABS
5.0000 mg | ORAL_TABLET | Freq: Two times a day (BID) | ORAL | Status: DC
  Administered 2023-07-26 – 2023-08-15 (×40): 5 mg via ORAL
  Filled 2023-07-26 (×41): qty 1

## 2023-07-26 NOTE — TOC Initial Note (Signed)
 Transition of Care (TOC) - Initial/Assessment Note    Patient Details  Name: Anthony [name]. MRN: [id number] Date of Birth: May 23, 1950  Transition of Care [facility]) CM/SW Contact:    Lauraine FORBES Saa, LCSW Phone Number: 07/26/2023, 4:03 PM  Clinical Narrative:                  4:04 PM Per chart review, patient is from [facility]. [facility] admissions confirmed patient is LTC at [facility] and able to return upon discharge.   Expected Discharge Plan: Long Term Nursing Home Barriers to Discharge: Continued Medical Work up   Patient Goals and CMS Choice            Expected Discharge Plan and Services In-house Referral: Clinical Social Work   Post Acute Care Choice: Skilled Nursing Facility Living arrangements for the past 2 months: Skilled Nursing Facility                                      Prior Living Arrangements/Services Living arrangements for the past 2 months: Skilled Nursing Facility Lives with:: Facility Resident Patient language and need for interpreter reviewed:: Yes        Need for Family Participation in Patient Care: Yes (Comment) Care giver support system in place?: Yes (comment)   Criminal Activity/Legal Involvement Pertinent to Current Situation/Hospitalization: No - Comment as needed  Activities of Daily Living      Permission Sought/Granted Permission sought to share information with : Family Supports, Oceanographer granted to share information with : No (Contact information on chart)  Share Information with NAME: Cena Potters  Permission granted to share info w AGENCY: [facility] LTC  Permission granted to share info w Relationship: Daughter  Permission granted to share info w Contact Information: [phone]  Emotional Assessment Appearance:: Appears stated age Attitude/Demeanor/Rapport: Unable to Assess Affect (typically observed): Unable to Assess Orientation: : Oriented to  Self Alcohol  / Substance Use: Not Applicable Psych Involvement: No (comment)  Admission diagnosis:  Hyponatremia [E87.1] Microcytic anemia [D50.9] Chronic anticoagulation [Z79.01] Elevated troponin [R79.89] Acute kidney injury (nontraumatic) (HCC) [N17.9] Acute exacerbation of CHF (congestive heart failure) (HCC) [I50.9] Acute respiratory failure with hypoxemia (HCC) [J96.01] Acute on chronic congestive heart failure, unspecified heart failure type (HCC) [I50.9] Patient Active Problem List   Diagnosis Date Noted   Acute exacerbation of CHF (congestive heart failure) (HCC) 07/26/2023   Acute respiratory failure with hypoxemia (HCC) 07/26/2023   ILD (interstitial lung disease) (HCC) 03/28/2021   Paroxysmal atrial fibrillation with RVR (HCC) 02/21/2021   Pressure injury of skin 02/13/2021   Rib fractures 02/13/2021   History of DVT (deep vein thrombosis) 02/13/2021   HTN (hypertension) 02/13/2021   Chronic respiratory failure with hypoxia (HCC) 02/12/2021   Hypokalemia 02/12/2021   Bipolar 1 disorder (HCC) 02/12/2021   Right flank pain 11/01/2019   Fall against object 09/25/2019   Cerebellar ataxia in diseases classified elsewhere (HCC) 08/26/2019   Chronic pain of right knee 07/14/2019   Chronic constipation 03/24/2019   Spinal stenosis of lumbar region with neurogenic claudication 11/14/2018   Vitamin D  deficiency 09/05/2018   Elevated vitamin B12 level 09/05/2018   Acute neutrophilia 09/05/2018   Chronic fatigue 08/29/2018   Generalized weakness 08/29/2018   Multiple lung nodules 08/03/2018   Insomnia 03/26/2017   Aortic atherosclerosis (HCC) 10/19/2016   Pain medication agreement signed 06/05/2016  DM2 (diabetes mellitus, type 2) (HCC) 05/25/2016   Obesity (BMI 30-39.9) 04/13/2016   Rectal bleeding 11/23/2015   Moderate episode of recurrent major depressive disorder (HCC) 10/18/2015   S/P right THA, AA 05/10/2015   Recurrent left inguinal hernia 07/26/2014   Chronic  right hip pain 06/23/2014   S/P bilateral inguinal hernia repair 10/30/2013   Bilateral inguinal hernia 05/28/2013   Umbilical hernia 05/28/2013   Mood disorder (HCC)    CAD (coronary artery disease)    Hyperlipidemia associated with type 2 diabetes mellitus (HCC)    COPD (chronic obstructive pulmonary disease) (HCC)    Tobacco abuse    PCP:  No primary care provider on file. Pharmacy:   [facility] [place], [place] - [address] [place] [address] [place] [address] [place] [address] Phone: [phone] Fax: [phone]     Social Drivers of Health (SDOH) Social History: SDOH Screenings   Food Insecurity: No Food Insecurity (11/09/2020)   Received from [facility]  Transportation Needs: No Transportation Needs (09/20/2020)   Received from [facility]  Depression [phone]): Medium Risk (11/27/2019)  Financial Resource Strain: Low Risk  (09/15/2020)   Received from [facility]  Social Connections: Unknown (05/23/2021)   Received from [facility]  Stress: No Stress Concern Present (09/15/2020)   Received from [facility]  Tobacco Use: Medium Risk (07/26/2023)   SDOH Interventions:     Readmission Risk Interventions    02/13/2021    3:47 PM  Readmission Risk Prevention Plan  Transportation Screening Complete  PCP or Specialist Appt within 5-7 Days Complete  Home Care Screening Complete  Medication Review (RN CM) Complete

## 2023-07-26 NOTE — ED Provider Notes (Signed)
 Anthony [name] EMERGENCY DEPARTMENT AT [facility] Provider Note   CSN: [id number] Arrival date & time: 07/26/23  0140     Patient presents with: Respiratory Distress   Anthony [name]. is a 73 y.o. male.   The history is provided by the EMS personnel. The history is limited by the condition of the patient (Altered mental status).  He has history of hypertension, diabetes, hyperlipidemia, coronary artery disease, atrial fibrillation anticoagulated on apixaban  and came in by ambulance from a skilled nursing facility where EMS was called for reported fall.  They noted severe hypoxia with oxygen saturation in the 70s but improved with placement on a nonrebreather mask.  Patient has been intermittently responsive but not able to voice any complaints.   Prior to Admission medications   Medication Sig Start Date End Date Taking? Authorizing Provider  albuterol  (VENTOLIN  HFA) 108 (90 Base) MCG/ACT inhaler Inhale 2 puffs into the lungs in the morning, at noon, and at bedtime.    [provider]  apixaban  (ELIQUIS ) 5 MG TABS tablet Take 1 tablet (5 mg total) by mouth 2 (two) times daily. 03/03/21   Anthony Celinda Balo, MD  atorvastatin  (LIPITOR) 40 MG tablet Take 1 tablet (40 mg total) by mouth daily. 12/22/18   Alexander, Natalie, DO  Baclofen  5 MG TABS Take 5 mg by mouth in the morning and at bedtime.    [provider]  blood glucose meter kit and supplies KIT Dispense based on patient and insurance preference. Use to check BG once daily or every other other.  Dx: E11.9 07/17/16   Lavell Bari LABOR, FNP  Dextromethorphan -guaiFENesin  (ROBITUSSIN COUGH+CHEST CONG DM) 10-200 MG CAPS Take 1 capsule by mouth in the morning, at noon, and at bedtime.    [provider]  escitalopram  (LEXAPRO ) 10 MG tablet Take 1 tablet (10 mg total) by mouth at bedtime. 03/03/21   Anthony Celinda Balo, MD  gabapentin  (NEURONTIN ) 100 MG capsule Take 100 mg by mouth 2 (two) times daily.     [provider]  glipiZIDE  (GLUCOTROL ) 10 MG tablet Take 1 tablet (10 mg total) by mouth 2 (two) times daily before a meal. 12/22/18   Anthony Edelman, DO  isosorbide mononitrate (ISMO) 10 MG tablet Take 10 mg by mouth 2 (two) times daily. 06/07/21   [provider]  metFORMIN  (GLUCOPHAGE ) 1000 MG tablet Take 1,000 mg by mouth 2 (two) times daily. 12/04/21   [provider]  metFORMIN  (GLUCOPHAGE -XR) 500 MG 24 hr tablet TAKE 2 TABLETS BY MOUTH  DAILY WITH BREAKFAST Patient taking differently: Take 750 mg by mouth in the morning and at bedtime. 01/18/20   [name], DO  metoprolol  succinate (TOPROL -XL) 25 MG 24 hr tablet Take 12.5 mg by mouth daily.    [provider]  Semaglutide  7 MG TABS Take 7 mg by mouth daily.    [provider]  sertraline  (ZOLOFT ) 50 MG tablet Take 1/2 tab (25mg ) by mouth at bedtime for 6 days then increase to 1 tab (50mg ) by mouth every night at bedtime. Patient not taking: Reported on 12/13/2021 01/22/20   [name], Anthony E, NP  spironolactone (ALDACTONE) 25 MG tablet Take 25 mg by mouth as needed. 09/21/21   [provider]  Tiotropium Bromide-Olodaterol (STIOLTO RESPIMAT ) 2.5-2.5 MCG/ACT AERS Inhale 2 puffs into the lungs daily. 03/28/21   Anthony Lamar RAMAN, MD  traMADol  (ULTRAM ) 50 MG tablet TAKE 1 TABLET BY MOUTH EVERY 6 HOURS AS NEEDED FOR SEVERE PAIN Patient  not taking: Reported on 12/13/2021 02/24/20   [name], DO  traZODone  (DESYREL ) 150 MG tablet Take 150 mg by mouth at bedtime. 12/11/21   [provider]    Allergies: Flomax [tamsulosin hcl] and Penicillins    Review of Systems  Unable to perform ROS: Mental status change    Updated Vital Signs BP 105/65   Pulse 68   Temp (!) 97.5 F (36.4 C) (Oral)   Resp (!) 22   SpO2 94%   Physical Exam Vitals and nursing note reviewed.   73 year old male, resting comfortably and in no acute distress. Vital signs are significant for slightly  elevated respiratory rate. Oxygen saturation is 94%, which is normal, but only able to be maintained while on 100% oxygen via nonrebreather mask. Head is normocephalic and atraumatic. PERRLA, EOMI. Oropharynx is clear. Neck is nontender and supple without adenopathy or JVD. Back is nontender.  There is 1+ presacral edema. Lungs are clear without rales, wheezes, or rhonchi. Chest is nontender. Heart has regular rate and rhythm without murmur. Abdomen is soft, flat, nontender. Extremities have 2+ edema. Skin is warm and dry without rash. Neurologic: Sleepy but arousable, not speaking or following commands.  (all labs ordered are listed, but only abnormal results are displayed) Labs Reviewed  COMPREHENSIVE METABOLIC PANEL WITH GFR  BRAIN NATRIURETIC PEPTIDE  CBC WITH DIFFERENTIAL/PLATELET  I-STAT ARTERIAL BLOOD GAS, ED  TROPONIN I (HIGH SENSITIVITY)    EKG: None  Radiology: No results found.   Procedures  Cardiac monitor shows normal sinus rhythm, per my interpretation. Medications Ordered in the ED - No data to display                                  Medical Decision Making Amount and/or Complexity of Data Reviewed Labs: ordered. Radiology: ordered.  Risk Prescription drug management. Decision regarding hospitalization.   Altered mental status and patient with hypoxia.  This is a presentation with a wide range of treatment options and carries with it a high risk for morbidity and complications.  Differential diagnosis includes, but is not limited to, pneumonia, heart failure exacerbation, occult head injury, hypercarbia.  Pulmonary embolism very unlikely in a patient with chronic anticoagulation.  I have reviewed his past records, and he was admitted on 02/12/2021 with acute respiratory failure with hypoxia secondary to COVID-19 and COPD.  Have ordered laboratory workup including ABG, chest x-ray.  Also, with concern about possible fall and patient anticoagulated, I have  ordered CT of head and cervical spine.  He did arrive with a MOST form stating that he did not wish to be intubated and did not ICU care.  Chest x-ray showed increased interstitial markings consistent with pulmonary edema.  CT of head and cervical spine showed no evidence of acute injury.  I independently viewed all the images, and agree with the radiologist's interpretation.  I have reviewed his laboratory tests, and my interpretation is ABG showing no evidence of CO2 retention, acute kidney injury with creatinine 2.21 compared with 0.82 on 6/13//2023, microcytic anemia which is new compared with 06/20/2021, leukocytosis which is nonspecific, elevated BNP consistent with heart failure, mild elevation of troponin which is felt to represent demand ischemia and not increasing.  I have ordered furosemide .  Unfortunately, blood pressure has trended low.  I have ordered furosemide  hoping that improving the cardiovascular status will improve blood pressure but I am holding off on pressors  because of the MOST form stating no ICU care.  I have discussed case with Dr. Keturah of [facility] who agrees to come to evaluate the patient.  CRITICAL CARE Performed by: Alm Lias Total critical care time: 50 minutes Critical care time was exclusive of separately billable procedures and treating other patients. Critical care was necessary to treat or prevent imminent or life-threatening deterioration. Critical care was time spent personally by me on the following activities: development of treatment plan with patient and/or surrogate as well as nursing, discussions with consultants, evaluation of patient's response to treatment, examination of patient, obtaining history from patient or surrogate, ordering and performing treatments and interventions, ordering and review of laboratory studies, ordering and review of radiographic studies, pulse oximetry and re-evaluation of patient's condition.     Final diagnoses:   Acute respiratory failure with hypoxemia (HCC)  Acute on chronic congestive heart failure, unspecified heart failure type (HCC)  Elevated troponin  Microcytic anemia  Acute kidney injury (nontraumatic) (HCC)  Hyponatremia  Chronic anticoagulation    ED Discharge Orders     None          Lias Alm, MD 07/26/23 [phone]

## 2023-07-26 NOTE — Progress Notes (Signed)
 RT attempted to NTS patient down the right nare but was unable to pass catheter all the way. Patient refused to let RT suction the left nare.

## 2023-07-26 NOTE — Progress Notes (Signed)
 PROGRESS NOTE    Anthony [name].  FMW:[id number] DOB: Dec 24, 1950 DOA: 07/26/2023 PCP: No primary care provider on file.  Outpatient Specialists:     Brief Narrative:  Patient is a 73 year old male, morbidly obese, with past medical history significant for interstitial lung disease, COPD, COVID infection, coronary artery disease, diastolic heart failure, paroxysmal atrial fibrillation, DVT, peripheral artery disease s/p left above-knee amputation, diabetes mellitus type 2 and hyperlipidemia.  Patient is a skilled nursing facility resident.  Patient was admitted with altered mentation, acute on chronic respiratory failure, COPD with exacerbation, and multilobar pneumonia.  07/26/2023: Patient was admitted earlier today.  Patient seems to be more responsive.  Discussed with patient's daughter, Cena Potters, who happens to be patient's power of attorney.  Ms. Potters has confirmed that patient is DO NOT RESUSCITATE.  Will change to CODE STATUS.  Patient remains critically ill.   Assessment & Plan:   Principal Problem:   Acute exacerbation of CHF (congestive heart failure) (HCC)   Acute on chronic hypoxic respiratory failure: COPD with exacerbation/ILD: - Continue arformoterol, umeclidinium. - Start IV Solu-Medrol  40 Mg twice daily -Start nebs Pulmicort  twice daily. - Continue nebs DuoNeb as needed. -Continue supplemental oxygen. - Patient is DNR/DNI.  Possible CHF with exacerbation: - IV Lasix  80 Mg twice daily x 2 doses. - Continue to assess closely. - Volume status is difficult to assess in this patient. - Follow echocardiogram.  Multilobar pneumonia: - Will order blood culture and sputum culture. - Continue antibiotics (azithromycin  and ceftriaxone). - Nasal swab for MRSA. - Low threshold to vancomycin .  Acute encephalopathy, metabolic, cannot rule out toxic: - Likely multifactorial. - See above.  History of PAD: - Status post left AKA.     DVT prophylaxis: Continue  apixaban . Code Status: DO NOT RESUSCITATE/DO NOT INTUBATE Family Communication: Patient's daughter, Cena Mayor. Disposition Plan: Patient remains inpatient.   Consultants:  None.  Procedures:  None.  Antimicrobials:  IV azithromycin . IV Rocephin.   Subjective: -No new complaints. - Since more communicative.  Objective: Vitals:   07/26/23 0815 07/26/23 1010 07/26/23 1030 07/26/23 1100  BP: (!) 96/58 92/60 101/61 108/71  Pulse: 95 91 87 (!) 105  Resp: 20 (!) 24 (!) 25 19  Temp:      TempSrc:      SpO2: 95% 93% 93% 92%    Intake/Output Summary (Last 24 hours) at 07/26/2023 1246 Last data filed at 07/26/2023 0600 Gross per 24 hour  Intake --  Output 460 ml  Net -460 ml   There were no vitals filed for this visit.  Examination:  General exam: Patient is obese.  Sleepy, but easy to arouse.  More communicative.  Respiratory system: Decreased air entry, end expiratory wheeze and expiratory wheeze Cardiovascular system: S1 & S2  Gastrointestinal system: Abdomen is obese, soft and nontender.   Central nervous system: Sleepy but easily arousable.  More communicative.   Extremities: Status post left AKA.  Data Reviewed: I have personally reviewed following labs and imaging studies  CBC: Recent Labs  Lab 07/26/23 0201 07/26/23 0210  WBC  --  21.8*  NEUTROABS  --  19.0*  HGB 11.2* 9.3*  HCT 33.0* 34.5*  MCV  --  72.9*  PLT  --  299   Basic Metabolic Panel: Recent Labs  Lab 07/26/23 0201 07/26/23 0210  NA 136 134*  K 4.8 4.8  CL  --  102  CO2  --  21*  GLUCOSE  --  175*  BUN  --  24*  CREATININE  --  2.21*  CALCIUM   --  9.0   GFR: CrCl cannot be calculated (Unknown ideal weight.). Liver Function Tests: Recent Labs  Lab 07/26/23 0210  AST 23  ALT 14  ALKPHOS 57  BILITOT 0.9  PROT 7.2  ALBUMIN  3.3*   No results for input(s): LIPASE, AMYLASE in the last 168 hours. No results for input(s): AMMONIA in the last 168 hours. Coagulation  Profile: No results for input(s): INR, PROTIME in the last 168 hours. Cardiac Enzymes: No results for input(s): CKTOTAL, CKMB, CKMBINDEX, TROPONINI in the last 168 hours. BNP (last 3 results) No results for input(s): PROBNP in the last 8760 hours. HbA1C: No results for input(s): HGBA1C in the last 72 hours. CBG: Recent Labs  Lab 07/26/23 0746 07/26/23 1218  GLUCAP 162* 124*   Lipid Profile: No results for input(s): CHOL, HDL, LDLCALC, TRIG, CHOLHDL, LDLDIRECT in the last 72 hours. Thyroid  Function Tests: No results for input(s): TSH, T4TOTAL, FREET4, T3FREE, THYROIDAB in the last 72 hours. Anemia Panel: No results for input(s): VITAMINB12, FOLATE, FERRITIN, TIBC, IRON, RETICCTPCT in the last 72 hours. Urine analysis:    Component Value Date/Time   COLORURINE YELLOW 07/26/2023 0621   APPEARANCEUR HAZY (A) 07/26/2023 0621   LABSPEC 1.014 07/26/2023 0621   PHURINE 5.0 07/26/2023 0621   GLUCOSEU 50 (A) 07/26/2023 0621   HGBUR SMALL (A) 07/26/2023 0621   BILIRUBINUR NEGATIVE 07/26/2023 0621   KETONESUR NEGATIVE 07/26/2023 0621   PROTEINUR 30 (A) 07/26/2023 0621   UROBILINOGEN 0.2 05/05/2013 0633   NITRITE NEGATIVE 07/26/2023 0621   LEUKOCYTESUR NEGATIVE 07/26/2023 0621   Sepsis Labs: @LABRCNTIP (procalcitonin:4,lacticidven:4)  ) Recent Results (from the past 240 hours)  Resp panel by RT-PCR (RSV, Flu A&B, Covid) Anterior Nasal Swab     Status: None   Collection Time: 07/26/23  5:32 AM   Specimen: Anterior Nasal Swab  Result Value Ref Range Status   SARS Coronavirus 2 by RT PCR NEGATIVE NEGATIVE Final   Influenza A by PCR NEGATIVE NEGATIVE Final   Influenza B by PCR NEGATIVE NEGATIVE Final    Comment: (NOTE) The Xpert Xpress SARS-CoV-2/FLU/RSV plus assay is intended as an aid in the diagnosis of influenza from Nasopharyngeal swab specimens and should not be used as a sole basis for treatment. Nasal washings and aspirates  are unacceptable for Xpert Xpress SARS-CoV-2/FLU/RSV testing.  Fact Sheet for Patients: BloggerCourse.com  Fact Sheet for Healthcare Providers: SeriousBroker.it  This test is not yet approved or cleared by the [place]  FDA and has been authorized for detection and/or diagnosis of SARS-CoV-2 by FDA under an Emergency Use Authorization (EUA). This EUA will remain in effect (meaning this test can be used) for the duration of the COVID-19 declaration under Section 564(b)(1) of the Act, 21 U.S.C. section 360bbb-3(b)(1), unless the authorization is terminated or revoked.     Resp Syncytial Virus by PCR NEGATIVE NEGATIVE Final    Comment: (NOTE) Fact Sheet for Patients: BloggerCourse.com  Fact Sheet for Healthcare Providers: SeriousBroker.it  This test is not yet approved or cleared by the [place]  FDA and has been authorized for detection and/or diagnosis of SARS-CoV-2 by FDA under an Emergency Use Authorization (EUA). This EUA will remain in effect (meaning this test can be used) for the duration of the COVID-19 declaration under Section 564(b)(1) of the Act, 21 U.S.C. section 360bbb-3(b)(1), unless the authorization is terminated or revoked.  Performed at [facility], [address], [place], [place] [address]  Radiology Studies: CT CHEST WO CONTRAST Result Date: 07/26/2023 CLINICAL DATA:  73 year old male with recent fall. Pain. COPD. Respiratory failure. EXAM: CT CHEST WITHOUT CONTRAST TECHNIQUE: Multidetector CT imaging of the chest was performed following the standard protocol without IV contrast. RADIATION DOSE REDUCTION: This exam was performed according to the departmental dose-optimization program which includes automated exposure control, adjustment of the mA and/or kV according to patient size and/or use of iterative reconstruction technique.  COMPARISON:  Chest CT 02/12/2021. FINDINGS: Cardiovascular: Calcified aortic atherosclerosis. Advanced calcified coronary artery atherosclerosis and/or stent. Vascular patency is not evaluated in the absence of IV contrast. Mild cardiomegaly, not significantly changed. No pericardial effusion. Mediastinum/Nodes: Reactive appearing small but numerous mediastinal lymph nodes. Lungs/Pleura: Moderate to severe emphysema. Respiratory motion. Atelectatic changes to the major airways which are patent through the carina and mainstem bronchi. Bilateral lower lobe consolidation, and bilateral lower lobe and right middle lobe partial airway collapse or opacification. Consolidation also in the lateral segment of the right middle lobe. No pleural effusion. Underlying pulmonary vascularity appears normal. Upper Abdomen: Negative visible noncontrast liver, gallbladder, spleen, pancreas, adrenal glands, right kidney, bowel. No pneumoperitoneum or free fluid in the visible upper abdomen. Musculoskeletal: Stable thoracic vertebral height and alignment since 2023. Sternum and left shoulder osseous structures appear intact. Chronic medial right clavicle fracture is stable since 2023. Chronic left anterior, lateral, posterior left rib fractures. No acute left rib fracture is identified. Chronic right anterior rib fractures. Chronic, partially sclerotic but unhealed right posterolateral 9th rib fracture, was present in 2023. Widely displaced posterolateral right 10th rib fracture on series 4, image 134. That rib was not included previously, but this is favored to be chronic. And there are superimposed chronic fractures of the right 9th, 10th, 11th costovertebral junctions. No acute superficial soft tissue injury identified. IMPRESSION: 1. Advanced Emphysema (ICD10-J43.9) with superimposed Bilateral lower lobe and Right middle lobe Consolidated Pneumonia. No pleural effusion. 2. Numerous chronic bilateral rib fractures. Chronic right  clavicle fracture. No definite acute traumatic injury in the noncontrast Chest. 3.  Aortic Atherosclerosis (ICD10-I70.0). Electronically Signed   By: VEAR Hurst M.D.   On: 07/26/2023 06:06   CT Head Wo Contrast Result Date: 07/26/2023 CLINICAL DATA:  Recent fall with headaches and neck pain, initial encounter EXAM: CT HEAD WITHOUT CONTRAST CT CERVICAL SPINE WITHOUT CONTRAST TECHNIQUE: Multidetector CT imaging of the head and cervical spine was performed following the standard protocol without intravenous contrast. Multiplanar CT image reconstructions of the cervical spine were also generated. RADIATION DOSE REDUCTION: This exam was performed according to the departmental dose-optimization program which includes automated exposure control, adjustment of the mA and/or kV according to patient size and/or use of iterative reconstruction technique. COMPARISON:  None Available. FINDINGS: CT HEAD FINDINGS Brain: No evidence of acute infarction, hemorrhage, hydrocephalus, extra-axial collection or mass lesion/mass effect. Mild atrophic changes and chronic white matter ischemic changes are seen. Vascular: No hyperdense vessel or unexpected calcification. Skull: Normal. Negative for fracture or focal lesion. Sinuses/Orbits: No acute finding. Other: None. CT CERVICAL SPINE FINDINGS Alignment: Within normal limits. Skull base and vertebrae: 7 cervical segments are well visualized. Vertebral body height is well maintained. Mild osteophytic changes are seen. Facet hypertrophic changes are noted as well. No acute fracture or acute facet abnormality is noted. The odontoid is within normal limits. Soft tissues and spinal canal: Findings soft tissue structures are within normal limits. Upper chest: Visualized lung apices show emphysematous changes and biapical scarring. Other: None IMPRESSION: CT of the head: No  acute intracranial abnormality noted. Chronic atrophic and ischemic changes. CT of the cervical spine: Multilevel  degenerative change without acute abnormality. Electronically Signed   By: Oneil Devonshire M.D.   On: 07/26/2023 02:52   CT Cervical Spine Wo Contrast Result Date: 07/26/2023 CLINICAL DATA:  Recent fall with headaches and neck pain, initial encounter EXAM: CT HEAD WITHOUT CONTRAST CT CERVICAL SPINE WITHOUT CONTRAST TECHNIQUE: Multidetector CT imaging of the head and cervical spine was performed following the standard protocol without intravenous contrast. Multiplanar CT image reconstructions of the cervical spine were also generated. RADIATION DOSE REDUCTION: This exam was performed according to the departmental dose-optimization program which includes automated exposure control, adjustment of the mA and/or kV according to patient size and/or use of iterative reconstruction technique. COMPARISON:  None Available. FINDINGS: CT HEAD FINDINGS Brain: No evidence of acute infarction, hemorrhage, hydrocephalus, extra-axial collection or mass lesion/mass effect. Mild atrophic changes and chronic white matter ischemic changes are seen. Vascular: No hyperdense vessel or unexpected calcification. Skull: Normal. Negative for fracture or focal lesion. Sinuses/Orbits: No acute finding. Other: None. CT CERVICAL SPINE FINDINGS Alignment: Within normal limits. Skull base and vertebrae: 7 cervical segments are well visualized. Vertebral body height is well maintained. Mild osteophytic changes are seen. Facet hypertrophic changes are noted as well. No acute fracture or acute facet abnormality is noted. The odontoid is within normal limits. Soft tissues and spinal canal: Findings soft tissue structures are within normal limits. Upper chest: Visualized lung apices show emphysematous changes and biapical scarring. Other: None IMPRESSION: CT of the head: No acute intracranial abnormality noted. Chronic atrophic and ischemic changes. CT of the cervical spine: Multilevel degenerative change without acute abnormality. Electronically Signed    By: Oneil Devonshire M.D.   On: 07/26/2023 02:52   DG Chest Port 1 View Result Date: 07/26/2023 EXAM: 1 VIEW XRAY OF THE CHEST 07/26/2023 01:48:00 AM COMPARISON: 03/07/2021 CLINICAL HISTORY: Shortness of breath. Encounter for shortness of breath. FINDINGS: LUNGS AND PLEURA: Low lung volumes. Increased interstitial markings in the lower lung suspicious for edema. Retrocardiac airspace opacities. Right basilar atelectasis. Small left pleural effusion. No pneumothorax. HEART AND MEDIASTINUM: Accentuated cardiomediastinal silhouette and pulmonary vascularity due to hypoinflation. BONES AND SOFT TISSUES: No acute osseous abnormality. IMPRESSION: 1. Increased interstitial markings in the lower lung suspicious for edema. 2. Small left pleural effusion. 3. Bibasilar atelectasis vs pneumonia. Electronically signed by: Norman Gatlin MD 07/26/2023 01:54 AM EDT RP Workstation: [id number]        Scheduled Meds:  apixaban   5 mg Oral BID   arformoterol  15 mcg Nebulization BID   And   umeclidinium bromide   1 puff Inhalation Daily   atorvastatin   40 mg Oral Daily   escitalopram   10 mg Oral QHS   gabapentin   100 mg Oral BID   insulin  aspart  0-6 Units Subcutaneous TID WC   ipratropium-albuterol   3 mL Nebulization Q6H   sodium chloride  flush  3 mL Intravenous Q12H   Continuous Infusions:  azithromycin  Stopped (07/26/23 0747)   cefTRIAXone (ROCEPHIN)  IV Stopped (07/26/23 9366)     LOS: 0 days    Time spent: 55 minutes.    Leatrice Chapel, MD  [facility] Pager #: [phone] 7PM-7AM contact night coverage as above

## 2023-07-26 NOTE — H&P (Addendum)
 History and Physical    [name]. FMW:[id number] DOB: 01/20/1950 DOA: 07/26/2023  PCP: [facility], DO   Patient coming from: [name]    Chief Complaint:  Chief Complaint  Patient presents with   Respiratory Distress    HPI: Limited due to AMS  [name]. is a 73 y.o. male with hx of ILD, COPD, CAD with hx inferior STEMI, RCA PCI, diastolic HF, paroxysmal A-fib, DVT, on anticoagulation, PAD with L AKA. diabetes type 2, hyperlipidemia, who was brought in from [name] after ground-level fall.  On EMS arrival hypoxic with sats in the 70s and placed on nonrebreather. Altered on arrival to ED   Patient is unable to provide significant history, he awakens and mumbles difficult to understand. No outward complaints.  [facility] called [facility] and per staff there, slid off the bed and was confused found to be hypoxic in the mid 50s, placed on 5L O2. Had not worked with him previous days so unable to say re: his status prior to the fall.     Review of Systems:  ROS unable to complete due to AMS   Allergies  Allergen Reactions   Flomax [Tamsulosin Hcl] Other (See Comments)    This medication makes patient feel sick   Penicillins Other (See Comments)    Reaction unknown occurred during childhood Has patient had a PCN reaction causing immediate rash, facial/tongue/throat swelling, SOB or lightheadedness with hypotension: unsure - childhood reaction Has patient had a PCN reaction causing severe rash involving mucus membranes or skin necrosis: unsure - childhood reaction Has patient had a PCN reaction that required hospitalization no Has patient had a PCN reaction occurring within the last 10 years: no If all of the above answers are NO, then may p    Prior to Admission medications   Medication Sig Start Date End Date Taking? Authorizing Provider  albuterol  (VENTOLIN  HFA) 108 (90 Base) MCG/ACT inhaler Inhale 2 puffs into the lungs in the morning,  at noon, and at bedtime.    [provider]  apixaban  (ELIQUIS ) 5 MG TABS tablet Take 1 tablet (5 mg total) by mouth 2 (two) times daily. 03/03/21   [name], MD  atorvastatin  (LIPITOR) 40 MG tablet Take 1 tablet (40 mg total) by mouth daily. 12/22/18   Alexander, Natalie, DO  Baclofen  5 MG TABS Take 5 mg by mouth in the morning and at bedtime.    [provider]  blood glucose meter kit and supplies KIT Dispense based on patient and insurance preference. Use to check BG once daily or every other other.  Dx: E11.9 07/17/16   Lavell Bari LABOR, FNP  Dextromethorphan -guaiFENesin  (ROBITUSSIN COUGH+CHEST CONG DM) 10-200 MG CAPS Take 1 capsule by mouth in the morning, at noon, and at bedtime.    [provider]  escitalopram  (LEXAPRO ) 10 MG tablet Take 1 tablet (10 mg total) by mouth at bedtime. 03/03/21   [name], MD  gabapentin  (NEURONTIN ) 100 MG capsule Take 100 mg by mouth 2 (two) times daily.    [provider]  glipiZIDE  (GLUCOTROL ) 10 MG tablet Take 1 tablet (10 mg total) by mouth 2 (two) times daily before a meal. 12/22/18   [name], DO  isosorbide mononitrate (ISMO) 10 MG tablet Take 10 mg by mouth 2 (two) times daily. 06/07/21   [provider]  metFORMIN  (GLUCOPHAGE ) 1000 MG tablet Take 1,000 mg by mouth 2 (two) times daily. 12/04/21  [provider]  metFORMIN  (GLUCOPHAGE -XR) 500 MG 24 hr tablet TAKE 2 TABLETS BY MOUTH  DAILY WITH BREAKFAST Patient taking differently: Take 750 mg by mouth in the morning and at bedtime. 01/18/20   [facility] Bring, DO  metoprolol  succinate (TOPROL -XL) 25 MG 24 hr tablet Take 12.5 mg by mouth daily.    [provider]  Semaglutide  7 MG TABS Take 7 mg by mouth daily.    [provider]  sertraline  (ZOLOFT ) 50 MG tablet Take 1/2 tab (25mg ) by mouth at bedtime for 6 days then increase to 1 tab (50mg ) by mouth every night at bedtime. Patient not taking: Reported on  12/13/2021 01/22/20   Early, Sara E, NP  spironolactone (ALDACTONE) 25 MG tablet Take 25 mg by mouth as needed. 09/21/21   [provider]  Tiotropium Bromide-Olodaterol (STIOLTO RESPIMAT ) 2.5-2.5 MCG/ACT AERS Inhale 2 puffs into the lungs daily. 03/28/21   [name], MD  traMADol  (ULTRAM ) 50 MG tablet TAKE 1 TABLET BY MOUTH EVERY 6 HOURS AS NEEDED FOR SEVERE PAIN Patient not taking: Reported on 12/13/2021 02/24/20   [facility], DO  traZODone  (DESYREL ) 150 MG tablet Take 150 mg by mouth at bedtime. 12/11/21   [provider]    Past Medical History:  Diagnosis Date   Anxiety    Arthritis    Atrial fibrillation (HCC)    Atrial flutter (HCC)    Bipolar disorder (HCC)    CAD (coronary artery disease)    a. INF STEMI 07/04/10:  tx with thrombectomy + [facility] to [facility];  cath 07/04/10: dLM 10-20%, pLAD 40-50%, mLAD 20-30%, pRCA 30%, mRCA occluded and tx with PCI, EF 50% with inf HK. A Multilink   COPD (chronic obstructive pulmonary disease) (HCC)    Diabetes mellitus (HCC)    History of DVT (deep vein thrombosis)    HLD (hyperlipidemia)    Hypertension    Left above-knee amputee (HCC)    PAD (peripheral artery disease) (HCC)    Polysubstance abuse (HCC)    Prior hx   Tobacco abuse    Urinary frequency    Vitamin D  deficiency 09/05/2018    Past Surgical History:  Procedure Laterality Date   CARDIAC CATHETERIZATION     COLONOSCOPY N/A 12/29/2015   Procedure: COLONOSCOPY;  Surgeon: Claudis RAYMOND Rivet, MD;  Location: [facility];  Service: Endoscopy;  Laterality: N/A;  12:55   ELECTROCARDIOGRAM     Showed inferolateral ST-elevation and a code STEMI was activated. In the ER, he was treated with morphine , herarin, and 600 mg of Plavix. He was transferred emergently to [facility].    INGUINAL HERNIA REPAIR Left 07/26/2014   Procedure: OPEN REPAIR OF RECURENT LEFT INGUINAL HERNIA REPAIR WITH MESH;  Surgeon: Camellia Blush, MD;  Location: [facility];  Service: General;   Laterality: Left;   INSERTION OF MESH Bilateral 10/30/2013   Procedure: INSERTION OF MESH;  Surgeon: Camellia CHRISTELLA Blush, MD;  Location: [facility];  Service: General;  Laterality: Bilateral;   LAPAROSCOPIC INGUINAL HERNIA WITH UMBILICAL HERNIA Bilateral 10/30/2013   Procedure: laparoscopic repair left pantaloom hernia with mesh, laparoscopic right inguinal hernia with mesh, OPEN REPAIR OF UMBILICAL HERNIA ;  Surgeon: Camellia CHRISTELLA Blush, MD;  Location: [facility];  Service: General;  Laterality: Bilateral;   LAPAROSCOPY N/A 07/26/2014   Procedure: LAPAROSCOPY DIAGNOSTIC;  Surgeon: Camellia Blush, MD;  Location: [facility];  Service: General;  Laterality: N/A;   POLYPECTOMY  12/29/2015   Procedure: POLYPECTOMY;  Surgeon:  Claudis RAYMOND Rivet, MD;  [facility];  Service: Endoscopy;;  ascending colon, descending colon   stent placement     TOTAL HIP ARTHROPLASTY Right 05/10/2015   Procedure: RIGHT TOTAL HIP ARTHROPLASTY ANTERIOR APPROACH;  Surgeon: Donnice Car, MD;  Location: [facility];  Service: Orthopedics;  Laterality: Right;   VASECTOMY       reports that he has quit smoking. His smoking use included cigarettes. He has a 67.5 pack-year smoking history. He has never used smokeless tobacco. He reports that he does not drink alcohol  and does not use drugs.  Family History  Problem Relation Age of Onset   Coronary artery disease Father    Depression Father    Heart attack Father    Depression Mother      Physical Exam: Vitals:   07/26/23 0142 07/26/23 0200 07/26/23 0300 07/26/23 0430  BP: 105/65 103/60 118/65 116/72  Pulse: 68 68 74 71  Resp: (!) 22 (!) 22 (!) 23 (!) 24  Temp: (!) 97.5 F (36.4 C)     TempSrc: Oral     SpO2: 94% 95% 93% 93%    Gen: somnolent, awakens to mild nonpainful stim, chronically ill appearing   CV: Regular, normal S1, S2, no murmurs  Resp: Tachypneic, on NRB, sonorous respirations, coarse but no significant rales / wheezes anteriorly / limited posterior exam  Abd: Obese,  nondistended, normoactive, nontender GU: scrotal edema  MSK: L AKA. There is trace RLE edema   Skin: No rashes or lesions to exposed skin  Neuro: somnolent, awakens to mild nonpainful stim, a+o x self. Moving all extremities  Psych: encephalopathic    Data review:   Labs reviewed, notable for:    Bicarb 21, no AG,  Cr 2.21 (b/l 0.8) BG 170s  BNP 453 Has WBC 21, neutrophil predominance Hb 9.3 (no chronic anemia)   Micro:  Results for orders placed or performed during the hospital encounter of 02/12/21  Resp Panel by RT-PCR (Flu A&B, Covid) Nasopharyngeal Swab     Status: Abnormal   Collection Time: 02/12/21 12:20 PM   Specimen: Nasopharyngeal Swab; Nasopharyngeal(NP) swabs in vial transport medium  Result Value Ref Range Status   SARS Coronavirus 2 by RT PCR POSITIVE (A) NEGATIVE Final    Comment: (NOTE) SARS-CoV-2 target nucleic acids are DETECTED.  The SARS-CoV-2 RNA is generally detectable in upper respiratory specimens during the acute phase of infection. Positive results are indicative of the presence of the identified virus, but do not rule out bacterial infection or co-infection with other pathogens not detected by the test. Clinical correlation with patient history and other diagnostic information is necessary to determine patient infection status. The expected result is Negative.  Fact Sheet for Patients: BloggerCourse.com  Fact Sheet for Healthcare Providers: SeriousBroker.it  This test is not yet approved or cleared by the [place]  FDA and  has been authorized for detection and/or diagnosis of SARS-CoV-2 by FDA under an Emergency Use Authorization (EUA).  This EUA will remain in effect (meaning this test can be used) for the duration of  the COVID-19 declaration under Section 564(b)(1) of the A ct, 21 U.S.C. section 360bbb-3(b)(1), unless the authorization is terminated or revoked sooner.      Influenza A by PCR NEGATIVE NEGATIVE Final   Influenza B by PCR NEGATIVE NEGATIVE Final    Comment: (NOTE) The Xpert Xpress SARS-CoV-2/FLU/RSV plus assay is intended as an aid in the diagnosis of influenza from Nasopharyngeal swab specimens and should not be used  as a sole basis for treatment. Nasal washings and aspirates are unacceptable for Xpert Xpress SARS-CoV-2/FLU/RSV testing.  Fact Sheet for Patients: BloggerCourse.com  Fact Sheet for Healthcare Providers: SeriousBroker.it  This test is not yet approved or cleared by the [place]  FDA and has been authorized for detection and/or diagnosis of SARS-CoV-2 by FDA under an Emergency Use Authorization (EUA). This EUA will remain in effect (meaning this test can be used) for the duration of the COVID-19 declaration under Section 564(b)(1) of the Act, 21 U.S.C. section 360bbb-3(b)(1), unless the authorization is terminated or revoked.  Performed at [facility], [address], [place], [place] [address]   MRSA Next Gen by PCR, Nasal     Status: None   Collection Time: 02/12/21  6:30 PM   Specimen: Nasal Mucosa; Nasal Swab  Result Value Ref Range Status   MRSA by PCR Next Gen NOT DETECTED NOT DETECTED Final    Comment: (NOTE) The GeneXpert MRSA Assay (FDA approved for NASAL specimens only), is one component of a comprehensive MRSA colonization surveillance program. It is not intended to diagnose MRSA infection nor to guide or monitor treatment for MRSA infections. Test performance is not FDA approved in patients less than 12 years old. Performed at [facility], [address] [place], [place] [address]     Imaging reviewed:  CT CHEST WO CONTRAST Result Date: 07/26/2023 CLINICAL DATA:  73 year old male with recent fall. Pain. COPD. Respiratory failure. EXAM: CT CHEST WITHOUT CONTRAST TECHNIQUE: Multidetector CT imaging of the chest  was performed following the standard protocol without IV contrast. RADIATION DOSE REDUCTION: This exam was performed according to the departmental dose-optimization program which includes automated exposure control, adjustment of the mA and/or kV according to patient size and/or use of iterative reconstruction technique. COMPARISON:  Chest CT 02/12/2021. FINDINGS: Cardiovascular: Calcified aortic atherosclerosis. Advanced calcified coronary artery atherosclerosis and/or stent. Vascular patency is not evaluated in the absence of IV contrast. Mild cardiomegaly, not significantly changed. No pericardial effusion. Mediastinum/Nodes: Reactive appearing small but numerous mediastinal lymph nodes. Lungs/Pleura: Moderate to severe emphysema. Respiratory motion. Atelectatic changes to the major airways which are patent through the carina and mainstem bronchi. Bilateral lower lobe consolidation, and bilateral lower lobe and right middle lobe partial airway collapse or opacification. Consolidation also in the lateral segment of the right middle lobe. No pleural effusion. Underlying pulmonary vascularity appears normal. Upper Abdomen: Negative visible noncontrast liver, gallbladder, spleen, pancreas, adrenal glands, right kidney, bowel. No pneumoperitoneum or free fluid in the visible upper abdomen. Musculoskeletal: Stable thoracic vertebral height and alignment since 2023. Sternum and left shoulder osseous structures appear intact. Chronic medial right clavicle fracture is stable since 2023. Chronic left anterior, lateral, posterior left rib fractures. No acute left rib fracture is identified. Chronic right anterior rib fractures. Chronic, partially sclerotic but unhealed right posterolateral 9th rib fracture, was present in 2023. Widely displaced posterolateral right 10th rib fracture on series 4, image 134. That rib was not included previously, but this is favored to be chronic. And there are superimposed chronic fractures  of the right 9th, 10th, 11th costovertebral junctions. No acute superficial soft tissue injury identified. IMPRESSION: 1. Advanced Emphysema (ICD10-J43.9) with superimposed Bilateral lower lobe and Right middle lobe Consolidated Pneumonia. No pleural effusion. 2. Numerous chronic bilateral rib fractures. Chronic right clavicle fracture. No definite acute traumatic injury in the noncontrast Chest. 3.  Aortic Atherosclerosis (ICD10-I70.0). Electronically Signed   By: VEAR Hurst M.D.   On: 07/26/2023 06:06  CT Head Wo Contrast Result Date: 07/26/2023 CLINICAL DATA:  Recent fall with headaches and neck pain, initial encounter EXAM: CT HEAD WITHOUT CONTRAST CT CERVICAL SPINE WITHOUT CONTRAST TECHNIQUE: Multidetector CT imaging of the head and cervical spine was performed following the standard protocol without intravenous contrast. Multiplanar CT image reconstructions of the cervical spine were also generated. RADIATION DOSE REDUCTION: This exam was performed according to the departmental dose-optimization program which includes automated exposure control, adjustment of the mA and/or kV according to patient size and/or use of iterative reconstruction technique. COMPARISON:  None Available. FINDINGS: CT HEAD FINDINGS Brain: No evidence of acute infarction, hemorrhage, hydrocephalus, extra-axial collection or mass lesion/mass effect. Mild atrophic changes and chronic white matter ischemic changes are seen. Vascular: No hyperdense vessel or unexpected calcification. Skull: Normal. Negative for fracture or focal lesion. Sinuses/Orbits: No acute finding. Other: None. CT CERVICAL SPINE FINDINGS Alignment: Within normal limits. Skull base and vertebrae: 7 cervical segments are well visualized. Vertebral body height is well maintained. Mild osteophytic changes are seen. Facet hypertrophic changes are noted as well. No acute fracture or acute facet abnormality is noted. The odontoid is within normal limits. Soft tissues and  spinal canal: Findings soft tissue structures are within normal limits. Upper chest: Visualized lung apices show emphysematous changes and biapical scarring. Other: None IMPRESSION: CT of the head: No acute intracranial abnormality noted. Chronic atrophic and ischemic changes. CT of the cervical spine: Multilevel degenerative change without acute abnormality. Electronically Signed   By: Oneil Devonshire M.D.   On: 07/26/2023 02:52   CT Cervical Spine Wo Contrast Result Date: 07/26/2023 CLINICAL DATA:  Recent fall with headaches and neck pain, initial encounter EXAM: CT HEAD WITHOUT CONTRAST CT CERVICAL SPINE WITHOUT CONTRAST TECHNIQUE: Multidetector CT imaging of the head and cervical spine was performed following the standard protocol without intravenous contrast. Multiplanar CT image reconstructions of the cervical spine were also generated. RADIATION DOSE REDUCTION: This exam was performed according to the departmental dose-optimization program which includes automated exposure control, adjustment of the mA and/or kV according to patient size and/or use of iterative reconstruction technique. COMPARISON:  None Available. FINDINGS: CT HEAD FINDINGS Brain: No evidence of acute infarction, hemorrhage, hydrocephalus, extra-axial collection or mass lesion/mass effect. Mild atrophic changes and chronic white matter ischemic changes are seen. Vascular: No hyperdense vessel or unexpected calcification. Skull: Normal. Negative for fracture or focal lesion. Sinuses/Orbits: No acute finding. Other: None. CT CERVICAL SPINE FINDINGS Alignment: Within normal limits. Skull base and vertebrae: 7 cervical segments are well visualized. Vertebral body height is well maintained. Mild osteophytic changes are seen. Facet hypertrophic changes are noted as well. No acute fracture or acute facet abnormality is noted. The odontoid is within normal limits. Soft tissues and spinal canal: Findings soft tissue structures are within normal  limits. Upper chest: Visualized lung apices show emphysematous changes and biapical scarring. Other: None IMPRESSION: CT of the head: No acute intracranial abnormality noted. Chronic atrophic and ischemic changes. CT of the cervical spine: Multilevel degenerative change without acute abnormality. Electronically Signed   By: Oneil Devonshire M.D.   On: 07/26/2023 02:52   DG Chest Port 1 View Result Date: 07/26/2023 EXAM: 1 VIEW XRAY OF THE CHEST 07/26/2023 01:48:00 AM COMPARISON: 03/07/2021 CLINICAL HISTORY: Shortness of breath. Encounter for shortness of breath. FINDINGS: LUNGS AND PLEURA: Low lung volumes. Increased interstitial markings in the lower lung suspicious for edema. Retrocardiac airspace opacities. Right basilar atelectasis. Small left pleural effusion. No pneumothorax. HEART AND MEDIASTINUM: Accentuated cardiomediastinal silhouette  and pulmonary vascularity due to hypoinflation. BONES AND SOFT TISSUES: No acute osseous abnormality. IMPRESSION: 1. Increased interstitial markings in the lower lung suspicious for edema. 2. Small left pleural effusion. 3. Bibasilar atelectasis vs pneumonia. Electronically signed by: Norman Gatlin MD 07/26/2023 01:54 AM EDT RP Workstation: [id number]    EKG:  Personally reviewed, sinus rhythm with PVC, LAE, low voltage in limb leads, T wave inversion inferiorly  ED Course:  Arrived on nonrebreather, continued while in the ED.  Treated with Lasix  40 mg IV   Assessment/Plan:  73 y.o. male with hx ILD, COPD, CAD with hx inferior STEMI, RCA PCI, diastolic HF, paroxysmal A-fib, DVT, on anticoagulation, PAD with L AKA. diabetes type 2, hyperlipidemia, who was brought in from [name] after ground-level fall.  On EMS arrival hypoxic with sats in the 70s and placed on nonrebreather. Altered on arrival to ED   Acute hypoxic respiratory failure Community-acquired pneumonia, ? Aspiration with AMS  Question of heart failure  History of ILD, COPD Found to be hypoxic  at time of his fall with sats in mid 50s per [name] staff.  Placed on 5 L.  Remained hypoxic in the 70s at time of EMS arrival and placed on nonrebreather.  Remains on nonrebreather in the ED although with normal sat.  Labs notable for BNP 453.  WBC 21, neutrophil predominant.  Chest x-ray with interstitial markings in the lower lung fields with question of edema; however note that he has ILD.  Also noted bibasilar atelectasis versus pneumonia, small left pleural effusion.  CT chest to further delineate cause of his respiratory failure -> with evidence of bilateral lower lobe and RML consolidation consistent with pneumonia, no edema noted.  - S/p Lasix  40 mg IV in the ED, think near euvolemic. Hold on additional diuresis  - Start on ceftriaxone 1 g IV every 24 hours, azithromycin  500 mg IV every 24 hour  - Check flu/COVID/RSV, sputum culture, procalcitonin - Continue [name] stiolto equivalent. DuoNebs scheduled every 6 hours, albuterol  as needed, I-S/flutter valve as able - O2 sat goal ~90 - 92; trial switch to heated high flow nasal cannula -> if unable to maintain sat may need BiPAP but will need to assess mental status and aspiration risk. See below re: GOC.  - TTE to eval underlying HF   Encephalopathy, suspect toxic/metabolic Noted to be altered at time of fall, baseline unclear. On eval he is somnolent, A+O x self.  CT head negative for acute process.  Suspect toxic in setting of baclofen  use and AKI /other CNS depressing medications, as well as metabolic in setting of hypoxia and underlying infection. - Hold baclofen  for now. Reduced dose when restarted per renal function  - hold a.m. dose of gabapentin  and resume this evening - Hold trazodone  - Management of respiratory failure, infection per above - Delirium precautions, fall precautions - Currently in mitts due to interference with medical appointment  Acute myocardial injury CAD with hx inferior STEMI, RCA PCI EKG with nonspecific  ST-T wave changes.  High-sensitivity troponin 58 -> 59.  Suspect demand event in setting of hypoxia and infection.  - Management directed at respiratory failure/heart failure per above. - Continue [name] Eliquis  DOAC monotherapy for CAD/A-fib, atorvastatin    Afib with RVR;  Hx paroxysmal Afib. on anticoagulation Was in SR, but developed Afib with RVR rates in 110-120s.  -If unable to take p.o. can use metoprolol  5 mg IV every 4 hours as needed for rates greater than 120 -When able  resume [name] metoprolol  25 mg daily  -Continue [name] Eliquis  for now; if worsening anemia or evidence of GIB stop.  Borderline hypotension Intermittent borderline BP in the ED, systolic 90s with MAP > 65.  -Close monitoring after diuresis -> giving 500 cc back with worsening HR trend  -Hold [name] ISMN   Acute kidney injury, stage II Baseline creatinine approximately 0.8 although no recent labs.  Elevated to 2.2 on admission. Suspect dry  - Initial diuresis in ED, think near euvolemic. Hold on additional diuresis. close monitoring of renal function - Check PVR, UA  Ground-level fall, without injuries CT head C-spine without acute process.  No other injuries noted -As encephalopathy improves will need PT, ordered for tomorrow  Microcytic anemia Unclear onset. Remote labs in '23 without anemia. Down to 9.3 on admission, MCV 72.  -Check FOBT, iron panel, B12, folate  -For now continue anticoagulation.  If continued drop in hemoglobin would stop.   GOC  -Has MOST form at bedside with both full code and no intubation/ICU.  Will need further clarification.  Treating is full code for now.  Chronic medical problems: History of DVT: AC per above PAD, L AKA: See CAD re: antiplatelet/statin. Diabetes type 2: Hold [name] metformin , glipizide , Rybelsus .  SSI for very sensitive Hyperlipidemia: See CAD above. Mood disorder: Continue [name] escitalopram    There is no height or weight on file to calculate BMI.    DVT  prophylaxis:  Eliquis  Code Status:  Full Code Diet:  Diet Orders (From admission, onward)     Start     Ordered   07/26/23 0529  Diet NPO time specified Except for: Ice Chips, Sips with Meds  Diet effective now       Question Answer Comment  Except for Ice Chips   Except for Sips with Meds      07/26/23 0529           Family Communication:  Attempted to call daughter but phone off   Consults:  None   Admission status:   Inpatient, Step Down Unit  Severity of Illness: The appropriate patient status for this patient is INPATIENT. Inpatient status is judged to be reasonable and necessary in order to provide the required intensity of service to ensure the patient's safety. The patient's presenting symptoms, physical exam findings, and initial radiographic and laboratory data in the context of their chronic comorbidities is felt to place them at high risk for further clinical deterioration. Furthermore, it is not anticipated that the patient will be medically stable for discharge from the hospital within 2 midnights of admission.   * I certify that at the point of admission it is my clinical judgment that the patient will require inpatient hospital care spanning beyond 2 midnights from the point of admission due to high intensity of service, high risk for further deterioration and high frequency of surveillance required.*   Dorn Dawson, MD [facility]  How to contact the [facility] Attending or Consulting provider 7A - 7P or covering provider during after hours 7P -7A, for this patient.  Check the care team in [facility] and look for a) attending/consulting [facility] provider listed and b) the [facility] team listed Log into [email or web address] and use [facility] universal password to access. If you do not have the password, please contact the hospital operator. Locate the [facility] provider you are looking for under [facility] and page to a number that you can be directly reached. If you still have  difficulty reaching the provider,  please page the [facility] (Director on Call) for the Hospitalists listed on amion for assistance.  07/26/2023, 5:54 AM

## 2023-07-26 NOTE — ED Notes (Signed)
 Checked patient cbg it was 124 patient is resting with call bell in reach

## 2023-07-26 NOTE — ED Notes (Signed)
 CCMD called, pt added to monitor by Katrina.

## 2023-07-26 NOTE — ED Notes (Signed)
 CCMD notified of the move

## 2023-07-26 NOTE — ED Notes (Signed)
 ED Provider at bedside.

## 2023-07-26 NOTE — Evaluation (Signed)
 Physical Therapy Evaluation Patient Details Name: Kellie Chisolm. MRN: [id number] DOB: 1950/11/12 Today's Date: 07/26/2023  History of Present Illness  73 y.o. male presents to [facility] 07/26/23 from [place] nursing home after sliding off the bed with AMS and hypoxia. Admitted with acute hypoxic respiratory failure, PNA, and acute encephalopathy. PMHx:  ILD, COPD, CAD with hx inferior STEMI, RCA PCI, diastolic HF, paroxysmal A-fib, DVT, on anticoagulation, PAD with L AKA. diabetes type 2, hyperlipidemia   Clinical Impression  Pt supine on stretcher upon arrival and agreeable to PT eval. PTA, pt would perform stand-pivots to a manual WC with occasional assist needed to propel. In today's session, pt required ModA for bed mobility and was able to laterally scoot towards the [facility] with use of R LE, B UE and CGA. Increased rest breaks in between lateral scoots due to pt feeling SOB, vitals below. Deferred transfer due to symptoms. Recommending follow up rehab at [facility] when pt [facility]. Pt would benefit from acute skilled PT with current functional limitations listed below (see PT Problem List). Acute PT to follow.  HHFNC 35L, 91% FiO2 SpO2 >91% during session BP 115/71, 98 BPM        If plan is discharge home, recommend the following: A lot of help with walking and/or transfers;A lot of help with bathing/dressing/bathroom;Assistance with cooking/housework;Assist for transportation;Help with stairs or ramp for entrance   Can travel by private vehicle   No    Equipment Recommendations None recommended by PT     Functional Status Assessment Patient has had a recent decline in their functional status and demonstrates the ability to make significant improvements in function in a reasonable and predictable amount of time.     Precautions / Restrictions Precautions Precautions: Fall Precaution/Restrictions Comments: watch O2 Restrictions Weight Bearing Restrictions Per Provider Order: No       Mobility  Bed Mobility Overal bed mobility: Needs Assistance Bed Mobility: Rolling, Sidelying to Sit, Sit to Sidelying Rolling: Contact guard assist Sidelying to sit: Mod assist     Sit to sidelying: Min assist General bed mobility comments: ModA to raise trunk from sidelying, MinA for return to supine for slight LE management. Performed x5 lateral scoots towards HOB using RLE and BUE.    Transfers  General transfer comment: Deferred      Balance Overall balance assessment: Needs assistance, Mild deficits observed, not formally tested, History of Falls Sitting-balance support: Bilateral upper extremity supported, Feet supported Sitting balance-Leahy Scale: Fair        Pertinent Vitals/Pain Pain Assessment Pain Assessment: No/denies pain    Home Living Family/patient expects to be discharged to:: Assisted living [facility])    Home Equipment: Wheelchair - manual      Prior Function Prior Level of Function : Needs assist;History of Falls (last six months)    Mobility Comments: Stands and pivots to [facility] with no AD, sometimes has help to propel WC. At least 2 falls, pt unable to recall what happened ADLs Comments: Eats in dining area, reports ind with  dressing with staff close by for bathing     Extremity/Trunk Assessment   Upper Extremity Assessment Upper Extremity Assessment: Defer to OT evaluation    Lower Extremity Assessment Lower Extremity Assessment: LLE deficits/detail;Generalized weakness LLE Deficits / Details: hx of L AKA    Cervical / Trunk Assessment Cervical / Trunk Assessment: Normal  Communication   Communication Communication: No apparent difficulties    Cognition Arousal: Alert Behavior During Therapy: [facility]  for tasks assessed/performed   PT - Cognitive impairments: No apparent impairments    Following commands: Intact       Cueing Cueing Techniques: Verbal cues      PT Assessment Patient needs continued PT services  PT  Problem List Decreased strength;Decreased activity tolerance;Decreased balance;Decreased mobility       PT Treatment Interventions DME instruction;Gait training;Functional mobility training;Therapeutic activities;Therapeutic exercise;Balance training;Neuromuscular re-education;Patient/family education    PT Goals (Current goals can be found in the Care Plan section)  Acute Rehab PT Goals Patient Stated Goal: to feel better PT Goal Formulation: With patient Time For Goal Achievement: 08/09/23 Potential to Achieve Goals: Good    Frequency Min 2X/week        AM-PAC PT 6 Clicks Mobility  Outcome Measure Help needed turning from your back to your side while in a flat bed without using bedrails?: A Little Help needed moving from lying on your back to sitting on the side of a flat bed without using bedrails?: A Lot Help needed moving to and from a bed to a chair (including a wheelchair)?: A Lot Help needed standing up from a chair using your arms (e.g., wheelchair or bedside chair)?: A Lot Help needed to walk in hospital room?: Total Help needed climbing 3-5 steps with a railing? : Total 6 Click Score: 11    End of Session Equipment Utilized During Treatment: Oxygen Activity Tolerance: Patient tolerated treatment well Patient left: in bed;with call bell/phone within reach Nurse Communication: Mobility status PT Visit Diagnosis: Other abnormalities of gait and mobility (R26.89);Muscle weakness (generalized) (M62.81);History of falling (Z91.81)    Time: 8661-8642 PT Time Calculation (min) (ACUTE ONLY): 19 min   Charges:   PT Evaluation $PT Eval Low Complexity: 1 Low   PT General Charges $$ ACUTE PT VISIT: 1 Visit       Kate ORN, PT, DPT Secure Chat Preferred  Rehab Office [phone]   Kate BRAVO Wendolyn 07/26/2023, 3:40 PM

## 2023-07-26 NOTE — ED Triage Notes (Signed)
 Pt arrived from [facility] and rehab for unwitnessed fall. Initial RA sats 70%, placed on NRB by EMS. BP 90/50 on arrival. Bilateral AC IVs On arrival to check in, pt difficulty to arouse. Then able to [facility] name and place

## 2023-07-26 NOTE — Progress Notes (Signed)
  Echocardiogram 2D Echocardiogram has been performed.  Anthony [name] 07/26/2023, 12:26 PM

## 2023-07-26 NOTE — ED Notes (Signed)
 Patient discovered not wearing Foxworth by this RN. This RN placed nasal cannula back and patient and educated patient on importance of keeping Fisher on. Pt verbalized understanding.

## 2023-07-26 NOTE — Plan of Care (Signed)

## 2023-07-26 NOTE — ED Notes (Signed)
 Admitting MD at bedside. MD notified pt too altered for PO meds.

## 2023-07-26 NOTE — ED Notes (Signed)
 Resp at bedside

## 2023-07-27 ENCOUNTER — Inpatient Hospital Stay (HOSPITAL_COMMUNITY)

## 2023-07-27 DIAGNOSIS — J9601 Acute respiratory failure with hypoxia: Secondary | ICD-10-CM | POA: Diagnosis not present

## 2023-07-27 LAB — BASIC METABOLIC PANEL WITH GFR
Anion gap: 18 — ABNORMAL HIGH (ref 5–15)
BUN: 32 mg/dL — ABNORMAL HIGH (ref 8–23)
CO2: 24 mmol/L (ref 22–32)
Calcium: 9 mg/dL (ref 8.9–10.3)
Chloride: 97 mmol/L — ABNORMAL LOW (ref 98–111)
Creatinine, Ser: 1.46 mg/dL — ABNORMAL HIGH (ref 0.61–1.24)
GFR, Estimated: 50 mL/min — ABNORMAL LOW (ref >60)
Glucose, Bld: 172 mg/dL — ABNORMAL HIGH (ref 70–99)
Potassium: 4.4 mmol/L (ref 3.5–5.1)
Sodium: 139 mmol/L (ref 135–145)

## 2023-07-27 LAB — MRSA NEXT GEN BY PCR, NASAL: MRSA by PCR Next Gen: NOT DETECTED

## 2023-07-27 LAB — CBC WITH DIFFERENTIAL/PLATELET
Abs Immature Granulocytes: 0.09 K/uL — ABNORMAL HIGH (ref 0.00–0.07)
Basophils Absolute: 0 K/uL (ref 0.0–0.1)
Basophils Relative: 0 %
Eosinophils Absolute: 0 K/uL (ref 0.0–0.5)
Eosinophils Relative: 0 %
HCT: 35.3 % — ABNORMAL LOW (ref 39.0–52.0)
Hemoglobin: 9.9 g/dL — ABNORMAL LOW (ref 13.0–17.0)
Immature Granulocytes: 1 %
Lymphocytes Relative: 3 %
Lymphs Abs: 0.5 K/uL — ABNORMAL LOW (ref 0.7–4.0)
MCH: 19.9 pg — ABNORMAL LOW (ref 26.0–34.0)
MCHC: 28 g/dL — ABNORMAL LOW (ref 30.0–36.0)
MCV: 70.9 fL — ABNORMAL LOW (ref 80.0–100.0)
Monocytes Absolute: 0.4 K/uL (ref 0.1–1.0)
Monocytes Relative: 3 %
Neutro Abs: 14.6 K/uL — ABNORMAL HIGH (ref 1.7–7.7)
Neutrophils Relative %: 93 %
Platelets: 377 K/uL (ref 150–400)
RBC: 4.98 MIL/uL (ref 4.22–5.81)
RDW: 19.7 % — ABNORMAL HIGH (ref 11.5–15.5)
WBC: 15.6 K/uL — ABNORMAL HIGH (ref 4.0–10.5)
nRBC: 0 % (ref 0.0–0.2)

## 2023-07-27 LAB — LIPID PANEL
Cholesterol: 97 mg/dL (ref 0–200)
HDL: 44 mg/dL (ref >40)
LDL Cholesterol: 40 mg/dL (ref 0–99)
Total CHOL/HDL Ratio: 2.2 ratio
Triglycerides: 67 mg/dL (ref <150)
VLDL: 13 mg/dL (ref 0–40)

## 2023-07-27 LAB — RENAL FUNCTION PANEL
Albumin: 3.2 g/dL — ABNORMAL LOW (ref 3.5–5.0)
Anion gap: 17 — ABNORMAL HIGH (ref 5–15)
BUN: 31 mg/dL — ABNORMAL HIGH (ref 8–23)
CO2: 24 mmol/L (ref 22–32)
Calcium: 9.1 mg/dL (ref 8.9–10.3)
Chloride: 98 mmol/L (ref 98–111)
Creatinine, Ser: 1.45 mg/dL — ABNORMAL HIGH (ref 0.61–1.24)
GFR, Estimated: 51 mL/min — ABNORMAL LOW (ref >60)
Glucose, Bld: 173 mg/dL — ABNORMAL HIGH (ref 70–99)
Phosphorus: 4.1 mg/dL (ref 2.5–4.6)
Potassium: 4.4 mmol/L (ref 3.5–5.1)
Sodium: 139 mmol/L (ref 135–145)

## 2023-07-27 LAB — GLUCOSE, CAPILLARY
Glucose-Capillary: 172 mg/dL — ABNORMAL HIGH (ref 70–99)
Glucose-Capillary: 247 mg/dL — ABNORMAL HIGH (ref 70–99)
Glucose-Capillary: 279 mg/dL — ABNORMAL HIGH (ref 70–99)
Glucose-Capillary: 163 mg/dL — ABNORMAL HIGH (ref 70–99)

## 2023-07-27 LAB — PROCALCITONIN: Procalcitonin: 1.25 ng/mL

## 2023-07-27 LAB — TSH: TSH: 0.77 u[IU]/mL (ref 0.350–4.500)

## 2023-07-27 LAB — IRON AND TIBC
Iron: 8 ug/dL — ABNORMAL LOW (ref 45–182)
Saturation Ratios: 2 % — ABNORMAL LOW (ref 17.9–39.5)
TIBC: 372 ug/dL (ref 250–450)
UIBC: 364 ug/dL

## 2023-07-27 LAB — HEMOGLOBIN A1C
Hgb A1c MFr Bld: 6.5 % — ABNORMAL HIGH (ref 4.8–5.6)
Mean Plasma Glucose: 139.85 mg/dL

## 2023-07-27 LAB — PHOSPHORUS: Phosphorus: 4 mg/dL (ref 2.5–4.6)

## 2023-07-27 LAB — FERRITIN: Ferritin: 39 ng/mL (ref 24–336)

## 2023-07-27 LAB — FOLATE: Folate: 6.6 ng/mL (ref >5.9)

## 2023-07-27 LAB — MAGNESIUM: Magnesium: 2 mg/dL (ref 1.7–2.4)

## 2023-07-27 LAB — VITAMIN B12: Vitamin B-12: 187 pg/mL (ref 180–914)

## 2023-07-27 LAB — TRANSFERRIN: Transferrin: 259 mg/dL (ref 180–329)

## 2023-07-27 MED ORDER — LEVALBUTEROL HCL 0.63 MG/3ML IN NEBU: 0.6300 mg | INHALATION_SOLUTION | Freq: Four times a day (QID) | RESPIRATORY_TRACT | Status: DC | PRN

## 2023-07-27 MED ORDER — IPRATROPIUM BROMIDE 0.02 % IN SOLN
0.5000 mg | Freq: Four times a day (QID) | RESPIRATORY_TRACT | Status: DC
  Administered 2023-07-27 – 2023-07-28 (×6): 0.5 mg via RESPIRATORY_TRACT
  Filled 2023-07-27 (×6): qty 2.5

## 2023-07-27 MED ORDER — LEVALBUTEROL HCL 0.63 MG/3ML IN NEBU
0.6300 mg | INHALATION_SOLUTION | Freq: Four times a day (QID) | RESPIRATORY_TRACT | Status: DC
  Administered 2023-07-27 – 2023-07-28 (×5): 0.63 mg via RESPIRATORY_TRACT
  Filled 2023-07-27 (×5): qty 3

## 2023-07-27 NOTE — Progress Notes (Signed)
 Patient HR back in a-fib with rvr in 130's 5mg  of IV metoprolol  given.

## 2023-07-27 NOTE — Progress Notes (Signed)
 Patient went into a-fib RVR HR 130's-140's. EKG done and in chart. MD paged and given orders for 5mg  metoprolol . Before IV metoprolol  given patient back in NSR in the 80's. Will continue to monitor.

## 2023-07-27 NOTE — Plan of Care (Signed)

## 2023-07-27 NOTE — Evaluation (Signed)
 Clinical/Bedside Swallow Evaluation Patient Details  Name: Anthony [name]. MRN: [id number] Date of Birth: [birth year]  Today's Date: 07/27/2023 Time: SLP Start Time (ACUTE ONLY): 1105 SLP Stop Time (ACUTE ONLY): 1125 SLP Time Calculation (min) (ACUTE ONLY): 20 min  Past Medical History:  Past Medical History:  Diagnosis Date   Anxiety    Arthritis    Atrial fibrillation (HCC)    Atrial flutter (HCC)    Bipolar disorder (HCC)    CAD (coronary artery disease)    a. INF STEMI 07/04/10:  tx with thrombectomy + [facility] BMS to [facility];  cath 07/04/10: dLM 10-20%, pLAD 40-50%, mLAD 20-30%, pRCA 30%, mRCA occluded and tx with PCI, EF 50% with inf HK. A Multilink   COPD (chronic obstructive pulmonary disease) (HCC)    Diabetes mellitus (HCC)    History of DVT (deep vein thrombosis)    HLD (hyperlipidemia)    Hypertension    Left above-knee amputee (HCC)    PAD (peripheral artery disease) (HCC)    Polysubstance abuse (HCC)    Prior hx   Tobacco abuse    Urinary frequency    Vitamin D  deficiency 09/05/2018   Past Surgical History:  Past Surgical History:  Procedure Laterality Date   CARDIAC CATHETERIZATION     COLONOSCOPY N/A 12/29/2015   Procedure: COLONOSCOPY;  Surgeon: Claudis RAYMOND Rivet, MD;  Location: [facility];  Service: Endoscopy;  Laterality: N/A;  12:55   ELECTROCARDIOGRAM     Showed inferolateral ST-elevation and a code STEMI was activated. In the ER, he was treated with morphine , herarin, and 600 mg of Plavix. He was transferred emergently to [facility].    INGUINAL HERNIA REPAIR Left 07/26/2014   Procedure: OPEN REPAIR OF RECURENT LEFT INGUINAL HERNIA REPAIR WITH MESH;  Surgeon: Camellia Blush, MD;  Location: [facility];  Service: General;  Laterality: Left;   INSERTION OF MESH Bilateral 10/30/2013   Procedure: INSERTION OF MESH;  Surgeon: Camellia CHRISTELLA Blush, MD;  Location: [facility];  Service: General;  Laterality: Bilateral;   LAPAROSCOPIC INGUINAL HERNIA WITH UMBILICAL HERNIA  Bilateral 10/30/2013   Procedure: laparoscopic repair left pantaloom hernia with mesh, laparoscopic right inguinal hernia with mesh, OPEN REPAIR OF UMBILICAL HERNIA ;  Surgeon: Camellia CHRISTELLA Blush, MD;  Location: [facility];  Service: General;  Laterality: Bilateral;   LAPAROSCOPY N/A 07/26/2014   Procedure: LAPAROSCOPY DIAGNOSTIC;  Surgeon: Camellia Blush, MD;  Location: [facility];  Service: General;  Laterality: N/A;   POLYPECTOMY  12/29/2015   Procedure: POLYPECTOMY;  Surgeon: Claudis RAYMOND Rivet, MD;  Location: [facility];  Service: Endoscopy;;  ascending colon, descending colon   stent placement     TOTAL HIP ARTHROPLASTY Right 05/10/2015   Procedure: RIGHT TOTAL HIP ARTHROPLASTY ANTERIOR APPROACH;  Surgeon: Donnice Car, MD;  Location: [facility];  Service: Orthopedics;  Laterality: Right;   VASECTOMY     HPI:  73 y.o. male presents to [facility] 07/26/23 from [place] nursing home after sliding off the bed with AMS and hypoxia. Admitted with acute hypoxic respiratory failure, PNA, and acute encephalopathy. PMHx:  ILD, COPD, CAD with hx inferior STEMI, RCA PCI, diastolic HF, paroxysmal A-fib, DVT, on anticoagulation, PAD with L AKA. diabetes type 2, hyperlipidemia. Pt denies dysphagia - admits to occasional issues with coughing on food - denies liquid dysphagia.  Has h/o prior GER symptoms when younger but states they resolved.  No dysphagia reported PTA and pt eats regular *carb mod* thin diet.     Assessment /  Plan / Recommendation  Clinical Impression  Functional oropharyngeal swallow clinically noted.  No S/S of aspiration and pt easily passed 3 ounce Yale water  challenge with exhalation post swallow.  He is edentulous but reports eating a normal diet PTA and not having any issues masticating.  Pt consumed container of applesauce, graham cracker packet, 5 ounces of water  with timely swallow, adequate oral clearance and exhalation post-swallow. Slight white coating on bilateral posterior tongue that pt denies discomfort -  not observed on buccal region. Advised pt brush his tongue/gums at least nightly to decrease bacteria load.  Pt and daughter agreeable to plan. Recommend regular/thin diet. No SLP follow up indicated. Thanks for this orde.r SLP Visit Diagnosis: Dysphagia, unspecified (R13.10)    Aspiration Risk  Mild aspiration risk    Diet Recommendation Regular;Thin liquid    Liquid Administration via: Cup;Straw Medication Administration: Whole meds with liquid Supervision: Patient able to self feed Compensations: Slow rate;Small sips/bites Postural Changes: Seated upright at 90 degrees;Remain upright for at least 30 minutes after po intake    Other  Recommendations Oral Care Recommendations: Oral care BID     Assistance Recommended at Discharge  N/a  Functional Status Assessment Patient has not had a recent decline in their functional status  Frequency and Duration     N/a       Prognosis   N/a     Swallow Study   General Date of Onset: 07/27/23 HPI: 73 y.o. male presents to [name] 07/26/23 from [name] after sliding off the bed with AMS and hypoxia. Admitted with acute hypoxic respiratory failure, PNA, and acute encephalopathy. PMHx:  ILD, COPD, CAD with hx inferior STEMI, RCA PCI, diastolic HF, paroxysmal A-fib, DVT, on anticoagulation, PAD with L AKA. diabetes type 2, hyperlipidemia. Pt denies dysphagia - admits to occasional issues with coughing on food - denies liquid dysphagia.  Has h/o prior GER symptoms when younger but states they resolved. Type of Study: Bedside Swallow Evaluation Previous Swallow Assessment: none in chart Diet Prior to this Study: NPO Temperature Spikes Noted: No Respiratory Status: Nasal cannula History of Recent Intubation: No Behavior/Cognition: Alert;Cooperative;Pleasant mood Oral Cavity Assessment: Within Functional Limits;Other (comment) (tongue has posterior white coating, not observed in buccal region) Oral Cavity - Dentition: Edentulous Vision:  Functional for self-feeding Self-Feeding Abilities: Able to feed self Patient Positioning: Upright in bed Baseline Vocal Quality: Normal Volitional Cough:  (DNT due to pt discomfort) Volitional Swallow: Able to elicit    Oral/Motor/Sensory Function Overall Oral Motor/Sensory Function: Within functional limits   Ice Chips Ice chips: Within functional limits Presentation: Spoon   Thin Liquid Thin Liquid: Within functional limits Presentation: Spoon;Straw;Self Fed;Cup    Nectar Thick Nectar Thick Liquid: Not tested   Honey Thick Honey Thick Liquid: Not tested   Puree Puree: Within functional limits Presentation: Self Fed;Spoon   Solid     Solid: Within functional limits Presentation: Carollynn Nicolas Emmie Jenkins 07/27/2023,11:32 AM  Madelin POUR, MS [facility] SLP Acute Rehab Services Office [phone]

## 2023-07-27 NOTE — Progress Notes (Signed)
 PROGRESS NOTE    Anthony [name].  FMW:[id number] DOB: [birth year] DOA: 07/26/2023 PCP: No primary care provider on file.  Outpatient Specialists:     Brief Narrative:  Patient is a 73 year old male, morbidly obese, with past medical history significant for interstitial lung disease, COPD, COVID infection, coronary artery disease, diastolic heart failure, paroxysmal atrial fibrillation, DVT, peripheral artery disease s/p left above-knee amputation, diabetes mellitus type 2 and hyperlipidemia.  Patient is a skilled nursing facility resident.  Patient was admitted with altered mentation, acute on chronic respiratory failure, COPD with exacerbation, and multilobar pneumonia.  07/26/2023: Patient was admitted earlier today.  Patient seems to be more responsive.  Discussed with patient's daughter, Anthony Potters, who happens to be patient's power of attorney.  Ms. Potters has confirmed that patient is DO NOT RESUSCITATE.  Will change to CODE STATUS.  Patient remains critically ill.  07/27/2023: Patient seen alongside patient's nurse.  Patient looks a lot better today.  Patient is more communicative.  Patient is reported to have demented illness (as per patient's daughter).  Patient is able to give details.  Patient remains on 100% of supplemental oxygen.  Will wean down supplemental oxygen as tolerated.  Assessment & Plan:   Principal Problem:   Acute exacerbation of CHF (congestive heart failure) (HCC) Active Problems:   Acute respiratory failure with hypoxemia (HCC)   Acute on chronic hypoxic respiratory failure: COPD with exacerbation/ILD: - Continue Xopenex  0.63 mg every 6 hours, and every 6 hours as needed. -Continue nebs at 3 PM 0.5 Mg every 6 hourly, IV Solu-Medrol  40 Mg every 6 hours and as Pulmicort  0.25 Mg twice daily. -Complete course of nebs hypertonic saline. - Deep suctioning. - Incentive spirometry and flutter valve device therapy as tolerated.   -Continue supplemental oxygen. -  Patient is DNR/DNI.  Possible CHF with exacerbation: - Complete course of IV Lasix . - Monitor closely and administer diuretics as needed.  - Volume status is difficult to assess in this patient. - See echo result.  Multilobar pneumonia: - Follow-up with results of blood culture and sputum culture. - Continue antibiotics (azithromycin  and ceftriaxone ). - Nasal swab for MRSA came back negative.  Respiratory viral panel came back negative.  Acute encephalopathy, metabolic, cannot rule out toxic: - Likely multifactorial. - Improved significantly.  History of PAD: - Status post left AKA.     DVT prophylaxis: Continue apixaban . Code Status: DO NOT RESUSCITATE/DO NOT INTUBATE Family Communication: Patient's daughter, Anthony [name]. Disposition Plan: Patient remains inpatient.   Consultants:  None.  Procedures:  Echo revealed: 1. Difficult acoustic windows Overall LV function is normal to mildly  decreased Would recomm limited imaging with Definity to evaluate regional  wall motion and overall LV function. . The left ventricle has no regional  wall motion abnormalities. Left  ventricular diastolic parameters were normal.   2. Right ventricular systolic function reduced. The right ventricular  size is normal.   3. The mitral valve is normal in structure. Trivial mitral valve  regurgitation.   4. The aortic valve is tricuspid. Aortic valve regurgitation is not  visualized. Aortic valve sclerosis/calcification is present, without any  evidence of aortic stenosis.   5. The inferior vena cava is dilated in size with <50% respiratory  variability, suggesting right atrial pressure of 15 mmHg.   Antimicrobials:  IV azithromycin . IV Rocephin .   Subjective: -No new complaints. - Since more communicative.  Objective: Vitals:   07/26/23 2306 07/27/23 0137 07/27/23 0343 07/27/23 0810  BP: 104/65 103/69 ROLLEN)  100/50 (!) 99/58  Pulse: 95 (!) 130 98 66  Resp: 17 20 20 18   Temp: 98  F (36.7 C)   97.9 F (36.6 C)  TempSrc: Oral   Oral  SpO2: 92% 90% 94% 90%  Weight:   80.2 kg     Intake/Output Summary (Last 24 hours) at 07/27/2023 0844 Last data filed at 07/27/2023 9385 Gross per 24 hour  Intake 450.84 ml  Output 1250 ml  Net -799.16 ml   Filed Weights   07/27/23 0343  Weight: 80.2 kg    Examination:  General exam: Patient is obese.  Patient is awake and alert.  Patient is more communicative.  Respiratory system: Significantly improved air entry.   Cardiovascular system: S1 & S2  Gastrointestinal system: Abdomen is obese, soft and nontender.   Central nervous system: Awake and alert.  Extremities: Status post left AKA.  Data Reviewed: I have personally reviewed following labs and imaging studies  CBC: Recent Labs  Lab 07/26/23 0201 07/26/23 0210 07/27/23 0721  WBC  --  21.8* 15.6*  NEUTROABS  --  19.0* 14.6*  HGB 11.2* 9.3* 9.9*  HCT 33.0* 34.5* 35.3*  MCV  --  72.9* 70.9*  PLT  --  299 377   Basic Metabolic Panel: Recent Labs  Lab 07/26/23 0201 07/26/23 0210 07/27/23 0721  NA 136 134* 139  139  K 4.8 4.8 4.4  4.4  CL  --  102 97*  98  CO2  --  21* 24  24  GLUCOSE  --  175* 172*  173*  BUN  --  24* 32*  31*  CREATININE  --  2.21* 1.46*  1.45*  CALCIUM   --  9.0 9.0  9.1  MG  --   --  2.0  PHOS  --   --  4.0  4.1   GFR: CrCl cannot be calculated (Unknown ideal weight.). Liver Function Tests: Recent Labs  Lab 07/26/23 0210 07/27/23 0721  AST 23  --   ALT 14  --   ALKPHOS 57  --   BILITOT 0.9  --   PROT 7.2  --   ALBUMIN  3.3* 3.2*   No results for input(s): LIPASE, AMYLASE in the last 168 hours. No results for input(s): AMMONIA in the last 168 hours. Coagulation Profile: No results for input(s): INR, PROTIME in the last 168 hours. Cardiac Enzymes: No results for input(s): CKTOTAL, CKMB, CKMBINDEX, TROPONINI in the last 168 hours. BNP (last 3 results) No results for input(s): PROBNP in the  last 8760 hours. HbA1C: Recent Labs    07/27/23 0721  HGBA1C 6.5*   CBG: Recent Labs  Lab 07/26/23 0746 07/26/23 1218 07/26/23 1636 07/26/23 2112 07/27/23 0615  GLUCAP 162* 124* 168* 201* 172*   Lipid Profile: Recent Labs    07/27/23 0721  CHOL 97  HDL 44  LDLCALC 40  TRIG 67  CHOLHDL 2.2   Thyroid  Function Tests: Recent Labs    07/27/23 0721  TSH 0.770   Anemia Panel: Recent Labs    07/27/23 0721  VITAMINB12 187  FOLATE 6.6  FERRITIN 39  TIBC 372  IRON 8*   Urine analysis:    Component Value Date/Time   COLORURINE YELLOW 07/26/2023 0621   APPEARANCEUR HAZY (A) 07/26/2023 0621   LABSPEC 1.014 07/26/2023 0621   PHURINE 5.0 07/26/2023 0621   GLUCOSEU 50 (A) 07/26/2023 0621   HGBUR SMALL (A) 07/26/2023 0621   BILIRUBINUR NEGATIVE 07/26/2023 0621   KETONESUR NEGATIVE 07/26/2023 9378  PROTEINUR 30 (A) 07/26/2023 0621   UROBILINOGEN 0.2 05/05/2013 0633   NITRITE NEGATIVE 07/26/2023 0621   LEUKOCYTESUR NEGATIVE 07/26/2023 9378   Sepsis Labs: @LABRCNTIP (procalcitonin:4,lacticidven:4)  ) Recent Results (from the past 240 hours)  Resp panel by RT-PCR (RSV, Flu A&B, Covid) Anterior Nasal Swab     Status: None   Collection Time: 07/26/23  5:32 AM   Specimen: Anterior Nasal Swab  Result Value Ref Range Status   SARS Coronavirus 2 by RT PCR NEGATIVE NEGATIVE Final   Influenza A by PCR NEGATIVE NEGATIVE Final   Influenza B by PCR NEGATIVE NEGATIVE Final    Comment: (NOTE) The Xpert Xpress SARS-CoV-2/FLU/RSV plus assay is intended as an aid in the diagnosis of influenza from Nasopharyngeal swab specimens and should not be used as a sole basis for treatment. Nasal washings and aspirates are unacceptable for Xpert Xpress SARS-CoV-2/FLU/RSV testing.  Fact Sheet for Patients: BloggerCourse.com  Fact Sheet for Healthcare Providers: SeriousBroker.it  This test is not yet approved or cleared by the [place] FDA and has been authorized for detection and/or diagnosis of SARS-CoV-2 by FDA under an Emergency Use Authorization (EUA). This EUA will remain in effect (meaning this test can be used) for the duration of the COVID-19 declaration under Section 564(b)(1) of the Act, 21 U.S.C. section 360bbb-3(b)(1), unless the authorization is terminated or revoked.     Resp Syncytial Virus by PCR NEGATIVE NEGATIVE Final    Comment: (NOTE) Fact Sheet for Patients: BloggerCourse.com  Fact Sheet for Healthcare Providers: SeriousBroker.it  This test is not yet approved or cleared by the [place]  FDA and has been authorized for detection and/or diagnosis of SARS-CoV-2 by FDA under an Emergency Use Authorization (EUA). This EUA will remain in effect (meaning this test can be used) for the duration of the COVID-19 declaration under Section 564(b)(1) of the Act, 21 U.S.C. section 360bbb-3(b)(1), unless the authorization is terminated or revoked.  Performed at [facility], [address] [place], [place] [address]   Culture, blood (Routine X 2) w Reflex to ID Panel     Status: None (Preliminary result)   Collection Time: 07/26/23  5:24 PM   Specimen: BLOOD LEFT HAND  Result Value Ref Range Status   Specimen Description BLOOD LEFT HAND  Final   Special Requests   Final    BOTTLES DRAWN AEROBIC AND ANAEROBIC Blood Culture results may not be optimal due to an inadequate volume of blood received in culture bottles   Culture   Final    NO GROWTH < 12 HOURS Performed at [facility], [address], [place], [place] [address]    Report Status PENDING  Incomplete  Culture, blood (Routine X 2) w Reflex to ID Panel     Status: None (Preliminary result)   Collection Time: 07/26/23  5:26 PM   Specimen: BLOOD RIGHT HAND  Result Value Ref Range Status   Specimen Description BLOOD RIGHT HAND  Final   Special Requests   Final     BOTTLES DRAWN AEROBIC AND ANAEROBIC Blood Culture results may not be optimal due to an inadequate volume of blood received in culture bottles   Culture   Final    NO GROWTH < 12 HOURS Performed at [facility], [address], [place], [place] [address]    Report Status PENDING  Incomplete  Respiratory (~20 pathogens) panel by PCR     Status: None   Collection Time: 07/26/23  5:55 PM  Specimen: Nasopharyngeal Swab; Respiratory  Result Value Ref Range Status   Adenovirus NOT DETECTED NOT DETECTED Final   Coronavirus 229E NOT DETECTED NOT DETECTED Final    Comment: (NOTE) The Coronavirus on the Respiratory Panel, DOES NOT test for the novel  Coronavirus (2019 nCoV)    Coronavirus HKU1 NOT DETECTED NOT DETECTED Final   Coronavirus NL63 NOT DETECTED NOT DETECTED Final   Coronavirus OC43 NOT DETECTED NOT DETECTED Final   Metapneumovirus NOT DETECTED NOT DETECTED Final   Rhinovirus / Enterovirus NOT DETECTED NOT DETECTED Final   Influenza A NOT DETECTED NOT DETECTED Final   Influenza B NOT DETECTED NOT DETECTED Final   Parainfluenza Virus 1 NOT DETECTED NOT DETECTED Final   Parainfluenza Virus 2 NOT DETECTED NOT DETECTED Final   Parainfluenza Virus 3 NOT DETECTED NOT DETECTED Final   Parainfluenza Virus 4 NOT DETECTED NOT DETECTED Final   Respiratory Syncytial Virus NOT DETECTED NOT DETECTED Final   Bordetella pertussis NOT DETECTED NOT DETECTED Final   Bordetella Parapertussis NOT DETECTED NOT DETECTED Final   Chlamydophila pneumoniae NOT DETECTED NOT DETECTED Final   Mycoplasma pneumoniae NOT DETECTED NOT DETECTED Final    Comment: Performed at [facility], [address] [place], [place] [address]         Radiology Studies: ECHOCARDIOGRAM COMPLETE Result Date: 07/26/2023    ECHOCARDIOGRAM REPORT   Patient Name:   Anthony [name]. Date of Exam: 07/26/2023 Medical Rec #:  [id number]          Height:       68.0 in Accession #:    [id number]         Weight:        203.0 lb Date of Birth:  January 29, 1950          BSA:          2.057 m Patient Age:    72 years           BP:           108/71 mmHg Patient Gender: M                  HR:           76 bpm. Exam Location:  Inpatient Procedure: 2D Echo (Both Spectral and Color Flow Doppler were utilized during            procedure). Indications:    congestive heart failure  History:        Patient has no prior history of Echocardiogram examinations,                 most recent 02/21/2021. CAD, COPD; Risk Factors:Hypertension,                 Dyslipidemia and Diabetes.  Sonographer:    Tinnie Barefoot RDCS Referring Phys: [id number] DORN DAWSON  Sonographer Comments: Image acquisition challenging due to respiratory motion and Image acquisition challenging due to COPD. IMPRESSIONS  1. Difficult acoustic windows Overall LV function is normal to mildly decreased Would recomm limited imaging with Definity to evaluate regional wall motion and overall LV function. . The left ventricle has no regional wall motion abnormalities. Left ventricular diastolic parameters were normal.  2. Right ventricular systolic function reduced. The right ventricular size is normal.  3. The mitral valve is normal in structure. Trivial mitral valve regurgitation.  4. The aortic valve is tricuspid. Aortic valve regurgitation is not visualized. Aortic valve sclerosis/calcification is present, without any  evidence of aortic stenosis.  5. The inferior vena cava is dilated in size with <50% respiratory variability, suggesting right atrial pressure of 15 mmHg. FINDINGS  Left Ventricle: Difficult acoustic windows Overall LV function is normal to mildly decreased Would recomm limited imaging with Definity to evaluate regional wall motion and overall LV function. The left ventricle has no regional wall motion abnormalities. The left ventricular internal cavity size was normal in size. There is no left ventricular hypertrophy. Left ventricular diastolic parameters were  normal. Right Ventricle: The right ventricular size is normal. Right vetricular wall thickness was not assessed. Right ventricular systolic function reduced. Left Atrium: Left atrial size was normal in size. Right Atrium: Right atrial size was normal in size. Pericardium: There is no evidence of pericardial effusion. Mitral Valve: The mitral valve is normal in structure. Trivial mitral valve regurgitation. Tricuspid Valve: The tricuspid valve is normal in structure. Tricuspid valve regurgitation is trivial. Aortic Valve: The aortic valve is tricuspid. Aortic valve regurgitation is not visualized. Aortic valve sclerosis/calcification is present, without any evidence of aortic stenosis. Pulmonic Valve: The pulmonic valve was normal in structure. Pulmonic valve regurgitation is not visualized. Aorta: The aortic root and ascending aorta are structurally normal, with no evidence of dilitation. Venous: The inferior vena cava is dilated in size with less than 50% respiratory variability, suggesting right atrial pressure of 15 mmHg. IAS/Shunts: No atrial level shunt detected by color flow Doppler.  LEFT VENTRICLE PLAX 2D LVIDd:         5.50 cm     Diastology LVIDs:         3.30 cm     LV e' medial:    5.55 cm/s LV PW:         0.90 cm     LV E/e' medial:  9.3 LV IVS:        0.70 cm     LV e' lateral:   11.60 cm/s LVOT diam:     2.10 cm     LV E/e' lateral: 4.5 LV SV:         54 LV SV Index:   26 LVOT Area:     3.46 cm  LV Volumes (MOD) LV vol d, MOD A2C: 97.0 ml LV vol s, MOD A2C: 43.0 ml LV SV MOD A2C:     54.0 ml RIGHT VENTRICLE             IVC RV S prime:     15.70 cm/s  IVC diam: 1.70 cm TAPSE (M-mode): 2.1 cm LEFT ATRIUM           Index LA Vol (A2C): 37.1 ml 18.04 ml/m LA Vol (A4C): 38.2 ml 18.57 ml/m  AORTIC VALVE LVOT Vmax:   85.50 cm/s LVOT Vmean:  53.900 cm/s LVOT VTI:    0.155 m  AORTA Ao Root diam: 3.70 cm Ao Asc diam:  3.60 cm MITRAL VALVE MV Area (PHT): 3.85 cm    SHUNTS MV Decel Time: 197 msec    Systemic  VTI:  0.16 m MV E velocity: 51.80 cm/s  Systemic Diam: 2.10 cm MV A velocity: 69.80 cm/s MV E/A ratio:  0.74 Vina Gull MD Electronically signed by Vina Gull MD Signature Date/Time: 07/26/2023/4:29:12 PM    Final    CT CHEST WO CONTRAST Result Date: 07/26/2023 CLINICAL DATA:  73 year old male with recent fall. Pain. COPD. Respiratory failure. EXAM: CT CHEST WITHOUT CONTRAST TECHNIQUE: Multidetector CT imaging of the chest was performed following the standard protocol without IV  contrast. RADIATION DOSE REDUCTION: This exam was performed according to the departmental dose-optimization program which includes automated exposure control, adjustment of the mA and/or kV according to patient size and/or use of iterative reconstruction technique. COMPARISON:  Chest CT 02/12/2021. FINDINGS: Cardiovascular: Calcified aortic atherosclerosis. Advanced calcified coronary artery atherosclerosis and/or stent. Vascular patency is not evaluated in the absence of IV contrast. Mild cardiomegaly, not significantly changed. No pericardial effusion. Mediastinum/Nodes: Reactive appearing small but numerous mediastinal lymph nodes. Lungs/Pleura: Moderate to severe emphysema. Respiratory motion. Atelectatic changes to the major airways which are patent through the carina and mainstem bronchi. Bilateral lower lobe consolidation, and bilateral lower lobe and right middle lobe partial airway collapse or opacification. Consolidation also in the lateral segment of the right middle lobe. No pleural effusion. Underlying pulmonary vascularity appears normal. Upper Abdomen: Negative visible noncontrast liver, gallbladder, spleen, pancreas, adrenal glands, right kidney, bowel. No pneumoperitoneum or free fluid in the visible upper abdomen. Musculoskeletal: Stable thoracic vertebral height and alignment since 2023. Sternum and left shoulder osseous structures appear intact. Chronic medial right clavicle fracture is stable since 2023. Chronic left  anterior, lateral, posterior left rib fractures. No acute left rib fracture is identified. Chronic right anterior rib fractures. Chronic, partially sclerotic but unhealed right posterolateral 9th rib fracture, was present in 2023. Widely displaced posterolateral right 10th rib fracture on series 4, image 134. That rib was not included previously, but this is favored to be chronic. And there are superimposed chronic fractures of the right 9th, 10th, 11th costovertebral junctions. No acute superficial soft tissue injury identified. IMPRESSION: 1. Advanced Emphysema (ICD10-J43.9) with superimposed Bilateral lower lobe and Right middle lobe Consolidated Pneumonia. No pleural effusion. 2. Numerous chronic bilateral rib fractures. Chronic right clavicle fracture. No definite acute traumatic injury in the noncontrast Chest. 3.  Aortic Atherosclerosis (ICD10-I70.0). Electronically Signed   By: VEAR Hurst M.D.   On: 07/26/2023 06:06   CT Head Wo Contrast Result Date: 07/26/2023 CLINICAL DATA:  Recent fall with headaches and neck pain, initial encounter EXAM: CT HEAD WITHOUT CONTRAST CT CERVICAL SPINE WITHOUT CONTRAST TECHNIQUE: Multidetector CT imaging of the head and cervical spine was performed following the standard protocol without intravenous contrast. Multiplanar CT image reconstructions of the cervical spine were also generated. RADIATION DOSE REDUCTION: This exam was performed according to the departmental dose-optimization program which includes automated exposure control, adjustment of the mA and/or kV according to patient size and/or use of iterative reconstruction technique. COMPARISON:  None Available. FINDINGS: CT HEAD FINDINGS Brain: No evidence of acute infarction, hemorrhage, hydrocephalus, extra-axial collection or mass lesion/mass effect. Mild atrophic changes and chronic white matter ischemic changes are seen. Vascular: No hyperdense vessel or unexpected calcification. Skull: Normal. Negative for fracture  or focal lesion. Sinuses/Orbits: No acute finding. Other: None. CT CERVICAL SPINE FINDINGS Alignment: Within normal limits. Skull base and vertebrae: 7 cervical segments are well visualized. Vertebral body height is well maintained. Mild osteophytic changes are seen. Facet hypertrophic changes are noted as well. No acute fracture or acute facet abnormality is noted. The odontoid is within normal limits. Soft tissues and spinal canal: Findings soft tissue structures are within normal limits. Upper chest: Visualized lung apices show emphysematous changes and biapical scarring. Other: None IMPRESSION: CT of the head: No acute intracranial abnormality noted. Chronic atrophic and ischemic changes. CT of the cervical spine: Multilevel degenerative change without acute abnormality. Electronically Signed   By: Oneil Devonshire M.D.   On: 07/26/2023 02:52   CT Cervical Spine Wo Contrast  Result Date: 07/26/2023 CLINICAL DATA:  Recent fall with headaches and neck pain, initial encounter EXAM: CT HEAD WITHOUT CONTRAST CT CERVICAL SPINE WITHOUT CONTRAST TECHNIQUE: Multidetector CT imaging of the head and cervical spine was performed following the standard protocol without intravenous contrast. Multiplanar CT image reconstructions of the cervical spine were also generated. RADIATION DOSE REDUCTION: This exam was performed according to the departmental dose-optimization program which includes automated exposure control, adjustment of the mA and/or kV according to patient size and/or use of iterative reconstruction technique. COMPARISON:  None Available. FINDINGS: CT HEAD FINDINGS Brain: No evidence of acute infarction, hemorrhage, hydrocephalus, extra-axial collection or mass lesion/mass effect. Mild atrophic changes and chronic white matter ischemic changes are seen. Vascular: No hyperdense vessel or unexpected calcification. Skull: Normal. Negative for fracture or focal lesion. Sinuses/Orbits: No acute finding. Other: None. CT  CERVICAL SPINE FINDINGS Alignment: Within normal limits. Skull base and vertebrae: 7 cervical segments are well visualized. Vertebral body height is well maintained. Mild osteophytic changes are seen. Facet hypertrophic changes are noted as well. No acute fracture or acute facet abnormality is noted. The odontoid is within normal limits. Soft tissues and spinal canal: Findings soft tissue structures are within normal limits. Upper chest: Visualized lung apices show emphysematous changes and biapical scarring. Other: None IMPRESSION: CT of the head: No acute intracranial abnormality noted. Chronic atrophic and ischemic changes. CT of the cervical spine: Multilevel degenerative change without acute abnormality. Electronically Signed   By: Oneil Devonshire M.D.   On: 07/26/2023 02:52   DG Chest Port 1 View Result Date: 07/26/2023 EXAM: 1 VIEW XRAY OF THE CHEST 07/26/2023 01:48:00 AM COMPARISON: 03/07/2021 CLINICAL HISTORY: Shortness of breath. Encounter for shortness of breath. FINDINGS: LUNGS AND PLEURA: Low lung volumes. Increased interstitial markings in the lower lung suspicious for edema. Retrocardiac airspace opacities. Right basilar atelectasis. Small left pleural effusion. No pneumothorax. HEART AND MEDIASTINUM: Accentuated cardiomediastinal silhouette and pulmonary vascularity due to hypoinflation. BONES AND SOFT TISSUES: No acute osseous abnormality. IMPRESSION: 1. Increased interstitial markings in the lower lung suspicious for edema. 2. Small left pleural effusion. 3. Bibasilar atelectasis vs pneumonia. Electronically signed by: Norman Gatlin MD 07/26/2023 01:54 AM EDT RP Workstation: [id number]        Scheduled Meds:  apixaban   5 mg Oral BID   atorvastatin   40 mg Oral Daily   budesonide  (PULMICORT ) nebulizer solution  0.25 mg Nebulization BID   escitalopram   10 mg Oral QHS   furosemide   80 mg Intravenous BID   gabapentin   100 mg Oral BID   insulin  aspart  0-6 Units Subcutaneous TID WC    ipratropium-albuterol   3 mL Nebulization QID   methylPREDNISolone  (SOLU-MEDROL ) injection  80 mg Intravenous Q12H   sodium chloride  flush  3 mL Intravenous Q12H   sodium chloride  HYPERTONIC  4 mL Nebulization BID   Continuous Infusions:  azithromycin  500 mg (07/27/23 0614)   cefTRIAXone  (ROCEPHIN )  IV 1 g (07/27/23 0529)     LOS: 1 day    Time spent: 55 minutes.    Leatrice Chapel, MD  [facility] Pager #: [phone] 7PM-7AM contact night coverage as above

## 2023-07-28 DIAGNOSIS — G9341 Metabolic encephalopathy: Secondary | ICD-10-CM | POA: Diagnosis not present

## 2023-07-28 DIAGNOSIS — J9601 Acute respiratory failure with hypoxia: Secondary | ICD-10-CM | POA: Diagnosis not present

## 2023-07-28 LAB — GLUCOSE, CAPILLARY
Glucose-Capillary: 245 mg/dL — ABNORMAL HIGH (ref 70–99)
Glucose-Capillary: 236 mg/dL — ABNORMAL HIGH (ref 70–99)
Glucose-Capillary: 354 mg/dL — ABNORMAL HIGH (ref 70–99)
Glucose-Capillary: 140 mg/dL — ABNORMAL HIGH (ref 70–99)

## 2023-07-28 MED ORDER — GUAIFENESIN ER 600 MG PO TB12
600.0000 mg | ORAL_TABLET | Freq: Two times a day (BID) | ORAL | Status: AC
  Administered 2023-07-28 – 2023-07-29 (×4): 600 mg via ORAL
  Filled 2023-07-28 (×5): qty 1

## 2023-07-28 MED ORDER — INSULIN ASPART 100 UNIT/ML IJ SOLN
0.0000 [IU] | Freq: Three times a day (TID) | INTRAMUSCULAR | Status: DC
  Administered 2023-07-28: 15 [IU] via SUBCUTANEOUS
  Administered 2023-07-29: 11 [IU] via SUBCUTANEOUS
  Administered 2023-07-29 (×2): 5 [IU] via SUBCUTANEOUS
  Administered 2023-07-30: 8 [IU] via SUBCUTANEOUS
  Administered 2023-07-30: 5 [IU] via SUBCUTANEOUS
  Administered 2023-07-31: 15 [IU] via SUBCUTANEOUS
  Administered 2023-07-31: 11 [IU] via SUBCUTANEOUS
  Administered 2023-07-31: 8 [IU] via SUBCUTANEOUS
  Administered 2023-08-01: 15 [IU] via SUBCUTANEOUS
  Administered 2023-08-01 – 2023-08-02 (×4): 8 [IU] via SUBCUTANEOUS
  Administered 2023-08-02: 11 [IU] via SUBCUTANEOUS
  Administered 2023-08-03 (×2): 8 [IU] via SUBCUTANEOUS
  Administered 2023-08-03: 15 [IU] via SUBCUTANEOUS
  Administered 2023-08-04: 8 [IU] via SUBCUTANEOUS
  Administered 2023-08-04 – 2023-08-05 (×4): 5 [IU] via SUBCUTANEOUS
  Administered 2023-08-05: 11 [IU] via SUBCUTANEOUS
  Administered 2023-08-06 (×2): 3 [IU] via SUBCUTANEOUS
  Administered 2023-08-06 – 2023-08-07 (×2): 2 [IU] via SUBCUTANEOUS
  Administered 2023-08-07 (×2): 3 [IU] via SUBCUTANEOUS
  Administered 2023-08-08: 11 [IU] via SUBCUTANEOUS
  Administered 2023-08-08: 2 [IU] via SUBCUTANEOUS
  Administered 2023-08-09: 3 [IU] via SUBCUTANEOUS
  Administered 2023-08-09: 2 [IU] via SUBCUTANEOUS
  Administered 2023-08-09: 11 [IU] via SUBCUTANEOUS
  Filled 2023-07-28: qty 10

## 2023-07-28 MED ORDER — QUETIAPINE FUMARATE 25 MG PO TABS
12.5000 mg | ORAL_TABLET | Freq: Every day | ORAL | Status: DC
  Administered 2023-07-28 – 2023-08-14 (×18): 12.5 mg via ORAL
  Filled 2023-07-28 (×19): qty 1

## 2023-07-28 MED ORDER — LEVALBUTEROL HCL 0.63 MG/3ML IN NEBU
0.6300 mg | INHALATION_SOLUTION | Freq: Three times a day (TID) | RESPIRATORY_TRACT | Status: DC
  Administered 2023-07-29 (×2): 0.63 mg via RESPIRATORY_TRACT
  Filled 2023-07-28 (×2): qty 3

## 2023-07-28 MED ORDER — EYE WASH OP SOLN
1.0000 [drp] | OPHTHALMIC | Status: DC | PRN
  Filled 2023-07-28: qty 118

## 2023-07-28 MED ORDER — LEVALBUTEROL HCL 0.63 MG/3ML IN NEBU
0.6300 mg | INHALATION_SOLUTION | Freq: Three times a day (TID) | RESPIRATORY_TRACT | Status: DC
  Administered 2023-07-28: 0.63 mg via RESPIRATORY_TRACT
  Filled 2023-07-28: qty 3

## 2023-07-28 MED ORDER — FERROUS SULFATE 325 (65 FE) MG PO TABS
325.0000 mg | ORAL_TABLET | Freq: Every day | ORAL | Status: DC
  Administered 2023-07-28 – 2023-08-15 (×19): 325 mg via ORAL
  Filled 2023-07-28 (×21): qty 1

## 2023-07-28 MED ORDER — IPRATROPIUM BROMIDE 0.02 % IN SOLN
0.5000 mg | Freq: Three times a day (TID) | RESPIRATORY_TRACT | Status: DC
  Administered 2023-07-29 (×2): 0.5 mg via RESPIRATORY_TRACT
  Filled 2023-07-28 (×2): qty 2.5

## 2023-07-28 MED ORDER — POLYVINYL ALCOHOL 1.4 % OP SOLN
1.0000 [drp] | OPHTHALMIC | Status: DC | PRN
  Administered 2023-07-29 – 2023-07-31 (×3): 1 [drp] via OPHTHALMIC
  Filled 2023-07-28: qty 15

## 2023-07-28 MED ORDER — ENSURE PLUS HIGH PROTEIN PO LIQD
237.0000 mL | Freq: Two times a day (BID) | ORAL | Status: DC
  Administered 2023-07-28: 237 mL via ORAL

## 2023-07-28 MED ORDER — METOPROLOL TARTRATE 5 MG/5ML IV SOLN
5.0000 mg | Freq: Once | INTRAVENOUS | Status: AC
  Administered 2023-07-28: 5 mg via INTRAVENOUS
  Filled 2023-07-28: qty 5

## 2023-07-28 MED ORDER — GLUCERNA SHAKE PO LIQD
237.0000 mL | Freq: Three times a day (TID) | ORAL | Status: DC
  Administered 2023-07-28 – 2023-08-15 (×46): 237 mL via ORAL

## 2023-07-28 NOTE — Progress Notes (Signed)
 PROGRESS NOTE    Anthony [name].  FMW:[id number] DOB: [birth year] DOA: 07/26/2023 PCP: No primary care provider on file.  Outpatient Specialists:     Brief Narrative:  Patient is a 73 year old male, morbidly obese, with past medical history significant for interstitial lung disease, COPD, COVID infection, coronary artery disease, diastolic heart failure, paroxysmal atrial fibrillation, DVT, peripheral artery disease s/p left above-knee amputation, diabetes mellitus type 2 and hyperlipidemia.  Patient is a skilled nursing facility resident.  Patient was admitted with altered mentation, acute on chronic respiratory failure, COPD with exacerbation, and multilobar pneumonia.  07/26/2023: Patient was admitted earlier today.  Patient seems to be more responsive.  Discussed with patient's daughter, Cena Potters, who happens to be patient's power of attorney.  Ms. Potters has confirmed that patient is DO NOT RESUSCITATE.  Will change to CODE STATUS.  Patient remains critically ill.  07/27/2023: Patient seen alongside patient's nurse.  Patient looks a lot better today.  Patient is more communicative.  Patient is reported to have demented illness (as per patient's daughter).  Patient is able to give details.  Patient remains on 100% of supplemental oxygen.  Will wean down supplemental oxygen as tolerated.  07/28/2023: Patient seen.  Patient is reported to have intermittent A-fib with RVR (up to 140s as reported by patient's nurse).  Continue Lopressor  IV 5 Mg as needed.  Decrease xopenex  nebulizer 0.63 Mg from every 6 hourly to every 8 hourly.  Nurse also reports difficulty managing patient.  History of dementia as noted.  Assessment & Plan:   Principal Problem:   Acute exacerbation of CHF (congestive heart failure) (HCC) Active Problems:   Acute respiratory failure with hypoxemia (HCC)   Acute on chronic hypoxic respiratory failure: COPD with exacerbation/ILD: - Continue Xopenex  0.63 mg, by change  frequency from Q6 hourly to every 8 hourly.  Continue Xopenex  every 6 hours as needed. -IV Solu-Medrol  80 mg twice daily.     -Complete course of nebs hypertonic saline. - Deep suctioning. - Incentive spirometry and flutter valve device therapy as tolerated.   -Continue supplemental oxygen. - Patient is DNR/DNI.  Possible CHF with exacerbation: - IV Lasix  has been discontinued -I/O = 1453/2350, with negative fluid balance of 2155. - Monitor closely and administer diuretics as needed.  - Patient is currently compensated. - See echo result.  Multilobar pneumonia: - Follow final cultures. - Continue IV ceftriaxone .  - Nasal swab for MRSA came back negative.  Respiratory viral panel came back negative.  Acute encephalopathy, metabolic, cannot rule out toxic: - Resolved significantly. - Likely multifactorial.  History of PAD: - Status post left AKA.     DVT prophylaxis: Continue apixaban . Code Status: DO NOT RESUSCITATE/DO NOT INTUBATE Family Communication: Patient's daughter, Cena Mayor. Disposition Plan: Patient remains inpatient.   Consultants:  None.  Procedures:  Echo revealed: 1. Difficult acoustic windows Overall LV function is normal to mildly  decreased Would recomm limited imaging with Definity to evaluate regional  wall motion and overall LV function. . The left ventricle has no regional  wall motion abnormalities. Left  ventricular diastolic parameters were normal.   2. Right ventricular systolic function reduced. The right ventricular  size is normal.   3. The mitral valve is normal in structure. Trivial mitral valve  regurgitation.   4. The aortic valve is tricuspid. Aortic valve regurgitation is not  visualized. Aortic valve sclerosis/calcification is present, without any  evidence of aortic stenosis.   5. The inferior vena cava is dilated  in size with <50% respiratory  variability, suggesting right atrial pressure of 15 mmHg.   Antimicrobials:  IV  azithromycin  discontinued. IV Rocephin .   Subjective: - Some management difficulties reported - More communicative.  Objective: Vitals:   07/28/23 0343 07/28/23 0630 07/28/23 0732 07/28/23 1111  BP:   (!) 140/73 124/63  Pulse:  60 83   Resp: 19 20 20 20   Temp:   98.7 F (37.1 C) 98.7 F (37.1 C)  TempSrc:   Oral Oral  SpO2:  94% 94% 92%  Weight:        Intake/Output Summary (Last 24 hours) at 07/28/2023 1130 Last data filed at 07/28/2023 1100 Gross per 24 hour  Intake 1203.9 ml  Output 1750 ml  Net -546.1 ml   Filed Weights   07/27/23 0343 07/27/23 2349 07/28/23 0252  Weight: 80.2 kg 81 kg 81 kg    Examination:  General exam: Patient is obese.  Patient is awake and alert.  Patient is more communicative.  Respiratory system: Significantly improved air entry.   Cardiovascular system: S1 & S2  Gastrointestinal system: Abdomen is obese, soft and nontender.   Central nervous system: Awake and alert.  Extremities: Status post left AKA.  Data Reviewed: I have personally reviewed following labs and imaging studies  CBC: Recent Labs  Lab 07/26/23 0201 07/26/23 0210 07/27/23 0721  WBC  --  21.8* 15.6*  NEUTROABS  --  19.0* 14.6*  HGB 11.2* 9.3* 9.9*  HCT 33.0* 34.5* 35.3*  MCV  --  72.9* 70.9*  PLT  --  299 377   Basic Metabolic Panel: Recent Labs  Lab 07/26/23 0201 07/26/23 0210 07/27/23 0721  NA 136 134* 139  139  K 4.8 4.8 4.4  4.4  CL  --  102 97*  98  CO2  --  21* 24  24  GLUCOSE  --  175* 172*  173*  BUN  --  24* 32*  31*  CREATININE  --  2.21* 1.46*  1.45*  CALCIUM   --  9.0 9.0  9.1  MG  --   --  2.0  PHOS  --   --  4.0  4.1   GFR: CrCl cannot be calculated (Unknown ideal weight.). Liver Function Tests: Recent Labs  Lab 07/26/23 0210 07/27/23 0721  AST 23  --   ALT 14  --   ALKPHOS 57  --   BILITOT 0.9  --   PROT 7.2  --   ALBUMIN  3.3* 3.2*   No results for input(s): LIPASE, AMYLASE in the last 168 hours. No results for  input(s): AMMONIA in the last 168 hours. Coagulation Profile: No results for input(s): INR, PROTIME in the last 168 hours. Cardiac Enzymes: No results for input(s): CKTOTAL, CKMB, CKMBINDEX, TROPONINI in the last 168 hours. BNP (last 3 results) No results for input(s): PROBNP in the last 8760 hours. HbA1C: Recent Labs    07/27/23 0721  HGBA1C 6.5*   CBG: Recent Labs  Lab 07/27/23 1137 07/27/23 1636 07/27/23 2152 07/28/23 0609 07/28/23 1109  GLUCAP 247* 279* 163* 245* 236*   Lipid Profile: Recent Labs    07/27/23 0721  CHOL 97  HDL 44  LDLCALC 40  TRIG 67  CHOLHDL 2.2   Thyroid  Function Tests: Recent Labs    07/27/23 0721  TSH 0.770   Anemia Panel: Recent Labs    07/27/23 0721  VITAMINB12 187  FOLATE 6.6  FERRITIN 39  TIBC 372  IRON 8*   Urine analysis:  Component Value Date/Time   COLORURINE YELLOW 07/26/2023 0621   APPEARANCEUR HAZY (A) 07/26/2023 0621   LABSPEC 1.014 07/26/2023 0621   PHURINE 5.0 07/26/2023 0621   GLUCOSEU 50 (A) 07/26/2023 0621   HGBUR SMALL (A) 07/26/2023 0621   BILIRUBINUR NEGATIVE 07/26/2023 0621   KETONESUR NEGATIVE 07/26/2023 0621   PROTEINUR 30 (A) 07/26/2023 0621   UROBILINOGEN 0.2 05/05/2013 0633   NITRITE NEGATIVE 07/26/2023 0621   LEUKOCYTESUR NEGATIVE 07/26/2023 0621   Sepsis Labs: @LABRCNTIP (procalcitonin:4,lacticidven:4)  ) Recent Results (from the past 240 hours)  Resp panel by RT-PCR (RSV, Flu A&B, Covid) Anterior Nasal Swab     Status: None   Collection Time: 07/26/23  5:32 AM   Specimen: Anterior Nasal Swab  Result Value Ref Range Status   SARS Coronavirus 2 by RT PCR NEGATIVE NEGATIVE Final   Influenza A by PCR NEGATIVE NEGATIVE Final   Influenza B by PCR NEGATIVE NEGATIVE Final    Comment: (NOTE) The Xpert Xpress SARS-CoV-2/FLU/RSV plus assay is intended as an aid in the diagnosis of influenza from Nasopharyngeal swab specimens and should not be used as a sole basis for treatment.  Nasal washings and aspirates are unacceptable for Xpert Xpress SARS-CoV-2/FLU/RSV testing.  Fact Sheet for Patients: BloggerCourse.com  Fact Sheet for Healthcare Providers: SeriousBroker.it  This test is not yet approved or cleared by the [place]  FDA and has been authorized for detection and/or diagnosis of SARS-CoV-2 by FDA under an Emergency Use Authorization (EUA). This EUA will remain in effect (meaning this test can be used) for the duration of the COVID-19 declaration under Section 564(b)(1) of the Act, 21 U.S.C. section 360bbb-3(b)(1), unless the authorization is terminated or revoked.     Resp Syncytial Virus by PCR NEGATIVE NEGATIVE Final    Comment: (NOTE) Fact Sheet for Patients: BloggerCourse.com  Fact Sheet for Healthcare Providers: SeriousBroker.it  This test is not yet approved or cleared by the [place]  FDA and has been authorized for detection and/or diagnosis of SARS-CoV-2 by FDA under an Emergency Use Authorization (EUA). This EUA will remain in effect (meaning this test can be used) for the duration of the COVID-19 declaration under Section 564(b)(1) of the Act, 21 U.S.C. section 360bbb-3(b)(1), unless the authorization is terminated or revoked.  Performed at [facility], [address], [place], [place] [address]   Culture, blood (Routine X 2) w Reflex to ID Panel     Status: None (Preliminary result)   Collection Time: 07/26/23  5:24 PM   Specimen: BLOOD LEFT HAND  Result Value Ref Range Status   Specimen Description BLOOD LEFT HAND  Final   Special Requests   Final    BOTTLES DRAWN AEROBIC AND ANAEROBIC Blood Culture results may not be optimal due to an inadequate volume of blood received in culture bottles   Culture   Final    NO GROWTH 2 DAYS Performed at [facility], [address], [place], [place] [address]    Report  Status PENDING  Incomplete  Culture, blood (Routine X 2) w Reflex to ID Panel     Status: None (Preliminary result)   Collection Time: 07/26/23  5:26 PM   Specimen: BLOOD RIGHT HAND  Result Value Ref Range Status   Specimen Description BLOOD RIGHT HAND  Final   Special Requests   Final    BOTTLES DRAWN AEROBIC AND ANAEROBIC Blood Culture results may not be optimal due to an inadequate volume of blood received in culture bottles  Culture   Final    NO GROWTH 2 DAYS Performed at [facility], [address] [place], [place] [address]    Report Status PENDING  Incomplete  Respiratory (~20 pathogens) panel by PCR     Status: None   Collection Time: 07/26/23  5:55 PM   Specimen: Nasopharyngeal Swab; Respiratory  Result Value Ref Range Status   Adenovirus NOT DETECTED NOT DETECTED Final   Coronavirus 229E NOT DETECTED NOT DETECTED Final    Comment: (NOTE) The Coronavirus on the Respiratory Panel, DOES NOT test for the novel  Coronavirus (2019 nCoV)    Coronavirus HKU1 NOT DETECTED NOT DETECTED Final   Coronavirus NL63 NOT DETECTED NOT DETECTED Final   Coronavirus OC43 NOT DETECTED NOT DETECTED Final   Metapneumovirus NOT DETECTED NOT DETECTED Final   Rhinovirus / Enterovirus NOT DETECTED NOT DETECTED Final   Influenza A NOT DETECTED NOT DETECTED Final   Influenza B NOT DETECTED NOT DETECTED Final   Parainfluenza Virus 1 NOT DETECTED NOT DETECTED Final   Parainfluenza Virus 2 NOT DETECTED NOT DETECTED Final   Parainfluenza Virus 3 NOT DETECTED NOT DETECTED Final   Parainfluenza Virus 4 NOT DETECTED NOT DETECTED Final   Respiratory Syncytial Virus NOT DETECTED NOT DETECTED Final   Bordetella pertussis NOT DETECTED NOT DETECTED Final   Bordetella Parapertussis NOT DETECTED NOT DETECTED Final   Chlamydophila pneumoniae NOT DETECTED NOT DETECTED Final   Mycoplasma pneumoniae NOT DETECTED NOT DETECTED Final    Comment: Performed at [facility], [address],  [place], [place] [address]  MRSA Next Gen by PCR, Nasal     Status: None   Collection Time: 07/27/23 12:40 AM   Specimen: Nasal Mucosa; Nasal Swab  Result Value Ref Range Status   MRSA by PCR Next Gen NOT DETECTED NOT DETECTED Final    Comment: (NOTE) The GeneXpert MRSA Assay (FDA approved for NASAL specimens only), is one component of a comprehensive MRSA colonization surveillance program. It is not intended to diagnose MRSA infection nor to guide or monitor treatment for MRSA infections. Test performance is not FDA approved in patients less than 2 years old. Performed at [facility], [address], [place], [place] [address]          Radiology Studies: DG CHEST PORT 1 VIEW Result Date: 07/27/2023 CLINICAL DATA:  Shortness of breath. EXAM: PORTABLE CHEST 1 VIEW COMPARISON:  Radiograph and CT yesterday FINDINGS: Lung volumes are low. Stable heart size and mediastinal contours. Again seen bibasilar and peripheral lower lung zone opacities. No pneumothorax. No large pleural effusion by radiograph. IMPRESSION: Low lung volumes with bibasilar and peripheral lower lung zone opacities, unchanged from yesterday. Electronically Signed   By: Andrea Gasman M.D.   On: 07/27/2023 18:04   ECHOCARDIOGRAM COMPLETE Result Date: 07/26/2023    ECHOCARDIOGRAM REPORT   Patient Name:   Kanaan Kagawa. Date of Exam: 07/26/2023 Medical Rec #:  [id number]          Height:       68.0 in Accession #:    [id number]         Weight:       203.0 lb Date of Birth:  1950-03-13          BSA:          2.057 m Patient Age:    72 years           BP:  108/71 mmHg Patient Gender: M                  HR:           76 bpm. Exam Location:  Inpatient Procedure: 2D Echo (Both Spectral and Color Flow Doppler were utilized during            procedure). Indications:    congestive heart failure  History:        Patient has no prior history of Echocardiogram examinations,                 most recent 02/21/2021. CAD, COPD;  Risk Factors:Hypertension,                 Dyslipidemia and Diabetes.  Sonographer:    Tinnie Barefoot RDCS Referring Phys: [id number] DORN DAWSON  Sonographer Comments: Image acquisition challenging due to respiratory motion and Image acquisition challenging due to COPD. IMPRESSIONS  1. Difficult acoustic windows Overall LV function is normal to mildly decreased Would recomm limited imaging with Definity to evaluate regional wall motion and overall LV function. . The left ventricle has no regional wall motion abnormalities. Left ventricular diastolic parameters were normal.  2. Right ventricular systolic function reduced. The right ventricular size is normal.  3. The mitral valve is normal in structure. Trivial mitral valve regurgitation.  4. The aortic valve is tricuspid. Aortic valve regurgitation is not visualized. Aortic valve sclerosis/calcification is present, without any evidence of aortic stenosis.  5. The inferior vena cava is dilated in size with <50% respiratory variability, suggesting right atrial pressure of 15 mmHg. FINDINGS  Left Ventricle: Difficult acoustic windows Overall LV function is normal to mildly decreased Would recomm limited imaging with Definity to evaluate regional wall motion and overall LV function. The left ventricle has no regional wall motion abnormalities. The left ventricular internal cavity size was normal in size. There is no left ventricular hypertrophy. Left ventricular diastolic parameters were normal. Right Ventricle: The right ventricular size is normal. Right vetricular wall thickness was not assessed. Right ventricular systolic function reduced. Left Atrium: Left atrial size was normal in size. Right Atrium: Right atrial size was normal in size. Pericardium: There is no evidence of pericardial effusion. Mitral Valve: The mitral valve is normal in structure. Trivial mitral valve regurgitation. Tricuspid Valve: The tricuspid valve is normal in structure. Tricuspid valve  regurgitation is trivial. Aortic Valve: The aortic valve is tricuspid. Aortic valve regurgitation is not visualized. Aortic valve sclerosis/calcification is present, without any evidence of aortic stenosis. Pulmonic Valve: The pulmonic valve was normal in structure. Pulmonic valve regurgitation is not visualized. Aorta: The aortic root and ascending aorta are structurally normal, with no evidence of dilitation. Venous: The inferior vena cava is dilated in size with less than 50% respiratory variability, suggesting right atrial pressure of 15 mmHg. IAS/Shunts: No atrial level shunt detected by color flow Doppler.  LEFT VENTRICLE PLAX 2D LVIDd:         5.50 cm     Diastology LVIDs:         3.30 cm     LV e' medial:    5.55 cm/s LV PW:         0.90 cm     LV E/e' medial:  9.3 LV IVS:        0.70 cm     LV e' lateral:   11.60 cm/s LVOT diam:     2.10 cm     LV E/e' lateral: 4.5  LV SV:         54 LV SV Index:   26 LVOT Area:     3.46 cm  LV Volumes (MOD) LV vol d, MOD A2C: 97.0 ml LV vol s, MOD A2C: 43.0 ml LV SV MOD A2C:     54.0 ml RIGHT VENTRICLE             IVC RV S prime:     15.70 cm/s  IVC diam: 1.70 cm TAPSE (M-mode): 2.1 cm LEFT ATRIUM           Index LA Vol (A2C): 37.1 ml 18.04 ml/m LA Vol (A4C): 38.2 ml 18.57 ml/m  AORTIC VALVE LVOT Vmax:   85.50 cm/s LVOT Vmean:  53.900 cm/s LVOT VTI:    0.155 m  AORTA Ao Root diam: 3.70 cm Ao Asc diam:  3.60 cm MITRAL VALVE MV Area (PHT): 3.85 cm    SHUNTS MV Decel Time: 197 msec    Systemic VTI:  0.16 m MV E velocity: 51.80 cm/s  Systemic Diam: 2.10 cm MV A velocity: 69.80 cm/s MV E/A ratio:  0.74 Vina Gull MD Electronically signed by Vina Gull MD Signature Date/Time: 07/26/2023/4:29:12 PM    Final         Scheduled Meds:  apixaban   5 mg Oral BID   atorvastatin   40 mg Oral Daily   budesonide  (PULMICORT ) nebulizer solution  0.25 mg Nebulization BID   escitalopram   10 mg Oral QHS   ferrous sulfate   325 mg Oral Q breakfast   gabapentin   100 mg Oral BID    insulin  aspart  0-6 Units Subcutaneous TID WC   ipratropium  0.5 mg Nebulization Q6H   levalbuterol   0.63 mg Nebulization Q6H   methylPREDNISolone  (SOLU-MEDROL ) injection  80 mg Intravenous Q12H   sodium chloride  flush  3 mL Intravenous Q12H   sodium chloride  HYPERTONIC  4 mL Nebulization BID   Continuous Infusions:  cefTRIAXone  (ROCEPHIN )  IV 1 g (07/28/23 0458)     LOS: 2 days    Time spent: 55 minutes.    Leatrice Chapel, MD  [facility] Pager #: [phone] 7PM-7AM contact night coverage as above

## 2023-07-28 NOTE — Plan of Care (Signed)
   Problem: Nutritional: Goal: Maintenance of adequate nutrition will improve Outcome: Progressing   Problem: Skin Integrity: Goal: Risk for impaired skin integrity will decrease Outcome: Progressing

## 2023-07-28 NOTE — Progress Notes (Signed)
 RT attempted to NT suction patient down both nares but met resistance and unable to pass catheter. Patient would not allow RT to attempt a 3rd time.  RN notified

## 2023-07-28 NOTE — Progress Notes (Signed)
 Pt called out saying he felt SOB. Upon assessment pt had taken off HHNC and O2 stats were in the 70's. Once O2 was back on pt stats came to low 80's. Pt was placed on 100% O2 and stats still remanded in the low 80's. Respiratory was called and a non-rebreather was placed on top of the HHNC. O2 stats in the low 90's. Will continue to monitor.

## 2023-07-28 NOTE — Progress Notes (Addendum)
 Plan of care is reviewed. Pt is, afebrile, sinus rhythm with frequent PACs on the monitor, MD aware. BP stable, on heated high flow NCL 55 LPM, FiO2 92%, SPO2 88-94%. RR 20-28, productive cough and congested with minimal tan and white sputum. Breathing treatments are provided by RT.  IV antibiotics Zithromax  and Rocephin  q 24 hrs. Breathing exercise with incentive spirometer and flutter valve are encouraged.   Pt is confused and forgetful, oriented x 1 to self, but redirectable. He frequently pulls his NCL out and SPO2 drops to 75-79% on room air. We have been closely monitoring him overnight.   Doy Dash, RN

## 2023-07-29 ENCOUNTER — Inpatient Hospital Stay (HOSPITAL_COMMUNITY)

## 2023-07-29 DIAGNOSIS — J9601 Acute respiratory failure with hypoxia: Secondary | ICD-10-CM | POA: Diagnosis not present

## 2023-07-29 DIAGNOSIS — J441 Chronic obstructive pulmonary disease with (acute) exacerbation: Secondary | ICD-10-CM | POA: Diagnosis not present

## 2023-07-29 DIAGNOSIS — J9621 Acute and chronic respiratory failure with hypoxia: Secondary | ICD-10-CM

## 2023-07-29 DIAGNOSIS — J189 Pneumonia, unspecified organism: Secondary | ICD-10-CM | POA: Insufficient documentation

## 2023-07-29 LAB — CBC WITH DIFFERENTIAL/PLATELET
Abs Immature Granulocytes: 0.15 K/uL — ABNORMAL HIGH (ref 0.00–0.07)
Basophils Absolute: 0 K/uL (ref 0.0–0.1)
Basophils Relative: 0 %
Eosinophils Absolute: 0 K/uL (ref 0.0–0.5)
Eosinophils Relative: 0 %
HCT: 34.7 % — ABNORMAL LOW (ref 39.0–52.0)
Hemoglobin: 9.6 g/dL — ABNORMAL LOW (ref 13.0–17.0)
Immature Granulocytes: 1 %
Lymphocytes Relative: 3 %
Lymphs Abs: 0.4 K/uL — ABNORMAL LOW (ref 0.7–4.0)
MCH: 19.4 pg — ABNORMAL LOW (ref 26.0–34.0)
MCHC: 27.7 g/dL — ABNORMAL LOW (ref 30.0–36.0)
MCV: 70.2 fL — ABNORMAL LOW (ref 80.0–100.0)
Monocytes Absolute: 0.6 K/uL (ref 0.1–1.0)
Monocytes Relative: 3 %
Neutro Abs: 16.3 K/uL — ABNORMAL HIGH (ref 1.7–7.7)
Neutrophils Relative %: 93 %
Platelets: 366 K/uL (ref 150–400)
RBC: 4.94 MIL/uL (ref 4.22–5.81)
RDW: 19.5 % — ABNORMAL HIGH (ref 11.5–15.5)
WBC: 17.5 K/uL — ABNORMAL HIGH (ref 4.0–10.5)
nRBC: 0.2 % (ref 0.0–0.2)

## 2023-07-29 LAB — RENAL FUNCTION PANEL
Albumin: 3.2 g/dL — ABNORMAL LOW (ref 3.5–5.0)
Anion gap: 14 (ref 5–15)
BUN: 48 mg/dL — ABNORMAL HIGH (ref 8–23)
CO2: 22 mmol/L (ref 22–32)
Calcium: 9.3 mg/dL (ref 8.9–10.3)
Chloride: 101 mmol/L (ref 98–111)
Creatinine, Ser: 1.21 mg/dL (ref 0.61–1.24)
GFR, Estimated: >60 mL/min (ref >60)
Glucose, Bld: 249 mg/dL — ABNORMAL HIGH (ref 70–99)
Phosphorus: 3.6 mg/dL (ref 2.5–4.6)
Potassium: 4.5 mmol/L (ref 3.5–5.1)
Sodium: 137 mmol/L (ref 135–145)

## 2023-07-29 LAB — BLOOD GAS, ARTERIAL
Acid-Base Excess: 1.2 mmol/L (ref 0.0–2.0)
Bicarbonate: 26 mmol/L (ref 20.0–28.0)
O2 Saturation: 92.3 %
Patient temperature: 36.8
pCO2 arterial: 41 mmHg (ref 32–48)
pH, Arterial: 7.41 (ref 7.35–7.45)
pO2, Arterial: 64 mmHg — ABNORMAL LOW (ref 83–108)

## 2023-07-29 LAB — BRAIN NATRIURETIC PEPTIDE: B Natriuretic Peptide: 245.2 pg/mL — ABNORMAL HIGH (ref 0.0–100.0)

## 2023-07-29 LAB — GLUCOSE, CAPILLARY
Glucose-Capillary: 244 mg/dL — ABNORMAL HIGH (ref 70–99)
Glucose-Capillary: 205 mg/dL — ABNORMAL HIGH (ref 70–99)
Glucose-Capillary: 303 mg/dL — ABNORMAL HIGH (ref 70–99)
Glucose-Capillary: 222 mg/dL — ABNORMAL HIGH (ref 70–99)

## 2023-07-29 LAB — STREP PNEUMONIAE URINARY ANTIGEN: Strep Pneumo Urinary Antigen: NEGATIVE

## 2023-07-29 MED ORDER — FUROSEMIDE 10 MG/ML IJ SOLN: 40.0000 mg | Freq: Two times a day (BID) | INTRAMUSCULAR | Status: DC

## 2023-07-29 MED ORDER — IPRATROPIUM-ALBUTEROL 0.5-2.5 (3) MG/3ML IN SOLN: 3.0000 mL | RESPIRATORY_TRACT | Status: DC | PRN

## 2023-07-29 MED ORDER — METHYLPREDNISOLONE SODIUM SUCC 125 MG IJ SOLR
60.0000 mg | Freq: Two times a day (BID) | INTRAMUSCULAR | Status: DC
  Administered 2023-07-29 – 2023-07-30 (×3): 60 mg via INTRAVENOUS
  Filled 2023-07-29 (×3): qty 2

## 2023-07-29 MED ORDER — SODIUM CHLORIDE 0.9 % IV SOLN
500.0000 mg | INTRAVENOUS | Status: DC
  Administered 2023-07-29: 500 mg via INTRAVENOUS
  Filled 2023-07-29: qty 5

## 2023-07-29 MED ORDER — FUROSEMIDE 10 MG/ML IJ SOLN
80.0000 mg | Freq: Two times a day (BID) | INTRAMUSCULAR | Status: DC
  Administered 2023-07-29: 80 mg via INTRAVENOUS
  Filled 2023-07-29: qty 8

## 2023-07-29 MED ORDER — REVEFENACIN 175 MCG/3ML IN SOLN
175.0000 ug | Freq: Every day | RESPIRATORY_TRACT | Status: DC
  Administered 2023-07-30 – 2023-08-15 (×17): 175 ug via RESPIRATORY_TRACT
  Filled 2023-07-29 (×17): qty 3

## 2023-07-29 MED ORDER — ARFORMOTEROL TARTRATE 15 MCG/2ML IN NEBU
15.0000 ug | INHALATION_SOLUTION | Freq: Two times a day (BID) | RESPIRATORY_TRACT | Status: DC
  Administered 2023-07-29 – 2023-08-12 (×27): 15 ug via RESPIRATORY_TRACT
  Filled 2023-07-29 (×27): qty 2

## 2023-07-29 MED ORDER — LEVALBUTEROL HCL 0.63 MG/3ML IN NEBU: 0.6300 mg | INHALATION_SOLUTION | RESPIRATORY_TRACT | Status: DC | PRN

## 2023-07-29 MED ORDER — BUDESONIDE 0.5 MG/2ML IN SUSP
0.5000 mg | Freq: Two times a day (BID) | RESPIRATORY_TRACT | Status: DC
  Administered 2023-07-29 – 2023-08-12 (×28): 0.5 mg via RESPIRATORY_TRACT
  Filled 2023-07-29 (×27): qty 2

## 2023-07-29 MED ORDER — FUROSEMIDE 10 MG/ML IJ SOLN
40.0000 mg | Freq: Two times a day (BID) | INTRAMUSCULAR | Status: AC
  Administered 2023-07-29 – 2023-07-30 (×2): 40 mg via INTRAVENOUS
  Filled 2023-07-29 (×2): qty 4

## 2023-07-29 MED ORDER — INSULIN GLARGINE-YFGN 100 UNIT/ML ~~LOC~~ SOLN
10.0000 [IU] | Freq: Every day | SUBCUTANEOUS | Status: DC
  Administered 2023-07-29 – 2023-07-30 (×2): 10 [IU] via SUBCUTANEOUS
  Filled 2023-07-29 (×2): qty 0.1

## 2023-07-29 NOTE — Care Management Important Message (Signed)
 Important Message  Patient Details  Name: Anthony [name]. MRN: [id number] Date of Birth: [birth year]   Important Message Given:  Yes - Medicare IM     Claretta Deed 07/29/2023, 3:38 PM

## 2023-07-29 NOTE — Evaluation (Signed)
 Occupational Therapy Evaluation Patient Details Name: Anthony [name]. MRN: [id number] DOB: [birth year] Today's Date: 07/29/2023   History of Present Illness   73 y.o. male presents to [facility] 07/26/23 from [place] nursing home after sliding off the bed with AMS and hypoxia. Admitted with acute hypoxic respiratory failure, PNA, and acute encephalopathy. PMHx: Dementia, ILD, COPD, CAD with hx inferior STEMI, RCA PCI, diastolic HF, paroxysmal A-fib, DVT, on anticoagulation, PAD with L AKA. diabetes type 2, hyperlipidemia.     Clinical Impressions Pt presents with decline in function and safety with ADLs and ADL mobility with impaired strength, balance, endurance and safety awareness; pt with hc of cognitive impairments and is a LTC resident of a SNF. PTA pt reports that he performed his own ADLs and used as w/c, could SPT; uncertain of accuracy of info pt provided. Pt currently requires mod - min A with bed mobility, pt ate a few bites of food from tray while seated EOB, total A with LB ADLs, min A with UB ADLs and total A with toileting; mod A +2 sit - stand form EOB to RW, pt's HR increasing to 160, transfer to chair deferred. Pt with significant coughing during session (RN notified). Pt would benefit from acute OT services to address impairments to maximize level of function and safety     If plan is discharge home, recommend the following:   A lot of help with bathing/dressing/bathroom;A lot of help with walking and/or transfers;Assistance with cooking/housework;Direct supervision/assist for financial management;Supervision due to cognitive status;Assist for transportation;Help with stairs or ramp for entrance;Direct supervision/assist for medications management     Functional Status Assessment         Equipment Recommendations   Other (comment) (defer)     Recommendations for Other Services         Precautions/Restrictions   Precautions Precautions: Fall Recall of  Precautions/Restrictions: Impaired Precaution/Restrictions Comments: watch O2; impulsive Restrictions Weight Bearing Restrictions Per Provider Order: No     Mobility Bed Mobility Overal bed mobility: Needs Assistance Bed Mobility: Rolling, Sidelying to Sit, Sit to Sidelying Rolling: Contact guard assist Sidelying to sit: Mod assist     Sit to sidelying: Min assist General bed mobility comments: ModA to raise trunk from sidelying, MinA for return to supine for slight LE management. Performed x5 lateral scoots towards HOB using RLE and BUE.    Transfers Overall transfer level: Needs assistance Equipment used: Rolling walker (2 wheels) Transfers: Sit to/from Stand             General transfer comment: sit - stand from EOB to RW mod +2 with cues for hand placement, safety and sequencing. Pt's HR increasing to 160, transfer to chair deferred      Balance Overall balance assessment: Needs assistance, Mild deficits observed, not formally tested, History of Falls Sitting-balance support: Bilateral upper extremity supported, Feet supported Sitting balance-Leahy Scale: Fair     Standing balance support: Bilateral upper extremity supported, Reliant on assistive device for balance Standing balance-Leahy Scale: Poor                             ADL either performed or assessed with clinical judgement   ADL Overall ADL's : Needs assistance/impaired Eating/Feeding: Set up;Sitting   Grooming: Wash/dry hands;Wash/dry face;Contact guard assist   Upper Body Bathing: Minimal assistance;Sitting   Lower Body Bathing: Maximal assistance   Upper Body Dressing : Minimal assistance;Sitting   Lower Body Dressing: Maximal  assistance       Toileting- Clothing Manipulation and Hygiene: Total assistance;Bed level       Functional mobility during ADLs: Moderate assistance;Cueing for safety;Cueing for sequencing;Rolling walker (2 wheels) General ADL Comments: pt sat EOB, sit  - stand x 2 trials mod A +2     Vision Ability to See in Adequate Light: 0 Adequate Patient Visual Report: No change from baseline       Perception         Praxis         Pertinent Vitals/Pain Pain Assessment Pain Assessment: No/denies pain     Extremity/Trunk Assessment Upper Extremity Assessment Upper Extremity Assessment: Generalized weakness   Lower Extremity Assessment Lower Extremity Assessment: Defer to PT evaluation   Cervical / Trunk Assessment Cervical / Trunk Assessment: Normal   Communication Communication Communication: No apparent difficulties   Cognition Arousal: Alert Behavior During Therapy: WFL for tasks assessed/performed Cognition: History of cognitive impairments                               Following commands: Intact       Cueing  General Comments    Verbal, tactile and gestural cues       Exercises     Shoulder Instructions      Home Living Family/patient expects to be discharged to:: Skilled nursing facility                             Home Equipment: Wheelchair - manual          Prior Functioning/Environment Prior Level of Function : Needs assist;History of Falls (last six months)             Mobility Comments: Stands and pivots to [facility] with no AD, sometimes has help to propel WC. At least 2 falls, pt unable to recall what happened ADLs Comments: Eats in dining area, reports ind with  dressing with staff close by for bathing    OT Problem List: Decreased strength;Impaired balance (sitting and/or standing);Decreased cognition;Decreased safety awareness;Decreased activity tolerance;Decreased knowledge of use of DME or AE   OT Treatment/Interventions:        OT Goals(Current goals can be found in the care plan section)   Acute Rehab OT Goals Patient Stated Goal: none stated OT Goal Formulation: With patient Time For Goal Achievement: 08/12/23 Potential to Achieve Goals: Good ADL  Goals Pt Will Perform Grooming: with supervision;with set-up;sitting Pt Will Perform Upper Body Bathing: with contact guard assist;with supervision;sitting Pt Will Perform Upper Body Dressing: with contact guard assist;with supervision;sitting Pt Will Transfer to Toilet: with mod assist;with min assist;stand pivot transfer Pt Will Perform Toileting - Clothing Manipulation and hygiene: with mod assist;with min assist;sitting/lateral leans   OT Frequency:  Min 2X/week    Co-evaluation PT/OT/SLP Co-Evaluation/Treatment: Yes Reason for Co-Treatment: Necessary to address cognition/behavior during functional activity;For patient/therapist safety;To address functional/ADL transfers   OT goals addressed during session: ADL's and self-care;Proper use of Adaptive equipment and DME      AM-PAC OT 6 Clicks Daily Activity     Outcome Measure Help from another person eating meals?: A Little Help from another person taking care of personal grooming?: A Little Help from another person toileting, which includes using toliet, bedpan, or urinal?: Total Help from another person bathing (including washing, rinsing, drying)?: A Lot Help from another person to put on and taking  off regular upper body clothing?: A Little Help from another person to put on and taking off regular lower body clothing?: A Lot 6 Click Score: 14   End of Session Equipment Utilized During Treatment: Gait belt;Rolling walker (2 wheels) Nurse Communication: Mobility status  Activity Tolerance: Patient limited by fatigue Patient left: in bed;with call bell/phone within reach;with bed alarm set  OT Visit Diagnosis: Unsteadiness on feet (R26.81);Other abnormalities of gait and mobility (R26.89)                Time: 8763-8698 OT Time Calculation (min): 25 min Charges:  OT General Charges $OT Visit: 1 Visit OT Evaluation $OT Eval Moderate Complexity: 1 Mod   Jacques Karna Loose 07/29/2023, 2:24 PM

## 2023-07-29 NOTE — Consult Note (Signed)
 NAME:  Anthony Bhardwaj., MRN:  [id number], DOB:  [birth year], LOS: 3 ADMISSION DATE:  07/26/2023, CONSULTATION DATE:  07/29/23 REFERRING MD:  Rosario CHIEF COMPLAINT:  Hypoxia   History of Present Illness:  Anthony Formica. is a 73 y.o. male who has a PMH as below including but not limited to COPD on 3L O2, ILD, past COVID, reported CHF (though echo from 7/18 with normal EF, no diastolic dysfunction. Echo from 2023 similar), PAF on Apixaban , PAD s/p L AKA. He resides at [facility].  He was admitted 07/26/23 after a fall and was found to have dyspnea and hypoxia. He had CT chest that demonstrated known emphysema along with bilateral lower lobe and RML consolidations. RVP was negative. He was started on CTX and Azithromycin .  Since admission, he had increasing O2 requirements. Azithromycin  had been stopped after 3 days. On 7/21, he was up to 100% FiO2 and 45L/min HHFNC. PCCM was subsequently asked to see in consultation for any additional recommendations. He has been diuresed and is -4L since admit. BNP from 7/20 was 245. He has been on Solumedrol 80mg  q12hrs since admit.  Pertinent  Medical History:  has CAD (coronary artery disease); Hyperlipidemia associated with type 2 diabetes mellitus (HCC); COPD (chronic obstructive pulmonary disease) (HCC); Tobacco abuse; Mood disorder (HCC); Bilateral inguinal hernia; Umbilical hernia; S/P bilateral inguinal hernia repair; Chronic right hip pain; Recurrent left inguinal hernia; S/P right THA, AA; Moderate episode of recurrent major depressive disorder (HCC); Rectal bleeding; Obesity (BMI 30-39.9); DM2 (diabetes mellitus, type 2) (HCC); Pain medication agreement signed; Aortic atherosclerosis (HCC); Insomnia; Multiple lung nodules; Chronic fatigue; Generalized weakness; Vitamin D  deficiency; Elevated vitamin B12 level; Acute neutrophilia; Spinal stenosis of lumbar region with neurogenic claudication; Chronic constipation; Chronic pain of right knee;  Cerebellar ataxia in diseases classified elsewhere [facility]); Fall against object; Right flank pain; Chronic respiratory failure with hypoxia (HCC); Hypokalemia; Bipolar 1 disorder (HCC); Pressure injury of skin; Rib fractures; History of DVT (deep vein thrombosis); HTN (hypertension); Paroxysmal atrial fibrillation with RVR (HCC); ILD (interstitial lung disease) (HCC); Acute exacerbation of CHF (congestive heart failure) (HCC); and Acute respiratory failure with hypoxemia (HCC) on their problem list.  Significant Hospital Events: Including procedures, antibiotic start and stop dates in addition to other pertinent events   7/18 admit 7/21 up to 45L and 100% FiO2  Interim History / Subjective:  Feels better then normal despite being on HHFNC at 100% and 45L/min. Denies missing any of his meds including Apixaban . Has dry cough. No fevers/chills/sweats.  Objective:  Blood pressure 133/81, pulse (!) 139, temperature 98 F (36.7 C), temperature source Oral, resp. rate (!) 27, height 5' 9 (1.753 m), weight 79.5 kg, SpO2 91%.    FiO2 (%):  [85 %-100 %] 100 %   Intake/Output Summary (Last 24 hours) at 07/29/2023 1558 Last data filed at 07/29/2023 1447 Gross per 24 hour  Intake 1293 ml  Output 2550 ml  Net -1257 ml   Filed Weights   07/27/23 2349 07/28/23 0252 07/29/23 0431  Weight: 81 kg 81 kg 79.5 kg     Physical Exam: General: Adult male, chronically ill appearing, resting in bed, in NAD. Neuro: A&O x 3, no deficits. HEENT: [place]/AT. Sclerae anicteric. EOMI. Cardiovascular: RRR, no M/R/G.  Lungs: Respirations even and unlabored.  CTA bilaterally, No W/R/R. Abdomen: BS x 4, soft, NT/ND.  Musculoskeletal: L AKA no edema.    Labs/imaging personally reviewed:  CT chest 7/18 > known emphysema along with bilateral  lower lobe and RML consolidations.  Assessment & Plan:   Acute on chronic hypoxic respiratory failure - multifactorial with known COPD (with exacerbation) and ILD and now  superimposed bilateral PNA. Also possibly mild pulmonary edema though echo reassuring. Highly doubt PE as he is on Apixaban  and resides at SNF so unlikely to have missed doses. - Continue supplemental O2 as needed to maintain SpO2 > 88%. Suspect he will take time to resolve given frail lungs at baseline. - Continue CTX, add back Azithromycin . - Check COVID, urine strep, urine legionella. - Consider adding further gram negative coverage if no improvement over next few days. - Continue diuresis x 2 more doses. - Continue steroids for now, decrease dose from 80mg  BID to 60mg  BID (not currently bronchospastic). - Continue BD's - regimen adjusted to include triple therapy nebs with PRN SABA. - Bronchial hygiene. - Mobilize when able.  DNR/DNI. - Supportive care as above.  Rest per primary team.  Best practice (evaluated daily):  Per primary team.  Labs   CBC: Recent Labs  Lab 07/26/23 0201 07/26/23 0210 07/27/23 0721 07/29/23 0234  WBC  --  21.8* 15.6* 17.5*  NEUTROABS  --  19.0* 14.6* 16.3*  HGB 11.2* 9.3* 9.9* 9.6*  HCT 33.0* 34.5* 35.3* 34.7*  MCV  --  72.9* 70.9* 70.2*  PLT  --  299 377 366    Basic Metabolic Panel: Recent Labs  Lab 07/26/23 0201 07/26/23 0210 07/27/23 0721 07/29/23 0234  NA 136 134* 139  139 137  K 4.8 4.8 4.4  4.4 4.5  CL  --  102 97*  98 101  CO2  --  21* 24  24 22   GLUCOSE  --  175* 172*  173* 249*  BUN  --  24* 32*  31* 48*  CREATININE  --  2.21* 1.46*  1.45* 1.21  CALCIUM   --  9.0 9.0  9.1 9.3  MG  --   --  2.0  --   PHOS  --   --  4.0  4.1 3.6   GFR: Estimated Creatinine Clearance: 53.6 mL/min (by C-G formula based on SCr of 1.21 mg/dL). Recent Labs  Lab 07/26/23 0210 07/27/23 0721 07/29/23 0234  PROCALCITON  --  1.25  --   WBC 21.8* 15.6* 17.5*    Liver Function Tests: Recent Labs  Lab 07/26/23 0210 07/27/23 0721 07/29/23 0234  AST 23  --   --   ALT 14  --   --   ALKPHOS 57  --   --   BILITOT 0.9  --   --    PROT 7.2  --   --   ALBUMIN  3.3* 3.2* 3.2*   No results for input(s): LIPASE, AMYLASE in the last 168 hours. No results for input(s): AMMONIA in the last 168 hours.  ABG    Component Value Date/Time   PHART 7.41 07/29/2023 0031   PCO2ART 41 07/29/2023 0031   PO2ART 64 (L) 07/29/2023 0031   HCO3 26.0 07/29/2023 0031   TCO2 24 07/26/2023 0201   ACIDBASEDEF 2.0 07/26/2023 0201   O2SAT 92.3 07/29/2023 0031     Coagulation Profile: No results for input(s): INR, PROTIME in the last 168 hours.  Cardiac Enzymes: No results for input(s): CKTOTAL, CKMB, CKMBINDEX, TROPONINI in the last 168 hours.  HbA1C: HB A1C (BAYER DCA - WAIVED)  Date/Time Value Ref Range Status  06/04/2017 12:34 PM 7.4 (H) <7.0 % Final    Comment:  Diabetic Adult            <7.0                                       Healthy Adult        4.3 - 5.7                                                           (DCCT/NGSP) American Diabetes Association's Summary of Glycemic Recommendations for Adults with Diabetes: Hemoglobin A1c <7.0%. More stringent glycemic goals (A1c <6.0%) may further reduce complications at the cost of increased risk of hypoglycemia.   02/15/2017 04:17 PM 7.6 (H) <7.0 % Final    Comment:                                          Diabetic Adult            <7.0                                       Healthy Adult        4.3 - 5.7                                                           (DCCT/NGSP) American Diabetes Association's Summary of Glycemic Recommendations for Adults with Diabetes: Hemoglobin A1c <7.0%. More stringent glycemic goals (A1c <6.0%) may further reduce complications at the cost of increased risk of hypoglycemia.    Hgb A1c MFr Bld  Date/Time Value Ref Range Status  07/27/2023 07:21 AM 6.5 (H) 4.8 - 5.6 % Final    Comment:    (NOTE) Diagnosis of Diabetes The following HbA1c ranges recommended by the  American Diabetes Association (ADA) may be used as an aid in the diagnosis of diabetes mellitus.  Hemoglobin             Suggested A1C NGSP%              Diagnosis  <5.7                   Non Diabetic  5.7-6.4                Pre-Diabetic  >6.4                   Diabetic  <7.0                   Glycemic control for                       adults with diabetes.    02/12/2021 12:54 PM 7.7 (H) 4.8 - 5.6 % Final    Comment:    (NOTE) Pre diabetes:          5.7%-6.4%  Diabetes:              >  6.4%  Glycemic control for   <7.0% adults with diabetes     CBG: Recent Labs  Lab 07/28/23 1109 07/28/23 1651 07/28/23 2056 07/29/23 0630 07/29/23 1129  GLUCAP 236* 354* 140* 244* 205*    Review of Systems:   All negative; except for those that are bolded, which indicate positives.  Constitutional: weight loss, weight gain, night sweats, fevers, chills, fatigue, weakness.  HEENT: headaches, sore throat, sneezing, nasal congestion, post nasal drip, difficulty swallowing, tooth/dental problems, visual complaints, visual changes, ear aches. Neuro: difficulty with speech, weakness, numbness, ataxia. CV:  chest pain, orthopnea, PND, swelling in lower extremities, dizziness, palpitations, syncope.  Resp: cough, hemoptysis, dyspnea, wheezing. GI: heartburn, indigestion, abdominal pain, nausea, vomiting, diarrhea, constipation, change in bowel habits, loss of appetite, hematemesis, melena, hematochezia.  GU: dysuria, change in color of urine, urgency or frequency, flank pain, hematuria. MSK: joint pain or swelling, decreased range of motion. Psych: change in mood or affect, depression, anxiety, suicidal ideations, homicidal ideations. Skin: rash, itching, bruising.   Past Medical History:  He,  has a past medical history of Anxiety, Arthritis, Atrial fibrillation (HCC), Atrial flutter (HCC), Bipolar disorder (HCC), CAD (coronary artery disease), COPD (chronic obstructive pulmonary disease)  (HCC), Diabetes mellitus (HCC), History of DVT (deep vein thrombosis), HLD (hyperlipidemia), Hypertension, Left above-knee amputee (HCC), PAD (peripheral artery disease) (HCC), Polysubstance abuse (HCC), Tobacco abuse, Urinary frequency, and Vitamin D  deficiency (09/05/2018).   Surgical History:   Past Surgical History:  Procedure Laterality Date   CARDIAC CATHETERIZATION     COLONOSCOPY N/A 12/29/2015   Procedure: COLONOSCOPY;  Surgeon: Anthony Anthony Rivet, MD;  [facility];  Service: Endoscopy;  Laterality: N/A;  12:55   ELECTROCARDIOGRAM     Showed inferolateral ST-elevation and a code STEMI was activated. In the ER, he was treated with morphine , herarin, and 600 mg of Plavix. He was transferred emergently to [facility].    INGUINAL HERNIA REPAIR Left 07/26/2014   Procedure: OPEN REPAIR OF RECURENT LEFT INGUINAL HERNIA REPAIR WITH MESH;  Surgeon: Camellia Blush, MD;  Location: [facility];  Service: General;  Laterality: Left;   INSERTION OF MESH Bilateral 10/30/2013   Procedure: INSERTION OF MESH;  Surgeon: Camellia CHRISTELLA Blush, MD;  Location: [facility];  Service: General;  Laterality: Bilateral;   LAPAROSCOPIC INGUINAL HERNIA WITH UMBILICAL HERNIA Bilateral 10/30/2013   Procedure: laparoscopic repair left pantaloom hernia with mesh, laparoscopic right inguinal hernia with mesh, OPEN REPAIR OF UMBILICAL HERNIA ;  Surgeon: Camellia CHRISTELLA Blush, MD;  Location: [facility];  Service: General;  Laterality: Bilateral;   LAPAROSCOPY N/A 07/26/2014   Procedure: LAPAROSCOPY DIAGNOSTIC;  Surgeon: Camellia Blush, MD;  Location: [facility];  Service: General;  Laterality: N/A;   POLYPECTOMY  12/29/2015   Procedure: POLYPECTOMY;  Surgeon: Anthony Anthony Rivet, MD;  [facility];  Service: Endoscopy;;  ascending colon, descending colon   stent placement     TOTAL HIP ARTHROPLASTY Right 05/10/2015   Procedure: RIGHT TOTAL HIP ARTHROPLASTY ANTERIOR APPROACH;  Surgeon: Donnice Car, MD;  Location: [facility];  Service:  Orthopedics;  Laterality: Right;   VASECTOMY       Social History:   reports that he has quit smoking. His smoking use included cigarettes. He has a 67.5 pack-year smoking history. He has never used smokeless tobacco. He reports that he does not drink alcohol  and does not use drugs.   Family History:  His family history includes Coronary artery disease in his father; Depression in his  father and mother; Heart attack in his father.   Allergies Allergies  Allergen Reactions   Flomax [Tamsulosin Hcl] Other (See Comments)    This medication makes patient feel sick   Penicillins Other (See Comments)    Reaction unknown occurred during childhood Has patient had a PCN reaction causing immediate rash, facial/tongue/throat swelling, SOB or lightheadedness with hypotension: unsure - childhood reaction Has patient had a PCN reaction causing severe rash involving mucus membranes or skin necrosis: unsure - childhood reaction Has patient had a PCN reaction that required hospitalization no Has patient had a PCN reaction occurring within the last 10 years: no If all of the above answers are NO, then may p     Home Medications  Prior to Admission medications   Medication Sig Start Date End Date Taking? Authorizing Provider  acetaminophen  (TYLENOL ) 500 MG tablet Take 1,000 mg by mouth in the morning, at noon, and at bedtime.   Yes [provider]  apixaban  (ELIQUIS ) 5 MG TABS tablet Take 1 tablet (5 mg total) by mouth 2 (two) times daily. 03/03/21  Yes [name], MD  atorvastatin  (LIPITOR) 40 MG tablet Take 1 tablet (40 mg total) by mouth daily. 12/22/18  Yes Alexander, Natalie, DO  Baclofen  5 MG TABS Take 5 mg by mouth in the morning and at bedtime.   Yes [provider]  diphenhydrAMINE -Phenylephrine  12.5-5 MG/5ML SOLN Take 10 mLs by mouth in the morning and at bedtime.   Yes [provider]  escitalopram  (LEXAPRO ) 10 MG tablet Take 1 tablet (10 mg total) by  mouth at bedtime. 03/03/21  Yes [name], MD  eszopiclone (LUNESTA) 2 MG TABS tablet Take 2 mg by mouth at bedtime as needed. 07/04/23  Yes [provider]  gabapentin  (NEURONTIN ) 100 MG capsule Take 100 mg by mouth 2 (two) times daily.   Yes [provider]  glipiZIDE  (GLUCOTROL ) 10 MG tablet Take 1 tablet (10 mg total) by mouth 2 (two) times daily before a meal. Patient taking differently: Take 10 mg by mouth daily before breakfast. 12/22/18  Yes Alexander, Natalie, DO  guaiFENesin  (MUCINEX ) 600 MG 12 hr tablet Take 600 mg by mouth 2 (two) times daily.   Yes [provider]  ipratropium-albuterol  (DUONEB) 0.5-2.5 (3) MG/3ML SOLN Take 3 mLs by nebulization 3 (three) times daily.   Yes [provider]  isosorbide  mononitrate (IMDUR ) 30 MG 24 hr tablet Take 15 mg by mouth daily.   Yes [provider]  Melatonin 10 MG TABS Take 10 mg by mouth at bedtime.   Yes [provider]  metFORMIN  (GLUCOPHAGE -XR) 500 MG 24 hr tablet TAKE 2 TABLETS BY MOUTH  DAILY WITH BREAKFAST Patient taking differently: Take 1,000 mg by mouth in the morning and at bedtime. 01/18/20  Yes [name], DO  metoprolol  succinate (TOPROL -XL) 25 MG 24 hr tablet Take 12.5 mg by mouth daily.   Yes [provider]  nystatin ointment (MYCOSTATIN) Apply 1 Application topically 2 (two) times daily. Apply thin layer to groin area   Yes [provider]  Olopatadine HCl (PATADAY) 0.2 % SOLN Place 1 drop into both eyes daily.   Yes [provider]  Polyethyl Glycol-Propyl Glycol (SYSTANE) 0.4-0.3 % SOLN Place 1 drop into both eyes as needed (for dryness and irritation).   Yes [provider]  polyethylene glycol (MIRALAX  / GLYCOLAX ) 17 g packet Take 17 g by mouth every other day.   Yes [provider]  predniSONE  (DELTASONE )  20 MG tablet Take 20 mg by mouth daily with breakfast. For wheezing from 7/16 to 7/19   Yes [provider]  Semaglutide  7 MG TABS Take 7 mg by mouth daily.   Yes [provider]  sodium chloride  (OCEAN) 0.65 % nasal spray Place 1 spray into the nose 3 (three) times daily.   Yes [provider]  Tiotropium Bromide-Olodaterol (STIOLTO RESPIMAT ) 2.5-2.5 MCG/ACT AERS Inhale 2 puffs into the lungs daily. 03/28/21  Yes [name], MD  traZODone  (DESYREL ) 100 MG tablet Take 200 mg by mouth at bedtime. 12/11/21  Yes [provider]  traZODone  (DESYREL ) 50 MG tablet Take 25 mg by mouth at bedtime. Take along with 200mg  for a dose of 225mg  nightly 07/08/23  Yes [provider]  albuterol  (VENTOLIN  HFA) 108 (90 Base) MCG/ACT inhaler Inhale 2 puffs into the lungs in the morning, at noon, and at bedtime. Patient not taking: Reported on 07/26/2023    [provider]  blood glucose meter kit and supplies KIT Dispense based on patient and insurance preference. Use to check BG once daily or every other other.  Dx: E11.9 07/17/16   Lavell Bari LABOR, FNP  Menthol , Topical Analgesic, (BIOFREEZE COOL THE PAIN) 4 % GEL Apply 1 application  topically 2 (two) times daily as needed (for painful joints). Patient not taking: Reported on 07/26/2023    [provider]      Sammi Gore, PA - [facility] For pager details, please see AMION or use Epic chat  After 1900, please call [facility] for cross coverage needs 07/29/2023, 3:58 PM

## 2023-07-29 NOTE — Progress Notes (Signed)
 Physical Therapy Treatment Patient Details Name: Anthony [name]. MRN: [id number] DOB: December 23, 1950 Today's Date: 07/29/2023   History of Present Illness 73 y.o. male [facility] to [facility] 07/26/23 from [place] [facility] home after sliding off the bed with AMS and hypoxia. Admitted with acute hypoxic respiratory failure, PNA, and acute encephalopathy. PMHx: Dementia, ILD, COPD, CAD with hx inferior STEMI, RCA PCI, diastolic HF, paroxysmal A-fib, DVT, on anticoagulation, PAD with L AKA. diabetes type 2, hyperlipidemia.    PT Comments  Pt received in supine, agreeable to therapy session and with good participation and fair tolerance for seated/standing activity. Pt SpO2 desat to 80% with exertional tasks and HR elevated to 161 bpm max with standing trial. SpO2 recovers to ~88% on HHFNC with seated rest breaks and cues for pursed-lip breathing, but pt often taking mask out of nares to blow his nose and remains diaphoretic, RN notified of VS. Pt cooperative but limited due to frequent coughing and fatigue, needing up to modA +2 for safety with sit<>stand transfers with RW and pt stood to urinate, otherwise worked on seated balance and lateral scooting to simulate scoot transfer to chair/wheelchair. Patient will benefit from continued inpatient follow up therapy, <3 hours/day.    If plan is discharge home, recommend the following: A lot of help with bathing/dressing/bathroom;Assistance with cooking/housework;Assist for transportation;Help with stairs or ramp for entrance;Two people to help with walking and/or transfers;Supervision due to cognitive status   Can travel by private vehicle     No  Equipment Recommendations  None recommended by PT (LT facility has needed DME per chart review)    Recommendations for Other Services       Precautions / Restrictions Precautions Precautions: Fall Recall of Precautions/Restrictions: Impaired Precaution/Restrictions Comments: watch O2; impulsive Restrictions Weight  Bearing Restrictions Per Provider Order: No     Mobility  Bed Mobility Overal bed mobility: Needs Assistance Bed Mobility: Rolling, Sidelying to Sit, Sit to Sidelying Rolling: Contact guard assist Sidelying to sit: Mod assist, Used rails     Sit to sidelying: Min assist, +2 for safety/equipment General bed mobility comments: ModA to raise trunk from sidelying, MinA for return to supine for slight LE management. Performed x5 lateral scoots towards HOB using RLE and BUE.    Transfers Overall transfer level: Needs assistance Equipment used: Rolling walker (2 wheels) Transfers: Sit to/from Stand, Bed to chair/wheelchair/BSC Sit to Stand: Mod assist, +2 physical assistance          Lateral/Scoot Transfers: Min assist General transfer comment: EOB<>RW x1 trial, pt motivated by desire to urinate standing up, standing tolerance limited due to coughing/DOE and pt defers lateral hops toward HOB in RW due to fatigue/coughing; +2 lift and lowering assist for safety due to pt fatigue, DOE and cognitive deficit. MinA to scoot x5 toward [facility] to simulate lateral scoot transfer to drop arm WC, pt needing intermittent minA for trunk stability and cues for technique.        Balance Overall balance assessment: Needs assistance, Mild deficits observed, not formally tested, History of Falls Sitting-balance support: Bilateral upper extremity supported, Feet supported Sitting balance-Leahy Scale: Fair     Standing balance support: Bilateral upper extremity supported, Reliant on assistive device for balance Standing balance-Leahy Scale: Poor Standing balance comment: RW and external assist needed for safety                            Communication Communication Communication: No apparent difficulties  Cognition  Arousal: Alert Behavior During Therapy: WFL for tasks assessed/performed   PT - Cognitive impairments: History of cognitive impairments, Problem solving, Safety/Judgement                          Following commands: Intact      Cueing Cueing Techniques: Verbal cues, Gestural cues  Exercises      General Comments General comments (skin integrity, edema, etc.): RR 21 rpm at rest, HHFNC 45L/min and FiO2 85%, pt intermittently removing cannula from nares to blow his nose which was runny/clear. Pt often coughing and encouraged to try bringing up phlegm to spit into blue bag but only able to expectorate once.  HR 64 bpm resting and up to 161 bpm in standing, improves to 120-130 bpm with seated rest break and decreased once in supine post-exertion. RN notified pt diaphoretic and frequently coughing with SpO2 variable 82-88% on HHFNC post-exertion in supine.      Pertinent Vitals/Pain Pain Assessment Pain Assessment: PAINAD Breathing: occasional labored breathing, short period of hyperventilation Negative Vocalization: none Facial Expression: facial grimacing Body Language: relaxed Consolability: distracted or reassured by voice/touch PAINAD Score: 4 Pain Intervention(s): Limited activity within patient's tolerance, Monitored during session, Repositioned (grimacing mainly when coughing.)    Home Living Family/patient expects to be discharged to:: Skilled nursing facility                 Home Equipment: Wheelchair - manual      Prior Function            PT Goals (current goals can now be found in the care plan section) Acute Rehab PT Goals Patient Stated Goal: to feel better PT Goal Formulation: With patient Time For Goal Achievement: 08/09/23 Progress towards PT goals: Progressing toward goals    Frequency    Min 2X/week      PT Plan      Co-evaluation PT/OT/SLP Co-Evaluation/Treatment: Yes Reason for Co-Treatment: Necessary to address cognition/behavior during functional activity;For patient/therapist safety;To address functional/ADL transfers PT goals addressed during session: Mobility/safety with mobility;Balance OT goals  addressed during session: ADL's and self-care;Proper use of Adaptive equipment and DME      AM-PAC PT 6 Clicks Mobility   Outcome Measure  Help needed turning from your back to your side while in a flat bed without using bedrails?: A Little Help needed moving from lying on your back to sitting on the side of a flat bed without using bedrails?: A Lot Help needed moving to and from a bed to a chair (including a wheelchair)?: A Lot Help needed standing up from a chair using your arms (e.g., wheelchair or bedside chair)?: A Lot Help needed to walk in hospital room?: Total Help needed climbing 3-5 steps with a railing? : Total 6 Click Score: 11    End of Session Equipment Utilized During Treatment: Oxygen;Gait belt Activity Tolerance: Patient limited by fatigue;Treatment limited secondary to medical complications (Comment);Other (comment) (tachy with standing trial) Patient left: in bed;with call bell/phone within reach;with bed alarm set;Other (comment) (bed in chair posture with HOB 47 degrees) Nurse Communication: Mobility status;Precautions;Other (comment) (SpO2/HR) PT Visit Diagnosis: Other abnormalities of gait and mobility (R26.89);Muscle weakness (generalized) (M62.81);History of falling (Z91.81)     Time: 8764-8697 PT Time Calculation (min) (ACUTE ONLY): 27 min  Charges:    $Therapeutic Activity: 8-22 mins PT General Charges $$ ACUTE PT VISIT: 1 Visit  Connell SQUIBB., PTA Acute Rehabilitation Services Secure Chat Preferred 9a-5:30pm Office: [phone]    Connell CHRISTELLA Blue 07/29/2023, 3:38 PM

## 2023-07-29 NOTE — Progress Notes (Addendum)
 Tele-safety sitter was requested since yesterday. The equipment has not been available. We will hand off to the day shift team to follow up with the equipment availability as soon as possible.    Pt is high risk of fall, and self destruction, requires a safety sitter for poor judgement and safety awareness, restless, keeps pulling O2 devices and heart monitoring and frequently trying to get out of bed.    Pt is redirectable, but requiring frequently reminder. Pt has underline dementia. Possibly agitations and restless from delirium and/or side effect of solumedrol, Seroquel , which Melatonin does not help him sleep at night. We will discuss the care plan and hand off to the day shift team for further sleep regimen management.   Wendi Dash, RN

## 2023-07-29 NOTE — [place] FL2 (Signed)
 [place]  MEDICAID FL2 LEVEL OF CARE FORM     IDENTIFICATION  Patient Name: Anthony [name]. Birthdate: March 07, 1950 Sex: male Admission Date (Current Location): 07/26/2023  [facility] [address] and [place] Number:  Producer, television/film/video and Address:  [facility] [place], [address], [place], [place] [address]      Provider Number: [id number]  Attending Physician Name and Address:  Rosario Leatrice FERNS, MD  Relative Name and Phone Number:  Cena Potters; Daughter; [phone]    Current Level of Care: Hospital Recommended Level of Care: Skilled Nursing Facility Prior Approval Number:    Date Approved/Denied:   PASRR Number: [id number]  Discharge Plan: SNF    Current Diagnoses: Patient Active Problem List   Diagnosis Date Noted   Acute exacerbation of CHF (congestive heart failure) (HCC) 07/26/2023   Acute respiratory failure with hypoxemia (HCC) 07/26/2023   ILD (interstitial lung disease) (HCC) 03/28/2021   Paroxysmal atrial fibrillation with RVR (HCC) 02/21/2021   Pressure injury of skin 02/13/2021   Rib fractures 02/13/2021   History of DVT (deep vein thrombosis) 02/13/2021   HTN (hypertension) 02/13/2021   Chronic respiratory failure with hypoxia (HCC) 02/12/2021   Hypokalemia 02/12/2021   Bipolar 1 disorder (HCC) 02/12/2021   Right flank pain 11/01/2019   Fall against object 09/25/2019   Cerebellar ataxia in diseases classified elsewhere (HCC) 08/26/2019   Chronic pain of right knee 07/14/2019   Chronic constipation 03/24/2019   Spinal stenosis of lumbar region with neurogenic claudication 11/14/2018   Vitamin D  deficiency 09/05/2018   Elevated vitamin B12 level 09/05/2018   Acute neutrophilia 09/05/2018   Chronic fatigue 08/29/2018   Generalized weakness 08/29/2018   Multiple lung nodules 08/03/2018   Insomnia 03/26/2017   Aortic atherosclerosis (HCC) 10/19/2016   Pain medication agreement signed 06/05/2016   DM2 (diabetes mellitus, type 2) (HCC)  05/25/2016   Obesity (BMI 30-39.9) 04/13/2016   Rectal bleeding 11/23/2015   Moderate episode of recurrent major depressive disorder (HCC) 10/18/2015   S/P right THA, AA 05/10/2015   Recurrent left inguinal hernia 07/26/2014   Chronic right hip pain 06/23/2014   S/P bilateral inguinal hernia repair 10/30/2013   Bilateral inguinal hernia 05/28/2013   Umbilical hernia 05/28/2013   Mood disorder (HCC)    CAD (coronary artery disease)    Hyperlipidemia associated with type 2 diabetes mellitus (HCC)    COPD (chronic obstructive pulmonary disease) (HCC)    Tobacco abuse     Orientation RESPIRATION BLADDER Height & Weight     Self, Place  Other (Comment) (HHFNC 50L NRB 15L) Incontinent, External catheter Weight: 175 lb 4.3 oz (79.5 kg) Height:  5' 9 (175.3 cm)  BEHAVIORAL SYMPTOMS/MOOD NEUROLOGICAL BOWEL NUTRITION STATUS      Incontinent Diet (Please see discharge summary)  AMBULATORY STATUS COMMUNICATION OF NEEDS Skin   Extensive Assist Verbally Normal                       Personal Care Assistance Level of Assistance  Feeding, Bathing, Dressing Bathing Assistance: Maximum assistance Feeding assistance: Maximum assistance Dressing Assistance: Maximum assistance     Functional Limitations Info             SPECIAL CARE FACTORS FREQUENCY  PT (By licensed PT), OT (By licensed OT)     PT Frequency: 5x OT Frequency: 5x            Contractures Contractures Info: Not present    Additional Factors Info  Code  Status, Allergies, Insulin  Sliding Scale Code Status Info: DNR-LIMITED -Do Not Intubate/DNI Allergies Info: Flomax (tamsulosin Hcl) and Penicillins   Insulin  Sliding Scale Info: Please see discharge summary       Current Medications (07/29/2023):  This is the current hospital active medication list Current Facility-Administered Medications  Medication Dose Route Frequency Provider Last Rate Last Admin   acetaminophen  (TYLENOL ) tablet 1,000 mg  1,000 mg Oral  Q6H PRN Segars, Dorn, MD       apixaban  (ELIQUIS ) tablet 5 mg  5 mg Oral BID Segars, Jonathan, MD   5 mg at 07/29/23 9070   artificial tears ophthalmic solution 1 drop  1 drop Both Eyes PRN Rosario Eland I, MD   1 drop at 07/29/23 9070   atorvastatin  (LIPITOR) tablet 40 mg  40 mg Oral Daily Segars, Jonathan, MD   40 mg at 07/29/23 9070   budesonide  (PULMICORT ) nebulizer solution 0.25 mg  0.25 mg Nebulization BID Ogbata, Sylvester I, MD   0.25 mg at 07/29/23 9171   cefTRIAXone  (ROCEPHIN ) 1 g in sodium chloride  0.9 % 100 mL IVPB  1 g Intravenous Q24H Segars, Jonathan, MD 200 mL/hr at 07/29/23 0505 1 g at 07/29/23 0505   escitalopram  (LEXAPRO ) tablet 10 mg  10 mg Oral QHS Segars, Jonathan, MD   10 mg at 07/28/23 2103   feeding supplement (GLUCERNA SHAKE) (GLUCERNA SHAKE) liquid 237 mL  237 mL Oral TID BM Rosario Eland I, MD   237 mL at 07/28/23 2121   ferrous sulfate  tablet 325 mg  325 mg Oral Q breakfast Rosario Eland I, MD   325 mg at 07/29/23 0502   guaiFENesin  (MUCINEX ) 12 hr tablet 600 mg  600 mg Oral BID Ogbata, Sylvester I, MD   600 mg at 07/29/23 9070   insulin  aspart (novoLOG ) injection 0-15 Units  0-15 Units Subcutaneous TID WC Rosario Eland I, MD   5 Units at 07/29/23 1131   ipratropium (ATROVENT ) nebulizer solution 0.5 mg  0.5 mg Nebulization TID Ogbata, Sylvester I, MD   0.5 mg at 07/29/23 9171   ipratropium-albuterol  (DUONEB) 0.5-2.5 (3) MG/3ML nebulizer solution 3 mL  3 mL Nebulization Q4H PRN Sundil, Subrina, MD       levalbuterol  (XOPENEX ) nebulizer solution 0.63 mg  0.63 mg Nebulization TID Rosario Eland I, MD   0.63 mg at 07/29/23 9171   melatonin tablet 6 mg  6 mg Oral QHS PRN Segars, Jonathan, MD   6 mg at 07/28/23 2103   methylPREDNISolone  sodium succinate (SOLU-MEDROL ) 40 mg/mL injection 80 mg  80 mg Intravenous Q12H Ogbata, Sylvester I, MD   80 mg at 07/29/23 9070   metoprolol  tartrate (LOPRESSOR ) injection 5 mg  5 mg Intravenous Q4H PRN Segars, Jonathan,  MD   5 mg at 07/27/23 1827   ondansetron  (ZOFRAN ) injection 4 mg  4 mg Intravenous Q6H PRN Segars, Dorn, MD       polyethylene glycol (MIRALAX  / GLYCOLAX ) packet 17 g  17 g Oral Daily PRN Segars, Jonathan, MD       QUEtiapine  (SEROQUEL ) tablet 12.5 mg  12.5 mg Oral QHS Ogbata, Sylvester I, MD   12.5 mg at 07/28/23 2121   sodium chloride  flush (NS) 0.9 % injection 3 mL  3 mL Intravenous Q12H Keturah Dorn, MD   3 mL at 07/29/23 1131   sodium chloride  HYPERTONIC 3 % nebulizer solution 4 mL  4 mL Nebulization BID Rosario Eland I, MD   4 mL at 07/29/23 9171     Discharge  Medications: Please see discharge summary for a list of discharge medications.  Relevant Imaging Results:  Relevant Lab Results:   Additional Information [id number]  Lauraine FORBES Saa, LCSW

## 2023-07-29 NOTE — Inpatient Diabetes Management (Signed)
 Inpatient Diabetes Program Recommendations  AACE/ADA: New Consensus Statement on Inpatient Glycemic Control (2015)  Target Ranges:  Prepandial:   less than 140 mg/dL      Peak postprandial:   less than 180 mg/dL (1-2 hours)      Critically ill patients:  140 - 180 mg/dL    Latest Reference Range & Units 07/27/23 07:21  Hemoglobin A1C 4.8 - 5.6 % 6.5 (H)  (H): Data is abnormally high  Latest Reference Range & Units 07/28/23 06:09 07/28/23 11:09 07/28/23 16:51 07/28/23 20:56  Glucose-Capillary 70 - 99 mg/dL 754 (H)  2 units Novolog   236 (H)  2 units Novolog   354 (H)  15 units Novolog   140 (H)  (H): Data is abnormally high   Admit from SNF with Acute on chronic hypoxic respiratory failure/ COPD with exacerbation/ILD/ Multilobar pneumonia    SNF DM Meds: Glipizide  10 mg daily          Metformin  1000 mg BID         Rybelsus  7 mg daily    Current Orders: Novolog  0-15 units TID     MD- Note pt getting Solumedrol 80 mg BID   CBGs >200  Please consider while pt getting Steroids:  1. Start Semglee  8 units daily (0.1 units/kg)  2. Start Novolog  Meal Coverage: Novolog  3 units TID with meals HOLD if pt NPO HOLD if pt eats <50% meals     --Will follow patient during hospitalization--  Adina Rudolpho Arrow RN, MSN, CDCES Diabetes Coordinator Inpatient Glycemic Control Team Team Pager: [phone] (8a-5p)

## 2023-07-29 NOTE — TOC Progression Note (Signed)
 Transition of Care (TOC) - Progression Note    Patient Details  Name: Anthony [name]. MRN: [id number] Date of Birth: [birth year]  Transition of Care [facility]) CM/SW Contact  Lauraine FORBES Saa, LCSW Phone Number: 07/29/2023, 11:49 AM  Clinical Narrative:     11:49 AM CSW sent fl2 to [facility] SN[facility] L[facility].   Expected Discharge Plan: Long Term Nursing Home Barriers to Discharge: Continued Medical Work up  Expected Discharge Plan and Services In-house Referral: Clinical Social Work   Post Acute Care Choice: Skilled Nursing Facility Living arrangements for the past 2 months: Skilled Nursing Facility                                       Social Determinants of Health (SDOH) Interventions SDOH Screenings   Food Insecurity: Patient Unable To Answer (07/26/2023)  Housing: Patient Unable To Answer (07/26/2023)  Transportation Needs: Patient Unable To Answer (07/26/2023)  Utilities: Patient Unable To Answer (07/26/2023)  Depression (PHQ2-9): Medium Risk (11/27/2019)  Financial Resource Strain: Low Risk  (09/15/2020)   [facility] from [facility]  Social Connections: Patient Unable To Answer (07/26/2023)  Stress: No Stress Concern Present (09/15/2020)   Received from [facility]  Tobacco Use: Medium Risk (07/26/2023)    Readmission Risk Interventions    02/13/2021    3:47 PM  Readmission Risk Prevention Plan  Transportation Screening Complete  PCP or Specialist Appt within 5-7 Days Complete  Home Care Screening Complete  Medication Review (RN CM) Complete

## 2023-07-29 NOTE — Progress Notes (Signed)
 ABG result and chest x-ray result below.   Latest Reference Range & Units Most Recent  Sample type  ARTERIAL 07/26/23 02:01  Delivery systems  NON-REBREATHER OXYGEN MASK 02/12/21 13:48  FIO2  100.00 02/12/21 13:48  pH, Arterial 7.35 - 7.45  7.41 07/29/23 00:31  pCO2 arterial 32 - 48 mmHg 41 07/29/23 00:31  pO2, Arterial 83 - 108 mmHg 64 (L) 07/29/23 00:31  TCO2 22 - 32 mmol/L 24 07/26/23 02:01  Acid-Base Excess 0.0 - 2.0 mmol/L 1.2 07/29/23 00:31  Acid-base deficit 0.0 - 2.0 mmol/L 2.0 07/26/23 02:01  Bicarbonate 20.0 - 28.0 mmol/L 26.0 07/29/23 00:31  O2 Saturation % 92.3 07/29/23 00:31  Patient temperature  36.8 07/29/23 00:31  Collection site  LEFT BRACHIAL 07/29/23 00:31  Allens test (pass/fail) PASS  PASS 07/29/23 00:31  (L): Data is abnormally low   EXAM: 1 VIEW XRAY OF THE CHEST 07/29/2023 12:40:00 AM FINDINGS:   LUNGS AND PLEURA: Mild peripheral scarring in the lungs bilaterally. Mild patchy bilateral lower lobe opacities, atelectasis versus pneumonia. No pleural effusion. No pneumothorax.   HEART AND MEDIASTINUM: Aortic atherosclerosis. No acute abnormality of the cardiac and mediastinal silhouettes.   BONES AND SOFT TISSUES: No acute osseous abnormality.   IMPRESSION: 1. Mild patchy bilateral lower lobe opacities, atelectasis versus pneumonia.    MD made aware.  We will continue to monitor.   Wendi Dash, RN

## 2023-07-29 NOTE — Progress Notes (Signed)
 PROGRESS NOTE    Anthony [name].  FMW:[id number] DOB: [birth year] DOA: 07/26/2023 PCP: No primary care provider on file.  Outpatient Specialists:     Brief Narrative:  Patient is a 73 year old male, morbidly obese, with past medical history significant for interstitial lung disease, COPD, COVID infection, coronary artery disease, diastolic heart failure, paroxysmal atrial fibrillation, DVT, peripheral artery disease s/p left above-knee amputation, diabetes mellitus type 2 and hyperlipidemia.  Patient is a skilled nursing facility resident.  Patient was admitted with altered mentation, acute on chronic respiratory failure, COPD with exacerbation, and multilobar pneumonia.  07/26/2023: Patient was admitted earlier today.  Patient seems to be more responsive.  Discussed with patient's daughter, Cena Potters, who happens to be patient's power of attorney.  Ms. Potters has confirmed that patient is DO NOT RESUSCITATE.  Will change to CODE STATUS.  Patient remains critically ill.  07/29/2023: Patient seen.  Respiratory symptoms continue to improve, however, patient still requiring high amount of supplemental oxygen.  Will consult pulmonary team.  Assessment & Plan:   Principal Problem:   Acute exacerbation of CHF (congestive heart failure) (HCC) Active Problems:   Acute respiratory failure with hypoxemia (HCC)   Acute on chronic hypoxic respiratory failure: COPD with exacerbation/ILD: - Patient continues to improve slowly. - Patient continues to require high amount of supplemental oxygen, with difficulty weaning patient. - Consult pulmonary team. - Continue IV steroids. - Continue nebulizers. - Complete course of nebs hypertonic saline. - Pulmonary toiletry  - Incentive spirometry and flutter valve device therapy as tolerated.   - Patient is DNR/DNI.  Possible CHF with exacerbation: - IV Lasix  80 Mg every 8 hourly x 3 doses.   - Strict I's and O's.   - See echo report.  Multilobar  pneumonia: - Follow final cultures. - Continue IV ceftriaxone .  Azithromycin  discontinued earlier. -Repeat chest x-ray continues to show improvement. - Nasal swab for MRSA came back negative.  Respiratory viral panel came back negative.  Acute encephalopathy, metabolic, cannot rule out toxic: - Resolved significantly. - Likely multifactorial.  History of PAD: - Status post left AKA.     DVT prophylaxis: Continue apixaban . Code Status: DO NOT RESUSCITATE/DO NOT INTUBATE Family Communication: Patient's daughter, Cena Mayor. Disposition Plan: Patient remains inpatient.   Consultants:  None.  Procedures:  Echo revealed: 1. Difficult acoustic windows Overall LV function is normal to mildly  decreased Would recomm limited imaging with Definity to evaluate regional  wall motion and overall LV function. . The left ventricle has no regional  wall motion abnormalities. Left  ventricular diastolic parameters were normal.   2. Right ventricular systolic function reduced. The right ventricular  size is normal.   3. The mitral valve is normal in structure. Trivial mitral valve  regurgitation.   4. The aortic valve is tricuspid. Aortic valve regurgitation is not  visualized. Aortic valve sclerosis/calcification is present, without any  evidence of aortic stenosis.   5. The inferior vena cava is dilated in size with <50% respiratory  variability, suggesting right atrial pressure of 15 mmHg.   Antimicrobials:  IV azithromycin  discontinued. IV Rocephin .   Subjective: - No new complaints. - Continues to require high amounts of supplemental oxygen. - No fever or chills.  Objective: Vitals:   07/29/23 0431 07/29/23 0432 07/29/23 0600 07/29/23 0726  BP:    (!) 141/88  Pulse: 96 87 83 76  Resp: (!) 29 20 (!) 24 20  Temp:    98 F (36.7 C)  TempSrc:  Oral  SpO2: (!) 80% (!) 77% 93% 91%  Weight: 79.5 kg     Height:        Intake/Output Summary (Last 24 hours) at 07/29/2023  0936 Last data filed at 07/29/2023 0600 Gross per 24 hour  Intake 1290 ml  Output 1350 ml  Net -60 ml   Filed Weights   07/27/23 2349 07/28/23 0252 07/29/23 0431  Weight: 81 kg 81 kg 79.5 kg    Examination:  General exam: Patient is obese.  Patient is awake and alert.  Patient is more communicative.  Respiratory system: Significantly improved air entry.   Cardiovascular system: S1 & S2  Gastrointestinal system: Abdomen is obese, soft and nontender.   Central nervous system: Awake and alert.  Extremities: Status post left AKA.  Data Reviewed: I have personally reviewed following labs and imaging studies  CBC: Recent Labs  Lab 07/26/23 0201 07/26/23 0210 07/27/23 0721 07/29/23 0234  WBC  --  21.8* 15.6* 17.5*  NEUTROABS  --  19.0* 14.6* 16.3*  HGB 11.2* 9.3* 9.9* 9.6*  HCT 33.0* 34.5* 35.3* 34.7*  MCV  --  72.9* 70.9* 70.2*  PLT  --  299 377 366   Basic Metabolic Panel: Recent Labs  Lab 07/26/23 0201 07/26/23 0210 07/27/23 0721 07/29/23 0234  NA 136 134* 139  139 137  K 4.8 4.8 4.4  4.4 4.5  CL  --  102 97*  98 101  CO2  --  21* 24  24 22   GLUCOSE  --  175* 172*  173* 249*  BUN  --  24* 32*  31* 48*  CREATININE  --  2.21* 1.46*  1.45* 1.21  CALCIUM   --  9.0 9.0  9.1 9.3  MG  --   --  2.0  --   PHOS  --   --  4.0  4.1 3.6   GFR: Estimated Creatinine Clearance: 53.6 mL/min (by C-G formula based on SCr of 1.21 mg/dL). Liver Function Tests: Recent Labs  Lab 07/26/23 0210 07/27/23 0721 07/29/23 0234  AST 23  --   --   ALT 14  --   --   ALKPHOS 57  --   --   BILITOT 0.9  --   --   PROT 7.2  --   --   ALBUMIN  3.3* 3.2* 3.2*   No results for input(s): LIPASE, AMYLASE in the last 168 hours. No results for input(s): AMMONIA in the last 168 hours. Coagulation Profile: No results for input(s): INR, PROTIME in the last 168 hours. Cardiac Enzymes: No results for input(s): CKTOTAL, CKMB, CKMBINDEX, TROPONINI in the last 168  hours. BNP (last 3 results) No results for input(s): PROBNP in the last 8760 hours. HbA1C: Recent Labs    07/27/23 0721  HGBA1C 6.5*   CBG: Recent Labs  Lab 07/28/23 0609 07/28/23 1109 07/28/23 1651 07/28/23 2056 07/29/23 0630  GLUCAP 245* 236* 354* 140* 244*   Lipid Profile: Recent Labs    07/27/23 0721  CHOL 97  HDL 44  LDLCALC 40  TRIG 67  CHOLHDL 2.2   Thyroid  Function Tests: Recent Labs    07/27/23 0721  TSH 0.770   Anemia Panel: Recent Labs    07/27/23 0721  VITAMINB12 187  FOLATE 6.6  FERRITIN 39  TIBC 372  IRON 8*   Urine analysis:    Component Value Date/Time   COLORURINE YELLOW 07/26/2023 0621   APPEARANCEUR HAZY (A) 07/26/2023 0621   LABSPEC 1.014 07/26/2023 9378  PHURINE 5.0 07/26/2023 0621   GLUCOSEU 50 (A) 07/26/2023 0621   HGBUR SMALL (A) 07/26/2023 0621   BILIRUBINUR NEGATIVE 07/26/2023 0621   KETONESUR NEGATIVE 07/26/2023 0621   PROTEINUR 30 (A) 07/26/2023 0621   UROBILINOGEN 0.2 05/05/2013 0633   NITRITE NEGATIVE 07/26/2023 0621   LEUKOCYTESUR NEGATIVE 07/26/2023 0621   Sepsis Labs: @LABRCNTIP (procalcitonin:4,lacticidven:4)  ) Recent Results (from the past 240 hours)  Resp panel by RT-PCR (RSV, Flu A&B, Covid) Anterior Nasal Swab     Status: None   Collection Time: 07/26/23  5:32 AM   Specimen: Anterior Nasal Swab  Result Value Ref Range Status   SARS Coronavirus 2 by RT PCR NEGATIVE NEGATIVE Final   Influenza A by PCR NEGATIVE NEGATIVE Final   Influenza B by PCR NEGATIVE NEGATIVE Final    Comment: (NOTE) The Xpert Xpress SARS-CoV-2/FLU/RSV plus assay is intended as an aid in the diagnosis of influenza from Nasopharyngeal swab specimens and should not be used as a sole basis for treatment. Nasal washings and aspirates are unacceptable for Xpert Xpress SARS-CoV-2/FLU/RSV testing.  Fact Sheet for Patients: BloggerCourse.com  Fact Sheet for Healthcare  Providers: SeriousBroker.it  This test is not yet approved or cleared by the [place]  FDA and has been authorized for detection and/or diagnosis of SARS-CoV-2 by FDA under an Emergency Use Authorization (EUA). This EUA will remain in effect (meaning this test can be used) for the duration of the COVID-19 declaration under Section 564(b)(1) of the Act, 21 U.S.C. section 360bbb-3(b)(1), unless the authorization is terminated or revoked.     Resp Syncytial Virus by PCR NEGATIVE NEGATIVE Final    Comment: (NOTE) Fact Sheet for Patients: BloggerCourse.com  Fact Sheet for Healthcare Providers: SeriousBroker.it  This test is not yet approved or cleared by the [place]  FDA and has been authorized for detection and/or diagnosis of SARS-CoV-2 by FDA under an Emergency Use Authorization (EUA). This EUA will remain in effect (meaning this test can be used) for the duration of the COVID-19 declaration under Section 564(b)(1) of the Act, 21 U.S.C. section 360bbb-3(b)(1), unless the authorization is terminated or revoked.  Performed at [facility], [address], [place], [place] [address]   Culture, blood (Routine X 2) w Reflex to ID Panel     Status: None (Preliminary result)   Collection Time: 07/26/23  5:24 PM   Specimen: BLOOD LEFT HAND  Result Value Ref Range Status   Specimen Description BLOOD LEFT HAND  Final   Special Requests   Final    BOTTLES DRAWN AEROBIC AND ANAEROBIC Blood Culture results may not be optimal due to an inadequate volume of blood received in culture bottles   Culture   Final    NO GROWTH 3 DAYS Performed at [facility], [address] [place], [place] [address]    Report Status PENDING  Incomplete  Culture, blood (Routine X 2) w Reflex to ID Panel     Status: None (Preliminary result)   Collection Time: 07/26/23  5:26 PM   Specimen: BLOOD RIGHT HAND  Result  Value Ref Range Status   Specimen Description BLOOD RIGHT HAND  Final   Special Requests   Final    BOTTLES DRAWN AEROBIC AND ANAEROBIC Blood Culture results may not be optimal due to an inadequate volume of blood received in culture bottles   Culture   Final    NO GROWTH 3 DAYS Performed at [facility], [address] [place], [place] [address]  Report Status PENDING  Incomplete  Respiratory (~20 pathogens) panel by PCR     Status: None   Collection Time: 07/26/23  5:55 PM   Specimen: Nasopharyngeal Swab; Respiratory  Result Value Ref Range Status   Adenovirus NOT DETECTED NOT DETECTED Final   Coronavirus 229E NOT DETECTED NOT DETECTED Final    Comment: (NOTE) The Coronavirus on the Respiratory Panel, DOES NOT test for the novel  Coronavirus (2019 nCoV)    Coronavirus HKU1 NOT DETECTED NOT DETECTED Final   Coronavirus NL63 NOT DETECTED NOT DETECTED Final   Coronavirus OC43 NOT DETECTED NOT DETECTED Final   Metapneumovirus NOT DETECTED NOT DETECTED Final   Rhinovirus / Enterovirus NOT DETECTED NOT DETECTED Final   Influenza A NOT DETECTED NOT DETECTED Final   Influenza B NOT DETECTED NOT DETECTED Final   Parainfluenza Virus 1 NOT DETECTED NOT DETECTED Final   Parainfluenza Virus 2 NOT DETECTED NOT DETECTED Final   Parainfluenza Virus 3 NOT DETECTED NOT DETECTED Final   Parainfluenza Virus 4 NOT DETECTED NOT DETECTED Final   Respiratory Syncytial Virus NOT DETECTED NOT DETECTED Final   Bordetella pertussis NOT DETECTED NOT DETECTED Final   Bordetella Parapertussis NOT DETECTED NOT DETECTED Final   Chlamydophila pneumoniae NOT DETECTED NOT DETECTED Final   Mycoplasma pneumoniae NOT DETECTED NOT DETECTED Final    Comment: Performed at [facility], [address] [place], [place] [address]  MRSA Next Gen by PCR, Nasal     Status: None   Collection Time: 07/27/23 12:40 AM   Specimen: Nasal Mucosa; Nasal Swab  Result Value Ref Range Status   MRSA by PCR Next Gen  NOT DETECTED NOT DETECTED Final    Comment: (NOTE) The GeneXpert MRSA Assay (FDA approved for NASAL specimens only), is one component of a comprehensive MRSA colonization surveillance program. It is not intended to diagnose MRSA infection nor to guide or monitor treatment for MRSA infections. Test performance is not FDA approved in patients less than 55 years old. Performed at [facility], [address] [place], [place] [address]          Radiology Studies: DG CHEST PORT 1 VIEW Result Date: 07/29/2023 EXAM: 1 VIEW XRAY OF THE CHEST 07/29/2023 12:40:00 AM COMPARISON: 07/27/2023 CXR CLINICAL HISTORY: SOB (shortness of breath) [id number]. Shortness of breath FINDINGS: LUNGS AND PLEURA: Mild peripheral scarring in the lungs bilaterally. Mild patchy bilateral lower lobe opacities, atelectasis versus pneumonia. No pleural effusion. No pneumothorax. HEART AND MEDIASTINUM: Aortic atherosclerosis. No acute abnormality of the cardiac and mediastinal silhouettes. BONES AND SOFT TISSUES: No acute osseous abnormality. IMPRESSION: 1. Mild patchy bilateral lower lobe opacities, atelectasis versus pneumonia. Electronically signed by: Pinkie Pebbles MD 07/29/2023 12:53 AM EDT RP Workstation: [id number]   DG CHEST PORT 1 VIEW Result Date: 07/27/2023 CLINICAL DATA:  Shortness of breath. EXAM: PORTABLE CHEST 1 VIEW COMPARISON:  Radiograph and CT yesterday FINDINGS: Lung volumes are low. Stable heart size and mediastinal contours. Again seen bibasilar and peripheral lower lung zone opacities. No pneumothorax. No large pleural effusion by radiograph. IMPRESSION: Low lung volumes with bibasilar and peripheral lower lung zone opacities, unchanged from yesterday. Electronically Signed   By: Andrea Gasman M.D.   On: 07/27/2023 18:04        Scheduled Meds:  apixaban   5 mg Oral BID   atorvastatin   40 mg Oral Daily   budesonide  (PULMICORT ) nebulizer solution  0.25 mg Nebulization BID   escitalopram   10  mg Oral QHS  feeding supplement (GLUCERNA SHAKE)  237 mL Oral TID BM   ferrous sulfate   325 mg Oral Q breakfast   guaiFENesin   600 mg Oral BID   insulin  aspart  0-15 Units Subcutaneous TID WC   ipratropium  0.5 mg Nebulization TID   levalbuterol   0.63 mg Nebulization TID   methylPREDNISolone  (SOLU-MEDROL ) injection  80 mg Intravenous Q12H   QUEtiapine   12.5 mg Oral QHS   sodium chloride  flush  3 mL Intravenous Q12H   sodium chloride  HYPERTONIC  4 mL Nebulization BID   Continuous Infusions:  cefTRIAXone  (ROCEPHIN )  IV 1 g (07/29/23 0505)     LOS: 3 days    Time spent: 55 minutes.    Leatrice Chapel, MD  [facility] Pager #: [phone] 7PM-7AM contact night coverage as above

## 2023-07-29 NOTE — Progress Notes (Signed)
   07/29/23 0018  Provider Notification  Provider Name/Title Blance RT  Date Provider Notified 07/29/23  Time Provider Notified 0018  Method of Notification Call  Notification Reason Change in status SPO2 80s% , HR 120, Afib, Pt is restless, diaphoreses, congested cough with rhonchi bilaterally on auscultations. RT suggested to put non-rebreather mask 15 LPM on top of HHFNCL Fio2 100% 55 LPM.  Provider response At bedside  Date of Provider Response 07/29/23  Time of Provider Response 0018   Wendi Dash, RN

## 2023-07-29 NOTE — Progress Notes (Signed)
   07/29/23 0030  Provider Notification  Provider Name/Title Dr. Lee  Date Provider Notified 07/29/23  Time Provider Notified 0036  Method of Notification Page  Notification Reason Change in status. Pt is restless, agitated. We need some help stabilized his respiration and help him rest. Pt is diaphoreses SPO2 70% on room air, 88-90% on HHFNCL, productive congested coughing and rhonchi bilaterally on auscultations. HR 100-130 occasionally Afib with mostly Sinus tachycardia. RR 24-28.   I called Blanche RT, she suggested to put non-rebreather mask 15 LPM on top of the heated high flow NCL, Fio2 now is 100% flow rate 55 LPM. Due to Pt is very restless, agitated, forgetful, so he is non-compliant with face mask. He keeps pulling his O2 and heart monitor out. We have to put soft mittens on him to preventing him from puling the devices.   Provider response Evaluate remotely;See new orders added DuoNeb nebulizer, ABG and an chest x-ray stat.   We cannot give any Ativan  or Benadryl  because it will depress the respiratory rate.. And the problem with hydralazine it will increase the heart rate.   Date of Provider Response 07/29/23  Time of Provider Response 0036   Wendi Dash, RN

## 2023-07-30 ENCOUNTER — Inpatient Hospital Stay (HOSPITAL_COMMUNITY)

## 2023-07-30 DIAGNOSIS — J9601 Acute respiratory failure with hypoxia: Secondary | ICD-10-CM | POA: Diagnosis not present

## 2023-07-30 DIAGNOSIS — J849 Interstitial pulmonary disease, unspecified: Secondary | ICD-10-CM | POA: Diagnosis not present

## 2023-07-30 DIAGNOSIS — J441 Chronic obstructive pulmonary disease with (acute) exacerbation: Secondary | ICD-10-CM | POA: Diagnosis not present

## 2023-07-30 DIAGNOSIS — J189 Pneumonia, unspecified organism: Secondary | ICD-10-CM | POA: Diagnosis not present

## 2023-07-30 DIAGNOSIS — J9621 Acute and chronic respiratory failure with hypoxia: Secondary | ICD-10-CM | POA: Diagnosis not present

## 2023-07-30 LAB — GLUCOSE, CAPILLARY
Glucose-Capillary: 244 mg/dL — ABNORMAL HIGH (ref 70–99)
Glucose-Capillary: 344 mg/dL — ABNORMAL HIGH (ref 70–99)
Glucose-Capillary: 281 mg/dL — ABNORMAL HIGH (ref 70–99)
Glucose-Capillary: 273 mg/dL — ABNORMAL HIGH (ref 70–99)
Glucose-Capillary: 258 mg/dL — ABNORMAL HIGH (ref 70–99)

## 2023-07-30 LAB — RENAL FUNCTION PANEL
Albumin: 3.2 g/dL — ABNORMAL LOW (ref 3.5–5.0)
Anion gap: 13 (ref 5–15)
BUN: 47 mg/dL — ABNORMAL HIGH (ref 8–23)
CO2: 27 mmol/L (ref 22–32)
Calcium: 9.5 mg/dL (ref 8.9–10.3)
Chloride: 98 mmol/L (ref 98–111)
Creatinine, Ser: 1.3 mg/dL — ABNORMAL HIGH (ref 0.61–1.24)
GFR, Estimated: 58 mL/min — ABNORMAL LOW (ref >60)
Glucose, Bld: 183 mg/dL — ABNORMAL HIGH (ref 70–99)
Phosphorus: 4.9 mg/dL — ABNORMAL HIGH (ref 2.5–4.6)
Potassium: 3.9 mmol/L (ref 3.5–5.1)
Sodium: 138 mmol/L (ref 135–145)

## 2023-07-30 LAB — MAGNESIUM: Magnesium: 1.8 mg/dL (ref 1.7–2.4)

## 2023-07-30 MED ORDER — SODIUM CHLORIDE 0.9% FLUSH
10.0000 mL | Freq: Two times a day (BID) | INTRAVENOUS | Status: DC
  Administered 2023-07-30 – 2023-08-11 (×25): 10 mL
  Administered 2023-08-12: 20 mL
  Administered 2023-08-13 – 2023-08-14 (×4): 10 mL

## 2023-07-30 MED ORDER — INSULIN GLARGINE-YFGN 100 UNIT/ML ~~LOC~~ SOLN
5.0000 [IU] | Freq: Once | SUBCUTANEOUS | Status: AC
  Administered 2023-07-30: 5 [IU] via SUBCUTANEOUS
  Filled 2023-07-30: qty 0.05

## 2023-07-30 MED ORDER — LEVALBUTEROL HCL 0.63 MG/3ML IN NEBU: 0.6300 mg | INHALATION_SOLUTION | Freq: Four times a day (QID) | RESPIRATORY_TRACT | Status: DC

## 2023-07-30 MED ORDER — TRAZODONE HCL 50 MG PO TABS
225.0000 mg | ORAL_TABLET | Freq: Every day | ORAL | Status: DC
  Administered 2023-07-30 – 2023-08-14 (×16): 225 mg via ORAL
  Filled 2023-07-30 (×2): qty 1
  Filled 2023-07-30 (×2): qty 5
  Filled 2023-07-30: qty 1
  Filled 2023-07-30: qty 5
  Filled 2023-07-30 (×2): qty 1
  Filled 2023-07-30: qty 5
  Filled 2023-07-30 (×2): qty 1
  Filled 2023-07-30 (×4): qty 5
  Filled 2023-07-30: qty 1

## 2023-07-30 MED ORDER — SODIUM CHLORIDE 0.9 % IV SOLN: INTRAVENOUS | Status: DC

## 2023-07-30 MED ORDER — ISOSORBIDE MONONITRATE ER 30 MG PO TB24
15.0000 mg | ORAL_TABLET | Freq: Every day | ORAL | Status: DC
  Administered 2023-07-30 – 2023-08-08 (×10): 15 mg via ORAL
  Filled 2023-07-30 (×10): qty 1

## 2023-07-30 MED ORDER — SODIUM CHLORIDE 0.9 % IV SOLN
2.0000 g | Freq: Two times a day (BID) | INTRAVENOUS | Status: AC
  Administered 2023-07-30 – 2023-08-05 (×13): 2 g via INTRAVENOUS
  Filled 2023-07-30 (×13): qty 12.5

## 2023-07-30 MED ORDER — AZITHROMYCIN 500 MG PO TABS: 500.0000 mg | ORAL_TABLET | Freq: Once | ORAL | Status: DC

## 2023-07-30 MED ORDER — INSULIN ASPART 100 UNIT/ML IJ SOLN
10.0000 [IU] | Freq: Once | INTRAMUSCULAR | Status: AC
  Administered 2023-07-30: 10 [IU] via SUBCUTANEOUS

## 2023-07-30 MED ORDER — SODIUM CHLORIDE 0.9% FLUSH
3.0000 mL | INTRAVENOUS | Status: DC
  Administered 2023-07-30 – 2023-08-12 (×48): 3 mL via INTRAVENOUS

## 2023-07-30 MED ORDER — SODIUM CHLORIDE 0.9% FLUSH: 10.0000 mL | INTRAVENOUS | Status: DC | PRN

## 2023-07-30 MED ORDER — AZITHROMYCIN 500 MG PO TABS
500.0000 mg | ORAL_TABLET | Freq: Every day | ORAL | Status: DC
  Administered 2023-07-30 – 2023-08-02 (×4): 500 mg via ORAL
  Filled 2023-07-30 (×4): qty 1

## 2023-07-30 MED ORDER — GUAIFENESIN ER 600 MG PO TB12
600.0000 mg | ORAL_TABLET | Freq: Two times a day (BID) | ORAL | Status: AC
  Administered 2023-07-30 – 2023-07-31 (×3): 600 mg via ORAL
  Filled 2023-07-30 (×3): qty 1

## 2023-07-30 MED ORDER — INSULIN GLARGINE-YFGN 100 UNIT/ML ~~LOC~~ SOLN
15.0000 [IU] | Freq: Every day | SUBCUTANEOUS | Status: DC
  Filled 2023-07-30: qty 0.15

## 2023-07-30 MED ORDER — ALBUTEROL SULFATE (2.5 MG/3ML) 0.083% IN NEBU
2.5000 mg | INHALATION_SOLUTION | Freq: Four times a day (QID) | RESPIRATORY_TRACT | Status: AC
  Administered 2023-07-30 – 2023-07-31 (×4): 2.5 mg via RESPIRATORY_TRACT
  Filled 2023-07-30 (×5): qty 3

## 2023-07-30 MED ORDER — TRAZODONE HCL 50 MG PO TABS: 25.0000 mg | ORAL_TABLET | Freq: Every day | ORAL | Status: DC

## 2023-07-30 NOTE — Progress Notes (Addendum)
 CXR reviewed> R lung better, left base worse versus different positioning explains seeing this infiltrate more.  No pneumothorax or significant effusion to explain acute worsening.   Anthony SHAUNNA Gaskins, DO 07/30/23 1:30 PM [facility]  For contact information, see Amion. If no response to pager, please call PCCM consult pager. After hours, 7PM- 7AM, please call Elink.     Checked back in after vest therapy. Still saturating about the same-- low 90s on HHF+ NRB, but off NRB back in the 80s. Per bedside sitter, this keeps happening when he dozes off.   BP (!) 161/80 (BP Location: Left Arm)   Pulse 78   Temp 98.7 F (37.1 C) (Oral)   Resp 19   Ht 5' 9 (1.753 m)   Wt 79.7 kg   SpO2 (!) 88%   BMI 25.95 kg/m  Faint rhonchi left base, minimal rhales bilaterally. No diffuse wheezing. Mild tachypnea but not using accessory muscles. Sitting forward in bed.   Trial of bipap to let him get some rest-- he agreed.  Anthony SHAUNNA Gaskins, DO 07/30/23 2:31 PM [facility]  For contact information, see Amion. If no response to pager, please call PCCM consult pager. After hours, 7PM- 7AM, please call Elink.

## 2023-07-30 NOTE — Plan of Care (Signed)
  Problem: Education: Goal: Ability to describe self-care measures that may prevent or decrease complications (Diabetes Survival Skills Education) will improve Outcome: Not Progressing Goal: Individualized Educational Video(s) Outcome: Not Progressing   Problem: Coping: Goal: Ability to adjust to condition or change in health will improve Outcome: Not Progressing   Problem: Fluid Volume: Goal: Ability to maintain a balanced intake and output will improve Outcome: Progressing   Problem: Health Behavior/Discharge Planning: Goal: Ability to identify and utilize available resources and services will improve Outcome: Not Progressing Goal: Ability to manage health-related needs will improve Outcome: Not Progressing   Problem: Metabolic: Goal: Ability to maintain appropriate glucose levels will improve Outcome: Not Progressing   Problem: Nutritional: Goal: Maintenance of adequate nutrition will improve Outcome: Progressing Goal: Progress toward achieving an optimal weight will improve Outcome: Progressing   Problem: Skin Integrity: Goal: Risk for impaired skin integrity will decrease Outcome: Not Progressing   Problem: Tissue Perfusion: Goal: Adequacy of tissue perfusion will improve Outcome: Not Progressing   Problem: Education: Goal: Knowledge of General Education information will improve Description: Including pain rating scale, medication(s)/side effects and non-pharmacologic comfort measures Outcome: Not Progressing

## 2023-07-30 NOTE — Plan of Care (Signed)

## 2023-07-30 NOTE — Progress Notes (Signed)
 Pt on 50L at 100% on the [facility] with the the non-rebreather at 100% pt still stating 82-83%. Nebulizer was given. Pt still stating at 82-83%. MD notified.

## 2023-07-30 NOTE — Progress Notes (Addendum)
 PROGRESS NOTE  Anthony [name].  FMW:[id number] DOB: May 10, 1950 DOA: 07/26/2023 PCP: No primary care provider on file.  Consultants  Addendum 07/30/2023 6:05 PM Came back by to see patient this evening.  Seated in bed, appears comfortable, no tachypnea/resp distress while wearing CPAP.  Denies any subjective dyspnea on my exam. Sats improved to 94% while I'm in room.  Sitter and nursing both report he at times drops down to the low 80s and then will spontaneously pop back up.  Patient hasn't had anything to drink since CPAP placed.  Note diuretics on board, starting very gentle IVF o/n x only 12 hours to ensure not dehydrated.    Brief Narrative: 73 y.o. male with a PMH significant for interstitial lung disease, COPD, COVID infection, coronary artery disease, diastolic heart failure, paroxysmal atrial fibrillation, DVT, peripheral artery disease s/p left above-knee amputation, diabetes mellitus type 2 and hyperlipidemia.  Patient is a skilled nursing facility resident.  Patient was admitted with altered mentation, acute on chronic respiratory failure, COPD with exacerbation, and multilobar pneumonia.  Patient's daughter, Cena Potters, who happens to be patient's power of attorney, has confirmed that patient is DO NOT RESUSCITATE.     Assessment & Plan:  Acute on chronic hypoxic respiratory failure: COPD with exacerbation/ILD: - Pulmonology consulted 7/21 due rising oxygen requirements. - This a.m. patient had sats continuing in the low 80s on 100% HHF.  He himself does not actually feel short of breath during these episodes. -Greatly appreciate pulmonology input.  Hypertonic saline nebs have been increased as well as with best therapy.  On steroids and antibiotics, GNR coverage broadened. - Plan for today is brief course of BiPAP and reevaluate.   Possible CHF with exacerbation: - IV Lasix  80 Mg every 8 hourly x 3 doses.   - Echo did not show any change in his ejection fraction although it was  a difficult acoustical windows study.   Multilobar pneumonia: - Follow final cultures. - Now on cefepime  for broad and gram-negative coverage -Repeat chest x-ray continues to show improvement. - Nasal swab for MRSA came back negative.  Respiratory viral panel came back negative.  DM2:   - History of the same. - We are chasing his CBGs as he is continuing on steroids. - Appreciate diabetes coordinator input.  AKI: - Secondary to initial illness. - Initially downtrending, now increasing again not taking PO. -Phosphorus minimally elevated.  Will watch.  Leukocytosis: - Remains elevated.  Pulmonology has broadened to cefepime  today. - Being treated for multilobar pneumonia as above.  Microcytic anemia: - Seems to be a new problem for him. - Last hemoglobin we have prior to this year was 2023 and his hemoglobin has typically been high at that time. - No acute evidence of blood loss.  Will check iron studies.   Acute encephalopathy, metabolic, cannot rule out toxic: - Resolved significantly.  Patient oriented on my exam today. - Likely multifactorial.   History of PAD: - Status post left AKA.    Goals of care:  -Patient tells me he does not have any family involved in his care -- in reality he does have a daughter who is medical POA - Looked tired on my exam this morning although a little bit better later - Consulted palliative care for further discussion regarding goals of care.  He is currently DNR/DNI.   DVT prophylaxis:   apixaban  (ELIQUIS ) tablet 5 mg  Code Status:   Code Status: Limited: Do not attempt resuscitation (DNR) -DNR-LIMITED -  Do Not Intubate/DNI  Level of care: Progressive Status is: Inpatient Remain in stepdown   Consults called: Pulmonology  Subjective: Patient sleeping on my exam.  Nursing staff in room as his sats were in the low 80s.  He easily awakened and could converse with me.  Occasionally pulled at his breathing mask.  Objective: Vitals:    07/30/23 0740 07/30/23 0806 07/30/23 1111 07/30/23 1404  BP:  119/75 (!) 161/80   Pulse:   78   Resp: 18 14 19    Temp:  98.7 F (37.1 C) 98.7 F (37.1 C)   TempSrc:  Oral Oral   SpO2:  92% (!) 89% (!) 88%  Weight:      Height:        Intake/Output Summary (Last 24 hours) at 07/30/2023 1442 Last data filed at 07/30/2023 1037 Gross per 24 hour  Intake 609.66 ml  Output 2750 ml  Net -2140.34 ml   Filed Weights   07/28/23 0252 07/29/23 0431 07/30/23 0355  Weight: 81 kg 79.5 kg 79.7 kg   Body mass index is 25.95 kg/m.  Gen: 73 y.o. male in no apparent distress.  Chronically ill-appearing and wearing oxygen mask Pulm: Non-labored breathing.  No accessory muscle use.  Some scattered wheezing throughout. CV: Regular rate and rhythm.  GI: Abdomen soft, non-tender, non-distended, with normoactive bowel sounds. No organomegaly or masses felt. Ext: Warm, no deformities, no pedal edema Skin: No rashes, lesions  Neuro: Alert.  No focal neurological deficits. Psych: Calm     I have personally reviewed the following labs and images: CBC: Recent Labs  Lab 07/26/23 0201 07/26/23 0210 07/27/23 0721 07/29/23 0234  WBC  --  21.8* 15.6* 17.5*  NEUTROABS  --  19.0* 14.6* 16.3*  HGB 11.2* 9.3* 9.9* 9.6*  HCT 33.0* 34.5* 35.3* 34.7*  MCV  --  72.9* 70.9* 70.2*  PLT  --  299 377 366   BMP &GFR Recent Labs  Lab 07/26/23 0201 07/26/23 0210 07/27/23 0721 07/29/23 0234 07/30/23 0228  NA 136 134* 139  139 137 138  K 4.8 4.8 4.4  4.4 4.5 3.9  CL  --  102 97*  98 101 98  CO2  --  21* 24  24 22 27   GLUCOSE  --  175* 172*  173* 249* 183*  BUN  --  24* 32*  31* 48* 47*  CREATININE  --  2.21* 1.46*  1.45* 1.21 1.30*  CALCIUM   --  9.0 9.0  9.1 9.3 9.5  MG  --   --  2.0  --  1.8  PHOS  --   --  4.0  4.1 3.6 4.9*   Estimated Creatinine Clearance: 49.9 mL/min (A) (by C-G formula based on SCr of 1.3 mg/dL (H)). Liver & Pancreas: Recent Labs  Lab 07/26/23 0210 07/27/23 0721  07/29/23 0234 07/30/23 0228  AST 23  --   --   --   ALT 14  --   --   --   ALKPHOS 57  --   --   --   BILITOT 0.9  --   --   --   PROT 7.2  --   --   --   ALBUMIN  3.3* 3.2* 3.2* 3.2*   No results for input(s): LIPASE, AMYLASE in the last 168 hours. No results for input(s): AMMONIA in the last 168 hours. Diabetic: No results for input(s): HGBA1C in the last 72 hours. Recent Labs  Lab 07/29/23 1129 07/29/23 1642 07/29/23 2127 07/30/23  9386 07/30/23 1109  GLUCAP 205* 303* 222* 244* 344*   Cardiac Enzymes: No results for input(s): CKTOTAL, CKMB, CKMBINDEX, TROPONINI in the last 168 hours. No results for input(s): PROBNP in the last 8760 hours. Coagulation Profile: No results for input(s): INR, PROTIME in the last 168 hours. Thyroid  Function Tests: No results for input(s): TSH, T4TOTAL, FREET4, T3FREE, THYROIDAB in the last 72 hours. Lipid Profile: No results for input(s): CHOL, HDL, LDLCALC, TRIG, CHOLHDL, LDLDIRECT in the last 72 hours. Anemia Panel: No results for input(s): VITAMINB12, FOLATE, FERRITIN, TIBC, IRON, RETICCTPCT in the last 72 hours. Urine analysis:    Component Value Date/Time   COLORURINE YELLOW 07/26/2023 0621   APPEARANCEUR HAZY (A) 07/26/2023 0621   LABSPEC 1.014 07/26/2023 0621   PHURINE 5.0 07/26/2023 0621   GLUCOSEU 50 (A) 07/26/2023 0621   HGBUR SMALL (A) 07/26/2023 0621   BILIRUBINUR NEGATIVE 07/26/2023 0621   KETONESUR NEGATIVE 07/26/2023 0621   PROTEINUR 30 (A) 07/26/2023 0621   UROBILINOGEN 0.2 05/05/2013 0633   NITRITE NEGATIVE 07/26/2023 0621   LEUKOCYTESUR NEGATIVE 07/26/2023 0621   Sepsis Labs: Invalid input(s): PROCALCITONIN, LACTICIDVEN  Microbiology: Recent Results (from the past 240 hours)  Resp panel by RT-PCR (RSV, Flu A&B, Covid) Anterior Nasal Swab     Status: None   Collection Time: 07/26/23  5:32 AM   Specimen: Anterior Nasal Swab  Result Value Ref Range Status    SARS Coronavirus 2 by RT PCR NEGATIVE NEGATIVE Final   Influenza A by PCR NEGATIVE NEGATIVE Final   Influenza B by PCR NEGATIVE NEGATIVE Final    Comment: (NOTE) The Xpert Xpress SARS-CoV-2/FLU/RSV plus assay is intended as an aid in the diagnosis of influenza from Nasopharyngeal swab specimens and should not be used as a sole basis for treatment. Nasal washings and aspirates are unacceptable for Xpert Xpress SARS-CoV-2/FLU/RSV testing.  Fact Sheet for Patients: BloggerCourse.com  Fact Sheet for Healthcare Providers: SeriousBroker.it  This test is not yet approved or cleared by the [place]  FDA and has been authorized for detection and/or diagnosis of SARS-CoV-2 by FDA under an Emergency Use Authorization (EUA). This EUA will remain in effect (meaning this test can be used) for the duration of the COVID-19 declaration under Section 564(b)(1) of the Act, 21 U.S.C. section 360bbb-3(b)(1), unless the authorization is terminated or revoked.     Resp Syncytial Virus by PCR NEGATIVE NEGATIVE Final    Comment: (NOTE) Fact Sheet for Patients: BloggerCourse.com  Fact Sheet for Healthcare Providers: SeriousBroker.it  This test is not yet approved or cleared by the [place]  FDA and has been authorized for detection and/or diagnosis of SARS-CoV-2 by FDA under an Emergency Use Authorization (EUA). This EUA will remain in effect (meaning this test can be used) for the duration of the COVID-19 declaration under Section 564(b)(1) of the Act, 21 U.S.C. section 360bbb-3(b)(1), unless the authorization is terminated or revoked.  Performed at [facility], [address] [place], [place] [address]   Culture, blood (Routine X 2) w Reflex to ID Panel     Status: None (Preliminary result)   Collection Time: 07/26/23  5:24 PM   Specimen: BLOOD LEFT HAND  Result Value Ref Range  Status   Specimen Description BLOOD LEFT HAND  Final   Special Requests   Final    BOTTLES DRAWN AEROBIC AND ANAEROBIC Blood Culture results may not be optimal due to an inadequate volume of blood received in culture bottles   Culture   Final  NO GROWTH 4 DAYS Performed at [facility], [address], [place], [place] [address]    Report Status PENDING  Incomplete  Culture, blood (Routine X 2) w Reflex to ID Panel     Status: None (Preliminary result)   Collection Time: 07/26/23  5:26 PM   Specimen: BLOOD RIGHT HAND  Result Value Ref Range Status   Specimen Description BLOOD RIGHT HAND  Final   Special Requests   Final    BOTTLES DRAWN AEROBIC AND ANAEROBIC Blood Culture results may not be optimal due to an inadequate volume of blood received in culture bottles   Culture   Final    NO GROWTH 4 DAYS Performed at [facility], [address] [place], [place] [address]    Report Status PENDING  Incomplete  Respiratory (~20 pathogens) panel by PCR     Status: None   Collection Time: 07/26/23  5:55 PM   Specimen: Nasopharyngeal Swab; Respiratory  Result Value Ref Range Status   Adenovirus NOT DETECTED NOT DETECTED Final   Coronavirus 229E NOT DETECTED NOT DETECTED Final    Comment: (NOTE) The Coronavirus on the Respiratory Panel, DOES NOT test for the novel  Coronavirus (2019 nCoV)    Coronavirus HKU1 NOT DETECTED NOT DETECTED Final   Coronavirus NL63 NOT DETECTED NOT DETECTED Final   Coronavirus OC43 NOT DETECTED NOT DETECTED Final   Metapneumovirus NOT DETECTED NOT DETECTED Final   Rhinovirus / Enterovirus NOT DETECTED NOT DETECTED Final   Influenza A NOT DETECTED NOT DETECTED Final   Influenza B NOT DETECTED NOT DETECTED Final   Parainfluenza Virus 1 NOT DETECTED NOT DETECTED Final   Parainfluenza Virus 2 NOT DETECTED NOT DETECTED Final   Parainfluenza Virus 3 NOT DETECTED NOT DETECTED Final   Parainfluenza Virus 4 NOT DETECTED NOT DETECTED Final   Respiratory  Syncytial Virus NOT DETECTED NOT DETECTED Final   Bordetella pertussis NOT DETECTED NOT DETECTED Final   Bordetella Parapertussis NOT DETECTED NOT DETECTED Final   Chlamydophila pneumoniae NOT DETECTED NOT DETECTED Final   Mycoplasma pneumoniae NOT DETECTED NOT DETECTED Final    Comment: Performed at [facility], [address], [place], [place] [address]  MRSA Next Gen by PCR, Nasal     Status: None   Collection Time: 07/27/23 12:40 AM   Specimen: Nasal Mucosa; Nasal Swab  Result Value Ref Range Status   MRSA by PCR Next Gen NOT DETECTED NOT DETECTED Final    Comment: (NOTE) The GeneXpert MRSA Assay (FDA approved for NASAL specimens only), is one component of a comprehensive MRSA colonization surveillance program. It is not intended to diagnose MRSA infection nor to guide or monitor treatment for MRSA infections. Test performance is not FDA approved in patients less than 23 years old. Performed at [facility], [address], [place], [place] [address]     Radiology Studies: DG CHEST PORT 1 VIEW Result Date: 07/30/2023 CLINICAL DATA:  [id number] Hypoxia [id number] EXAM: PORTABLE CHEST 1 VIEW COMPARISON:  Chest x-ray 07/29/2023, CT angiography 02/12/2021 FINDINGS: The heart and mediastinal contours are unchanged. Atherosclerotic plaque. Biapical pleural/pulmonary scarring. Persistent patchy airspace opacity of the bilateral, right greater than left, lower lobes. Chronic coarsened interstitial markings with no overt pulmonary edema. No pleural effusion. No pneumothorax. No acute osseous abnormality. IMPRESSION: 1. Persistent patchy airspace opacity of the bilateral, right greater than left, lower lobes. Question multifocal pneumonia. Followup PA and lateral chest X-ray is recommended in 3-4 weeks following therapy  to ensure resolution and exclude underlying malignancy. 2. Aortic Atherosclerosis (ICD10-I70.0) and Emphysema (ICD10-J43.9). Electronically Signed   By: Morgane  Naveau M.D.    On: 07/30/2023 13:32    Scheduled Meds:  albuterol   2.5 mg Nebulization Q6H   apixaban   5 mg Oral BID   arformoterol   15 mcg Nebulization BID   atorvastatin   40 mg Oral Daily   azithromycin   500 mg Oral Daily   budesonide  (PULMICORT ) nebulizer solution  0.5 mg Nebulization BID   escitalopram   10 mg Oral QHS   feeding supplement (GLUCERNA SHAKE)  237 mL Oral TID BM   ferrous sulfate   325 mg Oral Q breakfast   guaiFENesin   600 mg Oral BID   insulin  aspart  0-15 Units Subcutaneous TID WC   [START ON 07/31/2023] insulin  glargine-yfgn  15 Units Subcutaneous Daily   insulin  glargine-yfgn  5 Units Subcutaneous Once   isosorbide  mononitrate  15 mg Oral Daily   methylPREDNISolone  (SOLU-MEDROL ) injection  60 mg Intravenous Q12H   QUEtiapine   12.5 mg Oral QHS   revefenacin   175 mcg Nebulization Daily   sodium chloride  flush  3 mL Intravenous Q4H while awake   traZODone   225 mg Oral QHS   Continuous Infusions:  ceFEPime  (MAXIPIME ) IV       LOS: 4 days   55 minutes with more than 50% spent in reviewing records, counseling patient/family and coordinating care.  Reyes VEAR Gaw, MD [facility] [email or web address] 07/30/2023, 2:42 PM

## 2023-07-30 NOTE — Inpatient Diabetes Management (Signed)
 Inpatient Diabetes Program Recommendations  AACE/ADA: New Consensus Statement on Inpatient Glycemic Control   Target Ranges:  Prepandial:   less than 140 mg/dL      Peak postprandial:   less than 180 mg/dL (1-2 hours)      Critically ill patients:  140 - 180 mg/dL    Latest Reference Range & Units 07/29/23 06:30 07/29/23 11:29 07/29/23 16:42 07/29/23 21:27 07/30/23 06:13  Glucose-Capillary 70 - 99 mg/dL 755 (H) 794 (H) 696 (H) 222 (H) 244 (H)   Review of Glycemic Control  Diabetes history: DM2 Outpatient Diabetes medications: Glipizide  10 mg daily, Metformin  1000 mg BID, Rybelsus  7 mg daily Current orders for Inpatient glycemic control: Semglee  10 units daily, Novolog  0-15 units TID with meals; Solumedrol 60 mg Q12H  Inpatient Diabetes Program Recommendations:    Insulin : If steroids are continued as ordered, please consider increasing Semglee  to 12 units daily and ordering Novolog  3 units TID with meals for meal coverage if patient eats at least 50% of meals.  Thanks, Earnie Gainer, RN, MSN, CDCES Diabetes Coordinator Inpatient Diabetes Program [phone] (Team Pager from 8am to 5pm)

## 2023-07-30 NOTE — Progress Notes (Signed)
 Pt taken of HHFNC and placed back on documented setting on BIPAP for the night. CPT held due to pt being on BIPAP

## 2023-07-30 NOTE — Progress Notes (Signed)

## 2023-07-30 NOTE — Progress Notes (Signed)
 NAME:  Anthony [name]., MRN:  [id number], DOB:  1950-05-10, LOS: 4 ADMISSION DATE:  07/26/2023, CONSULTATION DATE:  07/29/23 REFERRING MD:  Rosario CHIEF COMPLAINT:  Hypoxia   History of Present Illness:  Anthony [name]. is a 73 y.o. male who has a PMH as below including but not limited to COPD on 3L O2, ILD, past COVID, reported CHF (though echo from 7/18 with normal EF, no diastolic dysfunction. Echo from 2023 similar), PAF on Apixaban , PAD s/p L AKA. He resides at [facility].  He was admitted 07/26/23 after a fall and was found to have dyspnea and hypoxia. He had CT chest that demonstrated known emphysema along with bilateral lower lobe and RML consolidations. RVP was negative. He was started on CTX and Azithromycin .  Since admission, he had increasing O2 requirements. Azithromycin  had been stopped after 3 days. On 7/21, he was up to 100% FiO2 and 45L/min HHFNC. PCCM was subsequently asked to see in consultation for any additional recommendations. He has been diuresed and is -4L since admit. BNP from 7/20 was 245. He has been on Solumedrol 80mg  q12hrs since admit.  Pertinent  Medical History:  has CAD (coronary artery disease); Hyperlipidemia associated with type 2 diabetes mellitus (HCC); COPD (chronic obstructive pulmonary disease) (HCC); Tobacco abuse; Mood disorder (HCC); Bilateral inguinal hernia; Umbilical hernia; S/P bilateral inguinal hernia repair; Chronic right hip pain; Recurrent left inguinal hernia; S/P right THA, AA; Moderate episode of recurrent major depressive disorder (HCC); Rectal bleeding; Obesity (BMI 30-39.9); DM2 (diabetes mellitus, type 2) (HCC); Pain medication agreement signed; Aortic atherosclerosis (HCC); Insomnia; Multiple lung nodules; Chronic fatigue; Generalized weakness; Vitamin D  deficiency; Elevated vitamin B12 level; Acute neutrophilia; Spinal stenosis of lumbar region with neurogenic claudication; Chronic constipation; Chronic pain of right knee;  Cerebellar 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  Significant Hospital Events: Including procedures, antibiotic start and stop dates in addition to other pertinent events   7/18 admit 7/21 up to 45L and 100% FiO2  Interim History / Subjective:  Desaturation episode this afternoon where his sats stayed in low 80s on 100% HHF. He did not feel more SOB; per RN looked the same. He required NRB + HHFNC with a neb to get sats back up. Now eating lunch on HHF.  Objective:  Blood pressure (!) 161/80, pulse 78, temperature 98.7 F (37.1 C), temperature source Oral, resp. rate 19, height 5' 9 (1.753 m), weight 79.7 kg, SpO2 (!) 89%.    FiO2 (%):  [90 %-100 %] 90 %   Intake/Output Summary (Last 24 hours) at 07/30/2023 1217 Last data filed at 07/30/2023 1037 Gross per 24 hour  Intake 609.66 ml  Output 2750 ml  Net -2140.34 ml   Filed Weights   07/28/23 0252 07/29/23 0431 07/30/23 0355  Weight: 81 kg 79.5 kg 79.7 kg    Physical Exam: General: chronically ill appearing man sitting up in bed in NAD Neuro: awake, alert, moving all extremities HEENT: [place]/AT, eyes anicteric Cardiovascular: S1S2, RRR Lungs: faint wheezing, some rhonchi on left. No accessory muscle use, mild tachypnea.  Abdomen:  nondistended Musculoskeletal: L AKA, no  significant RLE edema.   Labs reviewed.  Pneumococcal urinary antigen negative  Labs/imaging personally reviewed:  CT chest 7/18 > known emphysema along with bilateral lower lobe and RML  consolidations.  Assessment & Plan:   Acute on chronic hypoxic respiratory failure - multifactorial with known COPD (with exacerbation) and ILD and now superimposed bilateral PNA. Also possibly mild pulmonary edema though echo reassuring. Highly doubt PE as he is on Apixaban  and resides at SNF so unlikely to have missed doses. Worry about shunting if he has atelectasis or mucus plugging from immobility & pneumonia.  -STAT CXR -Increase hypertonic saline nebs to q4hWA + flutter valve. Will add vest therapy since he has had another episode this afternoon.  -con't brovana  & yupelri  -con't steroids and antibiotics> will broaden GNR coverage since he isn't responding -con't supplemental O2 -pulmonary hygiene is hard for him with limited mobility -titrate O2 as required to maintain SpO2 >90%   D/w bedside RN. Called RT to come to bedside.  DNR/DNI-- appropriate   Rest of care per primary team.  Best practice (evaluated daily):  Per primary team.  Labs   CBC: Recent Labs  Lab 07/26/23 0201 07/26/23 0210 07/27/23 0721 07/29/23 0234  WBC  --  21.8* 15.6* 17.5*  NEUTROABS  --  19.0* 14.6* 16.3*  HGB 11.2* 9.3* 9.9* 9.6*  HCT 33.0* 34.5* 35.3* 34.7*  MCV  --  72.9* 70.9* 70.2*  PLT  --  299 377 366    Basic Metabolic Panel: Recent Labs  Lab 07/26/23 0201 07/26/23 0210 07/27/23 0721 07/29/23 0234 07/30/23 0228  NA 136 134* 139  139 137 138  K 4.8 4.8 4.4  4.4 4.5 3.9  CL  --  102 97*  98 101 98  CO2  --  21* 24  24 22 27   GLUCOSE  --  175* 172*  173* 249* 183*  BUN  --  24* 32*  31* 48* 47*  CREATININE  --  2.21* 1.46*  1.45* 1.21 1.30*  CALCIUM   --  9.0 9.0  9.1 9.3 9.5  MG  --   --  2.0  --  1.8  PHOS  --   --  4.0  4.1 3.6 4.9*   GFR: Estimated Creatinine Clearance: 49.9 mL/min (A) (by C-G formula based on SCr of 1.3 mg/dL (H)). Recent Labs  Lab 07/26/23 0210 07/27/23 0721 07/29/23 0234  PROCALCITON  --  1.25  --   WBC 21.8* 15.6* 17.5*    Liver  Function Tests: Recent Labs  Lab 07/26/23 0210 07/27/23 0721 07/29/23 0234 07/30/23 0228  AST 23  --   --   --   ALT 14  --   --   --   ALKPHOS 57  --   --   --   BILITOT 0.9  --   --   --   PROT 7.2  --   --   --   ALBUMIN  3.3* 3.2* 3.2* 3.2*    ABG    Component Value Date/Time   PHART 7.41 07/29/2023 0031   PCO2ART 41 07/29/2023 0031   PO2ART 64 (L) 07/29/2023 0031   HCO3 26.0 07/29/2023 0031   TCO2 24 07/26/2023 0201   ACIDBASEDEF 2.0 07/26/2023 0201   O2SAT 92.3 07/29/2023 0031      Leita SHAUNNA Gaskins, DO 07/30/23 1:16 PM [facility]  For contact information, see Amion. If no response to pager, please call PCCM consult pager. After hours, 7PM- 7AM, please  call Elink.

## 2023-07-30 NOTE — Progress Notes (Signed)
 Nurse wanted patient removed from bipap and placed on heated high flow cannula so patient could eat.

## 2023-07-31 DIAGNOSIS — J441 Chronic obstructive pulmonary disease with (acute) exacerbation: Secondary | ICD-10-CM | POA: Diagnosis not present

## 2023-07-31 DIAGNOSIS — J189 Pneumonia, unspecified organism: Secondary | ICD-10-CM | POA: Diagnosis not present

## 2023-07-31 DIAGNOSIS — Z515 Encounter for palliative care: Secondary | ICD-10-CM

## 2023-07-31 DIAGNOSIS — Z7189 Other specified counseling: Secondary | ICD-10-CM | POA: Diagnosis not present

## 2023-07-31 DIAGNOSIS — J849 Interstitial pulmonary disease, unspecified: Secondary | ICD-10-CM | POA: Diagnosis not present

## 2023-07-31 DIAGNOSIS — J449 Chronic obstructive pulmonary disease, unspecified: Secondary | ICD-10-CM

## 2023-07-31 DIAGNOSIS — J9621 Acute and chronic respiratory failure with hypoxia: Secondary | ICD-10-CM | POA: Diagnosis not present

## 2023-07-31 DIAGNOSIS — J9601 Acute respiratory failure with hypoxia: Secondary | ICD-10-CM | POA: Diagnosis not present

## 2023-07-31 LAB — BASIC METABOLIC PANEL WITH GFR
Anion gap: 9 (ref 5–15)
BUN: 52 mg/dL — ABNORMAL HIGH (ref 8–23)
CO2: 27 mmol/L (ref 22–32)
Calcium: 8.9 mg/dL (ref 8.9–10.3)
Chloride: 98 mmol/L (ref 98–111)
Creatinine, Ser: 1.2 mg/dL (ref 0.61–1.24)
GFR, Estimated: >60 mL/min (ref >60)
Glucose, Bld: 280 mg/dL — ABNORMAL HIGH (ref 70–99)
Potassium: 4.5 mmol/L (ref 3.5–5.1)
Sodium: 134 mmol/L — ABNORMAL LOW (ref 135–145)

## 2023-07-31 LAB — CULTURE, BLOOD (ROUTINE X 2)
Culture: NO GROWTH
Culture: NO GROWTH

## 2023-07-31 LAB — GLUCOSE, CAPILLARY
Glucose-Capillary: 292 mg/dL — ABNORMAL HIGH (ref 70–99)
Glucose-Capillary: 312 mg/dL — ABNORMAL HIGH (ref 70–99)
Glucose-Capillary: 370 mg/dL — ABNORMAL HIGH (ref 70–99)
Glucose-Capillary: 273 mg/dL — ABNORMAL HIGH (ref 70–99)

## 2023-07-31 MED ORDER — ALBUTEROL SULFATE (2.5 MG/3ML) 0.083% IN NEBU
2.5000 mg | INHALATION_SOLUTION | RESPIRATORY_TRACT | Status: DC | PRN
  Administered 2023-07-31 – 2023-08-03 (×2): 2.5 mg via RESPIRATORY_TRACT
  Filled 2023-07-31: qty 3

## 2023-07-31 MED ORDER — MORPHINE SULFATE (PF) 2 MG/ML IV SOLN: 1.0000 mg | INTRAVENOUS | Status: DC | PRN

## 2023-07-31 MED ORDER — INSULIN GLARGINE-YFGN 100 UNIT/ML ~~LOC~~ SOLN
25.0000 [IU] | Freq: Every day | SUBCUTANEOUS | Status: DC
  Administered 2023-08-01 – 2023-08-02 (×2): 25 [IU] via SUBCUTANEOUS
  Filled 2023-07-31 (×2): qty 0.25

## 2023-07-31 MED ORDER — INSULIN GLARGINE-YFGN 100 UNIT/ML ~~LOC~~ SOLN
20.0000 [IU] | Freq: Every day | SUBCUTANEOUS | Status: DC
  Administered 2023-07-31: 20 [IU] via SUBCUTANEOUS
  Filled 2023-07-31: qty 0.2

## 2023-07-31 MED ORDER — METHYLPREDNISOLONE SODIUM SUCC 125 MG IJ SOLR
80.0000 mg | Freq: Two times a day (BID) | INTRAMUSCULAR | Status: DC
  Administered 2023-07-31 – 2023-08-05 (×11): 80 mg via INTRAVENOUS
  Filled 2023-07-31 (×11): qty 2

## 2023-07-31 NOTE — Progress Notes (Signed)
 Physical Therapy Treatment Patient Details Name: Anthony [name]. MRN: [id number] DOB: 12/19/1950 Today's Date: 07/31/2023   History of Present Illness 73 y.o. male presents to [facility] 07/26/23 from [facility] after sliding off the bed with AMS and hypoxia. Admitted with acute hypoxic respiratory failure, PNA, and acute encephalopathy. PMHx: Dementia, ILD, COPD, CAD with hx inferior STEMI, RCA PCI, diastolic HF, paroxysmal A-fib, DVT, on anticoagulation, PAD with L AKA. diabetes type 2, hyperlipidemia.    PT Comments  Pt received in supine, lethargic and leaning toward L side of bed with HOB >40 degrees. Pt with HHFNC slid forward outside of his nares and PTA arrived and adjusted mask to fit more snugly on his face to ensure [place] inside his nose, and turned back telemetry/monitoring, with SpO2 noted to be 74% when PTA arrived. After cues for pursed-lip breathing and SpO2 pleth sensor readjustment, pt SpO2 improving to ~82% on 40L/min and FiO2 80%, RN present and assisted PTA to position pt more in midline posture with HOB fully elevated to promote improved pulmonary clearance, with SpO2 further improvement to 85% and above once RN placed NRB mask in addition to HHFNC. HR elevated per monitor. Pt following some simple motor commands on LLE and to assist with repositioning but remains groggy/impulsive and often needing NRB mask adjusted to keep pt from pulling it off. Pt pleasantly cooperative this date; PTA defer EOB/OOB due to pt poor respiratory status at time of session. Pt continues to benefit from PT services to progress toward functional mobility goals.     If plan is discharge home, recommend the following: A lot of help with bathing/dressing/bathroom;Assistance with cooking/housework;Assist for transportation;Help with stairs or ramp for entrance;Two people to help with walking and/or transfers;Supervision due to cognitive status   Can travel by private vehicle     No  Equipment  Recommendations  None recommended by PT (LT facility has needed DME per chart review)    Recommendations for Other Services       Precautions / Restrictions Precautions Precautions: Fall Recall of Precautions/Restrictions: Impaired Precaution/Restrictions Comments: watch O2; impulsive and tends to remove [place] Restrictions Weight Bearing Restrictions Per Provider Order: No     Mobility  Bed Mobility Overal bed mobility: Needs Assistance             General bed mobility comments: minA to reposition trunk more to midline of bed with +2 assist via HHA as pt had been slumped toward his L side in high fowlers position when PTA arrived. Defer EOB/OOB due to pt hypoxia continuing despite repositioning, cues for pursed-lip breathing and NRB placement by RN.    Transfers Overall transfer level: Needs assistance                 General transfer comment: defer due to poor respiratory status at this time    Ambulation/Gait                   Stairs             Wheelchair Mobility     Tilt Bed    Modified Rankin (Stroke Patients Only)       Balance Overall balance assessment: Needs assistance, Mild deficits observed, not formally tested, History of Falls Sitting-balance support: Bilateral upper extremity supported, Feet supported Sitting balance-Leahy Scale: Poor Sitting balance - Comments: drowsy; limited assessment for long sit and defer EOB due to hypoxia on HHFNC today       Standing balance comment: defer  due to poor respiratory status today                            Communication Communication Communication: Impaired;Other (comment) Factors Affecting Communication: Difficulty expressing self;Other (comment);Hearing impaired (drowsy)  Cognition Arousal: Lethargic Behavior During Therapy: Impulsive   PT - Cognitive impairments: History of cognitive impairments, Problem solving, Safety/Judgement, Attention                        PT - Cognition Comments: Pt received in bed chair posture HHFNC forward outside of nares, mask adjusted and PTA pressed touch screen to reactivate heart monitor/telemetry monitoring as it had been on stand-by. Pt appears stuporous but once HHFNC adjusted pt awakens more and makes effort to follow commands, but still appears drowsy, with pulse oximeter on finger and ear reading hypoxia with SpO2 <80% on HHFNC. Pt impulsive and when RN placed NRB mask to further improve sats, pt removed it multiple times to take sips of beverage from his tray, but tolerates PTA replacing mask and tightening it without complaint. Following commands: Impaired Following commands impaired: Follows one step commands with increased time, Only follows one step commands consistently (poor carryover of instruction to leave NRB mask in place after 1-2 minutes pt attempts to move mask to drink his beverage)    Cueing Cueing Techniques: Verbal cues, Gestural cues, Tactile cues  Exercises Other Exercises Other Exercises: bed chair posture: LLE ankle pumps and LAQ x10 reps with cues for technique/safety and slow, deep breaths, pt had been restless and pulling off NRB but while moving LLE, pt less restless. Other Exercises: cues for pursed-lip breathing throughout session, pt performing at times then needs redirection to task when pt impulsive to remove NRB mask.    General Comments General comments (skin integrity, edema, etc.): pt appears clammy and drowsy, less alert than in previous session; slightly restless; repositioned more toward midline posture with HOB fully elevated to promote improved pulmonary clearance; RN called to room to assist as pt hypoxic when PTA arrived to room with HHFNC mask slid down out of nares and monitor had been on stand-by when PTA arrived, once mask adjusted monitor turned on and reading SpO2 74%, slowly improving to >82% after mask adjusted, so RN placed NRB and SpO2 improved to 85-90%, with  inconsistent pleth signal on R earlobe and fingers of L hand despite warm feeling fingertips/R earlobe. HR also elevated >100 bpm and RR elevated. Pillow folded under LUE to promote pt to maintain midline posture as he tends to lean more L at he fatigues.      Pertinent Vitals/Pain Pain Assessment Pain Assessment: No/denies pain Breathing: occasional labored breathing, short period of hyperventilation Negative Vocalization: none Facial Expression: smiling or inexpressive Body Language: relaxed Consolability: no need to console PAINAD Score: 1 Pain Intervention(s): Limited activity within patient's tolerance, Monitored during session, Repositioned, Utilized relaxation techniques    Home Living                          Prior Function            PT Goals (current goals can now be found in the care plan section) Acute Rehab PT Goals Patient Stated Goal: to feel better PT Goal Formulation: With patient Time For Goal Achievement: 08/09/23 Progress towards PT goals: Progressing toward goals    Frequency    Min 2X/week  PT Plan      Co-evaluation              AM-PAC PT 6 Clicks Mobility   Outcome Measure  Help needed turning from your back to your side while in a flat bed without using bedrails?: A Little Help needed moving from lying on your back to sitting on the side of a flat bed without using bedrails?: A Lot Help needed moving to and from a bed to a chair (including a wheelchair)?: A Lot Help needed standing up from a chair using your arms (e.g., wheelchair or bedside chair)?: Total (due to resp status today) Help needed to walk in hospital room?: Total Help needed climbing 3-5 steps with a railing? : Total 6 Click Score: 10    End of Session Equipment Utilized During Treatment: Oxygen (HHFNC and RN brought NRB and placed it) Activity Tolerance: Treatment limited secondary to medical complications (Comment);Other (comment);Patient limited by  lethargy (hypoxia when PTA arrived to room, improving but still reading ~85% when PTA left and RN present to assess pt after PTA assisted to adjust his posture and ensure he maintains masks) Patient left: in bed;with call bell/phone within reach;with bed alarm set;Other (comment) (bed in chair posture with HOB elevated >50 degrees, bed alarm placed as it was not on when PTA arrived to his room) Nurse Communication: Mobility status;Precautions;Other (comment) (SpO2/HR, monitor was off when PTA arrived, pt had let his HHFNC fall out of his nose) PT Visit Diagnosis: Other abnormalities of gait and mobility (R26.89);Muscle weakness (generalized) (M62.81);History of falling (Z91.81)     Time: 1214-1226 PT Time Calculation (min) (ACUTE ONLY): 12 min  Charges:    $Therapeutic Activity: 8-22 mins PT General Charges $$ ACUTE PT VISIT: 1 Visit                     Rasheema Truluck P., PTA Acute Rehabilitation Services Secure Chat Preferred 9a-5:30pm Office: [phone]    Connell HERO [facility] 07/31/2023, 2:22 PM

## 2023-07-31 NOTE — Consult Note (Signed)
 Palliative Care Consult Note                                  Date: 07/31/2023   Patient Name: Anthony [name].  DOB: 1950/11/07  MRN: [id number]  Age / Sex: 73 y.o., male  PCP: No primary care provider on file. Referring Physician: Elpidio Reyes DEL, MD  Reason for Consultation: Establishing goals of care  HPI/Patient Profile: 73 y.o. male  with past medical history of ILD, COPD, chronic respiratory failure on 3 L O2, prior COVID infection, coronary artery disease, reported CHF (though prior echo with normal EF and no diastolic dysfunction), paroxysmal atrial fibrillation, DVT, peripheral artery disease s/p left AKA, diabetes type 2, and hyperlipidemia.  He presented to the ED on 07/26/2023 after a fall and was found to have dyspnea and hypoxia.  CT chest showed known emphysema along with bilateral lower lobe and RML consolidations.  He is admitted with acute on chronic hypoxic respiratory failure; multifactorial due to COPD exacerbation and ILD along with superimposed bilateral pneumonia.  Since admission, he has had increasing O2 requirements.  Palliative Medicine has been consulted for goals of care discussions. Patient and family are faced with anticipatory care needs and complex medical decision making.   Clinical Assessment and Goals of Care:   Extensive chart review has been completed including labs, vital signs, imaging, progress/consult notes, orders, medications and available advance directive documents.    Update received from RN.  Patient assessed at bedside. He is on HFNC at 50 L. He was initially sleepy/drowsy, but awakened easily and was able to answer questions. He stated he feels ok and denied acute complaints. However, he does not know why he is in the hospital and is unable to participate in meaningful conversation.   I later spoke with his daughter/[name] by phone to discuss diagnosis, prognosis, and GOC.  I introduced  Palliative Medicine as specialized medical care for people living with serious illness. It focuses on providing relief from the symptoms and stress of a serious illness.   Created space and opportunity for patient and family to express thoughts and feelings regarding current medical situation. Values and goals of care were attempted to be elicited.  Life Review: Patient is divorced. He has 2 daughters.  [name] lives in [place], while the other daughter lives in [place].  Functional Status: Daughter reports patient's functional status and overall health has declined since his AKA in September 2022.  He lived in a SNF since around this time.  At baseline, he is ambulatory with assistance but spends much time in a wheelchair.  Daughter also reports patient has progressive cognitive impairment, and she suspects he has dementia.  Discussion: We discussed patient's current illness and what it means in the larger context of his ongoing co-morbidities. Current clinical status was reviewed.  Discussed the natural trajectory of COPD/ILD as progressive and noncurable.  Cena shares that she is an [employer] fund[employer].  She understands that her father's overall health has been declining over the past few years.  Discussed different scopes of care - full scope versus limited interventions (treat treatable) versus comfort care. Daughter indicates her preference to continue supportive care with hope for recovery to the extent this is possible.  However, if patient declines or is not improving as desired, she seems open to transition to more comfort focused care.  For now, she is open to low-dose opioids to help with  patient's dyspnea/air hunger.  I discussed with daughter my recommendation to consider the addition of hospice support at patient's SNF. I explained that hospice would be extra care for patient, communication with family, and assistance with symptom management and care when he further declines. Daughter agrees  with hospice referral.   Discussed the importance of continued conversation with the medical team regarding overall plan of care and treatment options. Questions and concerns addressed.  Emotional support provided.   Review of Systems  Respiratory:  Negative for shortness of breath.     Objective:   Primary Diagnoses: Present on Admission: **None**   Physical Exam Vitals reviewed.  Constitutional:      General: He is not in acute distress. Cardiovascular:     Rate and Rhythm: Bradycardia present.  Pulmonary:     Effort: No accessory muscle usage or respiratory distress.  Musculoskeletal:     Left Lower Extremity: Left leg is amputated above knee.  Neurological:     Mental Status: He is alert.  Psychiatric:        Cognition and Memory: Memory is impaired.      Palliative Assessment/Data: PPS 40%     Assessment & Plan:   SUMMARY OF RECOMMENDATIONS   Continue current scope of care Goal of care is medical optimization and return to SNF when medically stable Daughter agrees to hospice support at SNF Ongoing palliative support  Primary Decision Maker: NEXT OF KIN - Daughter/[name]  Existing Vynca/ACP Documentation: None  Code Status/Advance Care Planning: DNR - Limited  Symptom Management:  Morphine  1-2 mg IV every 4 hours as needed for pain or shortness of breath  Prognosis:  < 6 months  Discharge Planning:  To Be Determined   Discussed with: Dr. Elpidio and RN   Thank you for allowing us  to participate in the care of Anthony [name].   Time Total: 78 minutes  Detailed review of medical records (labs, imaging, vital signs), medically appropriate exam, discussed with treatment team, counseling and education to patient, family, & staff, documenting clinical information, medication management, coordination of care.   Signed by: Recardo Loll, NP Palliative Medicine Team  Team Phone # [phone]  For individual providers, please see  AMION

## 2023-07-31 NOTE — Progress Notes (Signed)
 PROGRESS NOTE  Anthony [name].  FMW:[id number] DOB: 03/03/1950 DOA: 07/26/2023 PCP: No primary care provider on file.  Consultants   Brief Narrative: 73 y.o. male with a PMH significant for interstitial lung disease, COPD, COVID infection, coronary artery disease, diastolic heart failure, paroxysmal atrial fibrillation, DVT, peripheral artery disease s/p left above-knee amputation, diabetes mellitus type 2 and hyperlipidemia.  Patient is a skilled nursing facility resident.  Patient was admitted with altered mentation, acute on chronic respiratory failure, COPD with exacerbation, and multilobar pneumonia.  Patient's daughter, Anthony [name], who happens to be patient's power of attorney, has confirmed that patient is DNR   Assessment & Plan:  Acute on chronic hypoxic respiratory failure: COPD with exacerbation/ILD: - Pulmonology consulted 7/21 due rising oxygen requirements. - BiPAP o/n, moved to [facility] this AM.  Once feeling groggy and could not remember the names of his daughters nor the fact he was in the hospital. -Came back to check on him this afternoon he was much more awake and alert.  Still wearing high flow -Greatly appreciate pulmonology input.  Hypertonic saline nebs have been increased as well as with best therapy.  On steroids and antibiotics, GNR coverage broadened.  Overall he has a lot of benefits on oxygen. - Doing much better today maintaining in the mid 90s   Possible CHF with exacerbation: - IV Lasix  80 Mg every 8 hourly x 3 doses.  Now appears euvolemic - Echo did not show any change in his ejection fraction although it was a difficult acoustical windows study.   Multilobar pneumonia: - Follow final cultures. - Now on cefepime  for broad and gram-negative coverage -Repeat chest x-ray continues to show improvement. - Nasal swab for MRSA came back negative.  Respiratory viral panel came back negative.  DM2:   - History of the same. - We are chasing his CBGs as he is  continuing on steroids. - Appreciate diabetes coordinator input. - Increasing Semglee   AKI: - Secondary to initial illness. - Continues to downtrend. -Phosphorus minimally elevated.  Will recheck.  Leukocytosis: - Remains elevated.  Pulmonology has broadened to cefepime  today. - Being treated for multilobar pneumonia as above.  Microcytic anemia: - Seems to be a new problem for him. - Last hemoglobin we have prior to this year was 2023 and his hemoglobin has typically been high at that time. - No acute evidence of blood loss.  Will check iron studies.   Acute encephalopathy, metabolic, cannot rule out toxic: - Resolved significantly.  Patient oriented on my exam today. - Likely multifactorial.   History of PAD: - Status post left AKA.    Goals of care:  -Patient tells me he does not have any family involved in his care -- in reality he does have a daughter who is medical POA - Consulted palliative care for further discussion regarding goals of care.  He is currently DNR/DNI.   DVT prophylaxis:   apixaban  (ELIQUIS ) tablet 5 mg  Code Status:   Code Status: Limited: Do not attempt resuscitation (DNR) -DNR-LIMITED -Do Not Intubate/DNI  Level of care: Progressive Status is: Inpatient Remain in stepdown   Consults called: Pulmonology  Subjective: Patient groggy this a.m. and much more confused than yesterday.  On my examination this afternoon he was much more awake and knew he was in the hospital and could converse about his daughters  Objective: Vitals:   07/31/23 1124 07/31/23 1220 07/31/23 1226 07/31/23 1548  BP: (!) 119/54   119/80  Pulse: 67   (!)  108  Resp: 20   (!) 27  Temp: 98.1 F (36.7 C)   (!) 97.3 F (36.3 C)  TempSrc: Oral   Axillary  SpO2: (!) 89% (!) 76% (!) 85% 93%  Weight:      Height:        Intake/Output Summary (Last 24 hours) at 07/31/2023 1706 Last data filed at 07/31/2023 1627 Gross per 24 hour  Intake 1557.01 ml  Output 700 ml  Net 857.01  ml   Filed Weights   07/29/23 0431 07/30/23 0355 07/31/23 0323  Weight: 79.5 kg 79.7 kg 82.3 kg   Body mass index is 26.79 kg/m.  Gen: 73 y.o. male in no apparent distress.  Chronically ill-appearing and wearing oxygen mask Pulm: Non-labored breathing.  No accessory muscle use.  Some scattered wheezing throughout. CV: Regular rate and rhythm.  GI: Abdomen soft, non-tender, non-distended Ext: Warm, no deformities, no pedal edema Skin: No rashes, lesions  Neuro: Alert.  No focal neurological deficits. Psych: Calm     I have personally reviewed the following labs and images: CBC: Recent Labs  Lab 07/26/23 0201 07/26/23 0210 07/27/23 0721 07/29/23 0234  WBC  --  21.8* 15.6* 17.5*  NEUTROABS  --  19.0* 14.6* 16.3*  HGB 11.2* 9.3* 9.9* 9.6*  HCT 33.0* 34.5* 35.3* 34.7*  MCV  --  72.9* 70.9* 70.2*  PLT  --  299 377 366   BMP &GFR Recent Labs  Lab 07/26/23 0210 07/27/23 0721 07/29/23 0234 07/30/23 0228 07/31/23 0742  NA 134* 139  139 137 138 134*  K 4.8 4.4  4.4 4.5 3.9 4.5  CL 102 97*  98 101 98 98  CO2 21* 24  24 22 27 27   GLUCOSE 175* 172*  173* 249* 183* 280*  BUN 24* 32*  31* 48* 47* 52*  CREATININE 2.21* 1.46*  1.45* 1.21 1.30* 1.20  CALCIUM  9.0 9.0  9.1 9.3 9.5 8.9  MG  --  2.0  --  1.8  --   PHOS  --  4.0  4.1 3.6 4.9*  --    Estimated Creatinine Clearance: 54 mL/min (by C-G formula based on SCr of 1.2 mg/dL). Liver & Pancreas: Recent Labs  Lab 07/26/23 0210 07/27/23 0721 07/29/23 0234 07/30/23 0228  AST 23  --   --   --   ALT 14  --   --   --   ALKPHOS 57  --   --   --   BILITOT 0.9  --   --   --   PROT 7.2  --   --   --   ALBUMIN  3.3* 3.2* 3.2* 3.2*   No results for input(s): LIPASE, AMYLASE in the last 168 hours. No results for input(s): AMMONIA in the last 168 hours. Diabetic: No results for input(s): HGBA1C in the last 72 hours. Recent Labs  Lab 07/30/23 1818 07/30/23 2053 07/31/23 0619 07/31/23 1158 07/31/23 1620   GLUCAP 273* 258* 292* 312* 370*   Cardiac Enzymes: No results for input(s): CKTOTAL, CKMB, CKMBINDEX, TROPONINI in the last 168 hours. No results for input(s): PROBNP in the last 8760 hours. Coagulation Profile: No results for input(s): INR, PROTIME in the last 168 hours. Thyroid  Function Tests: No results for input(s): TSH, T4TOTAL, FREET4, T3FREE, THYROIDAB in the last 72 hours. Lipid Profile: No results for input(s): CHOL, HDL, LDLCALC, TRIG, CHOLHDL, LDLDIRECT in the last 72 hours. Anemia Panel: No results for input(s): VITAMINB12, FOLATE, FERRITIN, TIBC, IRON, RETICCTPCT in the last 72  hours. Urine analysis:    Component Value Date/Time   COLORURINE YELLOW 07/26/2023 0621   APPEARANCEUR HAZY (A) 07/26/2023 0621   LABSPEC 1.014 07/26/2023 0621   PHURINE 5.0 07/26/2023 0621   GLUCOSEU 50 (A) 07/26/2023 0621   HGBUR SMALL (A) 07/26/2023 0621   BILIRUBINUR NEGATIVE 07/26/2023 0621   KETONESUR NEGATIVE 07/26/2023 0621   PROTEINUR 30 (A) 07/26/2023 0621   UROBILINOGEN 0.2 05/05/2013 0633   NITRITE NEGATIVE 07/26/2023 0621   LEUKOCYTESUR NEGATIVE 07/26/2023 0621   Sepsis Labs: Invalid input(s): PROCALCITONIN, LACTICIDVEN  Microbiology: Recent Results (from the past 240 hours)  Resp panel by RT-PCR (RSV, Flu A&B, Covid) Anterior Nasal Swab     Status: None   Collection Time: 07/26/23  5:32 AM   Specimen: Anterior Nasal Swab  Result Value Ref Range Status   SARS Coronavirus 2 by RT PCR NEGATIVE NEGATIVE Final   Influenza A by PCR NEGATIVE NEGATIVE Final   Influenza B by PCR NEGATIVE NEGATIVE Final    Comment: (NOTE) The Xpert Xpress SARS-CoV-2/FLU/RSV plus assay is intended as an aid in the diagnosis of influenza from Nasopharyngeal swab specimens and should not be used as a sole basis for treatment. Nasal washings and aspirates are unacceptable for Xpert Xpress SARS-CoV-2/FLU/RSV testing.  Fact Sheet for  Patients: BloggerCourse.com  Fact Sheet for Healthcare Providers: SeriousBroker.it  This test is not yet approved or cleared by the [place]  FDA and has been authorized for detection and/or diagnosis of SARS-CoV-2 by FDA under an Emergency Use Authorization (EUA). This EUA will remain in effect (meaning this test can be used) for the duration of the COVID-19 declaration under Section 564(b)(1) of the Act, 21 U.S.C. section 360bbb-3(b)(1), unless the authorization is terminated or revoked.     Resp Syncytial Virus by PCR NEGATIVE NEGATIVE Final    Comment: (NOTE) Fact Sheet for Patients: BloggerCourse.com  Fact Sheet for Healthcare Providers: SeriousBroker.it  This test is not yet approved or cleared by the [place]  FDA and has been authorized for detection and/or diagnosis of SARS-CoV-2 by FDA under an Emergency Use Authorization (EUA). This EUA will remain in effect (meaning this test can be used) for the duration of the COVID-19 declaration under Section 564(b)(1) of the Act, 21 U.S.C. section 360bbb-3(b)(1), unless the authorization is terminated or revoked.  Performed at [facility], [address] [place], [place] [address]   Culture, blood (Routine X 2) w Reflex to ID Panel     Status: None   Collection Time: 07/26/23  5:24 PM   Specimen: BLOOD LEFT HAND  Result Value Ref Range Status   Specimen Description BLOOD LEFT HAND  Final   Special Requests   Final    BOTTLES DRAWN AEROBIC AND ANAEROBIC Blood Culture results may not be optimal due to an inadequate volume of blood received in culture bottles   Culture   Final    NO GROWTH 5 DAYS Performed at [facility], [address], [place], [place] [address]    Report Status 07/31/2023 FINAL  Final  Culture, blood (Routine X 2) w Reflex to ID Panel     Status: None   Collection Time: 07/26/23  5:26  PM   Specimen: BLOOD RIGHT HAND  Result Value Ref Range Status   Specimen Description BLOOD RIGHT HAND  Final   Special Requests   Final    BOTTLES DRAWN AEROBIC AND ANAEROBIC Blood Culture results may not be optimal due to an inadequate volume of blood  received in culture bottles   Culture   Final    NO GROWTH 5 DAYS Performed at [facility], [address], [place], [place] [address]    Report Status 07/31/2023 FINAL  Final  Respiratory (~20 pathogens) panel by PCR     Status: None   Collection Time: 07/26/23  5:55 PM   Specimen: Nasopharyngeal Swab; Respiratory  Result Value Ref Range Status   Adenovirus NOT DETECTED NOT DETECTED Final   Coronavirus 229E NOT DETECTED NOT DETECTED Final    Comment: (NOTE) The Coronavirus on the Respiratory Panel, DOES NOT test for the novel  Coronavirus (2019 nCoV)    Coronavirus HKU1 NOT DETECTED NOT DETECTED Final   Coronavirus NL63 NOT DETECTED NOT DETECTED Final   Coronavirus OC43 NOT DETECTED NOT DETECTED Final   Metapneumovirus NOT DETECTED NOT DETECTED Final   Rhinovirus / Enterovirus NOT DETECTED NOT DETECTED Final   Influenza A NOT DETECTED NOT DETECTED Final   Influenza B NOT DETECTED NOT DETECTED Final   Parainfluenza Virus 1 NOT DETECTED NOT DETECTED Final   Parainfluenza Virus 2 NOT DETECTED NOT DETECTED Final   Parainfluenza Virus 3 NOT DETECTED NOT DETECTED Final   Parainfluenza Virus 4 NOT DETECTED NOT DETECTED Final   Respiratory Syncytial Virus NOT DETECTED NOT DETECTED Final   Bordetella pertussis NOT DETECTED NOT DETECTED Final   Bordetella Parapertussis NOT DETECTED NOT DETECTED Final   Chlamydophila pneumoniae NOT DETECTED NOT DETECTED Final   Mycoplasma pneumoniae NOT DETECTED NOT DETECTED Final    Comment: Performed at [facility], [address], [place], [place] [address]  MRSA Next Gen by PCR, Nasal     Status: None   Collection Time: 07/27/23 12:40 AM   Specimen: Nasal Mucosa; Nasal Swab  Result  Value Ref Range Status   MRSA by PCR Next Gen NOT DETECTED NOT DETECTED Final    Comment: (NOTE) The GeneXpert MRSA Assay (FDA approved for NASAL specimens only), is one component of a comprehensive MRSA colonization surveillance program. It is not intended to diagnose MRSA infection nor to guide or monitor treatment for MRSA infections. Test performance is not FDA approved in patients less than 36 years old. Performed at [facility], [address], [place], [place] [address]     Radiology Studies: No results found.   Scheduled Meds:  albuterol   2.5 mg Nebulization Q6H   apixaban   5 mg Oral BID   arformoterol   15 mcg Nebulization BID   atorvastatin   40 mg Oral Daily   azithromycin   500 mg Oral Daily   budesonide  (PULMICORT ) nebulizer solution  0.5 mg Nebulization BID   escitalopram   10 mg Oral QHS   feeding supplement (GLUCERNA SHAKE)  237 mL Oral TID BM   ferrous sulfate   325 mg Oral Q breakfast   guaiFENesin   600 mg Oral BID   insulin  aspart  0-15 Units Subcutaneous TID WC   insulin  glargine-yfgn  20 Units Subcutaneous Daily   isosorbide  mononitrate  15 mg Oral Daily   methylPREDNISolone  (SOLU-MEDROL ) injection  80 mg Intravenous Q12H   QUEtiapine   12.5 mg Oral QHS   revefenacin   175 mcg Nebulization Daily   sodium chloride  flush  10-40 mL Intracatheter Q12H   sodium chloride  flush  3 mL Intravenous Q4H while awake   traZODone   225 mg Oral QHS   Continuous Infusions:  sodium chloride  75 mL/hr at 07/31/23 1637   ceFEPime  (MAXIPIME ) IV 2 g (07/31/23 1559)  LOS: 5 days   55 minutes with more than 50% spent in reviewing records, counseling patient/family and coordinating care.  Reyes VEAR Gaw, MD [facility] [email or web address] 07/31/2023, 5:06 PM

## 2023-07-31 NOTE — Plan of Care (Signed)

## 2023-07-31 NOTE — Progress Notes (Signed)
 NAME:  Anthony Laymon., MRN:  [id number], DOB:  [birth year], LOS: 5 ADMISSION DATE:  07/26/2023, CONSULTATION DATE:  07/29/23 REFERRING MD:  Rosario CHIEF COMPLAINT:  Hypoxia   History of Present Illness:  Anthony [name]. is a 73 y.o. male who has a PMH as below including but not limited to COPD on 3L O2, ILD, past COVID, reported CHF (though echo from 7/18 with normal EF, no diastolic dysfunction. Echo from 2023 similar), PAF on Apixaban , PAD s/p L AKA. He resides at [facility].  He was admitted 07/26/23 after a fall and was found to have dyspnea and hypoxia. He had CT chest that demonstrated known emphysema along with bilateral lower lobe and RML consolidations. RVP was negative. He was started on CTX and Azithromycin .  Since admission, he had increasing O2 requirements. Azithromycin  had been stopped after 3 days. On 7/21, he was up to 100% FiO2 and 45L/min HHFNC. PCCM was subsequently asked to see in consultation for any additional recommendations. He has been diuresed and is -4L since admit. BNP from 7/20 was 245. He has been on Solumedrol 80mg  q12hrs since admit.  Pertinent  Medical History:  has CAD (coronary artery disease); Hyperlipidemia associated with type 2 diabetes mellitus (HCC); COPD (chronic obstructive pulmonary disease) (HCC); Tobacco abuse; Mood disorder (HCC); Bilateral inguinal hernia; Umbilical hernia; S/P bilateral inguinal hernia repair; Chronic right hip pain; Recurrent left inguinal hernia; S/P right THA, AA; Moderate episode of recurrent major depressive disorder (HCC); Rectal bleeding; Obesity (BMI 30-39.9); DM2 (diabetes mellitus, type 2) (HCC); Pain medication agreement signed; Aortic atherosclerosis (HCC); Insomnia; Multiple lung nodules; Chronic fatigue; Generalized weakness; Vitamin D  deficiency; Elevated vitamin B12 level; Acute neutrophilia; Spinal stenosis of lumbar region with neurogenic claudication; Chronic constipation; Chronic pain of right knee;  Cerebellar ataxia in diseases classified elsewhere [facility]); Fall against object; Right flank pain; Chronic respiratory failure with hypoxia (HCC); Hypokalemia; Bipolar 1 disorder (HCC); Pressure injury of skin; Rib fractures; History of DVT (deep vein thrombosis); HTN (hypertension); Paroxysmal atrial fibrillation with RVR (HCC); ILD (interstitial lung disease) (HCC); Acute exacerbation of CHF (congestive heart failure) (HCC); Acute respiratory failure with hypoxemia (HCC); and Community acquired pneumonia on their problem list.  Significant Hospital Events: Including procedures, antibiotic start and stop dates in addition to other pertinent events   7/18 admit 7/21 up to 45L and 100% FiO2. Back on azithromycin  in case this is legionella not getting full treatment course 7/22 frequent desaturations, back on bipap to try to re-recruit since desaturations worse while sleeping. Antibiotics escalated to cefepime .   Interim History / Subjective:  Anthony [name] is sleeping- history obtained from RN. He finally got sleep yesterday for the first time since hospitalization. He got vest therapy last night and this morning-- productive of a lot of mucus. Took off bipap around 3AM, but wore it for a significant amount of time yesterday. Remains on HHF today, still pretty sleepy.  Objective:  Blood pressure (!) 134/96, pulse 95, temperature 98 F (36.7 C), temperature source Oral, resp. rate (!) 26, height 5' 9 (1.753 m), weight 82.3 kg, SpO2 (!) 88%.    Vent Mode: BIPAP;PCV FiO2 (%):  [60 %-100 %] 95 % PEEP:  [5 cmH20-7 cmH20] 7 cmH20   Intake/Output Summary (Last 24 hours) at 07/31/2023 1001 Last data filed at 07/31/2023 0809 Gross per 24 hour  Intake 267.46 ml  Output 1200 ml  Net -932.54 ml   Filed Weights   07/29/23 0431 07/30/23 0355 07/31/23 0323  Weight: 79.5 kg 79.7 kg 82.3 kg    Physical Exam: General: lethargic man lying in bed sleeping in NAD Neuro: aroused during exam, fell back asleep  quickly HEENT: [place]/AT, eyes anicteric Cardiovascular: S1S2, RRR Lungs: breathing comfortably on HHFNC saturating 97%. Faint wheezing diffusely, but lying flat comfortably, no accessory muscle use  Abdomen:  obese, soft, nondistedned Musculoskeletal:  L AKA  Labs reviewed.  Legionella antigen pended.  Labs/imaging personally reviewed:  CT chest 7/18 > known emphysema along with bilateral lower lobe and RML consolidations.  Assessment & Plan:   Acute on chronic hypoxic respiratory failure - multifactorial with known COPD (with exacerbation) and ILD and now superimposed bilateral PNA. Highly doubt PE as he is on Apixaban  and resides at SNF so unlikely to have missed doses. Worsening yesterday was most likely related to mucus plugging and shunting given lack of respiratory distress during his desaturations and response to vest + bipap with maintained benefit in saturations back off bipap. -legionella antigen still pending -con't hypertonic saline q4hWA and vest as much as he will tolerate -BiPAP at bedtime+ PRN -con't brovana  and yupelri  -increase steroids back to 80mg  BID -work on titrating back down FiO2 on HHF -con't azithromycin  & broadened GNR coverage -collect sputum sample if able  Plan d/w RN at bedside.    DNR/DNI-- appropriate   Rest of care per primary team.  Best practice (evaluated daily):  Per primary team.  Labs   CBC: Recent Labs  Lab 07/26/23 0201 07/26/23 0210 07/27/23 0721 07/29/23 0234  WBC  --  21.8* 15.6* 17.5*  NEUTROABS  --  19.0* 14.6* 16.3*  HGB 11.2* 9.3* 9.9* 9.6*  HCT 33.0* 34.5* 35.3* 34.7*  MCV  --  72.9* 70.9* 70.2*  PLT  --  299 377 366    Basic Metabolic Panel: Recent Labs  Lab 07/26/23 0210 07/27/23 0721 07/29/23 0234 07/30/23 0228 07/31/23 0742  NA 134* 139  139 137 138 134*  K 4.8 4.4  4.4 4.5 3.9 4.5  CL 102 97*  98 101 98 98  CO2 21* 24  24 22 27 27   GLUCOSE 175* 172*  173* 249* 183* 280*  BUN 24* 32*  31* 48*  47* 52*  CREATININE 2.21* 1.46*  1.45* 1.21 1.30* 1.20  CALCIUM  9.0 9.0  9.1 9.3 9.5 8.9  MG  --  2.0  --  1.8  --   PHOS  --  4.0  4.1 3.6 4.9*  --    GFR: Estimated Creatinine Clearance: 54 mL/min (by C-G formula based on SCr of 1.2 mg/dL). Recent Labs  Lab 07/26/23 0210 07/27/23 0721 07/29/23 0234  PROCALCITON  --  1.25  --   WBC 21.8* 15.6* 17.5*    Liver Function Tests: Recent Labs  Lab 07/26/23 0210 07/27/23 0721 07/29/23 0234 07/30/23 0228  AST 23  --   --   --   ALT 14  --   --   --   ALKPHOS 57  --   --   --   BILITOT 0.9  --   --   --   PROT 7.2  --   --   --   ALBUMIN  3.3* 3.2* 3.2* 3.2*    ABG    Component Value Date/Time   PHART 7.41 07/29/2023 0031   PCO2ART 41 07/29/2023 0031   PO2ART 64 (L) 07/29/2023 0031   HCO3 26.0 07/29/2023 0031   TCO2 24 07/26/2023 0201   ACIDBASEDEF 2.0 07/26/2023 0201   O2SAT  92.3 07/29/2023 0031      Anthony SHAUNNA Gaskins, DO 07/31/23 10:01 AM [facility]  For contact information, see Amion. If no response to pager, please call PCCM consult pager. After hours, 7PM- 7AM, please call Elink.

## 2023-07-31 NOTE — TOC Progression Note (Addendum)
 Transition of Care (TOC) - Progression Note    Patient Details  Name: Anthony [name]. MRN: [id number] Date of Birth: [birth year]  Transition of Care [facility]) CM/SW Contact  Lauraine FORBES Saa, LCSW Phone Number: 07/31/2023, 1:05 PM  Clinical Narrative:     1:05 PM Per progressions, patient is expected to be ordered BiPAP for home use upon discharge. CSW informed [facility] of potential BiPAP needs. CSW continues to follow for disposition needs.  Expected Discharge Plan: Long Term Nursing Home Barriers to Discharge: Continued Medical Work up               Expected Discharge Plan and Services In-house Referral: Clinical Social Work   Post Acute Care Choice: Skilled Nursing Facility Living arrangements for the past 2 months: Skilled Nursing Facility                                       Social Drivers of Health (SDOH) Interventions SDOH Screenings   Food Insecurity: Patient Unable To Answer (07/26/2023)  Housing: Patient Unable To Answer (07/26/2023)  Transportation Needs: Patient Unable To Answer (07/26/2023)  Utilities: Patient Unable To Answer (07/26/2023)  Depression (PHQ2-9): Medium Risk (11/27/2019)  Financial Resource Strain: Low Risk  (09/15/2020)   [facility] from [facility]  Social Connections: Patient Unable To Answer (07/26/2023)  Stress: No Stress Concern Present (09/15/2020)   Received from [facility]  Tobacco Use: Medium Risk (07/26/2023)    Readmission Risk Interventions    02/13/2021    3:47 PM  Readmission Risk Prevention Plan  Transportation Screening Complete  PCP or Specialist Appt within 5-7 Days Complete  Home Care Screening Complete  Medication Review (RN CM) Complete

## 2023-07-31 NOTE — Plan of Care (Signed)
   Problem: Nutritional: Goal: Maintenance of adequate nutrition will improve Outcome: Progressing

## 2023-08-01 DIAGNOSIS — J441 Chronic obstructive pulmonary disease with (acute) exacerbation: Secondary | ICD-10-CM | POA: Diagnosis not present

## 2023-08-01 DIAGNOSIS — N179 Acute kidney failure, unspecified: Secondary | ICD-10-CM

## 2023-08-01 DIAGNOSIS — J9601 Acute respiratory failure with hypoxia: Secondary | ICD-10-CM | POA: Diagnosis not present

## 2023-08-01 DIAGNOSIS — J189 Pneumonia, unspecified organism: Secondary | ICD-10-CM | POA: Diagnosis not present

## 2023-08-01 DIAGNOSIS — R7989 Other specified abnormal findings of blood chemistry: Secondary | ICD-10-CM

## 2023-08-01 DIAGNOSIS — I509 Heart failure, unspecified: Secondary | ICD-10-CM | POA: Diagnosis not present

## 2023-08-01 DIAGNOSIS — T17500A Unspecified foreign body in bronchus causing asphyxiation, initial encounter: Secondary | ICD-10-CM

## 2023-08-01 DIAGNOSIS — Z7901 Long term (current) use of anticoagulants: Secondary | ICD-10-CM

## 2023-08-01 DIAGNOSIS — J9621 Acute and chronic respiratory failure with hypoxia: Secondary | ICD-10-CM | POA: Diagnosis not present

## 2023-08-01 LAB — GLUCOSE, CAPILLARY
Glucose-Capillary: 255 mg/dL — ABNORMAL HIGH (ref 70–99)
Glucose-Capillary: 293 mg/dL — ABNORMAL HIGH (ref 70–99)
Glucose-Capillary: 358 mg/dL — ABNORMAL HIGH (ref 70–99)
Glucose-Capillary: 287 mg/dL — ABNORMAL HIGH (ref 70–99)

## 2023-08-01 LAB — BASIC METABOLIC PANEL WITH GFR
Anion gap: 7 (ref 5–15)
BUN: 37 mg/dL — ABNORMAL HIGH (ref 8–23)
CO2: 27 mmol/L (ref 22–32)
Calcium: 8.7 mg/dL — ABNORMAL LOW (ref 8.9–10.3)
Chloride: 101 mmol/L (ref 98–111)
Creatinine, Ser: 0.97 mg/dL (ref 0.61–1.24)
GFR, Estimated: >60 mL/min (ref >60)
Glucose, Bld: 238 mg/dL — ABNORMAL HIGH (ref 70–99)
Potassium: 4.9 mmol/L (ref 3.5–5.1)
Sodium: 135 mmol/L (ref 135–145)

## 2023-08-01 MED ORDER — FUROSEMIDE 10 MG/ML IJ SOLN
40.0000 mg | Freq: Every day | INTRAMUSCULAR | Status: DC
  Administered 2023-08-01 – 2023-08-02 (×2): 40 mg via INTRAVENOUS
  Filled 2023-08-01 (×2): qty 4

## 2023-08-01 MED ORDER — INSULIN ASPART 100 UNIT/ML IJ SOLN
5.0000 [IU] | Freq: Three times a day (TID) | INTRAMUSCULAR | Status: DC
  Administered 2023-08-01 – 2023-08-04 (×9): 5 [IU] via SUBCUTANEOUS

## 2023-08-01 NOTE — Progress Notes (Signed)
 [facility]  PROGRESS NOTE  Anthony [name]. FMW:[id number] DOB: [birth year] DOA: 07/26/2023 PCP: No primary care provider on file.   Brief HPI:   73 y.o. male with a PMH significant for interstitial lung disease, COPD, COVID infection, coronary artery disease, diastolic heart failure, paroxysmal atrial fibrillation, DVT, peripheral artery disease s/p left above-knee amputation, diabetes mellitus type 2 and hyperlipidemia.  Patient is a skilled nursing facility resident.  Patient was admitted with altered mentation, acute on chronic respiratory failure, COPD with exacerbation, and multilobar pneumonia.     Assessment/Plan:   Acute on chronic hypoxic respiratory failure: COPD with exacerbation/ILD: - Pulmonology consulted 7/21 due rising oxygen requirements.  Continues on HHFNC 40 L/min, 70% FiO2 -Pulmonology consulted, on steroids and antibiotics. - Continue cefepime  to complete 7 days; continue Zithromax  -Currently on vest therapy and hypertonic saline nebs to help with pulmonary clearance -Continue to wean off oxygen   Possible CHF with exacerbation: - IV Lasix  80 Mg every 8 hourly x 3 doses.  - Echo did not show any change in his ejection fraction although it was a difficult acoustical windows study. -Net -2.8 L -Start Lasix  40 mg IV daily -Follow strict intake and output -   Multilobar pneumonia: - Follow final cultures. - Now on cefepime  for broad and gram-negative coverage -Repeat chest x-ray continues to show improvement. - Nasal swab for MRSA came back negative.  Respiratory viral panel came back negative. -Plan to continue with cefepime  for total 7 days -Continue Zithromax  -Urine for Legionella antigen is currently pending   DM2:   - History of the same. - We are chasing his CBGs as he is continuing on steroids. - Appreciate diabetes coordinator input. - Continue Semglee  25 units subcu daily - Start NovoLog  5 units 3 times daily meal coverage   AKI: -  Secondary to initial illness. - Continues to downtrend. -Phosphorus minimally elevated.  Will recheck in AM.   Leukocytosis: - Remains elevated.  Pulmonology has broadened to cefepime  today. - Being treated for multilobar pneumonia as above.   Microcytic anemia: - Seems to be a new problem for him. - Last hemoglobin we have prior to this year was 2023 and his hemoglobin has typically been high at that time. - No acute evidence of blood loss.   - Serum iron 8, saturation 2%, ferritin 39 -He will need GI evaluation with endoscopy as outpatient   Acute encephalopathy, metabolic, cannot rule out toxic: - Resolved significantly.  Patient oriented on my exam today. - Likely multifactorial.   History of PAD: - Status post left AKA.     Goals of care:  -Patient tells me he does not have any family involved in his care -- in reality he does have a daughter who is medical POA - Consulted palliative care for further discussion regarding goals of care.  He is currently DNR/DNI. DVT prophylaxis:    apixaban  (ELIQUIS ) tablet 5 mg     Medications     apixaban   5 mg Oral BID   arformoterol   15 mcg Nebulization BID   atorvastatin   40 mg Oral Daily   azithromycin   500 mg Oral Daily   budesonide  (PULMICORT ) nebulizer solution  0.5 mg Nebulization BID   escitalopram   10 mg Oral QHS   feeding supplement (GLUCERNA SHAKE)  237 mL Oral TID BM   ferrous sulfate   325 mg Oral Q breakfast   guaiFENesin   600 mg Oral BID   insulin  aspart  0-15 Units Subcutaneous TID WC  insulin  glargine-yfgn  25 Units Subcutaneous Daily   isosorbide  mononitrate  15 mg Oral Daily   methylPREDNISolone  (SOLU-MEDROL ) injection  80 mg Intravenous Q12H   QUEtiapine   12.5 mg Oral QHS   revefenacin   175 mcg Nebulization Daily   sodium chloride  flush  10-40 mL Intracatheter Q12H   sodium chloride  flush  3 mL Intravenous Q4H while awake   traZODone   225 mg Oral QHS     Data Reviewed:   CBG:  Recent Labs  Lab  07/31/23 0619 07/31/23 1158 07/31/23 1620 07/31/23 2110 08/01/23 0623  GLUCAP 292* 312* 370* 273* 255*    SpO2: 98 % O2 Flow Rate (L/min): 40 L/min FiO2 (%): 80 %    Vitals:   07/31/23 1948 07/31/23 2039 08/01/23 0243 08/01/23 0625  BP:    (Abnormal) 122/59  Pulse: 68   62  Resp: 18   20  Temp:    99.1 F (37.3 C)  TempSrc:    Oral  SpO2: 95% 95% 96% 98%  Weight:    83.4 kg  Height:          Data Reviewed:  Basic Metabolic Panel: Recent Labs  Lab 07/26/23 0210 07/27/23 0721 07/29/23 0234 07/30/23 0228 07/31/23 0742  NA 134* 139  139 137 138 134*  K 4.8 4.4  4.4 4.5 3.9 4.5  CL 102 97*  98 101 98 98  CO2 21* 24  24 22 27 27   GLUCOSE 175* 172*  173* 249* 183* 280*  BUN 24* 32*  31* 48* 47* 52*  CREATININE 2.21* 1.46*  1.45* 1.21 1.30* 1.20  CALCIUM  9.0 9.0  9.1 9.3 9.5 8.9  MG  --  2.0  --  1.8  --   PHOS  --  4.0  4.1 3.6 4.9*  --     CBC: Recent Labs  Lab 07/26/23 0201 07/26/23 0210 07/27/23 0721 07/29/23 0234  WBC  --  21.8* 15.6* 17.5*  NEUTROABS  --  19.0* 14.6* 16.3*  HGB 11.2* 9.3* 9.9* 9.6*  HCT 33.0* 34.5* 35.3* 34.7*  MCV  --  72.9* 70.9* 70.2*  PLT  --  299 377 366    LFT Recent Labs  Lab 07/26/23 0210 07/27/23 0721 07/29/23 0234 07/30/23 0228  AST 23  --   --   --   ALT 14  --   --   --   ALKPHOS 57  --   --   --   BILITOT 0.9  --   --   --   PROT 7.2  --   --   --   ALBUMIN  3.3* 3.2* 3.2* 3.2*     Antibiotics: Anti-infectives (From admission, onward)    Start     Dose/Rate Route Frequency Ordered Stop   07/30/23 1500  ceFEPIme  (MAXIPIME ) 2 g in sodium chloride  0.9 % 100 mL IVPB        2 g 200 mL/hr over 30 Minutes Intravenous Every 12 hours 07/30/23 1406     07/30/23 1000  azithromycin  (ZITHROMAX ) tablet 500 mg  Status:  Discontinued        500 mg Oral  Once 07/30/23 0715 07/30/23 0716   07/30/23 1000  azithromycin  (ZITHROMAX ) tablet 500 mg        500 mg Oral Daily 07/30/23 0716     07/29/23 1630   azithromycin  (ZITHROMAX ) 500 mg in sodium chloride  0.9 % 250 mL IVPB  Status:  Discontinued        500 mg 250  mL/hr over 60 Minutes Intravenous Every 24 hours 07/29/23 1531 07/30/23 0715   07/26/23 0600  cefTRIAXone  (ROCEPHIN ) 1 g in sodium chloride  0.9 % 100 mL IVPB        1 g 200 mL/hr over 30 Minutes Intravenous Every 24 hours 07/26/23 0532 07/30/23 1449   07/26/23 0600  azithromycin  (ZITHROMAX ) 500 mg in sodium chloride  0.9 % 250 mL IVPB        500 mg 250 mL/hr over 60 Minutes Intravenous Every 24 hours 07/26/23 0549 07/28/23 0918   07/26/23 0545  azithromycin  (ZITHROMAX ) tablet 500 mg  Status:  Discontinued        500 mg Oral Daily 07/26/23 0532 07/26/23 0549        DVT prophylaxis:   Code Status: Patient's daughter, Anthony [name], who happens to be patient's power of attorney, has confirmed that patient is DNR   Family Communication:    CONSULTS pulmonology   Subjective   No new complaints.  Continues to require 40 L/min, HHFNC   Objective    Physical Examination:   General: Appears in no acute distress Cardiovascular: S1-S2, regular Respiratory: Decreased breath sounds bilaterally at lung bases Abdomen: Soft, nontender, no organomegaly Extremities: No edema in the lower extremities Neurologic: Alert, oriented x 3, intact insight and judgment   Status is: Inpatient:             Anthony [name]   [facility] If 7PM-7AM, please contact night-coverage at [email or web address], Office  [phone]   08/01/2023, 7:44 AM  LOS: 6 days

## 2023-08-01 NOTE — Inpatient Diabetes Management (Signed)
 Inpatient Diabetes Program Recommendations  AACE/ADA: New Consensus Statement on Inpatient Glycemic Control  Target Ranges:  Prepandial:   less than 140 mg/dL      Peak postprandial:   less than 180 mg/dL (1-2 hours)      Critically ill patients:  140 - 180 mg/dL    Latest Reference Range & Units 07/31/23 06:19 07/31/23 11:58 07/31/23 16:20 07/31/23 21:10 08/01/23 06:23  Glucose-Capillary 70 - 99 mg/dL 707 (H) 687 (H) 629 (H) 273 (H) 255 (H)   Review of Glycemic Control  Diabetes history: DM2 Outpatient Diabetes medications: Glipizide  10 mg daily, Metformin  1000 mg BID, Rybelsus  7 mg daily Current orders for Inpatient glycemic control: Semglee  25 units daily, Novolog  0-15 units TID with meals; Solumedrol 80 mg Q12H   Inpatient Diabetes Program Recommendations:     Insulin : Noted Semglee  increased from 20 to 25 units daily today and already given this morning.  If steroids are continued as ordered, please consider ordering Novolog  6 units TID with meals for meal coverage if patient eats at least 50% of meals.   Thanks, Earnie Gainer, RN, MSN, CDCES Diabetes Coordinator Inpatient Diabetes Program [phone] (Team Pager from 8am to 5pm)

## 2023-08-01 NOTE — Plan of Care (Signed)
  Problem: Nutritional: Goal: Maintenance of adequate nutrition will improve Outcome: Progressing   Problem: Skin Integrity: Goal: Risk for impaired skin integrity will decrease Outcome: Progressing   Problem: Clinical Measurements: Goal: Will remain free from infection Outcome: Progressing

## 2023-08-01 NOTE — Progress Notes (Signed)
 Patient resting comfortably on HHFNC. Patient consistently drinking water  and having coughing spells so RN requested to leave patient on HHFNC as long as tolerated.

## 2023-08-01 NOTE — Plan of Care (Signed)
  Problem: Fluid Volume: Goal: Ability to maintain a balanced intake and output will improve Outcome: Progressing   Problem: Nutritional: Goal: Maintenance of adequate nutrition will improve Outcome: Progressing   Problem: Education: Goal: Knowledge of General Education information will improve Description: Including pain rating scale, medication(s)/side effects and non-pharmacologic comfort measures Outcome: Progressing

## 2023-08-01 NOTE — Progress Notes (Signed)
 NAME:  Anthony Maqueda., MRN:  [id number], DOB:  [birth year], LOS: 6 ADMISSION DATE:  07/26/2023, CONSULTATION DATE:  07/29/23 REFERRING MD:  Rosario CHIEF COMPLAINT:  Hypoxia   History of Present Illness:  Anthony Schetter. is a 74 y.o. male who has a PMH as below including but not limited to COPD on 3L O2, ILD, past COVID, reported CHF (though echo from 7/18 with normal EF, no diastolic dysfunction. Echo from 2023 similar), PAF on Apixaban , PAD s/p L AKA. He resides at [facility].  He was admitted 07/26/23 after a fall and was found to have dyspnea and hypoxia. He had CT chest that demonstrated known emphysema along with bilateral lower lobe and RML consolidations. RVP was negative. He was started on CTX and Azithromycin .  Since admission, he had increasing O2 requirements. Azithromycin  had been stopped after 3 days. On 7/21, he was up to 100% FiO2 and 45L/min HHFNC. PCCM was subsequently asked to see in consultation for any additional recommendations. He has been diuresed and is -4L since admit. BNP from 7/20 was 245. He has been on Solumedrol 80mg  q12hrs since admit.  Pertinent  Medical History:  has CAD (coronary artery disease); Hyperlipidemia associated with type 2 diabetes mellitus (HCC); COPD (chronic obstructive pulmonary disease) (HCC); Tobacco abuse; Mood disorder (HCC); Bilateral inguinal hernia; Umbilical hernia; S/P bilateral inguinal hernia repair; Chronic right hip pain; Recurrent left inguinal hernia; S/P right THA, AA; Moderate episode of recurrent major depressive disorder (HCC); Rectal bleeding; Obesity (BMI 30-39.9); DM2 (diabetes mellitus, type 2) (HCC); Pain medication agreement signed; Aortic atherosclerosis (HCC); Insomnia; Multiple lung nodules; Chronic fatigue; Generalized weakness; Vitamin D  deficiency; Elevated vitamin B12 level; Acute neutrophilia; Spinal stenosis of lumbar region with neurogenic claudication; Chronic constipation; Chronic pain of right knee;  Cerebellar ataxia in diseases classified elsewhere [facility]); Fall against object; Right flank pain; Chronic respiratory failure with hypoxia (HCC); Hypokalemia; Bipolar 1 disorder (HCC); Pressure injury of skin; Rib fractures; History of DVT (deep vein thrombosis); HTN (hypertension); Paroxysmal atrial fibrillation with RVR (HCC); ILD (interstitial lung disease) (HCC); Acute exacerbation of CHF (congestive heart failure) (HCC); Acute respiratory failure with hypoxemia (HCC); and Community acquired pneumonia on their problem list.  Significant Hospital Events: Including procedures, antibiotic start and stop dates in addition to other pertinent events   7/18 admit 7/21 up to 45L and 100% FiO2. Back on azithromycin  in case this is legionella not getting full treatment course 7/22 frequent desaturations, back on bipap to try to re-recruit since desaturations worse while sleeping. Antibiotics escalated to cefepime .   Interim History / Subjective:  Anthony [name] is feeling about the same this morning. He remains on HHFNC. He briefly wore bipap yesterday but not overnight.   Objective:  Blood pressure 120/76, pulse (!) 58, temperature 98.5 F (36.9 C), temperature source Oral, resp. rate 17, height 5' 9 (1.753 m), weight 83.4 kg, SpO2 97%.    FiO2 (%):  [60 %-100 %] 80 % PEEP:  [7 cmH20] 7 cmH20   Intake/Output Summary (Last 24 hours) at 08/01/2023 0825 Last data filed at 07/31/2023 1627 Gross per 24 hour  Intake 1457.01 ml  Output --  Net 1457.01 ml   Filed Weights   07/30/23 0355 07/31/23 0323 08/01/23 0625  Weight: 79.7 kg 82.3 kg 83.4 kg    Physical Exam: General: Chronically ill-appearing man lying in bed no acute distress Neuro: Lethargic, arouses during exam and answers questions, moving extremities HEENT: [place]/AT, eyes anicteric Cardiovascular: S1-S2, regular rate and  rhythm Lungs: Breathing comfortably lying flat in bed on heated high flow nasal cannula, saturating the upper 90s.  Less  wheezing, rhonchi in the bases. Abdomen: Obese, soft, nondistended Musculoskeletal: Left AKA, no significant edema   Blood cultures negative Legionella antigen pending.  Labs/imaging personally reviewed:  CT chest 7/18 > known emphysema along with bilateral lower lobe and RML consolidations.  Assessment & Plan:   Acute on chronic hypoxic respiratory failure - multifactorial with known COPD (with exacerbation) and ILD and now superimposed bilateral PNA. Highly doubt PE as he is on Apixaban  and resides at SNF so unlikely to have missed doses. Worsening yesterday was most likely related to mucus plugging and shunting given lack of respiratory distress during his desaturations and response to vest + bipap with maintained benefit in saturations back off bipap. - Legionella antigen still pending PEEP with plan for 7 days of azithromycin  then stop if this does not result sooner -Continue cefepime  to complete 7 days -Continue steroids twice daily -BiPAP nightly and as needed as tolerated - Encourage vest therapy and hypertonic saline nebs to help with pulmonary clearance as I suspect shunt is part of his hypoxia. - Bronchodilators-Brovana  and Yupelri , DuoNeb as needed -Sputum culture has been a challenge to obtain, they have not been able to nasotracheally suction to assist with this -Continue efforts at weaning supplemental oxygen.  Still on quite a bit of oxygen and nowhere near being able to go home.  Hopefully we will continue to make headway in the coming days.  Palliative care consultation is appropriate given significant baseline lung disease.  Plan d/w primary team.   DNR/DNI-- appropriate   Rest of care per primary team.  Best practice (evaluated daily):  Per primary team.  Labs   CBC: Recent Labs  Lab 07/26/23 0201 07/26/23 0210 07/27/23 0721 07/29/23 0234  WBC  --  21.8* 15.6* 17.5*  NEUTROABS  --  19.0* 14.6* 16.3*  HGB 11.2* 9.3* 9.9* 9.6*  HCT 33.0* 34.5* 35.3*  34.7*  MCV  --  72.9* 70.9* 70.2*  PLT  --  299 377 366    Basic Metabolic Panel: Recent Labs  Lab 07/26/23 0210 07/27/23 0721 07/29/23 0234 07/30/23 0228 07/31/23 0742  NA 134* 139  139 137 138 134*  K 4.8 4.4  4.4 4.5 3.9 4.5  CL 102 97*  98 101 98 98  CO2 21* 24  24 22 27 27   GLUCOSE 175* 172*  173* 249* 183* 280*  BUN 24* 32*  31* 48* 47* 52*  CREATININE 2.21* 1.46*  1.45* 1.21 1.30* 1.20  CALCIUM  9.0 9.0  9.1 9.3 9.5 8.9  MG  --  2.0  --  1.8  --   PHOS  --  4.0  4.1 3.6 4.9*  --    GFR: Estimated Creatinine Clearance: 54 mL/min (by C-G formula based on SCr of 1.2 mg/dL). Recent Labs  Lab 07/26/23 0210 07/27/23 0721 07/29/23 0234  PROCALCITON  --  1.25  --   WBC 21.8* 15.6* 17.5*    Liver Function Tests: Recent Labs  Lab 07/26/23 0210 07/27/23 0721 07/29/23 0234 07/30/23 0228  AST 23  --   --   --   ALT 14  --   --   --   ALKPHOS 57  --   --   --   BILITOT 0.9  --   --   --   PROT 7.2  --   --   --   ALBUMIN  3.3*  3.2* 3.2* 3.2*     Leita SHAUNNA Gaskins, DO 08/01/23 9:31 AM [facility]  For contact information, see Amion. If no response to pager, please call PCCM consult pager. After hours, 7PM- 7AM, please call Elink.

## 2023-08-02 DIAGNOSIS — I483 Typical atrial flutter: Secondary | ICD-10-CM | POA: Insufficient documentation

## 2023-08-02 DIAGNOSIS — I5033 Acute on chronic diastolic (congestive) heart failure: Secondary | ICD-10-CM | POA: Diagnosis not present

## 2023-08-02 DIAGNOSIS — E785 Hyperlipidemia, unspecified: Secondary | ICD-10-CM

## 2023-08-02 DIAGNOSIS — J439 Emphysema, unspecified: Secondary | ICD-10-CM | POA: Diagnosis not present

## 2023-08-02 DIAGNOSIS — I2510 Atherosclerotic heart disease of native coronary artery without angina pectoris: Secondary | ICD-10-CM | POA: Diagnosis not present

## 2023-08-02 DIAGNOSIS — J8410 Pulmonary fibrosis, unspecified: Secondary | ICD-10-CM | POA: Diagnosis not present

## 2023-08-02 DIAGNOSIS — I480 Paroxysmal atrial fibrillation: Secondary | ICD-10-CM | POA: Diagnosis not present

## 2023-08-02 DIAGNOSIS — J9621 Acute and chronic respiratory failure with hypoxia: Secondary | ICD-10-CM | POA: Diagnosis not present

## 2023-08-02 LAB — GLUCOSE, CAPILLARY
Glucose-Capillary: 296 mg/dL — ABNORMAL HIGH (ref 70–99)
Glucose-Capillary: 273 mg/dL — ABNORMAL HIGH (ref 70–99)
Glucose-Capillary: 317 mg/dL — ABNORMAL HIGH (ref 70–99)
Glucose-Capillary: 203 mg/dL — ABNORMAL HIGH (ref 70–99)

## 2023-08-02 LAB — LEGIONELLA PNEUMOPHILA SEROGP 1 UR AG: L. pneumophila Serogp 1 Ur Ag: NEGATIVE

## 2023-08-02 MED ORDER — METOPROLOL SUCCINATE ER 25 MG PO TB24: 12.5000 mg | ORAL_TABLET | Freq: Every day | ORAL | Status: DC

## 2023-08-02 MED ORDER — INSULIN GLARGINE-YFGN 100 UNIT/ML ~~LOC~~ SOLN
30.0000 [IU] | Freq: Every day | SUBCUTANEOUS | Status: DC
  Filled 2023-08-02: qty 0.3

## 2023-08-02 NOTE — Plan of Care (Signed)
  Problem: Fluid Volume: Goal: Ability to maintain a balanced intake and output will improve Outcome: Progressing   Problem: Metabolic: Goal: Ability to maintain appropriate glucose levels will improve Outcome: Progressing   Problem: Education: Goal: Knowledge of General Education information will improve Description: Including pain rating scale, medication(s)/side effects and non-pharmacologic comfort measures Outcome: Progressing   Problem: Clinical Measurements: Goal: Cardiovascular complication will be avoided Outcome: Progressing

## 2023-08-02 NOTE — Inpatient Diabetes Management (Signed)
 Inpatient Diabetes Program Recommendations  AACE/ADA: New Consensus Statement on Inpatient Glycemic Control   Target Ranges:  Prepandial:   less than 140 mg/dL      Peak postprandial:   less than 180 mg/dL (1-2 hours)      Critically ill patients:  140 - 180 mg/dL    Latest Reference Range & Units 08/01/23 06:23 08/01/23 12:26 08/01/23 15:55 08/01/23 21:16 08/02/23 06:16  Glucose-Capillary 70 - 99 mg/dL 744 (H) 706 (H) 641 (H) 287 (H) 296 (H)   Review of Glycemic Control  Diabetes history: DM2 Outpatient Diabetes medications: Glipizide  10 mg daily, Metformin  1000 mg BID, Rybelsus  7 mg daily Current orders for Inpatient glycemic control: Semglee  25 units daily, Novolog  0-15 units TID with meals, Novolog  5 units TID with meals; Solumedrol 80 mg Q12H   Inpatient Diabetes Program Recommendations:     Insulin :  If steroids are continued as ordered, please increasing Semglee  to 35 units daily and meal coverage to Novolog  8 units TID with meals if patient eats at least 50% of meals.   Thanks, Earnie Gainer, RN, MSN, CDCES Diabetes Coordinator Inpatient Diabetes Program [phone] (Team Pager from 8am to 5pm)

## 2023-08-02 NOTE — Plan of Care (Signed)
  Problem: Fluid Volume: Goal: Ability to maintain a balanced intake and output will improve Outcome: Progressing   Problem: Nutritional: Goal: Maintenance of adequate nutrition will improve Outcome: Progressing   Problem: Skin Integrity: Goal: Risk for impaired skin integrity will decrease Outcome: Progressing

## 2023-08-02 NOTE — Progress Notes (Signed)
 PROGRESS NOTE  Anthony [name] Ruth Mickey.  FMW:[id number] DOB: 05/12/1950 DOA: 07/26/2023 PCP: No primary care provider on file.  Consultants   Brief Narrative: 73 y.o. male with a PMH significant for interstitial lung disease, COPD, COVID infection, coronary artery disease, diastolic heart failure, paroxysmal atrial fibrillation, DVT, peripheral artery disease s/p left above-knee amputation, diabetes mellitus type 2 and hyperlipidemia.  Patient is a skilled nursing facility resident.  Patient was admitted with altered mentation, acute on chronic respiratory failure, COPD with exacerbation, and multilobar pneumonia.  Patient's daughter, Cena Potters, who happens to be patient's power of attorney, has confirmed that patient is DNR   Assessment & Plan:  Acute on chronic hypoxic respiratory failure: COPD with exacerbation/ILD: - Pulmonology consulted 7/21 due rising oxygen requirements. -  Still on HHNC 40L.   - Strep/Legionella antigen negative.  DC azithromycin  today. - Plan to continue steroids. - Appreciate pulmonology input. - Slow attempts at weaning oxygen.  A flutter/fib with AVR - New onset this morning.  Heart rate this hospitalization is probably been in the 60s to 80s. - It was 120s on my examination. - Plan to continue Eliquis . - Home beta-blocker is being held secondary to low systolic blood pressure. - Cardiology has been consulted, greatly appreciate input.  Acute on chronic HFpEF - Down 8 L since admission.  Now appears euvolemic - Echo did not show any change in his ejection fraction although it was a difficult acoustical windows study. -Currently on Lasix  40 mg daily, will continue.   Multilobar pneumonia: - Follow final cultures. - Now on cefepime  for broad and gram-negative coverage -Repeat chest x-ray continues to show improvement. - Nasal swab for MRSA came back negative.  Respiratory viral panel came back negative.  DM2:   - History of the same. - We are chasing  his CBGs as he is continuing on steroids. - Appreciate diabetes coordinator input. - Increasing Semglee  again today   AKI: - Secondary to initial illness. - Continues to downtrend. -Phosphorus minimally elevated.  Will recheck.  Leukocytosis: - Remains elevated.  Pulmonology has broadened to cefepime  today. - Being treated for multilobar pneumonia as above.  Microcytic anemia: - Seems to be a new problem for him. - Last hemoglobin we have prior to this year was 2023 and his hemoglobin has typically been high at that time. - No acute evidence of blood loss.  Will check iron studies.   Acute encephalopathy, metabolic, cannot rule out toxic: - Resolved significantly.  Patient oriented on my exam today. - Likely multifactorial.   History of PAD: - Status post left AKA.    Goals of care:  -Patient tells me he does not have any family involved in his care -- in reality he does have a daughter who is medical POA - Consulted palliative care for further discussion regarding goals of care.  He is currently DNR/DNI.   DVT prophylaxis:   apixaban  (ELIQUIS ) tablet 5 mg  Code Status:   Code Status: Limited: Do not attempt resuscitation (DNR) -DNR-LIMITED -Do Not Intubate/DNI  Level of care: Progressive Status is: Inpatient Remain in stepdown   Consults called: Pulmonology, cardiology  Subjective: Patient awake alert and sitting in chair beside bed this morning.  Remembers seeing me from 2 days ago.  No complaints today except he wants to move back into the bed.  Eating and drinking well.  Objective: Vitals:   08/02/23 0744 08/02/23 0810 08/02/23 1100 08/02/23 1118  BP:  131/69  96/67  Pulse:  80  96 86  Resp:  18 (!) 23 20  Temp:  97.9 F (36.6 C)  98.5 F (36.9 C)  TempSrc:  Oral  Oral  SpO2: 90% 98% (!) 85% 90%  Weight:      Height:        Intake/Output Summary (Last 24 hours) at 08/02/2023 1450 Last data filed at 08/02/2023 1325 Gross per 24 hour  Intake 480 ml  Output  5150 ml  Net -4670 ml   Filed Weights   07/31/23 0323 08/01/23 0625 08/02/23 0311  Weight: 82.3 kg 83.4 kg 80 kg   Body mass index is 26.05 kg/m.  Gen: 73 y.o. male in no apparent distress.  Chronically ill-appearing and wearing oxygen mask Pulm: Non-labored breathing.  No accessory muscle use.  Rales right lower base.  Wearing high flow nasal cannula CV: Irregular rate/rhythm with heart rate in the 120s. GI: Abdomen soft, non-tender, non-distended Ext: Warm, no deformities, no pedal edema Skin: No rashes, lesions  Neuro: Alert and oriented today.  No focal neurological deficits. Psych: Calm     I have personally reviewed the following labs and images: CBC: Recent Labs  Lab 07/27/23 0721 07/29/23 0234  WBC 15.6* 17.5*  NEUTROABS 14.6* 16.3*  HGB 9.9* 9.6*  HCT 35.3* 34.7*  MCV 70.9* 70.2*  PLT 377 366   BMP &GFR Recent Labs  Lab 07/27/23 0721 07/29/23 0234 07/30/23 0228 07/31/23 0742 08/01/23 0805  NA 139  139 137 138 134* 135  K 4.4  4.4 4.5 3.9 4.5 4.9  CL 97*  98 101 98 98 101  CO2 24  24 22 27 27 27   GLUCOSE 172*  173* 249* 183* 280* 238*  BUN 32*  31* 48* 47* 52* 37*  CREATININE 1.46*  1.45* 1.21 1.30* 1.20 0.97  CALCIUM  9.0  9.1 9.3 9.5 8.9 8.7*  MG 2.0  --  1.8  --   --   PHOS 4.0  4.1 3.6 4.9*  --   --    Estimated Creatinine Clearance: 66.8 mL/min (by C-G formula based on SCr of 0.97 mg/dL). Liver & Pancreas: Recent Labs  Lab 07/27/23 0721 07/29/23 0234 07/30/23 0228  ALBUMIN  3.2* 3.2* 3.2*   No results for input(s): LIPASE, AMYLASE in the last 168 hours. No results for input(s): AMMONIA in the last 168 hours. Diabetic: No results for input(s): HGBA1C in the last 72 hours. Recent Labs  Lab 08/01/23 1226 08/01/23 1555 08/01/23 2116 08/02/23 0616 08/02/23 1115  GLUCAP 293* 358* 287* 296* 273*   Cardiac Enzymes: No results for input(s): CKTOTAL, CKMB, CKMBINDEX, TROPONINI in the last 168 hours. No results for  input(s): PROBNP in the last 8760 hours. Coagulation Profile: No results for input(s): INR, PROTIME in the last 168 hours. Thyroid  Function Tests: No results for input(s): TSH, T4TOTAL, FREET4, T3FREE, THYROIDAB in the last 72 hours. Lipid Profile: No results for input(s): CHOL, HDL, LDLCALC, TRIG, CHOLHDL, LDLDIRECT in the last 72 hours. Anemia Panel: No results for input(s): VITAMINB12, FOLATE, FERRITIN, TIBC, IRON, RETICCTPCT in the last 72 hours. Urine analysis:    Component Value Date/Time   COLORURINE YELLOW 07/26/2023 0621   APPEARANCEUR HAZY (A) 07/26/2023 0621   LABSPEC 1.014 07/26/2023 0621   PHURINE 5.0 07/26/2023 0621   GLUCOSEU 50 (A) 07/26/2023 0621   HGBUR SMALL (A) 07/26/2023 0621   BILIRUBINUR NEGATIVE 07/26/2023 0621   KETONESUR NEGATIVE 07/26/2023 0621   PROTEINUR 30 (A) 07/26/2023 0621   UROBILINOGEN 0.2 05/05/2013 0633   NITRITE NEGATIVE  07/26/2023 0621   LEUKOCYTESUR NEGATIVE 07/26/2023 0621   Sepsis Labs: Invalid input(s): PROCALCITONIN, LACTICIDVEN  Microbiology: Recent Results (from the past 240 hours)  Resp panel by RT-PCR (RSV, Flu A&B, Covid) Anterior Nasal Swab     Status: None   Collection Time: 07/26/23  5:32 AM   Specimen: Anterior Nasal Swab  Result Value Ref Range Status   SARS Coronavirus 2 by RT PCR NEGATIVE NEGATIVE Final   Influenza A by PCR NEGATIVE NEGATIVE Final   Influenza B by PCR NEGATIVE NEGATIVE Final    Comment: (NOTE) The Xpert Xpress SARS-CoV-2/FLU/RSV plus assay is intended as an aid in the diagnosis of influenza from Nasopharyngeal swab specimens and should not be used as a sole basis for treatment. Nasal washings and aspirates are unacceptable for Xpert Xpress SARS-CoV-2/FLU/RSV testing.  Fact Sheet for Patients: BloggerCourse.com  Fact Sheet for Healthcare Providers: SeriousBroker.it  This test is not yet approved or  cleared by the [place]  FDA and has been authorized for detection and/or diagnosis of SARS-CoV-2 by FDA under an Emergency Use Authorization (EUA). This EUA will remain in effect (meaning this test can be used) for the duration of the COVID-19 declaration under Section 564(b)(1) of the Act, 21 U.S.C. section 360bbb-3(b)(1), unless the authorization is terminated or revoked.     Resp Syncytial Virus by PCR NEGATIVE NEGATIVE Final    Comment: (NOTE) Fact Sheet for Patients: BloggerCourse.com  Fact Sheet for Healthcare Providers: SeriousBroker.it  This test is not yet approved or cleared by the [place]  FDA and has been authorized for detection and/or diagnosis of SARS-CoV-2 by FDA under an Emergency Use Authorization (EUA). This EUA will remain in effect (meaning this test can be used) for the duration of the COVID-19 declaration under Section 564(b)(1) of the Act, 21 U.S.C. section 360bbb-3(b)(1), unless the authorization is terminated or revoked.  Performed at [facility], [address] [place], [place] [address]   Culture, blood (Routine X 2) w Reflex to ID Panel     Status: None   Collection Time: 07/26/23  5:24 PM   Specimen: BLOOD LEFT HAND  Result Value Ref Range Status   Specimen Description BLOOD LEFT HAND  Final   Special Requests   Final    BOTTLES DRAWN AEROBIC AND ANAEROBIC Blood Culture results may not be optimal due to an inadequate volume of blood received in culture bottles   Culture   Final    NO GROWTH 5 DAYS Performed at [facility], [address], [place], [place] [address]    Report Status 07/31/2023 FINAL  Final  Culture, blood (Routine X 2) w Reflex to ID Panel     Status: None   Collection Time: 07/26/23  5:26 PM   Specimen: BLOOD RIGHT HAND  Result Value Ref Range Status   Specimen Description BLOOD RIGHT HAND  Final   Special Requests   Final    BOTTLES DRAWN AEROBIC AND  ANAEROBIC Blood Culture results may not be optimal due to an inadequate volume of blood received in culture bottles   Culture   Final    NO GROWTH 5 DAYS Performed at [facility], [address] [place], [place] [address]    Report Status 07/31/2023 FINAL  Final  Respiratory (~20 pathogens) panel by PCR     Status: None   Collection Time: 07/26/23  5:55 PM   Specimen: Nasopharyngeal Swab; Respiratory  Result Value Ref Range Status   Adenovirus NOT DETECTED  NOT DETECTED Final   Coronavirus 229E NOT DETECTED NOT DETECTED Final    Comment: (NOTE) The Coronavirus on the Respiratory Panel, DOES NOT test for the novel  Coronavirus (2019 nCoV)    Coronavirus HKU1 NOT DETECTED NOT DETECTED Final   Coronavirus NL63 NOT DETECTED NOT DETECTED Final   Coronavirus OC43 NOT DETECTED NOT DETECTED Final   Metapneumovirus NOT DETECTED NOT DETECTED Final   Rhinovirus / Enterovirus NOT DETECTED NOT DETECTED Final   Influenza A NOT DETECTED NOT DETECTED Final   Influenza B NOT DETECTED NOT DETECTED Final   Parainfluenza Virus 1 NOT DETECTED NOT DETECTED Final   Parainfluenza Virus 2 NOT DETECTED NOT DETECTED Final   Parainfluenza Virus 3 NOT DETECTED NOT DETECTED Final   Parainfluenza Virus 4 NOT DETECTED NOT DETECTED Final   Respiratory Syncytial Virus NOT DETECTED NOT DETECTED Final   Bordetella pertussis NOT DETECTED NOT DETECTED Final   Bordetella Parapertussis NOT DETECTED NOT DETECTED Final   Chlamydophila pneumoniae NOT DETECTED NOT DETECTED Final   Mycoplasma pneumoniae NOT DETECTED NOT DETECTED Final    Comment: Performed at [facility], [address] [place], [place] [address]  MRSA Next Gen by PCR, Nasal     Status: None   Collection Time: 07/27/23 12:40 AM   Specimen: Nasal Mucosa; Nasal Swab  Result Value Ref Range Status   MRSA by PCR Next Gen NOT DETECTED NOT DETECTED Final    Comment: (NOTE) The GeneXpert MRSA Assay (FDA approved for NASAL specimens only), is one  component of a comprehensive MRSA colonization surveillance program. It is not intended to diagnose MRSA infection nor to guide or monitor treatment for MRSA infections. Test performance is not FDA approved in patients less than 36 years old. Performed at [facility], [address], [place], [place] [address]     Radiology Studies: No results found.   Scheduled Meds:  apixaban   5 mg Oral BID   arformoterol   15 mcg Nebulization BID   atorvastatin   40 mg Oral Daily   budesonide  (PULMICORT ) nebulizer solution  0.5 mg Nebulization BID   escitalopram   10 mg Oral QHS   feeding supplement (GLUCERNA SHAKE)  237 mL Oral TID BM   ferrous sulfate   325 mg Oral Q breakfast   furosemide   40 mg Intravenous Daily   insulin  aspart  0-15 Units Subcutaneous TID WC   insulin  aspart  5 Units Subcutaneous TID WC   insulin  glargine-yfgn  25 Units Subcutaneous Daily   isosorbide  mononitrate  15 mg Oral Daily   methylPREDNISolone  (SOLU-MEDROL ) injection  80 mg Intravenous Q12H   QUEtiapine   12.5 mg Oral QHS   revefenacin   175 mcg Nebulization Daily   sodium chloride  flush  10-40 mL Intracatheter Q12H   sodium chloride  flush  3 mL Intravenous Q4H while awake   traZODone   225 mg Oral QHS   Continuous Infusions:  sodium chloride  75 mL/hr at 07/31/23 1637   ceFEPime  (MAXIPIME ) IV 2 g (08/02/23 0418)     LOS: 7 days   55 minutes with more than 50% spent in reviewing records, counseling patient/family and coordinating care.  Reyes VEAR Gaw, MD [facility] [email or web address] 08/02/2023, 2:50 PM

## 2023-08-02 NOTE — Progress Notes (Signed)
 NAME:  Anthony Zangara., MRN:  [id number], DOB:  [birth year], LOS: 7 ADMISSION DATE:  07/26/2023, CONSULTATION DATE:  07/29/23 REFERRING MD:  Rosario CHIEF COMPLAINT:  Hypoxia   History of Present Illness:  Anthony Judson. is a 73 y.o. male who has a PMH as below including but not limited to COPD on 3L O2, ILD, past COVID, reported CHF (though echo from 7/18 with normal EF, no diastolic dysfunction. Echo from 2023 similar), PAF on Apixaban , PAD s/p L AKA. He resides at [facility].  He was admitted 07/26/23 after a fall and was found to have dyspnea and hypoxia. He had CT chest that demonstrated known emphysema along with bilateral lower lobe and RML consolidations. RVP was negative. He was started on CTX and Azithromycin .  Since admission, he had increasing O2 requirements. Azithromycin  had been stopped after 3 days. On 7/21, he was up to 100% FiO2 and 45L/min HHFNC. PCCM was subsequently asked to see in consultation for any additional recommendations. He has been diuresed and is -4L since admit. BNP from 7/20 was 245. He has been on Solumedrol 80mg  q12hrs since admit.  Pertinent  Medical History:  has CAD (coronary artery disease); Hyperlipidemia associated with type 2 diabetes mellitus (HCC); COPD (chronic obstructive pulmonary disease) (HCC); Tobacco abuse; Mood disorder (HCC); Bilateral inguinal hernia; Umbilical hernia; S/P bilateral inguinal hernia repair; Chronic right hip pain; Recurrent left inguinal hernia; S/P right THA, AA; Moderate episode of recurrent major depressive disorder (HCC); Rectal bleeding; Obesity (BMI 30-39.9); DM2 (diabetes mellitus, type 2) (HCC); Pain medication agreement signed; Aortic atherosclerosis (HCC); Insomnia; Multiple lung nodules; Chronic fatigue; Generalized weakness; Vitamin D  deficiency; Elevated vitamin B12 level; Acute neutrophilia; Spinal stenosis of lumbar region with neurogenic claudication; Chronic constipation; Chronic pain of right knee;  Cerebellar ataxia in diseases classified elsewhere [facility]); Fall against object; Right flank pain; Chronic respiratory failure with hypoxia (HCC); Hypokalemia; Bipolar 1 disorder (HCC); Pressure injury of skin; Rib fractures; History of DVT (deep vein thrombosis); HTN (hypertension); Paroxysmal atrial fibrillation with RVR (HCC); ILD (interstitial lung disease) (HCC); Acute exacerbation of CHF (congestive heart failure) (HCC); Acute respiratory failure with hypoxemia (HCC); and Community acquired pneumonia on their problem list.  Significant Hospital Events: Including procedures, antibiotic start and stop dates in addition to other pertinent events   7/18 admit 7/21 up to 45L and 100% FiO2. Back on azithromycin  in case this is legionella not getting full treatment course 7/22 frequent desaturations, back on bipap to try to re-recruit since desaturations worse while sleeping. Antibiotics escalated to cefepime .   Interim History / Subjective:   Remains critically ill on HHFNC 95% / 40 L Afebrile Sat up on edge of bed with PT, mild desaturation recovered quick  Objective:  Blood pressure 131/69, pulse 80, temperature 97.9 F (36.6 C), temperature source Oral, resp. rate 18, height 5' 9 (1.753 m), weight 80 kg, SpO2 98%.    FiO2 (%):  [95 %-100 %] 95 %   Intake/Output Summary (Last 24 hours) at 08/02/2023 1038 Last data filed at 08/02/2023 1000 Gross per 24 hour  Intake 240 ml  Output 4900 ml  Net -4660 ml   Filed Weights   07/31/23 0323 08/01/23 0625 08/02/23 0311  Weight: 82.3 kg 83.4 kg 80 kg    Physical Exam: General: Chronically ill-appearing man lying in bed no acute distress Neuro: Awake, interactive, poor memory HEENT: /AT, eyes anicteric Cardiovascular: S1-S2, regular rate and rhythm Lungs: No accessory muscle use, sat 92%, crackles right  base Abdomen: Obese, soft, nondistended Musculoskeletal: Left AKA, no significant edema  Labs show normal electrolytes,  hyperglycemia  Labs/imaging personally reviewed:  CT chest 7/18 > known emphysema along with bilateral lower lobe and RML consolidations.  Assessment & Plan:   Acute on chronic hypoxic respiratory failure - multifactorial with known CPFE (combined pulmonary fibrosis emphysema] and now superimposed bilateral PNA. Highly doubt PE as he is on Apixaban  and resides at SNF so unlikely to have missed doses.  Urine strep/Legionella antigen negative  -Continue cefepime  to complete 7 days, can DC azithromycin  -Continue IV Solu-Medrol  80 every 12 -BiPAP nightly and as needed as tolerated - Encourage vest therapy and hypertonic saline nebs to help with pulmonary clearance as I suspect shunt is part of his hypoxia. - Bronchodilators-Brovana  and Yupelri , DuoNeb as needed -Sputum culture has been a challenge to obtain, they have not been able to nasotracheally suction to assist with this -Continue efforts at weaning supplemental oxygen.  May have to transition back to salter at some point Expect slow progress here   DNR/DNI-- appropriate Palliative care assisting with goals of care   Rest of care per primary team.  Best practice (evaluated daily):  Per primary team.  Labs   CBC: Recent Labs  Lab 07/27/23 0721 07/29/23 0234  WBC 15.6* 17.5*  NEUTROABS 14.6* 16.3*  HGB 9.9* 9.6*  HCT 35.3* 34.7*  MCV 70.9* 70.2*  PLT 377 366    Basic Metabolic Panel: Recent Labs  Lab 07/27/23 0721 07/29/23 0234 07/30/23 0228 07/31/23 0742 08/01/23 0805  NA 139  139 137 138 134* 135  K 4.4  4.4 4.5 3.9 4.5 4.9  CL 97*  98 101 98 98 101  CO2 24  24 22 27 27 27   GLUCOSE 172*  173* 249* 183* 280* 238*  BUN 32*  31* 48* 47* 52* 37*  CREATININE 1.46*  1.45* 1.21 1.30* 1.20 0.97  CALCIUM  9.0  9.1 9.3 9.5 8.9 8.7*  MG 2.0  --  1.8  --   --   PHOS 4.0  4.1 3.6 4.9*  --   --    GFR: Estimated Creatinine Clearance: 66.8 mL/min (by C-G formula based on SCr of 0.97 mg/dL). Recent Labs  Lab  07/27/23 0721 07/29/23 0234  PROCALCITON 1.25  --   WBC 15.6* 17.5*    Liver Function Tests: Recent Labs  Lab 07/27/23 0721 07/29/23 0234 07/30/23 0228  ALBUMIN  3.2* 3.2* 3.2*     Anthony [name] V. Jude  08/02/23 10:38 AM [facility]  For contact information, see Amion. If no response to pager, please call PCCM consult pager. After hours, 7PM- 7AM, please call Elink.

## 2023-08-02 NOTE — Consult Note (Addendum)
 Cardiology Consultation   Patient ID: Anthony [name]. MRN: [id number]; DOB: 28-Mar-1950  Admit date: 07/26/2023 Date of Consult: 08/02/2023  PCP:  No primary care provider on file.   [name] Providers Cardiologist:  Anthony Cash, MD     Patient Profile: Anthony [name]. is a 73 y.o. male with a hx of CAD s/p inferior STEMI 06/2010 treated with BMS to the mid RCA, had moderate nonobstructive disease in the LAD, inferior hypokinesis EF 50%, paroxysmal atrial fibrillation, history of recurrent DVT in 2008 & 2014, COPD on chronic 3 L, ILD, PAD s/p left AKA, tobacco abuse, HLD, bipolar disorder who is being seen 08/02/2023 for the evaluation of atrial fibrillation with RVR at the request of Dr. Elpidio.  History of Present Illness: [name] has past medical history as stated above. He [name] on 07/[facility]/2023 with respiratory distress.  EMS was activated from the SNF after a reported fall.  They noted severe hypoxia, SpO2 originally in the 70s, improving with placement of a nonrebreather.  Patient was unable to provide much history due to being altered upon arrival to the emergency department.  It was reported that the patient was intermittently responsive but not really able to voice any complaints.  He has been admitted since 07/26/2023 for acute on chronic hypoxic respiratory failure, COPD exacerbation.  Pulmonology/critical care was consulted on 8/21 due to rising oxygen requirements.  At that time he was started on steroid, antibiotic.  He is also been treated for a possible CHF exacerbation.  He has been on IV Lasix , most recently on 40 mg daily.  He underwent therapeutic echocardiogram this admission, which showed: Normal to mildly decreased LV function, no LV RWMA, reduced RV systolic function, trivial MR, dilated IVC.  Previous echocardiogram from February 2023 showed EF 60 to 65%, normal RV function, normal IVC.  Pulmonary/critical care has  been closely involved, most recently they have the patient continued on antibiotics, steroids, BiPAP nightly as well as bronchodilators.  Primary team has initiated palliative care consult to have goals of care conversation.  The patient originally stated that he does not have any family involved in his care.  They have since found out that he has a daughter who is his POA.  Cardiology was consulted in the setting of A. Fib/flutter with RVR.  This patient was last seen by Dr. Cash in December 2023 as a follow-up for CAD/atrial fibrillation.  At this appointment he denies any chest pain, dyspnea, palpitations, reported he was feeling overall fairly well.  His treatment plan included Toprol  12.5 mg daily, Imdur  15 mg daily, Lipitor 40 mg daily, Eliquis  5 mg BID, spironolactone 25 mg PRN for swelling.   After speaking with the patient, he tells me he is overall feeling okay.  There is no family present in the room.  When I ask him about events that led to him being in the hospital, he tells me he cannot remember much.  He does know where he is and seems to understand a fair amount regarding his condition. He states that he feels like his breathing is doing much better since he got here.  He denies any prior or current chest pain, palpitations.   Past Medical History:  Diagnosis Date   Anxiety    Arthritis    Atrial fibrillation (HCC)    Atrial flutter (HCC)    Bipolar disorder (HCC)    CAD (coronary artery disease)    a.  INF STEMI 07/04/10:  tx with thrombectomy + [facility] BMS to [facility];  cath 07/04/10: dLM 10-20%, pLAD 40-50%, mLAD 20-30%, pRCA 30%, mRCA occluded and tx with PCI, EF 50% with inf HK. A Multilink   COPD (chronic obstructive pulmonary disease) (HCC)    Diabetes mellitus (HCC)    History of DVT (deep vein thrombosis)    HLD (hyperlipidemia)    Hypertension    Left above-knee amputee (HCC)    PAD (peripheral artery disease) (HCC)    Polysubstance abuse (HCC)    Prior hx   Tobacco  abuse    Urinary frequency    Vitamin D  deficiency 09/05/2018   Past Surgical History:  Procedure Laterality Date   CARDIAC CATHETERIZATION     COLONOSCOPY N/A 12/29/2015   Procedure: COLONOSCOPY;  Surgeon: Anthony Anthony Rivet, MD;  [facility];  Service: Endoscopy;  Laterality: N/A;  12:55   ELECTROCARDIOGRAM     Showed inferolateral ST-elevation and a code STEMI was activated. In the ER, he was treated with morphine , herarin, and 600 mg of Plavix. He was transferred emergently to [facility].    INGUINAL HERNIA REPAIR Left 07/26/2014   Procedure: OPEN REPAIR OF RECURENT LEFT INGUINAL HERNIA REPAIR WITH MESH;  Surgeon: Anthony Blush, MD;  Location: [facility];  Service: General;  Laterality: Left;   INSERTION OF MESH Bilateral 10/30/2013   Procedure: INSERTION OF MESH;  Surgeon: Anthony Anthony Blush, MD;  Location: [facility];  Service: General;  Laterality: Bilateral;   LAPAROSCOPIC INGUINAL HERNIA WITH UMBILICAL HERNIA Bilateral 10/30/2013   Procedure: laparoscopic repair left pantaloom hernia with mesh, laparoscopic right inguinal hernia with mesh, OPEN REPAIR OF UMBILICAL HERNIA ;  Surgeon: Anthony Anthony Blush, MD;  Location: [facility];  Service: General;  Laterality: Bilateral;   LAPAROSCOPY N/A 07/26/2014   Procedure: LAPAROSCOPY DIAGNOSTIC;  Surgeon: Anthony Blush, MD;  Location: [facility];  Service: General;  Laterality: N/A;   POLYPECTOMY  12/29/2015   Procedure: POLYPECTOMY;  Surgeon: Anthony Anthony Rivet, MD;  [facility];  Service: Endoscopy;;  ascending colon, descending colon   stent placement     TOTAL HIP ARTHROPLASTY Right 05/10/2015   Procedure: RIGHT TOTAL HIP ARTHROPLASTY ANTERIOR APPROACH;  Surgeon: Anthony Car, MD;  Location: [facility];  Service: Orthopedics;  Laterality: Right;   VASECTOMY      Home Medications:  Prior to Admission medications   Medication Sig Start Date End Date Taking? Authorizing Provider  acetaminophen  (TYLENOL ) 500 MG tablet Take 1,000 mg by mouth in the  morning, at noon, and at bedtime.   Yes [provider]  apixaban  (ELIQUIS ) 5 MG TABS tablet Take 1 tablet (5 mg total) by mouth 2 (two) times daily. 03/03/21  Yes [name], MD  atorvastatin  (LIPITOR) 40 MG tablet Take 1 tablet (40 mg total) by mouth daily. 12/22/18  Yes Alexander, Natalie, DO  Baclofen  5 MG TABS Take 5 mg by mouth in the morning and at bedtime.   Yes [provider]  diphenhydrAMINE -Phenylephrine  12.5-5 MG/5ML SOLN Take 10 mLs by mouth in the morning and at bedtime.   Yes [provider]  escitalopram  (LEXAPRO ) 10 MG tablet Take 1 tablet (10 mg total) by mouth at bedtime. 03/03/21  Yes [name], MD  eszopiclone (LUNESTA) 2 MG TABS tablet Take 2 mg by mouth at bedtime as needed. 07/04/23  Yes [provider]  gabapentin  (NEURONTIN ) 100 MG capsule Take 100 mg by mouth 2 (two) times daily.  Yes [provider]  glipiZIDE  (GLUCOTROL ) 10 MG tablet Take 1 tablet (10 mg total) by mouth 2 (two) times daily before a meal. Patient taking differently: Take 10 mg by mouth daily before breakfast. 12/22/18  Yes Alexander, Natalie, DO  guaiFENesin  (MUCINEX ) 600 MG 12 hr tablet Take 600 mg by mouth 2 (two) times daily.   Yes [provider]  ipratropium-albuterol  (DUONEB) 0.5-2.5 (3) MG/3ML SOLN Take 3 mLs by nebulization 3 (three) times daily.   Yes [provider]  isosorbide  mononitrate (IMDUR ) 30 MG 24 hr tablet Take 15 mg by mouth daily.   Yes [provider]  Melatonin 10 MG TABS Take 10 mg by mouth at bedtime.   Yes [provider]  metFORMIN  (GLUCOPHAGE -XR) 500 MG 24 hr tablet TAKE 2 TABLETS BY MOUTH  DAILY WITH BREAKFAST Patient taking differently: Take 1,000 mg by mouth in the morning and at bedtime. 01/18/20  Yes [name], DO  metoprolol  succinate (TOPROL -XL) 25 MG 24 hr tablet Take 12.5 mg by mouth daily.   Yes [provider]  nystatin ointment (MYCOSTATIN) Apply 1  Application topically 2 (two) times daily. Apply thin layer to groin area   Yes [provider]  Olopatadine HCl (PATADAY) 0.2 % SOLN Place 1 drop into both eyes daily.   Yes [provider]  Polyethyl Glycol-Propyl Glycol (SYSTANE) 0.4-0.3 % SOLN Place 1 drop into both eyes as needed (for dryness and irritation).   Yes [provider]  polyethylene glycol (MIRALAX  / GLYCOLAX ) 17 g packet Take 17 g by mouth every other day.   Yes [provider]  predniSONE  (DELTASONE ) 20 MG tablet Take 20 mg by mouth daily with breakfast. For wheezing from 7/16 to 7/19   Yes [provider]  Semaglutide  7 MG TABS Take 7 mg by mouth daily.   Yes [provider]  sodium chloride  (OCEAN) 0.65 % nasal spray Place 1 spray into the nose 3 (three) times daily.   Yes [provider]  Tiotropium Bromide-Olodaterol (STIOLTO RESPIMAT ) 2.5-2.5 MCG/ACT AERS Inhale 2 puffs into the lungs daily. 03/28/21  Yes [name], MD  traZODone  (DESYREL ) 100 MG tablet Take 200 mg by mouth at bedtime. 12/11/21  Yes [provider]  traZODone  (DESYREL ) 50 MG tablet Take 25 mg by mouth at bedtime. Take along with 200mg  for a dose of 225mg  nightly 07/08/23  Yes [provider]  albuterol  (VENTOLIN  HFA) 108 (90 Base) MCG/ACT inhaler Inhale 2 puffs into the lungs in the morning, at noon, and at bedtime. Patient not taking: Reported on 07/26/2023    [provider]  blood glucose meter kit and supplies KIT Dispense based on patient and insurance preference. Use to check BG once daily or every other other.  Dx: E11.9 07/17/16   Lavell Bari LABOR, FNP  Menthol , Topical Analgesic, (BIOFREEZE COOL THE PAIN) 4 % GEL Apply 1 application  topically 2 (two) times daily as needed (for painful joints). Patient not taking: Reported on 07/26/2023    [provider]   Scheduled Meds:  apixaban   5 mg Oral BID   arformoterol   15 mcg Nebulization BID   atorvastatin    40 mg Oral Daily   budesonide  (PULMICORT ) nebulizer solution  0.5 mg Nebulization BID   escitalopram   10 mg Oral QHS   feeding supplement (GLUCERNA SHAKE)  237 mL Oral TID BM   ferrous sulfate   325 mg Oral Q breakfast   furosemide   40 mg Intravenous Daily  insulin  aspart  0-15 Units Subcutaneous TID WC   insulin  aspart  5 Units Subcutaneous TID WC   insulin  glargine-yfgn  25 Units Subcutaneous Daily   isosorbide  mononitrate  15 mg Oral Daily   methylPREDNISolone  (SOLU-MEDROL ) injection  80 mg Intravenous Q12H   QUEtiapine   12.5 mg Oral QHS   revefenacin   175 mcg Nebulization Daily   sodium chloride  flush  10-40 mL Intracatheter Q12H   sodium chloride  flush  3 mL Intravenous Q4H while awake   traZODone   225 mg Oral QHS   Continuous Infusions:  sodium chloride  75 mL/hr at 07/31/23 1637   ceFEPime  (MAXIPIME ) IV 2 g (08/02/23 0418)   PRN Meds: acetaminophen , albuterol , artificial tears, melatonin, metoprolol  tartrate, morphine  injection, ondansetron  (ZOFRAN ) IV, polyethylene glycol, sodium chloride  flush  Allergies:    Allergies  Allergen Reactions   Flomax [Tamsulosin Hcl] Other (See Comments)    This medication makes patient feel sick   Penicillins Other (See Comments)    Reaction unknown occurred during childhood Has patient had a PCN reaction causing immediate rash, facial/tongue/throat swelling, SOB or lightheadedness with hypotension: unsure - childhood reaction Has patient had a PCN reaction causing severe rash involving mucus membranes or skin necrosis: unsure - childhood reaction Has patient had a PCN reaction that required hospitalization no Has patient had a PCN reaction occurring within the last 10 years: no If all of the above answers are NO, then may p   Social History:   Social History   Socioeconomic History   Marital status: Divorced    Spouse name: Not on file   Number of children: Not on file   Years of education: Not on file   Highest education level:  Not on file  Occupational History   Not on file  Tobacco Use   Smoking status: Former    Current packs/day: 1.50    Average packs/day: 1.5 packs/day for 45.0 years (67.5 ttl pk-yrs)    Types: Cigarettes   Smokeless tobacco: Never   Tobacco comments:    Pt states quitting 7 months ago 07/04/24 ARJ   Vaping Use   Vaping status: Never Used  Substance and Sexual Activity   Alcohol  use: No    Comment: past hx of ETOH use was in inpt facility per court order  - 20 years, 10-18-2015 per pt not anymore,per pt stopped about 15-20 yrs ago   Drug use: No    Types: Marijuana, Heroin, Cocaine    Comment: snorted heroin and cocaine, 10-18-2015 per pt in the past he did Marijuana only and nothing else   Sexual activity: Not Currently    Partners: Female  Other Topics Concern   Not on file  Social History Narrative   Lives in [place]. He lives alone. He has kids but is not married.    Social Drivers of Corporate investment banker Strain: Low Risk  (09/15/2020)   Received from [facility]   Overall Financial Resource Strain (CARDIA)    Difficulty of Paying Living Expenses: Not hard at all  Food Insecurity: Patient Unable To Answer (07/26/2023)   Hunger Vital Sign    Worried About Running Out of Food in the Last Year: Patient unable to answer    Ran Out of Food in the Last Year: Patient unable to answer  Transportation Needs: Patient Unable To Answer (07/26/2023)   PRAPARE - Transportation    Lack of Transportation (Medical): Patient unable to answer    Lack of Transportation (Non-Medical): Patient unable to  answer  Physical Activity: Not on file  Stress: No Stress Concern Present (09/15/2020)   Received from [facility] - Occupational Stress Questionnaire    Feeling of Stress : Only a little  Social Connections: Patient Unable To Answer (07/26/2023)   Social Connection and Isolation Panel    Frequency of Communication with Friends and Family:  Patient unable to answer    Frequency of Social Gatherings with Friends and Family: Patient unable to answer    Attends Religious Services: Patient unable to answer    Active Member of Clubs or Organizations: Patient unable to answer    Attends Banker Meetings: Patient unable to answer    Marital Status: Patient unable to answer  Intimate Partner Violence: Patient Unable To Answer (07/26/2023)   Humiliation, Afraid, Rape, and Kick questionnaire    Fear of Current or Ex-Partner: Patient unable to answer    Emotionally Abused: Patient unable to answer    Physically Abused: Patient unable to answer    Sexually Abused: Patient unable to answer    Family History:   Family History  Problem Relation Age of Onset   Coronary artery disease Father    Depression Father    Heart attack Father    Depression Mother     ROS:  Please see the history of present illness.  All other ROS reviewed and negative.     Physical Exam/Data: Vitals:   08/02/23 0744 08/02/23 0810 08/02/23 1100 08/02/23 1118  BP:  131/69  96/67  Pulse:  80 96 86  Resp:  18 (!) 23 20  Temp:  97.9 F (36.6 C)  98.5 F (36.9 C)  TempSrc:  Oral  Oral  SpO2: 90% 98% (!) 85% 90%  Weight:      Height:       Intake/Output Summary (Last 24 hours) at 08/02/2023 1448 Last data filed at 08/02/2023 1325 Gross per 24 hour  Intake 480 ml  Output 5150 ml  Net -4670 ml      08/02/2023    3:11 AM 08/01/2023    6:25 AM 07/31/2023    3:23 AM  Last 3 Weights  Weight (lbs) 176 lb 5.9 oz 183 lb 13.8 oz 181 lb 7 oz  Weight (kg) 80 kg 83.4 kg 82.3 kg     Body mass index is 26.05 kg/m.   General:  chronically ill-appearing male, no acute distress, on 40 L HHFNC HEENT: normal Vascular: Distal pulses 2+ bilaterally Cardiac:  irregular rhythm; no murmur  Lungs:  no increased respiratory effort, mild crackles at bases   Abd: soft, nontender, no hepatomegaly  Ext: no edema Musculoskeletal:  left AKA  Skin: warm and dry   Neuro:  alert and oriented  Psych:  Normal affect   Telemetry:  Telemetry was personally reviewed and demonstrates: Rate controlled atrial flutter, HR 70-80s  Relevant CV Studies:  Echocardiogram, 07/26/2023  Difficult acoustic windows Overall LV function is normal to mildly decreased Would recomm limited imaging with Definity to evaluate regional wall motion and overall LV function. . The left ventricle has no regional wall motion abnormalities. Left ventricular diastolic parameters were normal. Right ventricular systolic function reduced. The right ventricular size is normal.  The mitral valve is normal in structure. Trivial mitral valve regurgitation.  The aortic valve is tricuspid. Aortic valve regurgitation is not visualized. Aortic valve sclerosis/ calcification is present, without any evidence of aortic stenosis.  The inferior vena cava is dilated in  size with < 50% respiratory variability, suggesting right atrial pressure of 15 mmHg.  Laboratory Data: High Sensitivity Troponin:   Recent Labs  Lab 07/26/23 0210 07/26/23 0342  TROPONINIHS 58* 59*     Chemistry Recent Labs  Lab 07/27/23 0721 07/29/23 0234 07/30/23 0228 07/31/23 0742 08/01/23 0805  NA 139  139   < > 138 134* 135  K 4.4  4.4   < > 3.9 4.5 4.9  CL 97*  98   < > 98 98 101  CO2 24  24   < > 27 27 27   GLUCOSE 172*  173*   < > 183* 280* 238*  BUN 32*  31*   < > 47* 52* 37*  CREATININE 1.46*  1.45*   < > 1.30* 1.20 0.97  CALCIUM  9.0  9.1   < > 9.5 8.9 8.7*  MG 2.0  --  1.8  --   --   GFRNONAA 50*  51*   < > 58* >60 >60  ANIONGAP 18*  17*   < > 13 9 7    < > = values in this interval not displayed.    Recent Labs  Lab 07/27/23 0721 07/29/23 0234 07/30/23 0228  ALBUMIN  3.2* 3.2* 3.2*   Lipids  Recent Labs  Lab 07/27/23 0721  CHOL 97  TRIG 67  HDL 44  LDLCALC 40  CHOLHDL 2.2    Hematology Recent Labs  Lab 07/27/23 0721 07/29/23 0234  WBC 15.6* 17.5*  RBC 4.98 4.94  HGB 9.9* 9.6*   HCT 35.3* 34.7*  MCV 70.9* 70.2*  MCH 19.9* 19.4*  MCHC 28.0* 27.7*  RDW 19.7* 19.5*  PLT 377 366   Thyroid   Recent Labs  Lab 07/27/23 0721  TSH 0.770    BNP Recent Labs  Lab 07/29/23 0234  BNP 245.2*    DDimer No results for input(s): DDIMER in the last 168 hours.  Radiology/Studies:  DG CHEST PORT 1 VIEW Result Date: 07/30/2023 CLINICAL DATA:  200808 Hypoxia [id number] EXAM: PORTABLE CHEST 1 VIEW COMPARISON:  Chest x-ray 07/29/2023, CT angiography 02/12/2021 FINDINGS: The heart and mediastinal contours are unchanged. Atherosclerotic plaque. Biapical pleural/pulmonary scarring. Persistent patchy airspace opacity of the bilateral, right greater than left, lower lobes. Chronic coarsened interstitial markings with no overt pulmonary edema. No pleural effusion. No pneumothorax. No acute osseous abnormality. IMPRESSION: 1. Persistent patchy airspace opacity of the bilateral, right greater than left, lower lobes. Question multifocal pneumonia. Followup PA and lateral chest X-ray is recommended in 3-4 weeks following therapy to ensure resolution and exclude underlying malignancy. 2. Aortic Atherosclerosis (ICD10-I70.0) and Emphysema (ICD10-J43.9). Electronically Signed   By: Morgane  Naveau M.D.   On: 07/30/2023 13:32   Assessment and Plan:  Paroxysmal atrial fibrillation/flutter with RVR  Remote post op atrial fibrillation/flutter  Noted to be in atrial fib/flutter this admission EKG from various dates shows A. Fib with RVR, HR 120s  Home meds: Toprol  12.5 mg daily, Eliquis  5 mg BID Currently in atrial flutter with HR 70-80s  Patient denies any chest pain, shortness of breath, palpitations  Currently holding home beta-blockers  Continue Eliquis  5 mg BID  With history of CAD, chronic lung disease, and current state of health, would not be a good candidate for alternative antiarrhythmics Could continue with focus on rate-control and add back home Toprol  dose if needed, as  tolerated Patient to be seen by palliative care sometime today   Acute on chronic HFpEF Presented with acute on chronic respiratory after sustaining a  fall  BNP 453 > 245 CXR show possible multifocal PNA  Home meds: Toprol  12.5 mg daily, Imdur  15 mg daily Receiving IV Lasix  while inpatient Net -8.7 L this admission  Most recent BP 96/67, HR 80s Creatinine has been trending down, 0.97 today  Currently on IV Lasix  40 mg daily, Imdur  15 mg daily  CAD s/p STEMI 2012 with stent to RCA Denies any active or prior chest pain  EKG showed  Troponin 58 > 59  Home meds: Toprol  12.5 mg daily, Lipitor 40 mg daily No ASA with chronic Eliquis  use   Hyperlipidemia 07/26/2023: ALT 14 07/27/2023: HDL 44; LDL Cholesterol 40  Home meds: Lipitor 40 mg daily Continue statin   Per primary Acute hypoxic respiratory failure Multifocal pneumonia COPD exacerbation Interstitial lung AKI Anemia History of PAD s/p left AKA Acute encephalopathy Diabetes Leukocytosis Goals of care  Risk Assessment/Risk Scores:      New [place]  Heart Association (NYHA) Functional Class NYHA Class II  CHA2DS2-VASc Score = 5   This indicates a 7.2% annual risk of stroke. The patient's score is based upon: CHF History: 1 HTN History: 1 Diabetes History: 1 Stroke History: 0 Vascular Disease History: 1 Age Score: 1 Gender Score: 0        For questions or updates, please contact [facility] Please consult [email or web address] for contact info under    Signed, [name], PA-C  08/02/2023 2:48 PM

## 2023-08-02 NOTE — Progress Notes (Signed)
 Mobility Specialist Progress Note;   08/02/23 0937  Mobility  Activity Transferred from chair to bed  Level of Assistance Contact guard assist, steadying assist  Assistive Device Front wheel walker  Distance Ambulated (ft) 5 ft  Activity Response Tolerated well  Mobility visit 1 Mobility  Mobility Specialist Start Time (ACUTE ONLY) [id number]  Mobility Specialist Stop Time (ACUTE ONLY) 0942  Mobility Specialist Time Calculation (min) (ACUTE ONLY) 5 min   Nursing requesting assistance transferring pt back to bed. Required MinG +2 for safety. HR up to 128 bpm w/ activity. Pt left comfortably in bed with all needs met. NT in room.   Lauraine Erm Mobility Specialist Please contact via SecureChat or [employer] [phone]

## 2023-08-02 NOTE — Progress Notes (Signed)
 Physical Therapy Treatment Patient Details Name: Anthony [name]. MRN: [id number] DOB: 10-03-1950 Today's Date: 08/02/2023   History of Present Illness 73 y.o. male presents to [facility] 07/26/23 from [facility] after sliding off the bed with AMS and hypoxia. Admitted with acute hypoxic respiratory failure, PNA, and acute encephalopathy. PMHx: Cognitive deficit, ILD, COPD, CAD with hx inferior STEMI, RCA PCI, diastolic HF, paroxysmal A-fib, DVT, on anticoagulation, PAD with L AKA. diabetes type 2, hyperlipidemia.    PT Comments  Pt continues to progress towards goals. Pt is currently Min A +2 for sit to stand, CGA for sitting to supine bed mobility, Min A +2 for non-functional gait with increased time. Pt has difficulty with coordination and body mechanics with significant generalized weakness and balance deficits. Due to pt current functional status, home set up and available assistance at home recommending skilled physical therapy services < 3 hours/day in order to address strength, balance and functional mobility to decrease risk for falls, injury, immobility, skin break down and re-hospitalization.      If plan is discharge home, recommend the following: Assistance with cooking/housework;Assist for transportation;Help with stairs or ramp for entrance;Two people to help with walking and/or transfers;Supervision due to cognitive status   Can travel by private vehicle     No  Equipment Recommendations  None recommended by PT       Precautions / Restrictions Precautions Precautions: Fall Recall of Precautions/Restrictions: Impaired Precaution/Restrictions Comments: watch O2; impulsive and tends to remove [place] Restrictions Weight Bearing Restrictions Per Provider Order: No     Mobility  Bed Mobility Overal bed mobility: Needs Assistance Bed Mobility: Sit to Sidelying         Sit to sidelying: Contact guard assist, Used rails General bed mobility comments: CGA for safety     Transfers Overall transfer level: Needs assistance Equipment used: Rolling walker (2 wheels) Transfers: Sit to/from Stand Sit to Stand: Min assist, +2 safety/equipment           General transfer comment: Min A with a +2 for safety due to last treatment session. pt performed well with good re-call of hand placement ~50% after initial education. Pt performed 4x then scooted EOB at Close SBA.    Ambulation/Gait Ambulation/Gait assistance: Min assist, +2 physical assistance, +2 safety/equipment Gait Distance (Feet): 1 Feet Assistive device: Rolling walker (2 wheels) Gait Pattern/deviations: Step-to pattern Gait velocity: non-functional     General Gait Details: Attempted short distance ambulation at EOB. Pt difficulty side stepping performed forward steps with 2 person Min A for balance and safety with AD stabilization and support at turnk for COm over BOS with hop to gait pattern       Balance Overall balance assessment: Needs assistance, Mild deficits observed, not formally tested, History of Falls Sitting-balance support: Bilateral upper extremity supported, Feet supported Sitting balance-Leahy Scale: Fair     Standing balance support: Bilateral upper extremity supported, Reliant on assistive device for balance Standing balance-Leahy Scale: Poor Standing balance comment: Min A for external support          Communication Communication Communication: No apparent difficulties  Cognition Arousal: Alert Behavior During Therapy: WFL for tasks assessed/performed   PT - Cognitive impairments: History of cognitive impairments, Problem solving, Safety/Judgement, Attention       Following commands: Intact Following commands impaired: Follows one step commands with increased time, Only follows one step commands consistently    Cueing Cueing Techniques: Verbal cues, Gestural cues, Tactile cues     General  Comments General comments (skin integrity, edema, etc.): O2 sats  remained 87% and higher HR Max 133 bpm during activity      Pertinent Vitals/Pain Pain Assessment Pain Assessment: No/denies pain     PT Goals (current goals can now be found in the care plan section) Acute Rehab PT Goals Patient Stated Goal: to feel better PT Goal Formulation: With patient Time For Goal Achievement: 08/09/23 Potential to Achieve Goals: Good Progress towards PT goals: Progressing toward goals    Frequency    Min 2X/week      PT Plan  Continue with current POC        AM-PAC PT 6 Clicks Mobility   Outcome Measure  Help needed turning from your back to your side while in a flat bed without using bedrails?: A Little Help needed moving from lying on your back to sitting on the side of a flat bed without using bedrails?: A Lot Help needed moving to and from a bed to a chair (including a wheelchair)?: A Lot Help needed standing up from a chair using your arms (e.g., wheelchair or bedside chair)?: A Little Help needed to walk in hospital room?: Total Help needed climbing 3-5 steps with a railing? : Total 6 Click Score: 12    End of Session Equipment Utilized During Treatment: Oxygen Activity Tolerance: Patient tolerated treatment well Patient left: in bed;with call bell/phone within reach;with bed alarm set Nurse Communication: Mobility status PT Visit Diagnosis: Other abnormalities of gait and mobility (R26.89);Muscle weakness (generalized) (M62.81);History of falling (Z91.81)     Time: 9057-8997 PT Time Calculation (min) (ACUTE ONLY): 20 min  Charges:    $Therapeutic Activity: 8-22 mins PT General Charges $$ ACUTE PT VISIT: 1 Visit                    Dorothyann Maier, DPT, CLT  Acute Rehabilitation Services Office: [phone] (Secure chat preferred)    Dorothyann VEAR Maier 08/02/2023, 10:12 AM

## 2023-08-02 NOTE — TOC Progression Note (Signed)
 Transition of Care (TOC) - Progression Note    Patient Details  Name: Anthony [name]. MRN: [id number] Date of Birth: 26-Dec-1950  Transition of Care [facility] CM/SW Contact  Lauraine FORBES Saa, LCSW Phone Number: 08/02/2023, 2:48 PM  Clinical Narrative:     2:48 PM Per progressions, patient is not yet medically ready to discharge to [facility] as he remains on HHFNC. CSW will continue to follow and be available to assist.  Expected Discharge Plan: Long Term Nursing Home Barriers to Discharge: Continued Medical Work up               Expected Discharge Plan and Services In-house Referral: Clinical Social Work   Post Acute Care Choice: Skilled Nursing Facility Living arrangements for the past 2 months: Skilled Nursing Facility                                       Social Drivers of Health (SDOH) Interventions SDOH Screenings   Food Insecurity: Patient Unable To Answer (07/26/2023)  Housing: Patient Unable To Answer (07/26/2023)  Transportation Needs: Patient Unable To Answer (07/26/2023)  Utilities: Patient Unable To Answer (07/26/2023)  Depression (PHQ2-9): Medium Risk (11/27/2019)  Financial Resource Strain: Low Risk  (09/15/2020)   Received from [facility]  Social Connections: Patient Unable To Answer (07/26/2023)  Stress: No Stress Concern Present (09/15/2020)   Received from [facility]  Tobacco Use: Medium Risk (07/26/2023)    Readmission Risk Interventions    02/13/2021    3:47 PM  Readmission Risk Prevention Plan  Transportation Screening Complete  PCP or Specialist Appt within 5-7 Days Complete  Home Care Screening Complete  Medication Review (RN CM) Complete

## 2023-08-03 DIAGNOSIS — I5031 Acute diastolic (congestive) heart failure: Secondary | ICD-10-CM

## 2023-08-03 DIAGNOSIS — J8410 Pulmonary fibrosis, unspecified: Secondary | ICD-10-CM | POA: Diagnosis not present

## 2023-08-03 DIAGNOSIS — J9621 Acute and chronic respiratory failure with hypoxia: Secondary | ICD-10-CM | POA: Diagnosis not present

## 2023-08-03 DIAGNOSIS — I480 Paroxysmal atrial fibrillation: Secondary | ICD-10-CM

## 2023-08-03 DIAGNOSIS — I4891 Unspecified atrial fibrillation: Secondary | ICD-10-CM | POA: Diagnosis not present

## 2023-08-03 DIAGNOSIS — J439 Emphysema, unspecified: Secondary | ICD-10-CM | POA: Diagnosis not present

## 2023-08-03 LAB — BASIC METABOLIC PANEL WITH GFR
Anion gap: 9 (ref 5–15)
BUN: 38 mg/dL — ABNORMAL HIGH (ref 8–23)
CO2: 26 mmol/L (ref 22–32)
Calcium: 8.4 mg/dL — ABNORMAL LOW (ref 8.9–10.3)
Chloride: 100 mmol/L (ref 98–111)
Creatinine, Ser: 1.12 mg/dL (ref 0.61–1.24)
GFR, Estimated: >60 mL/min (ref >60)
Glucose, Bld: 368 mg/dL — ABNORMAL HIGH (ref 70–99)
Potassium: 4.9 mmol/L (ref 3.5–5.1)
Sodium: 135 mmol/L (ref 135–145)

## 2023-08-03 LAB — GLUCOSE, CAPILLARY
Glucose-Capillary: 302 mg/dL — ABNORMAL HIGH (ref 70–99)
Glucose-Capillary: 255 mg/dL — ABNORMAL HIGH (ref 70–99)
Glucose-Capillary: 271 mg/dL — ABNORMAL HIGH (ref 70–99)
Glucose-Capillary: 415 mg/dL — ABNORMAL HIGH (ref 70–99)

## 2023-08-03 LAB — CBC
HCT: 36.1 % — ABNORMAL LOW (ref 39.0–52.0)
Hemoglobin: 10 g/dL — ABNORMAL LOW (ref 13.0–17.0)
MCH: 20.2 pg — ABNORMAL LOW (ref 26.0–34.0)
MCHC: 27.7 g/dL — ABNORMAL LOW (ref 30.0–36.0)
MCV: 72.8 fL — ABNORMAL LOW (ref 80.0–100.0)
Platelets: 371 K/uL (ref 150–400)
RBC: 4.96 MIL/uL (ref 4.22–5.81)
RDW: 20.3 % — ABNORMAL HIGH (ref 11.5–15.5)
WBC: 22.8 K/uL — ABNORMAL HIGH (ref 4.0–10.5)
nRBC: 0 % (ref 0.0–0.2)

## 2023-08-03 LAB — MAGNESIUM: Magnesium: 2.1 mg/dL (ref 1.7–2.4)

## 2023-08-03 MED ORDER — INSULIN ASPART 100 UNIT/ML IJ SOLN
8.0000 [IU] | Freq: Once | INTRAMUSCULAR | Status: AC
  Administered 2023-08-03: 8 [IU] via SUBCUTANEOUS

## 2023-08-03 MED ORDER — FUROSEMIDE 20 MG PO TABS
20.0000 mg | ORAL_TABLET | Freq: Every day | ORAL | Status: DC
  Administered 2023-08-03 – 2023-08-08 (×6): 20 mg via ORAL
  Filled 2023-08-03 (×6): qty 1

## 2023-08-03 MED ORDER — INSULIN GLARGINE-YFGN 100 UNIT/ML ~~LOC~~ SOLN
35.0000 [IU] | Freq: Every day | SUBCUTANEOUS | Status: DC
  Administered 2023-08-03 – 2023-08-05 (×3): 35 [IU] via SUBCUTANEOUS
  Filled 2023-08-03 (×4): qty 0.35

## 2023-08-03 MED ORDER — METOPROLOL SUCCINATE ER 25 MG PO TB24
12.5000 mg | ORAL_TABLET | Freq: Every day | ORAL | Status: DC
  Administered 2023-08-03 – 2023-08-08 (×2): 12.5 mg via ORAL
  Filled 2023-08-03 (×4): qty 1

## 2023-08-03 NOTE — Plan of Care (Signed)
  Problem: Metabolic: Goal: Ability to maintain appropriate glucose levels will improve Outcome: Progressing   Problem: Skin Integrity: Goal: Risk for impaired skin integrity will decrease Outcome: Progressing   Problem: Tissue Perfusion: Goal: Adequacy of tissue perfusion will improve Outcome: Progressing   Problem: Clinical Measurements: Goal: Ability to maintain clinical measurements within normal limits will improve Outcome: Progressing Goal: Respiratory complications will improve Outcome: Progressing

## 2023-08-03 NOTE — Progress Notes (Signed)
 NAME:  Anthony Bruins., MRN:  [id number], DOB:  1950/01/19, LOS: 8 ADMISSION DATE:  07/26/2023, CONSULTATION DATE:  07/29/23 REFERRING MD:  Rosario CHIEF COMPLAINT:  Hypoxia   History of Present Illness:  Anthony Arpino. is a 73 y.o. male who has a PMH as below including but not limited to COPD on 3L O2, ILD, past COVID, reported CHF (though echo from 7/18 with normal EF, no diastolic dysfunction. Echo from 2023 similar), PAF on Apixaban , PAD s/p L AKA. He resides at [facility].  He was admitted 07/26/23 after a fall and was found to have dyspnea and hypoxia. He had CT chest that demonstrated known emphysema along with bilateral lower lobe and RML consolidations. RVP was negative. He was started on CTX and Azithromycin .  Since admission, he had increasing O2 requirements. Azithromycin  had been stopped after 3 days. On 7/21, he was up to 100% FiO2 and 45L/min HHFNC. PCCM was subsequently asked to see in consultation for any additional recommendations. He has been diuresed and is -4L since admit. BNP from 7/20 was 245. He has been on Solumedrol 80mg  q12hrs since admit.  Pertinent  Medical History:  has CAD (coronary artery disease); Hyperlipidemia associated with type 2 diabetes mellitus (HCC); COPD (chronic obstructive pulmonary disease) (HCC); Tobacco abuse; Mood disorder (HCC); Bilateral inguinal hernia; Umbilical hernia; S/P bilateral inguinal hernia repair; Chronic right hip pain; Recurrent left inguinal hernia; S/P right THA, AA; Moderate episode of recurrent major depressive disorder (HCC); Rectal bleeding; Obesity (BMI 30-39.9); DM2 (diabetes mellitus, type 2) (HCC); Pain medication agreement signed; Aortic atherosclerosis (HCC); Insomnia; Multiple lung nodules; Chronic fatigue; Generalized weakness; Vitamin D  deficiency; Elevated vitamin B12 level; Acute neutrophilia; Spinal stenosis of lumbar region with neurogenic claudication; Chronic constipation; Chronic pain of right knee;  Cerebellar ataxia in diseases classified elsewhere [facility]); Fall against object; Right flank pain; Chronic respiratory failure with hypoxia (HCC); Hypokalemia; Bipolar 1 disorder (HCC); Pressure injury of skin; Rib fractures; History of DVT (deep vein thrombosis); HTN (hypertension); Paroxysmal atrial fibrillation with RVR (HCC); ILD (interstitial lung disease) (HCC); Acute exacerbation of CHF (congestive heart failure) (HCC); Acute respiratory failure with hypoxemia (HCC); Community acquired pneumonia; Typical atrial flutter (HCC); and Paroxysmal atrial fibrillation (HCC) on their problem list.  Significant Hospital Events: Including procedures, antibiotic start and stop dates in addition to other pertinent events   7/18 admit 7/21 up to 45L and 100% FiO2. Back on azithromycin  in case this is legionella not getting full treatment course 7/22 frequent desaturations, back on bipap to try to re-recruit since desaturations worse while sleeping. Antibiotics escalated to cefepime .   Interim History / Subjective:   Remains critically ill on HHFNC 95% / 40 L On my rounds, cannula was off and saturations 83%. We tried him on salter cannula at 12 L but saturations only 85 to 86%  Objective:  Blood pressure 123/73, pulse 67, temperature 98 F (36.7 C), temperature source Oral, resp. rate 17, height 5' 9 (1.753 m), weight 78.1 kg, SpO2 98%.    FiO2 (%):  [94 %-100 %] 94 %   Intake/Output Summary (Last 24 hours) at 08/03/2023 1236 Last data filed at 08/03/2023 0802 Gross per 24 hour  Intake 600 ml  Output 2600 ml  Net -2000 ml   Filed Weights   08/01/23 0625 08/02/23 0311 08/03/23 0639  Weight: 83.4 kg 80 kg 78.1 kg    Physical Exam: General: Chronically ill-appearing man lying in bed no acute distress Neuro: Awake, interactive, poor memory ,  oriented to person and place, not to time HEENT: Anthony [name]/AT, eyes anicteric Cardiovascular: S1-S2, regular rate and rhythm Lungs: No accessory muscle use, sat  92%, crackles right base Abdomen: Obese, soft, nondistended Musculoskeletal: Left AKA, no significant edema  Labs show normal electrolytes, hyperglycemia  Labs/imaging personally reviewed:  CT chest 7/18 > known emphysema along with bilateral lower lobe and RML consolidations.  Assessment & Plan:   Acute on chronic hypoxic respiratory failure - multifactorial with known CPFE (combined pulmonary fibrosis emphysema] and now superimposed bilateral PNA. Highly doubt PE as he is on Apixaban  and resides at SNF so unlikely to have missed doses.  Urine strep/Legionella antigen negative  -Continue cefepime  to complete 7 days, off azithromycin   -Continue IV Solu-Medrol  80 every 12 -BiPAP nightly and as needed as tolerated - Encourage vest therapy and hypertonic saline nebs to help with pulmonary clearance  - Bronchodilators-Brovana  and Yupelri , DuoNeb as needed -Continue efforts at weaning supplemental oxygen.  May have to transition back to salter at some point Unfortunately minimal progress here   DNR/DNI-- appropriate Palliative care assisting with goals of care   We have him on maximum triple therapy nebs, antibiotics and steroids.  No escalation with DNR/DNI status.  Unfortunately with high oxygen requirements cannot move to a different care venue.  At the same time not clear whether this is terminal to advocate for hospice.  I hope is that worsening is related to pneumonia which may improve with time/1-2 more weeks ?  Best practice (evaluated daily):  Per primary team.  Labs   CBC: Recent Labs  Lab 07/29/23 0234 08/03/23 0748  WBC 17.5* 22.8*  NEUTROABS 16.3*  --   HGB 9.6* 10.0*  HCT 34.7* 36.1*  MCV 70.2* 72.8*  PLT 366 371    Basic Metabolic Panel: Recent Labs  Lab 07/29/23 0234 07/30/23 0228 07/31/23 0742 08/01/23 0805 08/03/23 0748  NA 137 138 134* 135 135  K 4.5 3.9 4.5 4.9 4.9  CL 101 98 98 101 100  CO2 22 27 27 27 26   GLUCOSE 249* 183* 280* 238* 368*   BUN 48* 47* 52* 37* 38*  CREATININE 1.21 1.30* 1.20 0.97 1.12  CALCIUM  9.3 9.5 8.9 8.7* 8.4*  MG  --  1.8  --   --  2.1  PHOS 3.6 4.9*  --   --   --    GFR: Estimated Creatinine Clearance: 57.9 mL/min (by C-G formula based on SCr of 1.12 mg/dL). Recent Labs  Lab 07/29/23 0234 08/03/23 0748  WBC 17.5* 22.8*    Liver Function Tests: Recent Labs  Lab 07/29/23 0234 07/30/23 0228  ALBUMIN  3.2* 3.2*     Shep Porter V. Jude  08/03/23 12:36 PM [facility]  For contact information, see Amion. If no response to pager, please call PCCM consult pager. After hours, 7PM- 7AM, please call Elink.

## 2023-08-03 NOTE — Progress Notes (Addendum)
 Spoke with respiratory therapy at 2225 regarding the vest physiotherapy not being done for patient in several days, pointing out that patient sounds like someone who really needs this treatment. Patient was present for conversation.  She said earlier patient had stated he did not like the vest; so she was using the flutter valve instead; however, the patient appeared confused by this. Patient does have intermittent confusion at times, worse in the evenings, so with respiratory therapy present, nurse asked the patient if he minds the vest therapy. Pointing to the vest in the room. Patient states I don't think I mind the vest; I don't recall trying it,    I again, stressed to RT I felt the patient would benefit from use of the actual vest vs flutter valve. Patient was willing to use the vest at this point.SABRA

## 2023-08-03 NOTE — Progress Notes (Signed)
 Palliative Medicine Progress Note   Patient Name: Anthony [name].       Date: 08/03/2023 DOB: 08-03-1950  Age: 73 y.o. MRN#: [id number] Attending Physician: Elpidio Reyes DEL, MD Primary Care Physician: No primary care provider on file. Admit Date: 07/26/2023  Reason for Consultation/Follow-up: {Reason for Consult:23484}  HPI/Patient Profile: 73 y.o. male  with past medical history of ILD, COPD, chronic respiratory failure on 3 L O2, prior COVID infection, coronary artery disease, reported CHF (though prior echo with normal EF and no diastolic dysfunction), paroxysmal atrial fibrillation, DVT, peripheral artery disease s/p left AKA, diabetes type 2, and hyperlipidemia.  He presented to the ED on 07/26/2023 after a fall and was found to have dyspnea and hypoxia.  CT chest showed known emphysema along with bilateral lower lobe and RML consolidations.  He is admitted with acute on chronic hypoxic respiratory failure; multifactorial due to COPD exacerbation and ILD along with superimposed bilateral pneumonia.  Since admission, he has had increasing O2 requirements.   Palliative Medicine has been consulted for goals of care discussions. Patient and family are faced with anticipatory care needs and complex medical decision making.     Subjective: Chart reviewed. Update received from RN. Slow progress to wean oxygen - currently on 40-45 L.   Patient assessed at bedside. He is currently sleeping deeply, does not awake to voice.   Objective:  Physical Exam Vitals reviewed.  Constitutional:      General: He is sleeping. He is not in acute distress.    Comments: Chronically ill-appearing  Cardiovascular:     Rate and Rhythm: Normal rate.  Pulmonary:     Effort: Pulmonary effort is normal.      Comments: Oxygen by HFNC            Vital Signs: BP 120/66   Pulse 67   Temp 98.4 F (36.9 C) (Oral)   Resp 17   Ht 5' 9 (1.753 m)   Wt 78.1 kg   SpO2 94%   BMI 25.43 kg/m  SpO2: SpO2: 94 % O2 Device: O2 Device: (S) Heated High Flow Nasal Cannula O2 Flow Rate: O2 Flow Rate (L/min): 40 L/min    Palliative Medicine Assessment & Plan   Assessment: Principal Problem:   Acute exacerbation of CHF (congestive heart failure) (HCC) Active Problems:  Acute respiratory failure with hypoxemia (HCC)   Community acquired pneumonia   Typical atrial flutter (HCC)   Paroxysmal atrial fibrillation (HCC)    Recommendations/Plan: ***  Goals of Care and Additional Recommendations: Limitations on Scope of Treatment: {Recommended Scope and Preferences:21019}  Code Status:   Prognosis:  {Palliative Care Prognosis:23504}  Discharge Planning: {Palliative dispostion:23505}  Care plan was discussed with ***  Thank you for allowing the Palliative Medicine Team to assist in the care of this patient.   ***   Recardo KATHEE Loll, NP   Please contact Palliative Medicine Team phone at [phone] for questions and concerns.  For individual providers, please see AMION.

## 2023-08-03 NOTE — Plan of Care (Signed)
   Problem: Fluid Volume: Goal: Ability to maintain a balanced intake and output will improve Outcome: Progressing   Problem: Nutritional: Goal: Maintenance of adequate nutrition will improve Outcome: Progressing

## 2023-08-03 NOTE — Progress Notes (Signed)
 Cardiology Progress Note  Patient ID: Anthony [name]. MRN: [id number] DOB: Oct 04, 1950 Date of Encounter: 08/03/2023 Primary Cardiologist: Lonni Cash, MD  Subjective   Chief Complaint: SOB  HPI: Afib versus NSR on tele. EKG pending.  Diffuse rhonchi noted on exam.  Symptoms of shortness of breath are still present.  Does not appear volume overloaded.  ROS:  All other ROS reviewed and negative. Pertinent positives noted in the HPI.     Telemetry  Overnight telemetry shows A-fib 70s versus sinus rhythm, which I personally reviewed.    Physical Exam   Vitals:   08/03/23 0639 08/03/23 0742 08/03/23 0743 08/03/23 0744  BP: 115/64   120/66  Pulse: 60 68  67  Resp: 14 (!) 21  17  Temp: 98.4 F (36.9 C)   98.4 F (36.9 C)  TempSrc: Oral   Oral  SpO2: 97% 91% (!) 87% 97%  Weight: 78.1 kg     Height:        Intake/Output Summary (Last 24 hours) at 08/03/2023 0910 Last data filed at 08/03/2023 0802 Gross per 24 hour  Intake 600 ml  Output 3800 ml  Net -3200 ml       08/03/2023    6:39 AM 08/02/2023    3:11 AM 08/01/2023    6:25 AM  Last 3 Weights  Weight (lbs) 172 lb 2.9 oz 176 lb 5.9 oz 183 lb 13.8 oz  Weight (kg) 78.1 kg 80 kg 83.4 kg    Body mass index is 25.43 kg/m.  General: Ill-appearing Head: Atraumatic, normal size  Eyes: PEERLA, EOMI  Neck: Supple, no JVD Endocrine: No thryomegaly Cardiac: Normal S1, S2; RRR; no murmurs, rubs, or gallops Lungs: Diffuse rhonchi bilaterally Abd: Soft, nontender, no hepatomegaly  Ext: No edema, pulses 2+ Musculoskeletal: No deformities, BUE and BLE strength normal and equal Skin: Warm and dry, no rashes   Neuro: Alert and oriented to person, place, time, and situation, CNII-XII grossly intact, no focal deficits  Psych: Normal mood and affect   Cardiac Studies  TTE 07/26/2023  1. Difficult acoustic windows Overall LV function is normal to mildly  decreased Would recomm limited imaging with Definity to evaluate  regional  wall motion and overall LV function. . The left ventricle has no regional  wall motion abnormalities. Left  ventricular diastolic parameters were normal.   2. Right ventricular systolic function reduced. The right ventricular  size is normal.   3. The mitral valve is normal in structure. Trivial mitral valve  regurgitation.   4. The aortic valve is tricuspid. Aortic valve regurgitation is not  visualized. Aortic valve sclerosis/calcification is present, without any  evidence of aortic stenosis.   5. The inferior vena cava is dilated in size with <50% respiratory  variability, suggesting right atrial pressure of 15 mmHg.   Patient Profile  Anthony Whipp. is a 73 y.o. male with CAD, COPD, PAD, HFpEF, persistent atrial fibrillation, DVT admitted on 07/26/2023 for acute on chronic COPD exacerbation.  Cardiology consulted for atrial fibrillation with RVR and acute systolic heart failure.  Assessment & Plan   # Persistent atrial fibrillation/flutter - Metoprolol  succinate 12.5 mg daily recommended.  He may be back in normal sinus rhythm.  EKG is pending.  Regardless rates have been controlled. Starting BB as recommended by cardiology yesterday.  - Could consider low-dose calcium  channel blocker.  - A-fib/flutter is being driven by lung disease.  Would continue aggressive treatment of this. - Continue Eliquis  5 mg twice  daily.  # Acute diastolic heart failure - EF reported to be normal versus low normal on most recent echo. - Euvolemic on exam.  Net -11 L since admission.  Transition to Lasix  20 mg daily.  # CAD # PAD - No acute issues.  Continue statin.  Not on aspirin  as he is on Eliquis . - continue home imdur . BB aas above   # Acute on chronic COPD exacerbation - Per primary team.  Poor airway movement noted on exam today.  [facility] will sign off.   Medication Recommendations:  As above Other recommendations (labs, testing, etc):  none Follow up as an  outpatient:  4-6 weeks with Dr. Verlin.   For questions or updates, please contact [facility] Please consult [email or web address] for contact info under       Signed, Darryle T. Barbaraann, MD, [facility]  [facility]  08/03/2023 9:10 AM

## 2023-08-03 NOTE — Progress Notes (Signed)
 PROGRESS NOTE  Anthony [name] Ruth Mickey.  FMW:[id number] DOB: 1950/07/12 DOA: 07/26/2023 PCP: No primary care provider on file.  Consultants   Brief Narrative: 73 y.o. male with a PMH significant for interstitial lung disease, COPD, COVID infection, coronary artery disease, diastolic heart failure, paroxysmal atrial fibrillation, DVT, peripheral artery disease s/p left above-knee amputation, diabetes mellitus type 2 and hyperlipidemia.  Patient is a skilled nursing facility resident.  Patient was admitted with altered mentation, acute on chronic respiratory failure, COPD with exacerbation, and multilobar pneumonia.  Patient's daughter, Cena Potters, who happens to be patient's power of attorney, has confirmed that patient is DNR.  Has had persistently elevated O2 requirements.  Unable to titrate down from about 45L/min HHFNC.     Assessment & Plan:  Acute on chronic hypoxic respiratory failure: COPD with exacerbation/ILD: - Pulmonology consulted 7/21 due rising oxygen requirements. -  Remains on HHNC 40L, O2 sats in room 88-90%.   - Strep/Legionella antigen negative.  Azithro stopped. - Plan to continue steroids. - Appreciate pulmonology input. - Slow attempts at weaning oxygen.  Will likely be 1-2 weeks per Pulm  A flutter/fib with AVR - New onset 7/25.  Cardiology consulted at that time. - He has now converted back to regular rhythm.  His blood pressure has been a little more tolerable in the 120 systolic and he is now on long-acting metoprolol . - Could add low-dose calcium  channel blocker if heart rate increases again. - Will continue Eliquis .  Acute on chronic HFpEF - Down 11 L since admission.  Now appears euvolemic - Echo did not show any change in his ejection fraction although it was a difficult acoustical windows study. -Currently on Lasix  40 mg daily, will continue.   Multilobar pneumonia: - Follow final cultures. - Now on cefepime  for broad gram-negative coverage -Repeat chest  x-ray continues to show improvement. - Nasal swab for MRSA came back negative.  Respiratory viral panel came back negative.  DM2:   - History of the same. - We are chasing his CBGs as he is continuing on steroids. - Appreciate diabetes coordinator input. - Increasing Semglee  again today   AKI: - Secondary to initial illness. - Continues to downtrend. -Phosphorus minimally elevated.  Will recheck.  Leukocytosis: - Remains elevated.  High-dose steroid use as per pulm recommendations contributing - Being treated for multilobar pneumonia as above.  Microcytic anemia: - Seems to be a new problem for him. - Last hemoglobin we have prior to this year was 2023 and his hemoglobin has typically been high at that time. - No acute evidence of blood loss.  Will check iron studies.   Acute encephalopathy, metabolic, cannot rule out toxic: - Resolved significantly.  Patient oriented on my exam today. - Likely multifactorial.   History of PAD: - Status post left AKA.    Goals of care:  -Patient tells me he does not have any family involved in his care -- in reality he does have a daughter who is medical POA - Consulted palliative care for further discussion regarding goals of care.  He is currently DNR/DNI.   DVT prophylaxis:   apixaban  (ELIQUIS ) tablet 5 mg  Code Status:   Code Status: Limited: Do not attempt resuscitation (DNR) -DNR-LIMITED -Do Not Intubate/DNI  Level of care: Progressive Status is: Inpatient Remain in stepdown   Consults called: Pulmonology, cardiology  Subjective: Patient awake and lying in bed.  Does have some coughing on my examination.  No complaints today.  Eating and drinking well.  Objective: Vitals:   08/03/23 1006 08/03/23 1013 08/03/23 1044 08/03/23 1132  BP:   120/66 123/73  Pulse:   67   Resp:      Temp:    98 F (36.7 C)  TempSrc:    Oral  SpO2: (!) 88% 94%  98%  Weight:      Height:        Intake/Output Summary (Last 24 hours) at  08/03/2023 1411 Last data filed at 08/03/2023 0802 Gross per 24 hour  Intake 360 ml  Output 2600 ml  Net -2240 ml   Filed Weights   08/01/23 0625 08/02/23 0311 08/03/23 0639  Weight: 83.4 kg 80 kg 78.1 kg   Body mass index is 25.43 kg/m.  Gen: 73 y.o. male in no apparent distress.  Chronically ill-appearing and wearing nasal cannula. Pulm: Non-labored breathing.  No accessory muscle use.  Rales right lower base.  Wearing high flow nasal cannula CV: Regular appearing rate with heart rate in the 60s GI: Abdomen soft, non-tender, non-distended Ext: Warm, no deformities, left AKA, no notable edema Skin: No rashes, lesions  Neuro: Alert and oriented today.  No focal neurological deficits. Psych: Calm     I have personally reviewed the following labs and images: CBC: Recent Labs  Lab 07/29/23 0234 08/03/23 0748  WBC 17.5* 22.8*  NEUTROABS 16.3*  --   HGB 9.6* 10.0*  HCT 34.7* 36.1*  MCV 70.2* 72.8*  PLT 366 371   BMP &GFR Recent Labs  Lab 07/29/23 0234 07/30/23 0228 07/31/23 0742 08/01/23 0805 08/03/23 0748  NA 137 138 134* 135 135  K 4.5 3.9 4.5 4.9 4.9  CL 101 98 98 101 100  CO2 22 27 27 27 26   GLUCOSE 249* 183* 280* 238* 368*  BUN 48* 47* 52* 37* 38*  CREATININE 1.21 1.30* 1.20 0.97 1.12  CALCIUM  9.3 9.5 8.9 8.7* 8.4*  MG  --  1.8  --   --  2.1  PHOS 3.6 4.9*  --   --   --    Estimated Creatinine Clearance: 57.9 mL/min (by C-G formula based on SCr of 1.12 mg/dL). Liver & Pancreas: Recent Labs  Lab 07/29/23 0234 07/30/23 0228  ALBUMIN  3.2* 3.2*   No results for input(s): LIPASE, AMYLASE in the last 168 hours. No results for input(s): AMMONIA in the last 168 hours. Diabetic: No results for input(s): HGBA1C in the last 72 hours. Recent Labs  Lab 08/02/23 1115 08/02/23 1605 08/02/23 2125 08/03/23 0635 08/03/23 1136  GLUCAP 273* 317* 203* 302* 255*   Cardiac Enzymes: No results for input(s): CKTOTAL, CKMB, CKMBINDEX, TROPONINI in the  last 168 hours. No results for input(s): PROBNP in the last 8760 hours. Coagulation Profile: No results for input(s): INR, PROTIME in the last 168 hours. Thyroid  Function Tests: No results for input(s): TSH, T4TOTAL, FREET4, T3FREE, THYROIDAB in the last 72 hours. Lipid Profile: No results for input(s): CHOL, HDL, LDLCALC, TRIG, CHOLHDL, LDLDIRECT in the last 72 hours. Anemia Panel: No results for input(s): VITAMINB12, FOLATE, FERRITIN, TIBC, IRON, RETICCTPCT in the last 72 hours. Urine analysis:    Component Value Date/Time   COLORURINE YELLOW 07/26/2023 0621   APPEARANCEUR HAZY (A) 07/26/2023 0621   LABSPEC 1.014 07/26/2023 0621   PHURINE 5.0 07/26/2023 0621   GLUCOSEU 50 (A) 07/26/2023 0621   HGBUR SMALL (A) 07/26/2023 0621   BILIRUBINUR NEGATIVE 07/26/2023 0621   KETONESUR NEGATIVE 07/26/2023 0621   PROTEINUR 30 (A) 07/26/2023 0621   UROBILINOGEN 0.2 05/05/2013 9366  NITRITE NEGATIVE 07/26/2023 0621   LEUKOCYTESUR NEGATIVE 07/26/2023 0621   Sepsis Labs: Invalid input(s): PROCALCITONIN, LACTICIDVEN  Microbiology: Recent Results (from the past 240 hours)  Resp panel by RT-PCR (RSV, Flu A&B, Covid) Anterior Nasal Swab     Status: None   Collection Time: 07/26/23  5:32 AM   Specimen: Anterior Nasal Swab  Result Value Ref Range Status   SARS Coronavirus 2 by RT PCR NEGATIVE NEGATIVE Final   Influenza A by PCR NEGATIVE NEGATIVE Final   Influenza B by PCR NEGATIVE NEGATIVE Final    Comment: (NOTE) The Xpert Xpress SARS-CoV-2/FLU/RSV plus assay is intended as an aid in the diagnosis of influenza from Nasopharyngeal swab specimens and should not be used as a sole basis for treatment. Nasal washings and aspirates are unacceptable for Xpert Xpress SARS-CoV-2/FLU/RSV testing.  Fact Sheet for Patients: BloggerCourse.com  Fact Sheet for Healthcare Providers: SeriousBroker.it  This  test is not yet approved or cleared by the [place]  FDA and has been authorized for detection and/or diagnosis of SARS-CoV-2 by FDA under an Emergency Use Authorization (EUA). This EUA will remain in effect (meaning this test can be used) for the duration of the COVID-19 declaration under Section 564(b)(1) of the Act, 21 U.S.C. section 360bbb-3(b)(1), unless the authorization is terminated or revoked.     Resp Syncytial Virus by PCR NEGATIVE NEGATIVE Final    Comment: (NOTE) Fact Sheet for Patients: BloggerCourse.com  Fact Sheet for Healthcare Providers: SeriousBroker.it  This test is not yet approved or cleared by the [place]  FDA and has been authorized for detection and/or diagnosis of SARS-CoV-2 by FDA under an Emergency Use Authorization (EUA). This EUA will remain in effect (meaning this test can be used) for the duration of the COVID-19 declaration under Section 564(b)(1) of the Act, 21 U.S.C. section 360bbb-3(b)(1), unless the authorization is terminated or revoked.  Performed at [facility], [address], [place], [place] [address]   Culture, blood (Routine X 2) w Reflex to ID Panel     Status: None   Collection Time: 07/26/23  5:24 PM   Specimen: BLOOD LEFT HAND  Result Value Ref Range Status   Specimen Description BLOOD LEFT HAND  Final   Special Requests   Final    BOTTLES DRAWN AEROBIC AND ANAEROBIC Blood Culture results may not be optimal due to an inadequate volume of blood received in culture bottles   Culture   Final    NO GROWTH 5 DAYS Performed at [facility], [address] [place], [place] [address]    Report Status 07/31/2023 FINAL  Final  Culture, blood (Routine X 2) w Reflex to ID Panel     Status: None   Collection Time: 07/26/23  5:26 PM   Specimen: BLOOD RIGHT HAND  Result Value Ref Range Status   Specimen Description BLOOD RIGHT HAND  Final   Special Requests   Final     BOTTLES DRAWN AEROBIC AND ANAEROBIC Blood Culture results may not be optimal due to an inadequate volume of blood received in culture bottles   Culture   Final    NO GROWTH 5 DAYS Performed at [facility], [address] [place], [place] [address]    Report Status 07/31/2023 FINAL  Final  Respiratory (~20 pathogens) panel by PCR     Status: None   Collection Time: 07/26/23  5:55 PM   Specimen: Nasopharyngeal Swab; Respiratory  Result Value Ref Range Status   Adenovirus  NOT DETECTED NOT DETECTED Final   Coronavirus 229E NOT DETECTED NOT DETECTED Final    Comment: (NOTE) The Coronavirus on the Respiratory Panel, DOES NOT test for the novel  Coronavirus (2019 nCoV)    Coronavirus HKU1 NOT DETECTED NOT DETECTED Final   Coronavirus NL63 NOT DETECTED NOT DETECTED Final   Coronavirus OC43 NOT DETECTED NOT DETECTED Final   Metapneumovirus NOT DETECTED NOT DETECTED Final   Rhinovirus / Enterovirus NOT DETECTED NOT DETECTED Final   Influenza A NOT DETECTED NOT DETECTED Final   Influenza B NOT DETECTED NOT DETECTED Final   Parainfluenza Virus 1 NOT DETECTED NOT DETECTED Final   Parainfluenza Virus 2 NOT DETECTED NOT DETECTED Final   Parainfluenza Virus 3 NOT DETECTED NOT DETECTED Final   Parainfluenza Virus 4 NOT DETECTED NOT DETECTED Final   Respiratory Syncytial Virus NOT DETECTED NOT DETECTED Final   Bordetella pertussis NOT DETECTED NOT DETECTED Final   Bordetella Parapertussis NOT DETECTED NOT DETECTED Final   Chlamydophila pneumoniae NOT DETECTED NOT DETECTED Final   Mycoplasma pneumoniae NOT DETECTED NOT DETECTED Final    Comment: Performed at [facility], [address], [place], [place] [address]  MRSA Next Gen by PCR, Nasal     Status: None   Collection Time: 07/27/23 12:40 AM   Specimen: Nasal Mucosa; Nasal Swab  Result Value Ref Range Status   MRSA by PCR Next Gen NOT DETECTED NOT DETECTED Final    Comment: (NOTE) The GeneXpert MRSA Assay (FDA approved for  NASAL specimens only), is one component of a comprehensive MRSA colonization surveillance program. It is not intended to diagnose MRSA infection nor to guide or monitor treatment for MRSA infections. Test performance is not FDA approved in patients less than 5 years old. Performed at [facility], [address], [place], [place] [address]     Radiology Studies: No results found.   Scheduled Meds:  apixaban   5 mg Oral BID   arformoterol   15 mcg Nebulization BID   atorvastatin   40 mg Oral Daily   budesonide  (PULMICORT ) nebulizer solution  0.5 mg Nebulization BID   escitalopram   10 mg Oral QHS   feeding supplement (GLUCERNA SHAKE)  237 mL Oral TID BM   ferrous sulfate   325 mg Oral Q breakfast   furosemide   20 mg Oral Daily   insulin  aspart  0-15 Units Subcutaneous TID WC   insulin  aspart  5 Units Subcutaneous TID WC   insulin  glargine-yfgn  35 Units Subcutaneous Daily   isosorbide  mononitrate  15 mg Oral Daily   methylPREDNISolone  (SOLU-MEDROL ) injection  80 mg Intravenous Q12H   metoprolol  succinate  12.5 mg Oral Daily   QUEtiapine   12.5 mg Oral QHS   revefenacin   175 mcg Nebulization Daily   sodium chloride  flush  10-40 mL Intracatheter Q12H   sodium chloride  flush  3 mL Intravenous Q4H while awake   traZODone   225 mg Oral QHS   Continuous Infusions:  ceFEPime  (MAXIPIME ) IV 2 g (08/03/23 0518)     LOS: 8 days   35 minutes with more than 50% spent in reviewing records, counseling patient/family and coordinating care.  Reyes VEAR Gaw, MD [facility] [email or web address] 08/03/2023, 2:11 PM

## 2023-08-03 NOTE — Progress Notes (Signed)
 Tried salter cannula at 12LPM but patient's saturations hovered at 85-86. Readjusted HHFNC cannula and placed back on at documented settings, saturations came back up. Will continue to monitor.

## 2023-08-04 DIAGNOSIS — J439 Emphysema, unspecified: Secondary | ICD-10-CM | POA: Diagnosis not present

## 2023-08-04 DIAGNOSIS — J8410 Pulmonary fibrosis, unspecified: Secondary | ICD-10-CM | POA: Diagnosis not present

## 2023-08-04 DIAGNOSIS — J9601 Acute respiratory failure with hypoxia: Secondary | ICD-10-CM | POA: Diagnosis not present

## 2023-08-04 DIAGNOSIS — J9621 Acute and chronic respiratory failure with hypoxia: Secondary | ICD-10-CM | POA: Diagnosis not present

## 2023-08-04 LAB — GLUCOSE, CAPILLARY
Glucose-Capillary: 256 mg/dL — ABNORMAL HIGH (ref 70–99)
Glucose-Capillary: 201 mg/dL — ABNORMAL HIGH (ref 70–99)
Glucose-Capillary: 237 mg/dL — ABNORMAL HIGH (ref 70–99)
Glucose-Capillary: 262 mg/dL — ABNORMAL HIGH (ref 70–99)
Glucose-Capillary: 284 mg/dL — ABNORMAL HIGH (ref 70–99)

## 2023-08-04 MED ORDER — INSULIN ASPART 100 UNIT/ML IJ SOLN
3.0000 [IU] | Freq: Once | INTRAMUSCULAR | Status: AC
  Administered 2023-08-04: 3 [IU] via SUBCUTANEOUS

## 2023-08-04 MED ORDER — INSULIN ASPART 100 UNIT/ML IJ SOLN
8.0000 [IU] | Freq: Three times a day (TID) | INTRAMUSCULAR | Status: DC
  Administered 2023-08-04 – 2023-08-11 (×22): 8 [IU] via SUBCUTANEOUS

## 2023-08-04 NOTE — Plan of Care (Signed)
   Problem: Education: Goal: Ability to describe self-care measures that may prevent or decrease complications (Diabetes Survival Skills Education) will improve Outcome: Progressing Goal: Individualized Educational Video(s) Outcome: Progressing   Problem: Coping: Goal: Ability to adjust to condition or change in health will improve Outcome: Progressing

## 2023-08-04 NOTE — Progress Notes (Signed)
 NAME:  Anthony Handel., MRN:  [id number], DOB:  1950-05-04, LOS: 9 ADMISSION DATE:  07/26/2023, CONSULTATION DATE:  07/29/23 REFERRING MD:  Rosario CHIEF COMPLAINT:  Hypoxia   History of Present Illness:  Anthony Barretto. is a 73 y.o. male who has a PMH as below including but not limited to COPD on 3L O2, ILD, past COVID, reported CHF (though echo from 7/18 with normal EF, no diastolic dysfunction. Echo from 2023 similar), PAF on Apixaban , PAD s/p L AKA. He resides at [facility].  He was admitted 07/26/23 after a fall and was found to have dyspnea and hypoxia. He had CT chest that demonstrated known emphysema along with bilateral lower lobe and RML consolidations. RVP was negative. He was started on CTX and Azithromycin .  Since admission, he had increasing O2 requirements. Azithromycin  had been stopped after 3 days. On 7/21, he was up to 100% FiO2 and 45L/min HHFNC. PCCM was subsequently asked to see in consultation for any additional recommendations. He has been diuresed and is -4L since admit. BNP from 7/20 was 245. He has been on Solumedrol 80mg  q12hrs since admit.  Pertinent  Medical History:  has CAD (coronary artery disease); Hyperlipidemia associated with type 2 diabetes mellitus (HCC); COPD (chronic obstructive pulmonary disease) (HCC); Tobacco abuse; Mood disorder (HCC); Bilateral inguinal hernia; Umbilical hernia; S/P bilateral inguinal hernia repair; Chronic right hip pain; Recurrent left inguinal hernia; S/P right THA, AA; Moderate episode of recurrent major depressive disorder (HCC); Rectal bleeding; Obesity (BMI 30-39.9); DM2 (diabetes mellitus, type 2) (HCC); Pain medication agreement signed; Aortic atherosclerosis (HCC); Insomnia; Multiple lung nodules; Chronic fatigue; Generalized weakness; Vitamin D  deficiency; Elevated vitamin B12 level; Acute neutrophilia; Spinal stenosis of lumbar region with neurogenic claudication; Chronic constipation; Chronic pain of right knee;  Cerebellar ataxia in diseases classified elsewhere [facility]); Fall against object; Right flank pain; Chronic respiratory failure with hypoxia (HCC); Hypokalemia; Bipolar 1 disorder (HCC); Pressure injury of skin; Rib fractures; History of DVT (deep vein thrombosis); HTN (hypertension); Paroxysmal atrial fibrillation with RVR (HCC); ILD (interstitial lung disease) (HCC); Acute exacerbation of CHF (congestive heart failure) (HCC); Acute respiratory failure with hypoxemia (HCC); Community acquired pneumonia; Typical atrial flutter (HCC); and Paroxysmal atrial fibrillation (HCC) on their problem list.  Significant Hospital Events: Including procedures, antibiotic start and stop dates in addition to other pertinent events   7/18 admit 7/21 up to 45L and 100% FiO2. Back on azithromycin  in case this is legionella not getting full treatment course 7/22 frequent desaturations, back on bipap to try to re-recruit since desaturations worse while sleeping. Antibiotics escalated to cefepime .   Interim History / Subjective:   Remains critically ill on HHFNC 100% / 40 L I was able to drop him on rounds to 85% saturation stayed at 97% 7/26 tried him on salter cannula at 12 L but saturations only 85 to 86%  Objective:  Blood pressure 106/61, pulse 64, temperature 98.7 F (37.1 C), temperature source Oral, resp. rate 13, height 5' 9 (1.753 m), weight 79.5 kg, SpO2 (!) 89%.    FiO2 (%):  [94 %-100 %] 94 %   Intake/Output Summary (Last 24 hours) at 08/04/2023 1019 Last data filed at 08/04/2023 0700 Gross per 24 hour  Intake --  Output 2800 ml  Net -2800 ml   Filed Weights   08/02/23 0311 08/03/23 0639 08/04/23 0354  Weight: 80 kg 78.1 kg 79.5 kg    Physical Exam: General: Chronically ill-appearing man lying in bed no acute distress  Neuro: Awake, interactive, poor memory , oriented to person and place, not to time HEENT: [place]/AT, eyes anicteric Cardiovascular: S1-S2, regular rate and rhythm Lungs: No  accessory muscle use, crackles right base Abdomen: Obese, soft, nondistended Musculoskeletal: Left AKA, no significant edema  Labs 7/26 show normal electrolytes, leukocytosis hyperglycemia  Labs/imaging personally reviewed:  CT chest 7/18 > known emphysema along with bilateral lower lobe and RML consolidations.  Assessment & Plan:   Acute on chronic hypoxic respiratory failure -  known CPFE (combined pulmonary fibrosis emphysema] and now superimposed bilateral PNA. Highly doubt PE as he is on Apixaban  and resides at SNF so unlikely to have missed doses.  Urine strep/Legionella antigen negative  -Continue cefepime  to complete 7 days, off azithromycin   -Continue IV Solu-Medrol  80 every 12 , although we have not seen remarkable improvement with this -BiPAP as needed as tolerated - Flutter valve and hypertonic saline nebs to help with pulmonary clearance , vest has been DC'd - Triple therapy bronchodilators-Brovana  and Yupelri , DuoNeb as needed -Continue efforts at weaning supplemental oxygen.  Attempt to transition back to salter at some point Unfortunately minimal progress here   DNR/DNI-- appropriate Palliative care assisting with goals of care Acute encephalopathy is improved but he has significant memory issues and intermittent confusion?  Undiagnosed dementia   We have him on maximum triple therapy nebs, antibiotics and steroids.  No escalation with DNR/DNI status.  Unfortunately with high oxygen requirements cannot move to a different care venue.  At the same time not clear whether this is terminal to advocate for hospice.  I hope is that worsening is related to pneumonia which may improve with time/1-2 more weeks ?  Best practice (evaluated daily):  Per primary team.  Labs   CBC: Recent Labs  Lab 07/29/23 0234 08/03/23 0748  WBC 17.5* 22.8*  NEUTROABS 16.3*  --   HGB 9.6* 10.0*  HCT 34.7* 36.1*  MCV 70.2* 72.8*  PLT 366 371    Basic Metabolic Panel: Recent Labs   Lab 07/29/23 0234 07/30/23 0228 07/31/23 0742 08/01/23 0805 08/03/23 0748  NA 137 138 134* 135 135  K 4.5 3.9 4.5 4.9 4.9  CL 101 98 98 101 100  CO2 22 27 27 27 26   GLUCOSE 249* 183* 280* 238* 368*  BUN 48* 47* 52* 37* 38*  CREATININE 1.21 1.30* 1.20 0.97 1.12  CALCIUM  9.3 9.5 8.9 8.7* 8.4*  MG  --  1.8  --   --  2.1  PHOS 3.6 4.9*  --   --   --    GFR: Estimated Creatinine Clearance: 57.9 mL/min (by C-G formula based on SCr of 1.12 mg/dL). Recent Labs  Lab 07/29/23 0234 08/03/23 0748  WBC 17.5* 22.8*    Liver Function Tests: Recent Labs  Lab 07/29/23 0234 07/30/23 0228  ALBUMIN  3.2* 3.2*     Amazing Cowman V. Jude  08/04/23 10:19 AM [facility]  For contact information, see Amion. If no response to pager, please call PCCM consult pager. After hours, 7PM- 7AM, please call Elink.

## 2023-08-04 NOTE — Progress Notes (Signed)
 eLink Physician-Brief Progress Note Patient Name: Anthony [name]. DOB: [birth year] MRN: [id number]   Date of Service  08/04/2023  HPI/Events of Note  Pt had been refusing chest PT per vest. Had not used it in several days.  Pt had been compliant with flutter valve.   eICU Interventions  D/c'd order for vest.         Chauntae Hults M DELA CRUZ 08/04/2023, 12:50 AM

## 2023-08-04 NOTE — Plan of Care (Signed)
  Problem: Coping: Goal: Ability to adjust to condition or change in health will improve Outcome: Progressing   Problem: Metabolic: Goal: Ability to maintain appropriate glucose levels will improve Outcome: Progressing   Problem: Nutritional: Goal: Maintenance of adequate nutrition will improve Outcome: Progressing   Problem: Clinical Measurements: Goal: Respiratory complications will improve Outcome: Progressing   Problem: Skin Integrity: Goal: Risk for impaired skin integrity will decrease Outcome: Progressing

## 2023-08-04 NOTE — Progress Notes (Signed)
 PROGRESS NOTE  Anthony [name].  FMW:[id number] DOB: 1950/08/30 DOA: 07/26/2023 PCP: No primary care provider on file.  Consultants   Brief Narrative: 73 y.o. male with a PMH significant for interstitial lung disease, COPD, COVID infection, coronary artery disease, diastolic heart failure, paroxysmal atrial fibrillation, DVT, peripheral artery disease s/p left above-knee amputation, diabetes mellitus type 2 and hyperlipidemia.  Patient is a skilled nursing facility resident.  Patient was admitted with altered mentation, acute on chronic respiratory failure, COPD with exacerbation, and multilobar pneumonia.  Patient's daughter, Anthony [name], who happens to be patient's power of attorney, has confirmed that patient is DNR.  Has had persistently elevated O2 requirements.  Difficulty being able to titrate down from about 45L/min HHFNC.     Assessment & Plan:  Acute on chronic hypoxic respiratory failure: COPD with exacerbation/ILD: - Pulmonology consulted 7/21 due rising oxygen requirements. -  Remains on HHNC 40L, O2 sats in room today greater than 90%. - Strep/Legionella antigen negative.  Azithro stopped. - Plan to continue steroids. - Appreciate pulmonology input. - Slow attempts at weaning oxygen.  Will likely be 1-2 weeks per Pulm  A flutter/fib with AVR - New onset 7/25.  Cardiology consulted at that time. - He has now converted back to regular rhythm.   - Watching blood pressure and continue on his long-acting metoprolol . - Could add low-dose calcium  channel blocker if heart rate increases again. - Will continue Eliquis .  Confusion, intermittent: - Worse in the AM. - Occasionally unable to remember his daughter's name or that he has a daughter.  Other times, often in the afternoon he can converse with me about how he has been doing, where he is, why he is in the hospital. - DNR/DNI.  Appreciate palliative care input. - No labs today.  He has started refusing some morning labs but  is able to get them drawn later in the day.  Acute on chronic HFpEF - Down 11 L since admission.  Now appears euvolemic - Echo did not show any change in his ejection fraction although it was a difficult acoustical windows study. -Currently on Lasix  40 mg daily, will continue.   Multilobar pneumonia: - Follow final cultures. - Now on cefepime  for broad gram-negative coverage -Repeat chest x-ray continues to show improvement. - Nasal swab for MRSA came back negative.  Respiratory viral panel came back negative.  DM2:   - History of the same. - CBG greater than 400 yesterday. - Appreciate diabetes coordinator input. - Increasing Semglee  again today and increasing mealtime insulin  as well  AKI: - Secondary to initial illness. - Continues to downtrend.  Leukocytosis: - Remains elevated.  High-dose steroid use as per pulm recommendations contributing - Being treated for multilobar pneumonia as above.  Microcytic anemia: - Seems to be a new problem for him. - Last hemoglobin we have prior to this year was 2023 and his hemoglobin has typically been high at that time. - No acute evidence of blood loss.  Will check iron studies.   Acute encephalopathy, metabolic, cannot rule out toxic: - Resolved significantly.  Patient oriented on my exam today. - Likely multifactorial.   History of PAD: - Status post left AKA.    Goals of care:  - daughter who is medical POA - Consulted palliative care for further discussion regarding goals of care.  He is currently DNR/DNI.   DVT prophylaxis:   apixaban  (ELIQUIS ) tablet 5 mg  Code Status:   Code Status: Limited: Do not attempt resuscitation (  DNR) -DNR-LIMITED -Do Not Intubate/DNI  Level of care: Progressive Status is: Inpatient Remain in stepdown   Consults called: Pulmonology, cardiology  Subjective: Patient lying in bed, somewhat confused this morning.  Still with some coughing.  No real complaints today.    Objective: Vitals:    08/04/23 0804 08/04/23 0844 08/04/23 1006 08/04/23 1136  BP: 106/61   (!) 117/55  Pulse: 64   77  Resp: 13   17  Temp: 98.7 F (37.1 C)   98.6 F (37 C)  TempSrc: Oral   Oral  SpO2: 95% 95% (!) 89% 95%  Weight:      Height:        Intake/Output Summary (Last 24 hours) at 08/04/2023 1428 Last data filed at 08/04/2023 1221 Gross per 24 hour  Intake 240 ml  Output 3200 ml  Net -2960 ml   Filed Weights   08/02/23 0311 08/03/23 0639 08/04/23 0354  Weight: 80 kg 78.1 kg 79.5 kg   Body mass index is 25.88 kg/m.  Gen: 73 y.o. male in no apparent distress.  Chronically ill-appearing and wearing high flow nasal cannula. Pulm: Non-labored breathing.  No accessory muscle use.  Rales right lower base.  Wearing high flow nasal cannula CV: Regular appearing rate with heart rate in the 60s GI: Abdomen soft, non-tender, non-distended Ext: Warm, no deformities, left AKA, no notable edema Skin: No rashes, lesions  Neuro: Alert and oriented today.  No focal neurological deficits. Psych: Calm     I have personally reviewed the following labs and images: CBC: Recent Labs  Lab 07/29/23 0234 08/03/23 0748  WBC 17.5* 22.8*  NEUTROABS 16.3*  --   HGB 9.6* 10.0*  HCT 34.7* 36.1*  MCV 70.2* 72.8*  PLT 366 371   BMP &GFR Recent Labs  Lab 07/29/23 0234 07/30/23 0228 07/31/23 0742 08/01/23 0805 08/03/23 0748  NA 137 138 134* 135 135  K 4.5 3.9 4.5 4.9 4.9  CL 101 98 98 101 100  CO2 22 27 27 27 26   GLUCOSE 249* 183* 280* 238* 368*  BUN 48* 47* 52* 37* 38*  CREATININE 1.21 1.30* 1.20 0.97 1.12  CALCIUM  9.3 9.5 8.9 8.7* 8.4*  MG  --  1.8  --   --  2.1  PHOS 3.6 4.9*  --   --   --    Estimated Creatinine Clearance: 57.9 mL/min (by C-G formula based on SCr of 1.12 mg/dL). Liver & Pancreas: Recent Labs  Lab 07/29/23 0234 07/30/23 0228  ALBUMIN  3.2* 3.2*   No results for input(s): LIPASE, AMYLASE in the last 168 hours. No results for input(s): AMMONIA in the last 168  hours. Diabetic: No results for input(s): HGBA1C in the last 72 hours. Recent Labs  Lab 08/03/23 1602 08/03/23 2105 08/04/23 0241 08/04/23 0641 08/04/23 1133  GLUCAP 271* 415* 256* 201* 237*   Cardiac Enzymes: No results for input(s): CKTOTAL, CKMB, CKMBINDEX, TROPONINI in the last 168 hours. No results for input(s): PROBNP in the last 8760 hours. Coagulation Profile: No results for input(s): INR, PROTIME in the last 168 hours. Thyroid  Function Tests: No results for input(s): TSH, T4TOTAL, FREET4, T3FREE, THYROIDAB in the last 72 hours. Lipid Profile: No results for input(s): CHOL, HDL, LDLCALC, TRIG, CHOLHDL, LDLDIRECT in the last 72 hours. Anemia Panel: No results for input(s): VITAMINB12, FOLATE, FERRITIN, TIBC, IRON, RETICCTPCT in the last 72 hours. Urine analysis:    Component Value Date/Time   COLORURINE YELLOW 07/26/2023 0621   APPEARANCEUR HAZY (A) 07/26/2023 9378  LABSPEC 1.014 07/26/2023 0621   PHURINE 5.0 07/26/2023 0621   GLUCOSEU 50 (A) 07/26/2023 0621   HGBUR SMALL (A) 07/26/2023 0621   BILIRUBINUR NEGATIVE 07/26/2023 0621   KETONESUR NEGATIVE 07/26/2023 0621   PROTEINUR 30 (A) 07/26/2023 0621   UROBILINOGEN 0.2 05/05/2013 0633   NITRITE NEGATIVE 07/26/2023 0621   LEUKOCYTESUR NEGATIVE 07/26/2023 0621   Sepsis Labs: Invalid input(s): PROCALCITONIN, LACTICIDVEN  Microbiology: Recent Results (from the past 240 hours)  Resp panel by RT-PCR (RSV, Flu A&B, Covid) Anterior Nasal Swab     Status: None   Collection Time: 07/26/23  5:32 AM   Specimen: Anterior Nasal Swab  Result Value Ref Range Status   SARS Coronavirus 2 by RT PCR NEGATIVE NEGATIVE Final   Influenza A by PCR NEGATIVE NEGATIVE Final   Influenza B by PCR NEGATIVE NEGATIVE Final    Comment: (NOTE) The Xpert Xpress SARS-CoV-2/FLU/RSV plus assay is intended as an aid in the diagnosis of influenza from Nasopharyngeal swab specimens and should  not be used as a sole basis for treatment. Nasal washings and aspirates are unacceptable for Xpert Xpress SARS-CoV-2/FLU/RSV testing.  Fact Sheet for Patients: BloggerCourse.com  Fact Sheet for Healthcare Providers: SeriousBroker.it  This test is not yet approved or cleared by the [place]  FDA and has been authorized for detection and/or diagnosis of SARS-CoV-2 by FDA under an Emergency Use Authorization (EUA). This EUA will remain in effect (meaning this test can be used) for the duration of the COVID-19 declaration under Section 564(b)(1) of the Act, 21 U.S.C. section 360bbb-3(b)(1), unless the authorization is terminated or revoked.     Resp Syncytial Virus by PCR NEGATIVE NEGATIVE Final    Comment: (NOTE) Fact Sheet for Patients: BloggerCourse.com  Fact Sheet for Healthcare Providers: SeriousBroker.it  This test is not yet approved or cleared by the [place]  FDA and has been authorized for detection and/or diagnosis of SARS-CoV-2 by FDA under an Emergency Use Authorization (EUA). This EUA will remain in effect (meaning this test can be used) for the duration of the COVID-19 declaration under Section 564(b)(1) of the Act, 21 U.S.C. section 360bbb-3(b)(1), unless the authorization is terminated or revoked.  Performed at [facility], [address], [place], [place] [address]   Culture, blood (Routine X 2) w Reflex to ID Panel     Status: None   Collection Time: 07/26/23  5:24 PM   Specimen: BLOOD LEFT HAND  Result Value Ref Range Status   Specimen Description BLOOD LEFT HAND  Final   Special Requests   Final    BOTTLES DRAWN AEROBIC AND ANAEROBIC Blood Culture results may not be optimal due to an inadequate volume of blood received in culture bottles   Culture   Final    NO GROWTH 5 DAYS Performed at [facility], [address] [place],  [place] [address]    Report Status 07/31/2023 FINAL  Final  Culture, blood (Routine X 2) w Reflex to ID Panel     Status: None   Collection Time: 07/26/23  5:26 PM   Specimen: BLOOD RIGHT HAND  Result Value Ref Range Status   Specimen Description BLOOD RIGHT HAND  Final   Special Requests   Final    BOTTLES DRAWN AEROBIC AND ANAEROBIC Blood Culture results may not be optimal due to an inadequate volume of blood received in culture bottles   Culture   Final    NO GROWTH 5 DAYS Performed at [facility], [address] [place], [place] [address]    Report Status 07/31/2023 FINAL  Final  Respiratory (~20 pathogens) panel by PCR     Status: None   Collection Time: 07/26/23  5:55 PM   Specimen: Nasopharyngeal Swab; Respiratory  Result Value Ref Range Status   Adenovirus NOT DETECTED NOT DETECTED Final   Coronavirus 229E NOT DETECTED NOT DETECTED Final    Comment: (NOTE) The Coronavirus on the Respiratory Panel, DOES NOT test for the novel  Coronavirus (2019 nCoV)    Coronavirus HKU1 NOT DETECTED NOT DETECTED Final   Coronavirus NL63 NOT DETECTED NOT DETECTED Final   Coronavirus OC43 NOT DETECTED NOT DETECTED Final   Metapneumovirus NOT DETECTED NOT DETECTED Final   Rhinovirus / Enterovirus NOT DETECTED NOT DETECTED Final   Influenza A NOT DETECTED NOT DETECTED Final   Influenza B NOT DETECTED NOT DETECTED Final   Parainfluenza Virus 1 NOT DETECTED NOT DETECTED Final   Parainfluenza Virus 2 NOT DETECTED NOT DETECTED Final   Parainfluenza Virus 3 NOT DETECTED NOT DETECTED Final   Parainfluenza Virus 4 NOT DETECTED NOT DETECTED Final   Respiratory Syncytial Virus NOT DETECTED NOT DETECTED Final   Bordetella pertussis NOT DETECTED NOT DETECTED Final   Bordetella Parapertussis NOT DETECTED NOT DETECTED Final   Chlamydophila pneumoniae NOT DETECTED NOT DETECTED Final   Mycoplasma pneumoniae NOT DETECTED NOT DETECTED Final    Comment: Performed at [facility], [address]  [place], [place] [address]  MRSA Next Gen by PCR, Nasal     Status: None   Collection Time: 07/27/23 12:40 AM   Specimen: Nasal Mucosa; Nasal Swab  Result Value Ref Range Status   MRSA by PCR Next Gen NOT DETECTED NOT DETECTED Final    Comment: (NOTE) The GeneXpert MRSA Assay (FDA approved for NASAL specimens only), is one component of a comprehensive MRSA colonization surveillance program. It is not intended to diagnose MRSA infection nor to guide or monitor treatment for MRSA infections. Test performance is not FDA approved in patients less than 54 years old. Performed at [facility], [address] [place], [place] [address]     Radiology Studies: No results found.   Scheduled Meds:  apixaban   5 mg Oral BID   arformoterol   15 mcg Nebulization BID   atorvastatin   40 mg Oral Daily   budesonide  (PULMICORT ) nebulizer solution  0.5 mg Nebulization BID   escitalopram   10 mg Oral QHS   feeding supplement (GLUCERNA SHAKE)  237 mL Oral TID BM   ferrous sulfate   325 mg Oral Q breakfast   furosemide   20 mg Oral Daily   insulin  aspart  0-15 Units Subcutaneous TID WC   insulin  aspart  5 Units Subcutaneous TID WC   insulin  glargine-yfgn  35 Units Subcutaneous Daily   isosorbide  mononitrate  15 mg Oral Daily   methylPREDNISolone  (SOLU-MEDROL ) injection  80 mg Intravenous Q12H   metoprolol  succinate  12.5 mg Oral Daily   QUEtiapine   12.5 mg Oral QHS   revefenacin   175 mcg Nebulization Daily   sodium chloride  flush  10-40 mL Intracatheter Q12H   sodium chloride  flush  3 mL Intravenous Q4H while awake   traZODone   225 mg Oral QHS   Continuous Infusions:  ceFEPime  (MAXIPIME ) IV 2 g (08/04/23 0301)     LOS: 9 days   35 minutes with more than 50% spent in reviewing records, counseling patient/family and coordinating care.  Reyes VEAR Gaw, MD [facility] [email or web address] 08/04/2023,  2:28 PM

## 2023-08-05 DIAGNOSIS — J439 Emphysema, unspecified: Secondary | ICD-10-CM | POA: Diagnosis not present

## 2023-08-05 DIAGNOSIS — J8410 Pulmonary fibrosis, unspecified: Secondary | ICD-10-CM | POA: Diagnosis not present

## 2023-08-05 DIAGNOSIS — J9621 Acute and chronic respiratory failure with hypoxia: Secondary | ICD-10-CM | POA: Diagnosis not present

## 2023-08-05 DIAGNOSIS — J9601 Acute respiratory failure with hypoxia: Secondary | ICD-10-CM | POA: Diagnosis not present

## 2023-08-05 LAB — GLUCOSE, CAPILLARY
Glucose-Capillary: 331 mg/dL — ABNORMAL HIGH (ref 70–99)
Glucose-Capillary: 244 mg/dL — ABNORMAL HIGH (ref 70–99)
Glucose-Capillary: 217 mg/dL — ABNORMAL HIGH (ref 70–99)

## 2023-08-05 LAB — CBC
HCT: 30.4 % — ABNORMAL LOW (ref 39.0–52.0)
Hemoglobin: 8.5 g/dL — ABNORMAL LOW (ref 13.0–17.0)
MCH: 20.3 pg — ABNORMAL LOW (ref 26.0–34.0)
MCHC: 28 g/dL — ABNORMAL LOW (ref 30.0–36.0)
MCV: 72.7 fL — ABNORMAL LOW (ref 80.0–100.0)
Platelets: 368 K/uL (ref 150–400)
RBC: 4.18 MIL/uL — ABNORMAL LOW (ref 4.22–5.81)
RDW: 20.2 % — ABNORMAL HIGH (ref 11.5–15.5)
WBC: 26.7 K/uL — ABNORMAL HIGH (ref 4.0–10.5)
nRBC: 0 % (ref 0.0–0.2)

## 2023-08-05 LAB — BASIC METABOLIC PANEL WITH GFR
Anion gap: 9 (ref 5–15)
BUN: 41 mg/dL — ABNORMAL HIGH (ref 8–23)
CO2: 26 mmol/L (ref 22–32)
Calcium: 8.6 mg/dL — ABNORMAL LOW (ref 8.9–10.3)
Chloride: 97 mmol/L — ABNORMAL LOW (ref 98–111)
Creatinine, Ser: 1.14 mg/dL (ref 0.61–1.24)
GFR, Estimated: >60 mL/min (ref >60)
Glucose, Bld: 413 mg/dL — ABNORMAL HIGH (ref 70–99)
Potassium: 4.9 mmol/L (ref 3.5–5.1)
Sodium: 132 mmol/L — ABNORMAL LOW (ref 135–145)

## 2023-08-05 MED ORDER — METHYLPREDNISOLONE SODIUM SUCC 40 MG IJ SOLR
40.0000 mg | INTRAMUSCULAR | Status: DC
  Administered 2023-08-06: 40 mg via INTRAVENOUS
  Filled 2023-08-05: qty 1

## 2023-08-05 MED ORDER — INSULIN GLARGINE-YFGN 100 UNIT/ML ~~LOC~~ SOLN
40.0000 [IU] | Freq: Every day | SUBCUTANEOUS | Status: DC
  Administered 2023-08-06 – 2023-08-08 (×3): 40 [IU] via SUBCUTANEOUS
  Filled 2023-08-05 (×4): qty 0.4

## 2023-08-05 NOTE — TOC Progression Note (Signed)
 Transition of Care (TOC) - Progression Note    Patient Details  Name: Brand Siever. MRN: [id number] Date of Birth: 08/27/1950  Transition of Care [facility]) CM/SW Contact  Lauraine FORBES Saa, LCSW Phone Number: 08/05/2023, 2:41 PM  Clinical Narrative:     2:41 PM Per progressions, patient is not yet medically ready to return to [facility]. Patient remains on HHFNC. SNF admissions informed CSW that they are not able to accommodate oxygen levels greater than 4L nasal cannula. CSW will continue to follow and be available to assist.  Expected Discharge Plan: Long Term Nursing Home Barriers to Discharge: Continued Medical Work up               Expected Discharge Plan and Services In-house Referral: Clinical Social Work   Post Acute Care Choice: Skilled Nursing Facility Living arrangements for the past 2 months: Skilled Nursing Facility                                       Social Drivers of Health (SDOH) Interventions SDOH Screenings   Food Insecurity: Patient Unable To Answer (07/26/2023)  Housing: Patient Unable To Answer (07/26/2023)  Transportation Needs: Patient Unable To Answer (07/26/2023)  Utilities: Patient Unable To Answer (07/26/2023)  Depression (PHQ2-9): Medium Risk (11/27/2019)  Financial Resource Strain: Low Risk  (09/15/2020)   Received from [facility]  Social Connections: Patient Unable To Answer (07/26/2023)  Stress: No Stress Concern Present (09/15/2020)   Received from [facility]  Tobacco Use: Medium Risk (07/26/2023)    Readmission Risk Interventions    02/13/2021    3:47 PM  Readmission Risk Prevention Plan  Transportation Screening Complete  PCP or Specialist Appt within 5-7 Days Complete  Home Care Screening Complete  Medication Review (RN CM) Complete

## 2023-08-05 NOTE — Inpatient Diabetes Management (Signed)
 Inpatient Diabetes Program Recommendations  AACE/ADA: New Consensus Statement on Inpatient Glycemic Control   Target Ranges:  Prepandial:   less than 140 mg/dL      Peak postprandial:   less than 180 mg/dL (1-2 hours)      Critically ill patients:  140 - 180 mg/dL    Latest Reference Range & Units 08/04/23 06:41 08/04/23 11:33 08/04/23 15:55 08/04/23 21:05 08/05/23 06:16 08/05/23 11:15  Glucose-Capillary 70 - 99 mg/dL 798 (H) 762 (H) 737 (H) 284 (H) 331 (H) 244 (H)   Review of Glycemic Control  Diabetes history: DM2 Outpatient Diabetes medications: Glipizide  10 mg daily, Metformin  1000 mg BID, Rybelsus  7 mg daily Current orders for Inpatient glycemic control: Semglee  35 units daily, Novolog  0-15 units TID with meals, Novolog  8 units TID with meals; Solumedrol 80 mg Q12H   Inpatient Diabetes Program Recommendations:     Insulin :  If steroids are continued as ordered, please increasing Semglee  to 39 units daily and meal coverage to Novolog  10 units TID with meals if patient eats at least 50% of meals.   Thanks, Earnie Gainer, RN, MSN, CDCES Diabetes Coordinator Inpatient Diabetes Program [phone] (Team Pager from 8am to 5pm)

## 2023-08-05 NOTE — Progress Notes (Addendum)
 PROGRESS NOTE  Anthony [name].  FMW:[id number] DOB: 1950/10/09 DOA: 07/26/2023 PCP: No primary care provider on file.  Consultants   Brief Narrative: 73 y.o. male with a PMH significant for interstitial lung disease, COPD, COVID infection, coronary artery disease, diastolic heart failure, paroxysmal atrial fibrillation, DVT, peripheral artery disease s/p left above-knee amputation, diabetes mellitus type 2 and hyperlipidemia.  Patient is a skilled nursing facility resident.  Patient was admitted with altered mentation, acute on chronic respiratory failure, COPD with exacerbation, and multilobar pneumonia.  Patient's daughter, Anthony [name], who happens to be patient's power of attorney, has confirmed that patient is DNR.  Has had persistently elevated O2 requirements.  Difficulty being able to titrate wean off O2.   Assessment & Plan:  Acute on chronic hypoxic respiratory failure: COPD with exacerbation/ILD: - Pulmonology consulted 7/21 due rising oxygen requirements. -  Remains on HHNC 40L, O2 sats in room today greater than 90%. -  Appreciate pulmonology input.  Steroids decreased today.   - Slow attempts at weaning oxygen.  Will likely be 1-2 weeks per Pulm  A flutter/fib with AVR - New onset 7/25.  Cardiology consulted at that time. - He has now converted back to regular rhythm. BP has been low, pulse regular rhythm now.  Holding metoprolol  in light of BP 90s systolic this AM.   - Watching blood pressure and continue on his long-acting metoprolol . - Could add low-dose calcium  channel blocker if heart rate increases again. - Will continue Eliquis .  Confusion, intermittent: - Worse in the AM generally.   - He was much more conversant/awake today. - DNR/DNI.  Appreciate palliative care input. - No labs today.  He has started refusing some morning labs but is able to get them drawn later in the day.  Blistering rash: - on buttocks  - new yesterday PM.  See photo in media section   - Nontender.  Not a clear dermatomal distribution.  Doesn't appear to be shingles - Barrier cream as needed.  Already appears to be fading, less vesicular than photo from yesterday.  Psuedohyponatremia: - secondary to elevated CBGs - treat DM2   Acute on chronic HFpEF - Down 12 L since admission.  Now appears euvolemic - Echo did not show any change in his ejection fraction although it was a difficult acoustical windows study. -Currently on oral Lasix  20 mg daily, will continue.  Watch for worsening swelling   Multilobar pneumonia: - Follow final cultures. - Now on cefepime  for broad gram-negative coverage -Repeat chest x-ray continues to show improvement. - Nasal swab for MRSA came back negative.  Respiratory viral panel came back negative.  DM2:   - History of the same. - CBG greater than 400 yesterday. - Appreciate diabetes coordinator input. - Increasing Semglee  again today.  Likely CBGs better now that we've decreased steroids  AKI: - Secondary to initial illness. - Continues to downtrend.  Leukocytosis: - Remains elevated.  High-dose steroid use as per pulm recommendations contributing - Being treated for multilobar pneumonia as above.  Microcytic anemia: - Seems to be a new problem for him. - Last hemoglobin we have prior to this year was 2023 and his hemoglobin has typically been high at that time. - No acute evidence of blood loss.  Will check iron studies.   Acute encephalopathy, metabolic, cannot rule out toxic: - Resolved significantly.  Patient oriented on my exam today. - Likely multifactorial.   History of PAD: - Status post left AKA.    Goals  of care:  - daughter who is medical POA - Consulted palliative care for further discussion regarding goals of care.  He is currently DNR/DNI.   DVT prophylaxis:   apixaban  (ELIQUIS ) tablet 5 mg  Code Status:   Code Status: Limited: Do not attempt resuscitation (DNR) -DNR-LIMITED -Do Not Intubate/DNI  Level of  care: Progressive Status is: Inpatient Remain in stepdown   Consults called: Pulmonology, cardiology  Subjective: Awake and alert.  Seated in chair beside bed.  Reports breathing subjectively good.  Much more coherent today.   Objective: Vitals:   08/05/23 0500 08/05/23 0752 08/05/23 0808 08/05/23 1119  BP:  93/64  (!) 104/59  Pulse:  64  (!) 54  Resp:  19 20 18   Temp:  98.3 F (36.8 C)  97.9 F (36.6 C)  TempSrc:  Oral  Oral  SpO2:  97%  90%  Weight: 78.4 kg     Height:        Intake/Output Summary (Last 24 hours) at 08/05/2023 1434 Last data filed at 08/05/2023 0907 Gross per 24 hour  Intake 460 ml  Output 3925 ml  Net -3465 ml   Filed Weights   08/03/23 0639 08/04/23 0354 08/05/23 0500  Weight: 78.1 kg 79.5 kg 78.4 kg   Body mass index is 25.52 kg/m.  Gen: 73 y.o. male in no apparent distress.  Chronically ill-appearing and wearing high flow nasal cannula. Pulm: Non-labored breathing.  No accessory muscle use.  Rales right lower base.  Wearing high flow nasal cannula CV: Regular appearing rate with heart rate in the 60s GI: Abdomen soft, non-tender, non-distended Ext: Warm, no deformities, left AKA, no notable edema Skin: No rashes, lesions  Neuro: Alert and oriented to person and place today.  No focal neurological deficits. Psych: Calm     I have personally reviewed the following labs and images: CBC: Recent Labs  Lab 08/03/23 0748 08/05/23 0223  WBC 22.8* 26.7*  HGB 10.0* 8.5*  HCT 36.1* 30.4*  MCV 72.8* 72.7*  PLT 371 368   BMP &GFR Recent Labs  Lab 07/30/23 0228 07/31/23 0742 08/01/23 0805 08/03/23 0748 08/05/23 0223  NA 138 134* 135 135 132*  K 3.9 4.5 4.9 4.9 4.9  CL 98 98 101 100 97*  CO2 27 27 27 26 26   GLUCOSE 183* 280* 238* 368* 413*  BUN 47* 52* 37* 38* 41*  CREATININE 1.30* 1.20 0.97 1.12 1.14  CALCIUM  9.5 8.9 8.7* 8.4* 8.6*  MG 1.8  --   --  2.1  --   PHOS 4.9*  --   --   --   --    Estimated Creatinine Clearance: 56.8  mL/min (by C-G formula based on SCr of 1.14 mg/dL). Liver & Pancreas: Recent Labs  Lab 07/30/23 0228  ALBUMIN  3.2*   No results for input(s): LIPASE, AMYLASE in the last 168 hours. No results for input(s): AMMONIA in the last 168 hours. Diabetic: No results for input(s): HGBA1C in the last 72 hours. Recent Labs  Lab 08/04/23 1133 08/04/23 1555 08/04/23 2105 08/05/23 0616 08/05/23 1115  GLUCAP 237* 262* 284* 331* 244*   Cardiac Enzymes: No results for input(s): CKTOTAL, CKMB, CKMBINDEX, TROPONINI in the last 168 hours. No results for input(s): PROBNP in the last 8760 hours. Coagulation Profile: No results for input(s): INR, PROTIME in the last 168 hours. Thyroid  Function Tests: No results for input(s): TSH, T4TOTAL, FREET4, T3FREE, THYROIDAB in the last 72 hours. Lipid Profile: No results for input(s): CHOL, HDL, LDLCALC,  TRIG, CHOLHDL, LDLDIRECT in the last 72 hours. Anemia Panel: No results for input(s): VITAMINB12, FOLATE, FERRITIN, TIBC, IRON, RETICCTPCT in the last 72 hours. Urine analysis:    Component Value Date/Time   COLORURINE YELLOW 07/26/2023 0621   APPEARANCEUR HAZY (A) 07/26/2023 0621   LABSPEC 1.014 07/26/2023 0621   PHURINE 5.0 07/26/2023 0621   GLUCOSEU 50 (A) 07/26/2023 0621   HGBUR SMALL (A) 07/26/2023 0621   BILIRUBINUR NEGATIVE 07/26/2023 0621   KETONESUR NEGATIVE 07/26/2023 0621   PROTEINUR 30 (A) 07/26/2023 0621   UROBILINOGEN 0.2 05/05/2013 0633   NITRITE NEGATIVE 07/26/2023 0621   LEUKOCYTESUR NEGATIVE 07/26/2023 0621   Sepsis Labs: Invalid input(s): PROCALCITONIN, LACTICIDVEN  Microbiology: Recent Results (from the past 240 hours)  Culture, blood (Routine X 2) w Reflex to ID Panel     Status: None   Collection Time: 07/26/23  5:24 PM   Specimen: BLOOD LEFT HAND  Result Value Ref Range Status   Specimen Description BLOOD LEFT HAND  Final   Special Requests   Final    BOTTLES  DRAWN AEROBIC AND ANAEROBIC Blood Culture results may not be optimal due to an inadequate volume of blood received in culture bottles   Culture   Final    NO GROWTH 5 DAYS Performed at [facility], [address], [place], [place] [address]    Report Status 07/31/2023 FINAL  Final  Culture, blood (Routine X 2) w Reflex to ID Panel     Status: None   Collection Time: 07/26/23  5:26 PM   Specimen: BLOOD RIGHT HAND  Result Value Ref Range Status   Specimen Description BLOOD RIGHT HAND  Final   Special Requests   Final    BOTTLES DRAWN AEROBIC AND ANAEROBIC Blood Culture results may not be optimal due to an inadequate volume of blood received in culture bottles   Culture   Final    NO GROWTH 5 DAYS Performed at [facility], [address], [place], [place] [address]    Report Status 07/31/2023 FINAL  Final  Respiratory (~20 pathogens) panel by PCR     Status: None   Collection Time: 07/26/23  5:55 PM   Specimen: Nasopharyngeal Swab; Respiratory  Result Value Ref Range Status   Adenovirus NOT DETECTED NOT DETECTED Final   Coronavirus 229E NOT DETECTED NOT DETECTED Final    Comment: (NOTE) The Coronavirus on the Respiratory Panel, DOES NOT test for the novel  Coronavirus (2019 nCoV)    Coronavirus HKU1 NOT DETECTED NOT DETECTED Final   Coronavirus NL63 NOT DETECTED NOT DETECTED Final   Coronavirus OC43 NOT DETECTED NOT DETECTED Final   Metapneumovirus NOT DETECTED NOT DETECTED Final   Rhinovirus / Enterovirus NOT DETECTED NOT DETECTED Final   Influenza A NOT DETECTED NOT DETECTED Final   Influenza B NOT DETECTED NOT DETECTED Final   Parainfluenza Virus 1 NOT DETECTED NOT DETECTED Final   Parainfluenza Virus 2 NOT DETECTED NOT DETECTED Final   Parainfluenza Virus 3 NOT DETECTED NOT DETECTED Final   Parainfluenza Virus 4 NOT DETECTED NOT DETECTED Final   Respiratory Syncytial Virus NOT DETECTED NOT DETECTED Final   Bordetella pertussis NOT DETECTED NOT DETECTED Final    Bordetella Parapertussis NOT DETECTED NOT DETECTED Final   Chlamydophila pneumoniae NOT DETECTED NOT DETECTED Final   Mycoplasma pneumoniae NOT DETECTED NOT DETECTED Final    Comment: Performed at [facility], [address] [place], [place] [address]  MRSA Next Gen by PCR,  Nasal     Status: None   Collection Time: 07/27/23 12:40 AM   Specimen: Nasal Mucosa; Nasal Swab  Result Value Ref Range Status   MRSA by PCR Next Gen NOT DETECTED NOT DETECTED Final    Comment: (NOTE) The GeneXpert MRSA Assay (FDA approved for NASAL specimens only), is one component of a comprehensive MRSA colonization surveillance program. It is not intended to diagnose MRSA infection nor to guide or monitor treatment for MRSA infections. Test performance is not FDA approved in patients less than 37 years old. Performed at [facility], [address], [place], [place] [address]     Radiology Studies: No results found.   Scheduled Meds:  apixaban   5 mg Oral BID   arformoterol   15 mcg Nebulization BID   atorvastatin   40 mg Oral Daily   budesonide  (PULMICORT ) nebulizer solution  0.5 mg Nebulization BID   escitalopram   10 mg Oral QHS   feeding supplement (GLUCERNA SHAKE)  237 mL Oral TID BM   ferrous sulfate   325 mg Oral Q breakfast   furosemide   20 mg Oral Daily   insulin  aspart  0-15 Units Subcutaneous TID WC   insulin  aspart  8 Units Subcutaneous TID WC   insulin  glargine-yfgn  35 Units Subcutaneous Daily   isosorbide  mononitrate  15 mg Oral Daily   [START ON 08/06/2023] methylPREDNISolone  (SOLU-MEDROL ) injection  40 mg Intravenous Q24H   metoprolol  succinate  12.5 mg Oral Daily   QUEtiapine   12.5 mg Oral QHS   revefenacin   175 mcg Nebulization Daily   sodium chloride  flush  10-40 mL Intracatheter Q12H   sodium chloride  flush  3 mL Intravenous Q4H while awake   traZODone   225 mg Oral QHS   Continuous Infusions:  ceFEPime  (MAXIPIME ) IV 2 g (08/05/23 0226)     LOS: 10 days   35  minutes with more than 50% spent in reviewing records, counseling patient/family and coordinating care.  Reyes VEAR Gaw, MD [facility] [email or web address] 08/05/2023, 2:34 PM

## 2023-08-05 NOTE — Progress Notes (Signed)
 NAME:  Anthony Goytia., MRN:  [id number], DOB:  03-14-1950, LOS: 10 ADMISSION DATE:  07/26/2023, CONSULTATION DATE:  07/29/23 REFERRING MD:  Rosario CHIEF COMPLAINT:  Hypoxia   History of Present Illness:  Anthony Leverich. is a 73 y.o. male who has a PMH as below including but not limited to COPD on 3L O2, ILD, past COVID, reported CHF (though echo from 7/18 with normal EF, no diastolic dysfunction. Echo from 2023 similar), PAF on Apixaban , PAD s/p L AKA. He resides at [facility].  He was admitted 07/26/23 after a fall and was found to have dyspnea and hypoxia. He had CT chest that demonstrated known emphysema along with bilateral lower lobe and RML consolidations. RVP was negative. He was started on CTX and Azithromycin .  Since admission, he had increasing O2 requirements. Azithromycin  had been stopped after 3 days. On 7/21, he was up to 100% FiO2 and 45L/min HHFNC. PCCM was subsequently asked to see in consultation for any additional recommendations. He has been diuresed and is -4L since admit. BNP from 7/20 was 245. He has been on Solumedrol 80mg  q12hrs since admit.  Pertinent  Medical History:  has CAD (coronary artery disease); Hyperlipidemia associated with type 2 diabetes mellitus (HCC); COPD (chronic obstructive pulmonary disease) (HCC); Tobacco abuse; Mood disorder (HCC); Bilateral inguinal hernia; Umbilical hernia; S/P bilateral inguinal hernia repair; Chronic right hip pain; Recurrent left inguinal hernia; S/P right THA, AA; Moderate episode of recurrent major depressive disorder (HCC); Rectal bleeding; Obesity (BMI 30-39.9); DM2 (diabetes mellitus, type 2) (HCC); Pain medication agreement signed; Aortic atherosclerosis (HCC); Insomnia; Multiple lung nodules; Chronic fatigue; Generalized weakness; Vitamin D  deficiency; Elevated vitamin B12 level; Acute neutrophilia; Spinal stenosis of lumbar region with neurogenic claudication; Chronic constipation; Chronic pain of right knee;  Cerebellar ataxia in diseases classified elsewhere [facility] Fall against object; Right flank pain; Chronic respiratory failure with hypoxia (HCC); Hypokalemia; Bipolar 1 disorder (HCC); Pressure injury of skin; Rib fractures; History of DVT (deep vein thrombosis); HTN (hypertension); Paroxysmal atrial fibrillation with RVR (HCC); ILD (interstitial lung disease) (HCC); Acute exacerbation of CHF (congestive heart failure) (HCC); Acute respiratory failure with hypoxemia (HCC); Community acquired pneumonia; Typical atrial flutter (HCC); and Paroxysmal atrial fibrillation (HCC) on their problem list.  Significant Hospital Events: Including procedures, antibiotic start and stop dates in addition to other pertinent events   7/18 admit 7/21 up to 45L and 100% FiO2. Back on azithromycin  in case this is legionella not getting full treatment course 7/22 frequent desaturations, back on bipap to try to re-recruit since desaturations worse while sleeping. Antibiotics escalated to cefepime .   Interim History / Subjective:   Down to 75% / 35 L HFNC Denies chest pain  Objective:  Blood pressure (!) 104/59, pulse (!) 54, temperature 97.9 F (36.6 C), temperature source Oral, resp. rate 18, height 5' 9 (1.753 m), weight 78.4 kg, SpO2 90%.    FiO2 (%):  [75 %-94 %] 75 %   Intake/Output Summary (Last 24 hours) at 08/05/2023 1211 Last data filed at 08/05/2023 9092 Gross per 24 hour  Intake 700 ml  Output 3925 ml  Net -3225 ml   Filed Weights   08/03/23 0639 08/04/23 0354 08/05/23 0500  Weight: 78.1 kg 79.5 kg 78.4 kg    Physical Exam: General: Chronically ill-appearing man lying in bed no acute distress Neuro: Awake, interactive, poor memory , confused baseline HEENT: /AT, eyes anicteric Cardiovascular: S1-S2, regular rate and rhythm Lungs: Crackles right base, no accessory muscle use  Abdomen: Obese, soft, nondistended Musculoskeletal: Left AKA, no significant edema  Labs 7/27 show hyponatremia,  increased leukocytosis hyperglycemia  Labs/imaging personally reviewed:  CT chest 7/18 > known emphysema along with bilateral lower lobe and RML consolidations.  Assessment & Plan:   Acute on chronic hypoxic respiratory failure -  known CPFE (combined pulmonary fibrosis emphysema] and now superimposed bilateral PNA. Highly doubt PE as he is on Apixaban  and resides at SNF so unlikely to have missed doses.  Urine strep/Legionella antigen negative  -Continue cefepime  to complete 7 days, off azithromycin   - Decrease Solu-Medrol  40 every 24 -do not think steroids are helping , simply causing hyperglycemia and leukocytosis -BiPAP as needed as tolerated - Flutter valve and hypertonic saline nebs to help with pulmonary clearance , vest has been DC'd - Triple therapy bronchodilators-Brovana  and Yupelri , DuoNeb as needed -Continue efforts at weaning supplemental oxygen.  Attempt to transition back to salter  daily Unfortunately minimal progress here   DNR/DNI-- appropriate Palliative care assisting with goals of care Acute encephalopathy is improved but he has significant memory issues and intermittent confusion?  Undiagnosed dementia   We have him on maximum triple therapy nebs, antibiotics .  He has not responded to steroids, doubt IPF flare.  No escalation with DNR/DNI status.  Unfortunately with high oxygen requirements cannot move to a different care venue. Hope here is that worsening hypoxia is related to pneumonia which may improve with time/1-2 more weeks ?  Best practice (evaluated daily):  Per primary team.  Labs   CBC: Recent Labs  Lab 08/03/23 0748 08/05/23 0223  WBC 22.8* 26.7*  HGB 10.0* 8.5*  HCT 36.1* 30.4*  MCV 72.8* 72.7*  PLT 371 368    Basic Metabolic Panel: Recent Labs  Lab 07/30/23 0228 07/31/23 0742 08/01/23 0805 08/03/23 0748 08/05/23 0223  NA 138 134* 135 135 132*  K 3.9 4.5 4.9 4.9 4.9  CL 98 98 101 100 97*  CO2 27 27 27 26 26   GLUCOSE 183* 280*  238* 368* 413*  BUN 47* 52* 37* 38* 41*  CREATININE 1.30* 1.20 0.97 1.12 1.14  CALCIUM  9.5 8.9 8.7* 8.4* 8.6*  MG 1.8  --   --  2.1  --   PHOS 4.9*  --   --   --   --    GFR: Estimated Creatinine Clearance: 56.8 mL/min (by C-G formula based on SCr of 1.14 mg/dL). Recent Labs  Lab 08/03/23 0748 08/05/23 0223  WBC 22.8* 26.7*    Liver Function Tests: Recent Labs  Lab 07/30/23 0228  ALBUMIN  3.2*     Tali Cleaves V. Jude  08/05/23 12:11 PM [facility]  For contact information, see Amion. If no response to pager, please call PCCM consult pager. After hours, 7PM- 7AM, please call Elink.

## 2023-08-05 NOTE — Progress Notes (Signed)
 Physical Therapy Treatment Patient Details Name: Anthony [name]. MRN: [id number] DOB: January 01, 1951 Today's Date: 08/05/2023   History of Present Illness 73 y.o. male presents to [facility] 07/26/23 from [place] nursing home after sliding off the bed with AMS and hypoxia. Admitted with acute hypoxic respiratory failure, PNA, and acute encephalopathy. PMHx: Cognitive deficit, ILD, COPD, CAD with hx inferior STEMI, RCA PCI, diastolic HF, paroxysmal A-fib, DVT, on anticoagulation, PAD with L AKA. diabetes type 2, hyperlipidemia.    PT Comments  Remains confused but pleasant. Aware he is at [place] today. Agreeable to work with PT. Focused on sit to stand and step pivot transfers. Performed at [facility] level today. On HHFNC at 30L/min and 70% O2. SpO2 maintained 88-90% throughout duration of session with mild-moderate dyspnea at times. HR 60s. BP 116/68. Reviewed LE exercises. Tolerated all tasks well. Patient will benefit from continued inpatient follow up therapy, <3 hours/day Patient will continue to benefit from skilled physical therapy services to further improve independence with functional mobility.     If plan is discharge home, recommend the following: Assistance with cooking/housework;Assist for transportation;Help with stairs or ramp for entrance;Supervision due to cognitive status;A lot of help with walking and/or transfers;A lot of help with bathing/dressing/bathroom   Can travel by private vehicle     Yes  Equipment Recommendations  None recommended by PT    Recommendations for Other Services       Precautions / Restrictions Precautions Precautions: Fall Recall of Precautions/Restrictions: Impaired Precaution/Restrictions Comments: watch O2; impulsive and tends to remove Ruby. HHFNC Restrictions Weight Bearing Restrictions Per Provider Order: No     Mobility  Bed Mobility Overal bed mobility: Needs Assistance Bed Mobility: Supine to Sit     Supine to sit: Contact guard      General bed mobility comments: CGA for safety with mild instability but able to self correct. Mild dizziness upon sitting but resolved gradually.    Transfers Overall transfer level: Needs assistance Equipment used: Rolling walker (2 wheels) Transfers: Sit to/from Stand, Bed to chair/wheelchair/BSC Sit to Stand: Contact guard assist   Step pivot transfers: Contact guard assist       General transfer comment: CGA for safety with transition to stand from bed and again from recliner. CGA for hop pivot to recliner. Cues for hand placement and set up.    Ambulation/Gait                   Stairs             Wheelchair Mobility     Tilt Bed    Modified Rankin (Stroke Patients Only)       Balance Overall balance assessment: Needs assistance, History of Falls Sitting-balance support: Feet supported, No upper extremity supported Sitting balance-Leahy Scale: Fair     Standing balance support: Bilateral upper extremity supported, Reliant on assistive device for balance Standing balance-Leahy Scale: Poor                              Communication Communication Communication: No apparent difficulties Factors Affecting Communication: Hearing impaired  Cognition Arousal: Alert Behavior During Therapy: WFL for tasks assessed/performed   PT - Cognitive impairments: History of cognitive impairments, Problem solving, Safety/Judgement, Attention, Orientation, Memory, Awareness   Orientation impairments: Time, Situation                   PT - Cognition Comments: [facility], cannot recall year. Following commands: Intact Following commands impaired: Follows one step commands with increased time, Only follows one step commands consistently    Cueing Cueing Techniques: Verbal cues, Gestural cues  Exercises General Exercises - Lower Extremity Ankle Circles/Pumps: AROM, Right, 10 reps, Seated Quad Sets: Strengthening, Right, 10  reps, Seated Gluteal Sets: Strengthening, Both, 10 reps, Seated Long Arc Quad: Strengthening, Right, 10 reps, Seated Hip Flexion/Marching: Strengthening, Left, 10 reps, Seated    General Comments General comments (skin integrity, edema, etc.): HHFNC 30 L/min a 70%O2, SpO2 88% at rest, and 90% with activity. BP 116/68, HR 62.      Pertinent Vitals/Pain Pain Assessment Pain Assessment: No/denies pain    Home Living                          Prior Function            PT Goals (current goals can now be found in the care plan section) Acute Rehab PT Goals Patient Stated Goal: to feel better PT Goal Formulation: With patient Time For Goal Achievement: 08/09/23 Potential to Achieve Goals: Good Progress towards PT goals: Progressing toward goals    Frequency    Min 2X/week      PT Plan      Co-evaluation              AM-PAC PT 6 Clicks Mobility   Outcome Measure  Help needed turning from your back to your side while in a flat bed without using bedrails?: A Little Help needed moving from lying on your back to sitting on the side of a flat bed without using bedrails?: A Little Help needed moving to and from a bed to a chair (including a wheelchair)?: A Little Help needed standing up from a chair using your arms (e.g., wheelchair or bedside chair)?: A Little Help needed to walk in hospital room?: Total Help needed climbing 3-5 steps with a railing? : Total 6 Click Score: 14    End of Session Equipment Utilized During Treatment: Oxygen Activity Tolerance: Patient tolerated treatment well Patient left: with call bell/phone within reach;in chair;with chair alarm set;with restraints reapplied ((Mitts on)) Nurse Communication: Mobility status PT Visit Diagnosis: Other abnormalities of gait and mobility (R26.89);Muscle weakness (generalized) (M62.81);History of falling (Z91.81);Unsteadiness on feet (R26.81)     Time: 8664-8642 PT Time Calculation (min)  (ACUTE ONLY): 22 min  Charges:    $Therapeutic Activity: 8-22 mins PT General Charges $$ ACUTE PT VISIT: 1 Visit                     Leontine Roads, PT, DPT [facility]  Rehabilitation Services Physical Therapist Office: [phone] Website: [email or web address]    Leontine GORMAN Roads 08/05/2023, 3:08 PM

## 2023-08-06 DIAGNOSIS — J189 Pneumonia, unspecified organism: Secondary | ICD-10-CM | POA: Diagnosis not present

## 2023-08-06 DIAGNOSIS — J439 Emphysema, unspecified: Secondary | ICD-10-CM | POA: Diagnosis not present

## 2023-08-06 DIAGNOSIS — I509 Heart failure, unspecified: Secondary | ICD-10-CM | POA: Diagnosis not present

## 2023-08-06 DIAGNOSIS — J9621 Acute and chronic respiratory failure with hypoxia: Secondary | ICD-10-CM | POA: Diagnosis not present

## 2023-08-06 DIAGNOSIS — Z515 Encounter for palliative care: Secondary | ICD-10-CM | POA: Diagnosis not present

## 2023-08-06 DIAGNOSIS — J8410 Pulmonary fibrosis, unspecified: Secondary | ICD-10-CM | POA: Diagnosis not present

## 2023-08-06 DIAGNOSIS — J9601 Acute respiratory failure with hypoxia: Secondary | ICD-10-CM | POA: Diagnosis not present

## 2023-08-06 LAB — BASIC METABOLIC PANEL WITH GFR
Anion gap: 5 (ref 5–15)
BUN: 32 mg/dL — ABNORMAL HIGH (ref 8–23)
CO2: 32 mmol/L (ref 22–32)
Calcium: 8.6 mg/dL — ABNORMAL LOW (ref 8.9–10.3)
Chloride: 98 mmol/L (ref 98–111)
Creatinine, Ser: 0.88 mg/dL (ref 0.61–1.24)
GFR, Estimated: >60 mL/min (ref >60)
Glucose, Bld: 176 mg/dL — ABNORMAL HIGH (ref 70–99)
Potassium: 4.2 mmol/L (ref 3.5–5.1)
Sodium: 135 mmol/L (ref 135–145)

## 2023-08-06 LAB — CBC
HCT: 33.8 % — ABNORMAL LOW (ref 39.0–52.0)
Hemoglobin: 9.3 g/dL — ABNORMAL LOW (ref 13.0–17.0)
MCH: 20.2 pg — ABNORMAL LOW (ref 26.0–34.0)
MCHC: 27.5 g/dL — ABNORMAL LOW (ref 30.0–36.0)
MCV: 73.5 fL — ABNORMAL LOW (ref 80.0–100.0)
Platelets: 386 K/uL (ref 150–400)
RBC: 4.6 MIL/uL (ref 4.22–5.81)
RDW: 21 % — ABNORMAL HIGH (ref 11.5–15.5)
WBC: 26.2 K/uL — ABNORMAL HIGH (ref 4.0–10.5)
nRBC: 0 % (ref 0.0–0.2)

## 2023-08-06 LAB — GLUCOSE, CAPILLARY
Glucose-Capillary: 155 mg/dL — ABNORMAL HIGH (ref 70–99)
Glucose-Capillary: 125 mg/dL — ABNORMAL HIGH (ref 70–99)
Glucose-Capillary: 203 mg/dL — ABNORMAL HIGH (ref 70–99)
Glucose-Capillary: 370 mg/dL — ABNORMAL HIGH (ref 70–99)

## 2023-08-06 MED ORDER — PREDNISONE 20 MG PO TABS
40.0000 mg | ORAL_TABLET | Freq: Every day | ORAL | Status: DC
  Administered 2023-08-07 – 2023-08-08 (×2): 40 mg via ORAL
  Filled 2023-08-06 (×2): qty 2

## 2023-08-06 NOTE — Progress Notes (Signed)
 Palliative Medicine Progress Note   Patient Name: Anthony [name].       Date: 08/06/2023 DOB: 1950/04/19  Age: 73 y.o. MRN#: [id number] Attending Physician: Elpidio Reyes DEL, MD Primary Care Physician: No primary care provider on file. Admit Date: 07/26/2023   HPI/Patient Profile: 73 y.o. male  with past medical history of ILD, COPD, chronic respiratory failure on 3 L O2, prior COVID infection, coronary artery disease, reported CHF (though prior echo with normal EF and no diastolic dysfunction), paroxysmal atrial fibrillation, DVT, peripheral artery disease s/p left AKA, diabetes type 2, and hyperlipidemia.  He presented to the ED on 07/26/2023 after a fall and was found to have dyspnea and hypoxia.  CT chest showed known emphysema along with bilateral lower lobe and RML consolidations.  He is admitted with acute on chronic hypoxic respiratory failure; multifactorial due to COPD exacerbation and ILD along with superimposed bilateral pneumonia.  Since admission, he has had increasing O2 requirements.   Palliative Medicine has been consulted for goals of care discussions. Patient and family are faced with anticipatory care needs and complex medical decision making.   Subjective: Chart reviewed. Update received from RN. This morning, patient taken off HHFNC and placed on Salter at 12 L .    Patient assessed at bedside. He is currently sleeping deeply, does not awake to voice. He is tolerating salter at 12 L.   I later spoke with daughter/[name] by phone. Provided updates on patient's condition as per above. Reviewed current plan of care to continue supportive interventions and return to SNF when medically stable/optimized.  Objective:  Physical Exam Vitals reviewed.  Constitutional:       General: He is sleeping.     Comments: Chronically ill-appearing  Cardiovascular:     Rate and Rhythm: Normal rate.  Pulmonary:     Effort: No respiratory distress.  Musculoskeletal:     Left Lower Extremity: Left leg is amputated above knee.             Vital Signs: BP 104/64 (BP Location: Left Arm)   Pulse 64   Temp 98.5 F (36.9 C) (Oral)   Resp (!) 23   Ht 5' 9 (1.753 m)   Wt 80.5 kg   SpO2 (!) 89%   BMI 26.21 kg/m  SpO2: SpO2: (!) 89 % O2  Device: O2 Device: (S) High Flow Nasal Cannula (SALTER) O2 Flow Rate: O2 Flow Rate (L/min): (S) 12 L/min  LBM: Last BM Date : 08/04/23       Palliative Medicine Assessment & Plan   Assessment: Principal Problem:   Acute exacerbation of CHF (congestive heart failure) (HCC) Active Problems:   Acute respiratory failure with hypoxemia (HCC)   Community acquired pneumonia   Typical atrial flutter (HCC)   Paroxysmal atrial fibrillation (HCC)    Recommendations/Plan: Continue current scope of care Goal of care is medical optimization and return to SNF when medically stable Daughter agrees to hospice support at [facility] Ongoing palliative support   Primary Decision Maker: NEXT OF KIN - Daughter/[name]   Existing Vynca/ACP Documentation: None   Code Status/Advance Care Planning: DNR - Limited   Symptom Management:  Morphine  1-2 mg IV every 4 hours as needed for pain or shortness of breath   Prognosis:  < 6 months would not be surprising  Discharge Planning: {Palliative dispostion:23505}  Care plan was discussed with ***  Thank you for allowing the Palliative Medicine Team to assist in the care of this patient.   ***   Anthony KATHEE Loll, NP   Please contact Palliative Medicine Team phone at [phone] for questions and concerns.  For individual providers, please see AMION.

## 2023-08-06 NOTE — Progress Notes (Signed)
 NAME:  Anthony [name]., MRN:  [id number], DOB:  1950-05-08, LOS: 11 ADMISSION DATE:  07/26/2023, CONSULTATION DATE:  07/29/23 REFERRING MD:  Rosario CHIEF COMPLAINT:  Hypoxia   History of Present Illness:  Anthony [name]. is a 73 y.o. male who has a PMH as below including but not limited to COPD on 3L O2, ILD, past COVID, reported CHF (though echo from 7/18 with normal EF, no diastolic dysfunction. Echo from 2023 similar), PAF on Apixaban , PAD s/p L AKA. He resides at [facility].  He was admitted 07/26/23 after a fall and was found to have dyspnea and hypoxia. He had CT chest that demonstrated known emphysema along with bilateral lower lobe and RML consolidations. RVP was negative. He was started on CTX and Azithromycin .  Since admission, he had increasing O2 requirements. Azithromycin  had been stopped after 3 days. On 7/21, he was up to 100% FiO2 and 45L/min HHFNC. PCCM was subsequently asked to see in consultation for any additional recommendations. He has been diuresed and is -4L since admit. BNP from 7/20 was 245. He has been on Solumedrol 80mg  q12hrs since admit.  Pertinent  Medical History:  has CAD (coronary artery disease); Hyperlipidemia associated with type 2 diabetes mellitus (HCC); COPD (chronic obstructive pulmonary disease) (HCC); Tobacco abuse; Mood disorder (HCC); Bilateral inguinal hernia; Umbilical hernia; S/P bilateral inguinal hernia repair; Chronic right hip pain; Recurrent left inguinal hernia; S/P right THA, AA; Moderate episode of recurrent major depressive disorder (HCC); Rectal bleeding; Obesity (BMI 30-39.9); DM2 (diabetes mellitus, type 2) (HCC); Pain medication agreement signed; Aortic atherosclerosis (HCC); Insomnia; Multiple lung nodules; Chronic fatigue; Generalized weakness; Vitamin D  deficiency; Elevated vitamin B12 level; Acute neutrophilia; Spinal stenosis of lumbar region with neurogenic claudication; Chronic constipation; Chronic pain of right knee;  Cerebellar ataxia in diseases classified elsewhere [facility]); Fall against object; Right flank pain; Chronic respiratory failure with hypoxia (HCC); Hypokalemia; Bipolar 1 disorder (HCC); Pressure injury of skin; Rib fractures; History of DVT (deep vein thrombosis); HTN (hypertension); Paroxysmal atrial fibrillation with RVR (HCC); ILD (interstitial lung disease) (HCC); Acute exacerbation of CHF (congestive heart failure) (HCC); Acute respiratory failure with hypoxemia (HCC); Community acquired pneumonia; Typical atrial flutter (HCC); and Paroxysmal atrial fibrillation (HCC) on their problem list.  Significant Hospital Events: Including procedures, antibiotic start and stop dates in addition to other pertinent events   7/18 admit 7/21 up to 45L and 100% FiO2. Back on azithromycin  in case this is legionella not getting full treatment course 7/22 frequent desaturations, back on bipap to try to re-recruit since desaturations worse while sleeping. Antibiotics escalated to cefepime .   Interim History / Subjective:   Down to 65% / 30 L HHFNC Denies chest pain Afebrile  Objective:  Blood pressure 104/64, pulse 64, temperature 98.5 F (36.9 C), temperature source Oral, resp. rate (!) 23, height 5' 9 (1.753 m), weight 80.5 kg, SpO2 (!) 89%.    FiO2 (%):  [65 %] 65 %   Intake/Output Summary (Last 24 hours) at 08/06/2023 1256 Last data filed at 08/05/2023 2000 Gross per 24 hour  Intake 300 ml  Output 1750 ml  Net -1450 ml   Filed Weights   08/04/23 0354 08/05/23 0500 08/06/23 0402  Weight: 79.5 kg 78.4 kg 80.5 kg    Physical Exam: General: Chronically ill-appearing man lying in bed no acute distress Neuro: Awake, interactive, poor memory , confused baseline HEENT: [place]/AT, eyes anicteric Cardiovascular: S1-S2, regular rate and rhythm Lungs: Crackles right base, no accessory muscle use  Abdomen: Obese, soft, nondistended Musculoskeletal: Left AKA, no significant edema  Labs 7/27 show improved  sodium, stable leukocytosis, improving hyperglycemia  Labs/imaging personally reviewed:  CT chest 7/18 > known emphysema along with bilateral lower lobe and RML consolidations.  Assessment & Plan:   Acute on chronic hypoxic respiratory failure -  known CPFE (combined pulmonary fibrosis emphysema] and now superimposed bilateral PNA. Highly doubt PE as he is on Apixaban  and resides at SNF so unlikely to have missed doses.  Urine strep/Legionella antigen negative  - Completed cefepime  x 7 days - Decrease Solu-Medrol  20 every 24 -do not think steroids are helping , simply causing hyperglycemia and leukocytosis - Flutter valve and hypertonic saline nebs to help with pulmonary clearance , vest has been DC'd - Triple therapy bronchodilators-Brovana  and Yupelri , DuoNeb as needed -Continue efforts at weaning supplemental oxygen.  Was able to transition to 4 L salter nasal cannula, saturation 88% and above acceptable   DNR/DNI-- appropriate Palliative care assisting with goals of care Acute encephalopathy is improved but he has significant memory issues and intermittent confusion?  Undiagnosed dementia   We have him on maximum triple therapy nebs, antibiotics .  He has not responded to steroids, doubt IPF flare.  No escalation with DNR/DNI status.   Hope here is that worsening hypoxia is related to pneumonia which may improve with time, able to transition to salter nasal cannula today  Best practice (evaluated daily):  Per primary team.  Labs   CBC: Recent Labs  Lab 08/03/23 0748 08/05/23 0223 08/06/23 0910  WBC 22.8* 26.7* 26.2*  HGB 10.0* 8.5* 9.3*  HCT 36.1* 30.4* 33.8*  MCV 72.8* 72.7* 73.5*  PLT 371 368 386    Basic Metabolic Panel: Recent Labs  Lab 07/31/23 0742 08/01/23 0805 08/03/23 0748 08/05/23 0223 08/06/23 0910  NA 134* 135 135 132* 135  K 4.5 4.9 4.9 4.9 4.2  CL 98 101 100 97* 98  CO2 27 27 26 26  32  GLUCOSE 280* 238* 368* 413* 176*  BUN 52* 37* 38* 41* 32*   CREATININE 1.20 0.97 1.12 1.14 0.88  CALCIUM  8.9 8.7* 8.4* 8.6* 8.6*  MG  --   --  2.1  --   --    GFR: Estimated Creatinine Clearance: 73.6 mL/min (by C-G formula based on SCr of 0.88 mg/dL). Recent Labs  Lab 08/03/23 0748 08/05/23 0223 08/06/23 0910  WBC 22.8* 26.7* 26.2*    Liver Function Tests: No results for input(s): AST, ALT, ALKPHOS, BILITOT, PROT, ALBUMIN  in the last 168 hours.    Harden ROCKFORD Jude  08/06/23 12:56 PM  Pulmonary & Critical Care  For contact information, see Amion. If no response to pager, please call PCCM consult pager. After hours, 7PM- 7AM, please call Elink.

## 2023-08-06 NOTE — TOC Progression Note (Signed)
 Transition of Care (TOC) - Progression Note    Patient Details  Name: Anthony [name]. MRN: [id number] Date of Birth: 1950/04/30  Transition of Care [facility]) CM/SW Contact  Lauraine FORBES Saa, LCSW Phone Number: 08/06/2023, 2:34 PM  Clinical Narrative:     2:34 PM Per chart review, patient is on 12L HFNC . Per progressions, PCCM expects patient to discharge to SNF LTC in approximately seven to fourteen days. CSW will continue to follow and be available to assist.  Expected Discharge Plan: Long Term Nursing Home Barriers to Discharge: Continued Medical Work up               Expected Discharge Plan and Services In-house Referral: Clinical Social Work   Post Acute Care Choice: Skilled Nursing Facility Living arrangements for the past 2 months: Skilled Nursing Facility                                       Social Drivers of Health (SDOH) Interventions SDOH Screenings   Food Insecurity: Patient Unable To Answer (07/26/2023)  Housing: Patient Unable To Answer (07/26/2023)  Transportation Needs: Patient Unable To Answer (07/26/2023)  Utilities: Patient Unable To Answer (07/26/2023)  Depression (PHQ2-9): Medium Risk (11/27/2019)  Financial Resource Strain: Low Risk  (09/15/2020)   Received from [facility]  Social Connections: Patient Unable To Answer (07/26/2023)  Stress: No Stress Concern Present (09/15/2020)   Received from [facility]  Tobacco Use: Medium Risk (07/26/2023)    Readmission Risk Interventions    02/13/2021    3:47 PM  Readmission Risk Prevention Plan  Transportation Screening Complete  PCP or Specialist Appt within 5-7 Days Complete  Home Care Screening Complete  Medication Review (RN CM) Complete

## 2023-08-06 NOTE — Progress Notes (Signed)
   08/06/23 1144  Therapy Vitals  Pulse Rate 64  Resp (!) 23  MEWS Score/Color  MEWS Score 1  MEWS Score Color Green  Oxygen Therapy/Pulse Ox  O2 Device (S)  HFNC (SALTER)  O2 Therapy Oxygen humidified  O2 Flow Rate (L/min) (S)  12 L/min  SpO2 (!) 89 %      Pt taken off HHFNC and placed on a HFNC (Salter) 12L per MD request with a sat goal of 88 and above. VSS and pt is tolerating well. HHFNC at bedside on standby if needed. RT will monitor.

## 2023-08-06 NOTE — Progress Notes (Signed)
 Occupational Therapy Treatment Patient Details Name: Anthony [name]. MRN: [id number] DOB: 04/09/1950 Today's Date: 08/06/2023   History of present illness 73 y.o. male presents to [facility] 07/26/23 from [facility] home after sliding off the bed with AMS and hypoxia. Admitted with acute hypoxic respiratory failure, PNA, and acute encephalopathy. PMHx: Cognitive deficit, ILD, COPD, CAD with hx inferior STEMI, RCA PCI, diastolic HF, paroxysmal A-fib, DVT, on anticoagulation, PAD with L AKA. diabetes type 2, hyperlipidemia.   OT comments  Pt in bed upon arrival agreeable to EOB and OOB activity with OT. Pt sat EOB CGA using rails. Pt participated in grooming/hygiene.  tasks with Sup, UB dressing CGA, LB dressing to don sock mod A, STS min A/CGA to RW to SPT to [facility] CGA. Pt continues to be confused but pleasant; aware that he is in the hospital today.  Pt on HFNC 12L and 89% O2 SATs at rest, dropping to 78% with ADL and ADL mobility activity. Pt instructed on deep, pursed lip breathing to recover to 88-89% in 1 minute, HR in 70s. OT will continue to follow acutely to maximize level of function and safety      If plan is discharge home, recommend the following:  A lot of help with bathing/dressing/bathroom;A lot of help with walking and/or transfers;Assistance with cooking/housework;Direct supervision/assist for financial management;Supervision due to cognitive status;Assist for transportation;Help with stairs or ramp for entrance;Direct supervision/assist for medications management   Equipment Recommendations  Other (comment) (defer)    Recommendations for Other Services      Precautions / Restrictions Precautions Precautions: Fall Recall of Precautions/Restrictions: Impaired Precaution/Restrictions Comments: watch O2; impulsive and tends to remove [place]. HHFNC Restrictions Weight Bearing Restrictions Per Provider Order: No       Mobility Bed Mobility Overal bed mobility: Needs Assistance Bed  Mobility: Supine to Sit, Sit to Supine     Supine to sit: Contact guard, Used rails, HOB elevated Sit to supine: Contact guard assist   General bed mobility comments: no c/o dizziness    Transfers Overall transfer level: Needs assistance Equipment used: Rolling walker (2 wheels) Transfers: Sit to/from Stand, Bed to chair/wheelchair/BSC Sit to Stand: Contact guard assist     Step pivot transfers: Contact guard assist     General transfer comment: CGA for safety,c ues for hand placement, STS CGA and SPT to BSC and back to bed     Balance Overall balance assessment: Needs assistance, History of Falls Sitting-balance support: Feet supported, No upper extremity supported Sitting balance-Leahy Scale: Fair     Standing balance support: Bilateral upper extremity supported, Reliant on assistive device for balance, During functional activity                               ADL either performed or assessed with clinical judgement   ADL Overall ADL's : Needs assistance/impaired     Grooming: Wash/dry hands;Wash/dry face;Brushing hair;Supervision/safety;Sitting           Upper Body Dressing : Contact guard assist;Sitting   Lower Body Dressing: Moderate assistance   Toilet Transfer: Contact guard assist;Cueing for safety;Rolling walker (2 wheels);Stand-pivot   Toileting- Architect and Hygiene: Moderate assistance;Sitting/lateral lean       Functional mobility during ADLs: Contact guard assist;Rolling walker (2 wheels);Cueing for safety General ADL Comments: CGA SPT to [facility]    Extremity/Trunk Assessment Upper Extremity Assessment Upper Extremity Assessment: Generalized weakness   Lower Extremity Assessment Lower Extremity Assessment:  Defer to PT evaluation LLE Deficits / Details: hx of L AKA   Cervical / Trunk Assessment Cervical / Trunk Assessment: Normal    Vision Ability to See in Adequate Light: 0 Adequate Patient Visual Report: No change  from baseline     Perception     Praxis     Communication Communication Communication: No apparent difficulties Factors Affecting Communication: Hearing impaired   Cognition Arousal: Alert Behavior During Therapy: WFL for tasks assessed/performed Cognition: History of cognitive impairments                               Following commands: Intact Following commands impaired: Follows one step commands with increased time, Only follows one step commands consistently      Cueing   Cueing Techniques: Verbal cues, Gestural cues  Exercises      Shoulder Instructions       General Comments      Pertinent Vitals/ Pain       Pain Assessment Pain Assessment: No/denies pain Pain Intervention(s): Monitored during session, Repositioned  Home Living                                          Prior Functioning/Environment              Frequency  Min 2X/week        Progress Toward Goals  OT Goals(current goals can now be found in the care plan section)  Progress towards OT goals: Progressing toward goals     Plan      Co-evaluation                 AM-PAC OT 6 Clicks Daily Activity     Outcome Measure   Help from another person eating meals?: None Help from another person taking care of personal grooming?: A Little Help from another person toileting, which includes using toliet, bedpan, or urinal?: A Lot Help from another person bathing (including washing, rinsing, drying)?: A Lot Help from another person to put on and taking off regular upper body clothing?: A Little Help from another person to put on and taking off regular lower body clothing?: A Lot 6 Click Score: 16    End of Session Equipment Utilized During Treatment: Gait belt;Rolling walker (2 wheels);Oxygen;Other (comment) (BSC)  OT Visit Diagnosis: Unsteadiness on feet (R26.81);Other abnormalities of gait and mobility (R26.89)   Activity Tolerance Patient  tolerated treatment well   Patient Left in bed;with call bell/phone within reach;with bed alarm set   Nurse Communication Mobility status        Time: 8642-8586 OT Time Calculation (min): 16 min  Charges: OT General Charges $OT Visit: 1 Visit OT Treatments $Self Care/Home Management : 8-22 mins    Jacques Karna Loose 08/06/2023, 3:03 PM

## 2023-08-06 NOTE — Progress Notes (Signed)
 PROGRESS NOTE  Anthony [name].  FMW:[id number] DOB: March 11, 1950 DOA: 07/26/2023 PCP: No primary care provider on file.  Consultants   Brief Narrative: 73 y.o. male with a PMH significant for interstitial lung disease, COPD, COVID infection, coronary artery disease, diastolic heart failure, paroxysmal atrial fibrillation, DVT, peripheral artery disease s/p left above-knee amputation, diabetes mellitus type 2 and hyperlipidemia.  Patient is a skilled nursing facility resident.  Patient was admitted with altered mentation, acute on chronic respiratory failure, COPD with exacerbation, and multilobar pneumonia.  Patient's daughter, Cena Potters, who happens to be patient's power of attorney, has confirmed that patient is DNR.  Has had persistently elevated O2 requirements.  Difficulty being able to titrate wean off O2.   Assessment & Plan:  Acute on chronic hypoxic respiratory failure: COPD with exacerbation/ILD: - Pulmonology consulted 7/21 due rising oxygen requirements. -  Remains on HHNC 40L, O2 sats in room today greater than 90%. -  Appreciate pulmonology input.  Steroids decreased today.   - Slow attempts at weaning oxygen.  Will likely be 1-2 weeks per Pulm - Able to transition to 4L [place] today.  Goal O2 sats >88%  A flutter/fib with AVR - New onset 7/25.  Cardiology consulted at that time. - He has now converted back to regular rhythm. BP has been low, pulse regular rhythm now.  Holding metoprolol  in light of BP 90s systolic this AM.   - Watching blood pressure and continue on his long-acting metoprolol . - Could add low-dose calcium  channel blocker if heart rate increases again. - Will continue Eliquis .  Confusion, intermittent: - Worse in the AM generally.  In PM, much more conversant and awake - DNR/DNI.  Appreciate palliative care input.  Blistering rash: - on buttocks  - new yesterday PM.  See photo in media section  - Nontender.  Not a clear dermatomal distribution.   Doesn't appear to be shingles - Barrier cream as needed.  Already appears to be fading, less vesicular than photo from yesterday.  Psuedohyponatremia: - secondary to elevated CBGs - treat DM2   Acute on chronic HFpEF - Down 12 L since admission.  Now appears euvolemic - Echo did not show any change in his ejection fraction although it was a difficult acoustical windows study. -Currently on oral Lasix  20 mg daily, will continue.  Watch for worsening swelling   Multilobar pneumonia: - Follow final cultures. - Now on cefepime  for broad gram-negative coverage -Repeat chest x-ray continues to show improvement. - Nasal swab for MRSA came back negative.  Respiratory viral panel came back negative.  DM2:   - History of the same. - CBG greater than 400 yesterday, again this AM despite increasing insulin .  Will continue to chase, watch for hypoglycemia in light of steroids being stopped.   - Appreciate diabetes coordinator input.  AKI: - Secondary to initial illness. - Continues to downtrend--> now normalized.   Leukocytosis: - Remains elevated.  High-dose steroid use as per pulm recommendations contributing-->steroids stopped, will follow.  - Being treated for multilobar pneumonia as above.  Microcytic anemia: - Seems to be a new problem for him. - Last hemoglobin we have prior to this year was 2023 and his hemoglobin has typically been high at that time. - No acute evidence of blood loss.  Will check iron studies.   Acute encephalopathy, metabolic, cannot rule out toxic: - Resolved significantly.  Patient oriented on my exam today. - Likely multifactorial.   History of PAD: - Status post left  AKA.    Goals of care:  - daughter who is medical POA - Consulted palliative care for further discussion regarding goals of care.  He is currently DNR/DNI.   DVT prophylaxis:   apixaban  (ELIQUIS ) tablet 5 mg  Code Status:   Code Status: Limited: Do not attempt resuscitation (DNR)  -DNR-LIMITED -Do Not Intubate/DNI  Level of care: Progressive Status is: Inpatient Remain in stepdown   Consults called: Pulmonology, cardiology  Subjective: Awake and alert.  Seated in chair beside bed.  Reports breathing subjectively good.  More coherent today.   Objective: Vitals:   08/06/23 1131 08/06/23 1144 08/06/23 1450 08/06/23 1513  BP: 104/64   (!) 116/58  Pulse: (!) 58 64 (!) 59 61  Resp: 19 (!) 23 19 19   Temp: 98.5 F (36.9 C)   98.7 F (37.1 C)  TempSrc: Oral   Oral  SpO2: (!) 87% (!) 89% (!) 87% (!) 89%  Weight:      Height:        Intake/Output Summary (Last 24 hours) at 08/06/2023 1532 Last data filed at 08/05/2023 2000 Gross per 24 hour  Intake 300 ml  Output 550 ml  Net -250 ml   Filed Weights   08/04/23 0354 08/05/23 0500 08/06/23 0402  Weight: 79.5 kg 78.4 kg 80.5 kg   Body mass index is 26.21 kg/m.  Gen: 73 y.o. male in no apparent distress.  Chronically ill-appearing and wearing high flow nasal cannula. Pulm: Non-labored breathing.  No accessory muscle use.  Rales right lower base.  Wearing high flow nasal cannula CV: Regular appearing rate with heart rate in the 60s GI: Abdomen soft, non-tender, non-distended Ext: Warm, no deformities, left AKA, no notable edema Skin: No rashes, lesions  Neuro: Alert and oriented to person and place today.  No focal neurological deficits. Psych: Calm     I have personally reviewed the following labs and images: CBC: Recent Labs  Lab 08/03/23 0748 08/05/23 0223 08/06/23 0910  WBC 22.8* 26.7* 26.2*  HGB 10.0* 8.5* 9.3*  HCT 36.1* 30.4* 33.8*  MCV 72.8* 72.7* 73.5*  PLT 371 368 386   BMP &GFR Recent Labs  Lab 07/31/23 0742 08/01/23 0805 08/03/23 0748 08/05/23 0223 08/06/23 0910  NA 134* 135 135 132* 135  K 4.5 4.9 4.9 4.9 4.2  CL 98 101 100 97* 98  CO2 27 27 26 26  32  GLUCOSE 280* 238* 368* 413* 176*  BUN 52* 37* 38* 41* 32*  CREATININE 1.20 0.97 1.12 1.14 0.88  CALCIUM  8.9 8.7* 8.4* 8.6*  8.6*  MG  --   --  2.1  --   --    Estimated Creatinine Clearance: 73.6 mL/min (by C-G formula based on SCr of 0.88 mg/dL). Liver & Pancreas: No results for input(s): AST, ALT, ALKPHOS, BILITOT, PROT, ALBUMIN  in the last 168 hours.  No results for input(s): LIPASE, AMYLASE in the last 168 hours. No results for input(s): AMMONIA in the last 168 hours. Diabetic: No results for input(s): HGBA1C in the last 72 hours. Recent Labs  Lab 08/05/23 0616 08/05/23 1115 08/05/23 2124 08/06/23 0855 08/06/23 1128  GLUCAP 331* 244* 217* 155* 125*   Cardiac Enzymes: No results for input(s): CKTOTAL, CKMB, CKMBINDEX, TROPONINI in the last 168 hours. No results for input(s): PROBNP in the last 8760 hours. Coagulation Profile: No results for input(s): INR, PROTIME in the last 168 hours. Thyroid  Function Tests: No results for input(s): TSH, T4TOTAL, FREET4, T3FREE, THYROIDAB in the last 72 hours. Lipid  Profile: No results for input(s): CHOL, HDL, LDLCALC, TRIG, CHOLHDL, LDLDIRECT in the last 72 hours. Anemia Panel: No results for input(s): VITAMINB12, FOLATE, FERRITIN, TIBC, IRON, RETICCTPCT in the last 72 hours. Urine analysis:    Component Value Date/Time   COLORURINE YELLOW 07/26/2023 0621   APPEARANCEUR HAZY (A) 07/26/2023 0621   LABSPEC 1.014 07/26/2023 0621   PHURINE 5.0 07/26/2023 0621   GLUCOSEU 50 (A) 07/26/2023 0621   HGBUR SMALL (A) 07/26/2023 0621   BILIRUBINUR NEGATIVE 07/26/2023 0621   KETONESUR NEGATIVE 07/26/2023 0621   PROTEINUR 30 (A) 07/26/2023 0621   UROBILINOGEN 0.2 05/05/2013 0633   NITRITE NEGATIVE 07/26/2023 0621   LEUKOCYTESUR NEGATIVE 07/26/2023 0621   Sepsis Labs: Invalid input(s): PROCALCITONIN, LACTICIDVEN  Microbiology: No results found for this or any previous visit (from the past 240 hours).   Radiology Studies: No results found.   Scheduled Meds:  apixaban   5 mg Oral BID    arformoterol   15 mcg Nebulization BID   atorvastatin   40 mg Oral Daily   budesonide  (PULMICORT ) nebulizer solution  0.5 mg Nebulization BID   escitalopram   10 mg Oral QHS   feeding supplement (GLUCERNA SHAKE)  237 mL Oral TID BM   ferrous sulfate   325 mg Oral Q breakfast   furosemide   20 mg Oral Daily   insulin  aspart  0-15 Units Subcutaneous TID WC   insulin  aspart  8 Units Subcutaneous TID WC   insulin  glargine-yfgn  40 Units Subcutaneous Daily   isosorbide  mononitrate  15 mg Oral Daily   metoprolol  succinate  12.5 mg Oral Daily   [START ON 08/07/2023] predniSONE   40 mg Oral Q breakfast   QUEtiapine   12.5 mg Oral QHS   revefenacin   175 mcg Nebulization Daily   sodium chloride  flush  10-40 mL Intracatheter Q12H   sodium chloride  flush  3 mL Intravenous Q4H while awake   traZODone   225 mg Oral QHS   Continuous Infusions:     LOS: 11 days   35 minutes with more than 50% spent in reviewing records, counseling patient/family and coordinating care.  Reyes VEAR Gaw, MD [facility] [email or web address] 08/06/2023, 3:32 PM

## 2023-08-07 DIAGNOSIS — J8410 Pulmonary fibrosis, unspecified: Secondary | ICD-10-CM | POA: Diagnosis not present

## 2023-08-07 DIAGNOSIS — D509 Iron deficiency anemia, unspecified: Secondary | ICD-10-CM

## 2023-08-07 DIAGNOSIS — J439 Emphysema, unspecified: Secondary | ICD-10-CM | POA: Diagnosis not present

## 2023-08-07 DIAGNOSIS — J9621 Acute and chronic respiratory failure with hypoxia: Secondary | ICD-10-CM | POA: Diagnosis not present

## 2023-08-07 DIAGNOSIS — J9601 Acute respiratory failure with hypoxia: Secondary | ICD-10-CM | POA: Diagnosis not present

## 2023-08-07 LAB — BASIC METABOLIC PANEL WITH GFR
Anion gap: 5 (ref 5–15)
BUN: 31 mg/dL — ABNORMAL HIGH (ref 8–23)
CO2: 29 mmol/L (ref 22–32)
Calcium: 8.2 mg/dL — ABNORMAL LOW (ref 8.9–10.3)
Chloride: 101 mmol/L (ref 98–111)
Creatinine, Ser: 0.84 mg/dL (ref 0.61–1.24)
GFR, Estimated: >60 mL/min (ref >60)
Glucose, Bld: 173 mg/dL — ABNORMAL HIGH (ref 70–99)
Potassium: 4 mmol/L (ref 3.5–5.1)
Sodium: 135 mmol/L (ref 135–145)

## 2023-08-07 LAB — CBC
HCT: 31.4 % — ABNORMAL LOW (ref 39.0–52.0)
Hemoglobin: 8.7 g/dL — ABNORMAL LOW (ref 13.0–17.0)
MCH: 20.5 pg — ABNORMAL LOW (ref 26.0–34.0)
MCHC: 27.7 g/dL — ABNORMAL LOW (ref 30.0–36.0)
MCV: 74.1 fL — ABNORMAL LOW (ref 80.0–100.0)
Platelets: 341 K/uL (ref 150–400)
RBC: 4.24 MIL/uL (ref 4.22–5.81)
RDW: 21.4 % — ABNORMAL HIGH (ref 11.5–15.5)
WBC: 17.8 K/uL — ABNORMAL HIGH (ref 4.0–10.5)
nRBC: 0 % (ref 0.0–0.2)

## 2023-08-07 LAB — GLUCOSE, CAPILLARY
Glucose-Capillary: 129 mg/dL — ABNORMAL HIGH (ref 70–99)
Glucose-Capillary: 167 mg/dL — ABNORMAL HIGH (ref 70–99)
Glucose-Capillary: 199 mg/dL — ABNORMAL HIGH (ref 70–99)
Glucose-Capillary: 240 mg/dL — ABNORMAL HIGH (ref 70–99)

## 2023-08-07 MED ORDER — VITAMIN B-12 1000 MCG PO TABS
1000.0000 ug | ORAL_TABLET | Freq: Every day | ORAL | Status: DC
  Administered 2023-08-07 – 2023-08-15 (×9): 1000 ug via ORAL
  Filled 2023-08-07 (×9): qty 1

## 2023-08-07 MED ORDER — VALACYCLOVIR HCL 500 MG PO TABS
1000.0000 mg | ORAL_TABLET | Freq: Three times a day (TID) | ORAL | Status: DC
  Administered 2023-08-07 – 2023-08-15 (×24): 1000 mg via ORAL
  Filled 2023-08-07 (×26): qty 2

## 2023-08-07 NOTE — Plan of Care (Signed)
  Problem: Nutritional: Goal: Maintenance of adequate nutrition will improve Outcome: Progressing Goal: Progress toward achieving an optimal weight will improve Outcome: Progressing   Problem: Skin Integrity: Goal: Risk for impaired skin integrity will decrease Outcome: Progressing   Problem: Clinical Measurements: Goal: Will remain free from infection Outcome: Progressing   Problem: Nutrition: Goal: Adequate nutrition will be maintained Outcome: Progressing   Problem: Coping: Goal: Level of anxiety will decrease Outcome: Progressing

## 2023-08-07 NOTE — Progress Notes (Signed)
 PROGRESS NOTE  Anthony [name].  FMW:[id number] DOB: 08-06-1950 DOA: 07/26/2023 PCP: No primary care provider on file.  Consultants   Brief Narrative: 73 y.o. male with a PMH significant for interstitial lung disease, COPD, COVID infection, coronary artery disease, diastolic heart failure, paroxysmal atrial fibrillation, DVT, peripheral artery disease s/p left above-knee amputation, diabetes mellitus type 2 and hyperlipidemia.  Patient is a skilled nursing facility resident.  Patient was admitted with altered mentation, acute on chronic respiratory failure, COPD with exacerbation, and multilobar pneumonia.  Patient's daughter, Anthony [name], who happens to be patient's power of attorney, has confirmed that patient is DNR.  Has had persistently elevated O2 requirements.  Difficulty being able to titrate wean off O2.   Assessment & Plan:  Acute on chronic hypoxic respiratory failure: COPD with exacerbation/ILD: Patient was requiring heated high flow oxygen at 40 L/min. Patient was placed on steroids.  Patient was given IV antibiotics. Pulmonology was subsequently consulted.  Based on their opinion it appears that patient will take a long time to recover.  Patient has completed course of antibiotics.  Remains on prednisone  40 mg daily although pulmonology does not think that this is of much value anymore.  Will plan to taper it off over the next 1 week. Oxygenation seems to have improved slightly.  Incentive spirometry has been encouraged. Continue to monitor closely.  A flutter/fib with AVR - New onset 7/25.  Cardiology consulted at that time. - He has now converted back to regular rhythm.  Soft blood pressures noted.  Patient is on low-dose metoprolol .  Continue to monitor. Continue Eliquis .  Blistering rash: Seems to have improved.  Did not appear to be shingles per previous rounding MD.  Psuedohyponatremia: - secondary to elevated CBGs - treat DM2   Acute on chronic HFpEF - Down 12  L since admission.  Now appears euvolemic - Echo did not show any change in his ejection fraction although it was a difficult acoustical windows study. -Currently on oral Lasix  20 mg daily, will continue.     Multilobar pneumonia: -Repeat chest x-ray continues to show improvement. - Nasal swab for MRSA came back negative.  Respiratory viral panel came back negative. Patient has completed her course of cefepime .  DM2:   - History of the same. - CBG greater than 400 yesterday, again this AM despite increasing insulin .  Will continue to chase, watch for hypoglycemia in light of steroids being stopped.   - Appreciate diabetes coordinator input.  AKI: - Secondary to initial illness. - Continues to downtrend--> now normalized.   Leukocytosis: - Remains elevated.  High-dose steroid use as per pulm recommendations contributing  Microcytic anemia/vitamin B12 deficiency Low hemoglobin noted.  No evidence of overt bleeding.  He is noted to be on anticoagulation. Anemia panel was done recently and showed ferritin of 39, iron 88, TIBC 372, percent saturation 2.  Folic acid  was 6.6.  Vitamin B12 low at 187.  He could have both B12 deficiency as well as some degree of iron deficiency. Start B12 supplementation.  He is already on iron supplementation.   Acute encephalopathy, metabolic, cannot rule out toxic: - Resolved significantly.  Patient oriented on my exam today. - Likely multifactorial.   History of PAD: - Status post left AKA.    Goals of care:  - Consulted palliative care for further discussion regarding goals of care.  He is currently DNR/DNI.   DVT prophylaxis: apixaban  (ELIQUIS ) tablet 5 mg  Code Status: DNR/DNI Family communication: No family  at bedside Disposition: SNF when medically stable.  Consults called: Pulmonology, cardiology  Subjective: Awake alert.  Cooperative.  Denies any complaints.  Shortness of breath is about the same.  No chest pain.  Objective: Vitals:    08/07/23 0749 08/07/23 0751 08/07/23 0804 08/07/23 0922  BP:   (!) 116/58 (!) 96/48  Pulse:   61 (!) 56  Resp:   19   Temp:   97.6 F (36.4 C)   TempSrc:   Oral   SpO2: 92% 93% 93%   Weight:      Height:        Intake/Output Summary (Last 24 hours) at 08/07/2023 1133 Last data filed at 08/07/2023 0807 Gross per 24 hour  Intake 370 ml  Output 1900 ml  Net -1530 ml   Filed Weights   08/05/23 0500 08/06/23 0402 08/07/23 0559  Weight: 78.4 kg 80.5 kg 79.5 kg   Body mass index is 25.88 kg/m.  General appearance: Awake alert.  In no distress Resp: Normal effort at rest.  Coarse breath sounds bilaterally.  No definite crackles or wheezing. Cardio: S1-S2 is normal regular.  No S3-S4.  No rubs murmurs or bruit GI: Abdomen is soft.  Nontender nondistended.  Bowel sounds are present normal.  No masses organomegaly Extremities: No edema.  Full range of motion of lower extremities. Neurologic:   No focal neurological deficits.    CBC: Recent Labs  Lab 08/03/23 0748 08/05/23 0223 08/06/23 0910 08/07/23 0600  WBC 22.8* 26.7* 26.2* 17.8*  HGB 10.0* 8.5* 9.3* 8.7*  HCT 36.1* 30.4* 33.8* 31.4*  MCV 72.8* 72.7* 73.5* 74.1*  PLT 371 368 386 341   BMP &GFR Recent Labs  Lab 08/01/23 0805 08/03/23 0748 08/05/23 0223 08/06/23 0910 08/07/23 0600  NA 135 135 132* 135 135  K 4.9 4.9 4.9 4.2 4.0  CL 101 100 97* 98 101  CO2 27 26 26  32 29  GLUCOSE 238* 368* 413* 176* 173*  BUN 37* 38* 41* 32* 31*  CREATININE 0.97 1.12 1.14 0.88 0.84  CALCIUM  8.7* 8.4* 8.6* 8.6* 8.2*  MG  --  2.1  --   --   --    Estimated Creatinine Clearance: 77.2 mL/min (by C-G formula based on SCr of 0.84 mg/dL).  Recent Labs  Lab 08/06/23 1128 08/06/23 1621 08/06/23 2047 08/07/23 0603 08/07/23 1131  GLUCAP 125* 203* 370* 129* 167*    Radiology Studies: No results found.   Scheduled Meds:  apixaban   5 mg Oral BID   arformoterol   15 mcg Nebulization BID   atorvastatin   40 mg Oral Daily    budesonide  (PULMICORT ) nebulizer solution  0.5 mg Nebulization BID   escitalopram   10 mg Oral QHS   feeding supplement (GLUCERNA SHAKE)  237 mL Oral TID BM   ferrous sulfate   325 mg Oral Q breakfast   furosemide   20 mg Oral Daily   insulin  aspart  0-15 Units Subcutaneous TID WC   insulin  aspart  8 Units Subcutaneous TID WC   insulin  glargine-yfgn  40 Units Subcutaneous Daily   isosorbide  mononitrate  15 mg Oral Daily   metoprolol  succinate  12.5 mg Oral Daily   predniSONE   40 mg Oral Q breakfast   QUEtiapine   12.5 mg Oral QHS   revefenacin   175 mcg Nebulization Daily   sodium chloride  flush  10-40 mL Intracatheter Q12H   sodium chloride  flush  3 mL Intravenous Q4H while awake   traZODone   225 mg Oral QHS   Continuous  Infusions:     LOS: 12 days   Joette Pebbles, MD [facility] [email or web address] 08/07/2023, 11:33 AM

## 2023-08-07 NOTE — Progress Notes (Signed)
 NAME:  Anthony Halleck., MRN:  [id number], DOB:  [birth year], LOS: 12 ADMISSION DATE:  07/26/2023, CONSULTATION DATE:  07/29/23 REFERRING MD:  Rosario CHIEF COMPLAINT:  Hypoxia   History of Present Illness:  Anthony Bilton. is a 73 y.o. male who has a PMH as below including but not limited to COPD on 3L O2, ILD, past COVID, reported CHF (though echo from 7/18 with normal EF, no diastolic dysfunction. Echo from 2023 similar), PAF on Apixaban , PAD s/p L AKA. He resides at [facility].  He was admitted 07/26/23 after a fall and was found to have dyspnea and hypoxia. He had CT chest that demonstrated known emphysema along with bilateral lower lobe and RML consolidations. RVP was negative. He was started on CTX and Azithromycin .  Since admission, he had increasing O2 requirements. Azithromycin  had been stopped after 3 days. On 7/21, he was up to 100% FiO2 and 45L/min HHFNC. PCCM was subsequently asked to see in consultation for any additional recommendations. He has been diuresed and is -4L since admit. BNP from 7/20 was 245. He has been on Solumedrol 80mg  q12hrs since admit.  Pertinent  Medical History:  has CAD (coronary artery disease); Hyperlipidemia associated with type 2 diabetes mellitus (HCC); COPD (chronic obstructive pulmonary disease) (HCC); Tobacco abuse; Mood disorder (HCC); Bilateral inguinal hernia; Umbilical hernia; S/P bilateral inguinal hernia repair; Chronic right hip pain; Recurrent left inguinal hernia; S/P right THA, AA; Moderate episode of recurrent major depressive disorder (HCC); Rectal bleeding; Obesity (BMI 30-39.9); DM2 (diabetes mellitus, type 2) (HCC); Pain medication agreement signed; Aortic atherosclerosis (HCC); Insomnia; Multiple lung nodules; Chronic fatigue; Generalized weakness; Vitamin D  deficiency; Elevated vitamin B12 level; Acute neutrophilia; Spinal stenosis of lumbar region with neurogenic claudication; Chronic constipation; Chronic pain of right knee;  Cerebellar ataxia in diseases classified elsewhere [facility]); Fall against object; Right flank pain; Chronic respiratory failure with hypoxia (HCC); Hypokalemia; Bipolar 1 disorder (HCC); Pressure injury of skin; Rib fractures; History of DVT (deep vein thrombosis); HTN (hypertension); Paroxysmal atrial fibrillation with RVR (HCC); ILD (interstitial lung disease) (HCC); Acute exacerbation of CHF (congestive heart failure) (HCC); Acute respiratory failure with hypoxemia (HCC); Community acquired pneumonia; Typical atrial flutter (HCC); and Paroxysmal atrial fibrillation (HCC) on their problem list.  Significant Hospital Events: Including procedures, antibiotic start and stop dates in addition to other pertinent events   7/18 admit 7/21 up to 45L and 100% FiO2. Back on azithromycin  in case this is legionella not getting full treatment course 7/22 frequent desaturations, back on bipap to try to re-recruit since desaturations worse while sleeping. Antibiotics escalated to cefepime .   Interim History / Subjective:   Tolerating salter HFNC 12 L Observed him eating with nasal cannula off and saturation remained 87 to 88%  Objective:  Blood pressure 109/71, pulse 63, temperature 98.1 F (36.7 C), temperature source Oral, resp. rate 18, height 5' 9 (1.753 m), weight 79.5 kg, SpO2 93%.    FiO2 (%):  [67 %] 67 %   Intake/Output Summary (Last 24 hours) at 08/07/2023 1417 Last data filed at 08/07/2023 1215 Gross per 24 hour  Intake 370 ml  Output 1900 ml  Net -1530 ml   Filed Weights   08/05/23 0500 08/06/23 0402 08/07/23 0559  Weight: 78.4 kg 80.5 kg 79.5 kg    Physical Exam: General: Chronically ill-appearing man lying in bed no acute distress Neuro: Awake, interactive, poor memory , confused baseline HEENT: [place]/AT, eyes anicteric Cardiovascular: S1-S2, regular rate and rhythm Lungs: No accessory  muscle use, crackles right base Abdomen: Obese, soft, nondistended Musculoskeletal: Left AKA, no  significant edema  Labs show normal electrolytes, decreased leukocytosis  Labs/imaging personally reviewed:  CT chest 7/18 > known emphysema along with bilateral lower lobe and RML consolidations.  Assessment & Plan:   Acute on chronic hypoxic respiratory failure -  known CPFE (combined pulmonary fibrosis emphysema] and now superimposed bilateral PNA. Highly doubt PE as he is on Apixaban  and resides at SNF so unlikely to have missed doses.  Urine strep/Legionella antigen negative  - Completed cefepime  x 7 days - -do not think steroids are helping , simply causing hyperglycemia and leukocytosis , can taper rapidly to off - Flutter valve and hypertonic saline nebs to help with pulmonary clearance , vest has been DC'd - Triple therapy bronchodilators-Brovana  and Yupelri , DuoNeb as needed -Continue efforts at weaning supplemental oxygen.  Was able to transition to 12 L salter nasal cannula, saturation 88% and above acceptable   DNR/DNI-- appropriate Palliative care assisting with goals of care Acute encephalopathy is improved but he has significant memory issues and intermittent confusion?  Undiagnosed dementia   No response to steroids, doubt IPF flare.  No escalation with DNR/DNI status.   Hope here is that worsening hypoxia is related to pneumonia which may improve with time, able to transition to salter nasal cannula , gradually taper down PCCM will be available as needed  Best practice (evaluated daily):  Per primary team.  Labs   CBC: Recent Labs  Lab 08/03/23 0748 08/05/23 0223 08/06/23 0910 08/07/23 0600  WBC 22.8* 26.7* 26.2* 17.8*  HGB 10.0* 8.5* 9.3* 8.7*  HCT 36.1* 30.4* 33.8* 31.4*  MCV 72.8* 72.7* 73.5* 74.1*  PLT 371 368 386 341    Basic Metabolic Panel: Recent Labs  Lab 08/01/23 0805 08/03/23 0748 08/05/23 0223 08/06/23 0910 08/07/23 0600  NA 135 135 132* 135 135  K 4.9 4.9 4.9 4.2 4.0  CL 101 100 97* 98 101  CO2 27 26 26  32 29  GLUCOSE 238* 368*  413* 176* 173*  BUN 37* 38* 41* 32* 31*  CREATININE 0.97 1.12 1.14 0.88 0.84  CALCIUM  8.7* 8.4* 8.6* 8.6* 8.2*  MG  --  2.1  --   --   --    GFR: Estimated Creatinine Clearance: 77.2 mL/min (by C-G formula based on SCr of 0.84 mg/dL). Recent Labs  Lab 08/03/23 0748 08/05/23 0223 08/06/23 0910 08/07/23 0600  WBC 22.8* 26.7* 26.2* 17.8*    Liver Function Tests: No results for input(s): AST, ALT, ALKPHOS, BILITOT, PROT, ALBUMIN  in the last 168 hours.    Anthony [name]  08/07/23 2:17 PM [name]  For contact information, see Amion. If no response to pager, please call PCCM consult pager. After hours, 7PM- 7AM, please call Elink.

## 2023-08-08 DIAGNOSIS — D509 Iron deficiency anemia, unspecified: Secondary | ICD-10-CM | POA: Diagnosis not present

## 2023-08-08 DIAGNOSIS — J9601 Acute respiratory failure with hypoxia: Secondary | ICD-10-CM | POA: Diagnosis not present

## 2023-08-08 LAB — GLUCOSE, CAPILLARY
Glucose-Capillary: 125 mg/dL — ABNORMAL HIGH (ref 70–99)
Glucose-Capillary: 118 mg/dL — ABNORMAL HIGH (ref 70–99)
Glucose-Capillary: 319 mg/dL — ABNORMAL HIGH (ref 70–99)
Glucose-Capillary: 256 mg/dL — ABNORMAL HIGH (ref 70–99)

## 2023-08-08 NOTE — Progress Notes (Signed)
 Palliative Medicine Progress Note   Patient Name: Anthony [name].       Date: 08/08/2023 DOB: 1950-10-27  Age: 73 y.o. MRN#: [id number] Attending Physician: Verdene Purchase, MD Primary Care Physician: No primary care provider on file. Admit Date: 07/26/2023  Reason for Consultation/Follow-up: {Reason for Consult:23484}  HPI/Patient Profile: 73 y.o. male  with past medical history of ILD, COPD, chronic respiratory failure on 3 L O2, prior COVID infection, coronary artery disease, reported CHF (though prior echo with normal EF and no diastolic dysfunction), paroxysmal atrial fibrillation, DVT, peripheral artery disease s/p left AKA, diabetes type 2, and hyperlipidemia.  He presented to the ED on 07/26/2023 after a fall and was found to have dyspnea and hypoxia.  CT chest showed known emphysema along with bilateral lower lobe and RML consolidations.  He is admitted with acute on chronic hypoxic respiratory failure; multifactorial due to COPD exacerbation and ILD along with superimposed bilateral pneumonia.  Since admission, he has had increasing O2 requirements.   Palliative Medicine has been consulted for goals of care discussions. Patient and family are faced with anticipatory care needs and complex medical decision making.   Subjective:   I completed a MOST form today. The patient and family outlined their wishes for the following treatment decisions:  Cardiopulmonary Resuscitation: Do Not Attempt Resuscitation (DNR/No CPR)  Medical Interventions: Limited Additional Interventions: Use medical treatment, IV fluids and cardiac monitoring as indicated, DO NOT USE intubation or mechanical ventilation. May consider use of less invasive airway support such as BiPAP or CPAP. Also provide comfort  measures. Transfer to the hospital if indicated. Avoid intensive care.   Antibiotics: Determine use of limitation of antibiotics when infection occurs  IV Fluids: IV fluids for a defined trial period  Feeding Tube: No feeding tube     Objective:  Physical Exam Vitals reviewed.  Constitutional:      General: He is not in acute distress.    Comments: Chronically ill-appearing  Pulmonary:     Effort: No respiratory distress.  Neurological:     Mental Status: He is alert and oriented to person, place, and time.  Psychiatric:        Cognition and Memory: Cognition is impaired. Memory is impaired.             Vital Signs: BP (!) 93/51 (  BP Location: Left Arm)   Pulse 65   Temp 97.8 F (36.6 C) (Oral)   Resp 20   Ht 5' 9 (1.753 m)   Wt 79.5 kg   SpO2 97%   BMI 25.88 kg/m  SpO2: SpO2: 97 % O2 Device: O2 Device: High Flow Nasal Cannula O2 Flow Rate: O2 Flow Rate (L/min): 15 L/min   Palliative Medicine Assessment & Plan   Assessment: Principal Problem:   Acute exacerbation of CHF (congestive heart failure) (HCC) Active Problems:   Acute respiratory failure with hypoxemia (HCC)   Community acquired pneumonia   Typical atrial flutter (HCC)   Paroxysmal atrial fibrillation (HCC)    Recommendations/Plan: Continue current scope of care Goal of care is medical optimization and return to SNF when medically stable Daughter agrees to hospice support at SNF Ongoing palliative support   Primary Decision Maker: NEXT OF KIN - Daughter/Anthony [name]   Existing Vynca/ACP Documentation: None   Code Status/Advance Care Planning: DNR - Limited   Symptom Management:  Morphine  1-2 mg IV every 4 hours as needed for pain or shortness of breath   Prognosis:  < 6 months would not be surprising  Prognosis:  {Palliative Care Prognosis:23504}  Discharge Planning: {Palliative dispostion:23505}  Care plan was discussed with ***  Thank you for allowing the Palliative Medicine Team to  assist in the care of this patient.   ***   Anthony KATHEE Loll, NP   Please contact Palliative Medicine Team phone at [phone] for questions and concerns.  For individual providers, please see AMION.

## 2023-08-08 NOTE — Progress Notes (Signed)
 Occupational Therapy Treatment Patient Details Name: Anthony [name]. MRN: [id number] DOB: Nov 24, 1950 Today's Date: 08/08/2023   History of present illness 73 y.o. male presents to [facility] 07/26/23 from [facility] after sliding off the bed with AMS and hypoxia. Admitted with acute hypoxic respiratory failure, PNA, and acute encephalopathy. PMHx: Cognitive deficit, ILD, COPD, CAD with hx inferior STEMI, RCA PCI, diastolic HF, paroxysmal A-fib, DVT, on anticoagulation, PAD with L AKA. diabetes type 2, hyperlipidemia.   OT comments  Pt readily willing to get OOB to chair. Supervision to EOB. CGA with RW to stand, steadying assist to hop to chair. Pt completed 2 grooming activities in sitting. Min assist to change gown. Set up to eat fruit and drink coffee. SpO2 93-95% on 14 L O2. Patient will benefit from continued inpatient follow up therapy, <3 hours/day.      If plan is discharge home, recommend the following:  A lot of help with bathing/dressing/bathroom;Assistance with cooking/housework;Direct supervision/assist for financial management;Supervision due to cognitive status;Assist for transportation;Help with stairs or ramp for entrance;Direct supervision/assist for medications management;A little help with walking and/or transfers   Equipment Recommendations  Other (comment) (defer)    Recommendations for Other Services      Precautions / Restrictions Precautions Precautions: Fall Precaution/Restrictions Comments: watch O2, airborne precautions for shingles Restrictions Weight Bearing Restrictions Per Provider Order: No       Mobility Bed Mobility Overal bed mobility: Needs Assistance Bed Mobility: Supine to Sit     Supine to sit: Supervision     General bed mobility comments: no c/o dizziness    Transfers Overall transfer level: Needs assistance Equipment used: Rolling walker (2 wheels) Transfers: Sit to/from Stand, Bed to chair/wheelchair/BSC Sit to Stand: Contact  guard assist     Step pivot transfers: Min assist     General transfer comment: appropriate hand placement, min steadying assist to hop to chair     Balance Overall balance assessment: Needs assistance, History of Falls   Sitting balance-Leahy Scale: Fair     Standing balance support: Bilateral upper extremity supported, Reliant on assistive device for balance, During functional activity Standing balance-Leahy Scale: Poor                             ADL either performed or assessed with clinical judgement   ADL Overall ADL's : Needs assistance/impaired Eating/Feeding: Independent;Sitting   Grooming: Wash/dry hands;Wash/dry face;Sitting;Set up                                      Extremity/Trunk Assessment              Vision       Perception     Praxis     Communication Communication Communication: Impaired Factors Affecting Communication: Hearing impaired   Cognition Arousal: Alert Behavior During Therapy: WFL for tasks assessed/performed Cognition: History of cognitive impairments                                 Following commands impaired: Follows one step commands with increased time, Only follows one step commands consistently      Cueing   Cueing Techniques: Verbal cues  Exercises      Shoulder Instructions       General Comments  Pertinent Vitals/ Pain       Pain Assessment Pain Assessment: Faces Faces Pain Scale: No hurt  Home Living                                          Prior Functioning/Environment              Frequency  Min 2X/week        Progress Toward Goals  OT Goals(current goals can now be found in the care plan section)  Progress towards OT goals: Progressing toward goals  Acute Rehab OT Goals OT Goal Formulation: With patient Time For Goal Achievement: 08/12/23 Potential to Achieve Goals: Good  Plan      Co-evaluation                  AM-PAC OT 6 Clicks Daily Activity     Outcome Measure   Help from another person eating meals?: None Help from another person taking care of personal grooming?: A Little Help from another person toileting, which includes using toliet, bedpan, or urinal?: A Lot Help from another person bathing (including washing, rinsing, drying)?: A Lot Help from another person to put on and taking off regular upper body clothing?: A Little Help from another person to put on and taking off regular lower body clothing?: A Lot 6 Click Score: 16    End of Session    OT Visit Diagnosis: Unsteadiness on feet (R26.81);Other abnormalities of gait and mobility (R26.89);Other symptoms and signs involving cognitive function;Muscle weakness (generalized) (M62.81)   Activity Tolerance Patient tolerated treatment well   Patient Left in chair;with call bell/phone within reach;with chair alarm set   Nurse Communication Mobility status        Time: 8866-8799 OT Time Calculation (min): 27 min  Charges: OT General Charges $OT Visit: 1 Visit OT Treatments $Self Care/Home Management : 8-22 mins $Therapeutic Activity: 8-22 mins  Mliss HERO, OTR/L Acute Rehabilitation Services Office: [phone]   Kennth Mliss Helling 08/08/2023, 12:15 PM

## 2023-08-08 NOTE — Progress Notes (Signed)
 Patient has removed nasal cannula multiple times and oxygen saturations dropped to 70% each time. It takes about 5 minutes of deep breathing on 15L HFNC to return saturations back to normal afterwards. Patient states that he does not know how the oxygen is coming off.  Provided education to patient. Tele camera requested. Patient placed on waiting list.

## 2023-08-08 NOTE — Plan of Care (Signed)
  Problem: Fluid Volume: Goal: Ability to maintain a balanced intake and output will improve Outcome: Progressing   Problem: Nutritional: Goal: Maintenance of adequate nutrition will improve Outcome: Progressing Goal: Progress toward achieving an optimal weight will improve Outcome: Progressing   Problem: Skin Integrity: Goal: Risk for impaired skin integrity will decrease Outcome: Progressing   Problem: Tissue Perfusion: Goal: Adequacy of tissue perfusion will improve Outcome: Progressing   Problem: Activity: Goal: Risk for activity intolerance will decrease Outcome: Progressing   Problem: Nutrition: Goal: Adequate nutrition will be maintained Outcome: Progressing   Problem: Safety: Goal: Ability to remain free from injury will improve Outcome: Progressing   Problem: Education: Goal: Ability to describe self-care measures that may prevent or decrease complications (Diabetes Survival Skills Education) will improve Outcome: Not Progressing   Problem: Coping: Goal: Ability to adjust to condition or change in health will improve Outcome: Not Progressing   Problem: Health Behavior/Discharge Planning: Goal: Ability to identify and utilize available resources and services will improve Outcome: Not Progressing Goal: Ability to manage health-related needs will improve Outcome: Not Progressing   Problem: Metabolic: Goal: Ability to maintain appropriate glucose levels will improve Outcome: Not Progressing   Problem: Education: Goal: Knowledge of General Education information will improve Description: Including pain rating scale, medication(s)/side effects and non-pharmacologic comfort measures Outcome: Not Progressing   Problem: Health Behavior/Discharge Planning: Goal: Ability to manage health-related needs will improve Outcome: Not Progressing   Problem: Clinical Measurements: Goal: Respiratory complications will improve Outcome: Not Progressing

## 2023-08-08 NOTE — TOC Progression Note (Signed)
 Transition of Care (TOC) - Progression Note    Patient Details  Name: Anthony [name]. MRN: [id number] Date of Birth: June 23, 1950  Transition of Care [facility]) CM/SW Contact  Isaiah Public, LCSWA Phone Number: 08/08/2023, 10:41 AM  Clinical Narrative:     Per progression, patient is not yet medically ready to return to [facility]. Per chart review at 10:42am patient remains on HFNC 14 liters. [facility] with SNF admissions informed CSW that they are not able to accommodate oxygen levels greater than 4L nasal cannula. CSW will continue to follow.  Expected Discharge Plan: Long Term Nursing Home Barriers to Discharge: Continued Medical Work up               Expected Discharge Plan and Services In-house Referral: Clinical Social Work   Post Acute Care Choice: Skilled Nursing Facility Living arrangements for the past 2 months: Skilled Nursing Facility                                       Social Drivers of Health (SDOH) Interventions SDOH Screenings   Food Insecurity: Patient Unable To Answer (07/26/2023)  Housing: Patient Unable To Answer (07/26/2023)  Transportation Needs: Patient Unable To Answer (07/26/2023)  Utilities: Patient Unable To Answer (07/26/2023)  Depression (PHQ2-9): Medium Risk (11/27/2019)  Financial Resource Strain: Low Risk  (09/15/2020)   Received from [facility]  Social Connections: Patient Unable To Answer (07/26/2023)  Stress: No Stress Concern Present (09/15/2020)   Received from [facility]  Tobacco Use: Medium Risk (07/26/2023)    Readmission Risk Interventions    02/13/2021    3:47 PM  Readmission Risk Prevention Plan  Transportation Screening Complete  PCP or Specialist Appt within 5-7 Days Complete  Home Care Screening Complete  Medication Review (RN CM) Complete

## 2023-08-08 NOTE — Progress Notes (Signed)
 Physical Therapy Treatment Patient Details Name: Tabias Swayze. MRN: [id number] DOB: 1950-11-30 Today's Date: 08/08/2023   History of Present Illness 73 y.o. male presents to [facility] 07/26/23 from [facility] after sliding off the bed with AMS and hypoxia. Admitted with acute hypoxic respiratory failure, PNA, and acute encephalopathy. PMHx: Cognitive deficit, ILD, COPD, CAD with hx inferior STEMI, RCA PCI, diastolic HF, paroxysmal A-fib, DVT, on anticoagulation, PAD with L AKA. diabetes type 2, hyperlipidemia.    PT Comments  Pt originally slumped over onto the pillow with feet on the floor.  Quickly aroused to name and sat up EOB.  Pt steadily progressing toward goals. Emphasis on STS's, pre-gait/standing activity with min assist in the RW and pt able to maintain 90% or greater on 14 L HFNC with activity, though not attaining greater than 95% at rest.  HR in the lower 60's to 70's bpm.  Pt returned to supine and completed 10 reps of resisted hip/knee flex/ext and bil bicep/tricep presses with graded resistance.   If plan is discharge home, recommend the following: Assistance with cooking/housework;Assist for transportation;Help with stairs or ramp for entrance;Supervision due to cognitive status;A lot of help with walking and/or transfers;A lot of help with bathing/dressing/bathroom   Can travel by private vehicle     Yes  Equipment Recommendations  None recommended by PT    Recommendations for Other Services       Precautions / Restrictions Precautions Precautions: Fall Precaution/Restrictions Comments: watch O2, airborne precautions for shingles Restrictions Weight Bearing Restrictions Per Provider Order: No     Mobility  Bed Mobility Overal bed mobility: Needs Assistance Bed Mobility: Supine to Sit     Supine to sit: Supervision Sit to supine: Contact guard assist   General bed mobility comments: no c/o dizziness    Transfers Overall transfer level: Needs  assistance Equipment used: Rolling walker (2 wheels) Transfers: Sit to/from Stand, Bed to chair/wheelchair/BSC Sit to Stand: Contact guard assist (x3)   Step pivot transfers: Min assist       General transfer comment: cued for appropriate hand placement, min steadying assist to hop to/from the bed, limited by O2 tubing    Ambulation/Gait               General Gait Details: see swing to for 2 ft F/B and sidestepping with min stability assist.   Stairs             Wheelchair Mobility     Tilt Bed    Modified Rankin (Stroke Patients Only)       Balance Overall balance assessment: Needs assistance, History of Falls   Sitting balance-Leahy Scale: Fair     Standing balance support: Bilateral upper extremity supported, Reliant on assistive device for balance, During functional activity Standing balance-Leahy Scale: Poor                              Communication Communication Communication: Impaired Factors Affecting Communication: Hearing impaired  Cognition Arousal: Alert Behavior During Therapy: WFL for tasks assessed/performed   PT - Cognitive impairments: History of cognitive impairments                         Following commands: Intact Following commands impaired: Follows one step commands with increased time, Only follows one step commands consistently    Cueing Cueing Techniques: Verbal cues  Exercises Other Exercises Other Exercises: R hip/knee flexion/ext  x10 reps with graded resistance Other Exercises: bil biceps/triceps presses x10 reps with graded resistance.    General Comments        Pertinent Vitals/Pain Pain Assessment Pain Assessment: Faces Faces Pain Scale: No hurt Pain Intervention(s): Monitored during session    Home Living                          Prior Function            PT Goals (current goals can now be found in the care plan section) Acute Rehab PT Goals PT Goal Formulation:  With patient Time For Goal Achievement: 08/23/23 Potential to Achieve Goals: Good    Frequency    Min 2X/week      PT Plan      Co-evaluation              AM-PAC PT 6 Clicks Mobility   Outcome Measure  Help needed turning from your back to your side while in a flat bed without using bedrails?: A Little Help needed moving from lying on your back to sitting on the side of a flat bed without using bedrails?: A Little Help needed moving to and from a bed to a chair (including a wheelchair)?: A Little Help needed standing up from a chair using your arms (e.g., wheelchair or bedside chair)?: A Little Help needed to walk in hospital room?: Total Help needed climbing 3-5 steps with a railing? : Total 6 Click Score: 14    End of Session Equipment Utilized During Treatment: Oxygen Activity Tolerance: Patient tolerated treatment well Patient left: in bed;with call bell/phone within reach Nurse Communication: Mobility status PT Visit Diagnosis: Other abnormalities of gait and mobility (R26.89);Muscle weakness (generalized) (M62.81);History of falling (Z91.81);Unsteadiness on feet (R26.81)     Time: 8677-8656 PT Time Calculation (min) (ACUTE ONLY): 21 min  Charges:    $Therapeutic Activity: 8-22 mins PT General Charges $$ ACUTE PT VISIT: 1 Visit                     08/08/2023  India HERO., PT Acute Rehabilitation Services [phone]  (office)   Vinie GAILS Adriana Quinby 08/08/2023, 3:15 PM

## 2023-08-08 NOTE — Progress Notes (Signed)
 PROGRESS NOTE  Anthony [name] Ruth Mickey.  FMW:[id number] DOB: 1950/07/25 DOA: 07/26/2023 PCP: No primary care provider on file.  Consultants   Brief Narrative: 73 y.o. male with a PMH significant for interstitial lung disease, COPD, COVID infection, coronary artery disease, diastolic heart failure, paroxysmal atrial fibrillation, DVT, peripheral artery disease s/p left above-knee amputation, diabetes mellitus type 2 and hyperlipidemia.  Patient is a skilled nursing facility resident.  Patient was admitted with altered mentation, acute on chronic respiratory failure, COPD with exacerbation, and multilobar pneumonia.  Patient's daughter, Cena Potters, who happens to be patient's power of attorney, has confirmed that patient is DNR.  Has had persistently elevated O2 requirements.  Difficulty being able to titrate wean off O2.   Assessment & Plan:  Acute on chronic hypoxic respiratory failure: COPD with exacerbation/ILD: Patient was requiring heated high flow oxygen at 40 L/min. Patient was placed on steroids.  Patient was given IV antibiotics. Pulmonology was subsequently consulted.  Based on their opinion it appears that patient will take a long time to recover.  Patient has completed course of antibiotics.  Remains on prednisone  40 mg daily although pulmonology does not think that this is of much value anymore.  Will plan to taper it off over the next 1 week. Stable for the most part.  Still requiring high amounts of oxygen.  Continue with incentive spirometry.  Continue to wean oxygen to maintain saturation greater than 88%.  A flutter/fib with AVR - New onset 7/25.  Cardiology consulted at that time. - He has now converted back to regular rhythm.  Soft blood pressures noted.  Patient is on low-dose metoprolol .  Continue to monitor. Continue Eliquis .  Herpes zoster Noted to have rash in the buttock area.  Etiology initially not clear.  Very suspicious for herpes zoster.  Started on Valtrex   yesterday.  Continue to monitor.    Acute on chronic HFpEF - Down 12 L since admission.  Now appears euvolemic - Echo did not show any change in his ejection fraction although it was a difficult acoustical windows study. -Currently on oral Lasix  20 mg daily, will continue.     Multilobar pneumonia: -Repeat chest x-ray continues to show improvement. - Nasal swab for MRSA came back negative.  Respiratory viral panel came back negative. Patient has completed course of cefepime .  DM2:   Monitor CBGs.  Noted to be on SSI and glargine.  As steroid is tapered down insulin  requirements should decrease.  HbA1c 6.5.  AKI: - Secondary to initial illness. - Continues to downtrend--> now normalized.   Leukocytosis: - Remains elevated.  High-dose steroid use as per pulm recommendations contributing  Psuedohyponatremia: - secondary to elevated CBGs - treat DM2   Microcytic anemia/vitamin B12 deficiency Low hemoglobin noted.  No evidence of overt bleeding.  He is noted to be on anticoagulation. Anemia panel was done recently and showed ferritin of 39, iron 88, TIBC 372, percent saturation 2.  Folic acid  was 6.6.  Vitamin B12 low at 187.  He could have both B12 deficiency as well as some degree of iron deficiency. Start B12 supplementation.  He is already on iron supplementation.   Acute encephalopathy, metabolic, cannot rule out toxic: Likely multifactorial.  Now resolved.   History of PAD: - Status post left AKA.    Goals of care:  Consulted palliative care for further discussion regarding goals of care.  He is currently DNR/DNI.  DVT prophylaxis: apixaban  (ELIQUIS ) tablet 5 mg  Code Status: DNR/DNI Family communication: No  family at bedside Disposition: SNF when medically stable.  Consults called: Pulmonology, cardiology  Subjective: Patient is awake alert.  Mentions that his shortness of breath is about the same.  Denies any new complaints.  Objective: Vitals:   08/08/23 0254  08/08/23 0535 08/08/23 0755 08/08/23 0840  BP:  (!) 97/54 114/72   Pulse: (!) 101 70 63   Resp:   16   Temp:  98 F (36.7 C) 99 F (37.2 C)   TempSrc:  Oral Oral   SpO2: 90% 95% 95% (!) 88%  Weight:      Height:        Intake/Output Summary (Last 24 hours) at 08/08/2023 0959 Last data filed at 08/08/2023 0542 Gross per 24 hour  Intake 480 ml  Output 2050 ml  Net -1570 ml   Filed Weights   08/05/23 0500 08/06/23 0402 08/07/23 0559  Weight: 78.4 kg 80.5 kg 79.5 kg   Body mass index is 25.88 kg/m.   General appearance: Awake alert.  In no distress Resp: Coarse breath sounds bilaterally.  No definite crackles or wheezing. Cardio: S1-S2 is normal regular.  No S3-S4.  No rubs murmurs or bruit GI: Abdomen is soft.  Nontender nondistended.  Bowel sounds are present normal.  No masses organomegaly Extremities: No edema.  Full range of motion of lower extremities. Neurologic: Focal neurological deficits.   CBC: Recent Labs  Lab 08/03/23 0748 08/05/23 0223 08/06/23 0910 08/07/23 0600  WBC 22.8* 26.7* 26.2* 17.8*  HGB 10.0* 8.5* 9.3* 8.7*  HCT 36.1* 30.4* 33.8* 31.4*  MCV 72.8* 72.7* 73.5* 74.1*  PLT 371 368 386 341   BMP &GFR Recent Labs  Lab 08/03/23 0748 08/05/23 0223 08/06/23 0910 08/07/23 0600  NA 135 132* 135 135  K 4.9 4.9 4.2 4.0  CL 100 97* 98 101  CO2 26 26 32 29  GLUCOSE 368* 413* 176* 173*  BUN 38* 41* 32* 31*  CREATININE 1.12 1.14 0.88 0.84  CALCIUM  8.4* 8.6* 8.6* 8.2*  MG 2.1  --   --   --    Estimated Creatinine Clearance: 77.2 mL/min (by C-G formula based on SCr of 0.84 mg/dL).  Recent Labs  Lab 08/07/23 0603 08/07/23 1131 08/07/23 1509 08/07/23 2251 08/08/23 0753  GLUCAP 129* 167* 199* 240* 125*    Radiology Studies: No results found.   Scheduled Meds:  apixaban   5 mg Oral BID   arformoterol   15 mcg Nebulization BID   atorvastatin   40 mg Oral Daily   budesonide  (PULMICORT ) nebulizer solution  0.5 mg Nebulization BID   vitamin  B-12  1,000 mcg Oral Daily   escitalopram   10 mg Oral QHS   feeding supplement (GLUCERNA SHAKE)  237 mL Oral TID BM   ferrous sulfate   325 mg Oral Q breakfast   furosemide   20 mg Oral Daily   insulin  aspart  0-15 Units Subcutaneous TID WC   insulin  aspart  8 Units Subcutaneous TID WC   insulin  glargine-yfgn  40 Units Subcutaneous Daily   isosorbide  mononitrate  15 mg Oral Daily   metoprolol  succinate  12.5 mg Oral Daily   predniSONE   40 mg Oral Q breakfast   QUEtiapine   12.5 mg Oral QHS   revefenacin   175 mcg Nebulization Daily   sodium chloride  flush  10-40 mL Intracatheter Q12H   sodium chloride  flush  3 mL Intravenous Q4H while awake   traZODone   225 mg Oral QHS   valACYclovir   1,000 mg Oral TID   Continuous  Infusions:     LOS: 13 days   Joette Pebbles, MD [facility] [email or web address] 08/08/2023, 9:59 AM

## 2023-08-08 NOTE — Progress Notes (Signed)
 Patient is AXOX1-2 with no complaints of pain at this time. Patient remains on HFNC 15L. Patient has made multiple successful attempts at removing HFNC. Patient is able to follow commands however patient demonstrates a poor short term memory, poor judgement, and poor safety awareness. Patient is given education and is agreeable to education however, patient continues to show poor judgement and safety awareness in act of removing nasal cannula. Per report, virtual sitter requested however there was not one available. Charge nurse aware of safety situation. Patient is not within line of site as patient is in isolation room. Telemetry made aware of patient's removal of oxygen this shift.   Bed alarm remains on, and floor mats in place.

## 2023-08-08 NOTE — Progress Notes (Deleted)
 Patient refused midnight Potassium draw. Reports being stuck more than twice. Would not let another phlebotomist attempt blood collection at this time. Agreed to have 0400 laboratory draw but ONE TIME  Juleen Distel, BSN, RN

## 2023-08-09 DIAGNOSIS — J9601 Acute respiratory failure with hypoxia: Secondary | ICD-10-CM | POA: Diagnosis not present

## 2023-08-09 DIAGNOSIS — D509 Iron deficiency anemia, unspecified: Secondary | ICD-10-CM | POA: Diagnosis not present

## 2023-08-09 LAB — CBC
HCT: 30.7 % — ABNORMAL LOW (ref 39.0–52.0)
Hemoglobin: 8.6 g/dL — ABNORMAL LOW (ref 13.0–17.0)
MCH: 21 pg — ABNORMAL LOW (ref 26.0–34.0)
MCHC: 28 g/dL — ABNORMAL LOW (ref 30.0–36.0)
MCV: 74.9 fL — ABNORMAL LOW (ref 80.0–100.0)
Platelets: 322 K/uL (ref 150–400)
RBC: 4.1 MIL/uL — ABNORMAL LOW (ref 4.22–5.81)
RDW: 22.6 % — ABNORMAL HIGH (ref 11.5–15.5)
WBC: 17.3 K/uL — ABNORMAL HIGH (ref 4.0–10.5)
nRBC: 0 % (ref 0.0–0.2)

## 2023-08-09 LAB — GLUCOSE, CAPILLARY
Glucose-Capillary: 134 mg/dL — ABNORMAL HIGH (ref 70–99)
Glucose-Capillary: 154 mg/dL — ABNORMAL HIGH (ref 70–99)
Glucose-Capillary: 353 mg/dL — ABNORMAL HIGH (ref 70–99)
Glucose-Capillary: 329 mg/dL — ABNORMAL HIGH (ref 70–99)
Glucose-Capillary: 322 mg/dL — ABNORMAL HIGH (ref 70–99)

## 2023-08-09 LAB — LACTIC ACID, PLASMA: Lactic Acid, Venous: 1.2 mmol/L (ref 0.5–1.9)

## 2023-08-09 MED ORDER — SODIUM CHLORIDE 0.9 % IV BOLUS: 500.0000 mL | Freq: Once | INTRAVENOUS | Status: DC

## 2023-08-09 MED ORDER — PREDNISONE 20 MG PO TABS
30.0000 mg | ORAL_TABLET | Freq: Every day | ORAL | Status: DC
  Administered 2023-08-09 – 2023-08-11 (×3): 30 mg via ORAL
  Filled 2023-08-09 (×3): qty 1

## 2023-08-09 MED ORDER — OLOPATADINE HCL 0.1 % OP SOLN
1.0000 [drp] | Freq: Two times a day (BID) | OPHTHALMIC | Status: DC
  Administered 2023-08-09 – 2023-08-15 (×13): 1 [drp] via OPHTHALMIC
  Filled 2023-08-09: qty 5

## 2023-08-09 MED ORDER — INSULIN GLARGINE-YFGN 100 UNIT/ML ~~LOC~~ SOLN
30.0000 [IU] | Freq: Every day | SUBCUTANEOUS | Status: DC
  Administered 2023-08-09 – 2023-08-15 (×7): 30 [IU] via SUBCUTANEOUS
  Filled 2023-08-09 (×7): qty 0.3

## 2023-08-09 MED ORDER — INSULIN GLARGINE-YFGN 100 UNIT/ML ~~LOC~~ SOLN: 30.0000 [IU] | Freq: Every day | SUBCUTANEOUS | Status: DC

## 2023-08-09 NOTE — Progress Notes (Signed)
 Patient continues to make multiple attempts to remove oxygen from nose causing patient to desaturate despite sitter at bedside. Patient reeducated on need for supplemental oxygen. Patient verbalizes understanding but does not show ability to retain/learn information given.

## 2023-08-09 NOTE — Progress Notes (Signed)
 PROGRESS NOTE  Anthony [name] Ruth Mickey.  FMW:[id number] DOB: 07/31/1950 DOA: 07/26/2023 PCP: No primary care provider on file.    Brief Narrative: 73 y.o. male with a PMH significant for interstitial lung disease, COPD, COVID infection, coronary artery disease, diastolic heart failure, paroxysmal atrial fibrillation, DVT, peripheral artery disease s/p left above-knee amputation, diabetes mellitus type 2 and hyperlipidemia.  Patient is a skilled nursing facility resident.  Patient was admitted with altered mentation, acute on chronic respiratory failure, COPD with exacerbation, and multilobar pneumonia.  Patient's daughter, Cena Potters, who happens to be patient's power of attorney, has confirmed that patient is DNR.  Has had persistently elevated O2 requirements.  Difficulty being able to titrate wean off O2.   Assessment & Plan:  Acute on chronic hypoxic respiratory failure: COPD with exacerbation/ILD: Patient was requiring heated high flow oxygen at 40 L/min. Patient was placed on steroids.  Patient was given IV antibiotics. Pulmonology was subsequently consulted.  Based on their opinion it appears that patient will take a long time to recover.  Patient has completed course of antibiotics.  Remains on prednisone  40 mg daily although pulmonology does not think that this is of much value anymore.  Will plan to taper it off over the next 1 week. Patient is stable for the most part.  Oxygenation appears to have improved in the last 48 hours.  Hopefully will be able to wean him down on his oxygen. Continue to wean oxygen to maintain saturation greater than 88%.  A flutter/fib with AVR New onset 7/25.  Cardiology consulted at that time. He has now converted back to regular rhythm.  Patient was on low-dose metoprolol  however blood pressure noted to be low.  Metoprolol  held for now.  He is also noted to be bradycardic. Continue Eliquis .  Continue to monitor on telemetry.  Herpes zoster Noted to have  rash in the buttock area.  Etiology initially not clear.  Very suspicious for herpes zoster.  Started on Valtrex .  Lesions are stable to slightly improved.   Acute on chronic HFpEF Down 12 L since admission.  Now appears euvolemic Echo did not show any change in his ejection fraction although it was a difficult acoustical windows study. Has been in significant negative fluid balance.  Due to low blood pressures we will hold his furosemide  for now.   Multilobar pneumonia: Repeat chest x-ray continues to show improvement. Nasal swab for MRSA came back negative.  Respiratory viral panel came back negative. Patient has completed course of cefepime .  DM2:   Monitor CBGs.  Noted to be on SSI and glargine.  As steroid is tapered down insulin  requirements should decrease.  HbA1c 6.5.  AKI: Secondary to initial illness. Continues to downtrend--> now normalized.   Leukocytosis: Probably secondary to steroids.  Microcytic anemia/vitamin B12 deficiency Low hemoglobin noted.  No evidence of overt bleeding.  He is noted to be on anticoagulation. Anemia panel was done recently and showed ferritin of 39, iron 88, TIBC 372, percent saturation 2.  Folic acid  was 6.6.  Vitamin B12 low at 187.   He could have both B12 deficiency as well as some degree of iron deficiency. Started on B12 supplementation.  He is already on iron supplementation.   Acute encephalopathy, metabolic, cannot rule out toxic: Likely multifactorial.  Now resolved.   History of PAD: Status post left AKA.    Goals of care:  Consulted palliative care for further discussion regarding goals of care.  He is currently DNR/DNI.  DVT  prophylaxis: apixaban  (ELIQUIS ) tablet 5 mg  Code Status: DNR/DNI Family communication: No family at bedside Disposition: SNF when medically stable.  Consults called: Pulmonology, cardiology  Subjective: Patient is awake alert.  Denies any complaints.  Mentions that his breathing is slightly better  today compared to yesterday.  Objective: Vitals:   08/09/23 0438 08/09/23 0800 08/09/23 0828 08/09/23 0830  BP:  106/67 (!) 105/54   Pulse:  60 (!) 58 (!) 57  Resp:   20   Temp:   (!) 97.5 F (36.4 C)   TempSrc:   Oral   SpO2:  98% 97% 94%  Weight: 79.7 kg     Height:        Intake/Output Summary (Last 24 hours) at 08/09/2023 1014 Last data filed at 08/09/2023 0800 Gross per 24 hour  Intake 370 ml  Output 1100 ml  Net -730 ml   Filed Weights   08/06/23 0402 08/07/23 0559 08/09/23 0438  Weight: 80.5 kg 79.5 kg 79.7 kg   Body mass index is 25.95 kg/m.  General appearance: Awake alert.  In no distress Resp: Clear to auscultation bilaterally.  Normal effort Cardio: S1-S2 is normal regular.  No S3-S4.  No rubs murmurs or bruit GI: Abdomen is soft.  Nontender nondistended.  Bowel sounds are present normal.  No masses organomegaly No obvious focal neurological deficits.   CBC: Recent Labs  Lab 08/03/23 0748 08/05/23 0223 08/06/23 0910 08/07/23 0600 08/09/23 0248  WBC 22.8* 26.7* 26.2* 17.8* 17.3*  HGB 10.0* 8.5* 9.3* 8.7* 8.6*  HCT 36.1* 30.4* 33.8* 31.4* 30.7*  MCV 72.8* 72.7* 73.5* 74.1* 74.9*  PLT 371 368 386 341 322   BMP &GFR Recent Labs  Lab 08/03/23 0748 08/05/23 0223 08/06/23 0910 08/07/23 0600  NA 135 132* 135 135  K 4.9 4.9 4.2 4.0  CL 100 97* 98 101  CO2 26 26 32 29  GLUCOSE 368* 413* 176* 173*  BUN 38* 41* 32* 31*  CREATININE 1.12 1.14 0.88 0.84  CALCIUM  8.4* 8.6* 8.6* 8.2*  MG 2.1  --   --   --    Estimated Creatinine Clearance: 77.2 mL/min (by C-G formula based on SCr of 0.84 mg/dL).  Recent Labs  Lab 08/08/23 0753 08/08/23 1133 08/08/23 1613 08/08/23 2044 08/09/23 0821  GLUCAP 125* 118* 319* 256* 134*    Radiology Studies: No results found.   Scheduled Meds:  apixaban   5 mg Oral BID   arformoterol   15 mcg Nebulization BID   atorvastatin   40 mg Oral Daily   budesonide  (PULMICORT ) nebulizer solution  0.5 mg Nebulization BID    vitamin B-12  1,000 mcg Oral Daily   escitalopram   10 mg Oral QHS   feeding supplement (GLUCERNA SHAKE)  237 mL Oral TID BM   ferrous sulfate   325 mg Oral Q breakfast   insulin  aspart  0-15 Units Subcutaneous TID WC   insulin  aspart  8 Units Subcutaneous TID WC   insulin  glargine-yfgn  40 Units Subcutaneous Daily   predniSONE   40 mg Oral Q breakfast   QUEtiapine   12.5 mg Oral QHS   revefenacin   175 mcg Nebulization Daily   sodium chloride  flush  10-40 mL Intracatheter Q12H   sodium chloride  flush  3 mL Intravenous Q4H while awake   traZODone   225 mg Oral QHS   valACYclovir   1,000 mg Oral TID   Continuous Infusions:  sodium chloride         LOS: 14 days   Suzan Manon, MD [facility]  [email or web address] 08/09/2023, 10:14 AM

## 2023-08-09 NOTE — Progress Notes (Signed)
 Physical Therapy Treatment Patient Details Name: Anthony [name]. MRN: [id number] DOB: 07/21/1950 Today's Date: 08/09/2023   History of Present Illness 73 y.o. male presents to [facility] 07/26/23 from [place] nursing home after sliding off the bed with AMS and hypoxia. Admitted with acute hypoxic respiratory failure, PNA, and acute encephalopathy. PMHx: Cognitive deficit, ILD, COPD, CAD with hx inferior STEMI, RCA PCI, diastolic HF, paroxysmal A-fib, DVT, on anticoagulation, PAD with L AKA. diabetes type 2, hyperlipidemia.    PT Comments  Pt is progressing steadily toward goals and when mobile shows good sats  in the low 90's where as while dozing, the sats drop into mid to lower 80's.  Emphasis on exercise with resistance incl hip/knee flex/ext bil and bicep tricep presses bil x 10 reps plus 3 squat pivot transfers at CGA level with pt showing good control.     If plan is discharge home, recommend the following: Assistance with cooking/housework;Assist for transportation;Help with stairs or ramp for entrance;Supervision due to cognitive status;A lot of help with walking and/or transfers;A lot of help with bathing/dressing/bathroom   Can travel by private vehicle     Yes  Equipment Recommendations  None recommended by PT    Recommendations for Other Services       Precautions / Restrictions Precautions Precautions: Fall Precaution/Restrictions Comments: watch O2, airborne precautions for shingles Restrictions Weight Bearing Restrictions Per Provider Order: No     Mobility  Bed Mobility Overal bed mobility: Needs Assistance Bed Mobility: Supine to Sit     Supine to sit: Supervision Sit to supine: Contact guard assist, Supervision   General bed mobility comments: no c/o dizziness,  flat bed, no use of the rails    Transfers Overall transfer level: Needs assistance Equipment used: Rolling walker (2 wheels) Transfers: Sit to/from Stand, Bed to chair/wheelchair/BSC Sit to Stand:  Contact guard assist (x3)     Squat pivot transfers: Contact guard assist (x3 bed to chair to bed back to the chair.)     General transfer comment: minimal cues and pt completed transfer appropriately.  pt leads with intact LE no matter which side the chair is on.    Ambulation/Gait                   Stairs             Wheelchair Mobility     Tilt Bed    Modified Rankin (Stroke Patients Only)       Balance Overall balance assessment: Needs assistance, History of Falls Sitting-balance support: No upper extremity supported, Feet supported Sitting balance-Leahy Scale: Fair     Standing balance support: Bilateral upper extremity supported, During functional activity, Single extremity supported Standing balance-Leahy Scale: Poor Standing balance comment: Is reliant on a stable surface, but otherwise transfers well.                            Communication Communication Communication: Impaired Factors Affecting Communication: Hearing impaired  Cognition Arousal: Alert Behavior During Therapy: WFL for tasks assessed/performed   PT - Cognitive impairments: History of cognitive impairments                         Following commands: Intact Following commands impaired: Follows one step commands with increased time, Only follows one step commands consistently    Cueing Cueing Techniques: Verbal cues  Exercises Other Exercises Other Exercises: R hip/knee flexion/ext x10 reps with  graded resistance Other Exercises: bil biceps/triceps presses x10 reps with graded resistance.    General Comments General comments (skin integrity, edema, etc.): Once awake and mobile, sats were consistently 92-93% on 8L [address]      Pertinent Vitals/Pain Pain Assessment Pain Assessment: Faces Faces Pain Scale: No hurt Pain Intervention(s): Monitored during session    Home Living                          Prior Function            PT Goals  (current goals can now be found in the care plan section) Acute Rehab PT Goals Patient Stated Goal: to feel better PT Goal Formulation: With patient Time For Goal Achievement: 08/23/23 Potential to Achieve Goals: Good Progress towards PT goals: Progressing toward goals    Frequency    Min 2X/week      PT Plan      Co-evaluation              AM-PAC PT 6 Clicks Mobility   Outcome Measure  Help needed turning from your back to your side while in a flat bed without using bedrails?: A Little Help needed moving from lying on your back to sitting on the side of a flat bed without using bedrails?: A Little Help needed moving to and from a bed to a chair (including a wheelchair)?: A Little Help needed standing up from a chair using your arms (e.g., wheelchair or bedside chair)?: A Little Help needed to walk in hospital room?: Total Help needed climbing 3-5 steps with a railing? : Total 6 Click Score: 14    End of Session Equipment Utilized During Treatment: Oxygen Activity Tolerance: Patient tolerated treatment well Patient left: in bed;with call bell/phone within reach Nurse Communication: Mobility status PT Visit Diagnosis: Other abnormalities of gait and mobility (R26.89);Muscle weakness (generalized) (M62.81);History of falling (Z91.81);Unsteadiness on feet (R26.81)     Time: 8661-8595 PT Time Calculation (min) (ACUTE ONLY): 26 min  Charges:    $Therapeutic Exercise: 8-22 mins $Therapeutic Activity: 8-22 mins PT General Charges $$ ACUTE PT VISIT: 1 Visit                     08/09/2023  India HERO., PT Acute Rehabilitation Services [phone]  (office)   Vinie GAILS Dessire Grimes 08/09/2023, 2:16 PM

## 2023-08-09 NOTE — Progress Notes (Signed)
 Patient is alert and oriented to full name, DOB, and that he is in the hospital. Patient is forgetful and needs frequent reorientation techniques given.   Patient remains on 6L HFNC. Sitter at door.   Safety measures are in place, call light within reach, bed alarm on, and 4P's addressed.

## 2023-08-09 NOTE — TOC Progression Note (Signed)
 Transition of Care (TOC) - Progression Note    Patient Details  Name: Anthony [name]. MRN: [id number] Date of Birth: 1950/05/13  Transition [facility]) CM/SW Contact  Isaiah Public, LCSWA Phone Number: 08/09/2023, 11:07 AM  Clinical Narrative:     Due to patients current orientation CSW spoke with patients daughter Anthony. [name] confirmed plan is for patient to return to [facility] when medically stable for dc. Palliative currently recommending hospice services to follow patient at SNF.[name] informed CSW she spoke with Recardo NP with Palliative and that she is agreeable for hospice services to follow patient at SNF. [name] gave CSW permission to make referral to [facility] for hospice services to follow patient at SNF closer to patient being medically ready for dc. CSW will continue to follow and assist with patients dc planning needs.   Expected Discharge Plan: Long Term Nursing Home Barriers to Discharge: Continued Medical Work up               Expected Discharge Plan and Services In-house Referral: Clinical Social Work   Post Acute Care Choice: Skilled Nursing Facility Living arrangements for the past 2 months: Skilled Nursing Facility                                       Social Drivers of Health (SDOH) Interventions SDOH Screenings   Food Insecurity: Patient Unable To Answer (07/26/2023)  Housing: Patient Unable To Answer (07/26/2023)  Transportation Needs: Patient Unable To Answer (07/26/2023)  Utilities: Patient Unable To Answer (07/26/2023)  Depression (PHQ2-9): Medium Risk (11/27/2019)  Financial Resource Strain: Low Risk  (09/15/2020)   Received from [facility]  Social Connections: Patient Unable To Answer (07/26/2023)  Stress: No Stress Concern Present (09/15/2020)   Received from [facility]  Tobacco Use: Medium Risk (07/26/2023)    Readmission Risk Interventions    02/13/2021    3:47 PM  Readmission Risk Prevention Plan   Transportation Screening Complete  PCP or Specialist Appt within 5-7 Days Complete  Home Care Screening Complete  Medication Review (RN CM) Complete

## 2023-08-09 NOTE — Progress Notes (Signed)
 Patient became bradycardiac and hypotensive this shift. Per previous data, patient has been bradycardic with similar trends previous shifts this visit, MD aware.   Patient has remained asymptomatic at this time however blood pressures are trending down, MD aware. Manual BP obtained see flowsheets. STAT lab work order obtained. Lab and phlebotomy called multiple times for lab collection, charge RN made aware.   MD updated on manual BP results.   Safety measures are in place.

## 2023-08-10 DIAGNOSIS — D509 Iron deficiency anemia, unspecified: Secondary | ICD-10-CM | POA: Diagnosis not present

## 2023-08-10 DIAGNOSIS — J9601 Acute respiratory failure with hypoxia: Secondary | ICD-10-CM | POA: Diagnosis not present

## 2023-08-10 LAB — GLUCOSE, CAPILLARY
Glucose-Capillary: 120 mg/dL — ABNORMAL HIGH (ref 70–99)
Glucose-Capillary: 176 mg/dL — ABNORMAL HIGH (ref 70–99)
Glucose-Capillary: 200 mg/dL — ABNORMAL HIGH (ref 70–99)
Glucose-Capillary: 102 mg/dL — ABNORMAL HIGH (ref 70–99)

## 2023-08-10 MED ORDER — INSULIN ASPART 100 UNIT/ML IJ SOLN
0.0000 [IU] | Freq: Every day | INTRAMUSCULAR | Status: DC
  Administered 2023-08-11: 3 [IU] via SUBCUTANEOUS
  Administered 2023-08-13: 2 [IU] via SUBCUTANEOUS

## 2023-08-10 MED ORDER — INSULIN ASPART 100 UNIT/ML IJ SOLN
0.0000 [IU] | Freq: Three times a day (TID) | INTRAMUSCULAR | Status: DC
  Administered 2023-08-10 (×2): 3 [IU] via SUBCUTANEOUS
  Administered 2023-08-11: 5 [IU] via SUBCUTANEOUS
  Administered 2023-08-11: 3 [IU] via SUBCUTANEOUS
  Administered 2023-08-11: 5 [IU] via SUBCUTANEOUS
  Administered 2023-08-12: 2 [IU] via SUBCUTANEOUS
  Administered 2023-08-12 (×2): 5 [IU] via SUBCUTANEOUS
  Administered 2023-08-13: 8 [IU] via SUBCUTANEOUS
  Administered 2023-08-13 (×2): 3 [IU] via SUBCUTANEOUS
  Administered 2023-08-14: 5 [IU] via SUBCUTANEOUS
  Administered 2023-08-14: 2 [IU] via SUBCUTANEOUS
  Administered 2023-08-14: 5 [IU] via SUBCUTANEOUS
  Administered 2023-08-15: 2 [IU] via SUBCUTANEOUS

## 2023-08-10 MED ORDER — ZINC OXIDE 40 % EX OINT
TOPICAL_OINTMENT | CUTANEOUS | Status: AC
  Filled 2023-08-10: qty 57

## 2023-08-10 NOTE — Consult Note (Signed)
 WOC Nurse Consult Note: Reason for Consult: Requested to assess a partial thickness on penile skin due a device use. Wound type: MARSI, skin tear. Pressure Injury POA: NA Discussed the case with bed nurse. Measurement: See flowsheet. Wound bed: 100% pink. Drainage (amount, consistency, odor) scant amount. Periwound: intact. Dressing procedure/placement/frequency: Cleanse with warm water  and wash cloth. Apply Desitin cream twice a day or PRN. Be sure to remove the all cream before reapply.  WOC team will not plan to follow further. Please reconsult if further assistance is needed. Thank-you,  Lela Holm BSN, CNS, RN, [employer], WOCN  (Phone [phone])

## 2023-08-10 NOTE — Consult Note (Signed)
 WOC Nurse Consult Note: Reason for Consult: requested to assess a injury on penile skin. [place] with bed nurse, she will evaluate him and make sure that a WOC consult is needed.  Please note that the [facility] nursing team is utilizing a standardized work plan to manage patient consults. We are triaging consults and will try to see the patients within 48 hours. Wound photos in the patient's chart allow us  to consult on the patient in the most efficient and timely manner.    Please reconsult if further assistance is needed. Thank-you,  Lela Holm BSN, CNS, RN, [employer], WOCN  (Phone [phone])

## 2023-08-10 NOTE — Progress Notes (Signed)
 Patient saturations 84% on 5L. Patient states he does not feel short of breath. Stayed in room with patient and titrated oxygen up to 10L HFNC to maintain oxygen saturations >88%. Will titrate down again as able.

## 2023-08-10 NOTE — Progress Notes (Signed)
 PROGRESS NOTE  Anthony [name] Ruth Mickey.  FMW:[id number] DOB: 03/11/1950 DOA: 07/26/2023 PCP: No primary care provider on file.    Brief Narrative: 73 y.o. male with a PMH significant for interstitial lung disease, COPD, COVID infection, coronary artery disease, diastolic heart failure, paroxysmal atrial fibrillation, DVT, peripheral artery disease s/p left above-knee amputation, diabetes mellitus type 2 and hyperlipidemia.  Patient is a skilled nursing facility resident.  Patient was admitted with altered mentation, acute on chronic respiratory failure, COPD with exacerbation, and multilobar pneumonia.  Patient's daughter, Cena Potters, who happens to be patient's power of attorney, has confirmed that patient is DNR.     Assessment & Plan:  Acute on chronic hypoxic respiratory failure: COPD with exacerbation/ILD: Patient was requiring heated high flow oxygen at 40 L/min. Patient was placed on steroids.  Patient was given IV antibiotics. Pulmonology was subsequently consulted.  Based on their opinion it appears that patient will take a long time to recover.  Patient has completed course of antibiotics.  Remains on prednisone  40 mg daily although pulmonology does not think that this is of much value anymore.  Will plan to taper it off over the 1 week. Patient has shown improvement in his respiratory status and oxygen requirements.  He is now down to 6 L of oxygen by nasal cannula with saturations in the mid 90s.  Continue to wean down to maintain saturation greater than 88%.  Continue incentive spirometry.  A flutter/fib with AVR New onset 7/25.  Cardiology consulted at that time. He has now converted back to regular rhythm.  Metoprolol  had to be held due to significant bradycardia.  Stable this morning. Continue Eliquis .  Continue to monitor on telemetry.  Herpes zoster Noted to have rash in the buttock area.  Etiology initially not clear.  Very suspicious for herpes zoster.  Started on Valtrex .   Lesions are stable to slightly improved.  Acute on chronic HFpEF Down 12 L since admission.  Now appears euvolemic Echo did not show any change in his ejection fraction although it was a difficult acoustical windows study. Has been in significant negative fluid balance.   Furosemide  was held due to low blood pressures.  Blood pressure has improved.   Multilobar pneumonia: Repeat chest x-ray continues to show improvement. Nasal swab for MRSA came back negative.  Respiratory viral panel came back negative. Patient has completed course of cefepime .  DM2:   Monitor CBGs.  Noted to be on SSI and glargine.  As steroid is tapered down insulin  requirements should decrease.  HbA1c 6.5.  AKI: Secondary to initial illness. Continues to downtrend--> now normalized.   Leukocytosis: Probably secondary to steroids.  Microcytic anemia/vitamin B12 deficiency Low hemoglobin noted.  No evidence of overt bleeding.  He is noted to be on anticoagulation. Anemia panel was done recently and showed ferritin of 39, iron 88, TIBC 372, percent saturation 2.  Folic acid  was 6.6.  Vitamin B12 low at 187.   He could have both B12 deficiency as well as some degree of iron deficiency. Started on B12 supplementation.  He is already on iron supplementation.   Acute encephalopathy, metabolic, cannot rule out toxic: Likely multifactorial.  Now resolved.   History of PAD: Status post left AKA.    Goals of care:  Consulted palliative care for further discussion regarding goals of care.  He is currently DNR/DNI.  DVT prophylaxis: apixaban  (ELIQUIS ) tablet 5 mg  Code Status: DNR/DNI Family communication: No family at bedside.  Will update daughter later  today. Disposition: SNF when medically stable.  Consults called: Pulmonology, cardiology  Subjective: Patient is awake alert.  Denies any discomfort.  No shortness of breath.  No pain issues.  Objective: Vitals:   08/10/23 0450 08/10/23 0714 08/10/23 0758  08/10/23 0800  BP: 119/69 (!) 107/58  115/62  Pulse: (!) 57  63 (!) 56  Resp: 16 19 18 20   Temp: 97.9 F (36.6 C) 98 F (36.7 C)  98.1 F (36.7 C)  TempSrc: Oral Oral  Oral  SpO2: 98%  94% 92%  Weight:      Height:        Intake/Output Summary (Last 24 hours) at 08/10/2023 0920 Last data filed at 08/10/2023 9386 Gross per 24 hour  Intake 29 ml  Output 1725 ml  Net -1696 ml   Filed Weights   08/06/23 0402 08/07/23 0559 08/09/23 0438  Weight: 80.5 kg 79.5 kg 79.7 kg   Body mass index is 25.95 kg/m.  General appearance: Awake alert.  In no distress Resp: Clear to auscultation bilaterally.  Normal effort Cardio: S1-S2 is normal regular.  No S3-S4.  No rubs murmurs or bruit GI: Abdomen is soft.  Nontender nondistended.  Bowel sounds are present normal.  No masses organomegaly Vesicular lesions in the lower back and buttock seems to be stable to slightly improved.   CBC: Recent Labs  Lab 08/05/23 0223 08/06/23 0910 08/07/23 0600 08/09/23 0248  WBC 26.7* 26.2* 17.8* 17.3*  HGB 8.5* 9.3* 8.7* 8.6*  HCT 30.4* 33.8* 31.4* 30.7*  MCV 72.7* 73.5* 74.1* 74.9*  PLT 368 386 341 322   BMP &GFR Recent Labs  Lab 08/05/23 0223 08/06/23 0910 08/07/23 0600  NA 132* 135 135  K 4.9 4.2 4.0  CL 97* 98 101  CO2 26 32 29  GLUCOSE 413* 176* 173*  BUN 41* 32* 31*  CREATININE 1.14 0.88 0.84  CALCIUM  8.6* 8.6* 8.2*   Estimated Creatinine Clearance: 77.2 mL/min (by C-G formula based on SCr of 0.84 mg/dL).  Recent Labs  Lab 08/09/23 1214 08/09/23 1622 08/09/23 1712 08/09/23 2134 08/10/23 0759  GLUCAP 154* 353* 329* 322* 120*    Radiology Studies: No results found.   Scheduled Meds:  apixaban   5 mg Oral BID   arformoterol   15 mcg Nebulization BID   atorvastatin   40 mg Oral Daily   budesonide  (PULMICORT ) nebulizer solution  0.5 mg Nebulization BID   vitamin B-12  1,000 mcg Oral Daily   escitalopram   10 mg Oral QHS   feeding supplement (GLUCERNA SHAKE)  237 mL Oral TID  BM   ferrous sulfate   325 mg Oral Q breakfast   insulin  aspart  0-15 Units Subcutaneous TID WC   insulin  aspart  0-5 Units Subcutaneous QHS   insulin  aspart  8 Units Subcutaneous TID WC   insulin  glargine-yfgn  30 Units Subcutaneous Daily   olopatadine   1 drop Both Eyes BID   predniSONE   30 mg Oral Q breakfast   QUEtiapine   12.5 mg Oral QHS   revefenacin   175 mcg Nebulization Daily   sodium chloride  flush  10-40 mL Intracatheter Q12H   sodium chloride  flush  3 mL Intravenous Q4H while awake   traZODone   225 mg Oral QHS   valACYclovir   1,000 mg Oral TID   Continuous Infusions:  sodium chloride         LOS: 15 days   Joette Pebbles, MD [facility] [email or web address] 08/10/2023, 9:20 AM

## 2023-08-10 NOTE — Plan of Care (Signed)
  Problem: Fluid Volume: Goal: Ability to maintain a balanced intake and output will improve Outcome: Progressing   Problem: Nutritional: Goal: Maintenance of adequate nutrition will improve Outcome: Progressing Goal: Progress toward achieving an optimal weight will improve Outcome: Progressing   Problem: Skin Integrity: Goal: Risk for impaired skin integrity will decrease Outcome: Progressing   Problem: Clinical Measurements: Goal: Ability to maintain clinical measurements within normal limits will improve Outcome: Progressing Goal: Respiratory complications will improve Outcome: Progressing   Problem: Activity: Goal: Risk for activity intolerance will decrease Outcome: Progressing   Problem: Education: Goal: Ability to describe self-care measures that may prevent or decrease complications (Diabetes Survival Skills Education) will improve Outcome: Not Progressing   Problem: Coping: Goal: Ability to adjust to condition or change in health will improve Outcome: Not Progressing   Problem: Health Behavior/Discharge Planning: Goal: Ability to identify and utilize available resources and services will improve Outcome: Not Progressing   Problem: Education: Goal: Knowledge of General Education information will improve Description: Including pain rating scale, medication(s)/side effects and non-pharmacologic comfort measures Outcome: Not Progressing   Problem: Metabolic: Goal: Ability to maintain appropriate glucose levels will improve Outcome: Not Met (add Reason) Note: MD notified patient needs PA shift insulin  coverage

## 2023-08-11 ENCOUNTER — Inpatient Hospital Stay (HOSPITAL_COMMUNITY)

## 2023-08-11 DIAGNOSIS — J9601 Acute respiratory failure with hypoxia: Secondary | ICD-10-CM | POA: Diagnosis not present

## 2023-08-11 DIAGNOSIS — D509 Iron deficiency anemia, unspecified: Secondary | ICD-10-CM | POA: Diagnosis not present

## 2023-08-11 LAB — GLUCOSE, CAPILLARY
Glucose-Capillary: 155 mg/dL — ABNORMAL HIGH (ref 70–99)
Glucose-Capillary: 225 mg/dL — ABNORMAL HIGH (ref 70–99)
Glucose-Capillary: 241 mg/dL — ABNORMAL HIGH (ref 70–99)
Glucose-Capillary: 273 mg/dL — ABNORMAL HIGH (ref 70–99)

## 2023-08-11 LAB — CBC
HCT: 31.4 % — ABNORMAL LOW (ref 39.0–52.0)
Hemoglobin: 8.7 g/dL — ABNORMAL LOW (ref 13.0–17.0)
MCH: 21.2 pg — ABNORMAL LOW (ref 26.0–34.0)
MCHC: 27.7 g/dL — ABNORMAL LOW (ref 30.0–36.0)
MCV: 76.4 fL — ABNORMAL LOW (ref 80.0–100.0)
Platelets: 294 K/uL (ref 150–400)
RBC: 4.11 MIL/uL — ABNORMAL LOW (ref 4.22–5.81)
RDW: 24.2 % — ABNORMAL HIGH (ref 11.5–15.5)
WBC: 16.3 K/uL — ABNORMAL HIGH (ref 4.0–10.5)
nRBC: 0 % (ref 0.0–0.2)

## 2023-08-11 LAB — BASIC METABOLIC PANEL WITH GFR
Anion gap: 9 (ref 5–15)
BUN: 24 mg/dL — ABNORMAL HIGH (ref 8–23)
CO2: 24 mmol/L (ref 22–32)
Calcium: 8.3 mg/dL — ABNORMAL LOW (ref 8.9–10.3)
Chloride: 104 mmol/L (ref 98–111)
Creatinine, Ser: 0.9 mg/dL (ref 0.61–1.24)
GFR, Estimated: >60 mL/min (ref >60)
Glucose, Bld: 245 mg/dL — ABNORMAL HIGH (ref 70–99)
Potassium: 4.4 mmol/L (ref 3.5–5.1)
Sodium: 137 mmol/L (ref 135–145)

## 2023-08-11 MED ORDER — PREDNISONE 20 MG PO TABS
20.0000 mg | ORAL_TABLET | Freq: Every day | ORAL | Status: DC
  Administered 2023-08-12 – 2023-08-13 (×2): 20 mg via ORAL
  Filled 2023-08-11 (×2): qty 1

## 2023-08-11 NOTE — Progress Notes (Signed)
 PROGRESS NOTE  Anthony [name].  FMW:[id number] DOB: 1950-07-06 DOA: 07/26/2023 PCP: No primary care provider on file.    Brief Narrative: 73 y.o. male with a PMH significant for interstitial lung disease, COPD, COVID infection, coronary artery disease, diastolic heart failure, paroxysmal atrial fibrillation, DVT, peripheral artery disease s/p left above-knee amputation, diabetes mellitus type 2 and hyperlipidemia.  Patient is a skilled nursing facility resident.  Patient was admitted with altered mentation, acute on chronic respiratory failure, COPD with exacerbation, and multilobar pneumonia.  Patient's daughter, Cena Potters, who happens to be patient's power of attorney, has confirmed that patient is DNR.     Assessment & Plan:  Acute on chronic hypoxic respiratory failure: COPD with exacerbation/ILD: Patient was requiring heated high flow oxygen at 40 L/min. Patient was placed on steroids.  Patient was given IV antibiotics. Pulmonology was subsequently consulted.  Based on their opinion it appears that patient will take a long time to recover.  Patient has completed course of antibiotics.  Remains on prednisone  40 mg daily although pulmonology does not think that this is of much value anymore.  Will plan to taper it off over the 1 week. Patient's respiratory status has improved.  He was down to 5 L of oxygen by nasal cannula yesterday but then yesterday evening it had to be increased due to hypoxia.  Seems to be stable this morning.  Continue to wean down to maintain saturations greater than 88%.  His daughter informed me yesterday that he is on home oxygen at 2 to 3 L/min. Repeat chest x-ray.  A flutter/fib with AVR New onset 7/25.  Cardiology consulted at that time. He has now converted back to regular rhythm.  Metoprolol  had to be held due to significant bradycardia.  Heart rate remains in the 50s and 60s.  Continue to hold beta-blocker. Continue Eliquis .  Continue to monitor on  telemetry.  Herpes zoster Noted to have rash in the buttock area.  Etiology initially not clear.  Very suspicious for herpes zoster.  Started on Valtrex .  Lesions are stable.  Acute on chronic HFpEF Down 12 L since admission.  Now appears euvolemic Echo did not show any change in his ejection fraction although it was a difficult acoustical windows study. Has been in significant negative fluid balance.   Furosemide  was held due to low blood pressures.  Blood pressure has improved.   Multilobar pneumonia: Repeat chest x-ray continues to show improvement. Nasal swab for MRSA came back negative.  Respiratory viral panel came back negative. Patient has completed course of cefepime .  DM2:   Monitor CBGs.  Noted to be on SSI and glargine.  As steroid is tapered down insulin  requirements should decrease.  HbA1c 6.5.  AKI: Secondary to initial illness. Continues to downtrend--> now normalized.   Leukocytosis: Probably secondary to steroids.  Remains afebrile.  Microcytic anemia/vitamin B12 deficiency Low hemoglobin noted.  No evidence of overt bleeding.  He is noted to be on anticoagulation. Anemia panel was done recently and showed ferritin of 39, iron 88, TIBC 372, percent saturation 2.  Folic acid  was 6.6.  Vitamin B12 low at 187.   He could have both B12 deficiency as well as some degree of iron deficiency. Started on B12 supplementation.  He is already on iron supplementation.   Acute encephalopathy, metabolic, cannot rule out toxic: Likely multifactorial.  Now resolved.   History of PAD: Status post left AKA.    Goals of care:  Consulted palliative care for further  discussion regarding goals of care.  He is currently DNR/DNI.  DVT prophylaxis: apixaban  (ELIQUIS ) tablet 5 mg  Code Status: DNR/DNI Family communication: No family at bedside.  Daughter updated yesterday Disposition: SNF when medically stable.  Consults called: Pulmonology, cardiology  Subjective: Patient  feels well.  No complaints offered.  Denies any worsening in his shortness of breath.  Objective: Vitals:   08/11/23 0024 08/11/23 0550 08/11/23 0748 08/11/23 0804  BP: (!) 109/45 137/79 (!) 97/56   Pulse: (!) 54 66 (!) 52 (!) 54  Resp: 20 19 18 18   Temp: 97.9 F (36.6 C) 97.9 F (36.6 C) 98.2 F (36.8 C)   TempSrc: Oral Oral Oral   SpO2: (!) 88% 90% 93% 93%  Weight:  78 kg    Height:        Intake/Output Summary (Last 24 hours) at 08/11/2023 0951 Last data filed at 08/11/2023 0900 Gross per 24 hour  Intake 1920 ml  Output 2760 ml  Net -840 ml   Filed Weights   08/07/23 0559 08/09/23 0438 08/11/23 0550  Weight: 79.5 kg 79.7 kg 78 kg   Body mass index is 25.4 kg/m.  General appearance: Awake alert.  In no distress Resp: Clear to auscultation bilaterally.  Normal effort Cardio: S1-S2 is normal regular.  No S3-S4.  No rubs murmurs or bruit GI: Abdomen is soft.  Nontender nondistended.  Bowel sounds are present normal.  No masses organomegaly Vesicular lesions in the back stable.  CBC: Recent Labs  Lab 08/05/23 0223 08/06/23 0910 08/07/23 0600 08/09/23 0248 08/11/23 0347  WBC 26.7* 26.2* 17.8* 17.3* 16.3*  HGB 8.5* 9.3* 8.7* 8.6* 8.7*  HCT 30.4* 33.8* 31.4* 30.7* 31.4*  MCV 72.7* 73.5* 74.1* 74.9* 76.4*  PLT 368 386 341 322 294   BMP &GFR Recent Labs  Lab 08/05/23 0223 08/06/23 0910 08/07/23 0600 08/11/23 0347  NA 132* 135 135 137  K 4.9 4.2 4.0 4.4  CL 97* 98 101 104  CO2 26 32 29 24  GLUCOSE 413* 176* 173* 245*  BUN 41* 32* 31* 24*  CREATININE 1.14 0.88 0.84 0.90  CALCIUM  8.6* 8.6* 8.2* 8.3*   Estimated Creatinine Clearance: 72 mL/min (by C-G formula based on SCr of 0.9 mg/dL).  Recent Labs  Lab 08/10/23 0759 08/10/23 1250 08/10/23 1715 08/10/23 2139 08/11/23 0747  GLUCAP 120* 176* 200* 102* 155*    Radiology Studies: No results found.   Scheduled Meds:  apixaban   5 mg Oral BID   arformoterol   15 mcg Nebulization BID   atorvastatin   40  mg Oral Daily   budesonide  (PULMICORT ) nebulizer solution  0.5 mg Nebulization BID   vitamin B-12  1,000 mcg Oral Daily   escitalopram   10 mg Oral QHS   feeding supplement (GLUCERNA SHAKE)  237 mL Oral TID BM   ferrous sulfate   325 mg Oral Q breakfast   insulin  aspart  0-15 Units Subcutaneous TID WC   insulin  aspart  0-5 Units Subcutaneous QHS   insulin  aspart  8 Units Subcutaneous TID WC   insulin  glargine-yfgn  30 Units Subcutaneous Daily   olopatadine   1 drop Both Eyes BID   predniSONE   30 mg Oral Q breakfast   QUEtiapine   12.5 mg Oral QHS   revefenacin   175 mcg Nebulization Daily   sodium chloride  flush  10-40 mL Intracatheter Q12H   sodium chloride  flush  3 mL Intravenous Q4H while awake   traZODone   225 mg Oral QHS   valACYclovir   1,000 mg  Oral TID   Continuous Infusions:  sodium chloride         LOS: 16 days   Joette Pebbles, MD [facility] [email or web address] 08/11/2023, 9:51 AM

## 2023-08-12 DIAGNOSIS — B029 Zoster without complications: Secondary | ICD-10-CM | POA: Diagnosis not present

## 2023-08-12 DIAGNOSIS — J9601 Acute respiratory failure with hypoxia: Secondary | ICD-10-CM | POA: Diagnosis not present

## 2023-08-12 DIAGNOSIS — D509 Iron deficiency anemia, unspecified: Secondary | ICD-10-CM | POA: Diagnosis not present

## 2023-08-12 LAB — GLUCOSE, CAPILLARY
Glucose-Capillary: 167 mg/dL — ABNORMAL HIGH (ref 70–99)
Glucose-Capillary: 208 mg/dL — ABNORMAL HIGH (ref 70–99)
Glucose-Capillary: 208 mg/dL — ABNORMAL HIGH (ref 70–99)
Glucose-Capillary: 182 mg/dL — ABNORMAL HIGH (ref 70–99)

## 2023-08-12 MED ORDER — INSULIN ASPART 100 UNIT/ML IJ SOLN
8.0000 [IU] | Freq: Three times a day (TID) | INTRAMUSCULAR | Status: DC
  Administered 2023-08-12 – 2023-08-15 (×10): 8 [IU] via SUBCUTANEOUS

## 2023-08-12 MED ORDER — FUROSEMIDE 20 MG PO TABS
20.0000 mg | ORAL_TABLET | Freq: Every day | ORAL | Status: DC
  Administered 2023-08-12 – 2023-08-15 (×4): 20 mg via ORAL
  Filled 2023-08-12 (×4): qty 1

## 2023-08-12 MED ORDER — FLUTICASONE FUROATE-VILANTEROL 200-25 MCG/ACT IN AEPB
1.0000 | INHALATION_SPRAY | Freq: Every day | RESPIRATORY_TRACT | Status: DC
  Administered 2023-08-13 – 2023-08-15 (×3): 1 via RESPIRATORY_TRACT
  Filled 2023-08-12: qty 28

## 2023-08-12 NOTE — Progress Notes (Signed)
 Physical Therapy Treatment Patient Details Name: Anthony [name]. MRN: [id number] DOB: 03/12/1950 Today's Date: 08/12/2023   History of Present Illness 73 y.o. male presents to [facility] 07/26/23 from [facility] home after sliding off the bed with AMS and hypoxia. Admitted with acute hypoxic respiratory failure, PNA, and acute encephalopathy. Pt also likely with shingles. PMHx: Cognitive deficit, ILD, COPD, CAD with hx inferior STEMI, RCA PCI, diastolic HF, paroxysmal A-fib, DVT, on anticoagulation, PAD with L AKA. diabetes type 2, hyperlipidemia.    PT Comments  Pt declined OOB activity but agreeable to exercise program. Performed circuit of 2 rounds of 4 exercises while sitting eOB. Pt on 6L of O2 with SpO2 > 93%. Recommend return to long term care facility at dc.     If plan is discharge home, recommend the following: Assistance with cooking/housework;Assist for transportation;Help with stairs or ramp for entrance;Supervision due to cognitive status;A lot of help with walking and/or transfers;A lot of help with bathing/dressing/bathroom   Can travel by private vehicle        Equipment Recommendations  None recommended by PT    Recommendations for Other Services       Precautions / Restrictions Precautions Precautions: Fall Recall of Precautions/Restrictions: Impaired Precaution/Restrictions Comments: watch O2, airborne precautions for shingles Restrictions Weight Bearing Restrictions Per Provider Order: No     Mobility  Bed Mobility Overal bed mobility: Needs Assistance Bed Mobility: Supine to Sit, Sit to Supine     Supine to sit: Supervision Sit to supine: Supervision        Transfers                   General transfer comment: Pt declined OOB    Ambulation/Gait                   Stairs             Wheelchair Mobility     Tilt Bed    Modified Rankin (Stroke Patients Only)       Balance Overall balance assessment: Needs assistance,  History of Falls Sitting-balance support: No upper extremity supported, Feet supported Sitting balance-Leahy Scale: Good                                      Communication Communication Communication: Impaired Factors Affecting Communication: Hearing impaired  Cognition Arousal: Alert Behavior During Therapy: WFL for tasks assessed/performed   PT - Cognitive impairments: History of cognitive impairments                         Following commands: Impaired Following commands impaired: Only follows one step commands consistently, Follows multi-step commands inconsistently, Follows one step commands with increased time    Cueing Cueing Techniques: Verbal cues, Tactile cues  Exercises General Exercises - Upper Extremity Shoulder Flexion: AROM, Both, 20 reps, Seated General Exercises - Lower Extremity Long Arc Quad: Strengthening, Seated, Both, 20 reps Other Exercises Other Exercises: Seated tricep presses 2 x 10 reps Other Exercises: Seated row 2 x 10 reps with pt pulling on towel with myself giving resistance.    General Comments        Pertinent Vitals/Pain Pain Assessment Pain Assessment: No/denies pain    Home Living  Prior Function            PT Goals (current goals can now be found in the care plan section) Progress towards PT goals: Progressing toward goals    Frequency           PT Plan      Co-evaluation              AM-PAC PT 6 Clicks Mobility   Outcome Measure  Help needed turning from your back to your side while in a flat bed without using bedrails?: A Little Help needed moving from lying on your back to sitting on the side of a flat bed without using bedrails?: A Little Help needed moving to and from a bed to a chair (including a wheelchair)?: A Little Help needed standing up from a chair using your arms (e.g., wheelchair or bedside chair)?: A Little Help needed to walk in  hospital room?: Total Help needed climbing 3-5 steps with a railing? : Total 6 Click Score: 14    End of Session Equipment Utilized During Treatment: Oxygen Activity Tolerance: Patient tolerated treatment well Patient left: in bed;with call bell/phone within reach;with bed alarm set   PT Visit Diagnosis: Other abnormalities of gait and mobility (R26.89);Muscle weakness (generalized) (M62.81);History of falling (Z91.81);Unsteadiness on feet (R26.81)     Time: 8857-8799 PT Time Calculation (min) (ACUTE ONLY): 18 min  Charges:    $Therapeutic Exercise: 8-22 mins PT General Charges $$ ACUTE PT VISIT: 1 Visit                     [facility] PT Acute Rehabilitation Services Office [phone]    [place] ORN [facility] 08/12/2023, 2:53 PM

## 2023-08-12 NOTE — Progress Notes (Signed)
 PROGRESS NOTE  Anthony [name].  FMW:[id number] DOB: 1950/06/07 DOA: 07/26/2023 PCP: No primary care provider on file.    Brief Narrative: 73 y.o. male with a PMH significant for interstitial lung disease, COPD, COVID infection, coronary artery disease, diastolic heart failure, paroxysmal atrial fibrillation, DVT, peripheral artery disease s/p left above-knee amputation, diabetes mellitus type 2 and hyperlipidemia.  Patient is a skilled nursing facility resident.  Patient was admitted with altered mentation, acute on chronic respiratory failure, COPD with exacerbation, and multilobar pneumonia.  Patient's daughter, Anthony [name], who happens to be patient's power of attorney, has confirmed that patient is DNR.     Assessment & Plan:  Acute on chronic hypoxic respiratory failure COPD with exacerbation/ILD: Patient was requiring heated high flow oxygen at 40 L/min. Patient was placed on steroids.  Patient was given IV antibiotics. Pulmonology was subsequently consulted.  Based on their opinion it appears that patient will take a long time to recover.  Patient has completed course of antibiotics.  Remains on prednisone   daily although pulmonology does not think that this is of much value anymore.  Plan is to taper to off by the end of this week.   Patient respiratory status has improved.  Chest x-ray yesterday suggested consolidation but from a clinical standpoint patient is stable.  He is afebrile.  Denies a cough.  Body position may have contributed to image interpretation.  Will not change management at this time.  He has previously completed course of antibiotics. Continue to wean down to maintain saturations greater than 88%.  Daughter mentioned that patient is on home oxygen at 2 to 3 L/min.  A flutter/fib with AVR New onset 7/25.  Cardiology consulted at that time. He has now converted back to regular rhythm.  Metoprolol  had to be held due to significant bradycardia.  Heart rate remains in  the 50s and 60s.  Continue to hold beta-blocker. Continue Eliquis .  Continue to monitor on telemetry.  Herpes zoster Noted to have rash in the buttock area.  Etiology initially not clear.  Very suspicious for herpes zoster.  Started on Valtrex .  Lesions are stable.  Acute on chronic HFpEF Down 12 L since admission.  Now appears euvolemic Echo did not show any change in his ejection fraction although it was a difficult acoustical windows study. Has been in significant negative fluid balance.   Furosemide  was held due to low blood pressures.  Blood pressure has improved.  Resume oral Lasix  for now.   Multilobar pneumonia: Nasal swab for MRSA came back negative.  Respiratory viral panel came back negative. Patient has completed course of cefepime . See above regarding his x-ray from 8/3.  DM2:   Monitor CBGs.  Noted to be on SSI and glargine.  As steroid is tapered down insulin  requirements should decrease.  HbA1c 6.5.  AKI: Secondary to initial illness. Continues to downtrend--> now normalized.   Leukocytosis: Probably secondary to steroids.  Remains afebrile.  Labs being checked periodically.  Will check labs tomorrow.  Microcytic anemia/vitamin B12 deficiency Low hemoglobin noted.  No evidence of overt bleeding.  He is noted to be on anticoagulation. Anemia panel was done recently and showed ferritin of 39, iron 88, TIBC 372, percent saturation 2.  Folic acid  was 6.6.  Vitamin B12 low at 187.   He could have both B12 deficiency as well as some degree of iron deficiency. Started on B12 supplementation.  He is already on iron supplementation.   Acute encephalopathy, metabolic, cannot rule out  toxic: Likely multifactorial.  Now resolved.  Occasionally distracted.   History of PAD: Status post left AKA.    Goals of care:  Consulted palliative care for further discussion regarding goals of care.  He is currently DNR/DNI.  DVT prophylaxis: apixaban  (ELIQUIS ) tablet 5 mg  Code  Status: DNR/DNI Family communication: No family at bedside.  Daughter being updated every few days. Disposition: SNF when medically stable.  Anticipate discharge when his oxygen requirements are stable at 3 to 4 L.  Consults called: Pulmonology, cardiology  Subjective: Patient feels well.  Denies any complaints.  Denies any cough.  No pain issues.  Objective: Vitals:   08/11/23 2330 08/12/23 0423 08/12/23 0726 08/12/23 0729  BP: 102/67 132/76 127/60 127/60  Pulse:  (!) 57 (!) 56 (!) 53  Resp: 19 20 16 16   Temp: 98.3 F (36.8 C) 97.7 F (36.5 C) 97.9 F (36.6 C)   TempSrc: Oral Oral Oral   SpO2:  97% 93% 95%  Weight:  79.5 kg    Height:        Intake/Output Summary (Last 24 hours) at 08/12/2023 1000 Last data filed at 08/12/2023 0629 Gross per 24 hour  Intake 1074 ml  Output 1325 ml  Net -251 ml   Filed Weights   08/09/23 0438 08/11/23 0550 08/12/23 0423  Weight: 79.7 kg 78 kg 79.5 kg   Body mass index is 25.87 kg/m.  General appearance: Awake alert.  In no distress Resp: Coarse sounds without any definite crackles or wheezing. Cardio: S1-S2 is normal regular.  No S3-S4.  No rubs murmurs or bruit GI: Abdomen is soft.  Nontender nondistended.  Bowel sounds are present normal.  No masses organomegaly Vesicular lesions in the back appear to be improving gradually.  CBC: Recent Labs  Lab 08/06/23 0910 08/07/23 0600 08/09/23 0248 08/11/23 0347  WBC 26.2* 17.8* 17.3* 16.3*  HGB 9.3* 8.7* 8.6* 8.7*  HCT 33.8* 31.4* 30.7* 31.4*  MCV 73.5* 74.1* 74.9* 76.4*  PLT 386 341 322 294   BMP &GFR Recent Labs  Lab 08/06/23 0910 08/07/23 0600 08/11/23 0347  NA 135 135 137  K 4.2 4.0 4.4  CL 98 101 104  CO2 32 29 24  GLUCOSE 176* 173* 245*  BUN 32* 31* 24*  CREATININE 0.88 0.84 0.90  CALCIUM  8.6* 8.2* 8.3*   Estimated Creatinine Clearance: 72 mL/min (by C-G formula based on SCr of 0.9 mg/dL).  Recent Labs  Lab 08/11/23 0747 08/11/23 1129 08/11/23 1635  08/11/23 2142 08/12/23 0724  GLUCAP 155* 225* 241* 273* 167*    Radiology Studies: DG CHEST PORT 1 VIEW Result Date: 08/11/2023 CLINICAL DATA:  200808 Hypoxia [id number] EXAM: PORTABLE CHEST 1 VIEW COMPARISON:  Chest x-ray 07/30/2023. FINDINGS: Patient is rotated. The heart and mediastinal contours are unchanged. Atherosclerotic plaque. Low lung volumes. Question left base and right mid lung zone focal consolidation. No pulmonary edema. No pleural effusion. No pneumothorax. No acute osseous abnormality. IMPRESSION: Low lung volumes with chronic increased interstitial markings. Question left base and right mid lung zone focal consolidation with limited evaluation due to patient rotation and hypoinflation. Recommend repeat chest x-ray PA and lateral view with improved inspiratory effort for further evaluation. Electronically Signed   By: Morgane  Naveau M.D.   On: 08/11/2023 12:15     Scheduled Meds:  apixaban   5 mg Oral BID   arformoterol   15 mcg Nebulization BID   atorvastatin   40 mg Oral Daily   budesonide  (PULMICORT ) nebulizer solution  0.5 mg  Nebulization BID   vitamin B-12  1,000 mcg Oral Daily   escitalopram   10 mg Oral QHS   feeding supplement (GLUCERNA SHAKE)  237 mL Oral TID BM   ferrous sulfate   325 mg Oral Q breakfast   insulin  aspart  0-15 Units Subcutaneous TID WC   insulin  aspart  0-5 Units Subcutaneous QHS   insulin  aspart  8 Units Subcutaneous TID WC   insulin  glargine-yfgn  30 Units Subcutaneous Daily   olopatadine   1 drop Both Eyes BID   predniSONE   20 mg Oral Q breakfast   QUEtiapine   12.5 mg Oral QHS   revefenacin   175 mcg Nebulization Daily   sodium chloride  flush  10-40 mL Intracatheter Q12H   sodium chloride  flush  3 mL Intravenous Q4H while awake   traZODone   225 mg Oral QHS   valACYclovir   1,000 mg Oral TID   Continuous Infusions:  sodium chloride         LOS: 17 days   Joette Pebbles, MD [facility] [email or web address] 08/12/2023, 10:00 AM

## 2023-08-12 NOTE — Plan of Care (Signed)
  Problem: Education: Goal: Ability to describe self-care measures that may prevent or decrease complications (Diabetes Survival Skills Education) will improve Outcome: Progressing Goal: Individualized Educational Video(s) Outcome: Progressing   Problem: Education: Goal: Ability to describe self-care measures that may prevent or decrease complications (Diabetes Survival Skills Education) will improve Outcome: Progressing Goal: Individualized Educational Video(s) Outcome: Progressing   Problem: Health Behavior/Discharge Planning: Goal: Ability to identify and utilize available resources and services will improve Outcome: Progressing Goal: Ability to manage health-related needs will improve Outcome: Progressing   Problem: Metabolic: Goal: Ability to maintain appropriate glucose levels will improve Outcome: Progressing   Problem: Skin Integrity: Goal: Risk for impaired skin integrity will decrease Outcome: Progressing   Problem: Skin Integrity: Goal: Risk for impaired skin integrity will decrease Outcome: Progressing

## 2023-08-12 NOTE — TOC Progression Note (Addendum)
 Transition of Care (TOC) - Progression Note    Patient Details  Name: Anthony [name]. MRN: [id number] Date of Birth: 1950-07-11  Transition of [facility]) CM/SW Contact  Isaiah Public, LCSWA Phone Number: 08/12/2023, 12:48 PM  Clinical Narrative:     Per chart review at 12:48pm patient currently on 7 liters HFNC.[facility] informed CSW Facility not able to accommodate oxygen levels greater than 4L nasal cannula. CSW will continue to follow and assist with patients dc planning needs.  Expected Discharge Plan: Long Term Nursing Home Barriers to Discharge: Continued Medical Work up               Expected Discharge Plan and Services In-house Referral: Clinical Social Work   Post Acute Care Choice: Skilled Nursing Facility Living arrangements for the past 2 months: Skilled Nursing Facility                                       Social Drivers of Health (SDOH) Interventions SDOH Screenings   Food Insecurity: Patient Unable To Answer (07/26/2023)  Housing: Patient Unable To Answer (07/26/2023)  Transportation Needs: Patient Unable To Answer (07/26/2023)  Utilities: Patient Unable To Answer (07/26/2023)  Depression (PHQ2-9): Medium Risk (11/27/2019)  Financial Resource Strain: Low Risk  (09/15/2020)   Received from [facility]  Social Connections: Patient Unable To Answer (07/26/2023)  Stress: No Stress Concern Present (09/15/2020)   Received from [facility]  Tobacco Use: Medium Risk (07/26/2023)    Readmission Risk Interventions    02/13/2021    3:47 PM  Readmission Risk Prevention Plan  Transportation Screening Complete  PCP or Specialist Appt within 5-7 Days Complete  Home Care Screening Complete  Medication Review (RN CM) Complete

## 2023-08-12 NOTE — Plan of Care (Signed)
  Problem: Education: Goal: Ability to describe self-care measures that may prevent or decrease complications (Diabetes Survival Skills Education) will improve Outcome: Progressing Goal: Individualized Educational Video(s) Outcome: Progressing   Problem: Coping: Goal: Ability to adjust to condition or change in health will improve Outcome: Progressing   Problem: Fluid Volume: Goal: Ability to maintain a balanced intake and output will improve Outcome: Progressing   Problem: Health Behavior/Discharge Planning: Goal: Ability to identify and utilize available resources and services will improve Outcome: Progressing Goal: Ability to manage health-related needs will improve Outcome: Progressing   Problem: Metabolic: Goal: Ability to maintain appropriate glucose levels will improve Outcome: Progressing   Problem: Nutritional: Goal: Maintenance of adequate nutrition will improve Outcome: Progressing Goal: Progress toward achieving an optimal weight will improve Outcome: Progressing   Problem: Skin Integrity: Goal: Risk for impaired skin integrity will decrease Outcome: Progressing   Problem: Tissue Perfusion: Goal: Adequacy of tissue perfusion will improve Outcome: Progressing   Problem: Education: Goal: Knowledge of General Education information will improve Description: Including pain rating scale, medication(s)/side effects and non-pharmacologic comfort measures Outcome: Progressing   Problem: Health Behavior/Discharge Planning: Goal: Ability to manage health-related needs will improve Outcome: Progressing   Problem: Clinical Measurements: Goal: Ability to maintain clinical measurements within normal limits will improve Outcome: Progressing Goal: Will remain free from infection Outcome: Progressing Goal: Diagnostic test results will improve Outcome: Progressing Goal: Respiratory complications will improve Outcome: Progressing Goal: Cardiovascular complication will  be avoided Outcome: Progressing   Problem: Activity: Goal: Risk for activity intolerance will decrease Outcome: Progressing   Problem: Nutrition: Goal: Adequate nutrition will be maintained Outcome: Progressing   Problem: Coping: Goal: Level of anxiety will decrease Outcome: Progressing   Problem: Elimination: Goal: Will not experience complications related to bowel motility Outcome: Progressing Goal: Will not experience complications related to urinary retention Outcome: Progressing   Problem: Pain Managment: Goal: General experience of comfort will improve and/or be controlled Outcome: Progressing   Problem: Safety: Goal: Ability to remain free from injury will improve Outcome: Progressing   Problem: Skin Integrity: Goal: Risk for impaired skin integrity will decrease Outcome: Progressing   Problem: Education: Goal: Knowledge of disease or condition will improve Outcome: Progressing Goal: Knowledge of the prescribed therapeutic regimen will improve Outcome: Progressing Goal: Individualized Educational Video(s) Outcome: Progressing

## 2023-08-13 DIAGNOSIS — J9601 Acute respiratory failure with hypoxia: Secondary | ICD-10-CM | POA: Diagnosis not present

## 2023-08-13 DIAGNOSIS — D509 Iron deficiency anemia, unspecified: Secondary | ICD-10-CM | POA: Diagnosis not present

## 2023-08-13 DIAGNOSIS — B029 Zoster without complications: Secondary | ICD-10-CM | POA: Diagnosis not present

## 2023-08-13 LAB — CBC
HCT: 32.1 % — ABNORMAL LOW (ref 39.0–52.0)
Hemoglobin: 9.1 g/dL — ABNORMAL LOW (ref 13.0–17.0)
MCH: 21.8 pg — ABNORMAL LOW (ref 26.0–34.0)
MCHC: 28.3 g/dL — ABNORMAL LOW (ref 30.0–36.0)
MCV: 76.8 fL — ABNORMAL LOW (ref 80.0–100.0)
Platelets: 262 K/uL (ref 150–400)
RBC: 4.18 MIL/uL — ABNORMAL LOW (ref 4.22–5.81)
RDW: 25.6 % — ABNORMAL HIGH (ref 11.5–15.5)
WBC: 13.6 K/uL — ABNORMAL HIGH (ref 4.0–10.5)
nRBC: 0 % (ref 0.0–0.2)

## 2023-08-13 LAB — GLUCOSE, CAPILLARY
Glucose-Capillary: 181 mg/dL — ABNORMAL HIGH (ref 70–99)
Glucose-Capillary: 157 mg/dL — ABNORMAL HIGH (ref 70–99)
Glucose-Capillary: 176 mg/dL — ABNORMAL HIGH (ref 70–99)
Glucose-Capillary: 241 mg/dL — ABNORMAL HIGH (ref 70–99)

## 2023-08-13 LAB — BASIC METABOLIC PANEL WITH GFR
Anion gap: 6 (ref 5–15)
BUN: 24 mg/dL — ABNORMAL HIGH (ref 8–23)
CO2: 29 mmol/L (ref 22–32)
Calcium: 8.5 mg/dL — ABNORMAL LOW (ref 8.9–10.3)
Chloride: 103 mmol/L (ref 98–111)
Creatinine, Ser: 0.96 mg/dL (ref 0.61–1.24)
GFR, Estimated: >60 mL/min (ref >60)
Glucose, Bld: 168 mg/dL — ABNORMAL HIGH (ref 70–99)
Potassium: 3.8 mmol/L (ref 3.5–5.1)
Sodium: 138 mmol/L (ref 135–145)

## 2023-08-13 MED ORDER — PREDNISONE 20 MG PO TABS
20.0000 mg | ORAL_TABLET | Freq: Every day | ORAL | Status: DC
  Administered 2023-08-14: 20 mg via ORAL
  Filled 2023-08-13 (×2): qty 1

## 2023-08-13 NOTE — TOC Progression Note (Signed)
 Transition of Care (TOC) - Progression Note    Patient Details  Name: Anthony [name]. MRN: [id number] Date of Birth: [birth year]  Transition of Care [facility]) CM/SW Contact  Isaiah Public, LCSWA Phone Number: 08/13/2023, 11:03 AM  Clinical Narrative:     Plan for patient to return to [facility] when medically stable. CSW will continue to follow and assist with patients dc planning needs.  Expected Discharge Plan: Long Term Nursing Home Barriers to Discharge: Continued Medical Work up               Expected Discharge Plan and Services In-house Referral: Clinical Social Work   Post Acute Care Choice: Skilled Nursing Facility Living arrangements for the past 2 months: Skilled Nursing Facility                                       Social Drivers of Health (SDOH) Interventions SDOH Screenings   Food Insecurity: Patient Unable To Answer (07/26/2023)  Housing: Patient Unable To Answer (07/26/2023)  Transportation Needs: Patient Unable To Answer (07/26/2023)  Utilities: Patient Unable To Answer (07/26/2023)  Depression (PHQ2-9): Medium Risk (11/27/2019)  Financial Resource Strain: Low Risk  (09/15/2020)   [facility]  Social Connections: Patient Unable To Answer (07/26/2023)  Stress: No Stress Concern Present (09/15/2020)   Received from [facility]  Tobacco Use: Medium Risk (07/26/2023)    Readmission Risk Interventions    02/13/2021    3:47 PM  Readmission Risk Prevention Plan  Transportation Screening Complete  PCP or Specialist Appt within 5-7 Days Complete  Home Care Screening Complete  Medication Review (RN CM) Complete

## 2023-08-13 NOTE — Plan of Care (Signed)

## 2023-08-13 NOTE — Progress Notes (Addendum)
 PROGRESS NOTE  Anthony [name].  FMW:[id number] DOB: 26-Sep-1950 DOA: 07/26/2023 PCP: No primary care provider on file.    Brief Narrative: 73 y.o. male with a PMH significant for interstitial lung disease, COPD, COVID infection, coronary artery disease, diastolic heart failure, paroxysmal atrial fibrillation, DVT, peripheral artery disease s/p left above-knee amputation, diabetes mellitus type 2 and hyperlipidemia.  Patient is a skilled nursing facility resident.  Patient was admitted with altered mentation, acute on chronic respiratory failure, COPD with exacerbation, and multilobar pneumonia.  Patient's daughter, Cena Potters, who happens to be patient's power of attorney, has confirmed that patient is DNR.     Assessment & Plan:  Acute on chronic hypoxic respiratory failure COPD with exacerbation/ILD: Patient was requiring heated high flow oxygen at 40 L/min. Patient was placed on steroids.  Patient was given IV antibiotics. Pulmonology was subsequently consulted.  Based on their opinion it appears that patient will take a long time to recover.  Patient has completed course of antibiotics.  Remains on prednisone   daily although pulmonology does not think that this is of much value anymore.  Plan is to taper to off by the end of this week.   Patient respiratory status has improved.  Chest x-ray yesterday suggested consolidation but from a clinical standpoint patient is stable.  He is afebrile.  Denies a cough.  Body position may have contributed to image interpretation.  Will not change management at this time.  He has previously completed course of antibiotics. Continue to encourage incentive spirometry.  Wean down oxygen to maintain sats greater than 88%.  He was on 2 to 3 L of oxygen at home at baseline. Respiratory status is stable. Prednisone  will be tapered off this week.  A flutter/fib with AVR New onset 7/25.  Cardiology consulted at that time. He has now converted back to regular  rhythm.  Metoprolol  had to be held due to significant bradycardia.  Heart rate remains in the 50s and 60s.  Continue to hold beta-blocker. Continue Eliquis .  Continue to monitor on telemetry.  Herpes zoster Noted to have rash in the buttock area.  Etiology initially not clear.  Very suspicious for herpes zoster.  Started on Valtrex .  Very slow to improve possibly because he is on steroids which will be tapered off this week.  Acute on chronic HFpEF Down 12 L since admission.  Now appears euvolemic Echo did not show any change in his ejection fraction although it was a difficult acoustical windows study. Has been in significant negative fluid balance.   Furosemide  was held due to low blood pressures.  Blood pressure has improved.  Furosemide  has been resumed.   Multilobar pneumonia: Nasal swab for MRSA came back negative.  Respiratory viral panel came back negative. Patient has completed course of cefepime . See above regarding his x-ray from 8/3.  DM2:   Monitor CBGs.  Noted to be on SSI and glargine.  As steroid is tapered down insulin  requirements should decrease.  HbA1c 6.5.  AKI: Secondary to initial illness. Continues to downtrend--> now normalized.   Leukocytosis: Probably secondary to steroids.  WBC is improving.  Microcytic anemia/vitamin B12 deficiency Low hemoglobin noted.  No evidence of overt bleeding.  He is noted to be on anticoagulation. Anemia panel was done recently and showed ferritin of 39, iron 88, TIBC 372, percent saturation 2.  Folic acid  was 6.6.  Vitamin B12 low at 187.   He could have both B12 deficiency as well as some degree of iron  deficiency. Started on B12 supplementation.  He is already on iron supplementation.   Acute encephalopathy, metabolic, cannot rule out toxic: Likely multifactorial.  Now resolved.  Occasionally distracted.   History of PAD: Status post left AKA.    Goals of care:  Consulted palliative care for further discussion  regarding goals of care.  He is currently DNR/DNI.  DVT prophylaxis: apixaban  (ELIQUIS ) tablet 5 mg  Code Status: DNR/DNI Family communication: No family at bedside.  Daughter being updated every few days. Disposition: SNF when medically stable.  Anticipate discharge when his oxygen requirements are stable at 3 to 4 L.  Apparently has SNF cannot take him on more than 4 L of oxygen by nasal cannula.  Consults called: Pulmonology, cardiology  Subjective: No complaints offered.  Objective: Vitals:   08/13/23 0005 08/13/23 0418 08/13/23 0734 08/13/23 0835  BP: (!) 145/78 (!) 134/59  (!) 127/57  Pulse:  61 (!) 57   Resp: 17  16 14   Temp: 97.6 F (36.4 C) 97.9 F (36.6 C)    TempSrc: Oral Axillary  Oral  SpO2: 90% 91% 93%   Weight:      Height:        Intake/Output Summary (Last 24 hours) at 08/13/2023 1015 Last data filed at 08/13/2023 0502 Gross per 24 hour  Intake 240 ml  Output 1600 ml  Net -1360 ml   Filed Weights   08/09/23 0438 08/11/23 0550 08/12/23 0423  Weight: 79.7 kg 78 kg 79.5 kg   Body mass index is 25.87 kg/m.  General appearance: Awake alert.  In no distress Resp: Clear to auscultation bilaterally.  Normal effort Cardio: S1-S2 is normal regular.  No S3-S4.  No rubs murmurs or bruit GI: Abdomen is soft.  Nontender nondistended.  Bowel sounds are present normal.  No masses organomegaly Vesicular lesions in the lower back are stable  CBC: Recent Labs  Lab 08/07/23 0600 08/09/23 0248 08/11/23 0347 08/13/23 0547  WBC 17.8* 17.3* 16.3* 13.6*  HGB 8.7* 8.6* 8.7* 9.1*  HCT 31.4* 30.7* 31.4* 32.1*  MCV 74.1* 74.9* 76.4* 76.8*  PLT 341 322 294 262   BMP &GFR Recent Labs  Lab 08/07/23 0600 08/11/23 0347 08/13/23 0547  NA 135 137 138  K 4.0 4.4 3.8  CL 101 104 103  CO2 29 24 29   GLUCOSE 173* 245* 168*  BUN 31* 24* 24*  CREATININE 0.84 0.90 0.96  CALCIUM  8.2* 8.3* 8.5*   Estimated Creatinine Clearance: 67.5 mL/min (by C-G formula based on SCr of  0.96 mg/dL).  Recent Labs  Lab 08/12/23 0724 08/12/23 1203 08/12/23 1610 08/12/23 2051 08/13/23 0841  GLUCAP 167* 208* 208* 182* 181*    Radiology Studies: No results found.    Scheduled Meds:  apixaban   5 mg Oral BID   atorvastatin   40 mg Oral Daily   vitamin B-12  1,000 mcg Oral Daily   escitalopram   10 mg Oral QHS   feeding supplement (GLUCERNA SHAKE)  237 mL Oral TID BM   ferrous sulfate   325 mg Oral Q breakfast   fluticasone  furoate-vilanterol  1 puff Inhalation Daily   furosemide   20 mg Oral Daily   insulin  aspart  0-15 Units Subcutaneous TID WC   insulin  aspart  0-5 Units Subcutaneous QHS   insulin  aspart  8 Units Subcutaneous TID WC   insulin  glargine-yfgn  30 Units Subcutaneous Daily   olopatadine   1 drop Both Eyes BID   predniSONE   20 mg Oral Q breakfast   QUEtiapine   12.5 mg Oral QHS   revefenacin   175 mcg Nebulization Daily   sodium chloride  flush  10-40 mL Intracatheter Q12H   traZODone   225 mg Oral QHS   valACYclovir   1,000 mg Oral TID   Continuous Infusions:  sodium chloride         LOS: 18 days   Joette Pebbles, MD [facility] [email or web address] 08/13/2023, 10:15 AM

## 2023-08-13 NOTE — Progress Notes (Deleted)
  Cardiology Office Note:  .   Date:  08/13/2023  ID:  Anthony [name]., DOB [birth year], MRN [id number] PCP: No primary care provider on file.  [facility] Providers Cardiologist:  Lonni Cash, MD { Click to update primary MD,subspecialty MD or APP then REFRESH:1}   History of Present Illness: .   Anthony [name]. is a 73 y.o. male   with CAD, COPD, PAD, HFpEF, persistent atrial fibrillation, DVT June 2012 with an inferior ST elevation myocardial infarction. Cardiac catheterization demonstrated a totally occluded mid RCA. This was treated with a Vision bare metal stent. He had moderate non-obstructive disease in the LAD. Left ventriculogram demonstrated inferior hypokinesis with an EF of 50%. He had left above the knee amputation in September 2022 secondary to lower leg ischemia. He had atrial fib/flutter while admitted for vascular procedures. He was admitted to [facility] in February 2023 with hypoxic respiratory failure in the setting of Covid infection and had atrial fib with RVR.   admitted on 07/26/2023 for acute on chronic COPD exacerbation. We saw him for afib/flutter who converted to NSR also diastolic CHF. Diuresed 11L  ROS: ***  Studies Reviewed: SABRA         Prior CV Studies: {Select studies to display:26339}   TTE 07/26/2023  1. Difficult acoustic windows Overall LV function is normal to mildly  decreased Would recomm limited imaging with Definity to evaluate regional  wall motion and overall LV function. . The left ventricle has no regional  wall motion abnormalities. Left  ventricular diastolic parameters were normal.   2. Right ventricular systolic function reduced. The right ventricular  size is normal.   3. The mitral valve is normal in structure. Trivial mitral valve  regurgitation.   4. The aortic valve is tricuspid. Aortic valve regurgitation is not  visualized. Aortic valve sclerosis/calcification is present, without any  evidence of aortic stenosis.    5. The inferior vena cava is dilated in size with <50% respiratory  variability, suggesting right atrial pressure of 15 mmHg.     Risk Assessment/Calculations:   {Does this patient have ATRIAL FIBRILLATION?:[phone]}         Physical Exam:   VS:  There were no vitals taken for this visit.   Orhtostatics: No data found. Wt Readings from Last 3 Encounters:  08/12/23 175 lb 3.2 oz (79.5 kg)  12/13/21 203 lb (92.1 kg)  06/20/21 175 lb (79.4 kg)    GEN: Well nourished, well developed in no acute distress NECK: No JVD; No carotid bruits CARDIAC: ***RRR, no murmurs, rubs, gallops RESPIRATORY:  Clear to auscultation without rales, wheezing or rhonchi  ABDOMEN: Soft, non-tender, non-distended EXTREMITIES:  No edema; No deformity   ASSESSMENT AND PLAN: .    PAF on eliquis  and BB  Chronic diastolic CHF  CAD STEMI 2012 treated with BMS RCA  PAD  COPD     {Are you ordering a CV Procedure (e.g. stress test, cath, DCCV, TEE, etc)?   Press F2        :[id number]}  Dispo: ***  Signed, Olivia Pavy, PA-C

## 2023-08-14 DIAGNOSIS — I159 Secondary hypertension, unspecified: Secondary | ICD-10-CM

## 2023-08-14 DIAGNOSIS — B029 Zoster without complications: Secondary | ICD-10-CM

## 2023-08-14 DIAGNOSIS — N179 Acute kidney failure, unspecified: Secondary | ICD-10-CM

## 2023-08-14 DIAGNOSIS — G9341 Metabolic encephalopathy: Secondary | ICD-10-CM

## 2023-08-14 DIAGNOSIS — J410 Simple chronic bronchitis: Secondary | ICD-10-CM

## 2023-08-14 DIAGNOSIS — I480 Paroxysmal atrial fibrillation: Secondary | ICD-10-CM | POA: Diagnosis not present

## 2023-08-14 DIAGNOSIS — I5033 Acute on chronic diastolic (congestive) heart failure: Secondary | ICD-10-CM | POA: Diagnosis not present

## 2023-08-14 DIAGNOSIS — I2510 Atherosclerotic heart disease of native coronary artery without angina pectoris: Secondary | ICD-10-CM | POA: Diagnosis not present

## 2023-08-14 DIAGNOSIS — Z86718 Personal history of other venous thrombosis and embolism: Secondary | ICD-10-CM

## 2023-08-14 DIAGNOSIS — E1169 Type 2 diabetes mellitus with other specified complication: Secondary | ICD-10-CM

## 2023-08-14 LAB — GLUCOSE, CAPILLARY
Glucose-Capillary: 225 mg/dL — ABNORMAL HIGH (ref 70–99)
Glucose-Capillary: 202 mg/dL — ABNORMAL HIGH (ref 70–99)
Glucose-Capillary: 140 mg/dL — ABNORMAL HIGH (ref 70–99)
Glucose-Capillary: 146 mg/dL — ABNORMAL HIGH (ref 70–99)

## 2023-08-14 NOTE — Assessment & Plan Note (Signed)
 Echocardiogram with preserved LV systolic function, RV with reduced systolic function, no significant valvular disease. (Poor acoustic windows)   Patient with negative fluid balance.  Continue diuresis with furosemide .  Limited therapy due to poor prognosis.

## 2023-08-14 NOTE — Progress Notes (Signed)
 Occupational Therapy Treatment Patient Details Name: Anthony [name]. MRN: [id number] DOB: 07-Jan-1951 Today's Date: 08/14/2023   History of present illness 73 y.o. male presents to [facility] 07/26/23 from [place] nursing home after sliding off the bed with AMS and hypoxia. Admitted with acute hypoxic respiratory failure, PNA, and acute encephalopathy. Pt also likely with shingles. PMHx: Cognitive deficit, ILD, COPD, CAD with hx inferior STEMI, RCA PCI, diastolic HF, paroxysmal A-fib, DVT, on anticoagulation, PAD with L AKA. diabetes type 2, hyperlipidemia.   OT comments  Pt reports not feeling as well today but agreeable to try sitting EOB. Pt sat EOB with Sup with use of rail, increased time. Pt with excessive coughing which limited activity. Pt tolerated sitting EOB with CGA for grooming/hygiene and UB ADL tasks, UE ROM exercises. Pt declined sit-stand/SPTs today and requesting to return to supine. Pt on 4L O2 with O2 SATs dropping to mid 80s during seated activity (with excessive coughing) with instruction for deep, pursed lip breathing to recover >88%, HR mid 80s to 90s. OT will continue to follow acutely to maximize level of function and safety      If plan is discharge home, recommend the following:  A lot of help with bathing/dressing/bathroom;Assistance with cooking/housework;Direct supervision/assist for financial management;Supervision due to cognitive status;Assist for transportation;Help with stairs or ramp for entrance;Direct supervision/assist for medications management;A little help with walking and/or transfers   Equipment Recommendations  Other (comment) (defer)    Recommendations for Other Services      Precautions / Restrictions Precautions Precautions: Fall Recall of Precautions/Restrictions: Impaired Precaution/Restrictions Comments: watch O2, airborne precautions for shingles Restrictions Weight Bearing Restrictions Per Provider Order: No       Mobility Bed  Mobility Overal bed mobility: Needs Assistance Bed Mobility: Supine to Sit, Sit to Supine     Supine to sit: Supervision Sit to supine: Supervision   General bed mobility comments: no c/o dizziness,  use of the rails    Transfers                   General transfer comment: Pt declined OOB     Balance Overall balance assessment: Needs assistance, History of Falls Sitting-balance support: No upper extremity supported, Feet supported Sitting balance-Leahy Scale: Fair Sitting balance - Comments: significant coughing while in bed upon arrival as well a sitting EOB, tolerated EOB sitting x 12 minutes for grooming/hygiene, UB bathing and dressing tasks CGA                                   ADL either performed or assessed with clinical judgement   ADL Overall ADL's : Needs assistance/impaired     Grooming: Wash/dry hands;Wash/dry face;Sitting;Contact guard assist   Upper Body Bathing: Contact guard assist;Sitting       Upper Body Dressing : Contact guard assist;Sitting                     General ADL Comments: pt declined SPTs to commode    Extremity/Trunk Assessment Upper Extremity Assessment Upper Extremity Assessment: Generalized weakness   Lower Extremity Assessment Lower Extremity Assessment: Defer to PT evaluation LLE Deficits / Details: hx of L AKA   Cervical / Trunk Assessment Cervical / Trunk Assessment: Normal    Vision Ability to See in Adequate Light: 0 Adequate Patient Visual Report: No change from baseline     Perception     Praxis  Communication Communication Communication: Impaired Factors Affecting Communication: Hearing impaired   Cognition Arousal: Alert Behavior During Therapy: WFL for tasks assessed/performed Cognition: History of cognitive impairments                               Following commands: Impaired Following commands impaired: Only follows one step commands consistently,  Follows multi-step commands inconsistently, Follows one step commands with increased time      Cueing   Cueing Techniques: Verbal cues, Tactile cues  Exercises      Shoulder Instructions       General Comments      Pertinent Vitals/ Pain       Pain Assessment Pain Assessment: No/denies pain Faces Pain Scale: No hurt Pain Intervention(s): Monitored during session, Repositioned  Home Living                                          Prior Functioning/Environment              Frequency  Min 2X/week        Progress Toward Goals  OT Goals(current goals can now be found in the care plan section)  Progress towards OT goals: OT to reassess next treatment     Plan      Co-evaluation                 AM-PAC OT 6 Clicks Daily Activity     Outcome Measure   Help from another person eating meals?: None Help from another person taking care of personal grooming?: A Little Help from another person toileting, which includes using toliet, bedpan, or urinal?: A Lot Help from another person bathing (including washing, rinsing, drying)?: A Lot Help from another person to put on and taking off regular upper body clothing?: A Little Help from another person to put on and taking off regular lower body clothing?: A Lot 6 Click Score: 16    End of Session    OT Visit Diagnosis: Unsteadiness on feet (R26.81);Other abnormalities of gait and mobility (R26.89);Other symptoms and signs involving cognitive function;Muscle weakness (generalized) (M62.81)   Activity Tolerance Patient limited by fatigue;Other (comment) (excessive coughing)   Patient Left in chair;with call bell/phone within reach;with chair alarm set   Nurse Communication Mobility status;Other (comment) (excessive coughing)        Time: 8688-8668 OT Time Calculation (min): 20 min  Charges: OT General Charges $OT Visit: 1 Visit OT Treatments $Self Care/Home Management : 8-22  mins    Jacques Karna Loose 08/14/2023, 1:48 PM

## 2023-08-14 NOTE — Assessment & Plan Note (Signed)
 Patient was placed on insulin  therapy, sliding scale and basal for glucose control.  He had episodic hyperglycemia, likely steroids induced. Continue glucose monitoring. At discharge patient will resume semaglutide , metformin  and glipizide .   Continue statin therapy.

## 2023-08-14 NOTE — Progress Notes (Signed)
  Progress Note   Patient: Anthony [name]. FMW:[id number] DOB: 19-Oct-1950 DOA: 07/26/2023     19 DOS: the patient was seen and examined on 08/14/2023   Brief hospital course: 73 y.o. male with a PMH significant for interstitial lung disease, COPD, COVID infection, coronary artery disease, diastolic heart failure, paroxysmal atrial fibrillation, DVT, peripheral artery disease s/p left above-knee amputation, diabetes mellitus type 2 and hyperlipidemia.  Patient is a skilled nursing facility resident.  Patient was admitted with altered mentation, acute on chronic respiratory failure, COPD with exacerbation, and multilobar pneumonia.  Patient's daughter, Anthony [name], who happens to be patient's power of attorney, has confirmed that patient is DNR.    Assessment and Plan: * COPD (chronic obstructive pulmonary disease) (HCC) Exacerbation of COPD and ILD.  Multilobar pneumonia. (Present on admission)   Patient has completed course of antibiotic therapy.  Plan to continue slow taper of steroids with prednisone .  Continue airway clearing techniques as tolerated, patient very weak and deconditioned.  Plan to continue supportive supplemental 02 per [place].  Today on 4 L/min with good toleration at rest.   Acute on chronic diastolic CHF (congestive heart failure) (HCC) Echocardiogram with preserved LV systolic function, RV with reduced systolic function, no significant valvular disease. (Poor acoustic windows)   Patient with negative fluid balance.  Continue diuresis with furosemide .  Limited therapy due to poor prognosis.     Paroxysmal atrial fibrillation (HCC) Patient has converted to sinus rhythm.  Plan to continue anticoagulation with apixaban , no AV blocker due to risk of bradycardia.   CAD (coronary artery disease) No acute coronary syndrome.   HTN (hypertension) Continue blood pressure monitoring  Hyperlipidemia associated with type 2 diabetes mellitus (HCC) Continue glucose cover and  monitoring with insulin  sliding scale.    History of DVT (deep vein thrombosis) Continue anticoagulation with apixaban   Zoster Some lesions are crusted, no blister,  Pain is controlled.   AKI (acute kidney injury) (HCC) Resolved.   Acute metabolic encephalopathy Multifactorial, now resolved.  Plan to continue supportive care, transfer to SNF         Subjective: patient very weak and deconditioned, no chest pain, no nausea or vomiting, denies any pain, and per nursing he has been tolerating po well.   Physical Exam: Vitals:   08/13/23 2357 08/14/23 0618 08/14/23 0807 08/14/23 0838  BP: (!) 141/61 126/60    Pulse: (!) 101 (!) 55 67   Resp: 20  18 (!) 21  Temp: 98.7 F (37.1 C) 97.9 F (36.6 C)  98.8 F (37.1 C)  TempSrc: Oral Oral  Oral  SpO2: 91% 92% 94%   Weight:  80.8 kg    Height:       Neurology awake and alert, deconditioned ENT With mild pallor with no icterus Cardiovascular with S1 and S2 present and regular with no gallops, rubs or murmurs Respiratory with bilateral rales with no wheezing or rhonchi, no accessory muscle use Abdomen with no distention, soft and non tender No lower extremity edema  Data Reviewed:    Family Communication: no family at the bedside   Disposition: Status is: Inpatient Remains inpatient appropriate because: pending transfer to SNF   Planned Discharge Destination: Skilled nursing facility    Author: Elidia Toribio Furnace, MD 08/14/2023 9:07 AM  For on call review [email or web address].

## 2023-08-14 NOTE — Assessment & Plan Note (Signed)
 Continue blood pressure monitoring  Continue blood pressure control with losartan .

## 2023-08-14 NOTE — Hospital Course (Signed)
 Anthony [name] was admitted to the hospital with the working diagnosis of acute on chronic hypoxemic respiratory failure.   73 y.o. male with a past medical history significant for interstitial lung disease, COPD, prior COVID infection, coronary artery disease, diastolic heart failure, paroxysmal atrial fibrillation, DVT, peripheral artery disease s/p left above-knee amputation, diabetes mellitus type 2 and hyperlipidemia.  Patient is a skilled nursing facility resident.  Patient was brought to the hospital after a ground level fall. EMS was called, he was found hypoxemic, with 02 saturation 70%, he was placed on non rebrether mask and was transported to the ED.  On his initial physical examination patient was confused and not able to give detailed history. His blood pressure was 105/65, HR 68, RR 24, 02 saturation 93% on supplemental 02 per non re-breather mask. Lungs with wheezing and coarse breath sounds bilaterally, heart with S1 and S2 present and regular, abdomen with no distention, left AKA and trace right lower extremity edema.   ABG 7,36/ 39.6/ 57/ 24/ 89%  Na 136, K 4,8 Cl 102 bicarbonate 21 glucose 175 bun 24 cr 2.21  BNP 453  High sensitive troponin 58 and 59  Wbc 21,8 hgb 9,3 plt 299  Sars covid 19 negative Influenza negative  RSV negative  Respiratory viral panel negative   Urine analysis SG 1,014, protein 30, glucose 50, negative leukocytes and negative hgb.  Streptococcal pneumonia urinary antigen negative  Legionella pneumonia urinary antigen negative   CT head no acute intracranial abnormalities, chronic atrophic and ischemic changes.  CT cervical spine with multilevel degenerative change without acute abnormalities.   Chest radiograph with hypoinflation, cardiomegaly, bilateral lower zones increased lung markings.   CT chest with advanced emphysema (centrilobular and paraseptal), bilateral lower lobe and right middle lobe consolidation. No pleural effusion. Signs of honey  combing at the right lower lobe per my interpretation. Interlobular septal thickening.  Numerous chronic bilateral rib fractures, chronic right clavicle fracture.   EKG 83 bpm, normal axis, normal intervals, qtc 445, sinus rhythm with left atrial enlargement, positive PVC, no significant ST segment or T wave changes.   Patient was placed on antibiotic therapy, bronchodilator and supplemental 02.  Systemic corticosteroids.  Diuresis with furosemide .   His oxygenation has improved, he has been able to maintain oxygenation with 4 L/min per [place] with good toleration.  Plan to transfer to SNF to continue physical therapy.

## 2023-08-14 NOTE — TOC Progression Note (Signed)
 Transition of Care (TOC) - Progression Note    Patient Details  Name: Anthony [name]. MRN: [id number] Date of Birth: 18-Apr-1950  Transition of Care [facility]) CM/SW Contact  Isaiah Public, LCSWA Phone Number: 08/14/2023, 10:38 AM  Clinical Narrative:     CSW spoke with star with [facility] who informed CSW that facility can accept patient back on 4 liters of nasal cannula but as to be stable on it for 24 to 48 hours before returning back to facility. CSW will continue to follow and assist with patients dc planning needs.  Expected Discharge Plan: Long Term Nursing Home Barriers to Discharge: Continued Medical Work up               Expected Discharge Plan and Services In-house Referral: Clinical Social Work   Post Acute Care Choice: Skilled Nursing Facility Living arrangements for the past 2 months: Skilled Nursing Facility                                       Social Drivers of Health (SDOH) Interventions SDOH Screenings   Food Insecurity: Patient Unable To Answer (07/26/2023)  Housing: Patient Unable To Answer (07/26/2023)  Transportation Needs: Patient Unable To Answer (07/26/2023)  Utilities: Patient Unable To Answer (07/26/2023)  Depression (PHQ2-9): Medium Risk (11/27/2019)  Financial Resource Strain: Low Risk  (09/15/2020)   Received from [facility]  Social Connections: Patient Unable To Answer (07/26/2023)  Stress: No Stress Concern Present (09/15/2020)   Received from [facility]  Tobacco Use: Medium Risk (07/26/2023)    Readmission Risk Interventions    02/13/2021    3:47 PM  Readmission Risk Prevention Plan  Transportation Screening Complete  PCP or Specialist Appt within 5-7 Days Complete  Home Care Screening Complete  Medication Review (RN CM) Complete

## 2023-08-14 NOTE — Assessment & Plan Note (Signed)
 No acute coronary syndrome.

## 2023-08-14 NOTE — Assessment & Plan Note (Signed)
 Some lesions are crusted, no blister,  Pain is controlled.

## 2023-08-14 NOTE — Assessment & Plan Note (Signed)
 Resolved

## 2023-08-14 NOTE — Assessment & Plan Note (Signed)
 Patient has converted to sinus rhythm.  Plan to continue anticoagulation with apixaban , no AV blocker due to risk of bradycardia.

## 2023-08-14 NOTE — Assessment & Plan Note (Signed)
Continue anticoagulation with apixaban.  ?

## 2023-08-14 NOTE — Assessment & Plan Note (Addendum)
 Exacerbation of COPD and ILD.  Multilobar pneumonia. (Present on admission)  Acute on chronic hypoxemic respiratory failure.   Patient has completed course of antibiotic therapy.  Plan to continue slow taper of steroids with prednisone .  Continue airway clearing techniques as tolerated, patient very weak and deconditioned.  Plan to continue supportive supplemental 02 per Anthony [name].  Currently on 4 L/min with good toleration at rest.  Follow up chest radiograph with improvement in infiltrates.

## 2023-08-14 NOTE — Assessment & Plan Note (Signed)
 Multifactorial, now resolved.  Plan to continue supportive care, transfer to SNF

## 2023-08-14 NOTE — Plan of Care (Signed)

## 2023-08-15 DIAGNOSIS — D649 Anemia, unspecified: Secondary | ICD-10-CM

## 2023-08-15 LAB — CBC
HCT: 33.3 % — ABNORMAL LOW (ref 39.0–52.0)
Hemoglobin: 9.4 g/dL — ABNORMAL LOW (ref 13.0–17.0)
MCH: 22.2 pg — ABNORMAL LOW (ref 26.0–34.0)
MCHC: 28.2 g/dL — ABNORMAL LOW (ref 30.0–36.0)
MCV: 78.5 fL — ABNORMAL LOW (ref 80.0–100.0)
Platelets: 206 K/uL (ref 150–400)
RBC: 4.24 MIL/uL (ref 4.22–5.81)
RDW: 27.4 % — ABNORMAL HIGH (ref 11.5–15.5)
WBC: 10.7 K/uL — ABNORMAL HIGH (ref 4.0–10.5)
nRBC: 0 % (ref 0.0–0.2)

## 2023-08-15 LAB — BASIC METABOLIC PANEL WITH GFR
Anion gap: 7 (ref 5–15)
BUN: 23 mg/dL (ref 8–23)
CO2: 27 mmol/L (ref 22–32)
Calcium: 8.3 mg/dL — ABNORMAL LOW (ref 8.9–10.3)
Chloride: 104 mmol/L (ref 98–111)
Creatinine, Ser: 0.97 mg/dL (ref 0.61–1.24)
GFR, Estimated: >60 mL/min (ref >60)
Glucose, Bld: 135 mg/dL — ABNORMAL HIGH (ref 70–99)
Potassium: 3.8 mmol/L (ref 3.5–5.1)
Sodium: 138 mmol/L (ref 135–145)

## 2023-08-15 LAB — GLUCOSE, CAPILLARY: Glucose-Capillary: 137 mg/dL — ABNORMAL HIGH (ref 70–99)

## 2023-08-15 MED ORDER — FERROUS SULFATE 325 (65 FE) MG PO TABS: 325.0000 mg | ORAL_TABLET | Freq: Every day | ORAL | 0 refills | Status: AC

## 2023-08-15 MED ORDER — GLIPIZIDE 10 MG PO TABS: 10.0000 mg | ORAL_TABLET | Freq: Every day | ORAL | Status: AC

## 2023-08-15 MED ORDER — CYANOCOBALAMIN 1000 MCG PO TABS
1000.0000 ug | ORAL_TABLET | Freq: Every day | ORAL | 0 refills | Status: AC
Start: 2023-08-16 — End: ?

## 2023-08-15 MED ORDER — FUROSEMIDE 20 MG PO TABS: 20.0000 mg | ORAL_TABLET | Freq: Every day | ORAL | 0 refills | Status: DC

## 2023-08-15 MED ORDER — FLUTICASONE FUROATE-VILANTEROL 200-25 MCG/ACT IN AEPB
1.0000 | INHALATION_SPRAY | Freq: Every day | RESPIRATORY_TRACT | 0 refills | Status: DC
Start: 2023-08-16 — End: 2023-08-15

## 2023-08-15 MED ORDER — TRELEGY ELLIPTA 200-62.5-25 MCG/ACT IN AEPB: 1.0000 | INHALATION_SPRAY | Freq: Every day | RESPIRATORY_TRACT | Status: AC

## 2023-08-15 MED ORDER — POTASSIUM CHLORIDE CRYS ER 10 MEQ PO TBCR: 10.0000 meq | EXTENDED_RELEASE_TABLET | Freq: Every day | ORAL | 0 refills | Status: AC

## 2023-08-15 MED ORDER — GLUCERNA SHAKE PO LIQD: 237.0000 mL | Freq: Three times a day (TID) | ORAL | 0 refills | Status: AC

## 2023-08-15 MED ORDER — POTASSIUM CHLORIDE CRYS ER 10 MEQ PO TBCR: 10.0000 meq | EXTENDED_RELEASE_TABLET | Freq: Every day | ORAL | Status: DC

## 2023-08-15 NOTE — Progress Notes (Signed)
 [facility] [phone] [facility] Liaison Note  Received request from [facility] for hospice services at [facility] after discharge. Spoke with patient's daughter to initiate education related to hospice philosophy, services and team approach to care. Daughter verbalized understanding of information given. Per discussion, the plan is for discharge home today.   DME needs discussed. Patient has the following equipment in the home: none Family requests the following equipment for delivery: none  Please send signed and completed DNR home with patient/family. Please provide prescriptions at discharge as needed to ensure ongoing symptom management.  [facility] information and contact numbers given to patient's daughter. Please call with any concerns.  Thank you for the opportunity to participate in this patient's care.   Eleanor Nail, LPN [facility] Liaison [phone]

## 2023-08-15 NOTE — Assessment & Plan Note (Signed)
 Noted iron deficiency and low B 12.  Patient as placed on B12 supplementation and will continue iron supplementation.  Plan to follow up cell count as outpatient.

## 2023-08-15 NOTE — Discharge Summary (Addendum)
 Physician Discharge Summary   Patient: Anthony [name]. MRN: [id number] DOB: 1950/03/15  Admit date:     07/26/2023  Discharge date: 08/15/23  Discharge Physician: Elidia Sieving Dorreen Valiente   PCP: No primary care provider on file.   Recommendations at discharge:    Patient will continue bronchodilator therapy, placed on inhaled corticosteroids Completed antibiotic therapy and systemic steroids during his hospitalization.  Placed on furosemide  and Kcl supplements to keep negative fluid balance Holding metoprolol  to avoid bradycardia. Supplemental 02 per [place] to keep -02 saturation 88% or greater.  Follow up with primary care in 7 to 10 days Follow up renal function and electrolytes in 7 days.   Discharge Diagnoses: Principal Problem:   COPD (chronic obstructive pulmonary disease) (HCC) Active Problems:   Acute on chronic diastolic CHF (congestive heart failure) (HCC)   Paroxysmal atrial fibrillation (HCC)   CAD (coronary artery disease)   HTN (hypertension)   Hyperlipidemia associated with type 2 diabetes mellitus (HCC)   History of DVT (deep vein thrombosis)   Zoster   AKI (acute kidney injury) (HCC)   Acute metabolic encephalopathy  Resolved Problems:   * No resolved hospital problems. [facility] ([facility]) Course: Mr. Manning was admitted to the hospital with the working diagnosis of acute on chronic hypoxemic respiratory failure.   73 y.o. male with a past medical history significant for interstitial lung disease, COPD, prior COVID infection, coronary artery disease, diastolic heart failure, paroxysmal atrial fibrillation, DVT, peripheral artery disease s/p left above-knee amputation, diabetes mellitus type 2 and hyperlipidemia.  Patient is a skilled nursing facility resident.  Patient was brought to the hospital after a ground level fall. EMS was called, he was found hypoxemic, with 02 saturation 70%, he was placed on non rebrether mask and was transported to the ED.  On his initial  physical examination patient was confused and not able to give detailed history. His blood pressure was 105/65, HR 68, RR 24, 02 saturation 93% on supplemental 02 per non re-breather mask. Lungs with wheezing and coarse breath sounds bilaterally, heart with S1 and S2 present and regular, abdomen with no distention, left AKA and trace right lower extremity edema.   ABG 7,36/ 39.6/ 57/ 24/ 89%  Na 136, K 4,8 Cl 102 bicarbonate 21 glucose 175 bun 24 cr 2.21  BNP 453  High sensitive troponin 58 and 59  Wbc 21,8 hgb 9,3 plt 299  Sars covid 19 negative Influenza negative  RSV negative  Respiratory viral panel negative   Urine analysis SG 1,014, protein 30, glucose 50, negative leukocytes and negative hgb.  Streptococcal pneumonia urinary antigen negative  Legionella pneumonia urinary antigen negative   CT head no acute intracranial abnormalities, chronic atrophic and ischemic changes.  CT cervical spine with multilevel degenerative change without acute abnormalities.   Chest radiograph with hypoinflation, cardiomegaly, bilateral lower zones increased lung markings.   CT chest with advanced emphysema (centrilobular and paraseptal), bilateral lower lobe and right middle lobe consolidation. No pleural effusion. Signs of honey combing at the right lower lobe per my interpretation. Interlobular septal thickening.  Numerous chronic bilateral rib fractures, chronic right clavicle fracture.   EKG 83 bpm, normal axis, normal intervals, qtc 445, sinus rhythm with left atrial enlargement, positive PVC, no significant ST segment or T wave changes.   Patient was placed on antibiotic therapy, bronchodilator and supplemental 02.  Systemic corticosteroids.  Diuresis with furosemide .   His oxygenation has improved, he has been able to maintain oxygenation with 4 L/min  per [place] with good toleration.  Plan to transfer to SNF to continue physical therapy.   Assessment and Plan: * COPD (chronic obstructive  pulmonary disease) (HCC) Exacerbation of COPD and ILD.  Multilobar pneumonia. (Present on admission)  Acute on chronic hypoxemic respiratory failure.   Patient has completed course of antibiotic therapy.  Plan to continue slow taper of steroids with prednisone .  Continue airway clearing techniques as tolerated, patient very weak and deconditioned.  Plan to continue supportive supplemental 02 per [place].  Currently on 4 L/min with good toleration at rest.  Follow up chest radiograph with improvement in infiltrates.   Acute on chronic diastolic CHF (congestive heart failure) (HCC) Echocardiogram with preserved LV systolic function, RV with reduced systolic function, no significant valvular disease. (Poor acoustic windows)   Patient was placed on furosemide  for diuresis, negative fluid balance was achieved, - 24,631 ml, with significant improvement in his symptoms.   Continue diuresis with furosemide .  Limited therapy due to poor prognosis, related to chronic hypoxemic respiratory failure, advanced emphysema with signs of ILD.   Paroxysmal atrial fibrillation (HCC) Atrial flutter.   Patient has converted to sinus rhythm.  Plan to continue anticoagulation with apixaban , no AV blocker due to risk of bradycardia.   CAD (coronary artery disease) No acute coronary syndrome.  High sensitive troponin elevation due to heart failure exacerbation.  Continue statin therapy.   HTN (hypertension) Continue blood pressure monitoring  Type 2 diabetes mellitus with hyperlipidemia (HCC) Patient was placed on insulin  therapy, sliding scale and basal for glucose control.  He had episodic hyperglycemia, likely steroids induced. Continue glucose monitoring. At discharge patient will resume semaglutide , metformin  and glipizide .   Continue statin therapy.   History of DVT (deep vein thrombosis) Continue anticoagulation with apixaban   Zoster Localized at the lower lumbar region, no signs of over  infection.    Clinically improving, some lesions are crusted, no blisters at this point and pain is controlled.   AKI (acute kidney injury) (HCC) Hyponatremia.  At the time of his discharge his renal function has improved with serum cr at 0,97 , K at 3,8 and serum bicarbonate at 27  Na 138   Plan to continue diuresis with furosemide  and Kcl supplements. Follow up renal function and electrolytes as outpatient in 7 days.    Acute metabolic encephalopathy Multifactorial, (pneumonia, hypoxemia) now resolved.  Plan to continue supportive care, transfer to SNF   Chronic anemia Noted iron deficiency and low B 12.  Patient as placed on B12 supplementation and will continue iron supplementation.  Plan to follow up cell count as outpatient.       Consultants: cardiology and pulmonary  Procedures performed: none   Disposition: Skilled nursing facility Diet recommendation:  Cardiac and Carb modified diet DISCHARGE MEDICATION: Allergies as of 08/15/2023       Reactions   Flomax [tamsulosin Hcl] Other (See Comments)   This medication makes patient feel sick   Penicillins Other (See Comments)   Reaction unknown occurred during childhood Has patient had a PCN reaction causing immediate rash, facial/tongue/throat swelling, SOB or lightheadedness with hypotension: unsure - childhood reaction Has patient had a PCN reaction causing severe rash involving mucus membranes or skin necrosis: unsure - childhood reaction Has patient had a PCN reaction that required hospitalization no Has patient had a PCN reaction occurring within the last 10 years: no If all of the above answers are NO, then may p        Medication List  STOP taking these medications    Biofreeze Cool The Pain 4 % Gel Generic drug: Menthol  (Topical Analgesic)   eszopiclone 2 MG Tabs tablet Commonly known as: LUNESTA   isosorbide  mononitrate 30 MG 24 hr tablet Commonly known as: IMDUR    metoprolol  succinate 25 MG  24 hr tablet Commonly known as: TOPROL -XL   predniSONE  20 MG tablet Commonly known as: DELTASONE    Stiolto Respimat  2.5-2.5 MCG/ACT Aers Generic drug: Tiotropium Bromide-Olodaterol       TAKE these medications    acetaminophen  500 MG tablet Commonly known as: TYLENOL  Take 1,000 mg by mouth in the morning, at noon, and at bedtime.   albuterol  108 (90 Base) MCG/ACT inhaler Commonly known as: VENTOLIN  HFA Inhale 2 puffs into the lungs in the morning, at noon, and at bedtime.   apixaban  5 MG Tabs tablet Commonly known as: ELIQUIS  Take 1 tablet (5 mg total) by mouth 2 (two) times daily.   atorvastatin  40 MG tablet Commonly known as: LIPITOR Take 1 tablet (40 mg total) by mouth daily.   Baclofen  5 MG Tabs Take 5 mg by mouth in the morning and at bedtime.   blood glucose meter kit and supplies Kit Dispense based on patient and insurance preference. Use to check BG once daily or every other other.  Dx: E11.9   cyanocobalamin  1000 MCG tablet Take 1 tablet (1,000 mcg total) by mouth daily. Start taking on: August 16, 2023   diphenhydrAMINE -Phenylephrine  12.5-5 MG/5ML Soln Take 10 mLs by mouth in the morning and at bedtime.   escitalopram  10 MG tablet Commonly known as: LEXAPRO  Take 1 tablet (10 mg total) by mouth at bedtime.   feeding supplement (GLUCERNA SHAKE) Liqd Take 237 mLs by mouth 3 (three) times daily between meals.   ferrous sulfate  325 (65 FE) MG tablet Take 1 tablet (325 mg total) by mouth daily with breakfast. Start taking on: August 16, 2023   furosemide  20 MG tablet Commonly known as: LASIX  Take 1 tablet (20 mg total) by mouth daily. Start taking on: August 16, 2023   gabapentin  100 MG capsule Commonly known as: NEURONTIN  Take 100 mg by mouth 2 (two) times daily.   glipiZIDE  10 MG tablet Commonly known as: GLUCOTROL  Take 1 tablet (10 mg total) by mouth daily before breakfast.   guaiFENesin  600 MG 12 hr tablet Commonly known as: MUCINEX  Take 600  mg by mouth 2 (two) times daily.   ipratropium-albuterol  0.5-2.5 (3) MG/3ML Soln Commonly known as: DUONEB Take 3 mLs by nebulization 3 (three) times daily.   Melatonin 10 MG Tabs Take 10 mg by mouth at bedtime.   metFORMIN  500 MG 24 hr tablet Commonly known as: GLUCOPHAGE -XR TAKE 2 TABLETS BY MOUTH  DAILY WITH BREAKFAST What changed:  how much to take how to take this when to take this additional instructions   nystatin ointment Commonly known as: MYCOSTATIN Apply 1 Application topically 2 (two) times daily. Apply thin layer to groin area   Pataday  0.2 % Soln Generic drug: Olopatadine  HCl Place 1 drop into both eyes daily.   polyethylene glycol 17 g packet Commonly known as: MIRALAX  / GLYCOLAX  Take 17 g by mouth every other day.   potassium chloride  10 MEQ tablet Commonly known as: KLOR-CON  M Take 1 tablet (10 mEq total) by mouth daily.   Semaglutide  7 MG Tabs Take 7 mg by mouth daily.   sodium chloride  0.65 % nasal spray Commonly known as: OCEAN Place 1 spray into the nose 3 (three) times  daily.   Systane 0.4-0.3 % Soln Generic drug: Polyethyl Glycol-Propyl Glycol Place 1 drop into both eyes as needed (for dryness and irritation).   traZODone  100 MG tablet Commonly known as: DESYREL  Take 200 mg by mouth at bedtime.   traZODone  50 MG tablet Commonly known as: DESYREL  Take 25 mg by mouth at bedtime. Take along with 200mg  for a dose of 225mg  nightly   Trelegy Ellipta  200-62.5-25 MCG/ACT Aepb Generic drug: Fluticasone -Umeclidin-Vilant Inhale 1 puff into the lungs daily.        Follow-up Information     Parthenia Olivia HERO, PA-C Follow up on 08/27/2023.   Specialty: Cardiology Why: 8:45AM. Post hospital follow up Contact information: [address] [place] [address] [phone]                Discharge Exam: Filed Weights   08/12/23 0423 08/14/23 0618 08/15/23 0615  Weight: 79.5 kg 80.8 kg 77.5 kg   BP (!) 163/63 (BP Location:  Right Leg)   Pulse 64   Temp 97.6 F (36.4 C) (Axillary)   Resp 17   Ht 5' 9 (1.753 m)   Wt 77.5 kg   SpO2 94%   BMI 25.23 kg/m   Patient is feeling better, no chest pain and dyspnea has been improving, continue very weak and deconditioned.   Neurology awake and alert, deconditioned ENT with mild pallor with no icterus Cardiovascular with S1 and S2 present and regular with no gallops, rubs or murmurs Respiratory with poor inspiratory effort, has mild rales at bases with no wheezing or rhonchi  Abdomen with no distention  Left AKA, no right lower extremity edema.   Condition at discharge: stable  The results of significant diagnostics from this hospitalization (including imaging, microbiology, ancillary and laboratory) are listed below for reference.   Imaging Studies: DG CHEST PORT 1 VIEW Result Date: 08/11/2023 CLINICAL DATA:  200808 Hypoxia [id number] EXAM: PORTABLE CHEST 1 VIEW COMPARISON:  Chest x-ray 07/30/2023. FINDINGS: Patient is rotated. The heart and mediastinal contours are unchanged. Atherosclerotic plaque. Low lung volumes. Question left base and right mid lung zone focal consolidation. No pulmonary edema. No pleural effusion. No pneumothorax. No acute osseous abnormality. IMPRESSION: Low lung volumes with chronic increased interstitial markings. Question left base and right mid lung zone focal consolidation with limited evaluation due to patient rotation and hypoinflation. Recommend repeat chest x-ray PA and lateral view with improved inspiratory effort for further evaluation. Electronically Signed   By: Morgane  Naveau M.D.   On: 08/11/2023 12:15   DG CHEST PORT 1 VIEW Result Date: 07/30/2023 CLINICAL DATA:  200808 Hypoxia [id number] EXAM: PORTABLE CHEST 1 VIEW COMPARISON:  Chest x-ray 07/29/2023, CT angiography 02/12/2021 FINDINGS: The heart and mediastinal contours are unchanged. Atherosclerotic plaque. Biapical pleural/pulmonary scarring. Persistent patchy airspace opacity of  the bilateral, right greater than left, lower lobes. Chronic coarsened interstitial markings with no overt pulmonary edema. No pleural effusion. No pneumothorax. No acute osseous abnormality. IMPRESSION: 1. Persistent patchy airspace opacity of the bilateral, right greater than left, lower lobes. Question multifocal pneumonia. Followup PA and lateral chest X-ray is recommended in 3-4 weeks following therapy to ensure resolution and exclude underlying malignancy. 2. Aortic Atherosclerosis (ICD10-I70.0) and Emphysema (ICD10-J43.9). Electronically Signed   By: Morgane  Naveau M.D.   On: 07/30/2023 13:32   DG CHEST PORT 1 VIEW Result Date: 07/29/2023 EXAM: 1 VIEW XRAY OF THE CHEST 07/29/2023 12:40:00 AM COMPARISON: 07/27/2023 CXR CLINICAL HISTORY: SOB (shortness of breath) 141880. Shortness of breath FINDINGS: LUNGS AND  PLEURA: Mild peripheral scarring in the lungs bilaterally. Mild patchy bilateral lower lobe opacities, atelectasis versus pneumonia. No pleural effusion. No pneumothorax. HEART AND MEDIASTINUM: Aortic atherosclerosis. No acute abnormality of the cardiac and mediastinal silhouettes. BONES AND SOFT TISSUES: No acute osseous abnormality. IMPRESSION: 1. Mild patchy bilateral lower lobe opacities, atelectasis versus pneumonia. Electronically signed by: Pinkie Pebbles MD 07/29/2023 12:53 AM EDT RP Workstation: [id number]   DG CHEST PORT 1 VIEW Result Date: 07/27/2023 CLINICAL DATA:  Shortness of breath. EXAM: PORTABLE CHEST 1 VIEW COMPARISON:  Radiograph and CT yesterday FINDINGS: Lung volumes are low. Stable heart size and mediastinal contours. Again seen bibasilar and peripheral lower lung zone opacities. No pneumothorax. No large pleural effusion by radiograph. IMPRESSION: Low lung volumes with bibasilar and peripheral lower lung zone opacities, unchanged from yesterday. Electronically Signed   By: Andrea Gasman M.D.   On: 07/27/2023 18:04   ECHOCARDIOGRAM COMPLETE Result Date: 07/26/2023     ECHOCARDIOGRAM REPORT   Patient Name:   Herold Salguero. Date of Exam: 07/26/2023 Medical Rec #:  [id number]          Height:       68.0 in Accession #:    [id number]         Weight:       203.0 lb Date of Birth:  28-Sep-1950          BSA:          2.057 m Patient Age:    72 years           BP:           108/71 mmHg Patient Gender: M                  HR:           76 bpm. Exam Location:  Inpatient Procedure: 2D Echo (Both Spectral and Color Flow Doppler were utilized during            procedure). Indications:    congestive heart failure  History:        Patient has no prior history of Echocardiogram examinations,                 most recent 02/21/2021. CAD, COPD; Risk Factors:Hypertension,                 Dyslipidemia and Diabetes.  Sonographer:    Tinnie Barefoot RDCS Referring Phys: [id number] DORN DAWSON  Sonographer Comments: Image acquisition challenging due to respiratory motion and Image acquisition challenging due to COPD. IMPRESSIONS  1. Difficult acoustic windows Overall LV function is normal to mildly decreased Would recomm limited imaging with Definity to evaluate regional wall motion and overall LV function. . The left ventricle has no regional wall motion abnormalities. Left ventricular diastolic parameters were normal.  2. Right ventricular systolic function reduced. The right ventricular size is normal.  3. The mitral valve is normal in structure. Trivial mitral valve regurgitation.  4. The aortic valve is tricuspid. Aortic valve regurgitation is not visualized. Aortic valve sclerosis/calcification is present, without any evidence of aortic stenosis.  5. The inferior vena cava is dilated in size with <50% respiratory variability, suggesting right atrial pressure of 15 mmHg. FINDINGS  Left Ventricle: Difficult acoustic windows Overall LV function is normal to mildly decreased Would recomm limited imaging with Definity to evaluate regional wall motion and overall LV function. The left ventricle has no  regional wall motion abnormalities. The left ventricular internal cavity size  was normal in size. There is no left ventricular hypertrophy. Left ventricular diastolic parameters were normal. Right Ventricle: The right ventricular size is normal. Right vetricular wall thickness was not assessed. Right ventricular systolic function reduced. Left Atrium: Left atrial size was normal in size. Right Atrium: Right atrial size was normal in size. Pericardium: There is no evidence of pericardial effusion. Mitral Valve: The mitral valve is normal in structure. Trivial mitral valve regurgitation. Tricuspid Valve: The tricuspid valve is normal in structure. Tricuspid valve regurgitation is trivial. Aortic Valve: The aortic valve is tricuspid. Aortic valve regurgitation is not visualized. Aortic valve sclerosis/calcification is present, without any evidence of aortic stenosis. Pulmonic Valve: The pulmonic valve was normal in structure. Pulmonic valve regurgitation is not visualized. Aorta: The aortic root and ascending aorta are structurally normal, with no evidence of dilitation. Venous: The inferior vena cava is dilated in size with less than 50% respiratory variability, suggesting right atrial pressure of 15 mmHg. IAS/Shunts: No atrial level shunt detected by color flow Doppler.  LEFT VENTRICLE PLAX 2D LVIDd:         5.50 cm     Diastology LVIDs:         3.30 cm     LV e' medial:    5.55 cm/s LV PW:         0.90 cm     LV E/e' medial:  9.3 LV IVS:        0.70 cm     LV e' lateral:   11.60 cm/s LVOT diam:     2.10 cm     LV E/e' lateral: 4.5 LV SV:         54 LV SV Index:   26 LVOT Area:     3.46 cm  LV Volumes (MOD) LV vol d, MOD A2C: 97.0 ml LV vol s, MOD A2C: 43.0 ml LV SV MOD A2C:     54.0 ml RIGHT VENTRICLE             IVC RV S prime:     15.70 cm/s  IVC diam: 1.70 cm TAPSE (M-mode): 2.1 cm LEFT ATRIUM           Index LA Vol (A2C): 37.1 ml 18.04 ml/m LA Vol (A4C): 38.2 ml 18.57 ml/m  AORTIC VALVE LVOT Vmax:   85.50  cm/s LVOT Vmean:  53.900 cm/s LVOT VTI:    0.155 m  AORTA Ao Root diam: 3.70 cm Ao Asc diam:  3.60 cm MITRAL VALVE MV Area (PHT): 3.85 cm    SHUNTS MV Decel Time: 197 msec    Systemic VTI:  0.16 m MV E velocity: 51.80 cm/s  Systemic Diam: 2.10 cm MV A velocity: 69.80 cm/s MV E/A ratio:  0.74 Vina Gull MD Electronically signed by Vina Gull MD Signature Date/Time: 07/26/2023/4:29:12 PM    Final    CT CHEST WO CONTRAST Result Date: 07/26/2023 CLINICAL DATA:  73 year old male with recent fall. Pain. COPD. Respiratory failure. EXAM: CT CHEST WITHOUT CONTRAST TECHNIQUE: Multidetector CT imaging of the chest was performed following the standard protocol without IV contrast. RADIATION DOSE REDUCTION: This exam was performed according to the departmental dose-optimization program which includes automated exposure control, adjustment of the mA and/or kV according to patient size and/or use of iterative reconstruction technique. COMPARISON:  Chest CT 02/12/2021. FINDINGS: Cardiovascular: Calcified aortic atherosclerosis. Advanced calcified coronary artery atherosclerosis and/or stent. Vascular patency is not evaluated in the absence of IV contrast. Mild cardiomegaly, not significantly changed. No pericardial effusion.  Mediastinum/Nodes: Reactive appearing small but numerous mediastinal lymph nodes. Lungs/Pleura: Moderate to severe emphysema. Respiratory motion. Atelectatic changes to the major airways which are patent through the carina and mainstem bronchi. Bilateral lower lobe consolidation, and bilateral lower lobe and right middle lobe partial airway collapse or opacification. Consolidation also in the lateral segment of the right middle lobe. No pleural effusion. Underlying pulmonary vascularity appears normal. Upper Abdomen: Negative visible noncontrast liver, gallbladder, spleen, pancreas, adrenal glands, right kidney, bowel. No pneumoperitoneum or free fluid in the visible upper abdomen. Musculoskeletal: Stable  thoracic vertebral height and alignment since 2023. Sternum and left shoulder osseous structures appear intact. Chronic medial right clavicle fracture is stable since 2023. Chronic left anterior, lateral, posterior left rib fractures. No acute left rib fracture is identified. Chronic right anterior rib fractures. Chronic, partially sclerotic but unhealed right posterolateral 9th rib fracture, was present in 2023. Widely displaced posterolateral right 10th rib fracture on series 4, image 134. That rib was not included previously, but this is favored to be chronic. And there are superimposed chronic fractures of the right 9th, 10th, 11th costovertebral junctions. No acute superficial soft tissue injury identified. IMPRESSION: 1. Advanced Emphysema (ICD10-J43.9) with superimposed Bilateral lower lobe and Right middle lobe Consolidated Pneumonia. No pleural effusion. 2. Numerous chronic bilateral rib fractures. Chronic right clavicle fracture. No definite acute traumatic injury in the noncontrast Chest. 3.  Aortic Atherosclerosis (ICD10-I70.0). Electronically Signed   By: VEAR Hurst M.D.   On: 07/26/2023 06:06   CT Head Wo Contrast Result Date: 07/26/2023 CLINICAL DATA:  Recent fall with headaches and neck pain, initial encounter EXAM: CT HEAD WITHOUT CONTRAST CT CERVICAL SPINE WITHOUT CONTRAST TECHNIQUE: Multidetector CT imaging of the head and cervical spine was performed following the standard protocol without intravenous contrast. Multiplanar CT image reconstructions of the cervical spine were also generated. RADIATION DOSE REDUCTION: This exam was performed according to the departmental dose-optimization program which includes automated exposure control, adjustment of the mA and/or kV according to patient size and/or use of iterative reconstruction technique. COMPARISON:  None Available. FINDINGS: CT HEAD FINDINGS Brain: No evidence of acute infarction, hemorrhage, hydrocephalus, extra-axial collection or mass  lesion/mass effect. Mild atrophic changes and chronic white matter ischemic changes are seen. Vascular: No hyperdense vessel or unexpected calcification. Skull: Normal. Negative for fracture or focal lesion. Sinuses/Orbits: No acute finding. Other: None. CT CERVICAL SPINE FINDINGS Alignment: Within normal limits. Skull base and vertebrae: 7 cervical segments are well visualized. Vertebral body height is well maintained. Mild osteophytic changes are seen. Facet hypertrophic changes are noted as well. No acute fracture or acute facet abnormality is noted. The odontoid is within normal limits. Soft tissues and spinal canal: Findings soft tissue structures are within normal limits. Upper chest: Visualized lung apices show emphysematous changes and biapical scarring. Other: None IMPRESSION: CT of the head: No acute intracranial abnormality noted. Chronic atrophic and ischemic changes. CT of the cervical spine: Multilevel degenerative change without acute abnormality. Electronically Signed   By: Oneil Devonshire M.D.   On: 07/26/2023 02:52   CT Cervical Spine Wo Contrast Result Date: 07/26/2023 CLINICAL DATA:  Recent fall with headaches and neck pain, initial encounter EXAM: CT HEAD WITHOUT CONTRAST CT CERVICAL SPINE WITHOUT CONTRAST TECHNIQUE: Multidetector CT imaging of the head and cervical spine was performed following the standard protocol without intravenous contrast. Multiplanar CT image reconstructions of the cervical spine were also generated. RADIATION DOSE REDUCTION: This exam was performed according to the departmental dose-optimization program which includes automated  exposure control, adjustment of the mA and/or kV according to patient size and/or use of iterative reconstruction technique. COMPARISON:  None Available. FINDINGS: CT HEAD FINDINGS Brain: No evidence of acute infarction, hemorrhage, hydrocephalus, extra-axial collection or mass lesion/mass effect. Mild atrophic changes and chronic white matter  ischemic changes are seen. Vascular: No hyperdense vessel or unexpected calcification. Skull: Normal. Negative for fracture or focal lesion. Sinuses/Orbits: No acute finding. Other: None. CT CERVICAL SPINE FINDINGS Alignment: Within normal limits. Skull base and vertebrae: 7 cervical segments are well visualized. Vertebral body height is well maintained. Mild osteophytic changes are seen. Facet hypertrophic changes are noted as well. No acute fracture or acute facet abnormality is noted. The odontoid is within normal limits. Soft tissues and spinal canal: Findings soft tissue structures are within normal limits. Upper chest: Visualized lung apices show emphysematous changes and biapical scarring. Other: None IMPRESSION: CT of the head: No acute intracranial abnormality noted. Chronic atrophic and ischemic changes. CT of the cervical spine: Multilevel degenerative change without acute abnormality. Electronically Signed   By: Oneil Devonshire M.D.   On: 07/26/2023 02:52   DG Chest Port 1 View Result Date: 07/26/2023 EXAM: 1 VIEW XRAY OF THE CHEST 07/26/2023 01:48:00 AM COMPARISON: 03/07/2021 CLINICAL HISTORY: Shortness of breath. Encounter for shortness of breath. FINDINGS: LUNGS AND PLEURA: Low lung volumes. Increased interstitial markings in the lower lung suspicious for edema. Retrocardiac airspace opacities. Right basilar atelectasis. Small left pleural effusion. No pneumothorax. HEART AND MEDIASTINUM: Accentuated cardiomediastinal silhouette and pulmonary vascularity due to hypoinflation. BONES AND SOFT TISSUES: No acute osseous abnormality. IMPRESSION: 1. Increased interstitial markings in the lower lung suspicious for edema. 2. Small left pleural effusion. 3. Bibasilar atelectasis vs pneumonia. Electronically signed by: Norman Gatlin MD 07/26/2023 01:54 AM EDT RP Workstation: [id number]    Microbiology: Results for orders placed or performed during the hospital encounter of 07/26/23  Resp panel by RT-PCR  (RSV, Flu A&B, Covid) Anterior Nasal Swab     Status: None   Collection Time: 07/26/23  5:32 AM   Specimen: Anterior Nasal Swab  Result Value Ref Range Status   SARS Coronavirus 2 by RT PCR NEGATIVE NEGATIVE Final   Influenza A by PCR NEGATIVE NEGATIVE Final   Influenza B by PCR NEGATIVE NEGATIVE Final    Comment: (NOTE) The Xpert Xpress SARS-CoV-2/FLU/RSV plus assay is intended as an aid in the diagnosis of influenza from Nasopharyngeal swab specimens and should not be used as a sole basis for treatment. Nasal washings and aspirates are unacceptable for Xpert Xpress SARS-CoV-2/FLU/RSV testing.  Fact Sheet for Patients: BloggerCourse.com  Fact Sheet for Healthcare Providers: SeriousBroker.it  This test is not yet approved or cleared by the [place]  FDA and has been authorized for detection and/or diagnosis of SARS-CoV-2 by FDA under an Emergency Use Authorization (EUA). This EUA will remain in effect (meaning this test can be used) for the duration of the COVID-19 declaration under Section 564(b)(1) of the Act, 21 U.S.C. section 360bbb-3(b)(1), unless the authorization is terminated or revoked.     Resp Syncytial Virus by PCR NEGATIVE NEGATIVE Final    Comment: (NOTE) Fact Sheet for Patients: BloggerCourse.com  Fact Sheet for Healthcare Providers: SeriousBroker.it  This test is not yet approved or cleared by the [place]  FDA and has been authorized for detection and/or diagnosis of SARS-CoV-2 by FDA under an Emergency Use Authorization (EUA). This EUA will remain in effect (meaning this test can be used) for the duration of the COVID-19 declaration under Section  564(b)(1) of the Act, 21 U.S.C. section 360bbb-3(b)(1), unless the authorization is terminated or revoked.  Performed at [facility], [address] [place], [place] [address]   Culture, blood  (Routine X 2) w Reflex to ID Panel     Status: None   Collection Time: 07/26/23  5:24 PM   Specimen: BLOOD LEFT HAND  Result Value Ref Range Status   Specimen Description BLOOD LEFT HAND  Final   Special Requests   Final    BOTTLES DRAWN AEROBIC AND ANAEROBIC Blood Culture results may not be optimal due to an inadequate volume of blood received in culture bottles   Culture   Final    NO GROWTH 5 DAYS Performed at [facility], [address] [place], [place] [address]    Report Status 07/31/2023 FINAL  Final  Culture, blood (Routine X 2) w Reflex to ID Panel     Status: None   Collection Time: 07/26/23  5:26 PM   Specimen: BLOOD RIGHT HAND  Result Value Ref Range Status   Specimen Description BLOOD RIGHT HAND  Final   Special Requests   Final    BOTTLES DRAWN AEROBIC AND ANAEROBIC Blood Culture results may not be optimal due to an inadequate volume of blood received in culture bottles   Culture   Final    NO GROWTH 5 DAYS Performed at [facility], [address] [place], [place] [address]    Report Status 07/31/2023 FINAL  Final  Respiratory (~20 pathogens) panel by PCR     Status: None   Collection Time: 07/26/23  5:55 PM   Specimen: Nasopharyngeal Swab; Respiratory  Result Value Ref Range Status   Adenovirus NOT DETECTED NOT DETECTED Final   Coronavirus 229E NOT DETECTED NOT DETECTED Final    Comment: (NOTE) The Coronavirus on the Respiratory Panel, DOES NOT test for the novel  Coronavirus (2019 nCoV)    Coronavirus HKU1 NOT DETECTED NOT DETECTED Final   Coronavirus NL63 NOT DETECTED NOT DETECTED Final   Coronavirus OC43 NOT DETECTED NOT DETECTED Final   Metapneumovirus NOT DETECTED NOT DETECTED Final   Rhinovirus / Enterovirus NOT DETECTED NOT DETECTED Final   Influenza A NOT DETECTED NOT DETECTED Final   Influenza B NOT DETECTED NOT DETECTED Final   Parainfluenza Virus 1 NOT DETECTED NOT DETECTED Final   Parainfluenza Virus 2 NOT DETECTED NOT DETECTED  Final   Parainfluenza Virus 3 NOT DETECTED NOT DETECTED Final   Parainfluenza Virus 4 NOT DETECTED NOT DETECTED Final   Respiratory Syncytial Virus NOT DETECTED NOT DETECTED Final   Bordetella pertussis NOT DETECTED NOT DETECTED Final   Bordetella Parapertussis NOT DETECTED NOT DETECTED Final   Chlamydophila pneumoniae NOT DETECTED NOT DETECTED Final   Mycoplasma pneumoniae NOT DETECTED NOT DETECTED Final    Comment: Performed at [facility], [address] [place], [place] [address]  MRSA Next Gen by PCR, Nasal     Status: None   Collection Time: 07/27/23 12:40 AM   Specimen: Nasal Mucosa; Nasal Swab  Result Value Ref Range Status   MRSA by PCR Next Gen NOT DETECTED NOT DETECTED Final    Comment: (NOTE) The GeneXpert MRSA Assay (FDA approved for NASAL specimens only), is one component of a comprehensive MRSA colonization surveillance program. It is not intended to diagnose MRSA infection nor to guide or monitor treatment for MRSA infections. Test performance is not FDA approved in patients less than 61 years old. Performed at  [facility], [address] [place], [place] [address]     Labs: CBC: Recent Labs  Lab 08/09/23 0248 08/11/23 0347 08/13/23 0547 08/15/23 0510  WBC 17.3* 16.3* 13.6* 10.7*  HGB 8.6* 8.7* 9.1* 9.4*  HCT 30.7* 31.4* 32.1* 33.3*  MCV 74.9* 76.4* 76.8* 78.5*  PLT 322 294 262 206   Basic Metabolic Panel: Recent Labs  Lab 08/11/23 0347 08/13/23 0547 08/15/23 0510  NA 137 138 138  K 4.4 3.8 3.8  CL 104 103 104  CO2 24 29 27   GLUCOSE 245* 168* 135*  BUN 24* 24* 23  CREATININE 0.90 0.96 0.97  CALCIUM  8.3* 8.5* 8.3*   Liver Function Tests: No results for input(s): AST, ALT, ALKPHOS, BILITOT, PROT, ALBUMIN  in the last 168 hours. CBG: Recent Labs  Lab 08/14/23 0837 08/14/23 1128 08/14/23 1641 08/14/23 2107 08/15/23 0724  GLUCAP 225* 202* 140* 146* 137*    Discharge time spent: greater than 30  minutes.  Signed: Elidia Toribio Furnace, MD [facility] 08/15/2023

## 2023-08-15 NOTE — Progress Notes (Signed)
 MD ordered 'remove PICC - patient currently has midline placed by IVT. Per policy, unit is able to remove as it is a peripheral line. Secure chat sent to RN to follow up. Will discontinue PICC removal order.   Mearl Harewood R Murray Guzzetta, RN

## 2023-08-15 NOTE — TOC Progression Note (Signed)
 Transition of Care (TOC) - Progression Note    Patient Details  Name: Anthony [name]. MRN: [id number] Date of Birth: [birth year]  Transition of Care [facility]) CM/SW Contact  Isaiah Public, LCSWA Phone Number: 08/15/2023, 10:30 AM  Clinical Narrative:      Star with [facility] confirmed they can accept patient back today if medically stable. Patients daughter Cena agreeable for CSW to make referral to [facility] for hospice services to follow patient at SNF. CSW made referral to [facility] with [facility] for hospice services to follow patient at [facility]. CSW will continue to follow and assist with patients dc planning needs.   Expected Discharge Plan: Long Term Nursing Home Barriers to Discharge: Continued Medical Work up               Expected Discharge Plan and Services In-house Referral: Clinical Social Work   Post Acute Care Choice: Skilled Nursing Facility Living arrangements for the past 2 months: Skilled Nursing Facility Expected Discharge Date: 08/15/23                                     Social Drivers of Health (SDOH) Interventions SDOH Screenings   Food Insecurity: Patient Unable To Answer (07/26/2023)  Housing: Patient Unable To Answer (07/26/2023)  Transportation Needs: Patient Unable To Answer (07/26/2023)  Utilities: Patient Unable To Answer (07/26/2023)  Depression (PHQ2-9): Medium Risk (11/27/2019)  Financial Resource Strain: Low Risk  (09/15/2020)   Received from [facility]  Social Connections: Patient Unable To Answer (07/26/2023)  Stress: No Stress Concern Present (09/15/2020)   Received from [facility]  Tobacco Use: Medium Risk (07/26/2023)    Readmission Risk Interventions    02/13/2021    3:47 PM  Readmission Risk Prevention Plan  Transportation Screening Complete  PCP or Specialist Appt within 5-7 Days Complete  Home Care Screening Complete  Medication Review (RN CM) Complete

## 2023-08-15 NOTE — Plan of Care (Signed)
 Problem: Education: Goal: Ability to describe self-care measures that may prevent or decrease complications (Diabetes Survival Skills Education) will improve 08/15/2023 1236 by Norville President, RN Outcome: Adequate for Discharge 08/15/2023 1149 by Norville President, RN Outcome: Progressing Goal: Individualized Educational Video(s) 08/15/2023 1236 by Norville President, RN Outcome: Adequate for Discharge 08/15/2023 1149 by Norville President, RN Outcome: Progressing   Problem: Coping: Goal: Ability to adjust to condition or change in health will improve 08/15/2023 1236 by Norville President, RN Outcome: Adequate for Discharge 08/15/2023 1149 by Norville President, RN Outcome: Progressing   Problem: Fluid Volume: Goal: Ability to maintain a balanced intake and output will improve 08/15/2023 1236 by Norville President, RN Outcome: Adequate for Discharge 08/15/2023 1149 by Norville President, RN Outcome: Progressing   Problem: Health Behavior/Discharge Planning: Goal: Ability to identify and utilize available resources and services will improve 08/15/2023 1236 by Norville President, RN Outcome: Adequate for Discharge 08/15/2023 1149 by Norville President, RN Outcome: Progressing Goal: Ability to manage health-related needs will improve 08/15/2023 1236 by Norville President, RN Outcome: Adequate for Discharge 08/15/2023 1149 by Norville President, RN Outcome: Progressing   Problem: Metabolic: Goal: Ability to maintain appropriate glucose levels will improve 08/15/2023 1236 by Norville President, RN Outcome: Adequate for Discharge 08/15/2023 1149 by Norville President, RN Outcome: Progressing   Problem: Nutritional: Goal: Maintenance of adequate nutrition will improve 08/15/2023 1236 by Norville President, RN Outcome: Adequate for Discharge 08/15/2023 1149 by Norville President, RN Outcome: Progressing Goal: Progress toward achieving an optimal weight will improve 08/15/2023 1236 by Norville President, RN Outcome: Adequate for Discharge 08/15/2023 1149 by Norville President,  RN Outcome: Progressing   Problem: Skin Integrity: Goal: Risk for impaired skin integrity will decrease 08/15/2023 1236 by Norville President, RN Outcome: Adequate for Discharge 08/15/2023 1149 by Norville President, RN Outcome: Progressing   Problem: Tissue Perfusion: Goal: Adequacy of tissue perfusion will improve 08/15/2023 1236 by Norville President, RN Outcome: Adequate for Discharge 08/15/2023 1149 by Norville President, RN Outcome: Progressing   Problem: Education: Goal: Knowledge of General Education information will improve Description: Including pain rating scale, medication(s)/side effects and non-pharmacologic comfort measures 08/15/2023 1236 by Norville President, RN Outcome: Adequate for Discharge 08/15/2023 1149 by Norville President, RN Outcome: Progressing   Problem: Health Behavior/Discharge Planning: Goal: Ability to manage health-related needs will improve 08/15/2023 1236 by Norville President, RN Outcome: Adequate for Discharge 08/15/2023 1149 by Norville President, RN Outcome: Progressing   Problem: Clinical Measurements: Goal: Ability to maintain clinical measurements within normal limits will improve 08/15/2023 1236 by Norville President, RN Outcome: Adequate for Discharge 08/15/2023 1149 by Norville President, RN Outcome: Progressing Goal: Will remain free from infection 08/15/2023 1236 by Norville President, RN Outcome: Adequate for Discharge 08/15/2023 1149 by Norville President, RN Outcome: Progressing Goal: Diagnostic test results will improve 08/15/2023 1236 by Norville President, RN Outcome: Adequate for Discharge 08/15/2023 1149 by Norville President, RN Outcome: Progressing Goal: Respiratory complications will improve 08/15/2023 1236 by Norville President, RN Outcome: Adequate for Discharge 08/15/2023 1149 by Norville President, RN Outcome: Progressing Goal: Cardiovascular complication will be avoided 08/15/2023 1236 by Norville President, RN Outcome: Adequate for Discharge 08/15/2023 1149 by Norville President, RN Outcome: Progressing   Problem:  Activity: Goal: Risk for activity intolerance will decrease 08/15/2023 1236 by Norville President, RN Outcome: Adequate for Discharge 08/15/2023 1149 by Norville President, RN Outcome: Progressing   Problem: Nutrition: Goal: Adequate nutrition will be maintained 08/15/2023 1236 by Norville President, RN Outcome: Adequate for Discharge 08/15/2023 1149 by Norville President, RN Outcome: Progressing  Problem: Coping: Goal: Level of anxiety will decrease 08/15/2023 1236 by Norville President, RN Outcome: Adequate for Discharge 08/15/2023 1149 by Norville President, RN Outcome: Progressing   Problem: Elimination: Goal: Will not experience complications related to bowel motility 08/15/2023 1236 by Norville President, RN Outcome: Adequate for Discharge 08/15/2023 1149 by Norville President, RN Outcome: Progressing Goal: Will not experience complications related to urinary retention 08/15/2023 1236 by Norville President, RN Outcome: Adequate for Discharge 08/15/2023 1149 by Norville President, RN Outcome: Progressing   Problem: Pain Managment: Goal: General experience of comfort will improve and/or be controlled 08/15/2023 1236 by Norville President, RN Outcome: Adequate for Discharge 08/15/2023 1149 by Norville President, RN Outcome: Progressing   Problem: Safety: Goal: Ability to remain free from injury will improve 08/15/2023 1236 by Norville President, RN Outcome: Adequate for Discharge 08/15/2023 1149 by Norville President, RN Outcome: Progressing   Problem: Skin Integrity: Goal: Risk for impaired skin integrity will decrease 08/15/2023 1236 by Norville President, RN Outcome: Adequate for Discharge 08/15/2023 1149 by Norville President, RN Outcome: Progressing   Problem: Education: Goal: Knowledge of disease or condition will improve 08/15/2023 1236 by Norville President, RN Outcome: Adequate for Discharge 08/15/2023 1149 by Norville President, RN Outcome: Progressing Goal: Knowledge of the prescribed therapeutic regimen will improve 08/15/2023 1236 by Norville President, RN Outcome:  Adequate for Discharge 08/15/2023 1149 by Norville President, RN Outcome: Progressing Goal: Individualized Educational Video(s) 08/15/2023 1236 by Norville President, RN Outcome: Adequate for Discharge 08/15/2023 1149 by Norville President, RN Outcome: Progressing   Problem: Activity: Goal: Ability to tolerate increased activity will improve 08/15/2023 1236 by Norville President, RN Outcome: Adequate for Discharge 08/15/2023 1149 by Norville President, RN Outcome: Progressing Goal: Will verbalize the importance of balancing activity with adequate rest periods 08/15/2023 1236 by Norville President, RN Outcome: Adequate for Discharge 08/15/2023 1149 by Norville President, RN Outcome: Progressing   Problem: Respiratory: Goal: Ability to maintain a clear airway will improve 08/15/2023 1236 by Norville President, RN Outcome: Adequate for Discharge 08/15/2023 1149 by Norville President, RN Outcome: Progressing Goal: Levels of oxygenation will improve 08/15/2023 1236 by Norville President, RN Outcome: Adequate for Discharge 08/15/2023 1149 by Norville President, RN Outcome: Progressing Goal: Ability to maintain adequate ventilation will improve 08/15/2023 1236 by Norville President, RN Outcome: Adequate for Discharge 08/15/2023 1149 by Norville President, RN Outcome: Progressing   Problem: Education: Goal: Knowledge of disease or condition will improve 08/15/2023 1236 by Norville President, RN Outcome: Adequate for Discharge 08/15/2023 1149 by Norville President, RN Outcome: Progressing Goal: Understanding of medication regimen will improve 08/15/2023 1236 by Norville President, RN Outcome: Adequate for Discharge 08/15/2023 1149 by Norville President, RN Outcome: Progressing Goal: Individualized Educational Video(s) 08/15/2023 1236 by Norville President, RN Outcome: Adequate for Discharge 08/15/2023 1149 by Norville President, RN Outcome: Progressing   Problem: Activity: Goal: Ability to tolerate increased activity will improve 08/15/2023 1236 by Norville President, RN Outcome: Adequate for  Discharge 08/15/2023 1149 by Norville President, RN Outcome: Progressing   Problem: Cardiac: Goal: Ability to achieve and maintain adequate cardiopulmonary perfusion will improve 08/15/2023 1236 by Norville President, RN Outcome: Adequate for Discharge 08/15/2023 1149 by Norville President, RN Outcome: Progressing   Problem: Health Behavior/Discharge Planning: Goal: Ability to safely manage health-related needs after discharge will improve 08/15/2023 1236 by Norville President, RN Outcome: Adequate for Discharge 08/15/2023 1149 by Norville President, RN Outcome: Progressing

## 2023-08-15 NOTE — TOC Transition Note (Signed)
 Transition of Care [facility]) - Discharge Note   Patient Details  Name: Anthony [name]. MRN: [id number] Date of Birth: 01-Jan-1951  Transition [facility] CM/SW Contact:  Isaiah Public, LCSWA Phone Number: 08/15/2023, 10:46 AM   Clinical Narrative:     Patient will DC to: [facility]   Anticipated DC date: 08/15/2023  Family notified: [name]   Transport by: [facility]  ?  Per MD patient ready for DC to [facility] SNF with hospice services to follow . RN, patient, patient's family, Melissa with [facility],and facility notified of DC. Discharge Summary sent to facility. RN given number for report [phone] RM# [id number]. DC packet on chart. DNR signed by MD attached to patients DC packet. Ambulance transport requested for patient.  CSW signing off.    Final next level of care: Skilled Nursing Facility Barriers to Discharge: No Barriers Identified   Patient Goals and CMS Choice     Choice offered to / list presented to : Adult Children (daughter Cena)      Discharge Placement              Patient chooses bed at: [facility] Patient to be transferred to facility by: [facility] Name of family member notified: [name] Patient and family notified of of transfer: 08/15/23  Discharge Plan and Services Additional resources added to the After Visit Summary for   In-house Referral: Clinical Social Work   Post Acute Care Choice: Skilled Nursing Facility                               Social Drivers of Health (SDOH) Interventions SDOH Screenings   Food Insecurity: Patient Unable To Answer (07/26/2023)  Housing: Patient Unable To Answer (07/26/2023)  Transportation Needs: Patient Unable To Answer (07/26/2023)  Utilities: Patient Unable To Answer (07/26/2023)  Depression (PHQ2-9): Medium Risk (11/27/2019)  Financial Resource Strain: Low Risk  (09/15/2020)   Received from [facility]  Social Connections: Patient Unable To Answer (07/26/2023)  Stress: No Stress Concern  Present (09/15/2020)   Received from [facility]  Tobacco Use: Medium Risk (07/26/2023)     Readmission Risk Interventions    02/13/2021    3:47 PM  Readmission Risk Prevention Plan  Transportation Screening Complete  PCP or Specialist Appt within 5-7 Days Complete  Home Care Screening Complete  Medication Review (RN CM) Complete

## 2023-08-15 NOTE — Plan of Care (Signed)
  Problem: Education: Goal: Ability to describe self-care measures that may prevent or decrease complications (Diabetes Survival Skills Education) will improve Outcome: Progressing Goal: Individualized Educational Video(s) Outcome: Progressing   Problem: Coping: Goal: Ability to adjust to condition or change in health will improve Outcome: Progressing   Problem: Fluid Volume: Goal: Ability to maintain a balanced intake and output will improve Outcome: Progressing   Problem: Health Behavior/Discharge Planning: Goal: Ability to identify and utilize available resources and services will improve Outcome: Progressing Goal: Ability to manage health-related needs will improve Outcome: Progressing   Problem: Metabolic: Goal: Ability to maintain appropriate glucose levels will improve Outcome: Progressing   Problem: Nutritional: Goal: Maintenance of adequate nutrition will improve Outcome: Progressing Goal: Progress toward achieving an optimal weight will improve Outcome: Progressing   Problem: Skin Integrity: Goal: Risk for impaired skin integrity will decrease Outcome: Progressing   Problem: Tissue Perfusion: Goal: Adequacy of tissue perfusion will improve Outcome: Progressing   Problem: Education: Goal: Knowledge of General Education information will improve Description: Including pain rating scale, medication(s)/side effects and non-pharmacologic comfort measures Outcome: Progressing   Problem: Health Behavior/Discharge Planning: Goal: Ability to manage health-related needs will improve Outcome: Progressing   Problem: Clinical Measurements: Goal: Ability to maintain clinical measurements within normal limits will improve Outcome: Progressing Goal: Will remain free from infection Outcome: Progressing Goal: Diagnostic test results will improve Outcome: Progressing Goal: Respiratory complications will improve Outcome: Progressing Goal: Cardiovascular complication will  be avoided Outcome: Progressing   Problem: Activity: Goal: Risk for activity intolerance will decrease Outcome: Progressing   Problem: Nutrition: Goal: Adequate nutrition will be maintained Outcome: Progressing   Problem: Coping: Goal: Level of anxiety will decrease Outcome: Progressing   Problem: Elimination: Goal: Will not experience complications related to bowel motility Outcome: Progressing Goal: Will not experience complications related to urinary retention Outcome: Progressing   Problem: Pain Managment: Goal: General experience of comfort will improve and/or be controlled Outcome: Progressing   Problem: Safety: Goal: Ability to remain free from injury will improve Outcome: Progressing   Problem: Skin Integrity: Goal: Risk for impaired skin integrity will decrease Outcome: Progressing   Problem: Education: Goal: Knowledge of disease or condition will improve Outcome: Progressing Goal: Knowledge of the prescribed therapeutic regimen will improve Outcome: Progressing Goal: Individualized Educational Video(s) Outcome: Progressing   Problem: Activity: Goal: Ability to tolerate increased activity will improve Outcome: Progressing Goal: Will verbalize the importance of balancing activity with adequate rest periods Outcome: Progressing   Problem: Respiratory: Goal: Ability to maintain a clear airway will improve Outcome: Progressing Goal: Levels of oxygenation will improve Outcome: Progressing Goal: Ability to maintain adequate ventilation will improve Outcome: Progressing   Problem: Education: Goal: Knowledge of disease or condition will improve Outcome: Progressing Goal: Understanding of medication regimen will improve Outcome: Progressing Goal: Individualized Educational Video(s) Outcome: Progressing   Problem: Activity: Goal: Ability to tolerate increased activity will improve Outcome: Progressing   Problem: Cardiac: Goal: Ability to achieve and  maintain adequate cardiopulmonary perfusion will improve Outcome: Progressing   Problem: Health Behavior/Discharge Planning: Goal: Ability to safely manage health-related needs after discharge will improve Outcome: Progressing

## 2023-08-15 NOTE — Plan of Care (Signed)
  Problem: Education: Goal: Ability to describe self-care measures that may prevent or decrease complications (Diabetes Survival Skills Education) will improve Outcome: Progressing Goal: Individualized Educational Video(s) Outcome: Progressing   Problem: Fluid Volume: Goal: Ability to maintain a balanced intake and output will improve Outcome: Progressing   Problem: Health Behavior/Discharge Planning: Goal: Ability to identify and utilize available resources and services will improve Outcome: Progressing Goal: Ability to manage health-related needs will improve Outcome: Progressing   Problem: Metabolic: Goal: Ability to maintain appropriate glucose levels will improve Outcome: Progressing   Problem: Nutritional: Goal: Maintenance of adequate nutrition will improve Outcome: Progressing Goal: Progress toward achieving an optimal weight will improve Outcome: Progressing

## 2023-08-27 ENCOUNTER — Ambulatory Visit: Attending: Physician Assistant | Admitting: Physician Assistant

## 2024-01-07 ENCOUNTER — Encounter (HOSPITAL_COMMUNITY): Payer: Self-pay

## 2024-01-07 ENCOUNTER — Emergency Department (HOSPITAL_COMMUNITY)

## 2024-01-07 ENCOUNTER — Inpatient Hospital Stay (HOSPITAL_COMMUNITY)
Admission: EM | Admit: 2024-01-07 | Discharge: 2024-01-18 | DRG: 193 | Disposition: A | Discharge status: Skilled Nursing Facility | Source: Skilled Nursing Facility | Attending: Internal Medicine | Admitting: Internal Medicine

## 2024-01-07 ENCOUNTER — Inpatient Hospital Stay (HOSPITAL_COMMUNITY)

## 2024-01-07 DIAGNOSIS — Z89612 Acquired absence of left leg above knee: Secondary | ICD-10-CM

## 2024-01-07 DIAGNOSIS — E1165 Type 2 diabetes mellitus with hyperglycemia: Secondary | ICD-10-CM | POA: Diagnosis not present

## 2024-01-07 DIAGNOSIS — J189 Pneumonia, unspecified organism: Principal | ICD-10-CM | POA: Diagnosis present

## 2024-01-07 DIAGNOSIS — R400 Somnolence: Secondary | ICD-10-CM | POA: Diagnosis present

## 2024-01-07 DIAGNOSIS — E669 Obesity, unspecified: Secondary | ICD-10-CM | POA: Diagnosis present

## 2024-01-07 DIAGNOSIS — E1169 Type 2 diabetes mellitus with other specified complication: Secondary | ICD-10-CM | POA: Diagnosis present

## 2024-01-07 DIAGNOSIS — Y95 Nosocomial condition: Secondary | ICD-10-CM | POA: Diagnosis present

## 2024-01-07 DIAGNOSIS — I5A Non-ischemic myocardial injury (non-traumatic): Secondary | ICD-10-CM | POA: Diagnosis present

## 2024-01-07 DIAGNOSIS — Z7952 Long term (current) use of systemic steroids: Secondary | ICD-10-CM

## 2024-01-07 DIAGNOSIS — G9340 Encephalopathy, unspecified: Secondary | ICD-10-CM | POA: Diagnosis present

## 2024-01-07 DIAGNOSIS — I480 Paroxysmal atrial fibrillation: Secondary | ICD-10-CM | POA: Diagnosis present

## 2024-01-07 DIAGNOSIS — I509 Heart failure, unspecified: Secondary | ICD-10-CM | POA: Diagnosis not present

## 2024-01-07 DIAGNOSIS — Z888 Allergy status to other drugs, medicaments and biological substances status: Secondary | ICD-10-CM

## 2024-01-07 DIAGNOSIS — E785 Hyperlipidemia, unspecified: Secondary | ICD-10-CM | POA: Diagnosis present

## 2024-01-07 DIAGNOSIS — I5031 Acute diastolic (congestive) heart failure: Secondary | ICD-10-CM

## 2024-01-07 DIAGNOSIS — I5033 Acute on chronic diastolic (congestive) heart failure: Secondary | ICD-10-CM | POA: Diagnosis present

## 2024-01-07 DIAGNOSIS — R54 Age-related physical debility: Secondary | ICD-10-CM | POA: Diagnosis present

## 2024-01-07 DIAGNOSIS — I5040 Unspecified combined systolic (congestive) and diastolic (congestive) heart failure: Secondary | ICD-10-CM | POA: Diagnosis not present

## 2024-01-07 DIAGNOSIS — J9621 Acute and chronic respiratory failure with hypoxia: Principal | ICD-10-CM | POA: Diagnosis present

## 2024-01-07 DIAGNOSIS — Z7901 Long term (current) use of anticoagulants: Secondary | ICD-10-CM | POA: Diagnosis not present

## 2024-01-07 DIAGNOSIS — Z66 Do not resuscitate: Secondary | ICD-10-CM | POA: Diagnosis present

## 2024-01-07 DIAGNOSIS — J439 Emphysema, unspecified: Secondary | ICD-10-CM | POA: Diagnosis not present

## 2024-01-07 DIAGNOSIS — G3184 Mild cognitive impairment, so stated: Secondary | ICD-10-CM | POA: Diagnosis not present

## 2024-01-07 DIAGNOSIS — N179 Acute kidney failure, unspecified: Secondary | ICD-10-CM | POA: Diagnosis present

## 2024-01-07 DIAGNOSIS — J9601 Acute respiratory failure with hypoxia: Principal | ICD-10-CM | POA: Diagnosis present

## 2024-01-07 DIAGNOSIS — I5043 Acute on chronic combined systolic (congestive) and diastolic (congestive) heart failure: Secondary | ICD-10-CM | POA: Diagnosis present

## 2024-01-07 DIAGNOSIS — Z79899 Other long term (current) drug therapy: Secondary | ICD-10-CM

## 2024-01-07 DIAGNOSIS — G8929 Other chronic pain: Secondary | ICD-10-CM | POA: Diagnosis present

## 2024-01-07 DIAGNOSIS — I2510 Atherosclerotic heart disease of native coronary artery without angina pectoris: Secondary | ICD-10-CM | POA: Diagnosis present

## 2024-01-07 DIAGNOSIS — I4892 Unspecified atrial flutter: Secondary | ICD-10-CM | POA: Diagnosis present

## 2024-01-07 DIAGNOSIS — G4700 Insomnia, unspecified: Secondary | ICD-10-CM | POA: Diagnosis present

## 2024-01-07 DIAGNOSIS — I110 Hypertensive heart disease with heart failure: Secondary | ICD-10-CM | POA: Diagnosis present

## 2024-01-07 DIAGNOSIS — I252 Old myocardial infarction: Secondary | ICD-10-CM | POA: Diagnosis not present

## 2024-01-07 DIAGNOSIS — Z818 Family history of other mental and behavioral disorders: Secondary | ICD-10-CM

## 2024-01-07 DIAGNOSIS — I10 Essential (primary) hypertension: Secondary | ICD-10-CM | POA: Diagnosis not present

## 2024-01-07 DIAGNOSIS — J440 Chronic obstructive pulmonary disease with acute lower respiratory infection: Secondary | ICD-10-CM | POA: Diagnosis present

## 2024-01-07 DIAGNOSIS — J441 Chronic obstructive pulmonary disease with (acute) exacerbation: Secondary | ICD-10-CM | POA: Diagnosis present

## 2024-01-07 DIAGNOSIS — Z880 Allergy status to penicillin: Secondary | ICD-10-CM

## 2024-01-07 DIAGNOSIS — Z86718 Personal history of other venous thrombosis and embolism: Secondary | ICD-10-CM

## 2024-01-07 DIAGNOSIS — E1151 Type 2 diabetes mellitus with diabetic peripheral angiopathy without gangrene: Secondary | ICD-10-CM | POA: Diagnosis present

## 2024-01-07 DIAGNOSIS — J438 Other emphysema: Secondary | ICD-10-CM | POA: Diagnosis present

## 2024-01-07 DIAGNOSIS — Z7984 Long term (current) use of oral hypoglycemic drugs: Secondary | ICD-10-CM

## 2024-01-07 DIAGNOSIS — I4891 Unspecified atrial fibrillation: Secondary | ICD-10-CM | POA: Diagnosis not present

## 2024-01-07 DIAGNOSIS — J849 Interstitial pulmonary disease, unspecified: Secondary | ICD-10-CM | POA: Diagnosis present

## 2024-01-07 DIAGNOSIS — F319 Bipolar disorder, unspecified: Secondary | ICD-10-CM | POA: Diagnosis present

## 2024-01-07 DIAGNOSIS — Z1152 Encounter for screening for COVID-19: Secondary | ICD-10-CM | POA: Diagnosis not present

## 2024-01-07 DIAGNOSIS — T380X5A Adverse effect of glucocorticoids and synthetic analogues, initial encounter: Secondary | ICD-10-CM | POA: Diagnosis present

## 2024-01-07 DIAGNOSIS — Z7951 Long term (current) use of inhaled steroids: Secondary | ICD-10-CM

## 2024-01-07 DIAGNOSIS — Z87891 Personal history of nicotine dependence: Secondary | ICD-10-CM

## 2024-01-07 DIAGNOSIS — Z96641 Presence of right artificial hip joint: Secondary | ICD-10-CM | POA: Diagnosis present

## 2024-01-07 DIAGNOSIS — U099 Post covid-19 condition, unspecified: Secondary | ICD-10-CM | POA: Diagnosis present

## 2024-01-07 DIAGNOSIS — Z955 Presence of coronary angioplasty implant and graft: Secondary | ICD-10-CM

## 2024-01-07 DIAGNOSIS — K8020 Calculus of gallbladder without cholecystitis without obstruction: Secondary | ICD-10-CM | POA: Diagnosis present

## 2024-01-07 DIAGNOSIS — Z8249 Family history of ischemic heart disease and other diseases of the circulatory system: Secondary | ICD-10-CM

## 2024-01-07 DIAGNOSIS — I739 Peripheral vascular disease, unspecified: Secondary | ICD-10-CM | POA: Diagnosis not present

## 2024-01-07 LAB — URINALYSIS, W/ REFLEX TO CULTURE (INFECTION SUSPECTED)
Bilirubin Urine: NEGATIVE
Glucose, UA: >=500 mg/dL — AB
Hgb urine dipstick: NEGATIVE
Ketones, ur: NEGATIVE mg/dL
Leukocytes,Ua: NEGATIVE
Nitrite: NEGATIVE
Protein, ur: NEGATIVE mg/dL
Specific Gravity, Urine: 1.009 (ref 1.005–1.030)
pH: 5 (ref 5.0–8.0)

## 2024-01-07 LAB — ECHOCARDIOGRAM COMPLETE
AR max vel: 1.67 cm2
AV Area VTI: 2.23 cm2
AV Area mean vel: 1.86 cm2
AV Mean grad: 3 mmHg
AV Peak grad: 8.3 mmHg
Ao pk vel: 1.44 m/s
Area-P 1/2: 2.32 cm2
Calc EF: 53.8 %
S' Lateral: 3.9 cm
Single Plane A2C EF: 51.8 %
Single Plane A4C EF: 55.1 %

## 2024-01-07 LAB — CBC WITH DIFFERENTIAL/PLATELET
Abs Immature Granulocytes: 0.05 K/uL (ref 0.00–0.07)
Basophils Absolute: 0.1 K/uL (ref 0.0–0.1)
Basophils Relative: 1 %
Eosinophils Absolute: 0.1 K/uL (ref 0.0–0.5)
Eosinophils Relative: 1 %
HCT: 54.6 % — ABNORMAL HIGH (ref 39.0–52.0)
Hemoglobin: 16.3 g/dL (ref 13.0–17.0)
Immature Granulocytes: 1 %
Lymphocytes Relative: 19 %
Lymphs Abs: 1.5 K/uL (ref 0.7–4.0)
MCH: 26.9 pg (ref 26.0–34.0)
MCHC: 29.9 g/dL — ABNORMAL LOW (ref 30.0–36.0)
MCV: 90.2 fL (ref 80.0–100.0)
Monocytes Absolute: 0.7 K/uL (ref 0.1–1.0)
Monocytes Relative: 9 %
Neutro Abs: 5.5 K/uL (ref 1.7–7.7)
Neutrophils Relative %: 69 %
Platelets: 234 K/uL (ref 150–400)
RBC: 6.05 MIL/uL — ABNORMAL HIGH (ref 4.22–5.81)
RDW: 17 % — ABNORMAL HIGH (ref 11.5–15.5)
WBC: 8 K/uL (ref 4.0–10.5)
nRBC: 0 % (ref 0.0–0.2)

## 2024-01-07 LAB — COMPREHENSIVE METABOLIC PANEL WITH GFR
ALT: 10 U/L (ref 0–44)
AST: 13 U/L — ABNORMAL LOW (ref 15–41)
Albumin: 4 g/dL (ref 3.5–5.0)
Alkaline Phosphatase: 62 U/L (ref 38–126)
Anion gap: 10 (ref 5–15)
BUN: 17 mg/dL (ref 8–23)
CO2: 26 mmol/L (ref 22–32)
Calcium: 9.3 mg/dL (ref 8.9–10.3)
Chloride: 104 mmol/L (ref 98–111)
Creatinine, Ser: 1.05 mg/dL (ref 0.61–1.24)
GFR, Estimated: >60 mL/min
Glucose, Bld: 236 mg/dL — ABNORMAL HIGH (ref 70–99)
Potassium: 4.4 mmol/L (ref 3.5–5.1)
Sodium: 140 mmol/L (ref 135–145)
Total Bilirubin: 0.3 mg/dL (ref 0.0–1.2)
Total Protein: 7 g/dL (ref 6.5–8.1)

## 2024-01-07 LAB — RESP PANEL BY RT-PCR (RSV, FLU A&B, COVID)  RVPGX2
Influenza A by PCR: NEGATIVE
Influenza B by PCR: NEGATIVE
Resp Syncytial Virus by PCR: NEGATIVE
SARS Coronavirus 2 by RT PCR: NEGATIVE

## 2024-01-07 LAB — BLOOD GAS, VENOUS
Acid-Base Excess: 4.5 mmol/L — ABNORMAL HIGH (ref 0.0–2.0)
Bicarbonate: 31.2 mmol/L — ABNORMAL HIGH (ref 20.0–28.0)
O2 Saturation: 76.2 %
Patient temperature: 37
pCO2, Ven: 54 mmHg (ref 44–60)
pH, Ven: 7.37 (ref 7.25–7.43)
pO2, Ven: 48 mmHg — ABNORMAL HIGH (ref 32–45)

## 2024-01-07 LAB — URINE DRUG SCREEN
Amphetamines: NEGATIVE
Barbiturates: NEGATIVE
Benzodiazepines: NEGATIVE
Cocaine: NEGATIVE
Fentanyl: NEGATIVE
Methadone Scn, Ur: NEGATIVE
Opiates: NEGATIVE
Tetrahydrocannabinol: NEGATIVE

## 2024-01-07 LAB — CBG MONITORING, ED
Glucose-Capillary: 152 mg/dL — ABNORMAL HIGH (ref 70–99)
Glucose-Capillary: 143 mg/dL — ABNORMAL HIGH (ref 70–99)

## 2024-01-07 LAB — LIPASE, BLOOD: Lipase: 31 U/L (ref 11–51)

## 2024-01-07 LAB — ETHANOL: Alcohol, Ethyl (B): <15 mg/dL

## 2024-01-07 LAB — TROPONIN T, HIGH SENSITIVITY
Troponin T High Sensitivity: 34 ng/L — ABNORMAL HIGH (ref 0–19)
Troponin T High Sensitivity: 32 ng/L — ABNORMAL HIGH (ref 0–19)

## 2024-01-07 LAB — PRO BRAIN NATRIURETIC PEPTIDE: Pro Brain Natriuretic Peptide: 740 pg/mL — ABNORMAL HIGH

## 2024-01-07 MED ORDER — GABAPENTIN 100 MG PO CAPS
100.0000 mg | ORAL_CAPSULE | Freq: Two times a day (BID) | ORAL | Status: DC
  Administered 2024-01-07 – 2024-01-18 (×22): 100 mg via ORAL
  Filled 2024-01-07 (×23): qty 1

## 2024-01-07 MED ORDER — ATORVASTATIN CALCIUM 40 MG PO TABS
40.0000 mg | ORAL_TABLET | Freq: Every day | ORAL | Status: DC
  Administered 2024-01-08 – 2024-01-18 (×11): 40 mg via ORAL
  Filled 2024-01-07 (×12): qty 1

## 2024-01-07 MED ORDER — APIXABAN 5 MG PO TABS
5.0000 mg | ORAL_TABLET | Freq: Two times a day (BID) | ORAL | Status: DC
  Administered 2024-01-07 – 2024-01-18 (×22): 5 mg via ORAL
  Filled 2024-01-07 (×23): qty 1

## 2024-01-07 MED ORDER — SODIUM CHLORIDE 0.9 % IV SOLN
500.0000 mg | Freq: Once | INTRAVENOUS | Status: AC
  Administered 2024-01-07: 500 mg via INTRAVENOUS
  Filled 2024-01-07: qty 5

## 2024-01-07 MED ORDER — ACETAMINOPHEN 325 MG PO TABS
650.0000 mg | ORAL_TABLET | Freq: Four times a day (QID) | ORAL | Status: DC | PRN
  Administered 2024-01-11: 650 mg via ORAL
  Filled 2024-01-07 (×2): qty 2

## 2024-01-07 MED ORDER — ENOXAPARIN SODIUM 40 MG/0.4ML IJ SOSY: 40.0000 mg | PREFILLED_SYRINGE | INTRAMUSCULAR | Status: DC

## 2024-01-07 MED ORDER — ESCITALOPRAM OXALATE 10 MG PO TABS
10.0000 mg | ORAL_TABLET | Freq: Every day | ORAL | Status: DC
  Administered 2024-01-07 – 2024-01-17 (×11): 10 mg via ORAL
  Filled 2024-01-07 (×11): qty 1

## 2024-01-07 MED ORDER — OXYCODONE HCL 5 MG PO TABS
5.0000 mg | ORAL_TABLET | ORAL | Status: DC | PRN
  Administered 2024-01-08: 5 mg via ORAL
  Filled 2024-01-07: qty 1

## 2024-01-07 MED ORDER — TRAZODONE HCL 100 MG PO TABS
200.0000 mg | ORAL_TABLET | Freq: Every day | ORAL | Status: DC
  Administered 2024-01-07 – 2024-01-17 (×11): 200 mg via ORAL
  Filled 2024-01-07 (×11): qty 2

## 2024-01-07 MED ORDER — ACETAMINOPHEN 650 MG RE SUPP: 650.0000 mg | Freq: Four times a day (QID) | RECTAL | Status: DC | PRN

## 2024-01-07 MED ORDER — TRAZODONE HCL 50 MG PO TABS: 25.0000 mg | ORAL_TABLET | Freq: Every day | ORAL | Status: DC

## 2024-01-07 MED ORDER — ONDANSETRON HCL 4 MG/2ML IJ SOLN
4.0000 mg | Freq: Once | INTRAMUSCULAR | Status: AC
  Administered 2024-01-07: 4 mg via INTRAVENOUS
  Filled 2024-01-07: qty 2

## 2024-01-07 MED ORDER — SODIUM CHLORIDE 0.9 % IV SOLN
1.0000 g | Freq: Once | INTRAVENOUS | Status: AC
  Administered 2024-01-07: 1 g via INTRAVENOUS
  Filled 2024-01-07: qty 10

## 2024-01-07 MED ORDER — IPRATROPIUM-ALBUTEROL 0.5-2.5 (3) MG/3ML IN SOLN
3.0000 mL | Freq: Three times a day (TID) | RESPIRATORY_TRACT | Status: DC
  Administered 2024-01-07 – 2024-01-08 (×5): 3 mL via RESPIRATORY_TRACT
  Filled 2024-01-07 (×6): qty 3

## 2024-01-07 MED ORDER — INSULIN ASPART 100 UNIT/ML IJ SOLN
0.0000 [IU] | Freq: Three times a day (TID) | INTRAMUSCULAR | Status: DC
  Administered 2024-01-07: 2 [IU] via SUBCUTANEOUS
  Administered 2024-01-08: 1 [IU] via SUBCUTANEOUS
  Administered 2024-01-08: 2 [IU] via SUBCUTANEOUS
  Administered 2024-01-08: 1 [IU] via SUBCUTANEOUS
  Administered 2024-01-09: 3 [IU] via SUBCUTANEOUS
  Administered 2024-01-09: 2 [IU] via SUBCUTANEOUS
  Administered 2024-01-09: 7 [IU] via SUBCUTANEOUS
  Administered 2024-01-10: 3 [IU] via SUBCUTANEOUS
  Administered 2024-01-10: 7 [IU] via SUBCUTANEOUS
  Administered 2024-01-10 – 2024-01-11 (×4): 5 [IU] via SUBCUTANEOUS
  Administered 2024-01-12: 1 [IU] via SUBCUTANEOUS
  Administered 2024-01-12: 5 [IU] via SUBCUTANEOUS
  Administered 2024-01-12: 9 [IU] via SUBCUTANEOUS
  Administered 2024-01-13 (×2): 3 [IU] via SUBCUTANEOUS
  Filled 2024-01-07: qty 7
  Filled 2024-01-07: qty 3
  Filled 2024-01-07: qty 5
  Filled 2024-01-07 (×2): qty 1
  Filled 2024-01-07: qty 2
  Filled 2024-01-07: qty 3
  Filled 2024-01-07: qty 9
  Filled 2024-01-07 (×2): qty 5
  Filled 2024-01-07: qty 3
  Filled 2024-01-07: qty 2
  Filled 2024-01-07: qty 7
  Filled 2024-01-07: qty 3
  Filled 2024-01-07: qty 5
  Filled 2024-01-07: qty 1
  Filled 2024-01-07: qty 5

## 2024-01-07 MED ORDER — FUROSEMIDE 10 MG/ML IJ SOLN
40.0000 mg | Freq: Two times a day (BID) | INTRAMUSCULAR | Status: DC
  Administered 2024-01-07 – 2024-01-09 (×5): 40 mg via INTRAVENOUS
  Filled 2024-01-07 (×6): qty 4

## 2024-01-07 MED ORDER — FUROSEMIDE 10 MG/ML IJ SOLN
40.0000 mg | Freq: Once | INTRAMUSCULAR | Status: AC
  Administered 2024-01-07: 40 mg via INTRAVENOUS
  Filled 2024-01-07: qty 4

## 2024-01-07 NOTE — ED Notes (Signed)
 Emptied cannister; 700 ml

## 2024-01-07 NOTE — ED Triage Notes (Signed)
 Pt BIB [facility] from [facility] for increased SOB and altered mental status x 1 day. Pt's O2 sat is 88% on 3L of O2 via nasal cannula. Pt is alert and oriented x 1 only

## 2024-01-07 NOTE — ED Notes (Signed)
 Pt removed BiPAP at this time. Oxygen saturation remain 95% and above on room air. Respirations even and unlabored. No distress noted at this time.

## 2024-01-07 NOTE — ED Notes (Signed)
 Placed condom cath on pt to collect urine sample

## 2024-01-07 NOTE — ED Notes (Signed)
 Spoke with pt daughter Cena. Provided updates regarding care of father.

## 2024-01-07 NOTE — H&P (Signed)
 " History and Physical    Anthony [name]. FMW:[id number] DOB: 1950-05-04 DOA: 01/07/2024  PCP: No primary care provider on file.   Chief Complaint: SOB  HPI: Anthony [name]. is a 73 y.o. male with medical history significant of COPD, CAD, A-fib on Eliquis  who presents emergency room with shortness of breath and confusion.  Patient is been having progressive worsening shortness of breath last 2 days and was found to be hypoxic satting in the low 80s.  He was placed on nasal cannula and was transferred to the ER for further assessment.  On arrival he was confused and only oriented to self.  He remained hypoxic and was placed on BiPAP.  Labs were obtained which showed respiratory viral panel negative, CMP unrevealing, BMP showed elevation 740, troponin 34, 32.  Blood gas was stable.  Patient underwent chest x-ray which showed interstitial edema and pleural effusions.  Patient last had echocardiogram in July which showed EF normal although recommended repeat imaging due to difficult windows.  Patient was given IV diuresis and mated for further workup   Review of Systems: Review of Systems  Constitutional: Negative.  Negative for chills and fever.  HENT: Negative.    Eyes: Negative.   Respiratory: Negative.    Cardiovascular: Negative.   Gastrointestinal: Negative.   Genitourinary: Negative.   Musculoskeletal: Negative.   Skin: Negative.   Neurological: Negative.   Endo/Heme/Allergies: Negative.   All other systems reviewed and are negative.    As per HPI otherwise 10 point review of systems negative.   Allergies[1]  Past Medical History:  Diagnosis Date   Anxiety    Arthritis    Atrial fibrillation (HCC)    Atrial flutter (HCC)    Bipolar disorder (HCC)    CAD (coronary artery disease)    a. INF STEMI 07/04/10:  tx with thrombectomy + [facility] to [facility];  cath 07/04/10: dLM 10-20%, pLAD 40-50%, mLAD 20-30%, pRCA 30%, mRCA occluded and tx with PCI, EF 50% with inf HK. A Multilink    COPD (chronic obstructive pulmonary disease) (HCC)    Diabetes mellitus (HCC)    History of DVT (deep vein thrombosis)    HLD (hyperlipidemia)    Hypertension    Left above-knee amputee (HCC)    PAD (peripheral artery disease)    Polysubstance abuse (HCC)    Prior hx   Tobacco abuse    Urinary frequency    Vitamin D  deficiency 09/05/2018    Past Surgical History:  Procedure Laterality Date   CARDIAC CATHETERIZATION     COLONOSCOPY N/A 12/29/2015   Procedure: COLONOSCOPY;  Surgeon: Claudis RAYMOND Rivet, MD;  Location: [facility];  Service: Endoscopy;  Laterality: N/A;  12:55   ELECTROCARDIOGRAM     Showed inferolateral ST-elevation and a code STEMI was activated. In the ER, he was treated with morphine , herarin, and 600 mg of Plavix. He was transferred emergently to [facility].    INGUINAL HERNIA REPAIR Left 07/26/2014   Procedure: OPEN REPAIR OF RECURENT LEFT INGUINAL HERNIA REPAIR WITH MESH;  Surgeon: Camellia Blush, MD;  Location: [facility];  Service: General;  Laterality: Left;   INSERTION OF MESH Bilateral 10/30/2013   Procedure: INSERTION OF MESH;  Surgeon: Camellia CHRISTELLA Blush, MD;  Location: [facility];  Service: General;  Laterality: Bilateral;   LAPAROSCOPIC INGUINAL HERNIA WITH UMBILICAL HERNIA Bilateral 10/30/2013   Procedure: laparoscopic repair left pantaloom hernia with mesh, laparoscopic right inguinal hernia with mesh, OPEN REPAIR OF UMBILICAL  HERNIA ;  Surgeon: Camellia CHRISTELLA Blush, MD;  Location: [facility];  Service: General;  Laterality: Bilateral;   LAPAROSCOPY N/A 07/26/2014   Procedure: LAPAROSCOPY DIAGNOSTIC;  Surgeon: Camellia Blush, MD;  Location: [facility];  Service: General;  Laterality: N/A;   POLYPECTOMY  12/29/2015   Procedure: POLYPECTOMY;  Surgeon: Claudis RAYMOND Rivet, MD;  Location: [facility];  Service: Endoscopy;;  ascending colon, descending colon   stent placement     TOTAL HIP ARTHROPLASTY Right 05/10/2015   Procedure: RIGHT TOTAL HIP ARTHROPLASTY ANTERIOR APPROACH;   Surgeon: Donnice Car, MD;  Location: [facility];  Service: Orthopedics;  Laterality: Right;   VASECTOMY       reports that he has quit smoking. His smoking use included cigarettes. He has a 67.5 pack-year smoking history. He has never used smokeless tobacco. He reports that he does not drink alcohol  and does not use drugs.  Family History  Problem Relation Age of Onset   Coronary artery disease Father    Depression Father    Heart attack Father    Depression Mother     Prior to Admission medications  Medication Sig Start Date End Date Taking? Authorizing Provider  acetaminophen  (TYLENOL ) 500 MG tablet Take 1,000 mg by mouth in the morning, at noon, and at bedtime.    [provider]  albuterol  (VENTOLIN  HFA) 108 (90 Base) MCG/ACT inhaler Inhale 2 puffs into the lungs in the morning, at noon, and at bedtime. Patient not taking: Reported on 07/26/2023    [provider]  apixaban  (ELIQUIS ) 5 MG TABS tablet Take 1 tablet (5 mg total) by mouth 2 (two) times daily. 03/03/21   [name], MD  atorvastatin  (LIPITOR) 40 MG tablet Take 1 tablet (40 mg total) by mouth daily. 12/22/18   Alexander, Natalie, DO  Baclofen  5 MG TABS Take 5 mg by mouth in the morning and at bedtime.    [provider]  blood glucose meter kit and supplies KIT Dispense based on patient and insurance preference. Use to check BG once daily or every other other.  Dx: E11.9 07/17/16   Lavell Bari LABOR, FNP  cyanocobalamin  1000 MCG tablet Take 1 tablet (1,000 mcg total) by mouth daily. 08/16/23   Arrien, Elidia Sieving, MD  diphenhydrAMINE -Phenylephrine  12.5-5 MG/5ML SOLN Take 10 mLs by mouth in the morning and at bedtime.    [provider]  escitalopram  (LEXAPRO ) 10 MG tablet Take 1 tablet (10 mg total) by mouth at bedtime. 03/03/21   [name], MD  feeding supplement, GLUCERNA SHAKE, (GLUCERNA SHAKE) LIQD Take 237 mLs by mouth 3 (three) times daily between meals. 08/15/23    Arrien, Elidia Sieving, MD  ferrous sulfate  325 (65 FE) MG tablet Take 1 tablet (325 mg total) by mouth daily with breakfast. 08/16/23   Arrien, Elidia Sieving, MD  Fluticasone -Umeclidin-Vilant (TRELEGY ELLIPTA ) 200-62.5-25 MCG/ACT AEPB Inhale 1 puff into the lungs daily. 08/15/23   Arrien, Elidia Sieving, MD  furosemide  (LASIX ) 20 MG tablet Take 1 tablet (20 mg total) by mouth daily. 08/16/23   Arrien, Mauricio Daniel, MD  gabapentin  (NEURONTIN ) 100 MG capsule Take 100 mg by mouth 2 (two) times daily.    [provider]  glipiZIDE  (GLUCOTROL ) 10 MG tablet Take 1 tablet (10 mg total) by mouth daily before breakfast. 08/15/23   Arrien, Elidia Sieving, MD  guaiFENesin  (MUCINEX ) 600 MG 12 hr tablet Take 600 mg by mouth 2 (two) times daily.    [provider]  ipratropium-albuterol  (  DUONEB) 0.5-2.5 (3) MG/3ML SOLN Take 3 mLs by nebulization 3 (three) times daily.    [provider]  Melatonin 10 MG TABS Take 10 mg by mouth at bedtime.    [provider]  metFORMIN  (GLUCOPHAGE -XR) 500 MG 24 hr tablet TAKE 2 TABLETS BY MOUTH  DAILY WITH BREAKFAST Patient taking differently: Take 1,000 mg by mouth in the morning and at bedtime. 01/18/20   [name], DO  nystatin ointment (MYCOSTATIN) Apply 1 Application topically 2 (two) times daily. Apply thin layer to groin area    [provider]  Olopatadine  HCl (PATADAY ) 0.2 % SOLN Place 1 drop into both eyes daily.    [provider]  Polyethyl Glycol-Propyl Glycol (SYSTANE) 0.4-0.3 % SOLN Place 1 drop into both eyes as needed (for dryness and irritation).    [provider]  polyethylene glycol (MIRALAX  / GLYCOLAX ) 17 g packet Take 17 g by mouth every other day.    [provider]  potassium chloride  (KLOR-CON  M) 10 MEQ tablet Take 1 tablet (10 mEq total) by mouth daily. 08/15/23   Arrien, Elidia Sieving, MD  Semaglutide  7 MG TABS Take 7 mg by mouth daily.    [provider]  sodium  chloride (OCEAN) 0.65 % nasal spray Place 1 spray into the nose 3 (three) times daily.    [provider]  traZODone  (DESYREL ) 100 MG tablet Take 200 mg by mouth at bedtime. 12/11/21   [provider]  traZODone  (DESYREL ) 50 MG tablet Take 25 mg by mouth at bedtime. Take along with 200mg  for a dose of 225mg  nightly 07/08/23   [provider]    Physical Exam: Vitals:   01/07/24 0323 01/07/24 0332 01/07/24 0445 01/07/24 0500  BP:  116/64 (!) 106/58 107/60  Pulse: (!) 50 (!) 47 (!) 57 (!) 56  Resp: 19 13 18 18   Temp:  97.6 F (36.4 C)    TempSrc:  Oral    SpO2: 90% 97% 95% 96%   Physical Exam Constitutional:      Appearance: He is normal weight.  HENT:     Head: Normocephalic.     Mouth/Throat:     Mouth: Mucous membranes are moist.  Eyes:     Pupils: Pupils are equal, round, and reactive to light.  Cardiovascular:     Rate and Rhythm: Normal rate and regular rhythm.  Pulmonary:     Effort: Pulmonary effort is normal.     Breath sounds: Normal breath sounds.  Abdominal:     Palpations: Abdomen is soft.  Musculoskeletal:        General: Normal range of motion.     Cervical back: Normal range of motion.  Skin:    Capillary Refill: Capillary refill takes less than 2 seconds.  Neurological:     General: No focal deficit present.     Mental Status: He is alert.  Psychiatric:        Mood and Affect: Mood normal.       Labs on Admission: I have personally reviewed the patients's labs and imaging studies.  Assessment/Plan Principal Problem:   CHF exacerbation (HCC)   # Acute hypoxic respiratory failure most likely secondary to CHF exacerbation - Last echocardiogram was nondiagnostic - Patient appears volume overloaded with crackles and fluid on chest imaging - Hypoxic requiring BiPAP  Plan: Continue IV diuresis Repeat echocardiogram Check lab in morning Patient given IV antibiotics in emergency department however source of infection not  identified  # Paroxysmal  A-fib-continue Eliquis   # Hyperlipidemia-continue Lipitor  # Depression-continue Lexapro   # Chronic pain-continue gabapentin   # Insomnia-continue trazodone    Admission status: Inpatient Progressive  Certification: The appropriate patient status for this patient is INPATIENT. Inpatient status is judged to be reasonable and necessary in order to provide the required intensity of service to ensure the patient's safety. The patient's presenting symptoms, physical exam findings, and initial radiographic and laboratory data in the context of their chronic comorbidities is felt to place them at high risk for further clinical deterioration. Furthermore, it is not anticipated that the patient will be medically stable for discharge from the hospital within 2 midnights of admission.   * I certify that at the point of admission it is my clinical judgment that the patient will require inpatient hospital care spanning beyond 2 midnights from the point of admission due to high intensity of service, high risk for further deterioration and high frequency of surveillance required.DEWAINE Lamar Dess MD [facility] If 7PM-7AM, please contact night-coverage [email or web address]  01/07/2024, 5:25 AM        [1]  Allergies Allergen Reactions   Flomax [Tamsulosin Hcl] Other (See Comments)    This medication makes patient feel sick   Penicillins Other (See Comments)    Reaction unknown occurred during childhood Has patient had a PCN reaction causing immediate rash, facial/tongue/throat swelling, SOB or lightheadedness with hypotension: unsure - childhood reaction Has patient had a PCN reaction causing severe rash involving mucus membranes or skin necrosis: unsure - childhood reaction Has patient had a PCN reaction that required hospitalization no Has patient had a PCN reaction occurring within the last 10 years: no If all of the above answers are NO, then may p   "

## 2024-01-07 NOTE — Plan of Care (Signed)
 Courtesy visit- No billing:  Patient is seen and examined today morning in ED. He is lying in bed with Bipap, unable to provide history. Patient is admitted for CHF exacerbation in PCU for close monitoring.  Plan: Patient will be continued on IV Lasix  40 twice daily.  Continue to monitor daily weights, strict input and output.  Continue to wean off BiPAP as tolerated.  Echocardiogram pending.  Will get cardiology consultation.  Continue patient's home medications.  Patient is started on cardiac, carb consistent diet, insulin  regimen ordered.  Further management as per clinical course.

## 2024-01-07 NOTE — ED Provider Notes (Signed)
 "  Emergency Department Provider Note   I have reviewed the triage vital signs and the nursing notes.   HISTORY  Chief Complaint Shortness of Breath and Altered Mental Status   HPI Anthony [name]. is a 73 y.o. male with a past history of COPD, CAD, A-fib on Eliquis  presents to the emergency department with shortness of breath and confusion.  He arrives by EMS transported from [facility] and rehab.  They have noticed increased shortness of breath and confusion for the past 24 hours.  Initially found to be saturating in the low 80s and placed on 3 L nasal cannula.  He is unsure why he is here.  He tells me it is 2022 and that he is in the hospital.  Unsure who called EMS.  Denies any pain. Level 5 caveat: confusion.   Past Medical History:  Diagnosis Date   Anxiety    Arthritis    Atrial fibrillation (HCC)    Atrial flutter (HCC)    Bipolar disorder (HCC)    CAD (coronary artery disease)    a. INF STEMI 07/04/10:  tx with thrombectomy + [facility] to [facility];  cath 07/04/10: dLM 10-20%, pLAD 40-50%, mLAD 20-30%, pRCA 30%, mRCA occluded and tx with PCI, EF 50% with inf HK. A Multilink   COPD (chronic obstructive pulmonary disease) (HCC)    Diabetes mellitus (HCC)    History of DVT (deep vein thrombosis)    HLD (hyperlipidemia)    Hypertension    Left above-knee amputee (HCC)    PAD (peripheral artery disease)    Polysubstance abuse (HCC)    Prior hx   Tobacco abuse    Urinary frequency    Vitamin D  deficiency 09/05/2018    Review of Systems  Level 5 caveat: AMS ____________________________________________   PHYSICAL EXAM:  VITAL SIGNS: ED Triage Vitals [01/07/24 0016]  Encounter Vitals Group     BP 118/73     Pulse Rate 60     Resp 14     Temp 98 F (36.7 C)     Temp Source Oral     SpO2 (!) 89 %   Constitutional: Drowsy and nodding off but awakens to voice. Eyes: Conjunctivae are normal. PERRL (4 mm).  Head: Atraumatic. Nose: No  congestion/rhinnorhea. Mouth/Throat: Mucous membranes are moist.   Neck: No stridor.  Cardiovascular: Normal rate, regular rhythm. Good peripheral circulation. Grossly normal heart sounds.   Respiratory: Normal respiratory effort.  No retractions. Lungs with rhonchi at the bases.  Gastrointestinal: Soft and nontender. Positive distention.  Musculoskeletal: Left AKA.  Neurologic:  Normal speech and language. Moving extremities equally.  Skin:  Skin is warm, dry and intact. No rash noted.  ____________________________________________   LABS (all labs ordered are listed, but only abnormal results are displayed)  Labs Reviewed  COMPREHENSIVE METABOLIC PANEL WITH GFR - Abnormal; Notable for the following components:      Result Value   Glucose, Bld 236 (*)    AST 13 (*)    All other components within normal limits  PRO BRAIN NATRIURETIC PEPTIDE - Abnormal; Notable for the following components:   Pro Brain Natriuretic Peptide 740.0 (*)    All other components within normal limits  CBC WITH DIFFERENTIAL/PLATELET - Abnormal; Notable for the following components:   RBC 6.05 (*)    HCT 54.6 (*)    MCHC 29.9 (*)    RDW 17.0 (*)    All other components within normal limits  BLOOD GAS, VENOUS -  Abnormal; Notable for the following components:   pO2, Ven 48 (*)    Bicarbonate 31.2 (*)    Acid-Base Excess 4.5 (*)    All other components within normal limits  TROPONIN T, HIGH SENSITIVITY - Abnormal; Notable for the following components:   Troponin T High Sensitivity 34 (*)    All other components within normal limits  TROPONIN T, HIGH SENSITIVITY - Abnormal; Notable for the following components:   Troponin T High Sensitivity 32 (*)    All other components within normal limits  RESP PANEL BY RT-PCR (RSV, FLU A&B, COVID)  RVPGX2  LIPASE, BLOOD  ETHANOL  URINALYSIS, W/ REFLEX TO CULTURE (INFECTION SUSPECTED)  URINE DRUG SCREEN   ____________________________________________  EKG   EKG  Interpretation Date/Time:  Tuesday January 07 2024 00:46:55 EST Ventricular Rate:  65 PR Interval:  214 QRS Duration:  112 QT Interval:  493 QTC Calculation: 513 R Axis:   -77  Text Interpretation: Sinus rhythm Ventricular premature complex Borderline prolonged PR interval Borderline IVCD with LAD Abnormal R-wave progression, late transition Inferior infarct, age indeterminate Abnrm T, probable ischemia, anterolateral lds Prolonged QT interval Baseline wander in lead(s) V2 Confirmed by Darra Chew [phone]) on 01/07/2024 1:00:39 AM        ____________________________________________  RADIOLOGY  DG Chest 2 View Result Date: 01/07/2024 EXAM: 2 VIEW(S) XRAY OF THE CHEST 01/07/2024 12:38:50 AM COMPARISON: 08/11/2023 CLINICAL HISTORY: SOB FINDINGS: LUNGS AND PLEURA: Pulmonary interstitial prominence with mild pulmonary edema. Small left pleural effusion. Left basilar consolidation. No pneumothorax. HEART AND MEDIASTINUM: Cardiomegaly. Calcified aorta. BONES AND SOFT TISSUES: Multilevel degenerative changes of thoracic spine. IMPRESSION: 1. Pulmonary interstitial prominence with mild pulmonary edema. 2. Small left pleural effusion. 3. Left basilar consolidation. 4. Cardiomegaly. Electronically signed by: Franky Stanford MD 01/07/2024 02:43 AM EST RP Workstation: [id number]    ____________________________________________   PROCEDURES  Procedure(s) performed:   Procedures  CRITICAL CARE Performed by: Chew KANDICE Darra Total critical care time: 35 minutes Critical care time was exclusive of separately billable procedures and treating other patients. Critical care was necessary to treat or prevent imminent or life-threatening deterioration. Critical care was time spent personally by me on the following activities: development of treatment plan with patient and/or surrogate as well as nursing, discussions with consultants, evaluation of patient's response to treatment, examination of patient,  obtaining history from patient or surrogate, ordering and performing treatments and interventions, ordering and review of laboratory studies, ordering and review of radiographic studies, pulse oximetry and re-evaluation of patient's condition.  Chew Darra, MD Emergency Medicine  ____________________________________________   INITIAL IMPRESSION / ASSESSMENT AND PLAN / ED COURSE  Pertinent labs & imaging results that were available during my care of the patient were reviewed by me and considered in my medical decision making (see chart for details).   This patient is Presenting for Evaluation of SOB/AMS, which does require a range of treatment options, and is a complaint that involves a high risk of morbidity and mortality.  The Differential Diagnoses includes but is not exclusive to alcohol , illicit or prescription medications, intracranial pathology such as stroke, intracerebral hemorrhage, fever or infectious causes including sepsis, hypoxemia, uremia, trauma, endocrine related disorders such as diabetes, hypoglycemia, thyroid -related diseases, etc.   Critical Interventions-    Medications  cefTRIAXone  (ROCEPHIN ) 1 g in sodium chloride  0.9 % 100 mL IVPB (1 g Intravenous New Bag/Given 01/07/24 0317)  azithromycin  (ZITHROMAX ) 500 mg in sodium chloride  0.9 % 250 mL IVPB (has no administration in time range)  ondansetron  (ZOFRAN ) injection 4 mg (4 mg Intravenous Given 01/07/24 0104)  furosemide  (LASIX ) injection 40 mg (40 mg Intravenous Given 01/07/24 0309)    Reassessment after intervention:  mental status improving.    I did obtain Additional Historical Information from EMS, as the patient is altered. Attempted to call daughter listed as emergency contact but went directly to voicemail.   I decided to review pertinent External Data, and in summary history of COPD.   Clinical Laboratory Tests Ordered, included VBG shows normal CO2.  CBC without leukocytosis or anemia.  Radiologic  Tests Ordered, included CXR. I independently interpreted the images and agree with radiology interpretation.   Cardiac Monitor Tracing which shows NSR.    Social Determinants of Health Risk patient is not an active smoker.   Consult complete with [facility]. Plan for admit.   Medical Decision Making: Summary:  Patient arrives by EMS.  He is drowsy but arouses to voice.  Has new hypoxemia and new O2 requirement.  Denies any chest pain.  Given history I do have some concern for CO2 retention/hypoventilation.  He is maintaining his airway well.  No vomiting.  No significant wheezing on my initial assessment.  Patient transferred to the resuscitation bay and placed on BiPAP with improved sats and mental status.   Reevaluation with update and discussion with patient. Tolerating BiPAP well. Awakens to voice. Reports SOB improved. Improving hypoxemia. Plan for abx and lasix . Suspect at this point that somnolence may have been secondary to hypoxemia in the setting of pulmonary edema and ? CAP.    Patient's presentation is most consistent with acute presentation with potential threat to life or bodily function.   Disposition: admit  ____________________________________________  FINAL CLINICAL IMPRESSION(S) / ED DIAGNOSES  Final diagnoses:  Acute respiratory failure with hypoxia (HCC)  Somnolence    Note:  This document was prepared using Dragon voice recognition software and may include unintentional dictation errors.  Fonda Law, MD, [facility] Emergency Medicine    Anthony [name], Fonda MATSU, MD 01/07/24 [phone]  "

## 2024-01-07 NOTE — Progress Notes (Signed)
" °   01/07/24 0038  BiPAP/CPAP/SIPAP  $ Non-Invasive Ventilator  Non-Invasive Vent Initial  $ Face Mask Medium Yes  BiPAP/CPAP/SIPAP Pt Type Adult  BiPAP/CPAP/SIPAP SERVO (air)  Mask Type Full face mask  Dentures removed? Not applicable  Mask Size Medium  Set Rate 19 breaths/min  Respiratory Rate 22 breaths/min  IPAP 12 cmH20  EPAP 5 cmH2O  Pressure Support 7 cmH20 (PC)  PEEP 5 cmH20  FiO2 (%) 40 %  Minute Ventilation 14.1  Leak 37  Peak Inspiratory Pressure (PIP) 13  Tidal Volume (Vt) 542  Patient Home Machine No  Patient Home Mask No  Patient Home Tubing No  Auto Titrate No  Press High Alarm 25 cmH2O  CPAP/SIPAP surface wiped down Yes  Device Plugged into RED Power Outlet Yes  BiPAP/CPAP /SiPAP Vitals  Resp (!) 22  SpO2 94 %  MEWS Score/Color  MEWS Score 1  MEWS Score Color Green    "

## 2024-01-08 DIAGNOSIS — I5033 Acute on chronic diastolic (congestive) heart failure: Secondary | ICD-10-CM | POA: Diagnosis not present

## 2024-01-08 DIAGNOSIS — J189 Pneumonia, unspecified organism: Principal | ICD-10-CM

## 2024-01-08 DIAGNOSIS — E669 Obesity, unspecified: Secondary | ICD-10-CM

## 2024-01-08 LAB — CBC
HCT: 57.3 % — ABNORMAL HIGH (ref 39.0–52.0)
Hemoglobin: 16.8 g/dL (ref 13.0–17.0)
MCH: 26.9 pg (ref 26.0–34.0)
MCHC: 29.3 g/dL — ABNORMAL LOW (ref 30.0–36.0)
MCV: 91.7 fL (ref 80.0–100.0)
Platelets: 231 K/uL (ref 150–400)
RBC: 6.25 MIL/uL — ABNORMAL HIGH (ref 4.22–5.81)
RDW: 17 % — ABNORMAL HIGH (ref 11.5–15.5)
WBC: 9.6 K/uL (ref 4.0–10.5)
nRBC: 0 % (ref 0.0–0.2)

## 2024-01-08 LAB — BASIC METABOLIC PANEL WITH GFR
Anion gap: 9 (ref 5–15)
BUN: 17 mg/dL (ref 8–23)
CO2: 32 mmol/L (ref 22–32)
Calcium: 9.6 mg/dL (ref 8.9–10.3)
Chloride: 101 mmol/L (ref 98–111)
Creatinine, Ser: 1.2 mg/dL (ref 0.61–1.24)
GFR, Estimated: >60 mL/min
Glucose, Bld: 182 mg/dL — ABNORMAL HIGH (ref 70–99)
Potassium: 4.1 mmol/L (ref 3.5–5.1)
Sodium: 142 mmol/L (ref 135–145)

## 2024-01-08 LAB — CBG MONITORING, ED
Glucose-Capillary: 137 mg/dL — ABNORMAL HIGH (ref 70–99)
Glucose-Capillary: 175 mg/dL — ABNORMAL HIGH (ref 70–99)

## 2024-01-08 LAB — HEMOGLOBIN A1C
Hgb A1c MFr Bld: 7.8 % — ABNORMAL HIGH (ref 4.8–5.6)
Mean Plasma Glucose: 177.16 mg/dL

## 2024-01-08 LAB — GLUCOSE, CAPILLARY
Glucose-Capillary: 129 mg/dL — ABNORMAL HIGH (ref 70–99)
Glucose-Capillary: 125 mg/dL — ABNORMAL HIGH (ref 70–99)

## 2024-01-08 MED ORDER — UMECLIDINIUM BROMIDE 62.5 MCG/ACT IN AEPB
1.0000 | INHALATION_SPRAY | Freq: Every day | RESPIRATORY_TRACT | Status: DC
  Administered 2024-01-09 – 2024-01-18 (×9): 1 via RESPIRATORY_TRACT
  Filled 2024-01-08 (×3): qty 7

## 2024-01-08 MED ORDER — ARFORMOTEROL TARTRATE 15 MCG/2ML IN NEBU
15.0000 ug | INHALATION_SOLUTION | Freq: Two times a day (BID) | RESPIRATORY_TRACT | Status: DC
  Administered 2024-01-08 – 2024-01-18 (×19): 15 ug via RESPIRATORY_TRACT
  Filled 2024-01-08 (×19): qty 2

## 2024-01-08 MED ORDER — AZITHROMYCIN 250 MG PO TABS
500.0000 mg | ORAL_TABLET | Freq: Every day | ORAL | Status: DC
  Administered 2024-01-09 – 2024-01-11 (×3): 500 mg via ORAL
  Filled 2024-01-08 (×4): qty 2

## 2024-01-08 MED ORDER — SODIUM CHLORIDE 0.9 % IV SOLN
1.0000 g | INTRAVENOUS | Status: AC
  Administered 2024-01-08 – 2024-01-13 (×6): 1 g via INTRAVENOUS
  Filled 2024-01-08 (×6): qty 10

## 2024-01-08 MED ORDER — IPRATROPIUM-ALBUTEROL 0.5-2.5 (3) MG/3ML IN SOLN
3.0000 mL | Freq: Four times a day (QID) | RESPIRATORY_TRACT | Status: DC | PRN
  Administered 2024-01-11 – 2024-01-13 (×2): 3 mL via RESPIRATORY_TRACT
  Filled 2024-01-08 (×3): qty 3

## 2024-01-08 MED ORDER — PREDNISONE 20 MG PO TABS
60.0000 mg | ORAL_TABLET | Freq: Every day | ORAL | Status: DC
  Administered 2024-01-08 – 2024-01-14 (×7): 60 mg via ORAL
  Filled 2024-01-08 (×7): qty 3

## 2024-01-08 NOTE — ED Notes (Signed)
 Pt refused morning medications, RN explained to patient why the medication is important in his care, pt still refusing. MD made aware.

## 2024-01-08 NOTE — ED Notes (Signed)
 Pt continues to remove NRB. Oxygen saturations noted to be 70% on RA. Placed pt back on NRB at this time.

## 2024-01-08 NOTE — ED Notes (Signed)
 MD spoke with pt regarding medication. Pt took all his medications afterwards

## 2024-01-08 NOTE — Progress Notes (Signed)
 " Progress Note   Patient: Anthony [name]. FMW:[id number] DOB: 1950/03/13 DOA: 01/07/2024     1 DOS: the patient was seen and examined on 01/08/2024   Brief hospital course: Anthony Mutch. is a 73 y.o. male with medical history significant of COPD, CAD, A-fib on Eliquis  who presents emergency room with shortness of breath and confusion.  Patient is been having progressive worsening shortness of breath last 2 days and was found to be hypoxic satting in the low 80s.  He was placed on nasal cannula and was transferred to the ER for further assessment.  In the emergency department he was placed on BiPAP, labs obtained showed negative respiratory viral panel, BNP 740, chest x-ray showed interstitial edema and pleural effusions admitted to hospitalist service for CHF exacerbation.  Assessment and Plan: Acute hypoxic respiratory failure Acute on chronic diastolic heart failure Patient was hypoxic, tachypneic requiring BiPAP upon presentation. Currently he is requiring nonrebreather.  Continue to wean oxygen as tolerated. Cardiology evaluation appreciated, possibly there is a component of COPD.  Continue IV Lasix  40 twice daily. Monitor daily weights, strict input and output.  COPD exacerbation: Continue DuoNebs Q6 hourly. I started him on prednisone  60 mg daily. Continue home dose Brovana , Incruse Ellipta . Continue DuoNebs every 6 hours as needed.  Left sided pneumonia- Will start him on Rocephin  and azithromycin  for community-acquired pneumonia. Sputum cultures ordered.  Paroxysmal atrial fibrillation: Currently sinus rhythm.  Continue Eliquis  therapy.  CAD s/p RCA stent Continue statin therapy.  Hyperlipidemia-continue statin  Chronic pain-on gabapentin  therapy.  Mild cognitive impairment- Continue delirium precautions. Trazodone  as needed for insomnia.      Out of bed to chair. Incentive spirometry. Nursing supportive care. Fall, aspiration precautions. Diet:  Diet  Orders (From admission, onward)     Start     Ordered   01/07/24 1621  Diet heart healthy/carb modified Room service appropriate? Yes; Fluid consistency: Thin  Diet effective now       Question Answer Comment  Diet-HS Snack? Nothing   Room service appropriate? Yes   Fluid consistency: Thin      01/07/24 1620           DVT prophylaxis: SCDs Start: 01/07/24 9472 apixaban  (ELIQUIS ) tablet 5 mg  Level of care: Progressive   Code Status: Limited: Do not attempt resuscitation (DNR) -DNR-LIMITED -Do Not Intubate/DNI   Subjective: Patient is seen and examined today morning.  Patient is noted to be in moderate respiratory distress.  He is off BiPAP, placed on 15 L nonrebreather.  RN noted that he refused medications, but present during my interaction.  Physical Exam: Vitals:   01/08/24 1245 01/08/24 1441 01/08/24 1545 01/08/24 1600  BP: 126/88 116/73    Pulse: 82 72 69 70  Resp: 20 19 (!) 21   Temp:  97.7 F (36.5 C)    TempSrc:  Axillary    SpO2: 90% 95% 94% 97%    General - Elderly ill Caucasian male, moderate respiratory distress HEENT - PERRLA, EOMI, atraumatic head, non tender sinuses. Lung - Clear, diffuse rales, rhonchi, no wheezes.  On nonrebreather Heart - S1, S2 heard, no murmurs, rubs, trace pedal edema. Abdomen - Soft, non tender, bowel sounds good Neuro - Alert, awake and oriented, non focal exam. Skin - Warm and dry.  Data Reviewed:      Latest Ref Rng & Units 01/08/2024    5:00 AM 01/07/2024   12:45 AM 08/15/2023    5:10 AM  CBC  WBC 4.0 - 10.5 K/uL 9.6  8.0  10.7   Hemoglobin 13.0 - 17.0 g/dL 83.1  83.6  9.4   Hematocrit 39.0 - 52.0 % 57.3  54.6  33.3   Platelets 150 - 400 K/uL 231  234  206       Latest Ref Rng & Units 01/08/2024    5:00 AM 01/07/2024   12:45 AM 08/15/2023    5:10 AM  BMP  Glucose 70 - 99 mg/dL 817  763  864   BUN 8 - 23 mg/dL 17  17  23    Creatinine 0.61 - 1.24 mg/dL 8.79  8.94  9.02   Sodium 135 - 145 mmol/L 142  140  138    Potassium 3.5 - 5.1 mmol/L 4.1  4.4  3.8   Chloride 98 - 111 mmol/L 101  104  104   CO2 22 - 32 mmol/L 32  26  27   Calcium  8.9 - 10.3 mg/dL 9.6  9.3  8.3    ECHOCARDIOGRAM COMPLETE Result Date: 01/07/2024    ECHOCARDIOGRAM REPORT   Patient Name:   Anthony [name]. Date of Exam: 01/07/2024 Medical Rec #:  [id number]          Height:       69.0 in Accession #:    [id number]         Weight:       170.9 lb Date of Birth:  1950/12/16          BSA:          1.932 m Patient Age:    73 years           BP:           122/75 mmHg Patient Gender: M                  HR:           56 bpm. Exam Location:  Inpatient Procedure: 2D Echo, Cardiac Doppler and Color Doppler (Both Spectral and Color            Flow Doppler were utilized during procedure). Indications:    CHF I50.31  History:        Patient has prior history of Echocardiogram examinations, most                 recent 07/26/2023. CHF.  Sonographer:    Nathanel Devonshire Referring Phys: [id number] ROBERT DORRELL IMPRESSIONS  1. Left ventricular ejection fraction, by estimation, is 50 to 55%. The left ventricle has low normal function. The left ventricle has no regional wall motion abnormalities. There is mild concentric left ventricular hypertrophy. Left ventricular diastolic parameters are consistent with Grade I diastolic dysfunction (impaired relaxation).  2. Right ventricular systolic function is normal. The right ventricular size is normal.  3. The mitral valve is normal in structure. No evidence of mitral valve regurgitation. No evidence of mitral stenosis.  4. The aortic valve is normal in structure. Aortic valve regurgitation is not visualized. No aortic stenosis is present.  5. The inferior vena cava is normal in size with greater than 50% respiratory variability, suggesting right atrial pressure of 3 mmHg. FINDINGS  Left Ventricle: Left ventricular ejection fraction, by estimation, is 50 to 55%. The left ventricle has low normal function. The left ventricle has no  regional wall motion abnormalities. The left ventricular internal cavity size was normal in size. There is mild concentric left ventricular hypertrophy. Left ventricular diastolic parameters are  consistent with Grade I diastolic dysfunction (impaired relaxation). Right Ventricle: The right ventricular size is normal. No increase in right ventricular wall thickness. Right ventricular systolic function is normal. Left Atrium: Left atrial size was normal in size. Right Atrium: Right atrial size was normal in size. Pericardium: There is no evidence of pericardial effusion. Mitral Valve: The mitral valve is normal in structure. No evidence of mitral valve regurgitation. No evidence of mitral valve stenosis. Tricuspid Valve: The tricuspid valve is normal in structure. Tricuspid valve regurgitation is not demonstrated. No evidence of tricuspid stenosis. Aortic Valve: The aortic valve is normal in structure. Aortic valve regurgitation is not visualized. No aortic stenosis is present. Aortic valve mean gradient measures 3.0 mmHg. Aortic valve peak gradient measures 8.3 mmHg. Aortic valve area, by VTI measures 2.23 cm. Pulmonic Valve: The pulmonic valve was normal in structure. Pulmonic valve regurgitation is not visualized. No evidence of pulmonic stenosis. Aorta: The aortic root is normal in size and structure. Venous: The inferior vena cava is normal in size with greater than 50% respiratory variability, suggesting right atrial pressure of 3 mmHg. IAS/Shunts: No atrial level shunt detected by color flow Doppler.  LEFT VENTRICLE PLAX 2D LVIDd:         5.50 cm     Diastology LVIDs:         3.90 cm     LV e' medial:    4.13 cm/s LV PW:         1.10 cm     LV E/e' medial:  10.4 LV IVS:        1.00 cm     LV e' lateral:   4.90 cm/s LVOT diam:     2.00 cm     LV E/e' lateral: 8.7 LV SV:         53 LV SV Index:   27 LVOT Area:     3.14 cm LV IVRT:       106 msec  LV Volumes (MOD) LV vol d, MOD A2C: 81.9 ml LV vol d, MOD A4C:  76.4 ml LV vol s, MOD A2C: 39.5 ml LV vol s, MOD A4C: 34.3 ml LV SV MOD A2C:     42.4 ml LV SV MOD A4C:     76.4 ml LV SV MOD BP:      43.0 ml RIGHT VENTRICLE          IVC RV Basal diam:  3.60 cm  IVC diam: 1.80 cm TAPSE (M-mode): 2.7 cm                          PULMONARY VEINS                          Diastolic Velocity: 23.40 cm/s                          S/D Velocity:       1.80                          Systolic Velocity:  41.80 cm/s LEFT ATRIUM             Index        RIGHT ATRIUM           Index LA diam:        3.70 cm 1.91 cm/m   RA  Area:     16.40 cm LA Vol (A2C):   55.0 ml 28.46 ml/m  RA Volume:   38.50 ml  19.93 ml/m LA Vol (A4C):   37.7 ml 19.51 ml/m LA Biplane Vol: 49.0 ml 25.36 ml/m  AORTIC VALVE                    PULMONIC VALVE AV Area (Vmax):    1.67 cm     PV Vmax:       0.84 m/s AV Area (Vmean):   1.86 cm     PV Peak grad:  2.8 mmHg AV Area (VTI):     2.23 cm AV Vmax:           144.00 cm/s AV Vmean:          81.600 cm/s AV VTI:            0.237 m AV Peak Grad:      8.3 mmHg AV Mean Grad:      3.0 mmHg LVOT Vmax:         76.40 cm/s LVOT Vmean:        48.400 cm/s LVOT VTI:          0.168 m LVOT/AV VTI ratio: 0.71  AORTA Ao Root diam: 3.40 cm Ao Asc diam:  3.50 cm MITRAL VALVE MV Area (PHT): 2.32 cm    SHUNTS MV E velocity: 42.80 cm/s  Systemic VTI:  0.17 m MV A velocity: 66.00 cm/s  Systemic Diam: 2.00 cm MV E/A ratio:  0.65 Morene Brownie Electronically signed by Morene Brownie Signature Date/Time: 01/07/2024/10:31:23 AM    Final    DG Chest 2 View Result Date: 01/07/2024 EXAM: 2 VIEW(S) XRAY OF THE CHEST 01/07/2024 12:38:50 AM COMPARISON: 08/11/2023 CLINICAL HISTORY: SOB FINDINGS: LUNGS AND PLEURA: Pulmonary interstitial prominence with mild pulmonary edema. Small left pleural effusion. Left basilar consolidation. No pneumothorax. HEART AND MEDIASTINUM: Cardiomegaly. Calcified aorta. BONES AND SOFT TISSUES: Multilevel degenerative changes of thoracic spine. IMPRESSION: 1. Pulmonary  interstitial prominence with mild pulmonary edema. 2. Small left pleural effusion. 3. Left basilar consolidation. 4. Cardiomegaly. Electronically signed by: Franky Stanford MD 01/07/2024 02:43 AM EST RP Workstation: [id number]    Family Communication: Discussed with patient, understand and agree. All questions answered.  Disposition: Status is: Inpatient Remains inpatient appropriate because: IV Lasix , IV antibiotics, steroid therapy.  Wean oxygen.  Planned Discharge Destination: Home with Home Health     Time spent: 54 minutes  Author: Concepcion Riser, MD 01/08/2024 4:50 PM Secure chat 7am to 7pm For on call review [email or web address].    "

## 2024-01-08 NOTE — ED Notes (Signed)
 Pt spo2 at 80% on room air, pt removed NRB. This RN entered the room, assisted the patient in sitting up, applied NRB on patient with spo2 improvement to 93%.  Provided pt education on why it is important to keep the mask on, Pt verbalized an understanding.

## 2024-01-08 NOTE — Progress Notes (Signed)
" ° °  Brief Progress Note   _____________________________________________________________________________________________________________  Patient Name: Anthony [name]. Patient DOB: [birth year] Date: @TODAY @      Data: Reviewed labs, notes, VS, O2 requirements.    Action: No action needed at this time.      Response:    _____________________________________________________________________________________________________________  [facility] RN Expeditor Sharolyn JONETTA Batman Please contact us  directly via secure chat (search for [facility]) or by calling us  at [phone] [facility]).  "

## 2024-01-08 NOTE — Consult Note (Addendum)
 "  Cardiology Consultation  Patient ID: Anthony [name]. MRN: [id number]; DOB: 09-Dec-1950  Admit date: 01/07/2024 Date of Consult: 01/08/2024  PCP:  Anthony [name] [facility] Providers Cardiologist:  None     Patient Profile: Anthony [name]. is a 73 y.o. male with a hx of CAD s/p inferior STEMI 06/2010 treated with BMS to the mid RCA, had moderate nonobstructive disease in the LAD, inferior hypokinesis EF 50%, paroxysmal atrial fibrillation, history of recurrent DVT in 2008 & 2014, COPD on chronic 3 L, ILD, PAD s/p left AKA, tobacco abuse, HLD, bipolar disorder, paroxysmal A-Fib who is being seen 01/08/2024 for the evaluation of CHF at the request of Dr. Darci.  History of Present Illness: Anthony [name] has past medical history as stated above.  He presented to the [facility] emergency department on 01/07/2024 with shortness of breath, altered mental status from [facility].  On arrival he was significantly confused, only oriented to self.  He was found to be hypoxic with SpO2 in the low 80s on room air.  He was placed on BiPAP while in the emergency department.  Relevant workup while in the ED includes: proBNP mildly elevated at 740, troponin mildly elevated and flat 34 ? 32, CBC shows hemoglobin at 16.3, noted to be 9.4 in August 2025, metabolic panel showed creatinine 1.05 ? 1.20 (up from 0.97).  CXR showed mild pulmonary edema, cardiomegaly, small left pleural effusion, left basilar consolidation.  Echocardiogram this admission showed: LVEF 50 to 55%, no RWMA, mild LVH, G1 DD, normal RV systolic function, normal IVC.  Previous relevant home medications include: Eliquis  5 mg twice daily, Lipitor 40 mg daily, Lasix  20 mg daily, with 10 mEq potassium daily.  Upon examination, patient remains confused. The notes indicated that he had refused his AM medications, RN confirms this, however the patient does not recall this happening. He is unable to tell me much in terms of  history as he does not recall anything. He says that his breathing has only been bad since he has been in here. He is a poor historian.  He is presently on a NRB at 15 L, coughing frequently in the room. He tells me that he overall feels okay.   Past Medical History:  Diagnosis Date   Anxiety    Arthritis    Atrial fibrillation (HCC)    Atrial flutter (HCC)    Bipolar disorder (HCC)    CAD (coronary artery disease)    a. INF STEMI 07/04/10:  tx with thrombectomy + [facility] BMS to [facility];  cath 07/04/10: dLM 10-20%, pLAD 40-50%, mLAD 20-30%, pRCA 30%, mRCA occluded and tx with PCI, EF 50% with inf HK. A Multilink   COPD (chronic obstructive pulmonary disease) (HCC)    Diabetes mellitus (HCC)    History of DVT (deep vein thrombosis)    HLD (hyperlipidemia)    Hypertension    Left above-knee amputee (HCC)    PAD (peripheral artery disease)    Polysubstance abuse (HCC)    Prior hx   Tobacco abuse    Urinary frequency    Vitamin D  deficiency 09/05/2018   Past Surgical History:  Procedure Laterality Date   CARDIAC CATHETERIZATION     COLONOSCOPY N/A 12/29/2015   Procedure: COLONOSCOPY;  Surgeon: Anthony RAYMOND Rivet, [name];  [facility]: [facility];  Service: Endoscopy;  Laterality: N/A;  12:55   ELECTROCARDIOGRAM     Showed inferolateral ST-elevation and a code STEMI was activated. In  the ER, he was treated with morphine , herarin, and 600 mg of Plavix. He was transferred emergently to [facility].    INGUINAL HERNIA REPAIR Left 07/26/2014   Procedure: OPEN REPAIR OF RECURENT LEFT INGUINAL HERNIA REPAIR WITH MESH;  Surgeon: Anthony Blush, [name];  Location: [facility] ORS;  Service: General;  Laterality: Left;   INSERTION OF MESH Bilateral 10/30/2013   Procedure: INSERTION OF MESH;  Surgeon: Anthony CHRISTELLA Blush, [name];  Location: [facility];  Service: General;  Laterality: Bilateral;   LAPAROSCOPIC INGUINAL HERNIA WITH UMBILICAL HERNIA Bilateral 10/30/2013   Procedure: laparoscopic repair left pantaloom hernia with  mesh, laparoscopic right inguinal hernia with mesh, OPEN REPAIR OF UMBILICAL HERNIA ;  Surgeon: Anthony CHRISTELLA Blush, [name];  Location: [facility];  Service: General;  Laterality: Bilateral;   LAPAROSCOPY N/A 07/26/2014   Procedure: LAPAROSCOPY DIAGNOSTIC;  Surgeon: Anthony Blush, [name];  Location: [facility];  Service: General;  Laterality: N/A;   POLYPECTOMY  12/29/2015   Procedure: POLYPECTOMY;  Surgeon: Anthony RAYMOND Rivet, [name];  [facility];  Service: Endoscopy;;  ascending colon, descending colon   stent placement     TOTAL HIP ARTHROPLASTY Right 05/10/2015   Procedure: RIGHT TOTAL HIP ARTHROPLASTY ANTERIOR APPROACH;  Surgeon: Anthony Car, [name];  Location: [facility];  Service: Orthopedics;  Laterality: Right;   VASECTOMY      Home Medications:  Prior to Admission medications  Medication Sig Start Date End Date Taking? Authorizing Provider  acetaminophen  (TYLENOL ) 500 MG tablet Take 1,000 mg by mouth in the morning, at noon, and at bedtime.   Yes Anthony [name]  albuterol  (VENTOLIN  HFA) 108 (90 Base) MCG/ACT inhaler Inhale 2 puffs into the lungs 3 (three) times daily as needed for wheezing.   Yes Anthony [name]  apixaban  (ELIQUIS ) 5 MG TABS tablet Take 1 tablet (5 mg total) by mouth 2 (two) times daily. Patient taking differently: Take 5 mg by mouth in the morning and at bedtime. 03/03/21  Yes Anthony Celinda Balo, [name]  atorvastatin  (LIPITOR) 40 MG tablet Take 1 tablet (40 mg total) by mouth daily. Patient taking differently: Take 40 mg by mouth every evening. 12/22/18  Yes Alexander, Natalie, DO  Baclofen  5 MG TABS Take 5 mg by mouth in the morning and at bedtime.   Yes Anthony [name]  BENADRYL  ALLERGY 25 MG tablet Take 25 mg by mouth 2 (two) times daily as needed for allergies.   Yes Anthony [name]  cyanocobalamin  1000 MCG tablet Take 1 tablet (1,000 mcg total) by mouth daily. 08/16/23  Yes Arrien, Anthony Sieving, [name]  escitalopram  (LEXAPRO ) 10 MG tablet Take 1 tablet (10 mg  total) by mouth at bedtime. 03/03/21  Yes Anthony Celinda Balo, [name]  famotidine (PEPCID) 40 MG tablet Take 40 mg by mouth at bedtime.   Yes Anthony [name]  ferrous sulfate  325 (65 FE) MG tablet Take 1 tablet (325 mg total) by mouth daily with breakfast. 08/16/23  Yes Arrien, Anthony Sieving, [name]  fexofenadine (ALLEGRA) 180 MG tablet Take 180 mg by mouth at bedtime.   Yes Anthony [name]  Fluticasone -Umeclidin-Vilant (TRELEGY ELLIPTA ) 200-62.5-25 MCG/ACT AEPB Inhale 1 puff into the lungs daily. 08/15/23  Yes Arrien, Anthony Sieving, [name]  furosemide  (LASIX ) 20 MG tablet Take 1 tablet (20 mg total) by mouth daily. 08/16/23  Yes Arrien, Anthony Sieving, [name]  gabapentin  (NEURONTIN ) 100 MG capsule Take 100 mg by mouth in the morning and at bedtime.   Yes Anthony [name]  glipiZIDE  (  GLUCOTROL ) 10 MG tablet Take 1 tablet (10 mg total) by mouth daily before breakfast. 08/15/23  Yes Arrien, Anthony Sieving, [name]  guaiFENesin  (MUCINEX ) 600 MG 12 hr tablet Take 600 mg by mouth 2 (two) times daily.   Yes Anthony [name]  ipratropium-albuterol  (DUONEB) 0.5-2.5 (3) MG/3ML SOLN Take 3 mLs by nebulization 3 (three) times daily after meals.   Yes Anthony [name]  metFORMIN  (GLUCOPHAGE ) 1000 MG tablet Take 1,000 mg by mouth daily with breakfast.   Yes Anthony [name]  nystatin ointment (MYCOSTATIN) Apply 1 Application topically 2 (two) times daily as needed (for irritation- a thin layer to the groin area).   Yes Anthony [name]  Olopatadine  HCl (PATADAY ) 0.2 % SOLN Place 1 drop into both eyes in the morning.   Yes Anthony [name]  Polyethyl Glycol-Propyl Glycol (SYSTANE) 0.4-0.3 % SOLN Place 1 drop into both eyes as needed (for dryness and irritation).   Yes Anthony [name]  polyethylene glycol (MIRALAX  / GLYCOLAX ) 17 g packet Take 17 g by mouth See admin instructions. Mix 17 grams into 4-6 ounces of a beverage and drink by mouth every other day   Yes  Anthony [name]  potassium chloride  (KLOR-CON  M) 10 MEQ tablet Take 1 tablet (10 mEq total) by mouth daily. 08/15/23  Yes Arrien, Anthony Sieving, [name]  ramelteon  (ROZEREM ) 8 MG tablet Take 8 mg by mouth at bedtime.   Yes Anthony [name]  Semaglutide  7 MG TABS Take 7 mg by mouth daily.   Yes Anthony [name]  sodium chloride  (OCEAN) 0.65 % nasal spray Place 1 spray into the nose 3 (three) times daily.   Yes Anthony [name]  STIOLTO RESPIMAT  2.5-2.5 MCG/ACT AERS Inhale 2 puffs into the lungs in the morning.   Yes Anthony [name]  traZODone  (DESYREL ) 100 MG tablet Take 200 mg by mouth at bedtime. 12/11/21  Yes Anthony [name]  traZODone  (DESYREL ) 50 MG tablet Take 25 mg by mouth at bedtime. 07/08/23  Yes Anthony [name]  blood glucose meter kit and supplies KIT Dispense based on patient and insurance preference. Use to check BG once daily or every other other.  Dx: E11.9 07/17/16   Lavell Bari LABOR, FNP  feeding supplement, GLUCERNA SHAKE, (GLUCERNA SHAKE) LIQD Take 237 mLs by mouth 3 (three) times daily between meals. Patient not taking: Reported on 01/07/2024 08/15/23   Arrien, Mauricio Daniel, [name]  metFORMIN  (GLUCOPHAGE -XR) 500 MG 24 hr tablet TAKE 2 TABLETS BY MOUTH  DAILY WITH BREAKFAST Patient not taking: Reported on 01/07/2024 01/18/20   [name], DO   Scheduled Meds:  apixaban   5 mg Oral BID   atorvastatin   40 mg Oral Daily   escitalopram   10 mg Oral QHS   furosemide   40 mg Intravenous BID   gabapentin   100 mg Oral BID   insulin  aspart  0-9 Units Subcutaneous TID WC   ipratropium-albuterol   3 mL Nebulization TID   traZODone   200 mg Oral QHS   Continuous Infusions:  PRN Meds: acetaminophen  **OR** acetaminophen , oxyCODONE   Allergies:   Allergies[1]  Social History:   Social History   Socioeconomic History   Marital status: Divorced    Spouse name: Not on file   Number of children: Not on file   Years of education: Not  on file   Highest education level: Not on file  Occupational History   Not on file  Tobacco Use   Smoking status: Former    Current packs/day:  1.50    Average packs/day: 1.5 packs/day for 45.0 years (67.5 ttl pk-yrs)    Types: Cigarettes   Smokeless tobacco: Never   Tobacco comments:    Pt states quitting 7 months ago 07/04/24 ARJ   Vaping Use   Vaping status: Never Used  Substance and Sexual Activity   Alcohol  use: No    Comment: past hx of ETOH use was in inpt facility per court order  - 20 years, 10-18-2015 per pt not anymore,per pt stopped about 15-20 yrs ago   Drug use: No    Types: Marijuana, Heroin, Cocaine    Comment: snorted heroin and cocaine, 10-18-2015 per pt in the past he did Marijuana only and nothing else   Sexual activity: Not Currently    Partners: Female  Other Topics Concern   Not on file  Social History Narrative   Lives in [place]. He lives alone. He has kids but is not married.    Social Drivers of Health   Tobacco Use: Medium Risk (01/07/2024)   Patient History    Smoking Tobacco Use: Former    Smokeless Tobacco Use: Never    Passive Exposure: Not on file  Financial Resource Strain: Not on file  Food Insecurity: Patient Unable To Answer (07/26/2023)   Epic    Worried About Programme Researcher, Broadcasting/film/video in the Last Year: Patient unable to answer    Ran Out of Food in the Last Year: Patient unable to answer  Transportation Needs: Patient Unable To Answer (07/26/2023)   Epic    Lack of Transportation (Medical): Patient unable to answer    Lack of Transportation (Non-Medical): Patient unable to answer  Physical Activity: Not on file  Stress: Not on file  Social Connections: Patient Unable To Answer (07/26/2023)   Social Connection and Isolation Panel    Frequency of Communication with Friends and Family: Patient unable to answer    Frequency of Social Gatherings with Friends and Family: Patient unable to answer    Attends Religious Services: Patient unable to  answer    Active Member of Clubs or Organizations: Patient unable to answer    Attends Banker Meetings: Patient unable to answer    Marital Status: Patient unable to answer  Intimate Partner Violence: Patient Unable To Answer (07/26/2023)   Epic    Fear of Current or Ex-Partner: Patient unable to answer    Emotionally Abused: Patient unable to answer    Physically Abused: Patient unable to answer    Sexually Abused: Patient unable to answer  Depression (PHQ2-9): Not on file  Alcohol  Screen: Not on file  Housing: Patient Unable To Answer (07/26/2023)   Epic    Unable to Pay for Housing in the Last Year: Patient unable to answer    Number of Times Moved in the Last Year: Not on file    Homeless in the Last Year: Patient unable to answer  Utilities: Patient Unable To Answer (07/26/2023)   Epic    Threatened with loss of utilities: Patient unable to answer  Health Literacy: Not on file    Family History:   Family History  Problem Relation Age of Onset   Coronary artery disease Father    Depression Father    Heart attack Father    Depression Mother     ROS:  Please see the history of present illness.  All other ROS reviewed and negative.     Physical Exam/Data: Vitals:   01/08/24 9391 01/08/24 9156 01/08/24 0915  01/08/24 0931  BP:  110/61 126/76   Pulse:  (!) 58 68   Resp:  18    Temp: 97.8 F (36.6 C) 98.1 F (36.7 C)    TempSrc: Oral     SpO2:  99% 97% 93%    Intake/Output Summary (Last 24 hours) at 01/08/2024 1029 Last data filed at 01/07/2024 1825 Gross per 24 hour  Intake --  Output 1 ml  Net -1 ml      08/15/2023    6:15 AM 08/14/2023    6:18 AM 08/12/2023    4:23 AM  Last 3 Weights  Weight (lbs) 170 lb 13.7 oz 178 lb 3.9 oz 175 lb 3.2 oz  Weight (kg) 77.5 kg 80.85 kg 79.47 kg     There is no height or weight on file to calculate BMI.   General:  resting, on 15 L via NRB, no complaints  HEENT: normal Neck: cannot appreciate JVD Vascular:  Distal pulses 2+ bilaterally Cardiac:  normal S1, S2; RRR Lungs:  distant lung sounds,  mild crackles Abd: soft, nontender, no hepatomegaly  Ext: mild nonpitting edema  Musculoskeletal:  left AKA Skin: warm and dry  Neuro:  no focal abnormalities noted  EKG:  The EKG was personally reviewed and demonstrates: Sinus rhythm, HR 65  Telemetry:  Telemetry was personally reviewed and demonstrates:  sinus   Relevant CV Studies:  Echocardiogram, 01/07/2024 Left ventricular ejection fraction, by estimation, is 50 to 55% . The left ventricle has low normal function. The left ventricle has no regional wall motion abnormalities. There is mild concentric left ventricular hypertrophy. Left ventricular diastolic parameters are consistent with Grade I diastolic dysfunction ( impaired relaxation) .  Right ventricular systolic function is normal. The right ventricular size is normal.  The mitral valve is normal in structure. No evidence of mitral valve regurgitation. No evidence of mitral stenosis.  The aortic valve is normal in structure. Aortic valve regurgitation is not visualized. No aortic stenosis is present.  The inferior vena cava is normal in size with greater than 50% respiratory variability, suggesting right atrial pressure of 3 mmHg.  Laboratory Data: High Sensitivity Troponin:  No results for input(s): TROPONINIHS in the last 720 hours.  Recent Labs  Lab 01/07/24 0045 01/07/24 0225  TRNPT 34* 32*      Chemistry Recent Labs  Lab 01/07/24 0045 01/08/24 0500  NA 140 142  K 4.4 4.1  CL 104 101  CO2 26 32  GLUCOSE 236* 182*  BUN 17 17  CREATININE 1.05 1.20  CALCIUM  9.3 9.6  GFRNONAA >60 >60  ANIONGAP 10 9    Recent Labs  Lab 01/07/24 0045  PROT 7.0  ALBUMIN  4.0  AST 13*  ALT 10  ALKPHOS 62  BILITOT 0.3   Lipids No results for input(s): CHOL, TRIG, HDL, LABVLDL, LDLCALC, CHOLHDL in the last 168 hours.  Hematology Recent Labs  Lab 01/07/24 0045  01/08/24 0500  WBC 8.0 9.6  RBC 6.05* 6.25*  HGB 16.3 16.8  HCT 54.6* 57.3*  MCV 90.2 91.7  MCH 26.9 26.9  MCHC 29.9* 29.3*  RDW 17.0* 17.0*  PLT 234 231   Thyroid  No results for input(s): TSH, FREET4 in the last 168 hours.  BNP Recent Labs  Lab 01/07/24 0045  PROBNP 740.0*    DDimer No results for input(s): DDIMER in the last 168 hours.  Radiology/Studies:  ECHOCARDIOGRAM COMPLETE Result Date: 01/07/2024    ECHOCARDIOGRAM REPORT   Patient Name:   Anthony [name]  Mickey. Date of Exam: 01/07/2024 Medical Rec #:  [id number]          Height:       69.0 in Accession #:    [id number]         Weight:       170.9 lb Date of Birth:  [birth year]          BSA:          1.932 m Patient Age:    73 years           BP:           122/75 mmHg Patient Gender: M                  HR:           56 bpm. Exam Location:  Inpatient Procedure: 2D Echo, Cardiac Doppler and Color Doppler (Both Spectral and Color            Flow Doppler were utilized during procedure). Indications:    CHF I50.31  History:        Patient has prior history of Echocardiogram examinations, most                 recent 07/26/2023. CHF.  Sonographer:    Nathanel Devonshire Referring Phys: [id number] ROBERT DORRELL IMPRESSIONS  1. Left ventricular ejection fraction, by estimation, is 50 to 55%. The left ventricle has low normal function. The left ventricle has no regional wall motion abnormalities. There is mild concentric left ventricular hypertrophy. Left ventricular diastolic parameters are consistent with Grade I diastolic dysfunction (impaired relaxation).  2. Right ventricular systolic function is normal. The right ventricular size is normal.  3. The mitral valve is normal in structure. No evidence of mitral valve regurgitation. No evidence of mitral stenosis.  4. The aortic valve is normal in structure. Aortic valve regurgitation is not visualized. No aortic stenosis is present.  5. The inferior vena cava is normal in size with greater than 50%  respiratory variability, suggesting right atrial pressure of 3 mmHg. FINDINGS  Left Ventricle: Left ventricular ejection fraction, by estimation, is 50 to 55%. The left ventricle has low normal function. The left ventricle has no regional wall motion abnormalities. The left ventricular internal cavity size was normal in size. There is mild concentric left ventricular hypertrophy. Left ventricular diastolic parameters are consistent with Grade I diastolic dysfunction (impaired relaxation). Right Ventricle: The right ventricular size is normal. No increase in right ventricular wall thickness. Right ventricular systolic function is normal. Left Atrium: Left atrial size was normal in size. Right Atrium: Right atrial size was normal in size. Pericardium: There is no evidence of pericardial effusion. Mitral Valve: The mitral valve is normal in structure. No evidence of mitral valve regurgitation. No evidence of mitral valve stenosis. Tricuspid Valve: The tricuspid valve is normal in structure. Tricuspid valve regurgitation is not demonstrated. No evidence of tricuspid stenosis. Aortic Valve: The aortic valve is normal in structure. Aortic valve regurgitation is not visualized. No aortic stenosis is present. Aortic valve mean gradient measures 3.0 mmHg. Aortic valve peak gradient measures 8.3 mmHg. Aortic valve area, by VTI measures 2.23 cm. Pulmonic Valve: The pulmonic valve was normal in structure. Pulmonic valve regurgitation is not visualized. No evidence of pulmonic stenosis. Aorta: The aortic root is normal in size and structure. Venous: The inferior vena cava is normal in size with greater than 50% respiratory variability, suggesting right atrial pressure of 3 mmHg. IAS/Shunts: No atrial level  shunt detected by color flow Doppler.  LEFT VENTRICLE PLAX 2D LVIDd:         5.50 cm     Diastology LVIDs:         3.90 cm     LV e' medial:    4.13 cm/s LV PW:         1.10 cm     LV E/e' medial:  10.4 LV IVS:        1.00 cm      LV e' lateral:   4.90 cm/s LVOT diam:     2.00 cm     LV E/e' lateral: 8.7 LV SV:         53 LV SV Index:   27 LVOT Area:     3.14 cm LV IVRT:       106 msec  LV Volumes (MOD) LV vol d, MOD A2C: 81.9 ml LV vol d, MOD A4C: 76.4 ml LV vol s, MOD A2C: 39.5 ml LV vol s, MOD A4C: 34.3 ml LV SV MOD A2C:     42.4 ml LV SV MOD A4C:     76.4 ml LV SV MOD BP:      43.0 ml RIGHT VENTRICLE          IVC RV Basal diam:  3.60 cm  IVC diam: 1.80 cm TAPSE (M-mode): 2.7 cm                          PULMONARY VEINS                          Diastolic Velocity: 23.40 cm/s                          S/D Velocity:       1.80                          Systolic Velocity:  41.80 cm/s LEFT ATRIUM             Index        RIGHT ATRIUM           Index LA diam:        3.70 cm 1.91 cm/m   RA Area:     16.40 cm LA Vol (A2C):   55.0 ml 28.46 ml/m  RA Volume:   38.50 ml  19.93 ml/m LA Vol (A4C):   37.7 ml 19.51 ml/m LA Biplane Vol: 49.0 ml 25.36 ml/m  AORTIC VALVE                    PULMONIC VALVE AV Area (Vmax):    1.67 cm     PV Vmax:       0.84 m/s AV Area (Vmean):   1.86 cm     PV Peak grad:  2.8 mmHg AV Area (VTI):     2.23 cm AV Vmax:           144.00 cm/s AV Vmean:          81.600 cm/s AV VTI:            0.237 m AV Peak Grad:      8.3 mmHg AV Mean Grad:      3.0 mmHg LVOT Vmax:         76.40 cm/s LVOT Vmean:  48.400 cm/s LVOT VTI:          0.168 m LVOT/AV VTI ratio: 0.71  AORTA Ao Root diam: 3.40 cm Ao Asc diam:  3.50 cm MITRAL VALVE MV Area (PHT): 2.32 cm    SHUNTS MV E velocity: 42.80 cm/s  Systemic VTI:  0.17 m MV A velocity: 66.00 cm/s  Systemic Diam: 2.00 cm MV E/A ratio:  0.65 Morene Brownie Electronically signed by Morene Brownie Signature Date/Time: 01/07/2024/10:31:23 AM    Final    DG Chest 2 View Result Date: 01/07/2024 EXAM: 2 VIEW(S) XRAY OF THE CHEST 01/07/2024 12:38:50 AM COMPARISON: 08/11/2023 CLINICAL HISTORY: SOB FINDINGS: LUNGS AND PLEURA: Pulmonary interstitial prominence with mild pulmonary edema.  Small left pleural effusion. Left basilar consolidation. No pneumothorax. HEART AND MEDIASTINUM: Cardiomegaly. Calcified aorta. BONES AND SOFT TISSUES: Multilevel degenerative changes of thoracic spine. IMPRESSION: 1. Pulmonary interstitial prominence with mild pulmonary edema. 2. Small left pleural effusion. 3. Left basilar consolidation. 4. Cardiomegaly. Electronically signed by: Franky Stanford [name] 01/07/2024 02:43 AM EST RP Workstation: [id number]   Assessment and Plan:  Acute hypoxic respiratory failure Presented with [facility], AMS Noted to have SpO2 in low-80s on RA Placed on BiPAP ? NRB Underlying history of COPD and CHF Hemoglobin increased to 16.8 from 8-9s in August -- suspect hypoxia has been chronic  Continue to treat possible CHF exacerbation with treatment of underlying lung disease to primary team   Suspected acute on Chronic HFpEF Echo this admission: LVEF 50-55%, G1DD, normal RV ystolic function, normal IVC Home meds: PO Lasix  20 mg daily  proBNP 740 CXR showed mild edema, consolidation Started on IV Lasix   Patient unable to confirm good urine output/I&O's not charted  Creatinine 1.05 ? 1.20  Patient is poor historian, no one else present in room Patient denies any current chest pain He is presently on NRB Suspect that shortness of breath is likely multifactorial in the setting of COPD, possible infection, CHF -- does not appear significantly volume overloaded Currently on IV Lasix  40 mg BID, continue for today, monitor renal function and urine output with low threshold to switch back to PO as it his shortness of breath may be more from underlying COPD/lung disease   Paroxysmal A-Fib/flutter Presently in sinus with rates 60-70s On Eliquis  5 mg BID at home  Previously on BB but did not tolerate with hypotension   With history of CAD, chronic lung disease, and current state of health, would not be a good candidate for alternative antiarrhythmics   CAD s/p STEMI 2012 with stent to  RCA Hyperlipidemia  Troponin 34 ? 32  Home meds: Lipitor 40 mg daily, no ASA with Eliquis  use  07/27/2023: HDL 44; LDL Cholesterol 40 01/07/2024: ALT 10  Continue statin   Per primary Acute hypoxic respiratory failure Possible pneumonia COPD exacerbation Interstitial lung disease  AKI History of PAD s/p left AKA Acute encephalopathy Diabetes  Risk Assessment/Risk Scores:      [place]  Heart Association (NYHA) Functional Class Unable to assess symptoms   CHA2DS2-VASc Score = 5   This indicates a 7.2% annual risk of stroke. The patient's score is based upon: CHF History: 1 HTN History: 1 Diabetes History: 1 Stroke History: 0 Vascular Disease History: 1 Age Score: 1 Gender Score: 0      For questions or updates, please contact [facility] Please consult [email or web address] for contact info under   Signed, [name], PA-C  01/08/2024 10:29 AM  Personally seen and examined. Agree with above.  73 year old with ejection fraction 50% inferior hypokinesis with paroxysmal atrial fibrillation recurrent DVT COPD PAD left AKA bipolar prior inferior STEMI in 2012 treated with bare-metal stent to the mid RCA here with shortness of breath altered mental status from [facility].  Troponin flat in the 30s, hemoglobin 16, creatinine 1.2 up from 0.97, pro BNP mildly elevated 740  Echocardiogram with EF 50 to 55%  Chronic diastolic heart failure - EF 50 to 55% low normal.  Actually does not appear to be volume overloaded currently.  He did get IV Lasix  ordered 40 mg twice daily.  Tomorrow if renal function worsens, consider switching back to low-dose p.o.  COPD playing a role.  Atrial fibrillation flutter - Takes Eliquis  at home.  Compliance may be a factor.  Try to avoid amiodarone with COPD.  CAD prior STEMI 2012 stent to RCA - Stable.  Continue with statin therapy Lipitor 40 mg a day no need for aspirin  since he is on Eliquis .  AKA/diabetes - High risk  feature.  Oneil Parchment, [name]     [1]  Allergies Allergen Reactions   Flomax [Tamsulosin Hcl] Nausea Only and Other (See Comments)    This medication makes patient feel sick and Allergic, per facility   Penicillins Hives and Other (See Comments)    Allergic, per facility   "

## 2024-01-08 NOTE — Progress Notes (Signed)
 Unable to complete admission hx at this time. Pt disoriented, on NRB and no family at bedside. Will re-asses as patient progresses.

## 2024-01-09 ENCOUNTER — Other Ambulatory Visit: Payer: Self-pay

## 2024-01-09 ENCOUNTER — Encounter (HOSPITAL_COMMUNITY): Payer: Self-pay | Admitting: Internal Medicine

## 2024-01-09 DIAGNOSIS — J9601 Acute respiratory failure with hypoxia: Secondary | ICD-10-CM

## 2024-01-09 DIAGNOSIS — I480 Paroxysmal atrial fibrillation: Secondary | ICD-10-CM | POA: Diagnosis not present

## 2024-01-09 DIAGNOSIS — J441 Chronic obstructive pulmonary disease with (acute) exacerbation: Secondary | ICD-10-CM

## 2024-01-09 DIAGNOSIS — Z89612 Acquired absence of left leg above knee: Secondary | ICD-10-CM

## 2024-01-09 DIAGNOSIS — I739 Peripheral vascular disease, unspecified: Secondary | ICD-10-CM

## 2024-01-09 DIAGNOSIS — I5033 Acute on chronic diastolic (congestive) heart failure: Secondary | ICD-10-CM | POA: Diagnosis not present

## 2024-01-09 LAB — BASIC METABOLIC PANEL WITH GFR
Anion gap: 11 (ref 5–15)
BUN: 20 mg/dL (ref 8–23)
CO2: 30 mmol/L (ref 22–32)
Calcium: 9.6 mg/dL (ref 8.9–10.3)
Chloride: 99 mmol/L (ref 98–111)
Creatinine, Ser: 1.07 mg/dL (ref 0.61–1.24)
GFR, Estimated: >60 mL/min
Glucose, Bld: 161 mg/dL — ABNORMAL HIGH (ref 70–99)
Potassium: 3.9 mmol/L (ref 3.5–5.1)
Sodium: 140 mmol/L (ref 135–145)

## 2024-01-09 LAB — CBC
HCT: 54.5 % — ABNORMAL HIGH (ref 39.0–52.0)
Hemoglobin: 16.5 g/dL (ref 13.0–17.0)
MCH: 27 pg (ref 26.0–34.0)
MCHC: 30.3 g/dL (ref 30.0–36.0)
MCV: 89.3 fL (ref 80.0–100.0)
Platelets: 223 K/uL (ref 150–400)
RBC: 6.1 MIL/uL — ABNORMAL HIGH (ref 4.22–5.81)
RDW: 15.9 % — ABNORMAL HIGH (ref 11.5–15.5)
WBC: 8.8 K/uL (ref 4.0–10.5)
nRBC: 0 % (ref 0.0–0.2)

## 2024-01-09 LAB — GLUCOSE, CAPILLARY
Glucose-Capillary: 157 mg/dL — ABNORMAL HIGH (ref 70–99)
Glucose-Capillary: 330 mg/dL — ABNORMAL HIGH (ref 70–99)
Glucose-Capillary: 227 mg/dL — ABNORMAL HIGH (ref 70–99)
Glucose-Capillary: 250 mg/dL — ABNORMAL HIGH (ref 70–99)

## 2024-01-09 MED ORDER — GUAIFENESIN-DM 100-10 MG/5ML PO SYRP
5.0000 mL | ORAL_SOLUTION | ORAL | Status: DC | PRN
  Administered 2024-01-09 – 2024-01-16 (×10): 5 mL via ORAL
  Filled 2024-01-09 (×11): qty 10

## 2024-01-09 NOTE — Progress Notes (Signed)
 Patient alert to self only. Frequently removes NRB mask. O2 sat drops to 80s on RA. Placed on HFNC @ 10 lpm. O2 sat at 92%. RT aware.

## 2024-01-09 NOTE — Progress Notes (Signed)
" °  Progress Note   Patient: Anthony [name]. FMW:[id number] DOB: 11/16/1950 DOA: 01/07/2024     2 DOS: the patient was seen and examined on 01/09/2024   Brief hospital course: 74 year old man PMH including COPD, CAD, PAF, presented with shortness of breath and confusion.  Found to be hypoxic in the low 80s.  Placed on BiPAP.  Admitted for acute hypoxic respiratory failure, CHF exacerbation.  Consultants Cardiology   Procedures/Events 12/30 Assessment and Plan: Acute hypoxic respiratory failure Acute on chronic diastolic CHF COPD exacerbation Left-sided pneumonia Influenza, RSV, COVID-negative.  Chest x-ray showed left basilar infiltrate.  Pulmonary edema. Presented hypoxic and tachypneic requiring BiPAP. Was on nonrebreather, weaned to high flow nasal cannula LVEF 50-55% Continue bronchodilators, steroids, antibiotics Continue diuresis as per cardiology.   Paroxysmal atrial fibrillation Has been in sinus rhythm.  Continue apixaban .  Could not tolerate beta-blocker because of hypotension in past.  Not a good candidate for alternative antiarrhythmics given chronic lung disease.  CAD status post RCA stent Hyperlipidemia Troponin flat, myocardial injury in the setting of heart failure  Continue statin.  No aspirin  with Eliquis .  Diabetes mellitus type 2 Hemoglobin A1c 7.8 CBG stable.  Holding metformin  and glipizide .  Continue sliding scale insulin .  Chronic pain Continue gabapentin   Mild cognitive impairment Stable.  Continue delirium precautions  PAD  Status post left AKA  Prolonged QTc Repeat EKG  Disposition From: [facility] To: Anticipate return to [facility] health    Subjective:  Still requiring high flow nasal cannula but comfortable per nursing  Patient feels well, denies complaints.  Seems to be breathing fine.  History probably unreliable.  Physical Exam: Vitals:   01/08/24 2050 01/08/24 2300 01/09/24 0817 01/09/24 0822  BP: 106/65      Pulse: 65     Resp: 18     Temp:      TempSrc:      SpO2: 92% 92% (!) 88% (!) 89%  Weight:      Height:       Physical Exam Vitals and nursing note reviewed.  Constitutional:      General: He is not in acute distress.    Appearance: He is not ill-appearing or toxic-appearing.     Comments: Appears calm, comfortable sitting in bed  Cardiovascular:     Rate and Rhythm: Normal rate and regular rhythm.     Heart sounds: No murmur heard. Pulmonary:     Effort: Pulmonary effort is normal. No respiratory distress.     Breath sounds: Wheezing and rhonchi present. No rales.  Musculoskeletal:     Right lower leg: No edema.     Comments: Left AKA  Neurological:     Mental Status: He is alert.  Psychiatric:        Mood and Affect: Mood normal.        Behavior: Behavior normal.     Data Reviewed: BMP stable BNP 740 on admission Troponins on admission 34, 32 Hemoglobin stable 16.5, no leukocytosis.  Platelets within normal limits.  Family Communication: none present  Disposition: Status is: Inpatient Remains inpatient appropriate because: CHF     Time spent: 35 minutes  Author: Toribio Door, MD 01/09/2024 9:34 AM  For on call review [email or web address]    "

## 2024-01-09 NOTE — Progress Notes (Signed)
"  °  Progress Note  Patient Name: Anthony [name]. Date of Encounter: 01/09/2024 [facility] Cardiologist: Oneil Parchment, MD   Interval Summary   Was alert to self only last night.  Removed nonrebreather mask.  Ate his full breakfast.  Appears comfortable.  Vital Signs Vitals:   01/08/24 2050 01/08/24 2300 01/09/24 0817 01/09/24 0822  BP: 106/65     Pulse: 65     Resp: 18     Temp:      TempSrc:      SpO2: 92% 92% (!) 88% (!) 89%  Weight:      Height:        Intake/Output Summary (Last 24 hours) at 01/09/2024 0851 Last data filed at 01/08/2024 2130 Gross per 24 hour  Intake 304.37 ml  Output 700 ml  Net -395.63 ml      01/08/2024    5:00 PM 08/15/2023    6:15 AM 08/14/2023    6:18 AM  Last 3 Weights  Weight (lbs) 187 lb 170 lb 13.7 oz 178 lb 3.9 oz  Weight (kg) 84.823 kg 77.5 kg 80.85 kg      Telemetry/ECG  Sinus rhythm 70s, no atrial fibrillation- Personally Reviewed  Physical Exam  GEN: No acute distress.   Neck: No JVD Cardiac: RRR, no murmurs, rubs, or gallops.  Respiratory: Clear to auscultation bilaterally. GI: Soft, nontender, non-distended  MS: Left AKA  Assessment & Plan   74 year old with acute diastolic heart failure EF 50 to 55% with acute hypoxic respiratory failure paroxysmal atrial fibrillation flutter coronary disease COPD exacerbation left AKA  Acute on chronic diastolic heart failure - IV Lasix  40 mg twice daily, would not be unreasonable to change to p.o. Lasix  tomorrow. -Creatinine 1.07 stable, potassium 3.4 -proBNP 740, mild edema on x-ray.  Takes Lasix  20 mg at home. -BP 106/65 stable.  No room for further ARNI  Paroxysmal atrial fibrillation flutter - Currently on sinus Eliquis  5 mg twice a day at home could not tolerate beta-blocker because of hypotension in the past.  Not a good candidate for alternative antiarrhythmics given chronic lung disease CAD current state of health.  CAD prior stent to RCA in 2012 in the setting of STEMI  with hyperlipidemia - Troponin flat 30s myocardial injury in the setting of heart failure - Continue with atorvastatin  40 mg a day - No aspirin  with Eliquis  use - Prior LDL 40.  PAD left AKA COPD exacerbation interstitial lung disease - On ceftriaxone  azithromycin  antibiotics.  Inhalers, prednisone  60  Cognitive impairment - This has been a challenge for him.    For questions or updates, please contact [facility] Please consult [email or web address] for contact info under         Signed, Oneil Parchment, MD   "

## 2024-01-09 NOTE — Hospital Course (Addendum)
 74 year old man PMH including COPD, CAD, PAF, presented with shortness of breath and confusion.  Found to be hypoxic in the low 80s.  Placed on BiPAP.  Admitted for acute hypoxic respiratory failure, CHF exacerbation, pneumonia, COPD.  Seen by pulmonology and cardiology.  Diuresed.  Treated for pneumonia.  Despite treatment continues to have high oxygen requirement although asymptomatic.  Consultants Cardiology   Procedures/Events 12/30

## 2024-01-10 ENCOUNTER — Telehealth (HOSPITAL_COMMUNITY): Payer: Self-pay | Admitting: Pharmacy Technician

## 2024-01-10 ENCOUNTER — Other Ambulatory Visit (HOSPITAL_COMMUNITY): Payer: Self-pay

## 2024-01-10 ENCOUNTER — Inpatient Hospital Stay (HOSPITAL_COMMUNITY)

## 2024-01-10 DIAGNOSIS — J441 Chronic obstructive pulmonary disease with (acute) exacerbation: Secondary | ICD-10-CM | POA: Diagnosis not present

## 2024-01-10 DIAGNOSIS — I5033 Acute on chronic diastolic (congestive) heart failure: Secondary | ICD-10-CM | POA: Diagnosis not present

## 2024-01-10 DIAGNOSIS — I480 Paroxysmal atrial fibrillation: Secondary | ICD-10-CM | POA: Diagnosis not present

## 2024-01-10 DIAGNOSIS — J9601 Acute respiratory failure with hypoxia: Secondary | ICD-10-CM | POA: Diagnosis not present

## 2024-01-10 LAB — BASIC METABOLIC PANEL WITH GFR
Anion gap: 12 (ref 5–15)
BUN: 28 mg/dL — ABNORMAL HIGH (ref 8–23)
CO2: 30 mmol/L (ref 22–32)
Calcium: 9.2 mg/dL (ref 8.9–10.3)
Chloride: 95 mmol/L — ABNORMAL LOW (ref 98–111)
Creatinine, Ser: 1.21 mg/dL (ref 0.61–1.24)
GFR, Estimated: >60 mL/min
Glucose, Bld: 262 mg/dL — ABNORMAL HIGH (ref 70–99)
Potassium: 4 mmol/L (ref 3.5–5.1)
Sodium: 136 mmol/L (ref 135–145)

## 2024-01-10 LAB — GLUCOSE, CAPILLARY
Glucose-Capillary: 232 mg/dL — ABNORMAL HIGH (ref 70–99)
Glucose-Capillary: 308 mg/dL — ABNORMAL HIGH (ref 70–99)
Glucose-Capillary: 282 mg/dL — ABNORMAL HIGH (ref 70–99)
Glucose-Capillary: 244 mg/dL — ABNORMAL HIGH (ref 70–99)

## 2024-01-10 MED ORDER — INSULIN GLARGINE 100 UNIT/ML ~~LOC~~ SOLN
5.0000 [IU] | Freq: Every day | SUBCUTANEOUS | Status: DC
  Administered 2024-01-10 – 2024-01-11 (×2): 5 [IU] via SUBCUTANEOUS
  Filled 2024-01-10 (×2): qty 0.05

## 2024-01-10 MED ORDER — FUROSEMIDE 40 MG PO TABS
40.0000 mg | ORAL_TABLET | Freq: Every day | ORAL | Status: DC
  Administered 2024-01-10 – 2024-01-18 (×9): 40 mg via ORAL
  Filled 2024-01-10 (×9): qty 1

## 2024-01-10 NOTE — Progress Notes (Addendum)
 "  Progress Note  Patient Name: Anthony [name]. Date of Encounter: 01/10/2024 [facility] Cardiologist: Anthony Parchment, MD   Interval Summary   Reports feeling good, says he never had any issues with breathing Confused  Presently on 15 L via HFNC  Vital Signs Vitals:   01/09/24 2027 01/09/24 2040 01/09/24 2102 01/10/24 0507  BP: 115/74   115/72  Pulse: 63   62  Resp: 16  (!) 24 18  Temp: 98.1 F (36.7 C)   98.1 F (36.7 C)  TempSrc:      SpO2: 93% 90%  91%  Weight:      Height:        Intake/Output Summary (Last 24 hours) at 01/10/2024 0922 Last data filed at 01/10/2024 0858 Gross per 24 hour  Intake 1355.64 ml  Output 2575 ml  Net -1219.36 ml      01/08/2024    5:00 PM 08/15/2023    6:15 AM 08/14/2023    6:18 AM  Last 3 Weights  Weight (lbs) 187 lb 170 lb 13.7 oz 178 lb 3.9 oz  Weight (kg) 84.823 kg 77.5 kg 80.85 kg     Telemetry/ECG  Sinus, HR 60s - Personally Reviewed  Physical Exam  GEN: No acute distress.   Neck: No JVD Cardiac: RRR, no murmurs, rubs, or gallops.  Respiratory: Clear to auscultation bilaterally. GI: Soft, nontender, non-distended  MS: No edema, left AKA   Assessment & Plan   Acute hypoxic respiratory failure Presented with [name], [facility] Noted to have SpO2 in low-80s on RA Placed on BiPAP ? NRB Underlying history of COPD and CHF Hemoglobin increased to 16.8 from 8-9s in August -- suspect hypoxia has been chronic  Continue to treat possible CHF exacerbation with treatment of underlying lung disease to primary team    Suspected acute on Chronic HFpEF Echo this admission: LVEF 50-55%, G1DD, normal RV ystolic function, normal IVC Home meds: PO Lasix  20 mg daily  proBNP 740 CXR showed mild edema, consolidation Started on IV Lasix   Patient unable to confirm good urine output/I&O's not charted  Creatinine 1.05 ? 1.20  Patient is poor historian, no one else present in room Patient denies any current chest pain He is presently on  NRB Suspect that shortness of breath is likely multifactorial in the setting of COPD, possible infection, CHF -- does not appear significantly volume overloaded Previously on IV Lasix , transition to PO 40 mg daily today, monitor response    Paroxysmal A-Fib/flutter Presently in sinus with rates 60-70s On Eliquis  5 mg BID at home  Previously on BB but did not tolerate with hypotension   With history of CAD, chronic lung disease, and current state of health, would not be a good candidate for alternative antiarrhythmics    CAD s/p STEMI 2012 with stent to RCA Hyperlipidemia  Troponin 34 ? 32  Home meds: Lipitor 40 mg daily, no ASA with Eliquis  use  07/27/2023: HDL 44; LDL Cholesterol 40 01/07/2024: ALT 10  Continue statin    Per primary Acute hypoxic respiratory failure Possible pneumonia COPD exacerbation Interstitial lung disease  AKI History of PAD s/p left AKA Acute encephalopathy Diabetes  For questions or updates, please contact [facility] Please consult [email or web address] for contact info under       Signed, Anthony DELENA Donath, PA-C   Personally seen and examined. Agree with above.  74 year old here with acute hypoxic respiratory failure with underlying COPD and possible acute on chronic diastolic heart failure EF  50 to 55%.  Creatinine has slightly increased from 1 up to 1.2.  We are transitioning to p.o. 40 mg Lasix  today.  It is likely that his shortness of breath is multifactorial with COPD leading the way.  Left AKA noted.  Mild wheeze on exam.  Confused.  Anthony Parchment, MD  "

## 2024-01-10 NOTE — Telephone Encounter (Signed)
 Patient Product/process Development Scientist completed.    The patient is insured through [employer]. Patient has Medicare and is not eligible for a copay card, but may be able to apply for patient assistance or Medicare RX Payment Plan (Patient Must reach out to their plan, if eligible for payment plan), if available.    Ran test claim for Eliquis  5 mg and the current 30 day co-pay is $0.00.   This test claim was processed through [employer]- copay amounts may vary at other pharmacies due to [employer], or as the patient moves through the different stages of their insurance plan.     Reyes Sharps, CPHT Pharmacy Technician Patient Advocate Specialist Lead [facility] Patient Advocate Team Direct Number: [phone]  Fax: [phone]

## 2024-01-10 NOTE — Progress Notes (Signed)
" °  Progress Note   Patient: Anthony [name]. FMW:[id number] DOB: 23-Jul-1950 DOA: 01/07/2024     3 DOS: the patient was seen and examined on 01/10/2024   Brief hospital course: 74 year old man PMH including COPD, CAD, PAF, presented with shortness of breath and confusion.  Found to be hypoxic in the low 80s.  Placed on BiPAP.  Admitted for acute hypoxic respiratory failure, CHF exacerbation.  Consultants Cardiology   Procedures/Events 12/30  Assessment and Plan: Acute hypoxic respiratory failure Acute on chronic diastolic CHF COPD exacerbation Left-sided pneumonia Influenza, RSV, COVID-negative.  Chest x-ray showed left basilar infiltrate.  Pulmonary edema. Presented hypoxic and tachypneic requiring BiPAP. Was on nonrebreather, weaned to high flow nasal cannula LVEF 50-55% Continues to have high flow oxygen requirement.  Continue bronchodilators, steroids, antibiotics. Continue diuresis as per cardiology, now on oral therapy. Given ongoing requirement, check CT chest to rule out complicating feature.  His degree of hypoxia is out of proportion to his clinical appearance.   Paroxysmal atrial fibrillation Has been in sinus rhythm.  Continue apixaban .  Could not tolerate beta-blocker because of hypotension in past.  Not a good candidate for alternative antiarrhythmics given chronic lung disease.   CAD status post RCA stent Hyperlipidemia Troponin flat, myocardial injury in the setting of heart failure  Continue statin.  No aspirin  with Eliquis .   Diabetes mellitus type 2 Hemoglobin A1c 7.8 CBG high.  Holding metformin  and glipizide .  Continue sliding scale insulin .  Add low-dose long-acting insulin .   Chronic pain Continue gabapentin    Mild cognitive impairment Stable.  Continue delirium precautions   PAD  Status post left AKA   Prolonged QTc, resolved Repeat EKG 1/1 independently reviewed, QTc within normal limits   Disposition From: [facility] To:  Anticipate return to [facility] health      Subjective:  Feels fine today, no complaints  Physical Exam: Vitals:   01/09/24 2027 01/09/24 2040 01/09/24 2102 01/10/24 0507  BP: 115/74   115/72  Pulse: 63   62  Resp: 16  (!) 24 18  Temp: 98.1 F (36.7 C)   98.1 F (36.7 C)  TempSrc:      SpO2: 93% 90%  91%  Weight:      Height:       Physical Exam Vitals reviewed.  Constitutional:      General: He is not in acute distress.    Appearance: He is not ill-appearing or toxic-appearing.  Cardiovascular:     Rate and Rhythm: Normal rate and regular rhythm.     Heart sounds: No murmur heard. Pulmonary:     Effort: Pulmonary effort is normal. No respiratory distress.     Breath sounds: No wheezing, rhonchi or rales.  Neurological:     Mental Status: He is alert.  Psychiatric:        Mood and Affect: Mood normal.        Behavior: Behavior normal.     Data Reviewed: CBG high 200-300s BMP noted  Family Communication: none  Disposition: Status is: Inpatient Remains inpatient appropriate because: hypoxia     Time spent: 35 minutes  Author: Toribio Door, MD 01/10/2024 8:40 AM  For on call review [email or web address]    "

## 2024-01-10 NOTE — Inpatient Diabetes Management (Signed)
 Inpatient Diabetes Program Recommendations  AACE/ADA: New Consensus Statement on Inpatient Glycemic Control (2015)  Target Ranges:  Prepandial:   less than 140 mg/dL      Peak postprandial:   less than 180 mg/dL (1-2 hours)      Critically ill patients:  140 - 180 mg/dL   Lab Results  Component Value Date   GLUCAP 232 (H) 01/10/2024   HGBA1C 7.8 (H) 01/07/2024    Review of Glycemic Control  Latest Reference Range & Units 01/09/24 07:46 01/09/24 11:35 01/09/24 16:19 01/09/24 20:26 01/10/24 07:53  Glucose-Capillary 70 - 99 mg/dL 842 (H) 669 (H) 772 (H) 250 (H) 232 (H)   Diabetes history: DM 2 Outpatient Diabetes medications: Glipizide  10 mg Daily, metformin  1000 mg Daily, Rybelsus  7 mg Daily Current orders for Inpatient glycemic control:  Novolog  0-9 units tid + hs Lantus  5 units (added today 1/2) PO Prednisone  60 mg Daily 7.8% on 12/30 Note: postprandial glucose trends increase after steroid dose and PO intake  Inpatient Diabetes Program Recommendations:    If postprandial glucose trends continue to increase after steroid dose after initiation of Lantus  may consider:  -   Adding Novolog  2 units tid meal coverage if eating >50% of meals  Thanks,  Clotilda Bull RN, MSN, BC-ADM Inpatient Diabetes Coordinator Team Pager [phone] (8a-5p)

## 2024-01-10 NOTE — Plan of Care (Signed)
 Patient alert, restless in bed and awake throughout the night. Patient continues on 15L HFNC, no s/s of respiratory distress noted. Denies pain. Will continue plan of care.  Problem: Coping: Goal: Ability to adjust to condition or change in health will improve Outcome: Progressing   Problem: Activity: Goal: Risk for activity intolerance will decrease Outcome: Progressing   Problem: Safety: Goal: Ability to remain free from injury will improve Outcome: Progressing

## 2024-01-11 DIAGNOSIS — I480 Paroxysmal atrial fibrillation: Secondary | ICD-10-CM | POA: Diagnosis not present

## 2024-01-11 DIAGNOSIS — E785 Hyperlipidemia, unspecified: Secondary | ICD-10-CM

## 2024-01-11 DIAGNOSIS — I5033 Acute on chronic diastolic (congestive) heart failure: Secondary | ICD-10-CM | POA: Diagnosis not present

## 2024-01-11 DIAGNOSIS — J441 Chronic obstructive pulmonary disease with (acute) exacerbation: Secondary | ICD-10-CM | POA: Diagnosis not present

## 2024-01-11 DIAGNOSIS — E1169 Type 2 diabetes mellitus with other specified complication: Secondary | ICD-10-CM

## 2024-01-11 DIAGNOSIS — J9601 Acute respiratory failure with hypoxia: Secondary | ICD-10-CM | POA: Diagnosis not present

## 2024-01-11 LAB — GLUCOSE, CAPILLARY
Glucose-Capillary: 256 mg/dL — ABNORMAL HIGH (ref 70–99)
Glucose-Capillary: 268 mg/dL — ABNORMAL HIGH (ref 70–99)
Glucose-Capillary: 254 mg/dL — ABNORMAL HIGH (ref 70–99)
Glucose-Capillary: 304 mg/dL — ABNORMAL HIGH (ref 70–99)

## 2024-01-11 MED ORDER — VITAMIN C 500 MG PO TABS
500.0000 mg | ORAL_TABLET | Freq: Every day | ORAL | Status: DC
  Administered 2024-01-11 – 2024-01-18 (×8): 500 mg via ORAL
  Filled 2024-01-11 (×8): qty 1

## 2024-01-11 MED ORDER — MELATONIN 3 MG PO TABS
3.0000 mg | ORAL_TABLET | Freq: Every day | ORAL | Status: DC
  Administered 2024-01-11 – 2024-01-17 (×7): 3 mg via ORAL
  Filled 2024-01-11 (×7): qty 1

## 2024-01-11 MED ORDER — OLOPATADINE HCL 0.1 % OP SOLN
1.0000 [drp] | Freq: Two times a day (BID) | OPHTHALMIC | Status: DC
  Administered 2024-01-11 – 2024-01-18 (×15): 1 [drp] via OPHTHALMIC
  Filled 2024-01-11: qty 5

## 2024-01-11 MED ORDER — INSULIN GLARGINE 100 UNIT/ML ~~LOC~~ SOLN
10.0000 [IU] | Freq: Every day | SUBCUTANEOUS | Status: DC
  Administered 2024-01-12 – 2024-01-13 (×2): 10 [IU] via SUBCUTANEOUS
  Filled 2024-01-11 (×2): qty 0.1

## 2024-01-11 MED ORDER — POLYVINYL ALCOHOL 1.4 % OP SOLN
1.0000 [drp] | OPHTHALMIC | Status: DC | PRN
  Administered 2024-01-15: 1 [drp] via OPHTHALMIC
  Filled 2024-01-11: qty 15

## 2024-01-11 NOTE — Progress Notes (Signed)
 " Progress Note   Patient: Anthony [name]. FMW:[id number] DOB: [birth year] DOA: 01/07/2024     4 DOS: the patient was seen and examined on 01/11/2024   Brief hospital course: 74 year old man PMH including COPD, CAD, PAF, presented with shortness of breath and confusion.  Found to be hypoxic in the low 80s.  Placed on BiPAP.  Admitted for acute hypoxic respiratory failure, CHF exacerbation.  Consultants Cardiology   Procedures/Events 12/30  Assessment and Plan: Acute hypoxic respiratory failure Acute on chronic diastolic CHF COPD exacerbation Left-sided pneumonia Influenza, RSV, COVID-negative.  Chest x-ray showed left basilar infiltrate.  Pulmonary edema.   Presented hypoxic and tachypneic requiring BiPAP. Was on nonrebreather, weaned to high flow nasal cannula LVEF 50-55% Follow-up CT chest 1/2 showed no acute abnormality, but did show severe emphysema Continues to have high oxygen requirement, unable to wean but no distress and appears, comfortable. Continue oral diuretic, bronchodilators, antibiotics   Paroxysmal atrial fibrillation Has been in sinus rhythm.  Continue apixaban .  Could not tolerate beta-blocker because of hypotension in past.  Not a good candidate for alternative antiarrhythmics given chronic lung disease. Stable   CAD status post RCA stent Hyperlipidemia Troponin flat, myocardial injury in the setting of heart failure  Continue statin.  No aspirin  with Eliquis .   Diabetes mellitus type 2 Hemoglobin A1c 7.8 CBG remains high.  Holding metformin  and glipizide .  Continue sliding scale insulin .  Will increase long-acting insulin .   Chronic pain Continue gabapentin    Mild cognitive impairment Stable.  Continue delirium precautions   PAD  Status post left AKA   Prolonged QTc, resolved Repeat EKG 1/1 independently reviewed, QTc within normal limits   Disposition From: [facility] To: Anticipate return to [facility]      Subjective:   Breathing fine Complains of right eye itchiness.  This comes and goes and has been treated with an eye ointment in the past which helped.  He does not recall the name of that.  Does report vision is about usual.  Physical Exam: Vitals:   01/10/24 2105 01/10/24 2133 01/11/24 0447 01/11/24 1332  BP: (!) 146/90  116/69 119/63  Pulse: 72  64 72  Resp: 20  20 18   Temp: 97.7 F (36.5 C)  98.3 F (36.8 C) (!) 97.5 F (36.4 C)  TempSrc: Oral  Oral Oral  SpO2: 96% 92% 96% 90%  Weight:      Height:       Physical Exam Vitals reviewed.  Constitutional:      General: He is not in acute distress.    Appearance: He is not ill-appearing or toxic-appearing.  Eyes:     Comments: There is some redness of the right eye which appears irritated.  No exudate.  Cardiovascular:     Rate and Rhythm: Normal rate and regular rhythm.     Heart sounds: No murmur heard. Pulmonary:     Effort: Pulmonary effort is normal. No respiratory distress.     Breath sounds: No wheezing, rhonchi or rales.  Neurological:     Mental Status: He is alert.  Psychiatric:        Mood and Affect: Mood normal.        Behavior: Behavior normal.     Data Reviewed: Blood sugars remain elevated.  Family Communication: none  Disposition: Status is: Inpatient Remains inpatient appropriate because: hypoxia     Time spent: 35 minutes  Author: Toribio Door, MD 01/11/2024 2:33 PM  For on call review  [email or web address]    "

## 2024-01-11 NOTE — Progress Notes (Signed)
 "  Rounding Note   Patient Name: Anthony [name]. Date of Encounter: 01/11/2024  [facility] Cardiologist: Oneil Parchment, MD   Subjective - No acute events overnight - The patient is very alert today and denies having any chest pain, SOB, palpitations, swelling  Scheduled Meds:  apixaban   5 mg Oral BID   arformoterol   15 mcg Nebulization BID   And   umeclidinium bromide   1 puff Inhalation Daily   atorvastatin   40 mg Oral Daily   azithromycin   500 mg Oral Daily   escitalopram   10 mg Oral QHS   furosemide   40 mg Oral Daily   gabapentin   100 mg Oral BID   insulin  aspart  0-9 Units Subcutaneous TID WC   insulin  glargine  5 Units Subcutaneous Daily   predniSONE   60 mg Oral Q breakfast   traZODone   200 mg Oral QHS   Continuous Infusions:  cefTRIAXone  (ROCEPHIN )  IV 1 g (01/10/24 1822)   PRN Meds: acetaminophen  **OR** acetaminophen , guaiFENesin -dextromethorphan , ipratropium-albuterol , oxyCODONE    Vital Signs  Vitals:   01/10/24 2105 01/10/24 2105 01/10/24 2133 01/11/24 0447  BP:  (!) 146/90  116/69  Pulse:  72  64  Resp: 18 20  20   Temp:  97.7 F (36.5 C)  98.3 F (36.8 C)  TempSrc:  Oral  Oral  SpO2:  96% 92% 96%  Weight:      Height:        Intake/Output Summary (Last 24 hours) at 01/11/2024 0722 Last data filed at 01/11/2024 0353 Gross per 24 hour  Intake 420 ml  Output 1150 ml  Net -730 ml      01/08/2024    5:00 PM 08/15/2023    6:15 AM 08/14/2023    6:18 AM  Last 3 Weights  Weight (lbs) 187 lb 170 lb 13.7 oz 178 lb 3.9 oz  Weight (kg) 84.823 kg 77.5 kg 80.85 kg      Telemetry Sinus bradycardia- Personally Reviewed  ECG  No new ECG  Physical Exam  GEN: No acute distress, very pleasant, laying in bed wearing oxygen Neck: No JVD Cardiac: RRR, no murmurs, rubs, or gallops.  Respiratory: Some wheezing and rhonchi heard throughout, poor air exchange, no rales GI: Soft, nontender, non-distended  MS: No edema; No deformity. Neuro:  Nonfocal   Psych: Normal affect   Labs High Sensitivity Troponin:  No results for input(s): TROPONINIHS in the last 720 hours.  Recent Labs  Lab 01/07/24 0045 01/07/24 0225  TRNPT 34* 32*       Chemistry Recent Labs  Lab 01/07/24 0045 01/08/24 0500 01/09/24 0542 01/10/24 0527  NA 140 142 140 136  K 4.4 4.1 3.9 4.0  CL 104 101 99 95*  CO2 26 32 30 30  GLUCOSE 236* 182* 161* 262*  BUN 17 17 20  28*  CREATININE 1.05 1.20 1.07 1.21  CALCIUM  9.3 9.6 9.6 9.2  PROT 7.0  --   --   --   ALBUMIN  4.0  --   --   --   AST 13*  --   --   --   ALT 10  --   --   --   ALKPHOS 62  --   --   --   BILITOT 0.3  --   --   --   GFRNONAA >60 >60 >60 >60  ANIONGAP 10 9 11 12     Lipids No results for input(s): CHOL, TRIG, HDL, LABVLDL, LDLCALC, CHOLHDL in the last  168 hours.  Hematology Recent Labs  Lab 01/07/24 0045 01/08/24 0500 01/09/24 0542  WBC 8.0 9.6 8.8  RBC 6.05* 6.25* 6.10*  HGB 16.3 16.8 16.5  HCT 54.6* 57.3* 54.5*  MCV 90.2 91.7 89.3  MCH 26.9 26.9 27.0  MCHC 29.9* 29.3* 30.3  RDW 17.0* 17.0* 15.9*  PLT 234 231 223   Thyroid  No results for input(s): TSH, FREET4 in the last 168 hours.  BNP Recent Labs  Lab 01/07/24 0045  PROBNP 740.0*    DDimer No results for input(s): DDIMER in the last 168 hours.   Radiology  CT CHEST WO CONTRAST Result Date: 01/11/2024 EXAM: CT CHEST WITHOUT CONTRAST 01/10/2024 03:31:00 PM TECHNIQUE: CT of the chest was performed without the administration of intravenous contrast. Multiplanar reformatted images are provided for review. Automated exposure control, iterative reconstruction, and/or weight based adjustment of the mA/kV was utilized to reduce the radiation dose to as low as reasonably achievable. COMPARISON: Chest radiograph 01/07/2024. CLINICAL HISTORY: Pneumonia, complication suspected, xray done; high oxygen requirement, not improving, assess for complication. FINDINGS: MEDIASTINUM: Aortic atherosclerosis and multivessel  coronary artery calcifications. The central airways are clear. LYMPH NODES: No mediastinal, hilar or axillary lymphadenopathy. LUNGS AND PLEURA: Emphysema, severe. Diffuse bronchial wall thickening. Interstitial edema or consolidative change. No pleural fluid. No pneumothorax identified. Subpleural nodule within the paramediastinal region and right upper lobe measures 1.1 x 0.8 cm (mean diameter 9 mm), image 59/6. Unchanged compared 07/15/2018. The superior segment left lower lobe lung nodule measures 4 mm and is also unchanged from 2020. Additional small lung nodules are also stable in the interval. SOFT TISSUES/BONES: Mild multilevel endplate degenerative changes within the thoracic spine. UPPER ABDOMEN: Common bile duct dilatation is identified measuring up to 1.7 cm, previously 1.3 cm. Intrahepatic bile duct dilatation is noted. The distal common bile duct is not visualized. IMPRESSION: 1. No acute intrathoracic abnormality, without pleural effusion, pneumothorax, or focal consolidation. 2. Severe emphysema with diffuse bronchial wall thickening. 3. Scattered bilateral lung nodules, stable from 07/15/2018, compatible with a benign process. 4. Aortic atherosclerosis and coronary artery calcifications. Electronically signed by: Waddell Calk MD 01/11/2024 05:44 AM EST RP Workstation: [id number]    Cardiac Studies   TTE 01/07/24:  IMPRESSIONS     1. Left ventricular ejection fraction, by estimation, is 50 to 55%. The  left ventricle has low normal function. The left ventricle has no regional  wall motion abnormalities. There is mild concentric left ventricular  hypertrophy. Left ventricular  diastolic parameters are consistent with Grade I diastolic dysfunction  (impaired relaxation).   2. Right ventricular systolic function is normal. The right ventricular  size is normal.   3. The mitral valve is normal in structure. No evidence of mitral valve  regurgitation. No evidence of mitral stenosis.    4. The aortic valve is normal in structure. Aortic valve regurgitation is  not visualized. No aortic stenosis is present.   5. The inferior vena cava is normal in size with greater than 50%  respiratory variability, suggesting right atrial pressure of 3 mmHg.   Patient Profile   Anthony [name]. is a 74 y.o. male with a hx of CAD s/p inferior STEMI 06/2010 treated with BMS to the mid RCA, had moderate nonobstructive disease in the LAD, inferior hypokinesis EF 50%, paroxysmal atrial fibrillation, history of recurrent DVT in 2008 & 2014, COPD on chronic 3 L, ILD, PAD s/p left AKA, tobacco abuse, HLD, bipolar disorder, paroxysmal A-Fib who is being seen 01/08/2024 for the  evaluation of CHF at the request of Dr. Darci.   Assessment & Plan   #Acute Hypoxic Respiratory Failure 2/2 Acute on Chronic COPD Exacerbation #Chronic HFpEF - Patient originally admitted for severe acute hypoxic respiratory failure and cardiology was consulted given that the patient has a history of HFpEF. - I have carefully reviewed all the patient's imaging and lab data, and there is not a compelling argument for decompensated CHF in this patient. - His BNP was minimally elevated on admission, chest imaging showed mild pulmonary edema with no pulmonary edema on CT imaging performed yesterday and the patient has no clinical volume overload. - I believe that his acute hypoxic respiratory failure is being driven primarily by his severe emphysema and ILD. - He was given some diuresis while inpatient and certainly appears euvolemic today. - Will plan to restart his home Lasix  at double the dose anticipating that he will retain some additional fluid in the setting of getting steroid treatment for COPD exacerbation. - Otherwise no additional cardiovascular workup/intervention is required at this time. Start p.o. Lasix  40 mg daily (original home dose 20 mg daily) Agree with treatment for COPD exacerbation per primary  team Cardiology will sign off  #CAD s/p PCI #PAF #HLD Continue Eliquis  5 mg twice daily Continue atorvastatin  40 mg daily    [facility] will sign off.   Medication Recommendations:  INCREASE home Lasix  to 40 mg daily Other recommendations (labs, testing, etc):  N/A Follow up as an outpatient: Cardiology will arrange cardiology follow-up For questions or updates, please contact [facility] Please consult [email or web address] for contact info under       Signed, Georganna Archer, MD  01/11/2024, 7:22 AM    "

## 2024-01-11 NOTE — Plan of Care (Signed)
 Patient alert, restless in bed and awake throughout the night. Patient removed IV and repeatedly removed telemetry monitor and attempted to get out of bed multiple times. Patient was instructed several times not to remove medical devices, however patient non compliant. Safety precautions were implemented. Patient continues on 14L HFNC, no s/s of respiratory distress noted. Denies pain. Will continue plan of care.   Problem: Coping: Goal: Ability to adjust to condition or change in health will improve Outcome: Progressing   Problem: Health Behavior/Discharge Planning: Goal: Ability to identify and utilize available resources and services will improve Outcome: Progressing   Problem: Health Behavior/Discharge Planning: Goal: Ability to manage health-related needs will improve Outcome: Progressing

## 2024-01-12 DIAGNOSIS — I5040 Unspecified combined systolic (congestive) and diastolic (congestive) heart failure: Secondary | ICD-10-CM | POA: Diagnosis not present

## 2024-01-12 DIAGNOSIS — I480 Paroxysmal atrial fibrillation: Secondary | ICD-10-CM | POA: Diagnosis not present

## 2024-01-12 DIAGNOSIS — I5033 Acute on chronic diastolic (congestive) heart failure: Secondary | ICD-10-CM | POA: Diagnosis not present

## 2024-01-12 DIAGNOSIS — J849 Interstitial pulmonary disease, unspecified: Secondary | ICD-10-CM | POA: Diagnosis not present

## 2024-01-12 DIAGNOSIS — J439 Emphysema, unspecified: Secondary | ICD-10-CM | POA: Diagnosis not present

## 2024-01-12 DIAGNOSIS — I110 Hypertensive heart disease with heart failure: Secondary | ICD-10-CM

## 2024-01-12 DIAGNOSIS — Z87891 Personal history of nicotine dependence: Secondary | ICD-10-CM

## 2024-01-12 DIAGNOSIS — E1169 Type 2 diabetes mellitus with other specified complication: Secondary | ICD-10-CM | POA: Diagnosis not present

## 2024-01-12 DIAGNOSIS — J9621 Acute and chronic respiratory failure with hypoxia: Secondary | ICD-10-CM

## 2024-01-12 DIAGNOSIS — I4891 Unspecified atrial fibrillation: Secondary | ICD-10-CM

## 2024-01-12 DIAGNOSIS — J9601 Acute respiratory failure with hypoxia: Secondary | ICD-10-CM | POA: Diagnosis not present

## 2024-01-12 DIAGNOSIS — E785 Hyperlipidemia, unspecified: Secondary | ICD-10-CM | POA: Diagnosis not present

## 2024-01-12 DIAGNOSIS — I2510 Atherosclerotic heart disease of native coronary artery without angina pectoris: Secondary | ICD-10-CM

## 2024-01-12 DIAGNOSIS — J441 Chronic obstructive pulmonary disease with (acute) exacerbation: Secondary | ICD-10-CM | POA: Diagnosis not present

## 2024-01-12 LAB — GLUCOSE, CAPILLARY
Glucose-Capillary: 149 mg/dL — ABNORMAL HIGH (ref 70–99)
Glucose-Capillary: 263 mg/dL — ABNORMAL HIGH (ref 70–99)
Glucose-Capillary: 382 mg/dL — ABNORMAL HIGH (ref 70–99)
Glucose-Capillary: 320 mg/dL — ABNORMAL HIGH (ref 70–99)

## 2024-01-12 LAB — D-DIMER, QUANTITATIVE: D-Dimer, Quant: 1.34 ug{FEU}/mL — ABNORMAL HIGH (ref 0.00–0.50)

## 2024-01-12 MED ORDER — ERYTHROMYCIN 5 MG/GM OP OINT
TOPICAL_OINTMENT | Freq: Three times a day (TID) | OPHTHALMIC | Status: DC
  Administered 2024-01-16 – 2024-01-18 (×3): 1 via OPHTHALMIC
  Filled 2024-01-12: qty 3.5

## 2024-01-12 MED ORDER — ERYTHROMYCIN 5 MG/GM OP OINT
TOPICAL_OINTMENT | Freq: Three times a day (TID) | OPHTHALMIC | Status: DC
  Filled 2024-01-12: qty 1

## 2024-01-12 MED ORDER — IPRATROPIUM-ALBUTEROL 0.5-2.5 (3) MG/3ML IN SOLN
3.0000 mL | Freq: Three times a day (TID) | RESPIRATORY_TRACT | Status: DC
  Administered 2024-01-12 – 2024-01-18 (×19): 3 mL via RESPIRATORY_TRACT
  Filled 2024-01-12 (×19): qty 3

## 2024-01-12 NOTE — Progress Notes (Signed)
" °  Progress Note   Patient: Anthony [name]. FMW:[id number] DOB: 09-04-1950 DOA: 01/07/2024     5 DOS: the patient was seen and examined on 01/12/2024   Brief hospital course: 73 year old man PMH including COPD, CAD, PAF, presented with shortness of breath and confusion.  Found to be hypoxic in the low 80s.  Placed on BiPAP.  Admitted for acute hypoxic respiratory failure, CHF exacerbation.  Consultants Cardiology   Procedures/Events 12/30  Assessment and Plan: Acute on chronic hypoxic respiratory failure Acute on chronic diastolic CHF COPD exacerbation Left-sided pneumonia Influenza, RSV, COVID-negative.  Chest x-ray showed left basilar infiltrate.  Pulmonary edema.   Presented hypoxic and tachypneic requiring BiPAP. Was on nonrebreather, weaned to high flow nasal cannula LVEF 50-55% Follow-up CT chest 1/2 showed no acute abnormality, but did show severe emphysema Continues to have high oxygen requirement, unable to wean but no distress and appears, comfortable. Continue oral diuretic, bronchodilators, antibiotics  Bilateral emphysema ILD Chronic hypoxic respiratory failure Multiple lung nodules PMH 65-pack-year Followed by Dr. Shelah, last seen 2023, thought to be poor candidate for antifibrotic's or biopsy. Pulmonary medicine consultation for further recommendations   Paroxysmal atrial fibrillation Has been in sinus rhythm.  Continue apixaban .  Could not tolerate beta-blocker because of hypotension in past.  Not a good candidate for alternative antiarrhythmics given chronic lung disease. Stable   CAD status post RCA stent Hyperlipidemia Troponin flat, myocardial injury in the setting of heart failure  Continue statin.  No aspirin  with Eliquis .   Diabetes mellitus type 2 Hemoglobin A1c 7.8 CBG remains high.  Holding metformin  and glipizide .  Continue sliding scale insulin .  Continue long-acting insulin  without change today.   Chronic pain Continue gabapentin    Mild  cognitive impairment Stable.  Continue delirium precautions   PAD  Status post left AKA   Prolonged QTc, resolved Repeat EKG 1/1 independently reviewed, QTc within normal limits   Disposition From: [facility] To: Anticipate return to [facility] health     Subjective:  Feels fine today, per nursing desats immediately off oxygen.  No opportunity to wean.  Physical Exam: Vitals:   01/11/24 1737 01/11/24 2108 01/11/24 2143 01/12/24 0642  BP:  (!) 148/94  (!) 154/85  Pulse:  86  (!) 54  Resp:  20  20  Temp:  97.7 F (36.5 C)  97.8 F (36.6 C)  TempSrc:  Oral  Oral  SpO2: 90% 94% 93% 94%  Weight:      Height:       Physical Exam Constitutional:      General: He is not in acute distress.    Appearance: He is not ill-appearing or toxic-appearing.  Cardiovascular:     Rate and Rhythm: Normal rate and regular rhythm.     Heart sounds: No murmur heard. Pulmonary:     Effort: Pulmonary effort is normal. No respiratory distress.     Breath sounds: No wheezing, rhonchi or rales.  Neurological:     Mental Status: He is alert.  Psychiatric:        Mood and Affect: Mood normal.        Behavior: Behavior normal.     Data Reviewed: Fasting blood sugar 149, CBG still high  Family Communication: none   Disposition: Status is: Inpatient Remains inpatient appropriate because: high oxygen requirement     Time spent: 20 minutes  Author: Toribio Door, MD 01/12/2024 8:15 AM  For on call review [email or web address].    "

## 2024-01-12 NOTE — Progress Notes (Signed)
 Darryle Long [address] [facility] Liaison Note    Received request from [facility], for hospice services at facility after discharge. [facility] Liaison called  and left message for [name] to initiate education related to hospice philosophy, services, and team approach to care. Daughter has not called back. [facility] to follow up on tomorrow.    Above information shared with MD and ICM.    Please call with any questions or concerns.    Thank you for the opportunity to participate in this patients care.      Nat Babe, BSN, [facility] Liaison [phone]

## 2024-01-12 NOTE — TOC Initial Note (Signed)
 Transition of Care (TOC) - Initial/Assessment Note    Patient Details  Name: Anthony [name]. MRN: [id number] Date of Birth: 1950-05-28  Transition of [facility]) CM/SW Contact:    Hoy DELENA Bigness, LCSW Phone Number: 01/12/2024, 2:23 PM  Clinical Narrative:                 Spoke with pt's daughter, Cena who shares pt is a LTC resident at [facility] and plans for pt to return at discharge. Pt is mobile with a wheelchair and is supposed to be on O2 at baseline however, is noncompliant. Daughter shares that pt was supposed to discharge on hospice last admit in 08/2023 however, she never received call from [facility] to complete intake. CSW reached out to [facility] rep, Nat Babe who shares that on their end it shows that services were declined. Nat to reach out to daughter for new hospice referral. ICM will continue to follow.   Expected Discharge Plan: Long Term Nursing Home Barriers to Discharge: Continued Medical Work up   Patient Goals and CMS Choice Patient states their goals for this hospitalization and ongoing recovery are:: For pt to return to [facility] with Hospice CMS Medicare.gov Compare Post Acute Care list provided to:: Patient Represenative (must comment) Choice offered to / list presented to : Adult Children      Expected Discharge Plan and Services In-house Referral: Clinical Social Work Discharge Planning Services: NA Post Acute Care Choice: Nursing Home, Hospice Living arrangements for the past 2 months: Skilled Nursing Facility                                      Prior Living Arrangements/Services Living arrangements for the past 2 months: Skilled Nursing Facility Lives with:: Facility Resident Patient language and need for interpreter reviewed:: Yes Do you feel safe going back to the place where you live?: Yes      Need for Family Participation in Patient Care: Yes (Comment) Care giver support system in place?: Yes  (comment) Current home services: DME (wheelchair, oxygen) Criminal Activity/Legal Involvement Pertinent to Current Situation/Hospitalization: No - Comment as needed  Activities of Daily Living   ADL Screening (condition at time of admission) Independently performs ADLs?: Yes (appropriate for developmental age) Is the patient deaf or have difficulty hearing?: No Does the patient have difficulty seeing, even when wearing glasses/contacts?: No Does the patient have difficulty concentrating, remembering, or making decisions?: Yes  Permission Sought/Granted Permission sought to share information with : Facility Medical Sales Representative, Family Supports Permission granted to share information with : Yes, Verbal Permission Granted              Emotional Assessment   Attitude/Demeanor/Rapport: Unable to Assess Affect (typically observed): Unable to Assess Orientation: : Oriented to Self, Oriented to Place Alcohol  / Substance Use: Not Applicable Psych Involvement: No (comment)  Admission diagnosis:  Somnolence [R40.0] CHF exacerbation (HCC) [I50.9] Acute respiratory failure with hypoxia (HCC) [J96.01] Patient Active Problem List   Diagnosis Date Noted   S/P AKA (above knee amputation), left (HCC) 01/09/2024   CHF exacerbation (HCC) 01/07/2024   Chronic anemia 08/15/2023   Zoster 08/14/2023   AKI (acute kidney injury) 08/14/2023   Acute metabolic encephalopathy 08/14/2023   Typical atrial flutter (HCC) 08/02/2023   Paroxysmal atrial fibrillation (HCC) 08/02/2023   CAP (community acquired pneumonia) 07/29/2023   Acute on chronic diastolic CHF (congestive heart failure) (HCC)  07/26/2023   Acute respiratory failure with hypoxia (HCC) 07/26/2023   ILD (interstitial lung disease) (HCC) 03/28/2021   Paroxysmal atrial fibrillation with RVR (HCC) 02/21/2021   Pressure injury of skin 02/13/2021   Rib fractures 02/13/2021   History of DVT (deep vein thrombosis) 02/13/2021   HTN  (hypertension) 02/13/2021   Chronic respiratory failure with hypoxia (HCC) 02/12/2021   Hypokalemia 02/12/2021   Bipolar 1 disorder (HCC) 02/12/2021   Right flank pain 11/01/2019   Fall against object 09/25/2019   Cerebellar ataxia in diseases classified elsewhere (HCC) 08/26/2019   Chronic pain of right knee 07/14/2019   Chronic constipation 03/24/2019   Spinal stenosis of lumbar region with neurogenic claudication 11/14/2018   Vitamin D  deficiency 09/05/2018   Elevated vitamin B12 level 09/05/2018   Acute neutrophilia 09/05/2018   Chronic fatigue 08/29/2018   Generalized weakness 08/29/2018   Multiple lung nodules 08/03/2018   Insomnia 03/26/2017   Aortic atherosclerosis 10/19/2016   Pain medication agreement signed 06/05/2016   DM2 (diabetes mellitus, type 2) (HCC) 05/25/2016   Obesity (BMI 30-39.9) 04/13/2016   Rectal bleeding 11/23/2015   Moderate episode of recurrent major depressive disorder (HCC) 10/18/2015   S/P right THA, AA 05/10/2015   Recurrent left inguinal hernia 07/26/2014   Chronic right hip pain 06/23/2014   S/P bilateral inguinal hernia repair 10/30/2013   Bilateral inguinal hernia 05/28/2013   Umbilical hernia 05/28/2013   Mood disorder (HCC)    CAD (coronary artery disease)    Type 2 diabetes mellitus with hyperlipidemia (HCC)    COPD with acute exacerbation (HCC)    Tobacco abuse    PCP:  Place, [facility]:   Juliane Karenann [facility] [place], [place] - [address] [place] [address] [place] [address] [place] [address] Phone: [phone] Fax: [phone]     Social Drivers of Health (SDOH) Social History: SDOH Screenings   Food Insecurity: Patient Unable To Answer (01/08/2024)  Housing: Unknown (01/08/2024)  Transportation Needs: Patient Unable To Answer (01/08/2024)  Utilities: Patient Unable To Answer (01/08/2024)  Social Connections: Unknown (01/08/2024)  Tobacco Use: Medium Risk (01/07/2024)   SDOH Interventions:     Readmission Risk  Interventions    01/12/2024    2:21 PM 08/15/2023   11:13 AM  Readmission Risk Prevention Plan  Transportation Screening Complete Complete  PCP or Specialist Appt within 3-5 Days Complete   HRI or Home Care Consult Complete   Social Work Consult for Recovery Care Planning/Counseling Complete   Palliative Care Screening Complete   Medication Review Oceanographer) Complete   SW Recovery Care/Counseling Consult  Complete  Palliative Care Screening  Complete  Skilled Nursing Facility  Complete

## 2024-01-12 NOTE — Consult Note (Signed)
 "  NAME:  Anthony Mullaly., MRN:  [id number], DOB:  [birth year], LOS: 5 ADMISSION DATE:  01/07/2024, CONSULTATION DATE:  01/12/24 REFERRING MD:  Dr Jadine, CHIEF COMPLAINT:  Hypoxemic resp failure   History of Present Illness:  74 year old man seen before in the outpatient setting, last in 2023.  History of former tobacco (65 pack years), COPD (never had PFTs but emphysema on CT), nodular interstitial lung disease following COVID-19 in January 2023.  Also with peripheral vascular disease and left lower extremity acute arterial occlusion that required a left AKA, diabetes, CAD/PTCI, paroxysmal atrial fibrillation on Eliquis .  He has had chronic hypoxemic respiratory failure since that time, 3-4 L/min last time I saw him.  Apparently per reports at [facility] his oxygen compliance is poor.  He was admitted with hypoxemia, dyspnea and confusion on 01/07/2024.  His influenza, RSV and COVID testing was negative.  He has been treated for acute exacerbation of COPD and a left lower lobe HCAP, suspected acute on chronic diastolic CHF.  Remains on prednisone  60 mg daily, scheduled Lasix .  Despite treatment he has significant desaturations, has continued to require high flow oxygen, currently 15 L/min (75%).   Pertinent  Medical History   Past Medical History:  Diagnosis Date   Anxiety    Arthritis    Atrial fibrillation (HCC)    Atrial flutter (HCC)    Bipolar disorder (HCC)    CAD (coronary artery disease)    a. INF STEMI 07/04/10:  tx with thrombectomy + [facility] to [facility];  cath 07/04/10: dLM 10-20%, pLAD 40-50%, mLAD 20-30%, pRCA 30%, mRCA occluded and tx with PCI, EF 50% with inf HK. A Multilink   COPD (chronic obstructive pulmonary disease) (HCC)    Diabetes mellitus (HCC)    History of DVT (deep vein thrombosis)    HLD (hyperlipidemia)    Hypertension    Left above-knee amputee (HCC)    PAD (peripheral artery disease)    Polysubstance abuse (HCC)    Prior hx   S/P AKA (above knee  amputation), left (HCC) 01/09/2024   Tobacco abuse    Urinary frequency    Vitamin D  deficiency 09/05/2018    Significant Hospital Events: Including procedures, antibiotic start and stop dates in addition to other pertinent events   12/30 echocardiogram > LVEF 50-55% with mild concentric LVH, grade 1 diastolic dysfunction.  Normal RV size and systolic function 1/2 CT chest >> severe bilateral widespread paraseptal emphysema, bilateral dependent peripheral chronic interstitial disease not significantly changed for several scans including back to 2023.  Stable bilateral pulmonary nodules going back to 2020  Interim History / Subjective:  He denies any dyspnea, chest discomfort  Objective    Blood pressure 110/71, pulse (!) 58, temperature 98.1 F (36.7 C), temperature source Oral, resp. rate 18, height 5' 8 (1.727 m), weight 84.8 kg, SpO2 91%.    FiO2 (%):  [75 %] 75 %   Intake/Output Summary (Last 24 hours) at 01/12/2024 1513 Last data filed at 01/12/2024 1400 Gross per 24 hour  Intake 840 ml  Output 750 ml  Net 90 ml   Filed Weights   01/08/24 1700  Weight: 84.8 kg    Examination: General: Chronically ill gentleman laying in bed, sleeping comfortably with high flow oxygen in place HENT: Oropharynx clear, no secretions, no stridor Lungs: Good air movement, few scattered inspiratory crackles, no wheezes Cardiovascular: Distant, regular without a murmur Abdomen: Obese, nondistended with positive bowel sounds Extremities: Left AKA, no significant edema  Neuro: Awake and alert.  He does answer questions but very poor memory, poor insight into his disease GU: Deferred  Resolved problem list   Assessment and Plan   Acute on chronic hypoxemic respiratory failure, assuming that much of this is multifactorial and chronic.  He effectively diuresed, has no new findings on CT chest 1/2.  His oxygen compliance has been poor (supposed to be on 4 L/min going back as far as 2023).  Suspect  some degree of vascular remodeling due to chronic hypoxia although his echocardiogram does not show evidence for PAH or RV failure.  Suspect that this is end-stage respiratory failure.  Question whether he might have shunt physiology -I was able to turn his high flow oxygen down to 5 L/min.  He maintained saturations of 89-90% no matter the flow rate.  This was at rest.  He may not be able to maintain the same with any exertion.Will need to work to optimize treatment for his underlying contributors, but suspect that he will require high flow oxygen (or the highest flow that we can obtain for him in the outpatient setting) and that we may need to tolerate some degree of hypoxemia. -Could consider evaluation for shunt physiology but unclear that this will be high yield.  He had a bubble study 02/2021 that was nondiagnostic despite 3 attempts.  He would likely need TCD or TEE in order to workup. -Wean his FiO2 as low as possible but he may need [facility] situation, tolerating low saturations particularly with exertion.  Based on my evaluation today may be able to get him down to a reasonable flow rate that would allow him to be discharged from the hospital - I checked a D-dimer for completeness but suspicion for PE here is low especially since he has been on Eliquis .  Severe emphysematous COPD. - Continue LABA/LAMA - Will likely need to stay on some moderate dose of chronic prednisone , likely 20 mg daily long-term - Left lower lobe pneumonia has been effectively treated based on his CT chest from 1/2  Interstitial lung disease, likely related to his COVID-19 in 2023 - Significant but stable.  Supportive care - No role for antibiotics  Hypertension, atrial fibrillation, CAD with systolic and diastolic CHF - Regimen optimized as per cardiology   Labs   CBC: Recent Labs  Lab 01/07/24 0045 01/08/24 0500 01/09/24 0542  WBC 8.0 9.6 8.8  NEUTROABS 5.5  --   --    HGB 16.3 16.8 16.5  HCT 54.6* 57.3* 54.5*  MCV 90.2 91.7 89.3  PLT 234 231 223    Basic Metabolic Panel: Recent Labs  Lab 01/07/24 0045 01/08/24 0500 01/09/24 0542 01/10/24 0527  NA 140 142 140 136  K 4.4 4.1 3.9 4.0  CL 104 101 99 95*  CO2 26 32 30 30  GLUCOSE 236* 182* 161* 262*  BUN 17 17 20  28*  CREATININE 1.05 1.20 1.07 1.21  CALCIUM  9.3 9.6 9.6 9.2   GFR: Estimated Creatinine Clearance: 56 mL/min (by C-G formula based on SCr of 1.21 mg/dL). Recent Labs  Lab 01/07/24 0045 01/08/24 0500 01/09/24 0542  WBC 8.0 9.6 8.8    Liver Function Tests: Recent Labs  Lab 01/07/24 0045  AST 13*  ALT 10  ALKPHOS 62  BILITOT 0.3  PROT 7.0  ALBUMIN  4.0   Recent Labs  Lab 01/07/24 0045  LIPASE 31   No results for input(s): AMMONIA in the last 168 hours.  ABG    Component Value Date/Time   PHART 7.41 07/29/2023 0031   PCO2ART 41 07/29/2023 0031   PO2ART 64 (L) 07/29/2023 0031   HCO3 31.2 (H) 01/07/2024 0045   TCO2 24 07/26/2023 0201   ACIDBASEDEF 2.0 07/26/2023 0201   O2SAT 76.2 01/07/2024 0045     Coagulation Profile: No results for input(s): INR, PROTIME in the last 168 hours.  Cardiac Enzymes: No results for input(s): CKTOTAL, CKMB, CKMBINDEX, TROPONINI in the last 168 hours.  HbA1C: HB A1C (BAYER DCA - WAIVED)  Date/Time Value Ref Range Status  06/04/2017 12:34 PM 7.4 (H) <7.0 % Final    Comment:                                          Diabetic Adult            <7.0                                       Healthy Adult        4.3 - 5.7                                                           (DCCT/NGSP) American Diabetes Association's Summary of Glycemic Recommendations for Adults with Diabetes: Hemoglobin A1c <7.0%. More stringent glycemic goals (A1c <6.0%) may further reduce complications at the cost of increased risk of hypoglycemia.   02/15/2017 04:17 PM 7.6 (H) <7.0 % Final    Comment:                                           Diabetic Adult            <7.0                                       Healthy Adult        4.3 - 5.7                                                           (DCCT/NGSP) American Diabetes Association's Summary of Glycemic Recommendations for Adults with Diabetes: Hemoglobin A1c <7.0%. More stringent glycemic goals (A1c <6.0%) may further reduce complications at the cost of increased risk of hypoglycemia.    Hgb A1c MFr Bld  Date/Time Value Ref Range Status  01/07/2024 12:45 AM 7.8 (H) 4.8 - 5.6 % Final    Comment:    (NOTE) Diagnosis of Diabetes The following HbA1c ranges recommended by the American Diabetes Association (ADA) may be used as an aid in the diagnosis of diabetes mellitus.  Hemoglobin             Suggested A1C NGSP%  Diagnosis  <5.7                   Non Diabetic  5.7-6.4                Pre-Diabetic  >6.4                   Diabetic  <7.0                   Glycemic control for                       adults with diabetes.    07/27/2023 07:21 AM 6.5 (H) 4.8 - 5.6 % Final    Comment:    (NOTE) Diagnosis of Diabetes The following HbA1c ranges recommended by the American Diabetes Association (ADA) may be used as an aid in the diagnosis of diabetes mellitus.  Hemoglobin             Suggested A1C NGSP%              Diagnosis  <5.7                   Non Diabetic  5.7-6.4                Pre-Diabetic  >6.4                   Diabetic  <7.0                   Glycemic control for                       adults with diabetes.      CBG: Recent Labs  Lab 01/11/24 1129 01/11/24 1631 01/11/24 2106 01/12/24 0743 01/12/24 1138  GLUCAP 268* 254* 304* 149* 263*    Review of Systems:   Feels better.  Less dyspneic although memory of the events that led to his hospitalization is poor  Past Medical History:  He,  has a past medical history of Anxiety, Arthritis, Atrial fibrillation (HCC), Atrial flutter (HCC), Bipolar disorder (HCC), CAD (coronary  artery disease), COPD (chronic obstructive pulmonary disease) (HCC), Diabetes mellitus (HCC), History of DVT (deep vein thrombosis), HLD (hyperlipidemia), Hypertension, Left above-knee amputee (HCC), PAD (peripheral artery disease), Polysubstance abuse (HCC), S/P AKA (above knee amputation), left (HCC) (01/09/2024), Tobacco abuse, Urinary frequency, and Vitamin D  deficiency (09/05/2018).   Surgical History:   Past Surgical History:  Procedure Laterality Date   CARDIAC CATHETERIZATION     COLONOSCOPY N/A 12/29/2015   Procedure: COLONOSCOPY;  Surgeon: Claudis RAYMOND Rivet, MD;  Location: [facility];  Service: Endoscopy;  Laterality: N/A;  12:55   ELECTROCARDIOGRAM     Showed inferolateral ST-elevation and a code STEMI was activated. In the ER, he was treated with morphine , herarin, and 600 mg of Plavix. He was transferred emergently to [facility].    INGUINAL HERNIA REPAIR Left 07/26/2014   Procedure: OPEN REPAIR OF RECURENT LEFT INGUINAL HERNIA REPAIR WITH MESH;  Surgeon: Camellia Blush, MD;  Location: [facility] ORS;  Service: General;  Laterality: Left;   INSERTION OF MESH Bilateral 10/30/2013   Procedure: INSERTION OF MESH;  Surgeon: Camellia CHRISTELLA Blush, MD;  Location: [facility] OR[facility];  Service: General;  Laterality: Bilateral;   LAPAROSCOPIC INGUINAL HERNIA WITH UMBILICAL HERNIA Bilateral 10/30/2013   Procedure: laparoscopic repair left pantaloom hernia with mesh, laparoscopic right inguinal hernia with  mesh, OPEN REPAIR OF UMBILICAL HERNIA ;  Surgeon: Camellia CHRISTELLA Blush, MD;  Location: [facility];  Service: General;  Laterality: Bilateral;   LAPAROSCOPY N/A 07/26/2014   Procedure: LAPAROSCOPY DIAGNOSTIC;  Surgeon: Camellia Blush, MD;  Location: [facility];  Service: General;  Laterality: N/A;   POLYPECTOMY  12/29/2015   Procedure: POLYPECTOMY;  Surgeon: Claudis RAYMOND Rivet, MD;  Location: [facility];  Service: Endoscopy;;  ascending colon, descending colon   stent placement     TOTAL HIP ARTHROPLASTY Right 05/10/2015    Procedure: RIGHT TOTAL HIP ARTHROPLASTY ANTERIOR APPROACH;  Surgeon: Donnice Car, MD;  Location: [facility];  Service: Orthopedics;  Laterality: Right;   VASECTOMY       Social History:   reports that he has quit smoking. His smoking use included cigarettes. He has a 67.5 pack-year smoking history. He has never used smokeless tobacco. He reports that he does not drink alcohol  and does not use drugs.   Family History:  His family history includes Coronary artery disease in his father; Depression in his father and mother; Heart attack in his father.   Allergies Allergies[1]   Home Medications  Prior to Admission medications  Medication Sig Start Date End Date Taking? Authorizing Provider  acetaminophen  (TYLENOL ) 500 MG tablet Take 1,000 mg by mouth in the morning, at noon, and at bedtime.   Yes [provider]  albuterol  (VENTOLIN  HFA) 108 (90 Base) MCG/ACT inhaler Inhale 2 puffs into the lungs 3 (three) times daily as needed for wheezing.   Yes [provider]  apixaban  (ELIQUIS ) 5 MG TABS tablet Take 1 tablet (5 mg total) by mouth 2 (two) times daily. Patient taking differently: Take 5 mg by mouth in the morning and at bedtime. 03/03/21  Yes [name], MD  atorvastatin  (LIPITOR) 40 MG tablet Take 1 tablet (40 mg total) by mouth daily. Patient taking differently: Take 40 mg by mouth every evening. 12/22/18  Yes Alexander, Natalie, DO  Baclofen  5 MG TABS Take 5 mg by mouth in the morning and at bedtime.   Yes [provider]  BENADRYL  ALLERGY 25 MG tablet Take 25 mg by mouth 2 (two) times daily as needed for allergies.   Yes [provider]  cyanocobalamin  1000 MCG tablet Take 1 tablet (1,000 mcg total) by mouth daily. 08/16/23  Yes Arrien, Elidia Sieving, MD  escitalopram  (LEXAPRO ) 10 MG tablet Take 1 tablet (10 mg total) by mouth at bedtime. 03/03/21  Yes [name], MD  famotidine (PEPCID) 40 MG tablet Take 40 mg by mouth at bedtime.   Yes  [provider]  ferrous sulfate  325 (65 FE) MG tablet Take 1 tablet (325 mg total) by mouth daily with breakfast. 08/16/23  Yes Arrien, Elidia Sieving, MD  fexofenadine (ALLEGRA) 180 MG tablet Take 180 mg by mouth at bedtime.   Yes [provider]  Fluticasone -Umeclidin-Vilant (TRELEGY ELLIPTA ) 200-62.5-25 MCG/ACT AEPB Inhale 1 puff into the lungs daily. 08/15/23  Yes Arrien, Elidia Sieving, MD  furosemide  (LASIX ) 20 MG tablet Take 1 tablet (20 mg total) by mouth daily. 08/16/23  Yes Arrien, Elidia Sieving, MD  gabapentin  (NEURONTIN ) 100 MG capsule Take 100 mg by mouth in the morning and at bedtime.   Yes [provider]  glipiZIDE  (GLUCOTROL ) 10 MG tablet Take 1 tablet (10 mg total) by mouth daily before breakfast. 08/15/23  Yes Arrien, Elidia Sieving, MD  guaiFENesin  (MUCINEX ) 600 MG 12 hr tablet Take 600 mg by mouth 2 (two) times  daily.   Yes [provider]  ipratropium-albuterol  (DUONEB) 0.5-2.5 (3) MG/3ML SOLN Take 3 mLs by nebulization 3 (three) times daily after meals.   Yes [provider]  metFORMIN  (GLUCOPHAGE ) 1000 MG tablet Take 1,000 mg by mouth daily with breakfast.   Yes [provider]  nystatin ointment (MYCOSTATIN) Apply 1 Application topically 2 (two) times daily as needed (for irritation- a thin layer to the groin area).   Yes [provider]  Olopatadine  HCl (PATADAY ) 0.2 % SOLN Place 1 drop into both eyes in the morning.   Yes [provider]  Polyethyl Glycol-Propyl Glycol (SYSTANE) 0.4-0.3 % SOLN Place 1 drop into both eyes as needed (for dryness and irritation).   Yes [provider]  polyethylene glycol (MIRALAX  / GLYCOLAX ) 17 g packet Take 17 g by mouth See admin instructions. Mix 17 grams into 4-6 ounces of a beverage and drink by mouth every other day   Yes [provider]  potassium chloride  (KLOR-CON  M) 10 MEQ tablet Take 1 tablet (10 mEq total) by mouth daily. 08/15/23  Yes Arrien,  Elidia Sieving, MD  ramelteon  (ROZEREM ) 8 MG tablet Take 8 mg by mouth at bedtime.   Yes [provider]  Semaglutide  7 MG TABS Take 7 mg by mouth daily.   Yes [provider]  sodium chloride  (OCEAN) 0.65 % nasal spray Place 1 spray into the nose 3 (three) times daily.   Yes [provider]  STIOLTO RESPIMAT  2.5-2.5 MCG/ACT AERS Inhale 2 puffs into the lungs in the morning.   Yes [provider]  traZODone  (DESYREL ) 100 MG tablet Take 200 mg by mouth at bedtime. 12/11/21  Yes [provider]  traZODone  (DESYREL ) 50 MG tablet Take 25 mg by mouth at bedtime. 07/08/23  Yes [provider]  blood glucose meter kit and supplies KIT Dispense based on patient and insurance preference. Use to check BG once daily or every other other.  Dx: E11.9 07/17/16   Lavell Bari LABOR, FNP  feeding supplement, GLUCERNA SHAKE, (GLUCERNA SHAKE) LIQD Take 237 mLs by mouth 3 (three) times daily between meals. Patient not taking: Reported on 01/07/2024 08/15/23   Arrien, Elidia Sieving, MD  metFORMIN  (GLUCOPHAGE -XR) 500 MG 24 hr tablet TAKE 2 TABLETS BY MOUTH  DAILY WITH BREAKFAST Patient not taking: Reported on 01/07/2024 01/18/20   [name], DO     Critical care time: NA     Lamar Chris, MD, PhD 01/12/2024, 3:43 PM [facility] [phone] or if no answer before 7:00PM call [phone] For any issues after 7:00PM please call eLink [phone]      [1]  Allergies Allergen Reactions   Flomax [Tamsulosin Hcl] Nausea Only and Other (See Comments)    This medication makes patient feel sick and Allergic, per facility   Penicillins Hives and Other (See Comments)    Allergic, per facility   "

## 2024-01-13 ENCOUNTER — Inpatient Hospital Stay (HOSPITAL_COMMUNITY)

## 2024-01-13 ENCOUNTER — Encounter (HOSPITAL_COMMUNITY): Payer: Self-pay | Admitting: Internal Medicine

## 2024-01-13 DIAGNOSIS — E1169 Type 2 diabetes mellitus with other specified complication: Secondary | ICD-10-CM | POA: Diagnosis not present

## 2024-01-13 DIAGNOSIS — E785 Hyperlipidemia, unspecified: Secondary | ICD-10-CM | POA: Diagnosis not present

## 2024-01-13 DIAGNOSIS — J441 Chronic obstructive pulmonary disease with (acute) exacerbation: Secondary | ICD-10-CM | POA: Diagnosis not present

## 2024-01-13 DIAGNOSIS — J9601 Acute respiratory failure with hypoxia: Secondary | ICD-10-CM | POA: Diagnosis not present

## 2024-01-13 DIAGNOSIS — I10 Essential (primary) hypertension: Secondary | ICD-10-CM

## 2024-01-13 DIAGNOSIS — I5033 Acute on chronic diastolic (congestive) heart failure: Secondary | ICD-10-CM | POA: Diagnosis not present

## 2024-01-13 DIAGNOSIS — J439 Emphysema, unspecified: Secondary | ICD-10-CM

## 2024-01-13 LAB — GLUCOSE, CAPILLARY
Glucose-Capillary: 245 mg/dL — ABNORMAL HIGH (ref 70–99)
Glucose-Capillary: 220 mg/dL — ABNORMAL HIGH (ref 70–99)
Glucose-Capillary: 510 mg/dL (ref 70–99)
Glucose-Capillary: 202 mg/dL — ABNORMAL HIGH (ref 70–99)

## 2024-01-13 MED ORDER — IOHEXOL 350 MG/ML SOLN
75.0000 mL | Freq: Once | INTRAVENOUS | Status: AC | PRN
  Administered 2024-01-13: 75 mL via INTRAVENOUS

## 2024-01-13 MED ORDER — INSULIN ASPART 100 UNIT/ML IJ SOLN
0.0000 [IU] | Freq: Three times a day (TID) | INTRAMUSCULAR | Status: DC
  Administered 2024-01-13: 20 [IU] via SUBCUTANEOUS
  Administered 2024-01-14: 4 [IU] via SUBCUTANEOUS
  Administered 2024-01-14: 11 [IU] via SUBCUTANEOUS
  Administered 2024-01-14: 15 [IU] via SUBCUTANEOUS
  Administered 2024-01-15: 3 [IU] via SUBCUTANEOUS
  Administered 2024-01-15: 7 [IU] via SUBCUTANEOUS
  Administered 2024-01-15 – 2024-01-16 (×2): 4 [IU] via SUBCUTANEOUS
  Administered 2024-01-16: 15 [IU] via SUBCUTANEOUS
  Administered 2024-01-16 – 2024-01-17 (×2): 4 [IU] via SUBCUTANEOUS
  Administered 2024-01-17: 7 [IU] via SUBCUTANEOUS
  Administered 2024-01-17: 11 [IU] via SUBCUTANEOUS
  Administered 2024-01-18: 4 [IU] via SUBCUTANEOUS
  Administered 2024-01-18: 15 [IU] via SUBCUTANEOUS
  Filled 2024-01-13 (×2): qty 4
  Filled 2024-01-13: qty 7
  Filled 2024-01-13: qty 15
  Filled 2024-01-13: qty 11
  Filled 2024-01-13 (×2): qty 15
  Filled 2024-01-13: qty 4
  Filled 2024-01-13: qty 7
  Filled 2024-01-13: qty 20
  Filled 2024-01-13: qty 11
  Filled 2024-01-13: qty 4
  Filled 2024-01-13: qty 8
  Filled 2024-01-13: qty 4

## 2024-01-13 MED ORDER — INSULIN ASPART 100 UNIT/ML IJ SOLN
0.0000 [IU] | Freq: Every day | INTRAMUSCULAR | Status: DC
  Administered 2024-01-13: 2 [IU] via SUBCUTANEOUS
  Administered 2024-01-14: 3 [IU] via SUBCUTANEOUS
  Administered 2024-01-15: 4 [IU] via SUBCUTANEOUS
  Administered 2024-01-16: 3 [IU] via SUBCUTANEOUS
  Filled 2024-01-13 (×2): qty 3
  Filled 2024-01-13: qty 4
  Filled 2024-01-13: qty 5
  Filled 2024-01-13: qty 2

## 2024-01-13 MED ORDER — INSULIN GLARGINE 100 UNIT/ML ~~LOC~~ SOLN
15.0000 [IU] | Freq: Every day | SUBCUTANEOUS | Status: DC
  Administered 2024-01-14 – 2024-01-18 (×5): 15 [IU] via SUBCUTANEOUS
  Filled 2024-01-13 (×5): qty 0.15

## 2024-01-13 NOTE — Progress Notes (Signed)
 [facility] Liaison Note  Received previous request from [facility] for hospice services at LTC after discharge. Spoke with patient's daughter Anthony [name] to initiate education related to hospice philosophy, services and team approach to care. Anthony [name] verbalized understanding of information given. Per discussion, the plan is discuss discharge date tomorrow after O2 weaning.   DME needs discussed. Patient has the following equipment in the home: faciltiy equipment Family requests the following equipment for delivery: will need oxygen delivered, liter flow TBD.  Please send signed and completed DNR home with patient/family. Please provide prescriptions at discharge as needed to ensure ongoing symptom management.  [facility] information and contact numbers given to [place]. Please call with any concerns.  Thank you for the opportunity to participate in this patient's care.   Eleanor Nail, LPN [facility] Liaison [phone]

## 2024-01-13 NOTE — Inpatient Diabetes Management (Signed)
 Inpatient Diabetes Program Recommendations  AACE/ADA: New Consensus Statement on Inpatient Glycemic Control (2015)  Target Ranges:  Prepandial:   less than 140 mg/dL      Peak postprandial:   less than 180 mg/dL (1-2 hours)      Critically ill patients:  140 - 180 mg/dL   Lab Results  Component Value Date   GLUCAP 245 (H) 01/13/2024   HGBA1C 7.8 (H) 01/07/2024    Review of Glycemic Control  Latest Reference Range & Units 01/12/24 07:43 01/12/24 11:38 01/12/24 16:42 01/12/24 20:45 01/13/24 07:47  Glucose-Capillary 70 - 99 mg/dL 850 (H) 736 (H) 617 (H) 320 (H) 245 (H)  (H): Data is abnormally high  Diabetes history: DM2 Outpatient Diabetes medications:  Glipizide  10 mg Daily, metformin  1000 mg Daily, Rybelsus  7 mg Daily  Current orders for Inpatient glycemic control:  Lantus  15 units  Novolog  0-9 units TID Prednisone  60 mg QAM  Inpatient Diabetes Program Recommendations:    Postprandials are elevated.  Please consider:  Novolog  3 units TID  Novolog  0-5 units at bedtime  Noted Lantus  to increase tomorrow to 15 units every day.   Thank you, Wyvonna Pinal, MSN, CDCES Diabetes Coordinator Inpatient Diabetes Program [phone] (team pager from 8a-5p)

## 2024-01-13 NOTE — Progress Notes (Signed)
 "  NAME:  Anthony Laraia., MRN:  [id number], DOB:  March 27, 1950, LOS: 6 ADMISSION DATE:  01/07/2024, CONSULTATION DATE:  01/12/24 REFERRING MD:  Dr Jadine, CHIEF COMPLAINT:  Hypoxemic resp failure   History of Present Illness:  74 year old man seen before in the outpatient setting, last in 2023.  History of former tobacco (65 pack years), COPD (never had PFTs but emphysema on CT), nodular interstitial lung disease following COVID-19 in January 2023.  Also with peripheral vascular disease and left lower extremity acute arterial occlusion that required a left AKA, diabetes, CAD/PTCI, paroxysmal atrial fibrillation on Eliquis .  He has had chronic hypoxemic respiratory failure since that time, 3-4 L/min last time I saw him.  Apparently per reports at [facility] his oxygen compliance is poor.  He was admitted with hypoxemia, dyspnea and confusion on 01/07/2024.  His influenza, RSV and COVID testing was negative.  He has been treated for acute exacerbation of COPD and a left lower lobe HCAP, suspected acute on chronic diastolic CHF.  Remains on prednisone  60 mg daily, scheduled Lasix .  Despite treatment he has significant desaturations, has continued to require high flow oxygen, currently 15 L/min (75%).   Pertinent  Medical History   Past Medical History:  Diagnosis Date   Anxiety    Arthritis    Atrial fibrillation (HCC)    Atrial flutter (HCC)    Bipolar disorder (HCC)    CAD (coronary artery disease)    a. INF STEMI 07/04/10:  tx with thrombectomy + [facility] to [facility];  cath 07/04/10: dLM 10-20%, pLAD 40-50%, mLAD 20-30%, pRCA 30%, mRCA occluded and tx with PCI, EF 50% with inf HK. A Multilink   COPD (chronic obstructive pulmonary disease) (HCC)    Diabetes mellitus (HCC)    History of DVT (deep vein thrombosis)    HLD (hyperlipidemia)    Hypertension    Left above-knee amputee (HCC)    PAD (peripheral artery disease)    Polysubstance abuse (HCC)    Prior hx   S/P AKA (above knee  amputation), left (HCC) 01/09/2024   Tobacco abuse    Urinary frequency    Vitamin D  deficiency 09/05/2018    Significant Hospital Events: Including procedures, antibiotic start and stop dates in addition to other pertinent events   12/30 echocardiogram > LVEF 50-55% with mild concentric LVH, grade 1 diastolic dysfunction.  Normal RV size and systolic function 1/2 CT chest >> severe bilateral widespread paraseptal emphysema, bilateral dependent peripheral chronic interstitial disease not significantly changed for several scans including back to 2023.  Stable bilateral pulmonary nodules going back to 2020  Interim History / Subjective:   Denies chest pain or dyspnea On HFNC 15 L  Objective    Blood pressure 114/69, pulse 64, temperature 97.6 F (36.4 C), temperature source Oral, resp. rate 16, height 5' 8 (1.727 m), weight 84.8 kg, SpO2 90%.        Intake/Output Summary (Last 24 hours) at 01/13/2024 1523 Last data filed at 01/13/2024 1520 Gross per 24 hour  Intake 240 ml  Output 1051 ml  Net -811 ml   Filed Weights   01/08/24 1700  Weight: 84.8 kg    Examination: General: Chronically ill gentleman sitting up in bed, no distress HENT: Oropharynx clear, no secretions, no stridor Lungs: No accessory muscle use, able to speak in full sentences, crackles right base Cardiovascular: Distant, regular without a murmur Abdomen: Obese, soft, non tender Extremities: Left AKA, no edema Neuro: Awake and alert.  Nonfocal,  poor memory  Resolved problem list   Assessment and Plan   Acute on chronic hypoxemic respiratory failure -  His oxygen compliance has been poor (supposed to be on 4 L/min going back as far as 2023).  Review of his CT does show some peripheral ILD but appears to be stable from 2023. Echo does not show any evidence of elevated RVSP or cor pulmonale surprisingly, appears negative for interatrial shunt although this was not a bubble study  - He had a bubble study  02/2021 that was nondiagnostic despite 3 attempts.  I doubt further evaluation is necessary -D-dimer is slight high but unable to interpret in the context of Eliquis . -I would attempt to wean down oxygen keeping saturation 88% , if he truly has a shunt he may tolerate lower levels of FiO2  Severe emphysematous COPD. - Continue LABA/LAMA - Will likely need to stay on some moderate dose of chronic prednisone , likely 20 mg daily long-term   Interstitial lung disease, likely related to his COVID-19 in 2023 - Significant but stable.  - No role for antibiotics  Hypertension, atrial fibrillation, CAD with systolic and diastolic CHF - Regimen optimized as per cardiology   Discussed with hospitalist, can give him some more time to attempt to lower FiO2 to see if we can get him to less than 10 L in which case we can consider home discharge with 2 oxygen concentrators.  Otherwise he will need facility discharge.  Seems like hospice is under consideration.  PCCM will be available as needed  Labs   CBC: Recent Labs  Lab 01/07/24 0045 01/08/24 0500 01/09/24 0542  WBC 8.0 9.6 8.8  NEUTROABS 5.5  --   --   HGB 16.3 16.8 16.5  HCT 54.6* 57.3* 54.5*  MCV 90.2 91.7 89.3  PLT 234 231 223    Basic Metabolic Panel: Recent Labs  Lab 01/07/24 0045 01/08/24 0500 01/09/24 0542 01/10/24 0527  NA 140 142 140 136  K 4.4 4.1 3.9 4.0  CL 104 101 99 95*  CO2 26 32 30 30  GLUCOSE 236* 182* 161* 262*  BUN 17 17 20  28*  CREATININE 1.05 1.20 1.07 1.21  CALCIUM  9.3 9.6 9.6 9.2   GFR: Estimated Creatinine Clearance: 56 mL/min (by C-G formula based on SCr of 1.21 mg/dL). Recent Labs  Lab 01/07/24 0045 01/08/24 0500 01/09/24 0542  WBC 8.0 9.6 8.8    Liver Function Tests: Recent Labs  Lab 01/07/24 0045  AST 13*  ALT 10  ALKPHOS 62  BILITOT 0.3  PROT 7.0  ALBUMIN  4.0   Recent Labs  Lab 01/07/24 0045  LIPASE 31   No results for input(s): AMMONIA in the last 168 hours.  ABG     Component Value Date/Time   PHART 7.41 07/29/2023 0031   PCO2ART 41 07/29/2023 0031   PO2ART 64 (L) 07/29/2023 0031   HCO3 31.2 (H) 01/07/2024 0045   TCO2 24 07/26/2023 0201   ACIDBASEDEF 2.0 07/26/2023 0201   O2SAT 76.2 01/07/2024 0045     Coagulation Profile: No results for input(s): INR, PROTIME in the last 168 hours.  Cardiac Enzymes: No results for input(s): CKTOTAL, CKMB, CKMBINDEX, TROPONINI in the last 168 hours.  HbA1C: HB A1C (BAYER DCA - WAIVED)  Date/Time Value Ref Range Status  06/04/2017 12:34 PM 7.4 (H) <7.0 % Final    Comment:  Diabetic Adult            <7.0                                       Healthy Adult        4.3 - 5.7                                                           (DCCT/NGSP) American Diabetes Association's Summary of Glycemic Recommendations for Adults with Diabetes: Hemoglobin A1c <7.0%. More stringent glycemic goals (A1c <6.0%) may further reduce complications at the cost of increased risk of hypoglycemia.   02/15/2017 04:17 PM 7.6 (H) <7.0 % Final    Comment:                                          Diabetic Adult            <7.0                                       Healthy Adult        4.3 - 5.7                                                           (DCCT/NGSP) American Diabetes Association's Summary of Glycemic Recommendations for Adults with Diabetes: Hemoglobin A1c <7.0%. More stringent glycemic goals (A1c <6.0%) may further reduce complications at the cost of increased risk of hypoglycemia.    Hgb A1c MFr Bld  Date/Time Value Ref Range Status  01/07/2024 12:45 AM 7.8 (H) 4.8 - 5.6 % Final    Comment:    (NOTE) Diagnosis of Diabetes The following HbA1c ranges recommended by the American Diabetes Association (ADA) may be used as an aid in the diagnosis of diabetes mellitus.  Hemoglobin             Suggested A1C NGSP%              Diagnosis  <5.7                    Non Diabetic  5.7-6.4                Pre-Diabetic  >6.4                   Diabetic  <7.0                   Glycemic control for                       adults with diabetes.    07/27/2023 07:21 AM 6.5 (H) 4.8 - 5.6 % Final    Comment:    (NOTE) Diagnosis of Diabetes The following HbA1c ranges recommended by the American Diabetes Association (ADA) may  be used as an aid in the diagnosis of diabetes mellitus.  Hemoglobin             Suggested A1C NGSP%              Diagnosis  <5.7                   Non Diabetic  5.7-6.4                Pre-Diabetic  >6.4                   Diabetic  <7.0                   Glycemic control for                       adults with diabetes.      CBG: Recent Labs  Lab 01/12/24 1138 01/12/24 1642 01/12/24 2045 01/13/24 0747 01/13/24 1132  GLUCAP 263* 382* 320* 245* 220*     Harden Staff MD. FCCP. [place] [facility] Pager : [phone]  If no response to pager , please call [phone] until 7 pm After 7:00 pm call Elink  [phone]     01/13/2024, 3:23 PM     "

## 2024-01-13 NOTE — TOC Progression Note (Signed)
 Transition of Care (TOC) - Progression Note    Patient Details  Name: Anthony [name]. MRN: [id number] Date of Birth: 10-Apr-1950  Transition of Care [facility]) CM/SW Contact  Leamon Palau, Nathanel, RN Phone Number: 01/13/2024, 12:25 PM  Clinical Narrative: Per [facility] rep starr-they cannot manage HFNC, & 02 max 5l-will await MD decision since dtr in agreement to home w/hospice [facility] already following or return [facility] w/hospice once 02 weaned.      Expected Discharge Plan: (P) Long Term Nursing Home Barriers to Discharge: Continued Medical Work up               Expected Discharge Plan and Services In-house Referral: Clinical Social Work Discharge Planning Services: CM Consult Post Acute Care Choice: Nursing Home Living arrangements for the past 2 months: Skilled Nursing Facility                                       Social Drivers of Health (SDOH) Interventions SDOH Screenings   Food Insecurity: Patient Unable To Answer (01/08/2024)  Housing: Unknown (01/08/2024)  Transportation Needs: Patient Unable To Answer (01/08/2024)  Utilities: Patient Unable To Answer (01/08/2024)  Social Connections: Unknown (01/08/2024)  Tobacco Use: Medium Risk (01/07/2024)    Readmission Risk Interventions    01/12/2024    2:21 PM 08/15/2023   11:13 AM  Readmission Risk Prevention Plan  Transportation Screening Complete Complete  PCP or Specialist Appt within 3-5 Days Complete   HRI or Home Care Consult Complete   Social Work Consult for Recovery Care Planning/Counseling Complete   Palliative Care Screening Complete   Medication Review Oceanographer) Complete   SW Recovery Care/Counseling Consult  Complete  Palliative Care Screening  Complete  Skilled Nursing Facility  Complete

## 2024-01-13 NOTE — Progress Notes (Signed)
 Heart Failure Navigator Progress Note  Assessed for Heart & Vascular TOC clinic readiness.  Patient does not meet criteria due to plan for discharge tomorrow to [facility] with Hospice services. No HF TOC. .   Navigator will sign off at this time.   Stephane Haddock, BSN, Scientist, Clinical (histocompatibility And Immunogenetics) Only

## 2024-01-13 NOTE — Progress Notes (Signed)
 " Progress Note   Patient: Anthony [name]. FMW:[id number] DOB: October 08, 1950 DOA: 01/07/2024     6 DOS: the patient was seen and examined on 01/13/2024   Brief hospital course: 74 year old man PMH including COPD, CAD, PAF, presented with shortness of breath and confusion.  Found to be hypoxic in the low 80s.  Placed on BiPAP.  Admitted for acute hypoxic respiratory failure, CHF exacerbation.  Consultants Cardiology   Procedures/Events 12/30  Assessment and Plan: Acute on chronic hypoxic respiratory failure Acute on chronic diastolic CHF COPD exacerbation Left-sided pneumonia Presented hypoxic and tachypneic requiring BiPAP. Was on nonrebreather, weaned to high flow nasal cannula. Influenza, RSV, COVID-negative.  Chest x-ray showed left basilar infiltrate, pulmonary edema. LVEF 50-55% Follow-up CT chest 1/2 showed no acute abnormality, but did show severe emphysema Continues to have high oxygen requirement, unable to wean but no distress and appears, comfortable. Continue oral diuretic, bronchodilators, antibiotics Appreciate pulmonology.  D-dimer was positive so we will check CTA chest.  However he has been on apixaban , PE doubted. Currently appears stable and similar to previous days on 15 L nasal cannula.  Sats are high 80s to 90.   Bilateral emphysema ILD Chronic hypoxic respiratory failure Multiple lung nodules PMH 65-pack-year Followed by Dr. Shelah, last seen 2023, thought to be poor candidate for antifibrotics or biopsy. Pulmonary medicine consultation for further recommendations   Paroxysmal atrial fibrillation Has been in sinus rhythm.  Continue apixaban .  Could not tolerate beta-blocker because of hypotension in past.  Not a good candidate for alternative antiarrhythmics given chronic lung disease. Stable   CAD status post RCA stent Hyperlipidemia Troponin flat, myocardial injury in the setting of heart failure  Continue statin.  No aspirin  with Eliquis .   Diabetes  mellitus type 2 Hemoglobin A1c 7.8 CBG remains high.  Holding metformin  and glipizide .  Continue sliding scale insulin .  Increase long-acting insulin     Chronic pain Continue gabapentin    Mild cognitive impairment Stable.  Continue delirium precautions   PAD  Status post left AKA   Prolonged QTc, resolved Repeat EKG 1/1 independently reviewed, QTc within normal limits   Disposition From: [facility] To:   Goals of care: Updated daughter [name]/power of attorney by telephone she understands his clinical condition and that recovery is in doubt.  She would like to consider hospice.     Subjective:  Asked to see this patient personally in the morning for hypoxia overnight requiring 15 L plus nonrebreather.  Patient denies complaints, breathing fine, eye feels better.  Physical Exam: Vitals:   01/13/24 0533 01/13/24 0829 01/13/24 0830 01/13/24 0831  BP: 116/72     Pulse: (!) 55     Resp: 20     Temp: 97.7 F (36.5 C)     TempSrc: Oral     SpO2: 90% 92% 92% 92%  Weight:      Height:       Physical Exam Vitals reviewed.  Constitutional:      General: He is not in acute distress.    Appearance: He is not ill-appearing or toxic-appearing.  Cardiovascular:     Rate and Rhythm: Normal rate and regular rhythm.     Heart sounds: No murmur heard. Pulmonary:     Effort: Pulmonary effort is normal. No respiratory distress.     Breath sounds: No wheezing, rhonchi or rales.  Musculoskeletal:     Right lower leg: No edema.  Neurological:     Mental Status: He is alert.  Psychiatric:        Mood and Affect: Mood normal.        Behavior: Behavior normal.     Data Reviewed: Fasting CBG 245 D-dimer was elevated 1.34  Family Communication: daughter/HCPOA [name] by telephone  Disposition: Status is: Inpatient Remains inpatient appropriate because: hypoxia     Time spent: 35 minutes  Author: Toribio Door, MD 01/13/2024 9:15 AM  For on call  review [email or web address].    "

## 2024-01-14 LAB — GLUCOSE, CAPILLARY
Glucose-Capillary: 197 mg/dL — ABNORMAL HIGH (ref 70–99)
Glucose-Capillary: 263 mg/dL — ABNORMAL HIGH (ref 70–99)
Glucose-Capillary: 323 mg/dL — ABNORMAL HIGH (ref 70–99)
Glucose-Capillary: 257 mg/dL — ABNORMAL HIGH (ref 70–99)

## 2024-01-14 LAB — COMPREHENSIVE METABOLIC PANEL WITH GFR
ALT: 20 U/L (ref 0–44)
AST: 21 U/L (ref 15–41)
Albumin: 3.8 g/dL (ref 3.5–5.0)
Alkaline Phosphatase: 49 U/L (ref 38–126)
Anion gap: 9 (ref 5–15)
BUN: 35 mg/dL — ABNORMAL HIGH (ref 8–23)
CO2: 30 mmol/L (ref 22–32)
Calcium: 9.4 mg/dL (ref 8.9–10.3)
Chloride: 99 mmol/L (ref 98–111)
Creatinine, Ser: 1.14 mg/dL (ref 0.61–1.24)
GFR, Estimated: >60 mL/min
Glucose, Bld: 180 mg/dL — ABNORMAL HIGH (ref 70–99)
Potassium: 4.2 mmol/L (ref 3.5–5.1)
Sodium: 138 mmol/L (ref 135–145)
Total Bilirubin: 0.4 mg/dL (ref 0.0–1.2)
Total Protein: 6.6 g/dL (ref 6.5–8.1)

## 2024-01-14 MED ORDER — INSULIN ASPART 100 UNIT/ML IJ SOLN
4.0000 [IU] | Freq: Three times a day (TID) | INTRAMUSCULAR | Status: DC
  Administered 2024-01-15 – 2024-01-18 (×11): 4 [IU] via SUBCUTANEOUS
  Filled 2024-01-14 (×9): qty 4

## 2024-01-14 NOTE — Progress Notes (Addendum)
 " Progress Note   Patient: Anthony [name]. FMW:[id number] DOB: 1950/11/16 DOA: 01/07/2024     7 DOS: the patient was seen and examined on 01/14/2024   Brief hospital course: 74 year old man PMH including COPD, CAD, PAF, presented with shortness of breath and confusion.  Found to be hypoxic in the low 80s.  Placed on BiPAP.  Admitted for acute hypoxic respiratory failure, CHF exacerbation, pneumonia, COPD.  Seen by pulmonology and cardiology.  Diuresed.  Treated for pneumonia.  Despite treatment continues to have high oxygen requirement although asymptomatic.  Consultants Cardiology   Procedures/Events 12/30  Assessment and Plan: Acute on chronic hypoxic respiratory failure Acute on chronic diastolic CHF COPD exacerbation Left-sided pneumonia Presented hypoxic and tachypneic requiring BiPAP. Was on nonrebreather, weaned to high flow nasal cannula. Influenza, RSV, COVID-negative.  Chest x-ray showed left basilar infiltrate, pulmonary edema. LVEF 50-55% Follow-up CT chest 1/2 showed no acute abnormality, but did show severe emphysema CTA study 1/5 no PE, increased ground glass opacities and consolidation bilateral lower lobes Continues to require oxygen at 15 L although completely asymptomatic.  Sats borderline. Per pulmonology, if able to wean to 10 L, could consider discharge with 2 oxygen concentrator's, otherwise he may need a facility discharge. Continue to explore hospice if fails to improve   Bilateral emphysema ILD Chronic hypoxic respiratory failure Multiple lung nodules PMH 65-pack-year Followed by Dr. Shelah, last seen 2023, thought to be poor candidate for antifibrotics or biopsy. Pulmonary medicine consultation for further recommendations  Severe intrahepatic bile duct dilatation Dilated gallbladder with cholelithiasis Concern for distal biliary obstruction CTA chest was negative for PE but concerning for bile duct dilatation.  Dedicated ultrasound showed cholelithiasis  and dilated gallbladder, intrahepatic biliary duct dilatation concerning for distal biliary obstruction LFTs 1/6 within normal limits, exam benign and patient asymptomatic. Clinical significance of above findings unclear   Paroxysmal atrial fibrillation Has been in sinus rhythm.  Continue apixaban .  Could not tolerate beta-blocker because of hypotension in past.  Not a good candidate for alternative antiarrhythmics given chronic lung disease. Stable   CAD status post RCA stent Hyperlipidemia Troponin flat, myocardial injury in the setting of heart failure  Continue statin.  No aspirin  with Eliquis .   Diabetes mellitus type 2 Hemoglobin A1c 7.8 CBG high holding metformin  and glipizide .  Continue sliding scale insulin .  Increase long-acting insulin .  Add meal coverage   Chronic pain Continue gabapentin    Mild cognitive impairment Stable.  Continue delirium precautions   PAD  Status post left AKA   Prolonged QTc, resolved Repeat EKG 1/1 independently reviewed, QTc within normal limits   Disposition From: [facility] To:    Goals of care:  Discussed with daughter/POA 1/5, called again 1/6 no answer. Continue to discuss borderline respiratory status Review new findings in regard to ultrasound and CT with normal laboratory findings.  Could consider GI consultation tomorrow, however the patient is currently asymptomatic, afebrile with normal vitals.  No signs or symptoms of obstruction.  Revisit tomorrow.      Subjective:  Feels fine today, no complaint no abdominal pain  Physical Exam: Vitals:   01/14/24 0907 01/14/24 0908 01/14/24 1335 01/14/24 1509  BP:   127/72   Pulse:   62   Resp:   16   Temp:   98.1 F (36.7 C)   TempSrc:   Oral   SpO2: 93% 93% 92% 93%  Weight:      Height:       Physical Exam  Vitals reviewed.  Constitutional:      General: He is not in acute distress.    Appearance: He is not ill-appearing or toxic-appearing.   Cardiovascular:     Rate and Rhythm: Normal rate and regular rhythm.     Heart sounds: No murmur heard. Pulmonary:     Effort: Pulmonary effort is normal. No respiratory distress.     Breath sounds: No wheezing, rhonchi or rales.  Abdominal:     General: There is no distension.     Palpations: Abdomen is soft.     Tenderness: There is no abdominal tenderness.     Comments: No right upper quadrant pain  Musculoskeletal:     Right lower leg: No edema.  Neurological:     Mental Status: He is alert.  Psychiatric:        Mood and Affect: Mood normal.        Behavior: Behavior normal.     Data Reviewed: CBG elevated Complete metabolic panel unremarkable CT chest noted Right upper quadrant ultrasound noted   Family Communication: Called daughter, no answer  Disposition: Status is: Inpatient Remains inpatient appropriate because: Hypoxia     Time spent: 35 minutes  Author: Toribio Door, MD 01/14/2024 7:14 PM  For on call review [email or web address].    "

## 2024-01-14 NOTE — Inpatient Diabetes Management (Signed)
 Inpatient Diabetes Program Recommendations  AACE/ADA: New Consensus Statement on Inpatient Glycemic Control (2015)  Target Ranges:  Prepandial:   less than 140 mg/dL      Peak postprandial:   less than 180 mg/dL (1-2 hours)      Critically ill patients:  140 - 180 mg/dL    Latest Reference Range & Units 01/13/24 07:47 01/13/24 11:32 01/13/24 16:47 01/13/24 20:53  Glucose-Capillary 70 - 99 mg/dL 754 (H)  3 units Novolog   10 units Lantus   220 (H)  3 units Novolog   510 (HH)  20 units Novolog   202 (H)  2 units Novolog      Latest Reference Range & Units 01/14/24 07:29 01/14/24 11:10  Glucose-Capillary 70 - 99 mg/dL 802 (H)  4 units Novolog   263 (H)  11 units Novolog   15 units Lantus   (H): Data is abnormally high     History: DM2  Home DM Meds:  Glipizide  10 mg Daily Metformin  1000 mg Daily Rybelsus  7 mg Daily   Current Orders: Lantus  15 units daily Novolog  Resistant Correction Scale/ SSI (0-20 units) TID AC + HS     MD- Note pt getting Prednisone  60 mg daily.  Afternoon CBGs remain elevated.  Please consider adding Novolog  Meal Coverage: Novolog  4 units TID with meals HOLD if pt NPO HOLD if pt eats <50% meals    --Will follow patient during hospitalization--  Adina Rudolpho Arrow RN, MSN, CDCES Diabetes Coordinator Inpatient Glycemic Control Team Team Pager: [phone] (8a-5p)

## 2024-01-15 DIAGNOSIS — I5033 Acute on chronic diastolic (congestive) heart failure: Secondary | ICD-10-CM | POA: Diagnosis not present

## 2024-01-15 DIAGNOSIS — J441 Chronic obstructive pulmonary disease with (acute) exacerbation: Secondary | ICD-10-CM | POA: Diagnosis not present

## 2024-01-15 LAB — COMPREHENSIVE METABOLIC PANEL WITH GFR
ALT: 20 U/L (ref 0–44)
AST: 16 U/L (ref 15–41)
Albumin: 4 g/dL (ref 3.5–5.0)
Alkaline Phosphatase: 52 U/L (ref 38–126)
Anion gap: 11 (ref 5–15)
BUN: 29 mg/dL — ABNORMAL HIGH (ref 8–23)
CO2: 31 mmol/L (ref 22–32)
Calcium: 9.6 mg/dL (ref 8.9–10.3)
Chloride: 99 mmol/L (ref 98–111)
Creatinine, Ser: 1.04 mg/dL (ref 0.61–1.24)
GFR, Estimated: >60 mL/min
Glucose, Bld: 180 mg/dL — ABNORMAL HIGH (ref 70–99)
Potassium: 4.1 mmol/L (ref 3.5–5.1)
Sodium: 140 mmol/L (ref 135–145)
Total Bilirubin: 0.6 mg/dL (ref 0.0–1.2)
Total Protein: 7.1 g/dL (ref 6.5–8.1)

## 2024-01-15 LAB — LIPASE, BLOOD: Lipase: 24 U/L (ref 11–51)

## 2024-01-15 LAB — GLUCOSE, CAPILLARY
Glucose-Capillary: 197 mg/dL — ABNORMAL HIGH (ref 70–99)
Glucose-Capillary: 137 mg/dL — ABNORMAL HIGH (ref 70–99)
Glucose-Capillary: 245 mg/dL — ABNORMAL HIGH (ref 70–99)
Glucose-Capillary: 307 mg/dL — ABNORMAL HIGH (ref 70–99)

## 2024-01-15 MED ORDER — PREDNISONE 20 MG PO TABS
40.0000 mg | ORAL_TABLET | Freq: Every day | ORAL | Status: DC
  Administered 2024-01-15 – 2024-01-17 (×3): 40 mg via ORAL
  Filled 2024-01-15 (×3): qty 2

## 2024-01-15 NOTE — Progress Notes (Signed)
 " PROGRESS NOTE    Anthony [name].  FMW:[id number] DOB: 04-Jun-1950 DOA: 01/07/2024 PCP: Donnella Moles    Brief Narrative:  74 year old with advanced COPD, coronary artery disease, paroxysmal A-fib presented from long-term care nursing home with shortness of breath and confusion.  He was found hypoxemic and initially placed on BiPAP.  Admitted for acute hypoxemic respiratory failure, CHF exacerbation, pneumonia with underlying COPD.  Followed by pulmonary and cardiology.  Diuresed.  Treated for pneumonia.  Despite optimal treatment, remains on high flow oxygen.  Subjective: Patient seen and examined.  Patient tells me that he is not having any trouble.  Everything I asked, he tells me that he forgot.  Denies any  shortness of breath himself.  Patient remains on 15 L nasal cannula oxygen saturating 88 to 92%.  Looks comfortable.  Blood sugars elevated.  Assessment & Plan:   Acute on chronic hypoxic respite failure due to combination of acute on chronic diastolic heart failure, COPD exacerbation and left-sided pneumonia: Presented hypoxemic and tachypneic requiring BiPAP.  Now on high flow nasal cannula oxygen.  Is still requiring 15 L oxygen but mostly asymptomatic. Flu, RSV and COVID-negative.  Blood cultures negative. Echocardiogram with ejection fraction 50 to 55%. Seen by cardiology, he was diuresed With IV diuresis, optimize volume status and now remains on Lasix  40 mg daily.  Cardiology signed off. Followed by pulmonary, patient has extensive bilateral emphysema, interstitial lung disease multiple lung nodules with 65-pack-year history of smoking.  Poor candidate and poor recovery. All symptomatic management with Brovana , DuoNebs, Incruse Ellipta . Patient on prednisone  for the last 7 days, will gradually taper off.  Will discharge patient on prolonged prednisone  taper and continue low-dose prednisone .  Abnormal CT scan of the chest with intrahepatic biliary ductal dilatation, suspected  distal biliary obstruction: Patient with severe respiratory compromise, asymptomatic biliary exam as well as normal LFTs.  Will not pursue further.  Paroxysmal A-fib: Currently in sinus rhythm.  Therapeutic on Eliquis .  Did not tolerate beta-blockers.  CAD status post RCA stent and hyperlipidemia: Stable.  On Eliquis .  Type 2 diabetes, hyperglycemia: On insulin  today.  At home on metformin  and glipizide .  Blood sugars elevated due to high-dose steroids.  Tapering off of steroids.  Continue insulin  today.  Chronic pain management: On gabapentin .  Mild cognitive impairment: Delirium precautions.  Peripheral artery disease: Status post left AKA.  Stable.  Goal of care: Previous attending documented goal of care discussion with POA patient's daughter. Hospice following. Plan is long-term care with hospice if able to wean off to 5 L oxygen. Otherwise patient is to go home with home hospice.    DVT prophylaxis: SCDs Start: 01/07/24 0527 apixaban  (ELIQUIS ) tablet 5 mg   Code Status: DNR/DNI Family Communication: None at the bedside.  Will communicate with patient's daughter. Disposition Plan: Status is: Inpatient Remains inpatient appropriate because: On high oxygen     Consultants:  Cardiology Pulmonary  Procedures:  None  Antimicrobials:  Completed     Objective: Vitals:   01/14/24 1953 01/14/24 2046 01/15/24 0544 01/15/24 0823  BP:  127/78 111/68   Pulse:  61 (!) 52   Resp:  19 18   Temp:  (!) 97.4 F (36.3 C) (!) 97.4 F (36.3 C)   TempSrc:  Oral Oral   SpO2: 92% 92% 90% 91%  Weight:      Height:        Intake/Output Summary (Last 24 hours) at 01/15/2024 1048 Last data filed at 01/15/2024 0547 Gross  per 24 hour  Intake 640 ml  Output 950 ml  Net -310 ml   Filed Weights   01/08/24 1700  Weight: 84.8 kg    Examination:  General exam: Appears calm and comfortable  .  Frail and debilitated.  Chronically sick looking.  Not in any distress.  Able to talk  in complete sentences. Patient is alert awake and oriented to himself and family.  He is slightly forgetful otherwise mostly oriented. Respiratory system: Clear to auscultation. Respiratory effort normal.  On 15 L of oxygen.  He has some upper airway sounds. Cardiovascular system: S1 & S2 heard, RRR.   Gastrointestinal system: Abdomen is nondistended, soft and nontender. No organomegaly or masses felt. Normal bowel sounds heard. Central nervous system: Alert and oriented. No focal neurological deficits. Gross generalized weakness. Left above-knee amputation stump clean and dry.    Data Reviewed: I have personally reviewed following labs and imaging studies  CBC: Recent Labs  Lab 01/09/24 0542  WBC 8.8  HGB 16.5  HCT 54.5*  MCV 89.3  PLT 223   Basic Metabolic Panel: Recent Labs  Lab 01/09/24 0542 01/10/24 0527 01/14/24 0523 01/15/24 0527  NA 140 136 138 140  K 3.9 4.0 4.2 4.1  CL 99 95* 99 99  CO2 30 30 30 31   GLUCOSE 161* 262* 180* 180*  BUN 20 28* 35* 29*  CREATININE 1.07 1.21 1.14 1.04  CALCIUM  9.6 9.2 9.4 9.6   GFR: Estimated Creatinine Clearance: 65.1 mL/min (by C-G formula based on SCr of 1.04 mg/dL). Liver Function Tests: Recent Labs  Lab 01/14/24 0523 01/15/24 0527  AST 21 16  ALT 20 20  ALKPHOS 49 52  BILITOT 0.4 0.6  PROT 6.6 7.1  ALBUMIN  3.8 4.0   Recent Labs  Lab 01/15/24 0527  LIPASE 24   No results for input(s): AMMONIA in the last 168 hours. Coagulation Profile: No results for input(s): INR, PROTIME in the last 168 hours. Cardiac Enzymes: No results for input(s): CKTOTAL, CKMB, CKMBINDEX, TROPONINI in the last 168 hours. BNP (last 3 results) Recent Labs    01/07/24 0045  PROBNP 740.0*   HbA1C: No results for input(s): HGBA1C in the last 72 hours. CBG: Recent Labs  Lab 01/14/24 0729 01/14/24 1110 01/14/24 1613 01/14/24 2044 01/15/24 0739  GLUCAP 197* 263* 323* 257* 197*   Lipid Profile: No results for  input(s): CHOL, HDL, LDLCALC, TRIG, CHOLHDL, LDLDIRECT in the last 72 hours. Thyroid  Function Tests: No results for input(s): TSH, T4TOTAL, FREET4, T3FREE, THYROIDAB in the last 72 hours. Anemia Panel: No results for input(s): VITAMINB12, FOLATE, FERRITIN, TIBC, IRON, RETICCTPCT in the last 72 hours. Sepsis Labs: No results for input(s): PROCALCITON, LATICACIDVEN in the last 168 hours.  Recent Results (from the past 240 hours)  Resp panel by RT-PCR (RSV, Flu A&B, Covid) Anterior Nasal Swab     Status: None   Collection Time: 01/07/24 12:25 AM   Specimen: Anterior Nasal Swab  Result Value Ref Range Status   SARS Coronavirus 2 by RT PCR NEGATIVE NEGATIVE Final    Comment: (NOTE) SARS-CoV-2 target nucleic acids are NOT DETECTED.  The SARS-CoV-2 RNA is generally detectable in upper respiratory specimens during the acute phase of infection. The lowest concentration of SARS-CoV-2 viral copies this assay can detect is 138 copies/mL. A negative result does not preclude SARS-Cov-2 infection and should not be used as the sole basis for treatment or other patient management decisions. A negative result may occur with  improper specimen collection/handling,  submission of specimen other than nasopharyngeal swab, presence of viral mutation(s) within the areas targeted by this assay, and inadequate number of viral copies(<138 copies/mL). A negative result must be combined with clinical observations, patient history, and epidemiological information. The expected result is Negative.  Fact Sheet for Patients:  bloggercourse.com  Fact Sheet for Healthcare Providers:  seriousbroker.it  This test is no t yet approved or cleared by the [place]  FDA and  has been authorized for detection and/or diagnosis of SARS-CoV-2 by FDA under an Emergency Use Authorization (EUA). This EUA will remain  in effect (meaning  this test can be used) for the duration of the COVID-19 declaration under Section 564(b)(1) of the Act, 21 U.S.C.section 360bbb-3(b)(1), unless the authorization is terminated  or revoked sooner.       Influenza A by PCR NEGATIVE NEGATIVE Final   Influenza B by PCR NEGATIVE NEGATIVE Final    Comment: (NOTE) The Xpert Xpress SARS-CoV-2/FLU/RSV plus assay is intended as an aid in the diagnosis of influenza from Nasopharyngeal swab specimens and should not be used as a sole basis for treatment. Nasal washings and aspirates are unacceptable for Xpert Xpress SARS-CoV-2/FLU/RSV testing.  Fact Sheet for Patients: bloggercourse.com  Fact Sheet for Healthcare Providers: seriousbroker.it  This test is not yet approved or cleared by the [place]  FDA and has been authorized for detection and/or diagnosis of SARS-CoV-2 by FDA under an Emergency Use Authorization (EUA). This EUA will remain in effect (meaning this test can be used) for the duration of the COVID-19 declaration under Section 564(b)(1) of the Act, 21 U.S.C. section 360bbb-3(b)(1), unless the authorization is terminated or revoked.     Resp Syncytial Virus by PCR NEGATIVE NEGATIVE Final    Comment: (NOTE) Fact Sheet for Patients: bloggercourse.com  Fact Sheet for Healthcare Providers: seriousbroker.it  This test is not yet approved or cleared by the [place]  FDA and has been authorized for detection and/or diagnosis of SARS-CoV-2 by FDA under an Emergency Use Authorization (EUA). This EUA will remain in effect (meaning this test can be used) for the duration of the COVID-19 declaration under Section 564(b)(1) of the Act, 21 U.S.C. section 360bbb-3(b)(1), unless the authorization is terminated or revoked.  Performed at [facility], [address], [place], [place] [address]           Radiology Studies: US  Abdomen Limited RUQ (LIVER/GB) Result Date: 01/13/2024 EXAM: Right Upper Quadrant Abdominal Ultrasound 01/13/2024 11:14:06 PM TECHNIQUE: Real-time ultrasonography of the right upper quadrant of the abdomen was performed. COMPARISON: CT angiogram chest same day. CT abdomen and pelvis 03/25/2018. CLINICAL HISTORY: Dilated bile duct. FINDINGS: LIVER: Liver has a diffusely heterogeneous echotexture and increased echogenicity. There is marked intrahepatic biliary ductal dilatation. No evidence of mass. Hepatopetal flow in the portal vein. BILIARY SYSTEM: The gallbladder is dilated. Gallbladder calculi are present measuring up to 7 mm. Sonographic Beverley sign is negative. The common bile duct is dilated measuring 13.9 mm. No pericholecystic fluid or wall thickening. RIGHT KIDNEY: No hydronephrosis. No echogenic calculi. No mass. PANCREAS: Visualized portions of the pancreas are unremarkable. OTHER: No right upper quadrant ascites. IMPRESSION: 1. Dilated gallbladder with cholelithiasis measuring up to 7 mm and negative sonographic Murphy sign. 2. Marked intrahepatic biliary ductal dilatation and dilated common bile duct measuring 13.9 mm. Findings are concerning for distal biliary obstruction. Further evaluation with MRCP or ERCP is recommended 3. Diffusely heterogeneous and echogenic liver, which can be seen with hepatic steatosis or other diffuse hepatocellular disease.  Electronically signed by: Greig Pique MD 01/13/2024 11:33 PM EST RP Workstation: [id number]   CT Angio Chest Pulmonary Embolism (PE) W or WO Contrast Result Date: 01/13/2024 CLINICAL DATA:  Hypoxia and altered mental status EXAM: CT ANGIOGRAPHY CHEST WITH CONTRAST TECHNIQUE: Multidetector CT imaging of the chest was performed using the standard protocol during bolus administration of intravenous contrast. Multiplanar CT image reconstructions and MIPs were obtained to evaluate the vascular anatomy. RADIATION DOSE  REDUCTION: This exam was performed according to the departmental dose-optimization program which includes automated exposure control, adjustment of the mA and/or kV according to patient size and/or use of iterative reconstruction technique. CONTRAST:  75mL OMNIPAQUE  IOHEXOL  350 MG/ML SOLN COMPARISON:  CT chest dated 01/10/2024 FINDINGS: Decreased sensitivity and specificity for detailed findings due to motion artifact. Cardiovascular: The study is adequate for the evaluation of pulmonary embolism. There are no filling defects in the central, lobar, segmental or subsegmental pulmonary artery branches to suggest acute pulmonary embolism. Great vessels are normal in course and caliber. Normal heart size. No significant pericardial fluid/thickening. Coronary artery calcifications and aortic atherosclerosis. Mediastinum/Nodes: Imaged thyroid  gland without nodules meeting criteria for imaging follow-up by size. Normal esophagus. No pathologically enlarged axillary, supraclavicular, mediastinal, or hilar lymph nodes. Lungs/Pleura: The central airways are patent. Imaging in the expiratory phase. Moderate paraseptal and centrilobular emphysema. Secretions within the distal main bronchi bilaterally. Increased ground-glass opacity and consolidation in the lower lobes bilaterally. No pneumothorax. No pleural effusion. Upper abdomen: Severe intrahepatic bile duct dilation with imaged upstream common bile duct measuring up to 15 mm. Partially imaged distended gallbladder appears to contain layering stones. No main ductal dilation in the imaged pancreatic body or tail. Musculoskeletal: No acute or abnormal lytic or blastic osseous lesions. Multilevel degenerative changes of the thoracic spine. Review of the MIP images confirms the above findings. IMPRESSION: 1. No evidence of pulmonary embolism. 2. Increased ground-glass opacity and consolidation in the lower lobes bilaterally, which may represent a combination of atelectasis and  aspiration/pneumonia. 3. Severe intrahepatic bile duct dilation with imaged upstream common bile duct measuring up to 15 mm. Partially imaged distended gallbladder appears to contain layering stones. Recommend correlation with liver function tests and right upper quadrant ultrasound examination. Consider MRCP when the patient is better able to follow breath holding instructions. Electronically Signed   By: Limin  Xu M.D.   On: 01/13/2024 13:52        Scheduled Meds:  apixaban   5 mg Oral BID   arformoterol   15 mcg Nebulization BID   And   umeclidinium bromide   1 puff Inhalation Daily   ascorbic acid   500 mg Oral Daily   atorvastatin   40 mg Oral Daily   erythromycin    Right Eye Q8H   escitalopram   10 mg Oral QHS   furosemide   40 mg Oral Daily   gabapentin   100 mg Oral BID   insulin  aspart  0-20 Units Subcutaneous TID WC   insulin  aspart  0-5 Units Subcutaneous QHS   insulin  aspart  4 Units Subcutaneous TID WC   insulin  glargine  15 Units Subcutaneous Daily   ipratropium-albuterol   3 mL Nebulization TID   melatonin  3 mg Oral QHS   olopatadine   1 drop Right Eye BID   predniSONE   40 mg Oral Q breakfast   traZODone   200 mg Oral QHS   Continuous Infusions:   LOS: 8 days      Sydell Prowell, MD [facility]   "

## 2024-01-15 NOTE — TOC Progression Note (Addendum)
 Transition of Care (TOC) - Progression Note   Patient Details  Name: Anthony [name]. MRN: [id number] Date of Birth: Jul 16, 1950  [facility]) CM/SW Contact  Duwaine GORMAN Aran, LCSW Phone Number: 01/15/2024, 10:22 AM  Clinical Narrative: Patient remains on 15L HFNC and will be unable to discharge back to [facility] until he is able to wean to 5L/min. Care management to follow.  Addendum: CSW notified by hospitalist and treatment team that attempts will be made to wean to a lower liter flow. CSW spoke with daughter to provide update. Per daughter, she spoke with hospice and patient does not meet inpatient hospice criteria at this time.  Expected Discharge Plan: (P) Long Term Nursing Home Barriers to Discharge: Continued Medical Work up  Expected Discharge Plan and Services In-house Referral: Clinical Social Work Discharge Planning Services: CM Consult Post Acute Care Choice: Nursing Home Living arrangements for the past 2 months: Skilled Nursing Facility  Social Drivers of Health (SDOH) Interventions SDOH Screenings   Food Insecurity: Patient Unable To Answer (01/08/2024)  Housing: Unknown (01/08/2024)  Transportation Needs: Patient Unable To Answer (01/08/2024)  Utilities: Patient Unable To Answer (01/08/2024)  Social Connections: Unknown (01/08/2024)  Tobacco Use: Medium Risk (01/07/2024)   Readmission Risk Interventions    01/12/2024    2:21 PM 08/15/2023   11:13 AM  Readmission Risk Prevention Plan  Transportation Screening Complete Complete  PCP or Specialist Appt within 3-5 Days Complete   HRI or Home Care Consult Complete   Social Work Consult for Recovery Care Planning/Counseling Complete   Palliative Care Screening Complete   Medication Review Oceanographer) Complete   SW Recovery Care/Counseling Consult  Complete  Palliative Care Screening  Complete  Skilled Nursing Facility  Complete

## 2024-01-15 NOTE — Plan of Care (Signed)
  Problem: Coping: Goal: Ability to adjust to condition or change in health will improve Outcome: Progressing   Problem: Fluid Volume: Goal: Ability to maintain a balanced intake and output will improve Outcome: Progressing   Problem: Education: Goal: Ability to describe self-care measures that may prevent or decrease complications (Diabetes Survival Skills Education) will improve Outcome: Not Progressing Goal: Individualized Educational Video(s) Outcome: Not Progressing   Problem: Health Behavior/Discharge Planning: Goal: Ability to identify and utilize available resources and services will improve Outcome: Not Progressing

## 2024-01-16 DIAGNOSIS — J441 Chronic obstructive pulmonary disease with (acute) exacerbation: Secondary | ICD-10-CM | POA: Diagnosis not present

## 2024-01-16 DIAGNOSIS — I5033 Acute on chronic diastolic (congestive) heart failure: Secondary | ICD-10-CM | POA: Diagnosis not present

## 2024-01-16 LAB — GLUCOSE, CAPILLARY
Glucose-Capillary: 156 mg/dL — ABNORMAL HIGH (ref 70–99)
Glucose-Capillary: 305 mg/dL — ABNORMAL HIGH (ref 70–99)
Glucose-Capillary: 187 mg/dL — ABNORMAL HIGH (ref 70–99)
Glucose-Capillary: 278 mg/dL — ABNORMAL HIGH (ref 70–99)

## 2024-01-16 MED ORDER — MELATONIN 3 MG PO TABS: 3.0000 mg | ORAL_TABLET | Freq: Every evening | ORAL | Status: AC | PRN

## 2024-01-16 MED ORDER — OXYCODONE HCL 5 MG PO TABS: 5.0000 mg | ORAL_TABLET | ORAL | 0 refills | Status: AC | PRN

## 2024-01-16 MED ORDER — FUROSEMIDE 20 MG PO TABS: 40.0000 mg | ORAL_TABLET | Freq: Every day | ORAL | Status: AC

## 2024-01-16 NOTE — Plan of Care (Signed)
" °  Problem: Coping: Goal: Ability to adjust to condition or change in health will improve Outcome: Progressing   Problem: Fluid Volume: Goal: Ability to maintain a balanced intake and output will improve Outcome: Progressing   Problem: Metabolic: Goal: Ability to maintain appropriate glucose levels will improve Outcome: Progressing   Problem: Nutritional: Goal: Maintenance of adequate nutrition will improve Outcome: Progressing Goal: Progress toward achieving an optimal weight will improve Outcome: Progressing   Problem: Skin Integrity: Goal: Risk for impaired skin integrity will decrease Outcome: Progressing   Problem: Tissue Perfusion: Goal: Adequacy of tissue perfusion will improve Outcome: Progressing   Problem: Clinical Measurements: Goal: Ability to maintain clinical measurements within normal limits will improve Outcome: Progressing Goal: Will remain free from infection Outcome: Progressing Goal: Diagnostic test results will improve Outcome: Progressing Goal: Respiratory complications will improve Outcome: Progressing Goal: Cardiovascular complication will be avoided Outcome: Progressing   Problem: Activity: Goal: Risk for activity intolerance will decrease Outcome: Progressing   Problem: Nutrition: Goal: Adequate nutrition will be maintained Outcome: Progressing   Problem: Coping: Goal: Level of anxiety will decrease Outcome: Progressing   Problem: Elimination: Goal: Will not experience complications related to bowel motility Outcome: Progressing Goal: Will not experience complications related to urinary retention Outcome: Progressing   Problem: Pain Managment: Goal: General experience of comfort will improve and/or be controlled Outcome: Progressing   "

## 2024-01-16 NOTE — TOC Progression Note (Signed)
 Transition of Care (TOC) - Progression Note    Patient Details  Name: Anthony [name]. MRN: [id number] Date of Birth: November 17, 1950  Transition of Care [facility]) CM/SW Contact  Sonda Manuella Quill, RN Phone Number: 01/16/2024, 3:31 PM  Clinical Narrative:    Pt remains on HFNC; IP CM following.   Expected Discharge Plan: (P) Long Term Nursing Home Barriers to Discharge: Continued Medical Work up               Expected Discharge Plan and Services In-house Referral: Clinical Social Work Discharge Planning Services: CM Consult Post Acute Care Choice: Nursing Home Living arrangements for the past 2 months: Skilled Nursing Facility                                       Social Drivers of Health (SDOH) Interventions SDOH Screenings   Food Insecurity: Patient Unable To Answer (01/08/2024)  Housing: Unknown (01/08/2024)  Transportation Needs: Patient Unable To Answer (01/08/2024)  Utilities: Patient Unable To Answer (01/08/2024)  Social Connections: Unknown (01/08/2024)  Tobacco Use: Medium Risk (01/07/2024)    Readmission Risk Interventions    01/12/2024    2:21 PM 08/15/2023   11:13 AM  Readmission Risk Prevention Plan  Transportation Screening Complete Complete  PCP or Specialist Appt within 3-5 Days Complete   HRI or Home Care Consult Complete   Social Work Consult for Recovery Care Planning/Counseling Complete   Palliative Care Screening Complete   Medication Review Oceanographer) Complete   SW Recovery Care/Counseling Consult  Complete  Palliative Care Screening  Complete  Skilled Nursing Facility  Complete

## 2024-01-16 NOTE — Progress Notes (Signed)
 [facility] Liaison Note   [facility] continues to follow for discharge disposition for hospice services at LTC.     Please call with any hospice related questions or concerns.   Eleanor Nail, LPN [facility] Liaison [phone]

## 2024-01-16 NOTE — Progress Notes (Signed)
 " PROGRESS NOTE    Anthony [name].  FMW:[id number] DOB: 12/20/1950 DOA: 01/07/2024 PCP: Donnella Moles    Brief Narrative:  74 year old with advanced COPD, coronary artery disease, paroxysmal A-fib presented from long-term care nursing home with shortness of breath and confusion.  He was found hypoxemic and initially placed on BiPAP.  Admitted for acute hypoxemic respiratory failure, CHF exacerbation, pneumonia with underlying COPD.  Followed by pulmonary and cardiology.  Diuresed.  Treated for pneumonia.  Despite optimal treatment, remains on high flow oxygen. Plan is to discharge back to nursing home with hospice in place, however still on oxygen more than 5 L that cannot be managed at the nursing home.  Subjective: Patient seen and examined.  On my multiple exam yesterday, patient will not keep his oxygen on his nose.  He looked very comfortable with saturation of 80% and most of the time not accurately recorded. Today morning, patient looks comfortable.  He denies any complaints.  Patient tells me that he is doing fine denies any shortness of breath.  On 7 to 8 L of oxygen and saturating about 90%.  Assessment & Plan:   Acute on chronic hypoxic respiratory failure due to combination of acute on chronic diastolic heart failure, COPD exacerbation and left-sided pneumonia: Presented hypoxemic and tachypneic requiring BiPAP.  Clinically stabilized but is still on high flow nasal and oxygen.  Mostly asymptomatic with hypoxemia.  Flu, RSV and COVID-negative.  Blood cultures negative. Echocardiogram with ejection fraction 50 to 55%. Seen by cardiology, he was diuresed and now on oral diuresis. With IV diuresis, optimize volume status and now remains on Lasix  40 mg daily.  Cardiology signed off. Followed by pulmonary, patient has extensive bilateral emphysema, interstitial lung disease multiple lung nodules with 65-pack-year history of smoking.  Poor candidate and poor recovery. All symptomatic  management with Brovana , DuoNebs, Incruse Ellipta . Patient on prednisone  for the last 7 days, will gradually taper off.  Will discharge patient on prolonged prednisone  taper and continue low-dose prednisone . Keep on oxygen to keep saturation more than 85%.  As he does not keep his oxygen in his nose it is challenging.  Focus on comfort.  Abnormal CT scan of the chest with intrahepatic biliary ductal dilatation, suspected distal biliary obstruction: Patient with severe respiratory compromise, asymptomatic biliary exam as well as normal LFTs.  Will not pursue further.  Paroxysmal A-fib: Currently in sinus rhythm.  Therapeutic on Eliquis .  Did not tolerate beta-blockers.  CAD status post RCA stent and hyperlipidemia: Stable.  On Eliquis .  Type 2 diabetes, hyperglycemia: On insulin  today.  At home on metformin  and glipizide .  Blood sugars elevated due to high-dose steroids.  Tapering off of steroids.  Continue insulin  today.  Will discharge on glipizide  and metformin .  Chronic pain management: On gabapentin .  Occasionally using opiates for pain relief.  Mild cognitive impairment: Delirium precautions.  Peripheral artery disease: Status post left AKA.  Stable.  Goal of care: Overall poor clinical status. Patient long-term care at the nursing home. Plan is to discharge him with hospice follow-up at nursing home.  Nursing home not able to manage more than 5 L of oxygen.  Wean off oxygen to keep saturation more than 85% with focus on comfort to facilitate discharge from the hospital.    DVT prophylaxis: SCDs Start: 01/07/24 0527 apixaban  (ELIQUIS ) tablet 5 mg   Code Status: DNR/DNI Family Communication: Daughter on the phone 1/7. Disposition Plan: Status is: Inpatient Remains inpatient appropriate because: On high oxygen  Consultants:  Cardiology Pulmonary  Procedures:  None  Antimicrobials:  Completed     Objective: Vitals:   01/15/24 1433 01/15/24 1952 01/16/24 0628  01/16/24 0826  BP:  122/71 120/73   Pulse:  63 (!) 55   Resp:   16   Temp:  97.7 F (36.5 C) 98.2 F (36.8 C)   TempSrc:  Oral Oral   SpO2: (!) 81% (!) 85% (!) 86% 91%  Weight:      Height:        Intake/Output Summary (Last 24 hours) at 01/16/2024 0950 Last data filed at 01/16/2024 0643 Gross per 24 hour  Intake 840 ml  Output 1350 ml  Net -510 ml   Filed Weights   01/08/24 1700  Weight: 84.8 kg    Examination:  General exam: Appears calm and comfortable.  Chronically sick looking.  Not in any distress.  Able to talk in complete sentences.  He is oriented to himself and his family. Respiratory system: Clear to auscultation. Respiratory effort normal.  On 8 L of oxygen.  He has some upper airway sounds.  He was not wearing oxygen on my exam. Cardiovascular system: S1 & S2 heard, RRR.   Gastrointestinal system: Abdomen is nondistended, soft and nontender. No organomegaly or masses felt. Normal bowel sounds heard. Central nervous system: Alert and oriented. No focal neurological deficits. Gross generalized weakness. Left above-knee amputation stump clean and dry.    Data Reviewed: I have personally reviewed following labs and imaging studies  CBC: No results for input(s): WBC, NEUTROABS, HGB, HCT, MCV, PLT in the last 168 hours.  Basic Metabolic Panel: Recent Labs  Lab 01/10/24 0527 01/14/24 0523 01/15/24 0527  NA 136 138 140  K 4.0 4.2 4.1  CL 95* 99 99  CO2 30 30 31   GLUCOSE 262* 180* 180*  BUN 28* 35* 29*  CREATININE 1.21 1.14 1.04  CALCIUM  9.2 9.4 9.6   GFR: Estimated Creatinine Clearance: 65.1 mL/min (by C-G formula based on SCr of 1.04 mg/dL). Liver Function Tests: Recent Labs  Lab 01/14/24 0523 01/15/24 0527  AST 21 16  ALT 20 20  ALKPHOS 49 52  BILITOT 0.4 0.6  PROT 6.6 7.1  ALBUMIN  3.8 4.0   Recent Labs  Lab 01/15/24 0527  LIPASE 24   No results for input(s): AMMONIA in the last 168 hours. Coagulation Profile: No results  for input(s): INR, PROTIME in the last 168 hours. Cardiac Enzymes: No results for input(s): CKTOTAL, CKMB, CKMBINDEX, TROPONINI in the last 168 hours. BNP (last 3 results) Recent Labs    01/07/24 0045  PROBNP 740.0*   HbA1C: No results for input(s): HGBA1C in the last 72 hours. CBG: Recent Labs  Lab 01/15/24 0739 01/15/24 1133 01/15/24 1702 01/15/24 1952 01/16/24 0728  GLUCAP 197* 137* 245* 307* 156*   Lipid Profile: No results for input(s): CHOL, HDL, LDLCALC, TRIG, CHOLHDL, LDLDIRECT in the last 72 hours. Thyroid  Function Tests: No results for input(s): TSH, T4TOTAL, FREET4, T3FREE, THYROIDAB in the last 72 hours. Anemia Panel: No results for input(s): VITAMINB12, FOLATE, FERRITIN, TIBC, IRON, RETICCTPCT in the last 72 hours. Sepsis Labs: No results for input(s): PROCALCITON, LATICACIDVEN in the last 168 hours.  Recent Results (from the past 240 hours)  Resp panel by RT-PCR (RSV, Flu A&B, Covid) Anterior Nasal Swab     Status: None   Collection Time: 01/07/24 12:25 AM   Specimen: Anterior Nasal Swab  Result Value Ref Range Status   SARS Coronavirus 2 by RT  PCR NEGATIVE NEGATIVE Final    Comment: (NOTE) SARS-CoV-2 target nucleic acids are NOT DETECTED.  The SARS-CoV-2 RNA is generally detectable in upper respiratory specimens during the acute phase of infection. The lowest concentration of SARS-CoV-2 viral copies this assay can detect is 138 copies/mL. A negative result does not preclude SARS-Cov-2 infection and should not be used as the sole basis for treatment or other patient management decisions. A negative result may occur with  improper specimen collection/handling, submission of specimen other than nasopharyngeal swab, presence of viral mutation(s) within the areas targeted by this assay, and inadequate number of viral copies(<138 copies/mL). A negative result must be combined with clinical observations,  patient history, and epidemiological information. The expected result is Negative.  Fact Sheet for Patients:  bloggercourse.com  Fact Sheet for Healthcare Providers:  seriousbroker.it  This test is no t yet approved or cleared by the [place]  FDA and  has been authorized for detection and/or diagnosis of SARS-CoV-2 by FDA under an Emergency Use Authorization (EUA). This EUA will remain  in effect (meaning this test can be used) for the duration of the COVID-19 declaration under Section 564(b)(1) of the Act, 21 U.S.C.section 360bbb-3(b)(1), unless the authorization is terminated  or revoked sooner.       Influenza A by PCR NEGATIVE NEGATIVE Final   Influenza B by PCR NEGATIVE NEGATIVE Final    Comment: (NOTE) The Xpert Xpress SARS-CoV-2/FLU/RSV plus assay is intended as an aid in the diagnosis of influenza from Nasopharyngeal swab specimens and should not be used as a sole basis for treatment. Nasal washings and aspirates are unacceptable for Xpert Xpress SARS-CoV-2/FLU/RSV testing.  Fact Sheet for Patients: bloggercourse.com  Fact Sheet for Healthcare Providers: seriousbroker.it  This test is not yet approved or cleared by the [place]  FDA and has been authorized for detection and/or diagnosis of SARS-CoV-2 by FDA under an Emergency Use Authorization (EUA). This EUA will remain in effect (meaning this test can be used) for the duration of the COVID-19 declaration under Section 564(b)(1) of the Act, 21 U.S.C. section 360bbb-3(b)(1), unless the authorization is terminated or revoked.     Resp Syncytial Virus by PCR NEGATIVE NEGATIVE Final    Comment: (NOTE) Fact Sheet for Patients: bloggercourse.com  Fact Sheet for Healthcare Providers: seriousbroker.it  This test is not yet approved or cleared by the [place]  [place] FDA and has been authorized for detection and/or diagnosis of SARS-CoV-2 by FDA under an Emergency Use Authorization (EUA). This EUA will remain in effect (meaning this test can be used) for the duration of the COVID-19 declaration under Section 564(b)(1) of the Act, 21 U.S.C. section 360bbb-3(b)(1), unless the authorization is terminated or revoked.  Performed at [facility], [address] [place], [place] [address]          Radiology Studies: No results found.       Scheduled Meds:  apixaban   5 mg Oral BID   arformoterol   15 mcg Nebulization BID   And   umeclidinium bromide   1 puff Inhalation Daily   ascorbic acid   500 mg Oral Daily   atorvastatin   40 mg Oral Daily   erythromycin    Right Eye Q8H   escitalopram   10 mg Oral QHS   furosemide   40 mg Oral Daily   gabapentin   100 mg Oral BID   insulin  aspart  0-20 Units Subcutaneous TID WC   insulin  aspart  0-5 Units Subcutaneous QHS   insulin  aspart  4 Units Subcutaneous  TID WC   insulin  glargine  15 Units Subcutaneous Daily   ipratropium-albuterol   3 mL Nebulization TID   melatonin  3 mg Oral QHS   olopatadine   1 drop Right Eye BID   predniSONE   40 mg Oral Q breakfast   traZODone   200 mg Oral QHS   Continuous Infusions:   LOS: 9 days      Renato Applebaum, MD [facility]   "

## 2024-01-17 LAB — GLUCOSE, CAPILLARY
Glucose-Capillary: 190 mg/dL — ABNORMAL HIGH (ref 70–99)
Glucose-Capillary: 256 mg/dL — ABNORMAL HIGH (ref 70–99)
Glucose-Capillary: 230 mg/dL — ABNORMAL HIGH (ref 70–99)
Glucose-Capillary: 407 mg/dL — ABNORMAL HIGH (ref 70–99)

## 2024-01-17 MED ORDER — INSULIN ASPART 100 UNIT/ML IJ SOLN
6.0000 [IU] | Freq: Once | INTRAMUSCULAR | Status: AC
  Administered 2024-01-17: 6 [IU] via SUBCUTANEOUS
  Filled 2024-01-17: qty 6

## 2024-01-17 MED ORDER — PREDNISONE 5 MG PO TABS
30.0000 mg | ORAL_TABLET | Freq: Every day | ORAL | Status: DC
  Administered 2024-01-18: 30 mg via ORAL
  Filled 2024-01-17: qty 2

## 2024-01-17 NOTE — Plan of Care (Signed)
  Problem: Health Behavior/Discharge Planning: Goal: Ability to identify and utilize available resources and services will improve Outcome: Progressing Goal: Ability to manage health-related needs will improve Outcome: Progressing   Problem: Fluid Volume: Goal: Ability to maintain a balanced intake and output will improve Outcome: Progressing   Problem: Coping: Goal: Ability to adjust to condition or change in health will improve Outcome: Progressing

## 2024-01-17 NOTE — Progress Notes (Signed)
 " PROGRESS NOTE    Anthony [name].  FMW:[id number] DOB: 1950/02/04 DOA: 01/07/2024 PCP: Donnella Moles    Brief Narrative:  74 year old with advanced COPD, coronary artery disease, paroxysmal A-fib presented from long-term care nursing home with shortness of breath and confusion.  He was found hypoxemic and initially placed on BiPAP.  Admitted for acute hypoxemic respiratory failure, CHF exacerbation, pneumonia with underlying COPD.  Followed by pulmonary and cardiology.  Diuresed.  Treated for pneumonia.  Despite optimal treatment, remains on high flow oxygen. Plan is to discharge back to nursing home with hospice in place, however nursing home will only accept when patient is on 5 L or less of the oxygen even though he is going with hospice.    Subjective: Seen and examined.  Denies any complaints.  Looks comfortable.  Goal of oxygen saturation was 85% yesterday, however overnight he is up on 7 to 8 L of oxygen. Patient titrated down to 5 L oxygen, goal is 85% and comfort.  Do not escalate dose of oxygen. Nursing home in contact, they would like to see patient stay on 5 L of oxygen next 24 hours before discharge.  Assessment & Plan:   Acute on chronic hypoxic respiratory failure due to combination of acute on chronic diastolic heart failure, COPD exacerbation and left-sided pneumonia: Presented hypoxemic and tachypneic requiring BiPAP.  Clinically stabilized but is still on high flow nasal and oxygen.  Mostly asymptomatic with hypoxemia.  Flu, RSV and COVID-negative.  Blood cultures negative. Echocardiogram with ejection fraction 50 to 55%. Seen by cardiology, he was diuresed and now on oral diuresis. With IV diuresis, optimize volume status and now remains on Lasix  40 mg daily.  Cardiology signed off. Followed by pulmonary, patient has extensive bilateral emphysema, interstitial lung disease multiple lung nodules with 65-pack-year history of smoking.  Poor candidate and poor  recovery. All symptomatic management with Brovana , DuoNebs, Incruse Ellipta . Patient on prednisone  for the last 7 days, will gradually taper off.  Will discharge patient on prolonged prednisone  taper and continue low-dose prednisone . Keep on oxygen to keep saturation more than 85%.  As he does not keep his oxygen in his nose it is challenging.  Focus on comfort. Keep on 5 L oxygen today, do not escalate.  Abnormal CT scan of the chest with intrahepatic biliary ductal dilatation, suspected distal biliary obstruction: Patient with severe respiratory compromise, asymptomatic biliary exam as well as normal LFTs.  Will not pursue further.  Paroxysmal A-fib: Currently in sinus rhythm.  Therapeutic on Eliquis .  Did not tolerate beta-blockers.  CAD status post RCA stent and hyperlipidemia: Stable.  On Eliquis .  Type 2 diabetes, hyperglycemia: On insulin  today.  At home on metformin  and glipizide .  Blood sugars elevated due to high-dose steroids.  Tapering off of steroids.  Continue insulin  today.  Will discharge on glipizide  and metformin .  Chronic pain management: On gabapentin .  Occasionally using opiates for pain relief.  Mild cognitive impairment: Delirium precautions.  Peripheral artery disease: Status post left AKA.  Stable.  Goal of care: Overall poor clinical status. Patient long-term care at the nursing home. Plan is to discharge him with hospice follow-up at nursing home.  Nursing home not able to manage more than 5 L of oxygen.  Wean off oxygen to keep saturation more than 85% with focus on comfort to facilitate discharge from the hospital.    DVT prophylaxis: SCDs Start: 01/07/24 0527 apixaban  (ELIQUIS ) tablet 5 mg   Code Status: DNR/DNI Family Communication: Daughter  on the phone 1/7. Disposition Plan: Status is: Inpatient Remains inpatient appropriate because: Weaning off oxygen.  Likely back to nursing home tomorrow with hospice.     Consultants:   Cardiology Pulmonary  Procedures:  None  Antimicrobials:  Completed     Objective: Vitals:   01/17/24 0633 01/17/24 0758 01/17/24 0828 01/17/24 1203  BP: 93/66     Pulse: (!) 55     Resp: 16     Temp: 98.2 F (36.8 C)     TempSrc: Oral     SpO2: 94% 90% (!) 85% (!) 87%  Weight:      Height:        Intake/Output Summary (Last 24 hours) at 01/17/2024 1246 Last data filed at 01/17/2024 9171 Gross per 24 hour  Intake --  Output 2650 ml  Net -2650 ml   Filed Weights   01/08/24 1700  Weight: 84.8 kg    Examination:  General exam: Chronically sick looking.  Looks fairly comfortable.  Able to talk in complete sentences.  Forgetful.  Oriented to himself and family. Respiratory system: Clear to auscultation. Respiratory effort normal.  On 5 L of oxygen.  He has some upper airway sounds.   Cardiovascular system: S1 & S2 heard, RRR.   Gastrointestinal system: Abdomen is nondistended, soft and nontender. No organomegaly or masses felt. Normal bowel sounds heard. Central nervous system: Alert and oriented. No focal neurological deficits. Gross generalized weakness. Left above-knee amputation stump clean and dry.    Data Reviewed: I have personally reviewed following labs and imaging studies  CBC: No results for input(s): WBC, NEUTROABS, HGB, HCT, MCV, PLT in the last 168 hours.  Basic Metabolic Panel: Recent Labs  Lab 01/14/24 0523 01/15/24 0527  NA 138 140  K 4.2 4.1  CL 99 99  CO2 30 31  GLUCOSE 180* 180*  BUN 35* 29*  CREATININE 1.14 1.04  CALCIUM  9.4 9.6   GFR: Estimated Creatinine Clearance: 65.1 mL/min (by C-G formula based on SCr of 1.04 mg/dL). Liver Function Tests: Recent Labs  Lab 01/14/24 0523 01/15/24 0527  AST 21 16  ALT 20 20  ALKPHOS 49 52  BILITOT 0.4 0.6  PROT 6.6 7.1  ALBUMIN  3.8 4.0   Recent Labs  Lab 01/15/24 0527  LIPASE 24   No results for input(s): AMMONIA in the last 168 hours. Coagulation Profile: No  results for input(s): INR, PROTIME in the last 168 hours. Cardiac Enzymes: No results for input(s): CKTOTAL, CKMB, CKMBINDEX, TROPONINI in the last 168 hours. BNP (last 3 results) Recent Labs    01/07/24 0045  PROBNP 740.0*   HbA1C: No results for input(s): HGBA1C in the last 72 hours. CBG: Recent Labs  Lab 01/16/24 1123 01/16/24 1627 01/16/24 2015 01/17/24 0751 01/17/24 1124  GLUCAP 305* 187* 278* 190* 256*   Lipid Profile: No results for input(s): CHOL, HDL, LDLCALC, TRIG, CHOLHDL, LDLDIRECT in the last 72 hours. Thyroid  Function Tests: No results for input(s): TSH, T4TOTAL, FREET4, T3FREE, THYROIDAB in the last 72 hours. Anemia Panel: No results for input(s): VITAMINB12, FOLATE, FERRITIN, TIBC, IRON, RETICCTPCT in the last 72 hours. Sepsis Labs: No results for input(s): PROCALCITON, LATICACIDVEN in the last 168 hours.  No results found for this or any previous visit (from the past 240 hours).        Radiology Studies: No results found.       Scheduled Meds:  apixaban   5 mg Oral BID   arformoterol   15 mcg Nebulization BID  And   umeclidinium bromide   1 puff Inhalation Daily   ascorbic acid   500 mg Oral Daily   atorvastatin   40 mg Oral Daily   erythromycin    Right Eye Q8H   escitalopram   10 mg Oral QHS   furosemide   40 mg Oral Daily   gabapentin   100 mg Oral BID   insulin  aspart  0-20 Units Subcutaneous TID WC   insulin  aspart  0-5 Units Subcutaneous QHS   insulin  aspart  4 Units Subcutaneous TID WC   insulin  glargine  15 Units Subcutaneous Daily   ipratropium-albuterol   3 mL Nebulization TID   melatonin  3 mg Oral QHS   olopatadine   1 drop Right Eye BID   [START ON 01/18/2024] predniSONE   30 mg Oral Q breakfast   traZODone   200 mg Oral QHS   Continuous Infusions:   LOS: 10 days      Zanayah Shadowens, MD [facility]   "

## 2024-01-17 NOTE — [place] FL2 (Signed)
 " [place]  MEDICAID FL2 LEVEL OF CARE FORM     IDENTIFICATION  Patient Name: Anthony [name]. Birthdate: [birth year] Sex: male Admission Date (Current Location): 01/07/2024  [place] and [place] Number:  [place] [id number] [facility] Facility and Address:  [facility],  [address], [place] [address]      Provider Number: [id number]  Attending Physician Name and Address:  Raenelle Coria, MD  Relative Name and Phone Number:  [name] (daughter) Ph: [phone]    Current Level of Care: Hospital Recommended Level of Care: Nursing Facility Prior Approval Number:    Date Approved/Denied:   PASRR Number: [id number]  Discharge Plan: SNF    Current Diagnoses: Patient Active Problem List   Diagnosis Date Noted   COPD with chronic bronchitis and emphysema (HCC) 01/13/2024   S/P AKA (above knee amputation), left (HCC) 01/09/2024   CHF exacerbation (HCC) 01/07/2024   Chronic anemia 08/15/2023   Zoster 08/14/2023   AKI (acute kidney injury) 08/14/2023   Acute metabolic encephalopathy 08/14/2023   Typical atrial flutter (HCC) 08/02/2023   Paroxysmal atrial fibrillation (HCC) 08/02/2023   CAP (community acquired pneumonia) 07/29/2023   Acute on chronic diastolic CHF (congestive heart failure) (HCC) 07/26/2023   Acute respiratory failure with hypoxia (HCC) 07/26/2023   ILD (interstitial lung disease) (HCC) 03/28/2021   Paroxysmal atrial fibrillation with RVR (HCC) 02/21/2021   Pressure injury of skin 02/13/2021   Rib fractures 02/13/2021   History of DVT (deep vein thrombosis) 02/13/2021   HTN (hypertension) 02/13/2021   Chronic respiratory failure with hypoxia (HCC) 02/12/2021   Hypokalemia 02/12/2021   Bipolar 1 disorder (HCC) 02/12/2021   Right flank pain 11/01/2019   Fall against object 09/25/2019   Cerebellar ataxia in diseases classified elsewhere (HCC) 08/26/2019   Chronic pain of right knee 07/14/2019   Chronic constipation 03/24/2019   Spinal  stenosis of lumbar region with neurogenic claudication 11/14/2018   Vitamin D  deficiency 09/05/2018   Elevated vitamin B12 level 09/05/2018   Acute neutrophilia 09/05/2018   Chronic fatigue 08/29/2018   Generalized weakness 08/29/2018   Multiple lung nodules 08/03/2018   Insomnia 03/26/2017   Aortic atherosclerosis 10/19/2016   Pain medication agreement signed 06/05/2016   DM2 (diabetes mellitus, type 2) (HCC) 05/25/2016   Obesity (BMI 30-39.9) 04/13/2016   Rectal bleeding 11/23/2015   Moderate episode of recurrent major depressive disorder (HCC) 10/18/2015   S/P right THA, AA 05/10/2015   Recurrent left inguinal hernia 07/26/2014   Chronic right hip pain 06/23/2014   S/P bilateral inguinal hernia repair 10/30/2013   Bilateral inguinal hernia 05/28/2013   Umbilical hernia 05/28/2013   Mood disorder (HCC)    CAD (coronary artery disease)    Type 2 diabetes mellitus with hyperlipidemia (HCC)    COPD with acute exacerbation (HCC)    Tobacco abuse     Orientation RESPIRATION BLADDER Height & Weight     Self, Time, Place  O2 (5LNC) Incontinent Weight: 84.8 kg Height:  5' 8 (172.7 cm)  BEHAVIORAL SYMPTOMS/MOOD NEUROLOGICAL BOWEL NUTRITION STATUS      Continent Diet (Regular diet)  AMBULATORY STATUS COMMUNICATION OF NEEDS Skin   Extensive Assist Verbally Other (Comment) (Ecchymosis: bilateral arms; Erythema: back, groin, sacrum, buttocks)                       Personal Care Assistance Level of Assistance  Bathing, Feeding, Dressing Bathing Assistance: Maximum assistance Feeding assistance: Limited assistance Dressing Assistance: Maximum assistance     Functional  Limitations Info  Sight, Hearing, Speech Sight Info: Adequate Hearing Info: Adequate Speech Info: Adequate    SPECIAL CARE FACTORS FREQUENCY                       Contractures Contractures Info: Not present    Additional Factors Info  Code Status, Allergies Code Status Info: DNR Allergies  Info: Flomax (Tamsulosin Hcl), Penicillins           Current Medications (01/17/2024):  This is the current hospital active medication list Current Facility-Administered Medications  Medication Dose Route Frequency Provider Last Rate Last Admin   acetaminophen  (TYLENOL ) tablet 650 mg  650 mg Oral Q6H PRN Dena Charleston, MD   650 mg at 01/11/24 9156   Or   acetaminophen  (TYLENOL ) suppository 650 mg  650 mg Rectal Q6H PRN Dena Charleston, MD       apixaban  (ELIQUIS ) tablet 5 mg  5 mg Oral BID Dorrell, Robert, MD   5 mg at 01/17/24 9057   arformoterol  (BROVANA ) nebulizer solution 15 mcg  15 mcg Nebulization BID Sreeram, Narendranath, MD   15 mcg at 01/17/24 0756   And   umeclidinium bromide  (INCRUSE ELLIPTA ) 62.5 MCG/ACT 1 puff  1 puff Inhalation Daily Sreeram, Narendranath, MD   1 puff at 01/17/24 0757   artificial tears ophthalmic solution 1 drop  1 drop Right Eye PRN Jadine Toribio SQUIBB, MD   1 drop at 01/15/24 2130   ascorbic acid  (VITAMIN C ) tablet 500 mg  500 mg Oral Daily Jadine Toribio SQUIBB, MD   500 mg at 01/17/24 9057   atorvastatin  (LIPITOR) tablet 40 mg  40 mg Oral Daily Dorrell, Robert, MD   40 mg at 01/17/24 0941   erythromycin  ophthalmic ointment   Right Eye Q8H Jadine Toribio SQUIBB, MD   Given at 01/17/24 [phone]   escitalopram  (LEXAPRO ) tablet 10 mg  10 mg Oral QHS Dorrell, Robert, MD   10 mg at 01/16/24 2138   furosemide  (LASIX ) tablet 40 mg  40 mg Oral Daily Parcells, Taylor A, PA-C   40 mg at 01/17/24 0941   gabapentin  (NEURONTIN ) capsule 100 mg  100 mg Oral BID Dena Charleston, MD   100 mg at 01/17/24 9057   guaiFENesin -dextromethorphan  (ROBITUSSIN DM) 100-10 MG/5ML syrup 5 mL  5 mL Oral Q4H PRN Jadine Toribio SQUIBB, MD   5 mL at 01/16/24 2140   insulin  aspart (novoLOG ) injection 0-20 Units  0-20 Units Subcutaneous TID WC Jadine Toribio SQUIBB, MD   11 Units at 01/17/24 1154   insulin  aspart (novoLOG ) injection 0-5 Units  0-5 Units Subcutaneous QHS Jadine Toribio SQUIBB, MD   3 Units at  01/16/24 2145   insulin  aspart (novoLOG ) injection 4 Units  4 Units Subcutaneous TID WC Jadine Toribio SQUIBB, MD   4 Units at 01/17/24 1154   insulin  glargine (LANTUS ) injection 15 Units  15 Units Subcutaneous Daily Jadine Toribio SQUIBB, MD   15 Units at 01/17/24 9167   ipratropium-albuterol  (DUONEB) 0.5-2.5 (3) MG/3ML nebulizer solution 3 mL  3 mL Nebulization Q6H PRN Darci Pore, MD   3 mL at 01/13/24 0559   ipratropium-albuterol  (DUONEB) 0.5-2.5 (3) MG/3ML nebulizer solution 3 mL  3 mL Nebulization TID Jadine Toribio SQUIBB, MD   3 mL at 01/17/24 0756   melatonin tablet 3 mg  3 mg Oral QHS Jadine Toribio SQUIBB, MD   3 mg at 01/16/24 2138   olopatadine  (PATANOL) 0.1 % ophthalmic solution 1 drop  1 drop Right Eye  BID Jadine Toribio SQUIBB, MD   1 drop at 01/17/24 9056   oxyCODONE  (Oxy IR/ROXICODONE ) immediate release tablet 5 mg  5 mg Oral Q4H PRN Dena Charleston, MD   5 mg at 01/08/24 1255   predniSONE  (DELTASONE ) tablet 40 mg  40 mg Oral Q breakfast Ghimire, Kuber, MD   40 mg at 01/17/24 9057   traZODone  (DESYREL ) tablet 200 mg  200 mg Oral QHS Dorrell, Robert, MD   200 mg at 01/16/24 2138     Discharge Medications: Please see discharge summary for a list of discharge medications.  Relevant Imaging Results:  Relevant Lab Results:   Additional Information SSN: [id number]  Sonda Manuella Quill, RN     "

## 2024-01-17 NOTE — TOC Progression Note (Addendum)
 Transition of Care (TOC) - Progression Note    Patient Details  Name: Anthony [name]. MRN: [id number] Date of Birth: 1950-08-01  Transition of [facility]) CM/SW Contact  Sonda Manuella Quill, RN Phone Number: 01/17/2024, 10:55 AM  Clinical Narrative:    Notified by Dr Raenelle pt is ready for d/c back to long-term care at [facility]; pt on 7L HFNC; Dr Raenelle said oxygen is for comfort only; LVM for [facility] at facility to see if pt can return; awaiting return call; also pt followed by [facility] hospice at facility.  -1117- notified by [place] that facility'sr equipment only goes up to 5 L, and pt cannot be left on cylinder oxygen that is used for medical emergencies; E[place] said she will clarify w/ facility's administration and update this RN, CM; Dr Raenelle and Eleanor Nail, [facility] Liaison notified by secure chat.  -1218- clarified w/ Dr Raenelle pt's goal sat is greater than 85%; spoke w/ Erie at [place]; she was notified pt has been on 5L [place] since 1203, with goal sats greater than 85%; she will check w/ administration to see if pt can return of if recc is that pt be monitored at [facility] before returning to facility; she will notify this RN, CM of administration's response; Dr Raenelle notified via secure chat.  -1229- spoke w/ Bethany, RN at facility; she said pt must be monitored at 5 L until tomorrow to ensure he can maintain sats at current liter flow,  and orders must read oxygen sats at 85%, and 5L is pt's baseline; Dr Raenelle notified via secure chat.   Expected Discharge Plan: (P) Long Term Nursing Home Barriers to Discharge: Continued Medical Work up               Expected Discharge Plan and Services In-house Referral: Clinical Social Work Discharge Planning Services: CM Consult Post Acute Care Choice: Nursing Home Living arrangements for the past 2 months: Skilled Nursing Facility                                       Social Drivers of Health (SDOH)  Interventions SDOH Screenings   Food Insecurity: Patient Unable To Answer (01/08/2024)  Housing: Unknown (01/08/2024)  Transportation Needs: Patient Unable To Answer (01/08/2024)  Utilities: Patient Unable To Answer (01/08/2024)  Social Connections: Unknown (01/08/2024)  Tobacco Use: Medium Risk (01/07/2024)    Readmission Risk Interventions    01/12/2024    2:21 PM 08/15/2023   11:13 AM  Readmission Risk Prevention Plan  Transportation Screening Complete Complete  PCP or Specialist Appt within 3-5 Days Complete   HRI or Home Care Consult Complete   Social Work Consult for Recovery Care Planning/Counseling Complete   Palliative Care Screening Complete   Medication Review Oceanographer) Complete   SW Recovery Care/Counseling Consult  Complete  Palliative Care Screening  Complete  Skilled Nursing Facility  Complete

## 2024-01-17 NOTE — Progress Notes (Signed)
 [facility] Liaison Note   [facility] continues to follow for discharge disposition for hospice services at LTC.     Please call with any hospice related questions or concerns.   Eleanor Nail, LPN [facility] Liaison [phone]

## 2024-01-17 NOTE — Plan of Care (Signed)
" °  Problem: Coping: Goal: Ability to adjust to condition or change in health will improve Outcome: Not Progressing   Problem: Clinical Measurements: Goal: Respiratory complications will improve Outcome: Not Progressing   "

## 2024-01-17 NOTE — Inpatient Diabetes Management (Signed)
 Inpatient Diabetes Program Recommendations  AACE/ADA: New Consensus Statement on Inpatient Glycemic Control (2015)  Target Ranges:  Prepandial:   less than 140 mg/dL      Peak postprandial:   less than 180 mg/dL (1-2 hours)      Critically ill patients:  140 - 180 mg/dL   Lab Results  Component Value Date   GLUCAP 190 (H) 01/17/2024   HGBA1C 7.8 (H) 01/07/2024    Review of Glycemic Control  Latest Reference Range & Units 01/16/24 07:28 01/16/24 11:23 01/16/24 16:27 01/16/24 20:15 01/17/24 07:51  Glucose-Capillary 70 - 99 mg/dL 843 (H) 694 (H) 812 (H) 278 (H) 190 (H)  (H): Data is abnormally high  Diabetes history: DM2 Outpatient Diabetes medications:  Glipizide  10 mg every day, Metformin  1000 mg every day, Rybelsus  7 mg QD Current orders for Inpatient glycemic control:  Lantus  15 units every day Novolog  0-20 units TID and 0-5 units at bedtime Novolog  4 units TID Solumedrol 40 mg QD  Inpatient Diabetes Program Recommendations:    Please consider:  Novolog  6 units TID with meals if he consumes at least 50%.  Thank you, Wyvonna Pinal, MSN, CDCES Diabetes Coordinator Inpatient Diabetes Program [phone] (team pager from 8a-5p)

## 2024-01-18 LAB — GLUCOSE, CAPILLARY
Glucose-Capillary: 172 mg/dL — ABNORMAL HIGH (ref 70–99)
Glucose-Capillary: 309 mg/dL — ABNORMAL HIGH (ref 70–99)

## 2024-01-18 MED ORDER — PREDNISONE 10 MG PO TABS: 30.0000 mg | ORAL_TABLET | Freq: Every day | ORAL | Status: AC

## 2024-01-18 NOTE — TOC Progression Note (Signed)
 Transition of Care (TOC) - Progression Note    Patient Details  Name: Anthony [name]. MRN: [id number] Date of Birth: January 02, 1951  Transition [facility] Care [facility]) CM/SW Contact  Sonda Manuella Quill, RN Phone Number: 01/18/2024, 9:23 AM  Clinical Narrative:    Notified by Dr Trixie pt stable on 5L [place] and is ready for d/c back to facility; LVM for Starr at [facility] to see if pt can still admit; awaiting return call.   Expected Discharge Plan: (P) Long Term Nursing Home Barriers to Discharge: Continued Medical Work up               Expected Discharge Plan and Services In-house Referral: Clinical Social Work Discharge Planning Services: CM Consult Post Acute Care Choice: Nursing Home Living arrangements for the past 2 months: Skilled Nursing Facility                                       Social Drivers of Health (SDOH) Interventions SDOH Screenings   Food Insecurity: Patient Unable To Answer (01/08/2024)  Housing: Unknown (01/08/2024)  Transportation Needs: Patient Unable To Answer (01/08/2024)  Utilities: Patient Unable To Answer (01/08/2024)  Social Connections: Unknown (01/08/2024)  Tobacco Use: Medium Risk (01/07/2024)    Readmission Risk Interventions    01/12/2024    2:21 PM 08/15/2023   11:13 AM  Readmission Risk Prevention Plan  Transportation Screening Complete Complete  PCP or Specialist Appt within 3-5 Days Complete   HRI or Home Care Consult Complete   Social Work Consult for Recovery Care Planning/Counseling Complete   Palliative Care Screening Complete   Medication Review Oceanographer) Complete   SW Recovery Care/Counseling Consult  Complete  Palliative Care Screening  Complete  Skilled Nursing Facility  Complete

## 2024-01-18 NOTE — Discharge Summary (Signed)
 "  Physician Discharge Summary  Anthony [name]. FMW:[id number] DOB: 12/06/1950 DOA: 01/07/2024  PCP: Donnella Moles  Admit date: 01/07/2024 Discharge date: 01/18/2024  Admitted From: SNF Disposition:  SNF with hospice  Recommendations for Outpatient Follow-up:  Follow up with PCP in 1-2 weeks Follow up with hospice  Home Health: none Equipment/Devices: none  Discharge Condition: stable CODE STATUS: DNR Diet Orders (From admission, onward)     Start     Ordered   01/13/24 1107  Diet regular Fluid consistency: Thin  Diet effective now       Question:  Fluid consistency:  Answer:  Thin   01/13/24 1106            Brief Narrative:  74 year old with advanced COPD, coronary artery disease, paroxysmal A-fib presented from long-term care nursing home with shortness of breath and confusion.  He was found hypoxemic and initially placed on BiPAP.  Admitted for acute hypoxemic respiratory failure, CHF exacerbation, pneumonia with underlying COPD.  Followed by pulmonary and cardiology.  Diuresed.  Treated for pneumonia.  Hospital Course / Discharge diagnoses: Principal problem Acute on chronic hypoxic respiratory failure due to combination of acute on chronic diastolic heart failure, COPD exacerbation and left-sided pneumonia - Presented hypoxemic and tachypneic requiring BiPAP.  Flu, RSV and COVID-negative.  Blood cultures negative. Echocardiogram with ejection fraction 50 to 55%. Seen by cardiology, he was diuresed and now on oral diuresis. Followed by pulmonary, patient has extensive bilateral emphysema, interstitial lung disease multiple lung nodules with 65-pack-year history of smoking.  Poor candidate and poor recovery. All symptomatic management at this point, continue prednisone  for few more days. Keep on oxygen to keep saturation more than 85%, focus on comfort.    Active problems Abnormal CT scan of the chest with intrahepatic biliary ductal dilatation, suspected distal biliary  obstruction: Patient with severe respiratory compromise, asymptomatic biliary exam as well as normal LFTs.  Will not pursue further. Clinically without symptoms, tolerating a regular diet Paroxysmal A-fib - Currently in sinus rhythm.  Therapeutic on Eliquis .  Did not tolerate beta-blockers. CAD status post RCA stent and hyperlipidemia - Stable.  On Eliquis . Type 2 diabetes, hyperglycemia - continue home meds Chronic pain management - On gabapentin .  Occasionally using opiates for pain relief. Mild cognitive impairment - Delirium precautions. Peripheral artery disease - Status post left AKA.  Stable. Goal of care - Overall poor clinical status. Patient long-term care at the nursing home. Plan is to discharge him with hospice follow-up at nursing home.   Sepsis ruled out   Discharge Instructions   Allergies as of 01/18/2024       Reactions   Flomax [tamsulosin Hcl] Nausea Only, Other (See Comments)   This medication makes patient feel sick and Allergic, per facility   Penicillins Hives, Other (See Comments)   Allergic, per facility        Medication List     TAKE these medications    acetaminophen  500 MG tablet Commonly known as: TYLENOL  Take 1,000 mg by mouth in the morning, at noon, and at bedtime.   albuterol  108 (90 Base) MCG/ACT inhaler Commonly known as: VENTOLIN  HFA Inhale 2 puffs into the lungs 3 (three) times daily as needed for wheezing.   apixaban  5 MG Tabs tablet Commonly known as: ELIQUIS  Take 1 tablet (5 mg total) by mouth 2 (two) times daily. What changed: when to take this   atorvastatin  40 MG tablet Commonly known as: LIPITOR Take 1 tablet (40 mg total) by  mouth daily. What changed: when to take this   Baclofen  5 MG Tabs Take 5 mg by mouth in the morning and at bedtime.   Benadryl  Allergy 25 MG tablet Generic drug: diphenhydrAMINE  Take 25 mg by mouth 2 (two) times daily as needed for allergies.   blood glucose meter kit and supplies  Kit Dispense based on patient and insurance preference. Use to check BG once daily or every other other.  Dx: E11.9   cyanocobalamin  1000 MCG tablet Take 1 tablet (1,000 mcg total) by mouth daily.   escitalopram  10 MG tablet Commonly known as: LEXAPRO  Take 1 tablet (10 mg total) by mouth at bedtime.   famotidine 40 MG tablet Commonly known as: PEPCID Take 40 mg by mouth at bedtime.   feeding supplement (GLUCERNA SHAKE) Liqd Take 237 mLs by mouth 3 (three) times daily between meals.   ferrous sulfate  325 (65 FE) MG tablet Take 1 tablet (325 mg total) by mouth daily with breakfast.   fexofenadine 180 MG tablet Commonly known as: ALLEGRA Take 180 mg by mouth at bedtime.   furosemide  20 MG tablet Commonly known as: LASIX  Take 2 tablets (40 mg total) by mouth daily. What changed: how much to take   gabapentin  100 MG capsule Commonly known as: NEURONTIN  Take 100 mg by mouth in the morning and at bedtime.   glipiZIDE  10 MG tablet Commonly known as: GLUCOTROL  Take 1 tablet (10 mg total) by mouth daily before breakfast.   guaiFENesin  600 MG 12 hr tablet Commonly known as: MUCINEX  Take 600 mg by mouth 2 (two) times daily.   ipratropium-albuterol  0.5-2.5 (3) MG/3ML Soln Commonly known as: DUONEB Take 3 mLs by nebulization 3 (three) times daily after meals.   melatonin 3 MG Tabs tablet Take 1 tablet (3 mg total) by mouth at bedtime as needed.   metFORMIN  1000 MG tablet Commonly known as: GLUCOPHAGE  Take 1,000 mg by mouth daily with breakfast. What changed: Another medication with the same name was removed. Continue taking this medication, and follow the directions you see here.   nystatin ointment Commonly known as: MYCOSTATIN Apply 1 Application topically 2 (two) times daily as needed (for irritation- a thin layer to the groin area).   oxyCODONE  5 MG immediate release tablet Commonly known as: Oxy IR/ROXICODONE  Take 1 tablet (5 mg total) by mouth every 4 (four) hours  as needed for moderate pain (pain score 4-6).   Pataday  0.2 % Soln Generic drug: Olopatadine  HCl Place 1 drop into both eyes in the morning.   polyethylene glycol 17 g packet Commonly known as: MIRALAX  / GLYCOLAX  Take 17 g by mouth See admin instructions. Mix 17 grams into 4-6 ounces of a beverage and drink by mouth every other day   potassium chloride  10 MEQ tablet Commonly known as: KLOR-CON  M Take 1 tablet (10 mEq total) by mouth daily.   predniSONE  10 MG tablet Commonly known as: DELTASONE  Take 3 tablets (30 mg total) by mouth daily with breakfast for 3 days. Start taking on: January 19, 2024   ramelteon  8 MG tablet Commonly known as: ROZEREM  Take 8 mg by mouth at bedtime.   Semaglutide  7 MG Tabs Take 7 mg by mouth daily.   sodium chloride  0.65 % nasal spray Commonly known as: OCEAN Place 1 spray into the nose 3 (three) times daily.   Stiolto Respimat  2.5-2.5 MCG/ACT Aers Generic drug: Tiotropium Bromide-Olodaterol Inhale 2 puffs into the lungs in the morning.   Systane 0.4-0.3 % Soln Generic drug:  Polyethyl Glycol-Propyl Glycol Place 1 drop into both eyes as needed (for dryness and irritation).   traZODone  100 MG tablet Commonly known as: DESYREL  Take 200 mg by mouth at bedtime.   traZODone  50 MG tablet Commonly known as: DESYREL  Take 25 mg by mouth at bedtime.   Trelegy Ellipta  200-62.5-25 MCG/ACT Aepb Generic drug: Fluticasone -Umeclidin-Vilant Inhale 1 puff into the lungs daily.       Consultations: Cardiology Pulmonary   Procedures/Studies:  US  Abdomen Limited RUQ (LIVER/GB) Result Date: 01/13/2024 EXAM: Right Upper Quadrant Abdominal Ultrasound 01/13/2024 11:14:06 PM TECHNIQUE: Real-time ultrasonography of the right upper quadrant of the abdomen was performed. COMPARISON: CT angiogram chest same day. CT abdomen and pelvis 03/25/2018. CLINICAL HISTORY: Dilated bile duct. FINDINGS: LIVER: Liver has a diffusely heterogeneous echotexture and increased  echogenicity. There is marked intrahepatic biliary ductal dilatation. No evidence of mass. Hepatopetal flow in the portal vein. BILIARY SYSTEM: The gallbladder is dilated. Gallbladder calculi are present measuring up to 7 mm. Sonographic Beverley sign is negative. The common bile duct is dilated measuring 13.9 mm. No pericholecystic fluid or wall thickening. RIGHT KIDNEY: No hydronephrosis. No echogenic calculi. No mass. PANCREAS: Visualized portions of the pancreas are unremarkable. OTHER: No right upper quadrant ascites. IMPRESSION: 1. Dilated gallbladder with cholelithiasis measuring up to 7 mm and negative sonographic Murphy sign. 2. Marked intrahepatic biliary ductal dilatation and dilated common bile duct measuring 13.9 mm. Findings are concerning for distal biliary obstruction. Further evaluation with MRCP or ERCP is recommended 3. Diffusely heterogeneous and echogenic liver, which can be seen with hepatic steatosis or other diffuse hepatocellular disease. Electronically signed by: Greig Pique MD 01/13/2024 11:33 PM EST RP Workstation: [id number]   CT Angio Chest Pulmonary Embolism (PE) W or WO Contrast Result Date: 01/13/2024 CLINICAL DATA:  Hypoxia and altered mental status EXAM: CT ANGIOGRAPHY CHEST WITH CONTRAST TECHNIQUE: Multidetector CT imaging of the chest was performed using the standard protocol during bolus administration of intravenous contrast. Multiplanar CT image reconstructions and MIPs were obtained to evaluate the vascular anatomy. RADIATION DOSE REDUCTION: This exam was performed according to the departmental dose-optimization program which includes automated exposure control, adjustment of the mA and/or kV according to patient size and/or use of iterative reconstruction technique. CONTRAST:  75mL OMNIPAQUE  IOHEXOL  350 MG/ML SOLN COMPARISON:  CT chest dated 01/10/2024 FINDINGS: Decreased sensitivity and specificity for detailed findings due to motion artifact. Cardiovascular: The study is  adequate for the evaluation of pulmonary embolism. There are no filling defects in the central, lobar, segmental or subsegmental pulmonary artery branches to suggest acute pulmonary embolism. Great vessels are normal in course and caliber. Normal heart size. No significant pericardial fluid/thickening. Coronary artery calcifications and aortic atherosclerosis. Mediastinum/Nodes: Imaged thyroid  gland without nodules meeting criteria for imaging follow-up by size. Normal esophagus. No pathologically enlarged axillary, supraclavicular, mediastinal, or hilar lymph nodes. Lungs/Pleura: The central airways are patent. Imaging in the expiratory phase. Moderate paraseptal and centrilobular emphysema. Secretions within the distal main bronchi bilaterally. Increased ground-glass opacity and consolidation in the lower lobes bilaterally. No pneumothorax. No pleural effusion. Upper abdomen: Severe intrahepatic bile duct dilation with imaged upstream common bile duct measuring up to 15 mm. Partially imaged distended gallbladder appears to contain layering stones. No main ductal dilation in the imaged pancreatic body or tail. Musculoskeletal: No acute or abnormal lytic or blastic osseous lesions. Multilevel degenerative changes of the thoracic spine. Review of the MIP images confirms the above findings. IMPRESSION: 1. No evidence of pulmonary embolism. 2. Increased ground-glass opacity  and consolidation in the lower lobes bilaterally, which may represent a combination of atelectasis and aspiration/pneumonia. 3. Severe intrahepatic bile duct dilation with imaged upstream common bile duct measuring up to 15 mm. Partially imaged distended gallbladder appears to contain layering stones. Recommend correlation with liver function tests and right upper quadrant ultrasound examination. Consider MRCP when the patient is better able to follow breath holding instructions. Electronically Signed   By: Limin  Xu M.D.   On: 01/13/2024 13:52    CT CHEST WO CONTRAST Result Date: 01/11/2024 EXAM: CT CHEST WITHOUT CONTRAST 01/10/2024 03:31:00 PM TECHNIQUE: CT of the chest was performed without the administration of intravenous contrast. Multiplanar reformatted images are provided for review. Automated exposure control, iterative reconstruction, and/or weight based adjustment of the mA/kV was utilized to reduce the radiation dose to as low as reasonably achievable. COMPARISON: Chest radiograph 01/07/2024. CLINICAL HISTORY: Pneumonia, complication suspected, xray done; high oxygen requirement, not improving, assess for complication. FINDINGS: MEDIASTINUM: Aortic atherosclerosis and multivessel coronary artery calcifications. The central airways are clear. LYMPH NODES: No mediastinal, hilar or axillary lymphadenopathy. LUNGS AND PLEURA: Emphysema, severe. Diffuse bronchial wall thickening. Interstitial edema or consolidative change. No pleural fluid. No pneumothorax identified. Subpleural nodule within the paramediastinal region and right upper lobe measures 1.1 x 0.8 cm (mean diameter 9 mm), image 59/6. Unchanged compared 07/15/2018. The superior segment left lower lobe lung nodule measures 4 mm and is also unchanged from 2020. Additional small lung nodules are also stable in the interval. SOFT TISSUES/BONES: Mild multilevel endplate degenerative changes within the thoracic spine. UPPER ABDOMEN: Common bile duct dilatation is identified measuring up to 1.7 cm, previously 1.3 cm. Intrahepatic bile duct dilatation is noted. The distal common bile duct is not visualized. IMPRESSION: 1. No acute intrathoracic abnormality, without pleural effusion, pneumothorax, or focal consolidation. 2. Severe emphysema with diffuse bronchial wall thickening. 3. Scattered bilateral lung nodules, stable from 07/15/2018, compatible with a benign process. 4. Aortic atherosclerosis and coronary artery calcifications. Electronically signed by: Waddell Calk MD 01/11/2024 05:44 AM  EST RP Workstation: [id number]   ECHOCARDIOGRAM COMPLETE Result Date: 01/07/2024    ECHOCARDIOGRAM REPORT   Patient Name:   Anthony [name]. Date of Exam: 01/07/2024 Medical Rec #:  [id number]          Height:       69.0 in Accession #:    [id number]         Weight:       170.9 lb Date of Birth:  October 26, 1950          BSA:          1.932 m Patient Age:    73 years           BP:           122/75 mmHg Patient Gender: M                  HR:           56 bpm. Exam Location:  Inpatient Procedure: 2D Echo, Cardiac Doppler and Color Doppler (Both Spectral and Color            Flow Doppler were utilized during procedure). Indications:    CHF I50.31  History:        Patient has prior history of Echocardiogram examinations, most                 recent 07/26/2023. CHF.  Sonographer:    Nathanel Devonshire Referring Phys: [id number] ROBERT DORRELL  IMPRESSIONS  1. Left ventricular ejection fraction, by estimation, is 50 to 55%. The left ventricle has low normal function. The left ventricle has no regional wall motion abnormalities. There is mild concentric left ventricular hypertrophy. Left ventricular diastolic parameters are consistent with Grade I diastolic dysfunction (impaired relaxation).  2. Right ventricular systolic function is normal. The right ventricular size is normal.  3. The mitral valve is normal in structure. No evidence of mitral valve regurgitation. No evidence of mitral stenosis.  4. The aortic valve is normal in structure. Aortic valve regurgitation is not visualized. No aortic stenosis is present.  5. The inferior vena cava is normal in size with greater than 50% respiratory variability, suggesting right atrial pressure of 3 mmHg. FINDINGS  Left Ventricle: Left ventricular ejection fraction, by estimation, is 50 to 55%. The left ventricle has low normal function. The left ventricle has no regional wall motion abnormalities. The left ventricular internal cavity size was normal in size. There is mild concentric left  ventricular hypertrophy. Left ventricular diastolic parameters are consistent with Grade I diastolic dysfunction (impaired relaxation). Right Ventricle: The right ventricular size is normal. No increase in right ventricular wall thickness. Right ventricular systolic function is normal. Left Atrium: Left atrial size was normal in size. Right Atrium: Right atrial size was normal in size. Pericardium: There is no evidence of pericardial effusion. Mitral Valve: The mitral valve is normal in structure. No evidence of mitral valve regurgitation. No evidence of mitral valve stenosis. Tricuspid Valve: The tricuspid valve is normal in structure. Tricuspid valve regurgitation is not demonstrated. No evidence of tricuspid stenosis. Aortic Valve: The aortic valve is normal in structure. Aortic valve regurgitation is not visualized. No aortic stenosis is present. Aortic valve mean gradient measures 3.0 mmHg. Aortic valve peak gradient measures 8.3 mmHg. Aortic valve area, by VTI measures 2.23 cm. Pulmonic Valve: The pulmonic valve was normal in structure. Pulmonic valve regurgitation is not visualized. No evidence of pulmonic stenosis. Aorta: The aortic root is normal in size and structure. Venous: The inferior vena cava is normal in size with greater than 50% respiratory variability, suggesting right atrial pressure of 3 mmHg. IAS/Shunts: No atrial level shunt detected by color flow Doppler.  LEFT VENTRICLE PLAX 2D LVIDd:         5.50 cm     Diastology LVIDs:         3.90 cm     LV e' medial:    4.13 cm/s LV PW:         1.10 cm     LV E/e' medial:  10.4 LV IVS:        1.00 cm     LV e' lateral:   4.90 cm/s LVOT diam:     2.00 cm     LV E/e' lateral: 8.7 LV SV:         53 LV SV Index:   27 LVOT Area:     3.14 cm LV IVRT:       106 msec  LV Volumes (MOD) LV vol d, MOD A2C: 81.9 ml LV vol d, MOD A4C: 76.4 ml LV vol s, MOD A2C: 39.5 ml LV vol s, MOD A4C: 34.3 ml LV SV MOD A2C:     42.4 ml LV SV MOD A4C:     76.4 ml LV SV MOD BP:       43.0 ml RIGHT VENTRICLE          IVC RV Basal diam:  3.60 cm  IVC diam: 1.80 cm TAPSE (M-mode): 2.7 cm                          PULMONARY VEINS                          Diastolic Velocity: 23.40 cm/s                          S/D Velocity:       1.80                          Systolic Velocity:  41.80 cm/s LEFT ATRIUM             Index        RIGHT ATRIUM           Index LA diam:        3.70 cm 1.91 cm/m   RA Area:     16.40 cm LA Vol (A2C):   55.0 ml 28.46 ml/m  RA Volume:   38.50 ml  19.93 ml/m LA Vol (A4C):   37.7 ml 19.51 ml/m LA Biplane Vol: 49.0 ml 25.36 ml/m  AORTIC VALVE                    PULMONIC VALVE AV Area (Vmax):    1.67 cm     PV Vmax:       0.84 m/s AV Area (Vmean):   1.86 cm     PV Peak grad:  2.8 mmHg AV Area (VTI):     2.23 cm AV Vmax:           144.00 cm/s AV Vmean:          81.600 cm/s AV VTI:            0.237 m AV Peak Grad:      8.3 mmHg AV Mean Grad:      3.0 mmHg LVOT Vmax:         76.40 cm/s LVOT Vmean:        48.400 cm/s LVOT VTI:          0.168 m LVOT/AV VTI ratio: 0.71  AORTA Ao Root diam: 3.40 cm Ao Asc diam:  3.50 cm MITRAL VALVE MV Area (PHT): 2.32 cm    SHUNTS MV E velocity: 42.80 cm/s  Systemic VTI:  0.17 m MV A velocity: 66.00 cm/s  Systemic Diam: 2.00 cm MV E/A ratio:  0.65 Morene Brownie Electronically signed by Morene Brownie Signature Date/Time: 01/07/2024/10:31:23 AM    Final    DG Chest 2 View Result Date: 01/07/2024 EXAM: 2 VIEW(S) XRAY OF THE CHEST 01/07/2024 12:38:50 AM COMPARISON: 08/11/2023 CLINICAL HISTORY: SOB FINDINGS: LUNGS AND PLEURA: Pulmonary interstitial prominence with mild pulmonary edema. Small left pleural effusion. Left basilar consolidation. No pneumothorax. HEART AND MEDIASTINUM: Cardiomegaly. Calcified aorta. BONES AND SOFT TISSUES: Multilevel degenerative changes of thoracic spine. IMPRESSION: 1. Pulmonary interstitial prominence with mild pulmonary edema. 2. Small left pleural effusion. 3. Left basilar consolidation. 4. Cardiomegaly.  Electronically signed by: Franky Stanford MD 01/07/2024 02:43 AM EST RP Workstation: [id number]     Subjective: - no chest pain, shortness of breath, no abdominal pain, nausea or vomiting.   Discharge Exam: BP 135/78 (BP Location: Right Arm)   Pulse (!) 54   Temp (!) 97.4 F (36.3 C) (Oral)   Resp  20   Ht 5' 8 (1.727 m)   Wt 84.8 kg   SpO2 (!) 88%   BMI 28.43 kg/m   General: Pt is alert, awake, not in acute distress Cardiovascular: RRR, S1/S2 +, no rubs, no gallops Respiratory: CTA bilaterally, no wheezing, no rhonchi Abdominal: Soft, NT, ND, bowel sounds + Extremities: no edema, no cyanosis    The results of significant diagnostics from this hospitalization (including imaging, microbiology, ancillary and laboratory) are listed below for reference.     Microbiology: No results found for this or any previous visit (from the past 240 hours).   Labs: Basic Metabolic Panel: Recent Labs  Lab 01/14/24 0523 01/15/24 0527  NA 138 140  K 4.2 4.1  CL 99 99  CO2 30 31  GLUCOSE 180* 180*  BUN 35* 29*  CREATININE 1.14 1.04  CALCIUM  9.4 9.6   Liver Function Tests: Recent Labs  Lab 01/14/24 0523 01/15/24 0527  AST 21 16  ALT 20 20  ALKPHOS 49 52  BILITOT 0.4 0.6  PROT 6.6 7.1  ALBUMIN  3.8 4.0   CBC: No results for input(s): WBC, NEUTROABS, HGB, HCT, MCV, PLT in the last 168 hours. CBG: Recent Labs  Lab 01/17/24 0751 01/17/24 1124 01/17/24 1642 01/17/24 2058 01/18/24 0750  GLUCAP 190* 256* 230* 407* 172*   Hgb A1c No results for input(s): HGBA1C in the last 72 hours. Lipid Profile No results for input(s): CHOL, HDL, LDLCALC, TRIG, CHOLHDL, LDLDIRECT in the last 72 hours. Thyroid  function studies No results for input(s): TSH, T4TOTAL, T3FREE, THYROIDAB in the last 72 hours.  Invalid input(s): FREET3 Urinalysis    Component Value Date/Time   COLORURINE YELLOW 01/07/2024 0445   APPEARANCEUR CLEAR 01/07/2024 0445    LABSPEC 1.009 01/07/2024 0445   PHURINE 5.0 01/07/2024 0445   GLUCOSEU >=500 (A) 01/07/2024 0445   HGBUR NEGATIVE 01/07/2024 0445   BILIRUBINUR NEGATIVE 01/07/2024 0445   KETONESUR NEGATIVE 01/07/2024 0445   PROTEINUR NEGATIVE 01/07/2024 0445   UROBILINOGEN 0.2 05/05/2013 0633   NITRITE NEGATIVE 01/07/2024 0445   LEUKOCYTESUR NEGATIVE 01/07/2024 0445    FURTHER DISCHARGE INSTRUCTIONS:   Get Medicines reviewed and adjusted: Please take all your medications with you for your next visit with your Primary MD   Laboratory/radiological data: Please request your Primary MD to go over all hospital tests and procedure/radiological results at the follow up, please ask your Primary MD to get all Hospital records sent to his/her office.   In some cases, they will be blood work, cultures and biopsy results pending at the time of your discharge. Please request that your primary care M.D. goes through all the records of your hospital data and follows up on these results.   Also Note the following: If you experience worsening of your admission symptoms, develop shortness of breath, life threatening emergency, suicidal or homicidal thoughts you must seek medical attention immediately by calling 911 or calling your MD immediately  if symptoms less severe.   You must read complete instructions/literature along with all the possible adverse reactions/side effects for all the Medicines you take and that have been prescribed to you. Take any new Medicines after you have completely understood and accpet all the possible adverse reactions/side effects.    Do not drive when taking Pain medications or sleeping medications (Benzodaizepines)   Do not take more than prescribed Pain, Sleep and Anxiety Medications. It is not advisable to combine anxiety,sleep and pain medications without talking with your primary care practitioner   Special  Instructions: If you have smoked or chewed Tobacco  in the last 2 yrs please  stop smoking, stop any regular Alcohol   and or any Recreational drug use.   Wear Seat belts while driving.   Please note: You were cared for by a hospitalist during your hospital stay. Once you are discharged, your primary care physician will handle any further medical issues. Please note that NO REFILLS for any discharge medications will be authorized once you are discharged, as it is imperative that you return to your primary care physician (or establish a relationship with a primary care physician if you do not have one) for your post hospital discharge needs so that they can reassess your need for medications and monitor your lab values.  Time coordinating discharge: 35 minutes  SIGNED:  Nilda Fendt, MD, PhD 01/18/2024, 9:35 AM   "

## 2024-01-18 NOTE — TOC Transition Note (Signed)
 Transition of Care [facility]) - Discharge Note   Patient Details  Name: Anthony [name]. MRN: [id number] Date of Birth: 31-Jan-1950  Transition of Care [facility]) CM/SW Contact:  Sonda Manuella Quill, RN Phone Number: 01/18/2024, 11:54 AM   Clinical Narrative:    D/C orders received; pt from [facility]; he will return w/ [facility], and transport by [facility]; spoke w/ dtr [name] [phone]); she agreed to d/c plan; spoke w/ [place] at facility; she gave RMN # [id number], call report # [phone]; D/C summary and SNF transfer report sent via hub; [facility] called for transport at 1159 ; spoke w/ operator # [phone]; no IP CM needs.   Final next level of care: Long Term Nursing Home Barriers to Discharge: No Barriers Identified   Patient Goals and CMS Choice Patient states their goals for this hospitalization and ongoing recovery are:: Return [facility] CMS Medicare.gov Compare Post Acute Care list provided to:: Patient Represenative (must comment) Choice offered to / list presented to : Adult Children [place] ownership interest in [facility].provided to:: Adult Children    Discharge Placement              Patient [facility] bed at: [facility] Patient to be transferred to facility by: [facility] Name of family member notified: [name] (dtr) [phone] Patient and family notified of of transfer: 01/18/24  Discharge Plan and Services Additional resources added to the After Visit Summary for   In-house Referral: Clinical Social Work Discharge Planning Services: CM Consult Post Acute Care Choice: Nursing Home          DME Arranged: N/A DME Agency: NA       HH Arranged: NA HH Agency: NA        Social Drivers of Health (SDOH) Interventions SDOH Screenings   Food Insecurity: Patient Unable To Answer (01/08/2024)  Housing: Unknown (01/08/2024)  Transportation Needs: Patient Unable To Answer (01/08/2024)  Utilities: Patient Unable To Answer  (01/08/2024)  Social Connections: Unknown (01/08/2024)  Tobacco Use: Medium Risk (01/07/2024)     Readmission Risk Interventions    01/12/2024    2:21 PM 08/15/2023   11:13 AM  Readmission Risk Prevention Plan  Transportation Screening Complete Complete  PCP or Specialist Appt within 3-5 Days Complete   HRI or Home Care Consult Complete   Social Work Consult for Recovery Care Planning/Counseling Complete   Palliative Care Screening Complete   Medication Review Oceanographer) Complete   SW Recovery Care/Counseling Consult  Complete  Palliative Care Screening  Complete  Skilled Nursing Facility  Complete

## 2024-02-26 ENCOUNTER — Ambulatory Visit: Admitting: Physician Assistant
